# Patient Record
Sex: Female | Born: 1950 | Race: White | Hispanic: No | Marital: Married | Smoking: Current some day smoker
Health system: Southern US, Community
[De-identification: ages and names within clinical notes are randomized; demographics above are authoritative.]

## PROBLEM LIST (undated history)

## (undated) DIAGNOSIS — J9601 Acute respiratory failure with hypoxia: Secondary | ICD-10-CM

## (undated) DIAGNOSIS — M329 Systemic lupus erythematosus, unspecified: Secondary | ICD-10-CM

## (undated) DIAGNOSIS — M542 Cervicalgia: Secondary | ICD-10-CM

## (undated) DIAGNOSIS — Z8679 Personal history of other diseases of the circulatory system: Secondary | ICD-10-CM

## (undated) DIAGNOSIS — K589 Irritable bowel syndrome without diarrhea: Secondary | ICD-10-CM

## (undated) DIAGNOSIS — K760 Fatty (change of) liver, not elsewhere classified: Secondary | ICD-10-CM

## (undated) DIAGNOSIS — K559 Vascular disorder of intestine, unspecified: Secondary | ICD-10-CM

## (undated) DIAGNOSIS — I4891 Unspecified atrial fibrillation: Secondary | ICD-10-CM

## (undated) DIAGNOSIS — J449 Chronic obstructive pulmonary disease, unspecified: Secondary | ICD-10-CM

## (undated) DIAGNOSIS — K644 Residual hemorrhoidal skin tags: Secondary | ICD-10-CM

## (undated) DIAGNOSIS — M1990 Unspecified osteoarthritis, unspecified site: Secondary | ICD-10-CM

## (undated) DIAGNOSIS — I519 Heart disease, unspecified: Secondary | ICD-10-CM

## (undated) DIAGNOSIS — D126 Benign neoplasm of colon, unspecified: Secondary | ICD-10-CM

## (undated) DIAGNOSIS — Z86010 Personal history of colonic polyps: Secondary | ICD-10-CM

## (undated) DIAGNOSIS — I509 Heart failure, unspecified: Secondary | ICD-10-CM

## (undated) DIAGNOSIS — G4730 Sleep apnea, unspecified: Secondary | ICD-10-CM

## (undated) DIAGNOSIS — M810 Age-related osteoporosis without current pathological fracture: Secondary | ICD-10-CM

## (undated) DIAGNOSIS — Z950 Presence of cardiac pacemaker: Secondary | ICD-10-CM

## (undated) DIAGNOSIS — G2581 Restless legs syndrome: Secondary | ICD-10-CM

## (undated) DIAGNOSIS — I83813 Varicose veins of bilateral lower extremities with pain: Secondary | ICD-10-CM

## (undated) HISTORY — DX: Sleep apnea, unspecified: G47.30

## (undated) HISTORY — DX: Benign neoplasm of colon, unspecified: D12.6

## (undated) HISTORY — DX: Age-related osteoporosis without current pathological fracture: M81.0

## (undated) HISTORY — DX: Vascular disorder of intestine, unspecified: K55.9

## (undated) HISTORY — DX: Systemic lupus erythematosus, unspecified: M32.9

## (undated) HISTORY — PX: TONSILLECTOMY: SUR1361

## (undated) HISTORY — DX: Restless legs syndrome: G25.81

## (undated) HISTORY — DX: Fatty (change of) liver, not elsewhere classified: K76.0

## (undated) HISTORY — PX: CHOLECYSTECTOMY, LAPAROSCOPIC: SHX56

## (undated) HISTORY — PX: CHOLECYSTECTOMY: SHX55

## (undated) HISTORY — DX: Personal history of other diseases of the circulatory system: Z86.79

## (undated) HISTORY — PX: OTHER SURGICAL HISTORY: SHX169

## (undated) HISTORY — DX: Residual hemorrhoidal skin tags: K64.4

## (undated) HISTORY — DX: Irritable bowel syndrome, unspecified: K58.9

## (undated) HISTORY — DX: Heart disease, unspecified: I51.9

## (undated) HISTORY — PX: ESOPHAGOGASTRODUODENOSCOPY: SHX1529

## (undated) HISTORY — DX: Personal history of colonic polyps: Z86.010

## (undated) HISTORY — PX: COLONOSCOPY: SHX174

---
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
## 1975-04-23 HISTORY — PX: TUBAL LIGATION: SHX77

## 1999-07-16 ENCOUNTER — Other Ambulatory Visit: Admission: RE | Admit: 1999-07-16 | Discharge: 1999-07-16 | Payer: Self-pay | Admitting: Obstetrics and Gynecology

## 1999-10-11 ENCOUNTER — Other Ambulatory Visit: Admission: RE | Admit: 1999-10-11 | Discharge: 1999-10-11 | Payer: Self-pay | Admitting: Obstetrics and Gynecology

## 1999-10-26 ENCOUNTER — Encounter (INDEPENDENT_AMBULATORY_CARE_PROVIDER_SITE_OTHER): Payer: Self-pay

## 1999-10-26 ENCOUNTER — Ambulatory Visit (HOSPITAL_COMMUNITY): Admission: RE | Admit: 1999-10-26 | Discharge: 1999-10-26 | Payer: Self-pay | Admitting: Obstetrics and Gynecology

## 2000-07-25 ENCOUNTER — Other Ambulatory Visit: Admission: RE | Admit: 2000-07-25 | Discharge: 2000-07-25 | Payer: Self-pay | Admitting: Obstetrics and Gynecology

## 2001-07-25 ENCOUNTER — Emergency Department (HOSPITAL_COMMUNITY): Admission: EM | Admit: 2001-07-25 | Discharge: 2001-07-25 | Payer: Self-pay | Admitting: Emergency Medicine

## 2001-09-25 ENCOUNTER — Other Ambulatory Visit: Admission: RE | Admit: 2001-09-25 | Discharge: 2001-09-25 | Payer: Self-pay | Admitting: Obstetrics and Gynecology

## 2002-06-06 ENCOUNTER — Inpatient Hospital Stay (HOSPITAL_COMMUNITY): Admission: EM | Admit: 2002-06-06 | Discharge: 2002-06-08 | Payer: Self-pay | Admitting: *Deleted

## 2002-06-07 ENCOUNTER — Encounter (INDEPENDENT_AMBULATORY_CARE_PROVIDER_SITE_OTHER): Payer: Self-pay | Admitting: *Deleted

## 2002-07-19 ENCOUNTER — Ambulatory Visit: Admission: RE | Admit: 2002-07-19 | Discharge: 2002-07-19 | Payer: Self-pay | Admitting: Internal Medicine

## 2002-07-19 ENCOUNTER — Encounter: Payer: Self-pay | Admitting: Internal Medicine

## 2002-10-06 ENCOUNTER — Other Ambulatory Visit: Admission: RE | Admit: 2002-10-06 | Discharge: 2002-10-06 | Payer: Self-pay | Admitting: Obstetrics and Gynecology

## 2003-10-11 ENCOUNTER — Other Ambulatory Visit: Admission: RE | Admit: 2003-10-11 | Discharge: 2003-10-11 | Payer: Self-pay | Admitting: Obstetrics and Gynecology

## 2004-03-01 ENCOUNTER — Ambulatory Visit: Payer: Self-pay | Admitting: Internal Medicine

## 2004-03-13 ENCOUNTER — Ambulatory Visit: Payer: Self-pay | Admitting: Internal Medicine

## 2004-03-21 ENCOUNTER — Encounter (INDEPENDENT_AMBULATORY_CARE_PROVIDER_SITE_OTHER): Payer: Self-pay | Admitting: *Deleted

## 2004-03-21 ENCOUNTER — Ambulatory Visit (HOSPITAL_COMMUNITY): Admission: RE | Admit: 2004-03-21 | Discharge: 2004-03-21 | Payer: Self-pay | Admitting: Internal Medicine

## 2004-03-21 ENCOUNTER — Ambulatory Visit: Payer: Self-pay | Admitting: Internal Medicine

## 2004-04-11 ENCOUNTER — Encounter (INDEPENDENT_AMBULATORY_CARE_PROVIDER_SITE_OTHER): Payer: Self-pay | Admitting: *Deleted

## 2004-04-11 ENCOUNTER — Observation Stay (HOSPITAL_COMMUNITY): Admission: RE | Admit: 2004-04-11 | Discharge: 2004-04-12 | Payer: Self-pay | Admitting: General Surgery

## 2004-04-17 ENCOUNTER — Ambulatory Visit: Payer: Self-pay | Admitting: Internal Medicine

## 2004-05-18 ENCOUNTER — Ambulatory Visit: Payer: Self-pay | Admitting: Internal Medicine

## 2004-06-04 ENCOUNTER — Ambulatory Visit: Payer: Self-pay | Admitting: Internal Medicine

## 2004-07-16 ENCOUNTER — Ambulatory Visit: Payer: Self-pay | Admitting: Internal Medicine

## 2004-10-11 ENCOUNTER — Other Ambulatory Visit: Admission: RE | Admit: 2004-10-11 | Discharge: 2004-10-11 | Payer: Self-pay | Admitting: Obstetrics and Gynecology

## 2004-11-01 ENCOUNTER — Ambulatory Visit: Payer: Self-pay | Admitting: Internal Medicine

## 2005-03-21 ENCOUNTER — Ambulatory Visit: Payer: Self-pay | Admitting: Internal Medicine

## 2005-06-26 ENCOUNTER — Ambulatory Visit: Payer: Self-pay | Admitting: Internal Medicine

## 2005-07-29 ENCOUNTER — Ambulatory Visit: Payer: Self-pay | Admitting: Internal Medicine

## 2005-09-23 ENCOUNTER — Ambulatory Visit: Payer: Self-pay | Admitting: Internal Medicine

## 2005-11-11 ENCOUNTER — Other Ambulatory Visit: Admission: RE | Admit: 2005-11-11 | Discharge: 2005-11-11 | Payer: Self-pay | Admitting: Obstetrics and Gynecology

## 2005-12-26 ENCOUNTER — Ambulatory Visit: Payer: Self-pay | Admitting: Internal Medicine

## 2006-01-31 ENCOUNTER — Ambulatory Visit: Payer: Self-pay | Admitting: Internal Medicine

## 2006-05-20 ENCOUNTER — Ambulatory Visit: Payer: Self-pay | Admitting: Internal Medicine

## 2006-11-14 ENCOUNTER — Other Ambulatory Visit: Admission: RE | Admit: 2006-11-14 | Discharge: 2006-11-14 | Payer: Self-pay | Admitting: Obstetrics and Gynecology

## 2006-11-20 ENCOUNTER — Ambulatory Visit: Payer: Self-pay | Admitting: Internal Medicine

## 2006-11-21 LAB — CONVERTED CEMR LAB
Cholesterol: 152 mg/dL (ref 0–200)
HDL: 51.7 mg/dL (ref >39.0)
LDL Cholesterol: 84 mg/dL (ref 0–99)
TSH: 1.25 microintl units/mL (ref 0.35–5.50)
Total CHOL/HDL Ratio: 2.9
Triglycerides: 82 mg/dL (ref 0–149)
VLDL: 16 mg/dL (ref 0–40)

## 2007-01-17 ENCOUNTER — Encounter: Payer: Self-pay | Admitting: *Deleted

## 2007-01-17 DIAGNOSIS — Z8679 Personal history of other diseases of the circulatory system: Secondary | ICD-10-CM | POA: Insufficient documentation

## 2007-01-17 DIAGNOSIS — Z9089 Acquired absence of other organs: Secondary | ICD-10-CM | POA: Insufficient documentation

## 2007-01-29 ENCOUNTER — Ambulatory Visit: Payer: Self-pay | Admitting: Internal Medicine

## 2007-04-29 ENCOUNTER — Telehealth: Payer: Self-pay | Admitting: Internal Medicine

## 2007-06-21 LAB — CONVERTED CEMR LAB: Pap Smear: NORMAL

## 2007-06-30 ENCOUNTER — Ambulatory Visit: Payer: Self-pay | Admitting: Internal Medicine

## 2007-07-14 ENCOUNTER — Ambulatory Visit: Payer: Self-pay | Admitting: Internal Medicine

## 2007-07-14 ENCOUNTER — Encounter: Payer: Self-pay | Admitting: Internal Medicine

## 2007-07-27 ENCOUNTER — Ambulatory Visit: Payer: Self-pay | Admitting: Internal Medicine

## 2007-07-27 DIAGNOSIS — G609 Hereditary and idiopathic neuropathy, unspecified: Secondary | ICD-10-CM | POA: Insufficient documentation

## 2007-07-28 ENCOUNTER — Encounter (INDEPENDENT_AMBULATORY_CARE_PROVIDER_SITE_OTHER): Payer: Self-pay | Admitting: *Deleted

## 2007-08-25 ENCOUNTER — Encounter: Payer: Self-pay | Admitting: Internal Medicine

## 2007-09-17 ENCOUNTER — Telehealth: Payer: Self-pay | Admitting: Internal Medicine

## 2007-09-21 ENCOUNTER — Ambulatory Visit: Payer: Self-pay | Admitting: Internal Medicine

## 2007-11-03 ENCOUNTER — Telehealth: Payer: Self-pay | Admitting: Internal Medicine

## 2007-11-09 ENCOUNTER — Encounter: Payer: Self-pay | Admitting: Internal Medicine

## 2007-11-13 ENCOUNTER — Ambulatory Visit: Payer: Self-pay | Admitting: Internal Medicine

## 2007-11-17 ENCOUNTER — Telehealth: Payer: Self-pay | Admitting: Internal Medicine

## 2007-11-20 ENCOUNTER — Ambulatory Visit: Payer: Self-pay | Admitting: Internal Medicine

## 2007-11-23 ENCOUNTER — Encounter: Payer: Self-pay | Admitting: Internal Medicine

## 2007-11-23 ENCOUNTER — Ambulatory Visit: Payer: Self-pay | Admitting: Internal Medicine

## 2007-11-27 ENCOUNTER — Encounter: Payer: Self-pay | Admitting: Internal Medicine

## 2007-12-21 ENCOUNTER — Encounter: Payer: Self-pay | Admitting: Internal Medicine

## 2008-01-08 ENCOUNTER — Telehealth (INDEPENDENT_AMBULATORY_CARE_PROVIDER_SITE_OTHER): Payer: Self-pay | Admitting: *Deleted

## 2008-01-22 ENCOUNTER — Ambulatory Visit: Payer: Self-pay | Admitting: Internal Medicine

## 2008-03-25 ENCOUNTER — Ambulatory Visit: Payer: Self-pay | Admitting: Internal Medicine

## 2008-03-25 DIAGNOSIS — Z860100 Personal history of colon polyps, unspecified: Secondary | ICD-10-CM

## 2008-03-25 DIAGNOSIS — K589 Irritable bowel syndrome without diarrhea: Secondary | ICD-10-CM

## 2008-03-25 DIAGNOSIS — J069 Acute upper respiratory infection, unspecified: Secondary | ICD-10-CM | POA: Insufficient documentation

## 2008-03-25 HISTORY — DX: Personal history of colon polyps, unspecified: Z86.0100

## 2008-03-25 HISTORY — DX: Personal history of colonic polyps: Z86.010

## 2008-03-28 ENCOUNTER — Telehealth: Payer: Self-pay | Admitting: Internal Medicine

## 2008-04-11 ENCOUNTER — Ambulatory Visit: Payer: Self-pay | Admitting: Internal Medicine

## 2008-04-18 ENCOUNTER — Telehealth: Payer: Self-pay | Admitting: Internal Medicine

## 2008-04-29 ENCOUNTER — Telehealth: Payer: Self-pay | Admitting: Internal Medicine

## 2008-05-10 ENCOUNTER — Telehealth: Payer: Self-pay | Admitting: Internal Medicine

## 2008-05-30 ENCOUNTER — Telehealth: Payer: Self-pay | Admitting: Internal Medicine

## 2008-05-31 ENCOUNTER — Ambulatory Visit: Payer: Self-pay | Admitting: Internal Medicine

## 2008-05-31 DIAGNOSIS — R21 Rash and other nonspecific skin eruption: Secondary | ICD-10-CM | POA: Insufficient documentation

## 2008-05-31 DIAGNOSIS — M549 Dorsalgia, unspecified: Secondary | ICD-10-CM | POA: Insufficient documentation

## 2008-06-02 ENCOUNTER — Telehealth (INDEPENDENT_AMBULATORY_CARE_PROVIDER_SITE_OTHER): Payer: Self-pay | Admitting: *Deleted

## 2008-06-03 ENCOUNTER — Encounter: Admission: RE | Admit: 2008-06-03 | Discharge: 2008-06-03 | Payer: Self-pay | Admitting: Internal Medicine

## 2008-06-03 ENCOUNTER — Encounter: Payer: Self-pay | Admitting: Internal Medicine

## 2008-06-03 DIAGNOSIS — R9389 Abnormal findings on diagnostic imaging of other specified body structures: Secondary | ICD-10-CM

## 2008-06-07 ENCOUNTER — Ambulatory Visit: Payer: Self-pay | Admitting: Oncology

## 2008-06-16 ENCOUNTER — Telehealth (INDEPENDENT_AMBULATORY_CARE_PROVIDER_SITE_OTHER): Payer: Self-pay | Admitting: *Deleted

## 2008-06-24 ENCOUNTER — Encounter: Payer: Self-pay | Admitting: Internal Medicine

## 2008-06-24 LAB — CBC & DIFF AND RETIC
Basophils Absolute: 0 10*3/uL (ref 0.0–0.1)
Eosinophils Absolute: 0 10*3/uL (ref 0.0–0.5)
HCT: 36.5 % (ref 34.8–46.6)
HGB: 12.5 g/dL (ref 11.6–15.9)
IRF: 0.27 (ref 0.130–0.330)
MCH: 30.8 pg (ref 25.1–34.0)
MONO#: 0.6 10*3/uL (ref 0.1–0.9)
NEUT%: 57.3 % (ref 38.4–76.8)
WBC: 5.5 10*3/uL (ref 3.9–10.3)
lymph#: 1.7 10*3/uL (ref 0.9–3.3)

## 2008-06-24 LAB — LACTATE DEHYDROGENASE: LDH: 146 U/L (ref 94–250)

## 2008-06-24 LAB — CHCC SMEAR

## 2008-06-24 LAB — COMPREHENSIVE METABOLIC PANEL
ALT: 20 U/L (ref 0–35)
CO2: 29 mEq/L (ref 19–32)
Calcium: 9.2 mg/dL (ref 8.4–10.5)
Chloride: 102 mEq/L (ref 96–112)
Potassium: 3.8 mEq/L (ref 3.5–5.3)
Sodium: 137 mEq/L (ref 135–145)
Total Protein: 7.2 g/dL (ref 6.0–8.3)

## 2008-07-04 ENCOUNTER — Ambulatory Visit (HOSPITAL_COMMUNITY): Admission: RE | Admit: 2008-07-04 | Discharge: 2008-07-04 | Payer: Self-pay | Admitting: Oncology

## 2008-09-21 ENCOUNTER — Ambulatory Visit: Payer: Self-pay | Admitting: Oncology

## 2008-09-23 ENCOUNTER — Encounter: Payer: Self-pay | Admitting: Internal Medicine

## 2008-09-23 LAB — COMPREHENSIVE METABOLIC PANEL
ALT: 22 U/L (ref 0–35)
AST: 26 U/L (ref 0–37)
Albumin: 4.3 g/dL (ref 3.5–5.2)
Alkaline Phosphatase: 66 U/L (ref 39–117)
BUN: 12 mg/dL (ref 6–23)
CO2: 27 mEq/L (ref 19–32)
Calcium: 9.4 mg/dL (ref 8.4–10.5)
Chloride: 103 mEq/L (ref 96–112)
Creatinine, Ser: 0.74 mg/dL (ref 0.40–1.20)
Glucose, Bld: 98 mg/dL (ref 70–99)
Potassium: 3.7 mEq/L (ref 3.5–5.3)
Sodium: 139 mEq/L (ref 135–145)
Total Bilirubin: 0.4 mg/dL (ref 0.3–1.2)
Total Protein: 7.5 g/dL (ref 6.0–8.3)

## 2008-09-23 LAB — CBC & DIFF AND RETIC
BASO%: 0.4 % (ref 0.0–2.0)
Basophils Absolute: 0 10*3/uL (ref 0.0–0.1)
EOS%: 1.2 % (ref 0.0–7.0)
Eosinophils Absolute: 0.1 10*3/uL (ref 0.0–0.5)
HCT: 36.7 % (ref 34.8–46.6)
HGB: 12.7 g/dL (ref 11.6–15.9)
IRF: 0.24 (ref 0.130–0.330)
LYMPH%: 33.2 % (ref 14.0–49.7)
MCH: 31.7 pg (ref 25.1–34.0)
MCHC: 34.6 g/dL (ref 31.5–36.0)
MCV: 91.8 fL (ref 79.5–101.0)
MONO#: 0.6 10*3/uL (ref 0.1–0.9)
MONO%: 9.8 % (ref 0.0–14.0)
NEUT#: 3.3 10*3/uL (ref 1.5–6.5)
NEUT%: 55.4 % (ref 38.4–76.8)
Platelets: 170 10*3/uL (ref 145–400)
RBC: 4 10*6/uL (ref 3.70–5.45)
RDW: 11.8 % (ref 11.2–14.5)
RETIC #: 48 10*3/uL (ref 19.7–115.1)
Retic %: 1.2 % (ref 0.4–2.3)
WBC: 5.9 10*3/uL (ref 3.9–10.3)
lymph#: 2 10*3/uL (ref 0.9–3.3)

## 2008-09-23 LAB — LACTATE DEHYDROGENASE: LDH: 178 U/L (ref 94–250)

## 2008-09-23 LAB — CHCC SMEAR

## 2008-09-23 LAB — ERYTHROCYTE SEDIMENTATION RATE: Sed Rate: 16 mm/hr (ref 0–30)

## 2008-12-27 ENCOUNTER — Encounter: Admission: RE | Admit: 2008-12-27 | Discharge: 2008-12-27 | Payer: Self-pay | Admitting: Oncology

## 2009-01-02 ENCOUNTER — Ambulatory Visit: Payer: Self-pay | Admitting: Internal Medicine

## 2009-01-02 DIAGNOSIS — J209 Acute bronchitis, unspecified: Secondary | ICD-10-CM

## 2009-01-02 DIAGNOSIS — Z720 Tobacco use: Secondary | ICD-10-CM | POA: Insufficient documentation

## 2009-01-02 DIAGNOSIS — F17200 Nicotine dependence, unspecified, uncomplicated: Secondary | ICD-10-CM

## 2009-01-04 ENCOUNTER — Telehealth: Payer: Self-pay | Admitting: Internal Medicine

## 2009-01-11 ENCOUNTER — Ambulatory Visit: Payer: Self-pay | Admitting: Oncology

## 2009-01-13 ENCOUNTER — Encounter: Payer: Self-pay | Admitting: Internal Medicine

## 2009-01-13 LAB — COMPREHENSIVE METABOLIC PANEL
ALT: 16 U/L (ref 0–35)
AST: 25 U/L (ref 0–37)
Albumin: 4.2 g/dL (ref 3.5–5.2)
Alkaline Phosphatase: 65 U/L (ref 39–117)
BUN: 13 mg/dL (ref 6–23)
CO2: 25 mEq/L (ref 19–32)
Calcium: 9.2 mg/dL (ref 8.4–10.5)
Chloride: 101 mEq/L (ref 96–112)
Creatinine, Ser: 0.67 mg/dL (ref 0.40–1.20)
Glucose, Bld: 99 mg/dL (ref 70–99)
Potassium: 4.8 mEq/L (ref 3.5–5.3)
Sodium: 133 mEq/L — ABNORMAL LOW (ref 135–145)
Total Bilirubin: 0.6 mg/dL (ref 0.3–1.2)
Total Protein: 7.1 g/dL (ref 6.0–8.3)

## 2009-01-13 LAB — LACTATE DEHYDROGENASE: LDH: 151 U/L (ref 94–250)

## 2009-01-13 LAB — CBC & DIFF AND RETIC
BASO%: 0.3 % (ref 0.0–2.0)
Basophils Absolute: 0 10*3/uL (ref 0.0–0.1)
EOS%: 1 % (ref 0.0–7.0)
Eosinophils Absolute: 0.1 10*3/uL (ref 0.0–0.5)
HCT: 37.5 % (ref 34.8–46.6)
HGB: 13.1 g/dL (ref 11.6–15.9)
Immature Retic Fract: 1.1 % (ref 0.00–10.70)
LYMPH%: 37.4 % (ref 14.0–49.7)
MCH: 30.1 pg (ref 25.1–34.0)
MCHC: 34.9 g/dL (ref 31.5–36.0)
MCV: 86.2 fL (ref 79.5–101.0)
MONO#: 0.7 10*3/uL (ref 0.1–0.9)
MONO%: 11.2 % (ref 0.0–14.0)
NEUT#: 3 10*3/uL (ref 1.5–6.5)
NEUT%: 50.1 % (ref 38.4–76.8)
Platelets: 181 10*3/uL (ref 145–400)
RBC: 4.35 10*6/uL (ref 3.70–5.45)
RDW: 12.2 % (ref 11.2–14.5)
Retic %: 0.53 % (ref 0.50–1.50)
Retic Ct Abs: 23.06 10*3/uL (ref 18.30–72.70)
WBC: 6 10*3/uL (ref 3.9–10.3)
lymph#: 2.2 10*3/uL (ref 0.9–3.3)

## 2009-01-13 LAB — ERYTHROCYTE SEDIMENTATION RATE: Sed Rate: 20 mm/hr (ref 0–30)

## 2009-01-13 LAB — CHCC SMEAR

## 2009-01-17 ENCOUNTER — Ambulatory Visit: Payer: Self-pay | Admitting: Internal Medicine

## 2009-01-17 ENCOUNTER — Telehealth: Payer: Self-pay | Admitting: Internal Medicine

## 2009-01-18 ENCOUNTER — Ambulatory Visit: Payer: Self-pay | Admitting: Internal Medicine

## 2009-01-18 DIAGNOSIS — K219 Gastro-esophageal reflux disease without esophagitis: Secondary | ICD-10-CM

## 2009-01-18 DIAGNOSIS — R05 Cough: Secondary | ICD-10-CM

## 2009-01-18 DIAGNOSIS — R059 Cough, unspecified: Secondary | ICD-10-CM | POA: Insufficient documentation

## 2009-02-10 ENCOUNTER — Telehealth: Payer: Self-pay | Admitting: Internal Medicine

## 2009-04-07 ENCOUNTER — Encounter: Payer: Self-pay | Admitting: Internal Medicine

## 2009-09-27 ENCOUNTER — Encounter: Payer: Self-pay | Admitting: Internal Medicine

## 2010-01-30 ENCOUNTER — Encounter: Payer: Self-pay | Admitting: Internal Medicine

## 2010-05-13 ENCOUNTER — Encounter (HOSPITAL_COMMUNITY): Payer: Self-pay | Admitting: Oncology

## 2010-05-24 NOTE — Letter (Signed)
Summary: [facility]   Imported By: Sherian Rein 02/02/2010 11:47:33  _____________________________________________________________________  External Attachment:    Type:   Image     Comment:   External Document

## 2010-05-24 NOTE — Letter (Signed)
Summary: [facility]   Imported By: Lester Welaka 10/03/2009 12:32:01  _____________________________________________________________________  External Attachment:    Type:   Image     Comment:   External Document

## 2010-09-07 NOTE — Op Note (Signed)
Sheila Morales, Sheila Morales                  ACCOUNT NO.:  [id number]   MEDICAL RECORD NO.:  [id number]          PATIENT TYPE:  OBV   LOCATION:  0357                         FACILITY:  [facility]   PHYSICIAN:  Ollen Gross. Vernell Morgans, M.D. DATE OF BIRTH:  12-12-1950   DATE OF PROCEDURE:  04/11/2004  DATE OF DISCHARGE:                                 OPERATIVE REPORT   PREOPERATIVE DIAGNOSIS:  Gallstones.   POSTOPERATIVE DIAGNOSIS:  Gallstones.   PROCEDURE:  Laparoscopic cholecystectomy with intraoperative cholangiogram.   SURGEON:  Ollen Gross. Carolynne Edouard, M.D.   ASSISTANT:  Currie Paris, M.D.   ANESTHESIA:  General endotracheal.   DESCRIPTION OF PROCEDURE:  After informed consent was obtained, the patient  was brought to the operating room and placed in the supine position on the  operating room table.  After adequate induction of general anesthesia, the  patient's abdomen was prepped with Betadine and draped in the usual sterile  manner.  The area below the umbilicus was infiltrated with 0.25% Marcaine.   A small incision was made with a 15 blade knife.  This incision was carried  down through the subcutaneous tissue bluntly with the Kelly clamps and Army-  Navy retractors until the linea alba was identified.  The linea alba was  incised with the 15 blade knife, and each side was grasped with Kocher  clamps and elevated anteriorly.  The preperitoneal space was entered bluntly  with the hemostat until the peritoneum was opened and access was gained to  the abdominal cavity.  A 0 Vicryl pursestring stitch was placed on the  fascia surrounding the opening.  An Hasson cannula was placed through the  opening and anchored in place with a 3-0 Vicryl pursestring stitch.  The  abdomen was then insufflated with carbon dioxide without difficulty.  The  patient was placed in head up position and rotated slightly with the right  side up.  The laparoscope was placed through the Hasson cannula, and the  right  upper quadrant was inspected.  The dome of the gallbladder and liver  were readily identified.  Next, the epigastric region was infiltrated with  0.25% Marcaine.  A small incision was made with a 15 blade knife.  A 10-mm  port was then placed bluntly through this incision into the abdominal cavity  under direct vision.  Sites were then chosen laterally on the right side of  the abdomen for the placement of 5-mm ports.  Each of these areas was  infiltrated with 0.25% Marcaine.  Small stab incisions were made with the 15  blade knife, and 5-mm ports were placed bluntly through these incisions into  the abdominal cavity under direct vision.  Next, a blunt grasper was placed  through the lateral-most 5-mm port and used to grasp the dome of the  gallbladder and elevate it anteriorly and superiorly.  Another blunt grasper  was placed through the other 5-mm port and used to retract on the body and  neck of the gallbladder.  A dissector was placed through the epigastric  port, and using the electrocautery, the  peritoneal reflection at the  gallbladder neck area was opened.  Blunt dissection was then carried out in  this area until the gallbladder neck/cystic duct junction was readily  identified and a good window was created.  A clip was placed on the  gallbladder neck.  A small ductotomy was made just below the clip with the  laparoscopic scissors.  A 14-gauge Angiocath was then placed percutaneously  through the anterior abdominal wall.  A Reddick cholangiogram catheter was  then placed through the Angiocath and flushed.  The Reddick catheter was  placed within the cystic duct and anchored in place with a clip.  A  cholangiogram was obtained that showed no filling defects, good emptying  into the duodenum, and adequate length on the cystic duct.  The anchoring  clip and catheters were removed from the patient.  Three clips were placed  proximally on the cystic duct, and the duct was divided  between the two sets  of clips.  Posterior to this, the cystic artery was identified and dissected  bluntly in a circumferential manner until a good window was created.  Two  clips were placed proximally and one distally on the artery, and the artery  was divided between the two.  Next, the laparoscopic cautery device was used  to separate the gallbladder from the liver bed.  Prior to completely  detaching the gallbladder from the liver bed, the liver bed was inspected.  Several small bleeding points were coagulated with the electrocautery until  the area was completely hemostatic.  The laparoscope was then moved to the  epigastric port, and the gallbladder grasper was placed through the Hasson  cannula and used to grasp the neck of the gallbladder.  The gallbladder with  the Hasson cannula was then removed through the infraumbilical port without  difficulty.  The fascial defect was closed with the previously placed Vicryl  pursestring stitch as well as another interrupted 0 Vicryl stitch.  The  abdomen was then irrigated with copious amounts of saline until the affluent  was clear.  The ports were then all removed under direct vision and found to  be hemostatic.  The gas was allowed to escape.  The skin incisions were all  closed with interrupted 4-0 Monocryl subcuticular stitches.  Benzoin and  Steri-Strips were applied.   The patient tolerated the procedure well.  At the end of the case, all  needle, sponge, and instrument counts were correct.  The patient was then  awakened and taken to the recovery room in stable condition.     Renae Fickle   PST/MEDQ  D:  04/18/2004  T:  04/18/2004  Job:  [id number]

## 2010-09-07 NOTE — H&P (Signed)
[facility]  Patient:    Sheila Morales, Sheila Morales                           MRN: [id number] Adm. Date:  10/27/99 Attending:  Fayrene Fearing A. Ashley Royalty, M.D.                         History and Physical  HISTORY OF PRESENT ILLNESS:   This is a 60 year old gravida 3, para 3, who presented to the office October 11, 1999 and at that time was noted to have pigmented lesions in the area of her previous partial vulvectomy for VIN-3, February 1996; she is hence for wide local excision and possible punch biopsies as well.  MEDICATIONS:                  1. Estrace 1 mg daily.                               2. Provera 5 mg, days 1 through 12 each month.  PAST MEDICAL HISTORY:         Rheumatic fever.  PAST SURGICAL HISTORY:        Partial vulvectomy, February 1996.  BTSP.  ALLERGIES:                    None.  FAMILY HISTORY:               Family history is negative for ovarian, colon, breast or uterine cancer in first degree relatives.  SOCIAL HISTORY:               Patient has an approximately 21-pack-year smoking history.  She denies significant use of alcohol.  REVIEW OF SYSTEMS:            Noncontributory.  PHYSICAL EXAMINATION:  GENERAL:                      Patient is a well-developed, well-nourished, pleasant white female in no acute distress.  SKIN:                         Warm and dry without lesions.  LYMPH:                        There is no supraclavicular, cervical or inguinal adenopathy.  HEENT:                        Normocephalic.  NECK:                         Supple without thyromegaly.  CHEST:                        Lungs are clear.  CARDIAC:                      Regular rate and rhythm without murmurs, gallops or rubs.  BREASTS:                      Soft and without palpable mass, discharge, retraction or adenopathy.  ABDOMEN:  Soft and nontender, without masses or organomegaly.  Bowel sounds are active.  MUSCULOSKELETAL:               Full range of motion, without edema, cyanosis or CVA tenderness.  PELVIC:                       Examination revealed the perineal area to contain approximately a 1.5 x 1.5-cm scar which contains several pigmented areas, too large for punch biopsy; in addition, both labia minora contain several pigmented areas as well; the latter appear benign.  Vagina and cervix are without gross lesions.  Bimanual examination revealed the uterus to be approximately 8 x 4 x 4 cm.  No adnexal masses are palpable.  IMPRESSION:                   1. Status post partial vulvectomy, February                                  1996, for vulvar carcinoma in situ, grade 3.                               2. History of atypical Pap smear with normal                                  repeat.                               3. Pigmented lesions in the area of the surgical                                  scar from February 1996, partial vulvectomy,                                  and pigmentations contained within the labia                                  minora bilaterally.  PLAN:                         Arrange wide local excision (partial vulvectomy) as well as punch biopsies.  Risks, benefits, complications and alternatives are fully discussed with the patient; she states she understands and accepts. Questions invited and answered. DD:  10/25/99 TD:  10/26/99 Job: [id number]

## 2010-09-07 NOTE — H&P (Signed)
NAME:  Sheila Morales, Sheila Morales                            ACCOUNT NO.:  [id number]   MEDICAL RECORD NO.:  [id number]                   PATIENT TYPE:  INP   LOCATION:  1825                                 FACILITY:  [facility]   PHYSICIAN:  Sean A. Everardo All, M.D. [facility]           DATE OF BIRTH:  Oct 30, 1950   DATE OF ADMISSION:  06/06/2002  DATE OF DISCHARGE:                                HISTORY & PHYSICAL   REASON FOR ADMISSION:  Rectal bleeding.   HISTORY OF PRESENT ILLNESS:  The patient is a 60 year old woman with one day  of intermittent pain at the suprapubic and left lower quadrant area of the  abdomen.  It was associated with moderate dizziness and a slight amount of  rectal bleeding mixed with stool.  She also had one episode of vomiting.   PAST MEDICAL HISTORY:  Negative for the above.  She is a cigarette smoker,  and she is menopausal.   MEDICATIONS:  An estrogen supplement that she does not know the nature of.   SOCIAL HISTORY:  The patient is married.  She works for a [employer].   FAMILY HISTORY:  Negative for GI problems.   REVIEW OF SYSTEMS:  Denies the following - diarrhea, loss of consciousness,  skin rash, chest pain, shortness of breath, fever, dysuria, sore throat,  earache, and myalgias.   PHYSICAL EXAMINATION:  VITAL SIGNS:  Blood pressure 144/62, heart rate 78,  respiratory rate 16.  The patient is afebrile.  No postural change in blood  pressure.  GENERAL:  In no distress.  SKIN:  No diaphoretic.  HEENT:  Head is atraumatic.  Sclerae are anicteric.  Pharynx clear.  NECK:  Supple.  CHEST:  Clear to auscultation.  CARDIOVASCULAR:  No JVD, no edema.  Regular rate and rhythm.  No murmur.  Pedal pulses are intact.  ABDOMEN:  Soft.  There is minimal left lower quadrant tenderness.  No  hepatosplenomegaly.  No mass.  No distended.  RECTAL:  Externally no lesion is seen.  EXTREMITIES:  No ulcers present on the feet.  The feet are normal color and  temperature.  BREASTS/GYNECOLOGIC EXAMINATION:  Not done at this time due to patient  condition.   LABORATORY STUDIES:  Remarkable for hemoglobin of 16.   IMPRESSION:  1. Rectal bleeding and abdominal pain of uncertain etiology.  2. Cigarette smoker.  3. Menopausal.   PLAN:  1. Hold estrogen supplement, as the patient will mostly be at bed rest.  2. Intravenous fluid.  3. Recheck CBC.  4. Consult GI.  5. Prep for probable colonoscopy tomorrow.                                               Sean A. Everardo All, M.D. [facility]    SAE/MEDQ  D:  06/06/2002  T:  06/06/2002  Job:  [id number]

## 2010-09-07 NOTE — Consult Note (Signed)
NAME:  Sheila Morales, Sheila Morales                            ACCOUNT NO.:  [id number]   MEDICAL RECORD NO.:  [id number]                   PATIENT TYPE:  INP   LOCATION:  5737                                 FACILITY:  [facility]   PHYSICIAN:  Iva Boop, M.D. [facility]           DATE OF BIRTH:  31-May-1950   DATE OF CONSULTATION:  06/06/2002  DATE OF DISCHARGE:                                   CONSULTATION   PRIMARY CARE PHYSICIAN:  Rosalyn Gess. Norins, M.D.   REASON FOR CONSULTATION:  Gastrointestinal bleeding.   HISTORY:  This is a 60 year old white woman who was out last night, had a  meal, and went to a bar.  She had had some glasses of wine and started to  drink a beer early in the morning hours.  After a few sips of the beer, she  developed severe suprapubic cramps.  She went to the bathroom and did not  really produce much of a bowel movement.  She says she cannot remember if  there was any blood in that.  She drank some coke after that episode and had  some nausea and vomiting with no blood in that.  She was cold and clammy  after trying to defecate at that point.  She felt very weak.  She went home,  felt a little better, and continued to have cramps, and then eventually  starting passing bright red blood per rectum.  She went to the emergency  department for evaluation, and she has had several more, at least six more  bloody bowel movements since that time.  She will develop crampy abdominal  pain and then pass the blood.  There was no antecedent diarrhea.  She does  use some nonsteroidals off and on, but takes Aleve only about twice a month.  Recently, she had been on Sudafed, but about two weeks ago, she switched  over to Tylenol Cold, and she has been using some cough medicine for upper  respiratory symptoms.  She has no other GI history noted.  There is no  family history of gastrointestinal problems reported.  She has a past  medical history notable for some pigmented lesions of the vulva  resected by  Dr. Sylvester Harder.  There were no other past medical problems.  She does  take hormone supplementation.   REVIEW OF SYSTEMS:  Notable for that she was somewhat lightheaded as noted  and felt weak.  At this point, she is sleepy after receiving Phenergan and  some pain medicine.  All other systems are negative at this time.   SOCIAL HISTORY:  The patient does consume alcohol, apparently not a smoker.  She is here with her husband whom I performed colonoscopy on a couple of  weeks ago.   PHYSICAL EXAMINATION:  GENERAL:  A sleepy but arousible middle-aged white  woman in no acute distress.  She does appear mildly ill.  VITAL SIGNS:  Blood pressure 144/62, pulse 78, respirations 16.  She is  afebrile. There is no significant change supine to sitting with those vital  signs.  HEENT:  Eyes are anicteric.  The mouth shows relatively moist mucous  membranes, free of lesions.  NECK:  Supple, no mass or thyromegaly.  CHEST:  Clear.  HEART:  S1 and S2, no rubs, murmurs, or gallops.  ABDOMEN:  Soft.  Bowel sounds are present.  At this point, it is nontender  without organomegaly or mass.  RECTAL EXAM:  Deferred at this time.  LYMPH NODES:  No neck, supraclavicular, or inguinal adenopathy palpated.  EXTREMITIES:  Free of edema, cyanosis, or clubbing.  SKIN:  No rash.  MENTAL STATUS:  She is awake and alert.  NEUROLOGIC EXAM:  Nonfocal.   LABORATORY DATA:  Presenting hemoglobin is 12.9, hematocrit 37, white count  12.6, MCV 87, platelets 231,000.  Pro time is normal at 12.6.  A urinalysis  is negative.  She had a wet prep of vaginal secretions that was negative,  and her CMET is normal including BUN and creatinine, other than SGPT 51 and  glucose 137.   ASSESSMENT:  Gastrointestinal bleeding with hematochezia and suprapubic  pain.  There is recent history of over-the-counter cold medication which is  a risk factor for ischemic colitis which she may have.  It is interesting   that her abdomen is nontender at this time.  However, usually would expect  some tenderness.  Diverticular bleeding, bleeding from a polyp, bleeding  from colitis are possible as well.  I do not think she is having an upper GI  bleed, though that is a remote possibility.  The BUN/creatinine ratio and  the quality of the bleeding; i.e., bright bleed, and the overall symptoms go  against that.   PLAN:  Prep for colonoscopy with possible upper endoscopy tomorrow.  Continue IVs, transfuse if needed, serial hemoglobins as ordered by Dr.  Everardo All.  Risks, benefits, indications of colonoscopy and endoscopy are  explained to the patient and her husband, and they agree to proceed.   I appreciate the opportunity to care for this patient.                                               Iva Boop, M.D. [facility]    CEG/MEDQ  D:  06/06/2002  T:  06/06/2002  Job:  [id number]   cc:   Rosalyn Gess. Norins, M.D. [facility]

## 2010-09-07 NOTE — Discharge Summary (Signed)
NAME:  Sheila Morales, Sheila Morales                            ACCOUNT NO.:  [id number]   MEDICAL RECORD NO.:  [id number]                   PATIENT TYPE:  INP   LOCATION:  5737                                 FACILITY:  [facility]   PHYSICIAN:  Rosalyn Gess. Norins, M.D. [facility]         DATE OF BIRTH:  Oct 14, 1950   DATE OF ADMISSION:  06/06/2002  DATE OF DISCHARGE:  06/08/2002                                 DISCHARGE SUMMARY   ADMISSION DIAGNOSIS:  Lower gastrointestinal bleed.   DISCHARGE DIAGNOSIS:  Ischemic colitis with gastrointestinal bleed.   HISTORY OF PRESENT ILLNESS:  The patient is a 60 year old woman who went out  to dinner on Saturday night.  That evening, she did have a painful bowel  movement and had an episode of near-syncope with diaphoresis and pallor.  She went home and felt better but continued to have cramping.  During the  night, she had a significant amount of pain and had frank hematochezia.  She  came to the [facility] emergency department for evaluation and had at least  six more blood bowel movements.  She also had severe crampy abdominal pain.  The patient has no prior history of GI problems.  Of note, the patient has  had a history of pigmented lesions of the vulva resected by Dr. Sylvester Harder.  She has been on hormone replacement therapy.   PAST MEDICAL HISTORY:  Documented in Dr. George Hugh admit note.   FAMILY HISTORY:  Documented in Dr. George Hugh admit note.   SOCIAL HISTORY:  Documented in Dr. George Hugh admit note.   PHYSICAL EXAMINATION:  VITAL SIGNS:  Blood pressure 144/62, heart rate 78,  respirations 16.  ABDOMEN:  Soft with minimal left lower quadrant tenderness.   HOSPITAL COURSE:  The patient was admitted to telemetry unit.  She had IV  access established.  The patient was seen in consultation by Dr. Stan Head for GI.  The patient did have a recent history of over-the-counter  cold medication which may have contained antihistamines which could have  caused  vasoconstriction.  Also, the role of hormone replacement therapy is  raised.   The patient did well.  Her bleeding stopped.  Her hemoglobin stabilized.  Her admitting hemoglobin was 18 which on repeat was 12.9 and then dropped to  a nadir of 10.7.  The patient did not require a transfusion.   The patient was taken for colonoscopy on 06/07/2002 and was found to have  extensive ischemic colitis basically in the ascending transverse and part of  the descending colon.   My patient had no further episodes of rectal bleeding.  Her abdominal pain  subsided.  The patient did tolerate a clear liquid diet on the night of  06/07/2002.  She has not yet had breakfast.  If the patient tolerates her  morning meal, she is ready for discharge home.   LABORATORY DATA:  Hemoglobin as of 06/08/2002  was 10.7, hematocrit 31.2,  white count 8400, platelet count unremarkable.  The patient did have a GC CT  probe 06/06/2002 and this was negative.  The patient's chemistries from  06/06/2002 revealed a sodium of 138, potassium 4.1, chloride 109, CO2 25,  glucose 137, BUN 8, creatinine 0.6, calcium 8.7, total protein 7.4.  AST 37,  ALT 52, total bilirubin 0.5.  Urinalysis was negative.  Electrocardiogram  revealed sinus bradycardia and otherwise was normal.   DISCHARGE MEDICATIONS:  The patient is to continue on all of her home  medications, except she is to discontinue hormone replacement therapy at  this time.   CONDITION ON DISCHARGE:  Stable and improved.                                               Rosalyn Gess Norins, M.D. [facility]    MEN/MEDQ  D:  06/08/2002  T:  06/08/2002  Job:  [id number]   cc:   Iva Boop, M.D. [facility] A. Ashley Royalty, M.D.  [address]  [place], [place] [address]  Fax: [phone]

## 2010-09-07 NOTE — Op Note (Signed)
[facility]  Patient:    Sheila Morales, Sheila Morales                         MRN: [id number] Adm. Date:  [id number] Attending:  Wandalee Ferdinand                           Operative Report  PREOPERATIVE DIAGNOSES:       1. Status post partial vulvectomy in 1996 for                                  VIN III.                               2. Pigmented lesions of the vulva including                                  the posterior fourchette (region of the                                  previous partial vulvectomy) as well as                                  labia minora bilaterally.  POSTOPERATIVE DIAGNOSIS:  OPERATION:                    1. Wide local excision of the vulva (partial                                  vulvectomy).                               2. Punch biopsies of the medial aspects of                                  the left and right labia minora.  SURGEON:                      Rudy Jew. Ashley Royalty, M.D.  ASSISTANT:  ANESTHESIA:                   Monitored anesthesia care with 1% xylocaine paracervical block.  ESTIMATED BLOOD LOSS:         Less than 25 cc.  COMPLICATIONS:                None.  PACKS/DRAINS:                 None.  DESCRIPTION OF PROCEDURE:     The patient was taken to the operating room, placed in the dorsal supine position after adequate IV sedation was administered.  She was placed in the lithotomy position and prepped and draped correctly in the usual manner for vaginal surgery.  Approximately 1 x 2 cm area of the posterior fourchette in the region of the previous partial vulvectomy was identified  with several punctate pigmented areas contained within it.  Decision was made to make an elliptical incision horizontally along the posterior fourchette in order to avoid tension on the incision line during sex.  The area was infiltrated with 1% xylocaine and an elliptical incision made without difficulty, resulting in a  complete excisional biopsy of the entire area containing pigmented lesions.  This specimen was submitted to pathology for histologic studies.  Dead space was closed with 2-0 Vicryl in an interrupted fashion.  The skin was closed with 2-0 chromic in a subcuticular fashion.  Hemostasis was noted.  Next, the labia minora were inspected bilaterally. There were several small pigmented areas that were consistent with lentiginous noted.  However, in the contents of the patients VIN III decision was made to obtain representative samples to corroborate this clinical impression.  Hence, punch biopsies were obtained from the medial aspect of the labia minora.  A total of two biopsies were obtained and submitted separately for histologic studies.  Hemostasis was easily obtained with the Bovie.  At this point, the entire procedure was felt to have been completed.  Hemostasis was noted and the patient taken to the recovery room in excellent condition.  ESTIMATED BLOOD LOSS:  INDICATIONS:  DESCRIPTION OF PROCEDURE: DD:  10/26/99 TD:  10/27/99 Job: [id number]

## 2011-04-04 ENCOUNTER — Other Ambulatory Visit: Payer: Self-pay | Admitting: *Deleted

## 2011-04-04 ENCOUNTER — Encounter: Payer: Self-pay | Admitting: Internal Medicine

## 2011-04-04 ENCOUNTER — Ambulatory Visit (INDEPENDENT_AMBULATORY_CARE_PROVIDER_SITE_OTHER): Payer: PRIVATE HEALTH INSURANCE | Admitting: Internal Medicine

## 2011-04-04 DIAGNOSIS — J209 Acute bronchitis, unspecified: Secondary | ICD-10-CM

## 2011-04-04 DIAGNOSIS — F17200 Nicotine dependence, unspecified, uncomplicated: Secondary | ICD-10-CM

## 2011-04-04 MED ORDER — ZOLPIDEM TARTRATE 10 MG PO TABS: 10.0000 mg | ORAL_TABLET | Freq: Every evening | ORAL | Status: DC | PRN

## 2011-04-04 MED ORDER — SULFAMETHOXAZOLE-TMP DS 800-160 MG PO TABS: 1.0000 | ORAL_TABLET | Freq: Two times a day (BID) | ORAL | Status: AC

## 2011-04-04 MED ORDER — PROMETHAZINE-CODEINE 6.25-10 MG/5ML PO SYRP: 5.0000 mL | ORAL_SOLUTION | ORAL | Status: AC | PRN

## 2011-04-04 NOTE — Patient Instructions (Signed)
Smoker's bronchitis - plan septra DS twice a day for 7 days (antibiotic); promethazine with codeine 1 tsp every 4-6 hours, 2 tsp at bedtime; lots of fluids.  Tobacco abuse - please STOP SMOKING- it is BAD for you.   Acute Bronchitis You have acute bronchitis. This means you have a chest cold. The airways in your lungs are red and sore (inflamed). Acute means it is sudden onset.   CAUSES Bronchitis is most often caused by the same virus that causes a cold. SYMPTOMS    Body aches.     Chest congestion.     Chills.    Cough.    Fever.    Shortness of breath.     Sore throat.  TREATMENT   Acute bronchitis is usually treated with rest, fluids, and medicines for relief of fever or cough. Most symptoms should go away after a few days or a week. Increased fluids may help thin your secretions and will prevent dehydration. Your caregiver may give you an inhaler to improve your symptoms. The inhaler reduces shortness of breath and helps control cough. You can take over-the-counter pain relievers or cough medicine to decrease coughing, pain, or fever. A cool-air vaporizer may help thin bronchial secretions and make it easier to clear your chest. Antibiotics are usually not needed but can be prescribed if you smoke, are seriously ill, have chronic lung problems, are elderly, or you are at higher risk for developing complications. Allergies and asthma can make bronchitis worse. Repeated episodes of bronchitis may cause longstanding lung problems. Avoid smoking and secondhand smoke. Exposure to cigarette smoke or irritating chemicals will make bronchitis worse. If you are a cigarette smoker, consider using nicotine gum or skin patches to help control withdrawal symptoms. Quitting smoking will help your lungs heal faster. Recovery from bronchitis is often slow, but you should start feeling better after 2 to 3 days. Cough from bronchitis frequently lasts for 3 to 4 weeks. To prevent another bout of acute  bronchitis:  Quit smoking.     Wash your hands frequently to get rid of viruses or use a hand sanitizer.     Avoid other people with cold or virus symptoms.     Try not to touch your hands to your mouth, nose, or eyes.  SEEK IMMEDIATE MEDICAL CARE IF:  You develop increased fever, chills, or chest pain.     You have severe shortness of breath or bloody sputum.     You develop dehydration, fainting, repeated vomiting, or a severe headache.     You have no improvement after 1 week of treatment or you get worse.  MAKE SURE YOU:    Understand these instructions.     Will watch your condition.     Will get help right away if you are not doing well or get worse.  Document Released: 05/16/2004 Document Revised: 12/19/2010 Document Reviewed: 08/01/2010 [facility] Patient Information 2012 [place], [place].   Smoking Cessation This document explains the best ways for you to quit smoking and new treatments to help. It lists new medicines that can double or triple your chances of quitting and quitting for good. It also considers ways to avoid relapses and concerns you may have about quitting, including weight gain. NICOTINE: A POWERFUL ADDICTION If you have tried to quit smoking, you know how hard it can be. It is hard because nicotine is a very addictive drug. For some people, it can be as addictive as heroin or cocaine. Usually, people make 2  or 3 tries, or more, before finally being able to quit. Each time you try to quit, you can learn about what helps and what hurts. Quitting takes hard work and a lot of effort, but you can quit smoking. QUITTING SMOKING IS ONE OF THE MOST IMPORTANT THINGS YOU WILL EVER DO.  You will live longer, feel better, and live better.     The impact on your body of quitting smoking is felt almost immediately:     Within 20 minutes, blood pressure decreases. Pulse returns to its normal level.     After 8 hours, carbon monoxide levels in the blood return to  normal. Oxygen level increases.     After 24 hours, chance of heart attack starts to decrease. Breath, hair, and body stop smelling like smoke.     After 48 hours, damaged nerve endings begin to recover. Sense of taste and smell improve.     After 72 hours, the body is virtually free of nicotine. Bronchial tubes relax and breathing becomes easier.     After 2 to 12 weeks, lungs can hold more air. Exercise becomes easier and circulation improves.     Quitting will reduce your risk of having a heart attack, stroke, cancer, or lung disease:     After 1 year, the risk of coronary heart disease is cut in half.     After 5 years, the risk of stroke falls to the same as a nonsmoker.     After 10 years, the risk of lung cancer is cut in half and the risk of other cancers decreases significantly.     After 15 years, the risk of coronary heart disease drops, usually to the level of a nonsmoker.     If you are pregnant, quitting smoking will improve your chances of having a healthy baby.     The people you live with, especially your children, will be healthier.     You will have extra money to spend on things other than cigarettes.  FIVE KEYS TO QUITTING Studies have shown that these 5 steps will help you quit smoking and quit for good. You have the best chances of quitting if you use them together: 1. Get ready.    2. Get support and encouragement.    3. Learn new skills and behaviors.    4. Get medicine to reduce your nicotine addiction and use it correctly.    5. Be prepared for relapse or difficult situations. Be determined to continue trying to quit, even if you do not succeed at first.  1. GET READY  Set a quit date.     Change your environment.     Get rid of ALL cigarettes, ashtrays, matches, and lighters in your home, car, and place of work.     Do not let people smoke in your home.     Review your past attempts to quit. Think about what worked and what did not.     Once you  quit, do not smoke. NOT EVEN A PUFF!  2. GET SUPPORT AND ENCOURAGEMENT Studies have shown that you have a better chance of being successful if you have help. You can get support in many ways.  Tell your family, friends, and coworkers that you are going to quit and need their support. Ask them not to smoke around you.     Talk to your caregivers (doctor, dentist, nurse, pharmacist, psychologist, and/or smoking counselor).     Get individual, group, or telephone  counseling and support. The more counseling you have, the better your chances are of quitting. Programs are available at [employer] and health centers. Call your local health department for information about programs in your area.     Spiritual beliefs and practices may help some smokers quit.     Quit meters are Photographer that keep track of quit statistics, such as amount of "quit-time," cigarettes not smoked, and money saved.     Many smokers find one or more of the many self-help books available useful in helping them quit and stay off tobacco.  3. LEARN NEW SKILLS AND BEHAVIORS  Try to distract yourself from urges to smoke. Talk to someone, go for a walk, or occupy your time with a task.     When you first try to quit, change your routine. Take a different route to work. Drink tea instead of coffee. Eat breakfast in a different place.     Do something to reduce your stress. Take a hot bath, exercise, or read a book.     Plan something enjoyable to do every day. Reward yourself for not smoking.     Explore interactive web-based programs that specialize in helping you quit.  4. GET MEDICINE AND USE IT CORRECTLY Medicines can help you stop smoking and decrease the urge to smoke. Combining medicine with the above behavioral methods and support can quadruple your chances of successfully quitting smoking. The U.S. Food and Drug Administration (FDA) has approved 7 medicines to help you quit  smoking. These medicines fall into 3 categories.  Nicotine replacement therapy (delivers nicotine to your body without the negative effects and risks of smoking):     Nicotine gum: Available over-the-counter.     Nicotine lozenges: Available over-the-counter.     Nicotine inhaler: Available by prescription.     Nicotine nasal spray: Available by prescription.     Nicotine skin patches (transdermal): Available by prescription and over-the-counter.     Antidepressant medicine (helps people abstain from smoking, but how this works is unknown):     Bupropion sustained-release (SR) tablets: Available by prescription.     Nicotinic receptor partial agonist (simulates the effect of nicotine in your brain):     Varenicline tartrate tablets: Available by prescription.     Ask your caregiver for advice about which medicines to use and how to use them. Carefully read the information on the package.     Everyone who is trying to quit may benefit from using a medicine. If you are pregnant or trying to become pregnant, nursing an infant, you are under age 15, or you smoke fewer than 10 cigarettes per day, talk to your caregiver before taking any nicotine replacement medicines.     You should stop using a nicotine replacement product and call your caregiver if you experience nausea, dizziness, weakness, vomiting, fast or irregular heartbeat, mouth problems with the lozenge or gum, or redness or swelling of the skin around the patch that does not go away.     Do not use any other product containing nicotine while using a nicotine replacement product.     Talk to your caregiver before using these products if you have diabetes, heart disease, asthma, stomach ulcers, you had a recent heart attack, you have high blood pressure that is not controlled with medicine, a history of irregular heartbeat, or you have been prescribed medicine to help you quit smoking.  5. BE PREPARED FOR RELAPSE OR DIFFICULT  SITUATIONS  Most relapses occur within the first 3 months after quitting. Do not be discouraged if you start smoking again. Remember, most people try several times before they finally quit.     You may have symptoms of withdrawal because your body is used to nicotine. You may crave cigarettes, be irritable, feel very hungry, cough often, get headaches, or have difficulty concentrating.     The withdrawal symptoms are only temporary. They are strongest when you first quit, but they will go away within 10 to 14 days.  Here are some difficult situations to watch for:  Alcohol. Avoid drinking alcohol. Drinking lowers your chances of successfully quitting.     Caffeine. Try to reduce the amount of caffeine you consume. It also lowers your chances of successfully quitting.     Other smokers. Being around smoking can make you want to smoke. Avoid smokers.     Weight gain. Many smokers will gain weight when they quit, usually less than 10 pounds. Eat a healthy diet and stay active. Do not let weight gain distract you from your main goal, quitting smoking. Some medicines that help you quit smoking may also help delay weight gain. You can always lose the weight gained after you quit.     Bad mood or depression. There are a lot of ways to improve your mood other than smoking.  If you are having problems with any of these situations, talk to your caregiver. SPECIAL SITUATIONS AND CONDITIONS Studies suggest that everyone can quit smoking. Your situation or condition can give you a special reason to quit.  Pregnant women/new mothers: By quitting, you protect your baby's health and your own.     Hospitalized patients: By quitting, you reduce health problems and help healing.     Heart attack patients: By quitting, you reduce your risk of a second heart attack.     Lung, head, and neck cancer patients: By quitting, you reduce your chance of a second cancer.     Parents of children and adolescents: By  quitting, you protect your children from illnesses caused by secondhand smoke.  QUESTIONS TO THINK ABOUT Think about the following questions before you try to stop smoking. You may want to talk about your answers with your caregiver.  Why do you want to quit?     If you tried to quit in the past, what helped and what did not?     What will be the most difficult situations for you after you quit? How will you plan to handle them?     Who can help you through the tough times? Your family? Friends? Caregiver?     What pleasures do you get from smoking? What ways can you still get pleasure if you quit?  Here are some questions to ask your caregiver:  How can you help me to be successful at quitting?     What medicine do you think would be best for me and how should I take it?     What should I do if I need more help?     What is smoking withdrawal like? How can I get information on withdrawal?  Quitting takes hard work and a lot of effort, but you can quit smoking. FOR MORE INFORMATION   Smokefree.gov (http://www.davis-sullivan.com/) provides free, accurate, evidence-based information and professional assistance to help support the immediate and long-term needs of people trying to quit smoking. Document Released: 04/02/2001 Document Revised: 12/19/2010 Document Reviewed: 01/23/2009 [employer] Patient Information 2012  [employer].

## 2011-04-04 NOTE — Progress Notes (Signed)
Per VO, Dr Debby Bud

## 2011-04-04 NOTE — Progress Notes (Signed)
  Subjective:    Patient ID: Sheila Morales, female    DOB: [birth year], 60 y.o.   MRN: [id number]  HPI Sheila Morales presents with a 2 week h/o cough with a productive sputum of clear to brown mucus, no fever, no increased SOB, positive for hoarseness. Cough is keeping her up at night. Continues to smoke.   Past Medical History  Diagnosis Date  . Systemic lupus erythematosus   . H/O: rheumatic fever   . Colitis, ischemic   . Restless leg syndrome     questionable  . History of adenomatous polyp of colon   . IBS (irritable bowel syndrome)    Past Surgical History  Procedure Date  . Lesion, vulva excision   . Tonsillectomy   . Cholecystectomy, laparoscopic    Family History  Problem Relation Age of Onset  . Atrial fibrillation Father    History   Social History  . Marital Status: Married    Spouse Name: N/A    Number of Children: 2  . Years of Education: 12   Occupational History  . cleaning business    Social History Main Topics  . Smoking status: Current Everyday Smoker -- 1.0 packs/day for 30 years    Types: Cigarettes  . Smokeless tobacco: Never Used  . Alcohol Use: 2.5 oz/week    5 drink(s) per week  . Drug Use: No  . Sexually Active: Yes   Other Topics Concern  . Not on file   Social History Narrative   HSG. Married '71. 1 daughter- '74, '78; 2 grand daughters.Work: International aid/development worker. Lives with husband and mother-in-law is moving in after rehab.        Review of Systems System review is negative for any constitutional, cardiac, pulmonary, GI or neuro symptoms or complaints other than as described in the HPI.     Objective:   Physical Exam Vitals reviewed - stable, no fever Gen'l - WNWD white woman in no distress HEENT- no sinus tendrness to percussion, throat clear Pulm - no increased WOB, coarse wet cough, rhonchi noted, no wheeze Cor - RRR Neuro - A&O x 3       Assessment & Plan:

## 2011-04-05 ENCOUNTER — Telehealth: Payer: Self-pay | Admitting: *Deleted

## 2011-04-05 NOTE — Telephone Encounter (Signed)
Pt states Rx[s] did not reach pharmacy 12.13.12; phoned in to pharmacy [facility] medicine & abx]. LMOM to inform patient.

## 2011-04-06 NOTE — Assessment & Plan Note (Signed)
Adamantly encouraged smoking cessation

## 2011-04-06 NOTE — Assessment & Plan Note (Signed)
Recurrent acute bronchitis - smoker.  Plan - septra ds bid x 7           Prom/cod 1 tsp q6

## 2011-04-24 ENCOUNTER — Telehealth: Payer: Self-pay | Admitting: *Deleted

## 2011-04-24 NOTE — Telephone Encounter (Signed)
Pt reports No fever, just "a little cough & scratchy throat" but would like to have because "the weather has been so cold".

## 2011-04-24 NOTE — Telephone Encounter (Signed)
Pt requesting new Rx for both Prometh-Codeine [has already used the [1] refill on the original Rx] & Bactrim given on 12.13.12 OV. Patient wants to change pharmacy: Use [facility]

## 2011-04-24 NOTE — Telephone Encounter (Signed)
Called - no answer, left a msg - need more information: fever?, productive cough? SOB? With more info can make a disposition

## 2011-04-25 MED ORDER — PROMETHAZINE-CODEINE 6.25-10 MG/5ML PO SYRP: 5.0000 mL | ORAL_SOLUTION | ORAL | Status: AC | PRN

## 2011-04-25 NOTE — Telephone Encounter (Signed)
Ok for refill on cough syrup x 1, but no indication for antibiotics

## 2011-04-25 NOTE — Telephone Encounter (Signed)
Rx phoned in. Informed patient.

## 2011-05-06 ENCOUNTER — Telehealth: Payer: Self-pay

## 2011-05-06 MED ORDER — SULFAMETHOXAZOLE-TRIMETHOPRIM 800-160 MG PO TABS: 1.0000 | ORAL_TABLET | Freq: Two times a day (BID) | ORAL | Status: AC

## 2011-05-06 NOTE — Telephone Encounter (Signed)
Pt called stating she having "same throat problem". Pt is hoarse and has productive cough. Pt is requesting refill of ABX given at last OV.

## 2011-05-06 NOTE — Telephone Encounter (Signed)
Called pt- recurrent symptoms similar to Dec 17th  Plan - septra DS bid x 10 to [facility]

## 2011-10-22 ENCOUNTER — Telehealth: Payer: Self-pay

## 2011-10-22 NOTE — Telephone Encounter (Signed)
Please advis if ok to refill zolpidem 10 mg for this pt. Medication last filled 04/04/11 #30/5rf

## 2011-10-23 NOTE — Telephone Encounter (Signed)
Ok for refill x 5 

## 2011-10-23 NOTE — Telephone Encounter (Signed)
Pharmacy notified.

## 2011-11-02 ENCOUNTER — Telehealth: Payer: Self-pay

## 2011-11-02 NOTE — Telephone Encounter (Signed)
Please advise if ok to refill zolpidem 10mg . Medication last filled 04/04/11 Th[facility]

## 2011-11-03 NOTE — Telephone Encounter (Signed)
Ok x 5 

## 2011-11-04 MED ORDER — ZOLPIDEM TARTRATE 10 MG PO TABS: 10.0000 mg | ORAL_TABLET | Freq: Every evening | ORAL | Status: DC | PRN

## 2011-11-04 NOTE — Addendum Note (Signed)
Addended by: Rock Nephew T on: 11/04/2011 01:58 PM   Modules accepted: Orders

## 2011-11-04 NOTE — Telephone Encounter (Signed)
Rx called in per MD 

## 2012-02-13 ENCOUNTER — Ambulatory Visit (INDEPENDENT_AMBULATORY_CARE_PROVIDER_SITE_OTHER): Payer: PRIVATE HEALTH INSURANCE

## 2012-02-13 DIAGNOSIS — Z23 Encounter for immunization: Secondary | ICD-10-CM

## 2012-06-01 ENCOUNTER — Other Ambulatory Visit: Payer: Self-pay | Admitting: Internal Medicine

## 2012-06-02 ENCOUNTER — Other Ambulatory Visit: Payer: Self-pay | Admitting: Internal Medicine

## 2012-07-02 ENCOUNTER — Encounter: Payer: Self-pay | Admitting: Internal Medicine

## 2012-07-03 ENCOUNTER — Encounter: Payer: Self-pay | Admitting: Internal Medicine

## 2012-10-29 ENCOUNTER — Other Ambulatory Visit: Payer: Self-pay | Admitting: Rheumatology

## 2012-10-29 DIAGNOSIS — I83819 Varicose veins of unspecified lower extremities with pain: Secondary | ICD-10-CM

## 2012-11-03 ENCOUNTER — Ambulatory Visit
Admission: RE | Admit: 2012-11-03 | Discharge: 2012-11-03 | Disposition: A | Discharge status: Home / Self Care | Payer: PRIVATE HEALTH INSURANCE | Source: Ambulatory Visit | Attending: Rheumatology | Admitting: Rheumatology

## 2012-11-03 DIAGNOSIS — I83819 Varicose veins of unspecified lower extremities with pain: Secondary | ICD-10-CM

## 2012-11-03 HISTORY — DX: Unspecified osteoarthritis, unspecified site: M19.90

## 2012-11-03 HISTORY — DX: Varicose veins of bilateral lower extremities with pain: I83.813

## 2012-11-13 ENCOUNTER — Telehealth: Payer: Self-pay | Admitting: Emergency Medicine

## 2012-11-13 NOTE — Telephone Encounter (Signed)
CALLED PT TO MAKE HER AWARE THAT [employer] HAS DENIED HER CLAIM FOR EVLT OF BLE UNTIL SHE HAS  OF CONSERVATIVE TX.  WE WILL HAVE HER RETURN FOR A VISIT AROUND 02-03-13, RE-EVAL AND RE-SUBMIT.

## 2012-12-10 ENCOUNTER — Other Ambulatory Visit: Payer: Self-pay | Admitting: Internal Medicine

## 2012-12-14 ENCOUNTER — Other Ambulatory Visit: Payer: Self-pay | Admitting: Internal Medicine

## 2012-12-14 NOTE — Telephone Encounter (Signed)
Zolpidem called to pharmacy  

## 2013-01-05 ENCOUNTER — Other Ambulatory Visit (HOSPITAL_COMMUNITY): Payer: Self-pay | Admitting: Interventional Radiology

## 2013-01-05 DIAGNOSIS — I83813 Varicose veins of bilateral lower extremities with pain: Secondary | ICD-10-CM

## 2013-01-11 ENCOUNTER — Other Ambulatory Visit: Payer: Self-pay | Admitting: Dermatology

## 2013-01-21 ENCOUNTER — Other Ambulatory Visit: Payer: Self-pay | Admitting: Internal Medicine

## 2013-02-10 ENCOUNTER — Ambulatory Visit
Admission: RE | Admit: 2013-02-10 | Discharge: 2013-02-10 | Disposition: A | Discharge status: Home / Self Care | Payer: PRIVATE HEALTH INSURANCE | Source: Ambulatory Visit | Attending: Interventional Radiology | Admitting: Interventional Radiology

## 2013-02-10 DIAGNOSIS — I83813 Varicose veins of bilateral lower extremities with pain: Secondary | ICD-10-CM

## 2013-02-10 NOTE — Progress Notes (Signed)
Has been wearing thigh high graduated compression, 20-30 mm Hg daily with only minimal relief of "sharp pain".  Continues to experience cramping sensation in feet & calves and aching in feet and ankles.  Pain less in early AM but increases during daily activities.  Taking Aleve as needed.  Elevation provides minimal relief.     Lemont Sitzmann Carmell Austria, RN 02/10/2013 2:54 PM

## 2013-02-16 ENCOUNTER — Other Ambulatory Visit: Payer: Self-pay | Admitting: Internal Medicine

## 2013-02-17 ENCOUNTER — Other Ambulatory Visit: Payer: Self-pay | Admitting: Internal Medicine

## 2013-02-18 ENCOUNTER — Encounter: Payer: Self-pay | Admitting: Internal Medicine

## 2013-02-25 ENCOUNTER — Other Ambulatory Visit (HOSPITAL_COMMUNITY): Payer: Self-pay | Admitting: Interventional Radiology

## 2013-02-25 DIAGNOSIS — I83813 Varicose veins of bilateral lower extremities with pain: Secondary | ICD-10-CM

## 2013-02-26 ENCOUNTER — Other Ambulatory Visit (HOSPITAL_COMMUNITY): Payer: Self-pay | Admitting: Interventional Radiology

## 2013-02-26 DIAGNOSIS — I83813 Varicose veins of bilateral lower extremities with pain: Secondary | ICD-10-CM

## 2013-03-11 ENCOUNTER — Ambulatory Visit
Admission: RE | Admit: 2013-03-11 | Discharge: 2013-03-11 | Disposition: A | Discharge status: Home / Self Care | Payer: PRIVATE HEALTH INSURANCE | Source: Ambulatory Visit | Attending: Interventional Radiology | Admitting: Interventional Radiology

## 2013-03-11 VITALS — BP 158/76 | HR 92 | Temp 98.1°F | Resp 15 | Ht 63.0 in | Wt 130.0 lb

## 2013-03-11 DIAGNOSIS — I83813 Varicose veins of bilateral lower extremities with pain: Secondary | ICD-10-CM

## 2013-03-11 MED ORDER — DIAZEPAM 10 MG PO TABS: 10.0000 mg | ORAL_TABLET | Freq: Once | ORAL | Status: DC

## 2013-03-11 NOTE — Progress Notes (Signed)
0945  Informed consent obtained.  Patient given verbal & written discharge instructions & states that she understands.  4782  Valium 10 mg po.  0955  22 gauge x 1" insyte catheter started IV Right antecubital on 1st attempt with Saline lock & 7" extension.  Flushed with 10 ml NSS without difficulty.    1050  Procedure started.  1140  Procedure completed.  1150  Wearing thigh high graduated compression garment, 20-30 mm Hg, on RLE.  1155  IV dc'd, catheter intact.  Site unremarkable.  1200 Review discharge instructions with patient.  Ambulated x 10 minutes.  Gait steady.  Tolerated well.  1210  Patient's husband here to provide transportation.  Discharged to home.  Elveria Lauderbaugh Carmell Austria, RN 03/11/2013 12:06 PM

## 2013-03-17 ENCOUNTER — Ambulatory Visit
Admission: RE | Admit: 2013-03-17 | Discharge: 2013-03-17 | Disposition: A | Discharge status: Home / Self Care | Payer: PRIVATE HEALTH INSURANCE | Source: Ambulatory Visit | Attending: Interventional Radiology | Admitting: Interventional Radiology

## 2013-03-17 ENCOUNTER — Other Ambulatory Visit: Payer: PRIVATE HEALTH INSURANCE

## 2013-03-17 DIAGNOSIS — I83813 Varicose veins of bilateral lower extremities with pain: Secondary | ICD-10-CM

## 2013-03-23 ENCOUNTER — Ambulatory Visit: Payer: PRIVATE HEALTH INSURANCE

## 2013-03-23 ENCOUNTER — Other Ambulatory Visit: Payer: PRIVATE HEALTH INSURANCE

## 2013-03-27 ENCOUNTER — Other Ambulatory Visit: Payer: Self-pay | Admitting: Internal Medicine

## 2013-03-29 ENCOUNTER — Other Ambulatory Visit: Payer: Self-pay | Admitting: Internal Medicine

## 2013-03-31 ENCOUNTER — Other Ambulatory Visit: Payer: PRIVATE HEALTH INSURANCE

## 2013-03-31 ENCOUNTER — Inpatient Hospital Stay: Admission: RE | Admit: 2013-03-31 | Payer: PRIVATE HEALTH INSURANCE | Source: Ambulatory Visit

## 2013-04-08 ENCOUNTER — Other Ambulatory Visit: Payer: PRIVATE HEALTH INSURANCE

## 2013-04-08 ENCOUNTER — Ambulatory Visit: Payer: PRIVATE HEALTH INSURANCE

## 2013-04-21 ENCOUNTER — Ambulatory Visit
Admission: RE | Admit: 2013-04-21 | Discharge: 2013-04-21 | Disposition: A | Discharge status: Home / Self Care | Payer: PRIVATE HEALTH INSURANCE | Source: Ambulatory Visit | Attending: Interventional Radiology | Admitting: Interventional Radiology

## 2013-04-21 ENCOUNTER — Other Ambulatory Visit (HOSPITAL_COMMUNITY): Payer: Self-pay | Admitting: Interventional Radiology

## 2013-04-21 VITALS — BP 159/102 | HR 95 | Temp 98.0°F | Resp 14

## 2013-04-21 DIAGNOSIS — I83813 Varicose veins of bilateral lower extremities with pain: Secondary | ICD-10-CM

## 2013-04-21 MED ORDER — DIAZEPAM 5 MG PO TABS: 10.0000 mg | ORAL_TABLET | Freq: Once | ORAL | Status: DC

## 2013-04-21 NOTE — Progress Notes (Signed)
1610  Informed consent obtained.  Given verbal & written discharge instructions & states that she understands.  0815  Valium 10 mg po.  0845  22 gauge x 1" insyte catheter started IV Right antecubital (1st attempt) with Saline lock & 7" extension.  Flushed with 10 ml NSS w/o difficulty.  Site unremarkable.  1040  Wearing thigh high graduated compression garment, 20-30 mm Hg on both legs.  9604  Procedure started.  1025  Procedure completed.  1045  IV discontinued, catheter intact.  Site unremarkable.    1050  Ambulated x 10 minutes, gait steady.  Tolerated well.  1100 Patient's husband here to provide transportation to home.  Discharged to home.  Boleslaw Borghi Carmell Austria, RN 04/21/2013 11:09 AM

## 2013-04-28 ENCOUNTER — Ambulatory Visit
Admission: RE | Admit: 2013-04-28 | Discharge: 2013-04-28 | Disposition: A | Discharge status: Home / Self Care | Payer: No Typology Code available for payment source | Source: Ambulatory Visit | Attending: Interventional Radiology | Admitting: Interventional Radiology

## 2013-04-28 ENCOUNTER — Ambulatory Visit
Admission: RE | Admit: 2013-04-28 | Discharge: 2013-04-28 | Disposition: A | Discharge status: Home / Self Care | Payer: PRIVATE HEALTH INSURANCE | Source: Ambulatory Visit | Attending: Interventional Radiology | Admitting: Interventional Radiology

## 2013-04-28 DIAGNOSIS — I83813 Varicose veins of bilateral lower extremities with pain: Secondary | ICD-10-CM

## 2013-05-05 ENCOUNTER — Other Ambulatory Visit: Payer: Self-pay | Admitting: Internal Medicine

## 2013-05-06 ENCOUNTER — Other Ambulatory Visit: Payer: Self-pay | Admitting: Internal Medicine

## 2013-05-25 ENCOUNTER — Ambulatory Visit
Admission: RE | Admit: 2013-05-25 | Discharge: 2013-05-25 | Disposition: A | Discharge status: Home / Self Care | Payer: PRIVATE HEALTH INSURANCE | Source: Ambulatory Visit | Attending: Interventional Radiology | Admitting: Interventional Radiology

## 2013-05-25 DIAGNOSIS — I83813 Varicose veins of bilateral lower extremities with pain: Secondary | ICD-10-CM

## 2013-06-21 ENCOUNTER — Other Ambulatory Visit: Payer: Self-pay | Admitting: Internal Medicine

## 2013-08-10 ENCOUNTER — Telehealth: Payer: Self-pay | Admitting: *Deleted

## 2013-08-10 NOTE — Telephone Encounter (Signed)
Rf req for Ambien 10 mg 1 po qhs prn # 30. Last filled 06/21/13. Ok to Rf?

## 2013-08-11 NOTE — Telephone Encounter (Signed)
Rx called in as directed.   

## 2013-08-11 NOTE — Telephone Encounter (Signed)
OK to fill this prescription with additional refills x3 Thank you!  

## 2013-09-21 ENCOUNTER — Telehealth: Payer: Self-pay | Admitting: Radiology

## 2013-09-21 NOTE — Telephone Encounter (Signed)
Patient called in reference to 6 mo follow up bilateral EVLT.  States that she is doing well.  Symptoms have greatly improved.  Does not want to schedule follow up at present as she does not currently have insurance benefits.  States that she will follow up as needed.  Kylen Ismael Riki Rusk, [place] 09/21/2013 7:48 AM

## 2013-09-27 ENCOUNTER — Other Ambulatory Visit (HOSPITAL_COMMUNITY): Payer: Self-pay | Admitting: Interventional Radiology

## 2013-09-27 DIAGNOSIS — I8311 Varicose veins of right lower extremity with inflammation: Secondary | ICD-10-CM

## 2013-09-27 DIAGNOSIS — I8312 Varicose veins of left lower extremity with inflammation: Principal | ICD-10-CM

## 2013-10-05 ENCOUNTER — Ambulatory Visit
Admission: RE | Admit: 2013-10-05 | Discharge: 2013-10-05 | Disposition: A | Discharge status: Home / Self Care | Payer: No Typology Code available for payment source | Source: Ambulatory Visit | Attending: Interventional Radiology | Admitting: Interventional Radiology

## 2013-10-05 DIAGNOSIS — I8312 Varicose veins of left lower extremity with inflammation: Principal | ICD-10-CM

## 2013-10-05 DIAGNOSIS — I8311 Varicose veins of right lower extremity with inflammation: Secondary | ICD-10-CM

## 2013-10-12 ENCOUNTER — Ambulatory Visit (INDEPENDENT_AMBULATORY_CARE_PROVIDER_SITE_OTHER)
Admission: RE | Admit: 2013-10-12 | Discharge: 2013-10-12 | Disposition: A | Discharge status: Home / Self Care | Payer: Self-pay | Source: Ambulatory Visit | Attending: Internal Medicine | Admitting: Internal Medicine

## 2013-10-12 ENCOUNTER — Ambulatory Visit (INDEPENDENT_AMBULATORY_CARE_PROVIDER_SITE_OTHER): Payer: Self-pay | Admitting: Internal Medicine

## 2013-10-12 ENCOUNTER — Encounter: Payer: Self-pay | Admitting: Internal Medicine

## 2013-10-12 VITALS — BP 128/90 | HR 85 | Temp 98.4°F | Wt 138.6 lb

## 2013-10-12 DIAGNOSIS — J209 Acute bronchitis, unspecified: Secondary | ICD-10-CM

## 2013-10-12 DIAGNOSIS — J208 Acute bronchitis due to other specified organisms: Secondary | ICD-10-CM

## 2013-10-12 DIAGNOSIS — H109 Unspecified conjunctivitis: Secondary | ICD-10-CM

## 2013-10-12 DIAGNOSIS — R0689 Other abnormalities of breathing: Secondary | ICD-10-CM

## 2013-10-12 DIAGNOSIS — R0989 Other specified symptoms and signs involving the circulatory and respiratory systems: Secondary | ICD-10-CM

## 2013-10-12 DIAGNOSIS — M329 Systemic lupus erythematosus, unspecified: Secondary | ICD-10-CM

## 2013-10-12 MED ORDER — ERYTHROMYCIN 5 MG/GM OP OINT: 1.0000 "application " | TOPICAL_OINTMENT | Freq: Four times a day (QID) | OPHTHALMIC | Status: DC

## 2013-10-12 MED ORDER — AZITHROMYCIN 250 MG PO TABS: ORAL_TABLET | ORAL | Status: DC

## 2013-10-12 NOTE — Progress Notes (Signed)
Pre visit review using our clinic review tool, if applicable. No additional management support is needed unless otherwise documented below in the visit note. 

## 2013-10-12 NOTE — Patient Instructions (Signed)
Your next office appointment will be determined based upon review of your pending labs & x-rays. Those instructions will be transmitted to you by mail. Share results with Dr Lenna Gilford. Followup as needed for your acute issue. Please report any significant change in your symptoms.

## 2013-10-12 NOTE — Progress Notes (Signed)
   Subjective:    Patient ID: Sheila Morales, female    DOB: 17-Dec-1950, 63 y.o.   MRN: [id number]  HPI  Her symptoms began 10/09/13 as a sore throat associated with nonproductive cough. As of 6/22 the cough has become productive of clear sputum.  She's also had fever, chills and sweats. Her temp has been up to 99.8.  She's had some itchy, watery eyes as well as sneezing  As of 6/21 she developed some matting in the right eye which subsequently also involved the left. She has noted some discharge. Nonsteroidals have been of some benefit.  She has lupus with dermatologic involvement and is followed by Dr.Hawkes. She's on Plaquenil.  She continues to smoke a pack a day  Note: Dr Trudie Reed' notes reviewed ; dx is SYSTEMIC lupus.     Review of Systems At this time she has no frontal headache, facial pain, dental pain, sore throat, or nasal purulence . She has no earache or otic or discharge  There is no shortness of breath or wheezing associated with the cough.       Objective:   Physical Exam  Significant or distinguishing  findings on physical exam are documented first.  Below that are other systems examined & findings.  Slightly weathered facies. Mild erythema of OS conjunctivae without purulence. The right conjunctiva appears normal. She is hoarse. She has dry rales at both bases without increased work of breathing. Slight clubbing noted of note the nail beds.  General appearance:adequately nourished; no acute distress or increased work of breathing is present.  No  lymphadenopathy about the head, neck, or axilla noted.   Eyes: No  lid edema is present. There is no scleral icterus.  Ears:  External ear exam shows no significant lesions or deformities.  Otoscopic examination reveals clear canals, tympanic membranes are intact bilaterally without bulging, retraction, inflammation or discharge.  Nose:  External nasal examination shows no deformity or inflammation. Nasal mucosa  are dry without lesions or exudates. No septal dislocation or deviation.No obstruction to airflow.   Oral exam: Dental hygiene is good; lips and gums are healthy appearing.There is no oropharyngeal erythema or exudate noted.   Neck:  No deformities, thyromegaly, masses, or tenderness noted.   Supple with full range of motion without pain.   Heart:  Normal rate and regular rhythm. S1 and S2 normal without gallop, murmur, click, rub or other extra sounds.   Lungs:No increased work of breathing.    Extremities:  No cyanosis, edema  noted    Skin: Warm & dry w/o jaundice or tenting.         Assessment & Plan:  #1 bronchitis with abnormal breath sounds; rule out lupus lung involvement  #2 mild left conjunctivitis  #3 smoker; risks discussed.  See orders and recommendations

## 2013-10-13 DIAGNOSIS — M329 Systemic lupus erythematosus, unspecified: Secondary | ICD-10-CM | POA: Insufficient documentation

## 2013-10-15 ENCOUNTER — Other Ambulatory Visit: Payer: Self-pay | Admitting: Internal Medicine

## 2013-10-18 ENCOUNTER — Telehealth: Payer: Self-pay

## 2013-10-18 NOTE — Telephone Encounter (Signed)
Message copied by Shelly Coss on Mon Oct 18, 2013  8:27 AM ------      Message from: Hendricks Limes      Created: Sun Oct 17, 2013 12:02 PM       Please FAX office visit , Xray & lab results to Dr Trudie Reed ------

## 2013-10-18 NOTE — Telephone Encounter (Signed)
Information has been faxed to Dr Trudie Reed [phone]

## 2013-10-18 NOTE — Telephone Encounter (Signed)
Would need F/U if no better rather than refill

## 2013-11-09 ENCOUNTER — Ambulatory Visit (INDEPENDENT_AMBULATORY_CARE_PROVIDER_SITE_OTHER): Payer: Self-pay | Admitting: Internal Medicine

## 2013-11-09 ENCOUNTER — Encounter: Payer: Self-pay | Admitting: Internal Medicine

## 2013-11-09 VITALS — BP 122/78 | HR 99 | Temp 98.3°F | Wt 140.0 lb

## 2013-11-09 DIAGNOSIS — M329 Systemic lupus erythematosus, unspecified: Secondary | ICD-10-CM

## 2013-11-09 DIAGNOSIS — R05 Cough: Secondary | ICD-10-CM

## 2013-11-09 DIAGNOSIS — R059 Cough, unspecified: Secondary | ICD-10-CM

## 2013-11-09 DIAGNOSIS — R9389 Abnormal findings on diagnostic imaging of other specified body structures: Secondary | ICD-10-CM

## 2013-11-09 DIAGNOSIS — F17200 Nicotine dependence, unspecified, uncomplicated: Secondary | ICD-10-CM

## 2013-11-09 MED ORDER — BENZONATATE 200 MG PO CAPS: 200.0000 mg | ORAL_CAPSULE | Freq: Three times a day (TID) | ORAL | Status: DC | PRN

## 2013-11-09 MED ORDER — AMOXICILLIN 500 MG PO CAPS: 500.0000 mg | ORAL_CAPSULE | Freq: Three times a day (TID) | ORAL | Status: DC

## 2013-11-09 NOTE — Progress Notes (Signed)
Pre visit review using our clinic review tool, if applicable. No additional management support is needed unless otherwise documented below in the visit note. 

## 2013-11-09 NOTE — Patient Instructions (Signed)
The Pulmonary referral will be scheduled and you'll be notified of the time.

## 2013-11-09 NOTE — Progress Notes (Signed)
   Subjective:    Patient ID: Sheila Morales, female    DOB: Mar 25, 1951, 63 y.o.   MRN: [id number]  HPI  She took a Z-Pak with partial response but over the last 2 days has developed cough which is essentially nonproductive. It has been progressive. She's not been exposed to any ill individuals. The cough has been associated with chest discomfort.  She questions whether a broader spectrum antibiotic such as amoxicillin would have greater intact. She does continue to smoke one pack per day.  Water and cold medicines do seem to help the cough. She also took some cough syrup with partial response to  She describes watery eyes. She also has sweats.    Her chest x-ray 10/12/13 revealed no pneumonia or acute pulmonary cardiopulmonary process. Chronic pulmonary interstitial changes were suggested. These films were reviewed with her  She is followed by Dr. Trudie Reed for lupus.    Review of Systems  She denies frontal headache, facial pain, nasal periods, or significant purulent sputum production  She is not having associated fever or chills.      Objective:   Physical Exam General appearance:good health ;well nourished; no acute distress or increased work of breathing is present.  No  lymphadenopathy about the head, neck, or axilla noted.   Eyes: No conjunctival inflammation or lid edema is present. There is no scleral icterus.  Ears:  External ear exam shows no significant lesions or deformities.  Otoscopic examination reveals clear canals, tympanic membranes are intact bilaterally without bulging, retraction, inflammation or discharge.  Nose:  External nasal examination shows no deformity or inflammation. Nasal mucosa are pink and moist without lesions or exudates. No septal dislocation or deviation.No obstruction to airflow.   Oral exam: Dental hygiene is good; lips and gums are healthy appearing.There is no oropharyngeal erythema or exudate noted.   Neck:  No deformities, thyromegaly,  masses, or tenderness noted.   Supple with full range of motion without pain.   Heart:  Normal rate and regular rhythm. S1 and S2 normal without gallop, murmur, click, rub or other extra sounds.   Lungs:No increased work of breathing. Breath sounds are generally decreased. Very faint rales are suggested.    Extremities:  No cyanosis or edema. Slight clubbing  noted    Skin: Warm & dry w/o jaundice or tenting. Wrinkling of face            Assessment & Plan:  #1 cough;  most likely is due to reactive airways in  the context of one pack per day smoking  #2 interstitial changes on x-ray in the context of systemic lupus erythematosus.  A course of amoxicillin will be prescribed; but I would doubt resolution of cough w/o smoking cessation.  It is imperative to rule out collagen vascular disease involvement of the lung with interstitial changes noted.  Lupus lung involvement with most

## 2013-11-11 ENCOUNTER — Telehealth: Payer: Self-pay

## 2013-11-11 NOTE — Telephone Encounter (Signed)
Message copied by Shelly Coss on Thu Nov 11, 2013  8:07 AM ------      Message from: Hendricks Limes      Created: Thu Nov 11, 2013  6:35 AM       Please FAX office visit & Xray results to Dr Trudie Reed, [facility]. Thanks, Hopp ------

## 2013-11-11 NOTE — Telephone Encounter (Signed)
Last office visit and xray faxed to Dr Trudie Reed, [facility] at [phone]

## 2014-01-21 ENCOUNTER — Ambulatory Visit (INDEPENDENT_AMBULATORY_CARE_PROVIDER_SITE_OTHER): Payer: Self-pay | Admitting: Internal Medicine

## 2014-01-21 ENCOUNTER — Encounter: Payer: Self-pay | Admitting: Internal Medicine

## 2014-01-21 VITALS — BP 132/82 | HR 98 | Temp 98.1°F | Resp 16 | Ht 63.0 in | Wt 135.8 lb

## 2014-01-21 DIAGNOSIS — M329 Systemic lupus erythematosus, unspecified: Secondary | ICD-10-CM

## 2014-01-21 DIAGNOSIS — Z720 Tobacco use: Secondary | ICD-10-CM

## 2014-01-21 DIAGNOSIS — F17200 Nicotine dependence, unspecified, uncomplicated: Secondary | ICD-10-CM

## 2014-01-21 DIAGNOSIS — K589 Irritable bowel syndrome without diarrhea: Secondary | ICD-10-CM

## 2014-01-21 DIAGNOSIS — Z23 Encounter for immunization: Secondary | ICD-10-CM

## 2014-01-21 NOTE — Assessment & Plan Note (Signed)
She does take plaquenil BID and this seems to keep her controlled. She sees Dr. Trudie Reed and will see her back next month.

## 2014-01-21 NOTE — Assessment & Plan Note (Signed)
She does still have some problems with this but no weight loss. She is able to use OTC meds for her troubles as needed. She will go back to see Dr. Carlean Purl next year sometime.

## 2014-01-21 NOTE — Progress Notes (Signed)
   Subjective:    Patient ID: Sheila Morales, female    DOB: 24-Feb-1951, 63 y.o.   MRN: [id number]  HPI The patient is a 62 YO female who is coming in today to establish care. She has PMH of lupus, GERD, IBS. She is a smoker of about 1 PPD and she does not wish to quit at this time. She does exercise daily and denies chest pain, SOB, abdominal pain. She does have some problems with her stomach but she does well if she watches her diet. She does not have insurance at this time and would like to wait on some things until next year when she gets medicare.   Review of Systems  Constitutional: Negative for fever, activity change, appetite change and fatigue.  HENT: Negative.   Eyes: Negative.   Respiratory: Negative for cough, chest tightness, shortness of breath and wheezing.   Cardiovascular: Negative for chest pain, palpitations and leg swelling.  Gastrointestinal: Positive for diarrhea. Negative for abdominal pain, constipation and abdominal distention.  Genitourinary: Negative.   Musculoskeletal: Negative.   Skin: Negative.   Neurological: Negative.       Objective:   Physical Exam  Constitutional: She is oriented to person, place, and time. She appears well-developed and well-nourished. No distress.  HENT:  Head: Normocephalic and atraumatic.  Eyes: EOM are normal.  Neck: Normal range of motion.  Cardiovascular: Normal rate and regular rhythm.   Pulmonary/Chest: Effort normal and breath sounds normal. No respiratory distress. She has no wheezes. She has no rales.  Abdominal: Soft. Bowel sounds are normal. She exhibits no distension. There is no tenderness. There is no rebound.  Neurological: She is alert and oriented to person, place, and time. No cranial nerve deficit.  Skin: Skin is warm and dry.   Filed Vitals:   01/21/14 1454  BP: 132/82  Pulse: 98  Temp: 98.1 F (36.7 C)  TempSrc: Oral  Resp: 16  Height: 5\' 3"  (1.6 m)  Weight: 135 lb 12.8 oz (61.598 kg)  SpO2: 97%     Assessment & Plan:

## 2014-01-21 NOTE — Assessment & Plan Note (Signed)
She does not wish to quit at this time.

## 2014-01-21 NOTE — Progress Notes (Signed)
Pre visit review using our clinic review tool, if applicable. No additional management support is needed unless otherwise documented below in the visit note. 

## 2014-01-21 NOTE — Patient Instructions (Signed)
We will not change any of your medicines today and will see you back in about 1 year.   Exercise to Stay Healthy Exercise helps you become and stay healthy. EXERCISE IDEAS AND TIPS Choose exercises that:  You enjoy.  Fit into your day. You do not need to exercise really hard to be healthy. You can do exercises at a slow or medium level and stay healthy. You can:  Stretch before and after working out.  Try yoga, Pilates, or tai chi.  Lift weights.  Walk fast, swim, jog, run, climb stairs, bicycle, dance, or rollerskate.  Take aerobic classes. Exercises that burn about 150 calories:  Running 1  miles in 15 minutes.  Playing volleyball for 45 to 60 minutes.  Washing and waxing a car for 45 to 60 minutes.  Playing touch football for 45 minutes.  Walking 1  miles in 35 minutes.  Pushing a stroller 1  miles in 30 minutes.  Playing basketball for 30 minutes.  Raking leaves for 30 minutes.  Bicycling 5 miles in 30 minutes.  Walking 2 miles in 30 minutes.  Dancing for 30 minutes.  Shoveling snow for 15 minutes.  Swimming laps for 20 minutes.  Walking up stairs for 15 minutes.  Bicycling 4 miles in 15 minutes.  Gardening for 30 to 45 minutes.  Jumping rope for 15 minutes.  Washing windows or floors for 45 to 60 minutes. Document Released: 05/11/2010 Document Revised: 07/01/2011 Document Reviewed: 05/11/2010 [facility] Patient Information 2015 [place], [place]. This information is not intended to replace advice given to you by your health care provider. Make sure you discuss any questions you have with your health care provider.

## 2014-01-24 ENCOUNTER — Other Ambulatory Visit: Payer: Self-pay

## 2014-01-24 MED ORDER — ZOLPIDEM TARTRATE 10 MG PO TABS: ORAL_TABLET | ORAL | Status: DC

## 2014-02-14 ENCOUNTER — Telehealth: Payer: Self-pay | Admitting: Internal Medicine

## 2014-02-14 NOTE — Telephone Encounter (Signed)
No, cannot increase due to the dose limitations and we should switch her to the 5 mg tabs with next refill. Can refill early November.

## 2014-02-14 NOTE — Telephone Encounter (Signed)
I called and left a message informing patient that her refill is due in early November, so we are not able to refill at this time

## 2014-02-14 NOTE — Telephone Encounter (Signed)
Dr. Doug Sou, would you like to increase the patients dosage to a whole tablet nightly? Please advise, thanks.

## 2014-02-14 NOTE — Telephone Encounter (Signed)
Pt called in said she is almost out of zolpidem (AMBIEN) 10 MG tablet [[id number]] because some nights she has to take a whole one.  She wanted to see if Dr Doug Sou could increase it as well.

## 2014-02-22 ENCOUNTER — Encounter: Payer: Self-pay | Admitting: Family

## 2014-02-22 ENCOUNTER — Ambulatory Visit (INDEPENDENT_AMBULATORY_CARE_PROVIDER_SITE_OTHER): Payer: Self-pay | Admitting: Family

## 2014-02-22 VITALS — BP 162/96 | HR 102 | Temp 98.2°F | Resp 18 | Ht 63.0 in | Wt 134.4 lb

## 2014-02-22 DIAGNOSIS — B9689 Other specified bacterial agents as the cause of diseases classified elsewhere: Secondary | ICD-10-CM

## 2014-02-22 DIAGNOSIS — A499 Bacterial infection, unspecified: Secondary | ICD-10-CM

## 2014-02-22 DIAGNOSIS — J329 Chronic sinusitis, unspecified: Secondary | ICD-10-CM | POA: Insufficient documentation

## 2014-02-22 MED ORDER — HYDROCODONE-HOMATROPINE 5-1.5 MG/5ML PO SYRP: 5.0000 mL | ORAL_SOLUTION | Freq: Three times a day (TID) | ORAL | Status: DC | PRN

## 2014-02-22 MED ORDER — AMOXICILLIN-POT CLAVULANATE 875-125 MG PO TABS
1.0000 | ORAL_TABLET | Freq: Two times a day (BID) | ORAL | Status: DC
Start: 2014-02-22 — End: 2014-05-24

## 2014-02-22 NOTE — Progress Notes (Signed)
   Subjective:    Patient ID: Sheila Morales, female    DOB: 05/17/1950, 63 y.o.   MRN: [id number]  Chief Complaint  Patient presents with  . Cough    Nasal congestion, productive cough, x2 weeks    HPI:  Sheila Morales is a 63 y.o. female who presents today for a cough and nasal congestion.   Acute symptoms have been going on for about 2 weeks. Currently having sinus pressure, productive cough (clear),  Denies any fever, chills, nausea, vomiting, headaches, or body aches. Cough is severe enough that keeps her up at night. Has tried OTC sudafed 24 hour with allergy pills. Denies anything that makes it better or worse.  No Known Allergies  Current Outpatient Prescriptions on File Prior to Visit  Medication Sig Dispense Refill  . calcium carbonate (TUMS - DOSED IN MG ELEMENTAL CALCIUM) 500 MG chewable tablet Chew 1 tablet by mouth daily.      . cholecalciferol (VITAMIN D) 1000 UNITS tablet Take 1,000 Units by mouth daily.      . hydroxychloroquine (PLAQUENIL) 200 MG tablet Take 200 mg by mouth 2 (two) times daily.    . MULTIPLE VITAMIN PO Take 1 tablet by mouth daily.      . naproxen sodium (ANAPROX) 220 MG tablet Take 220 mg by mouth.    . vitamin C (ASCORBIC ACID) 500 MG tablet Take 500 mg by mouth daily.      Marland Kitchen zolpidem (AMBIEN) 10 MG tablet TAKE ONE HALF TABLET BY MOUTH AT BEDTIME AS NEEDED 15 tablet 1   No current facility-administered medications on file prior to visit.    Review of Systems    See HPI  Objective:    BP 162/96 mmHg  Pulse 102  Temp(Src) 98.2 F (36.8 C) (Oral)  Resp 18  Ht 5\' 3"  (1.6 m)  Wt 134 lb 6.4 oz (60.963 kg)  BMI 23.81 kg/m2  SpO2 97% Nursing note and vital signs reviewed.  Physical Exam  Constitutional: She is oriented to person, place, and time. She appears well-developed and well-nourished. No distress.  HENT:  Head: Normocephalic.  Right Ear: Hearing, tympanic membrane, external ear and ear canal normal.  Left Ear: Hearing, tympanic  membrane, external ear and ear canal normal.  Nose: Right sinus exhibits maxillary sinus tenderness and frontal sinus tenderness. Left sinus exhibits maxillary sinus tenderness and frontal sinus tenderness.  Mouth/Throat: Uvula is midline, oropharynx is clear and moist and mucous membranes are normal.  Cardiovascular: Normal rate, regular rhythm, normal heart sounds and intact distal pulses.   Pulmonary/Chest: Effort normal and breath sounds normal.  Lymphadenopathy:    She has cervical adenopathy.  Neurological: She is alert and oriented to person, place, and time.  Skin: Skin is warm and dry.  Psychiatric: She has a normal mood and affect. Her behavior is normal. Judgment and thought content normal.       Assessment & Plan:

## 2014-02-22 NOTE — Assessment & Plan Note (Signed)
Symptoms and exam consistent with bacterial sinusitis. Start Augmentin x 10 days and hycodan as needed for sleep. Continue over the counter medications as needed for symptom relief. Follow up if symptoms worsen or fail to improve.

## 2014-02-22 NOTE — Progress Notes (Signed)
Pre visit review using our clinic review tool, if applicable. No additional management support is needed unless otherwise documented below in the visit note. 

## 2014-02-22 NOTE — Patient Instructions (Signed)
Thank you for choosing [facility].  Summary/Instructions:   Your prescription has been sent to your pharmacy.   Please continue over the counter medications as needed.  General Recommendations: Please drink plenty of fluids. Sleep in humidified air if possible. Use saline nasal sprays or the Netti pot as needed.   Medicaitons:  Flonase as needed to assist with congestion. You may use Mucinex to help break up congestion as you feel is needed. Tylenol as needed for fever. Sudafed for congestion.   Sinusitis Sinusitis is redness, soreness, and inflammation of the paranasal sinuses. Paranasal sinuses are air pockets within the bones of your face (beneath the eyes, the middle of the forehead, or above the eyes). In healthy paranasal sinuses, mucus is able to drain out, and air is able to circulate through them by way of your nose. However, when your paranasal sinuses are inflamed, mucus and air can become trapped. This can allow bacteria and other germs to grow and cause infection. Sinusitis can develop quickly and last only a short time (acute) or continue over a long period (chronic). Sinusitis that lasts for more than 12 weeks is considered chronic.  CAUSES  Causes of sinusitis include:  Allergies.  Structural abnormalities, such as displacement of the cartilage that separates your nostrils (deviated septum), which can decrease the air flow through your nose and sinuses and affect sinus drainage.  Functional abnormalities, such as when the small hairs (cilia) that line your sinuses and help remove mucus do not work properly or are not present. SIGNS AND SYMPTOMS  Symptoms of acute and chronic sinusitis are the same. The primary symptoms are pain and pressure around the affected sinuses. Other symptoms include:  Upper toothache.  Earache.  Headache.  Bad breath.  Decreased sense of smell and taste.  A cough, which worsens when you are lying  flat.  Fatigue.  Fever.  Thick drainage from your nose, which often is green and may contain pus (purulent).  Swelling and warmth over the affected sinuses. DIAGNOSIS  Your health care provider will perform a physical exam. During the exam, your health care provider may:  Look in your nose for signs of abnormal growths in your nostrils (nasal polyps).  Tap over the affected sinus to check for signs of infection.  View the inside of your sinuses (endoscopy) using an imaging device that has a light attached (endoscope). If your health care provider suspects that you have chronic sinusitis, one or more of the following tests may be recommended:  Allergy tests.  Nasal culture. A sample of mucus is taken from your nose, sent to a lab, and screened for bacteria.  Nasal cytology. A sample of mucus is taken from your nose and examined by your health care provider to determine if your sinusitis is related to an allergy. TREATMENT  Most cases of acute sinusitis are related to a viral infection and will resolve on their own within 10 days. Sometimes medicines are prescribed to help relieve symptoms (pain medicine, decongestants, nasal steroid sprays, or saline sprays).  However, for sinusitis related to a bacterial infection, your health care provider will prescribe antibiotic medicines. These are medicines that will help kill the bacteria causing the infection.  Rarely, sinusitis is caused by a fungal infection. In theses cases, your health care provider will prescribe antifungal medicine. For some cases of chronic sinusitis, surgery is needed. Generally, these are cases in which sinusitis recurs more than 3 times per year, despite other treatments. HOME CARE INSTRUCTIONS  Drink plenty of water. Water helps thin the mucus so your sinuses can drain more easily.  Use a humidifier.  Inhale steam 3 to 4 times a day (for example, sit in the bathroom with the shower running).  Apply a warm,  moist washcloth to your face 3 to 4 times a day, or as directed by your health care provider.  Use saline nasal sprays to help moisten and clean your sinuses.  Take medicines only as directed by your health care provider.  If you were prescribed either an antibiotic or antifungal medicine, finish it all even if you start to feel better. SEEK IMMEDIATE MEDICAL CARE IF:  You have increasing pain or severe headaches.  You have nausea, vomiting, or drowsiness.  You have swelling around your face.  You have vision problems.  You have a stiff neck.  You have difficulty breathing. MAKE SURE YOU:   Understand these instructions.  Will watch your condition.  Will get help right away if you are not doing well or get worse. Document Released: 04/08/2005 Document Revised: 08/23/2013 Document Reviewed: 04/23/2011 [facility] Patient Information 2015 [place], [place]. This information is not intended to replace advice given to you by your health care provider. Make sure you discuss any questions you have with your health care provider.

## 2014-02-23 ENCOUNTER — Telehealth: Payer: Self-pay | Admitting: Internal Medicine

## 2014-02-23 NOTE — Telephone Encounter (Signed)
emmi maield

## 2014-03-25 ENCOUNTER — Telehealth: Payer: Self-pay | Admitting: Internal Medicine

## 2014-03-25 NOTE — Telephone Encounter (Signed)
Pt calling to  check on pre-auth status of Ambien, pt is out of this medication.

## 2014-03-25 NOTE — Telephone Encounter (Signed)
Pt all set, pharmacy just called pt.

## 2014-05-19 ENCOUNTER — Telehealth: Payer: Self-pay | Admitting: Internal Medicine

## 2014-05-19 NOTE — Telephone Encounter (Signed)
appt is scheduled with the Patient to see Tye Savoy RNP on 05/24/14 2:00.

## 2014-05-23 ENCOUNTER — Encounter: Payer: Self-pay | Admitting: *Deleted

## 2014-05-24 ENCOUNTER — Encounter: Payer: Self-pay | Admitting: Nurse Practitioner

## 2014-05-24 ENCOUNTER — Other Ambulatory Visit (INDEPENDENT_AMBULATORY_CARE_PROVIDER_SITE_OTHER): Payer: Self-pay

## 2014-05-24 ENCOUNTER — Ambulatory Visit (INDEPENDENT_AMBULATORY_CARE_PROVIDER_SITE_OTHER): Payer: Self-pay | Admitting: Nurse Practitioner

## 2014-05-24 VITALS — BP 140/80 | HR 78 | Ht 63.0 in | Wt 134.4 lb

## 2014-05-24 DIAGNOSIS — R110 Nausea: Secondary | ICD-10-CM

## 2014-05-24 DIAGNOSIS — R1013 Epigastric pain: Secondary | ICD-10-CM

## 2014-05-24 DIAGNOSIS — Z800 Family history of malignant neoplasm of digestive organs: Secondary | ICD-10-CM

## 2014-05-24 DIAGNOSIS — R194 Change in bowel habit: Secondary | ICD-10-CM

## 2014-05-24 LAB — COMPREHENSIVE METABOLIC PANEL
ALBUMIN: 4.3 g/dL (ref 3.5–5.2)
ALT: 23 U/L (ref 0–35)
AST: 27 U/L (ref 0–37)
Alkaline Phosphatase: 81 U/L (ref 39–117)
BUN: 14 mg/dL (ref 6–23)
CHLORIDE: 104 meq/L (ref 96–112)
CO2: 33 meq/L — AB (ref 19–32)
CREATININE: 0.66 mg/dL (ref 0.40–1.20)
Calcium: 9.8 mg/dL (ref 8.4–10.5)
GFR: 95.66 mL/min (ref >60.00)
GLUCOSE: 85 mg/dL (ref 70–99)
POTASSIUM: 4.4 meq/L (ref 3.5–5.1)
SODIUM: 139 meq/L (ref 135–145)
TOTAL PROTEIN: 7.7 g/dL (ref 6.0–8.3)
Total Bilirubin: 0.4 mg/dL (ref 0.2–1.2)

## 2014-05-24 LAB — CBC WITH DIFFERENTIAL/PLATELET
BASOS ABS: 0 10*3/uL (ref 0.0–0.1)
Basophils Relative: 0.3 % (ref 0.0–3.0)
EOS PCT: 1.3 % (ref 0.0–5.0)
Eosinophils Absolute: 0.1 10*3/uL (ref 0.0–0.7)
HEMATOCRIT: 40.5 % (ref 36.0–46.0)
Hemoglobin: 14.1 g/dL (ref 12.0–15.0)
LYMPHS ABS: 1.7 10*3/uL (ref 0.7–4.0)
Lymphocytes Relative: 28.9 % (ref 12.0–46.0)
MCHC: 34.9 g/dL (ref 30.0–36.0)
MCV: 88 fl (ref 78.0–100.0)
MONOS PCT: 12.9 % — AB (ref 3.0–12.0)
Monocytes Absolute: 0.8 10*3/uL (ref 0.1–1.0)
NEUTROS ABS: 3.4 10*3/uL (ref 1.4–7.7)
Neutrophils Relative %: 56.6 % (ref 43.0–77.0)
Platelets: 200 10*3/uL (ref 150.0–400.0)
RBC: 4.6 Mil/uL (ref 3.87–5.11)
RDW: 12.8 % (ref 11.5–15.5)
WBC: 5.9 10*3/uL (ref 4.0–10.5)

## 2014-05-24 NOTE — Progress Notes (Signed)
HPI :  Patient is a 64 year old female known to Dr. Carlean Purl. She has a history of ischemic colitis and adenomatous colon polyps. Her last colonoscopy was March 2009 at which time multiple hyperplastic and adenomatous polyps were removed. We have sent patient colonoscopy recall letters, last one October 2014.  Patient is worked in for evaluation of loose stool. She has has chronic loose stool since cholecystectomy a few year back but over the last year the frequency has increased. Over the last few months it has gotten even worse. Now having 4-5 loose, non-bloody BMs /day. No dietary or medication changes. She has associated cramps relieved with defecation. No recent antibiotics. No recent medication or dietary changes.  Over the last month patient has developed problems with epigastric discomfort radiating through to her back, nausea and vomiting and postprandial bloating. She vomited 3 times last month. Eating helps the nausea. She has postprandial bloating but doesn't notice any distention.    Past Medical History  Diagnosis Date  . Systemic lupus erythematosus   . H/O: rheumatic fever   . Colitis, ischemic   . Restless leg syndrome     questionable  . IBS (irritable bowel syndrome)   . Personal history colonic adenoma 03/25/2008    06/2007 right sided adenoma and 2 right hyperplastic polyps    . 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 Arthritis   . External hemorrhoids   . Tubular adenoma of colon     Family History  Problem Relation Age of Onset  . Atrial fibrillation Father   . Stroke Mother   . Colon cancer Father    History  Substance Use Topics  . Smoking status: Current Every Day Smoker -- 1.00 packs/day for 30 years    Types: Cigarettes  . Smokeless tobacco: Never Used  . Alcohol Use: 2.5 oz/week    5 drink(s) per week   Current Outpatient Prescriptions  Medication Sig Dispense Refill  . cholecalciferol (VITAMIN D) 1000 UNITS tablet Take 1,000 Units by mouth  daily.      Marland Kitchen HYDROcodone-homatropine (HYCODAN) 5-1.5 MG/5ML syrup Take 5 mLs by mouth every 8 (eight) hours as needed for cough. 120 mL 0  . hydroxychloroquine (PLAQUENIL) 200 MG tablet Take 200 mg by mouth 2 (two) times daily.    . MULTIPLE VITAMIN PO Take 1 tablet by mouth daily.      . naproxen sodium (ANAPROX) 220 MG tablet Take 220 mg by mouth.    . Probiotic Product (PROBIOTIC DAILY PO) Take by mouth.    . vitamin C (ASCORBIC ACID) 500 MG tablet Take 500 mg by mouth daily.      Marland Kitchen zolpidem (AMBIEN) 10 MG tablet TAKE ONE HALF TABLET BY MOUTH AT BEDTIME AS NEEDED 15 tablet 1   No current facility-administered medications for this visit.   No Known Allergies   Review of Systems: All systems reviewed and negative except where noted in HPI.   Physical Exam: BP 140/80 mmHg  Pulse 78  Ht 5\' 3"  (1.6 m)  Wt 134 lb 6.4 oz (60.963 kg)  BMI 23.81 kg/m2 Constitutional: Pleasant,well-developed, white female in no acute distress. HEENT: Normocephalic and atraumatic. Conjunctivae are normal. No scleral icterus. Neck supple.  Cardiovascular: Normal rate, regular rhythm.  Pulmonary/chest: Effort normal and breath sounds normal. No wheezing, rales or rhonchi. Abdominal: Soft, nondistended, nontender. Bowel sounds active throughout. There are no masses palpable. No hepatomegaly. Extremities: no edema Lymphadenopathy: No cervical adenopathy noted. Neurological: Alert  and oriented to person place and time. Skin: Skin is warm and dry. No rashes noted. Psychiatric: Normal mood and affect. Behavior is normal.   ASSESSMENT AND PLAN:  51. 64 year old female with progressive bowel changes ( increase in frequency of chronically loose stools). She has associated cramping. Suspect functional but will obtain stool studies. Also she is overdue for colonoscopy so random biopsies could done at that time to rule out underlying microscopic colitis.   2. One month history of nausea with intermittent vomiting,  epigastric aching, and postprandial bloating. She is post cholecystectomy. On chronic PPI therapy. Takes NSAIDS sparingly. No unusual weight loss. For further evaluation will check basic labs.  EGD will be done at time of surveillance colonoscopy. The benefits, risks, and potential complications of EGD with possible biopsies  were discussed with the patient and she agrees to proceed.   3. History of adenomatous colon polyps, overdue for surveillance. Patient ready to proceed. The risks, benefits, and alternatives to colonoscopy with possible biopsy and possible polypectomy were discussed with the patient and she consents to proceed.

## 2014-05-24 NOTE — Patient Instructions (Addendum)
You have been scheduled for an endoscopy and colonoscopy. Please follow the written instructions given to you at your visit today. Please pick up your prep at the pharmacy within the next 1-3 days. If you use inhalers (even only as needed), please bring them with you on the day of your procedure. Your physician has requested that you go to www.[email or web address] and enter the access code given to you at your visit today. This web site gives a general overview about your procedure. However, you should still follow specific instructions given to you by our office regarding your preparation for the procedure.  Your physician has requested that you go to the basement for lab work before leaving today

## 2014-05-25 ENCOUNTER — Other Ambulatory Visit: Payer: Self-pay | Admitting: Geriatric Medicine

## 2014-05-26 ENCOUNTER — Other Ambulatory Visit: Payer: Self-pay | Admitting: Nurse Practitioner

## 2014-05-26 ENCOUNTER — Other Ambulatory Visit: Payer: Self-pay

## 2014-05-26 ENCOUNTER — Other Ambulatory Visit: Payer: Self-pay | Admitting: Geriatric Medicine

## 2014-05-26 ENCOUNTER — Telehealth: Payer: Self-pay | Admitting: Internal Medicine

## 2014-05-26 DIAGNOSIS — R194 Change in bowel habit: Secondary | ICD-10-CM | POA: Insufficient documentation

## 2014-05-26 DIAGNOSIS — R1013 Epigastric pain: Secondary | ICD-10-CM | POA: Insufficient documentation

## 2014-05-26 DIAGNOSIS — R110 Nausea: Secondary | ICD-10-CM | POA: Insufficient documentation

## 2014-05-26 MED ORDER — ZOLPIDEM TARTRATE 5 MG PO TABS: 5.0000 mg | ORAL_TABLET | Freq: Every evening | ORAL | Status: DC | PRN

## 2014-05-26 NOTE — Telephone Encounter (Signed)
Patient notified that labs were sent to Dr. Trudie Reed as sugested

## 2014-05-26 NOTE — Progress Notes (Signed)
Agree w/ Ms. Hvozdovic's note and mangement.  

## 2014-05-27 LAB — OVA AND PARASITE EXAMINATION: OP: NONE SEEN

## 2014-05-27 LAB — C. DIFFICILE GDH AND TOXIN A/B
C. DIFFICILE GDH: NOT DETECTED
C. difficile Toxin A/B: NOT DETECTED

## 2014-05-27 LAB — FECAL LACTOFERRIN, QUANT: Lactoferrin: NEGATIVE

## 2014-07-05 ENCOUNTER — Encounter: Payer: Self-pay | Admitting: Internal Medicine

## 2014-07-05 ENCOUNTER — Ambulatory Visit (AMBULATORY_SURGERY_CENTER): Payer: Self-pay | Admitting: Internal Medicine

## 2014-07-05 VITALS — BP 165/90 | HR 77 | Temp 96.7°F | Resp 19 | Ht 63.0 in | Wt 134.0 lb

## 2014-07-05 DIAGNOSIS — R1013 Epigastric pain: Secondary | ICD-10-CM

## 2014-07-05 DIAGNOSIS — Z86010 Personal history of colonic polyps: Secondary | ICD-10-CM | POA: Insufficient documentation

## 2014-07-05 DIAGNOSIS — K621 Rectal polyp: Secondary | ICD-10-CM

## 2014-07-05 MED ORDER — SODIUM CHLORIDE 0.9 % IV SOLN: 500.0000 mL | INTRAVENOUS | Status: DC

## 2014-07-05 MED ORDER — OMEPRAZOLE 40 MG PO CPDR: 40.0000 mg | DELAYED_RELEASE_CAPSULE | Freq: Every day | ORAL | Status: DC

## 2014-07-05 MED ORDER — COLESTIPOL HCL 5 G PO GRAN: 5.0000 g | GRANULES | Freq: Every day | ORAL | Status: DC

## 2014-07-05 NOTE — Progress Notes (Signed)
To recovery, report given to Nyra Capes, RN. VSS.

## 2014-07-05 NOTE — Patient Instructions (Addendum)
1 polyp found and removed in the colon.  Moderate diverticulosis. Information given on polyps and diverticulosis.   Colestipol 5g with supper for diarrhea.  Normal upper endoscopy.  Increase Omeprazole to 40mg  daily.     YOU HAD AN ENDOSCOPIC PROCEDURE TODAY AT [facility]:   Refer to the procedure report that was given to you for any specific questions about what was found during the examination.  If the procedure report does not answer your questions, please call your gastroenterologist to clarify.  If you requested that your care partner not be given the details of your procedure findings, then the procedure report has been included in a sealed envelope for you to review at your convenience later.  YOU SHOULD EXPECT: Some feelings of bloating in the abdomen. Passage of more gas than usual.  Walking can help get rid of the air that was put into your GI tract during the procedure and reduce the bloating. If you had a lower endoscopy (such as a colonoscopy or flexible sigmoidoscopy) you may notice spotting of blood in your stool or on the toilet paper. If you underwent a bowel prep for your procedure, you may not have a normal bowel movement for a few days.  Please Note:  You might notice some irritation and congestion in your nose or some drainage.  This is from the oxygen used during your procedure.  There is no need for concern and it should clear up in a day or so.  SYMPTOMS TO REPORT IMMEDIATELY:   Following lower endoscopy (colonoscopy or flexible sigmoidoscopy):  Excessive amounts of blood in the stool  Significant tenderness or worsening of abdominal pains  Swelling of the abdomen that is new, acute  Fever of 100F or higher   Following upper endoscopy (EGD)  Vomiting of blood or coffee ground material  New chest pain or pain under the shoulder blades  Painful or persistently difficult swallowing  New shortness of breath  Fever of 100F or higher  Black,  tarry-looking stools  For urgent or emergent issues, a gastroenterologist can be reached at any hour by calling [phone].   DIET: Your first meal following the procedure should be a small meal and then it is ok to progress to your normal diet. Heavy or fried foods are harder to digest and may make you feel nauseous or bloated.  Likewise, meals heavy in dairy and vegetables can increase bloating.  Drink plenty of fluids but you should avoid alcoholic beverages for 24 hours.  ACTIVITY:  You should plan to take it easy for the rest of today and you should NOT DRIVE or use heavy machinery until tomorrow (because of the sedation medicines used during the test).    FOLLOW UP: Our staff will call the number listed on your records the next business day following your procedure to check on you and address any questions or concerns that you may have regarding the information given to you following your procedure. If we do not reach you, we will leave a message.  However, if you are feeling well and you are not experiencing any problems, there is no need to return our call.  We will assume that you have returned to your regular daily activities without incident.  If any biopsies were taken you will be contacted by phone or by letter within the next 1-3 weeks.  Please call us at [phone] if you have not heard about the biopsies in 3 weeks.  SIGNATURES/CONFIDENTIALITY: You and/or your care partner have signed paperwork which will be entered into your electronic medical record.  These signatures attest to the fact that that the information above on your After Visit Summary has been reviewed and is understood.  Full responsibility of the confidentiality of this discharge information lies with you and/or your care-partner.

## 2014-07-05 NOTE — Op Note (Signed)
[place]  [address] [place], [id number]   ENDOSCOPY PROCEDURE REPORT  PATIENT: Sheila, Morales  MR#: [id number] BIRTHDATE: April 08, 1951 , 19  yrs. old GENDER: female ENDOSCOPIST: Gatha Mayer, MD, [facility] PROCEDURE DATE:  07/05/2014 PROCEDURE:  EGD, diagnostic ASA CLASS:     Class II INDICATIONS:  epigastric pain. MEDICATIONS: Propofol 130 mg IV and Monitored anesthesia care TOPICAL ANESTHETIC: none  DESCRIPTION OF PROCEDURE: After the risks benefits and alternatives of the procedure were thoroughly explained, informed consent was obtained.  The LB [id number] endoscope was introduced through the mouth and advanced to the second portion of the duodenum , Without limitations.  The instrument was slowly withdrawn as the mucosa was fully examined.      EXAM: The esophagus and gastroesophageal junction were completely normal in appearance.  The stomach was entered and closely examined.The antrum, angularis, and lesser curvature were well visualized, including a retroflexed view of the cardia and fundus. The stomach wall was normally distensable.  The scope passed easily through the pylorus into the duodenum.  Retroflexed views revealed no abnormalities.     The scope was then withdrawn from the patient and the procedure completed.  COMPLICATIONS: There were no immediate complications.  ENDOSCOPIC IMPRESSION: Normal appearing esophagus and GE junction, the stomach was well visualized and normal in appearance, normal appearing duodenum  RECOMMENDATIONS: 1.  Proceed with a Colonoscopy. 2.  Increase omeprazole to 40 mg daily   eSigned:  Gatha Mayer, MD, [facility] 07/05/2014 4:50 PM    CC:The Patient       Jannette Fogo. MD

## 2014-07-05 NOTE — Op Note (Signed)
[place]  [address] [place], [address]   COLONOSCOPY PROCEDURE REPORT  PATIENT: Sheila Morales, Sheila Morales  MR#: [id number] BIRTHDATE: 10-07-1950 , 48  yrs. old GENDER: female ENDOSCOPIST: Gatha Mayer, MD, [facility] PROCEDURE DATE:  07/05/2014 PROCEDURE:   Colonoscopy, surveillance and Colonoscopy with snare polypectomy First Screening Colonoscopy - Avg.  risk and is 50 yrs.  old or older - No.  Prior Negative Screening - Now for repeat screening. N/A  History of Adenoma - Now for follow-up colonoscopy & has been > or = to 3 yrs.  Yes hx of adenoma.  Has been 3 or more years since last colonoscopy. ASA CLASS:   Class II INDICATIONS:Surveillance due to prior colonic neoplasia and PH Colon Adenoma. MEDICATIONS: Propofol 160 mg IV and Monitored anesthesia care  DESCRIPTION OF PROCEDURE:   After the risks benefits and alternatives of the procedure were thoroughly explained, informed consent was obtained.  The digital rectal exam revealed no abnormalities of the rectum.   The LB [id number]  endoscope was introduced through the anus and advanced to the cecum, which was identified by both the appendix and ileocecal valve. No adverse events experienced.   The quality of the prep was excellent. (MiraLax was used)  The instrument was then slowly withdrawn as the colon was fully examined.   COLON FINDINGS: A sessile polyp measuring 5 mm in size was found in the rectum.  A polypectomy was performed with a cold snare.  The resection was complete, the polyp tissue was completely retrieved and sent to histology.   There was moderate diverticulosis noted in the sigmoid colon.   The examination was otherwise normal. Retroflexed views revealed no abnormalities. The time to cecum = 2.3 Withdrawal time = 9.7   The scope was withdrawn and the procedure completed. COMPLICATIONS: There were no immediate complications.  ENDOSCOPIC IMPRESSION: 1.   Sessile polyp was found in the  rectum; polypectomy was performed with a cold snare 2.   Moderate diverticulosis was noted in the sigmoid colon 3.   The examination was otherwise normal  RECOMMENDATIONS: 1.  Timing of repeat colonoscopy will be determined by pathology findings. 2.  Colestipol 5 g with supper for diarrhea  eSigned:  Gatha Mayer, MD, [facility] 07/05/2014 4:54 PM   cc: The Patient and Jannette Fogo, MD

## 2014-07-05 NOTE — Progress Notes (Signed)
Called to room to assist during endoscopic procedure.  Patient ID and intended procedure confirmed with present staff. Received instructions for my participation in the procedure from the performing physician.  

## 2014-07-06 ENCOUNTER — Telehealth: Payer: Self-pay | Admitting: *Deleted

## 2014-07-06 NOTE — Telephone Encounter (Signed)
  Follow up Call-  Call back number 07/05/2014  Post procedure Call Back phone  # [phone]   Permission to leave phone message Yes     Patient questions:  Do you have a fever, pain , or abdominal swelling? No. Pain Score  0 *  Have you tolerated food without any problems? Yes.    Have you been able to return to your normal activities? Yes.    Do you have any questions about your discharge instructions: Diet   No. Medications  No. Follow up visit  No.  Do you have questions or concerns about your Care? Yes.    Actions: * If pain score is 4 or above: No action needed, pain <4.      Dr. Carlean Purl, pt states that she has not been taking any omeprazole.  She and her husband are concerned about the cost of omeprazole 40mg .  They purchased 20mg . OTC.  She would like to start with 20mg  to see if this will help with her discomfort.  They know to increase to 40mg .iIf this doesn't alleviate the symptoms.  Is this OK? Sheila Morales

## 2014-07-06 NOTE — Telephone Encounter (Signed)
That is fine 

## 2014-07-13 ENCOUNTER — Other Ambulatory Visit: Payer: Self-pay | Admitting: Internal Medicine

## 2014-07-13 ENCOUNTER — Encounter: Payer: Self-pay | Admitting: Internal Medicine

## 2014-07-13 NOTE — Progress Notes (Signed)
Quick Note:  Distal hyperplastic polyp - repeat colonoscopy 2026 (only 1 small adenoma 2009 colonoscopy) ______

## 2014-07-14 ENCOUNTER — Other Ambulatory Visit: Payer: Self-pay | Admitting: Internal Medicine

## 2014-09-06 ENCOUNTER — Telehealth: Payer: Self-pay | Admitting: Internal Medicine

## 2014-09-06 NOTE — Telephone Encounter (Signed)
Patient reports when she took Colestipol 1 packet every AM, it "binds me up." She tried 1/2 packet and has 3-4 loose stools in AM. She is going to try taking one packet every other day and see if this will work for her. She will call back for problems.

## 2014-09-16 ENCOUNTER — Telehealth: Payer: Self-pay | Admitting: Internal Medicine

## 2014-09-16 NOTE — Telephone Encounter (Signed)
Please ask her to read about Lotronex. Advise has some scary possible but rare side effects. If having abdominal cramps and her sxs it may be a better medication for her.  Perhaps she can Google it and read on web and I will contact her next week to discuss it more.  If she has never tried dicyclomine can try that - 20 mg qac # 90 no refill

## 2014-09-16 NOTE — Telephone Encounter (Signed)
Patient reports that colestipol is not working.  A whole packet causes constipation and abdominal pain, a 1/2 packet she has diarrhea 3-4 times in the am.  She tried alternating days of a whole packet, but she had abdominal pain. Dr. Carlean Purl, she is asking if there is an alternative medication she can try?  She is advised to try imodium one time a day until Dr. Carlean Purl can review and advise.  I did explain that he is not in the office today and that the office is closed on Monday, that I may not call back until Tuesday.

## 2014-09-20 ENCOUNTER — Telehealth: Payer: Self-pay | Admitting: Internal Medicine

## 2014-09-20 MED ORDER — DICYCLOMINE HCL 20 MG PO TABS: 20.0000 mg | ORAL_TABLET | Freq: Three times a day (TID) | ORAL | Status: DC

## 2014-09-20 NOTE — Telephone Encounter (Signed)
Patient notified to stop the colestipol.

## 2014-09-20 NOTE — Telephone Encounter (Signed)
Patient notified of recommendations I mailed her information on Lotronex.  She will look it up with her husband also once the packet comes in the mail Rx sent to her pharmacy for dicyclomine

## 2014-09-20 NOTE — Telephone Encounter (Signed)
Patient is scheduled for follow up for 11/10/14 11:30

## 2014-09-20 NOTE — Telephone Encounter (Signed)
Let's get her a July or Aug appt please

## 2014-09-21 ENCOUNTER — Telehealth: Payer: Self-pay | Admitting: Geriatric Medicine

## 2014-09-21 NOTE — Telephone Encounter (Signed)
I called to speak with patient to ask her if she has had a mammogram. She does not have insurance at this time. She will get insurance the first of July. She will get one when she has insurance.

## 2014-09-23 ENCOUNTER — Telehealth: Payer: Self-pay | Admitting: Internal Medicine

## 2014-09-23 NOTE — Telephone Encounter (Signed)
yes

## 2014-09-23 NOTE — Telephone Encounter (Signed)
Left patient detailed message with an answer to her question about generic bentyl and an anacids.

## 2014-09-23 NOTE — Telephone Encounter (Signed)
Will this be ok Sir?

## 2014-09-26 ENCOUNTER — Telehealth: Payer: Self-pay | Admitting: Internal Medicine

## 2014-09-26 MED ORDER — DICYCLOMINE HCL 10 MG PO CAPS: 10.0000 mg | ORAL_CAPSULE | Freq: Three times a day (TID) | ORAL | Status: DC

## 2014-09-26 NOTE — Telephone Encounter (Signed)
Patient states that the 20 mg dicyclomine is too strong. Per Nicoletta Ba PA ok to send in 10 mg and start with 1 qd can advance to TID if needed for the diarrhea.  She is advised.  New rx sent

## 2014-11-10 ENCOUNTER — Encounter: Payer: Self-pay | Admitting: Internal Medicine

## 2014-11-10 ENCOUNTER — Ambulatory Visit (INDEPENDENT_AMBULATORY_CARE_PROVIDER_SITE_OTHER): Payer: Medicare Other | Admitting: Internal Medicine

## 2014-11-10 VITALS — BP 110/64 | HR 60 | Ht 63.0 in | Wt 140.0 lb

## 2014-11-10 DIAGNOSIS — K589 Irritable bowel syndrome without diarrhea: Secondary | ICD-10-CM | POA: Diagnosis not present

## 2014-11-10 MED ORDER — ALOSETRON HCL 0.5 MG PO TABS: 0.5000 mg | ORAL_TABLET | Freq: Two times a day (BID) | ORAL | Status: DC

## 2014-11-10 NOTE — Patient Instructions (Signed)
  We have sent the following medications to your pharmacy for you to pick up at your convenience: Lotronex, start out taking one daily.  Discontinue your dicyclomine per Dr. Carlean Purl.  Don't take Imodium while on the Lotronex.  I appreciate the opportunity to care for you. Silvano Rusk, MD, [facility]

## 2014-11-10 NOTE — Progress Notes (Signed)
   Subjective:    Patient ID: Sheila Morales, female    DOB: Aug 18, 1950, 64 y.o.   MRN: [id number] Chief complaint: Follow-up diarrhea with IBS HPI  Patient returns. She continues to have lower abdominal aching and cramping and intermittent diarrhea. It is improved on dicyclomine but she could not tolerate 20 mg so she is using 10 mg. Kenyon Ana cost constipation and she couldn't reduce the dose from 5 g daily without having diarrhea. Recent EGD and colonoscopy were unrevealing. He denies food as a trigger milk is not a problem. Stress does not seem to be an issue. She has had 2-3 cups of coffee every morning for years and has not any different in her medications are different. There is no nocturnal disturbance with the stools. Her appetite is off a bit. She describes some intermittent spells of nausea and dry heaves about once a month that less frequent as well as the diarrhea now that she is on dicyclomine. When she does have diarrhea she'll have a normal stool and then progressively more loose stools throughout the day. Still happening several times a week. Medications, allergies, past medical history, past surgical history, family history and social history are reviewed and updated in the EMR.  Review of Systems As above    Objective:   Physical Exam BP 110/64 mmHg  Pulse 60  Ht 5\' 3"  (1.6 m)  Wt 140 lb (63.504 kg)  BMI 24.81 kg/m2 Well-developed well-nourished middle-aged to elderly woman in no acute distress     Assessment & Plan:  Irritable Bowel Syndrome Has failed colestipol and dicyclomine Try alosetron 0.5 mg bid (may start qd) Call me in 1 month re: results - sooner if needed I reviewed potential adverse effects especially severe constipation and ischemic colitis and avoid loperanmide stop dicyclomine If doing well RTC 1 year   15 minutes time spent with patient > half in counseling coordination of care

## 2014-11-10 NOTE — Assessment & Plan Note (Signed)
Has failed colestipol and dicyclomine Try alosetron 0.5 mg bid (may start qd) Call me in 1 month re: results - sooner if needed I reviewed potential adverse effects especially severe constipation and ischemic colitis and avoid loperanmide stop dicyclomine If doing well RTC 1 year

## 2014-11-21 ENCOUNTER — Telehealth: Payer: Self-pay | Admitting: Internal Medicine

## 2014-11-21 ENCOUNTER — Other Ambulatory Visit: Payer: Medicare Other

## 2014-11-21 NOTE — Telephone Encounter (Signed)
Patient has jury duty on Monday of next week.  She is advised that Dr. Carlean Purl is not in the office this week and she should check with her primary care.

## 2014-11-22 ENCOUNTER — Encounter: Payer: Self-pay | Admitting: Internal Medicine

## 2014-12-07 ENCOUNTER — Ambulatory Visit (INDEPENDENT_AMBULATORY_CARE_PROVIDER_SITE_OTHER): Payer: Medicare Other | Admitting: Internal Medicine

## 2014-12-07 ENCOUNTER — Encounter: Payer: Self-pay | Admitting: Internal Medicine

## 2014-12-07 VITALS — BP 130/82 | HR 96 | Temp 98.1°F | Ht 63.0 in | Wt 137.0 lb

## 2014-12-07 DIAGNOSIS — R059 Cough, unspecified: Secondary | ICD-10-CM

## 2014-12-07 DIAGNOSIS — M329 Systemic lupus erythematosus, unspecified: Secondary | ICD-10-CM

## 2014-12-07 DIAGNOSIS — R062 Wheezing: Secondary | ICD-10-CM | POA: Diagnosis not present

## 2014-12-07 DIAGNOSIS — R05 Cough: Secondary | ICD-10-CM

## 2014-12-07 DIAGNOSIS — J410 Simple chronic bronchitis: Secondary | ICD-10-CM | POA: Insufficient documentation

## 2014-12-07 MED ORDER — PREDNISONE 10 MG PO TABS: ORAL_TABLET | ORAL | Status: DC

## 2014-12-07 MED ORDER — LEVOFLOXACIN 250 MG PO TABS: 250.0000 mg | ORAL_TABLET | Freq: Every day | ORAL | Status: DC

## 2014-12-07 MED ORDER — HYDROCODONE-HOMATROPINE 5-1.5 MG/5ML PO SYRP: 5.0000 mL | ORAL_SOLUTION | Freq: Four times a day (QID) | ORAL | Status: DC | PRN

## 2014-12-07 NOTE — Patient Instructions (Signed)
Please take all new medication as prescribed - the antibiotic, cough medicine, and prednsone  Please continue all other medications as before, and refills have been done if requested.  Please have the pharmacy call with any other refills you may need.  Please keep your appointments with your specialists as you may have planned

## 2014-12-07 NOTE — Progress Notes (Signed)
Subjective:    Patient ID: Sheila Morales, female    DOB: [birth year], 64 y.o.   MRN: [id number]  HPI Here with acute onset mild to mod 2-3 days ST, HA, general weakness and malaise, with prod cough greenish sputum, but Pt denies chest pain, increased sob or doe, wheezing, orthopnea, PND, increased LE swelling, palpitations, dizziness or syncope, except for mild wheezing onset last night with mild sob.  Also with rash flare related to SLE recent to torso and extremities without itching, typical for her. Past Medical History  Diagnosis Date  . Systemic lupus erythematosus   . H/O: rheumatic fever   . Colitis, ischemic   . Restless leg syndrome     questionable  . IBS (irritable bowel syndrome)   . Personal history colonic adenoma 03/25/2008    06/2007 right sided adenoma and 2 right hyperplastic polyps    . 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 Arthritis   . External hemorrhoids   . Tubular adenoma of colon    Past Surgical History  Procedure Laterality Date  . Lesion, vulva excision    . Tonsillectomy    . Cholecystectomy, laparoscopic    . Colonoscopy    . Esophagogastroduodenoscopy    . Cholecystectomy      reports that she has been smoking Cigarettes.  She has a 30 pack-year smoking history. She has never used smokeless tobacco. She reports that she drinks about 2.5 oz of alcohol per week. She reports that she does not use illicit drugs. family history includes Atrial fibrillation in her father; Colon cancer in her father; Stroke in her mother. No Known Allergies Current Outpatient Prescriptions on File Prior to Visit  Medication Sig Dispense Refill  . alosetron (LOTRONEX) 0.5 MG tablet Take 1 tablet (0.5 mg total) by mouth 2 (two) times daily. 60 tablet 0  . cholecalciferol (VITAMIN D) 1000 UNITS tablet Take 1,000 Units by mouth daily.      . hydroxychloroquine (PLAQUENIL) 200 MG tablet Take 200 mg by mouth 2 (two) times daily.    . MULTIPLE VITAMIN PO Take 1 tablet by mouth  daily.      . naproxen sodium (ANAPROX) 220 MG tablet Take 220 mg by mouth.    Marland Kitchen omeprazole (PRILOSEC) 40 MG capsule Take 1 capsule (40 mg total) by mouth daily before breakfast. 30 capsule 11  . Probiotic Product (PROBIOTIC DAILY PO) Take by mouth.    . vitamin C (ASCORBIC ACID) 500 MG tablet Take 500 mg by mouth daily.      Marland Kitchen zolpidem (AMBIEN) 5 MG tablet TAKE ONE TABLET BY MOUTH AT BEDTIME AS NEEDED FOR SLEEP 30 tablet 2   No current facility-administered medications on file prior to visit.    Review of Systems  Constitutional: Negative for unusual diaphoresis or night sweats HENT: Negative for ringing in ear or discharge Eyes: Negative for double vision or worsening visual disturbance.  Respiratory: Negative for choking and stridor.   Gastrointestinal: Negative for vomiting or other signifcant bowel change Genitourinary: Negative for hematuria or change in urine volume.  Musculoskeletal: Negative for other MSK pain or swelling Skin: Negative for color change and worsening wound.  Neurological: Negative for tremors and numbness other than noted  Psychiatric/Behavioral: Negative for decreased concentration or agitation other than above       Objective:   Physical Exam BP 130/82 mmHg  Pulse 96  Temp(Src) 98.1 F (36.7 C) (Oral)  Ht 5\' 3"  (1.6 m)  Wt  137 lb (62.143 kg)  BMI 24.27 kg/m2  SpO2 97% VS noted,  Constitutional: Pt appears in no significant distress HENT: Head: NCAT.  Right Ear: External ear normal.  Left Ear: External ear normal.  Eyes: . Pupils are equal, round, and reactive to light. Conjunctivae and EOM are normal Neck: Normal range of motion. Neck supple.  Cardiovascular: Normal rate and regular rhythm.   Pulmonary/Chest: Effort normal and breath sounds decreased without rales but with small bilat wheezing Neurological: Pt is alert. Not confused , motor grossly intact Skin: Skin is warm. Diffuse rash erythem rash with isolated nontender < 5 mm nonriased  lesions to prox arms and torso, no LE edema Psychiatric: Pt behavior is normal. No agitation.      Assessment & Plan:

## 2014-12-07 NOTE — Progress Notes (Signed)
Pre visit review using our clinic review tool, if applicable. No additional management support is needed unless otherwise documented below in the visit note. 

## 2014-12-08 DIAGNOSIS — R062 Wheezing: Secondary | ICD-10-CM | POA: Insufficient documentation

## 2014-12-08 NOTE — Assessment & Plan Note (Signed)
With mild discoid flare - possibly to improve with steroid as well,  to f/u any worsening symptoms or concerns

## 2014-12-08 NOTE — Assessment & Plan Note (Signed)
/  Mild to mod, for predpac asd,  to f/u any worsening symptoms or concerns 

## 2014-12-08 NOTE — Assessment & Plan Note (Signed)
C/w bronchitis acute, possibly bacterial, Mild to mod, for antibx course,  to f/u any worsening symptoms or concerns

## 2014-12-16 ENCOUNTER — Telehealth: Payer: Self-pay | Admitting: Internal Medicine

## 2014-12-16 NOTE — Telephone Encounter (Signed)
OK 

## 2014-12-16 NOTE — Telephone Encounter (Signed)
See message from the patient 

## 2015-01-06 ENCOUNTER — Encounter: Payer: Self-pay | Admitting: Internal Medicine

## 2015-01-06 ENCOUNTER — Ambulatory Visit (INDEPENDENT_AMBULATORY_CARE_PROVIDER_SITE_OTHER): Payer: Medicare Other | Admitting: Internal Medicine

## 2015-01-06 VITALS — BP 136/82 | HR 91 | Temp 97.9°F | Resp 18 | Wt 136.0 lb

## 2015-01-06 DIAGNOSIS — J209 Acute bronchitis, unspecified: Secondary | ICD-10-CM

## 2015-01-06 MED ORDER — AMOXICILLIN 500 MG PO CAPS
500.0000 mg | ORAL_CAPSULE | Freq: Three times a day (TID) | ORAL | Status: DC
Start: 2015-01-06 — End: 2015-01-24

## 2015-01-06 MED ORDER — BENZONATATE 200 MG PO CAPS: 200.0000 mg | ORAL_CAPSULE | Freq: Three times a day (TID) | ORAL | Status: DC | PRN

## 2015-01-06 NOTE — Progress Notes (Signed)
   Subjective:    Patient ID: Sheila Morales, female    DOB: March 20, 1951, 64 y.o.   MRN: [id number]  HPI She has had a cough for the last week which is productive of clear sputum. Earlier in the week she had some discomfort in the cervical lymph node chain which has resolved. She feels there still may be some residual swelling in the left neck.  She did have an infection in the left cervical lymph chain a month ago and took azithromycin.  Her cough is severe enough that she has dry heaves without vomiting. She has chronic nasal congestion. She has no other upper respiratory tract or extrinsic symptoms.  She has lupus which flared in April; she is on Plaquenil. She's been off steroids for several months.  She continues to smoke a pack a day. She also cleans houses.   Review of Systems Frontal headache, facial pain , nasal purulence, dental pain, sore throat , otic pain or otic discharge denied. No fever , chills or sweats. Extrinsic symptoms of itchy, watery eyes, sneezing, or angioedema are denied. There is no wheezing or  paroxysmal nocturnal dyspnea.     Objective:   Physical Exam She has diffuse erythematous irregular plaque like rash over the anterior chest. There is marked erythema of the nasal mucosa. Breath sounds are generally decreased.  General appearance:Adequately nourished; no acute distress or increased work of breathing is present.    Lymphatic: No  lymphadenopathy about the head, neck, or axilla .  Eyes: No conjunctival inflammation or lid edema is present. There is no scleral icterus.  Ears:  External ear exam shows no significant lesions or deformities.  Otoscopic examination reveals clear canals, tympanic membranes are intact bilaterally without bulging, retraction, inflammation or discharge.  Nose:  External nasal examination shows no deformity or inflammation.   Oral exam: Dental hygiene is good; lips and gums are healthy appearing.There is no oropharyngeal erythema  or exudate .  Neck:  No deformities, thyromegaly, masses, or tenderness noted.   Supple with full range of motion without pain.   Heart:  Normal rate and regular rhythm. S1 and S2 normal without gallop, murmur, click, rub or other extra sounds.   Lungs:Chest clear to auscultation; no wheezes, rhonchi,rales ,or rubs present.  Extremities:  No cyanosis, edema, or clubbing  noted    Skin: Warm & dry w/o tenting or jaundice.      Assessment & Plan:  #1 acute bronchitis w/o bronchospasm #2 rhinitis Plan: See orders and recommendations

## 2015-01-06 NOTE — Patient Instructions (Signed)
Plain Mucinex (NOT D) for thick secretions ;force NON dairy fluids .   Nasal cleansing in the shower as discussed with lather of mild shampoo.After 10 seconds wash off lather while  exhaling through nostrils. Make sure that all residual soap is removed to prevent irritation.  Flonase OR Nasacort AQ 1 spray in each nostril twice a day as needed. Use the "crossover" technique into opposite nostril spraying toward opposite ear @ 45 degree angle, not straight up into nostril.  Plain Allegra (NOT D )  160 daily , Loratidine 10 mg , OR Zyrtec 10 mg @ bedtime  as needed for itchy eyes & sneezing.   Reflux of gastric acid may be asymptomatic as this may occur mainly during sleep.The triggers for reflux  include stress; the "aspirin family" ; alcohol; peppermint; and caffeine (coffee, tea, cola, and chocolate). The aspirin family would include aspirin and the nonsteroidal agents such as ibuprofen &  Naproxen. Tylenol would not cause reflux. If having symptoms ; food & drink should be avoided for @ least 2 hours before going to bed.    Please think about quitting smoking. Review the risks we discussed. Please call 1-800-QUIT-NOW (226)277-6673) for free smoking cessation counseling. There are multiple options are to help you stop smoking. These include nicotine patches, nicotine gum, and the new "E cigarette".

## 2015-01-06 NOTE — Progress Notes (Signed)
Pre visit review using our clinic review tool, if applicable. No additional management support is needed unless otherwise documented below in the visit note. 

## 2015-01-10 ENCOUNTER — Telehealth: Payer: Self-pay | Admitting: Internal Medicine

## 2015-01-10 NOTE — Telephone Encounter (Signed)
Patient was in last week and seen Dr. Linus Orn with a pretty bad cough.  She states the pills she was given hasn't helped at all. Dr. Jenny Reichmann had given her a cough syrup in the past with hydrocodone and it worked really well for her.  She is requesting a prescription for that. Pharmacy is [facility] on [address]

## 2015-01-11 ENCOUNTER — Other Ambulatory Visit: Payer: Self-pay | Admitting: Internal Medicine

## 2015-01-11 MED ORDER — HYDROCODONE-HOMATROPINE 5-1.5 MG/5ML PO SYRP: 5.0000 mL | ORAL_SOLUTION | Freq: Four times a day (QID) | ORAL | Status: DC | PRN

## 2015-01-11 NOTE — Telephone Encounter (Signed)
Please advise 

## 2015-01-11 NOTE — Telephone Encounter (Signed)
See Rx 

## 2015-01-11 NOTE — Telephone Encounter (Signed)
Called pt to inform she would have to pick rx up. She will come in tomorrow to pick up rx

## 2015-01-24 ENCOUNTER — Encounter: Payer: Self-pay | Admitting: Internal Medicine

## 2015-01-24 ENCOUNTER — Ambulatory Visit (INDEPENDENT_AMBULATORY_CARE_PROVIDER_SITE_OTHER): Payer: Medicare Other | Admitting: Internal Medicine

## 2015-01-24 ENCOUNTER — Other Ambulatory Visit (INDEPENDENT_AMBULATORY_CARE_PROVIDER_SITE_OTHER): Payer: Medicare Other

## 2015-01-24 VITALS — BP 134/76 | HR 84 | Ht 62.6 in | Wt 134.0 lb

## 2015-01-24 DIAGNOSIS — K589 Irritable bowel syndrome without diarrhea: Secondary | ICD-10-CM

## 2015-01-24 DIAGNOSIS — K529 Noninfective gastroenteritis and colitis, unspecified: Secondary | ICD-10-CM

## 2015-01-24 DIAGNOSIS — R197 Diarrhea, unspecified: Secondary | ICD-10-CM | POA: Diagnosis not present

## 2015-01-24 LAB — IGA: IGA: 397 mg/dL — AB (ref 68–378)

## 2015-01-24 LAB — TSH: TSH: 1.1 u[IU]/mL (ref 0.35–4.50)

## 2015-01-24 MED ORDER — COLESTIPOL HCL 5 G PO GRAN: GRANULES | ORAL | Status: DC

## 2015-01-24 NOTE — Progress Notes (Signed)
   Subjective:    Patient ID: Sheila Morales, female    DOB: 1951/04/20, 64 y.o.   MRN: [id number] Cc: persistent diarrhea HPI  Is here for follow-up. She had been treated with alosetron for IBS diarrhea. Constipated on Abx, alosetron and narcotic cough syrup. Now off of the alosetron. It was also very costly she said $469. Having mostly loose stools but some alternating bowel habits. Reviewing things with her she did have improvement with colestipol in the past though she was either constipated or having diarrhea couldn't get the right dose. Her diarrhea problems seem to have become more of a problem or at least started perhaps after cholecystectomy years ago. Medications, allergies, past medical history, past surgical history, family history and social history are reviewed and updated in the EMR.   Review of Systems As above    Objective:   Physical Exam @BP  134/76 mmHg  Pulse 84  Ht 5' 2.6" (1.59 m)  Wt 134 lb (60.782 kg)  BMI 24.04 kg/m2@  General:  NAD Eyes:   anicteric Lungs:  clear Abdomen:  soft and nontender, BS+  Data Reviewed:  As per history of present illness Note she has had multiple colonoscopies she has had polyps, she has not had random colon biopsies though the pattern of diarrhea is not entirely consistent with microscopic colitis in my opinion     Assessment & Plan:  Chronic diarrhea  IBS (irritable bowel syndrome)  Colestipol 5 grams/2.5 grams alternating days to see if that gets her regimen. I believe since she has responded to this there all to be awaited titrated and I'm hopeful.  Will screen for celiac disease and also pancreatic insufficiency and check thyroid function as below  TTG Ab, IgA, TSH, fecal elastase  Follow-up to be arranged after lab review. As above I doubt random colon biopsies would help but could be needed depending upon the above. She was also asking about a CT scan which I recommended against. I don't think that would provide  useful information. I tried to reassure her as best as possible based upon all the available data today that her diagnosis is IBS or postcholecystectomy diarrhea or both which is more likely.   I appreciate the opportunity to care for this patient. CC: Hoyt Koch, MD

## 2015-01-24 NOTE — Patient Instructions (Addendum)
   Your physician has requested that you go to the basement for lab work before leaving today.   We have sent the following medications to your pharmacy for you to pick up at your convenience: Colestid    I appreciate the opportunity to care for you. Silvano Rusk, MD, [facility]

## 2015-01-25 LAB — TISSUE TRANSGLUTAMINASE, IGA: TISSUE TRANSGLUTAMINASE AB, IGA: 1 U/mL (ref <4)

## 2015-01-26 ENCOUNTER — Other Ambulatory Visit: Payer: Medicare Other

## 2015-01-26 ENCOUNTER — Other Ambulatory Visit: Payer: Self-pay

## 2015-01-26 DIAGNOSIS — K529 Noninfective gastroenteritis and colitis, unspecified: Secondary | ICD-10-CM

## 2015-01-26 DIAGNOSIS — R197 Diarrhea, unspecified: Secondary | ICD-10-CM | POA: Diagnosis not present

## 2015-01-31 ENCOUNTER — Telehealth: Payer: Self-pay | Admitting: Internal Medicine

## 2015-01-31 NOTE — Telephone Encounter (Signed)
Please advise on labs from 01/24/15.  Patient is calling for results

## 2015-01-31 NOTE — Telephone Encounter (Signed)
Thyroid ok and does not have celiac dz Waiting and fecal elastase test

## 2015-01-31 NOTE — Telephone Encounter (Signed)
Patient notified of results She is notified that we will call back with the results when available.

## 2015-02-01 DIAGNOSIS — R202 Paresthesia of skin: Secondary | ICD-10-CM | POA: Diagnosis not present

## 2015-02-01 DIAGNOSIS — R197 Diarrhea, unspecified: Secondary | ICD-10-CM | POA: Diagnosis not present

## 2015-02-01 DIAGNOSIS — M328 Other forms of systemic lupus erythematosus: Secondary | ICD-10-CM | POA: Diagnosis not present

## 2015-02-01 DIAGNOSIS — L932 Other local lupus erythematosus: Secondary | ICD-10-CM | POA: Diagnosis not present

## 2015-02-01 DIAGNOSIS — R5383 Other fatigue: Secondary | ICD-10-CM | POA: Diagnosis not present

## 2015-02-06 ENCOUNTER — Other Ambulatory Visit: Payer: Self-pay | Admitting: Internal Medicine

## 2015-02-08 LAB — PANCREATIC ELASTASE, FECAL: Pancreatic Elastase-1, Stool: >500 mcg/g

## 2015-02-08 NOTE — Progress Notes (Signed)
Quick Note:  Pancreatic elastase is normal - pancreas working ok [place] she is better on Colestipol 5/2.5 g alternating She can adjust for best effect and to not get constipation Let me know if any ?/if that is not working   ______

## 2015-02-16 ENCOUNTER — Encounter: Payer: Self-pay | Admitting: Internal Medicine

## 2015-02-16 ENCOUNTER — Ambulatory Visit (INDEPENDENT_AMBULATORY_CARE_PROVIDER_SITE_OTHER): Payer: Medicare Other | Admitting: Internal Medicine

## 2015-02-16 VITALS — BP 158/82 | HR 70 | Temp 97.8°F | Resp 16 | Ht 62.5 in | Wt 135.0 lb

## 2015-02-16 DIAGNOSIS — G609 Hereditary and idiopathic neuropathy, unspecified: Secondary | ICD-10-CM

## 2015-02-16 MED ORDER — FLUTICASONE PROPIONATE 50 MCG/ACT NA SUSP: 2.0000 | Freq: Every day | NASAL | Status: DC

## 2015-02-16 NOTE — Patient Instructions (Signed)
We have sent in flonase which is a nose spray for the sinuses to see if it helps with the cough. Use 2 sprays daily for 1-2 weeks to see if it helps the cough disappear.   We will get you in with the neurologist for the nerve test.

## 2015-02-16 NOTE — Progress Notes (Signed)
Pre visit review using our clinic review tool, if applicable. No additional management support is needed unless otherwise documented below in the visit note. 

## 2015-02-17 DIAGNOSIS — G629 Polyneuropathy, unspecified: Secondary | ICD-10-CM | POA: Insufficient documentation

## 2015-02-17 DIAGNOSIS — G609 Hereditary and idiopathic neuropathy, unspecified: Secondary | ICD-10-CM | POA: Insufficient documentation

## 2015-02-17 NOTE — Assessment & Plan Note (Signed)
She has had workup with rheumatology presumably checking B12 and folate and HgA1c per patient normal. Refer to neurology for nerve conduction testing.

## 2015-02-17 NOTE — Progress Notes (Signed)
   Subjective:    Patient ID: Sheila Morales, female    DOB: March 27, 1951, 64 y.o.   MRN: [id number]  HPI The patient is a 64 YO female coming in for neuropathy in her legs. Previously (for years) it was just in her feet. Now is climbing almost to her knees. Has had workup at her rheumatologist recently without etiology. She was recommended to see neurologist and get nerve conduction which she would like to do since it is worsening.   Review of Systems  Constitutional: Negative for fever, activity change, appetite change and fatigue.  HENT: Negative.   Eyes: Negative.   Respiratory: Negative for cough, chest tightness, shortness of breath and wheezing.   Cardiovascular: Negative for chest pain, palpitations and leg swelling.  Gastrointestinal: Negative for abdominal pain, diarrhea, constipation and abdominal distention.  Genitourinary: Negative.   Musculoskeletal: Negative.   Skin: Negative.   Neurological: Positive for numbness. Negative for dizziness, weakness and light-headedness.      Objective:   Physical Exam  Constitutional: She is oriented to person, place, and time. She appears well-developed and well-nourished. No distress.  HENT:  Head: Normocephalic and atraumatic.  Eyes: EOM are normal.  Neck: Normal range of motion.  Cardiovascular: Normal rate and regular rhythm.   Pulmonary/Chest: Effort normal and breath sounds normal. No respiratory distress. She has no wheezes. She has no rales.  Abdominal: Soft. Bowel sounds are normal. She exhibits no distension. There is no tenderness. There is no rebound.  Neurological: She is alert and oriented to person, place, and time. No cranial nerve deficit.  Some decreased tactile sensation in her lower legs. Reflexes normal, no skin color changes.   Skin: Skin is warm and dry.    Filed Vitals:   02/16/15 1332  BP: 158/82  Pulse: 70  Temp: 97.8 F (36.6 C)  TempSrc: Oral  Resp: 16  Height: 5' 2.5" (1.588 m)  Weight: 135 lb (61.236  kg)  SpO2: 98%      Assessment & Plan:

## 2015-02-20 ENCOUNTER — Other Ambulatory Visit: Payer: Medicare Other

## 2015-02-20 ENCOUNTER — Encounter: Payer: Self-pay | Admitting: Neurology

## 2015-02-20 ENCOUNTER — Ambulatory Visit (INDEPENDENT_AMBULATORY_CARE_PROVIDER_SITE_OTHER): Payer: Medicare Other | Admitting: Neurology

## 2015-02-20 VITALS — BP 130/78 | HR 95 | Ht 62.5 in | Wt 134.2 lb

## 2015-02-20 DIAGNOSIS — M329 Systemic lupus erythematosus, unspecified: Secondary | ICD-10-CM | POA: Diagnosis not present

## 2015-02-20 DIAGNOSIS — G629 Polyneuropathy, unspecified: Secondary | ICD-10-CM | POA: Diagnosis not present

## 2015-02-20 DIAGNOSIS — G621 Alcoholic polyneuropathy: Secondary | ICD-10-CM

## 2015-02-20 DIAGNOSIS — Z720 Tobacco use: Secondary | ICD-10-CM | POA: Diagnosis not present

## 2015-02-20 DIAGNOSIS — G63 Polyneuropathy in diseases classified elsewhere: Secondary | ICD-10-CM

## 2015-02-20 DIAGNOSIS — G589 Mononeuropathy, unspecified: Secondary | ICD-10-CM | POA: Diagnosis not present

## 2015-02-20 MED ORDER — GABAPENTIN 300 MG PO CAPS: 300.0000 mg | ORAL_CAPSULE | Freq: Every day | ORAL | Status: DC

## 2015-02-20 NOTE — Progress Notes (Signed)
[facility] Clinic Note - Initial Visit   Date: 02/20/2015  GAVYN ZOSS MRN: [id number] DOB: 11/02/1950   Dear Dr. Sharlet Salina:  Thank you for your kind referral of GENA LASKI for consultation of neuropathy. Although her history is well known to you, please allow [place] to reiterate it for the purpose of our medical record. The patient was accompanied to the clinic by self.    History of Present Illness: ARIEANNA PRESSEY is a 64 y.o. right-handed Caucasian female with GERD, lupus, irritable bowel syndrome, and tobacco use presenting for evaluation of neuropathy.    Starting in 2012, she developing tingling sensation of the feet which was restricted to below the ankles for the first several years.  In 2016, it started progressing up her legs, to the level of the knees.  Tingling is worse at night and keeps her up from sleeping.  She does not paresthesias involving the hands.  Denies any problems with balance or any falls this year.  She has not tried any medication or seen a neurologist previously.   She drinks 2 glasses of wine nightly for the past two year and can drink more on the weekend.  No history of diabetes or thyroid disease.  Her lupus was well controlled for 20+ years, but this year she began have flares and skin lesions.  Out-side paper records, electronic medical record, and images have been reviewed where available and summarized as:  MRI Lumbar spine wwo contrast 12/27/2008: 1. Stable appearance of the marrow in the lumbar spine compared with prior study from 06/03/2008. Findings may reflect a normal variant, although myelofibrosis remains possible. Correlation with complete blood count is recommended. 2. No evidence of pathologic fracture or paraspinal abnormality. 3. No disc herniation, spinal stenosis or nerve root encroachment.  Lab Results  Component Value Date   TSH 1.10 01/24/2015   Lab Results  Component Value Date   ESRSEDRATE 20  01/13/2009    Past Medical History  Diagnosis Date  . Systemic lupus erythematosus ([place])   . H/O: rheumatic fever   . Colitis, ischemic ([place])   . Restless leg syndrome     questionable  . IBS (irritable bowel syndrome)   . Personal history colonic adenoma 03/25/2008    06/2007 right sided adenoma and 2 right hyperplastic polyps    . 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 Arthritis   . External hemorrhoids   . Tubular adenoma of colon     Past Surgical History  Procedure Laterality Date  . Lesion, vulva excision    . Tonsillectomy    . Cholecystectomy, laparoscopic    . Colonoscopy    . Esophagogastroduodenoscopy    . Cholecystectomy       Medications:  Outpatient Encounter Prescriptions as of 02/20/2015  Medication Sig  . cholecalciferol (VITAMIN D) 1000 UNITS tablet Take 1,000 Units by mouth daily.    . colestipol (COLESTID) 5 G granules 1 scoop alternating with 1/2 scoop  . fluticasone (FLONASE) 50 MCG/ACT nasal spray Place 2 sprays into both nostrils daily.  . hydroxychloroquine (PLAQUENIL) 200 MG tablet Take 200 mg by mouth 2 (two) times daily.  . MULTIPLE VITAMIN PO Take 1 tablet by mouth daily.    Marland Kitchen omeprazole (PRILOSEC) 40 MG capsule Take 1 capsule (40 mg total) by mouth daily before breakfast.  . Probiotic Product (PROBIOTIC DAILY PO) Take by mouth.  . vitamin C (ASCORBIC ACID) 500 MG tablet Take 500 mg by mouth  daily.    . zolpidem (AMBIEN) 5 MG tablet TAKE ONE TABLET BY MOUTH ONCE DAILY AT BEDTIME AS NEEDED FOR SLEEP   No facility-administered encounter medications on file as of 02/20/2015.     Allergies: No Known Allergies  Family History: Family History  Problem Relation Age of Onset  . Atrial fibrillation Father   . Stroke Mother   . Colon cancer Father     Social History: Social History  Substance Use Topics  . Smoking status: Current Every Day Smoker -- 1.00 packs/day for 30 years    Types: Cigarettes  . Smokeless tobacco: Never Used      Comment: Declined info  . Alcohol Use: 3.0 oz/week    5 Standard drinks or equivalent per week   Social History   Social History Narrative   HSG. Married '71. 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           Review of Systems:  CONSTITUTIONAL: No fevers, chills, night sweats, or weight loss.   EYES: No visual changes or eye pain ENT: No hearing changes.  No history of nose bleeds.   RESPIRATORY: No cough, wheezing and shortness of breath.   CARDIOVASCULAR: Negative for chest pain, and palpitations.   GI: Negative for abdominal discomfort, blood in stools or black stools.  No recent change in bowel habits.   GU:  No history of incontinence.   MUSCLOSKELETAL: No history of joint pain or swelling.  No myalgias.   SKIN: Negative for lesions, rash, and itching.   HEMATOLOGY/ONCOLOGY: Negative for prolonged bleeding, bruising easily, and swollen nodes.  No history of cancer.   ENDOCRINE: Negative for cold or heat intolerance, polydipsia or goiter.   PSYCH:  No depression or anxiety symptoms.   NEURO: As Above.   Vital Signs:  BP 130/78 mmHg  Pulse 95  Ht 5' 2.5" (1.588 m)  Wt 134 lb 4 oz (60.895 kg)  BMI 24.15 kg/m2  SpO2 93% Pain Scale: 0 on a scale of 0-10   General Medical Exam:   General:  Well appearing, comfortable.   Eyes/ENT: see cranial nerve examination.   Neck: No masses appreciated.  Full range of motion without tenderness.  No carotid bruits. Respiratory:  Clear to auscultation, good air entry bilaterally.   Cardiac:  Regular rate and rhythm, no murmur.   Extremities:  No deformities, edema, or skin discoloration.  Skin:  No rashes or lesions.  Neurological Exam: MENTAL STATUS including orientation to time, place, person, recent and remote memory, attention span and concentration, language, and fund of knowledge is normal.  Speech is not dysarthric.  CRANIAL NERVES: II:   No visual field defects.  Unremarkable fundi.   III-IV-VI: Pupils equal round and reactive to light.  Normal conjugate, extra-ocular eye movements in all directions of gaze.  No nystagmus.  No ptosis.   V:  Normal facial sensation.    VII:  Normal facial symmetry and movements. VIII:  Normal hearing and vestibular function.   IX-X:  Normal palatal movement.   XI:  Normal shoulder shrug and head rotation.   XII:  Normal tongue strength and range of motion, no deviation or fasciculation.  MOTOR:  No atrophy, fasciculations or abnormal movements.  No pronator drift.  Tone is normal.    Right Upper Extremity:    Left Upper Extremity:    Deltoid  5/5   Deltoid  5/5   Biceps  5/5   Biceps  5/5   Triceps  5/5   Triceps  5/5   Wrist extensors  5/5   Wrist extensors  5/5   Wrist flexors  5/5   Wrist flexors  5/5   Finger extensors  5/5   Finger extensors  5/5   Finger flexors  5/5   Finger flexors  5/5   Dorsal interossei  5/5   Dorsal interossei  5/5   Abductor pollicis  5/5   Abductor pollicis  5/5   Tone (Ashworth scale)  0  Tone (Ashworth scale)  0   Right Lower Extremity:    Left Lower Extremity:    Hip flexors  5/5   Hip flexors  5/5   Hip extensors  5/5   Hip extensors  5/5   Knee flexors  5/5   Knee flexors  5/5   Knee extensors  5/5   Knee extensors  5/5   Dorsiflexors  5/5   Dorsiflexors  5/5   Plantarflexors  5/5   Plantarflexors  5/5   Toe extensors  5/5   Toe extensors  5/5   Toe flexors  5/5   Toe flexors  5/5   Tone (Ashworth scale)  0  Tone (Ashworth scale)  0   MSRs:  Right                                                                 Left brachioradialis 2+  brachioradialis 2+  biceps 2+  biceps 2+  triceps 2+  triceps 2+  patellar 2+  patellar 2+  ankle jerk 1+  ankle jerk 1+  Hoffman no  Hoffman no  plantar response down  plantar response down   SENSORY:  Reduced pin prick, temperature, and vibration at the ankles following a gradient pattern.  There is mild  sway with Rhomberg testing.  COORDINATION/GAIT: Normal finger-to- nose-finger and heel-to-shin.  Intact rapid alternating movements bilaterally.  Gait narrow based and stable. Unsteady with heel and tandem gait.  Toe walking intact.   IMPRESSION: Mrs. Massey is a 64 year-old female presenting for evaluation of bilateral lower extremity paresethesias.  Her neurological examination shows a distal predominant  large fiber peripheral neuropathy. I had extensive discussion with the patient regarding the pathogenesis, etiology, management, and natural course of neuropathy. Neuropathy tends to be slowly progressive.  She has several risk factors for neuropathy including lupus and alcohol use.  For completeness, I will check for additional treatable causes of neuropathy.    For her painful paresthesias, start gabapentin 300mg  at bedtime and titrate as needed.   PLAN/RECOMMENDATIONS:  1.  Start gabapentin 300mg  at bedtime 2.  Check vitamin B1, copper, zinc, SPEP/UPEP with IFE 3.  NCS/EMG of the legs 4.  Encouraged to cut back on alcohol and tobacco use  Return to clinic in 3 months.   The duration of this appointment visit was 45 minutes of face-to-face time with the patient.  Greater than 50% of this time was spent in counseling, explanation of diagnosis, planning of further management, and coordination of care.   Thank you for allowing me to participate in patient's care.  If I can answer any additional questions, I would be pleased to do so.    Sincerely,  Seleste Tallman K. Posey Pronto, DO

## 2015-02-20 NOTE — Patient Instructions (Addendum)
1.  Start gabapentin 300mg  at bedtime 2.  Check blood work 3.  NCS/EMG of the legs 4.  Try to cut back on alcohol and smoking  Return to clinic in 3 months

## 2015-02-22 LAB — SPEP & IFE WITH QIG
ALBUMIN ELP: 3.8 g/dL (ref 3.8–4.8)
Alpha-1-Globulin: 0.4 g/dL — ABNORMAL HIGH (ref 0.2–0.3)
Alpha-2-Globulin: 0.9 g/dL (ref 0.5–0.9)
BETA 2: 0.5 g/dL (ref 0.2–0.5)
Beta Globulin: 0.5 g/dL (ref 0.4–0.6)
Gamma Globulin: 1.2 g/dL (ref 0.8–1.7)
IGA: 421 mg/dL — AB (ref 69–380)
IGG (IMMUNOGLOBIN G), SERUM: 1210 mg/dL (ref 690–1700)
IGM, SERUM: 83 mg/dL (ref 52–322)
TOTAL PROTEIN, SERUM ELECTROPHOR: 7.2 g/dL (ref 6.1–8.1)

## 2015-02-23 LAB — UIFE/LIGHT CHAINS/TP QN, 24-HR UR
ALBUMIN, U: DETECTED
ALPHA 1 UR: DETECTED — AB
ALPHA 2 UR: DETECTED — AB
Beta, Urine: DETECTED — AB
GAMMA UR: DETECTED — AB
TOTAL PROTEIN, URINE-UPE24: 21 mg/dL (ref 5–24)

## 2015-02-23 LAB — COPPER, SERUM: COPPER: 94 ug/dL (ref 70–175)

## 2015-02-23 LAB — VITAMIN B1: VITAMIN B1 (THIAMINE): 12 nmol/L (ref 8–30)

## 2015-02-23 LAB — ZINC: Zinc: 59 ug/dL — ABNORMAL LOW (ref 60–130)

## 2015-02-24 ENCOUNTER — Telehealth: Payer: Self-pay | Admitting: Internal Medicine

## 2015-02-24 NOTE — Telephone Encounter (Signed)
Left message for patient to call back  

## 2015-02-24 NOTE — Telephone Encounter (Signed)
Patient reports intermittent abdominal pain.  Pain last two nights has kept her up.  Starts with pain at night then she will have vomiting.  She is not 100 % sure that it is her stomach.  She has a nerve conduction study on Tuesday for some lower back pain and leg pain.  The pain will start on the left side.  Doing well on colestid and diarrhea.  She wonders if you have any thoughts.

## 2015-02-26 NOTE — Telephone Encounter (Signed)
Did it start after the gabapentin was started? Recently started that after neuro visit

## 2015-02-27 NOTE — Telephone Encounter (Signed)
OK let's wait for nerve conduction study

## 2015-02-27 NOTE — Telephone Encounter (Signed)
I spoke with the patient.  She has not been taking gabapentin.  She states she is a little better this am.  She wants to discuss the possibility of additional testing once she has completed the neuro testing. She will call back once she is done.  "my biggest complaint is nothing tastes good and I just can't eat it"

## 2015-03-07 ENCOUNTER — Ambulatory Visit (INDEPENDENT_AMBULATORY_CARE_PROVIDER_SITE_OTHER): Payer: Medicare Other | Admitting: Neurology

## 2015-03-07 DIAGNOSIS — G629 Polyneuropathy, unspecified: Secondary | ICD-10-CM | POA: Diagnosis not present

## 2015-03-07 DIAGNOSIS — M329 Systemic lupus erythematosus, unspecified: Secondary | ICD-10-CM | POA: Diagnosis not present

## 2015-03-07 DIAGNOSIS — G621 Alcoholic polyneuropathy: Secondary | ICD-10-CM

## 2015-03-07 DIAGNOSIS — Z720 Tobacco use: Secondary | ICD-10-CM

## 2015-03-07 DIAGNOSIS — G63 Polyneuropathy in diseases classified elsewhere: Secondary | ICD-10-CM

## 2015-03-07 NOTE — Procedures (Signed)
[facility]  [address], [address]  [place], [place] [address] Tel: [phone] Fax:  [phone] Test Date:  03/07/2015  Patient: Sheila Morales DOB: [birth year] Physician: Narda Amber, DO  Sex: Female Height: 5\' 2"  Ref Phys: Narda Amber, DO  ID#: [id number] Temp: 32.6C Technician: Jerilynn Mages. Dean   Patient Complaints: This is a 64 year old female referred for evaluation of low back pain and bilateral leg paresthesias.  NCV & EMG Findings: Extensive electrodiagnostic testing of the left lower extremity and additional studies of the right shows: 1. Bilateral sural and superficial peroneal sensory responses are within normal limits. 2. Bilateral tibial and peroneal motor responses are within normal limits. 3. Left tibial H reflex studies within normal limits. 4. There is no evidence of active or chronic motor axon loss changes affecting any of the tested muscles. Motor unit configuration and recruitment pattern is within normal limits.  Impression: This is a normal study. In particular, there is no evidence of a generalized sensorimotor polyneuropathy or left lumbosacral radiculopathy.   ___________________________ Narda Amber, DO    Nerve Conduction Studies Anti Sensory Summary Table   Site NR Peak (ms) Norm Peak (ms) P-T Amp (V) Norm P-T Amp  Left Sup Peroneal Anti Sensory (Ant Lat Mall)  32.6C  12 cm    3.3 <4.6 17.9 >3  Right Sup Peroneal Anti Sensory (Ant Lat Mall)  32.6C  12 cm    3.4 <4.6 13.6 >3  Left Sural Anti Sensory (Lat Mall)  32.6C  Calf    3.7 <4.6 8.1 >3  Right Sural Anti Sensory (Lat Mall)  32.6C  Calf    4.3 <4.6 10.0 >3   Motor Summary Table   Site NR Onset (ms) Norm Onset (ms) O-P Amp (mV) Norm O-P Amp Site1 Site2 Delta-0 (ms) Dist (cm) Vel (m/s) Norm Vel (m/s)  Left Peroneal Motor (Ext Dig Brev)  32.6C  Ankle    3.1 <6.0 6.9 >2.5 B Fib Ankle 6.8 32.0 47 >40  B Fib    9.9  6.1  Poplt B Fib 2.0 10.0 50 >40  Poplt    11.9  5.8           Right Peroneal Motor (Ext Dig Brev)  Ankle    3.1 <6.0 4.9 >2.5 B Fib Ankle 6.8 32.0 47 >40  B Fib    9.9  4.3  Poplt B Fib 1.7 10.0 59 >40  Poplt    11.6  4.1         Left Tibial Motor (Abd Hall Brev)  32.6C  Ankle    3.3 <6.0 11.3 >4 Knee Ankle 8.7 37.5 43 >40  Knee    12.0  6.7         Right Tibial Motor (Abd Hall Brev)  Ankle    3.4 <6.0 13.6 >4 Knee Ankle 8.9 39.0 44 >40  Knee    12.3  8.7          H Reflex Studies   NR H-Lat (ms) Lat Norm (ms) L-R H-Lat (ms)  Left Tibial (Gastroc)  32.6C     32.93 <35 0.00   EMG   Side Muscle Ins Act Fibs Psw Fasc Number Recrt Dur Dur. Amp Amp. Poly Poly. Comment  Left RectFemoris Nml Nml Nml Nml Nml Nml Nml Nml Nml Nml Nml Nml N/A  Left Gastroc Nml Nml Nml Nml Nml Nml Nml Nml Nml Nml Nml Nml N/A  Left Flex Dig Long Nml Nml Nml Nml Nml Nml Nml  Nml Nml Nml Nml Nml N/A  Left GluteusMed Nml Nml Nml Nml Nml Nml Nml Nml Nml Nml Nml Nml N/A  Left BicepsFemS Nml Nml Nml Nml Nml Nml Nml Nml Nml Nml Nml Nml N/A  Left AntTibialis Nml Nml Nml Nml Nml Nml Nml Nml Nml Nml Nml Nml N/A      Waveforms:

## 2015-03-08 ENCOUNTER — Other Ambulatory Visit: Payer: Self-pay | Admitting: *Deleted

## 2015-03-08 ENCOUNTER — Telehealth: Payer: Self-pay | Admitting: Neurology

## 2015-03-08 DIAGNOSIS — G621 Alcoholic polyneuropathy: Secondary | ICD-10-CM

## 2015-03-08 DIAGNOSIS — M329 Systemic lupus erythematosus, unspecified: Principal | ICD-10-CM

## 2015-03-08 DIAGNOSIS — G63 Polyneuropathy in diseases classified elsewhere: Secondary | ICD-10-CM

## 2015-03-08 NOTE — Telephone Encounter (Signed)
Pt called for [phone]

## 2015-03-08 NOTE — Telephone Encounter (Signed)
See next note

## 2015-03-13 ENCOUNTER — Telehealth: Payer: Self-pay | Admitting: *Deleted

## 2015-03-13 MED ORDER — ZOLPIDEM TARTRATE 5 MG PO TABS: 5.0000 mg | ORAL_TABLET | Freq: Every evening | ORAL | Status: DC | PRN

## 2015-03-13 NOTE — Telephone Encounter (Signed)
Left msg on triage stating needing a refill on her ambien. Pharmacy has been change [facility]...[name]

## 2015-03-13 NOTE — Telephone Encounter (Signed)
Printed and signed.  

## 2015-03-14 NOTE — Telephone Encounter (Signed)
Notified pt rx sent to [facility]../lmb 

## 2015-03-18 ENCOUNTER — Ambulatory Visit
Admission: RE | Admit: 2015-03-18 | Discharge: 2015-03-18 | Disposition: A | Discharge status: Home / Self Care | Payer: Medicare Other | Source: Ambulatory Visit | Attending: Neurology | Admitting: Neurology

## 2015-03-18 DIAGNOSIS — G621 Alcoholic polyneuropathy: Secondary | ICD-10-CM

## 2015-03-18 DIAGNOSIS — M5126 Other intervertebral disc displacement, lumbar region: Secondary | ICD-10-CM | POA: Diagnosis not present

## 2015-03-18 DIAGNOSIS — G63 Polyneuropathy in diseases classified elsewhere: Secondary | ICD-10-CM

## 2015-03-18 DIAGNOSIS — M329 Systemic lupus erythematosus, unspecified: Principal | ICD-10-CM

## 2015-03-20 ENCOUNTER — Telehealth: Payer: Self-pay | Admitting: Neurology

## 2015-03-20 DIAGNOSIS — M5032 Other cervical disc degeneration, mid-cervical region, unspecified level: Secondary | ICD-10-CM | POA: Diagnosis not present

## 2015-03-20 DIAGNOSIS — M9903 Segmental and somatic dysfunction of lumbar region: Secondary | ICD-10-CM | POA: Diagnosis not present

## 2015-03-20 DIAGNOSIS — M5134 Other intervertebral disc degeneration, thoracic region: Secondary | ICD-10-CM | POA: Diagnosis not present

## 2015-03-20 DIAGNOSIS — M9902 Segmental and somatic dysfunction of thoracic region: Secondary | ICD-10-CM | POA: Diagnosis not present

## 2015-03-20 DIAGNOSIS — M5136 Other intervertebral disc degeneration, lumbar region: Secondary | ICD-10-CM | POA: Diagnosis not present

## 2015-03-20 DIAGNOSIS — M9901 Segmental and somatic dysfunction of cervical region: Secondary | ICD-10-CM | POA: Diagnosis not present

## 2015-03-20 NOTE — Telephone Encounter (Signed)
Pt states pharmacy sent in something (probably a prior auth). Can check on this. She has been waiting over a week for this

## 2015-03-20 NOTE — Telephone Encounter (Signed)
Please advise 

## 2015-03-20 NOTE — Telephone Encounter (Signed)
Patient notified

## 2015-03-20 NOTE — Telephone Encounter (Signed)
Pt called for recent MRI results/ return call @ [phone]

## 2015-03-20 NOTE — Telephone Encounter (Signed)
Left message for patient to call me back. 

## 2015-03-20 NOTE — Telephone Encounter (Signed)
MRI lumbar spine shows mild age-related changes without any evidence of nerve impingement and it is essentially stable when compared to her previous MRI 2010.  How is her pain doing? if it is still not controlled, increase gabapentin to 300mg  three times daily - please send new Rx, if needed.  Lyvonne Cassell K. Posey Pronto, DO

## 2015-03-21 DIAGNOSIS — M9902 Segmental and somatic dysfunction of thoracic region: Secondary | ICD-10-CM | POA: Diagnosis not present

## 2015-03-21 DIAGNOSIS — M9901 Segmental and somatic dysfunction of cervical region: Secondary | ICD-10-CM | POA: Diagnosis not present

## 2015-03-21 DIAGNOSIS — M9903 Segmental and somatic dysfunction of lumbar region: Secondary | ICD-10-CM | POA: Diagnosis not present

## 2015-03-21 DIAGNOSIS — M5134 Other intervertebral disc degeneration, thoracic region: Secondary | ICD-10-CM | POA: Diagnosis not present

## 2015-03-21 DIAGNOSIS — M5032 Other cervical disc degeneration, mid-cervical region, unspecified level: Secondary | ICD-10-CM | POA: Diagnosis not present

## 2015-03-21 DIAGNOSIS — M5136 Other intervertebral disc degeneration, lumbar region: Secondary | ICD-10-CM | POA: Diagnosis not present

## 2015-03-23 DIAGNOSIS — M9901 Segmental and somatic dysfunction of cervical region: Secondary | ICD-10-CM | POA: Diagnosis not present

## 2015-03-23 DIAGNOSIS — M5134 Other intervertebral disc degeneration, thoracic region: Secondary | ICD-10-CM | POA: Diagnosis not present

## 2015-03-23 DIAGNOSIS — M5136 Other intervertebral disc degeneration, lumbar region: Secondary | ICD-10-CM | POA: Diagnosis not present

## 2015-03-23 DIAGNOSIS — M9903 Segmental and somatic dysfunction of lumbar region: Secondary | ICD-10-CM | POA: Diagnosis not present

## 2015-03-23 DIAGNOSIS — M5032 Other cervical disc degeneration, mid-cervical region, unspecified level: Secondary | ICD-10-CM | POA: Diagnosis not present

## 2015-03-23 DIAGNOSIS — M9902 Segmental and somatic dysfunction of thoracic region: Secondary | ICD-10-CM | POA: Diagnosis not present

## 2015-03-24 DIAGNOSIS — M5134 Other intervertebral disc degeneration, thoracic region: Secondary | ICD-10-CM | POA: Diagnosis not present

## 2015-03-24 DIAGNOSIS — M5032 Other cervical disc degeneration, mid-cervical region, unspecified level: Secondary | ICD-10-CM | POA: Diagnosis not present

## 2015-03-24 DIAGNOSIS — M9903 Segmental and somatic dysfunction of lumbar region: Secondary | ICD-10-CM | POA: Diagnosis not present

## 2015-03-24 DIAGNOSIS — M9901 Segmental and somatic dysfunction of cervical region: Secondary | ICD-10-CM | POA: Diagnosis not present

## 2015-03-24 DIAGNOSIS — M5136 Other intervertebral disc degeneration, lumbar region: Secondary | ICD-10-CM | POA: Diagnosis not present

## 2015-03-24 DIAGNOSIS — M9902 Segmental and somatic dysfunction of thoracic region: Secondary | ICD-10-CM | POA: Diagnosis not present

## 2015-03-24 NOTE — Telephone Encounter (Signed)
Called patients pharmacy and they stated that patient picked medication up on Tuesday November 29th

## 2015-03-27 DIAGNOSIS — M9903 Segmental and somatic dysfunction of lumbar region: Secondary | ICD-10-CM | POA: Diagnosis not present

## 2015-03-27 DIAGNOSIS — M5136 Other intervertebral disc degeneration, lumbar region: Secondary | ICD-10-CM | POA: Diagnosis not present

## 2015-03-27 DIAGNOSIS — M9902 Segmental and somatic dysfunction of thoracic region: Secondary | ICD-10-CM | POA: Diagnosis not present

## 2015-03-27 DIAGNOSIS — M9901 Segmental and somatic dysfunction of cervical region: Secondary | ICD-10-CM | POA: Diagnosis not present

## 2015-03-27 DIAGNOSIS — M5134 Other intervertebral disc degeneration, thoracic region: Secondary | ICD-10-CM | POA: Diagnosis not present

## 2015-03-27 DIAGNOSIS — M5032 Other cervical disc degeneration, mid-cervical region, unspecified level: Secondary | ICD-10-CM | POA: Diagnosis not present

## 2015-03-28 DIAGNOSIS — L720 Epidermal cyst: Secondary | ICD-10-CM | POA: Diagnosis not present

## 2015-03-28 DIAGNOSIS — L821 Other seborrheic keratosis: Secondary | ICD-10-CM | POA: Diagnosis not present

## 2015-03-28 DIAGNOSIS — L820 Inflamed seborrheic keratosis: Secondary | ICD-10-CM | POA: Diagnosis not present

## 2015-03-28 DIAGNOSIS — D225 Melanocytic nevi of trunk: Secondary | ICD-10-CM | POA: Diagnosis not present

## 2015-03-28 DIAGNOSIS — L930 Discoid lupus erythematosus: Secondary | ICD-10-CM | POA: Diagnosis not present

## 2015-03-29 DIAGNOSIS — M9902 Segmental and somatic dysfunction of thoracic region: Secondary | ICD-10-CM | POA: Diagnosis not present

## 2015-03-29 DIAGNOSIS — M5134 Other intervertebral disc degeneration, thoracic region: Secondary | ICD-10-CM | POA: Diagnosis not present

## 2015-03-29 DIAGNOSIS — M5136 Other intervertebral disc degeneration, lumbar region: Secondary | ICD-10-CM | POA: Diagnosis not present

## 2015-03-29 DIAGNOSIS — M9903 Segmental and somatic dysfunction of lumbar region: Secondary | ICD-10-CM | POA: Diagnosis not present

## 2015-03-29 DIAGNOSIS — M5032 Other cervical disc degeneration, mid-cervical region, unspecified level: Secondary | ICD-10-CM | POA: Diagnosis not present

## 2015-03-29 DIAGNOSIS — M9901 Segmental and somatic dysfunction of cervical region: Secondary | ICD-10-CM | POA: Diagnosis not present

## 2015-03-30 DIAGNOSIS — M9902 Segmental and somatic dysfunction of thoracic region: Secondary | ICD-10-CM | POA: Diagnosis not present

## 2015-03-30 DIAGNOSIS — M5134 Other intervertebral disc degeneration, thoracic region: Secondary | ICD-10-CM | POA: Diagnosis not present

## 2015-03-30 DIAGNOSIS — M5136 Other intervertebral disc degeneration, lumbar region: Secondary | ICD-10-CM | POA: Diagnosis not present

## 2015-03-30 DIAGNOSIS — M9903 Segmental and somatic dysfunction of lumbar region: Secondary | ICD-10-CM | POA: Diagnosis not present

## 2015-03-30 DIAGNOSIS — M9901 Segmental and somatic dysfunction of cervical region: Secondary | ICD-10-CM | POA: Diagnosis not present

## 2015-03-30 DIAGNOSIS — M5032 Other cervical disc degeneration, mid-cervical region, unspecified level: Secondary | ICD-10-CM | POA: Diagnosis not present

## 2015-04-03 DIAGNOSIS — M5136 Other intervertebral disc degeneration, lumbar region: Secondary | ICD-10-CM | POA: Diagnosis not present

## 2015-04-03 DIAGNOSIS — M9903 Segmental and somatic dysfunction of lumbar region: Secondary | ICD-10-CM | POA: Diagnosis not present

## 2015-04-03 DIAGNOSIS — M5134 Other intervertebral disc degeneration, thoracic region: Secondary | ICD-10-CM | POA: Diagnosis not present

## 2015-04-03 DIAGNOSIS — M9902 Segmental and somatic dysfunction of thoracic region: Secondary | ICD-10-CM | POA: Diagnosis not present

## 2015-04-03 DIAGNOSIS — M5032 Other cervical disc degeneration, mid-cervical region, unspecified level: Secondary | ICD-10-CM | POA: Diagnosis not present

## 2015-04-03 DIAGNOSIS — M9901 Segmental and somatic dysfunction of cervical region: Secondary | ICD-10-CM | POA: Diagnosis not present

## 2015-04-04 DIAGNOSIS — M5134 Other intervertebral disc degeneration, thoracic region: Secondary | ICD-10-CM | POA: Diagnosis not present

## 2015-04-04 DIAGNOSIS — M5136 Other intervertebral disc degeneration, lumbar region: Secondary | ICD-10-CM | POA: Diagnosis not present

## 2015-04-04 DIAGNOSIS — M9901 Segmental and somatic dysfunction of cervical region: Secondary | ICD-10-CM | POA: Diagnosis not present

## 2015-04-04 DIAGNOSIS — M9902 Segmental and somatic dysfunction of thoracic region: Secondary | ICD-10-CM | POA: Diagnosis not present

## 2015-04-04 DIAGNOSIS — M9903 Segmental and somatic dysfunction of lumbar region: Secondary | ICD-10-CM | POA: Diagnosis not present

## 2015-04-04 DIAGNOSIS — M5032 Other cervical disc degeneration, mid-cervical region, unspecified level: Secondary | ICD-10-CM | POA: Diagnosis not present

## 2015-04-05 DIAGNOSIS — M9902 Segmental and somatic dysfunction of thoracic region: Secondary | ICD-10-CM | POA: Diagnosis not present

## 2015-04-05 DIAGNOSIS — M9903 Segmental and somatic dysfunction of lumbar region: Secondary | ICD-10-CM | POA: Diagnosis not present

## 2015-04-05 DIAGNOSIS — M9901 Segmental and somatic dysfunction of cervical region: Secondary | ICD-10-CM | POA: Diagnosis not present

## 2015-04-05 DIAGNOSIS — M5032 Other cervical disc degeneration, mid-cervical region, unspecified level: Secondary | ICD-10-CM | POA: Diagnosis not present

## 2015-04-05 DIAGNOSIS — M5134 Other intervertebral disc degeneration, thoracic region: Secondary | ICD-10-CM | POA: Diagnosis not present

## 2015-04-05 DIAGNOSIS — M5136 Other intervertebral disc degeneration, lumbar region: Secondary | ICD-10-CM | POA: Diagnosis not present

## 2015-04-10 DIAGNOSIS — M5032 Other cervical disc degeneration, mid-cervical region, unspecified level: Secondary | ICD-10-CM | POA: Diagnosis not present

## 2015-04-10 DIAGNOSIS — M9901 Segmental and somatic dysfunction of cervical region: Secondary | ICD-10-CM | POA: Diagnosis not present

## 2015-04-10 DIAGNOSIS — M9902 Segmental and somatic dysfunction of thoracic region: Secondary | ICD-10-CM | POA: Diagnosis not present

## 2015-04-10 DIAGNOSIS — M9903 Segmental and somatic dysfunction of lumbar region: Secondary | ICD-10-CM | POA: Diagnosis not present

## 2015-04-10 DIAGNOSIS — M5136 Other intervertebral disc degeneration, lumbar region: Secondary | ICD-10-CM | POA: Diagnosis not present

## 2015-04-10 DIAGNOSIS — M5134 Other intervertebral disc degeneration, thoracic region: Secondary | ICD-10-CM | POA: Diagnosis not present

## 2015-04-11 DIAGNOSIS — M9903 Segmental and somatic dysfunction of lumbar region: Secondary | ICD-10-CM | POA: Diagnosis not present

## 2015-04-11 DIAGNOSIS — M5136 Other intervertebral disc degeneration, lumbar region: Secondary | ICD-10-CM | POA: Diagnosis not present

## 2015-04-11 DIAGNOSIS — M5032 Other cervical disc degeneration, mid-cervical region, unspecified level: Secondary | ICD-10-CM | POA: Diagnosis not present

## 2015-04-11 DIAGNOSIS — M5134 Other intervertebral disc degeneration, thoracic region: Secondary | ICD-10-CM | POA: Diagnosis not present

## 2015-04-11 DIAGNOSIS — M9901 Segmental and somatic dysfunction of cervical region: Secondary | ICD-10-CM | POA: Diagnosis not present

## 2015-04-11 DIAGNOSIS — M9902 Segmental and somatic dysfunction of thoracic region: Secondary | ICD-10-CM | POA: Diagnosis not present

## 2015-04-12 DIAGNOSIS — M9902 Segmental and somatic dysfunction of thoracic region: Secondary | ICD-10-CM | POA: Diagnosis not present

## 2015-04-12 DIAGNOSIS — M5032 Other cervical disc degeneration, mid-cervical region, unspecified level: Secondary | ICD-10-CM | POA: Diagnosis not present

## 2015-04-12 DIAGNOSIS — M9903 Segmental and somatic dysfunction of lumbar region: Secondary | ICD-10-CM | POA: Diagnosis not present

## 2015-04-12 DIAGNOSIS — M5134 Other intervertebral disc degeneration, thoracic region: Secondary | ICD-10-CM | POA: Diagnosis not present

## 2015-04-12 DIAGNOSIS — M5136 Other intervertebral disc degeneration, lumbar region: Secondary | ICD-10-CM | POA: Diagnosis not present

## 2015-04-12 DIAGNOSIS — M9901 Segmental and somatic dysfunction of cervical region: Secondary | ICD-10-CM | POA: Diagnosis not present

## 2015-04-18 DIAGNOSIS — M9902 Segmental and somatic dysfunction of thoracic region: Secondary | ICD-10-CM | POA: Diagnosis not present

## 2015-04-18 DIAGNOSIS — M9901 Segmental and somatic dysfunction of cervical region: Secondary | ICD-10-CM | POA: Diagnosis not present

## 2015-04-18 DIAGNOSIS — M5134 Other intervertebral disc degeneration, thoracic region: Secondary | ICD-10-CM | POA: Diagnosis not present

## 2015-04-18 DIAGNOSIS — M5136 Other intervertebral disc degeneration, lumbar region: Secondary | ICD-10-CM | POA: Diagnosis not present

## 2015-04-18 DIAGNOSIS — M5032 Other cervical disc degeneration, mid-cervical region, unspecified level: Secondary | ICD-10-CM | POA: Diagnosis not present

## 2015-04-18 DIAGNOSIS — M9903 Segmental and somatic dysfunction of lumbar region: Secondary | ICD-10-CM | POA: Diagnosis not present

## 2015-04-19 DIAGNOSIS — M5134 Other intervertebral disc degeneration, thoracic region: Secondary | ICD-10-CM | POA: Diagnosis not present

## 2015-04-19 DIAGNOSIS — M9901 Segmental and somatic dysfunction of cervical region: Secondary | ICD-10-CM | POA: Diagnosis not present

## 2015-04-19 DIAGNOSIS — M5032 Other cervical disc degeneration, mid-cervical region, unspecified level: Secondary | ICD-10-CM | POA: Diagnosis not present

## 2015-04-19 DIAGNOSIS — M9902 Segmental and somatic dysfunction of thoracic region: Secondary | ICD-10-CM | POA: Diagnosis not present

## 2015-04-19 DIAGNOSIS — M5136 Other intervertebral disc degeneration, lumbar region: Secondary | ICD-10-CM | POA: Diagnosis not present

## 2015-04-19 DIAGNOSIS — M9903 Segmental and somatic dysfunction of lumbar region: Secondary | ICD-10-CM | POA: Diagnosis not present

## 2015-04-25 DIAGNOSIS — M9902 Segmental and somatic dysfunction of thoracic region: Secondary | ICD-10-CM | POA: Diagnosis not present

## 2015-04-25 DIAGNOSIS — M9901 Segmental and somatic dysfunction of cervical region: Secondary | ICD-10-CM | POA: Diagnosis not present

## 2015-04-25 DIAGNOSIS — M5136 Other intervertebral disc degeneration, lumbar region: Secondary | ICD-10-CM | POA: Diagnosis not present

## 2015-04-25 DIAGNOSIS — M9903 Segmental and somatic dysfunction of lumbar region: Secondary | ICD-10-CM | POA: Diagnosis not present

## 2015-04-25 DIAGNOSIS — M5134 Other intervertebral disc degeneration, thoracic region: Secondary | ICD-10-CM | POA: Diagnosis not present

## 2015-04-25 DIAGNOSIS — M5032 Other cervical disc degeneration, mid-cervical region, unspecified level: Secondary | ICD-10-CM | POA: Diagnosis not present

## 2015-04-26 DIAGNOSIS — M5032 Other cervical disc degeneration, mid-cervical region, unspecified level: Secondary | ICD-10-CM | POA: Diagnosis not present

## 2015-04-26 DIAGNOSIS — M5134 Other intervertebral disc degeneration, thoracic region: Secondary | ICD-10-CM | POA: Diagnosis not present

## 2015-04-26 DIAGNOSIS — M9902 Segmental and somatic dysfunction of thoracic region: Secondary | ICD-10-CM | POA: Diagnosis not present

## 2015-04-26 DIAGNOSIS — M9903 Segmental and somatic dysfunction of lumbar region: Secondary | ICD-10-CM | POA: Diagnosis not present

## 2015-04-26 DIAGNOSIS — M5136 Other intervertebral disc degeneration, lumbar region: Secondary | ICD-10-CM | POA: Diagnosis not present

## 2015-04-26 DIAGNOSIS — M9901 Segmental and somatic dysfunction of cervical region: Secondary | ICD-10-CM | POA: Diagnosis not present

## 2015-05-10 DIAGNOSIS — M5136 Other intervertebral disc degeneration, lumbar region: Secondary | ICD-10-CM | POA: Diagnosis not present

## 2015-05-10 DIAGNOSIS — M9901 Segmental and somatic dysfunction of cervical region: Secondary | ICD-10-CM | POA: Diagnosis not present

## 2015-05-10 DIAGNOSIS — M9903 Segmental and somatic dysfunction of lumbar region: Secondary | ICD-10-CM | POA: Diagnosis not present

## 2015-05-10 DIAGNOSIS — M5032 Other cervical disc degeneration, mid-cervical region, unspecified level: Secondary | ICD-10-CM | POA: Diagnosis not present

## 2015-05-10 DIAGNOSIS — M5134 Other intervertebral disc degeneration, thoracic region: Secondary | ICD-10-CM | POA: Diagnosis not present

## 2015-05-10 DIAGNOSIS — M9902 Segmental and somatic dysfunction of thoracic region: Secondary | ICD-10-CM | POA: Diagnosis not present

## 2015-05-15 ENCOUNTER — Telehealth: Payer: Self-pay | Admitting: Neurology

## 2015-05-15 DIAGNOSIS — M9902 Segmental and somatic dysfunction of thoracic region: Secondary | ICD-10-CM | POA: Diagnosis not present

## 2015-05-15 DIAGNOSIS — L932 Other local lupus erythematosus: Secondary | ICD-10-CM | POA: Diagnosis not present

## 2015-05-15 DIAGNOSIS — M9903 Segmental and somatic dysfunction of lumbar region: Secondary | ICD-10-CM | POA: Diagnosis not present

## 2015-05-15 DIAGNOSIS — R202 Paresthesia of skin: Secondary | ICD-10-CM | POA: Diagnosis not present

## 2015-05-15 DIAGNOSIS — M9901 Segmental and somatic dysfunction of cervical region: Secondary | ICD-10-CM | POA: Diagnosis not present

## 2015-05-15 DIAGNOSIS — M5134 Other intervertebral disc degeneration, thoracic region: Secondary | ICD-10-CM | POA: Diagnosis not present

## 2015-05-15 DIAGNOSIS — M328 Other forms of systemic lupus erythematosus: Secondary | ICD-10-CM | POA: Diagnosis not present

## 2015-05-15 DIAGNOSIS — M5136 Other intervertebral disc degeneration, lumbar region: Secondary | ICD-10-CM | POA: Diagnosis not present

## 2015-05-15 DIAGNOSIS — M5032 Other cervical disc degeneration, mid-cervical region, unspecified level: Secondary | ICD-10-CM | POA: Diagnosis not present

## 2015-05-15 NOTE — Telephone Encounter (Signed)
Gabapentin was started back in October.

## 2015-05-15 NOTE — Telephone Encounter (Signed)
Pt wants to have [place] give her a stronger dose of the medication she is taking for the pain in her leg she did not leave the name of the medication on the voicemail pt phone number is [phone]

## 2015-05-15 NOTE — Telephone Encounter (Signed)
What does is she currently taking?  If she is taking gabapentin 300mg  TID then increase to gabapentin to 600mg  twice daily.  She will need new Rx.

## 2015-05-16 NOTE — Telephone Encounter (Signed)
Patient is only taking 300 mg bid so I informed her that she can try 300 mg tid per Dr. Posey Pronto.  She is coming back in Feb and will update [place] then on how that works for her.

## 2015-05-23 DIAGNOSIS — M9902 Segmental and somatic dysfunction of thoracic region: Secondary | ICD-10-CM | POA: Diagnosis not present

## 2015-05-23 DIAGNOSIS — M5032 Other cervical disc degeneration, mid-cervical region, unspecified level: Secondary | ICD-10-CM | POA: Diagnosis not present

## 2015-05-23 DIAGNOSIS — M5134 Other intervertebral disc degeneration, thoracic region: Secondary | ICD-10-CM | POA: Diagnosis not present

## 2015-05-23 DIAGNOSIS — M9903 Segmental and somatic dysfunction of lumbar region: Secondary | ICD-10-CM | POA: Diagnosis not present

## 2015-05-23 DIAGNOSIS — M9901 Segmental and somatic dysfunction of cervical region: Secondary | ICD-10-CM | POA: Diagnosis not present

## 2015-05-23 DIAGNOSIS — M5136 Other intervertebral disc degeneration, lumbar region: Secondary | ICD-10-CM | POA: Diagnosis not present

## 2015-05-30 DIAGNOSIS — M5136 Other intervertebral disc degeneration, lumbar region: Secondary | ICD-10-CM | POA: Diagnosis not present

## 2015-05-30 DIAGNOSIS — M5134 Other intervertebral disc degeneration, thoracic region: Secondary | ICD-10-CM | POA: Diagnosis not present

## 2015-05-30 DIAGNOSIS — M9901 Segmental and somatic dysfunction of cervical region: Secondary | ICD-10-CM | POA: Diagnosis not present

## 2015-05-30 DIAGNOSIS — M5032 Other cervical disc degeneration, mid-cervical region, unspecified level: Secondary | ICD-10-CM | POA: Diagnosis not present

## 2015-05-30 DIAGNOSIS — M9902 Segmental and somatic dysfunction of thoracic region: Secondary | ICD-10-CM | POA: Diagnosis not present

## 2015-05-30 DIAGNOSIS — M9903 Segmental and somatic dysfunction of lumbar region: Secondary | ICD-10-CM | POA: Diagnosis not present

## 2015-05-31 ENCOUNTER — Telehealth: Payer: Self-pay | Admitting: Neurology

## 2015-05-31 ENCOUNTER — Other Ambulatory Visit: Payer: Self-pay | Admitting: *Deleted

## 2015-05-31 MED ORDER — GABAPENTIN 300 MG PO CAPS: 300.0000 mg | ORAL_CAPSULE | Freq: Three times a day (TID) | ORAL | Status: DC

## 2015-05-31 NOTE — Telephone Encounter (Signed)
VM-PT left a message to have a call back/Dawn CB# [phone]

## 2015-05-31 NOTE — Telephone Encounter (Signed)
Noted and agree. 

## 2015-05-31 NOTE — Telephone Encounter (Signed)
FYI.  Patient called and said that she has been out of gabapentin for 2 days and her legs are hurting.  She tried to get it refilled early but has not heard from the pharmacy.  She ran out early since we have increased the dose from 1 capsule at bedtime to 1 capsule tid.  Rx sent in for #90 with 5 refills.

## 2015-06-06 DIAGNOSIS — M5134 Other intervertebral disc degeneration, thoracic region: Secondary | ICD-10-CM | POA: Diagnosis not present

## 2015-06-06 DIAGNOSIS — M9902 Segmental and somatic dysfunction of thoracic region: Secondary | ICD-10-CM | POA: Diagnosis not present

## 2015-06-06 DIAGNOSIS — M9903 Segmental and somatic dysfunction of lumbar region: Secondary | ICD-10-CM | POA: Diagnosis not present

## 2015-06-06 DIAGNOSIS — M9901 Segmental and somatic dysfunction of cervical region: Secondary | ICD-10-CM | POA: Diagnosis not present

## 2015-06-06 DIAGNOSIS — M5032 Other cervical disc degeneration, mid-cervical region, unspecified level: Secondary | ICD-10-CM | POA: Diagnosis not present

## 2015-06-06 DIAGNOSIS — M5136 Other intervertebral disc degeneration, lumbar region: Secondary | ICD-10-CM | POA: Diagnosis not present

## 2015-06-12 ENCOUNTER — Ambulatory Visit (INDEPENDENT_AMBULATORY_CARE_PROVIDER_SITE_OTHER): Payer: Medicare Other | Admitting: Neurology

## 2015-06-12 ENCOUNTER — Encounter: Payer: Self-pay | Admitting: Neurology

## 2015-06-12 VITALS — BP 110/70 | HR 105 | Wt 134.4 lb

## 2015-06-12 DIAGNOSIS — G621 Alcoholic polyneuropathy: Secondary | ICD-10-CM

## 2015-06-12 DIAGNOSIS — Z720 Tobacco use: Secondary | ICD-10-CM

## 2015-06-12 DIAGNOSIS — M329 Systemic lupus erythematosus, unspecified: Secondary | ICD-10-CM

## 2015-06-12 DIAGNOSIS — G63 Polyneuropathy in diseases classified elsewhere: Secondary | ICD-10-CM

## 2015-06-12 DIAGNOSIS — G629 Polyneuropathy, unspecified: Secondary | ICD-10-CM

## 2015-06-12 NOTE — Progress Notes (Signed)
Follow-up Visit   Date: 06/12/2015    Sheila Morales MRN: [id number] DOB: 03-18-1951   Interim History: Sheila Morales is a 65 y.o. right-handed Caucasian female with GERD, lupus, irritable bowel syndrome, and tobacco use returning to the clinic for follow-up of painful paresthesias of the feet.  The patient was accompanied to the clinic by self.  History of present illness: Starting in 2012, she developing tingling sensation of the feet which was restricted to below the ankles for the first several years. In 2016, it started progressing up her legs, to the level of the knees. Tingling is worse at night and keeps her up from sleeping. She does not paresthesias involving the hands. Denies any problems with balance or any falls this year. She has not tried any medication or seen a neurologist previously.   She drinks 2 glasses of wine nightly for the past two year and can drink more on the weekend. No history of diabetes or thyroid disease. Her lupus was well controlled for 20+ years, but this year she began have flares and skin lesions.  UPDATE 06/11/2014:  She is doing well on gabapentin 300mg  three times daily and reports 90% reduction in painful paresthesias.  No new complaints.  Medications:  Current Outpatient Prescriptions on File Prior to Visit  Medication Sig Dispense Refill  . cholecalciferol (VITAMIN D) 1000 UNITS tablet Take 1,000 Units by mouth daily.      . colestipol (COLESTID) 5 G granules 1 scoop alternating with 1/2 scoop 500 g 12  . fluticasone (FLONASE) 50 MCG/ACT nasal spray Place 2 sprays into both nostrils daily. 16 g 6  . gabapentin (NEURONTIN) 300 MG capsule Take 1 capsule (300 mg total) by mouth 3 (three) times daily. 90 capsule 5  . hydroxychloroquine (PLAQUENIL) 200 MG tablet Take 200 mg by mouth 2 (two) times daily.    . MULTIPLE VITAMIN PO Take 1 tablet by mouth daily.      Marland Kitchen omeprazole (PRILOSEC) 40 MG capsule Take 1 capsule (40 mg total) by mouth  daily before breakfast. 30 capsule 11  . Probiotic Product (PROBIOTIC DAILY PO) Take by mouth.    . vitamin C (ASCORBIC ACID) 500 MG tablet Take 500 mg by mouth daily.      Marland Kitchen zolpidem (AMBIEN) 5 MG tablet Take 1 tablet (5 mg total) by mouth at bedtime as needed for sleep. 30 tablet 3   No current facility-administered medications on file prior to visit.    Allergies: No Known Allergies  Review of Systems:  CONSTITUTIONAL: No fevers, chills, night sweats, or weight loss.  EYES: No visual changes or eye pain ENT: No hearing changes.  No history of nose bleeds.   RESPIRATORY: No cough, wheezing and shortness of breath.   CARDIOVASCULAR: Negative for chest pain, and palpitations.   GI: Negative for abdominal discomfort, blood in stools or black stools.  No recent change in bowel habits.   GU:  No history of incontinence.   MUSCLOSKELETAL: No history of joint pain or swelling.  No myalgias.   SKIN: Negative for lesions, rash, and itching.   ENDOCRINE: Negative for cold or heat intolerance, polydipsia or goiter.   PSYCH:  No depression or anxiety symptoms.   NEURO: As Above.   Vital Signs:  BP 110/70 mmHg  Pulse 105  Wt 134 lb 6 oz (60.952 kg)  SpO2 96%  Neurological Exam: MENTAL STATUS including orientation to time, place, person, recent and remote memory, attention span and concentration,  language, and fund of knowledge is normal.  Speech is not dysarthric.  CRANIAL NERVES:Pupils equal round and reactive to light.  Normal conjugate, extra-ocular eye movements in all directions of gaze.  Face is symmetric. Palate elevates symmetrically.  Tongue is midline.  MOTOR:  Motor strength is 5/5 in all extremities.      MSRs:  Reflexes are 2+/4 throughout.  SENSORY: Reduced vibration at the ankles bilaterally  COORDINATION/GAIT:   Gait narrow based and stable.   Data: NCS/EMG of the lower extremities 03/07/2015:  This is a normal study. In particular, there is no evidence of a  generalized sensorimotor polyneuropathy or left lumbosacral radiculopathy.  MRI Lumbar spine wwo contrast 12/27/2008: 1. Stable appearance of the marrow in the lumbar spine compared with prior study from 06/03/2008. Findings may reflect a normal variant, although myelofibrosis remains possible. Correlation with complete blood count is recommended. 2. No evidence of pathologic fracture or paraspinal abnormality. 3. No disc herniation, spinal stenosis or nerve root encroachment.  Labs 02/20/2015:  Vitamin B1 12, copper 94, zinc 59*, SPEP with IFE no M protein   IMPRESSION: 1.  Painful small fiber neuropathy, risk factors - alcohol and lupus  - Paresthesias well controlled on gabapentin 300mg  TID  - Neuropathy labs normal  - NCS/EMG of the legs is normal 2.  Tobacco use disorder  PLAN/RECOMMENDATIONS:  Continue gabapentin 300mg  three times daily Encouraged to stop smoking  Return to clinic in 1 year   The duration of this appointment visit was 20 minutes of face-to-face time with the patient.  Greater than 50% of this time was spent in counseling, explanation of diagnosis, planning of further management, and coordination of care.   Thank you for allowing me to participate in patient's care.  If I can answer any additional questions, I would be pleased to do so.    Sincerely,    Keerthi Hazell K. Posey Pronto, DO

## 2015-06-12 NOTE — Patient Instructions (Addendum)
Continue gabapentin 300mg  three times daily Encouraged to stop smoking Return to clinic in 1 year

## 2015-06-20 DIAGNOSIS — M5032 Other cervical disc degeneration, mid-cervical region, unspecified level: Secondary | ICD-10-CM | POA: Diagnosis not present

## 2015-06-20 DIAGNOSIS — M9901 Segmental and somatic dysfunction of cervical region: Secondary | ICD-10-CM | POA: Diagnosis not present

## 2015-06-20 DIAGNOSIS — M5134 Other intervertebral disc degeneration, thoracic region: Secondary | ICD-10-CM | POA: Diagnosis not present

## 2015-06-20 DIAGNOSIS — M9902 Segmental and somatic dysfunction of thoracic region: Secondary | ICD-10-CM | POA: Diagnosis not present

## 2015-06-20 DIAGNOSIS — M9903 Segmental and somatic dysfunction of lumbar region: Secondary | ICD-10-CM | POA: Diagnosis not present

## 2015-06-20 DIAGNOSIS — M5136 Other intervertebral disc degeneration, lumbar region: Secondary | ICD-10-CM | POA: Diagnosis not present

## 2015-07-06 DIAGNOSIS — H2513 Age-related nuclear cataract, bilateral: Secondary | ICD-10-CM | POA: Diagnosis not present

## 2015-07-06 DIAGNOSIS — Z79899 Other long term (current) drug therapy: Secondary | ICD-10-CM | POA: Diagnosis not present

## 2015-07-06 DIAGNOSIS — M329 Systemic lupus erythematosus, unspecified: Secondary | ICD-10-CM | POA: Diagnosis not present

## 2015-07-06 DIAGNOSIS — H25013 Cortical age-related cataract, bilateral: Secondary | ICD-10-CM | POA: Diagnosis not present

## 2015-07-11 DIAGNOSIS — M5134 Other intervertebral disc degeneration, thoracic region: Secondary | ICD-10-CM | POA: Diagnosis not present

## 2015-07-11 DIAGNOSIS — M9903 Segmental and somatic dysfunction of lumbar region: Secondary | ICD-10-CM | POA: Diagnosis not present

## 2015-07-11 DIAGNOSIS — M9901 Segmental and somatic dysfunction of cervical region: Secondary | ICD-10-CM | POA: Diagnosis not present

## 2015-07-11 DIAGNOSIS — M5032 Other cervical disc degeneration, mid-cervical region, unspecified level: Secondary | ICD-10-CM | POA: Diagnosis not present

## 2015-07-11 DIAGNOSIS — M9902 Segmental and somatic dysfunction of thoracic region: Secondary | ICD-10-CM | POA: Diagnosis not present

## 2015-07-11 DIAGNOSIS — M5136 Other intervertebral disc degeneration, lumbar region: Secondary | ICD-10-CM | POA: Diagnosis not present

## 2015-07-20 ENCOUNTER — Telehealth: Payer: Self-pay | Admitting: Internal Medicine

## 2015-07-20 MED ORDER — ZOLPIDEM TARTRATE 5 MG PO TABS: 5.0000 mg | ORAL_TABLET | Freq: Every evening | ORAL | Status: DC | PRN

## 2015-07-20 NOTE — Telephone Encounter (Signed)
Patient is requesting a refill of zolpidem (AMBIEN) 5 MG tablet [id number] [facility] [address]

## 2015-07-23 ENCOUNTER — Other Ambulatory Visit: Payer: Self-pay | Admitting: Internal Medicine

## 2015-07-24 NOTE — Telephone Encounter (Signed)
Notified [facility] spoke w/Kirsten verified if they received zolipdem script she stated No. Gave verbal order on med. Notified pt rx has been call to pharmacy...Sheila Morales

## 2015-07-24 NOTE — Telephone Encounter (Signed)
Pt stopped by and said [facility] on [address] has not yet received the prescription. Can you please send it back over to them

## 2015-07-25 NOTE — Telephone Encounter (Signed)
PA was required on Zolpidem Rx. Pt;s insurance company called and PA was initiated and approved via phone.  Approval letter to follow via fax

## 2015-07-25 NOTE — Telephone Encounter (Signed)
Patient came back into office this morning stating [facility] not [facility]---i called [facility] and they said there is a problem with patient's insurance, patient is calling her insurance company to work out problem---advised patient to call Frances Joynt back if there's something else our office needs to do

## 2015-08-08 DIAGNOSIS — M9902 Segmental and somatic dysfunction of thoracic region: Secondary | ICD-10-CM | POA: Diagnosis not present

## 2015-08-08 DIAGNOSIS — M9901 Segmental and somatic dysfunction of cervical region: Secondary | ICD-10-CM | POA: Diagnosis not present

## 2015-08-08 DIAGNOSIS — M5136 Other intervertebral disc degeneration, lumbar region: Secondary | ICD-10-CM | POA: Diagnosis not present

## 2015-08-08 DIAGNOSIS — M5032 Other cervical disc degeneration, mid-cervical region, unspecified level: Secondary | ICD-10-CM | POA: Diagnosis not present

## 2015-08-08 DIAGNOSIS — M9903 Segmental and somatic dysfunction of lumbar region: Secondary | ICD-10-CM | POA: Diagnosis not present

## 2015-08-08 DIAGNOSIS — M5134 Other intervertebral disc degeneration, thoracic region: Secondary | ICD-10-CM | POA: Diagnosis not present

## 2015-09-05 DIAGNOSIS — M9903 Segmental and somatic dysfunction of lumbar region: Secondary | ICD-10-CM | POA: Diagnosis not present

## 2015-09-05 DIAGNOSIS — M9901 Segmental and somatic dysfunction of cervical region: Secondary | ICD-10-CM | POA: Diagnosis not present

## 2015-09-05 DIAGNOSIS — M9902 Segmental and somatic dysfunction of thoracic region: Secondary | ICD-10-CM | POA: Diagnosis not present

## 2015-09-05 DIAGNOSIS — M5032 Other cervical disc degeneration, mid-cervical region, unspecified level: Secondary | ICD-10-CM | POA: Diagnosis not present

## 2015-09-05 DIAGNOSIS — M5136 Other intervertebral disc degeneration, lumbar region: Secondary | ICD-10-CM | POA: Diagnosis not present

## 2015-09-05 DIAGNOSIS — M5134 Other intervertebral disc degeneration, thoracic region: Secondary | ICD-10-CM | POA: Diagnosis not present

## 2015-09-26 DIAGNOSIS — L720 Epidermal cyst: Secondary | ICD-10-CM | POA: Diagnosis not present

## 2015-09-26 DIAGNOSIS — L930 Discoid lupus erythematosus: Secondary | ICD-10-CM | POA: Diagnosis not present

## 2015-10-04 DIAGNOSIS — L932 Other local lupus erythematosus: Secondary | ICD-10-CM | POA: Diagnosis not present

## 2015-10-04 DIAGNOSIS — R5383 Other fatigue: Secondary | ICD-10-CM | POA: Diagnosis not present

## 2015-10-04 DIAGNOSIS — M328 Other forms of systemic lupus erythematosus: Secondary | ICD-10-CM | POA: Diagnosis not present

## 2015-11-07 ENCOUNTER — Other Ambulatory Visit: Payer: Self-pay | Admitting: Neurology

## 2015-11-07 NOTE — Telephone Encounter (Signed)
Left message giving patient instructions. 

## 2015-11-07 NOTE — Telephone Encounter (Signed)
Patient would like a refill on gabapentin but would like to increase the strength.  Please advise.

## 2015-11-07 NOTE — Telephone Encounter (Signed)
Okay to increase to gabapentin 600mg  three times daily, please instruct her to increase by 1 tablet every 3 days.  She should stay at the lower dose, if it makes her too sleepy.

## 2015-12-26 ENCOUNTER — Ambulatory Visit (INDEPENDENT_AMBULATORY_CARE_PROVIDER_SITE_OTHER): Payer: Medicare Other | Admitting: Internal Medicine

## 2015-12-26 ENCOUNTER — Encounter: Payer: Self-pay | Admitting: Internal Medicine

## 2015-12-26 DIAGNOSIS — L930 Discoid lupus erythematosus: Secondary | ICD-10-CM | POA: Diagnosis not present

## 2015-12-26 DIAGNOSIS — R05 Cough: Secondary | ICD-10-CM | POA: Diagnosis not present

## 2015-12-26 DIAGNOSIS — F17200 Nicotine dependence, unspecified, uncomplicated: Secondary | ICD-10-CM

## 2015-12-26 DIAGNOSIS — R059 Cough, unspecified: Secondary | ICD-10-CM

## 2015-12-26 MED ORDER — HYDROCODONE-HOMATROPINE 5-1.5 MG/5ML PO SYRP: 5.0000 mL | ORAL_SOLUTION | Freq: Three times a day (TID) | ORAL | 0 refills | Status: DC | PRN

## 2015-12-26 NOTE — Patient Instructions (Signed)
We have given you a cough syrup to use for the cough. This could be coming from the smoking and you need to stop if you are able.   Options to stop:  Nicotine patch Nicotine gum chantix Wellbutrin Hypnosis ([facility] in [place] is good)

## 2015-12-26 NOTE — Progress Notes (Signed)
   Subjective:    Patient ID: Sheila Morales, female    DOB: Aug 08, 1950, 65 y.o.   MRN: [id number]  HPI The patient is a 65 YO female coming in for cough for about 3 weeks. She is smoking more lately and 2 PPD. She is in a study for lupus and is getting injections (every 2 weeks, now every month, she does not know what kind of medicine it is, drug not approved yet). Denies nasal congestion or drainage. Does have GERD and symptoms are well controlled, no stomach pain. Cough is worse in the evening and when she gets around allergen. She denies SOB. No fevers or chills.   Review of Systems  Constitutional: Negative for activity change, appetite change, fatigue and fever.  HENT: Negative for congestion, postnasal drip, rhinorrhea, sinus pressure and sore throat.   Eyes: Negative.   Respiratory: Positive for cough. Negative for chest tightness, shortness of breath and wheezing.   Cardiovascular: Negative for chest pain, palpitations and leg swelling.  Gastrointestinal: Negative for abdominal distention, abdominal pain, constipation and diarrhea.  Musculoskeletal: Negative.   Skin: Negative.       Objective:   Physical Exam  Constitutional: She is oriented to person, place, and time. She appears well-developed and well-nourished. No distress.  HENT:  Head: Normocephalic and atraumatic.  Oropharynx without redness or drainage.   Eyes: EOM are normal.  Neck: Normal range of motion.  Cardiovascular: Normal rate and regular rhythm.   Pulmonary/Chest: Effort normal and breath sounds normal. No respiratory distress. She has no wheezes. She has no rales.  Abdominal: Soft. Bowel sounds are normal. She exhibits no distension. There is no tenderness. There is no rebound.  Neurological: She is alert and oriented to person, place, and time. No cranial nerve deficit.  Skin: Skin is warm and dry.   Vitals:   12/26/15 1513  BP: 128/78  Pulse: 95  Resp: 20  Temp: 98.7 F (37.1 C)  TempSrc: Oral  SpO2:  95%  Weight: 137 lb (62.1 kg)  Height: 5' 2.5" (1.588 m)      Assessment & Plan:

## 2015-12-26 NOTE — Assessment & Plan Note (Signed)
Reminded about the risks and harms from smoking and encouraged her to quit. Options given for smoking cessation to talk to her lupus doctor (given that they know what medication she is on in the study).

## 2015-12-26 NOTE — Assessment & Plan Note (Signed)
Possibly related to her GERD (glass of wine in the evening right before bed) with worse coughing in the evening but also 2 PPD smoker and could be related to that. Lungs clear on exam, no signs of allergies. Rx for hycodan and she will try to cut back or stop smoking.

## 2015-12-26 NOTE — Progress Notes (Signed)
Pre visit review using our clinic review tool, if applicable. No additional management support is needed unless otherwise documented below in the visit note. 

## 2016-01-30 ENCOUNTER — Ambulatory Visit (INDEPENDENT_AMBULATORY_CARE_PROVIDER_SITE_OTHER): Payer: Medicare Other | Admitting: Internal Medicine

## 2016-01-30 ENCOUNTER — Encounter: Payer: Self-pay | Admitting: Internal Medicine

## 2016-01-30 DIAGNOSIS — H60392 Other infective otitis externa, left ear: Secondary | ICD-10-CM

## 2016-01-30 DIAGNOSIS — H60399 Other infective otitis externa, unspecified ear: Secondary | ICD-10-CM | POA: Insufficient documentation

## 2016-01-30 MED ORDER — NEOMYCIN-POLYMYXIN-HC 3.5-10000-1 OT SUSP: 3.0000 [drp] | Freq: Three times a day (TID) | OTIC | 0 refills | Status: DC

## 2016-01-30 NOTE — Progress Notes (Signed)
Pre visit review using our clinic review tool, if applicable. No additional management support is needed unless otherwise documented below in the visit note. 

## 2016-01-30 NOTE — Patient Instructions (Signed)
We have sent in ear drops for you called corticosporin. Use 3 drops in the left ear 3 times per day for the next 5 days.   If the hearing has not changed after the drops are done call us back and we will get you in with the ear specialist.

## 2016-01-30 NOTE — Progress Notes (Signed)
   Subjective:    Patient ID: Sheila Morales, female    DOB: 07-26-1950, 65 y.o.   MRN: [id number]  HPI The patient is a 65 YO female coming in for pain in her left ear starting 3-4 days ago. Accompanied by hearing loss in that ear. She has tried ear drops, heating pad, and washing out the ear which has not helped. She has not noticed any change since onset. She is not having symptoms in the right ear. No sinus congestion or drainage. No sore throat. No cough or SOB.   Review of Systems  Constitutional: Negative for activity change, appetite change, chills, fatigue, fever and unexpected weight change.  HENT: Positive for ear discharge, ear pain and hearing loss. Negative for congestion, postnasal drip, rhinorrhea, sinus pressure, sore throat and trouble swallowing.   Respiratory: Negative.   Cardiovascular: Negative.   Gastrointestinal: Negative.   Musculoskeletal: Negative.   Neurological: Negative.       Objective:   Physical Exam  Constitutional: She is oriented to person, place, and time. She appears well-developed and well-nourished.  HENT:  Head: Normocephalic and atraumatic.  Right Ear: External ear normal.  Nose: Nose normal.  Mouth/Throat: Oropharynx is clear and moist.  Left ear with redness in the canal and pain with tugging on the ear  Eyes: EOM are normal.  Neck: Normal range of motion.  Cardiovascular: Normal rate and regular rhythm.   Pulmonary/Chest: Effort normal and breath sounds normal.  Abdominal: Soft.  Neurological: She is alert and oriented to person, place, and time.  Skin: Skin is warm and dry.   Vitals:   01/30/16 0925  BP: (!) 128/56  Pulse: 85  Resp: 12  Temp: 97.5 F (36.4 C)  TempSrc: Oral  SpO2: 95%  Weight: 135 lb (61.2 kg)  Height: 5' 2.5" (1.588 m)      Assessment & Plan:

## 2016-01-30 NOTE — Assessment & Plan Note (Signed)
Rx for corticosporin ear drops. If no change in hearing referral to ENT.

## 2016-02-02 ENCOUNTER — Telehealth: Payer: Self-pay | Admitting: Emergency Medicine

## 2016-02-02 DIAGNOSIS — H9190 Unspecified hearing loss, unspecified ear: Secondary | ICD-10-CM

## 2016-02-02 NOTE — Telephone Encounter (Signed)
Pt called and stated you gave her some drops for her ear. She wanted to let you know it hasnt helped any and still cant hear out of it. She wants to know if you want to refer her out to a specialist? Please advise thanks.

## 2016-02-02 NOTE — Telephone Encounter (Signed)
Referral placed to ENT doctor to evaluate the ear and hearing.

## 2016-02-12 DIAGNOSIS — H9312 Tinnitus, left ear: Secondary | ICD-10-CM | POA: Diagnosis not present

## 2016-02-12 DIAGNOSIS — H9122 Sudden idiopathic hearing loss, left ear: Secondary | ICD-10-CM | POA: Diagnosis not present

## 2016-02-12 DIAGNOSIS — F17200 Nicotine dependence, unspecified, uncomplicated: Secondary | ICD-10-CM | POA: Diagnosis not present

## 2016-02-12 DIAGNOSIS — H903 Sensorineural hearing loss, bilateral: Secondary | ICD-10-CM | POA: Diagnosis not present

## 2016-02-19 ENCOUNTER — Other Ambulatory Visit: Payer: Self-pay | Admitting: Internal Medicine

## 2016-02-19 ENCOUNTER — Ambulatory Visit (INDEPENDENT_AMBULATORY_CARE_PROVIDER_SITE_OTHER): Payer: Medicare Other | Admitting: Internal Medicine

## 2016-02-19 ENCOUNTER — Encounter: Payer: Self-pay | Admitting: Internal Medicine

## 2016-02-19 VITALS — BP 138/72 | HR 97 | Temp 98.4°F | Resp 14 | Ht 62.5 in | Wt 141.0 lb

## 2016-02-19 DIAGNOSIS — Z0000 Encounter for general adult medical examination without abnormal findings: Secondary | ICD-10-CM | POA: Insufficient documentation

## 2016-02-19 DIAGNOSIS — Z23 Encounter for immunization: Secondary | ICD-10-CM

## 2016-02-19 DIAGNOSIS — Z1231 Encounter for screening mammogram for malignant neoplasm of breast: Secondary | ICD-10-CM

## 2016-02-19 DIAGNOSIS — F17200 Nicotine dependence, unspecified, uncomplicated: Secondary | ICD-10-CM

## 2016-02-19 NOTE — Progress Notes (Signed)
   Subjective:    Patient ID: Sheila Morales, female    DOB: 14-Jan-1951, 65 y.o.   MRN: [id number]  HPI Here for medicare wellness, no new complaints. Please see A/P for status and treatment of chronic medical problems.   Diet: regular Physical activity: active Depression/mood screen: negative Hearing: intact to whispered voice in right, left without hearing, seeing ENT and recent hearing test Visual acuity: grossly normal, performs annual eye exam with Herbert Deaner ADLs: capable Fall risk: none Home safety: good Cognitive evaluation: intact to orientation, naming, recall and repetition EOL planning: adv directives discussed  I have personally reviewed and have noted 1. The patient's medical and social history - reviewed today no changes 2. Their use of alcohol, tobacco or illicit drugs 3. Their current medications and supplements 4. The patient's functional ability including ADL's, fall risks, home safety risks and hearing or visual impairment. 5. Diet and physical activities 6. Evidence for depression or mood disorders 7. Care team reviewed and updated (available in snapshot)  Review of Systems  Constitutional: Negative.   HENT: Positive for hearing loss. Negative for congestion, dental problem, ear discharge, ear pain, sinus pressure, sneezing and sore throat.   Eyes: Negative.   Respiratory: Negative for cough, chest tightness, shortness of breath and wheezing.   Cardiovascular: Negative for chest pain, palpitations and leg swelling.  Gastrointestinal: Negative for abdominal distention, abdominal pain, constipation, diarrhea and nausea.  Musculoskeletal: Negative.   Skin: Negative.   Neurological: Negative.   Psychiatric/Behavioral: Negative.       Objective:   Physical Exam  Constitutional: She is oriented to person, place, and time. She appears well-developed and well-nourished.  HENT:  Head: Normocephalic and atraumatic.  Right Ear: External ear normal.  Left Ear: External  ear normal.  Eyes: EOM are normal.  Neck: Normal range of motion.  Cardiovascular: Normal rate and regular rhythm.   Pulmonary/Chest: Effort normal. No respiratory distress. She has no wheezes. She has no rales.  Abdominal: Soft. Bowel sounds are normal. She exhibits no distension. There is no tenderness. There is no rebound.  Musculoskeletal: She exhibits no edema.  Neurological: She is alert and oriented to person, place, and time.  Skin: Skin is warm and dry.  Psychiatric: She has a normal mood and affect.   Vitals:   02/19/16 1306  BP: 138/72  Pulse: 97  Resp: 14  Temp: 98.4 F (36.9 C)  TempSrc: Oral  SpO2: 97%  Weight: 141 lb (64 kg)  Height: 5' 2.5" (1.588 m)      Assessment & Plan:  Prevnar 13 and flu shot given at visit.

## 2016-02-19 NOTE — Assessment & Plan Note (Signed)
Ordered mammogram. Given flu and prevnar 13 shot today. Will get tdap next visit and last pneumonia next year. Counseled about dexa and will wait for now. Declines labs today due to frequent with recent clinical study and upcoming rheumatology appointment. Counseled on smoking cessation needs, sun safety and mole surveillance (with plaquenil). Given 10 year screening recommendations.

## 2016-02-19 NOTE — Assessment & Plan Note (Signed)
Stopped 1 month ago and is still working on fully quitting. Encouraged her to continue. Using patches over the counter.

## 2016-02-19 NOTE — Patient Instructions (Addendum)
We have put in the order for the mammogram so you can call to get it scheduled: Breast center: [address], [place], [place] [address]  Phone: [phone]  We will get the labs from Dr. Trudie Reed to check the cholesterol and make sure it is good.   Keep up the good work with stopping smoking! This is the most important thing for your health!  Smoking Cessation, Tips for Success If you are ready to quit smoking, congratulations! You have chosen to help yourself be healthier. Cigarettes bring nicotine, tar, carbon monoxide, and other irritants into your body. Your lungs, heart, and blood vessels will be able to work better without these poisons. There are many different ways to quit smoking. Nicotine gum, nicotine patches, a nicotine inhaler, or nicotine nasal spray can help with physical craving. Hypnosis, support groups, and medicines help break the habit of smoking. WHAT THINGS CAN I DO TO MAKE QUITTING EASIER?  Here are some tips to help you quit for good:  Pick a date when you will quit smoking completely. Tell all of your friends and family about your plan to quit on that date.  Do not try to slowly cut down on the number of cigarettes you are smoking. Pick a quit date and quit smoking completely starting on that day.  Throw away all cigarettes.   Clean and remove all ashtrays from your home, work, and car.  On a card, write down your reasons for quitting. Carry the card with you and read it when you get the urge to smoke.  Cleanse your body of nicotine. Drink enough water and fluids to keep your urine clear or pale yellow. Do this after quitting to flush the nicotine from your body.  Learn to predict your moods. Do not let a bad situation be your excuse to have a cigarette. Some situations in your life might tempt you into wanting a cigarette.  Never have "just one" cigarette. It leads to wanting another and another. Remind yourself of your decision to quit.  Change habits  associated with smoking. If you smoked while driving or when feeling stressed, try other activities to replace smoking. Stand up when drinking your coffee. Brush your teeth after eating. Sit in a different chair when you read the paper. Avoid alcohol while trying to quit, and try to drink fewer caffeinated beverages. Alcohol and caffeine may urge you to smoke.  Avoid foods and drinks that can trigger a desire to smoke, such as sugary or spicy foods and alcohol.  Ask people who smoke not to smoke around you.  Have something planned to do right after eating or having a cup of coffee. For example, plan to take a walk or exercise.  Try a relaxation exercise to calm you down and decrease your stress. Remember, you may be tense and nervous for the first 2 weeks after you quit, but this will pass.  Find new activities to keep your hands busy. Play with a pen, coin, or rubber band. Doodle or draw things on paper.  Brush your teeth right after eating. This will help cut down on the craving for the taste of tobacco after meals. You can also try mouthwash.   Use oral substitutes in place of cigarettes. Try using lemon drops, carrots, cinnamon sticks, or chewing gum. Keep them handy so they are available when you have the urge to smoke.  When you have the urge to smoke, try deep breathing.  Designate your home as a  nonsmoking area.  If you are a heavy smoker, ask your health care provider about a prescription for nicotine chewing gum. It can ease your withdrawal from nicotine.  Reward yourself. Set aside the cigarette money you save and buy yourself something nice.  Look for support from others. Join a support group or smoking cessation program. Ask someone at home or at work to help you with your plan to quit smoking.  Always ask yourself, "Do I need this cigarette or is this just a reflex?" Tell yourself, "Today, I choose not to smoke," or "I do not want to smoke." You are reminding yourself of your  decision to quit.  Do not replace cigarette smoking with electronic cigarettes (commonly called e-cigarettes). The safety of e-cigarettes is unknown, and some may contain harmful chemicals.  If you relapse, do not give up! Plan ahead and think about what you will do the next time you get the urge to smoke. HOW WILL I FEEL WHEN I QUIT SMOKING? You may have symptoms of withdrawal because your body is used to nicotine (the addictive substance in cigarettes). You may crave cigarettes, be irritable, feel very hungry, cough often, get headaches, or have difficulty concentrating. The withdrawal symptoms are only temporary. They are strongest when you first quit but will go away within 10-14 days. When withdrawal symptoms occur, stay in control. Think about your reasons for quitting. Remind yourself that these are signs that your body is healing and getting used to being without cigarettes. Remember that withdrawal symptoms are easier to treat than the major diseases that smoking can cause.  Even after the withdrawal is over, expect periodic urges to smoke. However, these cravings are generally short lived and will go away whether you smoke or not. Do not smoke! WHAT RESOURCES ARE AVAILABLE TO HELP ME QUIT SMOKING? Your health care provider can direct you to community resources or hospitals for support, which may include:  Group support.  Education.  Hypnosis.  Therapy.   This information is not intended to replace advice given to you by your health care provider. Make sure you discuss any questions you have with your health care provider.   Document Released: 01/05/2004 Document Revised: 04/29/2014 Document Reviewed: 09/24/2012 Elsevier Interactive Patient Education 2016 [place] Maintenance, Female Adopting a healthy lifestyle and getting preventive care can go a long way to promote health and wellness. Talk with your health care provider about what schedule of regular examinations  is right for you. This is a good chance for you to check in with your provider about disease prevention and staying healthy. In between checkups, there are plenty of things you can do on your own. Experts have done a lot of research about which lifestyle changes and preventive measures are most likely to keep you healthy. Ask your health care provider for more information. WEIGHT AND DIET  Eat a healthy diet  Be sure to include plenty of vegetables, fruits, low-fat dairy products, and lean protein.  Do not eat a lot of foods high in solid fats, added sugars, or salt.  Get regular exercise. This is one of the most important things you can do for your health.  Most adults should exercise for at least 150 minutes each week. The exercise should increase your heart rate and make you sweat (moderate-intensity exercise).  Most adults should also do strengthening exercises at least twice a week. This is in addition to the moderate-intensity exercise.  Maintain a healthy weight  Body mass  index (BMI) is a measurement that can be used to identify possible weight problems. It estimates body fat based on height and weight. Your health care provider can help determine your BMI and help you achieve or maintain a healthy weight.  For females 39 years of age and older:   A BMI below 18.5 is considered underweight.  A BMI of 18.5 to 24.9 is normal.  A BMI of 25 to 29.9 is considered overweight.  A BMI of 30 and above is considered obese.  Watch levels of cholesterol and blood lipids  You should start having your blood tested for lipids and cholesterol at 66 years of age, then have this test every 5 years.  You may need to have your cholesterol levels checked more often if:  Your lipid or cholesterol levels are high.  You are older than 66 years of age.  You are at high risk for heart disease.  CANCER SCREENING   Lung Cancer  Lung cancer screening is recommended for adults 6-80 years  old who are at high risk for lung cancer because of a history of smoking.  A yearly low-dose CT scan of the lungs is recommended for people who:  Currently smoke.  Have quit within the past 15 years.  Have at least a 30-pack-year history of smoking. A pack year is smoking an average of one pack of cigarettes a day for 1 year.  Yearly screening should continue until it has been 15 years since you quit.  Yearly screening should stop if you develop a health problem that would prevent you from having lung cancer treatment.  Breast Cancer  Practice breast self-awareness. This means understanding how your breasts normally appear and feel.  It also means doing regular breast self-exams. Let your health care provider know about any changes, no matter how small.  If you are in your 20s or 30s, you should have a clinical breast exam (CBE) by a health care provider every 1-3 years as part of a regular health exam.  If you are 61 or older, have a CBE every year. Also consider having a breast X-ray (mammogram) every year.  If you have a family history of breast cancer, talk to your health care provider about genetic screening.  If you are at high risk for breast cancer, talk to your health care provider about having an MRI and a mammogram every year.  Breast cancer gene (BRCA) assessment is recommended for women who have family members with BRCA-related cancers. BRCA-related cancers include:  Breast.  Ovarian.  Tubal.  Peritoneal cancers.  Results of the assessment will determine the need for genetic counseling and BRCA1 and BRCA2 testing. Cervical Cancer Your health care provider may recommend that you be screened regularly for cancer of the pelvic organs (ovaries, uterus, and vagina). This screening involves a pelvic examination, including checking for microscopic changes to the surface of your cervix (Pap test). You may be encouraged to have this screening done every 3 years, beginning  at age 11.  For women ages 17-65, health care providers may recommend pelvic exams and Pap testing every 3 years, or they may recommend the Pap and pelvic exam, combined with testing for human papilloma virus (HPV), every 5 years. Some types of HPV increase your risk of cervical cancer. Testing for HPV may also be done on women of any age with unclear Pap test results.  Other health care providers may not recommend any screening for nonpregnant women who are considered low risk  for pelvic cancer and who do not have symptoms. Ask your health care provider if a screening pelvic exam is right for you.  If you have had past treatment for cervical cancer or a condition that could lead to cancer, you need Pap tests and screening for cancer for at least 20 years after your treatment. If Pap tests have been discontinued, your risk factors (such as having a new sexual partner) need to be reassessed to determine if screening should resume. Some women have medical problems that increase the chance of getting cervical cancer. In these cases, your health care provider may recommend more frequent screening and Pap tests. Colorectal Cancer  This type of cancer can be detected and often prevented.  Routine colorectal cancer screening usually begins at 66 years of age and continues through 66 years of age.  Your health care provider may recommend screening at an earlier age if you have risk factors for colon cancer.  Your health care provider may also recommend using home test kits to check for hidden blood in the stool.  A small camera at the end of a tube can be used to examine your colon directly (sigmoidoscopy or colonoscopy). This is done to check for the earliest forms of colorectal cancer.  Routine screening usually begins at age 68.  Direct examination of the colon should be repeated every 5-10 years through 66 years of age. However, you may need to be screened more often if early forms of precancerous  polyps or small growths are found. Skin Cancer  Check your skin from head to toe regularly.  Tell your health care provider about any new moles or changes in moles, especially if there is a change in a mole's shape or color.  Also tell your health care provider if you have a mole that is larger than the size of a pencil eraser.  Always use sunscreen. Apply sunscreen liberally and repeatedly throughout the day.  Protect yourself by wearing long sleeves, pants, a wide-brimmed hat, and sunglasses whenever you are outside. HEART DISEASE, DIABETES, AND HIGH BLOOD PRESSURE   High blood pressure causes heart disease and increases the risk of stroke. High blood pressure is more likely to develop in:  People who have blood pressure in the high end of the normal range (130-139/85-89 mm Hg).  People who are overweight or obese.  People who are African American.  If you are 33-65 years of age, have your blood pressure checked every 3-5 years. If you are 21 years of age or older, have your blood pressure checked every year. You should have your blood pressure measured twice--once when you are at a hospital or clinic, and once when you are not at a hospital or clinic. Record the average of the two measurements. To check your blood pressure when you are not at a hospital or clinic, you can use:  An automated blood pressure machine at a pharmacy.  A home blood pressure monitor.  If you are between 27 years and 49 years old, ask your health care provider if you should take aspirin to prevent strokes.  Have regular diabetes screenings. This involves taking a blood sample to check your fasting blood sugar level.  If you are at a normal weight and have a low risk for diabetes, have this test once every three years after 66 years of age.  If you are overweight and have a high risk for diabetes, consider being tested at a younger age or more often. PREVENTING  INFECTION  Hepatitis B  If you have a  higher risk for hepatitis B, you should be screened for this virus. You are considered at high risk for hepatitis B if:  You were born in a country where hepatitis B is common. Ask your health care provider which countries are considered high risk.  Your parents were born in a high-risk country, and you have not been immunized against hepatitis B (hepatitis B vaccine).  You have HIV or AIDS.  You use needles to inject street drugs.  You live with someone who has hepatitis B.  You have had sex with someone who has hepatitis B.  You get hemodialysis treatment.  You take certain medicines for conditions, including cancer, organ transplantation, and autoimmune conditions. Hepatitis C  Blood testing is recommended for:  Everyone born from [birth year] through 1966.  Anyone with known risk factors for hepatitis C. Sexually transmitted infections (STIs)  You should be screened for sexually transmitted infections (STIs) including gonorrhea and chlamydia if:  You are sexually active and are younger than 66 years of age.  You are older than 66 years of age and your health care provider tells you that you are at risk for this type of infection.  Your sexual activity has changed since you were last screened and you are at an increased risk for chlamydia or gonorrhea. Ask your health care provider if you are at risk.  If you do not have HIV, but are at risk, it may be recommended that you take a prescription medicine daily to prevent HIV infection. This is called pre-exposure prophylaxis (PrEP). You are considered at risk if:  You are sexually active and do not regularly use condoms or know the HIV status of your partner(s).  You take drugs by injection.  You are sexually active with a partner who has HIV. Talk with your health care provider about whether you are at high risk of being infected with HIV. If you choose to begin PrEP, you should first be tested for HIV. You should then be tested  every 3 months for as long as you are taking PrEP.  PREGNANCY   If you are premenopausal and you may become pregnant, ask your health care provider about preconception counseling.  If you may become pregnant, take 400 to 800 micrograms (mcg) of folic acid every day.  If you want to prevent pregnancy, talk to your health care provider about birth control (contraception). OSTEOPOROSIS AND MENOPAUSE   Osteoporosis is a disease in which the bones lose minerals and strength with aging. This can result in serious bone fractures. Your risk for osteoporosis can be identified using a bone density scan.  If you are 67 years of age or older, or if you are at risk for osteoporosis and fractures, ask your health care provider if you should be screened.  Ask your health care provider whether you should take a calcium or vitamin D supplement to lower your risk for osteoporosis.  Menopause may have certain physical symptoms and risks.  Hormone replacement therapy may reduce some of these symptoms and risks. Talk to your health care provider about whether hormone replacement therapy is right for you.  HOME CARE INSTRUCTIONS   Schedule regular health, dental, and eye exams.  Stay current with your immunizations.   Do not use any tobacco products including cigarettes, chewing tobacco, or electronic cigarettes.  If you are pregnant, do not drink alcohol.  If you are breastfeeding, limit how much and how often  you drink alcohol.  Limit alcohol intake to no more than 1 drink per day for nonpregnant women. One drink equals 12 ounces of beer, 5 ounces of wine, or 1 ounces of hard liquor.  Do not use street drugs.  Do not share needles.  Ask your health care provider for help if you need support or information about quitting drugs.  Tell your health care provider if you often feel depressed.  Tell your health care provider if you have ever been abused or do not feel safe at home.   This  information is not intended to replace advice given to you by your health care provider. Make sure you discuss any questions you have with your health care provider.   Document Released: 10/22/2010 Document Revised: 04/29/2014 Document Reviewed: 03/10/2013 Elsevier Interactive Patient Education [employer].

## 2016-02-19 NOTE — Progress Notes (Signed)
Pre visit review using our clinic review tool, if applicable. No additional management support is needed unless otherwise documented below in the visit note. 

## 2016-02-27 ENCOUNTER — Other Ambulatory Visit: Payer: Self-pay | Admitting: Internal Medicine

## 2016-02-27 ENCOUNTER — Ambulatory Visit
Admission: RE | Admit: 2016-02-27 | Discharge: 2016-02-27 | Disposition: A | Discharge status: Home / Self Care | Payer: Medicare Other | Source: Ambulatory Visit | Attending: Internal Medicine | Admitting: Internal Medicine

## 2016-02-27 DIAGNOSIS — H9312 Tinnitus, left ear: Secondary | ICD-10-CM | POA: Diagnosis not present

## 2016-02-27 DIAGNOSIS — H9042 Sensorineural hearing loss, unilateral, left ear, with unrestricted hearing on the contralateral side: Secondary | ICD-10-CM | POA: Diagnosis not present

## 2016-02-27 DIAGNOSIS — H9122 Sudden idiopathic hearing loss, left ear: Secondary | ICD-10-CM | POA: Diagnosis not present

## 2016-02-27 DIAGNOSIS — Z1231 Encounter for screening mammogram for malignant neoplasm of breast: Secondary | ICD-10-CM | POA: Diagnosis not present

## 2016-02-27 NOTE — Telephone Encounter (Signed)
Faxed to pharmacy

## 2016-03-08 DIAGNOSIS — H0012 Chalazion right lower eyelid: Secondary | ICD-10-CM | POA: Diagnosis not present

## 2016-03-08 DIAGNOSIS — H04123 Dry eye syndrome of bilateral lacrimal glands: Secondary | ICD-10-CM | POA: Diagnosis not present

## 2016-03-08 DIAGNOSIS — H01003 Unspecified blepharitis right eye, unspecified eyelid: Secondary | ICD-10-CM | POA: Diagnosis not present

## 2016-04-08 DIAGNOSIS — Z6825 Body mass index (BMI) 25.0-25.9, adult: Secondary | ICD-10-CM | POA: Diagnosis not present

## 2016-04-08 DIAGNOSIS — Z79899 Other long term (current) drug therapy: Secondary | ICD-10-CM | POA: Diagnosis not present

## 2016-04-08 DIAGNOSIS — L932 Other local lupus erythematosus: Secondary | ICD-10-CM | POA: Diagnosis not present

## 2016-04-08 DIAGNOSIS — E663 Overweight: Secondary | ICD-10-CM | POA: Diagnosis not present

## 2016-04-08 DIAGNOSIS — M328 Other forms of systemic lupus erythematosus: Secondary | ICD-10-CM | POA: Diagnosis not present

## 2016-05-07 ENCOUNTER — Ambulatory Visit (INDEPENDENT_AMBULATORY_CARE_PROVIDER_SITE_OTHER): Payer: Medicare Other | Admitting: Internal Medicine

## 2016-05-07 ENCOUNTER — Encounter: Payer: Self-pay | Admitting: Internal Medicine

## 2016-05-07 DIAGNOSIS — B9789 Other viral agents as the cause of diseases classified elsewhere: Secondary | ICD-10-CM

## 2016-05-07 DIAGNOSIS — J069 Acute upper respiratory infection, unspecified: Secondary | ICD-10-CM | POA: Insufficient documentation

## 2016-05-07 MED ORDER — HYDROCODONE-HOMATROPINE 5-1.5 MG/5ML PO SYRP: 5.0000 mL | ORAL_SOLUTION | Freq: Three times a day (TID) | ORAL | 0 refills | Status: DC | PRN

## 2016-05-07 NOTE — Patient Instructions (Signed)
Start taking zyrtec (cetirizine) over the counter daily to help with the drainage. It is okay to take sudafed as well.   We have given you the cough syrup today that you can use at night time for the cough.

## 2016-05-07 NOTE — Assessment & Plan Note (Signed)
Rx for hycodan cough syrup and advised to take zyrtec daily for the congestion. Reassured that she does not need an antibiotic today and advised of typical course.

## 2016-05-07 NOTE — Progress Notes (Signed)
   Subjective:    Patient ID: Sheila Morales, female    DOB: [birth year], 66 y.o.   MRN: [id number]  HPI The patient is a 66 YO female coming in for cough and congestion. Started this last weekend and overall she feels it is the same since onset. She is having no SOB with exertion. Some breathing changes at night time due to congestion and swelling. No fevers or chills. Has tried sudafed and this is helping some.   Review of Systems  Constitutional: Positive for activity change. Negative for appetite change, chills, fatigue, fever and unexpected weight change.  HENT: Positive for congestion, rhinorrhea and sore throat. Negative for drooling, ear discharge, ear pain, postnasal drip, sinus pain and sinus pressure.   Eyes: Negative.   Respiratory: Positive for cough. Negative for chest tightness, shortness of breath and wheezing.   Cardiovascular: Negative.   Gastrointestinal: Negative.   Musculoskeletal: Positive for myalgias.      Objective:   Physical Exam  Constitutional: She is oriented to person, place, and time. She appears well-developed and well-nourished. No distress.  HENT:  Head: Normocephalic and atraumatic.  Oropharynx redness without drainage, no nasal crusting, no sinus pressure. TMs normal bilaterally.   Eyes: EOM are normal.  Neck: Normal range of motion. No JVD present.  Cardiovascular: Normal rate and regular rhythm.   Pulmonary/Chest: Effort normal and breath sounds normal. No respiratory distress. She has no wheezes. She has no rales.  Abdominal: Soft.  Lymphadenopathy:    She has no cervical adenopathy.  Neurological: She is alert and oriented to person, place, and time.  Skin: Skin is warm and dry.   Vitals:   05/07/16 0850  BP: 130/70  Pulse: (!) 115  Resp: 20  Temp: 98.1 F (36.7 C)  TempSrc: Oral  SpO2: 97%  Weight: 141 lb (64 kg)  Height: 5' 2.5" (1.588 m)      Assessment & Plan:

## 2016-05-07 NOTE — Progress Notes (Signed)
Pre visit review using our clinic review tool, if applicable. No additional management support is needed unless otherwise documented below in the visit note. 

## 2016-05-08 ENCOUNTER — Other Ambulatory Visit: Payer: Self-pay | Admitting: Neurology

## 2016-05-28 DIAGNOSIS — L821 Other seborrheic keratosis: Secondary | ICD-10-CM | POA: Diagnosis not present

## 2016-05-28 DIAGNOSIS — D225 Melanocytic nevi of trunk: Secondary | ICD-10-CM | POA: Diagnosis not present

## 2016-05-28 DIAGNOSIS — D2372 Other benign neoplasm of skin of left lower limb, including hip: Secondary | ICD-10-CM | POA: Diagnosis not present

## 2016-05-28 DIAGNOSIS — L820 Inflamed seborrheic keratosis: Secondary | ICD-10-CM | POA: Diagnosis not present

## 2016-05-28 DIAGNOSIS — L930 Discoid lupus erythematosus: Secondary | ICD-10-CM | POA: Diagnosis not present

## 2016-06-13 ENCOUNTER — Ambulatory Visit (INDEPENDENT_AMBULATORY_CARE_PROVIDER_SITE_OTHER): Payer: Medicare Other | Admitting: Neurology

## 2016-06-13 ENCOUNTER — Encounter: Payer: Self-pay | Admitting: Neurology

## 2016-06-13 VITALS — BP 130/80 | HR 103 | Ht 62.5 in | Wt 137.0 lb

## 2016-06-13 DIAGNOSIS — R209 Unspecified disturbances of skin sensation: Secondary | ICD-10-CM | POA: Diagnosis not present

## 2016-06-13 DIAGNOSIS — R202 Paresthesia of skin: Secondary | ICD-10-CM

## 2016-06-13 DIAGNOSIS — R42 Dizziness and giddiness: Secondary | ICD-10-CM | POA: Diagnosis not present

## 2016-06-13 DIAGNOSIS — G621 Alcoholic polyneuropathy: Secondary | ICD-10-CM | POA: Diagnosis not present

## 2016-06-13 DIAGNOSIS — Z720 Tobacco use: Secondary | ICD-10-CM

## 2016-06-13 DIAGNOSIS — G63 Polyneuropathy in diseases classified elsewhere: Secondary | ICD-10-CM

## 2016-06-13 DIAGNOSIS — G629 Polyneuropathy, unspecified: Secondary | ICD-10-CM | POA: Diagnosis not present

## 2016-06-13 DIAGNOSIS — M329 Systemic lupus erythematosus, unspecified: Secondary | ICD-10-CM | POA: Diagnosis not present

## 2016-06-13 MED ORDER — GABAPENTIN 300 MG PO CAPS: ORAL_CAPSULE | ORAL | 3 refills | Status: DC

## 2016-06-13 NOTE — Patient Instructions (Addendum)
1.  US carotids 2.  Start taking 1 tab in the morning and afternoon and 3 tablets at bedtime.  OK to take extra dose as needed 3.  Start aspirin 81mg  daily  Return to clinic 1 year

## 2016-06-13 NOTE — Progress Notes (Signed)
Follow-up Visit   Date: 06/13/16    Sheila Morales MRN: [id number] DOB: 08/26/1950   Interim History: Sheila Morales is a 66 y.o. right-handed Caucasian female with GERD, lupus, irritable bowel syndrome, and tobacco use returning to the clinic for follow-up of small fiber neuropathy of the feet.  The patient was accompanied to the clinic by self.  History of present illness: Starting in 2012, she developing tingling sensation of the feet which was restricted to below the ankles for the first several years. In 2016, it started progressing up her legs, to the level of the knees. Tingling is worse at night and keeps her up from sleeping. She does not paresthesias involving the hands. Denies any problems with balance or any falls this year. She has not tried any medication or seen a neurologist previously.   She drinks 2 glasses of wine nightly for the past two year and can drink more on the weekend. No history of diabetes or thyroid disease. Her lupus was well controlled for 20+ years, but this year she began have flares and skin lesions.  UPDATE 06/11/2014:  She is doing well on gabapentin 300mg  three times daily and reports 90% reduction in painful paresthesias.  No new complaints.  UPDATE 06/13/2016:  She is here for annual follow-up appointment.  She reports that her pain is well controlled on gabapentin 600mg  TID, but sometimes can be worse in the evening.  During the day, she is so busy and barely even notices any discomfort.  She denies any imbalance, weakness, or falls.  She reports having massage therapy and neck manipulation yesterday and since developed numbness and tingling of the cheeks, tongue, and jaw, worse on the left.  Symptoms are slowly improving.  No paresthesias of the arms or legs. No changes in swallowing, talking, or taste.    Medications:  Current Outpatient Prescriptions on File Prior to Visit  Medication Sig Dispense Refill  . cholecalciferol (VITAMIN D) 1000  UNITS tablet Take 1,000 Units by mouth daily.      . colestipol (COLESTID) 5 G granules 1 scoop alternating with 1/2 scoop 500 g 12  . HYDROcodone-homatropine (HYCODAN) 5-1.5 MG/5ML syrup Take 5 mLs by mouth every 8 (eight) hours as needed for cough. 120 mL 0  . hydroxychloroquine (PLAQUENIL) 200 MG tablet Take 200 mg by mouth 2 (two) times daily.    . MULTIPLE VITAMIN PO Take 1 tablet by mouth daily.      Marland Kitchen omeprazole (PRILOSEC) 40 MG capsule Take 1 capsule (40 mg total) by mouth daily before breakfast. 30 capsule 11  . Probiotic Product (PROBIOTIC DAILY PO) Take by mouth.    . triamcinolone cream (KENALOG) 0.1 %     . UNABLE TO FIND Med Name: study drug, not known what it is.    . vitamin C (ASCORBIC ACID) 500 MG tablet Take 500 mg by mouth daily.      Marland Kitchen zolpidem (AMBIEN) 5 MG tablet TAKE 1 TABLET BY MOUTH EVERY DAY AT BEDTIME AS NEEDED 30 tablet 3   No current facility-administered medications on file prior to visit.     Allergies: No Known Allergies  Review of Systems:  CONSTITUTIONAL: No fevers, chills, night sweats, or weight loss.  EYES: No visual changes or eye pain ENT: No hearing changes.  No history of nose bleeds.   RESPIRATORY: No cough, wheezing and shortness of breath.   CARDIOVASCULAR: Negative for chest pain, and palpitations.   GI: Negative for abdominal discomfort,  blood in stools or black stools.  No recent change in bowel habits.   GU:  No history of incontinence.   MUSCLOSKELETAL: No history of joint pain or swelling.  No myalgias.   SKIN: Negative for lesions, rash, and itching.   ENDOCRINE: Negative for cold or heat intolerance, polydipsia or goiter.   PSYCH:  No depression or anxiety symptoms.   NEURO: As Above.   Vital Signs:  BP 130/80   Pulse (!) 103   Ht 5' 2.5" (1.588 m)   Wt 137 lb (62.1 kg)   SpO2 95%   BMI 24.66 kg/m   Gen:  Well-appearing CV:  RRR.  No carotid bruit Ext:  No edema  Neurological Exam: MENTAL STATUS including orientation  to time, place, person, recent and remote memory, attention span and concentration, language, and fund of knowledge is normal.  Speech is not dysarthric.  CRANIAL NERVES: Pupils equal round and reactive to light.  Normal conjugate, extra-ocular eye movements in all directions of gaze.  Facial sensation is intact.  Face is symmetric. Palate elevates symmetrically.  Tongue is midline.  MOTOR:  Motor strength is 5/5 in all extremities.      MSRs:  Reflexes are 2+/4 throughout.  SENSORY: Reduced vibration, pin prick, and temperature at the ankles bilaterally  COORDINATION/GAIT:   Gait narrow based and stable.   Data: NCS/EMG of the lower extremities 03/07/2015:  This is a normal study. In particular, there is no evidence of a generalized sensorimotor polyneuropathy or left lumbosacral radiculopathy.  MRI Lumbar spine wwo contrast 12/27/2008: 1. Stable appearance of the marrow in the lumbar spine compared with prior study from 06/03/2008. Findings may reflect a normal variant, although myelofibrosis remains possible. Correlation with complete blood count is recommended. 2. No evidence of pathologic fracture or paraspinal abnormality. 3. No disc herniation, spinal stenosis or nerve root encroachment.  Labs 02/20/2015:  Vitamin B1 12, copper 94, zinc 59*, SPEP with IFE no M protein  IMPRESSION: 1.  Painful small fiber neuropathy, risk factors - alcohol and lupus  - Neuropathy labs normal  - NCS/EMG of the legs is normal  - Skin biopsy declined at this time, will reconsider if symptoms progress  - Adjust gabapentin to optimize the dose around her pain - she will start taking 1 tab in the morning and afternoon and 3 tablets at bedtime.  OK to take extra dose as needed  2.  Facial paresthesias following neck manipulation  - US carotids to be sure there is no dissection  - Start aspirin 81mg  daily for secondary stroke prevention.  She does not have diabetes, HTN, or hyperlipidemia..  Mother  had stroke in her 42s.    3.  Tobacco use disorder, encouraged to stop smoking  Return to clinic in 1 year   The duration of this appointment visit was 30 minutes of face-to-face time with the patient.  Greater than 50% of this time was spent in counseling, explanation of diagnosis, planning of further management, and coordination of care.   Thank you for allowing me to participate in patient's care.  If I can answer any additional questions, I would be pleased to do so.    Sincerely,    Donika K. Posey Pronto, DO

## 2016-06-18 ENCOUNTER — Telehealth: Payer: Self-pay | Admitting: *Deleted

## 2016-06-18 NOTE — Telephone Encounter (Signed)
Left message giving patient new instructions.

## 2016-06-18 NOTE — Telephone Encounter (Signed)
Let's go back to taking gabapentin 600mg  in the morning, 600mg  in the afternoon, and increase bedtime to 900mg .  Donika K. Posey Pronto, DO

## 2016-06-18 NOTE — Telephone Encounter (Signed)
Patient left a message stating that you adjusted her medication last visit and it is not helping.  (gabapentin)  She is now trying 1 in the am, 2 in the pm and 3 at bedtime.  She also said that she does not want to have th US done since the numbness has gone away.

## 2016-07-24 DIAGNOSIS — M329 Systemic lupus erythematosus, unspecified: Secondary | ICD-10-CM | POA: Diagnosis not present

## 2016-07-24 DIAGNOSIS — H3509 Other intraretinal microvascular abnormalities: Secondary | ICD-10-CM | POA: Diagnosis not present

## 2016-07-24 DIAGNOSIS — Z79899 Other long term (current) drug therapy: Secondary | ICD-10-CM | POA: Diagnosis not present

## 2016-07-24 DIAGNOSIS — H2513 Age-related nuclear cataract, bilateral: Secondary | ICD-10-CM | POA: Diagnosis not present

## 2016-07-29 ENCOUNTER — Other Ambulatory Visit: Payer: Self-pay | Admitting: Internal Medicine

## 2016-09-11 DIAGNOSIS — M9902 Segmental and somatic dysfunction of thoracic region: Secondary | ICD-10-CM | POA: Diagnosis not present

## 2016-09-11 DIAGNOSIS — M5032 Other cervical disc degeneration, mid-cervical region, unspecified level: Secondary | ICD-10-CM | POA: Diagnosis not present

## 2016-09-11 DIAGNOSIS — M9901 Segmental and somatic dysfunction of cervical region: Secondary | ICD-10-CM | POA: Diagnosis not present

## 2016-09-11 DIAGNOSIS — M5134 Other intervertebral disc degeneration, thoracic region: Secondary | ICD-10-CM | POA: Diagnosis not present

## 2016-09-11 DIAGNOSIS — M5136 Other intervertebral disc degeneration, lumbar region: Secondary | ICD-10-CM | POA: Diagnosis not present

## 2016-09-11 DIAGNOSIS — M9903 Segmental and somatic dysfunction of lumbar region: Secondary | ICD-10-CM | POA: Diagnosis not present

## 2016-09-25 DIAGNOSIS — M9903 Segmental and somatic dysfunction of lumbar region: Secondary | ICD-10-CM | POA: Diagnosis not present

## 2016-09-25 DIAGNOSIS — M9902 Segmental and somatic dysfunction of thoracic region: Secondary | ICD-10-CM | POA: Diagnosis not present

## 2016-09-25 DIAGNOSIS — M5032 Other cervical disc degeneration, mid-cervical region, unspecified level: Secondary | ICD-10-CM | POA: Diagnosis not present

## 2016-09-25 DIAGNOSIS — M5134 Other intervertebral disc degeneration, thoracic region: Secondary | ICD-10-CM | POA: Diagnosis not present

## 2016-09-25 DIAGNOSIS — M9901 Segmental and somatic dysfunction of cervical region: Secondary | ICD-10-CM | POA: Diagnosis not present

## 2016-09-25 DIAGNOSIS — M5136 Other intervertebral disc degeneration, lumbar region: Secondary | ICD-10-CM | POA: Diagnosis not present

## 2016-09-27 ENCOUNTER — Encounter: Payer: Self-pay | Admitting: Nurse Practitioner

## 2016-09-27 ENCOUNTER — Ambulatory Visit (INDEPENDENT_AMBULATORY_CARE_PROVIDER_SITE_OTHER): Payer: Medicare Other | Admitting: Nurse Practitioner

## 2016-09-27 ENCOUNTER — Ambulatory Visit: Payer: Medicare Other | Admitting: Internal Medicine

## 2016-09-27 VITALS — BP 122/80 | HR 97 | Temp 98.5°F | Ht 62.5 in | Wt 135.0 lb

## 2016-09-27 DIAGNOSIS — J209 Acute bronchitis, unspecified: Secondary | ICD-10-CM

## 2016-09-27 DIAGNOSIS — F17200 Nicotine dependence, unspecified, uncomplicated: Secondary | ICD-10-CM | POA: Diagnosis not present

## 2016-09-27 MED ORDER — METHYLPREDNISOLONE ACETATE 40 MG/ML IJ SUSP
40.0000 mg | Freq: Once | INTRAMUSCULAR | Status: AC
  Administered 2016-09-27: 40 mg via INTRAMUSCULAR

## 2016-09-27 MED ORDER — ALBUTEROL SULFATE HFA 108 (90 BASE) MCG/ACT IN AERS: 2.0000 | INHALATION_SPRAY | Freq: Four times a day (QID) | RESPIRATORY_TRACT | 0 refills | Status: DC | PRN

## 2016-09-27 MED ORDER — IPRATROPIUM-ALBUTEROL 0.5-2.5 (3) MG/3ML IN SOLN
3.0000 mL | Freq: Once | RESPIRATORY_TRACT | Status: AC
  Administered 2016-09-27: 3 mL via RESPIRATORY_TRACT

## 2016-09-27 MED ORDER — BENZONATATE 100 MG PO CAPS: 100.0000 mg | ORAL_CAPSULE | Freq: Three times a day (TID) | ORAL | 0 refills | Status: DC | PRN

## 2016-09-27 MED ORDER — GUAIFENESIN-DM 100-10 MG/5ML PO SYRP: 5.0000 mL | ORAL_SOLUTION | ORAL | 0 refills | Status: DC | PRN

## 2016-09-27 MED ORDER — AZITHROMYCIN 250 MG PO TABS: 250.0000 mg | ORAL_TABLET | Freq: Every day | ORAL | 0 refills | Status: DC

## 2016-09-27 NOTE — Progress Notes (Signed)
Subjective:  Patient ID: Sheila Morales, female    DOB: 03-Sep-1950  Age: 66 y.o. MRN: [id number]  CC: Cough (coughing,congestion 1 wk. chantix consult?)   Cough  This is a new problem. The current episode started in the past 7 days. The problem has been waxing and waning. The problem occurs constantly. The cough is productive of purulent sputum. Associated symptoms include ear congestion, shortness of breath and wheezing. Pertinent negatives include no nasal congestion, postnasal drip, rhinorrhea or sore throat. The symptoms are aggravated by lying down and cold air. She has tried OTC cough suppressant for the symptoms. The treatment provided no relief. Her past medical history is significant for COPD.   Outpatient Medications Prior to Visit  Medication Sig Dispense Refill  . cholecalciferol (VITAMIN D) 1000 UNITS tablet Take 1,000 Units by mouth daily.      . colestipol (COLESTID) 5 G granules 1 scoop alternating with 1/2 scoop 500 g 12  . gabapentin (NEURONTIN) 300 MG capsule Start taking 1 tab in the morning and afternoon and 3 tablets at bedtime.  OK to take extra dose as needed. 540 capsule 3  . hydroxychloroquine (PLAQUENIL) 200 MG tablet Take 200 mg by mouth 2 (two) times daily.    . MULTIPLE VITAMIN PO Take 1 tablet by mouth daily.      Marland Kitchen omeprazole (PRILOSEC) 40 MG capsule Take 1 capsule (40 mg total) by mouth daily before breakfast. 30 capsule 11  . Probiotic Product (PROBIOTIC DAILY PO) Take by mouth.    . triamcinolone cream (KENALOG) 0.1 %     . UNABLE TO FIND Med Name: study drug, not known what it is.    . vitamin C (ASCORBIC ACID) 500 MG tablet Take 500 mg by mouth daily.      Marland Kitchen zolpidem (AMBIEN) 5 MG tablet TAKE 1 TABLET BY MOUTH AT BEDTIME AS NEEDED 30 tablet 3  . HYDROcodone-homatropine (HYCODAN) 5-1.5 MG/5ML syrup Take 5 mLs by mouth every 8 (eight) hours as needed for cough. (Patient not taking: Reported on 09/27/2016) 120 mL 0   No facility-administered medications prior  to visit.     ROS See HPI  Objective:  BP 122/80   Pulse 97   Temp 98.5 F (36.9 C)   Ht 5' 2.5" (1.588 m)   Wt 135 lb (61.2 kg)   SpO2 98%   BMI 24.30 kg/m   BP Readings from Last 3 Encounters:  09/27/16 122/80  06/13/16 130/80  05/07/16 130/70    Wt Readings from Last 3 Encounters:  09/27/16 135 lb (61.2 kg)  06/13/16 137 lb (62.1 kg)  05/07/16 141 lb (64 kg)    Physical Exam  Constitutional: She is oriented to person, place, and time. No distress.  Neck: Normal range of motion. Neck supple.  Cardiovascular: Normal rate.   Pulmonary/Chest: Effort normal. No respiratory distress. She has wheezes.  Musculoskeletal: She exhibits no edema.  Neurological: She is alert and oriented to person, place, and time.  Skin: Skin is warm and dry.  Vitals reviewed.   Lab Results  Component Value Date   WBC 5.9 05/24/2014   HGB 14.1 05/24/2014   HCT 40.5 05/24/2014   PLT 200.0 05/24/2014   GLUCOSE 85 05/24/2014   CHOL 152 11/21/2006   TRIG 82 11/21/2006   HDL 51.7 11/21/2006   LDLCALC 84 11/21/2006   ALT 23 05/24/2014   AST 27 05/24/2014   NA 139 05/24/2014   K 4.4 05/24/2014   CL 104 05/24/2014  CREATININE 0.66 05/24/2014   BUN 14 05/24/2014   CO2 33 (H) 05/24/2014   TSH 1.10 01/24/2015    Mm Digital Screening Bilateral  Result Date: 02/27/2016 CLINICAL DATA:  Screening. EXAM: DIGITAL SCREENING BILATERAL MAMMOGRAM WITH CAD COMPARISON:  Previous exam(s). ACR Breast Density Category b: There are scattered areas of fibroglandular density. FINDINGS: There are no findings suspicious for malignancy. Images were processed with CAD. IMPRESSION: No mammographic evidence of malignancy. A result letter of this screening mammogram will be mailed directly to the patient. RECOMMENDATION: Screening mammogram in one year. (Code:SM-B-01Y) BI-RADS CATEGORY  1: Negative. Electronically Signed   By: Everlean Alstrom M.D.   On: 02/29/2016 14:08    Assessment & Plan:   Sheila Morales was  seen today for cough.  Diagnoses and all orders for this visit:  Acute bronchitis, unspecified organism -     ipratropium-albuterol (DUONEB) 0.5-2.5 (3) MG/3ML nebulizer solution 3 mL; Take 3 mLs by nebulization once. -     albuterol (PROVENTIL HFA;VENTOLIN HFA) 108 (90 Base) MCG/ACT inhaler; Inhale 2 puffs into the lungs every 6 (six) hours as needed for wheezing or shortness of breath. -     guaiFENesin-dextromethorphan (ROBITUSSIN DM) 100-10 MG/5ML syrup; Take 5 mLs by mouth every 4 (four) hours as needed for cough. -     azithromycin (ZITHROMAX Z-PAK) 250 MG tablet; Take 1 tablet (250 mg total) by mouth daily. Take 2tabs on first day, then 1tab once a day till complete -     benzonatate (TESSALON) 100 MG capsule; Take 1 capsule (100 mg total) by mouth 3 (three) times daily as needed for cough. -     methylPREDNISolone acetate (DEPO-MEDROL) injection 40 mg; Inject 1 mL (40 mg total) into the muscle once.  TOBACCO USE DISORDER/SMOKER-SMOKING CESSATION DISCUSSED   I have discontinued [name] HYDROcodone-homatropine. I am also having her start on albuterol, guaiFENesin-dextromethorphan, azithromycin, and benzonatate. Additionally, I am having her maintain her MULTIPLE VITAMIN PO, vitamin C, cholecalciferol, hydroxychloroquine, Probiotic Product (PROBIOTIC DAILY PO), omeprazole, colestipol, triamcinolone cream, UNABLE TO FIND, gabapentin, and zolpidem. We administered ipratropium-albuterol. We will continue to administer methylPREDNISolone acetate.  Meds ordered this encounter  Medications  . ipratropium-albuterol (DUONEB) 0.5-2.5 (3) MG/3ML nebulizer solution 3 mL  . albuterol (PROVENTIL HFA;VENTOLIN HFA) 108 (90 Base) MCG/ACT inhaler    Sig: Inhale 2 puffs into the lungs every 6 (six) hours as needed for wheezing or shortness of breath.    Dispense:  1 Inhaler    Refill:  0    Order Specific Question:   Supervising Provider    Answer:   Cassandria Anger [[id number]]  .  guaiFENesin-dextromethorphan (ROBITUSSIN DM) 100-10 MG/5ML syrup    Sig: Take 5 mLs by mouth every 4 (four) hours as needed for cough.    Dispense:  118 mL    Refill:  0    Order Specific Question:   Supervising Provider    Answer:   Cassandria Anger [[id number]]  . azithromycin (ZITHROMAX Z-PAK) 250 MG tablet    Sig: Take 1 tablet (250 mg total) by mouth daily. Take 2tabs on first day, then 1tab once a day till complete    Dispense:  6 tablet    Refill:  0    Order Specific Question:   Supervising Provider    Answer:   Cassandria Anger [[id number]]  . benzonatate (TESSALON) 100 MG capsule    Sig: Take 1 capsule (100 mg total) by mouth 3 (three) times daily as needed for cough.  Dispense:  20 capsule    Refill:  0    Order Specific Question:   Supervising Provider    Answer:   Cassandria Anger [[id number]]  . methylPREDNISolone acetate (DEPO-MEDROL) injection 40 mg    Follow-up: Return if symptoms worsen or fail to improve.  Wilfred Lacy, NP

## 2016-09-27 NOTE — Patient Instructions (Addendum)
She does not want chantix prescription at this time.  Acute Bronchitis, Adult Acute bronchitis is sudden (acute) swelling of the air tubes (bronchi) in the lungs. Acute bronchitis causes these tubes to fill with mucus, which can make it hard to breathe. It can also cause coughing or wheezing. In adults, acute bronchitis usually goes away within 2 weeks. A cough caused by bronchitis may last up to 3 weeks. Smoking, allergies, and asthma can make the condition worse. Repeated episodes of bronchitis may cause further lung problems, such as chronic obstructive pulmonary disease (COPD). What are the causes? This condition can be caused by germs and by substances that irritate the lungs, including:  Cold and flu viruses. This condition is most often caused by the same virus that causes a cold.  Bacteria.  Exposure to tobacco smoke, dust, fumes, and air pollution.  What increases the risk? This condition is more likely to develop in people who:  Have close contact with someone with acute bronchitis.  Are exposed to lung irritants, such as tobacco smoke, dust, fumes, and vapors.  Have a weak immune system.  Have a respiratory condition such as asthma.  What are the signs or symptoms? Symptoms of this condition include:  A cough.  Coughing up clear, yellow, or green mucus.  Wheezing.  Chest congestion.  Shortness of breath.  A fever.  Body aches.  Chills.  A sore throat.  How is this diagnosed? This condition is usually diagnosed with a physical exam. During the exam, your health care provider may order tests, such as chest X-rays, to rule out other conditions. He or she may also:  Test a sample of your mucus for bacterial infection.  Check the level of oxygen in your blood. This is done to check for pneumonia.  Do a chest X-ray or lung function testing to rule out pneumonia and other conditions.  Perform blood tests.  Your health care provider will also ask about your  symptoms and medical history. How is this treated? Most cases of acute bronchitis clear up over time without treatment. Your health care provider may recommend:  Drinking more fluids. Drinking more makes your mucus thinner, which may make it easier to breathe.  Taking a medicine for a fever or cough.  Taking an antibiotic medicine.  Using an inhaler to help improve shortness of breath and to control a cough.  Using a cool mist vaporizer or humidifier to make it easier to breathe.  Follow these instructions at home: Medicines  Take over-the-counter and prescription medicines only as told by your health care provider.  If you were prescribed an antibiotic, take it as told by your health care provider. Do not stop taking the antibiotic even if you start to feel better. General instructions  Get plenty of rest.  Drink enough fluids to keep your urine clear or pale yellow.  Avoid smoking and secondhand smoke. Exposure to cigarette smoke or irritating chemicals will make bronchitis worse. If you smoke and you need help quitting, ask your health care provider. Quitting smoking will help your lungs heal faster.  Use an inhaler, cool mist vaporizer, or humidifier as told by your health care provider.  Keep all follow-up visits as told by your health care provider. This is important. How is this prevented? To lower your risk of getting this condition again:  Wash your hands often with soap and water. If soap and water are not available, use hand sanitizer.  Avoid contact with people who have cold  symptoms.  Try not to touch your hands to your mouth, nose, or eyes.  Make sure to get the flu shot every year.  Contact a health care provider if:  Your symptoms do not improve in 2 weeks of treatment. Get help right away if:  You cough up blood.  You have chest pain.  You have severe shortness of breath.  You become dehydrated.  You faint or keep feeling like you are going to  faint.  You keep vomiting.  You have a severe headache.  Your fever or chills gets worse. This information is not intended to replace advice given to you by your health care provider. Make sure you discuss any questions you have with your health care provider. Document Released: 05/16/2004 Document Revised: 11/01/2015 Document Reviewed: 09/27/2015 Elsevier Interactive Patient Education  2017 [employer].

## 2016-10-02 DIAGNOSIS — M9903 Segmental and somatic dysfunction of lumbar region: Secondary | ICD-10-CM | POA: Diagnosis not present

## 2016-10-02 DIAGNOSIS — M5136 Other intervertebral disc degeneration, lumbar region: Secondary | ICD-10-CM | POA: Diagnosis not present

## 2016-10-02 DIAGNOSIS — M5032 Other cervical disc degeneration, mid-cervical region, unspecified level: Secondary | ICD-10-CM | POA: Diagnosis not present

## 2016-10-02 DIAGNOSIS — M9901 Segmental and somatic dysfunction of cervical region: Secondary | ICD-10-CM | POA: Diagnosis not present

## 2016-10-02 DIAGNOSIS — M5134 Other intervertebral disc degeneration, thoracic region: Secondary | ICD-10-CM | POA: Diagnosis not present

## 2016-10-02 DIAGNOSIS — M9902 Segmental and somatic dysfunction of thoracic region: Secondary | ICD-10-CM | POA: Diagnosis not present

## 2016-10-03 ENCOUNTER — Telehealth: Payer: Self-pay | Admitting: *Deleted

## 2016-10-03 DIAGNOSIS — J209 Acute bronchitis, unspecified: Secondary | ICD-10-CM

## 2016-10-03 NOTE — Telephone Encounter (Signed)
Rec'd call pt states she saw Baldo Ash last week was dx w/bronchitis. She gave her a zpac which she completed on Monday. Pt states she still have the same symptom constantly coughing. Coughing up phlegm but it is clear. Nasal congestion. Pt is wanting to see if she can get a refill on the zpac, and can she have cough syrup rx because the tessalon pearles does not work for her at all [place] is out of the office pls advise...[name]

## 2016-10-04 ENCOUNTER — Other Ambulatory Visit: Payer: Self-pay | Admitting: Nurse Practitioner

## 2016-10-04 DIAGNOSIS — J209 Acute bronchitis, unspecified: Secondary | ICD-10-CM

## 2016-10-04 MED ORDER — HYDROCOD POLST-CPM POLST ER 10-8 MG/5ML PO SUER: 5.0000 mL | Freq: Two times a day (BID) | ORAL | 0 refills | Status: DC | PRN

## 2016-10-04 MED ORDER — IPRATROPIUM BROMIDE 0.03 % NA SOLN: 2.0000 | Freq: Two times a day (BID) | NASAL | 0 refills | Status: DC

## 2016-10-04 NOTE — Telephone Encounter (Signed)
Pt called checking on this. Please advise.  °

## 2016-10-04 NOTE — Telephone Encounter (Signed)
[facility] called stating that this script can not be faxed. A hard copy would need to be taken to the pharmacy or sent electronically if we are able to.

## 2016-10-04 NOTE — Telephone Encounter (Signed)
Pt verbalized understand. rx sent to [facility].

## 2016-10-04 NOTE — Telephone Encounter (Signed)
RX placed up front for pt to pick up.

## 2016-10-08 DIAGNOSIS — Z79899 Other long term (current) drug therapy: Secondary | ICD-10-CM | POA: Diagnosis not present

## 2016-10-08 DIAGNOSIS — L932 Other local lupus erythematosus: Secondary | ICD-10-CM | POA: Diagnosis not present

## 2016-10-08 DIAGNOSIS — M328 Other forms of systemic lupus erythematosus: Secondary | ICD-10-CM | POA: Diagnosis not present

## 2016-10-08 DIAGNOSIS — Z6824 Body mass index (BMI) 24.0-24.9, adult: Secondary | ICD-10-CM | POA: Diagnosis not present

## 2016-10-21 ENCOUNTER — Telehealth: Payer: Self-pay | Admitting: Nurse Practitioner

## 2016-10-21 DIAGNOSIS — J209 Acute bronchitis, unspecified: Secondary | ICD-10-CM

## 2016-10-21 MED ORDER — HYDROCOD POLST-CPM POLST ER 10-8 MG/5ML PO SUER: 5.0000 mL | Freq: Two times a day (BID) | ORAL | 0 refills | Status: DC | PRN

## 2016-10-21 NOTE — Telephone Encounter (Signed)
Pt is here and voiced needing prescription for:   chlorpheniramine-HYDROcodone (TUSSIONEX PENNKINETIC ER) 10-8 MG/5ML SUER Take 5 mLs by mouth every 12 (twelve) hours as needed for cough and the same pharmacist listed [facility] [address]

## 2016-10-21 NOTE — Telephone Encounter (Signed)
rx placed at front desk, patient advised to pick up

## 2016-10-21 NOTE — Telephone Encounter (Signed)
Will renew. rx printed.  If symptoms do not improve she should be re-evaluated

## 2016-10-21 NOTE — Telephone Encounter (Signed)
Please see below had to add contact number listed

## 2016-10-21 NOTE — Telephone Encounter (Signed)
Routing to dr burns, please advise in the absence of dr crawford----I will call patient back, thanks

## 2016-10-30 DIAGNOSIS — H2511 Age-related nuclear cataract, right eye: Secondary | ICD-10-CM | POA: Diagnosis not present

## 2016-10-30 DIAGNOSIS — H16221 Keratoconjunctivitis sicca, not specified as Sjogren's, right eye: Secondary | ICD-10-CM | POA: Diagnosis not present

## 2016-10-30 DIAGNOSIS — H25011 Cortical age-related cataract, right eye: Secondary | ICD-10-CM | POA: Diagnosis not present

## 2016-12-03 ENCOUNTER — Ambulatory Visit (INDEPENDENT_AMBULATORY_CARE_PROVIDER_SITE_OTHER): Payer: Medicare Other | Admitting: Nurse Practitioner

## 2016-12-03 ENCOUNTER — Encounter: Payer: Self-pay | Admitting: Nurse Practitioner

## 2016-12-03 VITALS — BP 138/70 | HR 88 | Temp 99.2°F | Ht 62.5 in | Wt 137.0 lb

## 2016-12-03 DIAGNOSIS — R938 Abnormal findings on diagnostic imaging of other specified body structures: Secondary | ICD-10-CM | POA: Diagnosis not present

## 2016-12-03 DIAGNOSIS — R0602 Shortness of breath: Secondary | ICD-10-CM | POA: Diagnosis not present

## 2016-12-03 DIAGNOSIS — R05 Cough: Secondary | ICD-10-CM

## 2016-12-03 DIAGNOSIS — R059 Cough, unspecified: Secondary | ICD-10-CM

## 2016-12-03 DIAGNOSIS — F17200 Nicotine dependence, unspecified, uncomplicated: Secondary | ICD-10-CM

## 2016-12-03 DIAGNOSIS — R9389 Abnormal findings on diagnostic imaging of other specified body structures: Secondary | ICD-10-CM

## 2016-12-03 MED ORDER — FLUTICASONE-UMECLIDIN-VILANT 100-62.5-25 MCG/INH IN AEPB: 1.0000 "application " | INHALATION_SPRAY | Freq: Every day | RESPIRATORY_TRACT | 0 refills | Status: DC

## 2016-12-03 NOTE — Progress Notes (Signed)
Subjective:  Patient ID: Sheila Morales, female    DOB: 1950/06/03  Age: 66 y.o. MRN: [id number]  CC: Cough (still has coughing,has to clear throat at times,using spray. )   Cough  This is a recurrent problem. The current episode started more than 1 month ago. The problem has been waxing and waning. The cough is non-productive. Associated symptoms include shortness of breath and wheezing. Pertinent negatives include no chest pain, chills, fever, hemoptysis, nasal congestion, postnasal drip or rhinorrhea. Risk factors for lung disease include smoking/tobacco exposure. She has tried a beta-agonist inhaler, prescription cough suppressant and OTC cough suppressant for the symptoms. The treatment provided moderate relief. Her past medical history is significant for COPD.   Waxing and waning since 09/2016)  Persistent tobacco use. GERD symptoms controlled with omeprazole 40mg  daily. Recent CXR indicates chronic interstitial disease.  Outpatient Medications Prior to Visit  Medication Sig Dispense Refill  . cholecalciferol (VITAMIN D) 1000 UNITS tablet Take 1,000 Units by mouth daily.      . colestipol (COLESTID) 5 G granules 1 scoop alternating with 1/2 scoop 500 g 12  . gabapentin (NEURONTIN) 300 MG capsule Start taking 1 tab in the morning and afternoon and 3 tablets at bedtime.  OK to take extra dose as needed. 540 capsule 3  . guaiFENesin-dextromethorphan (ROBITUSSIN DM) 100-10 MG/5ML syrup Take 5 mLs by mouth every 4 (four) hours as needed for cough. 118 mL 0  . hydroxychloroquine (PLAQUENIL) 200 MG tablet Take 200 mg by mouth 2 (two) times daily.    . MULTIPLE VITAMIN PO Take 1 tablet by mouth daily.      Marland Kitchen omeprazole (PRILOSEC) 40 MG capsule Take 1 capsule (40 mg total) by mouth daily before breakfast. 30 capsule 11  . Probiotic Product (PROBIOTIC DAILY PO) Take by mouth.    . triamcinolone cream (KENALOG) 0.1 %     . UNABLE TO FIND Med Name: study drug, not known what it is.    . vitamin C  (ASCORBIC ACID) 500 MG tablet Take 500 mg by mouth daily.      Marland Kitchen zolpidem (AMBIEN) 5 MG tablet TAKE 1 TABLET BY MOUTH AT BEDTIME AS NEEDED 30 tablet 3  . chlorpheniramine-HYDROcodone (TUSSIONEX PENNKINETIC ER) 10-8 MG/5ML SUER Take 5 mLs by mouth every 12 (twelve) hours as needed for cough. 115 mL 0  . ipratropium (ATROVENT) 0.03 % nasal spray Place 2 sprays into both nostrils 2 (two) times daily. Do not use for more than 5days. 30 mL 0  . albuterol (PROVENTIL HFA;VENTOLIN HFA) 108 (90 Base) MCG/ACT inhaler Inhale 2 puffs into the lungs every 6 (six) hours as needed for wheezing or shortness of breath. (Patient not taking: Reported on 12/03/2016) 1 Inhaler 0   No facility-administered medications prior to visit.     ROS See HPI  Objective:  BP 138/70   Pulse 88   Temp 99.2 F (37.3 C)   Ht 5' 2.5" (1.588 m)   Wt 137 lb (62.1 kg)   SpO2 96%   BMI 24.66 kg/m   BP Readings from Last 3 Encounters:  12/03/16 138/70  09/27/16 122/80  06/13/16 130/80    Wt Readings from Last 3 Encounters:  12/03/16 137 lb (62.1 kg)  09/27/16 135 lb (61.2 kg)  06/13/16 137 lb (62.1 kg)    Physical Exam  Constitutional: She is oriented to person, place, and time. No distress.  Cardiovascular: Normal rate and normal heart sounds.   Pulmonary/Chest: Effort normal. No respiratory distress.  She has no wheezes. She has no rales.  Diminished lung sounds in bases  Musculoskeletal: She exhibits no edema.  Neurological: She is alert and oriented to person, place, and time.  Vitals reviewed.   Lab Results  Component Value Date   WBC 5.9 05/24/2014   HGB 14.1 05/24/2014   HCT 40.5 05/24/2014   PLT 200.0 05/24/2014   GLUCOSE 85 05/24/2014   CHOL 152 11/21/2006   TRIG 82 11/21/2006   HDL 51.7 11/21/2006   LDLCALC 84 11/21/2006   ALT 23 05/24/2014   AST 27 05/24/2014   NA 139 05/24/2014   K 4.4 05/24/2014   CL 104 05/24/2014   CREATININE 0.66 05/24/2014   BUN 14 05/24/2014   CO2 33 (H)  05/24/2014   TSH 1.10 01/24/2015    Mm Digital Screening Bilateral  Result Date: 02/27/2016 CLINICAL DATA:  Screening. EXAM: DIGITAL SCREENING BILATERAL MAMMOGRAM WITH CAD COMPARISON:  Previous exam(s). ACR Breast Density Category b: There are scattered areas of fibroglandular density. FINDINGS: There are no findings suspicious for malignancy. Images were processed with CAD. IMPRESSION: No mammographic evidence of malignancy. A result letter of this screening mammogram will be mailed directly to the patient. RECOMMENDATION: Screening mammogram in one year. (Code:SM-B-01Y) BI-RADS CATEGORY  1: Negative. Electronically Signed   By: Everlean Alstrom M.D.   On: 02/29/2016 14:08    Assessment & Plan:   Nyaja was seen today for cough.  Diagnoses and all orders for this visit:  Cough -     Cancel: POCT EXHALED NITRIC OXIDE -     Ambulatory referral to Pulmonology -     Fluticasone-Umeclidin-Vilant (TRELEGY ELLIPTA) 100-62.5-25 MCG/INH AEPB; Inhale 1 application into the lungs daily. Rinse mouth after use  SOB (shortness of breath) on exertion -     Ambulatory referral to Pulmonology -     Fluticasone-Umeclidin-Vilant (TRELEGY ELLIPTA) 100-62.5-25 MCG/INH AEPB; Inhale 1 application into the lungs daily. Rinse mouth after use  Abnormal CXR (chest x-ray) -     Ambulatory referral to Pulmonology -     Fluticasone-Umeclidin-Vilant (TRELEGY ELLIPTA) 100-62.5-25 MCG/INH AEPB; Inhale 1 application into the lungs daily. Rinse mouth after use  Tobacco dependence -     Ambulatory referral to Pulmonology -     Fluticasone-Umeclidin-Vilant (TRELEGY ELLIPTA) 100-62.5-25 MCG/INH AEPB; Inhale 1 application into the lungs daily. Rinse mouth after use   I have discontinued Ms. Jeanmarie ipratropium and chlorpheniramine-HYDROcodone. I am also having her start on Fluticasone-Umeclidin-Vilant. Additionally, I am having her maintain her MULTIPLE VITAMIN PO, vitamin C, cholecalciferol, hydroxychloroquine, Probiotic  Product (PROBIOTIC DAILY PO), omeprazole, colestipol, triamcinolone cream, UNABLE TO FIND, gabapentin, zolpidem, albuterol, guaiFENesin-dextromethorphan, and aspirin.  Meds ordered this encounter  Medications  . aspirin 81 MG tablet    Sig: Take 81 mg by mouth daily.  . Fluticasone-Umeclidin-Vilant (TRELEGY ELLIPTA) 100-62.5-25 MCG/INH AEPB    Sig: Inhale 1 application into the lungs daily. Rinse mouth after use    Dispense:  28 each    Refill:  0    Order Specific Question:   Supervising Provider    Answer:   Cassandria Anger [[id number]]    Follow-up: Return if symptoms worsen or fail to improve.  Wilfred Lacy, NP

## 2016-12-03 NOTE — Patient Instructions (Signed)
You will be contacted to schedule appt with pulmonology.  Continue use of albuterol and robitussin as prescribed  Start Trelegy as prescribed.

## 2016-12-10 ENCOUNTER — Telehealth: Payer: Self-pay | Admitting: Internal Medicine

## 2016-12-10 NOTE — Telephone Encounter (Signed)
Check [facility] registry last filled 11/06/2016...[name]

## 2016-12-11 NOTE — Telephone Encounter (Signed)
Rx faxed

## 2016-12-16 NOTE — Telephone Encounter (Signed)
Rx called in to pharmacy. 

## 2016-12-16 NOTE — Telephone Encounter (Signed)
Pt called in and said that pharmacy has not rec'd this script .  Can it be refaxed?

## 2016-12-25 ENCOUNTER — Telehealth: Payer: Self-pay | Admitting: Neurology

## 2016-12-25 NOTE — Telephone Encounter (Signed)
She is wondering if she needs to see [place] or Dr. Trudie Reed.  Please advise. (she has just recently started back walking on the treadmill and is hoping this will help also)

## 2016-12-25 NOTE — Telephone Encounter (Signed)
Pt has been very tired and feels like her legs are going to give out from under her.  She is wondering if this is from the nerve problem in her legs.  Please call and advise.

## 2016-12-26 NOTE — Telephone Encounter (Signed)
Patient instructed to come in on December 19 at 7:45.

## 2016-12-26 NOTE — Telephone Encounter (Signed)
She should come into to see me to be evaluated.

## 2017-01-07 ENCOUNTER — Telehealth: Payer: Self-pay | Admitting: Neurology

## 2017-01-07 NOTE — Telephone Encounter (Signed)
Patient will come in on October 2 at 3:30.  Also gave her instructions per Dr. Posey Pronto.

## 2017-01-07 NOTE — Telephone Encounter (Signed)
She is scheduled to see [place] in December.  Can we maybe offer PT?

## 2017-01-07 NOTE — Telephone Encounter (Signed)
This is a new problem and I have not seen her for this in the past. Prior to offering any management recommendations, it is best for her to be seen in the clinic to be evaluated for this. We can offer her sooner appointment on October 2 at 3:30pm; if pain is severe, she may want to see urgent care/PCP who may have availability sooner.  Ravon Mortellaro K. Posey Pronto, DO

## 2017-01-07 NOTE — Telephone Encounter (Signed)
Her lower back has been hurting lately and she is not sure if that has anything to do with her leg problem. Her legs have been doing better since being on the treadmill. Please Advise. Thanks

## 2017-01-08 DIAGNOSIS — H16223 Keratoconjunctivitis sicca, not specified as Sjogren's, bilateral: Secondary | ICD-10-CM | POA: Diagnosis not present

## 2017-01-21 ENCOUNTER — Encounter: Payer: Self-pay | Admitting: Neurology

## 2017-01-21 ENCOUNTER — Ambulatory Visit (INDEPENDENT_AMBULATORY_CARE_PROVIDER_SITE_OTHER): Payer: Medicare Other | Admitting: Neurology

## 2017-01-21 VITALS — BP 110/80 | HR 94 | Ht 62.5 in | Wt 137.0 lb

## 2017-01-21 DIAGNOSIS — M545 Low back pain: Secondary | ICD-10-CM | POA: Diagnosis not present

## 2017-01-21 DIAGNOSIS — G621 Alcoholic polyneuropathy: Secondary | ICD-10-CM | POA: Diagnosis not present

## 2017-01-21 DIAGNOSIS — Z720 Tobacco use: Secondary | ICD-10-CM

## 2017-01-21 DIAGNOSIS — F17210 Nicotine dependence, cigarettes, uncomplicated: Secondary | ICD-10-CM | POA: Diagnosis not present

## 2017-01-21 DIAGNOSIS — G8929 Other chronic pain: Secondary | ICD-10-CM

## 2017-01-21 NOTE — Patient Instructions (Addendum)
1.  Great work in cutting back in alcohol and smoking, try to quit! 2.  Call my office if you would like to start physical therapy  Return to clinic in 1 year

## 2017-01-21 NOTE — Progress Notes (Signed)
Follow-up Visit   Date: 01/21/17    Sheila Morales MRN: [id number] DOB: 27-Jan-1951   Interim History: Sheila Morales is a 66 y.o. right-handed Caucasian female with GERD, lupus, irritable bowel syndrome, and tobacco use returning to the clinic with new complaints of low back pain.  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  The patient was accompanied to the clinic by self.  History of present illness: Starting in 2012, she developing tingling sensation of the feet which was restricted to below the ankles for the first several years. In 2016, it started progressing up her legs, to the level of the knees. Tingling is worse at night and keeps her up from sleeping. She does not paresthesias involving the hands. Denies any problems with balance or any falls this year. She has not tried any medication or seen a neurologist previously.   She drinks 2 glasses of wine nightly for the past two year and can drink more on the weekend. No history of diabetes or thyroid disease. Her lupus was well controlled for 20+ years, but this year she began have flares and skin lesions.  UPDATE 06/13/2016:  She is here for annual follow-up appointment.  She reports that her pain is well controlled on gabapentin 600mg  TID, but sometimes can be worse in the evening.  During the day, she is so busy and barely even notices any discomfort.  She denies any imbalance, weakness, or falls.  She reports having massage therapy and neck manipulation yesterday and since developed numbness and tingling of the cheeks, tongue, and jaw, worse on the left.  Symptoms are slowly improving.  No paresthesias of the arms or legs. No changes in swallowing, talking, or taste.    UPDATE 01/21/2017:   Starting around September, she began having heavy sensation of the legs which got worse after she stopped her exercise.  She continues to have intermittent low back pain.  In the morning, she has stiffness of the low back region which  is improved after she gets moving.  Since restarting her exercises and walking regimen, her pain is much better and legs to do not feel weak.  She has baseline numbness/tingling of the legs, which is well-controlled on gabapentin 600-600-900mg .  She has tried to cut back on smoking (0.25 PPD) and alcohol down to half-glass of wine nightly.   Medications:  Current Outpatient Prescriptions on File Prior to Visit  Medication Sig Dispense Refill  . albuterol (PROVENTIL HFA;VENTOLIN HFA) 108 (90 Base) MCG/ACT inhaler Inhale 2 puffs into the lungs every 6 (six) hours as needed for wheezing or shortness of breath. 1 Inhaler 0  . aspirin 81 MG tablet Take 81 mg by mouth daily.    . cholecalciferol (VITAMIN D) 1000 UNITS tablet Take 1,000 Units by mouth daily.      . colestipol (COLESTID) 5 G granules 1 scoop alternating with 1/2 scoop 500 g 12  . Fluticasone-Umeclidin-Vilant (TRELEGY ELLIPTA) 100-62.5-25 MCG/INH AEPB Inhale 1 application into the lungs daily. Rinse mouth after use 28 each 0  . gabapentin (NEURONTIN) 300 MG capsule Start taking 1 tab in the morning and afternoon and 3 tablets at bedtime.  OK to take extra dose as needed. 540 capsule 3  . guaiFENesin-dextromethorphan (ROBITUSSIN DM) 100-10 MG/5ML syrup Take 5 mLs by mouth every 4 (four) hours as needed for cough. 118 mL 0  . hydroxychloroquine (PLAQUENIL) 200 MG tablet Take 200 mg by mouth 2 (two) times daily.    . MULTIPLE  VITAMIN PO Take 1 tablet by mouth daily.      Marland Kitchen omeprazole (PRILOSEC) 40 MG capsule Take 1 capsule (40 mg total) by mouth daily before breakfast. 30 capsule 11  . Probiotic Product (PROBIOTIC DAILY PO) Take by mouth.    . triamcinolone cream (KENALOG) 0.1 %     . UNABLE TO FIND Med Name: study drug, not known what it is.    . vitamin C (ASCORBIC ACID) 500 MG tablet Take 500 mg by mouth daily.      Marland Kitchen zolpidem (AMBIEN) 5 MG tablet TAKE 1 TABLET BY MOUTH AT BEDTIME AS NEEDED 30 tablet 1   No current facility-administered  medications on file prior to visit.     Allergies: No Known Allergies  Review of Systems:  CONSTITUTIONAL: No fevers, chills, night sweats, or weight loss.  EYES: No visual changes or eye pain ENT: No hearing changes.  No history of nose bleeds.   RESPIRATORY: No cough, wheezing and shortness of breath.   CARDIOVASCULAR: Negative for chest pain, and palpitations.   GI: Negative for abdominal discomfort, blood in stools or black stools.  No recent change in bowel habits.   GU:  No history of incontinence.   MUSCLOSKELETAL: No history of joint pain or swelling.  No myalgias.   SKIN: Negative for lesions, rash, and itching.   ENDOCRINE: Negative for cold or heat intolerance, polydipsia or goiter.   PSYCH:  No depression or anxiety symptoms.   NEURO: As Above.   Vital Signs:  BP 110/80   Pulse 94   Ht 5' 2.5" (1.588 m)   Wt 137 lb (62.1 kg)   SpO2 97%   BMI 24.66 kg/m   Gen:  Well appearing, comfortable  Neurological Exam: MENTAL STATUS including orientation to time, place, person, recent and remote memory, attention span and concentration, language, and fund of knowledge is normal.  Speech is not dysarthric.  CRANIAL NERVES:   Face is symmetric.   MOTOR:  Motor strength is 5/5 in all extremities  MSRs:  Reflexes are 2+/4 throughout.  SENSORY:  Intact to vibration throughout.  COORDINATION/GAIT:   Gait narrow based and stable.     Data: NCS/EMG of the lower extremities 03/07/2015:  This is a normal study. In particular, there is no evidence of a generalized sensorimotor polyneuropathy or left lumbosacral radiculopathy.  MRI Lumbar spine wwo contrast 12/27/2008: 1. Stable appearance of the marrow in the lumbar spine compared with prior study from 06/03/2008. Findings may reflect a normal variant, although myelofibrosis remains possible. Correlation with complete blood count is recommended. 2. No evidence of pathologic fracture or paraspinal abnormality. 3. No disc  herniation, spinal stenosis or nerve root encroachment.  MRI lumbar spine wo conrast 03/18/2015:  Heterogeneous marrow signal is unchanged in appearance since 2010.  Mild spondylosis without central canal or foraminal narrowing.  Labs 02/20/2015:  Vitamin B1 12, copper 94, zinc 59*, SPEP with IFE no M protein  IMPRESSION: 1.  Chronic low back pain.  MRI lumbar spine reviewed from 2016, no evidence of nerve impingement and her history does not suggest radicular pain.  She has marked benefit with exercise, specifically, walking, which I encouraged.  Pain is well controlled with Aleve, which she takes sporadically only when severe.  Muscle relantants and formal PT for low back strengthening declined.  2.  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  No large fiber neuropathy on EMG.  She reports trying to reduce alcohol intake, now drinking half-glass wine  nightly and extra on the weekends, I encouraged her to cut back and stop to avoid progression of neuropathy.  Continue gabapentin 600mg  in the morning and afternoon and 900mg  at bedtime  2.  Facial paresthesias - resolved  3.  Tobacco cessation   - Currently smoking 0.25 packs/day    - Patient was informed of the dangers of tobacco abuse including stroke, cancer, and MI, as well as benefits of tobacco cessation.   - Patient is not willing to quit at this time.   - Approximately 7 mins were spent counseling patient cessation techniques. We discussed various methods to help quit smoking, including deciding on a date to quit, joining a support group, pharmacological agents- nicotine gum/patch/lozenges, chantix,   - I will reassess his progress at his next follow-up visit  Return to clinic in 1 year   Thank you for allowing me to participate in patient's care.  If I can answer any additional questions, I would be pleased to do so.    Sincerely,    Sydell Prowell K. Posey Pronto, DO

## 2017-01-22 ENCOUNTER — Encounter: Payer: Self-pay | Admitting: Emergency Medicine

## 2017-01-22 ENCOUNTER — Ambulatory Visit (INDEPENDENT_AMBULATORY_CARE_PROVIDER_SITE_OTHER): Payer: Medicare Other | Admitting: Emergency Medicine

## 2017-01-22 ENCOUNTER — Ambulatory Visit (INDEPENDENT_AMBULATORY_CARE_PROVIDER_SITE_OTHER)
Admission: RE | Admit: 2017-01-22 | Discharge: 2017-01-22 | Disposition: A | Discharge status: Home / Self Care | Payer: Medicare Other | Source: Ambulatory Visit | Attending: Emergency Medicine | Admitting: Emergency Medicine

## 2017-01-22 VITALS — BP 124/86 | HR 80 | Ht 62.5 in | Wt 137.0 lb

## 2017-01-22 DIAGNOSIS — R0609 Other forms of dyspnea: Secondary | ICD-10-CM

## 2017-01-22 DIAGNOSIS — J441 Chronic obstructive pulmonary disease with (acute) exacerbation: Secondary | ICD-10-CM | POA: Insufficient documentation

## 2017-01-22 DIAGNOSIS — J440 Chronic obstructive pulmonary disease with acute lower respiratory infection: Secondary | ICD-10-CM

## 2017-01-22 DIAGNOSIS — R0600 Dyspnea, unspecified: Secondary | ICD-10-CM | POA: Diagnosis not present

## 2017-01-22 DIAGNOSIS — J449 Chronic obstructive pulmonary disease, unspecified: Secondary | ICD-10-CM | POA: Insufficient documentation

## 2017-01-22 NOTE — Assessment & Plan Note (Signed)
She appears to be back to baseline. She does still have some occasional cough, occasional thin clear mucus. Continue treatment of her GERD. She denies any allergies. We will focus on evaluating her dyspnea, assess for COPD. Clearly she will need to stop smoking. We discussed this.

## 2017-01-22 NOTE — Assessment & Plan Note (Signed)
Suspect COPD. We will perform full pulmonary function testing. Also note that she has lupus, is on Plaquenil. She is at some risk for interstitial lung disease. Low threshold to check a CT scan of the chest depending on these results.

## 2017-01-22 NOTE — Patient Instructions (Addendum)
We will perform pulmonary function testing  We will perform a chest x-ray today. Keep albuterol available to use 2 puffs if needed for shortness of breath, wheezing, coughing spells Depending on your workup we may decide to start inhaled medications to take every day. We will perform walking oximetry next time.  Follow with Dr Lamonte Sakai next available with full PFT

## 2017-01-22 NOTE — Progress Notes (Signed)
Subjective:    Patient ID: Sheila Morales, female    DOB: [birth year], 66 y.o.   MRN: [id number]  HPI  66 year old smoker (50 + pack years) with a history of irritable bowel syndrome, lupus on immunosuppression (Plaquenil), history of rheumatic fever, GERD. She was evaluated for cough started about 2 months ago, occasionally prod of clear phlegm. No blood. Was seen by her PCP, ? Had peak flow performed. Was given cough syrup, albuterol, worked temporarily to help cough. She is PPI, does not have any breakthrough GERD sx. No allergies. She was treated 1 month ago with trelegy as a trial, finished a month ago. She believes that her cough may have improved on this. Her breathing has not changed much. She denies any CP. She does not wheeze. No CXR since 2015. She was on prednisone last month for her lupus.    Review of Systems  Constitutional: Positive for appetite change. Negative for fever and unexpected weight change.  HENT: Negative for congestion, dental problem, ear pain, nosebleeds, postnasal drip, rhinorrhea, sinus pressure, sneezing, sore throat and trouble swallowing.   Eyes: Negative for redness and itching.  Respiratory: Positive for cough and shortness of breath. Negative for chest tightness and wheezing.   Cardiovascular: Negative for palpitations and leg swelling.  Gastrointestinal: Negative for nausea and vomiting.  Genitourinary: Negative for dysuria.  Musculoskeletal: Positive for joint swelling.  Skin: Positive for rash.  Neurological: Negative for headaches.  Hematological: Does not bruise/bleed easily.  Psychiatric/Behavioral: Negative for dysphoric mood. The patient is not nervous/anxious.     Past Medical History:  Diagnosis Date  . Arthritis   . Colitis, ischemic ([place])   . External hemorrhoids   . H/O: rheumatic fever   . IBS (irritable bowel syndrome)   . Personal history colonic adenoma 03/25/2008   06/2007 right sided adenoma and 2 right hyperplastic polyps    .  Restless leg syndrome    questionable  . Systemic lupus erythematosus ([place])   . Tubular adenoma of colon   . 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 Stroke Mother        Deceased, 32s  . Atrial fibrillation Father   . Colon cancer Father   . Other Sister        Pick's disease, deceased  . Healthy Daughter      Social History   Social History  . Marital status: Married    Spouse name: N/A  . Number of children: 2  . Years of education: 12   Occupational History  . cleaning business    Social History Main Topics  . Smoking status: Current Every Day Smoker    Packs/day: 0.50    Years: 30.00    Types: Cigarettes  . Smokeless tobacco: Never Used     Comment: Declined info  . Alcohol use 3.0 oz/week    5 Standard drinks or equivalent per week     Comment: 2 glasses of wine nightly, 4 beers nightly on weekend  . Drug use: No  . Sexual activity: Yes   Other Topics Concern  . Not on file   Social History Narrative   HSG. Married '71. 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         formerly worked Architect, Museum/gallery conservator, currently does home care, house clearing    No  Known Allergies   Outpatient Medications Prior to Visit  Medication Sig Dispense Refill  . albuterol (PROVENTIL HFA;VENTOLIN HFA) 108 (90 Base) MCG/ACT inhaler Inhale 2 puffs into the lungs every 6 (six) hours as needed for wheezing or shortness of breath. 1 Inhaler 0  . aspirin 81 MG tablet Take 81 mg by mouth daily.    . cholecalciferol (VITAMIN D) 1000 UNITS tablet Take 1,000 Units by mouth daily.      . colestipol (COLESTID) 5 G granules 1 scoop alternating with 1/2 scoop 500 g 12  . Fluticasone-Umeclidin-Vilant (TRELEGY ELLIPTA) 100-62.5-25 MCG/INH AEPB Inhale 1 application into the lungs daily. Rinse mouth after use 28 each 0  . gabapentin  (NEURONTIN) 300 MG capsule Start taking 1 tab in the morning and afternoon and 3 tablets at bedtime.  OK to take extra dose as needed. (Patient taking differently: 3 (three) times daily. 2 po qam, 2 po afternoon, 3 po qhs) 540 capsule 3  . guaiFENesin-dextromethorphan (ROBITUSSIN DM) 100-10 MG/5ML syrup Take 5 mLs by mouth every 4 (four) hours as needed for cough. 118 mL 0  . hydroxychloroquine (PLAQUENIL) 200 MG tablet Take 200 mg by mouth 2 (two) times daily.    . MULTIPLE VITAMIN PO Take 1 tablet by mouth daily.      Marland Kitchen omeprazole (PRILOSEC) 40 MG capsule Take 1 capsule (40 mg total) by mouth daily before breakfast. 30 capsule 11  . Probiotic Product (PROBIOTIC DAILY PO) Take by mouth.    Marland Kitchen UNABLE TO FIND Med Name: study drug, not known what it is.    . vitamin C (ASCORBIC ACID) 500 MG tablet Take 500 mg by mouth daily.      Marland Kitchen zolpidem (AMBIEN) 5 MG tablet TAKE 1 TABLET BY MOUTH AT BEDTIME AS NEEDED 30 tablet 1  . triamcinolone cream (KENALOG) 0.1 %      No facility-administered medications prior to visit.         Objective:   Physical Exam Vitals:   01/22/17 1451 01/22/17 1453  BP:  124/86  Pulse:  80  SpO2:  97%  Weight: 62.1 kg (137 lb)   Height: 5' 2.5" (1.588 m)    Gen: Pleasant, well-nourished, in no distress,  normal affect  ENT: No lesions,  mouth clear,  oropharynx clear, no postnasal drip  Neck: No JVD, no Stridor  Lungs: No use of accessory muscles,  clear without rales or rhonchi  Cardiovascular: RRR, heart sounds normal, no murmur or gallops, no peripheral edema  Musculoskeletal: No deformities, no cyanosis or clubbing  Neuro: alert, non focal  Skin: Scattered red macular lesions on the arms, legs, back with various degrees of resolution related to her lupus     Assessment & Plan:  Cough She appears to be back to baseline. She does still have some occasional cough, occasional thin clear mucus. Continue treatment of her GERD. She denies any allergies. We  will focus on evaluating her dyspnea, assess for COPD. Clearly she will need to stop smoking. We discussed this.  TOBACCO USE DISORDER/SMOKER-SMOKING CESSATION DISCUSSED Discussed cessation with her today. We will revisit next time discuss making a plan to decrease and quit  Dyspnea on exertion Suspect COPD. We will perform full pulmonary function testing. Also note that she has lupus, is on Plaquenil. She is at some risk for interstitial lung disease. Low threshold to check a CT scan of the chest depending on these results.  Baltazar Apo, MD, PhD 01/22/2017, 3:26 PM [facility] [phone] or if no answer [phone]

## 2017-01-22 NOTE — Assessment & Plan Note (Signed)
Discussed cessation with her today. We will revisit next time discuss making a plan to decrease and quit

## 2017-01-23 ENCOUNTER — Telehealth: Payer: Self-pay | Admitting: Emergency Medicine

## 2017-01-23 DIAGNOSIS — J209 Acute bronchitis, unspecified: Secondary | ICD-10-CM

## 2017-01-23 MED ORDER — ALBUTEROL SULFATE HFA 108 (90 BASE) MCG/ACT IN AERS: 2.0000 | INHALATION_SPRAY | Freq: Four times a day (QID) | RESPIRATORY_TRACT | 5 refills | Status: DC | PRN

## 2017-01-23 NOTE — Telephone Encounter (Signed)
Per yesterday's AVS: Instructions  We will perform pulmonary function testing  We will perform a chest x-ray today. Keep albuterol available to use 2 puffs if needed for shortness of breath, wheezing, coughing spells Depending on your workup we may decide to start inhaled medications to take every day. We will perform walking oximetry next time.  Follow with Dr Lamonte Sakai next available with full PFT   Re-relayed these recs with pt.  Sent albuterol rx to preferred pharmacy.  Nothing further needed.

## 2017-02-06 ENCOUNTER — Encounter: Payer: Self-pay | Admitting: Emergency Medicine

## 2017-02-06 ENCOUNTER — Ambulatory Visit (INDEPENDENT_AMBULATORY_CARE_PROVIDER_SITE_OTHER): Payer: Medicare Other | Admitting: Emergency Medicine

## 2017-02-06 DIAGNOSIS — F17200 Nicotine dependence, unspecified, uncomplicated: Secondary | ICD-10-CM | POA: Diagnosis not present

## 2017-02-06 DIAGNOSIS — J449 Chronic obstructive pulmonary disease, unspecified: Secondary | ICD-10-CM

## 2017-02-06 DIAGNOSIS — R0609 Other forms of dyspnea: Secondary | ICD-10-CM

## 2017-02-06 LAB — PULMONARY FUNCTION TEST
DL/VA % pred: 66 %
DL/VA: 3.12 ml/min/mmHg/L
DLCO cor % pred: 53 %
DLCO cor: 12.34 ml/min/mmHg
DLCO unc % pred: 54 %
DLCO unc: 12.45 ml/min/mmHg
FEF 25-75 Post: 0.78 L/sec
FEF 25-75 Pre: 0.73 L/sec
FEF2575-%Change-Post: 6 %
FEF2575-%Pred-Post: 39 %
FEF2575-%Pred-Pre: 37 %
FEV1-%Change-Post: 1 %
FEV1-%Pred-Post: 57 %
FEV1-%Pred-Pre: 56 %
FEV1-Post: 1.3 L
FEV1-Pre: 1.28 L
FEV1FVC-%Change-Post: -1 %
FEV1FVC-%Pred-Pre: 77 %
FEV6-%Change-Post: -1 %
FEV6-%Pred-Post: 73 %
FEV6-%Pred-Pre: 74 %
FEV6-Post: 2.08 L
FEV6-Pre: 2.11 L
FEV6FVC-%Change-Post: 0 %
FEV6FVC-%Pred-Post: 102 %
FEV6FVC-%Pred-Pre: 103 %
FVC-%Change-Post: 3 %
FVC-%Pred-Post: 74 %
FVC-%Pred-Pre: 72 %
FVC-Post: 2.22 L
FVC-Pre: 2.14 L
Post FEV1/FVC ratio: 59 %
Post FEV6/FVC ratio: 99 %
Pre FEV1/FVC ratio: 60 %
Pre FEV6/FVC Ratio: 99 %
RV % pred: 107 %
RV: 2.23 L
TLC % pred: 94 %
TLC: 4.65 L

## 2017-02-06 MED ORDER — UMECLIDINIUM-VILANTEROL 62.5-25 MCG/INH IN AEPB: 1.0000 | INHALATION_SPRAY | Freq: Every day | RESPIRATORY_TRACT | 5 refills | Status: DC

## 2017-02-06 NOTE — Progress Notes (Signed)
PFT completed today 02/06/17.  

## 2017-02-06 NOTE — Progress Notes (Signed)
Subjective:    Patient ID: Sheila Morales, female    DOB: 1950/06/13, 66 y.o.   MRN: [id number]  Shortness of Breath  Associated symptoms include a rash. Pertinent negatives include no ear pain, fever, headaches, leg swelling, rhinorrhea, sore throat, vomiting or wheezing.    66 year old smoker (50 + pack years) with a history of irritable bowel syndrome, lupus on immunosuppression (Plaquenil), history of rheumatic fever, GERD. She was evaluated for cough started about 2 months ago, occasionally prod of clear phlegm. No blood. Was seen by her PCP, ? Had peak flow performed. Was given cough syrup, albuterol, worked temporarily to help cough. She is PPI, does not have any breakthrough GERD sx. No allergies. She was treated 1 month ago with trelegy as a trial, finished a month ago. She believes that her cough may have improved on this. Her breathing has not changed much. She denies any CP. She does not wheeze. No CXR since 2015. She was on prednisone last month for her lupus.   ROV 02/06/17 -- Patient has a history of tobacco, IBS, lupus, esophageal reflux. She is here to follow up her shortness of breath and cough. CXR 01/23/17 with no infiltrates. She underwent PFT today that I have reviewed, show severe obstruction, no BD response, normal volumes, decreased DLCO.  She has trialed trelegy before, felt that she probably benefited.    Review of Systems  Constitutional: Negative for appetite change, fever and unexpected weight change.  HENT: Negative for congestion, dental problem, ear pain, nosebleeds, postnasal drip, rhinorrhea, sinus pressure, sneezing, sore throat and trouble swallowing.   Eyes: Negative for redness and itching.  Respiratory: Positive for shortness of breath. Negative for cough, chest tightness and wheezing.   Cardiovascular: Negative for palpitations and leg swelling.  Gastrointestinal: Negative for nausea and vomiting.  Genitourinary: Negative for dysuria.  Musculoskeletal:  Negative for joint swelling.  Skin: Positive for rash.  Neurological: Negative for headaches.  Hematological: Does not bruise/bleed easily.  Psychiatric/Behavioral: Negative for dysphoric mood. The patient is not nervous/anxious.     Past Medical History:  Diagnosis Date  . Arthritis   . Colitis, ischemic ([place])   . External hemorrhoids   . H/O: rheumatic fever   . IBS (irritable bowel syndrome)   . Personal history colonic adenoma 03/25/2008   06/2007 right sided adenoma and 2 right hyperplastic polyps    . Restless leg syndrome    questionable  . Systemic lupus erythematosus ([place])   . Tubular adenoma of colon   . 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 Stroke Mother        Deceased, 21s  . Atrial fibrillation Father   . Colon cancer Father   . Other Sister        Pick's disease, deceased  . Healthy Daughter      Social History   Social History  . Marital status: Married    Spouse name: N/A  . Number of children: 2  . Years of education: 12   Occupational History  . cleaning business    Social History Main Topics  . Smoking status: Current Every Day Smoker    Packs/day: 0.50    Years: 30.00    Types: Cigarettes  . Smokeless tobacco: Never Used     Comment: Declined info  . Alcohol use 3.0 oz/week    5 Standard drinks or equivalent per week  Comment: 2 glasses of wine nightly, 4 beers nightly on weekend  . Drug use: No  . Sexual activity: Yes   Other Topics Concern  . Not on file   Social History Narrative   HSG. Married '71. 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         formerly worked Architect, Museum/gallery conservator, currently does home care, house clearing    No Known Allergies   Outpatient Medications Prior to Visit  Medication Sig Dispense Refill  . albuterol (PROVENTIL HFA;VENTOLIN HFA) 108 (90  Base) MCG/ACT inhaler Inhale 2 puffs into the lungs every 6 (six) hours as needed for wheezing or shortness of breath. 1 Inhaler 5  . aspirin 81 MG tablet Take 81 mg by mouth daily.    . cholecalciferol (VITAMIN D) 1000 UNITS tablet Take 1,000 Units by mouth daily.      . colestipol (COLESTID) 5 G granules 1 scoop alternating with 1/2 scoop 500 g 12  . gabapentin (NEURONTIN) 300 MG capsule Start taking 1 tab in the morning and afternoon and 3 tablets at bedtime.  OK to take extra dose as needed. (Patient taking differently: 3 (three) times daily. 2 po qam, 2 po afternoon, 3 po qhs) 540 capsule 3  . guaiFENesin-dextromethorphan (ROBITUSSIN DM) 100-10 MG/5ML syrup Take 5 mLs by mouth every 4 (four) hours as needed for cough. 118 mL 0  . hydroxychloroquine (PLAQUENIL) 200 MG tablet Take 200 mg by mouth 2 (two) times daily.    . MULTIPLE VITAMIN PO Take 1 tablet by mouth daily.      Marland Kitchen omeprazole (PRILOSEC) 40 MG capsule Take 1 capsule (40 mg total) by mouth daily before breakfast. 30 capsule 11  . UNABLE TO FIND Med Name: study drug, not known what it is.    . vitamin C (ASCORBIC ACID) 500 MG tablet Take 500 mg by mouth daily.      Marland Kitchen zolpidem (AMBIEN) 5 MG tablet TAKE 1 TABLET BY MOUTH AT BEDTIME AS NEEDED 30 tablet 1  . Fluticasone-Umeclidin-Vilant (TRELEGY ELLIPTA) 100-62.5-25 MCG/INH AEPB Inhale 1 application into the lungs daily. Rinse mouth after use 28 each 0  . Probiotic Product (PROBIOTIC DAILY PO) Take by mouth.     No facility-administered medications prior to visit.         Objective:   Physical Exam Vitals:   02/06/17 1606 02/06/17 1607  BP:  130/80  Pulse:  89  SpO2:  96%  Weight: 138 lb (62.6 kg)   Height: 5\' 3"  (1.6 m)    Gen: Pleasant, well-nourished, in no distress,  normal affect  ENT: No lesions,  mouth clear,  oropharynx clear, no postnasal drip  Neck: No JVD, no Stridor  Lungs: No use of accessory muscles,  clear without rales or rhonchi  Cardiovascular: RRR,  heart sounds normal, no murmur or gallops, no peripheral edema  Musculoskeletal: No deformities, no cyanosis or clubbing  Neuro: alert, non focal  Skin: Scattered red macular lesions on the arms, legs, back with various degrees of resolution related to her lupus     Assessment & Plan:  COPD (chronic obstructive pulmonary disease) (HCC) We will start Anoro one inhalation once a day. Remember to rinse and gargle after using this medication Keep albuterol available to use 2 puffs if needed for shortness of breath Flu shot today Follow with Dr Lamonte Sakai in 3 months or sooner if you have any problems.  TOBACCO  USE DISORDER/SMOKER-SMOKING CESSATION DISCUSSED You need to work on decreasing her smoking. Once you have cut down we can talk about trying to set a quit date and stop altogether.  Baltazar Apo, MD, PhD 02/06/2017, 4:29 PM [facility] [phone] or if no answer [phone]

## 2017-02-06 NOTE — Assessment & Plan Note (Signed)
You need to work on decreasing her smoking. Once you have cut down we can talk about trying to set a quit date and stop altogether.

## 2017-02-06 NOTE — Assessment & Plan Note (Signed)
We will start Anoro one inhalation once a day. Remember to rinse and gargle after using this medication Keep albuterol available to use 2 puffs if needed for shortness of breath Flu shot today Follow with Dr Lamonte Sakai in 3 months or sooner if you have any problems.

## 2017-02-06 NOTE — Patient Instructions (Addendum)
We will start Anoro one inhalation once a day. Remember to rinse and gargle after using this medication Keep albuterol available to use 2 puffs if needed for shortness of breath Flu shot today You need to work on decreasing her smoking. Once you have cut down we can talk about trying to set a quit date and stop altogether. Follow with Dr Lamonte Sakai in 3 months or sooner if you have any problems.

## 2017-02-11 ENCOUNTER — Other Ambulatory Visit: Payer: Self-pay | Admitting: *Deleted

## 2017-02-11 ENCOUNTER — Telehealth: Payer: Self-pay | Admitting: Neurology

## 2017-02-11 MED ORDER — NORTRIPTYLINE HCL 10 MG PO CAPS: 10.0000 mg | ORAL_CAPSULE | Freq: Every day | ORAL | 5 refills | Status: DC

## 2017-02-11 NOTE — Telephone Encounter (Signed)
OK to start nortriptyline 10mg  at bedtime for 2 week, then increase to 2 tablet at bedtime, #60, 5 refills.  Please tell her to slowly reduce her gabapentin by 300mg  every 3 days.

## 2017-02-11 NOTE — Telephone Encounter (Signed)
Patient given instructions to start the nortriptyline.  She is going to finish up her gabapentin first since she just had it filled.  Rx sent in.

## 2017-02-11 NOTE — Telephone Encounter (Signed)
New Message  Pt c/o medication issue:  1. Name of Medication: gabapentin (Neurontin) 300 mg capsule  2. How are you currently taking this medication (dosage and times per day)? morning, afternoon, and 3 tablets at bedtime  3. Are you having a reaction (difficulty breathing--STAT)? N/A  4. What is your medication issue? Pt voiced this medication is too high and would like to be switched back to the medication she was on or if she can be placed on something more cost effective.

## 2017-02-11 NOTE — Telephone Encounter (Signed)
Please advise 

## 2017-02-20 ENCOUNTER — Encounter: Payer: Medicare Other | Admitting: Internal Medicine

## 2017-02-24 ENCOUNTER — Encounter: Payer: Self-pay | Admitting: Internal Medicine

## 2017-02-24 ENCOUNTER — Ambulatory Visit (INDEPENDENT_AMBULATORY_CARE_PROVIDER_SITE_OTHER): Payer: Medicare Other | Admitting: Internal Medicine

## 2017-02-24 ENCOUNTER — Telehealth: Payer: Self-pay | Admitting: Neurology

## 2017-02-24 ENCOUNTER — Other Ambulatory Visit (INDEPENDENT_AMBULATORY_CARE_PROVIDER_SITE_OTHER): Payer: Medicare Other

## 2017-02-24 VITALS — BP 116/70 | HR 92 | Temp 98.1°F | Ht 63.0 in | Wt 136.0 lb

## 2017-02-24 DIAGNOSIS — F17200 Nicotine dependence, unspecified, uncomplicated: Secondary | ICD-10-CM

## 2017-02-24 DIAGNOSIS — E785 Hyperlipidemia, unspecified: Secondary | ICD-10-CM

## 2017-02-24 DIAGNOSIS — Z0000 Encounter for general adult medical examination without abnormal findings: Secondary | ICD-10-CM | POA: Diagnosis not present

## 2017-02-24 DIAGNOSIS — R7301 Impaired fasting glucose: Secondary | ICD-10-CM | POA: Diagnosis not present

## 2017-02-24 DIAGNOSIS — I10 Essential (primary) hypertension: Secondary | ICD-10-CM | POA: Diagnosis not present

## 2017-02-24 DIAGNOSIS — G4700 Insomnia, unspecified: Secondary | ICD-10-CM | POA: Insufficient documentation

## 2017-02-24 DIAGNOSIS — M329 Systemic lupus erythematosus, unspecified: Secondary | ICD-10-CM | POA: Diagnosis not present

## 2017-02-24 DIAGNOSIS — F5101 Primary insomnia: Secondary | ICD-10-CM

## 2017-02-24 DIAGNOSIS — Z23 Encounter for immunization: Secondary | ICD-10-CM | POA: Diagnosis not present

## 2017-02-24 DIAGNOSIS — J410 Simple chronic bronchitis: Secondary | ICD-10-CM | POA: Diagnosis not present

## 2017-02-24 LAB — CBC
HCT: 38.7 % (ref 36.0–46.0)
HEMOGLOBIN: 13.1 g/dL (ref 12.0–15.0)
MCHC: 33.9 g/dL (ref 30.0–36.0)
MCV: 92.1 fl (ref 78.0–100.0)
PLATELETS: 191 10*3/uL (ref 150.0–400.0)
RBC: 4.2 Mil/uL (ref 3.87–5.11)
RDW: 12.6 % (ref 11.5–15.5)
WBC: 5.9 10*3/uL (ref 4.0–10.5)

## 2017-02-24 LAB — COMPREHENSIVE METABOLIC PANEL
ALK PHOS: 76 U/L (ref 39–117)
ALT: 25 U/L (ref 0–35)
AST: 30 U/L (ref 0–37)
Albumin: 4.1 g/dL (ref 3.5–5.2)
BILIRUBIN TOTAL: 0.3 mg/dL (ref 0.2–1.2)
BUN: 15 mg/dL (ref 6–23)
CO2: 28 mEq/L (ref 19–32)
CREATININE: 0.77 mg/dL (ref 0.40–1.20)
Calcium: 9.8 mg/dL (ref 8.4–10.5)
Chloride: 98 mEq/L (ref 96–112)
GFR: 79.4 mL/min (ref >60.00)
GLUCOSE: 103 mg/dL — AB (ref 70–99)
Potassium: 4.3 mEq/L (ref 3.5–5.1)
SODIUM: 132 meq/L — AB (ref 135–145)
TOTAL PROTEIN: 7.3 g/dL (ref 6.0–8.3)

## 2017-02-24 LAB — LIPID PANEL
Cholesterol: 146 mg/dL (ref 0–200)
HDL: 70.8 mg/dL (ref >39.00)
NONHDL: 75.5
Total CHOL/HDL Ratio: 2
Triglycerides: 288 mg/dL — ABNORMAL HIGH (ref 0.0–149.0)
VLDL: 57.6 mg/dL — ABNORMAL HIGH (ref 0.0–40.0)

## 2017-02-24 LAB — LDL CHOLESTEROL, DIRECT: Direct LDL: 52 mg/dL

## 2017-02-24 LAB — HEMOGLOBIN A1C: Hgb A1c MFr Bld: 5.7 % (ref 4.6–6.5)

## 2017-02-24 MED ORDER — VARENICLINE TARTRATE 0.5 MG X 11 & 1 MG X 42 PO MISC: ORAL | 0 refills | Status: DC

## 2017-02-24 MED ORDER — VARENICLINE TARTRATE 1 MG PO TABS: 1.0000 mg | ORAL_TABLET | Freq: Two times a day (BID) | ORAL | 3 refills | Status: DC

## 2017-02-24 NOTE — Assessment & Plan Note (Signed)
Counseled about sun safety and mole surveillance. Given pneumonia 23 today to complete series. Declines tdap today. Flu shot done. Colonoscopy and mammogram up to date. Given 10 year screening recommendations.

## 2017-02-24 NOTE — Assessment & Plan Note (Signed)
She is willing to try to quit. Currently at 1 PPD. Rx for chantix and advised to quit within the first month as well as 1 drink per day limit on chantix. She will take for 3 months to maximize her chances to quit.

## 2017-02-24 NOTE — Assessment & Plan Note (Signed)
Still taking plaquenil and getting yearly eye exam. Through rheumatology.

## 2017-02-24 NOTE — Addendum Note (Signed)
Addended by: Raford Pitcher R on: 02/24/2017 02:14 PM   Modules accepted: Orders

## 2017-02-24 NOTE — Patient Instructions (Signed)
We are checking the labs today.   Health Maintenance, Female Adopting a healthy lifestyle and getting preventive care can go a long way to promote health and wellness. Talk with your health care provider about what schedule of regular examinations is right for you. This is a good chance for you to check in with your provider about disease prevention and staying healthy. In between checkups, there are plenty of things you can do on your own. Experts have done a lot of research about which lifestyle changes and preventive measures are most likely to keep you healthy. Ask your health care provider for more information. Weight and diet Eat a healthy diet  Be sure to include plenty of vegetables, fruits, low-fat dairy products, and lean protein.  Do not eat a lot of foods high in solid fats, added sugars, or salt.  Get regular exercise. This is one of the most important things you can do for your health. ? Most adults should exercise for at least 150 minutes each week. The exercise should increase your heart rate and make you sweat (moderate-intensity exercise). ? Most adults should also do strengthening exercises at least twice a week. This is in addition to the moderate-intensity exercise.  Maintain a healthy weight  Body mass index (BMI) is a measurement that can be used to identify possible weight problems. It estimates body fat based on height and weight. Your health care provider can help determine your BMI and help you achieve or maintain a healthy weight.  For females 8 years of age and older: ? A BMI below 18.5 is considered underweight. ? A BMI of 18.5 to 24.9 is normal. ? A BMI of 25 to 29.9 is considered overweight. ? A BMI of 30 and above is considered obese.  Watch levels of cholesterol and blood lipids  You should start having your blood tested for lipids and cholesterol at 67 years of age, then have this test every 5 years.  You may need to have your cholesterol levels  checked more often if: ? Your lipid or cholesterol levels are high. ? You are older than 67 years of age. ? You are at high risk for heart disease.  Cancer screening Lung Cancer  Lung cancer screening is recommended for adults 67-38 years old who are at high risk for lung cancer because of a history of smoking.  A yearly low-dose CT scan of the lungs is recommended for people who: ? Currently smoke. ? Have quit within the past 15 years. ? Have at least a 30-pack-year history of smoking. A pack year is smoking an average of one pack of cigarettes a day for 1 year.  Yearly screening should continue until it has been 15 years since you quit.  Yearly screening should stop if you develop a health problem that would prevent you from having lung cancer treatment.  Breast Cancer  Practice breast self-awareness. This means understanding how your breasts normally appear and feel.  It also means doing regular breast self-exams. Let your health care provider know about any changes, no matter how small.  If you are in your 20s or 30s, you should have a clinical breast exam (CBE) by a health care provider every 1-3 years as part of a regular health exam.  If you are 67 or older, have a CBE every year. Also consider having a breast X-ray (mammogram) every year.  If you have a family history of breast cancer, talk to your health care provider about genetic  screening.  If you are at high risk for breast cancer, talk to your health care provider about having an MRI and a mammogram every year.  Breast cancer gene (BRCA) assessment is recommended for women who have family members with BRCA-related cancers. BRCA-related cancers include: ? Breast. ? Ovarian. ? Tubal. ? Peritoneal cancers.  Results of the assessment will determine the need for genetic counseling and BRCA1 and BRCA2 testing.  Cervical Cancer Your health care provider may recommend that you be screened regularly for cancer of the  pelvic organs (ovaries, uterus, and vagina). This screening involves a pelvic examination, including checking for microscopic changes to the surface of your cervix (Pap test). You may be encouraged to have this screening done every 3 years, beginning at age 21.  For women ages 30-65, health care providers may recommend pelvic exams and Pap testing every 3 years, or they may recommend the Pap and pelvic exam, combined with testing for human papilloma virus (HPV), every 5 years. Some types of HPV increase your risk of cervical cancer. Testing for HPV may also be done on women of any age with unclear Pap test results.  Other health care providers may not recommend any screening for nonpregnant women who are considered low risk for pelvic cancer and who do not have symptoms. Ask your health care provider if a screening pelvic exam is right for you.  If you have had past treatment for cervical cancer or a condition that could lead to cancer, you need Pap tests and screening for cancer for at least 20 years after your treatment. If Pap tests have been discontinued, your risk factors (such as having a new sexual partner) need to be reassessed to determine if screening should resume. Some women have medical problems that increase the chance of getting cervical cancer. In these cases, your health care provider may recommend more frequent screening and Pap tests.  Colorectal Cancer  This type of cancer can be detected and often prevented.  Routine colorectal cancer screening usually begins at 67 years of age and continues through 67 years of age.  Your health care provider may recommend screening at an earlier age if you have risk factors for colon cancer.  Your health care provider may also recommend using home test kits to check for hidden blood in the stool.  A small camera at the end of a tube can be used to examine your colon directly (sigmoidoscopy or colonoscopy). This is done to check for the  earliest forms of colorectal cancer.  Routine screening usually begins at age 50.  Direct examination of the colon should be repeated every 5-10 years through 67 years of age. However, you may need to be screened more often if early forms of precancerous polyps or small growths are found.  Skin Cancer  Check your skin from head to toe regularly.  Tell your health care provider about any new moles or changes in moles, especially if there is a change in a mole's shape or color.  Also tell your health care provider if you have a mole that is larger than the size of a pencil eraser.  Always use sunscreen. Apply sunscreen liberally and repeatedly throughout the day.  Protect yourself by wearing long sleeves, pants, a wide-brimmed hat, and sunglasses whenever you are outside.  Heart disease, diabetes, and high blood pressure  High blood pressure causes heart disease and increases the risk of stroke. High blood pressure is more likely to develop in: ? People who   have blood pressure in the high end of the normal range (130-139/85-89 mm Hg). ? People who are overweight or obese. ? People who are African American.  If you are 19-29 years of age, have your blood pressure checked every 3-5 years. If you are 30 years of age or older, have your blood pressure checked every year. You should have your blood pressure measured twice-once when you are at a hospital or clinic, and once when you are not at a hospital or clinic. Record the average of the two measurements. To check your blood pressure when you are not at a hospital or clinic, you can use: ? An automated blood pressure machine at a pharmacy. ? A home blood pressure monitor.  If you are between 65 years and 63 years old, ask your health care provider if you should take aspirin to prevent strokes.  Have regular diabetes screenings. This involves taking a blood sample to check your fasting blood sugar level. ? If you are at a normal weight and  have a low risk for diabetes, have this test once every three years after 67 years of age. ? If you are overweight and have a high risk for diabetes, consider being tested at a younger age or more often. Preventing infection Hepatitis B  If you have a higher risk for hepatitis B, you should be screened for this virus. You are considered at high risk for hepatitis B if: ? You were born in a country where hepatitis B is common. Ask your health care provider which countries are considered high risk. ? Your parents were born in a high-risk country, and you have not been immunized against hepatitis B (hepatitis B vaccine). ? You have HIV or AIDS. ? You use needles to inject street drugs. ? You live with someone who has hepatitis B. ? You have had sex with someone who has hepatitis B. ? You get hemodialysis treatment. ? You take certain medicines for conditions, including cancer, organ transplantation, and autoimmune conditions.  Hepatitis C  Blood testing is recommended for: ? Everyone born from [birth year] through 1966. ? Anyone with known risk factors for hepatitis C.  Sexually transmitted infections (STIs)  You should be screened for sexually transmitted infections (STIs) including gonorrhea and chlamydia if: ? You are sexually active and are younger than 67 years of age. ? You are older than 67 years of age and your health care provider tells you that you are at risk for this type of infection. ? Your sexual activity has changed since you were last screened and you are at an increased risk for chlamydia or gonorrhea. Ask your health care provider if you are at risk.  If you do not have HIV, but are at risk, it may be recommended that you take a prescription medicine daily to prevent HIV infection. This is called pre-exposure prophylaxis (PrEP). You are considered at risk if: ? You are sexually active and do not regularly use condoms or know the HIV status of your partner(s). ? You take drugs by  injection. ? You are sexually active with a partner who has HIV.  Talk with your health care provider about whether you are at high risk of being infected with HIV. If you choose to begin PrEP, you should first be tested for HIV. You should then be tested every 3 months for as long as you are taking PrEP. Pregnancy  If you are premenopausal and you may become pregnant, ask your health care provider  about preconception counseling.  If you may become pregnant, take 400 to 800 micrograms (mcg) of folic acid every day.  If you want to prevent pregnancy, talk to your health care provider about birth control (contraception). Osteoporosis and menopause  Osteoporosis is a disease in which the bones lose minerals and strength with aging. This can result in serious bone fractures. Your risk for osteoporosis can be identified using a bone density scan.  If you are 6 years of age or older, or if you are at risk for osteoporosis and fractures, ask your health care provider if you should be screened.  Ask your health care provider whether you should take a calcium or vitamin D supplement to lower your risk for osteoporosis.  Menopause may have certain physical symptoms and risks.  Hormone replacement therapy may reduce some of these symptoms and risks. Talk to your health care provider about whether hormone replacement therapy is right for you. Follow these instructions at home:  Schedule regular health, dental, and eye exams.  Stay current with your immunizations.  Do not use any tobacco products including cigarettes, chewing tobacco, or electronic cigarettes.  If you are pregnant, do not drink alcohol.  If you are breastfeeding, limit how much and how often you drink alcohol.  Limit alcohol intake to no more than 1 drink per day for nonpregnant women. One drink equals 12 ounces of beer, 5 ounces of wine, or 1 ounces of hard liquor.  Do not use street drugs.  Do not share needles.  Ask  your health care provider for help if you need support or information about quitting drugs.  Tell your health care provider if you often feel depressed.  Tell your health care provider if you have ever been abused or do not feel safe at home. This information is not intended to replace advice given to you by your health care provider. Make sure you discuss any questions you have with your health care provider. Document Released: 10/22/2010 Document Revised: 09/14/2015 Document Reviewed: 01/10/2015 Elsevier Interactive Patient Education  [employer].

## 2017-02-24 NOTE — Assessment & Plan Note (Signed)
Primary and taking Sheila Morales which will be refilled as needed. She is aware of the risk of dependence and memory change and increased risk of falls. Would like to continue and not taking daily.

## 2017-02-24 NOTE — Assessment & Plan Note (Signed)
She is willing to try quitting smoking with chantix which is prescribed today. Taking anoro and will follow up with pulmonary.

## 2017-02-24 NOTE — Telephone Encounter (Signed)
Patient left a message asking for you to please call her. She did not say why on the message . Thanks

## 2017-02-24 NOTE — Progress Notes (Signed)
   Subjective:    Patient ID: Sheila Morales, female    DOB: 04-17-1951, 66 y.o.   MRN: [id number]  HPI Here for medicare wellness, no new complaints. Please see A/P for status and treatment of chronic medical problems.   HPI #2: Here for follow up of insomnia (taking ambien nightly, denies side effects, still using but not nightly, wishes to continue given increase in QOL), her smoking (still smoking some, would like to try to quit with chantix, down to 1 PPD from 2 PPD, chronic cough), and her cough (she is taking the anoro, some decrease, she wakes up at night with coughing, using cough medicine sometimes for the cough).   Diet: heart healthy Physical activity: sedentary Depression/mood screen: negative Hearing: intact to whispered voice, with aids Visual acuity: grossly normal with chronic dry eye, performs annual eye exam  ADLs: capable Fall risk: none Home safety: good Cognitive evaluation: intact to orientation, naming, recall and repetition EOL planning: adv directives discussed  I have personally reviewed and have noted 1. The patient's medical and social history - reviewed today no changes 2. Their use of alcohol, tobacco or illicit drugs 3. Their current medications and supplements 4. The patient's functional ability including ADL's, fall risks, home safety risks and hearing or visual impairment. 5. Diet and physical activities 6. Evidence for depression or mood disorders 7. Care team reviewed and updated (available in snapshot)  Review of Systems  Constitutional: Negative.   HENT: Negative.   Eyes: Positive for discharge and visual disturbance.  Respiratory: Positive for cough and shortness of breath. Negative for chest tightness.   Cardiovascular: Negative for chest pain, palpitations and leg swelling.  Gastrointestinal: Negative for abdominal distention, abdominal pain, constipation, diarrhea, nausea and vomiting.  Musculoskeletal: Negative.   Skin: Negative.     Neurological: Negative.   Psychiatric/Behavioral: Negative.       Objective:   Physical Exam  Constitutional: She is oriented to person, place, and time. She appears well-developed and well-nourished.  HENT:  Head: Normocephalic and atraumatic.  Eyes: EOM are normal.  Neck: Normal range of motion.  Cardiovascular: Normal rate and regular rhythm.  Pulmonary/Chest: Effort normal and breath sounds normal. No respiratory distress. She has no wheezes. She has no rales.  Abdominal: Soft. Bowel sounds are normal. She exhibits no distension. There is no tenderness. There is no rebound.  Musculoskeletal: She exhibits no edema.  Neurological: She is alert and oriented to person, place, and time. Coordination normal.  Skin: Skin is warm and dry.  Psychiatric: She has a normal mood and affect.   Vitals:   02/24/17 1302  BP: 116/70  Pulse: 92  Temp: 98.1 F (36.7 C)  TempSrc: Oral  SpO2: 99%  Weight: 136 lb (61.7 kg)  Height: 5\' 3"  (1.6 m)      Assessment & Plan:  Pneumonia 23 given at visit

## 2017-02-25 ENCOUNTER — Telehealth: Payer: Self-pay | Admitting: Neurology

## 2017-02-25 NOTE — Telephone Encounter (Signed)
Patient stopped by the office and wanted [place] to know that the Nortiptyline is not covered under her ins and she would like to stay on the Gabapentin 540

## 2017-02-25 NOTE — Telephone Encounter (Signed)
Patient does not want nortriptyline ordered since insurance wont cover it.

## 2017-02-25 NOTE — Telephone Encounter (Signed)
Noted.  Note also received from after hours nurse.

## 2017-02-26 ENCOUNTER — Telehealth: Payer: Self-pay

## 2017-02-26 NOTE — Telephone Encounter (Signed)
PA started on CoverMyMeds KEY: Key: [id number]  When starting PA had a warning that there is only a 35% chance of approval of this medication

## 2017-02-27 ENCOUNTER — Other Ambulatory Visit: Payer: Self-pay | Admitting: Internal Medicine

## 2017-02-27 DIAGNOSIS — Z1231 Encounter for screening mammogram for malignant neoplasm of breast: Secondary | ICD-10-CM

## 2017-03-03 ENCOUNTER — Telehealth: Payer: Self-pay | Admitting: Emergency Medicine

## 2017-03-03 NOTE — Telephone Encounter (Addendum)
Spoke with pt, she states her new insurance will not pay for Anoro next year  Medicare and [employer] would pay for these medications:  Spiriva Flovent Advair Serevent Diskus  Please advise RB.

## 2017-03-05 NOTE — Telephone Encounter (Signed)
Please let her know that none of these medications on the list are equivalent to Anoro.  We could consider changing to Spiriva which is Tier 3, but this would be a step down in her therapy (she would be on less medication).  If we do not believe this will be adequate then we may need to do prior authorization for the Anoro.  If she would like to try the Spiriva beginning in January to see if it is adequate, then I am okay with this plan.

## 2017-03-05 NOTE — Telephone Encounter (Signed)
Spoke with pt, I advised of RB message. I advised her to call us at the beginning of the year to see if we can do a PA on Anoro with her new insurance. Pt understood and will have pharmacy run it through on Jan 1. She has enough of the Anoro until then. Nothing further is needed.

## 2017-03-06 DIAGNOSIS — H01009 Unspecified blepharitis unspecified eye, unspecified eyelid: Secondary | ICD-10-CM | POA: Diagnosis not present

## 2017-03-06 DIAGNOSIS — H01003 Unspecified blepharitis right eye, unspecified eyelid: Secondary | ICD-10-CM | POA: Diagnosis not present

## 2017-03-06 DIAGNOSIS — S0501XA Injury of conjunctiva and corneal abrasion without foreign body, right eye, initial encounter: Secondary | ICD-10-CM | POA: Diagnosis not present

## 2017-03-06 DIAGNOSIS — H04123 Dry eye syndrome of bilateral lacrimal glands: Secondary | ICD-10-CM | POA: Diagnosis not present

## 2017-03-12 DIAGNOSIS — H04123 Dry eye syndrome of bilateral lacrimal glands: Secondary | ICD-10-CM | POA: Diagnosis not present

## 2017-03-12 NOTE — Telephone Encounter (Signed)
PA  Approved,   Coverage Starts on: 02/27/2017 12:00:00 AM, Coverage Ends on: 05/28/2017 12:00:00 AM.

## 2017-04-01 ENCOUNTER — Ambulatory Visit: Payer: Medicare Other

## 2017-04-09 ENCOUNTER — Ambulatory Visit: Payer: Medicare Other | Admitting: Neurology

## 2017-04-09 DIAGNOSIS — Z6825 Body mass index (BMI) 25.0-25.9, adult: Secondary | ICD-10-CM | POA: Diagnosis not present

## 2017-04-09 DIAGNOSIS — Z79899 Other long term (current) drug therapy: Secondary | ICD-10-CM | POA: Diagnosis not present

## 2017-04-09 DIAGNOSIS — M2550 Pain in unspecified joint: Secondary | ICD-10-CM | POA: Diagnosis not present

## 2017-04-09 DIAGNOSIS — M328 Other forms of systemic lupus erythematosus: Secondary | ICD-10-CM | POA: Diagnosis not present

## 2017-04-09 DIAGNOSIS — L932 Other local lupus erythematosus: Secondary | ICD-10-CM | POA: Diagnosis not present

## 2017-04-09 DIAGNOSIS — E663 Overweight: Secondary | ICD-10-CM | POA: Diagnosis not present

## 2017-04-28 DIAGNOSIS — M329 Systemic lupus erythematosus, unspecified: Secondary | ICD-10-CM | POA: Diagnosis not present

## 2017-05-07 ENCOUNTER — Other Ambulatory Visit: Payer: Self-pay | Admitting: Family

## 2017-05-07 ENCOUNTER — Telehealth: Payer: Self-pay | Admitting: Internal Medicine

## 2017-05-07 NOTE — Telephone Encounter (Signed)
Check [place] registry last filled 04/08/2017../lmb  

## 2017-05-07 NOTE — Telephone Encounter (Signed)
Copied from [place] [id number]. Topic: General - Other >> May 07, 2017  9:25 AM Darl Householder, RMA wrote: Reason for CRM: Medication refill request for Zolpidem 5 mg, please call pt when ready for pick up or send to [place]

## 2017-05-08 MED ORDER — ZOLPIDEM TARTRATE 5 MG PO TABS: 5.0000 mg | ORAL_TABLET | Freq: Every evening | ORAL | 1 refills | Status: DC | PRN

## 2017-05-08 NOTE — Telephone Encounter (Signed)
Sent in, is it possible to convert these telephone calls from [facility] to med refills to make filling easier?

## 2017-05-12 DIAGNOSIS — M329 Systemic lupus erythematosus, unspecified: Secondary | ICD-10-CM | POA: Diagnosis not present

## 2017-05-13 ENCOUNTER — Ambulatory Visit (INDEPENDENT_AMBULATORY_CARE_PROVIDER_SITE_OTHER): Payer: Medicare Other | Admitting: Emergency Medicine

## 2017-05-13 ENCOUNTER — Encounter: Payer: Self-pay | Admitting: Emergency Medicine

## 2017-05-13 DIAGNOSIS — J449 Chronic obstructive pulmonary disease, unspecified: Secondary | ICD-10-CM

## 2017-05-13 DIAGNOSIS — F17210 Nicotine dependence, cigarettes, uncomplicated: Secondary | ICD-10-CM

## 2017-05-13 DIAGNOSIS — F17200 Nicotine dependence, unspecified, uncomplicated: Secondary | ICD-10-CM

## 2017-05-13 NOTE — Patient Instructions (Addendum)
Please continue your Anoro once a day We will set a goal of decreasing your cigarettes to 15 a day.  Keep albuterol available to use 2 puffs as needed for shortness of breath, wheeze, chest tightness Follow with Dr Lamonte Sakai in 3 months or sooner if you have any problems.

## 2017-05-13 NOTE — Assessment & Plan Note (Signed)
Discussed in more detail today.  She did not tolerate Chantix.  I believe she knows she needs to stop but she needs to be completely motivated to set a quit date.  She is not ready to do this right now.  We need to get her down to a lower number of cigarettes daily.  Once we have done this then we can talk about setting a quit date.  For now our goal will be to go to 15 cigarettes daily.

## 2017-05-13 NOTE — Assessment & Plan Note (Signed)
Appears to have benefited from the Anoro.  She still has some symptoms especially cough.  Relates this largely to her continued smoking.  She only rarely needs albuterol

## 2017-05-13 NOTE — Progress Notes (Signed)
Subjective:    Patient ID: Sheila Morales, female    DOB: 1951/01/07, 67 y.o.   MRN: [id number]  Shortness of Breath  Associated symptoms include a rash. Pertinent negatives include no ear pain, fever, headaches, leg swelling, rhinorrhea, sore throat, vomiting or wheezing. Her past medical history is significant for COPD.  COPD  She complains of shortness of breath. There is no cough or wheezing. Pertinent negatives include no appetite change, ear pain, fever, headaches, postnasal drip, rhinorrhea, sneezing, sore throat or trouble swallowing. Her past medical history is significant for COPD.    67 year old smoker (50 + pack years) with a history of irritable bowel syndrome, lupus on immunosuppression (Plaquenil), history of rheumatic fever, GERD. She was evaluated for cough started about 2 months ago, occasionally prod of clear phlegm. No blood. Was seen by her PCP, ? Had peak flow performed. Was given cough syrup, albuterol, worked temporarily to help cough. She is PPI, does not have any breakthrough GERD sx. No allergies. She was treated 1 month ago with trelegy as a trial, finished a month ago. She believes that her cough may have improved on this. Her breathing has not changed much. She denies any CP. She does not wheeze. No CXR since 2015. She was on prednisone last month for her lupus.   ROV 02/06/17 -- Patient has a history of tobacco, IBS, lupus, esophageal reflux. She is here to follow up her shortness of breath and cough. CXR 01/23/17 with no infiltrates. She underwent PFT today that I have reviewed, show severe obstruction, no BD response, normal volumes, decreased DLCO.  She has trialed trelegy before, felt that she probably benefited.   ROV 05/13/17 --68 year old smoker with severe obstructive lung disease identified by pulmonary function testing.  Also with a history of lupus, reflux, IBS. She started biologic for her SLE w Dr Trudie Reed.  At her last visit we tried starting Anoro to see if she  would benefit.  She also tried taking Chantix to assist with smoking cessation.  Unfortunately she had GI side effects with the Chantix and had to stop it after about 3 weeks.  She is currently smoking approximately 1 pk a day. She believes that she is benefiting from the Anoro - breathing a bit better. She has some cough, non-prod.  She is still working.    Review of Systems  Constitutional: Negative for appetite change, fever and unexpected weight change.  HENT: Negative for congestion, dental problem, ear pain, nosebleeds, postnasal drip, rhinorrhea, sinus pressure, sneezing, sore throat and trouble swallowing.   Eyes: Negative for redness and itching.  Respiratory: Positive for shortness of breath. Negative for cough, chest tightness and wheezing.   Cardiovascular: Negative for palpitations and leg swelling.  Gastrointestinal: Negative for nausea and vomiting.  Genitourinary: Negative for dysuria.  Musculoskeletal: Negative for joint swelling.  Skin: Positive for rash.  Neurological: Negative for headaches.  Hematological: Does not bruise/bleed easily.  Psychiatric/Behavioral: Negative for dysphoric mood. The patient is not nervous/anxious.     Past Medical History:  Diagnosis Date  . Arthritis   . Colitis, ischemic ([place])   . External hemorrhoids   . H/O: rheumatic fever   . IBS (irritable bowel syndrome)   . Personal history colonic adenoma 03/25/2008   06/2007 right sided adenoma and 2 right hyperplastic polyps    . Restless leg syndrome    questionable  . Systemic lupus erythematosus ([place])   . Tubular adenoma of colon   . 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 Stroke Mother        Deceased, 4s  . Atrial fibrillation Father   . Colon cancer Father   . Other Sister        Pick's disease, deceased  . Healthy Daughter      Social History   Socioeconomic History  . Marital status: Married    Spouse name: Not on  file  . Number of children: 2  . Years of education: 80  . Highest education level: Not on file  Social Needs  . Financial resource strain: Not on file  . Food insecurity - worry: Not on file  . Food insecurity - inability: Not on file  . Transportation needs - medical: Not on file  . Transportation needs - non-medical: Not on file  Occupational History  . Occupation: cleaning business  Tobacco Use  . Smoking status: Current Every Day Smoker    Packs/day: 0.50    Years: 30.00    Pack years: 15.00    Types: Cigarettes  . Smokeless tobacco: Never Used  . Tobacco comment: Declined info  Substance and Sexual Activity  . Alcohol use: Yes    Alcohol/week: 3.0 oz    Types: 5 Standard drinks or equivalent per week    Comment: 2 glasses of wine nightly, 4 beers nightly on weekend  . Drug use: No  . Sexual activity: Yes  Other Topics Concern  . Not on file  Social History Narrative   HSG. Married '71. 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      formerly worked Architect, Museum/gallery conservator, currently does home care, house clearing    No Known Allergies   Outpatient Medications Prior to Visit  Medication Sig Dispense Refill  . albuterol (PROVENTIL HFA;VENTOLIN HFA) 108 (90 Base) MCG/ACT inhaler Inhale 2 puffs into the lungs every 6 (six) hours as needed for wheezing or shortness of breath. 1 Inhaler 5  . aspirin 81 MG tablet Take 81 mg by mouth daily.    . cholecalciferol (VITAMIN D) 1000 UNITS tablet Take 1,000 Units by mouth daily.      . colestipol (COLESTID) 5 G granules 1 scoop alternating with 1/2 scoop 500 g 12  . gabapentin (NEURONTIN) 300 MG capsule Start taking 1 tab in the morning and afternoon and 3 tablets at bedtime.  OK to take extra dose as needed. (Patient taking differently: 3 (three) times daily. 2 po qam, 2 po afternoon, 3 po qhs) 540 capsule 3  .  guaiFENesin-dextromethorphan (ROBITUSSIN DM) 100-10 MG/5ML syrup Take 5 mLs by mouth every 4 (four) hours as needed for cough. 118 mL 0  . hydroxychloroquine (PLAQUENIL) 200 MG tablet Take 200 mg by mouth 2 (two) times daily.    . MULTIPLE VITAMIN PO Take 1 tablet by mouth daily.      Marland Kitchen omeprazole (PRILOSEC) 40 MG capsule Take 1 capsule (40 mg total) by mouth daily before breakfast. 30 capsule 11  . umeclidinium-vilanterol (ANORO ELLIPTA) 62.5-25 MCG/INH AEPB Inhale 1 puff into the lungs daily. 60 each 5  . UNABLE TO FIND Med Name: study drug, not known what it is.    . vitamin C (ASCORBIC ACID) 500 MG tablet Take 500 mg by mouth daily.      Marland Kitchen XIIDRA 5 % SOLN INSTILL 1 DROP INTO BOTH EYES TWICE A DAY  6  . zolpidem (AMBIEN) 5 MG tablet Take 1 tablet (5 mg total) by mouth at bedtime as needed. 30 tablet 1  . nortriptyline (PAMELOR) 10 MG capsule Take 1 capsule (10 mg total) by mouth at bedtime. 1 capsule (10 mg) by mouth at bedtime x 2 weeks then 2 capsules (20 mg) by mouth at bedtime (Patient not taking: Reported on 02/24/2017) 60 capsule 5  . varenicline (CHANTIX CONTINUING MONTH PAK) 1 MG tablet Take 1 tablet (1 mg total) 2 (two) times daily by mouth. 60 tablet 3  . varenicline (CHANTIX STARTING MONTH PAK) 0.5 MG X 11 & 1 MG X 42 tablet Take one 0.5 mg tablet by mouth once daily for 3 days, then increase to one 0.5 mg tablet twice daily for 4 days, then increase to one 1 mg tablet twice daily. 53 tablet 0   No facility-administered medications prior to visit.         Objective:   Physical Exam Vitals:   05/13/17 1330 05/13/17 1331  BP:  116/70  Pulse:  99  SpO2:  94%  Weight: 137 lb (62.1 kg)    Gen: Pleasant, well-nourished, in no distress,  normal affect  ENT: No lesions,  mouth clear,  oropharynx clear, no postnasal drip  Neck: No JVD, no Stridor  Lungs: No use of accessory muscles,  clear without rales or rhonchi  Cardiovascular: RRR, heart sounds normal, no murmur or gallops,  no peripheral edema  Musculoskeletal: No deformities, no cyanosis or clubbing  Neuro: alert, non focal  Skin: Scattered red macular lesions on the arms, legs, back with various degrees of resolution related to her lupus     Assessment & Plan:  COPD (chronic obstructive pulmonary disease) (HCC) Appears to have benefited from the Anoro.  She still has some symptoms especially cough.  Relates this largely to her continued smoking.  She only rarely needs albuterol  TOBACCO USE DISORDER/SMOKER-SMOKING CESSATION DISCUSSED Discussed in more detail today.  She did not tolerate Chantix.  I believe she knows she needs to stop but she needs to be completely motivated to set a quit date.  She is not ready to do this right now.  We need to get her down to a lower number of cigarettes daily.  Once we have done this then we can talk about setting a quit date.  For now our goal will be to go to 15 cigarettes daily.  Baltazar Apo, MD, PhD 05/13/2017, 1:47 PM [facility] [phone] or if no answer [phone]

## 2017-05-19 DIAGNOSIS — H903 Sensorineural hearing loss, bilateral: Secondary | ICD-10-CM | POA: Diagnosis not present

## 2017-05-26 DIAGNOSIS — M329 Systemic lupus erythematosus, unspecified: Secondary | ICD-10-CM | POA: Diagnosis not present

## 2017-05-27 ENCOUNTER — Telehealth: Payer: Self-pay | Admitting: Emergency Medicine

## 2017-05-27 NOTE — Telephone Encounter (Signed)
Spoke with pt. She wanted to let us know the name of the injection she is taking for her Lupus. This has been added to her active medication list. Nothing further was needed.

## 2017-06-16 ENCOUNTER — Ambulatory Visit: Payer: Medicare Other | Admitting: Neurology

## 2017-07-08 ENCOUNTER — Ambulatory Visit
Admission: RE | Admit: 2017-07-08 | Discharge: 2017-07-08 | Disposition: A | Discharge status: Home / Self Care | Payer: Medicare Other | Source: Ambulatory Visit | Attending: Internal Medicine | Admitting: Internal Medicine

## 2017-07-08 DIAGNOSIS — Z1231 Encounter for screening mammogram for malignant neoplasm of breast: Secondary | ICD-10-CM

## 2017-07-15 ENCOUNTER — Other Ambulatory Visit: Payer: Self-pay | Admitting: Internal Medicine

## 2017-07-15 DIAGNOSIS — E663 Overweight: Secondary | ICD-10-CM | POA: Diagnosis not present

## 2017-07-15 DIAGNOSIS — M2550 Pain in unspecified joint: Secondary | ICD-10-CM | POA: Diagnosis not present

## 2017-07-15 DIAGNOSIS — Z6825 Body mass index (BMI) 25.0-25.9, adult: Secondary | ICD-10-CM | POA: Diagnosis not present

## 2017-07-15 DIAGNOSIS — Z79899 Other long term (current) drug therapy: Secondary | ICD-10-CM | POA: Diagnosis not present

## 2017-07-15 DIAGNOSIS — M328 Other forms of systemic lupus erythematosus: Secondary | ICD-10-CM | POA: Diagnosis not present

## 2017-07-15 DIAGNOSIS — L932 Other local lupus erythematosus: Secondary | ICD-10-CM | POA: Diagnosis not present

## 2017-07-15 NOTE — Telephone Encounter (Signed)
Control database checked last refill:06/09/2017  LOV: 02/24/2017

## 2017-07-28 DIAGNOSIS — L720 Epidermal cyst: Secondary | ICD-10-CM | POA: Diagnosis not present

## 2017-07-28 DIAGNOSIS — L821 Other seborrheic keratosis: Secondary | ICD-10-CM | POA: Diagnosis not present

## 2017-07-28 DIAGNOSIS — D225 Melanocytic nevi of trunk: Secondary | ICD-10-CM | POA: Diagnosis not present

## 2017-07-28 DIAGNOSIS — L930 Discoid lupus erythematosus: Secondary | ICD-10-CM | POA: Diagnosis not present

## 2017-07-29 ENCOUNTER — Ambulatory Visit (INDEPENDENT_AMBULATORY_CARE_PROVIDER_SITE_OTHER): Payer: Medicare Other | Admitting: Emergency Medicine

## 2017-07-29 ENCOUNTER — Encounter: Payer: Self-pay | Admitting: Emergency Medicine

## 2017-07-29 DIAGNOSIS — F17200 Nicotine dependence, unspecified, uncomplicated: Secondary | ICD-10-CM | POA: Diagnosis not present

## 2017-07-29 DIAGNOSIS — J449 Chronic obstructive pulmonary disease, unspecified: Secondary | ICD-10-CM | POA: Diagnosis not present

## 2017-07-29 NOTE — Patient Instructions (Signed)
Please continue Anoro as you are taking it Keep albuterol available to use up to every 4 hours if needed for shortness of breath Congratulations on decreasing your cigarettes.  Our new goal will be to cut down to 10 cigarettes daily and to maintain at that level until our follow-up visit. Follow with Dr Lamonte Sakai in 6 months or sooner if you have any problems

## 2017-07-29 NOTE — Assessment & Plan Note (Signed)
Please continue Anoro as you are taking it Keep albuterol available to use up to every 4 hours if needed for shortness of breath Follow with Dr Lamonte Sakai in 6 months or sooner if you have any problems

## 2017-07-29 NOTE — Progress Notes (Signed)
Subjective:    Patient ID: Sheila Morales, female    DOB: 05/24/1950, 67 y.o.   MRN: [id number]  COPD  She complains of shortness of breath. There is no cough or wheezing. Pertinent negatives include no appetite change, ear pain, fever, headaches, postnasal drip, rhinorrhea, sneezing, sore throat or trouble swallowing. Her past medical history is significant for COPD.  Shortness of Breath  Associated symptoms include a rash. Pertinent negatives include no ear pain, fever, headaches, leg swelling, rhinorrhea, sore throat, vomiting or wheezing. Her past medical history is significant for COPD.    67 year old smoker (50 + pack years) with a history of irritable bowel syndrome, lupus on immunosuppression (Plaquenil), history of rheumatic fever, GERD. She was evaluated for cough started about 2 months ago, occasionally prod of clear phlegm. No blood. Was seen by her PCP, ? Had peak flow performed. Was given cough syrup, albuterol, worked temporarily to help cough. She is PPI, does not have any breakthrough GERD sx. No allergies. She was treated 1 month ago with trelegy as a trial, finished a month ago. She believes that her cough may have improved on this. Her breathing has not changed much. She denies any CP. She does not wheeze. No CXR since 2015. She was on prednisone last month for her lupus.   ROV 02/06/17 -- Patient has a history of tobacco, IBS, lupus, esophageal reflux. She is here to follow up her shortness of breath and cough. CXR 01/23/17 with no infiltrates. She underwent PFT today that I have reviewed, show severe obstruction, no BD response, normal volumes, decreased DLCO.  She has trialed trelegy before, felt that she probably benefited.   ROV 05/13/17 --67 year old smoker with severe obstructive lung disease identified by pulmonary function testing.  Also with a history of lupus, reflux, IBS. She started biologic for her SLE w Dr Trudie Reed.  At her last visit we tried starting Anoro to see if she  would benefit.  She also tried taking Chantix to assist with smoking cessation.  Unfortunately she had GI side effects with the Chantix and had to stop it after about 3 weeks.  She is currently smoking approximately 1 pk a day. She believes that she is benefiting from the Anoro - breathing a bit better. She has some cough, non-prod.  She is still working.   ROV 07/29/17 --this is a follow-up visit for patient with a history of tobacco use and associated severe obstructive lung disease.  She also has SLE, esophageal reflux and irritable bowel syndrome.  She is followed by Dr. Trudie Reed and is being managed on belimumab (Benlysta), Plaquenil, Colestid. We have been managing her w Anoro. She has a hard time telling whether it is helping her much. She uses albuterol occasionally, needed it when she worked in the yard recently.  No pred since last time. She is smoking about 15 cig a day.   She has been dealing with an abscessed tooth, root canal.    Review of Systems  Constitutional: Negative for appetite change, fever and unexpected weight change.  HENT: Negative for congestion, dental problem, ear pain, nosebleeds, postnasal drip, rhinorrhea, sinus pressure, sneezing, sore throat and trouble swallowing.   Eyes: Negative for redness and itching.  Respiratory: Positive for shortness of breath. Negative for cough, chest tightness and wheezing.   Cardiovascular: Negative for palpitations and leg swelling.  Gastrointestinal: Negative for nausea and vomiting.  Genitourinary: Negative for dysuria.  Musculoskeletal: Negative for joint swelling.  Skin: Positive for rash.  Neurological: Negative for headaches.  Hematological: Does not bruise/bleed easily.  Psychiatric/Behavioral: Negative for dysphoric mood. The patient is not nervous/anxious.     Past Medical History:  Diagnosis Date  . Arthritis   . Colitis, ischemic ([place])   . External hemorrhoids   . H/O: rheumatic fever   . IBS (irritable bowel  syndrome)   . Personal history colonic adenoma 03/25/2008   06/2007 right sided adenoma and 2 right hyperplastic polyps    . Restless leg syndrome    questionable  . Systemic lupus erythematosus ([place])   . Tubular adenoma of colon   . 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 Stroke Mother        Deceased, 44s  . Atrial fibrillation Father   . Colon cancer Father   . Other Sister        Pick's disease, deceased  . Healthy Daughter      Social History   Socioeconomic History  . Marital status: Married    Spouse name: Not on file  . Number of children: 2  . Years of education: 42  . Highest education level: Not on file  Occupational History  . Occupation: Education administrator business  Social Needs  . Financial resource strain: Not on file  . Food insecurity:    Worry: Not on file    Inability: Not on file  . Transportation needs:    Medical: Not on file    Non-medical: Not on file  Tobacco Use  . Smoking status: Current Every Day Smoker    Packs/day: 0.50    Years: 30.00    Pack years: 15.00    Types: Cigarettes  . Smokeless tobacco: Never Used  . Tobacco comment: Declined info  Substance and Sexual Activity  . Alcohol use: Yes    Alcohol/week: 3.0 oz    Types: 5 Standard drinks or equivalent per week    Comment: 2 glasses of wine nightly, 4 beers nightly on weekend  . Drug use: No  . Sexual activity: Yes  Lifestyle  . Physical activity:    Days per week: Not on file    Minutes per session: Not on file  . Stress: Not on file  Relationships  . Social connections:    Talks on phone: Not on file    Gets together: Not on file    Attends religious service: Not on file    Active member of club or organization: Not on file    Attends meetings of clubs or organizations: Not on file    Relationship status: Not on file  . Intimate partner violence:    Fear of current or ex partner: Not on file    Emotionally abused: Not on  file    Physically abused: Not on file    Forced sexual activity: Not on file  Other Topics Concern  . Not on file  Social History Narrative   HSG. Married '71. 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      formerly worked Architect, Museum/gallery conservator, currently does home care, house clearing    No Known Allergies   Outpatient Medications Prior to Visit  Medication Sig Dispense Refill  . albuterol (PROVENTIL HFA;VENTOLIN HFA) 108 (90 Base) MCG/ACT inhaler Inhale 2 puffs into the lungs every 6 (six) hours as needed for wheezing or shortness of breath. 1  Inhaler 5  . aspirin 81 MG tablet Take 81 mg by mouth daily.    . Belimumab (BENLYSTA) 200 MG/ML SOAJ Inject 1 mL into the skin every 14 (fourteen) days.    . cholecalciferol (VITAMIN D) 1000 UNITS tablet Take 1,000 Units by mouth daily.      . colestipol (COLESTID) 5 G granules 1 scoop alternating with 1/2 scoop 500 g 12  . gabapentin (NEURONTIN) 300 MG capsule Start taking 1 tab in the morning and afternoon and 3 tablets at bedtime.  OK to take extra dose as needed. (Patient taking differently: 3 (three) times daily. 2 po qam, 2 po afternoon, 3 po qhs) 540 capsule 3  . guaiFENesin-dextromethorphan (ROBITUSSIN DM) 100-10 MG/5ML syrup Take 5 mLs by mouth every 4 (four) hours as needed for cough. 118 mL 0  . hydroxychloroquine (PLAQUENIL) 200 MG tablet Take 200 mg by mouth 2 (two) times daily.    . MULTIPLE VITAMIN PO Take 1 tablet by mouth daily.      Marland Kitchen omeprazole (PRILOSEC) 40 MG capsule Take 1 capsule (40 mg total) by mouth daily before breakfast. 30 capsule 11  . umeclidinium-vilanterol (ANORO ELLIPTA) 62.5-25 MCG/INH AEPB Inhale 1 puff into the lungs daily. 60 each 5  . UNABLE TO FIND Med Name: study drug, not known what it is.    . vitamin C (ASCORBIC ACID) 500 MG tablet Take 500 mg by mouth daily.      Marland Kitchen XIIDRA 5 % SOLN INSTILL 1 DROP INTO BOTH  EYES TWICE A DAY  6  . zolpidem (AMBIEN) 5 MG tablet TAKE 1 TABLET (5 MG TOTAL) BY MOUTH AT BEDTIME AS NEEDED. 30 tablet 4   No facility-administered medications prior to visit.         Objective:   Physical Exam Vitals:   07/29/17 1325  BP: 124/74  Pulse: 95  SpO2: 98%  Weight: 138 lb (62.6 kg)  Height: 5' 2.5" (1.588 m)   Gen: Pleasant, well-nourished, in no distress,  normal affect  ENT: No lesions,  mouth clear,  oropharynx clear, no postnasal drip  Neck: No JVD, no Stridor  Lungs: No use of accessory muscles,  clear without rales or rhonchi  Cardiovascular: RRR, heart sounds normal, no murmur or gallops, no peripheral edema  Musculoskeletal: No deformities, no cyanosis or clubbing  Neuro: alert, non focal  Skin: Scattered red macular lesions on the arms, legs, back with various degrees of resolution related to her lupus     Assessment & Plan:  COPD (chronic obstructive pulmonary disease) (HCC) Please continue Anoro as you are taking it Keep albuterol available to use up to every 4 hours if needed for shortness of breath Follow with Dr Lamonte Sakai in 6 months or sooner if you have any problems  TOBACCO USE DISORDER/SMOKER-SMOKING CESSATION DISCUSSED Congratulations on decreasing your cigarettes.  Our new goal will be to cut down to 10 cigarettes daily and to maintain at that level until our follow-up visit.  Baltazar Apo, MD, PhD 07/29/2017, 1:50 PM [facility] [phone] or if no answer [phone]

## 2017-07-29 NOTE — Assessment & Plan Note (Signed)
Congratulations on decreasing your cigarettes.  Our new goal will be to cut down to 10 cigarettes daily and to maintain at that level until our follow-up visit.

## 2017-07-30 DIAGNOSIS — M329 Systemic lupus erythematosus, unspecified: Secondary | ICD-10-CM | POA: Diagnosis not present

## 2017-07-30 DIAGNOSIS — H3509 Other intraretinal microvascular abnormalities: Secondary | ICD-10-CM | POA: Diagnosis not present

## 2017-07-30 DIAGNOSIS — H25013 Cortical age-related cataract, bilateral: Secondary | ICD-10-CM | POA: Diagnosis not present

## 2017-07-30 DIAGNOSIS — H2513 Age-related nuclear cataract, bilateral: Secondary | ICD-10-CM | POA: Diagnosis not present

## 2017-07-30 DIAGNOSIS — Z79899 Other long term (current) drug therapy: Secondary | ICD-10-CM | POA: Diagnosis not present

## 2017-08-04 ENCOUNTER — Other Ambulatory Visit: Payer: Self-pay | Admitting: Emergency Medicine

## 2017-08-11 DIAGNOSIS — M328 Other forms of systemic lupus erythematosus: Secondary | ICD-10-CM | POA: Diagnosis not present

## 2017-09-01 ENCOUNTER — Telehealth: Payer: Self-pay | Admitting: Emergency Medicine

## 2017-09-01 NOTE — Telephone Encounter (Signed)
Called and spoke with patient, she states that with her insurance covering so much of her Anoro cost, by August she will be in the donut hole and will have to pay out of pocket. She has asked to switch to another inhaler that is cheaper for her insurance. I advised patient to contact her insurance company to see what options she has as far as inhalers. She is doing that and will call us back to let us know and we can go from there.

## 2017-09-16 DIAGNOSIS — Z79899 Other long term (current) drug therapy: Secondary | ICD-10-CM | POA: Diagnosis not present

## 2017-09-16 DIAGNOSIS — M329 Systemic lupus erythematosus, unspecified: Secondary | ICD-10-CM | POA: Diagnosis not present

## 2017-09-18 ENCOUNTER — Ambulatory Visit (INDEPENDENT_AMBULATORY_CARE_PROVIDER_SITE_OTHER)
Admission: RE | Admit: 2017-09-18 | Discharge: 2017-09-18 | Disposition: A | Discharge status: Home / Self Care | Payer: Medicare Other | Source: Ambulatory Visit | Attending: Family | Admitting: Family

## 2017-09-18 ENCOUNTER — Encounter: Payer: Self-pay | Admitting: Family

## 2017-09-18 ENCOUNTER — Ambulatory Visit (INDEPENDENT_AMBULATORY_CARE_PROVIDER_SITE_OTHER): Payer: Medicare Other | Admitting: Family

## 2017-09-18 ENCOUNTER — Other Ambulatory Visit (INDEPENDENT_AMBULATORY_CARE_PROVIDER_SITE_OTHER): Payer: Medicare Other

## 2017-09-18 VITALS — BP 122/70 | HR 97 | Temp 98.6°F | Ht 62.5 in | Wt 134.1 lb

## 2017-09-18 DIAGNOSIS — R1030 Lower abdominal pain, unspecified: Secondary | ICD-10-CM

## 2017-09-18 DIAGNOSIS — R109 Unspecified abdominal pain: Secondary | ICD-10-CM

## 2017-09-18 LAB — COMPREHENSIVE METABOLIC PANEL
ALK PHOS: 78 U/L (ref 39–117)
ALT: 27 U/L (ref 0–35)
AST: 29 U/L (ref 0–37)
Albumin: 4.1 g/dL (ref 3.5–5.2)
BILIRUBIN TOTAL: 0.3 mg/dL (ref 0.2–1.2)
BUN: 12 mg/dL (ref 6–23)
CO2: 30 mEq/L (ref 19–32)
CREATININE: 0.7 mg/dL (ref 0.40–1.20)
Calcium: 9.3 mg/dL (ref 8.4–10.5)
Chloride: 97 mEq/L (ref 96–112)
GFR: 88.48 mL/min (ref >60.00)
GLUCOSE: 77 mg/dL (ref 70–99)
POTASSIUM: 3.9 meq/L (ref 3.5–5.1)
SODIUM: 132 meq/L — AB (ref 135–145)
TOTAL PROTEIN: 7.5 g/dL (ref 6.0–8.3)

## 2017-09-18 LAB — CBC WITH DIFFERENTIAL/PLATELET
Basophils Absolute: 0.1 10*3/uL (ref 0.0–0.1)
Basophils Relative: 1.6 % (ref 0.0–3.0)
EOS ABS: 0.1 10*3/uL (ref 0.0–0.7)
Eosinophils Relative: 1.3 % (ref 0.0–5.0)
HCT: 36.9 % (ref 36.0–46.0)
Hemoglobin: 12.8 g/dL (ref 12.0–15.0)
LYMPHS ABS: 1.4 10*3/uL (ref 0.7–4.0)
Lymphocytes Relative: 34 % (ref 12.0–46.0)
MCHC: 34.7 g/dL (ref 30.0–36.0)
MCV: 89.7 fl (ref 78.0–100.0)
MONO ABS: 0.8 10*3/uL (ref 0.1–1.0)
Monocytes Relative: 20.5 % — ABNORMAL HIGH (ref 3.0–12.0)
NEUTROS PCT: 42.6 % — AB (ref 43.0–77.0)
Neutro Abs: 1.7 10*3/uL (ref 1.4–7.7)
Platelets: 197 10*3/uL (ref 150.0–400.0)
RBC: 4.12 Mil/uL (ref 3.87–5.11)
RDW: 13.2 % (ref 11.5–15.5)
WBC: 4 10*3/uL (ref 4.0–10.5)

## 2017-09-18 MED ORDER — METRONIDAZOLE 500 MG PO TABS: 500.0000 mg | ORAL_TABLET | Freq: Three times a day (TID) | ORAL | 0 refills | Status: DC

## 2017-09-18 MED ORDER — CIPROFLOXACIN HCL 500 MG PO TABS: 500.0000 mg | ORAL_TABLET | Freq: Two times a day (BID) | ORAL | 0 refills | Status: DC

## 2017-09-18 NOTE — Progress Notes (Signed)
Sheila Morales is a 67 y.o. female with the following history as recorded in EpicCare:  Patient Active Problem List   Diagnosis Date Noted  . Insomnia 02/24/2017  . COPD (chronic obstructive pulmonary disease) ([place]) 01/22/2017  . Routine general medical examination at a health care facility 02/19/2016  . Hereditary and idiopathic peripheral neuropathy 02/17/2015  . Smokers' cough ([place]) 12/07/2014  . Systemic lupus erythematosus ([place]) 10/13/2013  . TOBACCO USE DISORDER/SMOKER-SMOKING CESSATION DISCUSSED 01/02/2009  . Irritable bowel syndrome 03/25/2008  . RHEUMATIC FEVER, HX OF 01/17/2007    Current Outpatient Medications  Medication Sig Dispense Refill  . albuterol (PROVENTIL HFA;VENTOLIN HFA) 108 (90 Base) MCG/ACT inhaler Inhale 2 puffs into the lungs every 6 (six) hours as needed for wheezing or shortness of breath. 1 Inhaler 5  . ANORO ELLIPTA 62.5-25 MCG/INH AEPB TAKE 1 PUFF BY MOUTH EVERY DAY 60 each 5  . aspirin 81 MG tablet Take 81 mg by mouth daily.    . Belimumab (BENLYSTA) 200 MG/ML SOAJ Inject 1 mL into the skin every 14 (fourteen) days.    . cholecalciferol (VITAMIN D) 1000 UNITS tablet Take 1,000 Units by mouth daily.      . colestipol (COLESTID) 5 G granules 1 scoop alternating with 1/2 scoop 500 g 12  . gabapentin (NEURONTIN) 300 MG capsule Start taking 1 tab in the morning and afternoon and 3 tablets at bedtime.  OK to take extra dose as needed. (Patient taking differently: 3 (three) times daily. 2 po qam, 2 po afternoon, 3 po qhs) 540 capsule 3  . guaiFENesin-dextromethorphan (ROBITUSSIN DM) 100-10 MG/5ML syrup Take 5 mLs by mouth every 4 (four) hours as needed for cough. 118 mL 0  . hydroxychloroquine (PLAQUENIL) 200 MG tablet Take 200 mg by mouth 2 (two) times daily.    . MULTIPLE VITAMIN PO Take 1 tablet by mouth daily.      Marland Kitchen omeprazole (PRILOSEC) 40 MG capsule Take 1 capsule (40 mg total) by mouth daily before breakfast. 30 capsule 11  . RESTASIS 0.05 % ophthalmic  emulsion INSTILL 1 DROP INTO BOTH EYES TWICE A DAY  6  . triamcinolone cream (KENALOG) 0.1 % APPLY TO AFFECTED AREA TWICE A DAY FOR 2 WEEK AS DIRECTED  0  . UNABLE TO FIND Med Name: study drug, not known what it is.    . vitamin C (ASCORBIC ACID) 500 MG tablet Take 500 mg by mouth daily.      Marland Kitchen zolpidem (AMBIEN) 5 MG tablet TAKE 1 TABLET (5 MG TOTAL) BY MOUTH AT BEDTIME AS NEEDED. 30 tablet 4  . ciprofloxacin (CIPRO) 500 MG tablet Take 1 tablet (500 mg total) by mouth 2 (two) times daily. 20 tablet 0  . metroNIDAZOLE (FLAGYL) 500 MG tablet Take 1 tablet (500 mg total) by mouth 3 (three) times daily. 21 tablet 0  . XIIDRA 5 % SOLN INSTILL 1 DROP INTO BOTH EYES TWICE A DAY  6   No current facility-administered medications for this visit.     Allergies: Patient has no known allergies.  Past Medical History:  Diagnosis Date  . Arthritis   . Colitis, ischemic ([place])   . External hemorrhoids   . H/O: rheumatic fever   . IBS (irritable bowel syndrome)   . Personal history colonic adenoma 03/25/2008   06/2007 right sided adenoma and 2 right hyperplastic polyps    . Restless leg syndrome    questionable  . Systemic lupus erythematosus ([place])   . Tubular adenoma of colon   .  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 CHOLECYSTECTOMY    . CHOLECYSTECTOMY, LAPAROSCOPIC    . COLONOSCOPY    . ESOPHAGOGASTRODUODENOSCOPY    . lesion, vulva excision    . TONSILLECTOMY      Family History  Problem Relation Age of Onset  . Stroke Mother        Deceased, 6s  . Atrial fibrillation Father   . Colon cancer Father   . Other Sister        Pick's disease, deceased  . Healthy Daughter     Social History   Tobacco Use  . Smoking status: Current Every Day Smoker    Packs/day: 0.50    Years: 30.00    Pack years: 15.00    Types: Cigarettes  . Smokeless tobacco: Never Used  . Tobacco comment: Declined info  Substance Use Topics  . Alcohol use:  Yes    Alcohol/week: 3.0 oz    Types: 5 Standard drinks or equivalent per week    Comment: 2 glasses of wine nightly, 4 beers nightly on weekend    Subjective:  Patient presents with concerns for episodes of abdominal pain/ dry heaves- started suddenly last night; notes that pain kept keep her awake last night; has been able to eat today and keep food down today; no blood in the stool, no coffee grounds emesis; did eat lunch out yesterday- sauerkraut/ hot dogs/ husband ate same food with no symptoms; + gallbladder has been removed; colonoscopy done in 2016; did show moderate diverticulosis on colonoscopy in 2016- no prior history of diverticulitis; notes abdominal pain feels localized in lower abdomen but not localized over left lower quadrant; does have IBS but states this is not how symptoms typically present; no changes in urinary symptoms.   Objective:  Vitals:   09/18/17 1436  BP: 122/70  Pulse: 97  Temp: 98.6 F (37 C)  TempSrc: Oral  SpO2: 98%  Weight: 134 lb 1.9 oz (60.8 kg)  Height: 5' 2.5" (1.588 m)    General: Well developed, well nourished, in no acute distress  Skin : Warm and dry.  Head: Normocephalic and atraumatic  Lungs: Respirations unlabored; clear to auscultation bilaterally without wheeze, rales, rhonchi  CVS exam: normal rate and regular rhythm.  Abdomen: Soft; nontender; nondistended; normoactive bowel sounds; no masses or hepatosplenomegaly  Neurologic: Alert and oriented; speech intact; face symmetrical; moves all extremities well; CNII-XII intact without focal deficit  Assessment:  1. Abdominal pain, unspecified abdominal location     Plan:  ? Diverticulitis; check CBC, CMP and abdominal X-ray; unable to get CT until Friday, May 31- strict ER precautions discussed; Rx for Cipro and Flagyl; follow-up to be determined.    No follow-ups on file.  Orders Placed This Encounter  Procedures  . DG Abd 2 Views    Standing Status:   Future    Number of  Occurrences:   1    Standing Expiration Date:   11/19/2018    Order Specific Question:   Reason for Exam (SYMPTOM  OR DIAGNOSIS REQUIRED)    Answer:   abdominal pain    Order Specific Question:   Preferred imaging location?    Answer:   [facility]    Order Specific Question:   Radiology Contrast Protocol - do NOT remove file path    Answer:   [email or web address]  . CBC w/Diff    Standing Status:   Future  Number of Occurrences:   1    Standing Expiration Date:   09/18/2018  . Comp Met (CMET)    Standing Status:   Future    Number of Occurrences:   1    Standing Expiration Date:   09/18/2018    Requested Prescriptions   Signed Prescriptions Disp Refills  . ciprofloxacin (CIPRO) 500 MG tablet 20 tablet 0    Sig: Take 1 tablet (500 mg total) by mouth 2 (two) times daily.  . metroNIDAZOLE (FLAGYL) 500 MG tablet 21 tablet 0    Sig: Take 1 tablet (500 mg total) by mouth 3 (three) times daily.

## 2017-09-19 ENCOUNTER — Telehealth: Payer: Self-pay | Admitting: Family

## 2017-09-19 ENCOUNTER — Ambulatory Visit
Admission: RE | Admit: 2017-09-19 | Discharge: 2017-09-19 | Disposition: A | Discharge status: Home / Self Care | Payer: Medicare Other | Source: Ambulatory Visit | Attending: Family | Admitting: Family

## 2017-09-19 DIAGNOSIS — R109 Unspecified abdominal pain: Secondary | ICD-10-CM | POA: Diagnosis not present

## 2017-09-19 DIAGNOSIS — R1030 Lower abdominal pain, unspecified: Secondary | ICD-10-CM

## 2017-09-19 MED ORDER — IOPAMIDOL (ISOVUE-300) INJECTION 61%
100.0000 mL | Freq: Once | INTRAVENOUS | Status: AC | PRN
  Administered 2017-09-19: 100 mL via INTRAVENOUS

## 2017-09-19 NOTE — Telephone Encounter (Signed)
Copied from [place] [id number]. Topic: Quick Communication - See Telephone Encounter >> Sep 19, 2017  1:12 PM Keene Breath wrote: CRM for notification. See Telephone encounter for: 09/19/17.  The radiology with [facility] called to let Jodi Mourning know that her  CT results are available in Epic.

## 2017-09-22 ENCOUNTER — Ambulatory Visit (INDEPENDENT_AMBULATORY_CARE_PROVIDER_SITE_OTHER): Payer: Medicare Other | Admitting: Nurse Practitioner

## 2017-09-22 ENCOUNTER — Encounter: Payer: Self-pay | Admitting: Nurse Practitioner

## 2017-09-22 VITALS — BP 156/82 | HR 87 | Temp 98.4°F | Resp 18 | Ht 62.5 in | Wt 134.4 lb

## 2017-09-22 DIAGNOSIS — R197 Diarrhea, unspecified: Secondary | ICD-10-CM

## 2017-09-22 NOTE — Progress Notes (Signed)
Name: ZANIYA MCAULAY   MRN: [id number]    DOB: 02-Sep-1950   Date:09/22/2017       Progress Note  Subjective  Chief Complaint  Chief Complaint  Patient presents with  . Diarrhea    having GI problems, Diarrhea all day yesterday and some today    HPI  Ms Duda is here today for further evaluation of abdominal pain, first seen for this on 09/18/17 by another provider in our clinic. On 5/30 visit, CBC, CMET,  2 view abdomen and CT abd /pelvis were ordered with no remarkable findings for acute process. She was started on a course of cipro and flagyl on 5/30 for possible diverticulitis, which was not seen on 5/31 CT scan of her abdomen. She has been taking both cipro and flagyl as prescribed starting on 5/30. She is back today with continued generalized abdominal pain and now requesting evaluation of acute diarrhea that began on this past Saturday 6/1. She had diarrhea 8 times yesterday, about 2- 3 times today. She describes as watery stool every time she uses restroom. She reports gas, decreased appetite. She denies fevers, syncope, dizziness, nausea, vomiting, urinary changes , rectal bleeding. She is tolerating fluids and has been trying to stay hydrated. Hx IBS- was followed by GI in past but has been stable and no recent routine follow up with GI  Patient Active Problem List   Diagnosis Date Noted  . Insomnia 02/24/2017  . COPD (chronic obstructive pulmonary disease) ([place]) 01/22/2017  . Routine general medical examination at a health care facility 02/19/2016  . Hereditary and idiopathic peripheral neuropathy 02/17/2015  . Smokers' cough ([place]) 12/07/2014  . Systemic lupus erythematosus ([place]) 10/13/2013  . TOBACCO USE DISORDER/SMOKER-SMOKING CESSATION DISCUSSED 01/02/2009  . Irritable bowel syndrome 03/25/2008  . RHEUMATIC FEVER, HX OF 01/17/2007    Social History   Tobacco Use  . Smoking status: Current Every Day Smoker    Packs/day: 0.50    Years: 30.00    Pack years: 15.00   Types: Cigarettes  . Smokeless tobacco: Never Used  . Tobacco comment: Declined info  Substance Use Topics  . Alcohol use: Yes    Alcohol/week: 3.0 oz    Types: 5 Standard drinks or equivalent per week    Comment: 2 glasses of wine nightly, 4 beers nightly on weekend     Current Outpatient Medications:  .  albuterol (PROVENTIL HFA;VENTOLIN HFA) 108 (90 Base) MCG/ACT inhaler, Inhale 2 puffs into the lungs every 6 (six) hours as needed for wheezing or shortness of breath., Disp: 1 Inhaler, Rfl: 5 .  ANORO ELLIPTA 62.5-25 MCG/INH AEPB, TAKE 1 PUFF BY MOUTH EVERY DAY, Disp: 60 each, Rfl: 5 .  aspirin 81 MG tablet, Take 81 mg by mouth daily., Disp: , Rfl:  .  Belimumab (BENLYSTA) 200 MG/ML SOAJ, Inject 1 mL into the skin every 14 (fourteen) days., Disp: , Rfl:  .  cholecalciferol (VITAMIN D) 1000 UNITS tablet, Take 1,000 Units by mouth daily.  , Disp: , Rfl:  .  ciprofloxacin (CIPRO) 500 MG tablet, Take 1 tablet (500 mg total) by mouth 2 (two) times daily., Disp: 20 tablet, Rfl: 0 .  colestipol (COLESTID) 5 G granules, 1 scoop alternating with 1/2 scoop, Disp: 500 g, Rfl: 12 .  gabapentin (NEURONTIN) 300 MG capsule, Start taking 1 tab in the morning and afternoon and 3 tablets at bedtime.  OK to take extra dose as needed. (Patient taking differently: 3 (three) times daily. 2 po qam, 2 po  afternoon, 3 po qhs), Disp: 540 capsule, Rfl: 3 .  guaiFENesin-dextromethorphan (ROBITUSSIN DM) 100-10 MG/5ML syrup, Take 5 mLs by mouth every 4 (four) hours as needed for cough., Disp: 118 mL, Rfl: 0 .  hydroxychloroquine (PLAQUENIL) 200 MG tablet, Take 200 mg by mouth 2 (two) times daily., Disp: , Rfl:  .  metroNIDAZOLE (FLAGYL) 500 MG tablet, Take 1 tablet (500 mg total) by mouth 3 (three) times daily., Disp: 21 tablet, Rfl: 0 .  MULTIPLE VITAMIN PO, Take 1 tablet by mouth daily.  , Disp: , Rfl:  .  omeprazole (PRILOSEC) 40 MG capsule, Take 1 capsule (40 mg total) by mouth daily before breakfast., Disp: 30  capsule, Rfl: 11 .  RESTASIS 0.05 % ophthalmic emulsion, INSTILL 1 DROP INTO BOTH EYES TWICE A DAY, Disp: , Rfl: 6 .  triamcinolone cream (KENALOG) 0.1 %, APPLY TO AFFECTED AREA TWICE A DAY FOR 2 WEEK AS DIRECTED, Disp: , Rfl: 0 .  UNABLE TO FIND, Med Name: study drug, not known what it is., Disp: , Rfl:  .  vitamin C (ASCORBIC ACID) 500 MG tablet, Take 500 mg by mouth daily.  , Disp: , Rfl:  .  zolpidem (AMBIEN) 5 MG tablet, TAKE 1 TABLET (5 MG TOTAL) BY MOUTH AT BEDTIME AS NEEDED., Disp: 30 tablet, Rfl: 4  No Known Allergies  ROS  No other specific complaints in a complete review of systems (except as listed in HPI above).  Objective  Vitals:   09/22/17 1257  BP: (!) 156/82  Pulse: 87  Resp: 18  Temp: 98.4 F (36.9 C)  TempSrc: Oral  SpO2: 95%  Weight: 134 lb 6.4 oz (61 kg)  Height: 5' 2.5" (1.588 m)    Body mass index is 24.19 kg/m.  Nursing Note and Vital Signs reviewed.  Physical Exam  Constitutional: Patient appears well-developed and well-nourished. No distress.  HEENT: head atraumatic, normocephalic,  neck supple without lymphadenopathy, oropharynx pink and moist without exudate Cardiovascular: Normal rate, regular rhythm. No BLE edema. Distal pulses intact Pulmonary/Chest: Effort normal and breath sounds clear. No respiratory distress or retractions. Abdominal: Soft and non-tender, bowel sounds present x4 quadrants.  No masses. Neurological: She is alert and oriented to person, place, and time. Coordination, balance, strength, speech and gait are normal.  Skin: Skin is warm and dry.  Psychiatric: Patient has a normal mood and affect. behavior is normal. Judgment and thought content normal.    Assessment & Plan  1. Diarrhea, unspecified type Suspect this could be antibiotic side effect vs viral , doubt that she could have contracted c-diff this early Due to normal workup on 5/30, will stop antibiotics to see if diarrhea clears up Discussed bland diet, oral  hydration, Red flags and when to present for emergency care or RTC including fever >101.64F, new/worsening/un-resolving symptoms,reviewed with patient at time of visit. Follow up and care instructions discussed and provided in AVS.

## 2017-09-22 NOTE — Patient Instructions (Addendum)
Please stop the antibiotics.  Please stay hydrated, I have attached a bland diet for you to follow for the next several days.  Please follow up if no improvement in your symptoms by Wednesday.   Bland Diet A bland diet consists of foods that do not have a lot of fat or fiber. Foods without fat or fiber are easier for the body to digest. They are also less likely to irritate your mouth, throat, stomach, and other parts of your gastrointestinal tract. A bland diet is sometimes called a BRAT diet. What is my plan? Your health care provider or dietitian may recommend specific changes to your diet to prevent and treat your symptoms, such as:  Eating small meals often.  Cooking food until it is soft enough to chew easily.  Chewing your food well.  Drinking fluids slowly.  Not eating foods that are very spicy, sour, or fatty.  Not eating citrus fruits, such as oranges and grapefruit.  What do I need to know about this diet?  Eat a variety of foods from the bland diet food list.  Do not follow a bland diet longer than you have to.  Ask your health care provider whether you should take vitamins. What foods can I eat? Grains  Hot cereals, such as cream of wheat. Bread, crackers, or tortillas made from refined white flour. Rice. Vegetables Canned or cooked vegetables. Mashed or boiled potatoes. Fruits Bananas. Applesauce. Other types of cooked or canned fruit with the skin and seeds removed, such as canned peaches or pears. Meats and Other Protein Sources Scrambled eggs. Creamy peanut butter or other nut butters. Lean, well-cooked meats, such as chicken or fish. Tofu. Soups or broths. Dairy Low-fat dairy products, such as milk, cottage cheese, or yogurt. Beverages Water. Herbal tea. Apple juice. Sweets and Desserts Pudding. Custard. Fruit gelatin. Ice cream. Fats and Oils Mild salad dressings. Canola or olive oil. The items listed above may not be a complete list of allowed  foods or beverages. Contact your dietitian for more options. What foods are not recommended? Foods and ingredients that are often not recommended include:  Spicy foods, such as hot sauce or salsa.  Fried foods.  Sour foods, such as pickled or fermented foods.  Raw vegetables or fruits, especially citrus or berries.  Caffeinated drinks.  Alcohol.  Strongly flavored seasonings or condiments.  The items listed above may not be a complete list of foods and beverages that are not allowed. Contact your dietitian for more information. This information is not intended to replace advice given to you by your health care provider. Make sure you discuss any questions you have with your health care provider. Document Released: 07/31/2015 Document Revised: 09/14/2015 Document Reviewed: 04/20/2014 Elsevier Interactive Patient Education  2018 [employer].

## 2017-09-25 ENCOUNTER — Telehealth: Payer: Self-pay | Admitting: Emergency Medicine

## 2017-09-25 NOTE — Telephone Encounter (Signed)
White envelope with formulary inside has been placed in RB's cubby for review.   Will route to RB and Ria Comment to make aware.

## 2017-09-29 NOTE — Telephone Encounter (Signed)
This envelope has been placed in Dr. Agustina Caroli look at folder. Will update chart when he takes a look at this information.

## 2017-09-30 NOTE — Telephone Encounter (Signed)
Awaiting RB review.

## 2017-09-30 NOTE — Telephone Encounter (Signed)
Looks like all options are tier 3 except for DuoNebs

## 2017-09-30 NOTE — Telephone Encounter (Signed)
Spoke with pt. She is aware of Dr. Agustina Caroli recommendation. Pt would like to speak to her husband about this and get back with [place]. Will await her call back.

## 2017-10-01 NOTE — Telephone Encounter (Signed)
Patient returned calll.  She said at this time she has decided she is not going with the Duoneb.  CB if any questions, [phone].

## 2017-10-01 NOTE — Telephone Encounter (Signed)
Patient has decided not to do Duonebs. RB is there another option for the patient to do. Thanks.

## 2017-10-07 ENCOUNTER — Other Ambulatory Visit: Payer: Self-pay | Admitting: Neurology

## 2017-10-07 NOTE — Telephone Encounter (Signed)
Dr. Byrum - please advise. Thanks. 

## 2017-10-08 DIAGNOSIS — M328 Other forms of systemic lupus erythematosus: Secondary | ICD-10-CM | POA: Diagnosis not present

## 2017-10-08 DIAGNOSIS — L932 Other local lupus erythematosus: Secondary | ICD-10-CM | POA: Diagnosis not present

## 2017-10-08 DIAGNOSIS — R5383 Other fatigue: Secondary | ICD-10-CM | POA: Diagnosis not present

## 2017-10-08 DIAGNOSIS — Z6823 Body mass index (BMI) 23.0-23.9, adult: Secondary | ICD-10-CM | POA: Diagnosis not present

## 2017-10-08 DIAGNOSIS — M2550 Pain in unspecified joint: Secondary | ICD-10-CM | POA: Diagnosis not present

## 2017-10-08 DIAGNOSIS — Z79899 Other long term (current) drug therapy: Secondary | ICD-10-CM | POA: Diagnosis not present

## 2017-10-08 NOTE — Telephone Encounter (Signed)
Called and spoke with pt letting her know information stated by RB.  Pt expressed understanding. Pt states she will wait and discuss with RB at next OV.  Nothing further needed.

## 2017-10-08 NOTE — Telephone Encounter (Signed)
We looked at her formulary.  All of the different options were tier 3 on her insurance.  I do not think any of the options will be any less expensive.  If she wants to avoid DuoNeb then she can stay on her current medication.

## 2017-10-16 DIAGNOSIS — M329 Systemic lupus erythematosus, unspecified: Secondary | ICD-10-CM | POA: Diagnosis not present

## 2017-10-22 ENCOUNTER — Telehealth: Payer: Self-pay | Admitting: Physician Assistant

## 2017-10-22 ENCOUNTER — Encounter

## 2017-10-22 ENCOUNTER — Encounter: Payer: Self-pay | Admitting: Physician Assistant

## 2017-10-22 ENCOUNTER — Ambulatory Visit (INDEPENDENT_AMBULATORY_CARE_PROVIDER_SITE_OTHER): Payer: Medicare Other | Admitting: Physician Assistant

## 2017-10-22 VITALS — BP 124/66 | HR 70 | Ht 62.5 in | Wt 130.4 lb

## 2017-10-22 DIAGNOSIS — R197 Diarrhea, unspecified: Secondary | ICD-10-CM | POA: Diagnosis not present

## 2017-10-22 DIAGNOSIS — R110 Nausea: Secondary | ICD-10-CM | POA: Diagnosis not present

## 2017-10-22 DIAGNOSIS — R1013 Epigastric pain: Secondary | ICD-10-CM | POA: Diagnosis not present

## 2017-10-22 MED ORDER — PANTOPRAZOLE SODIUM 40 MG PO TBEC: 40.0000 mg | DELAYED_RELEASE_TABLET | Freq: Two times a day (BID) | ORAL | 3 refills | Status: DC

## 2017-10-22 MED ORDER — AMBULATORY NON FORMULARY MEDICATION: 1 refills | Status: DC

## 2017-10-22 NOTE — Progress Notes (Addendum)
Chief Complaint: Abdominal pain and nausea  HPI:    Sheila Morales is a 67 year old female, who follows with Dr. Carlean Purl, and presents to clinic today with a complaint of abdominal pain and nausea.    07/05/2014 colonoscopy and EGD.  Colonoscopy showed sessile polyp in the rectum, moderate diverticulosis in the sigmoid colon and otherwise normal.  Pathology showed hyperplastic polyp.  Repeat in 10 years.  EGD was normal.    01/24/2015 office visit Dr. Carlean Purl following up for diarrhea treated with Alosetron for IBS.  She was constipated on antibiotics, also tried on an narcotic cough syrup.  She stopped alosetron as it was costly.  Was having mostly loose stools but some alternating bowel habits.  Did have improvement with colestipol in the past but could not get the right dose.  At that time given colestipol 5 g / 2.5 g alternating days to see if that gets her to a better regimen.  Also had screening for celiac disease and pancreatic insufficiency as well as thyroid function.    09/19/2017 CT of the abdomen and pelvis for lower abdominal pain with nausea.  This showed no acute finding to explain lower abdominal pain.  She did have diverticulosis but no diverticulitis.  Aortic atherosclerosis and previous cholecystectomy.    Today, explains that she was having a small amount of nausea and some epigastric pain which would sometimes radiate into her lower abdomen and had CT as above.  They started her on Cipro & Flagyl for suspected "intestinal infection", when patient started this medication she developed worsening of her epigastric pain and severe diarrhea at least 8 loose stools per day.  She took these medications for only 3 to 4 days and then stopped them due to the severe reaction.  Since stopping these medications her diarrhea has improved, now having 3 soft solid stools per day and this is "returning to normal" over the past month.  Still has epigastric pain and nausea and tells me that "food just does not  taste the same".  Also has epigastric tenderness which radiates up under her ribs associated with nausea.  Interestingly patient tells me that there were a lot of medication interactions listed on her antibiotics so she stopped all of her other medicines except for Plaquenil while taking these antibiotics including her Omeprazole 40 mg.  She does use Ibuprofen or Aleve on an almost daily basis for various aches and pains.    Patient has chronic diarrhea and was on Colestipol but stopped this about a year ago.  She has been having fairly normal stools until incident above.    Social history is positive for going on a girls trip with her daughters and granddaughters in a couple of weeks.    Denies fever, chills, blood in her stool, melena, weight loss or symptoms that awaken her at night.     Past Medical History:  Diagnosis Date  . Arthritis   . Colitis, ischemic ([place])   . External hemorrhoids   . H/O: rheumatic fever   . IBS (irritable bowel syndrome)   . Personal history colonic adenoma 03/25/2008   06/2007 right sided adenoma and 2 right hyperplastic polyps    . Restless leg syndrome    questionable  . Systemic lupus erythematosus ([place])   . Tubular adenoma of colon   . 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 CHOLECYSTECTOMY    . CHOLECYSTECTOMY, LAPAROSCOPIC    .  COLONOSCOPY    . ESOPHAGOGASTRODUODENOSCOPY    . lesion, vulva excision    . TONSILLECTOMY      Current Outpatient Medications  Medication Sig Dispense Refill  . albuterol (PROVENTIL HFA;VENTOLIN HFA) 108 (90 Base) MCG/ACT inhaler Inhale 2 puffs into the lungs every 6 (six) hours as needed for wheezing or shortness of breath. 1 Inhaler 5  . ANORO ELLIPTA 62.5-25 MCG/INH AEPB TAKE 1 PUFF BY MOUTH EVERY DAY 60 each 5  . aspirin 81 MG tablet Take 81 mg by mouth daily.    . Belimumab (BENLYSTA) 200 MG/ML SOAJ Inject 1 mL into the skin every 14 (fourteen) days.    .  cholecalciferol (VITAMIN D) 1000 UNITS tablet Take 1,000 Units by mouth daily.      . colestipol (COLESTID) 5 G granules 1 scoop alternating with 1/2 scoop 500 g 12  . gabapentin (NEURONTIN) 300 MG capsule Take 1 capsule (300 mg total) by mouth 3 (three) times daily. 2 po qam, 2 po afternoon, 3 po qhs 630 capsule 1  . guaiFENesin-dextromethorphan (ROBITUSSIN DM) 100-10 MG/5ML syrup Take 5 mLs by mouth every 4 (four) hours as needed for cough. 118 mL 0  . hydroxychloroquine (PLAQUENIL) 200 MG tablet Take 200 mg by mouth 2 (two) times daily.    . metroNIDAZOLE (FLAGYL) 500 MG tablet Take 1 tablet (500 mg total) by mouth 3 (three) times daily. 21 tablet 0  . MULTIPLE VITAMIN PO Take 1 tablet by mouth daily.      Marland Kitchen omeprazole (PRILOSEC) 40 MG capsule Take 1 capsule (40 mg total) by mouth daily before breakfast. 30 capsule 11  . RESTASIS 0.05 % ophthalmic emulsion INSTILL 1 DROP INTO BOTH EYES TWICE A DAY  6  . triamcinolone cream (KENALOG) 0.1 % APPLY TO AFFECTED AREA TWICE A DAY FOR 2 WEEK AS DIRECTED  0  . UNABLE TO FIND Med Name: study drug, not known what it is.    . vitamin C (ASCORBIC ACID) 500 MG tablet Take 500 mg by mouth daily.      Marland Kitchen zolpidem (AMBIEN) 5 MG tablet TAKE 1 TABLET (5 MG TOTAL) BY MOUTH AT BEDTIME AS NEEDED. 30 tablet 4   No current facility-administered medications for this visit.     Allergies as of 10/22/2017  . (No Known Allergies)    Family History  Problem Relation Age of Onset  . Stroke Mother        Deceased, 73s  . Atrial fibrillation Father   . Colon cancer Father   . Other Sister        Pick's disease, deceased  . Healthy Daughter     Social History   Socioeconomic History  . Marital status: Married    Spouse name: Not on file  . Number of children: 2  . Years of education: 26  . Highest education level: Not on file  Occupational History  . Occupation: Education administrator business  Social Needs  . Financial resource strain: Not on file  . Food  insecurity:    Worry: Not on file    Inability: Not on file  . Transportation needs:    Medical: Not on file    Non-medical: Not on file  Tobacco Use  . Smoking status: Current Every Day Smoker    Packs/day: 0.50    Years: 30.00    Pack years: 15.00    Types: Cigarettes  . Smokeless tobacco: Never Used  . Tobacco comment: Declined info  Substance and Sexual Activity  .  Alcohol use: Yes    Alcohol/week: 3.0 oz    Types: 5 Standard drinks or equivalent per week    Comment: 2 glasses of wine nightly, 4 beers nightly on weekend  . Drug use: No  . Sexual activity: Yes  Lifestyle  . Physical activity:    Days per week: Not on file    Minutes per session: Not on file  . Stress: Not on file  Relationships  . Social connections:    Talks on phone: Not on file    Gets together: Not on file    Attends religious service: Not on file    Active member of club or organization: Not on file    Attends meetings of clubs or organizations: Not on file    Relationship status: Not on file  . Intimate partner violence:    Fear of current or ex partner: Not on file    Emotionally abused: Not on file    Physically abused: Not on file    Forced sexual activity: Not on file  Other Topics Concern  . Not on file  Social History Narrative   HSG. Married '71. 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        Review of Systems:    Constitutional: No weight loss, fever or chills Skin: No rash  Cardiovascular: No chest pain Respiratory: No SOB  Gastrointestinal: See HPI and otherwise negative Genitourinary: No dysuria  Neurological: No headache Musculoskeletal: No new muscle or joint pain Hematologic: No bleeding  Psychiatric: No history of depression or anxiety   Physical Exam:  Vital signs: BP 124/66   Pulse 70   Ht 5' 2.5" (1.588 m)   Wt 130 lb 6.4 oz (59.1 kg)   BMI 23.47 kg/m    Constitutional:    Pleasant Caucasian female appears to be in NAD, Well developed, Well nourished, alert and cooperative Respiratory: Respirations even and unlabored. Lungs clear to auscultation bilaterally.   No wheezes, crackles, or rhonchi.  Cardiovascular: Normal S1, S2. No MRG. Regular rate and rhythm. No peripheral edema, cyanosis or pallor.  Gastrointestinal:  Soft, nondistended, mild epigastric ttp,  No rebound or guarding. Normal bowel sounds. No appreciable masses or hepatomegaly. Psychiatric: Demonstrates good judgement and reason without abnormal affect or behaviors.  RELEVANT LABS AND IMAGING: CBC    Component Value Date/Time   WBC 4.0 09/18/2017 1517   RBC 4.12 09/18/2017 1517   HGB 12.8 09/18/2017 1517   HGB 13.1 01/13/2009 1108   HCT 36.9 09/18/2017 1517   HCT 37.5 01/13/2009 1108   PLT 197.0 09/18/2017 1517   PLT 181 01/13/2009 1108   MCV 89.7 09/18/2017 1517   MCV 86.2 01/13/2009 1108   MCH 30.1 01/13/2009 1108   MCHC 34.7 09/18/2017 1517   RDW 13.2 09/18/2017 1517   RDW 12.2 01/13/2009 1108   LYMPHSABS 1.4 09/18/2017 1517   LYMPHSABS 2.2 01/13/2009 1108   MONOABS 0.8 09/18/2017 1517   MONOABS 0.7 01/13/2009 1108   EOSABS 0.1 09/18/2017 1517   EOSABS 0.1 01/13/2009 1108   BASOSABS 0.1 09/18/2017 1517   BASOSABS 0.0 01/13/2009 1108    CMP     Component Value Date/Time   NA 132 (L) 09/18/2017 1517   K 3.9 09/18/2017 1517   CL 97 09/18/2017 1517   CO2 30 09/18/2017 1517   GLUCOSE 77 09/18/2017 1517   BUN 12 09/18/2017 1517   CREATININE 0.70 09/18/2017 1517  CALCIUM 9.3 09/18/2017 1517   PROT 7.5 09/18/2017 1517   ALBUMIN 4.1 09/18/2017 1517   AST 29 09/18/2017 1517   ALT 27 09/18/2017 1517   ALKPHOS 78 09/18/2017 1517   BILITOT 0.3 09/18/2017 1517    Assessment: 1.  Epigastric pain: Up under her ribs, associated with nausea, worse with eating, all started after recent antibiotics; consider gastritis versus other 2.  Nausea: With above 3.  Diarrhea: Worse on  antibiotics, better now over the past month but does have chronic diarrhea related to IBS-D/status post cholecystectomy  Plan: 1.  It sounds as though patient had a reaction to the antibiotic she was taking for 3 to 4 days and possibly has some lingering gastritis.  Discussed this with her today. 2.  Stop Omeprazole 40 mg daily and start Pantoprazole 40 mg twice daily, 30-60 minutes before eating breakfast and dinner #60 with 2 refills 3.  Prescribed GI cocktail 5-10 mL's every 4-6 hours as needed 4.  Reviewed antireflux diet and lifestyle modifications. 5.  Offered patient antiemetics and she declined today. 6.  Recommend the patient call our clinic in 2 weeks to let us know how she is doing.  If not improving would recommend adding Ranitidine 150 mg twice daily. 7.  Patient to follow in clinic with me in 3 to 4 weeks.  Sheila Newer, PA-C [facility] 10/22/2017, 9:17 AM  Cc: Hoyt Koch, *   Agree with Ms. Daleen Steinhaus's evaluation and management. Ondansetron may be useful for diarrhea if she has persistent sxs and works like alosetron but should be more affordable Gatha Mayer, MD, [facility]

## 2017-10-22 NOTE — Telephone Encounter (Signed)
Spoke to patient, she was confused as to if she needed to continue Flagyl or stop although they made her stomach hurt. Per today's office note she does not need to continue.

## 2017-10-22 NOTE — Patient Instructions (Addendum)
We have sent the following medications to your pharmacy for you to pick up at your convenience: GI cocktail 5-10 ml as needed for pain  Stop Omeprazole   Start Pantoprazole 40 mg twice a day 30-60 minutes before meals.

## 2017-10-29 ENCOUNTER — Telehealth: Payer: Self-pay | Admitting: Physician Assistant

## 2017-10-29 NOTE — Telephone Encounter (Signed)
The pt states she has been having loose stools 1-2 times daily and is going to be on a boat for about 4-5 hours.  She was advised to take an imodium in the AM and another late afternoon if needed and titrate to avoid constipation.  She will keep appt already scheduled for 7/31 with Ellouise Newer.  She will call with any further complaints or problems

## 2017-10-31 ENCOUNTER — Telehealth: Payer: Self-pay | Admitting: Physician Assistant

## 2017-10-31 NOTE — Telephone Encounter (Signed)
The pt has the same abd pain that she had at her last office visit.  She was advised to keep the appt she has with Anderson Malta on 7/31 to discuss further.

## 2017-11-01 ENCOUNTER — Emergency Department (HOSPITAL_COMMUNITY): Payer: Medicare Other

## 2017-11-01 ENCOUNTER — Encounter (HOSPITAL_COMMUNITY): Payer: Self-pay | Admitting: Emergency Medicine

## 2017-11-01 ENCOUNTER — Inpatient Hospital Stay (HOSPITAL_COMMUNITY)
Admission: EM | Admit: 2017-11-01 | Discharge: 2017-11-08 | DRG: 682 | Disposition: A | Discharge status: Home / Self Care | Payer: Medicare Other | Attending: Family Medicine | Admitting: Family Medicine

## 2017-11-01 DIAGNOSIS — Z7982 Long term (current) use of aspirin: Secondary | ICD-10-CM | POA: Diagnosis not present

## 2017-11-01 DIAGNOSIS — R0602 Shortness of breath: Secondary | ICD-10-CM

## 2017-11-01 DIAGNOSIS — G2581 Restless legs syndrome: Secondary | ICD-10-CM | POA: Diagnosis present

## 2017-11-01 DIAGNOSIS — J189 Pneumonia, unspecified organism: Secondary | ICD-10-CM | POA: Diagnosis not present

## 2017-11-01 DIAGNOSIS — M329 Systemic lupus erythematosus, unspecified: Secondary | ICD-10-CM | POA: Diagnosis present

## 2017-11-01 DIAGNOSIS — R05 Cough: Secondary | ICD-10-CM

## 2017-11-01 DIAGNOSIS — K589 Irritable bowel syndrome without diarrhea: Secondary | ICD-10-CM | POA: Diagnosis present

## 2017-11-01 DIAGNOSIS — R918 Other nonspecific abnormal finding of lung field: Secondary | ICD-10-CM | POA: Diagnosis not present

## 2017-11-01 DIAGNOSIS — E872 Acidosis: Secondary | ICD-10-CM | POA: Diagnosis present

## 2017-11-01 DIAGNOSIS — F17210 Nicotine dependence, cigarettes, uncomplicated: Secondary | ICD-10-CM | POA: Diagnosis present

## 2017-11-01 DIAGNOSIS — Z79899 Other long term (current) drug therapy: Secondary | ICD-10-CM | POA: Diagnosis not present

## 2017-11-01 DIAGNOSIS — E860 Dehydration: Secondary | ICD-10-CM | POA: Diagnosis not present

## 2017-11-01 DIAGNOSIS — R848 Other abnormal findings in specimens from respiratory organs and thorax: Secondary | ICD-10-CM | POA: Diagnosis not present

## 2017-11-01 DIAGNOSIS — K760 Fatty (change of) liver, not elsewhere classified: Secondary | ICD-10-CM | POA: Diagnosis not present

## 2017-11-01 DIAGNOSIS — J9690 Respiratory failure, unspecified, unspecified whether with hypoxia or hypercapnia: Secondary | ICD-10-CM | POA: Diagnosis not present

## 2017-11-01 DIAGNOSIS — K5900 Constipation, unspecified: Secondary | ICD-10-CM | POA: Diagnosis not present

## 2017-11-01 DIAGNOSIS — N12 Tubulo-interstitial nephritis, not specified as acute or chronic: Secondary | ICD-10-CM | POA: Diagnosis present

## 2017-11-01 DIAGNOSIS — K559 Vascular disorder of intestine, unspecified: Secondary | ICD-10-CM | POA: Diagnosis present

## 2017-11-01 DIAGNOSIS — F17200 Nicotine dependence, unspecified, uncomplicated: Secondary | ICD-10-CM | POA: Diagnosis not present

## 2017-11-01 DIAGNOSIS — Z716 Tobacco abuse counseling: Secondary | ICD-10-CM | POA: Diagnosis not present

## 2017-11-01 DIAGNOSIS — J440 Chronic obstructive pulmonary disease with acute lower respiratory infection: Secondary | ICD-10-CM | POA: Diagnosis not present

## 2017-11-01 DIAGNOSIS — F419 Anxiety disorder, unspecified: Secondary | ICD-10-CM | POA: Diagnosis present

## 2017-11-01 DIAGNOSIS — J90 Pleural effusion, not elsewhere classified: Secondary | ICD-10-CM | POA: Diagnosis not present

## 2017-11-01 DIAGNOSIS — Z720 Tobacco use: Secondary | ICD-10-CM | POA: Diagnosis not present

## 2017-11-01 DIAGNOSIS — R059 Cough, unspecified: Secondary | ICD-10-CM

## 2017-11-01 DIAGNOSIS — J441 Chronic obstructive pulmonary disease with (acute) exacerbation: Secondary | ICD-10-CM | POA: Diagnosis present

## 2017-11-01 DIAGNOSIS — E871 Hypo-osmolality and hyponatremia: Secondary | ICD-10-CM | POA: Diagnosis present

## 2017-11-01 DIAGNOSIS — Z9049 Acquired absence of other specified parts of digestive tract: Secondary | ICD-10-CM | POA: Diagnosis not present

## 2017-11-01 DIAGNOSIS — G629 Polyneuropathy, unspecified: Secondary | ICD-10-CM | POA: Diagnosis present

## 2017-11-01 DIAGNOSIS — I340 Nonrheumatic mitral (valve) insufficiency: Secondary | ICD-10-CM | POA: Diagnosis not present

## 2017-11-01 DIAGNOSIS — N179 Acute kidney failure, unspecified: Secondary | ICD-10-CM | POA: Diagnosis not present

## 2017-11-01 DIAGNOSIS — R627 Adult failure to thrive: Secondary | ICD-10-CM | POA: Diagnosis present

## 2017-11-01 DIAGNOSIS — J449 Chronic obstructive pulmonary disease, unspecified: Secondary | ICD-10-CM | POA: Diagnosis not present

## 2017-11-01 DIAGNOSIS — R0603 Acute respiratory distress: Secondary | ICD-10-CM

## 2017-11-01 DIAGNOSIS — J9601 Acute respiratory failure with hypoxia: Secondary | ICD-10-CM | POA: Diagnosis not present

## 2017-11-01 LAB — URINALYSIS, ROUTINE W REFLEX MICROSCOPIC
BILIRUBIN URINE: NEGATIVE
Glucose, UA: NEGATIVE mg/dL
HGB URINE DIPSTICK: NEGATIVE
Ketones, ur: NEGATIVE mg/dL
Nitrite: NEGATIVE
PH: 6 (ref 5.0–8.0)
Protein, ur: 100 mg/dL — AB
SPECIFIC GRAVITY, URINE: 1.011 (ref 1.005–1.030)
WBC, UA: >50 WBC/hpf — ABNORMAL HIGH (ref 0–5)

## 2017-11-01 LAB — COMPREHENSIVE METABOLIC PANEL
ALBUMIN: 3.2 g/dL — AB (ref 3.5–5.0)
ALT: 28 U/L (ref 0–44)
AST: 30 U/L (ref 15–41)
Alkaline Phosphatase: 80 U/L (ref 38–126)
Anion gap: 10 (ref 5–15)
BILIRUBIN TOTAL: 0.9 mg/dL (ref 0.3–1.2)
BUN: 23 mg/dL (ref 8–23)
CHLORIDE: 92 mmol/L — AB (ref 98–111)
CO2: 24 mmol/L (ref 22–32)
CREATININE: 1.84 mg/dL — AB (ref 0.44–1.00)
Calcium: 9.1 mg/dL (ref 8.9–10.3)
GFR calc Af Amer: 31 mL/min — ABNORMAL LOW (ref >60)
GFR calc non Af Amer: 27 mL/min — ABNORMAL LOW (ref >60)
GLUCOSE: 100 mg/dL — AB (ref 70–99)
POTASSIUM: 5 mmol/L (ref 3.5–5.1)
Sodium: 126 mmol/L — ABNORMAL LOW (ref 135–145)
Total Protein: 7.1 g/dL (ref 6.5–8.1)

## 2017-11-01 LAB — I-STAT CHEM 8, ED
BUN: 27 mg/dL — ABNORMAL HIGH (ref 8–23)
Calcium, Ion: 1.05 mmol/L — ABNORMAL LOW (ref 1.15–1.40)
Chloride: 95 mmol/L — ABNORMAL LOW (ref 98–111)
Creatinine, Ser: 2.3 mg/dL — ABNORMAL HIGH (ref 0.44–1.00)
Glucose, Bld: 67 mg/dL — ABNORMAL LOW (ref 70–99)
HCT: 34 % — ABNORMAL LOW (ref 36.0–46.0)
Hemoglobin: 11.6 g/dL — ABNORMAL LOW (ref 12.0–15.0)
Potassium: 4.8 mmol/L (ref 3.5–5.1)
Sodium: 125 mmol/L — ABNORMAL LOW (ref 135–145)
TCO2: 21 mmol/L — ABNORMAL LOW (ref 22–32)

## 2017-11-01 LAB — CBC
HEMATOCRIT: 37.1 % (ref 36.0–46.0)
Hemoglobin: 12.2 g/dL (ref 12.0–15.0)
MCH: 29 pg (ref 26.0–34.0)
MCHC: 32.9 g/dL (ref 30.0–36.0)
MCV: 88.1 fL (ref 78.0–100.0)
PLATELETS: 210 10*3/uL (ref 150–400)
RBC: 4.21 MIL/uL (ref 3.87–5.11)
RDW: 12.4 % (ref 11.5–15.5)
WBC: 18.9 10*3/uL — ABNORMAL HIGH (ref 4.0–10.5)

## 2017-11-01 LAB — I-STAT CG4 LACTIC ACID, ED
Lactic Acid, Venous: 1.1 mmol/L (ref 0.5–1.9)
Lactic Acid, Venous: 0.97 mmol/L (ref 0.5–1.9)

## 2017-11-01 LAB — OSMOLALITY, URINE: Osmolality, Ur: 230 mOsm/kg — ABNORMAL LOW (ref 300–900)

## 2017-11-01 LAB — LIPASE, BLOOD: Lipase: 27 U/L (ref 11–51)

## 2017-11-01 MED ORDER — PREDNISONE 5 MG (48) PO TBPK: 40.0000 mg | ORAL_TABLET | Freq: Every day | ORAL | Status: DC

## 2017-11-01 MED ORDER — ZOLPIDEM TARTRATE 5 MG PO TABS
5.0000 mg | ORAL_TABLET | Freq: Every evening | ORAL | Status: DC | PRN
  Administered 2017-11-02 – 2017-11-07 (×4): 5 mg via ORAL
  Filled 2017-11-01 (×6): qty 1

## 2017-11-01 MED ORDER — OXYCODONE-ACETAMINOPHEN 5-325 MG PO TABS
1.0000 | ORAL_TABLET | Freq: Four times a day (QID) | ORAL | Status: AC | PRN
  Administered 2017-11-01 – 2017-11-04 (×2): 1 via ORAL
  Filled 2017-11-01 (×2): qty 1

## 2017-11-01 MED ORDER — ONDANSETRON HCL 4 MG/2ML IJ SOLN
4.0000 mg | Freq: Three times a day (TID) | INTRAMUSCULAR | Status: AC
  Administered 2017-11-01 – 2017-11-02 (×3): 4 mg via INTRAVENOUS
  Filled 2017-11-01 (×3): qty 2

## 2017-11-01 MED ORDER — SODIUM CHLORIDE 0.9 % IV BOLUS
1000.0000 mL | Freq: Once | INTRAVENOUS | Status: AC
  Administered 2017-11-01: 1000 mL via INTRAVENOUS

## 2017-11-01 MED ORDER — PANTOPRAZOLE SODIUM 40 MG PO TBEC: 40.0000 mg | DELAYED_RELEASE_TABLET | Freq: Two times a day (BID) | ORAL | Status: DC

## 2017-11-01 MED ORDER — VITAMIN D 1000 UNITS PO TABS
1000.0000 [IU] | ORAL_TABLET | Freq: Every day | ORAL | Status: DC
  Administered 2017-11-01 – 2017-11-08 (×8): 1000 [IU] via ORAL
  Filled 2017-11-01 (×8): qty 1

## 2017-11-01 MED ORDER — SODIUM CHLORIDE 0.9 % IV SOLN: 1.0000 g | Freq: Once | INTRAVENOUS | Status: DC

## 2017-11-01 MED ORDER — ALBUTEROL SULFATE (2.5 MG/3ML) 0.083% IN NEBU
3.0000 mL | INHALATION_SOLUTION | Freq: Four times a day (QID) | RESPIRATORY_TRACT | Status: DC | PRN
  Administered 2017-11-03 – 2017-11-04 (×2): 3 mL via RESPIRATORY_TRACT
  Filled 2017-11-01 (×3): qty 3

## 2017-11-01 MED ORDER — GABAPENTIN 300 MG PO CAPS
300.0000 mg | ORAL_CAPSULE | Freq: Three times a day (TID) | ORAL | Status: DC
  Administered 2017-11-01 – 2017-11-08 (×20): 300 mg via ORAL
  Filled 2017-11-01 (×20): qty 1

## 2017-11-01 MED ORDER — SODIUM CHLORIDE 0.9 % IV SOLN
1.0000 g | INTRAVENOUS | Status: DC
  Administered 2017-11-01 – 2017-11-02 (×2): 1 g via INTRAVENOUS
  Filled 2017-11-01 (×2): qty 10
  Filled 2017-11-01: qty 1

## 2017-11-01 MED ORDER — CYCLOSPORINE 0.05 % OP EMUL
1.0000 [drp] | Freq: Two times a day (BID) | OPHTHALMIC | Status: DC
  Administered 2017-11-01 – 2017-11-08 (×14): 1 [drp] via OPHTHALMIC
  Filled 2017-11-01 (×14): qty 1

## 2017-11-01 MED ORDER — HYDROXYCHLOROQUINE SULFATE 200 MG PO TABS
200.0000 mg | ORAL_TABLET | Freq: Two times a day (BID) | ORAL | Status: DC
  Administered 2017-11-01 – 2017-11-08 (×14): 200 mg via ORAL
  Filled 2017-11-01 (×14): qty 1

## 2017-11-01 MED ORDER — IOPAMIDOL (ISOVUE-300) INJECTION 61%
INTRAVENOUS | Status: AC
  Filled 2017-11-01: qty 30

## 2017-11-01 MED ORDER — ENOXAPARIN SODIUM 30 MG/0.3ML ~~LOC~~ SOLN
30.0000 mg | SUBCUTANEOUS | Status: DC
  Administered 2017-11-01 – 2017-11-05 (×5): 30 mg via SUBCUTANEOUS
  Filled 2017-11-01 (×6): qty 0.3

## 2017-11-01 MED ORDER — ONDANSETRON HCL 4 MG/2ML IJ SOLN
4.0000 mg | Freq: Four times a day (QID) | INTRAMUSCULAR | Status: DC | PRN
  Administered 2017-11-01 (×2): 4 mg via INTRAVENOUS
  Filled 2017-11-01: qty 2

## 2017-11-01 MED ORDER — PREDNISONE 20 MG PO TABS
30.0000 mg | ORAL_TABLET | Freq: Every day | ORAL | Status: DC
  Administered 2017-11-02 – 2017-11-04 (×3): 30 mg via ORAL
  Filled 2017-11-01 (×3): qty 2

## 2017-11-01 MED ORDER — SODIUM CHLORIDE 0.9 % IV SOLN: 1.0000 g | INTRAVENOUS | Status: DC

## 2017-11-01 MED ORDER — SODIUM CHLORIDE 0.9 % IV SOLN
INTRAVENOUS | Status: DC
  Administered 2017-11-01 – 2017-11-02 (×3): via INTRAVENOUS

## 2017-11-01 MED ORDER — ASPIRIN EC 81 MG PO TBEC
81.0000 mg | DELAYED_RELEASE_TABLET | Freq: Every day | ORAL | Status: DC
  Administered 2017-11-01 – 2017-11-08 (×9): 81 mg via ORAL
  Filled 2017-11-01 (×8): qty 1

## 2017-11-01 NOTE — Progress Notes (Signed)
Sheila Morales is a 67 y.o. female patient admitted from ED awake, alert - oriented  X 4 - no acute distress noted.  VSS - Blood pressure 113/65, pulse 89, temperature 98.4 F (36.9 C), temperature source Oral, resp. rate 18, height 5\' 2"  (1.575 m), weight 60.5 kg (133 lb 6.1 oz), SpO2 96 %.    IV in place, occlusive dsg intact without redness.  Orientation to room, and floor completed with information packet given to patient/family.  Patient declined safety video at this time.  Admission INP armband ID verified with patient/family, and in place.   SR up x 2, fall assessment complete, with patient and family able to verbalize understanding of risk associated with falls, and verbalized understanding to call nsg before up out of bed.  Call light within reach, patient able to voice, and demonstrate understanding.  Skin assessment completed and verified by 2nd RN.    Will cont to eval and treat per MD orders.  Richardean Chimera, RN 11/01/2017 5:58 PM

## 2017-11-01 NOTE — Progress Notes (Signed)
Patient complaining of headache of 10/10 on a pain scale and there's no order for pain medication. Paged MD and received order from [facility] NP for Percocet 5-325 mg 1-2 tabs every 6 hours for severe pain . Will administer and continue to monitor.

## 2017-11-01 NOTE — ED Notes (Signed)
Patient transported to CT 

## 2017-11-01 NOTE — ED Triage Notes (Signed)
Pt reports abd pain x1 week that she saw her pcp for, was put on abx that upset her stomach, pt then placed on protonix, not getting any relief, states last bm was 3 days ago, no blood in stools, no urinary complaints. Currently on day 6/12 of steroid for lupus flare.

## 2017-11-01 NOTE — ED Provider Notes (Signed)
[facility] EMERGENCY DEPARTMENT Provider Note   CSN: [id number] Arrival date & time: 11/01/17  0809     History   Chief Complaint Chief Complaint  Patient presents with  . Abdominal Pain    HPI Sheila Morales is a 67 y.o. female with history of IBS, ischemic colitis, SLE, and COPD presents for evaluation of acute onset, progressively worsening abdominal pain for 2 weeks.  She states that several weeks ago she was put on Cipro and Flagyl for an abdominal infection which caused worsening epigastric pain and severe diarrhea.  She was seen and evaluated by her gastroenterologist on 10/22/2017 at which point she was stopped on her omeprazole and started on pantoprazole 40 mg twice daily.  She states this medicine has not been particularly helpful.  She endorses ongoing abdominal pain which "when it intensifies "starts in the epigastric  region and radiates all over her abdomen.  She currently complains of dull aching lower abdominal pain.  She endorses nausea but no vomiting.  Denies diarrhea lately and states she has not had a bowel movement in 3 days.  States that she has had very decreased oral intake over the past few days.  Pain sometimes worsens after meals, improves somewhat laying prone in her bed.  She states that over the past 2 days she has felt subjective fevers and chills.  Her husband took her temperature this morning while she was in bed wrapped up in blankets and states that her temperature was 101 F.  She was given 2 tablets of acetaminophen with improvement.  Of note, patient is currently on day 6 out of 12-day steroid taper for a lupus flare.  The history is provided by the patient and the spouse.    Past Medical History:  Diagnosis Date  . Arthritis   . Colitis, ischemic ([place])   . External hemorrhoids   . H/O: rheumatic fever   . IBS (irritable bowel syndrome)   . Personal history colonic adenoma 03/25/2008   06/2007 right sided adenoma and 2 right  hyperplastic polyps    . Restless leg syndrome    questionable  . Systemic lupus erythematosus ([place])   . Tubular adenoma of colon   . 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 Hyponatremia 11/01/2017  . Insomnia 02/24/2017  . COPD (chronic obstructive pulmonary disease) ([place]) 01/22/2017  . Routine general medical examination at a health care facility 02/19/2016  . Hereditary and idiopathic peripheral neuropathy 02/17/2015  . Smokers' cough ([place]) 12/07/2014  . Systemic lupus erythematosus ([place]) 10/13/2013  . TOBACCO USE DISORDER/SMOKER-SMOKING CESSATION DISCUSSED 01/02/2009  . Irritable bowel syndrome 03/25/2008  . RHEUMATIC FEVER, HX OF 01/17/2007    Past Surgical History:  Procedure Laterality Date  . CHOLECYSTECTOMY    . CHOLECYSTECTOMY, LAPAROSCOPIC    . COLONOSCOPY    . ESOPHAGOGASTRODUODENOSCOPY    . lesion, vulva excision    . TONSILLECTOMY       OB History   None      Home Medications    Prior to Admission medications   Medication Sig Start Date End Date Taking? Authorizing Provider  albuterol (PROVENTIL HFA;VENTOLIN HFA) 108 (90 Base) MCG/ACT inhaler Inhale 2 puffs into the lungs every 6 (six) hours as needed for wheezing or shortness of breath. 01/23/17  Yes Byrum, Rose Fillers, MD  AMBULATORY NON FORMULARY MEDICATION Medication Name: GI cocktail 5-10 mL every 4-6 hours as  needed for pain 10/22/17  Yes Levin Erp, [place]  aspirin 81 MG tablet Take 81 mg by mouth daily.   Yes [provider]  Belimumab (BENLYSTA) 200 MG/ML SOAJ Inject 1 mL into the skin every 30 (thirty) days. Pt goes to Dr Lenna Gilford to get infusion. 10-11-17   Yes [provider]  cholecalciferol (VITAMIN D) 1000 UNITS tablet Take 1,000 Units by mouth daily.     Yes [provider]  gabapentin (NEURONTIN) 300 MG capsule Take 1 capsule (300 mg total) by mouth 3 (three) times daily. 2 po qam, 2 po afternoon, 3 po  qhs Patient taking differently: Take 600-900 mg by mouth See admin instructions. 600 mg every morning & afternoon, 900 mg at bedtime 10/07/17  Yes Patel, Donika K, DO  glucosamine-chondroitin 500-400 MG tablet Take 2 tablets by mouth 2 (two) times daily.   Yes [provider]  guaiFENesin-dextromethorphan (ROBITUSSIN DM) 100-10 MG/5ML syrup Take 5 mLs by mouth every 4 (four) hours as needed for cough. 09/27/16  Yes Nche, Charlene Brooke, NP  hydroxychloroquine (PLAQUENIL) 200 MG tablet Take 200 mg by mouth 2 (two) times daily.   Yes [provider]  MULTIPLE VITAMIN PO Take 1 tablet by mouth daily.     Yes [provider]  Multiple Vitamins-Minerals (MULTIVITAMIN WITH MINERALS) tablet Take 1 tablet by mouth daily.   Yes [provider]  Omega-3 1000 MG CAPS Take 1,000 mg by mouth daily.   Yes [provider]  pantoprazole (PROTONIX) 40 MG tablet Take 1 tablet (40 mg total) by mouth 2 (two) times daily before a meal. 10/22/17  Yes Lemmon, Lavone Nian, PA  pantoprazole (PROTONIX) 40 MG tablet Take 40 mg by mouth daily.   Yes [provider]  predniSONE (STERAPRED UNI-PAK 48 TAB) 5 MG (48) TBPK tablet Take 40 mg by mouth daily. Pt on dose pack. Started on 10-28-17. On day 3 of therapy 10/28/17  Yes [provider]  RESTASIS 0.05 % ophthalmic emulsion INSTILL 1 DROP INTO BOTH EYES TWICE A DAY 07/30/17  Yes [provider]  triamcinolone cream (KENALOG) 0.1 % APPLY TO AFFECTED AREA TWICE A DAY FOR 2 WEEK AS DIRECTED 09/08/17  Yes [provider]  vitamin C (ASCORBIC ACID) 500 MG tablet Take 500 mg by mouth daily.     Yes [provider]  vitamin E 1000 UNIT capsule Take 1,000 Units by mouth daily.   Yes [provider]  zolpidem (AMBIEN) 5 MG tablet TAKE 1 TABLET (5 MG TOTAL) BY MOUTH AT BEDTIME AS NEEDED. 07/15/17  Yes Hoyt Koch, MD  ANORO ELLIPTA 62.5-25 MCG/INH AEPB TAKE 1 PUFF BY MOUTH EVERY  DAY Patient not taking: Reported on 11/01/2017 08/04/17   Collene Gobble, MD  colestipol (COLESTID) 5 G granules 1 scoop alternating with 1/2 scoop Patient not taking: Reported on 10/22/2017 01/24/15   Gatha Mayer, MD  metroNIDAZOLE (FLAGYL) 500 MG tablet Take 1 tablet (500 mg total) by mouth 3 (three) times daily. Patient not taking: Reported on 11/01/2017 09/18/17   Marrian Salvage, FNP  omeprazole (PRILOSEC) 40 MG capsule Take 1 capsule (40 mg total) by mouth daily before breakfast. Patient not taking: Reported on 11/01/2017 07/05/14   Gatha Mayer, MD    Family History Family History  Problem Relation Age of Onset  . Stroke Mother        Deceased, 90s  . Atrial fibrillation Father   . Colon cancer Father   .  Other Sister        Pick's disease, deceased  . Healthy Daughter     Social History Social History   Tobacco Use  . Smoking status: Current Every Day Smoker    Packs/day: 0.50    Years: 30.00    Pack years: 15.00    Types: Cigarettes  . Smokeless tobacco: Never Used  . Tobacco comment: Declined info  Substance Use Topics  . Alcohol use: Yes    Alcohol/week: 3.0 oz    Types: 5 Standard drinks or equivalent per week    Comment: 2 glasses of wine nightly, 4 beers nightly on weekend  . Drug use: No     Allergies   Patient has no known allergies.   Review of Systems Review of Systems  Constitutional: Positive for chills and fever.  Gastrointestinal: Positive for abdominal pain, constipation and nausea. Negative for blood in stool.  Genitourinary: Positive for decreased urine volume. Negative for dysuria and hematuria.  All other systems reviewed and are negative.    Physical Exam Updated Vital Signs BP (!) 118/56   Pulse 92   Temp (!) 97.1 F (36.2 C) (Rectal)   Resp (!) 22   SpO2 95%   Physical Exam  Constitutional: She appears well-developed and well-nourished. No distress.  HENT:  Head: Normocephalic and atraumatic.  Eyes: Conjunctivae  are normal. Right eye exhibits no discharge. Left eye exhibits no discharge.  Neck: No JVD present. No tracheal deviation present.  Cardiovascular: Normal rate, regular rhythm and normal heart sounds.  Pulmonary/Chest: Effort normal and breath sounds normal.  Abdominal: Soft. She exhibits no distension. Bowel sounds are increased. There is no tenderness. There is no rigidity, no rebound, no guarding, no CVA tenderness and negative Murphy's sign.  Well-healed surgical incisions from laparoscopic cholecystectomy.  Musculoskeletal: She exhibits no edema.  Neurological: She is alert.  Skin: Skin is warm and dry. Rash noted. No erythema.  Generalized scaly rash to the chest and extremities.  Patient states this is related to her lupus and she is currently being treated for it with steroids  Psychiatric: She has a normal mood and affect. Her behavior is normal.  Nursing note and vitals reviewed.    ED Treatments / Results  Labs (all labs ordered are listed, but only abnormal results are displayed) Labs Reviewed  COMPREHENSIVE METABOLIC PANEL - Abnormal; Notable for the following components:      Result Value   Sodium 126 (*)    Chloride 92 (*)    Glucose, Bld 100 (*)    Creatinine, Ser 1.84 (*)    Albumin 3.2 (*)    GFR calc non Af Amer 27 (*)    GFR calc Af Amer 31 (*)    All other components within normal limits  CBC - Abnormal; Notable for the following components:   WBC 18.9 (*)    All other components within normal limits  URINALYSIS, ROUTINE W REFLEX MICROSCOPIC - Abnormal; Notable for the following components:   Color, Urine AMBER (*)    APPearance CLOUDY (*)    Protein, ur 100 (*)    Leukocytes, UA LARGE (*)    WBC, UA >50 (*)    Bacteria, UA MANY (*)    All other components within normal limits  I-STAT CHEM 8, ED - Abnormal; Notable for the following components:   Sodium 125 (*)    Chloride 95 (*)    BUN 27 (*)    Creatinine, Ser 2.30 (*)  Glucose, Bld 67 (*)     Calcium, Ion 1.05 (*)    TCO2 21 (*)    Hemoglobin 11.6 (*)    HCT 34.0 (*)    All other components within normal limits  URINE CULTURE  LIPASE, BLOOD  HIV ANTIBODY (ROUTINE TESTING)  OSMOLALITY, URINE  OSMOLALITY  SODIUM, URINE, RANDOM  CBC  BASIC METABOLIC PANEL  I-STAT CG4 LACTIC ACID, ED  I-STAT CG4 LACTIC ACID, ED    EKG None  Radiology Ct Abdomen Pelvis Wo Contrast  Result Date: 11/01/2017 CLINICAL DATA:  Diffuse abdominal pain for the last week. Also having constipation. Hx: cholecystectomy, colitis EXAM: CT ABDOMEN AND PELVIS WITHOUT CONTRAST TECHNIQUE: Multidetector CT imaging of the abdomen and pelvis was performed following the standard protocol without IV contrast. COMPARISON:  09/19/2017 FINDINGS: Lower chest: Linear and reticular type opacities are noted at the lung bases, increased from the prior study, consistent with subsegmental atelectasis. No convincing pneumonia. No pulmonary edema. Heart is normal in size. Hepatobiliary: Decreased attenuation of the liver consistent with fatty infiltration. No liver mass or focal lesion. Gallbladder surgically absent. No bile duct dilation. Pancreas: Unremarkable. No pancreatic ductal dilatation or surrounding inflammatory changes. Spleen: Normal in size without focal abnormality. Adrenals/Urinary Tract: No adrenal masses. Right kidney mildly displaced inferiorly. There is bilateral perinephric stranding that is greater on the right, increased when compared the prior CT. No renal masses. No stones. No hydronephrosis. Ureters are normal in course and caliber. No ureteral stones. Bladder is decompressed but otherwise unremarkable. Stomach/Bowel: Stomach is within normal limits. Appendix appears normal. No evidence of bowel wall thickening, distention, or inflammatory changes. There are colonic diverticula without diverticulitis. No increase in colonic stool. Vascular/Lymphatic: Aortic atherosclerosis. No enlarged abdominal or pelvic lymph  nodes. Reproductive: Uterus and bilateral adnexa are unremarkable. Other: No abdominal wall hernia or abnormality. No abdominopelvic ascites. Musculoskeletal: No fracture or acute finding. No osteoblastic or osteolytic lesions. IMPRESSION: 1. Right greater than left perinephric stranding increased when compared to the prior CT. No evidence of obstructive uropathy. Consider pyelonephritis if there are consistent clinical findings. 2. No evidence of bowel obstruction or inflammation. No increase in stool. 3. Hepatic steatosis. 4. Aortic atherosclerosis. Electronically Signed   By: Lajean Manes M.D.   On: 11/01/2017 17:03    Procedures Procedures (including critical care time)  Medications Ordered in ED Medications  iopamidol (ISOVUE-300) 61 % injection (has no administration in time range)  enoxaparin (LOVENOX) injection 30 mg (has no administration in time range)  albuterol (PROVENTIL) (2.5 MG/3ML) 0.083% nebulizer solution 3 mL (has no administration in time range)  aspirin EC tablet 81 mg (has no administration in time range)  cholecalciferol (VITAMIN D) tablet 1,000 Units (has no administration in time range)  gabapentin (NEURONTIN) capsule 300 mg (has no administration in time range)  hydroxychloroquine (PLAQUENIL) tablet 200 mg (has no administration in time range)  pantoprazole (PROTONIX) EC tablet 40 mg (has no administration in time range)  predniSONE (STERAPRED UNI-PAK 48 TAB) tablet 40 mg (has no administration in time range)  cycloSPORINE (RESTASIS) 0.05 % ophthalmic emulsion 1 drop (has no administration in time range)  zolpidem (AMBIEN) tablet 5 mg (has no administration in time range)  0.9 %  sodium chloride infusion (has no administration in time range)  cefTRIAXone (ROCEPHIN) 1 g in sodium chloride 0.9 % 100 mL IVPB (has no administration in time range)  sodium chloride 0.9 % bolus 1,000 mL (0 mLs Intravenous Stopped 11/01/17 1126)  sodium chloride 0.9 %  bolus 1,000 mL (0 mLs  Intravenous Stopped 11/01/17 1519)     Initial Impression / Assessment and Plan / ED Course  I have reviewed the triage vital signs and the nursing notes.  Pertinent labs & imaging results that were available during my care of the patient were reviewed by me and considered in my medical decision making (see chart for details).     Patient presents with progressively worsening abdominal pain, decreased appetite, and decreased urine output.  She was apparently febrile at home to 101 F.  She is presently afebrile in the ED, initially somewhat tachycardic and intermittently tachypneic.  She is uncomfortable but nontoxic in appearance.  Lab work reviewed by me significant for leukocytosis which could be explained by the steroids she is currently on for her lupus flare.  Also significant for hyponatremia with sodium 126, elevated creatinine of 1.84 which is more than double the patient's baseline of around 0.7.  UA consistent with UTI, will culture.  Started the patient on Rocephin.  Patient also appears dehydrated, given 2 L of normal saline in the ED.  Noncontrast CT of the abdomen and pelvis consistent with pyelonephritis.  No evidence of obstruction, perforation, appendicitis, colitis, or other acute surgical abdominal pathology.  She does not appear to be septic at this time.  Spoke with Dr. Rodena Piety with [facility] hospitalist service who agrees to assume care of patient and bring her into the hospital for further evaluation and management.  Patient seen and evaluated by Dr. Jeanell Sparrow who agrees with assessment and plan at this time.  Final Clinical Impressions(s) / ED Diagnoses   Final diagnoses:  AKI (acute kidney injury) [facility])  Pyelonephritis  Dehydration    ED Discharge Orders    None       Renita Papa, PA-C 11/01/17 1711    Pattricia Boss, MD 11/02/17 [phone]

## 2017-11-01 NOTE — H&P (Addendum)
History and Physical    Sheila Morales WUJ:[id number] DOB: 12/26/1950 DOA: 11/01/2017  PCP: Hoyt Koch, MD Patient coming from: home  Chief Complaint: ABDOMINAL PAIN   HPI: Sheila Morales is a 67 y.o. female with medical history significant of lupus on plaquenil rheumatic fever,ibs GERD admitted with abdominal pain for one week.  Patient does have a history of chronic abdominal pain.  She feels this pain is somewhat chronic but has gotten worse in the last few days since she started taking Cipro for a UTI.  She denies any nausea vomiting but has decreased appetite and decreased p.o. intake.  She did not have a bowel movement for 3 days prior to coming in to the hospital.  When she got the contrast with a CT she had multiple bowel movements.  No hematuria hematochezia or hematemesis.  Family reported that she had a fever of 101.0 at home.  She is followed by [facility] and had an appointment in the next 1 week.  Patient is also taking prednisone 40 mg for a lupus flare along with Protonix 40 mg twice a day.  She does have a history of COPD does not have oxygen at home.    ED Course: received 2 lit NS.  Labs were significant for sodium of 120 6 repeat was 125 BUN 27 first creatinine was 1.8 4 repeat was 2.30.  Lactic acid level was 1.1 0 repeat was 0.97 hemoglobin 12.2 white count 18.9 platelet count 210.  UA was positive for large amount of leukocytes.  Signs 05/06/1965 temperature 98 pulse was 92 respiration 18.  Review of Systems: Significant for decreased appetite, abdominal pain. Ambulatory Status:she is ambulatory at baseline  Past Medical History:  Diagnosis Date  . Arthritis   . Colitis, ischemic ([place])   . External hemorrhoids   . H/O: rheumatic fever   . IBS (irritable bowel syndrome)   . Personal history colonic adenoma 03/25/2008   06/2007 right sided adenoma and 2 right hyperplastic polyps    . Restless leg syndrome    questionable  . Systemic lupus erythematosus ([place])     . Tubular adenoma of colon   . 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 CHOLECYSTECTOMY    . CHOLECYSTECTOMY, LAPAROSCOPIC    . COLONOSCOPY    . ESOPHAGOGASTRODUODENOSCOPY    . lesion, vulva excision    . TONSILLECTOMY      Social History   Socioeconomic History  . Marital status: Married    Spouse name: Not on file  . Number of children: 2  . Years of education: 62  . Highest education level: Not on file  Occupational History  . Occupation: Education administrator business  Social Needs  . Financial resource strain: Not on file  . Food insecurity:    Worry: Not on file    Inability: Not on file  . Transportation needs:    Medical: Not on file    Non-medical: Not on file  Tobacco Use  . Smoking status: Current Every Day Smoker    Packs/day: 0.50    Years: 30.00    Pack years: 15.00    Types: Cigarettes  . Smokeless tobacco: Never Used  . Tobacco comment: Declined info  Substance and Sexual Activity  . Alcohol use: Yes    Alcohol/week: 3.0 oz    Types: 5 Standard drinks or equivalent per week    Comment: 2 glasses of  wine nightly, 4 beers nightly on weekend  . Drug use: No  . Sexual activity: Yes  Lifestyle  . Physical activity:    Days per week: Not on file    Minutes per session: Not on file  . Stress: Not on file  Relationships  . Social connections:    Talks on phone: Not on file    Gets together: Not on file    Attends religious service: Not on file    Active member of club or organization: Not on file    Attends meetings of clubs or organizations: Not on file    Relationship status: Not on file  . Intimate partner violence:    Fear of current or ex partner: Not on file    Emotionally abused: Not on file    Physically abused: Not on file    Forced sexual activity: Not on file  Other Topics Concern  . Not on file  Social History Narrative   HSG. Married '71. 1 daughter- '74, '78; 2 grand daughters.Work:  Armed forces operational officer. Lives with husband and mother-in-law is moving in after rehab.        No Known Allergies  Family History  Problem Relation Age of Onset  . Stroke Mother        Deceased, 22s  . Atrial fibrillation Father   . Colon cancer Father   . Other Sister        Pick's disease, deceased  . Healthy Daughter       Prior to Admission medications   Medication Sig Start Date End Date Taking? Authorizing Provider  albuterol (PROVENTIL HFA;VENTOLIN HFA) 108 (90 Base) MCG/ACT inhaler Inhale 2 puffs into the lungs every 6 (six) hours as needed for wheezing or shortness of breath. 01/23/17  Yes Collene Gobble, MD  AMBULATORY NON FORMULARY MEDICATION Medication Name: GI cocktail 5-10 mL every 4-6 hours as needed for pain 10/22/17  Yes Levin Erp, [place]  aspirin 81 MG tablet Take 81 mg by mouth daily.   Yes [provider]  Belimumab (BENLYSTA) 200 MG/ML SOAJ Inject 1 mL into the skin every 30 (thirty) days. Pt goes to Dr Lenna Gilford to get infusion. 10-11-17   Yes [provider]  cholecalciferol (VITAMIN D) 1000 UNITS tablet Take 1,000 Units by mouth daily.     Yes [provider]  gabapentin (NEURONTIN) 300 MG capsule Take 1 capsule (300 mg total) by mouth 3 (three) times daily. 2 po qam, 2 po afternoon, 3 po qhs Patient taking differently: Take 600-900 mg by mouth See admin instructions. 600 mg every morning & afternoon, 900 mg at bedtime 10/07/17  Yes Patel, Donika K, DO  glucosamine-chondroitin 500-400 MG tablet Take 2 tablets by mouth 2 (two) times daily.   Yes [provider]  guaiFENesin-dextromethorphan (ROBITUSSIN DM) 100-10 MG/5ML syrup Take 5 mLs by mouth every 4 (four) hours as needed for cough. 09/27/16  Yes Nche, Charlene Brooke, NP  hydroxychloroquine (PLAQUENIL) 200 MG tablet Take 200 mg by mouth 2 (two) times daily.   Yes [provider]  MULTIPLE VITAMIN PO Take 1 tablet by mouth daily.     Yes [provider]  Multiple  Vitamins-Minerals (MULTIVITAMIN WITH MINERALS) tablet Take 1 tablet by mouth daily.   Yes [provider]  Omega-3 1000 MG CAPS Take 1,000 mg by mouth daily.   Yes [provider]  pantoprazole (PROTONIX) 40 MG tablet Take 1 tablet (40 mg total) by mouth 2 (two) times daily before  a meal. 10/22/17  Yes Lemmon, Lavone Nian, PA  pantoprazole (PROTONIX) 40 MG tablet Take 40 mg by mouth daily.   Yes [provider]  predniSONE (STERAPRED UNI-PAK 48 TAB) 5 MG (48) TBPK tablet Take 40 mg by mouth daily. Pt on dose pack. Started on 10-28-17. On day 3 of therapy 10/28/17  Yes [provider]  RESTASIS 0.05 % ophthalmic emulsion INSTILL 1 DROP INTO BOTH EYES TWICE A DAY 07/30/17  Yes [provider]  triamcinolone cream (KENALOG) 0.1 % APPLY TO AFFECTED AREA TWICE A DAY FOR 2 WEEK AS DIRECTED 09/08/17  Yes [provider]  vitamin C (ASCORBIC ACID) 500 MG tablet Take 500 mg by mouth daily.     Yes [provider]  vitamin E 1000 UNIT capsule Take 1,000 Units by mouth daily.   Yes [provider]  zolpidem (AMBIEN) 5 MG tablet TAKE 1 TABLET (5 MG TOTAL) BY MOUTH AT BEDTIME AS NEEDED. 07/15/17  Yes Hoyt Koch, MD  ANORO ELLIPTA 62.5-25 MCG/INH AEPB TAKE 1 PUFF BY MOUTH EVERY DAY Patient not taking: Reported on 11/01/2017 08/04/17   Collene Gobble, MD  colestipol (COLESTID) 5 G granules 1 scoop alternating with 1/2 scoop Patient not taking: Reported on 10/22/2017 01/24/15   Gatha Mayer, MD  metroNIDAZOLE (FLAGYL) 500 MG tablet Take 1 tablet (500 mg total) by mouth 3 (three) times daily. Patient not taking: Reported on 11/01/2017 09/18/17   Marrian Salvage, FNP  omeprazole (PRILOSEC) 40 MG capsule Take 1 capsule (40 mg total) by mouth daily before breakfast. Patient not taking: Reported on 11/01/2017 07/05/14   Gatha Mayer, MD    Physical Exam: Vitals:   11/01/17 1300 11/01/17 1336 11/01/17 1500 11/01/17 1615  BP: 108/70   111/61 (!) 118/56  Pulse: 87  88 92  Resp: (!) 21  (!) 22   Temp:  (!) 97.1 F (36.2 C)    TempSrc:  Rectal    SpO2: 98%  97% 95%     General:  Appears calm and comfortable Eyes: PERRL, EOMI, normal lids, iris ENT: grossly normal hearing, lips & tongue, mmm Neck:  no LAD, masses or thyromegaly Cardiovascular: RRR, no m/r/g. No LE edema.  Respiratory: CTA bilaterally, no w/r/r. Normal respiratory effort. Abdomen: soft,nd, NABS.EPIGASTRIC TENDERNESS.  No rebound or guarding. Skin: Extensive chronic rash throughout the skin upper extremities and upper body. Musculoskeletal:  grossly normal tone BUE/BLE, good ROM, no bony abnormality Psychiatric: grossly normal mood and affect, speech fluent and appropriate, AOx3 Neurologic:  CN 2-12 grossly intact, moves all extremities in coordinated fashion, sensation intact  Labs on Admission: I have personally reviewed following labs and imaging studies  CBC: Recent Labs  Lab 11/01/17 0835 11/01/17 1514  WBC 18.9*  --   HGB 12.2 11.6*  HCT 37.1 34.0*  MCV 88.1  --   PLT 210  --    Basic Metabolic Panel: Recent Labs  Lab 11/01/17 0835 11/01/17 1514  NA 126* 125*  K 5.0 4.8  CL 92* 95*  CO2 24  --   GLUCOSE 100* 67*  BUN 23 27*  CREATININE 1.84* 2.30*  CALCIUM 9.1  --    GFR: Estimated Creatinine Clearance: 19 mL/min (A) (by C-G formula based on SCr of 2.3 mg/dL (H)). Liver Function Tests: Recent Labs  Lab 11/01/17 0835  AST 30  ALT 28  ALKPHOS 80  BILITOT 0.9  PROT 7.1  ALBUMIN 3.2*   Recent Labs  Lab 11/01/17 [phone]  LIPASE 27   No results for input(s): AMMONIA in the last 168 hours. Coagulation Profile: No results for input(s): INR, PROTIME in the last 168 hours. Cardiac Enzymes: No results for input(s): CKTOTAL, CKMB, CKMBINDEX, TROPONINI in the last 168 hours. BNP (last 3 results) No results for input(s): PROBNP in the last 8760 hours. HbA1C: No results for input(s): HGBA1C in the last 72 hours. CBG: No  results for input(s): GLUCAP in the last 168 hours. Lipid Profile: No results for input(s): CHOL, HDL, LDLCALC, TRIG, CHOLHDL, LDLDIRECT in the last 72 hours. Thyroid Function Tests: No results for input(s): TSH, T4TOTAL, FREET4, T3FREE, THYROIDAB in the last 72 hours. Anemia Panel: No results for input(s): VITAMINB12, FOLATE, FERRITIN, TIBC, IRON, RETICCTPCT in the last 72 hours. Urine analysis:    Component Value Date/Time   COLORURINE AMBER (A) 11/01/2017 1445   APPEARANCEUR CLOUDY (A) 11/01/2017 1445   LABSPEC 1.011 11/01/2017 1445   PHURINE 6.0 11/01/2017 1445   GLUCOSEU NEGATIVE 11/01/2017 1445   HGBUR NEGATIVE 11/01/2017 1445   BILIRUBINUR NEGATIVE 11/01/2017 1445   KETONESUR NEGATIVE 11/01/2017 1445   PROTEINUR 100 (A) 11/01/2017 1445   NITRITE NEGATIVE 11/01/2017 1445   LEUKOCYTESUR LARGE (A) 11/01/2017 1445    Creatinine Clearance: Estimated Creatinine Clearance: 19 mL/min (A) (by C-G formula based on SCr of 2.3 mg/dL (H)).  Sepsis Labs: @LABRCNTIP (procalcitonin:4,lacticidven:4) )No results found for this or any previous visit (from the past 240 hour(s)).   Radiological Exams on Admission: No results found.  EKG: Independently reviewed.   Assessment/Plan Active Problems:   Hyponatremia   1] epigastric pain probably secondary to gastritis caused by antibiotics versus steroids.  Continue Protonix 40 mg twice a day.  Will let [facility] know of patient's admission.  Patient clearly has epigastric tenderness which is causing her abdominal pain and that was the main reason for admission.  CT of the abdomen and pelvis was done without contrast showed right greater than left perinephric stranding, no evidence of obstructive uropathy, consider pyelonephritis if there is consistent clinical findings, no evidence of bowel obstruction or inflammation.  2] hyponatremia secondary to decreased p.o. intake we will treat her with normal saline.  Follow-up labs tomorrow check urine  and serum osmolality and urine sodium.  3] AKI creatinine up to 2.30 baseline 0.7.  She is not on any nephrotoxic drugs that I can see.  This appears to be due to dehydration.  Hydrate her overnight and recheck.  What is concerning is a creatinine going up to 2.30 after 2 L of IV hydration.  4] UTI/pyelonephroitis-will giveRocephin and follow-up culture.  Lactic acid initially was 1.10 down to 0.97 with hydration.  5] history of lupus on prednisone for lupus flare continue Plaquenil.  Taper steroids.     DVT prophylaxis: Lovenox full code code Status:  Family Communication: Husband in the room Disposition Plan: TBD Consults called: will call [facility] Admission status: Inpatient.   Georgette Shell MD [facility]  If 7PM-7AM, please contact night-coverage [email or web address] Password [facility]  11/01/2017, 4:33 PM

## 2017-11-02 LAB — BASIC METABOLIC PANEL
ANION GAP: 7 (ref 5–15)
BUN: 28 mg/dL — ABNORMAL HIGH (ref 8–23)
CHLORIDE: 102 mmol/L (ref 98–111)
CO2: 20 mmol/L — AB (ref 22–32)
CREATININE: 1.98 mg/dL — AB (ref 0.44–1.00)
Calcium: 6.9 mg/dL — ABNORMAL LOW (ref 8.9–10.3)
GFR calc non Af Amer: 25 mL/min — ABNORMAL LOW (ref >60)
GFR, EST AFRICAN AMERICAN: 29 mL/min — AB (ref >60)
Glucose, Bld: 58 mg/dL — ABNORMAL LOW (ref 70–99)
POTASSIUM: 4.1 mmol/L (ref 3.5–5.1)
SODIUM: 129 mmol/L — AB (ref 135–145)

## 2017-11-02 LAB — CBC
HEMATOCRIT: 30.1 % — AB (ref 36.0–46.0)
HEMOGLOBIN: 9.9 g/dL — AB (ref 12.0–15.0)
MCH: 29.6 pg (ref 26.0–34.0)
MCHC: 32.9 g/dL (ref 30.0–36.0)
MCV: 90.1 fL (ref 78.0–100.0)
Platelets: 177 10*3/uL (ref 150–400)
RBC: 3.34 MIL/uL — AB (ref 3.87–5.11)
RDW: 12.7 % (ref 11.5–15.5)
WBC: 26 10*3/uL — AB (ref 4.0–10.5)

## 2017-11-02 LAB — SODIUM, URINE, RANDOM: SODIUM UR: 39 mmol/L

## 2017-11-02 LAB — OSMOLALITY: OSMOLALITY: 275 mosm/kg (ref 275–295)

## 2017-11-02 MED ORDER — SODIUM CHLORIDE 0.9 % IV SOLN
INTRAVENOUS | Status: AC
  Administered 2017-11-02 (×2): via INTRAVENOUS

## 2017-11-02 NOTE — Progress Notes (Addendum)
PROGRESS NOTE    Sheila Morales  NFA:[id number] DOB: 1950/05/17 DOA: 11/01/2017 PCP: Hoyt Koch, MD Brief Narrative: 67 y.o. female with medical history significant of lupus on plaquenil rheumatic fever,ibs GERD admitted with abdominal pain for one week.  Patient does have a history of chronic abdominal pain.  She feels this pain is somewhat chronic but has gotten worse in the last few days since she started taking Cipro for a UTI.  She denies any nausea vomiting but has decreased appetite and decreased p.o. intake.  She did not have a bowel movement for 3 days prior to coming in to the hospital.  When she got the contrast with a CT she had multiple bowel movements.  No hematuria hematochezia or hematemesis.  Family reported that she had a fever of 101.0 at home.  She is followed by [facility] and had an appointment in the next 1 week.  Patient is also taking prednisone 40 mg for a lupus flare along with Protonix 40 mg twice a day.  She does have a history of COPD does not have oxygen at home.    ED Course: received 2 lit NS.  Labs were significant for sodium of 120 6 repeat was 125 BUN 27 first creatinine was 1.8 4 repeat was 2.30.  Lactic acid level was 1.1 0 repeat was 0.97 hemoglobin 12.2 white count 18.9 platelet count 210.  UA was positive for large amount of leukocytes.  Signs 05/06/1965 temperature 98 pulse was 92 respiration 18.     Assessment & Plan:   Active Problems:   Hyponatremia 1] epigastric pain probably secondary to gastritis caused by antibiotics versus steroids.  Continue Protonix 40 mg twice a day.  Patient seems to be stable today.  Her appetite is back and she is asking for food.  Had an episode of nausea and vomiting overnight but nothing this morning.  CT of the abdomen and pelvis was done without contrast showed right greater than left perinephric stranding, no evidence of obstructive uropathy, consider pyelonephritis if there is consistent clinical findings,  no evidence of bowel obstruction or inflammation.  Follow-up with [facility] upon discharge.  She does have a follow-up appointment with them.  2] hyponatremia secondary to decreased p.o. intake we will treat her with normal saline.    Sodium improved to 129 from 125.  Continue normal saline.   3] AKI creatinine down to 1.98 from 2.30 with IV hydration.   4] UTI/pyelonephroitis-will giveRocephin and follow-up culture.  Lactic acid initially was 1.10 down to 0.97 with hydration.  5] history of lupus on prednisone for lupus flare continue Plaquenil.  Taper steroids.  She was started on steroids as an outpatient.  6] leukocytosis follow-up urine culture.patient is also on prednisone which is contributing.       DVT prophylaxis: LOVENOX Code Status: Full code Family Communication no family available today Disposition Plan: I will obtain PT evaluation she probably can be discharged 11/04/2017 if she remains stable and better AND urine culture pending. Consultants:  none Procedures: None Antimicrobials: Rocephin  Subjective: Feels better nausea vomiting resolved abdominal pain is better   Objective: Vitals:   11/01/17 1757 11/01/17 2143 11/01/17 2200 11/02/17 0343  BP: 113/65 (!) 106/53  (!) 144/81  Pulse: 89 84  73  Resp: 18 (!) 22  (!) 22  Temp: 98.4 F (36.9 C) (!) 97.5 F (36.4 C)  98.2 F (36.8 C)  TempSrc: Oral Oral  Oral  SpO2: 96% Marland Kitchen)  58% 95% 95%  Weight:      Height:        Intake/Output Summary (Last 24 hours) at 11/02/2017 1048 Last data filed at 11/02/2017 0747 Gross per 24 hour  Intake 3893.62 ml  Output 103 ml  Net 3790.62 ml   Filed Weights   11/01/17 1757  Weight: 60.5 kg (133 lb 6.1 oz)    Examination:  General exam: Appears calm and comfortable  Respiratory system: Clear to auscultation. Respiratory effort normal. Cardiovascular system: S1 & S2 heard, RRR. No JVD, murmurs, rubs, gallops or clicks. No pedal edema. Gastrointestinal system:  Abdomen is nondistended, soft and NON TENDER. No organomegaly or masses felt. Normal bowel sounds heard. Central nervous system: Alert and oriented. No focal neurological deficits. Extremities: Symmetric 5 x 5 power. Skin: No rashes, lesions or ulcers Psychiatry: Judgement and insight appear normal. Mood & affect appropriate.     Data Reviewed: I have personally reviewed following labs and imaging studies  CBC: Recent Labs  Lab 11/01/17 0835 11/01/17 1514 11/02/17 0451  WBC 18.9*  --  26.0*  HGB 12.2 11.6* 9.9*  HCT 37.1 34.0* 30.1*  MCV 88.1  --  90.1  PLT 210  --  660   Basic Metabolic Panel: Recent Labs  Lab 11/01/17 0835 11/01/17 1514 11/02/17 0451  NA 126* 125* 129*  K 5.0 4.8 4.1  CL 92* 95* 102  CO2 24  --  20*  GLUCOSE 100* 67* 58*  BUN 23 27* 28*  CREATININE 1.84* 2.30* 1.98*  CALCIUM 9.1  --  6.9*   GFR: Estimated Creatinine Clearance: 23.3 mL/min (A) (by C-G formula based on SCr of 1.98 mg/dL (H)). Liver Function Tests: Recent Labs  Lab 11/01/17 0835  AST 30  ALT 28  ALKPHOS 80  BILITOT 0.9  PROT 7.1  ALBUMIN 3.2*   Recent Labs  Lab 11/01/17 0835  LIPASE 27   No results for input(s): AMMONIA in the last 168 hours. Coagulation Profile: No results for input(s): INR, PROTIME in the last 168 hours. Cardiac Enzymes: No results for input(s): CKTOTAL, CKMB, CKMBINDEX, TROPONINI in the last 168 hours. BNP (last 3 results) No results for input(s): PROBNP in the last 8760 hours. HbA1C: No results for input(s): HGBA1C in the last 72 hours. CBG: No results for input(s): GLUCAP in the last 168 hours. Lipid Profile: No results for input(s): CHOL, HDL, LDLCALC, TRIG, CHOLHDL, LDLDIRECT in the last 72 hours. Thyroid Function Tests: No results for input(s): TSH, T4TOTAL, FREET4, T3FREE, THYROIDAB in the last 72 hours. Anemia Panel: No results for input(s): VITAMINB12, FOLATE, FERRITIN, TIBC, IRON, RETICCTPCT in the last 72 hours. Sepsis Labs: Recent  Labs  Lab 11/01/17 1338 11/01/17 1519  LATICACIDVEN 1.10 0.97    No results found for this or any previous visit (from the past 240 hour(s)).       Radiology Studies: Ct Abdomen Pelvis Wo Contrast  Result Date: 11/01/2017 CLINICAL DATA:  Diffuse abdominal pain for the last week. Also having constipation. Hx: cholecystectomy, colitis EXAM: CT ABDOMEN AND PELVIS WITHOUT CONTRAST TECHNIQUE: Multidetector CT imaging of the abdomen and pelvis was performed following the standard protocol without IV contrast. COMPARISON:  09/19/2017 FINDINGS: Lower chest: Linear and reticular type opacities are noted at the lung bases, increased from the prior study, consistent with subsegmental atelectasis. No convincing pneumonia. No pulmonary edema. Heart is normal in size. Hepatobiliary: Decreased attenuation of the liver consistent with fatty infiltration. No liver mass or focal lesion. Gallbladder surgically absent. No  bile duct dilation. Pancreas: Unremarkable. No pancreatic ductal dilatation or surrounding inflammatory changes. Spleen: Normal in size without focal abnormality. Adrenals/Urinary Tract: No adrenal masses. Right kidney mildly displaced inferiorly. There is bilateral perinephric stranding that is greater on the right, increased when compared the prior CT. No renal masses. No stones. No hydronephrosis. Ureters are normal in course and caliber. No ureteral stones. Bladder is decompressed but otherwise unremarkable. Stomach/Bowel: Stomach is within normal limits. Appendix appears normal. No evidence of bowel wall thickening, distention, or inflammatory changes. There are colonic diverticula without diverticulitis. No increase in colonic stool. Vascular/Lymphatic: Aortic atherosclerosis. No enlarged abdominal or pelvic lymph nodes. Reproductive: Uterus and bilateral adnexa are unremarkable. Other: No abdominal wall hernia or abnormality. No abdominopelvic ascites. Musculoskeletal: No fracture or acute  finding. No osteoblastic or osteolytic lesions. IMPRESSION: 1. Right greater than left perinephric stranding increased when compared to the prior CT. No evidence of obstructive uropathy. Consider pyelonephritis if there are consistent clinical findings. 2. No evidence of bowel obstruction or inflammation. No increase in stool. 3. Hepatic steatosis. 4. Aortic atherosclerosis. Electronically Signed   By: Lajean Manes M.D.   On: 11/01/2017 17:03        Scheduled Meds: . aspirin EC  81 mg Oral Daily  . cholecalciferol  1,000 Units Oral Daily  . cycloSPORINE  1 drop Both Eyes BID  . enoxaparin (LOVENOX) injection  30 mg Subcutaneous Q24H  . gabapentin  300 mg Oral TID  . hydroxychloroquine  200 mg Oral BID  . ondansetron (ZOFRAN) IV  4 mg Intravenous TID  . predniSONE  30 mg Oral Q breakfast   Continuous Infusions: . sodium chloride 150 mL/hr at 11/02/17 0710  . cefTRIAXone (ROCEPHIN)  IV 1 g (11/01/17 1913)     LOS: 1 day     Georgette Shell, Md  If 7PM-7AM, please contact night-coverage [email or web address] Password [facility] 11/02/2017, 10:48 AM

## 2017-11-02 NOTE — Evaluation (Signed)
Physical Therapy Evaluation Patient Details Name: Sheila Morales MRN: [id number] DOB: July 25, 1950 Today's Date: 11/02/2017   History of Present Illness  67 y.o. female with medical history significant of lupus on plaquenil rheumatic fever,ibs GERD and COPD admitted with abdominal pain for one week.  She feels this pain is somewhat chronic but has gotten worse in the last few days since she started taking Cipro for a UTI. 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 Pt currently limited in her safe mobility by decreased balance especially in walking, which is worse when she first starts ambulating. Pt reports she has had balance issues since her hearing loss. Pt currently mod I for bed mobility and sit>stand, and min guard for ambulation of 500 feet with RW. Pt encouraged to utilize RW at home especially when she first starts moving. PT recommends Outpatient Neuro PT to address her balance and stability deficits. PT will continue to follow acutely.     Follow Up Recommendations Outpatient PT(neuro therapy for balance deficits 2' hearing loss)    Equipment Recommendations  None recommended by PT    Recommendations for Other Services       Precautions / Restrictions Precautions Precautions: None Restrictions Weight Bearing Restrictions: No      Mobility  Bed Mobility Overal bed mobility: Modified Independent                Transfers Overall transfer level: Modified independent Equipment used: None             General transfer comment: pt with c/o of mild imbalance   Ambulation/Gait Ambulation/Gait assistance: Min guard Gait Distance (Feet): 550 Feet Assistive device: None;Rolling walker (2 wheeled) Gait Pattern/deviations: Step-through pattern;Shuffle Gait velocity: slowed Gait velocity interpretation: 1.31 - 2.62 ft/sec, indicative of limited community ambulator General  Gait Details: initially ambulated without AD with moderate unsteadiness and reaching out for sink. Pt noted that since she lost her hearing she has been unstable when she first gets up, ambulated remainder of the distance with RW and pt with increased stability, vc for proximity to RW, pt with 3/4 DoE upon returning to room from ambulation      Balance Overall balance assessment: Needs assistance Sitting-balance support: Feet supported;No upper extremity supported Sitting balance-Leahy Scale: Fair     Standing balance support: No upper extremity supported;During functional activity Standing balance-Leahy Scale: Fair                               Pertinent Vitals/Pain Pain Assessment: No/denies pain    Home Living Family/patient expects to be discharged to:: Private residence Living Arrangements: Spouse/significant other Available Help at Discharge: Family;Available 24 hours/day Type of Home: House Home Access: Stairs to enter Entrance Stairs-Rails: None Entrance Stairs-Number of Steps: 1 Home Layout: One level Home Equipment: Environmental consultant - 2 wheels      Prior Function Level of Independence: Independent         Comments: cleans houses        Extremity/Trunk Assessment   Upper Extremity Assessment Upper Extremity Assessment: Generalized weakness    Lower Extremity Assessment Lower Extremity Assessment: Generalized weakness       Communication   Communication: No difficulties  Cognition Arousal/Alertness: Awake/alert Behavior During Therapy: WFL for tasks assessed/performed Overall Cognitive Status: Within Functional Limits for tasks assessed  General Comments General comments (skin integrity, edema, etc.): 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 Pt ambulated on RA however by end of ambulation was  SoB, SaO2 on RA 90%O2. educated on need to fully exhale due to COPD         Assessment/Plan    PT Assessment Patient needs continued PT services  PT Problem List Decreased balance;Decreased mobility;Decreased knowledge of use of DME;Decreased safety awareness       PT Treatment Interventions      PT Goals (Current goals can be found in the Care Plan section)  Acute Rehab PT Goals Patient Stated Goal: get something to eat PT Goal Formulation: With patient Time For Goal Achievement: 11/16/17 Potential to Achieve Goals: Good    Frequency Min 3X/week    AM-PAC PT "6 Clicks" Daily Activity  Outcome Measure Difficulty turning over in bed (including adjusting bedclothes, sheets and blankets)?: A Little Difficulty moving from lying on back to sitting on the side of the bed? : A Little Difficulty sitting down on and standing up from a chair with arms (e.g., wheelchair, bedside commode, etc,.)?: A Little Help needed moving to and from a bed to chair (including a wheelchair)?: A Little Help needed walking in hospital room?: A Little Help needed climbing 3-5 steps with a railing? : A Little 6 Click Score: 18    End of Session Equipment Utilized During Treatment: Gait belt Activity Tolerance: Patient tolerated treatment well Patient left: in bed;with call bell/phone within reach;with bed alarm set;with family/visitor present Nurse Communication: Mobility status PT Visit Diagnosis: Unsteadiness on feet (R26.81);Other abnormalities of gait and mobility (R26.89);Difficulty in walking, not elsewhere classified (R26.2);Other symptoms and signs involving the nervous system (Z22.482)    Time: 5003-7048 PT Time Calculation (min) (ACUTE ONLY): 20 min   Charges:   PT Evaluation $PT Eval Moderate Complexity: 1 Mod     PT G Codes:        Berdia Lachman B. Migdalia Dk PT, DPT Acute Rehabilitation  [phone] Pager [phone]    [place] 11/02/2017, 5:34 PM

## 2017-11-03 ENCOUNTER — Inpatient Hospital Stay (HOSPITAL_COMMUNITY): Payer: Medicare Other

## 2017-11-03 DIAGNOSIS — F17200 Nicotine dependence, unspecified, uncomplicated: Secondary | ICD-10-CM

## 2017-11-03 DIAGNOSIS — I340 Nonrheumatic mitral (valve) insufficiency: Secondary | ICD-10-CM

## 2017-11-03 LAB — CBC
HCT: 34.4 % — ABNORMAL LOW (ref 36.0–46.0)
Hemoglobin: 11.5 g/dL — ABNORMAL LOW (ref 12.0–15.0)
MCH: 29.3 pg (ref 26.0–34.0)
MCHC: 33.4 g/dL (ref 30.0–36.0)
MCV: 87.8 fL (ref 78.0–100.0)
PLATELETS: 205 10*3/uL (ref 150–400)
RBC: 3.92 MIL/uL (ref 3.87–5.11)
RDW: 13 % (ref 11.5–15.5)
WBC: 28.8 10*3/uL — ABNORMAL HIGH (ref 4.0–10.5)

## 2017-11-03 LAB — GLUCOSE, CAPILLARY: Glucose-Capillary: 99 mg/dL (ref 70–99)

## 2017-11-03 LAB — BASIC METABOLIC PANEL
Anion gap: 12 (ref 5–15)
BUN: 30 mg/dL — ABNORMAL HIGH (ref 8–23)
CALCIUM: 8.7 mg/dL — AB (ref 8.9–10.3)
CO2: 18 mmol/L — AB (ref 22–32)
CREATININE: 1.85 mg/dL — AB (ref 0.44–1.00)
Chloride: 100 mmol/L (ref 98–111)
GFR calc Af Amer: 31 mL/min — ABNORMAL LOW (ref >60)
GFR, EST NON AFRICAN AMERICAN: 27 mL/min — AB (ref >60)
Glucose, Bld: 108 mg/dL — ABNORMAL HIGH (ref 70–99)
Potassium: 4.1 mmol/L (ref 3.5–5.1)
Sodium: 130 mmol/L — ABNORMAL LOW (ref 135–145)

## 2017-11-03 LAB — ECHOCARDIOGRAM COMPLETE
HEIGHTINCHES: 62 in
Weight: 2134.05 oz

## 2017-11-03 LAB — MRSA PCR SCREENING: MRSA by PCR: NEGATIVE

## 2017-11-03 MED ORDER — SODIUM BICARBONATE 650 MG PO TABS
650.0000 mg | ORAL_TABLET | Freq: Two times a day (BID) | ORAL | Status: DC
  Administered 2017-11-03 – 2017-11-05 (×5): 650 mg via ORAL
  Filled 2017-11-03 (×5): qty 1

## 2017-11-03 MED ORDER — IPRATROPIUM-ALBUTEROL 0.5-2.5 (3) MG/3ML IN SOLN
3.0000 mL | Freq: Three times a day (TID) | RESPIRATORY_TRACT | Status: DC
  Administered 2017-11-04 – 2017-11-08 (×11): 3 mL via RESPIRATORY_TRACT
  Filled 2017-11-03 (×13): qty 3

## 2017-11-03 MED ORDER — IPRATROPIUM-ALBUTEROL 0.5-2.5 (3) MG/3ML IN SOLN
3.0000 mL | Freq: Four times a day (QID) | RESPIRATORY_TRACT | Status: DC
  Administered 2017-11-03 (×3): 3 mL via RESPIRATORY_TRACT
  Filled 2017-11-03 (×3): qty 3

## 2017-11-03 MED ORDER — ACETAMINOPHEN 325 MG PO TABS
650.0000 mg | ORAL_TABLET | Freq: Four times a day (QID) | ORAL | Status: DC | PRN
  Administered 2017-11-03 – 2017-11-07 (×4): 650 mg via ORAL
  Filled 2017-11-03 (×4): qty 2

## 2017-11-03 MED ORDER — PANTOPRAZOLE SODIUM 40 MG PO TBEC
40.0000 mg | DELAYED_RELEASE_TABLET | Freq: Two times a day (BID) | ORAL | Status: DC
  Administered 2017-11-03 – 2017-11-08 (×10): 40 mg via ORAL
  Filled 2017-11-03 (×10): qty 1

## 2017-11-03 MED ORDER — GUAIFENESIN ER 600 MG PO TB12
600.0000 mg | ORAL_TABLET | Freq: Two times a day (BID) | ORAL | Status: DC
  Administered 2017-11-03 – 2017-11-08 (×11): 600 mg via ORAL
  Filled 2017-11-03 (×11): qty 1

## 2017-11-03 NOTE — Progress Notes (Signed)
  Echocardiogram 2D Echocardiogram has been performed.  Sheila Morales 11/03/2017, 2:08 PM

## 2017-11-03 NOTE — Progress Notes (Signed)
PROGRESS NOTE  Sheila Morales QIW:[id number] DOB: 12/07/1950 DOA: 11/01/2017 PCP: Hoyt Koch, MD  HPI/Recap of past 24 hours:  Sob, wheezing, cough, hypoxia 87% on room air  Assessment/Plan: Active Problems:   Hyponatremia  Acute Hypoxic respiratory failure, suspect underline chronic hypxia Get echo cxr with interstitial changes, will get ct chest , she dose has h/o lupus, need to r/o interstitial lung disease She likely will need home o2 She will need pulmonology follow up  AKI: She did reports significant weight loss, poor oral intake for the last two months [place] no cast, no blood, + protein Could be prerenal   Metabolic acidosis  In the setting of aki Start on sodium bicarb  Hyponatremia From dehydration, improved on hydration  Leukocytosis: She is recently started on a 12 day taper of steroids She is on rocephin to cover possible uti, urine culture pending, will get blood culture as well  Lupus: with recent flare up, diffuse skin rash, she reports improved on steroids, she is half way through the 12 day course of steroids   Smoker, smoking cessation education provided> 57mins, nicotine patch  Body mass index is 24.4 kg/m.    Code Status: full  Family Communication: patient   Disposition Plan: she is hopefull for discharge to make it to the trip to [place] for a family gathering on 7/19   Consultants:  none  Procedures:  none  Antibiotics:  rocephin   Objective: BP (!) 149/77 (BP Location: Left Arm)   Pulse 86   Temp 98.2 F (36.8 C) (Oral)   Resp (!) 22   Ht 5\' 2"  (1.575 m)   Wt 60.5 kg (133 lb 6.1 oz)   SpO2 98%   BMI 24.40 kg/m   Intake/Output Summary (Last 24 hours) at 11/03/2017 0813 Last data filed at 11/03/2017 4174 Gross per 24 hour  Intake 499.88 ml  Output 950 ml  Net -450.12 ml   Filed Weights   11/01/17 1757  Weight: 60.5 kg (133 lb 6.1 oz)    Exam: Patient is examined daily including today on 11/03/2017,  exams remain the same as of yesterday except that has changed    General:  NAD  Cardiovascular: RRR  Respiratory: some crackles, no wheezing, no rhonchi  Abdomen: Soft/ND/NT, positive BS  Musculoskeletal: No Edema  Neuro: alert, oriented   Skin: dry ,diffuse rash, reports is improving  Data Reviewed: Basic Metabolic Panel: Recent Labs  Lab 11/01/17 0835 11/01/17 1514 11/02/17 0451  NA 126* 125* 129*  K 5.0 4.8 4.1  CL 92* 95* 102  CO2 24  --  20*  GLUCOSE 100* 67* 58*  BUN 23 27* 28*  CREATININE 1.84* 2.30* 1.98*  CALCIUM 9.1  --  6.9*   Liver Function Tests: Recent Labs  Lab 11/01/17 0835  AST 30  ALT 28  ALKPHOS 80  BILITOT 0.9  PROT 7.1  ALBUMIN 3.2*   Recent Labs  Lab 11/01/17 0835  LIPASE 27   No results for input(s): AMMONIA in the last 168 hours. CBC: Recent Labs  Lab 11/01/17 0835 11/01/17 1514 11/02/17 0451  WBC 18.9*  --  26.0*  HGB 12.2 11.6* 9.9*  HCT 37.1 34.0* 30.1*  MCV 88.1  --  90.1  PLT 210  --  177   Cardiac Enzymes:   No results for input(s): CKTOTAL, CKMB, CKMBINDEX, TROPONINI in the last 168 hours. BNP (last 3 results) No results for input(s): BNP in the last 8760 hours.  ProBNP (last  3 results) No results for input(s): PROBNP in the last 8760 hours.  CBG: No results for input(s): GLUCAP in the last 168 hours.  No results found for this or any previous visit (from the past 240 hour(s)).   Studies: No results found.  Scheduled Meds: . aspirin EC  81 mg Oral Daily  . cholecalciferol  1,000 Units Oral Daily  . cycloSPORINE  1 drop Both Eyes BID  . enoxaparin (LOVENOX) injection  30 mg Subcutaneous Q24H  . gabapentin  300 mg Oral TID  . guaiFENesin  600 mg Oral BID  . hydroxychloroquine  200 mg Oral BID  . ipratropium-albuterol  3 mL Nebulization Q6H  . pantoprazole  40 mg Oral BID AC  . predniSONE  30 mg Oral Q breakfast    Continuous Infusions: . cefTRIAXone (ROCEPHIN)  IV 1 g (11/02/17 1724)     Time  spent: 62mins I have personally reviewed and interpreted on  11/03/2017 daily labs, tele strips, imagings as discussed above under date review session and assessment and plans.  I reviewed all nursing notes, pharmacy notes, consultant notes,  vitals, pertinent old records  I have discussed plan of care as described above with RN , patient and family on 11/03/2017   Florencia Reasons MD, PhD  [facility] Pager [phone]. If 7PM-7AM, please contact night-coverage at [email or web address], password [facility] 11/03/2017, 8:13 AM  LOS: 2 days

## 2017-11-03 NOTE — Progress Notes (Signed)
SATURATION QUALIFICATIONS: (This note is used to comply with regulatory documentation for home oxygen)  Patient Saturations on Room Air at Rest = 100%  Patient Saturations on Room Air while Ambulating = 76%  Patient Saturations on 4 Liters of oxygen while Ambulating = 92%

## 2017-11-04 ENCOUNTER — Telehealth: Payer: Self-pay | Admitting: Internal Medicine

## 2017-11-04 ENCOUNTER — Inpatient Hospital Stay (HOSPITAL_COMMUNITY): Payer: Medicare Other

## 2017-11-04 LAB — BASIC METABOLIC PANEL
Anion gap: 8 (ref 5–15)
BUN: 29 mg/dL — ABNORMAL HIGH (ref 8–23)
CALCIUM: 8.6 mg/dL — AB (ref 8.9–10.3)
CO2: 20 mmol/L — ABNORMAL LOW (ref 22–32)
Chloride: 104 mmol/L (ref 98–111)
Creatinine, Ser: 1.64 mg/dL — ABNORMAL HIGH (ref 0.44–1.00)
GFR, EST AFRICAN AMERICAN: 36 mL/min — AB (ref >60)
GFR, EST NON AFRICAN AMERICAN: 31 mL/min — AB (ref >60)
GLUCOSE: 91 mg/dL (ref 70–99)
POTASSIUM: 4.6 mmol/L (ref 3.5–5.1)
Sodium: 132 mmol/L — ABNORMAL LOW (ref 135–145)

## 2017-11-04 LAB — CBC
HEMATOCRIT: 28.6 % — AB (ref 36.0–46.0)
Hemoglobin: 9.7 g/dL — ABNORMAL LOW (ref 12.0–15.0)
MCH: 29.4 pg (ref 26.0–34.0)
MCHC: 33.9 g/dL (ref 30.0–36.0)
MCV: 86.7 fL (ref 78.0–100.0)
PLATELETS: 215 10*3/uL (ref 150–400)
RBC: 3.3 MIL/uL — AB (ref 3.87–5.11)
RDW: 13.1 % (ref 11.5–15.5)
WBC: 25.2 10*3/uL — AB (ref 4.0–10.5)

## 2017-11-04 LAB — RESPIRATORY PANEL BY PCR
Adenovirus: NOT DETECTED
Bordetella pertussis: NOT DETECTED
CHLAMYDOPHILA PNEUMONIAE-RVPPCR: NOT DETECTED
CORONAVIRUS 229E-RVPPCR: NOT DETECTED
CORONAVIRUS NL63-RVPPCR: NOT DETECTED
CORONAVIRUS OC43-RVPPCR: NOT DETECTED
Coronavirus HKU1: NOT DETECTED
INFLUENZA A-RVPPCR: NOT DETECTED
Influenza B: NOT DETECTED
MYCOPLASMA PNEUMONIAE-RVPPCR: NOT DETECTED
Metapneumovirus: NOT DETECTED
PARAINFLUENZA VIRUS 1-RVPPCR: NOT DETECTED
PARAINFLUENZA VIRUS 4-RVPPCR: NOT DETECTED
Parainfluenza Virus 2: NOT DETECTED
Parainfluenza Virus 3: NOT DETECTED
Respiratory Syncytial Virus: NOT DETECTED
Rhinovirus / Enterovirus: NOT DETECTED

## 2017-11-04 LAB — PROCALCITONIN: Procalcitonin: 5.74 ng/mL

## 2017-11-04 LAB — URINE CULTURE: Culture: NO GROWTH

## 2017-11-04 LAB — STREP PNEUMONIAE URINARY ANTIGEN: STREP PNEUMO URINARY ANTIGEN: NEGATIVE

## 2017-11-04 MED ORDER — SODIUM CHLORIDE 0.9 % IV SOLN
INTRAVENOUS | Status: DC
  Administered 2017-11-04 – 2017-11-07 (×2): via INTRAVENOUS

## 2017-11-04 MED ORDER — LEVALBUTEROL TARTRATE 45 MCG/ACT IN AERO: 2.0000 | INHALATION_SPRAY | Freq: Four times a day (QID) | RESPIRATORY_TRACT | Status: DC | PRN

## 2017-11-04 MED ORDER — SODIUM CHLORIDE 0.9 % IV SOLN
INTRAVENOUS | Status: DC
  Administered 2017-11-04: 14:00:00 via INTRAVENOUS

## 2017-11-04 MED ORDER — PIPERACILLIN-TAZOBACTAM 3.375 G IVPB
3.3750 g | Freq: Three times a day (TID) | INTRAVENOUS | Status: DC
  Administered 2017-11-04 – 2017-11-06 (×6): 3.375 g via INTRAVENOUS
  Filled 2017-11-04 (×8): qty 50

## 2017-11-04 MED ORDER — CLONAZEPAM 0.5 MG PO TABS
0.2500 mg | ORAL_TABLET | Freq: Three times a day (TID) | ORAL | Status: DC | PRN
  Administered 2017-11-04: 0.25 mg via ORAL
  Filled 2017-11-04: qty 1

## 2017-11-04 MED ORDER — LEVALBUTEROL HCL 0.63 MG/3ML IN NEBU
0.6300 mg | INHALATION_SOLUTION | Freq: Four times a day (QID) | RESPIRATORY_TRACT | Status: DC | PRN
  Administered 2017-11-04 – 2017-11-06 (×4): 0.63 mg via RESPIRATORY_TRACT
  Filled 2017-11-04 (×5): qty 3

## 2017-11-04 MED ORDER — VANCOMYCIN HCL 500 MG IV SOLR
500.0000 mg | INTRAVENOUS | Status: DC
  Administered 2017-11-05: 500 mg via INTRAVENOUS
  Filled 2017-11-04 (×2): qty 500

## 2017-11-04 MED ORDER — VANCOMYCIN HCL IN DEXTROSE 1-5 GM/200ML-% IV SOLN
1000.0000 mg | Freq: Once | INTRAVENOUS | Status: AC
  Administered 2017-11-04: 1000 mg via INTRAVENOUS
  Filled 2017-11-04: qty 200

## 2017-11-04 MED ORDER — CLONAZEPAM 0.125 MG PO TBDP
0.2500 mg | ORAL_TABLET | Freq: Three times a day (TID) | ORAL | Status: DC | PRN
  Administered 2017-11-05 – 2017-11-08 (×7): 0.25 mg via ORAL
  Filled 2017-11-04 (×7): qty 2

## 2017-11-04 MED ORDER — PREDNISONE 20 MG PO TABS
20.0000 mg | ORAL_TABLET | Freq: Every day | ORAL | Status: DC
  Administered 2017-11-05 – 2017-11-08 (×4): 20 mg via ORAL
  Filled 2017-11-04 (×4): qty 1

## 2017-11-04 NOTE — Progress Notes (Signed)
ANTIBIOTIC CONSULT NOTE - INITIAL  Pharmacy  Consult for Vancomycin and Zosyn Indication: Pneumonia  No Known Allergies  Patient Measurements: Height: 5\' 2"  (157.5 cm) Weight: 133 lb 6.1 oz (60.5 kg) IBW/kg (Calculated) : 50.1   Vital Signs: Temp: 97.6 F (36.4 C) (07/16 1006) Temp Source: Oral (07/16 1006) BP: 122/61 (07/16 1006) Pulse Rate: 120 (07/16 1006) Intake/Output from previous day: 07/15 0701 - 07/16 0700 In: 600 [P.O.:600] Out: 2050 [Urine:2050] Intake/Output from this shift: Total I/O In: -  Out: 700 [Urine:700]  Labs: Recent Labs    11/02/17 0451 11/03/17 0825 11/04/17 0319  WBC 26.0* 28.8* 25.2*  HGB 9.9* 11.5* 9.7*  PLT 177 205 215  CREATININE 1.98* 1.85* 1.64*   Estimated Creatinine Clearance: 28.1 mL/min (A) (by C-G formula based on SCr of 1.64 mg/dL (H)). No results for input(s): VANCOTROUGH, VANCOPEAK, VANCORANDOM, GENTTROUGH, GENTPEAK, GENTRANDOM, TOBRATROUGH, TOBRAPEAK, TOBRARND, AMIKACINPEAK, AMIKACINTROU, AMIKACIN in the last 72 hours.   Microbiology: Recent Results (from the past 720 hour(s))  MRSA PCR Screening     Status: None   Collection Time: 11/03/17  5:32 PM  Result Value Ref Range Status   MRSA by PCR NEGATIVE NEGATIVE Final    Comment:        The GeneXpert MRSA Assay (FDA approved for NASAL specimens only), is one component of a comprehensive MRSA colonization surveillance program. It is not intended to diagnose MRSA infection nor to guide or monitor treatment for MRSA infections. Performed at [facility], [place] [address] [place], [place] [address]     Medical History: Past Medical History:  Diagnosis Date  . Arthritis   . Colitis, ischemic ([place])   . External hemorrhoids   . H/O: rheumatic fever   . IBS (irritable bowel syndrome)   . Personal history colonic adenoma 03/25/2008   06/2007 right sided adenoma and 2 right hyperplastic polyps    . Restless leg syndrome    questionable  . Systemic lupus  erythematosus ([place])   . Tubular adenoma of colon   . 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   Goal of Therapy:  Vancomycin troughs of 15-20 mcg/ml  Plan:  Zosyn 3.375gm IV q8h Vancomycin 1000 mg x 1 , then 500 mg q24h Monitor for LOT, C&S, and clinical course Vancomycin troughs as needed.   Ramal Eckhardt A. Levada Dy, PharmD, [place] Pager: [phone] Please utilize Amion for appropriate phone number to reach the unit pharmacist ([place])    11/04/2017,10:45 AM

## 2017-11-04 NOTE — Telephone Encounter (Signed)
Sheila Morales has SLE with ILD changes. Currently inpatient at [facility] for pneumonia and UTI in midst of steroids for SLE Flare. Please give her first available in ILD clinic to see me. Request from Dr Florencia Reasons hospitalist.   Thanks  Dr. Brand Males, M.D., [facility].C.P Pulmonary and Critical Care Medicine Staff Physician, [place] Director - Interstitial Lung Disease  Program  Pulmonary [place] at [place], [place], [address]  Pager: [phone], If no answer or between  15:00h - 7:00h: call [phone] Telephone: [phone]

## 2017-11-04 NOTE — Telephone Encounter (Signed)
Called and spoke with pt's spouse [name] to let him know about Dr. Erlinda Hong wanting pt to see MR or an appt once out of the hospital.  Per [name], they are hoping pt will be able to be released from the hospital today, 7/16.  Went ahead and scheduled pt a HFU with Dr. Chase Caller Thursday, 7/18 due to pt supposed to be leaving Friday to go to [place] and that way pt has the appt before she goes out of town.  [name] said he would call if pt's appt needed to be changed if pt was not discharged from the hospital before the [place].  Routing to MR as an Pharmacist, hospital.

## 2017-11-04 NOTE — Care Management Important Message (Signed)
Important Message  Patient Details  Name: Sheila Morales MRN: [id number] Date of Birth: 20-May-1950   Medicare Important Message Given:  Yes    Orbie Pyo 11/04/2017, 2:20 PM

## 2017-11-04 NOTE — Progress Notes (Signed)
PT Cancellation Note  Patient Details Name: EYVETTE CORDON MRN: [id number] DOB: May 12, 1950   Cancelled Treatment:    Reason Eval/Treat Not Completed: (P) Medical issues which prohibited therapy Pt with tachycardia, tachypnea and oxygen desaturation with limited activity today at RN request deferment of therapy today. PT will follow back tomorrow.   Maleka Contino B. Migdalia Dk PT, DPT Acute Rehabilitation  [phone] Pager [phone]     [place] 11/04/2017, 11:30 AM

## 2017-11-04 NOTE — Progress Notes (Signed)
RT Note:  Called for PRN nebulizer.  Patient wheezing on exam, SOB and increased WOB.  Nebulizer administered.

## 2017-11-04 NOTE — Progress Notes (Addendum)
Patent having SOB unrelieved with Albuterol tx.Fine crackles auscultated in the middle lobe and diminished in the lower lobes. Patient has been restless with intermittent confusion, has not slept the whole night. Percocet 1 tab PRN administered for c/o generalized pain of 7/10 on a pain scale. [place] NPnotified without any new order but to continue to monitor and report if she gets worse. NP added she will call back and f/u on he condition. Will continue to monitor.

## 2017-11-04 NOTE — Progress Notes (Signed)
PROGRESS NOTE  Sheila Morales:[id number] DOB: 1951-02-14 DOA: 11/01/2017 PCP: Hoyt Koch, MD  Brief Summary  Patient presented to the hospital initially due to failure to thrive, decreased appetite, weight loss.  She reported has been declining for the last 1 and half month.  She had lupus flareup with skin lesions, restarted on a 12-day course of steroid.  She is Found on admission with acute renal failure hypo-natremia and she is started on Rocephin for possible UTI  On July 15 she developed acute hypoxic short of breath, she is started on oxygen supplement.  CT chest obtained.    HPI/Recap of past 24 hours:  Continue to c/o sob, has hypoxia requiring o2 supplement Husband at bedside  Assessment/Plan: Active Problems:   Hyponatremia  Acute Hypoxic respiratory failure ( developed on 7/15),  -suspect underline chronic hypxia -Echo "lvef 53-97%, grade 2 diastolic dysfunction" -cxr with interstitial changes, ct chest concerning for atypical infection vs vasculitis, she dose has h/o lupus, -Procalcitonin at 5.7 - case discussed with pulmonology Dr. Brantley Persons who recommended to treat as healthcare acquired pneumonia with Vanco and Zosyn, get urine Legionella and strep pneumo antigen, get respiratory virus panel, if No improvement will need to call pulmonology again for a formal consult, otherwise outpatient follow-up.  -She likely will need home o2   AKI: -bun 27/cr 2.3 on presentation -She did reports significant weight loss, poor oral intake for the last two months -[place] no cast, no blood, + protein, urine culture no growth. - Could be prerenal  -she received hydration, hydration stopped  Due to c/o sob on 7/15. -Bun/cr 29/1.6 today on 7/16,  -repeat lab in am.  Metabolic acidosis  In the setting of aki Start on sodium bicarb  Hyponatremia Sodium 125 on presentation From dehydration, improving  Leukocytosis: She is recently started on a 12 day taper of  steroids She is on rocephin to cover possible uti, urine culture /blood culture no growth -abx broadened per pulmonology recommendation to vanc/zosyn to cover health care acquired pneumonia on 7/16  Lupus: with recent flare up, diffuse skin rash, she reports improved on steroids, today is day 9 of 12 day course of steroids taper.   Smoker, smoking cessation education provided> 47mins, nicotine patch  Body mass index is 24.4 kg/m.    Code Status: full  Family Communication: patient   Disposition Plan: she is hopefull for discharge to make it to the trip to [place] for a family gathering on 7/19 but family is flexible if plan has to changed due to medical condition   Consultants:  Conversation with pulmonology on July 16  Procedures:  none  Antibiotics:  Rocephin from admission to July 16  vanc and Zosyn from July 16   Objective: BP (!) 152/83 (BP Location: Left Arm)   Pulse (!) 113   Temp 98.5 F (36.9 C) (Oral)   Resp 16   Ht 5\' 2"  (1.575 m)   Wt 60.5 kg (133 lb 6.1 oz)   SpO2 100%   BMI 24.40 kg/m   Intake/Output Summary (Last 24 hours) at 11/04/2017 0839 Last data filed at 11/03/2017 2241 Gross per 24 hour  Intake 600 ml  Output 1750 ml  Net -1150 ml   Filed Weights   11/01/17 1757  Weight: 60.5 kg (133 lb 6.1 oz)    Exam: Patient is examined daily including today on 11/04/2017, exams remain the same as of yesterday except that has changed    General:  Thin and  frail  Cardiovascular: RRR  Respiratory: some crackles, no wheezing, no rhonchi  Abdomen: Soft/ND/NT, positive BS  Musculoskeletal: No Edema  Neuro: alert, oriented   Skin: dry ,diffuse rash, reports is improving  Data Reviewed: Basic Metabolic Panel: Recent Labs  Lab 11/01/17 0835 11/01/17 1514 11/02/17 0451 11/03/17 0825 11/04/17 0319  NA 126* 125* 129* 130* 132*  K 5.0 4.8 4.1 4.1 4.6  CL 92* 95* 102 100 104  CO2 24  --  20* 18* 20*  GLUCOSE 100* 67* 58* 108* 91  BUN  23 27* 28* 30* 29*  CREATININE 1.84* 2.30* 1.98* 1.85* 1.64*  CALCIUM 9.1  --  6.9* 8.7* 8.6*   Liver Function Tests: Recent Labs  Lab 11/01/17 0835  AST 30  ALT 28  ALKPHOS 80  BILITOT 0.9  PROT 7.1  ALBUMIN 3.2*   Recent Labs  Lab 11/01/17 0835  LIPASE 27   No results for input(s): AMMONIA in the last 168 hours. CBC: Recent Labs  Lab 11/01/17 0835 11/01/17 1514 11/02/17 0451 11/03/17 0825 11/04/17 0319  WBC 18.9*  --  26.0* 28.8* 25.2*  HGB 12.2 11.6* 9.9* 11.5* 9.7*  HCT 37.1 34.0* 30.1* 34.4* 28.6*  MCV 88.1  --  90.1 87.8 86.7  PLT 210  --  177 205 215   Cardiac Enzymes:   No results for input(s): CKTOTAL, CKMB, CKMBINDEX, TROPONINI in the last 168 hours. BNP (last 3 results) No results for input(s): BNP in the last 8760 hours.  ProBNP (last 3 results) No results for input(s): PROBNP in the last 8760 hours.  CBG: Recent Labs  Lab 11/03/17 0840  GLUCAP 99    Recent Results (from the past 240 hour(s))  MRSA PCR Screening     Status: None   Collection Time: 11/03/17  5:32 PM  Result Value Ref Range Status   MRSA by PCR NEGATIVE NEGATIVE Final    Comment:        The GeneXpert MRSA Assay (FDA approved for NASAL specimens only), is one component of a comprehensive MRSA colonization surveillance program. It is not intended to diagnose MRSA infection nor to guide or monitor treatment for MRSA infections. Performed at [facility], [place] [address] [place], [place] [address]      Studies: Ct Chest Wo Contrast  Result Date: 11/04/2017 CLINICAL DATA:  67 year old female with shortness of breath for several days. Abnormal chest x-ray. Hyponatremia. Acute renal insufficiency, metabolic acidosis. Recently on a steroid taper. History of lupus, recent cutaneous FLAIR. EXAM: CT CHEST WITHOUT CONTRAST TECHNIQUE: Multidetector CT imaging of the chest was performed following the standard protocol without IV contrast. COMPARISON:  Portable chest  11/03/2017. CT Abdomen and Pelvis 09/19/2017, and more recently 11/01/2017. FINDINGS: Cardiovascular: No cardiomegaly or pericardial effusion. Calcified coronary artery atherosclerosis (series 3, image 93). Vascular patency is not evaluated in the absence of IV contrast. Comparatively mild calcified thoracic and upper abdominal aortic atherosclerosis. Mediastinum/Nodes: Mild to moderate mediastinal lymphadenopathy with individual nodes up to 10 millimeter short axis, and suspected increased hilar nodes although suboptimally evaluated without IV contrast. Lungs/Pleura: New small layering pleural effusions since the CT in May, increased from the recent 11/01/2017 abdomen CT. The major airways are patent. New since May generalized bilateral pulmonary septal thickening with superimposed widespread bilateral upper ground-glass partially solid opacity, resulting in an intermittent crazy paving appearance. Mild middle lobe involvement, more so on the right. No solid pulmonary consolidation at this time. No cavitary changes. There is mild generalized peribronchial thickening.  Upper Abdomen: Stable upper abdomen since 11/01/2017. Musculoskeletal: No acute osseous abnormality identified. Mild pectus excavatum. Occasional mild upper thoracic superior endplate compression which appears chronic. IMPRESSION: 1. Upper lobe predominant generalized pulmonary septal thickening with superimposed ground-glass and patchy solid peribronchial opacity resulting in a "crazy-paving" appearance. Less pronounced middle lobe involvement, greater on the right. Main differential considerations include acute respiratory infection (such as mycoplasma, adenovirus, influenza, and PCP if immunocompromised), and noninfectious acute pulmonary inflammation such as due to vasculitis, lupus. 2. Associated small bilateral layering pleural effusions, increased from the recent abdomen CT and new since May. 3. Reactive appearing mediastinal lymphadenopathy. 4.  Calcified coronary artery and Aortic Atherosclerosis (ICD10-I70.0). Electronically Signed   By: Genevie Ann M.D.   On: 11/04/2017 04:50    Scheduled Meds: . aspirin EC  81 mg Oral Daily  . cholecalciferol  1,000 Units Oral Daily  . cycloSPORINE  1 drop Both Eyes BID  . enoxaparin (LOVENOX) injection  30 mg Subcutaneous Q24H  . gabapentin  300 mg Oral TID  . guaiFENesin  600 mg Oral BID  . hydroxychloroquine  200 mg Oral BID  . ipratropium-albuterol  3 mL Nebulization TID  . pantoprazole  40 mg Oral BID AC  . predniSONE  30 mg Oral Q breakfast  . sodium bicarbonate  650 mg Oral BID    Continuous Infusions: . cefTRIAXone (ROCEPHIN)  IV 1 g (11/02/17 1724)     Time spent: 69mins I have personally reviewed and interpreted on  11/04/2017 daily labs, tele strips, imagings as discussed above under date review session and assessment and plans.  I reviewed all nursing notes, pharmacy notes, consultant notes,  vitals, pertinent old records  I have discussed plan of care as described above with RN , patient and family on 11/04/2017   Florencia Reasons MD, PhD  [facility] Pager [phone]. If 7PM-7AM, please contact night-coverage at [email or web address], password [facility] 11/04/2017, 8:39 AM  LOS: 3 days

## 2017-11-05 ENCOUNTER — Encounter (HOSPITAL_COMMUNITY): Payer: Self-pay | Admitting: Family Medicine

## 2017-11-05 DIAGNOSIS — J449 Chronic obstructive pulmonary disease, unspecified: Secondary | ICD-10-CM

## 2017-11-05 DIAGNOSIS — J9601 Acute respiratory failure with hypoxia: Secondary | ICD-10-CM | POA: Diagnosis not present

## 2017-11-05 DIAGNOSIS — M329 Systemic lupus erythematosus, unspecified: Secondary | ICD-10-CM

## 2017-11-05 DIAGNOSIS — R918 Other nonspecific abnormal finding of lung field: Secondary | ICD-10-CM

## 2017-11-05 DIAGNOSIS — J189 Pneumonia, unspecified organism: Secondary | ICD-10-CM | POA: Diagnosis not present

## 2017-11-05 DIAGNOSIS — K589 Irritable bowel syndrome without diarrhea: Secondary | ICD-10-CM

## 2017-11-05 HISTORY — DX: Acute respiratory failure with hypoxia: J96.01

## 2017-11-05 LAB — BASIC METABOLIC PANEL
Anion gap: 8 (ref 5–15)
BUN: 24 mg/dL — AB (ref 8–23)
CALCIUM: 8.9 mg/dL (ref 8.9–10.3)
CHLORIDE: 102 mmol/L (ref 98–111)
CO2: 23 mmol/L (ref 22–32)
CREATININE: 1.36 mg/dL — AB (ref 0.44–1.00)
GFR, EST AFRICAN AMERICAN: 45 mL/min — AB (ref >60)
GFR, EST NON AFRICAN AMERICAN: 39 mL/min — AB (ref >60)
GLUCOSE: 108 mg/dL — AB (ref 70–99)
Potassium: 4.7 mmol/L (ref 3.5–5.1)
Sodium: 133 mmol/L — ABNORMAL LOW (ref 135–145)

## 2017-11-05 LAB — DIFFERENTIAL
Abs Immature Granulocytes: 0.2 10*3/uL — ABNORMAL HIGH (ref 0.0–0.1)
Basophils Absolute: 0 10*3/uL (ref 0.0–0.1)
Basophils Relative: 0 %
EOS ABS: 0 10*3/uL (ref 0.0–0.7)
EOS PCT: 0 %
Immature Granulocytes: 1 %
LYMPHS ABS: 0.9 10*3/uL (ref 0.7–4.0)
LYMPHS PCT: 5 %
MONO ABS: 1.9 10*3/uL — AB (ref 0.1–1.0)
Monocytes Relative: 10 %
Neutro Abs: 15.9 10*3/uL — ABNORMAL HIGH (ref 1.7–7.7)
Neutrophils Relative %: 84 %

## 2017-11-05 LAB — CBC
HEMATOCRIT: 29 % — AB (ref 36.0–46.0)
HEMOGLOBIN: 9.8 g/dL — AB (ref 12.0–15.0)
MCH: 29.3 pg (ref 26.0–34.0)
MCHC: 33.8 g/dL (ref 30.0–36.0)
MCV: 86.6 fL (ref 78.0–100.0)
Platelets: 225 10*3/uL (ref 150–400)
RBC: 3.35 MIL/uL — ABNORMAL LOW (ref 3.87–5.11)
RDW: 13.4 % (ref 11.5–15.5)
WBC: 19.2 10*3/uL — ABNORMAL HIGH (ref 4.0–10.5)

## 2017-11-05 LAB — PROCALCITONIN: Procalcitonin: 2.2 ng/mL

## 2017-11-05 LAB — EXPECTORATED SPUTUM ASSESSMENT W REFEX TO RESP CULTURE

## 2017-11-05 LAB — LEGIONELLA PNEUMOPHILA SEROGP 1 UR AG: L. pneumophila Serogp 1 Ur Ag: NEGATIVE

## 2017-11-05 MED ORDER — SODIUM CHLORIDE 0.9 % IV SOLN
INTRAVENOUS | Status: DC
Start: 2017-11-05 — End: 2017-11-06
  Administered 2017-11-05 – 2017-11-06 (×2): via INTRAVENOUS

## 2017-11-05 NOTE — Progress Notes (Signed)
Upon entering pt room at 0645, pt found to be anxious and c/o SOB. PRN breathing treatment and clonazepam given. Anxiety significantly decreased 5 min later and pt verbalized improvement. Shortly after pt back to baseline.  At shift change pt resting comfortably.

## 2017-11-05 NOTE — H&P (View-Only) (Signed)
Name: Sheila Morales MRN: [id number] DOB: [birth year]    ADMISSION DATE:  11/01/2017 CONSULTATION DATE:  11/05/17  REFERRING MD :  Dr. Bonner Puna / [facility]   CHIEF COMPLAINT:  Dyspnea, Hypoxia    HISTORY OF PRESENT ILLNESS:  67 y/o F, current smoker (52 yrs, 2ppd at heaviest, currently one ppd),  with SLE / IBS on Benlysta (recently started, 3-4 doses, once per month dosing for skin flare), Plaquenil, former smoking with severe emphysema/obstructive lung disease admitted 7/13 with reports of abd pain.   The patient had been seen as an outpatient for abdominal pain.  A CT was obtained and she was treated for concern for infection with antibiotics.  While on antibiotics, she had multiple episodes of diarrhea and no relief of abdominal pain.  Given no improvement, she called the office and stopped the antibiotics.  She was given a prednisone taper for concern for IBS flare.  On admit, she was on day 6/12 prednisone.   She was hoping for relief of abdominal pain and reported to the ER.  Prior to admit, she had two episodes of shaking chills.  She was found to have concern for dehydration and was admitted for evaluation.  During the hospitalization, she was noted to have complaints of shortness of breath & hypoxemia requiring O2.  U. Strep antigen was negative.  U. Legionella pending. RVP negative.  A CT of the chest was obtained which demonstrated severe emphysema, upper lobe predominant ground glass opacities, septal thickening.    PCCM consulted for evaluation.    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   has a past surgical history that includes lesion, vulva excision; Tonsillectomy; Cholecystectomy,  laparoscopic; Colonoscopy; Esophagogastroduodenoscopy; and Cholecystectomy.  Prior to Admission medications   Medication Sig Start Date End Date Taking? Authorizing Provider  albuterol (PROVENTIL HFA;VENTOLIN HFA) 108 (90 Base) MCG/ACT inhaler Inhale 2 puffs into the lungs every 6 (six) hours as needed for wheezing or shortness of breath. 01/23/17  Yes Collene Gobble, MD  AMBULATORY NON FORMULARY MEDICATION Medication Name: GI cocktail 5-10 mL every 4-6 hours as needed for pain 10/22/17  Yes Levin Erp, [place]  aspirin 81 MG tablet Take 81 mg by mouth daily.   Yes [provider]  Belimumab (BENLYSTA) 200 MG/ML SOAJ Inject 1 mL into the skin every 30 (thirty) days. Pt goes to Dr Lenna Gilford to get infusion. 10-11-17   Yes [provider]  cholecalciferol (VITAMIN D) 1000 UNITS tablet Take 1,000 Units by mouth daily.     Yes [provider]  gabapentin (NEURONTIN) 300 MG capsule Take 1 capsule (300 mg total) by mouth 3 (three) times daily. 2 po qam, 2 po afternoon, 3 po qhs Patient taking differently: Take 600-900 mg by mouth See admin instructions. 600 mg every morning & afternoon, 900 mg at bedtime 10/07/17  Yes Patel, Donika K, DO  glucosamine-chondroitin 500-400 MG tablet Take 2 tablets by mouth 2 (two) times daily.   Yes [provider]  guaiFENesin-dextromethorphan (ROBITUSSIN DM) 100-10 MG/5ML syrup Take 5 mLs by mouth every 4 (four) hours as needed for cough. 09/27/16  Yes Nche, Charlene Brooke, NP  hydroxychloroquine (PLAQUENIL) 200 MG tablet Take 200 mg by mouth 2 (two) times daily.  Yes [provider]  MULTIPLE VITAMIN PO Take 1 tablet by mouth daily.     Yes [provider]  Multiple Vitamins-Minerals (MULTIVITAMIN WITH MINERALS) tablet Take 1 tablet by mouth daily.   Yes [provider]  Omega-3 1000 MG CAPS Take 1,000 mg by mouth daily.   Yes [provider]  pantoprazole (PROTONIX) 40 MG tablet Take 1 tablet (40 mg  total) by mouth 2 (two) times daily before a meal. 10/22/17  Yes Lemmon, Lavone Nian, PA  pantoprazole (PROTONIX) 40 MG tablet Take 40 mg by mouth daily.   Yes [provider]  predniSONE (STERAPRED UNI-PAK 48 TAB) 5 MG (48) TBPK tablet Take 40 mg by mouth daily. Pt on dose pack. Started on 10-28-17. On day 3 of therapy 10/28/17  Yes [provider]  RESTASIS 0.05 % ophthalmic emulsion INSTILL 1 DROP INTO BOTH EYES TWICE A DAY 07/30/17  Yes [provider]  triamcinolone cream (KENALOG) 0.1 % APPLY TO AFFECTED AREA TWICE A DAY FOR 2 WEEK AS DIRECTED 09/08/17  Yes [provider]  vitamin C (ASCORBIC ACID) 500 MG tablet Take 500 mg by mouth daily.     Yes [provider]  vitamin E 1000 UNIT capsule Take 1,000 Units by mouth daily.   Yes [provider]  zolpidem (AMBIEN) 5 MG tablet TAKE 1 TABLET (5 MG TOTAL) BY MOUTH AT BEDTIME AS NEEDED. 07/15/17  Yes Hoyt Koch, MD  ANORO ELLIPTA 62.5-25 MCG/INH AEPB TAKE 1 PUFF BY MOUTH EVERY DAY Patient not taking: Reported on 11/01/2017 08/04/17   Collene Gobble, MD  colestipol (COLESTID) 5 G granules 1 scoop alternating with 1/2 scoop Patient not taking: Reported on 10/22/2017 01/24/15   Gatha Mayer, MD  metroNIDAZOLE (FLAGYL) 500 MG tablet Take 1 tablet (500 mg total) by mouth 3 (three) times daily. Patient not taking: Reported on 11/01/2017 09/18/17   Marrian Salvage, FNP  omeprazole (PRILOSEC) 40 MG capsule Take 1 capsule (40 mg total) by mouth daily before breakfast. Patient not taking: Reported on 11/01/2017 07/05/14   Gatha Mayer, MD    No Known Allergies  FAMILY HISTORY:  family history includes Atrial fibrillation in her father; Colon cancer in her father; Healthy in her daughter; Other in her sister; Stroke in her mother.  SOCIAL HISTORY:  reports that she has been smoking cigarettes.  She has a 15.00 pack-year smoking history. She has never used smokeless tobacco. She reports  that she drinks about 3.0 oz of alcohol per week. She reports that she does not use drugs.  REVIEW OF SYSTEMS:  POSITIVES IN BOLD Constitutional: Negative for fever, chills, weight loss, malaise/fatigue and diaphoresis.  HENT: Negative for hearing loss, ear pain, nosebleeds, congestion, sore throat, neck pain, tinnitus and ear discharge.   Eyes: Negative for blurred vision, double vision, photophobia, pain, discharge and redness.  Respiratory: Negative for cough, hemoptysis, sputum production, shortness of breath, wheezing and stridor.   Cardiovascular: Negative for chest pain, palpitations, orthopnea, claudication, leg swelling and PND.  Gastrointestinal: Negative for heartburn, nausea, vomiting, abdominal pain, diarrhea, constipation, blood in stool and melena.  Genitourinary: Negative for dysuria, urgency, frequency, hematuria and flank pain.  Musculoskeletal: Negative for myalgias, back pain, joint pain and falls.  Skin: Negative for itching and rash.  Neurological: Negative for dizziness, tingling, tremors, sensory change, speech change, focal weakness, seizures, loss of consciousness, weakness and headaches.  Endo/Heme/Allergies: Negative for environmental allergies and polydipsia. Does not bruise/bleed easily.  SUBJECTIVE:   VITAL SIGNS: Temp:  [97.9 F (36.6 C)-98.2 F (36.8 C)] 98.2 F (36.8 C) (07/17 0435) Pulse Rate:  [89-97] 89 (07/17 0435) Resp:  [18] 18 (07/17 0435) BP: (109-145)/(65-86) 145/86 (07/17 0435) SpO2:  [80 %-100 %] 100 % (07/17 0858)  PHYSICAL EXAMINATION: General:  Chronically ill appearing female lying in bed Neuro:  AAOx4, speech clear, MAE HEENT:  MM pink/moist, [place] O2, areas of hyperpigmentation on bilateral cheeks but no classic rash for lupus Cardiovascular:  s1s2 rrr, no m/r/g  Lungs:  Tachypnea, no distress, lungs bilaterally with bibasilar crackles  Abdomen:  Obese/soft, bsx4 active  Musculoskeletal:  No acute deformities  Skin:  Warm/dry,  erythematous rash diffusely  Recent Labs  Lab 11/03/17 0825 11/04/17 0319 11/05/17 0540  NA 130* 132* 133*  K 4.1 4.6 4.7  CL 100 104 102  CO2 18* 20* 23  BUN 30* 29* 24*  CREATININE 1.85* 1.64* 1.36*  GLUCOSE 108* 91 108*    Recent Labs  Lab 11/03/17 0825 11/04/17 0319 11/05/17 0540  HGB 11.5* 9.7* 9.8*  HCT 34.4* 28.6* 29.0*  WBC 28.8* 25.2* 19.2*  PLT 205 215 225    Ct Chest Wo Contrast  Result Date: 11/04/2017 CLINICAL DATA:  67 year old female with shortness of breath for several days. Abnormal chest x-ray. Hyponatremia. Acute renal insufficiency, metabolic acidosis. Recently on a steroid taper. History of lupus, recent cutaneous FLAIR. EXAM: CT CHEST WITHOUT CONTRAST TECHNIQUE: Multidetector CT imaging of the chest was performed following the standard protocol without IV contrast. COMPARISON:  Portable chest 11/03/2017. CT Abdomen and Pelvis 09/19/2017, and more recently 11/01/2017. FINDINGS: Cardiovascular: No cardiomegaly or pericardial effusion. Calcified coronary artery atherosclerosis (series 3, image 93). Vascular patency is not evaluated in the absence of IV contrast. Comparatively mild calcified thoracic and upper abdominal aortic atherosclerosis. Mediastinum/Nodes: Mild to moderate mediastinal lymphadenopathy with individual nodes up to 10 millimeter short axis, and suspected increased hilar nodes although suboptimally evaluated without IV contrast. Lungs/Pleura: New small layering pleural effusions since the CT in May, increased from the recent 11/01/2017 abdomen CT. The major airways are patent. New since May generalized bilateral pulmonary septal thickening with superimposed widespread bilateral upper ground-glass partially solid opacity, resulting in an intermittent crazy paving appearance. Mild middle lobe involvement, more so on the right. No solid pulmonary consolidation at this time. No cavitary changes. There is mild generalized peribronchial thickening. Upper  Abdomen: Stable upper abdomen since 11/01/2017. Musculoskeletal: No acute osseous abnormality identified. Mild pectus excavatum. Occasional mild upper thoracic superior endplate compression which appears chronic. IMPRESSION: 1. Upper lobe predominant generalized pulmonary septal thickening with superimposed ground-glass and patchy solid peribronchial opacity resulting in a "crazy-paving" appearance. Less pronounced middle lobe involvement, greater on the right. Main differential considerations include acute respiratory infection (such as mycoplasma, adenovirus, influenza, and PCP if immunocompromised), and noninfectious acute pulmonary inflammation such as due to vasculitis, lupus. 2. Associated small bilateral layering pleural effusions, increased from the recent abdomen CT and new since May. 3. Reactive appearing mediastinal lymphadenopathy. 4. Calcified coronary artery and Aortic Atherosclerosis (ICD10-I70.0). Electronically Signed   By: Genevie Ann M.D.   On: 11/04/2017 04:50      SIGNIFICANT EVENTS  7/13  Admit  STUDIES CT Chest 7/15 >> interstitial thickening throughout the lungs bilaterally, slightly more in the RUL, GGO, no consolidation or edema, no adenopathy  CULTURES UC 7/15 >> negative  BCx2 7/15 >>  Sputum 7/15 >>  RVP 7/16 >> negative  U. Strep 7/16 >>  U.  [place] 7/16 >>   ANTIBIOTICS  Vanco 7/16 >>  Zosyn 7/16 >>    ASSESSMENT / PLAN:  Discussion:  67 y/o F with SLE / IBS on Benlysta, Plaquenil, former smoking with severe emphysema/obstructive lung disease admitted 7/13 with reports of abd pain.  Developed acute hypoxic respiratory failure > CT with bilateral GGO (R>L, upper lobe predominant), interseptal thickening.  DDx includes opportunistic infection with immune suppression, RBILD, CAP & pulmonary autoimmune manifesation of lupus.   Acute Hypoxic Respiratory Failure  Severe Obstructive Lung Disease / COPD SLE / IBS - on Benlysta, Plaquenil, Colestid   Plan: NPO  7/18 for planned bronchoscopy  Follow intermittent CXR  O2 as needed to support sats > 90-95% Continue empiric abx for now Continue baseline prednisone, may need higher dose if suspected lupus flare Will send differential for peripheral eosinophils   Attending to follow.   Noe Gens, NP-C [facility] Pgr: [phone] or if no answer [phone] 11/05/2017, 12:17 PM

## 2017-11-05 NOTE — Progress Notes (Signed)
PROGRESS NOTE  Sheila Morales  LPF:[id number] DOB: August 13, 1950 DOA: 11/01/2017 PCP: Hoyt Koch, MD   Brief Narrative: Sheila Morales is a 67 y.o. female with a history of tobacco use, COPD, SLE, GERD, IBS, and rheumatic fever who presented to the ED for 1 week of abdominal pain, poor per oral intake worse than her chronic abdominal pain starting after taking empiric ciprofloxacin and flagyl 5/30 as an outpatient for possible diverticulitis despite no inflammation on CT abd/pelvis 5/31. Also had diarrhea after this, and abx stopped 6/3. She also had fevers to 101F at home and continued to have abdominal pain. CT abdomen demonstrated right perinephric stranding and ceftriaxone was started, though urine culture was negative and this was stopped. She developed progressive dyspnea 7/15-7/16, CT chest performed showed bilateral, predominantly upper lobe opacities with broad differential diagnosis. Empiric HCAP coverage was started, RVP negative. Pulmonology consulted 7/17, planning BAL 7/18.   Assessment & Plan: Active Problems:   Irritable bowel syndrome   Systemic lupus erythematosus (HCC)   COPD (chronic obstructive pulmonary disease) (HCC)   Hyponatremia   AKI (acute kidney injury) ([place])   Pyelonephritis   Dehydration   Acute respiratory failure with hypoxia (HCC)   HCAP (healthcare-associated pneumonia)  Acute hypoxic respiratory failure on COPD: With abnormal CT chest, upper lobe predominance.  - Appreciate pulmonology assistance. BAL 7/18.  - Continuing empiric abx for HCAP. Trending PCT improving. Sputum Cx pending. Blood cultures NGTD. - Continue steroids and plaquenil as below. ANA, anti-dsDNA, complement sent to investigate autoimmune activity, diff sent for Eo's (neg).  - Continue bronchodilators TID.  - Continue low dose clonazepam as there is some underlying anxiety contributing to dyspnea "attacks."   SLE with dermatologic manifestations: on belimumab as outpatient. Recently  given steroid burst with improvement in rash.  - Continue prednisone with PPI - Continue plaquenil.   AKI: SCr. baseline 0.7, up to 2.3 on admission, prerenal most likely given history and with improvement with IV fluids.  - Continue IVF's, push po - Monitor BMP daily  Abdominal pain, poor per oral intake on chronic IBS: No bowel obstruction or inflammation on CT abd/pelvis at admission. - Symptomatic management.    Possible acute pyelonephritis: R>L perinephric stranding without obstructive uropathy on CT. - Continue vancomycin, zosyn as above - Urine culture negative - Doubt leukocytosis is reliable in setting of steroids  Peripheral neuropathy:  - Continue gabapentin, renally dosing 336m TID. Home dosing is TID 6063m/ 60025m 900m2m DVT prophylaxis: Lovenox Code Status: Full Family Communication: Husband at bedside Disposition Plan: Discussed with patient that she would not be able to attend her annual vacation to [place] weekend.  Consultants:   Pulmonology  Procedures:   BAL planned 7/18.   Antimicrobials:  Ceftriaxone  7/13 - 7/14  Vancomycin, zosyn 7/16 >>   Subjective: Still feels lousy. Coughing nonproductive, dyspnea, no chest pain. No orthopnea. Eating very little per husband. No fevers. Wants to go home.   Objective: Vitals:   11/05/17 0435 11/05/17 0858 11/05/17 1304 11/05/17 1508  BP: (!) 145/86   113/69  Pulse: 89   (!) 107  Resp: 18   20  Temp: 98.2 F (36.8 C)   98.5 F (36.9 C)  TempSrc: Oral   Oral  SpO2: 95% 100% 93% 96%  Weight:      Height:        Intake/Output Summary (Last 24 hours) at 11/05/2017 1842 Last data filed at 11/05/2017 1100 Gross per 24  hour  Intake 393.31 ml  Output 800 ml  Net -406.69 ml   Filed Weights   11/01/17 1757  Weight: 60.5 kg (133 lb 6.1 oz)    Gen: Ill-appearing 67 y.o. female Pulm: Mildly labored tachypnea, expiratory prolongation with supplemental oxygen. Crackles.  CV: Regular rate and  rhythm. No murmur, rub, or gallop. No JVD, no pedal edema. GI: Abdomen soft, non-tender, non-distended, with normoactive bowel sounds. No organomegaly or masses felt. Ext: Warm, no deformities Skin: Erythematous papular eruption across chest with confluence toward neck anteriorly, some involvement on left arm, improving per pt report. No ulcers.  Neuro: Alert and oriented. No focal neurological deficits. Psych: Judgement and insight appear normal. Mood & affect appropriate.   Data Reviewed: I have personally reviewed following labs and imaging studies  CBC: Recent Labs  Lab 11/01/17 0835 11/01/17 1514 11/02/17 0451 11/03/17 0825 11/04/17 0319 11/05/17 0540  WBC 18.9*  --  26.0* 28.8* 25.2* 19.2*  NEUTROABS  --   --   --   --   --  15.9*  HGB 12.2 11.6* 9.9* 11.5* 9.7* 9.8*  HCT 37.1 34.0* 30.1* 34.4* 28.6* 29.0*  MCV 88.1  --  90.1 87.8 86.7 86.6  PLT 210  --  177 205 215 893   Basic Metabolic Panel: Recent Labs  Lab 11/01/17 0835 11/01/17 1514 11/02/17 0451 11/03/17 0825 11/04/17 0319 11/05/17 0540  NA 126* 125* 129* 130* 132* 133*  K 5.0 4.8 4.1 4.1 4.6 4.7  CL 92* 95* 102 100 104 102  CO2 24  --  20* 18* 20* 23  GLUCOSE 100* 67* 58* 108* 91 108*  BUN 23 27* 28* 30* 29* 24*  CREATININE 1.84* 2.30* 1.98* 1.85* 1.64* 1.36*  CALCIUM 9.1  --  6.9* 8.7* 8.6* 8.9   GFR: Estimated Creatinine Clearance: 33.9 mL/min (A) (by C-G formula based on SCr of 1.36 mg/dL (H)). Liver Function Tests: Recent Labs  Lab 11/01/17 0835  AST 30  ALT 28  ALKPHOS 80  BILITOT 0.9  PROT 7.1  ALBUMIN 3.2*   Recent Labs  Lab 11/01/17 0835  LIPASE 27   No results for input(s): AMMONIA in the last 168 hours. Coagulation Profile: No results for input(s): INR, PROTIME in the last 168 hours. Cardiac Enzymes: No results for input(s): CKTOTAL, CKMB, CKMBINDEX, TROPONINI in the last 168 hours. BNP (last 3 results) No results for input(s): PROBNP in the last 8760 hours. HbA1C: No  results for input(s): HGBA1C in the last 72 hours. CBG: Recent Labs  Lab 11/03/17 0840  GLUCAP 99   Lipid Profile: No results for input(s): CHOL, HDL, LDLCALC, TRIG, CHOLHDL, LDLDIRECT in the last 72 hours. Thyroid Function Tests: No results for input(s): TSH, T4TOTAL, FREET4, T3FREE, THYROIDAB in the last 72 hours. Anemia Panel: No results for input(s): VITAMINB12, FOLATE, FERRITIN, TIBC, IRON, RETICCTPCT in the last 72 hours. Urine analysis:    Component Value Date/Time   COLORURINE AMBER (A) 11/01/2017 1445   APPEARANCEUR CLOUDY (A) 11/01/2017 1445   LABSPEC 1.011 11/01/2017 1445   PHURINE 6.0 11/01/2017 1445   GLUCOSEU NEGATIVE 11/01/2017 1445   HGBUR NEGATIVE 11/01/2017 1445   BILIRUBINUR NEGATIVE 11/01/2017 1445   KETONESUR NEGATIVE 11/01/2017 1445   PROTEINUR 100 (A) 11/01/2017 1445   NITRITE NEGATIVE 11/01/2017 1445   LEUKOCYTESUR LARGE (A) 11/01/2017 1445   Recent Results (from the past 240 hour(s))  Culture, blood (routine x 2)     Status: None (Preliminary result)   Collection Time: 11/03/17  8:25 AM  Result Value Ref Range Status   Specimen Description BLOOD RIGHT ANTECUBITAL  Final   Special Requests   Final    BOTTLES DRAWN AEROBIC ONLY Blood Culture adequate volume   Culture   Final    NO GROWTH 2 DAYS Performed at [facility], [address], [place], [place] [address]    Report Status PENDING  Incomplete  Culture, blood (routine x 2)     Status: None (Preliminary result)   Collection Time: 11/03/17  8:25 AM  Result Value Ref Range Status   Specimen Description BLOOD LEFT ANTECUBITAL  Final   Special Requests   Final    BOTTLES DRAWN AEROBIC ONLY Blood Culture results may not be optimal due to an inadequate volume of blood received in culture bottles   Culture   Final    NO GROWTH 2 DAYS Performed at [facility], [place] [address], [place], [place] [address]    Report Status PENDING  Incomplete  Urine culture     Status: None   Collection  Time: 11/03/17  5:00 PM  Result Value Ref Range Status   Specimen Description URINE, RANDOM  Final   Special Requests NONE  Final   Culture   Final    NO GROWTH Performed at [facility], [place] [address], [place], [place] [address]    Report Status 11/04/2017 FINAL  Final  MRSA PCR Screening     Status: None   Collection Time: 11/03/17  5:32 PM  Result Value Ref Range Status   MRSA by PCR NEGATIVE NEGATIVE Final    Comment:        The GeneXpert MRSA Assay (FDA approved for NASAL specimens only), is one component of a comprehensive MRSA colonization surveillance program. It is not intended to diagnose MRSA infection nor to guide or monitor treatment for MRSA infections. Performed at [facility], [place] [address] [place], [place] [address]   Culture, expectorated sputum-assessment     Status: None   Collection Time: 11/03/17  5:33 PM  Result Value Ref Range Status   Specimen Description SPUTUM  Final   Special Requests NONE  Final   Sputum evaluation   Final    THIS SPECIMEN IS ACCEPTABLE FOR SPUTUM CULTURE Performed at [facility], [address] [place], [place] [address]    Report Status 11/05/2017 FINAL  Final  Culture, respiratory (NON-Expectorated)     Status: None (Preliminary result)   Collection Time: 11/03/17  5:33 PM  Result Value Ref Range Status   Specimen Description SPUTUM  Final   Special Requests NONE Reflexed from [id number]  Final   Gram Stain   Final    NO WBC SEEN RARE SQUAMOUS EPITHELIAL CELLS PRESENT FEW GRAM POSITIVE COCCI Performed at [facility], [place] [address], [place], [place] [address]    Culture PENDING  Incomplete   Report Status PENDING  Incomplete  Respiratory Panel by PCR     Status: None   Collection Time: 11/04/17  8:38 AM  Result Value Ref Range Status   Adenovirus NOT DETECTED NOT DETECTED Final   Coronavirus 229E NOT DETECTED NOT DETECTED Final   Coronavirus HKU1 NOT DETECTED NOT DETECTED Final    Coronavirus NL63 NOT DETECTED NOT DETECTED Final   Coronavirus OC43 NOT DETECTED NOT DETECTED Final   Metapneumovirus NOT DETECTED NOT DETECTED Final   Rhinovirus / Enterovirus NOT DETECTED NOT DETECTED Final   Influenza A NOT  DETECTED NOT DETECTED Final   Influenza B NOT DETECTED NOT DETECTED Final   Parainfluenza Virus 1 NOT DETECTED NOT DETECTED Final   Parainfluenza Virus 2 NOT DETECTED NOT DETECTED Final   Parainfluenza Virus 3 NOT DETECTED NOT DETECTED Final   Parainfluenza Virus 4 NOT DETECTED NOT DETECTED Final   Respiratory Syncytial Virus NOT DETECTED NOT DETECTED Final   Bordetella pertussis NOT DETECTED NOT DETECTED Final   Chlamydophila pneumoniae NOT DETECTED NOT DETECTED Final   Mycoplasma pneumoniae NOT DETECTED NOT DETECTED Final    Comment: Performed at [facility], [place] [address], [place], [place] [address]      Radiology Studies: Ct Chest Wo Contrast  Result Date: 11/04/2017 CLINICAL DATA:  67 year old female with shortness of breath for several days. Abnormal chest x-ray. Hyponatremia. Acute renal insufficiency, metabolic acidosis. Recently on a steroid taper. History of lupus, recent cutaneous FLAIR. EXAM: CT CHEST WITHOUT CONTRAST TECHNIQUE: Multidetector CT imaging of the chest was performed following the standard protocol without IV contrast. COMPARISON:  Portable chest 11/03/2017. CT Abdomen and Pelvis 09/19/2017, and more recently 11/01/2017. FINDINGS: Cardiovascular: No cardiomegaly or pericardial effusion. Calcified coronary artery atherosclerosis (series 3, image 93). Vascular patency is not evaluated in the absence of IV contrast. Comparatively mild calcified thoracic and upper abdominal aortic atherosclerosis. Mediastinum/Nodes: Mild to moderate mediastinal lymphadenopathy with individual nodes up to 10 millimeter short axis, and suspected increased hilar nodes although suboptimally evaluated without IV contrast. Lungs/Pleura: New small layering pleural  effusions since the CT in May, increased from the recent 11/01/2017 abdomen CT. The major airways are patent. New since May generalized bilateral pulmonary septal thickening with superimposed widespread bilateral upper ground-glass partially solid opacity, resulting in an intermittent crazy paving appearance. Mild middle lobe involvement, more so on the right. No solid pulmonary consolidation at this time. No cavitary changes. There is mild generalized peribronchial thickening. Upper Abdomen: Stable upper abdomen since 11/01/2017. Musculoskeletal: No acute osseous abnormality identified. Mild pectus excavatum. Occasional mild upper thoracic superior endplate compression which appears chronic. IMPRESSION: 1. Upper lobe predominant generalized pulmonary septal thickening with superimposed ground-glass and patchy solid peribronchial opacity resulting in a "crazy-paving" appearance. Less pronounced middle lobe involvement, greater on the right. Main differential considerations include acute respiratory infection (such as mycoplasma, adenovirus, influenza, and PCP if immunocompromised), and noninfectious acute pulmonary inflammation such as due to vasculitis, lupus. 2. Associated small bilateral layering pleural effusions, increased from the recent abdomen CT and new since May. 3. Reactive appearing mediastinal lymphadenopathy. 4. Calcified coronary artery and Aortic Atherosclerosis (ICD10-I70.0). Electronically Signed   By: Genevie Ann M.D.   On: 11/04/2017 04:50    Scheduled Meds: . aspirin EC  81 mg Oral Daily  . cholecalciferol  1,000 Units Oral Daily  . cycloSPORINE  1 drop Both Eyes BID  . enoxaparin (LOVENOX) injection  30 mg Subcutaneous Q24H  . gabapentin  300 mg Oral TID  . guaiFENesin  600 mg Oral BID  . hydroxychloroquine  200 mg Oral BID  . ipratropium-albuterol  3 mL Nebulization TID  . pantoprazole  40 mg Oral BID AC  . predniSONE  20 mg Oral Q breakfast  . sodium bicarbonate  650 mg Oral BID    Continuous Infusions: . sodium chloride 10 mL/hr at 11/05/17 1100  . sodium chloride Stopped (11/05/17 6948)  . sodium chloride    . piperacillin-tazobactam (ZOSYN)  IV 3.375 g (11/05/17 1345)  . vancomycin 500 mg (11/05/17 1137)     LOS: 4 days  Time spent: 35 minutes.  Patrecia Pour, MD [facility] [email or web address] Password [facility] 11/05/2017, 6:42 PM

## 2017-11-05 NOTE — Consult Note (Addendum)
Name: Sheila Morales MRN: [id number] DOB: [birth year]    ADMISSION DATE:  11/01/2017 CONSULTATION DATE:  11/05/17  REFERRING MD :  Dr. Bonner Puna / [facility]   CHIEF COMPLAINT:  Dyspnea, Hypoxia    HISTORY OF PRESENT ILLNESS:  67 y/o F, current smoker (52 yrs, 2ppd at heaviest, currently one ppd),  with SLE / IBS on Benlysta (recently started, 3-4 doses, once per month dosing for skin flare), Plaquenil, former smoking with severe emphysema/obstructive lung disease admitted 7/13 with reports of abd pain.   The patient had been seen as an outpatient for abdominal pain.  A CT was obtained and she was treated for concern for infection with antibiotics.  While on antibiotics, she had multiple episodes of diarrhea and no relief of abdominal pain.  Given no improvement, she called the office and stopped the antibiotics.  She was given a prednisone taper for concern for IBS flare.  On admit, she was on day 6/12 prednisone.   She was hoping for relief of abdominal pain and reported to the ER.  Prior to admit, she had two episodes of shaking chills.  She was found to have concern for dehydration and was admitted for evaluation.  During the hospitalization, she was noted to have complaints of shortness of breath & hypoxemia requiring O2.  U. Strep antigen was negative.  U. Legionella pending. RVP negative.  A CT of the chest was obtained which demonstrated severe emphysema, upper lobe predominant ground glass opacities, septal thickening.    PCCM consulted for evaluation.    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   has a past surgical history that includes lesion, vulva excision; Tonsillectomy; Cholecystectomy,  laparoscopic; Colonoscopy; Esophagogastroduodenoscopy; and Cholecystectomy.  Prior to Admission medications   Medication Sig Start Date End Date Taking? Authorizing Provider  albuterol (PROVENTIL HFA;VENTOLIN HFA) 108 (90 Base) MCG/ACT inhaler Inhale 2 puffs into the lungs every 6 (six) hours as needed for wheezing or shortness of breath. 01/23/17  Yes Collene Gobble, MD  AMBULATORY NON FORMULARY MEDICATION Medication Name: GI cocktail 5-10 mL every 4-6 hours as needed for pain 10/22/17  Yes Levin Erp, [place]  aspirin 81 MG tablet Take 81 mg by mouth daily.   Yes [provider]  Belimumab (BENLYSTA) 200 MG/ML SOAJ Inject 1 mL into the skin every 30 (thirty) days. Pt goes to Dr Lenna Gilford to get infusion. 10-11-17   Yes [provider]  cholecalciferol (VITAMIN D) 1000 UNITS tablet Take 1,000 Units by mouth daily.     Yes [provider]  gabapentin (NEURONTIN) 300 MG capsule Take 1 capsule (300 mg total) by mouth 3 (three) times daily. 2 po qam, 2 po afternoon, 3 po qhs Patient taking differently: Take 600-900 mg by mouth See admin instructions. 600 mg every morning & afternoon, 900 mg at bedtime 10/07/17  Yes Patel, Donika K, DO  glucosamine-chondroitin 500-400 MG tablet Take 2 tablets by mouth 2 (two) times daily.   Yes [provider]  guaiFENesin-dextromethorphan (ROBITUSSIN DM) 100-10 MG/5ML syrup Take 5 mLs by mouth every 4 (four) hours as needed for cough. 09/27/16  Yes Nche, Charlene Brooke, NP  hydroxychloroquine (PLAQUENIL) 200 MG tablet Take 200 mg by mouth 2 (two) times daily.  Yes [provider]  MULTIPLE VITAMIN PO Take 1 tablet by mouth daily.     Yes [provider]  Multiple Vitamins-Minerals (MULTIVITAMIN WITH MINERALS) tablet Take 1 tablet by mouth daily.   Yes [provider]  Omega-3 1000 MG CAPS Take 1,000 mg by mouth daily.   Yes [provider]  pantoprazole (PROTONIX) 40 MG tablet Take 1 tablet (40 mg  total) by mouth 2 (two) times daily before a meal. 10/22/17  Yes Lemmon, Lavone Nian, PA  pantoprazole (PROTONIX) 40 MG tablet Take 40 mg by mouth daily.   Yes [provider]  predniSONE (STERAPRED UNI-PAK 48 TAB) 5 MG (48) TBPK tablet Take 40 mg by mouth daily. Pt on dose pack. Started on 10-28-17. On day 3 of therapy 10/28/17  Yes [provider]  RESTASIS 0.05 % ophthalmic emulsion INSTILL 1 DROP INTO BOTH EYES TWICE A DAY 07/30/17  Yes [provider]  triamcinolone cream (KENALOG) 0.1 % APPLY TO AFFECTED AREA TWICE A DAY FOR 2 WEEK AS DIRECTED 09/08/17  Yes [provider]  vitamin C (ASCORBIC ACID) 500 MG tablet Take 500 mg by mouth daily.     Yes [provider]  vitamin E 1000 UNIT capsule Take 1,000 Units by mouth daily.   Yes [provider]  zolpidem (AMBIEN) 5 MG tablet TAKE 1 TABLET (5 MG TOTAL) BY MOUTH AT BEDTIME AS NEEDED. 07/15/17  Yes Hoyt Koch, MD  ANORO ELLIPTA 62.5-25 MCG/INH AEPB TAKE 1 PUFF BY MOUTH EVERY DAY Patient not taking: Reported on 11/01/2017 08/04/17   Collene Gobble, MD  colestipol (COLESTID) 5 G granules 1 scoop alternating with 1/2 scoop Patient not taking: Reported on 10/22/2017 01/24/15   Gatha Mayer, MD  metroNIDAZOLE (FLAGYL) 500 MG tablet Take 1 tablet (500 mg total) by mouth 3 (three) times daily. Patient not taking: Reported on 11/01/2017 09/18/17   Marrian Salvage, FNP  omeprazole (PRILOSEC) 40 MG capsule Take 1 capsule (40 mg total) by mouth daily before breakfast. Patient not taking: Reported on 11/01/2017 07/05/14   Gatha Mayer, MD    No Known Allergies  FAMILY HISTORY:  family history includes Atrial fibrillation in her father; Colon cancer in her father; Healthy in her daughter; Other in her sister; Stroke in her mother.  SOCIAL HISTORY:  reports that she has been smoking cigarettes.  She has a 15.00 pack-year smoking history. She has never used smokeless tobacco. She reports  that she drinks about 3.0 oz of alcohol per week. She reports that she does not use drugs.  REVIEW OF SYSTEMS:  POSITIVES IN BOLD Constitutional: Negative for fever, chills, weight loss, malaise/fatigue and diaphoresis.  HENT: Negative for hearing loss, ear pain, nosebleeds, congestion, sore throat, neck pain, tinnitus and ear discharge.   Eyes: Negative for blurred vision, double vision, photophobia, pain, discharge and redness.  Respiratory: Negative for cough, hemoptysis, sputum production, shortness of breath, wheezing and stridor.   Cardiovascular: Negative for chest pain, palpitations, orthopnea, claudication, leg swelling and PND.  Gastrointestinal: Negative for heartburn, nausea, vomiting, abdominal pain, diarrhea, constipation, blood in stool and melena.  Genitourinary: Negative for dysuria, urgency, frequency, hematuria and flank pain.  Musculoskeletal: Negative for myalgias, back pain, joint pain and falls.  Skin: Negative for itching and rash.  Neurological: Negative for dizziness, tingling, tremors, sensory change, speech change, focal weakness, seizures, loss of consciousness, weakness and headaches.  Endo/Heme/Allergies: Negative for environmental allergies and polydipsia. Does not bruise/bleed easily.  SUBJECTIVE:   VITAL SIGNS: Temp:  [97.9 F (36.6 C)-98.2 F (36.8 C)] 98.2 F (36.8 C) (07/17 0435) Pulse Rate:  [89-97] 89 (07/17 0435) Resp:  [18] 18 (07/17 0435) BP: (109-145)/(65-86) 145/86 (07/17 0435) SpO2:  [80 %-100 %] 100 % (07/17 0858)  PHYSICAL EXAMINATION: General:  Chronically ill appearing female lying in bed Neuro:  AAOx4, speech clear, MAE HEENT:  MM pink/moist, [place] O2, areas of hyperpigmentation on bilateral cheeks but no classic rash for lupus Cardiovascular:  s1s2 rrr, no m/r/g  Lungs:  Tachypnea, no distress, lungs bilaterally with bibasilar crackles  Abdomen:  Obese/soft, bsx4 active  Musculoskeletal:  No acute deformities  Skin:  Warm/dry,  erythematous rash diffusely  Recent Labs  Lab 11/03/17 0825 11/04/17 0319 11/05/17 0540  NA 130* 132* 133*  K 4.1 4.6 4.7  CL 100 104 102  CO2 18* 20* 23  BUN 30* 29* 24*  CREATININE 1.85* 1.64* 1.36*  GLUCOSE 108* 91 108*    Recent Labs  Lab 11/03/17 0825 11/04/17 0319 11/05/17 0540  HGB 11.5* 9.7* 9.8*  HCT 34.4* 28.6* 29.0*  WBC 28.8* 25.2* 19.2*  PLT 205 215 225    Ct Chest Wo Contrast  Result Date: 11/04/2017 CLINICAL DATA:  67 year old female with shortness of breath for several days. Abnormal chest x-ray. Hyponatremia. Acute renal insufficiency, metabolic acidosis. Recently on a steroid taper. History of lupus, recent cutaneous FLAIR. EXAM: CT CHEST WITHOUT CONTRAST TECHNIQUE: Multidetector CT imaging of the chest was performed following the standard protocol without IV contrast. COMPARISON:  Portable chest 11/03/2017. CT Abdomen and Pelvis 09/19/2017, and more recently 11/01/2017. FINDINGS: Cardiovascular: No cardiomegaly or pericardial effusion. Calcified coronary artery atherosclerosis (series 3, image 93). Vascular patency is not evaluated in the absence of IV contrast. Comparatively mild calcified thoracic and upper abdominal aortic atherosclerosis. Mediastinum/Nodes: Mild to moderate mediastinal lymphadenopathy with individual nodes up to 10 millimeter short axis, and suspected increased hilar nodes although suboptimally evaluated without IV contrast. Lungs/Pleura: New small layering pleural effusions since the CT in May, increased from the recent 11/01/2017 abdomen CT. The major airways are patent. New since May generalized bilateral pulmonary septal thickening with superimposed widespread bilateral upper ground-glass partially solid opacity, resulting in an intermittent crazy paving appearance. Mild middle lobe involvement, more so on the right. No solid pulmonary consolidation at this time. No cavitary changes. There is mild generalized peribronchial thickening. Upper  Abdomen: Stable upper abdomen since 11/01/2017. Musculoskeletal: No acute osseous abnormality identified. Mild pectus excavatum. Occasional mild upper thoracic superior endplate compression which appears chronic. IMPRESSION: 1. Upper lobe predominant generalized pulmonary septal thickening with superimposed ground-glass and patchy solid peribronchial opacity resulting in a "crazy-paving" appearance. Less pronounced middle lobe involvement, greater on the right. Main differential considerations include acute respiratory infection (such as mycoplasma, adenovirus, influenza, and PCP if immunocompromised), and noninfectious acute pulmonary inflammation such as due to vasculitis, lupus. 2. Associated small bilateral layering pleural effusions, increased from the recent abdomen CT and new since May. 3. Reactive appearing mediastinal lymphadenopathy. 4. Calcified coronary artery and Aortic Atherosclerosis (ICD10-I70.0). Electronically Signed   By: Genevie Ann M.D.   On: 11/04/2017 04:50      SIGNIFICANT EVENTS  7/13  Admit  STUDIES CT Chest 7/15 >> interstitial thickening throughout the lungs bilaterally, slightly more in the RUL, GGO, no consolidation or edema, no adenopathy  CULTURES UC 7/15 >> negative  BCx2 7/15 >>  Sputum 7/15 >>  RVP 7/16 >> negative  U. Strep 7/16 >>  U.  Leg[place]alla 7/16 >>   ANTIBIOTICS  Vanco 7/16 >>  Zosyn 7/16 >>    ASSESSMENT / PLAN:  Discussion:  67 y/o F with SLE / IBS on Benlysta, Plaquenil, former smoking with severe emphysema/obstructive lung disease admitted 7/13 with reports of abd pain.  Developed acute hypoxic respiratory failure > CT with bilateral GGO (R>L, upper lobe predominant), interseptal thickening.  DDx includes opportunistic infection with immune suppression, RBILD, CAP & pulmonary autoimmune manifesation of lupus.   Acute Hypoxic Respiratory Failure  Severe Obstructive Lung Disease / COPD SLE / IBS - on Benlysta, Plaquenil, Colestid   Plan: NPO  7/18 for planned bronchoscopy  Follow intermittent CXR  O2 as needed to support sats > 90-95% Continue empiric abx for now Continue baseline prednisone, may need higher dose if suspected lupus flare Will send differential for peripheral eosinophils   Attending to follow.   Noe Gens, NP-C [facility] Pgr: [phone] or if no answer [phone] 11/05/2017, 12:17 PM

## 2017-11-05 NOTE — Progress Notes (Signed)
PT Cancellation Note  Patient Details Name: Sheila Morales MRN: [id number] DOB: [birth year]   Cancelled Treatment:    Reason Eval/Treat Not Completed: Patient declined, no reason specified;Fatigue/lethargy limiting ability to participate. RN states pt had an episode of shortness of breath prior this morning.  Pt states she does not feel up to any out of bed activity today.  PT will attempt treatment as appropriate.   Sukari Grist 11/05/2017, 10:19 AM

## 2017-11-06 ENCOUNTER — Inpatient Hospital Stay: Payer: Medicare Other | Admitting: Internal Medicine

## 2017-11-06 ENCOUNTER — Inpatient Hospital Stay (HOSPITAL_COMMUNITY): Payer: Medicare Other

## 2017-11-06 ENCOUNTER — Encounter (HOSPITAL_COMMUNITY): Payer: Medicare Other

## 2017-11-06 ENCOUNTER — Encounter (HOSPITAL_COMMUNITY)
Admission: EM | Disposition: A | Discharge status: Home / Self Care | Payer: Self-pay | Source: Home / Self Care | Attending: Family Medicine

## 2017-11-06 DIAGNOSIS — R05 Cough: Secondary | ICD-10-CM

## 2017-11-06 DIAGNOSIS — J189 Pneumonia, unspecified organism: Secondary | ICD-10-CM

## 2017-11-06 HISTORY — PX: VIDEO BRONCHOSCOPY: SHX5072

## 2017-11-06 LAB — BODY FLUID CELL COUNT WITH DIFFERENTIAL
EOS FL: 1 %
LYMPHS FL: 14 %
Monocyte-Macrophage-Serous Fluid: 4 % — ABNORMAL LOW (ref 50–90)
NEUTROPHIL FLUID: 81 % — AB (ref 0–25)
WBC FLUID: 213 uL (ref 0–1000)

## 2017-11-06 LAB — CBC
HCT: 30.2 % — ABNORMAL LOW (ref 36.0–46.0)
Hemoglobin: 10.2 g/dL — ABNORMAL LOW (ref 12.0–15.0)
MCH: 29.1 pg (ref 26.0–34.0)
MCHC: 33.8 g/dL (ref 30.0–36.0)
MCV: 86.3 fL (ref 78.0–100.0)
Platelets: 272 10*3/uL (ref 150–400)
RBC: 3.5 MIL/uL — AB (ref 3.87–5.11)
RDW: 13.5 % (ref 11.5–15.5)
WBC: 16.7 10*3/uL — AB (ref 4.0–10.5)

## 2017-11-06 LAB — BASIC METABOLIC PANEL
ANION GAP: 8 (ref 5–15)
BUN: 19 mg/dL (ref 8–23)
CALCIUM: 8.7 mg/dL — AB (ref 8.9–10.3)
CO2: 23 mmol/L (ref 22–32)
CREATININE: 1.15 mg/dL — AB (ref 0.44–1.00)
Chloride: 105 mmol/L (ref 98–111)
GFR, EST AFRICAN AMERICAN: 55 mL/min — AB (ref >60)
GFR, EST NON AFRICAN AMERICAN: 48 mL/min — AB (ref >60)
Glucose, Bld: 78 mg/dL (ref 70–99)
Potassium: 4 mmol/L (ref 3.5–5.1)
SODIUM: 136 mmol/L (ref 135–145)

## 2017-11-06 LAB — BLOOD GAS, ARTERIAL
ACID-BASE EXCESS: 1.8 mmol/L (ref 0.0–2.0)
Bicarbonate: 24.7 mmol/L (ref 20.0–28.0)
Drawn by: 24513
O2 Content: 2 L/min
O2 Saturation: 98.3 %
PH ART: 7.508 — AB (ref 7.350–7.450)
PO2 ART: 106 mmHg (ref 83.0–108.0)
Patient temperature: 99.2
pCO2 arterial: 31.4 mmHg — ABNORMAL LOW (ref 32.0–48.0)

## 2017-11-06 LAB — ENA+DNA/DS+ANTICH+CENTRO+JO...
Anti JO-1: <0.2 AI (ref 0.0–0.9)
CHROMATIN AB SERPL-ACNC: 0.2 AI (ref 0.0–0.9)
ENA SM Ab Ser-aCnc: <0.2 AI (ref 0.0–0.9)
Ribonucleic Protein: 1.3 AI — ABNORMAL HIGH (ref 0.0–0.9)
SSA (Ro) (ENA) Antibody, IgG: >8 AI — ABNORMAL HIGH (ref 0.0–0.9)
SSB (La) (ENA) Antibody, IgG: 7 AI — ABNORMAL HIGH (ref 0.0–0.9)

## 2017-11-06 LAB — PROCALCITONIN: PROCALCITONIN: 1.1 ng/mL

## 2017-11-06 LAB — ANA W/REFLEX IF POSITIVE: Anti Nuclear Antibody(ANA): POSITIVE — AB

## 2017-11-06 LAB — GLUCOSE, CAPILLARY
GLUCOSE-CAPILLARY: 70 mg/dL (ref 70–99)
Glucose-Capillary: 88 mg/dL (ref 70–99)

## 2017-11-06 LAB — ANTI-DNA ANTIBODY, DOUBLE-STRANDED: ds DNA Ab: <1 IU/mL (ref 0–9)

## 2017-11-06 LAB — C4 COMPLEMENT: COMPLEMENT C4, BODY FLUID: 24 mg/dL (ref 14–44)

## 2017-11-06 LAB — C3 COMPLEMENT: C3 COMPLEMENT: 117 mg/dL (ref 82–167)

## 2017-11-06 SURGERY — VIDEO BRONCHOSCOPY WITHOUT FLUORO
Anesthesia: Moderate Sedation | Laterality: Bilateral

## 2017-11-06 MED ORDER — FENTANYL CITRATE (PF) 100 MCG/2ML IJ SOLN
INTRAMUSCULAR | Status: AC
  Filled 2017-11-06: qty 4

## 2017-11-06 MED ORDER — CLONAZEPAM 0.125 MG PO TBDP
0.2500 mg | ORAL_TABLET | Freq: Once | ORAL | Status: AC
  Administered 2017-11-06: 0.25 mg via ORAL
  Filled 2017-11-06: qty 2

## 2017-11-06 MED ORDER — MIDAZOLAM HCL 10 MG/2ML IJ SOLN
INTRAMUSCULAR | Status: DC | PRN
Start: 2017-11-06 — End: 2017-11-06
  Administered 2017-11-06: 1 mg via INTRAVENOUS
  Administered 2017-11-06: 2 mg via INTRAVENOUS
  Administered 2017-11-06: 1 mg via INTRAVENOUS

## 2017-11-06 MED ORDER — FENTANYL CITRATE (PF) 100 MCG/2ML IJ SOLN
INTRAMUSCULAR | Status: DC | PRN
  Administered 2017-11-06: 25 ug via INTRAVENOUS
  Administered 2017-11-06: 50 ug via INTRAVENOUS

## 2017-11-06 MED ORDER — LIDOCAINE HCL 2 % EX GEL: 1.0000 "application " | Freq: Once | CUTANEOUS | Status: DC

## 2017-11-06 MED ORDER — FUROSEMIDE 10 MG/ML IJ SOLN
40.0000 mg | Freq: Once | INTRAMUSCULAR | Status: AC
  Administered 2017-11-06: 40 mg via INTRAVENOUS
  Filled 2017-11-06: qty 4

## 2017-11-06 MED ORDER — LIDOCAINE HCL (PF) 1 % IJ SOLN
INTRAMUSCULAR | Status: DC | PRN
  Administered 2017-11-06: 6 mL

## 2017-11-06 MED ORDER — PIPERACILLIN-TAZOBACTAM 3.375 G IVPB
3.3750 g | Freq: Three times a day (TID) | INTRAVENOUS | Status: DC
  Administered 2017-11-06 – 2017-11-08 (×6): 3.375 g via INTRAVENOUS
  Filled 2017-11-06 (×7): qty 50

## 2017-11-06 MED ORDER — SODIUM CHLORIDE 0.9 % IV BOLUS: 500.0000 mL | Freq: Once | INTRAVENOUS | Status: DC

## 2017-11-06 MED ORDER — PHENYLEPHRINE HCL 0.25 % NA SOLN
NASAL | Status: DC | PRN
  Administered 2017-11-06: 2 via NASAL

## 2017-11-06 MED ORDER — ENOXAPARIN SODIUM 40 MG/0.4ML ~~LOC~~ SOLN
40.0000 mg | SUBCUTANEOUS | Status: DC
  Administered 2017-11-06 – 2017-11-07 (×2): 40 mg via SUBCUTANEOUS
  Filled 2017-11-06 (×2): qty 0.4

## 2017-11-06 MED ORDER — LIDOCAINE HCL URETHRAL/MUCOSAL 2 % EX GEL
CUTANEOUS | Status: DC | PRN
  Administered 2017-11-06: 1

## 2017-11-06 MED ORDER — MIDAZOLAM HCL 5 MG/ML IJ SOLN
INTRAMUSCULAR | Status: AC
  Filled 2017-11-06: qty 2

## 2017-11-06 MED ORDER — LEVALBUTEROL HCL 0.63 MG/3ML IN NEBU
0.6300 mg | INHALATION_SOLUTION | Freq: Once | RESPIRATORY_TRACT | Status: AC
  Administered 2017-11-06: 0.63 mg via RESPIRATORY_TRACT
  Filled 2017-11-06: qty 3

## 2017-11-06 MED ORDER — PHENYLEPHRINE HCL 0.25 % NA SOLN: 1.0000 | Freq: Four times a day (QID) | NASAL | Status: DC | PRN

## 2017-11-06 MED ORDER — BUTAMBEN-TETRACAINE-BENZOCAINE 2-2-14 % EX AERO
1.0000 | INHALATION_SPRAY | Freq: Once | CUTANEOUS | Status: AC
  Administered 2017-11-06: 1 via TOPICAL

## 2017-11-06 NOTE — Progress Notes (Signed)
Video bronchoscopy performed Intervention bronchial washings Intervention endotracheal biopsies Pt tolerated well   Kathie Dike RRT

## 2017-11-06 NOTE — Significant Event (Signed)
Rapid Response Event Note  Overview: Respiratory  Initial Focused Assessment: Called by RN about patient having ongoing shortness of breath, per RN, SOB and wheezing have been ongoing throughout the night. Patient was given several nebulizer treatments and was still SOB.  I called the RTs as I was walking over to 5W. The RTs and I came up and saw the patient together.  Lung sounds: diffuse wheezing throughout, + WOB, + SOB, + use of accessory muscles. Oxygen saturations were > 94% on 2L [place].  I was in the process of further assessing the patient and I was called away to a medical emergency.  I did review the CXR, patient was ordered Lasix 40mg  IV and is scheduled to have a bronchoscopy today.   Interventions: - CXR was ordered by [facility] NP  Plan of Care: - Follow Up CXR, monitor UO and I/O, along with respiratory status  Event Summary:   at    Call Time [place] Time 0600 End Time [place], [place]

## 2017-11-06 NOTE — Op Note (Addendum)
[facility] Cardiopulmonary Patient Name: Sheila Morales Date: 11/06/2017 MRN: [id number] Attending MD: Collene Gobble , MD Date of Birth: 1951/03/21 CSN: Finalized Age: 67 Admit Type: Inpatient Gender: Female Procedure:            Bronchoscopy Indications:          Bilateral infiltrate Providers:            Danton Clap "Alex" Idelle Jo RRT, RCP, Ashley Mariner RRT, Collene Gobble, MD Referring MD:          Medicines:            Midazolam 4 mg IV, Fentanyl 75 mcg IV Complications:        No immediate complications Estimated Blood Loss: Estimated blood loss was minimal. Procedure:            Pre-Anesthesia Assessment:                       - A History and Physical has been performed. Patient                        meds and allergies have been reviewed. The risks and                        benefits of the procedure and the sedation options and                        risks were discussed with the patient. All questions                        were answered and informed consent was obtained.                        Patient identification and proposed procedure were                        verified prior to the procedure by the physician in the                        procedure room. Mental Status Examination: alert and                        oriented. Airway Examination: small/crowded                        oropharyngeal airway. Respiratory Examination:                        bibasilar crackles. CV Examination: normal. ASA Grade                        Assessment: II - A patient with mild systemic disease.                        After reviewing the risks and benefits, the patient was                        deemed in satisfactory condition to undergo the  procedure. The anesthesia plan was to use moderate                        sedation / analgesia (conscious sedation). Immediately                        prior to administration of  medications, the patient was                        re-assessed for adequacy to receive sedatives. The                        heart rate, respiratory rate, oxygen saturations, blood                        pressure, adequacy of pulmonary ventilation, and                        response to care were monitored throughout the                        procedure. The physical status of the patient was                        re-assessed after the procedure.                       After obtaining informed consent, the bronchoscope was                        passed under direct vision. Throughout the procedure,                        the patient's blood pressure, pulse, and oxygen                        saturations were monitored continuously. the [id number]                        [id number] scope was introduced through the right nostril                        and advanced to the tracheobronchial tree. The                        procedure was accomplished without difficulty. The                        patient tolerated the procedure well. The total                        duration of the procedure was 22 minutes. Scope In: 1:19:08 PM Scope Out: 1:34:44 PM Findings:      The nasopharynx/oropharynx appears normal. The larynx appears normal.       The vocal cords appear normal. The subglottic space is normal. The       trachea is of normal caliber. There are some protruding areas of mucosa       along the tracheal cartileges, and another more central protrusion. The       more distal area was biopsied. The carina  is sharp. The tracheobronchial       tree was examined to at least the first subsegmental level. Bronchial       mucosa and anatomy are normal with some exception of some erythema,       hyperemia at thr RUL oriface; there are no endobronchial lesions, and no       secretions.      Bronchoalveolar lavage was performed in the RUL anterior segment (B3) of       the lung and sent for cell count,  bacterial culture, viral smears &       culture, and fungal & AFB analysis and cytology and flow cytometry. 120       mL of fluid were instilled. 24 mL were returned. The return was cloudy.       There were no mucoid plugs in the return fluid. Multiple specimens were       obtained and pooled into one specimen, which was sent for analysis.      Endobronchial biopsies of a nodule were performed in the trachea using a       forceps and sent for histopathology examination. Two samples were       obtained. Impression:           - Bilateral infiltrate                       - The airway examination was normal with exception of                        some nodular lesions on the anteroir trachea                       - Bronchoalveolar lavage was performed.                       - An endobronchial biopsy was performed on a tracheal                        nodule Moderate Sedation:      Moderate (conscious) sedation was personally administered by the       pulmonologist. The following parameters were monitored: oxygen       saturation, heart rate, blood pressure, and response to care. Total       physician intraservice time was 22 minutes. Recommendation:       - Await BAL and biopsy results. Procedure Code(s):    --- Professional ---                       [phone], Bronchoscopy, rigid or flexible, including                        fluoroscopic guidance, when performed; with bronchial                        or endobronchial biopsy(s), single or multiple sites                       15056, Bronchoscopy, rigid or flexible, including                        fluoroscopic guidance, when performed; with bronchial  alveolar lavage                       [id number], Moderate sedation services provided by the same                        physician or other qualified health care professional                        performing the diagnostic or therapeutic service that                        the  sedation supports, requiring the presence of an                        independent trained observer to assist in the                        monitoring of the patient's level of consciousness and                        physiological status; initial 15 minutes of                        intraservice time, patient age 87 years or older Diagnosis Code(s):    --- Professional ---                       R91.8, Other nonspecific abnormal finding of lung field CPT copyright 2017 American Medical Association. All rights reserved. The codes documented in this report are preliminary and upon coder review may  be revised to meet current compliance requirements. Collene Gobble, MD Collene Gobble, MD 11/06/2017 2:47:59 PM Number of Addenda: 0

## 2017-11-06 NOTE — Progress Notes (Signed)
Patient anxious and short of breath still after one administration of xopenex.  Schorr, NP notified.  A one time dose of clonazepam and a second dose of xopenex prescribed.  Will administer meds, monitor patient's progress, and notify if unchanged.

## 2017-11-06 NOTE — Interval H&P Note (Signed)
PCCM Interval Note  Patient with bilateral superior infiltrates. Clear sputum. Still smoking 1 pk/day.  Etiology unclear but agree that she is at high risk opportunistic infection.   I have recommended FOB + BAL, explained procedure to her and she agrees.  No barriers identified.   Baltazar Apo, MD, PhD 11/06/2017, 1:15 PM [facility] [phone] or if no answer [phone]

## 2017-11-06 NOTE — Consult Note (Signed)
[facility] CM Primary Care Navigator  11/06/2017  [name] Dec 04, 1950 [id number]   Met withpatient and husband ([name]) at the bedside toidentify possible discharge needs. Patientreportsthatshe was"having increased, severe abdominal pain" and poor per oral intakethatresulted to thisadmission.(Irritable bowel syndrome, acute hypoxic respiratory failure on COPD- continuing empiric antibiotic treatment for HCAP- healthcare-associated pneumonia)  PatientendorsesDr. Pricilla Holm with [place] [facility] as her primary care provider.   [place] toobtain medications without difficulty so far.Husband mentioned about previously prescribed (by pulmonologist) COPD inhaler (unable to remember name)- that was expensive to buy and had ask for generic prescription but was told it was okay not to get it.  Encouraged patient and husband to notify primary provider for any further medication issues in the future- to get assistance/ resources [facility]) when needed.    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  Patient states that she was driving prior to admission, but husband will be providing transportation to her doctors'appointments after discharge.  Patientverbalized thathusband will be herprimary caregiverat home.  Anticipated plan for discharge ishome with Outpatient Rehab per therapy recommendation.  Patientand husband voiced understandingto callprimary care provider'soffice whenshereturnshome,for a post discharge follow-upvisitwithin1- 2 weeksor sooner if needs arise.Patient letter (with PCP's contact number) was provided asareminder.   Explained topatient and husbandregarding [facility] CM services available for health management/ resourcesat home but both denied any pressing needs or concerns at present. Patient's  husband verbalized that they have been managing her health issues at home, so far.  COPD action plan discussed with them as well.  Patientand husband expressedunderstandingto seekreferral from primary care provider to [facility] care management ifdeemed necessary and appropriatefor anyservicesin the nearfuture.   [facility] care management information was provided for futureneeds thatpatientmay have.  Patient however, opted and verbally agreed to [facility] COPD calls to follow-up with her recovery at home.  Referral made for EMMI COPD calls after discharge.   For additional questions please contact:  Edwena Felty A. Bhavik Cabiness, BSN, RN-BC [facility] PRIMARY CARE Navigator Cell: [phone]

## 2017-11-06 NOTE — Progress Notes (Signed)
Schorr, NP notified that patient is still very SOB despite breathing treatments.  She is febrile with a temp of 101.3. Coarse crackles heard in lung bases predominantly on the left side.  Portable CXR, lasix, and decrease in IV maintenance fluids ordered.  After interventions were carried out, the patient became less short of breath with personality and orientation back at baseline.  Will continue to monitor.

## 2017-11-06 NOTE — Progress Notes (Addendum)
PROGRESS NOTE  Sheila Morales  HRC:[id number] DOB: 05/21/1950 DOA: 11/01/2017 PCP: Hoyt Koch, MD   Brief Narrative: Sheila Morales is a 67 y.o. female with a history of tobacco use, COPD, SLE, GERD, IBS, and rheumatic fever who presented to the ED for 1 week of abdominal pain, poor per oral intake worse than her chronic abdominal pain starting after taking empiric ciprofloxacin and flagyl 5/30 as an outpatient for possible diverticulitis despite no inflammation on CT abd/pelvis 5/31. Also had diarrhea after this, and abx stopped 6/3. She also had fevers to 101F at home and continued to have abdominal pain. CT abdomen demonstrated right perinephric stranding and ceftriaxone was started, though urine culture was negative and this was stopped. She developed progressive dyspnea 7/15-7/16, CT chest performed showed bilateral, predominantly upper lobe opacities with broad differential diagnosis. Empiric HCAP coverage was started, RVP negative. Pulmonology consulted 7/17, planning BAL 7/18.   Assessment & Plan: Principal Problem:   AKI (acute kidney injury) ([facility]) Active Problems:   Irritable bowel syndrome   Systemic lupus erythematosus (HCC)   COPD (chronic obstructive pulmonary disease) (HCC)   Hyponatremia   Pyelonephritis   Dehydration   Acute respiratory failure with hypoxia (HCC)   HCAP (healthcare-associated pneumonia)  Acute hypoxic respiratory failure on COPD: With abnormal CT chest, upper lobe predominance.  - Appreciate pulmonology assistance. BAL 7/18.  - Continuing empiric abx for HCAP. Febrile this morning, though PCT improving. ?reliability of leukocytosis with steroids. Sputum Cx pending. Blood cultures NGTD. - Continue steroids and plaquenil as below. ANA, anti-dsDNA pending. Complement levels are not low. No peripheral eosinophilia.  - Continue bronchodilators TID.  - Continue low dose clonazepam as there is some underlying anxiety contributing to dyspnea "attacks."  -  Repeat CXR after episode of respiratory distress this morning with unchanged findings, possibly edema and now has significant crackles. Given lasix x1, stopping IVF's.  SLE with dermatologic manifestations: on belimumab as outpatient. Recently given steroid burst with improvement in rash.  - Continue prednisone with PPI - Continue plaquenil.   AKI: SCr. baseline 0.7, up to 2.3 on admission, prerenal most likely given history and with improvement with IV fluids.  - Stopping IVF's for concern of pulmonary edema, given lasix x1.  - Check BMP again tomorrow.   Metabolic acidosis: Resolved.  - Stop bicarbonate and monitor.  Abdominal pain, poor per oral intake on chronic IBS: No bowel obstruction or inflammation on CT abd/pelvis at admission. - Symptomatic management.    Possible acute pyelonephritis: R>L perinephric stranding without obstructive uropathy on CT. Urine culture negative - Continue vancomycin, zosyn as above  Peripheral neuropathy:  - Continue gabapentin, renally dosing 398m TID. Home dosing is TID 6054m/ 60059m 900m55m DVT prophylaxis: Lovenox Code Status: Full Family Communication: Husband, granddaughter at bedside Disposition Plan: Uncertain.  Consultants:   Pulmonology  Procedures:   BAL planned 7/18.   Antimicrobials:  Ceftriaxone  7/13 - 7/14  Vancomycin, zosyn 7/16 >>   Subjective: Feeling better this morning, though had respiratory distress this morning with fever. No chest pain.   Objective: Vitals:   11/06/17 0515 11/06/17 0541 11/06/17 0808 11/06/17 0837  BP: (!) 160/90  130/72   Pulse: (!) 110  91   Resp:   20   Temp:  (!) 101.3 F (38.5 C) 99 F (37.2 C)   TempSrc:  Rectal Oral   SpO2: 95%  96% 95%  Weight:      Height:  Intake/Output Summary (Last 24 hours) at 11/06/2017 1223 Last data filed at 11/06/2017 0936 Gross per 24 hour  Intake 175.33 ml  Output 1700 ml  Net -1524.67 ml   Filed Weights   11/01/17 1757  Weight:  60.5 kg (133 lb 6.1 oz)   Gen: Chronically ill-appearing female in no distress Pulm: Nonlabored with supplemental oxygen, crackles bilaterally to midlung zones posteriorly. No wheezes.  CV: Regular borderline tachycardia. No murmur, rub, or gallop. No JVD, no dependent edema. GI: Abdomen soft, non-tender, non-distended, with normoactive bowel sounds.  Ext: Warm, no deformities Skin: Papular erythematous eruption across chest and left arm improving. No ulceration or discharge.  Neuro: Alert and oriented. No focal neurological deficits. Psych: Judgement and insight appear fair. Mood anxious & affect broad. Behavior is appropriate.    Data Reviewed: I have personally reviewed following labs and imaging studies  CBC: Recent Labs  Lab 11/02/17 0451 11/03/17 0825 11/04/17 0319 11/05/17 0540 11/06/17 0806  WBC 26.0* 28.8* 25.2* 19.2* 16.7*  NEUTROABS  --   --   --  15.9*  --   HGB 9.9* 11.5* 9.7* 9.8* 10.2*  HCT 30.1* 34.4* 28.6* 29.0* 30.2*  MCV 90.1 87.8 86.7 86.6 86.3  PLT 177 205 215 225 761   Basic Metabolic Panel: Recent Labs  Lab 11/02/17 0451 11/03/17 0825 11/04/17 0319 11/05/17 0540 11/06/17 0551  NA 129* 130* 132* 133* 136  K 4.1 4.1 4.6 4.7 4.0  CL 102 100 104 102 105  CO2 20* 18* 20* 23 23  GLUCOSE 58* 108* 91 108* 78  BUN 28* 30* 29* 24* 19  CREATININE 1.98* 1.85* 1.64* 1.36* 1.15*  CALCIUM 6.9* 8.7* 8.6* 8.9 8.7*   GFR: Estimated Creatinine Clearance: 40.1 mL/min (A) (by C-G formula based on SCr of 1.15 mg/dL (H)). Liver Function Tests: Recent Labs  Lab 11/01/17 0835  AST 30  ALT 28  ALKPHOS 80  BILITOT 0.9  PROT 7.1  ALBUMIN 3.2*   Recent Labs  Lab 11/01/17 0835  LIPASE 27   No results for input(s): AMMONIA in the last 168 hours. Coagulation Profile: No results for input(s): INR, PROTIME in the last 168 hours. Cardiac Enzymes: No results for input(s): CKTOTAL, CKMB, CKMBINDEX, TROPONINI in the last 168 hours. BNP (last 3 results) No results  for input(s): PROBNP in the last 8760 hours. HbA1C: No results for input(s): HGBA1C in the last 72 hours. CBG: Recent Labs  Lab 11/03/17 0840 11/06/17 0526 11/06/17 0836  GLUCAP 99 70 88   Lipid Profile: No results for input(s): CHOL, HDL, LDLCALC, TRIG, CHOLHDL, LDLDIRECT in the last 72 hours. Thyroid Function Tests: No results for input(s): TSH, T4TOTAL, FREET4, T3FREE, THYROIDAB in the last 72 hours. Anemia Panel: No results for input(s): VITAMINB12, FOLATE, FERRITIN, TIBC, IRON, RETICCTPCT in the last 72 hours. Urine analysis:    Component Value Date/Time   COLORURINE AMBER (A) 11/01/2017 1445   APPEARANCEUR CLOUDY (A) 11/01/2017 1445   LABSPEC 1.011 11/01/2017 1445   PHURINE 6.0 11/01/2017 1445   GLUCOSEU NEGATIVE 11/01/2017 1445   HGBUR NEGATIVE 11/01/2017 1445   BILIRUBINUR NEGATIVE 11/01/2017 1445   KETONESUR NEGATIVE 11/01/2017 1445   PROTEINUR 100 (A) 11/01/2017 1445   NITRITE NEGATIVE 11/01/2017 1445   LEUKOCYTESUR LARGE (A) 11/01/2017 1445   Recent Results (from the past 240 hour(s))  Culture, blood (routine x 2)     Status: None (Preliminary result)   Collection Time: 11/03/17  8:25 AM  Result Value Ref Range Status   Specimen  Description BLOOD RIGHT ANTECUBITAL  Final   Special Requests   Final    BOTTLES DRAWN AEROBIC ONLY Blood Culture adequate volume   Culture   Final    NO GROWTH 2 DAYS Performed at [facility], [address], [place], [place] [address]    Report Status PENDING  Incomplete  Culture, blood (routine x 2)     Status: None (Preliminary result)   Collection Time: 11/03/17  8:25 AM  Result Value Ref Range Status   Specimen Description BLOOD LEFT ANTECUBITAL  Final   Special Requests   Final    BOTTLES DRAWN AEROBIC ONLY Blood Culture results may not be optimal due to an inadequate volume of blood received in culture bottles   Culture   Final    NO GROWTH 2 DAYS Performed at [facility], [place] [address] [place],  [place] [address]    Report Status PENDING  Incomplete  Urine culture     Status: None   Collection Time: 11/03/17  5:00 PM  Result Value Ref Range Status   Specimen Description URINE, RANDOM  Final   Special Requests NONE  Final   Culture   Final    NO GROWTH Performed at [facility], [place] [address], [place], [place] [address]    Report Status 11/04/2017 FINAL  Final  MRSA PCR Screening     Status: None   Collection Time: 11/03/17  5:32 PM  Result Value Ref Range Status   MRSA by PCR NEGATIVE NEGATIVE Final    Comment:        The GeneXpert MRSA Assay (FDA approved for NASAL specimens only), is one component of a comprehensive MRSA colonization surveillance program. It is not intended to diagnose MRSA infection nor to guide or monitor treatment for MRSA infections. Performed at [facility], [place] [address], [place], [place] [address]   Culture, expectorated sputum-assessment     Status: None   Collection Time: 11/03/17  5:33 PM  Result Value Ref Range Status   Specimen Description SPUTUM  Final   Special Requests NONE  Final   Sputum evaluation   Final    THIS SPECIMEN IS ACCEPTABLE FOR SPUTUM CULTURE Performed at [facility], [address], [place], [place] [address]    Report Status 11/05/2017 FINAL  Final  Culture, respiratory (NON-Expectorated)     Status: None (Preliminary result)   Collection Time: 11/03/17  5:33 PM  Result Value Ref Range Status   Specimen Description SPUTUM  Final   Special Requests NONE Reflexed from [id number]  Final   Gram Stain   Final    NO WBC SEEN RARE SQUAMOUS EPITHELIAL CELLS PRESENT FEW GRAM POSITIVE COCCI    Culture   Final    CULTURE REINCUBATED FOR BETTER GROWTH Performed at [facility], [place] [address], [place], [place] [address]    Report Status PENDING  Incomplete  Respiratory Panel by PCR     Status: None   Collection Time: 11/04/17  8:38 AM  Result Value Ref Range Status   Adenovirus NOT DETECTED NOT  DETECTED Final   Coronavirus 229E NOT DETECTED NOT DETECTED Final   Coronavirus HKU1 NOT DETECTED NOT DETECTED Final   Coronavirus NL63 NOT DETECTED NOT DETECTED Final   Coronavirus OC43 NOT DETECTED NOT DETECTED Final   Metapneumovirus NOT DETECTED NOT DETECTED Final   Rhinovirus / Enterovirus NOT DETECTED NOT DETECTED Final   Influenza A NOT DETECTED NOT  DETECTED Final   Influenza B NOT DETECTED NOT DETECTED Final   Parainfluenza Virus 1 NOT DETECTED NOT DETECTED Final   Parainfluenza Virus 2 NOT DETECTED NOT DETECTED Final   Parainfluenza Virus 3 NOT DETECTED NOT DETECTED Final   Parainfluenza Virus 4 NOT DETECTED NOT DETECTED Final   Respiratory Syncytial Virus NOT DETECTED NOT DETECTED Final   Bordetella pertussis NOT DETECTED NOT DETECTED Final   Chlamydophila pneumoniae NOT DETECTED NOT DETECTED Final   Mycoplasma pneumoniae NOT DETECTED NOT DETECTED Final    Comment: Performed at [facility], [place] [address] [place], [place] [address]      Radiology Studies: Dg Chest Port 1 View  Result Date: 11/06/2017 CLINICAL DATA:  67 y/o  F; shortness of breath tonight. EXAM: PORTABLE CHEST 1 VIEW COMPARISON:  11/03/2017 chest radiograph.  11/04/2017 chest CT. FINDINGS: Stable cardiac silhouette given projection and technique. Aortic atherosclerosis with calcification. Increased patchy and reticular opacity throughout the lungs diffusely. Small bilateral effusions. No pleural effusion or pneumothorax. No acute osseous abnormality is evident. IMPRESSION: Increased diffuse patchy and reticular opacities which may represent pneumonitis or edema, better characterized on prior CT of chest. Small bilateral effusions. Electronically Signed   By: Kristine Garbe M.D.   On: 11/06/2017 06:40    Scheduled Meds: . [MAR Hold] aspirin EC  81 mg Oral Daily  . [MAR Hold] cholecalciferol  1,000 Units Oral Daily  . [MAR Hold] cycloSPORINE  1 drop Both Eyes BID  . [MAR Hold] enoxaparin  (LOVENOX) injection  30 mg Subcutaneous Q24H  . [MAR Hold] gabapentin  300 mg Oral TID  . [MAR Hold] guaiFENesin  600 mg Oral BID  . [MAR Hold] hydroxychloroquine  200 mg Oral BID  . [MAR Hold] ipratropium-albuterol  3 mL Nebulization TID  . [MAR Hold] pantoprazole  40 mg Oral BID AC  . [MAR Hold] predniSONE  20 mg Oral Q breakfast   Continuous Infusions: . sodium chloride 10 mL/hr at 11/05/17 1900  . sodium chloride Stopped (11/05/17 6222)  . [MAR Hold] piperacillin-tazobactam (ZOSYN)  IV 3.375 g (11/06/17 0539)     LOS: 5 days   Time spent: 35 minutes.  Patrecia Pour, MD [facility] [email or web address] Password [facility] 11/06/2017, 12:23 PM

## 2017-11-06 NOTE — Progress Notes (Signed)
PT Cancellation Note  Patient Details Name: Sheila Morales MRN: [id number] DOB: 10-11-1950   Cancelled Treatment:    Reason Eval/Treat Not Completed: Medical issues which prohibited therapy.  Pt had rapid response called and when  PT checked asked not to be seen today.  Will try again when pt is more able to tolerate therapy.   Ramond Dial 11/06/2017, 10:56 PM   Mee Hives, PT MS Acute Rehab Dept. Number: [place] and [place]

## 2017-11-06 NOTE — Significant Event (Signed)
Rapid Response Event Note  Overview:  Rounding on patient, bedside RN expressed concern regarding respiratory status. Patient appears tachypneic, with some moderate work of breathing. Skin mottled, patient cold and shivering, at first unable to get a spO2. Lung fields sound wheezy and tight, especially on right. Rhonchi intermittent, clears with cough.   Interventions: PRN xopenex given. PRN clonazepam 0.25 mg given. PRN APAP given. Previous meds held for procedure given. Called Dr Bonner Puna to update and for orders.   Plan of Care (if not transferred): Order to change level of care to SD and monitor more frequently. Will give meds time to work and reassess. Call prn  Event Summary: Started 1700 Interventions done at 1750 Followed up done at [place] Patient stable on follow up. She is smiling, breathing comfortably, talking with family, and eating dinner brought by family.   Sheila Morales

## 2017-11-07 ENCOUNTER — Encounter (HOSPITAL_COMMUNITY): Payer: Self-pay | Admitting: Pulmonary Disease

## 2017-11-07 DIAGNOSIS — N179 Acute kidney failure, unspecified: Principal | ICD-10-CM

## 2017-11-07 DIAGNOSIS — J9601 Acute respiratory failure with hypoxia: Secondary | ICD-10-CM

## 2017-11-07 LAB — BASIC METABOLIC PANEL
Anion gap: 13 (ref 5–15)
BUN: 20 mg/dL (ref 8–23)
CHLORIDE: 99 mmol/L (ref 98–111)
CO2: 25 mmol/L (ref 22–32)
Calcium: 8.8 mg/dL — ABNORMAL LOW (ref 8.9–10.3)
Creatinine, Ser: 1.13 mg/dL — ABNORMAL HIGH (ref 0.44–1.00)
GFR calc Af Amer: 57 mL/min — ABNORMAL LOW (ref >60)
GFR calc non Af Amer: 49 mL/min — ABNORMAL LOW (ref >60)
Glucose, Bld: 110 mg/dL — ABNORMAL HIGH (ref 70–99)
POTASSIUM: 4 mmol/L (ref 3.5–5.1)
SODIUM: 137 mmol/L (ref 135–145)

## 2017-11-07 LAB — CULTURE, RESPIRATORY
CULTURE: NORMAL
GRAM STAIN: NONE SEEN

## 2017-11-07 LAB — ACID FAST SMEAR (AFB, MYCOBACTERIA): Acid Fast Smear: NEGATIVE

## 2017-11-07 LAB — CBC
HEMATOCRIT: 31.6 % — AB (ref 36.0–46.0)
Hemoglobin: 10.4 g/dL — ABNORMAL LOW (ref 12.0–15.0)
MCH: 28.8 pg (ref 26.0–34.0)
MCHC: 32.9 g/dL (ref 30.0–36.0)
MCV: 87.5 fL (ref 78.0–100.0)
Platelets: 291 10*3/uL (ref 150–400)
RBC: 3.61 MIL/uL — AB (ref 3.87–5.11)
RDW: 13.4 % (ref 11.5–15.5)
WBC: 12.1 10*3/uL — AB (ref 4.0–10.5)

## 2017-11-07 LAB — COMPLEMENT, TOTAL

## 2017-11-07 LAB — PROCALCITONIN: Procalcitonin: 0.6 ng/mL

## 2017-11-07 LAB — CULTURE, RESPIRATORY W GRAM STAIN

## 2017-11-07 NOTE — Progress Notes (Signed)
Name: Sheila Morales MRN: [id number] DOB: May 01, 1950    ADMISSION DATE:  11/01/2017 CONSULTATION DATE:  11/05/17  REFERRING MD :  Dr. Bonner Puna / [facility]   CHIEF COMPLAINT:  Dyspnea, Hypoxia    HISTORY OF PRESENT ILLNESS:  67 y/o F, current smoker (52 yrs, 2ppd at heaviest, currently one ppd),  with SLE / IBS on Benlysta (recently started, 3-4 doses, once per month dosing for skin flare), Plaquenil, former smoking with severe emphysema/obstructive lung disease admitted 7/13 with reports of abd pain.   The patient had been seen as an outpatient for abdominal pain.  A CT was obtained and she was treated for concern for infection with antibiotics.  While on antibiotics, she had multiple episodes of diarrhea and no relief of abdominal pain.  Given no improvement, she called the office and stopped the antibiotics.  She was given a prednisone taper for concern for IBS flare.  On admit, she was on day 6/12 prednisone.   She was hoping for relief of abdominal pain and reported to the ER.  Prior to admit, she had two episodes of shaking chills.  She was found to have concern for dehydration and was admitted for evaluation.  During the hospitalization, she was noted to have complaints of shortness of breath & hypoxemia requiring O2.  U. Strep antigen was negative.  U. Legionella pending. RVP negative.  A CT of the chest was obtained which demonstrated severe emphysema, upper lobe predominant ground glass opacities, septal thickening.    PCCM consulted for evaluation.    SUBJECTIVE:  Feeling better.  Oxygen at 2 L with sats of 95%   VITAL SIGNS: Temp:  [97.6 F (36.4 C)-99.3 F (37.4 C)] 97.6 F (36.4 C) (07/19 0436) Pulse Rate:  [62-105] 62 (07/19 0017) Resp:  [15-36] 17 (07/19 0017) BP: (126-190)/(65-106) 138/75 (07/19 0800) SpO2:  [83 %-100 %] 95 % (07/19 0810)  PHYSICAL EXAMINATION: General:  Chronically ill appearing female OOB in chair Neuro:  AAOx4, speech clear, MAE HEENT:  MM pink/moist,  [place] O2, areas of hyperpigmentation on bilateral cheeks, and chest but no evidence  classic rash for lupus Cardiovascular:  s1s2 rrr, no m/r/g , SR per tele Lungs:  Bilateral chest excursion, no distress, lungs with bibasilar crackles per bases Abdomen: ND,NT, soft, bsx4 active  Musculoskeletal:  No acute deformities , diminished muscle mass Skin:  Warm/dry, erythematous rash diffusely  Recent Labs  Lab 11/05/17 0540 11/06/17 0551 11/07/17 0416  NA 133* 136 137  K 4.7 4.0 4.0  CL 102 105 99  CO2 _0 BUN 24* 19 20  CREATININE 1.36* 1.15* 1.13*  GLUCOSE 108* 78 110*    Recent Labs  Lab 11/05/17 0540 11/06/17 0806 11/07/17 0416  HGB 9.8* 10.2* 10.4*  HCT 29.0* 30.2* 31.6*  WBC 19.2* 16.7* 12.1*  PLT 225 272 291    Dg Chest Port 1 View  Result Date: 11/06/2017 CLINICAL DATA:  Respiratory distress EXAM: PORTABLE CHEST 1 VIEW COMPARISON:  Chest radiograph 11/06/2017 at 6:16 a.m. FINDINGS: Persistent diffuse interstitial opacities, unchanged. Small pleural effusions with bibasilar atelectasis, also unchanged. No new area of consolidation. IMPRESSION: Unchanged diffuse interstitial opacities, small pleural effusions and bibasilar atelectasis. Electronically Signed   By: Ulyses Jarred M.D.   On: 11/06/2017 18:17   Dg Chest Port 1 View  Result Date: 11/06/2017 CLINICAL DATA:  67 y/o  F; shortness of breath tonight. EXAM: PORTABLE CHEST 1 VIEW COMPARISON:  11/03/2017 chest radiograph.  11/04/2017 chest CT.  FINDINGS: Stable cardiac silhouette given projection and technique. Aortic atherosclerosis with calcification. Increased patchy and reticular opacity throughout the lungs diffusely. Small bilateral effusions. No pleural effusion or pneumothorax. No acute osseous abnormality is evident. IMPRESSION: Increased diffuse patchy and reticular opacities which may represent pneumonitis or edema, better characterized on prior CT of chest. Small bilateral effusions. Electronically Signed   By:  Kristine Garbe M.D.   On: 11/06/2017 06:40      SIGNIFICANT EVENTS  7/13  Admit 7/18 FOB  STUDIES CT Chest 7/15 >> interstitial thickening throughout the lungs bilaterally, slightly more in the RUL, GGO, no consolidation or edema, no adenopathy  CULTURES UC 7/15 >> negative  BCx2 7/15 >>  Sputum 7/15 >>  RVP 7/16 >> negative  U. Strep 7/16 >> negative U. [place] 7/16 >> negative  ANTIBIOTICS  Vanco 7/16 >>  Zosyn 7/16 >>   Complement Levels Ds DNA Ab 11/05/2017:  Less than 1  C4 11/05/2017>> 24 C3 7/17/ 2019 >> 117 Complement component c1q>> Total Complement. 11/05/2017>> 60 ANA 11/05/2017>> Positive  Bronch Specimens / Results( 7/18) Endobronchial biopsy>> Sputum for Fungal >> AFB >> BAL >> GS: ABUNDANT WBC PRESENT,BOTH PMN AND MONONUCLEAR  NO ORGANISMS SEEN  Culture>> Body Fluid Cell Count>> WBC 213, Neutrophils 81%, Monocyte-Macrophage-serous fluid 4%, Eosinophils 1%, Lymphs 14%   PCT 7/19>> 0.6  Ribonucleic Protein 0.0 - 0.9 AI 1.3High     ENA SM Ab Ser-aCnc 0.0 - 0.9 AI <0.2    Scleroderma (Scl-70) (ENA) Antibody, IgG 0.0 - 0.9 AI <0.2    SSA (Ro) (ENA) Antibody, IgG 0.0 - 0.9 AI >8.0High     SSB (La) (ENA) Antibody, IgG 0.0 - 0.9 AI 7.0High     Chromatin Ab SerPl-aCnc 0.0 - 0.9 AI 0.2    Anti JO-1 0.0 - 0.9 AI <0.2    Centromere Ab         ASSESSMENT / PLAN:  Discussion:  67 y/o F with SLE / IBS on Benlysta, Plaquenil, former smoking with severe emphysema/obstructive lung disease admitted 7/13 with reports of abd pain.  Developed acute hypoxic respiratory failure > CT with bilateral GGO (R>L, upper lobe predominant), interseptal thickening.  DDx includes opportunistic infection with immune suppression, RBILD, CAP & pulmonary autoimmune manifesation of lupus.   Acute Hypoxic Respiratory Failure  Severe Obstructive Lung Disease / COPD SLE / IBS - on Benlysta, Plaquenil, Colestid   Differential diagnosis is broad including HCAP, bacterial,  viral infection, opportunistic infection, alveolar hemorrhage from SLE, smoking related RB ILD, There is no reported incidence in literature of pneumonitis from the immunotherapy that she is receiving  WBC is down trending T max 99.3 last 24 hours  Plan: Follow intermittent CXR  O2 as needed to support sats > 90-95% Continue empiric abx for now>> elevated neutrophils in BAL fluid  support infection Follow Bronch final results May need higher dose steroid if BAL final culture does not confirm  infection  Smoking cessation counseling done 7/19 Consider Smoking cessation classes   I have spent 5 minutes counseling patient on smoking cessation this visit.We discussed the use of nicotine replacement therapy as patient is not able to use patches. She is interested in Smoking cessation classes through [facility]  Attending to follow.   Magdalen Spatz, AGACNP-BC [place] [phone] 11/07/2017, 10:01 AM

## 2017-11-07 NOTE — Progress Notes (Signed)
ANTIBIOTIC CONSULT NOTE -follow up  Pharmacy  Consult for  Zosyn Indication: Pyelonephritis  No Known Allergies  Patient Measurements: Height: _0  (157.5 cm) Weight: 133 lb 6.1 oz (60.5 kg) IBW/kg (Calculated) : 50.1   Vital Signs: Temp: 97.6 F (36.4 C) (07/19 0436) Temp Source: Oral (07/19 0436) BP: 138/75 (07/19 0800) Pulse Rate: 87 (07/19 1100) Intake/Output from previous day: 07/18 0701 - 07/19 0700 In: -  Out: 1701 [Urine:1700; Stool:1] Intake/Output from this shift: Total I/O In: 120 [P.O.:120] Out: -   Labs: Recent Labs    11/05/17 0540 11/06/17 0551 11/06/17 0806 11/07/17 0416  WBC 19.2*  --  16.7* 12.1*  HGB 9.8*  --  10.2* 10.4*  PLT 225  --  272 291  CREATININE 1.36* 1.15*  --  1.13*   Estimated Creatinine Clearance: 40.8 mL/min (A) (by C-G formula based on SCr of 1.13 mg/dL (H)). No results for input(s): VANCOTROUGH, VANCOPEAK, VANCORANDOM, GENTTROUGH, GENTPEAK, GENTRANDOM, TOBRATROUGH, TOBRAPEAK, TOBRARND, AMIKACINPEAK, AMIKACINTROU, AMIKACIN in the last 72 hours.   Microbiology: Recent Results (from the past 720 hour(s))  Culture, blood (routine x 2)     Status: None (Preliminary result)   Collection Time: 11/03/17  8:25 AM  Result Value Ref Range Status   Specimen Description BLOOD RIGHT ANTECUBITAL  Final   Special Requests   Final    BOTTLES DRAWN AEROBIC ONLY Blood Culture adequate volume   Culture   Final    NO GROWTH 4 DAYS Performed at [facility], [address] [place], [place] [address]    Report Status PENDING  Incomplete  Culture, blood (routine x 2)     Status: None (Preliminary result)   Collection Time: 11/03/17  8:25 AM  Result Value Ref Range Status   Specimen Description BLOOD LEFT ANTECUBITAL  Final   Special Requests   Final    BOTTLES DRAWN AEROBIC ONLY Blood Culture results may not be optimal due to an inadequate volume of blood received in culture bottles   Culture   Final    NO GROWTH 4 DAYS Performed at  [facility], [place] [address], [place], [place] [address]    Report Status PENDING  Incomplete  Urine culture     Status: None   Collection Time: 11/03/17  5:00 PM  Result Value Ref Range Status   Specimen Description URINE, RANDOM  Final   Special Requests NONE  Final   Culture   Final    NO GROWTH Performed at [facility], [place] [address] [place], [place] [address]    Report Status 11/04/2017 FINAL  Final  MRSA PCR Screening     Status: None   Collection Time: 11/03/17  5:32 PM  Result Value Ref Range Status   MRSA by PCR NEGATIVE NEGATIVE Final    Comment:        The GeneXpert MRSA Assay (FDA approved for NASAL specimens only), is one component of a comprehensive MRSA colonization surveillance program. It is not intended to diagnose MRSA infection nor to guide or monitor treatment for MRSA infections. Performed at [facility], [place] [address] [place],  [address]   Culture, expectorated sputum-assessment     Status: None   Collection Time: 11/03/17  5:33 PM  Result Value Ref Range Status   Specimen Description SPUTUM  Final   Special Requests NONE  Final   Sputum evaluation   Final    THIS SPECIMEN IS ACCEPTABLE FOR SPUTUM CULTURE Performed  at [facility], [place] [address] [place], [place] [address]    Report Status 11/05/2017 FINAL  Final  Culture, respiratory (NON-Expectorated)     Status: None   Collection Time: 11/03/17  5:33 PM  Result Value Ref Range Status   Specimen Description SPUTUM  Final   Special Requests NONE Reflexed from [id number]  Final   Gram Stain   Final    NO WBC SEEN RARE SQUAMOUS EPITHELIAL CELLS PRESENT FEW GRAM POSITIVE COCCI    Culture   Final    MODERATE Consistent with normal respiratory flora. Performed at [facility], [place] [address], [place], [place] [address]    Report Status 11/07/2017 FINAL  Final  Respiratory Panel by PCR     Status: None   Collection Time: 11/04/17  8:38 AM  Result Value  Ref Range Status   Adenovirus NOT DETECTED NOT DETECTED Final   Coronavirus 229E NOT DETECTED NOT DETECTED Final   Coronavirus HKU1 NOT DETECTED NOT DETECTED Final   Coronavirus NL63 NOT DETECTED NOT DETECTED Final   Coronavirus OC43 NOT DETECTED NOT DETECTED Final   Metapneumovirus NOT DETECTED NOT DETECTED Final   Rhinovirus / Enterovirus NOT DETECTED NOT DETECTED Final   Influenza A NOT DETECTED NOT DETECTED Final   Influenza B NOT DETECTED NOT DETECTED Final   Parainfluenza Virus 1 NOT DETECTED NOT DETECTED Final   Parainfluenza Virus 2 NOT DETECTED NOT DETECTED Final   Parainfluenza Virus 3 NOT DETECTED NOT DETECTED Final   Parainfluenza Virus 4 NOT DETECTED NOT DETECTED Final   Respiratory Syncytial Virus NOT DETECTED NOT DETECTED Final   Bordetella pertussis NOT DETECTED NOT DETECTED Final   Chlamydophila pneumoniae NOT DETECTED NOT DETECTED Final   Mycoplasma pneumoniae NOT DETECTED NOT DETECTED Final    Comment: Performed at [facility], [place] [address], [place], [place] [address]  Culture, bal-quantitative     Status: None (Preliminary result)   Collection Time: 11/06/17  1:25 PM  Result Value Ref Range Status   Specimen Description BRONCHIAL ALVEOLAR LAVAGE RIGHT UPPER LUNG  Final   Special Requests Immunocompromised  Final   Gram Stain   Final    ABUNDANT WBC PRESENT,BOTH PMN AND MONONUCLEAR NO ORGANISMS SEEN    Culture   Final    NO GROWTH < 24 HOURS Performed at [facility], [place] [address] [place], [place] [address]    Report Status PENDING  Incomplete    Medical History: Past Medical History:  Diagnosis Date  . Arthritis   . Colitis, ischemic ([place])   . External hemorrhoids   . H/O: rheumatic fever   . IBS (irritable bowel syndrome)   . Personal history colonic adenoma 03/25/2008   06/2007 right sided adenoma and 2 right hyperplastic polyps    . Restless leg syndrome    questionable  . Systemic lupus erythematosus ([place])   . Tubular adenoma of  colon   . 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 Vancomycin has been d/c'd and now zosyn being continued for pyelonephritis. Afebrile, WBC 12.1, CXS NGTD   Plan:  Zosyn 3.375gm IV q8h Monitor for LOT, C&S, and clinical course   Miri Jose A. Levada Dy, PharmD, [place] Pager: [phone] Please utilize Amion for appropriate phone number to reach the unit pharmacist ([place])    11/07/2017,11:17 AM

## 2017-11-07 NOTE — Progress Notes (Signed)
Physical Therapy Treatment Patient Details Name: Sheila Morales MRN: [id number] DOB: 09-25-1950 Today's Date: 11/07/2017    History of Present Illness 67 y.o. female with medical history significant of lupus on plaquenil rheumatic fever,ibs GERD and COPD admitted with abdominal pain for one week.  She feels this pain is somewhat chronic but has gotten worse in the last few days since she started taking Cipro for a UTI. Being treated for hyponatrimia    PT Comments    Pt was seen for further therapy after recent rapid response events for breathing.  Her tolerance for mobility is limited by O2 sats declining with all movement and finally was at 100% at end of session from sitting and resting with gait.  Her plan is to progress to outpatient therapy but will be a bit more restricted for endurance now that her breathing has been compromised.  The O2 sat lowest point on 2L was 88% and then recovered to 100%.     Follow Up Recommendations  Outpatient PT(neuro therapy for balance with hearing)     Equipment Recommendations  None recommended by PT    Recommendations for Other Services       Precautions / Restrictions Precautions Precautions: Fall Restrictions Weight Bearing Restrictions: No    Mobility  Bed Mobility               General bed mobility comments: up in chair when PT arrived  Transfers Overall transfer level: Modified independent Equipment used: Rolling walker (2 wheeled)             General transfer comment: requires assistance with RW to stand including hand placement cues  Ambulation/Gait Ambulation/Gait assistance: Min guard Gait Distance (Feet): 45 Feet Assistive device: Rolling walker (2 wheeled);1 person hand held assist Gait Pattern/deviations: Step-through pattern;Decreased stride length;Wide base of support;Drifts right/left;Trunk flexed Gait velocity: reduced Gait velocity interpretation: <1.31 ft/sec, indicative of household ambulator General  Gait Details: pt requires help to maneuver walker after recent rapid response events from breathing   Stairs             Wheelchair Mobility    Modified Rankin (Stroke Patients Only)       Balance Overall balance assessment: Needs assistance Sitting-balance support: Feet supported Sitting balance-Leahy Scale: Fair     Standing balance support: Bilateral upper extremity supported;During functional activity Standing balance-Leahy Scale: Poor Standing balance comment: requires support of counter or walker to steady for standing and O2 sats drop to 88%                            Cognition Arousal/Alertness: Awake/alert Behavior During Therapy: WFL for tasks assessed/performed Overall Cognitive Status: Within Functional Limits for tasks assessed                                        Exercises General Exercises - Lower Extremity Ankle Circles/Pumps: AROM;Both;5 reps Quad Sets: AROM;Both;10 reps    General Comments General comments (skin integrity, edema, etc.): Pt is losing control of O2 sat with effort even on 2L O2, and did state nursing was hoping to wean O2      Pertinent Vitals/Pain Pain Assessment: No/denies pain    Home Living                      Prior Function  PT Goals (current goals can now be found in the care plan section) Acute Rehab PT Goals Patient Stated Goal: To try to move more PT Goal Formulation: With patient/family Progress towards PT goals: Progressing toward goals    Frequency    Min 3X/week      PT Plan Current plan remains appropriate    Co-evaluation              AM-PAC PT "6 Clicks" Daily Activity  Outcome Measure  Difficulty turning over in bed (including adjusting bedclothes, sheets and blankets)?: A Little Difficulty moving from lying on back to sitting on the side of the bed? : Unable Difficulty sitting down on and standing up from a chair with arms (e.g.,  wheelchair, bedside commode, etc,.)?: Unable Help needed moving to and from a bed to chair (including a wheelchair)?: A Little Help needed walking in hospital room?: A Little Help needed climbing 3-5 steps with a railing? : Total 6 Click Score: 12    End of Session Equipment Utilized During Treatment: Gait belt;Oxygen Activity Tolerance: Patient tolerated treatment well;Treatment limited secondary to medical complications (Comment) Patient left: in chair;with call bell/phone within reach;with chair alarm set;with family/visitor present Nurse Communication: Mobility status PT Visit Diagnosis: Unsteadiness on feet (R26.81);Other abnormalities of gait and mobility (R26.89);Difficulty in walking, not elsewhere classified (R26.2);Other symptoms and signs involving the nervous system (R29.898)     Time: 8250-0370 PT Time Calculation (min) (ACUTE ONLY): 38 min  Charges:  $Gait Training: 8-22 mins $Therapeutic Exercise: 8-22 mins $Therapeutic Activity: 8-22 mins                    G Codes:  Functional Assessment Tool Used: AM-PAC 6 Clicks Basic Mobility    Ramond Dial 11/07/2017, 5:58 PM   Mee Hives, PT MS Acute Rehab Dept. Number: [place]

## 2017-11-07 NOTE — Progress Notes (Signed)
Patient ID: Sheila Morales, female   DOB: [birth year], 67 y.o.   MRN: [id number]  PROGRESS NOTE  Sheila Morales  QAS:[id number] DOB: 06-23-1950 DOA: 11/01/2017 PCP: Hoyt Koch, MD   Brief Narrative: Sheila Morales is a 67 y.o. female with a history of tobacco use, COPD, SLE, GERD, IBS, and rheumatic fever who presented to the ED for 1 week of abdominal pain, poor per oral intake worse than her chronic abdominal pain starting after taking empiric ciprofloxacin and flagyl 5/30 as an outpatient for possible diverticulitis despite no inflammation on CT abd/pelvis 5/31. Also had diarrhea after this, and abx stopped 6/3. She also had fevers to 101F at home and continued to have abdominal pain. CT abdomen demonstrated right perinephric stranding and ceftriaxone was started, though urine culture was negative and this was stopped. She developed progressive dyspnea 7/15-7/16, CT chest performed showed bilateral, predominantly upper lobe opacities with broad differential diagnosis. Empiric HCAP coverage was started, RVP negative. Pulmonology consulted 7/17, planning BAL 7/18.   Assessment & Plan: Principal Problem:   AKI (acute kidney injury) ([place]) Active Problems:   Irritable bowel syndrome   Cough   Systemic lupus erythematosus (HCC)   COPD (chronic obstructive pulmonary disease) (HCC)   Hyponatremia   Pyelonephritis   Dehydration   Acute respiratory failure with hypoxia (HCC)   HCAP (healthcare-associated pneumonia)  Acute hypoxic respiratory failure on COPD: With abnormal CT chest, upper lobe predominance.  - Appreciate pulmonology assistance. BAL 7/18.  - Continuing empiric abx for HCAP.  Clinically improving?reliability of leukocytosis with steroids. Sputum Cx pending. Blood cultures NGTD. - Continue steroids and plaquenil as below. ANA, anti-dsDNA pending. Complement levels are not low. No peripheral eosinophilia.  - Continue bronchodilators TID.  - Continue low dose clonazepam as there is  some underlying anxiety contributing to dyspnea "attacks."  - Repeat CXR after episode of respiratory distress  yesterday with unchanged findings, possibly edema and now has significant crackles. Given lasix x1, stopping IVF's.  SLE with dermatologic manifestations: on belimumab as outpatient. Recently given steroid burst with improvement in rash.  - Continue prednisone with PPI - Continue plaquenil.   AKI: SCr. baseline 0.7, up to 2.3 on admission, prerenal most likely given history and with improvement with IV fluids.  - Stopping IVF's for concern of pulmonary edema, given lasix x1 yesterday.  -Resolved  Metabolic acidosis: Resolved.  - Stop bicarbonate and monitor.  Abdominal pain, poor per oral intake on chronic IBS: No bowel obstruction or inflammation on CT abd/pelvis at admission. - Symptomatic management.    Possible acute pyelonephritis: R>L perinephric stranding without obstructive uropathy on CT. Urine culture negative - Continue vancomycin, zosyn as above  Peripheral neuropathy:  - Continue gabapentin, renally dosing 325m TID. Home dosing is TID 6064m/ 60032m 900m66m DVT prophylaxis: Lovenox Code Status: Full Family Communication: Husband, granddaughter at bedside Disposition Plan: When cleared by pulmonology service  Consultants:   Pulmonology  Procedures:   BAL planned 7/18.   Antimicrobials:  Ceftriaxone  7/13 - 7/14  Vancomycin, zosyn 7/16 >>   Subjective: Patient ready to go home but had an episode last night to think that her anxiety related.  Get out of bed.  Wean oxygen.  She is eager to go home.  Objective: Vitals:   11/07/17 0329 11/07/17 0436 11/07/17 0800 11/07/17 0810  BP:   138/75   Pulse:      Resp:      Temp: 98 F (36.7 C) 97.6  F (36.4 C)    TempSrc: Oral Oral    SpO2:    95%  Weight:      Height:        Intake/Output Summary (Last 24 hours) at 11/07/2017 1005 Last data filed at 11/07/2017 0800 Gross per 24 hour  Intake  120 ml  Output 401 ml  Net -281 ml   Filed Weights   11/01/17 1757  Weight: 60.5 kg (133 lb 6.1 oz)   Gen: Chronically ill-appearing female in no distress Pulm: Nonlabored with supplemental oxygen, no wheezes rhonchi rales clear to auscultation bilaterally  CV: Regular borderline tachycardia. No murmur, rub, or gallop. No JVD, no dependent edema. GI: Abdomen soft, non-tender, non-distended, with normoactive bowel sounds.  Ext: Warm, no deformities Skin: Papular erythematous eruption across chest and left arm improving. No ulceration or discharge.  Neuro: Alert and oriented. No focal neurological deficits. Psych: Judgement and insight appear fair. Mood anxious & affect broad. Behavior is appropriate.    Data Reviewed: I have personally reviewed following labs and imaging studies  CBC: Recent Labs  Lab 11/03/17 0825 11/04/17 0319 11/05/17 0540 11/06/17 0806 11/07/17 0416  WBC 28.8* 25.2* 19.2* 16.7* 12.1*  NEUTROABS  --   --  15.9*  --   --   HGB 11.5* 9.7* 9.8* 10.2* 10.4*  HCT 34.4* 28.6* 29.0* 30.2* 31.6*  MCV 87.8 86.7 86.6 86.3 87.5  PLT 205 215 225 272 053   Basic Metabolic Panel: Recent Labs  Lab 11/03/17 0825 11/04/17 0319 11/05/17 0540 11/06/17 0551 11/07/17 0416  NA 130* 132* 133* 136 137  K 4.1 4.6 4.7 4.0 4.0  CL 100 104 102 105 99  CO2 18* 20* _0 GLUCOSE 108* 91 108* 78 110*  BUN 30* 29* 24* 19 20  CREATININE 1.85* 1.64* 1.36* 1.15* 1.13*  CALCIUM 8.7* 8.6* 8.9 8.7* 8.8*   GFR: Estimated Creatinine Clearance: 40.8 mL/min (A) (by C-G formula based on SCr of 1.13 mg/dL (H)). Liver Function Tests: Recent Labs  Lab 11/01/17 0835  AST 30  ALT 28  ALKPHOS 80  BILITOT 0.9  PROT 7.1  ALBUMIN 3.2*   Recent Labs  Lab 11/01/17 0835  LIPASE 27   No results for input(s): AMMONIA in the last 168 hours. Coagulation Profile: No results for input(s): INR, PROTIME in the last 168 hours. Cardiac Enzymes: No results for input(s): CKTOTAL, CKMB,  CKMBINDEX, TROPONINI in the last 168 hours. BNP (last 3 results) No results for input(s): PROBNP in the last 8760 hours. HbA1C: No results for input(s): HGBA1C in the last 72 hours. CBG: Recent Labs  Lab 11/03/17 0840 11/06/17 0526 11/06/17 0836  GLUCAP 99 70 88   Lipid Profile: No results for input(s): CHOL, HDL, LDLCALC, TRIG, CHOLHDL, LDLDIRECT in the last 72 hours. Thyroid Function Tests: No results for input(s): TSH, T4TOTAL, FREET4, T3FREE, THYROIDAB in the last 72 hours. Anemia Panel: No results for input(s): VITAMINB12, FOLATE, FERRITIN, TIBC, IRON, RETICCTPCT in the last 72 hours. Urine analysis:    Component Value Date/Time   COLORURINE AMBER (A) 11/01/2017 1445   APPEARANCEUR CLOUDY (A) 11/01/2017 1445   LABSPEC 1.011 11/01/2017 1445   PHURINE 6.0 11/01/2017 1445   GLUCOSEU NEGATIVE 11/01/2017 1445   HGBUR NEGATIVE 11/01/2017 1445   BILIRUBINUR NEGATIVE 11/01/2017 1445   KETONESUR NEGATIVE 11/01/2017 1445   PROTEINUR 100 (A) 11/01/2017 1445   NITRITE NEGATIVE 11/01/2017 1445   LEUKOCYTESUR LARGE (A) 11/01/2017 1445   Recent Results (from the past 240  hour(s))  Culture, blood (routine x 2)     Status: None (Preliminary result)   Collection Time: 11/03/17  8:25 AM  Result Value Ref Range Status   Specimen Description BLOOD RIGHT ANTECUBITAL  Final   Special Requests   Final    BOTTLES DRAWN AEROBIC ONLY Blood Culture adequate volume   Culture   Final    NO GROWTH 4 DAYS Performed at [facility], [place] [address] [place], [place] [address]    Report Status PENDING  Incomplete  Culture, blood (routine x 2)     Status: None (Preliminary result)   Collection Time: 11/03/17  8:25 AM  Result Value Ref Range Status   Specimen Description BLOOD LEFT ANTECUBITAL  Final   Special Requests   Final    BOTTLES DRAWN AEROBIC ONLY Blood Culture results may not be optimal due to an inadequate volume of blood received in culture bottles   Culture   Final    NO  GROWTH 4 DAYS Performed at [facility], [place] [address], [place], [place] [address]    Report Status PENDING  Incomplete  Urine culture     Status: None   Collection Time: 11/03/17  5:00 PM  Result Value Ref Range Status   Specimen Description URINE, RANDOM  Final   Special Requests NONE  Final   Culture   Final    NO GROWTH Performed at [facility], [place] [address], [place], [place] [address]    Report Status 11/04/2017 FINAL  Final  MRSA PCR Screening     Status: None   Collection Time: 11/03/17  5:32 PM  Result Value Ref Range Status   MRSA by PCR NEGATIVE NEGATIVE Final    Comment:        The GeneXpert MRSA Assay (FDA approved for NASAL specimens only), is one component of a comprehensive MRSA colonization surveillance program. It is not intended to diagnose MRSA infection nor to guide or monitor treatment for MRSA infections. Performed at [facility], [place] [address], [place], [place] [address]   Culture, expectorated sputum-assessment     Status: None   Collection Time: 11/03/17  5:33 PM  Result Value Ref Range Status   Specimen Description SPUTUM  Final   Special Requests NONE  Final   Sputum evaluation   Final    THIS SPECIMEN IS ACCEPTABLE FOR SPUTUM CULTURE Performed at [facility], [address], [place], [place] [address]    Report Status 11/05/2017 FINAL  Final  Culture, respiratory (NON-Expectorated)     Status: None   Collection Time: 11/03/17  5:33 PM  Result Value Ref Range Status   Specimen Description SPUTUM  Final   Special Requests NONE Reflexed from [id number]  Final   Gram Stain   Final    NO WBC SEEN RARE SQUAMOUS EPITHELIAL CELLS PRESENT FEW GRAM POSITIVE COCCI    Culture   Final    MODERATE Consistent with normal respiratory flora. Performed at [facility], [place] [address], [place], [place] [address]    Report Status 11/07/2017 FINAL  Final  Respiratory Panel by PCR     Status: None   Collection Time:  11/04/17  8:38 AM  Result Value Ref Range Status   Adenovirus NOT DETECTED NOT DETECTED Final   Coronavirus 229E NOT DETECTED NOT DETECTED Final   Coronavirus HKU1 NOT DETECTED NOT DETECTED Final   Coronavirus NL63 NOT DETECTED NOT DETECTED Final  Coronavirus OC43 NOT DETECTED NOT DETECTED Final   Metapneumovirus NOT DETECTED NOT DETECTED Final   Rhinovirus / Enterovirus NOT DETECTED NOT DETECTED Final   Influenza A NOT DETECTED NOT DETECTED Final   Influenza B NOT DETECTED NOT DETECTED Final   Parainfluenza Virus 1 NOT DETECTED NOT DETECTED Final   Parainfluenza Virus 2 NOT DETECTED NOT DETECTED Final   Parainfluenza Virus 3 NOT DETECTED NOT DETECTED Final   Parainfluenza Virus 4 NOT DETECTED NOT DETECTED Final   Respiratory Syncytial Virus NOT DETECTED NOT DETECTED Final   Bordetella pertussis NOT DETECTED NOT DETECTED Final   Chlamydophila pneumoniae NOT DETECTED NOT DETECTED Final   Mycoplasma pneumoniae NOT DETECTED NOT DETECTED Final    Comment: Performed at [facility], [place] [address], [place], [place] [address]  Culture, bal-quantitative     Status: None (Preliminary result)   Collection Time: 11/06/17  1:25 PM  Result Value Ref Range Status   Specimen Description BRONCHIAL ALVEOLAR LAVAGE RIGHT UPPER LUNG  Final   Special Requests Immunocompromised  Final   Gram Stain   Final    ABUNDANT WBC PRESENT,BOTH PMN AND MONONUCLEAR NO ORGANISMS SEEN Performed at [facility], [address], [place], [place] [address]    Culture PENDING  Incomplete   Report Status PENDING  Incomplete      Radiology Studies: Dg Chest Port 1 View  Result Date: 11/06/2017 CLINICAL DATA:  Respiratory distress EXAM: PORTABLE CHEST 1 VIEW COMPARISON:  Chest radiograph 11/06/2017 at 6:16 a.m. FINDINGS: Persistent diffuse interstitial opacities, unchanged. Small pleural effusions with bibasilar atelectasis, also unchanged. No new area of consolidation. IMPRESSION: Unchanged diffuse  interstitial opacities, small pleural effusions and bibasilar atelectasis. Electronically Signed   By: Ulyses Jarred M.D.   On: 11/06/2017 18:17   Dg Chest Port 1 View  Result Date: 11/06/2017 CLINICAL DATA:  67 y/o  F; shortness of breath tonight. EXAM: PORTABLE CHEST 1 VIEW COMPARISON:  11/03/2017 chest radiograph.  11/04/2017 chest CT. FINDINGS: Stable cardiac silhouette given projection and technique. Aortic atherosclerosis with calcification. Increased patchy and reticular opacity throughout the lungs diffusely. Small bilateral effusions. No pleural effusion or pneumothorax. No acute osseous abnormality is evident. IMPRESSION: Increased diffuse patchy and reticular opacities which may represent pneumonitis or edema, better characterized on prior CT of chest. Small bilateral effusions. Electronically Signed   By: Kristine Garbe M.D.   On: 11/06/2017 06:40    Scheduled Meds: . aspirin EC  81 mg Oral Daily  . cholecalciferol  1,000 Units Oral Daily  . cycloSPORINE  1 drop Both Eyes BID  . enoxaparin (LOVENOX) injection  40 mg Subcutaneous Q24H  . gabapentin  300 mg Oral TID  . guaiFENesin  600 mg Oral BID  . hydroxychloroquine  200 mg Oral BID  . ipratropium-albuterol  3 mL Nebulization TID  . pantoprazole  40 mg Oral BID AC  . predniSONE  20 mg Oral Q breakfast   Continuous Infusions: . sodium chloride 10 mL/hr at 11/05/17 1900  . sodium chloride Stopped (11/05/17 1155)  . piperacillin-tazobactam (ZOSYN)  IV 3.375 g (11/07/17 0119)     LOS: 6 days   Time spent: 35 minutes.  Phillips Grout, MD [facility] [email or web address] Password [email or web address] 11/07/2017, 10:05 AM

## 2017-11-08 ENCOUNTER — Inpatient Hospital Stay (HOSPITAL_COMMUNITY): Payer: Medicare Other

## 2017-11-08 LAB — CULTURE, BLOOD (ROUTINE X 2)
Culture: NO GROWTH
Culture: NO GROWTH
SPECIAL REQUESTS: ADEQUATE

## 2017-11-08 LAB — BASIC METABOLIC PANEL
Anion gap: 10 (ref 5–15)
BUN: 19 mg/dL (ref 8–23)
CO2: 27 mmol/L (ref 22–32)
CREATININE: 1 mg/dL (ref 0.44–1.00)
Calcium: 9 mg/dL (ref 8.9–10.3)
Chloride: 100 mmol/L (ref 98–111)
GFR, EST NON AFRICAN AMERICAN: 57 mL/min — AB (ref >60)
Glucose, Bld: 72 mg/dL (ref 70–99)
Potassium: 3.7 mmol/L (ref 3.5–5.1)
Sodium: 137 mmol/L (ref 135–145)

## 2017-11-08 LAB — CBC
HCT: 32.3 % — ABNORMAL LOW (ref 36.0–46.0)
Hemoglobin: 10.6 g/dL — ABNORMAL LOW (ref 12.0–15.0)
MCH: 28.7 pg (ref 26.0–34.0)
MCHC: 32.8 g/dL (ref 30.0–36.0)
MCV: 87.5 fL (ref 78.0–100.0)
PLATELETS: 316 10*3/uL (ref 150–400)
RBC: 3.69 MIL/uL — AB (ref 3.87–5.11)
RDW: 13.4 % (ref 11.5–15.5)
WBC: 14.1 10*3/uL — ABNORMAL HIGH (ref 4.0–10.5)

## 2017-11-08 MED ORDER — AMOXICILLIN-POT CLAVULANATE 875-125 MG PO TABS: 1.0000 | ORAL_TABLET | Freq: Two times a day (BID) | ORAL | 0 refills | Status: AC

## 2017-11-08 NOTE — Discharge Summary (Signed)
Physician Discharge Summary  [name]:[id number] DOB: 1951/01/13 DOA: 11/01/2017  PCP: Hoyt Koch, MD  Admit date: 11/01/2017 Discharge date: 11/08/2017  Time spent: 35  minutes  Recommendations for Outpatient Follow-up:  1. PCP 1 week  Discharge Diagnoses:  Principal Problem:   AKI (acute kidney injury) ([place]) Active Problems:   Irritable bowel syndrome   Cough   Systemic lupus erythematosus (HCC)   COPD (chronic obstructive pulmonary disease) (HCC)   Hyponatremia   Pyelonephritis   Dehydration   Acute respiratory failure with hypoxia (HCC)   HCAP (healthcare-associated pneumonia)   Discharge Condition: Stable and improved  Diet recommendation: Cardiac  Filed Weights   11/01/17 1757  Weight: 60.5 kg (133 lb 6.1 oz)    History of present illness:  Sheila Morales is a 67 y.o. female with a history of tobacco use, COPD, SLE, GERD, IBS, and rheumatic fever who presented to the ED for 1 week of abdominal pain, poor per oral intake worse than her chronic abdominal pain starting after taking empiric ciprofloxacin and flagyl 5/30 as an outpatient for possible diverticulitis despite no inflammation on CT abd/pelvis 5/31. Also had diarrhea after this, and abx stopped 6/3. She also had fevers to 101F at home and continued to have abdominal pain. CT abdomen demonstrated right perinephric stranding and ceftriaxone was started, though urine culture was negative and this was stopped. She developed progressive dyspnea 7/15-7/16, CT chest performed showed bilateral, predominantly upper lobe opacities with broad differential diagnosis. Empiric HCAP coverage was started, RVP negative. Pulmonology consulted 7/17, planning BAL 7/18.  Culture data has been negative thus far.  By the time of discharge patient was weaned down to room air.  She has been in with steroids and has been improving that therefore she would be discharged on taper.  She will be discharged to complete a course of  Augmentin.  She is to follow-up with a pulmonologist Dr. Lamonte Sakai and primary care physician in 1 to 2 weeks.   Discharge Exam: Vitals:   11/08/17 0949 11/08/17 1146  BP: (!) 155/104   Pulse: 87   Resp: (!) 25   Temp:  98.9 F (37.2 C)  SpO2: 94%     General: Alert and oriented x4 no apparent distress cooperative and friendly Cardiovascular: Regular rate and rhythm without murmurs rubs or gallops Respiratory: Clear to auscultation bilaterally no wheezes rhonchi rales  Discharge Instructions   Discharge Instructions    Diet - low sodium heart healthy   Complete by:  As directed    Increase activity slowly   Complete by:  As directed      Allergies as of 11/08/2017   No Known Allergies     Medication List    STOP taking these medications   metroNIDAZOLE 500 MG tablet Commonly known as:  FLAGYL     TAKE these medications   albuterol 108 (90 Base) MCG/ACT inhaler Commonly known as:  PROVENTIL HFA;VENTOLIN HFA Inhale 2 puffs into the lungs every 6 (six) hours as needed for wheezing or shortness of breath.   AMBULATORY NON FORMULARY MEDICATION Medication Name: GI cocktail 5-10 mL every 4-6 hours as needed for pain   amoxicillin-clavulanate 875-125 MG tablet Commonly known as:  AUGMENTIN Take 1 tablet by mouth 2 (two) times daily for 10 days.   ANORO ELLIPTA 62.5-25 MCG/INH Aepb Generic drug:  umeclidinium-vilanterol TAKE 1 PUFF BY MOUTH EVERY DAY   aspirin 81 MG tablet Take 81 mg by mouth daily.   BENLYSTA 200  MG/ML Soaj Generic drug:  Belimumab Inject 1 mL into the skin every 30 (thirty) days. Pt goes to Dr Lenna Gilford to get infusion. 10-11-17   cholecalciferol 1000 units tablet Commonly known as:  VITAMIN D Take 1,000 Units by mouth daily.   colestipol 5 g granules Commonly known as:  COLESTID 1 scoop alternating with 1/2 scoop   gabapentin 300 MG capsule Commonly known as:  NEURONTIN Take 1 capsule (300 mg total) by mouth 3 (three) times daily. 2 po qam, 2  po afternoon, 3 po qhs What changed:    how much to take  when to take this  additional instructions   glucosamine-chondroitin 500-400 MG tablet Take 2 tablets by mouth 2 (two) times daily.   guaiFENesin-dextromethorphan 100-10 MG/5ML syrup Commonly known as:  ROBITUSSIN DM Take 5 mLs by mouth every 4 (four) hours as needed for cough.   hydroxychloroquine 200 MG tablet Commonly known as:  PLAQUENIL Take 200 mg by mouth 2 (two) times daily.   MULTIPLE VITAMIN PO Take 1 tablet by mouth daily.   multivitamin with minerals tablet Take 1 tablet by mouth daily.   Omega-3 1000 MG Caps Take 1,000 mg by mouth daily.   omeprazole 40 MG capsule Commonly known as:  PRILOSEC Take 1 capsule (40 mg total) by mouth daily before breakfast.   pantoprazole 40 MG tablet Commonly known as:  PROTONIX Take 40 mg by mouth daily.   pantoprazole 40 MG tablet Commonly known as:  PROTONIX Take 1 tablet (40 mg total) by mouth 2 (two) times daily before a meal.   predniSONE 5 MG (48) Tbpk tablet Commonly known as:  STERAPRED UNI-PAK 48 TAB Take 40 mg by mouth daily. Pt on dose pack. Started on 10-28-17. On day 3 of therapy   RESTASIS 0.05 % ophthalmic emulsion Generic drug:  cycloSPORINE INSTILL 1 DROP INTO BOTH EYES TWICE A DAY   triamcinolone cream 0.1 % Commonly known as:  KENALOG APPLY TO AFFECTED AREA TWICE A DAY FOR 2 WEEK AS DIRECTED   vitamin C 500 MG tablet Commonly known as:  ASCORBIC ACID Take 500 mg by mouth daily.   vitamin E 1000 UNIT capsule Take 1,000 Units by mouth daily.   zolpidem 5 MG tablet Commonly known as:  AMBIEN TAKE 1 TABLET (5 MG TOTAL) BY MOUTH AT BEDTIME AS NEEDED.      No Known Allergies Follow-up Information    Collene Gobble, MD Follow up in 3 week(s).   Specialty:  Pulmonary Disease Why:  interstitial lung disease wiht h/o lupus Contact information: [address] [address] [phone]        Hoyt Koch, MD  Follow up in 1 week(s).   Specialty:  Internal Medicine Why:  hospital discharge follow up, repeat cbc/bmp at follow up Contact information: [place] [address] [phone]            The results of significant diagnostics from this hospitalization (including imaging, microbiology, ancillary and laboratory) are listed below for reference.    Significant Diagnostic Studies: Ct Abdomen Pelvis Wo Contrast  Result Date: 11/01/2017 CLINICAL DATA:  Diffuse abdominal pain for the last week. Also having constipation. Hx: cholecystectomy, colitis EXAM: CT ABDOMEN AND PELVIS WITHOUT CONTRAST TECHNIQUE: Multidetector CT imaging of the abdomen and pelvis was performed following the standard protocol without IV contrast. COMPARISON:  09/19/2017 FINDINGS: Lower chest: Linear and reticular type opacities are noted at the lung bases, increased from the prior study,  consistent with subsegmental atelectasis. No convincing pneumonia. No pulmonary edema. Heart is normal in size. Hepatobiliary: Decreased attenuation of the liver consistent with fatty infiltration. No liver mass or focal lesion. Gallbladder surgically absent. No bile duct dilation. Pancreas: Unremarkable. No pancreatic ductal dilatation or surrounding inflammatory changes. Spleen: Normal in size without focal abnormality. Adrenals/Urinary Tract: No adrenal masses. Right kidney mildly displaced inferiorly. There is bilateral perinephric stranding that is greater on the right, increased when compared the prior CT. No renal masses. No stones. No hydronephrosis. Ureters are normal in course and caliber. No ureteral stones. Bladder is decompressed but otherwise unremarkable. Stomach/Bowel: Stomach is within normal limits. Appendix appears normal. No evidence of bowel wall thickening, distention, or inflammatory changes. There are colonic diverticula without diverticulitis. No increase in colonic stool. Vascular/Lymphatic: Aortic  atherosclerosis. No enlarged abdominal or pelvic lymph nodes. Reproductive: Uterus and bilateral adnexa are unremarkable. Other: No abdominal wall hernia or abnormality. No abdominopelvic ascites. Musculoskeletal: No fracture or acute finding. No osteoblastic or osteolytic lesions. IMPRESSION: 1. Right greater than left perinephric stranding increased when compared to the prior CT. No evidence of obstructive uropathy. Consider pyelonephritis if there are consistent clinical findings. 2. No evidence of bowel obstruction or inflammation. No increase in stool. 3. Hepatic steatosis. 4. Aortic atherosclerosis. Electronically Signed   By: Lajean Manes M.D.   On: 11/01/2017 17:03   Ct Chest Wo Contrast  Result Date: 11/04/2017 CLINICAL DATA:  67 year old female with shortness of breath for several days. Abnormal chest x-ray. Hyponatremia. Acute renal insufficiency, metabolic acidosis. Recently on a steroid taper. History of lupus, recent cutaneous FLAIR. EXAM: CT CHEST WITHOUT CONTRAST TECHNIQUE: Multidetector CT imaging of the chest was performed following the standard protocol without IV contrast. COMPARISON:  Portable chest 11/03/2017. CT Abdomen and Pelvis 09/19/2017, and more recently 11/01/2017. FINDINGS: Cardiovascular: No cardiomegaly or pericardial effusion. Calcified coronary artery atherosclerosis (series 3, image 93). Vascular patency is not evaluated in the absence of IV contrast. Comparatively mild calcified thoracic and upper abdominal aortic atherosclerosis. Mediastinum/Nodes: Mild to moderate mediastinal lymphadenopathy with individual nodes up to 10 millimeter short axis, and suspected increased hilar nodes although suboptimally evaluated without IV contrast. Lungs/Pleura: New small layering pleural effusions since the CT in May, increased from the recent 11/01/2017 abdomen CT. The major airways are patent. New since May generalized bilateral pulmonary septal thickening with superimposed widespread  bilateral upper ground-glass partially solid opacity, resulting in an intermittent crazy paving appearance. Mild middle lobe involvement, more so on the right. No solid pulmonary consolidation at this time. No cavitary changes. There is mild generalized peribronchial thickening. Upper Abdomen: Stable upper abdomen since 11/01/2017. Musculoskeletal: No acute osseous abnormality identified. Mild pectus excavatum. Occasional mild upper thoracic superior endplate compression which appears chronic. IMPRESSION: 1. Upper lobe predominant generalized pulmonary septal thickening with superimposed ground-glass and patchy solid peribronchial opacity resulting in a "crazy-paving" appearance. Less pronounced middle lobe involvement, greater on the right. Main differential considerations include acute respiratory infection (such as mycoplasma, adenovirus, influenza, and PCP if immunocompromised), and noninfectious acute pulmonary inflammation such as due to vasculitis, lupus. 2. Associated small bilateral layering pleural effusions, increased from the recent abdomen CT and new since May. 3. Reactive appearing mediastinal lymphadenopathy. 4. Calcified coronary artery and Aortic Atherosclerosis (ICD10-I70.0). Electronically Signed   By: Genevie Ann M.D.   On: 11/04/2017 04:50   Dg Chest Port 1 View  Result Date: 11/08/2017 CLINICAL DATA:  Respiratory failure. EXAM: PORTABLE CHEST 1 VIEW COMPARISON:  Chest x-ray dated November 06, 2017. FINDINGS: Stable cardiomediastinal silhouette. Nearly resolved diffuse interstitial thickening and bilateral pleural effusions. Improved aeration at the lung bases. No consolidation or pneumothorax. No acute osseous abnormality. IMPRESSION: 1. Nearly resolved pulmonary interstitial edema and small bilateral pleural effusions. Electronically Signed   By: Titus Dubin M.D.   On: 11/08/2017 08:45   Dg Chest Port 1 View  Result Date: 11/06/2017 CLINICAL DATA:  Respiratory distress EXAM: PORTABLE CHEST  1 VIEW COMPARISON:  Chest radiograph 11/06/2017 at 6:16 a.m. FINDINGS: Persistent diffuse interstitial opacities, unchanged. Small pleural effusions with bibasilar atelectasis, also unchanged. No new area of consolidation. IMPRESSION: Unchanged diffuse interstitial opacities, small pleural effusions and bibasilar atelectasis. Electronically Signed   By: Ulyses Jarred M.D.   On: 11/06/2017 18:17   Dg Chest Port 1 View  Result Date: 11/06/2017 CLINICAL DATA:  67 y/o  F; shortness of breath tonight. EXAM: PORTABLE CHEST 1 VIEW COMPARISON:  11/03/2017 chest radiograph.  11/04/2017 chest CT. FINDINGS: Stable cardiac silhouette given projection and technique. Aortic atherosclerosis with calcification. Increased patchy and reticular opacity throughout the lungs diffusely. Small bilateral effusions. No pleural effusion or pneumothorax. No acute osseous abnormality is evident. IMPRESSION: Increased diffuse patchy and reticular opacities which may represent pneumonitis or edema, better characterized on prior CT of chest. Small bilateral effusions. Electronically Signed   By: Kristine Garbe M.D.   On: 11/06/2017 06:40   Dg Chest Port 1 View  Result Date: 11/03/2017 CLINICAL DATA:  Shortness of breath and cough EXAM: PORTABLE CHEST 1 VIEW COMPARISON:  January 22, 2017 and October 12, 2013 FINDINGS: There is interstitial thickening throughout the lungs bilaterally, slightly more in the right upper lobe region than elsewhere. There is no well-defined edema or consolidation. The heart is upper normal in size with pulmonary vascularity normal. No adenopathy. There is aortic atherosclerosis. No evident bone lesions. IMPRESSION: Fairly diffuse interstitial thickening, most notably in the right upper lobe. Suspect chronic inflammatory type change within the lungs. No frank edema or consolidation evident. Stable cardiac silhouette. No adenopathy. Electronically Signed   By: Lowella Grip III M.D.   On: 11/03/2017  08:31    Microbiology: Recent Results (from the past 240 hour(s))  Culture, blood (routine x 2)     Status: None (Preliminary result)   Collection Time: 11/03/17  8:25 AM  Result Value Ref Range Status   Specimen Description BLOOD RIGHT ANTECUBITAL  Final   Special Requests   Final    BOTTLES DRAWN AEROBIC ONLY Blood Culture adequate volume   Culture   Final    NO GROWTH 4 DAYS Performed at [facility], [address], [place], [place] [address]    Report Status PENDING  Incomplete  Culture, blood (routine x 2)     Status: None (Preliminary result)   Collection Time: 11/03/17  8:25 AM  Result Value Ref Range Status   Specimen Description BLOOD LEFT ANTECUBITAL  Final   Special Requests   Final    BOTTLES DRAWN AEROBIC ONLY Blood Culture results may not be optimal due to an inadequate volume of blood received in culture bottles   Culture   Final    NO GROWTH 4 DAYS Performed at [facility], [place] [address] [place],  [address]    Report Status PENDING  Incomplete  Urine culture     Status: None   Collection Time: 11/03/17  5:00 PM  Result Value Ref Range Status   Specimen Description URINE, RANDOM  Final  Special Requests NONE  Final   Culture   Final    NO GROWTH Performed at [facility], [place] [address], [place], [place] [address]    Report Status 11/04/2017 FINAL  Final  MRSA PCR Screening     Status: None   Collection Time: 11/03/17  5:32 PM  Result Value Ref Range Status   MRSA by PCR NEGATIVE NEGATIVE Final    Comment:        The GeneXpert MRSA Assay (FDA approved for NASAL specimens only), is one component of a comprehensive MRSA colonization surveillance program. It is not intended to diagnose MRSA infection nor to guide or monitor treatment for MRSA infections. Performed at [facility], [place] [address], [place], [place] [address]   Culture, expectorated sputum-assessment     Status: None   Collection Time: 11/03/17  5:33  PM  Result Value Ref Range Status   Specimen Description SPUTUM  Final   Special Requests NONE  Final   Sputum evaluation   Final    THIS SPECIMEN IS ACCEPTABLE FOR SPUTUM CULTURE Performed at [facility], [address] [place], [place] [address]    Report Status 11/05/2017 FINAL  Final  Culture, respiratory (NON-Expectorated)     Status: None   Collection Time: 11/03/17  5:33 PM  Result Value Ref Range Status   Specimen Description SPUTUM  Final   Special Requests NONE Reflexed from [id number]  Final   Gram Stain   Final    NO WBC SEEN RARE SQUAMOUS EPITHELIAL CELLS PRESENT FEW GRAM POSITIVE COCCI    Culture   Final    MODERATE Consistent with normal respiratory flora. Performed at [facility], [place] [address] [place], [place] [address]    Report Status 11/07/2017 FINAL  Final  Respiratory Panel by PCR     Status: None   Collection Time: 11/04/17  8:38 AM  Result Value Ref Range Status   Adenovirus NOT DETECTED NOT DETECTED Final   Coronavirus 229E NOT DETECTED NOT DETECTED Final   Coronavirus HKU1 NOT DETECTED NOT DETECTED Final   Coronavirus NL63 NOT DETECTED NOT DETECTED Final   Coronavirus OC43 NOT DETECTED NOT DETECTED Final   Metapneumovirus NOT DETECTED NOT DETECTED Final   Rhinovirus / Enterovirus NOT DETECTED NOT DETECTED Final   Influenza A NOT DETECTED NOT DETECTED Final   Influenza B NOT DETECTED NOT DETECTED Final   Parainfluenza Virus 1 NOT DETECTED NOT DETECTED Final   Parainfluenza Virus 2 NOT DETECTED NOT DETECTED Final   Parainfluenza Virus 3 NOT DETECTED NOT DETECTED Final   Parainfluenza Virus 4 NOT DETECTED NOT DETECTED Final   Respiratory Syncytial Virus NOT DETECTED NOT DETECTED Final   Bordetella pertussis NOT DETECTED NOT DETECTED Final   Chlamydophila pneumoniae NOT DETECTED NOT DETECTED Final   Mycoplasma pneumoniae NOT DETECTED NOT DETECTED Final    Comment: Performed at [facility], [place] [address], [place], [place] [address]   Culture, bal-quantitative     Status: None (Preliminary result)   Collection Time: 11/06/17  1:25 PM  Result Value Ref Range Status   Specimen Description BRONCHIAL ALVEOLAR LAVAGE RIGHT UPPER LUNG  Final   Special Requests Immunocompromised  Final   Gram Stain   Final    ABUNDANT WBC PRESENT,BOTH PMN AND MONONUCLEAR NO ORGANISMS SEEN    Culture   Final    NO GROWTH < 24 HOURS Performed at [facility], [place],  [place] [address]    Report Status PENDING  Incomplete  Acid Fast Smear (AFB)     Status: None   Collection Time: 11/06/17  1:25 PM  Result Value Ref Range Status   AFB Specimen Processing Concentration  Final   Acid Fast Smear Negative  Final    Comment: (NOTE) Performed At: [facility] [place], [place] [id number] Rush Farmer MD FG:[id number]    Source (AFB) BRONCHIAL ALVEOLAR LAVAGE  Final    Comment: RIGHT UPPER Performed at [facility], [place] [address] [place],  [address]      Labs: Basic Metabolic Panel: Recent Labs  Lab 11/04/17 0319 11/05/17 0540 11/06/17 0551 11/07/17 0416 11/08/17 0438  NA 132* 133* 136 137 137  K 4.6 4.7 4.0 4.0 3.7  CL 104 102 105 99 100  CO2 20* _0 GLUCOSE 91 108* 78 110* 72  BUN 29* 24* _1 CREATININE 1.64* 1.36* 1.15* 1.13* 1.00  CALCIUM 8.6* 8.9 8.7* 8.8* 9.0   Liver Function Tests: No results for input(s): AST, ALT, ALKPHOS, BILITOT, PROT, ALBUMIN in the last 168 hours. No results for input(s): LIPASE, AMYLASE in the last 168 hours. No results for input(s): AMMONIA in the last 168 hours. CBC: Recent Labs  Lab 11/04/17 0319 11/05/17 0540 11/06/17 0806 11/07/17 0416 11/08/17 0438  WBC 25.2* 19.2* 16.7* 12.1* 14.1*  NEUTROABS  --  15.9*  --   --   --   HGB 9.7* 9.8* 10.2* 10.4* 10.6*  HCT 28.6* 29.0* 30.2* 31.6* 32.3*  MCV 86.7 86.6 86.3 87.5 87.5  PLT 215 225 272 291 316   Cardiac Enzymes: No results for input(s): CKTOTAL, CKMB,  CKMBINDEX, TROPONINI in the last 168 hours. BNP: BNP (last 3 results) No results for input(s): BNP in the last 8760 hours.  ProBNP (last 3 results) No results for input(s): PROBNP in the last 8760 hours.  CBG: Recent Labs  Lab 11/03/17 0840 11/06/17 0526 11/06/17 0836  GLUCAP 99 70 88       Signed:  Natthew Marlatt A MD.  [facility] 11/08/2017, 12:28 PM

## 2017-11-09 LAB — CULTURE, BAL-QUANTITATIVE

## 2017-11-09 LAB — CULTURE, BAL-QUANTITATIVE W GRAM STAIN: Culture: 10000 — AB

## 2017-11-10 ENCOUNTER — Telehealth: Payer: Self-pay | Admitting: *Deleted

## 2017-11-10 NOTE — Telephone Encounter (Signed)
Called pt to verify hosp f/u appt that has been made for 11/17/17. Pt states she made appt this am. Completed TCM call below.[name]  Transition Care Management Follow-up Telephone Call   Date discharged? 11/08/17   How have you been since you were released from the hospital? Pt states she is doing ok   Do you understand why you were in the hospital? YES   Do you understand the discharge instructions? YES   Where were you discharged to? Home   Items Reviewed:  Medications reviewed: YES  Allergies reviewed: YES  Dietary changes reviewed: YES, heart healthy  Referrals reviewed: No referral needed   Functional Questionnaire:   Activities of Daily Living (ADLs):   She states she are independent in the following: ambulation, bathing and hygiene, feeding, continence, grooming, toileting and dressing States she doesn't require assistance    Any transportation issues/concerns?: NO   Any patient concerns? NO   Confirmed importance and date/time of follow-up visits scheduled YES, appt 11/17/17  Provider Appointment booked with Dr. Sharlet Salina   Confirmed with patient if condition begins to worsen call PCP or go to the ER.  Patient was given the office number and encouraged to call back with question or concerns.  : YES

## 2017-11-11 LAB — COMPLEMENT COMPONENT C1Q: C1q Complement Protein CC1Q: 11.7 mg/dL — ABNORMAL LOW (ref 11.8–24.4)

## 2017-11-12 ENCOUNTER — Telehealth: Payer: Self-pay | Admitting: Emergency Medicine

## 2017-11-12 NOTE — Telephone Encounter (Signed)
Spoke with the pt  She states d/c'ed from hospital on 11/08/17  While in the hospital they had her on pred taper, but she was unsure if she should finish it  I reviewed her d/c summary and per instructions she is to take the pred taper  She understands this and will complete med  Will keep planned f/u 12/02/17 and call sooner if needed

## 2017-11-17 ENCOUNTER — Ambulatory Visit (INDEPENDENT_AMBULATORY_CARE_PROVIDER_SITE_OTHER): Payer: Medicare Other | Admitting: Internal Medicine

## 2017-11-17 ENCOUNTER — Encounter: Payer: Self-pay | Admitting: Internal Medicine

## 2017-11-17 ENCOUNTER — Inpatient Hospital Stay: Payer: Medicare Other | Admitting: Internal Medicine

## 2017-11-17 ENCOUNTER — Other Ambulatory Visit (INDEPENDENT_AMBULATORY_CARE_PROVIDER_SITE_OTHER): Payer: Medicare Other

## 2017-11-17 VITALS — BP 110/60 | HR 94 | Temp 98.1°F | Ht 62.0 in | Wt 129.0 lb

## 2017-11-17 DIAGNOSIS — J449 Chronic obstructive pulmonary disease, unspecified: Secondary | ICD-10-CM | POA: Diagnosis not present

## 2017-11-17 DIAGNOSIS — N179 Acute kidney failure, unspecified: Secondary | ICD-10-CM | POA: Diagnosis not present

## 2017-11-17 DIAGNOSIS — J189 Pneumonia, unspecified organism: Secondary | ICD-10-CM

## 2017-11-17 LAB — COMPREHENSIVE METABOLIC PANEL
ALBUMIN: 3.7 g/dL (ref 3.5–5.2)
ALK PHOS: 71 U/L (ref 39–117)
ALT: 40 U/L — ABNORMAL HIGH (ref 0–35)
AST: 23 U/L (ref 0–37)
BUN: 17 mg/dL (ref 6–23)
CALCIUM: 9.7 mg/dL (ref 8.4–10.5)
CHLORIDE: 95 meq/L — AB (ref 96–112)
CO2: 29 mEq/L (ref 19–32)
Creatinine, Ser: 0.96 mg/dL (ref 0.40–1.20)
GFR: 61.42 mL/min (ref >60.00)
Glucose, Bld: 97 mg/dL (ref 70–99)
POTASSIUM: 3.9 meq/L (ref 3.5–5.1)
SODIUM: 133 meq/L — AB (ref 135–145)
TOTAL PROTEIN: 7.5 g/dL (ref 6.0–8.3)
Total Bilirubin: 0.4 mg/dL (ref 0.2–1.2)

## 2017-11-17 LAB — CBC
HEMATOCRIT: 32.3 % — AB (ref 36.0–46.0)
HEMOGLOBIN: 10.9 g/dL — AB (ref 12.0–15.0)
MCHC: 33.7 g/dL (ref 30.0–36.0)
MCV: 88.1 fl (ref 78.0–100.0)
PLATELETS: 381 10*3/uL (ref 150.0–400.0)
RBC: 3.67 Mil/uL — AB (ref 3.87–5.11)
RDW: 13.4 % (ref 11.5–15.5)
WBC: 8.1 10*3/uL (ref 4.0–10.5)

## 2017-11-17 NOTE — Patient Instructions (Signed)
We are checking the labs today and will call you back.

## 2017-11-17 NOTE — Progress Notes (Signed)
   Subjective:    Patient ID: Sheila Morales, female    DOB: [birth year], 67 y.o.   MRN: [id number]  HPI The patient is a 67 YO female coming in for hospital follow up (in for AKI and pneumonia/sepsis with BAL done without growth except normal flora). She was treated with antibiotics in the hospital. She is doing better on discharge. Still smoking some. Does still have some cough and SOB. Overall is okay. Doing all ADLs independent. Still taking augmentin. There was some concern on imaging for lupus in the lungs versus infection. This did clear on CXR with antibiotics. She is feeling about 70% better. Still some tired and eating a little bit better. Denies constipation but is having mild diarrhea from antibiotic. Drinking liquids normally. Denies abdominal pain.   PMH, [facility], social history reviewed and updated.   Review of Systems  Constitutional: Positive for activity change and fatigue. Negative for appetite change, fever and unexpected weight change.  HENT: Negative.   Eyes: Negative.   Respiratory: Positive for cough. Negative for chest tightness and shortness of breath.        Improving  Cardiovascular: Negative for chest pain, palpitations and leg swelling.  Gastrointestinal: Positive for diarrhea. Negative for abdominal distention, abdominal pain, constipation, nausea and vomiting.  Musculoskeletal: Negative.   Skin: Negative.   Neurological: Negative.   Psychiatric/Behavioral: Negative.       Objective:   Physical Exam  Constitutional: She is oriented to person, place, and time. She appears well-developed and well-nourished.  HENT:  Head: Normocephalic and atraumatic.  Eyes: EOM are normal.  Neck: Normal range of motion.  Cardiovascular: Normal rate and regular rhythm.  Pulmonary/Chest: Effort normal and breath sounds normal. No respiratory distress. She has no wheezes. She has no rales.  Abdominal: Soft. Bowel sounds are normal. She exhibits no distension. There is no tenderness.  There is no rebound.  Musculoskeletal: She exhibits no edema.  Neurological: She is alert and oriented to person, place, and time. Coordination normal.  Skin: Skin is warm and dry.  Psychiatric: She has a normal mood and affect.   Vitals:   11/17/17 1436  BP: 110/60  Pulse: 94  Temp: 98.1 F (36.7 C)  TempSrc: Oral  SpO2: 96%  Weight: 129 lb (58.5 kg)  Height: _0  (1.575 m)      Assessment & Plan:

## 2017-11-18 NOTE — Assessment & Plan Note (Signed)
Will finish augmentin and has several days left. CXR done with clearance prior to D/C so likely does not need separate CXR for clearance after antibiotics unless she has persistent symptoms.

## 2017-11-18 NOTE — Assessment & Plan Note (Signed)
She is asked to finish the augmentin she has for infection although she states she still has 3-4 days and she should only have 1 more day of medication. CXR clear prior to discharge. Continue her breathing medications including albuterol prn and anoro

## 2017-11-18 NOTE — Assessment & Plan Note (Signed)
Needs repeat CBC and CMP today which are ordered although kidney function was fairly normal on discharge from hospital.

## 2017-11-19 ENCOUNTER — Encounter

## 2017-11-19 ENCOUNTER — Telehealth: Payer: Self-pay | Admitting: Emergency Medicine

## 2017-11-19 ENCOUNTER — Ambulatory Visit (INDEPENDENT_AMBULATORY_CARE_PROVIDER_SITE_OTHER): Payer: Medicare Other | Admitting: Physician Assistant

## 2017-11-19 ENCOUNTER — Encounter: Payer: Self-pay | Admitting: Physician Assistant

## 2017-11-19 VITALS — BP 124/70 | HR 96 | Ht 62.5 in | Wt 127.1 lb

## 2017-11-19 DIAGNOSIS — R194 Change in bowel habit: Secondary | ICD-10-CM

## 2017-11-19 MED ORDER — SACCHAROMYCES BOULARDII 250 MG PO CAPS: 250.0000 mg | ORAL_CAPSULE | Freq: Two times a day (BID) | ORAL | 1 refills | Status: DC

## 2017-11-19 NOTE — Patient Instructions (Signed)
We have sent the following medications to your pharmacy for you to pick up at your convenience: Florastor 1 tablet twice a day for 2 months.

## 2017-11-19 NOTE — Progress Notes (Addendum)
Chief Complaint: Change in bowel habits  HPI:    Sheila Morales is a 67 year old female, known to Dr. Carlean Purl, who presents to clinic today with a complaint of a change in her bowel habits.    10/22/2017 office visit discussed some nausea and epigastric pain and severe diarrhea after starting Cipro and Flagyl for suspected "intestinal infection".  At that time stopped omeprazole and started pantoprazole 40 mg twice daily, also prescribed GI cocktail and recommended she call back and let us know how she is doing in a couple of weeks.    Patient was hospitalized 11/01/2017-11/08/2017 with acute kidney injury as well as a CT showing right perinephric stranding, urine culture negative, also progressive dyspnea with signs of pneumonia.  Patient requiring oxygen during hospitalization.  She was discharged with Augmentin.    Today, explains that since being in the hospital she has much improved overall.  She is now able to resume most of her daily activities without the help of a walker or other.  She would like to discuss her change in bowel habits, currently will not have a bowel movement for a couple of days and then will have one followed by multiple loose ones throughout the day.  Patient tells me she is used to diarrhea and this is just "not normal for her".  Denies any hematochezia.  Just finished Augmentin for pneumonia yesterday.    Denies fever or chills.  Past Medical History:  Diagnosis Date  . Arthritis   . Colitis, ischemic ([place])   . External hemorrhoids   . H/O: rheumatic fever   . IBS (irritable bowel syndrome)   . Personal history colonic adenoma 03/25/2008   06/2007 right sided adenoma and 2 right hyperplastic polyps    . Restless leg syndrome    questionable  . Systemic lupus erythematosus ([place])   . Tubular adenoma of colon   . 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 CHOLECYSTECTOMY    . CHOLECYSTECTOMY, LAPAROSCOPIC    .  COLONOSCOPY    . ESOPHAGOGASTRODUODENOSCOPY    . lesion, vulva excision    . TONSILLECTOMY    . VIDEO BRONCHOSCOPY Bilateral 11/06/2017   Procedure: VIDEO BRONCHOSCOPY WITHOUT FLUORO;  Surgeon: Marshell Garfinkel, MD;  Location: [facility];  Service: Cardiopulmonary;  Laterality: Bilateral;    Current Outpatient Medications  Medication Sig Dispense Refill  . albuterol (PROVENTIL HFA;VENTOLIN HFA) 108 (90 Base) MCG/ACT inhaler Inhale 2 puffs into the lungs every 6 (six) hours as needed for wheezing or shortness of breath. 1 Inhaler 5  . AMBULATORY NON FORMULARY MEDICATION Medication Name: GI cocktail 5-10 mL every 4-6 hours as needed for pain 450 mL 1  . aspirin 81 MG tablet Take 81 mg by mouth daily.    . Belimumab (BENLYSTA) 200 MG/ML SOAJ Inject 1 mL into the skin every 30 (thirty) days. Pt goes to Dr Lenna Gilford to get infusion. 10-11-17    . cholecalciferol (VITAMIN D) 1000 UNITS tablet Take 1,000 Units by mouth daily.      . colestipol (COLESTID) 5 G granules 1 scoop alternating with 1/2 scoop 500 g 12  . gabapentin (NEURONTIN) 300 MG capsule Take 1 capsule (300 mg total) by mouth 3 (three) times daily. 2 po qam, 2 po afternoon, 3 po qhs (Patient taking differently: Take 600-900 mg by mouth See admin instructions. 600 mg every morning & afternoon, 900 mg at bedtime) 630 capsule 1  .  glucosamine-chondroitin 500-400 MG tablet Take 2 tablets by mouth 2 (two) times daily.    Marland Kitchen guaiFENesin-dextromethorphan (ROBITUSSIN DM) 100-10 MG/5ML syrup Take 5 mLs by mouth every 4 (four) hours as needed for cough. 118 mL 0  . hydroxychloroquine (PLAQUENIL) 200 MG tablet Take 200 mg by mouth 2 (two) times daily.    . MULTIPLE VITAMIN PO Take 1 tablet by mouth daily.      . Omega-3 1000 MG CAPS Take 1,000 mg by mouth daily.    . pantoprazole (PROTONIX) 40 MG tablet Take 1 tablet (40 mg total) by mouth 2 (two) times daily before a meal. 60 tablet 3  . predniSONE (STERAPRED UNI-PAK 48 TAB) 5 MG (48) TBPK tablet Take  40 mg by mouth daily. Pt on dose pack. Started on 10-28-17. On day 3 of therapy  0  . RESTASIS 0.05 % ophthalmic emulsion INSTILL 1 DROP INTO BOTH EYES TWICE A DAY  6  . triamcinolone cream (KENALOG) 0.1 % APPLY TO AFFECTED AREA TWICE A DAY FOR 2 WEEK AS DIRECTED  0  . vitamin C (ASCORBIC ACID) 500 MG tablet Take 500 mg by mouth daily.      . vitamin E 1000 UNIT capsule Take 1,000 Units by mouth daily.    Marland Kitchen zolpidem (AMBIEN) 5 MG tablet TAKE 1 TABLET (5 MG TOTAL) BY MOUTH AT BEDTIME AS NEEDED. 30 tablet 4   No current facility-administered medications for this visit.     Allergies as of 11/19/2017  . (No Known Allergies)    Family History  Problem Relation Age of Onset  . Stroke Mother        Deceased, 75s  . Atrial fibrillation Father   . Colon cancer Father   . Other Sister        Pick's disease, deceased  . Healthy Daughter     Social History   Socioeconomic History  . Marital status: Married    Spouse name: Not on file  . Number of children: 2  . Years of education: 40  . Highest education level: Not on file  Occupational History  . Occupation: Education administrator business  Social Needs  . Financial resource strain: Not on file  . Food insecurity:    Worry: Not on file    Inability: Not on file  . Transportation needs:    Medical: Not on file    Non-medical: Not on file  Tobacco Use  . Smoking status: Current Every Day Smoker    Packs/day: 0.50    Years: 30.00    Pack years: 15.00    Types: Cigarettes  . Smokeless tobacco: Never Used  . Tobacco comment: Declined info  Substance and Sexual Activity  . Alcohol use: Yes    Alcohol/week: 3.0 oz    Types: 5 Standard drinks or equivalent per week    Comment: 2 glasses of wine nightly, 4 beers nightly on weekend  . Drug use: No  . Sexual activity: Yes  Lifestyle  . Physical activity:    Days per week: Not on file    Minutes per session: Not on file  . Stress: Not on file  Relationships  . Social connections:    Talks  on phone: Not on file    Gets together: Not on file    Attends religious service: Not on file    Active member of club or organization: Not on file    Attends meetings of clubs or organizations: Not on file    Relationship status:  Not on file  . Intimate partner violence:    Fear of current or ex partner: Not on file    Emotionally abused: Not on file    Physically abused: Not on file    Forced sexual activity: Not on file  Other Topics Concern  . Not on file  Social History Narrative   HSG. Married '71. 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        Review of Systems:    Constitutional: No fever or chills Cardiovascular: No chest pain Respiratory: No SOB  Gastrointestinal: See HPI and otherwise negative   Physical Exam:  Vital signs: BP 124/70 (BP Location: Left Arm, Patient Position: Sitting, Cuff Size: Normal)   Pulse 96   Ht 5' 2.5" (1.588 m) Comment: height measured without shoes  Wt 127 lb 2 oz (57.7 kg)   BMI 22.88 kg/m   Constitutional:   Pleasant Elderly Caucasian female appears to be in NAD, Well developed, Well nourished, alert and cooperative Respiratory: Respirations even and unlabored. Lungs clear to auscultation bilaterally.   No wheezes, crackles, or rhonchi.  Cardiovascular: Normal S1, S2. No MRG. Regular rate and rhythm. No peripheral edema, cyanosis or pallor.  Gastrointestinal:  Soft, nondistended, nontender. No rebound or guarding. Normal bowel sounds. No appreciable masses or hepatomegaly. Psychiatric:Demonstrates good judgement and reason without abnormal affect or behaviors.  RELEVANT LABS AND IMAGING: CBC    Component Value Date/Time   WBC 8.1 11/17/2017 1507   RBC 3.67 (L) 11/17/2017 1507   HGB 10.9 (L) 11/17/2017 1507   HGB 13.1 01/13/2009 1108   HCT 32.3 (L) 11/17/2017 1507   HCT 37.5 01/13/2009 1108   PLT 381.0 11/17/2017 1507   PLT 181 01/13/2009 1108    MCV 88.1 11/17/2017 1507   MCV 86.2 01/13/2009 1108   MCH 28.7 11/08/2017 0438   MCHC 33.7 11/17/2017 1507   RDW 13.4 11/17/2017 1507   RDW 12.2 01/13/2009 1108   LYMPHSABS 0.9 11/05/2017 0540   LYMPHSABS 2.2 01/13/2009 1108   MONOABS 1.9 (H) 11/05/2017 0540   MONOABS 0.7 01/13/2009 1108   EOSABS 0.0 11/05/2017 0540   EOSABS 0.1 01/13/2009 1108   BASOSABS 0.0 11/05/2017 0540   BASOSABS 0.0 01/13/2009 1108    CMP     Component Value Date/Time   NA 133 (L) 11/17/2017 1507   K 3.9 11/17/2017 1507   CL 95 (L) 11/17/2017 1507   CO2 29 11/17/2017 1507   GLUCOSE 97 11/17/2017 1507   BUN 17 11/17/2017 1507   CREATININE 0.96 11/17/2017 1507   CALCIUM 9.7 11/17/2017 1507   PROT 7.5 11/17/2017 1507   ALBUMIN 3.7 11/17/2017 1507   AST 23 11/17/2017 1507   ALT 40 (H) 11/17/2017 1507   ALKPHOS 71 11/17/2017 1507   BILITOT 0.4 11/17/2017 1507   GFRNONAA 57 (L) 11/08/2017 0438   GFRAA >60 11/08/2017 0438    Assessment: 1.  Change in bowel habits: Patient is leaning towards constipated with a bowel movement every couple of days and then small bowel movements afterwards when she urinates, was on Augmentin until yesterday, history of IBS; most likely patient's IBS after antibiotics and hospitalization  Plan: 1.  Recommend patient start Florastor 1 tab twice daily for the next month and repeat for a month. 2.  Discussed with patient that she should give [place] a call if she still does not feel better over the next month. 3.  Patient  to follow-up as needed with Dr. Carlean Purl or myself.  Ellouise Newer, PA-C [facility] 11/19/2017, 9:28 AM  Cc: Hoyt Koch, *   Agree with Ms. Lilith Solana's evaluation and management.  Gatha Mayer, MD, [facility]

## 2017-11-19 NOTE — Telephone Encounter (Signed)
Attempted to call pt. I did not receive an answer. I have left a message for pt to return our call.  

## 2017-11-20 NOTE — Telephone Encounter (Signed)
Called and spoke with pt regarding prednisone Pt on prednisone for Lupus, and wanted to see if RB wants her to remain on prednisone Pt advised that she has hospital f/u with RB in 2 weeks, and will wait until then Nothing further needed at this time.

## 2017-12-01 ENCOUNTER — Telehealth: Payer: Self-pay | Admitting: Physician Assistant

## 2017-12-01 NOTE — Telephone Encounter (Signed)
The pt states she took dulcolax and went to the bathroom all night after a period of constipation.  She was advised to try miralax 1/2 to 1 capful daily for constipation and titrate as needed and call back with any further concerns

## 2017-12-02 ENCOUNTER — Encounter: Payer: Self-pay | Admitting: Emergency Medicine

## 2017-12-02 ENCOUNTER — Ambulatory Visit (INDEPENDENT_AMBULATORY_CARE_PROVIDER_SITE_OTHER): Payer: Medicare Other | Admitting: Emergency Medicine

## 2017-12-02 ENCOUNTER — Ambulatory Visit (INDEPENDENT_AMBULATORY_CARE_PROVIDER_SITE_OTHER)
Admission: RE | Admit: 2017-12-02 | Discharge: 2017-12-02 | Disposition: A | Discharge status: Home / Self Care | Payer: Medicare Other | Source: Ambulatory Visit | Attending: Emergency Medicine | Admitting: Emergency Medicine

## 2017-12-02 VITALS — BP 130/68 | HR 100 | Ht 62.0 in | Wt 130.4 lb

## 2017-12-02 DIAGNOSIS — J189 Pneumonia, unspecified organism: Secondary | ICD-10-CM

## 2017-12-02 DIAGNOSIS — J449 Chronic obstructive pulmonary disease, unspecified: Secondary | ICD-10-CM

## 2017-12-02 DIAGNOSIS — F17200 Nicotine dependence, unspecified, uncomplicated: Secondary | ICD-10-CM

## 2017-12-02 DIAGNOSIS — J90 Pleural effusion, not elsewhere classified: Secondary | ICD-10-CM | POA: Diagnosis not present

## 2017-12-02 MED ORDER — TIOTROPIUM BROMIDE MONOHYDRATE 18 MCG IN CAPS: 18.0000 ug | ORAL_CAPSULE | Freq: Every day | RESPIRATORY_TRACT | 6 refills | Status: DC

## 2017-12-02 NOTE — Progress Notes (Signed)
Sheila Morales    [id number]    [birth year]  Primary Care Physician:Crawford, Real Cons, MD  Referring Physician: Hoyt Koch, MD [place], [place] [id number]  Chief complaint:  COPD  HPI: Sheila Morales is a 67 yo F w/ IBS, Lupus and diverticulitis presenting to the clinic after recent hospitalization for diarrhea induced dehydration 2/2 antibiotic use. She was found to have pulmonary infiltrates on Chest imaging and was treated for presumed hospital-acquired pneumonia and discharged with antibiotics and short course of prednisone. Since then she has finished both her prednisone and antibiotics. She has had no productive sputum and only mild dyspnea on exertion. No episodes of F/N/V.    She also has COPD which previously managed with Anoro. She states she has stopped refilling Anoro because it was too expensive and she is in the donut hole. She is using her rescue inhaler 1-2 times every day. She is having no night time cough but is endorsing some dyspnea on exertion during yard work.  Smoking history: <50 pack year smoker. Heaviest at 2 ppd. Currently smoking 1/4 pack a day.  Allergies as of 12/02/2017  . (No Known Allergies)    Past Medical History:  Diagnosis Date  . Arthritis   . Colitis, ischemic ([place])   . External hemorrhoids   . H/O: rheumatic fever   . IBS (irritable bowel syndrome)   . Personal history colonic adenoma 03/25/2008   06/2007 right sided adenoma and 2 right hyperplastic polyps    . Restless leg syndrome    questionable  . Systemic lupus erythematosus ([place])   . Tubular adenoma of colon   . 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 CHOLECYSTECTOMY    . CHOLECYSTECTOMY, LAPAROSCOPIC    . COLONOSCOPY    . ESOPHAGOGASTRODUODENOSCOPY    . lesion, vulva excision    . TONSILLECTOMY    . VIDEO BRONCHOSCOPY Bilateral 11/06/2017   Procedure: VIDEO BRONCHOSCOPY WITHOUT FLUORO;   Surgeon: Marshell Garfinkel, MD;  Location: [facility];  Service: Cardiopulmonary;  Laterality: Bilateral;     Review of systems: Review of Systems  Constitutional: Negative for fever and chills.  HENT: Negative.   Eyes: Negative for blurred vision.  Respiratory: as per HPI  Cardiovascular: Negative for chest pain and palpitations.  Gastrointestinal: Negative for vomiting, diarrhea, blood per rectum. Genitourinary: Negative for dysuria, urgency, frequency and hematuria.  Musculoskeletal: Negative for myalgias, back pain and joint pain.  All other systems reviewed and are negative.  Physical Exam: Blood pressure 130/68, pulse 100, height 5\' 2"  (1.575 m), weight 130 lb 6.4 oz (59.1 kg), SpO2 92 %. Gen:      Pleasant female in no acute distress HEENT:  EOMI, sclera anicteric Neck:     No masses; no thyromegaly Lungs:    Distant breath sounds, normal respiratory effort; no wheeze CV:         Regular rate and rhythm; no murmurs Abd:      + bowel sounds; soft, non-tender; no palpable masses, no distension Ext:    No edema; adequate peripheral perfusion Skin:      Warm and dry; Reddish coloration of skin most severe on face and chest Neuro: alert and oriented x 3 Psych: normal mood and affect  A/P: COPD (chronic obstructive pulmonary disease) ([place]) Sheila Morales presents with COPD previously managed with Anoro. Patient is having daily sx with 1-2  time daily rescue inhaler use. She is unable to continue to afford Anoro, we will try Spiriva and see if its cheaper  - Start Spiriva 80mcg 1 puff daily - We will try to get her insurance company formulary  - F/u in 3 months  HCAP (healthcare-associated pneumonia) Sheila Morales presents after hospitalization for diarrhea complicated by presumed healthcare-associated pneumonia 2/2 pulmonary infiltrates on chest X-ray. Suspicion for pneumonia being cause of her infiltrate is low as all of her cultures turned out negative. It may have been caused by her  Benlysta for Lupus.  - Follow up Chest X-ray today - Recommend she discuss with her rheumatologist about Benlysta side effect profile  TOBACCO USE DISORDER/SMOKER-SMOKING CESSATION DISCUSSED >50 pack year smoker, used to smoke 2ppd. Currently down to 5-6 cigarettes per day.  - Recommend continuing to decrease her tobacco use    Gilberto Better, PGY1  Pulmonary and Critical Care 12/02/2017, 12:23 PM  CC: Hoyt Koch, *

## 2017-12-02 NOTE — Assessment & Plan Note (Signed)
Sheila Morales presents with COPD previously managed with Anoro. Patient is having daily sx with 1-2 time daily rescue inhaler use. She is unable to continue to afford Anoro, we will try Spiriva and see if its cheaper  - Start Spiriva 34mcg 1 puff daily - We will try to get her insurance company formulary  - F/u in 3 months

## 2017-12-02 NOTE — Assessment & Plan Note (Addendum)
Sheila Morales presents after hospitalization for diarrhea complicated by presumed healthcare-associated pneumonia 2/2 pulmonary infiltrates on chest X-ray. Suspicion for pneumonia being cause of her infiltrate is low as all of her cultures turned out negative. It may have been caused by her Benlysta for Lupus.  - Follow up Chest X-ray today - Recommend she discuss with her rheumatologist about Benlysta side effect profile

## 2017-12-02 NOTE — Patient Instructions (Addendum)
Sheila Morales  Thank you for coming into the clinic. Here are the recommendations based on what we discussed:  - We will get Chest X-ray today - Start Spiriva 73mcg 1 capsule 2 puffs daily - Continue to use your Albuterol inhaler as needed for shortness of breath - Congratulations on reducing your cigarette use. Best thing to do is to quit altogether. - Please discuss with your rheumatologist about the side effects of your medication as they can cause infiltrates in your lungs as we saw during you recent hospital admission - Please follow up with [place] in 3 months

## 2017-12-02 NOTE — Assessment & Plan Note (Signed)
>  50 pack year smoker, used to smoke 2ppd. Currently down to 5-6 cigarettes per day.  - Recommend continuing to decrease her tobacco use

## 2017-12-05 ENCOUNTER — Telehealth: Payer: Self-pay | Admitting: Emergency Medicine

## 2017-12-05 NOTE — Telephone Encounter (Signed)
Please let her know that her chest x-ray is clear.  We will need to follow to make sure that she does not have recurrence when or if she gets back on Benlysta.

## 2017-12-05 NOTE — Telephone Encounter (Signed)
Advised pt of results. Pt understood and nothing further is needed.   

## 2017-12-05 NOTE — Telephone Encounter (Signed)
Called and spoke with patient, advised that I would get a message sent over to the doctor for CXR results. Patient verbalized understanding.    RB please advise, thank you.

## 2017-12-09 ENCOUNTER — Telehealth: Payer: Self-pay | Admitting: Emergency Medicine

## 2017-12-09 LAB — FUNGAL ORGANISM REFLEX

## 2017-12-09 LAB — FUNGUS CULTURE RESULT

## 2017-12-09 LAB — FUNGUS CULTURE WITH STAIN

## 2017-12-09 NOTE — Telephone Encounter (Signed)
Spoke with pt. States that she started [facility] but it is more expensive than Anoro. Reports that she is going to finish Spiriva since she paid for it but she will want to switch back to Anoro. Pt will call when she needs a new prescription. Nothing further was needed.

## 2017-12-15 ENCOUNTER — Ambulatory Visit (INDEPENDENT_AMBULATORY_CARE_PROVIDER_SITE_OTHER): Payer: Medicare Other | Admitting: Internal Medicine

## 2017-12-15 ENCOUNTER — Ambulatory Visit: Payer: Self-pay | Admitting: *Deleted

## 2017-12-15 ENCOUNTER — Encounter: Payer: Self-pay | Admitting: Internal Medicine

## 2017-12-15 DIAGNOSIS — H8113 Benign paroxysmal vertigo, bilateral: Secondary | ICD-10-CM

## 2017-12-15 NOTE — Patient Instructions (Signed)

## 2017-12-15 NOTE — Telephone Encounter (Signed)
Pt called with having dizziness off and on since July 20 the when she was discharged from the hospital. She was started on Protonix of July 3 rd. She denies nausea or vomiting, chest pain, ear pain or headache.  Sometimes she feels like the room is spinning, but mostly feeling woozy or weak when standing up.  Appointment scheduled. Advised to call back if symptoms get worse before coming into the appointment.  Pt voiced understanding.  Reason for Disposition . [1] MODERATE dizziness (e.g., vertigo; feels very unsteady, interferes with normal activities) AND [2] has NOT been evaluated by physician for this  Answer Assessment - Initial Assessment Questions 1. DESCRIPTION: "Describe your dizziness."     Room spinning 2. VERTIGO: "Do you feel like either you or the room is spinning or tilting?"      Room spinning 3. LIGHTHEADED: "Do you feel lightheaded?" (e.g., somewhat faint, woozy, weak upon standing)     Faint, woozing and feeling weak upon standing. 4. SEVERITY: "How bad is it?"  "Can you walk?"   - MILD - Feels unsteady but walking normally.   - MODERATE - Feels very unsteady when walking, but not falling; interferes with normal activities (e.g., school, work) .   - SEVERE - Unable to walk without falling (requires assistance).     Moderate once 5. ONSET:  "When did the dizziness begin?"     Started after July 20 th 6. AGGRAVATING FACTORS: "Does anything make it worse?" (e.g., standing, change in head position)     Sometimes changing head position and standing  7. CAUSE: "What do you think is causing the dizziness?"     no 8. RECURRENT SYMPTOM: "Have you had dizziness before?" If so, ask: "When was the last time?" "What happened that time?"     no 9. OTHER SYMPTOMS: "Do you have any other symptoms?" (e.g., headache, weakness, numbness, vomiting, earache)     Weakness and does not focus well  Protocols used: DIZZINESS - VERTIGO-A-AH

## 2017-12-15 NOTE — Telephone Encounter (Signed)
noted 

## 2017-12-15 NOTE — Progress Notes (Signed)
   Subjective:    Patient ID: Sheila Morales, female    DOB: February 08, 1951, 67 y.o.   MRN: [id number]  HPI The patient is a 67 YO female coming in for dizziness symptoms. The room feels like it is spinning. Going on since July 20th. Overall it is improving. She has tried nothing. Associated with no other symptoms. Denies sinus pressure or pain. Denies nose drainage. Denies fevers or chills. Denies SOB or cough or chest pains. Has hearing aids and has no problems with hearing. She sometimes has it with movement of her head and with bending and standing up suddenly. Lasts less than 10 minutes with episodes.   Review of Systems  Constitutional: Negative.   HENT: Negative.   Eyes: Negative.   Respiratory: Negative for cough, chest tightness and shortness of breath.   Cardiovascular: Negative for chest pain, palpitations and leg swelling.  Gastrointestinal: Negative for abdominal distention, abdominal pain, constipation, diarrhea, nausea and vomiting.  Musculoskeletal: Negative.   Skin: Negative.   Neurological: Positive for dizziness.  Psychiatric/Behavioral: Negative.       Objective:   Physical Exam  Constitutional: She is oriented to person, place, and time. She appears well-developed and well-nourished.  HENT:  Head: Normocephalic and atraumatic.  TMs normal, oropharynx normal.   Eyes: EOM are normal.  Neck: Normal range of motion.  Cardiovascular: Normal rate and regular rhythm.  Pulmonary/Chest: Effort normal and breath sounds normal. No respiratory distress. She has no wheezes. She has no rales.  Abdominal: Soft. Bowel sounds are normal. She exhibits no distension. There is no tenderness. There is no rebound.  Musculoskeletal: She exhibits no edema.  Neurological: She is alert and oriented to person, place, and time. Coordination normal.  Skin: Skin is warm and dry.  Psychiatric: She has a normal mood and affect.   Vitals:   12/15/17 1448  BP: 122/86  Pulse: 84  Temp: 98.4 F  (36.9 C)  TempSrc: Oral  SpO2: 99%  Weight: 131 lb (59.4 kg)  Height: 5\' 2"  (1.575 m)      Assessment & Plan:

## 2017-12-16 DIAGNOSIS — H8110 Benign paroxysmal vertigo, unspecified ear: Secondary | ICD-10-CM | POA: Insufficient documentation

## 2017-12-16 NOTE — Assessment & Plan Note (Addendum)
Given epley maneuver and encouraged to do these. If no improvement can refer to vestibular therapy. Not amenable to meclizine as episodes are so short. Suspect triggered by illness in July.

## 2017-12-18 ENCOUNTER — Other Ambulatory Visit: Payer: Self-pay | Admitting: Internal Medicine

## 2017-12-18 NOTE — Telephone Encounter (Signed)
Control database checked last refill:11/18/2017  LOV: 12/15/2017 acute CPE 02/24/17 NOV: none

## 2017-12-19 LAB — ACID FAST CULTURE WITH REFLEXED SENSITIVITIES: ACID FAST CULTURE - AFSCU3: NEGATIVE

## 2017-12-30 ENCOUNTER — Telehealth: Payer: Self-pay | Admitting: Emergency Medicine

## 2017-12-30 ENCOUNTER — Telehealth: Payer: Self-pay | Admitting: Physician Assistant

## 2017-12-30 MED ORDER — PANTOPRAZOLE SODIUM 40 MG PO TBEC: 40.0000 mg | DELAYED_RELEASE_TABLET | Freq: Two times a day (BID) | ORAL | 2 refills | Status: DC

## 2017-12-30 MED ORDER — UMECLIDINIUM-VILANTEROL 62.5-25 MCG/INH IN AEPB: 1.0000 | INHALATION_SPRAY | Freq: Every day | RESPIRATORY_TRACT | 3 refills | Status: DC

## 2017-12-30 NOTE — Telephone Encounter (Signed)
Called and spoke with Patient. She stated that Spiriva was to expensive for her and that she had tried Anoro before,and it worked better.  Anoro is more affordable for her.    Dr Lamonte Sakai, please advise

## 2017-12-30 NOTE — Telephone Encounter (Signed)
Pt requesting 90 day supply for Pantoprazole states it is a little bit cheaper for her. Rx sent to pharmacy. Pt informed.

## 2017-12-30 NOTE — Telephone Encounter (Signed)
Called and spoke with patient, advised that we have sent prescription to the pharmacy. Verified pharmacy. Nothing further needed.

## 2017-12-30 NOTE — Telephone Encounter (Signed)
Okay with me to try changing to [facility]

## 2017-12-31 DIAGNOSIS — M328 Other forms of systemic lupus erythematosus: Secondary | ICD-10-CM | POA: Diagnosis not present

## 2017-12-31 DIAGNOSIS — M2550 Pain in unspecified joint: Secondary | ICD-10-CM | POA: Diagnosis not present

## 2017-12-31 DIAGNOSIS — Z6823 Body mass index (BMI) 23.0-23.9, adult: Secondary | ICD-10-CM | POA: Diagnosis not present

## 2017-12-31 DIAGNOSIS — L932 Other local lupus erythematosus: Secondary | ICD-10-CM | POA: Diagnosis not present

## 2017-12-31 DIAGNOSIS — Z79899 Other long term (current) drug therapy: Secondary | ICD-10-CM | POA: Diagnosis not present

## 2018-01-19 DIAGNOSIS — M5134 Other intervertebral disc degeneration, thoracic region: Secondary | ICD-10-CM | POA: Diagnosis not present

## 2018-01-19 DIAGNOSIS — M5136 Other intervertebral disc degeneration, lumbar region: Secondary | ICD-10-CM | POA: Diagnosis not present

## 2018-01-19 DIAGNOSIS — M9903 Segmental and somatic dysfunction of lumbar region: Secondary | ICD-10-CM | POA: Diagnosis not present

## 2018-01-19 DIAGNOSIS — M9901 Segmental and somatic dysfunction of cervical region: Secondary | ICD-10-CM | POA: Diagnosis not present

## 2018-01-19 DIAGNOSIS — M9902 Segmental and somatic dysfunction of thoracic region: Secondary | ICD-10-CM | POA: Diagnosis not present

## 2018-01-19 DIAGNOSIS — M5032 Other cervical disc degeneration, mid-cervical region, unspecified level: Secondary | ICD-10-CM | POA: Diagnosis not present

## 2018-01-21 DIAGNOSIS — M9902 Segmental and somatic dysfunction of thoracic region: Secondary | ICD-10-CM | POA: Diagnosis not present

## 2018-01-21 DIAGNOSIS — M5134 Other intervertebral disc degeneration, thoracic region: Secondary | ICD-10-CM | POA: Diagnosis not present

## 2018-01-21 DIAGNOSIS — M5032 Other cervical disc degeneration, mid-cervical region, unspecified level: Secondary | ICD-10-CM | POA: Diagnosis not present

## 2018-01-21 DIAGNOSIS — M9903 Segmental and somatic dysfunction of lumbar region: Secondary | ICD-10-CM | POA: Diagnosis not present

## 2018-01-21 DIAGNOSIS — M5136 Other intervertebral disc degeneration, lumbar region: Secondary | ICD-10-CM | POA: Diagnosis not present

## 2018-01-21 DIAGNOSIS — M9901 Segmental and somatic dysfunction of cervical region: Secondary | ICD-10-CM | POA: Diagnosis not present

## 2018-01-26 ENCOUNTER — Ambulatory Visit: Payer: Medicare Other | Admitting: Neurology

## 2018-02-02 ENCOUNTER — Ambulatory Visit (INDEPENDENT_AMBULATORY_CARE_PROVIDER_SITE_OTHER): Payer: Medicare Other | Admitting: Neurology

## 2018-02-02 ENCOUNTER — Encounter: Payer: Self-pay | Admitting: Neurology

## 2018-02-02 VITALS — BP 110/80 | HR 101 | Ht 62.0 in | Wt 132.2 lb

## 2018-02-02 DIAGNOSIS — G8929 Other chronic pain: Secondary | ICD-10-CM | POA: Diagnosis not present

## 2018-02-02 DIAGNOSIS — G621 Alcoholic polyneuropathy: Secondary | ICD-10-CM | POA: Diagnosis not present

## 2018-02-02 DIAGNOSIS — M545 Low back pain, unspecified: Secondary | ICD-10-CM

## 2018-02-02 NOTE — Patient Instructions (Signed)
Return to clinic in 1 year.

## 2018-02-02 NOTE — Progress Notes (Signed)
Follow-up Visit   Date: 02/02/18    Sheila Morales MRN: [id number] DOB: [birth year]   Interim History: Sheila Morales is a 67 y.o. 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  The patient was accompanied to the clinic by self.  History of present illness: Starting in 2012, she developing tingling sensation of the feet which was restricted to below the ankles for the first several years. In 2016, it started progressing up her legs, to the level of the knees. Tingling is worse at night and keeps her up from sleeping. She does not paresthesias involving the hands. Denies any problems with balance or any falls this year. She has not tried any medication or seen a neurologist previously.   She drinks 2 glasses of wine nightly for the past two year and can drink more on the weekend. No history of diabetes or thyroid disease. Her lupus was well controlled for 20+ years, but this year she began have flares and skin lesions.  UPDATE 06/13/2016:  She is here for annual follow-up appointment.  She reports that her pain is well controlled on gabapentin 600mg  TID, but sometimes can be worse in the evening.    UPDATE 02/02/2018:  She is here for 1 year follow-up visit.  Overall, she is doing well from a neurology stand point.  Low back pain is well controlled and she sees a chiropractor for this.  Her painful neuropathy is stable on gabapentin 600mg -600mg -900mg . She continues to drink 1 glass of wine in the evening and more on the weekends, but is trying to quit week night consumption.  She does not have new complaints.   Medications:  Current Outpatient Medications on File Prior to Visit  Medication Sig Dispense Refill  . albuterol (PROVENTIL HFA;VENTOLIN HFA) 108 (90 Base) MCG/ACT inhaler Inhale 2 puffs into the lungs every 6 (six) hours as needed for wheezing or shortness of breath. 1  Inhaler 5  . AMBULATORY NON FORMULARY MEDICATION Medication Name: GI cocktail 5-10 mL every 4-6 hours as needed for pain 450 mL 1  . aspirin 81 MG tablet Take 81 mg by mouth daily.    . Belimumab (BENLYSTA) 200 MG/ML SOAJ Inject 1 mL into the skin every 30 (thirty) days. Pt goes to Dr Lenna Gilford to get infusion. 10-11-17    . cholecalciferol (VITAMIN D) 1000 UNITS tablet Take 1,000 Units by mouth daily.      . colestipol (COLESTID) 5 G granules 1 scoop alternating with 1/2 scoop 500 g 12  . gabapentin (NEURONTIN) 300 MG capsule Take 1 capsule (300 mg total) by mouth 3 (three) times daily. 2 po qam, 2 po afternoon, 3 po qhs (Patient taking differently: Take 600-900 mg by mouth See admin instructions. 600 mg every morning & afternoon, 900 mg at bedtime) 630 capsule 1  . glucosamine-chondroitin 500-400 MG tablet Take 2 tablets by mouth 2 (two) times daily.    Marland Kitchen guaiFENesin-dextromethorphan (ROBITUSSIN DM) 100-10 MG/5ML syrup Take 5 mLs by mouth every 4 (four) hours as needed for cough. 118 mL 0  . hydroxychloroquine (PLAQUENIL) 200 MG tablet Take 200 mg by mouth 2 (two) times daily.    . MULTIPLE VITAMIN PO Take 1 tablet by mouth daily.      . Omega-3 1000 MG CAPS Take 1,000 mg by mouth daily.    . pantoprazole (PROTONIX) 40 MG tablet Take 1 tablet (40 mg total)  by mouth 2 (two) times daily before a meal. 180 tablet 2  . RESTASIS 0.05 % ophthalmic emulsion INSTILL 1 DROP INTO BOTH EYES TWICE A DAY  6  . saccharomyces boulardii (FLORASTOR) 250 MG capsule Take 1 capsule (250 mg total) by mouth 2 (two) times daily. 60 capsule 1  . triamcinolone cream (KENALOG) 0.1 % APPLY TO AFFECTED AREA TWICE A DAY FOR 2 WEEK AS DIRECTED  0  . umeclidinium-vilanterol (ANORO ELLIPTA) 62.5-25 MCG/INH AEPB Inhale 1 puff into the lungs daily. 1 each 3  . vitamin C (ASCORBIC ACID) 500 MG tablet Take 500 mg by mouth daily.      . vitamin E 1000 UNIT capsule Take 1,000 Units by mouth daily.    Marland Kitchen zolpidem (AMBIEN) 5 MG tablet TAKE  1 TABLET (5 MG TOTAL) BY MOUTH AT BEDTIME AS NEEDED. 30 tablet 4   No current facility-administered medications on file prior to visit.     Allergies: No Known Allergies  Review of Systems:  CONSTITUTIONAL: No fevers, chills, night sweats, or weight loss.  EYES: No visual changes or eye pain ENT: No hearing changes.  No history of nose bleeds.   RESPIRATORY: No cough, wheezing and shortness of breath.   CARDIOVASCULAR: Negative for chest pain, and palpitations.   GI: Negative for abdominal discomfort, blood in stools or black stools.  No recent change in bowel habits.   GU:  No history of incontinence.   MUSCLOSKELETAL: No history of joint pain or swelling.  No myalgias.   SKIN: Negative for lesions, rash, and itching.   ENDOCRINE: Negative for cold or heat intolerance, polydipsia or goiter.   PSYCH:  No depression or anxiety symptoms.   NEURO: As Above.   Vital Signs:  BP 110/80   Pulse (!) 101   Ht 5\' 2"  (1.575 m)   Wt 132 lb 4 oz (60 kg)   SpO2 95%   BMI 24.19 kg/m   General Medical Exam:   General:  Well appearing, comfortable  Eyes/ENT: see cranial nerve examination.   Neck: No masses appreciated.  Full range of motion without tenderness.  No carotid bruits. Respiratory:  Clear to auscultation, good air entry bilaterally.   Cardiac:  Regular rate and rhythm, no murmur.   Ext:  No edema  Neurological Exam: MENTAL STATUS including orientation to time, place, person, recent and remote memory, attention span and concentration, language, and fund of knowledge is normal.  Speech is not dysarthric.  CRANIAL NERVES:   Face is symmetric.   MOTOR:  Motor strength is 5/5 in all extremities  MSRs:  Reflexes are 2+/4 throughout.  SENSORY:  Intact to vibration throughout.  COORDINATION/GAIT:   Gait narrow based and stable.     Data: NCS/EMG of the lower extremities 03/07/2015:  This is a normal study. In particular, there is no evidence of a generalized sensorimotor  polyneuropathy or left lumbosacral radiculopathy.  MRI Lumbar spine wwo contrast 12/27/2008: 1. Stable appearance of the marrow in the lumbar spine compared with prior study from 06/03/2008. Findings may reflect a normal variant, although myelofibrosis remains possible. Correlation with complete blood count is recommended. 2. No evidence of pathologic fracture or paraspinal abnormality. 3. No disc herniation, spinal stenosis or nerve root encroachment.  MRI lumbar spine wo conrast 03/18/2015:  Heterogeneous marrow signal is unchanged in appearance since 2010.  Mild spondylosis without central canal or foraminal narrowing.  Labs 02/20/2015:  Vitamin B1 12, copper 94, zinc 59*, SPEP with IFE no M protein  IMPRESSION: 1.  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  Well-controlled on gabapentin 600mg  in the morning and 900mg  at bedtime.  She continues to drinks 1 glass of wine nightly and is planning on reducing this to weekends only.  2.  Chronic low back pain, stable.  Return to clinic in 1 year   Thank you for allowing me to participate in patient's care.  If I can answer any additional questions, I would be pleased to do so.    Sincerely,    Abu Heavin K. Posey Pronto, DO

## 2018-02-05 ENCOUNTER — Encounter: Payer: Self-pay | Admitting: Emergency Medicine

## 2018-02-05 ENCOUNTER — Ambulatory Visit (INDEPENDENT_AMBULATORY_CARE_PROVIDER_SITE_OTHER): Payer: Medicare Other | Admitting: Emergency Medicine

## 2018-02-05 DIAGNOSIS — Z23 Encounter for immunization: Secondary | ICD-10-CM | POA: Diagnosis not present

## 2018-02-05 DIAGNOSIS — F17200 Nicotine dependence, unspecified, uncomplicated: Secondary | ICD-10-CM | POA: Diagnosis not present

## 2018-02-05 DIAGNOSIS — J449 Chronic obstructive pulmonary disease, unspecified: Secondary | ICD-10-CM | POA: Diagnosis not present

## 2018-02-05 DIAGNOSIS — J189 Pneumonia, unspecified organism: Secondary | ICD-10-CM | POA: Insufficient documentation

## 2018-02-05 NOTE — Progress Notes (Signed)
Subjective:    Patient ID: Sheila Morales, female    DOB: 03-13-1951, 67 y.o.   MRN: [id number]  COPD  She complains of shortness of breath. There is no cough or wheezing. Pertinent negatives include no appetite change, ear pain, fever, headaches, postnasal drip, rhinorrhea, sneezing, sore throat or trouble swallowing. Her past medical history is significant for COPD.  Shortness of Breath  Associated symptoms include a rash. Pertinent negatives include no ear pain, fever, headaches, leg swelling, rhinorrhea, sore throat, vomiting or wheezing. Her past medical history is significant for COPD.     ROV 05/13/17 --67 year old smoker with severe obstructive lung disease identified by pulmonary function testing.  Also with a history of lupus, reflux, IBS. She started biologic for her SLE w Dr Trudie Reed.  At her last visit we tried starting Anoro to see if she would benefit.  She also tried taking Chantix to assist with smoking cessation.  Unfortunately she had GI side effects with the Chantix and had to stop it after about 3 weeks.  She is currently smoking approximately 1 pk a day. She believes that she is benefiting from the Anoro - breathing a bit better. She has some cough, non-prod.  She is still working.   ROV 07/29/17 --this is a follow-up visit for patient with a history of tobacco use and associated severe obstructive lung disease.  She also has SLE, esophageal reflux and irritable bowel syndrome.  She is followed by Dr. Trudie Reed and is being managed on belimumab (Benlysta), Plaquenil, Colestid. We have been managing her w Anoro. She has a hard time telling whether it is helping her much. She uses albuterol occasionally, needed it when she worked in the yard recently.  No pred since last time. She is smoking about 15 cig a day.   She has been dealing with an abscessed tooth, root canal.   ROV 02/05/18 --67 year old smoker whom we follow for severe COPD.  She also has lupus and inflammatory bowel disease  on immunosuppressive therapy (Plaquenil, previously Benlysta).  We performed bronchoscopy in July 2019 to evaluate some bilateral upper lobe groundglass change.  Culture data was all negative.  Infiltrates cleared on subsequent imaging, question RB-ILD or pneumonitis related to immunosuppression.  At her last visit we changed Anoro to Spiriva for cost reasons.  We tried changing her Anoro to spiriva - she preferred the Anoro and she is back on it. No flares since last time. She uses albuterol 1-2x a week. She is smoking about 10 a day. She is planning to begin decreasing systematically. She needs her flu shot. Her PNA shots are UTD.    Review of Systems  Constitutional: Negative for appetite change, fever and unexpected weight change.  HENT: Negative for congestion, dental problem, ear pain, nosebleeds, postnasal drip, rhinorrhea, sinus pressure, sneezing, sore throat and trouble swallowing.   Eyes: Negative for redness and itching.  Respiratory: Positive for shortness of breath. Negative for cough, chest tightness and wheezing.   Cardiovascular: Negative for palpitations and leg swelling.  Gastrointestinal: Negative for nausea and vomiting.  Genitourinary: Negative for dysuria.  Musculoskeletal: Negative for joint swelling.  Skin: Positive for rash.  Neurological: Negative for headaches.  Hematological: Does not bruise/bleed easily.  Psychiatric/Behavioral: Negative for dysphoric mood. The patient is not nervous/anxious.     Past Medical History:  Diagnosis Date  . Arthritis   . Colitis, ischemic ([place])   . External hemorrhoids   . H/O: rheumatic fever   . IBS (irritable bowel  syndrome)   . Personal history colonic adenoma 03/25/2008   06/2007 right sided adenoma and 2 right hyperplastic polyps    . Restless leg syndrome    questionable  . Systemic lupus erythematosus ([place])   . Tubular adenoma of colon   . 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 Stroke Mother        Deceased, 3s  . Atrial fibrillation Father   . Colon cancer Father   . Other Sister        Pick's disease, deceased  . Healthy Daughter      Social History   Socioeconomic History  . Marital status: Married    Spouse name: Not on file  . Number of children: 2  . Years of education: 74  . Highest education level: Not on file  Occupational History  . Occupation: Education administrator business  . Occupation: caregiver  Social Needs  . Financial resource strain: Not on file  . Food insecurity:    Worry: Not on file    Inability: Not on file  . Transportation needs:    Medical: Not on file    Non-medical: Not on file  Tobacco Use  . Smoking status: Current Every Day Smoker    Packs/day: 0.25    Years: 30.00    Pack years: 7.50    Types: Cigarettes  . Smokeless tobacco: Never Used  . Tobacco comment: Declined info  Substance and Sexual Activity  . Alcohol use: Yes    Alcohol/week: 5.0 standard drinks    Types: 5 Standard drinks or equivalent per week    Comment: 2 glasses of wine nightly, 4 beers nightly on weekend  . Drug use: No  . Sexual activity: Yes  Lifestyle  . Physical activity:    Days per week: Not on file    Minutes per session: Not on file  . Stress: Not on file  Relationships  . Social connections:    Talks on phone: Not on file    Gets together: Not on file    Attends religious service: Not on file    Active member of club or organization: Not on file    Attends meetings of clubs or organizations: Not on file    Relationship status: Not on file  . Intimate partner violence:    Fear of current or ex partner: Not on file    Emotionally abused: Not on file    Physically abused: Not on file    Forced sexual activity: Not on file  Other Topics Concern  . Not on file  Social History Narrative   HSG. Married '71. 1 daughter- '74, '78; 2 grand daughters.Work: Armed forces operational officer. Lives with husband and mother-in-law is moving in  after rehab.      formerly worked Architect, Museum/gallery conservator, currently does home care, house clearing    No Known Allergies   Outpatient Medications Prior to Visit  Medication Sig Dispense Refill  . albuterol (PROVENTIL HFA;VENTOLIN HFA) 108 (90 Base) MCG/ACT inhaler Inhale 2 puffs into the lungs every 6 (six) hours as needed for wheezing or shortness of breath. 1 Inhaler 5  . AMBULATORY NON FORMULARY MEDICATION Medication Name: GI cocktail 5-10 mL every 4-6 hours as needed for pain 450 mL 1  . aspirin 81 MG tablet Take 81 mg by mouth daily.    . cholecalciferol (VITAMIN D) 1000 UNITS tablet Take 1,000 Units by mouth daily.      Marland Kitchen  colestipol (COLESTID) 5 G granules 1 scoop alternating with 1/2 scoop 500 g 12  . gabapentin (NEURONTIN) 300 MG capsule Take 1 capsule (300 mg total) by mouth 3 (three) times daily. 2 po qam, 2 po afternoon, 3 po qhs (Patient taking differently: Take 600-900 mg by mouth See admin instructions. 600 mg every morning & afternoon, 900 mg at bedtime) 630 capsule 1  . glucosamine-chondroitin 500-400 MG tablet Take 2 tablets by mouth 2 (two) times daily.    Marland Kitchen guaiFENesin-dextromethorphan (ROBITUSSIN DM) 100-10 MG/5ML syrup Take 5 mLs by mouth every 4 (four) hours as needed for cough. 118 mL 0  . hydroxychloroquine (PLAQUENIL) 200 MG tablet Take 200 mg by mouth 2 (two) times daily.    . MULTIPLE VITAMIN PO Take 1 tablet by mouth daily.      . Omega-3 1000 MG CAPS Take 1,000 mg by mouth daily.    . pantoprazole (PROTONIX) 40 MG tablet Take 1 tablet (40 mg total) by mouth 2 (two) times daily before a meal. 180 tablet 2  . RESTASIS 0.05 % ophthalmic emulsion INSTILL 1 DROP INTO BOTH EYES TWICE A DAY  6  . saccharomyces boulardii (FLORASTOR) 250 MG capsule Take 1 capsule (250 mg total) by mouth 2 (two) times daily. 60 capsule 1  . triamcinolone cream (KENALOG) 0.1 % APPLY TO AFFECTED AREA TWICE A DAY FOR 2 WEEK AS DIRECTED  0  . umeclidinium-vilanterol (ANORO ELLIPTA)  62.5-25 MCG/INH AEPB Inhale 1 puff into the lungs daily. 1 each 3  . vitamin C (ASCORBIC ACID) 500 MG tablet Take 500 mg by mouth daily.      . vitamin E 1000 UNIT capsule Take 1,000 Units by mouth daily.    Marland Kitchen zolpidem (AMBIEN) 5 MG tablet TAKE 1 TABLET (5 MG TOTAL) BY MOUTH AT BEDTIME AS NEEDED. 30 tablet 4  . Belimumab (BENLYSTA) 200 MG/ML SOAJ Inject 1 mL into the skin every 30 (thirty) days. Pt goes to Dr Lenna Gilford to get infusion. 10-11-17     No facility-administered medications prior to visit.         Objective:   Physical Exam Vitals:   02/05/18 1346  BP: 128/70  Pulse: 88  SpO2: 93%  Weight: 60.1 kg  Height: 5\' 2"  (1.575 m)   Gen: Pleasant, well-nourished, in no distress,  normal affect  ENT: No lesions,  mouth clear,  oropharynx clear, no postnasal drip  Neck: No JVD, no Stridor  Lungs: No use of accessory muscles,  clear without rales or rhonchi  Cardiovascular: RRR, heart sounds normal, no murmur or gallops, no peripheral edema  Musculoskeletal: No deformities, no cyanosis or clubbing  Neuro: alert, non focal  Skin: no rash     Assessment & Plan:  Pneumonitis History of bilateral infiltrates that are poorly understood, possibly pneumonitis versus pulmonary edema.  They did resolve quickly.  Her culture data from bronchoscopy was negative.  She is no longer on Benlysta, may be planning to restart a biologic.  We will need to follow her chest x-ray going forward.  COPD (chronic obstructive pulmonary disease) ([place]) Please continue Anoro once daily as you have been taking it. Keep your albuterol available to use 2 puffs up to every 4 hours if needed for shortness of breath, chest tightness, wheezing. Flu shot today Follow with Dr Lamonte Sakai in 6 months or sooner if you have any problems  TOBACCO USE DISORDER/SMOKER-SMOKING CESSATION DISCUSSED Discussed strategies for decreasing and ultimately cessation today  Baltazar Apo, MD, PhD 02/05/2018, 2:17  PM [place]  [facility] [phone] or if no answer [phone]

## 2018-02-05 NOTE — Patient Instructions (Signed)
Please continue Anoro once daily as you have been taking it. Keep your albuterol available to use 2 puffs up to every 4 hours if needed for shortness of breath, chest tightness, wheezing. We will follow your chest x-ray going forward to ensure no recurrence of lung inflammation.  We will do this especially if you go back on immunosuppressive medications for your lupus. Flu shot today Please continue to work hard on decreasing your smoking. Follow with Dr Lamonte Sakai in 6 months or sooner if you have any problems

## 2018-02-05 NOTE — Assessment & Plan Note (Signed)
History of bilateral infiltrates that are poorly understood, possibly pneumonitis versus pulmonary edema.  They did resolve quickly.  Her culture data from bronchoscopy was negative.  She is no longer on Benlysta, may be planning to restart a biologic.  We will need to follow her chest x-ray going forward.

## 2018-02-05 NOTE — Assessment & Plan Note (Signed)
Discussed strategies for decreasing and ultimately cessation today

## 2018-02-05 NOTE — Assessment & Plan Note (Signed)
Please continue Anoro once daily as you have been taking it. Keep your albuterol available to use 2 puffs up to every 4 hours if needed for shortness of breath, chest tightness, wheezing. Flu shot today Follow with Dr Lamonte Sakai in 6 months or sooner if you have any problems

## 2018-02-18 DIAGNOSIS — M9902 Segmental and somatic dysfunction of thoracic region: Secondary | ICD-10-CM | POA: Diagnosis not present

## 2018-02-18 DIAGNOSIS — M5136 Other intervertebral disc degeneration, lumbar region: Secondary | ICD-10-CM | POA: Diagnosis not present

## 2018-02-18 DIAGNOSIS — M5134 Other intervertebral disc degeneration, thoracic region: Secondary | ICD-10-CM | POA: Diagnosis not present

## 2018-02-18 DIAGNOSIS — M9903 Segmental and somatic dysfunction of lumbar region: Secondary | ICD-10-CM | POA: Diagnosis not present

## 2018-02-18 DIAGNOSIS — M5032 Other cervical disc degeneration, mid-cervical region, unspecified level: Secondary | ICD-10-CM | POA: Diagnosis not present

## 2018-02-18 DIAGNOSIS — M9901 Segmental and somatic dysfunction of cervical region: Secondary | ICD-10-CM | POA: Diagnosis not present

## 2018-02-19 DIAGNOSIS — H04123 Dry eye syndrome of bilateral lacrimal glands: Secondary | ICD-10-CM | POA: Diagnosis not present

## 2018-02-19 DIAGNOSIS — M329 Systemic lupus erythematosus, unspecified: Secondary | ICD-10-CM | POA: Diagnosis not present

## 2018-02-19 DIAGNOSIS — H01003 Unspecified blepharitis right eye, unspecified eyelid: Secondary | ICD-10-CM | POA: Diagnosis not present

## 2018-02-19 DIAGNOSIS — Z79899 Other long term (current) drug therapy: Secondary | ICD-10-CM | POA: Diagnosis not present

## 2018-02-27 ENCOUNTER — Ambulatory Visit (INDEPENDENT_AMBULATORY_CARE_PROVIDER_SITE_OTHER): Payer: Medicare Other | Admitting: Internal Medicine

## 2018-02-27 ENCOUNTER — Encounter: Payer: Self-pay | Admitting: Internal Medicine

## 2018-02-27 DIAGNOSIS — J449 Chronic obstructive pulmonary disease, unspecified: Secondary | ICD-10-CM

## 2018-02-27 MED ORDER — PROMETHAZINE-DM 6.25-15 MG/5ML PO SYRP: 5.0000 mL | ORAL_SOLUTION | Freq: Every evening | ORAL | 0 refills | Status: DC | PRN

## 2018-02-27 NOTE — Assessment & Plan Note (Signed)
No flare today, continue albuterol prn and rx for promethazine/dm cough medicine to use. No indication for steroids or antibiotics.

## 2018-02-27 NOTE — Patient Instructions (Signed)
We have sent in cough medicine to use in the evening for coughing.   You do not need an antibiotic today.

## 2018-02-27 NOTE — Progress Notes (Signed)
   Subjective:    Patient ID: Sheila Morales, female    DOB: 1950/11/06, 67 y.o.   MRN: [id number]  HPI The patient is a 67 YO female coming in for cough ongoing for about 1 week now. She started with nasal congestion and cough. She denies sinus pressure or pain. Denies ear pressure or pain. Denies headaches or migraines. Denies SOB outside of her normal SOB. She is overall improving gradually. She is not able to sleep due to cough and was hoping to get something to help. She has tried otc cough and cold medicine without relief.   Review of Systems  Constitutional: Positive for activity change. Negative for appetite change, chills, fatigue, fever and unexpected weight change.  HENT: Positive for congestion, postnasal drip and rhinorrhea. Negative for ear discharge, ear pain, sinus pressure, sinus pain, sneezing, sore throat, tinnitus, trouble swallowing and voice change.   Eyes: Negative.   Respiratory: Positive for cough and shortness of breath. Negative for chest tightness and wheezing.   Cardiovascular: Negative.   Gastrointestinal: Negative.   Musculoskeletal: Negative.   Neurological: Negative.       Objective:   Physical Exam  Constitutional: She is oriented to person, place, and time. She appears well-developed and well-nourished.  HENT:  Head: Normocephalic and atraumatic.  Oropharynx with redness and clear drainage, nose with swollen turbinates, TMs normal bilaterally  Eyes: EOM are normal.  Neck: Normal range of motion. No thyromegaly present.  Cardiovascular: Normal rate and regular rhythm.  Pulmonary/Chest: Effort normal and breath sounds normal. No respiratory distress. She has no wheezes. She has no rales.  Abdominal: Soft.  Musculoskeletal: She exhibits tenderness.  Lymphadenopathy:    She has no cervical adenopathy.  Neurological: She is alert and oriented to person, place, and time.  Skin: Skin is warm and dry.   Vitals:   02/27/18 1446  BP: 136/72  Pulse: 87    Temp: 98.3 F (36.8 C)  TempSrc: Oral  SpO2: 95%  Weight: 134 lb (60.8 kg)  Height: 5\' 2"  (1.575 m)      Assessment & Plan:

## 2018-03-18 DIAGNOSIS — M5134 Other intervertebral disc degeneration, thoracic region: Secondary | ICD-10-CM | POA: Diagnosis not present

## 2018-03-18 DIAGNOSIS — M5032 Other cervical disc degeneration, mid-cervical region, unspecified level: Secondary | ICD-10-CM | POA: Diagnosis not present

## 2018-03-18 DIAGNOSIS — M5136 Other intervertebral disc degeneration, lumbar region: Secondary | ICD-10-CM | POA: Diagnosis not present

## 2018-03-18 DIAGNOSIS — M9902 Segmental and somatic dysfunction of thoracic region: Secondary | ICD-10-CM | POA: Diagnosis not present

## 2018-03-18 DIAGNOSIS — M9901 Segmental and somatic dysfunction of cervical region: Secondary | ICD-10-CM | POA: Diagnosis not present

## 2018-03-18 DIAGNOSIS — M9903 Segmental and somatic dysfunction of lumbar region: Secondary | ICD-10-CM | POA: Diagnosis not present

## 2018-04-03 ENCOUNTER — Telehealth: Payer: Self-pay | Admitting: Neurology

## 2018-04-03 NOTE — Telephone Encounter (Signed)
Called the pharmacist and let them know that the directions in the last OV note states 2 po am, 2 po afternoon and 3 po qhs.

## 2018-04-03 NOTE — Telephone Encounter (Signed)
[facility] called needing clarification on gabapentin 300mg  directions. 1 pill 3xs a day or 2am,2pm and 2 at bedtime?  Please call them back at [phone]. Thanks!

## 2018-04-08 DIAGNOSIS — R5383 Other fatigue: Secondary | ICD-10-CM | POA: Diagnosis not present

## 2018-04-08 DIAGNOSIS — M328 Other forms of systemic lupus erythematosus: Secondary | ICD-10-CM | POA: Diagnosis not present

## 2018-04-08 DIAGNOSIS — M2550 Pain in unspecified joint: Secondary | ICD-10-CM | POA: Diagnosis not present

## 2018-04-08 DIAGNOSIS — Z6824 Body mass index (BMI) 24.0-24.9, adult: Secondary | ICD-10-CM | POA: Diagnosis not present

## 2018-04-08 DIAGNOSIS — Z79899 Other long term (current) drug therapy: Secondary | ICD-10-CM | POA: Diagnosis not present

## 2018-04-08 DIAGNOSIS — L932 Other local lupus erythematosus: Secondary | ICD-10-CM | POA: Diagnosis not present

## 2018-04-27 ENCOUNTER — Ambulatory Visit (INDEPENDENT_AMBULATORY_CARE_PROVIDER_SITE_OTHER): Payer: PPO | Admitting: Internal Medicine

## 2018-04-27 ENCOUNTER — Encounter: Payer: Self-pay | Admitting: Internal Medicine

## 2018-04-27 DIAGNOSIS — J440 Chronic obstructive pulmonary disease with acute lower respiratory infection: Secondary | ICD-10-CM

## 2018-04-27 DIAGNOSIS — H903 Sensorineural hearing loss, bilateral: Secondary | ICD-10-CM | POA: Diagnosis not present

## 2018-04-27 MED ORDER — METHYLPREDNISOLONE ACETATE 40 MG/ML IJ SUSP
40.0000 mg | Freq: Once | INTRAMUSCULAR | Status: AC
  Administered 2018-04-27: 40 mg via INTRAMUSCULAR

## 2018-04-27 NOTE — Assessment & Plan Note (Signed)
Given depo-medrol 40 mg IM. Likely viral etiology so no antibiotics today. Call back in 4-5 days if no improvement or sooner with worsening. Albuterol prn and continue anoro daily.

## 2018-04-27 NOTE — Patient Instructions (Signed)
We have given you the steroid shot today.  If you are not getting better end of week or next week call back or if worsening call back.

## 2018-04-27 NOTE — Progress Notes (Signed)
   Subjective:   Patient ID: Sheila Morales, female    DOB: 05/26/1950, 68 y.o.   MRN: [id number]  HPI The patient is a 68 y.o. female coming in for cold symptoms. Started 3 days ago. Main symptoms are: cough, SOB, sinus pressure and drainage. Denies fevers or chills, denies wheezing. Overall it is stable to mild worsening. Has tried cough medicine and cold medications over the counter which have helped some. Is taking her anoro daily and using albuterol inhaler more often than usual. Still smoking but not as much.    Review of Systems  Constitutional: Positive for activity change, appetite change and fatigue. Negative for chills, fever and unexpected weight change.  HENT: Positive for congestion, postnasal drip, rhinorrhea and sinus pressure. Negative for ear discharge, ear pain, sinus pain, sneezing, sore throat, tinnitus, trouble swallowing and voice change.   Eyes: Negative.   Respiratory: Positive for cough and shortness of breath. Negative for chest tightness and wheezing.   Cardiovascular: Negative.   Gastrointestinal: Negative.   Musculoskeletal: Positive for myalgias.  Neurological: Negative.     Objective:  Physical Exam Constitutional:      Appearance: She is well-developed.  HENT:     Head: Normocephalic and atraumatic.     Comments: Oropharynx with redness and clear drainage, nose with swollen turbinates, TMs normal bilaterally.  Neck:     Musculoskeletal: Normal range of motion.     Thyroid: No thyromegaly.  Cardiovascular:     Rate and Rhythm: Normal rate and regular rhythm.  Pulmonary:     Effort: Pulmonary effort is normal. No respiratory distress.     Breath sounds: Rhonchi present. No wheezing or rales.  Abdominal:     Palpations: Abdomen is soft.  Musculoskeletal:        General: Tenderness present.  Lymphadenopathy:     Cervical: No cervical adenopathy.  Skin:    General: Skin is warm and dry.  Neurological:     Mental Status: She is alert and oriented to  person, place, and time.     Vitals:   04/27/18 1036  BP: (!) 150/80  Pulse: 95  Temp: 98.2 F (36.8 C)  TempSrc: Oral  SpO2: 95%  Weight: 134 lb (60.8 kg)  Height: 5\' 2"  (1.575 m)    Assessment & Plan:  Depo-medrol 40 mg IM given at visit

## 2018-04-28 ENCOUNTER — Other Ambulatory Visit: Payer: Self-pay | Admitting: Internal Medicine

## 2018-04-28 ENCOUNTER — Ambulatory Visit: Payer: Self-pay

## 2018-04-28 NOTE — Telephone Encounter (Signed)
We can send in rx for prednisone for her but antibiotics are not indicated as this is likely viral disease and she is only 4 days into symptoms. Generally with viral diseases symptoms are worst around day 4-5 and if SOB or problems breathing should go to urgent care or ER.

## 2018-04-28 NOTE — Telephone Encounter (Signed)
Called patient she does not want Prednisone as she is already on it, stated understanding to MD response will cancel appointment for tomorrow and will call back if not getting better.

## 2018-04-28 NOTE — Telephone Encounter (Signed)
Pt called stating that her symptoms have worsened since her appointment yesterday.  She has been unable to sleep.  She has a congested cough, runny nose, and scratchy throat.  She is unable to wear her hearing aids today because of the congestion in her ears. She does not have fever. She is SOB. She rates it as mild stating she is SOB at just after lying down but it improves as she relaxes. She has COPD.  Pt was scheduled for tomorrow AM. Protocol states she should be seen today. Pt will go to urgent care if symptoms continue to worsen. Pt is requesting an antibiotic. Care advice read to patient.  Pt verbalized understanding.  Reason for Disposition . [1] Longstanding difficulty breathing AND [2] not responding to usual therapy  Answer Assessment - Initial Assessment Questions 1. RESPIRATORY STATUS: "Describe your breathing?" (e.g., wheezing, shortness of breath, unable to speak, severe coughing)      Wheezing,SOB, difficult to talk 2. ONSET: "When did this breathing problem begin?"      Yesterday seen in office 3. PATTERN "Does the difficult breathing come and go, or has it been constant since it started?"      constant 4. SEVERITY: "How bad is your breathing?" (e.g., mild, moderate, severe)    - MILD: No SOB at rest, mild SOB with walking, speaks normally in sentences, can lay down, no retractions, pulse < 100.    - MODERATE: SOB at rest, SOB with minimal exertion and prefers to sit, cannot lie down flat, speaks in phrases, mild retractions, audible wheezing, pulse 100-120.    - SEVERE: Very SOB at rest, speaks in single words, struggling to breathe, sitting hunched forward, retractions, pulse > 120      Mild She is SOB when she first lies down but as she relaxes she is fine 5. RECURRENT SYMPTOM: "Have you had difficulty breathing before?" If so, ask: "When was the last time?" and "What happened that time?"  Yes because COPD but not this bad 6. CARDIAC HISTORY: "Do you have any history of heart  disease?" (e.g., heart attack, angina, bypass surgery, angioplasty)      no 7. LUNG HISTORY: "Do you have any history of lung disease?"  (e.g., pulmonary embolus, asthma, emphysema)     COPD 8. CAUSE: "What do you think is causing the breathing problem?"      Cough and congestion 9. OTHER SYMPTOMS: "Do you have any other symptoms? (e.g., dizziness, runny nose, cough, chest pain, fever)     Runny nose, scratchy throat,cough, head feels stuffed 10. PREGNANCY: "Is there any chance you are pregnant?" "When was your last menstrual period?"       N/A 11. TRAVEL: "Have you traveled out of the country in the last month?" (e.g., travel history, exposures)       No  Protocols used: BREATHING DIFFICULTY-A-AH

## 2018-04-29 ENCOUNTER — Ambulatory Visit: Payer: PPO | Admitting: Nurse Practitioner

## 2018-04-30 DIAGNOSIS — H25011 Cortical age-related cataract, right eye: Secondary | ICD-10-CM | POA: Diagnosis not present

## 2018-04-30 DIAGNOSIS — H31011 Macula scars of posterior pole (postinflammatory) (post-traumatic), right eye: Secondary | ICD-10-CM | POA: Diagnosis not present

## 2018-04-30 DIAGNOSIS — H25043 Posterior subcapsular polar age-related cataract, bilateral: Secondary | ICD-10-CM | POA: Diagnosis not present

## 2018-04-30 DIAGNOSIS — H2511 Age-related nuclear cataract, right eye: Secondary | ICD-10-CM | POA: Diagnosis not present

## 2018-04-30 DIAGNOSIS — H25041 Posterior subcapsular polar age-related cataract, right eye: Secondary | ICD-10-CM | POA: Diagnosis not present

## 2018-04-30 DIAGNOSIS — H25013 Cortical age-related cataract, bilateral: Secondary | ICD-10-CM | POA: Diagnosis not present

## 2018-04-30 DIAGNOSIS — H2513 Age-related nuclear cataract, bilateral: Secondary | ICD-10-CM | POA: Diagnosis not present

## 2018-04-30 DIAGNOSIS — Z79899 Other long term (current) drug therapy: Secondary | ICD-10-CM | POA: Diagnosis not present

## 2018-05-04 ENCOUNTER — Ambulatory Visit: Payer: Self-pay

## 2018-05-04 NOTE — Telephone Encounter (Signed)
Patient informed of MD response and stated understanding  

## 2018-05-04 NOTE — Telephone Encounter (Signed)
Should take just at night time as it can make you drowsy.

## 2018-05-04 NOTE — Telephone Encounter (Signed)
Pt with COPD and URI wanting to know if she can take her prescribed cough medicine during the day and at night. Medication is :promethazine-dextromethorphan (PROMETHAZINE-DM) 6.25-15 MG/5ML syrup. Pt advised to increase fluid intake, drink warm fluids (tea,soup), buy a humidifier. Pt advised to take 2 teaspoons of honey at bedtime to help with cough. Advised pt that NT cannot advise her to take her cough syrup other that the way it is prescribed. Advised pt not to completely suppress her cough so she can cough out the phlegm.  Pt informed that note will be routed to her PCP office.    Reason for Disposition . Pharmacy calling with prescription questions and triager unable to answer question  Answer Assessment - Initial Assessment Questions 1. SYMPTOMS: "Do you have any symptoms?"   Pt with COPD and frequent coughing wanting to take cough medicine twice per day.  2. SEVERITY: If symptoms are present, ask "Are they mild, moderate or severe?"     moderate  Protocols used: MEDICATION QUESTION CALL-A-AH

## 2018-05-11 ENCOUNTER — Ambulatory Visit: Payer: PPO | Admitting: Internal Medicine

## 2018-05-12 DIAGNOSIS — M9901 Segmental and somatic dysfunction of cervical region: Secondary | ICD-10-CM | POA: Diagnosis not present

## 2018-05-12 DIAGNOSIS — M9902 Segmental and somatic dysfunction of thoracic region: Secondary | ICD-10-CM | POA: Diagnosis not present

## 2018-05-12 DIAGNOSIS — M5136 Other intervertebral disc degeneration, lumbar region: Secondary | ICD-10-CM | POA: Diagnosis not present

## 2018-05-12 DIAGNOSIS — M5032 Other cervical disc degeneration, mid-cervical region, unspecified level: Secondary | ICD-10-CM | POA: Diagnosis not present

## 2018-05-12 DIAGNOSIS — M9903 Segmental and somatic dysfunction of lumbar region: Secondary | ICD-10-CM | POA: Diagnosis not present

## 2018-05-12 DIAGNOSIS — M5134 Other intervertebral disc degeneration, thoracic region: Secondary | ICD-10-CM | POA: Diagnosis not present

## 2018-05-13 DIAGNOSIS — H04123 Dry eye syndrome of bilateral lacrimal glands: Secondary | ICD-10-CM | POA: Diagnosis not present

## 2018-05-13 DIAGNOSIS — H16223 Keratoconjunctivitis sicca, not specified as Sjogren's, bilateral: Secondary | ICD-10-CM | POA: Diagnosis not present

## 2018-05-14 DIAGNOSIS — M328 Other forms of systemic lupus erythematosus: Secondary | ICD-10-CM | POA: Diagnosis not present

## 2018-06-03 ENCOUNTER — Other Ambulatory Visit: Payer: Self-pay | Admitting: Internal Medicine

## 2018-06-03 MED ORDER — ZOLPIDEM TARTRATE 5 MG PO TABS: 5.0000 mg | ORAL_TABLET | Freq: Every evening | ORAL | 3 refills | Status: DC | PRN

## 2018-06-03 NOTE — Telephone Encounter (Signed)
[place] Controlled Substance Database checked. Last filled on 05/04/18  Last follow-up OV 11/17/17

## 2018-06-03 NOTE — Telephone Encounter (Signed)
Resent

## 2018-06-03 NOTE — Addendum Note (Signed)
Addended by: Pricilla Holm A on: 06/03/2018 10:44 AM   Modules accepted: Orders

## 2018-06-03 NOTE — Telephone Encounter (Signed)
Please resend- did not go through first time.

## 2018-06-15 DIAGNOSIS — M328 Other forms of systemic lupus erythematosus: Secondary | ICD-10-CM | POA: Diagnosis not present

## 2018-06-15 DIAGNOSIS — M2550 Pain in unspecified joint: Secondary | ICD-10-CM | POA: Diagnosis not present

## 2018-06-15 DIAGNOSIS — Z79899 Other long term (current) drug therapy: Secondary | ICD-10-CM | POA: Diagnosis not present

## 2018-06-15 DIAGNOSIS — Z6824 Body mass index (BMI) 24.0-24.9, adult: Secondary | ICD-10-CM | POA: Diagnosis not present

## 2018-06-15 DIAGNOSIS — L932 Other local lupus erythematosus: Secondary | ICD-10-CM | POA: Diagnosis not present

## 2018-06-16 DIAGNOSIS — H25811 Combined forms of age-related cataract, right eye: Secondary | ICD-10-CM | POA: Diagnosis not present

## 2018-06-16 DIAGNOSIS — H2511 Age-related nuclear cataract, right eye: Secondary | ICD-10-CM | POA: Diagnosis not present

## 2018-07-02 ENCOUNTER — Other Ambulatory Visit: Payer: Self-pay | Admitting: Emergency Medicine

## 2018-07-16 ENCOUNTER — Ambulatory Visit: Payer: Self-pay | Admitting: *Deleted

## 2018-07-16 NOTE — Telephone Encounter (Signed)
Summary: mucus in throat    Pt called and stated that she has mucus in her throat and cannot get mucus out. Pt would like a call back from the nurse. Please advise      Call to patient: Patient has sinus drainage that is so heavy that it is collecting in her throat and causing her to cough it up. Patient is presently using Mucinex for symptoms now. Patient is a high risk patient with COPD and would like advisement on treatment. Patient would be good candidate for video visit- will send triage call to office for appointment- patient states she has access to computer for video visit.  Reason for Disposition . [1] Sinus congestion (pressure, fullness) AND [2] present > 10 days  Answer Assessment - Initial Assessment Questions 1. ONSET: "When did the nasal discharge start?"      1 1/2 weeks ago 2. AMOUNT: "How much discharge is there?"      A lot- draining down back of throat causing coughing 3. COUGH: "Do you have a cough?" If yes, ask: "Describe the color of your sputum" (clear, white, yellow, green)     Yes- due to drainage- clear/tan 4. RESPIRATORY DISTRESS: "Describe your breathing."      Not with cough- she does have COPD and she has trouble anyway 5. FEVER: "Do you have a fever?" If so, ask: "What is your temperature, how was it measured, and when did it start?"     no 6. SEVERITY: "Overall, how bad are you feeling right now?" (e.g., doesn't interfere with normal activities, staying home from school/work, staying in bed)      Fine as long as she is moving- normal activites 7. OTHER SYMPTOMS: "Do you have any other symptoms?" (e.g., sore throat, earache, wheezing, vomiting)     No- back of tongue tender- some irritation 8. PREGNANCY: "Is there any chance you are pregnant?" "When was your last menstrual period?"     n/a  Protocols used: COMMON COLD-A-AH

## 2018-07-16 NOTE — Telephone Encounter (Signed)
Would you like for me to schedule this patient as a virtual visit? Please advise.

## 2018-07-17 ENCOUNTER — Ambulatory Visit (INDEPENDENT_AMBULATORY_CARE_PROVIDER_SITE_OTHER): Payer: PPO | Admitting: Internal Medicine

## 2018-07-17 ENCOUNTER — Encounter: Payer: Self-pay | Admitting: Internal Medicine

## 2018-07-17 DIAGNOSIS — J440 Chronic obstructive pulmonary disease with acute lower respiratory infection: Secondary | ICD-10-CM

## 2018-07-17 MED ORDER — PREDNISONE 20 MG PO TABS: 40.0000 mg | ORAL_TABLET | Freq: Every day | ORAL | 0 refills | Status: DC

## 2018-07-17 MED ORDER — PROMETHAZINE-DM 6.25-15 MG/5ML PO SYRP: 5.0000 mL | ORAL_SOLUTION | Freq: Every evening | ORAL | 0 refills | Status: DC | PRN

## 2018-07-17 NOTE — Progress Notes (Signed)
Virtual Visit via Video Note  I connected with Sheila Morales on 07/17/18 at  2:40 PM EDT by a video enabled telemedicine application and verified that I am speaking with the correct person using two identifiers.   I discussed the limitations of evaluation and management by telemedicine and the availability of in person appointments. The patient expressed understanding and agreed to proceed.  History of Present Illness: The patient is a 68 y.o. YO female with visit for drainage in sinuses and throat. Has concurrent COPD and is still smoking. Started about 5-6 days ago. Has cough and sinus congestion. Denies fevers or chills. Denies worsening SOB. Overall it is stable since onset. Has tried otc tylenol cold and flu and mucinex without the DM. This did not help much. Still taking anoro and using albuterol inhaler about 2-3 times per day which is more than usual.   Observations/Objective: Appearance: tired, breathing appears normal, normal grooming, abdomen does not appear distended, throat appears mild redness, mental status is A and O times 3  Assessment and Plan: See problem oriented charting  Follow Up Instructions: rx for prednisone and promethazine/DM cough syrup, advised to start claritin, call back if no improvement in 2-3 days, ER for new SOB  I discussed the assessment and treatment plan with the patient. The patient was provided an opportunity to ask questions and all were answered. The patient agreed with the plan and demonstrated an understanding of the instructions.   The patient was advised to call back or seek an in-person evaluation if the symptoms worsen or if the condition fails to improve as anticipated.  I provided 15 minutes of non-face-to-face time during this encounter.  Hoyt Koch, MD

## 2018-07-17 NOTE — Telephone Encounter (Signed)
Can do virtual visit 

## 2018-07-17 NOTE — Patient Instructions (Signed)
We have sent in the cough medicine which you can use up to 4 times a day for cough.  We have sent in prednisone to take 2 pills daily for 5 days. If no better Monday/Tuesday call the office for more advice.

## 2018-07-17 NOTE — Telephone Encounter (Signed)
scheduled

## 2018-07-17 NOTE — Assessment & Plan Note (Signed)
Suspect viral/allergic. Rx for prednisone and promethazine/dm. No antibiotics indicated and will rx if no improvement next week. Does not sound suspicious for coronavirus.

## 2018-08-31 DIAGNOSIS — M2550 Pain in unspecified joint: Secondary | ICD-10-CM | POA: Diagnosis not present

## 2018-08-31 DIAGNOSIS — M328 Other forms of systemic lupus erythematosus: Secondary | ICD-10-CM | POA: Diagnosis not present

## 2018-08-31 DIAGNOSIS — Z79899 Other long term (current) drug therapy: Secondary | ICD-10-CM | POA: Diagnosis not present

## 2018-08-31 DIAGNOSIS — L932 Other local lupus erythematosus: Secondary | ICD-10-CM | POA: Diagnosis not present

## 2018-09-01 DIAGNOSIS — M328 Other forms of systemic lupus erythematosus: Secondary | ICD-10-CM | POA: Diagnosis not present

## 2018-09-07 DIAGNOSIS — D225 Melanocytic nevi of trunk: Secondary | ICD-10-CM | POA: Diagnosis not present

## 2018-09-07 DIAGNOSIS — L821 Other seborrheic keratosis: Secondary | ICD-10-CM | POA: Diagnosis not present

## 2018-09-07 DIAGNOSIS — L930 Discoid lupus erythematosus: Secondary | ICD-10-CM | POA: Diagnosis not present

## 2018-09-07 DIAGNOSIS — D2372 Other benign neoplasm of skin of left lower limb, including hip: Secondary | ICD-10-CM | POA: Diagnosis not present

## 2018-09-16 DIAGNOSIS — H2512 Age-related nuclear cataract, left eye: Secondary | ICD-10-CM | POA: Diagnosis not present

## 2018-09-16 DIAGNOSIS — H25042 Posterior subcapsular polar age-related cataract, left eye: Secondary | ICD-10-CM | POA: Diagnosis not present

## 2018-09-16 DIAGNOSIS — H25012 Cortical age-related cataract, left eye: Secondary | ICD-10-CM | POA: Diagnosis not present

## 2018-09-18 DIAGNOSIS — R7989 Other specified abnormal findings of blood chemistry: Secondary | ICD-10-CM | POA: Diagnosis not present

## 2018-09-22 DIAGNOSIS — H25012 Cortical age-related cataract, left eye: Secondary | ICD-10-CM | POA: Diagnosis not present

## 2018-09-22 DIAGNOSIS — H25812 Combined forms of age-related cataract, left eye: Secondary | ICD-10-CM | POA: Diagnosis not present

## 2018-09-22 DIAGNOSIS — H25042 Posterior subcapsular polar age-related cataract, left eye: Secondary | ICD-10-CM | POA: Diagnosis not present

## 2018-09-22 DIAGNOSIS — H2512 Age-related nuclear cataract, left eye: Secondary | ICD-10-CM | POA: Diagnosis not present

## 2018-09-25 ENCOUNTER — Ambulatory Visit: Payer: Self-pay | Admitting: *Deleted

## 2018-09-25 ENCOUNTER — Ambulatory Visit (INDEPENDENT_AMBULATORY_CARE_PROVIDER_SITE_OTHER): Payer: PPO | Admitting: Internal Medicine

## 2018-09-25 ENCOUNTER — Encounter: Payer: Self-pay | Admitting: Internal Medicine

## 2018-09-25 DIAGNOSIS — K581 Irritable bowel syndrome with constipation: Secondary | ICD-10-CM

## 2018-09-25 MED ORDER — HYOSCYAMINE SULFATE 0.125 MG SL SUBL: 0.1250 mg | SUBLINGUAL_TABLET | SUBLINGUAL | 0 refills | Status: DC | PRN

## 2018-09-25 NOTE — Telephone Encounter (Signed)
Noted thanks °

## 2018-09-25 NOTE — Telephone Encounter (Signed)
Patient complains of pain in lower abdomin yesterday- patient took dulcolax and had "blow out". Pain is still present- but not as bad. Patient states she bloating with eating which starts the cycle of pain.Patient suffers with the pain and she  eases off with time- patient feels like she is bloated all the time. (Patient reports she did have black out spell in April while working outside doing yard work. She wanted to make sure PCP was aware that this happened.)  Reason for Disposition . [1] MILD-MODERATE pain AND [2] constant AND [3] present > 2 hours  Answer Assessment - Initial Assessment Questions 1. LOCATION: "Where does it hurt?"      Lower middle/left quadrant 2. RADIATION: "Does the pain shoot anywhere else?" (e.g., chest, back)     Sometimes under the breast area- does not stay there very long 3. ONSET: "When did the pain begin?" (e.g., minutes, hours or days ago)      Yesterday- patient has bloating with eating 4. SUDDEN: "Gradual or sudden onset?"     After eating- gradual 5. PATTERN "Does the pain come and go, or is it constant?"    - If constant: "Is it getting better, staying the same, or worsening?"      (Note: Constant means the pain never goes away completely; most serious pain is constant and it progresses)     - If intermittent: "How long does it last?" "Do you have pain now?"     (Note: Intermittent means the pain goes away completely between bouts)     Constant- eases down in intensity eases 6. SEVERITY: "How bad is the pain?"  (e.g., Scale 1-10; mild, moderate, or severe)   - MILD (1-3): doesn't interfere with normal activities, abdomen soft and not tender to touch    - MODERATE (4-7): interferes with normal activities or awakens from sleep, tender to touch    - SEVERE (8-10): excruciating pain, doubled over, unable to do any normal activities      Now- 3-4 7. RECURRENT SYMPTOM: "Have you ever had this type of abdominal pain before?" If so, ask: "When was the last  time?" and "What happened that time?"      Last year in July- went to hospital- kidney infection 8. CAUSE: "What do you think is causing the abdominal pain?"     Patient was able to move bowel yesterday- pain and pressure still present- not as bad 9. RELIEVING/AGGRAVATING FACTORS: "What makes it better or worse?" (e.g., movement, antacids, bowel movement)     Not really seeing a difference in bowels moving 10. OTHER SYMPTOMS: "Has there been any vomiting, diarrhea, constipation, or urine problems?"       No problems 11. PREGNANCY: "Is there any chance you are pregnant?" "When was your last menstrual period?"       n/a  Protocols used: ABDOMINAL PAIN - [facility]

## 2018-09-25 NOTE — Telephone Encounter (Signed)
Appointment scheduled.

## 2018-09-25 NOTE — Progress Notes (Signed)
Virtual Visit via Audio only Note  I connected with Sheila Morales on 09/25/18 at 10:40 AM EDT by an audio enabled telemedicine application due to failure of video enabled telemedicine device and verified that I am speaking with the correct person using two identifiers.  The patient and the provider were at separate locations throughout the entire encounter.   I discussed the limitations of evaluation and management by telemedicine and the availability of in person appointments. The patient expressed understanding and agreed to proceed.  History of Present Illness: The patient is a 68 y.o. female with visit for abdominal pain and mild constipation. Took dulcolax last night and has had several BMs since that time. Started 2-3 days ago with 6/10 pain in abdomen diffuse. Denies current pain except 1/10. Denies blood in stool. Often this is triggered by some constipation. Denies fevers or chills. Denies specific location to the pain. Denies vomiting but had some nausea with the constipation. Denies taking anything regularly for constipation. Tries to eat varied diet but does not pay attention to fiber content. Has improvement since having the BMs. Overall it is improving. Has tried dulcolax with some success  Observations/Objective: Breathing normal and voice strong without dyspnea   Assessment and Plan: See problem oriented charting  Follow Up Instructions: rx hyoscyamine and talked about starting metamucil or similar.   Visit time 12 minutes: that time was spent in face to face counseling and coordination of care with the patient: counseled about as above  I discussed the assessment and treatment plan with the patient. The patient was provided an opportunity to ask questions and all were answered. The patient agreed with the plan and demonstrated an understanding of the instructions.   The patient was advised to call back or seek an in-person evaluation if the symptoms worsen or if the condition  fails to improve as anticipated.  Hoyt Koch, MD

## 2018-09-25 NOTE — Assessment & Plan Note (Signed)
Likely the cause of her intermittent problems but sounds like exacerbated by constipation and encouraged to take metamucil to help avoid constipation. Rx for hyoscyamine to help with pain when present.

## 2018-09-25 NOTE — Telephone Encounter (Signed)
Can have visit virtual if desired for symptoms.

## 2018-09-26 ENCOUNTER — Other Ambulatory Visit: Payer: Self-pay | Admitting: Emergency Medicine

## 2018-09-28 ENCOUNTER — Telehealth: Payer: Self-pay

## 2018-09-28 MED ORDER — DICYCLOMINE HCL 10 MG PO CAPS: 10.0000 mg | ORAL_CAPSULE | Freq: Three times a day (TID) | ORAL | 0 refills | Status: DC

## 2018-09-28 NOTE — Addendum Note (Signed)
Addended by: Pricilla Holm A on: 09/28/2018 04:00 PM   Modules accepted: Orders

## 2018-09-28 NOTE — Telephone Encounter (Signed)
Copied from [place] [id number]. Topic: General - Other >> Sep 28, 2018 12:46 PM Leward Quan A wrote: Reason for CRM: Patient called to say that she is still having some abdominal pain she states that the medication given does not work. Please call Ph# [phone]

## 2018-09-28 NOTE — Telephone Encounter (Signed)
Is she still having the constipation? Have sent in an alternate called bentyl which she can try.

## 2018-09-28 NOTE — Telephone Encounter (Signed)
States that she is still having the pressure and really gassy. States it feels like she has to poop but she is able to go. Patient took benefiber a little while ago but will get the medicine to see if it works

## 2018-09-30 ENCOUNTER — Other Ambulatory Visit: Payer: Self-pay | Admitting: Emergency Medicine

## 2018-09-30 ENCOUNTER — Other Ambulatory Visit: Payer: Self-pay | Admitting: Internal Medicine

## 2018-09-30 DIAGNOSIS — J209 Acute bronchitis, unspecified: Secondary | ICD-10-CM

## 2018-09-30 NOTE — Telephone Encounter (Signed)
[place] Controlled Database Checked Last filled: 09/01/18 # 30 LOV w/you: 09/25/18 Next appt w/you: None

## 2018-10-02 ENCOUNTER — Telehealth: Payer: Self-pay | Admitting: Emergency Medicine

## 2018-10-02 DIAGNOSIS — J209 Acute bronchitis, unspecified: Secondary | ICD-10-CM

## 2018-10-02 MED ORDER — ALBUTEROL SULFATE HFA 108 (90 BASE) MCG/ACT IN AERS: 1.0000 | INHALATION_SPRAY | Freq: Four times a day (QID) | RESPIRATORY_TRACT | 1 refills | Status: DC | PRN

## 2018-10-02 NOTE — Telephone Encounter (Signed)
Refill of proventil inhaler sent to pt's preferred pharmacy. Called and spoke with pt letting her know this had been done and pt verbalized understanding. Nothing further needed.

## 2018-10-05 ENCOUNTER — Telehealth: Payer: Self-pay | Admitting: Internal Medicine

## 2018-10-05 NOTE — Telephone Encounter (Signed)
Noted  

## 2018-10-05 NOTE — Telephone Encounter (Signed)
I am unclear what this means streaks going up legs?

## 2018-10-05 NOTE — Telephone Encounter (Signed)
Called patient and states she had dark streaks up her leg. Was unsure what it was. Did have a small cat bite and states that was a week or so ago when it happened. Has noticed that the streaks are going away, no pian, no swelling, and will keep an eye on it if it states getting worse will call back  FYI Patient wanted to let us know that she is taking the hycosamine before each meal and bedtime and does seem to be helping but is still going 3-4 times a day and has some cramping. States is giving it some more time to see if it gets even better.

## 2018-10-05 NOTE — Telephone Encounter (Signed)
AccessNurse Call: 10/04/2018 at 2:12pm -   Patient called stating that she has had streaks going up both legs, more on the left leg. This occurred about an hour ago.  She was advised to see HCP within 4 hours.

## 2018-10-06 ENCOUNTER — Other Ambulatory Visit: Payer: Self-pay | Admitting: Internal Medicine

## 2018-10-12 ENCOUNTER — Telehealth: Payer: Self-pay

## 2018-10-12 NOTE — Telephone Encounter (Signed)
Patient wants to know if she is able to take the IB guard with the benefiber

## 2018-10-12 NOTE — Telephone Encounter (Signed)
Patient informed of MD response  

## 2018-10-12 NOTE — Telephone Encounter (Signed)
She can schedule with GI, is she still constipated? If so I would recommend to try something else otc for the constipation as this will likely help the stomach discomfort. Can try IB guard over the counter for pain.

## 2018-10-12 NOTE — Telephone Encounter (Signed)
Yes, she is able to do that together.

## 2018-10-12 NOTE — Telephone Encounter (Signed)
Copied from [place] [id number]. Topic: General - Other >> Oct 12, 2018 12:18 PM Rainey Pines A wrote: Patient would like to let Dr. Charlynne Cousins nurse know that the dicyclomine (BENTYL) 10 MG capsule prescribed isn't working for her. Patient wants to know if she would need to schedule a visit Dr .Carlean Purl for stomach pressure she is having.

## 2018-10-15 ENCOUNTER — Other Ambulatory Visit: Payer: Self-pay | Admitting: Internal Medicine

## 2018-10-29 ENCOUNTER — Encounter: Payer: Self-pay | Admitting: Internal Medicine

## 2018-10-31 ENCOUNTER — Other Ambulatory Visit: Payer: Self-pay | Admitting: Gastroenterology

## 2018-11-02 ENCOUNTER — Telehealth: Payer: Self-pay | Admitting: Internal Medicine

## 2018-11-02 MED ORDER — PANTOPRAZOLE SODIUM 40 MG PO TBEC: 40.0000 mg | DELAYED_RELEASE_TABLET | Freq: Two times a day (BID) | ORAL | 0 refills | Status: DC

## 2018-11-02 NOTE — Telephone Encounter (Signed)
I informed Sheila Morales that we are refilling her medicine and we will see her at her visit in August.

## 2018-11-02 NOTE — Telephone Encounter (Signed)
Pt has an appt with Dr. Carlean Purl in August but is running out of pantoprazole so wants to know if he can rf med. She uses [facility]

## 2018-11-09 ENCOUNTER — Ambulatory Visit: Payer: Self-pay | Admitting: Internal Medicine

## 2018-11-09 ENCOUNTER — Ambulatory Visit (INDEPENDENT_AMBULATORY_CARE_PROVIDER_SITE_OTHER)
Admission: RE | Admit: 2018-11-09 | Discharge: 2018-11-09 | Disposition: A | Discharge status: Home / Self Care | Payer: PPO | Source: Ambulatory Visit | Attending: Internal Medicine | Admitting: Internal Medicine

## 2018-11-09 ENCOUNTER — Ambulatory Visit (INDEPENDENT_AMBULATORY_CARE_PROVIDER_SITE_OTHER): Payer: PPO | Admitting: Internal Medicine

## 2018-11-09 ENCOUNTER — Encounter: Payer: Self-pay | Admitting: Internal Medicine

## 2018-11-09 ENCOUNTER — Other Ambulatory Visit: Payer: Self-pay

## 2018-11-09 DIAGNOSIS — R0789 Other chest pain: Secondary | ICD-10-CM

## 2018-11-09 DIAGNOSIS — M7989 Other specified soft tissue disorders: Secondary | ICD-10-CM | POA: Diagnosis not present

## 2018-11-09 DIAGNOSIS — M25532 Pain in left wrist: Secondary | ICD-10-CM | POA: Diagnosis not present

## 2018-11-09 DIAGNOSIS — R079 Chest pain, unspecified: Secondary | ICD-10-CM | POA: Diagnosis not present

## 2018-11-09 DIAGNOSIS — R0781 Pleurodynia: Secondary | ICD-10-CM | POA: Diagnosis not present

## 2018-11-09 DIAGNOSIS — S299XXA Unspecified injury of thorax, initial encounter: Secondary | ICD-10-CM | POA: Diagnosis not present

## 2018-11-09 DIAGNOSIS — S6992XA Unspecified injury of left wrist, hand and finger(s), initial encounter: Secondary | ICD-10-CM | POA: Diagnosis not present

## 2018-11-09 NOTE — Telephone Encounter (Signed)
Would you like for patient to have a in office or doxy?

## 2018-11-09 NOTE — Patient Instructions (Signed)
Please continue all other medications as before  Please have the pharmacy call with any other refills you may need.  Please keep your appointments with your specialists as you may have planned  Please go to the XRAY Department in the Basement (go straight as you get off the elevator) for the x-ray testing  You will be contacted by phone if any changes need to be made immediately.  Otherwise, you will receive a letter about your results with an explanation, but please check with MyChart first.  Please remember to sign up for MyChart if you have not done so, as this will be important to you in the future with finding out test results, communicating by private email, and scheduling acute appointments online when needed.

## 2018-11-09 NOTE — Telephone Encounter (Signed)
Please schedule in office with any provider, sooner rather than later.

## 2018-11-09 NOTE — Telephone Encounter (Signed)
Appointment has been for today 7/20 with Dr.John

## 2018-11-09 NOTE — Telephone Encounter (Signed)
noted 

## 2018-11-09 NOTE — Telephone Encounter (Signed)
Pt. Reports she fell Friday in [employer]. As the weekend went on she has had increased pain to her left ribs at the breast area. Has to find a comfortable position to rest. Pain with coughing and deep breath. Would like to know if practice can do an xray. Please advise pt.   Answer Assessment - Initial Assessment Questions 1. MECHANISM: "How did the injury happen?"     [address] in [employer] Friday 2. ONSET: "When did the injury happen?" (Minutes or hours ago)     Friday 3. LOCATION: "Where on the chest is the injury located?"     Left ribs 4. APPEARANCE: "What does the injury look like?"     No bruising 5. BLEEDING: "Is there any bleeding now? If so, ask: How long has it been bleeding?"     None 6. SEVERITY: "Any difficulty with breathing?"     No 7. SIZE: For cuts, bruises, or swelling, ask: "How large is it?" (e.g., inches or centimeters)     No 8. PAIN: "Is there pain?" If so, ask: "How bad is the pain?"   (e.g., Scale 1-10; or mild, moderate, severe)     8 9. TETANUS: For any breaks in the skin, ask: "When was the last tetanus booster?"     Unsure 10. PREGNANCY: "Is there any chance you are pregnant?" "When was your last menstrual period?"       No  Protocols used: CHEST INJURY-A-AH

## 2018-11-09 NOTE — Progress Notes (Signed)
Subjective:    Patient ID: Sheila Morales, female    DOB: 04-Feb-1951, 68 y.o.   MRN: [id number]  HPI  Here to f/u after an unfortunate trip and fall forward and sideways to the left x 2 days ago, with striking the left chest and side, as well as left wrist, all with mild to mod swelling and pain persistent, fearing she broke a bone.  Hurts to breathe and use the left hand and wrist, nothing else makes better or worse.   Pt denies fever, wt loss, night sweats, loss of appetite, or other constitutional symptoms  Pt denies increased sob or doe, wheezing, orthopnea, PND, increased LE swelling, palpitations, dizziness or syncope.  Pt denies new neurological symptoms such as new headache, or facial or extremity weakness or numbness   Pt denies polydipsia, polyuria.   Past Medical History:  Diagnosis Date  . Arthritis   . Colitis, ischemic ([place])   . External hemorrhoids   . H/O: rheumatic fever   . IBS (irritable bowel syndrome)   . Personal history colonic adenoma 03/25/2008   06/2007 right sided adenoma and 2 right hyperplastic polyps    . Restless leg syndrome    questionable  . Systemic lupus erythematosus ([place])   . Tubular adenoma of colon   . 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 CHOLECYSTECTOMY    . CHOLECYSTECTOMY, LAPAROSCOPIC    . COLONOSCOPY    . ESOPHAGOGASTRODUODENOSCOPY    . lesion, vulva excision    . TONSILLECTOMY    . VIDEO BRONCHOSCOPY Bilateral 11/06/2017   Procedure: VIDEO BRONCHOSCOPY WITHOUT FLUORO;  Surgeon: Marshell Garfinkel, MD;  Location: [facility];  Service: Cardiopulmonary;  Laterality: Bilateral;    reports that she has been smoking cigarettes. She has a 7.50 pack-year smoking history. She has never used smokeless tobacco. She reports current alcohol use of about 5.0 standard drinks of alcohol per week. She reports that she does not use drugs. family history includes Atrial fibrillation in her father; Colon  cancer in her father; Healthy in her daughter; Other in her sister; Stroke in her mother. No Known Allergies Current Outpatient Medications on File Prior to Visit  Medication Sig Dispense Refill  . albuterol (VENTOLIN HFA) 108 (90 Base) MCG/ACT inhaler Inhale 1-2 puffs into the lungs every 6 (six) hours as needed for wheezing or shortness of breath. 18 Inhaler 1  . AMBULATORY NON FORMULARY MEDICATION Medication Name: GI cocktail 5-10 mL every 4-6 hours as needed for pain 450 mL 1  . ANORO ELLIPTA 62.5-25 MCG/INH AEPB INHALE 1 PUFF BY MOUTH EVERY DAY 60 each 1  . aspirin 81 MG tablet Take 81 mg by mouth daily.    . cholecalciferol (VITAMIN D) 1000 UNITS tablet Take 1,000 Units by mouth daily.      . colestipol (COLESTID) 5 G granules 1 scoop alternating with 1/2 scoop 500 g 12  . dicyclomine (BENTYL) 10 MG capsule TAKE 1 CAPSULE (10 MG TOTAL) BY MOUTH 4 (FOUR) TIMES DAILY - BEFORE MEALS AND AT BEDTIME. 90 capsule 0  . folic acid (FOLVITE) 1 MG tablet Take 1 mg by mouth daily.    Marland Kitchen gabapentin (NEURONTIN) 300 MG capsule Take 1 capsule (300 mg total) by mouth 3 (three) times daily. 2 po qam, 2 po afternoon, 3 po qhs (Patient taking differently: Take 600-900 mg by mouth See admin instructions. 600 mg every morning & afternoon, 900 mg at bedtime)  630 capsule 1  . glucosamine-chondroitin 500-400 MG tablet Take 2 tablets by mouth 2 (two) times daily.    Marland Kitchen guaiFENesin-dextromethorphan (ROBITUSSIN DM) 100-10 MG/5ML syrup Take 5 mLs by mouth every 4 (four) hours as needed for cough. 118 mL 0  . hydroxychloroquine (PLAQUENIL) 200 MG tablet Take 200 mg by mouth 2 (two) times daily.    . hyoscyamine (LEVSIN SL) 0.125 MG SL tablet Place 1 tablet (0.125 mg total) under the tongue every 4 (four) hours as needed. 30 tablet 0  . methotrexate 2.5 MG tablet Take by mouth once a week.    . MULTIPLE VITAMIN PO Take 1 tablet by mouth daily.      . Omega-3 1000 MG CAPS Take 1,000 mg by mouth daily.    . pantoprazole  (PROTONIX) 40 MG tablet Take 1 tablet (40 mg total) by mouth 2 (two) times daily before a meal. 180 tablet 0  . RESTASIS 0.05 % ophthalmic emulsion INSTILL 1 DROP INTO BOTH EYES TWICE A DAY  6  . saccharomyces boulardii (FLORASTOR) 250 MG capsule Take 1 capsule (250 mg total) by mouth 2 (two) times daily. 60 capsule 1  . triamcinolone cream (KENALOG) 0.1 % APPLY TO AFFECTED AREA TWICE A DAY FOR 2 WEEK AS DIRECTED  0  . vitamin C (ASCORBIC ACID) 500 MG tablet Take 500 mg by mouth daily.      . vitamin E 1000 UNIT capsule Take 1,000 Units by mouth daily.    Marland Kitchen zolpidem (AMBIEN) 5 MG tablet TAKE 1 TABLET (5 MG TOTAL) BY MOUTH AT BEDTIME AS NEEDED. 30 tablet 3   No current facility-administered medications on file prior to visit.    Review of Systems  Constitutional: Negative for other unusual diaphoresis or sweats HENT: Negative for ear discharge or swelling Eyes: Negative for other worsening visual disturbances Respiratory: Negative for stridor or other swelling  Gastrointestinal: Negative for worsening distension or other blood Genitourinary: Negative for retention or other urinary change Musculoskeletal: Negative for other MSK pain or swelling Skin: Negative for color change or other new lesions Neurological: Negative for worsening tremors and other numbness  Psychiatric/Behavioral: Negative for worsening agitation or other fatigue All other system neg per pt    Objective:   Physical Exam BP 114/68   Pulse (!) 59   Temp 98.3 F (36.8 C) (Oral)   Ht 5\' 2"  (1.575 m)   Wt 132 lb (59.9 kg)   SpO2 98%   BMI 24.14 kg/m  VS noted,  Constitutional: Pt appears in NAD HENT: Head: NCAT.  Right Ear: External ear normal.  Left Ear: External ear normal.  Eyes: . Pupils are equal, round, and reactive to light. Conjunctivae and EOM are normal Nose: without d/c or deformity Neck: Neck supple. Gross normal ROM Cardiovascular: Normal rate and regular rhythm.  left chest wall tender in mid  axillary line about t7-8 without bruising or swelilng Left wrist with posterior medial swelling and mild tender, o/w neurovasc intact Pulmonary/Chest: Effort normal and breath sounds without rales or wheezing.  Abd:  Soft, NT, ND, + BS, no organomegaly Neurological: Pt is alert. At baseline orientation, motor grossly intact Skin: Skin is warm. No rashes, other new lesions, no LE edema Psychiatric: Pt behavior is normal without agitation  No other exam findings Lab Results  Component Value Date   WBC 8.1 11/17/2017   HGB 10.9 (L) 11/17/2017   HCT 32.3 (L) 11/17/2017   PLT 381.0 11/17/2017   GLUCOSE 97 11/17/2017  CHOL 146 02/24/2017   TRIG 288.0 (H) 02/24/2017   HDL 70.80 02/24/2017   LDLDIRECT 52.0 02/24/2017   LDLCALC 84 11/21/2006   ALT 40 (H) 11/17/2017   AST 23 11/17/2017   NA 133 (L) 11/17/2017   K 3.9 11/17/2017   CL 95 (L) 11/17/2017   CREATININE 0.96 11/17/2017   BUN 17 11/17/2017   CO2 29 11/17/2017   TSH 1.10 01/24/2015   HGBA1C 5.7 02/24/2017       Assessment & Plan:

## 2018-11-10 ENCOUNTER — Telehealth: Payer: Self-pay | Admitting: Internal Medicine

## 2018-11-10 NOTE — Telephone Encounter (Signed)
Patient requesting call back from CMA to discuss imaging results from 7/20.

## 2018-11-10 NOTE — Telephone Encounter (Signed)
Patient was not aware of letters sent to [facility] by dr Jenny Reichmann, I have explained findings with recent imaging results, I have advised that patient can use tylenol/advil for pain and swelling, along with intermittent icing for comfort, can also get braces/support for ribs/wrist that can provide comfort which are OTC at most drug stores---if symptoms worsen, call us back, dr Sharlet Salina is out of office this week, but we could arrange for her to see another provider or sports med provider here at our office if needed

## 2018-11-11 ENCOUNTER — Telehealth: Payer: Self-pay | Admitting: Internal Medicine

## 2018-11-11 NOTE — Telephone Encounter (Signed)
Patient would like a refill on her zolpidem (AMBIEN) 5 MG tablet  medication and have it sent to her preferred pharmacy [facility] on [address].

## 2018-11-13 NOTE — Telephone Encounter (Signed)
Check [place] registry last filled 10/01/2018. MD is out of the office pls advise on refill.Marland KitchenJohny Chess

## 2018-11-15 ENCOUNTER — Encounter: Payer: Self-pay | Admitting: Internal Medicine

## 2018-11-15 NOTE — Assessment & Plan Note (Signed)
Also for cxr r/o underlying pulm process

## 2018-11-15 NOTE — Assessment & Plan Note (Signed)
Seems less likely but cant r/o fx -for left wrist film, declines pain med

## 2018-11-15 NOTE — Assessment & Plan Note (Signed)
Exam benign except for tenderness, cant r/o fx - for rib films,  to f/u any worsening symptoms or concerns

## 2018-11-16 MED ORDER — ZOLPIDEM TARTRATE 5 MG PO TABS: 5.0000 mg | ORAL_TABLET | Freq: Every evening | ORAL | 0 refills | Status: DC | PRN

## 2018-11-16 NOTE — Telephone Encounter (Signed)
Done erx 

## 2018-11-18 ENCOUNTER — Telehealth: Payer: Self-pay | Admitting: Emergency Medicine

## 2018-11-18 NOTE — Telephone Encounter (Signed)
LMTCB x1 for pt.  

## 2018-11-18 NOTE — Telephone Encounter (Signed)
Pt fell at the store about 10 days ago.  CXR 7/20 >> no fracture or PTX seen  Having progressive L chest discomfort, waxing and waning dyspnea. She does not sound like she is in any distress by history or over the phone.   I will have her come in to be seen at our office today, repeat her CXR to insure no evolving abnormality. Please set her up with an OV. Thanks.

## 2018-12-01 ENCOUNTER — Other Ambulatory Visit: Payer: Self-pay

## 2018-12-01 ENCOUNTER — Ambulatory Visit (INDEPENDENT_AMBULATORY_CARE_PROVIDER_SITE_OTHER): Payer: PPO | Admitting: Internal Medicine

## 2018-12-01 ENCOUNTER — Encounter: Payer: Self-pay | Admitting: Internal Medicine

## 2018-12-01 ENCOUNTER — Encounter: Payer: Self-pay | Admitting: Emergency Medicine

## 2018-12-01 ENCOUNTER — Ambulatory Visit: Payer: PPO | Admitting: Emergency Medicine

## 2018-12-01 VITALS — BP 138/70 | HR 64 | Temp 97.7°F | Ht 62.0 in | Wt 128.0 lb

## 2018-12-01 DIAGNOSIS — R1013 Epigastric pain: Secondary | ICD-10-CM

## 2018-12-01 DIAGNOSIS — J449 Chronic obstructive pulmonary disease, unspecified: Secondary | ICD-10-CM | POA: Diagnosis not present

## 2018-12-01 DIAGNOSIS — F17210 Nicotine dependence, cigarettes, uncomplicated: Secondary | ICD-10-CM | POA: Diagnosis not present

## 2018-12-01 DIAGNOSIS — K581 Irritable bowel syndrome with constipation: Secondary | ICD-10-CM | POA: Diagnosis not present

## 2018-12-01 DIAGNOSIS — F17200 Nicotine dependence, unspecified, uncomplicated: Secondary | ICD-10-CM

## 2018-12-01 NOTE — Patient Instructions (Signed)
Please continue to work on decreasing your smoking. Continue Anoro once daily. Keep your albuterol available use 2 puffs if needed for shortness of breath, chest tightness, wheezing.  If the Proventil does not help you as much as your previous prescription then we could consider trying to change it going forward.  Please let us know. Get the flu shot in the fall. Follow with Dr Lamonte Sakai in 6 months or sooner if you have any problems

## 2018-12-01 NOTE — Assessment & Plan Note (Signed)
Continue Anoro once daily. Keep your albuterol available use 2 puffs if needed for shortness of breath, chest tightness, wheezing.  If the Proventil does not help you as much as your previous prescription then we could consider trying to change it going forward.  Please let us know. Get the flu shot in the fall. Follow with Dr Lamonte Sakai in 6 months or sooner if you have any problems

## 2018-12-01 NOTE — Assessment & Plan Note (Signed)
Discussed her tobacco use and her smoking cessation efforts with her today.  She does want to stop.  Currently smoking about 5 to 10 cigarettes daily.  Of asked her to cut down in preparation for a trip that she is taking to visit family.  When she makes that trip she is going to try to stop altogether.

## 2018-12-01 NOTE — Patient Instructions (Signed)
Dr Carlean Purl recommends that you complete a bowel purge (to clean out your bowels). Please do the following: Purchase a bottle of Miralax over the counter as well as a box of 5 mg dulcolax tablets. Take 4 dulcolax tablets. Wait 1 hour. You will then drink 6-8 capfuls of Miralax mixed in an adequate amount of water/juice/gatorade (you may choose which of these liquids to drink) over the next 2-3 hours. You should expect results within 1 to 6 hours after completing the bowel purge.   After your purge try benefiber, start with one teaspoon daily and work up to one tablespoon daily.  If this makes you bloated you may try one dose of miralax daily.   Taper off your pantoprazole by cutting down to one dose every other day for a couple of weeks and then stop.   I appreciate the opportunity to care for you. Silvano Rusk, MD, [facility]

## 2018-12-01 NOTE — Progress Notes (Signed)
Sheila Morales 68 y.o. October 15, 1950 [id number]  Assessment & Plan:   Encounter Diagnoses  Name Primary?  . Irritable bowel syndrome with constipation Yes  . Abdominal pain, epigastric - resolved      Taper pantoprazole Miralax purge to empty out Then retry benefiber If that fails MiraLax If tat fails ? PT to see if that helps defecation problems, my rectal exam suggest that she had intact function but that is only so sensitive.  She is to let me know if what we are doing does not work we have not made a follow-up.   Subjective:   Chief Complaint: Abdominal pain constipation straining to stool  HPI Sera is here for follow-up, she was last seen in our office in 2019, she has a history of irritable bowel syndrome diarrhea predominant treated with alosetron but that was too effective so to speak with constipation.  She managed for a while and now is complaining of more of a constipation problem.  That developed in the last year or so.  She has seen [place] for that and is tried over-the-counter laxatives Benefiber bloated her.  That was prescribed recently by primary care, appropriately.  I do not think she is tried MiraLAX on a daily basis necessarily.  She strains to stool and has frequent incomplete defecation.  She does not have any genitourinary symptoms other than some urgency to urinate no leakage.  She does have 3 times a night nocturia I should say however.  Small children no childbirth injuries in the past.  Colonoscopy in 2016 with a distal hyperplastic polyp and left-sided diverticulosis, otherwise negative.  She asks if she needs to continue to take pantoprazole she cannot remember why she was on that, chart review shows that when she was seeing my physician assistant last year she was having some dyspepsia and epigastric pain that is completely gone and was not long lived.  There is no heartburn or dysphagia.  Previous upper endoscopy unrevealing 2016. No Known Allergies Current  Meds  Medication Sig  . albuterol (VENTOLIN HFA) 108 (90 Base) MCG/ACT inhaler Inhale 1-2 puffs into the lungs every 6 (six) hours as needed for wheezing or shortness of breath.  Jearl Klinefelter ELLIPTA 62.5-25 MCG/INH AEPB INHALE 1 PUFF BY MOUTH EVERY DAY  . aspirin 81 MG tablet Take 81 mg by mouth daily.  . cholecalciferol (VITAMIN D) 1000 UNITS tablet Take 1,000 Units by mouth daily.    Marland Kitchen dicyclomine (BENTYL) 10 MG capsule TAKE 1 CAPSULE (10 MG TOTAL) BY MOUTH 4 (FOUR) TIMES DAILY - BEFORE MEALS AND AT BEDTIME.  . folic acid (FOLVITE) 1 MG tablet Take 1 mg by mouth daily.  Marland Kitchen gabapentin (NEURONTIN) 300 MG capsule Take 1 capsule (300 mg total) by mouth 3 (three) times daily. 2 po qam, 2 po afternoon, 3 po qhs (Patient taking differently: Take 600-900 mg by mouth See admin instructions. 600 mg every morning & afternoon, 900 mg at bedtime)  . glucosamine-chondroitin 500-400 MG tablet Take 2 tablets by mouth 2 (two) times daily.  Marland Kitchen guaiFENesin-dextromethorphan (ROBITUSSIN DM) 100-10 MG/5ML syrup Take 5 mLs by mouth every 4 (four) hours as needed for cough.  . hydroxychloroquine (PLAQUENIL) 200 MG tablet Take 200 mg by mouth 2 (two) times daily.  . methotrexate 2.5 MG tablet Take by mouth once a week.  . MULTIPLE VITAMIN PO Take 1 tablet by mouth daily.    . Omega-3 1000 MG CAPS Take 1,000 mg by mouth daily.  . pantoprazole (PROTONIX)  40 MG tablet Take 1 tablet (40 mg total) by mouth 2 (two) times daily before a meal.  . RESTASIS 0.05 % ophthalmic emulsion INSTILL 1 DROP INTO BOTH EYES TWICE A DAY  . triamcinolone cream (KENALOG) 0.1 % APPLY TO AFFECTED AREA TWICE A DAY FOR 2 WEEK AS DIRECTED  . vitamin C (ASCORBIC ACID) 500 MG tablet Take 500 mg by mouth daily.    . vitamin E 1000 UNIT capsule Take 1,000 Units by mouth daily.  Marland Kitchen zolpidem (AMBIEN) 5 MG tablet Take 1 tablet (5 mg total) by mouth at bedtime as needed.  . [DISCONTINUED] colestipol (COLESTID) 5 G granules 1 scoop alternating with 1/2 scoop  .  [DISCONTINUED] saccharomyces boulardii (FLORASTOR) 250 MG capsule Take 1 capsule (250 mg total) by mouth 2 (two) times daily.   Past Medical History:  Diagnosis Date  . Acute respiratory failure with hypoxia ([place]) 11/05/2017  . Arthritis   . Colitis, ischemic ([place])   . External hemorrhoids   . H/O: rheumatic fever   . IBS (irritable bowel syndrome)   . Personal history colonic adenoma 03/25/2008   06/2007 right sided adenoma and 2 right hyperplastic polyps    . Restless leg syndrome    questionable  . Systemic lupus erythematosus ([place])   . Tubular adenoma of colon   . 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 CHOLECYSTECTOMY    . CHOLECYSTECTOMY, LAPAROSCOPIC    . COLONOSCOPY    . ESOPHAGOGASTRODUODENOSCOPY    . lesion, vulva excision    . TONSILLECTOMY    . VIDEO BRONCHOSCOPY Bilateral 11/06/2017   Procedure: VIDEO BRONCHOSCOPY WITHOUT FLUORO;  Surgeon: Marshell Garfinkel, MD;  Location: [facility];  Service: Cardiopulmonary;  Laterality: Bilateral;   Social History   Social History Narrative   HSG. Married '71. 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       family history includes Atrial fibrillation in her father; Colon cancer in her father; Healthy in her daughter; Other in her sister; Stroke in her mother.   Review of Systems  As per HPI, she did recently injure her chest wall and ribs in a fall and left wrist.  See PCP note 11/09/2018, I have reviewed. Objective:   Physical Exam BP 138/70   Pulse 64   Temp 97.7 F (36.5 C) (Oral)   Ht 5\' 2"  (1.575 m)   Wt 128 lb (58.1 kg)   BMI 23.41 kg/m  NAD Anicteric abd soft, mildly tender epigastrium LUQ - recent rib injury  Patti Martinique, CMA present.  Anoderm inspection revealed NL Anal wink was + Digital exam revealed normal resting tone and voluntary squeeze. No mass or rectocele  present. Simulated defecation with valsalva revealed appropriate abdominal contraction and descent.   15 minutes time spent with patient > half in counseling coordination of care

## 2018-12-01 NOTE — Progress Notes (Signed)
Subjective:    Patient ID: Sheila Morales, female    DOB: 1950-08-03, 68 y.o.   MRN: [id number]  COPD She complains of shortness of breath. There is no cough or wheezing. Pertinent negatives include no appetite change, ear pain, fever, headaches, postnasal drip, rhinorrhea, sneezing, sore throat or trouble swallowing. Her past medical history is significant for COPD.  Shortness of Breath Associated symptoms include a rash. Pertinent negatives include no ear pain, fever, headaches, leg swelling, rhinorrhea, sore throat, vomiting or wheezing. Her past medical history is significant for COPD.     ROV 05/13/17 --68 year old smoker with severe obstructive lung disease identified by pulmonary function testing.  Also with a history of lupus, reflux, IBS. She started biologic for her SLE w Dr Trudie Reed.  At her last visit we tried starting Anoro to see if she would benefit.  She also tried taking Chantix to assist with smoking cessation.  Unfortunately she had GI side effects with the Chantix and had to stop it after about 3 weeks.  She is currently smoking approximately 1 pk a day. She believes that she is benefiting from the Anoro - breathing a bit better. She has some cough, non-prod.  She is still working.   ROV 07/29/17 --this is a follow-up visit for patient with a history of tobacco use and associated severe obstructive lung disease.  She also has SLE, esophageal reflux and irritable bowel syndrome.  She is followed by Dr. Trudie Reed and is being managed on belimumab (Benlysta), Plaquenil, Colestid. We have been managing her w Anoro. She has a hard time telling whether it is helping her much. She uses albuterol occasionally, needed it when she worked in the yard recently.  No pred since last time. She is smoking about 15 cig a day.   She has been dealing with an abscessed tooth, root canal.   ROV 02/05/18 --68 year old smoker whom we follow for severe COPD.  She also has lupus and inflammatory bowel disease on  immunosuppressive therapy (Plaquenil, previously Benlysta).  We performed bronchoscopy in July 2019 to evaluate some bilateral upper lobe groundglass change.  Culture data was all negative.  Infiltrates cleared on subsequent imaging, question RB-ILD or pneumonitis related to immunosuppression.  At her last visit we changed Anoro to Spiriva for cost reasons.  We tried changing her Anoro to spiriva - she preferred the Anoro and she is back on it. No flares since last time. She uses albuterol 1-2x a week. She is smoking about 10 a day. She is planning to begin decreasing systematically. She needs her flu shot. Her PNA shots are UTD.   ROV 12/01/2018 --68 year old woman with tobacco use and associated severe COPD.  She also has SLE, inflammatory bowel disease on immunosuppressive therapy.  We have followed her for resolved groundglass pulmonary infiltrates, culture negative on bronchoscopy, question pneumonitis related to prior biologic therapy.  She has been maintained on Anoro, uses albuterol approximately . She has stable exertional SOB, trouble with inclines. She can walk on a treadmill. Minimal cough, occasional wheeze at night. Remains on Anoro. She has albuterol, uses about 2-3x a week. No flares.   She is smoking less than half a pack / day.     Review of Systems  Constitutional: Negative for appetite change, fever and unexpected weight change.  HENT: Negative for congestion, dental problem, ear pain, nosebleeds, postnasal drip, rhinorrhea, sinus pressure, sneezing, sore throat and trouble swallowing.   Eyes: Negative for redness and itching.  Respiratory: Positive for  shortness of breath. Negative for cough, chest tightness and wheezing.   Cardiovascular: Negative for palpitations and leg swelling.  Gastrointestinal: Negative for nausea and vomiting.  Genitourinary: Negative for dysuria.  Musculoskeletal: Negative for joint swelling.  Skin: Positive for rash.  Neurological: Negative for  headaches.  Hematological: Does not bruise/bleed easily.  Psychiatric/Behavioral: Negative for dysphoric mood. The patient is not nervous/anxious.     Past Medical History:  Diagnosis Date  . Acute respiratory failure with hypoxia ([place]) 11/05/2017  . Arthritis   . Colitis, ischemic ([place])   . External hemorrhoids   . H/O: rheumatic fever   . IBS (irritable bowel syndrome)   . Personal history colonic adenoma 03/25/2008   06/2007 right sided adenoma and 2 right hyperplastic polyps    . Restless leg syndrome    questionable  . Systemic lupus erythematosus ([place])   . Tubular adenoma of colon   . 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 Stroke Mother        Deceased, 29s  . Atrial fibrillation Father   . Colon cancer Father   . Other Sister        Pick's disease, deceased  . Healthy Daughter      Social History   Socioeconomic History  . Marital status: Married    Spouse name: Not on file  . Number of children: 2  . Years of education: 60  . Highest education level: Not on file  Occupational History  . Occupation: Education administrator business  . Occupation: caregiver  Social Needs  . Financial resource strain: Not on file  . Food insecurity    Worry: Not on file    Inability: Not on file  . Transportation needs    Medical: Not on file    Non-medical: Not on file  Tobacco Use  . Smoking status: Current Every Day Smoker    Packs/day: 0.25    Years: 30.00    Pack years: 7.50    Types: Cigarettes  . Smokeless tobacco: Never Used  . Tobacco comment: Declined info  Substance and Sexual Activity  . Alcohol use: Yes    Alcohol/week: 5.0 standard drinks    Types: 5 Standard drinks or equivalent per week    Comment: 2 glasses of wine nightly, 4 beers nightly on weekend  . Drug use: No  . Sexual activity: Yes  Lifestyle  . Physical activity    Days per week: Not on file    Minutes per session: Not on file  . Stress: Not on  file  Relationships  . Social Herbalist on phone: Not on file    Gets together: Not on file    Attends religious service: Not on file    Active member of club or organization: Not on file    Attends meetings of clubs or organizations: Not on file    Relationship status: Not on file  . Intimate partner violence    Fear of current or ex partner: Not on file    Emotionally abused: Not on file    Physically abused: Not on file    Forced sexual activity: Not on file  Other Topics Concern  . Not on file  Social History Narrative   HSG. Married '71. 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      formerly worked  Architect, Museum/gallery conservator, currently does home care, house clearing    No Known Allergies   Outpatient Medications Prior to Visit  Medication Sig Dispense Refill  . albuterol (VENTOLIN HFA) 108 (90 Base) MCG/ACT inhaler Inhale 1-2 puffs into the lungs every 6 (six) hours as needed for wheezing or shortness of breath. 18 Inhaler 1  . AMBULATORY NON FORMULARY MEDICATION Medication Name: GI cocktail 5-10 mL every 4-6 hours as needed for pain 450 mL 1  . ANORO ELLIPTA 62.5-25 MCG/INH AEPB INHALE 1 PUFF BY MOUTH EVERY DAY 60 each 1  . aspirin 81 MG tablet Take 81 mg by mouth daily.    . cholecalciferol (VITAMIN D) 1000 UNITS tablet Take 1,000 Units by mouth daily.      Marland Kitchen dicyclomine (BENTYL) 10 MG capsule TAKE 1 CAPSULE (10 MG TOTAL) BY MOUTH 4 (FOUR) TIMES DAILY - BEFORE MEALS AND AT BEDTIME. 90 capsule 0  . folic acid (FOLVITE) 1 MG tablet Take 1 mg by mouth daily.    Marland Kitchen gabapentin (NEURONTIN) 300 MG capsule Take 1 capsule (300 mg total) by mouth 3 (three) times daily. 2 po qam, 2 po afternoon, 3 po qhs (Patient taking differently: Take 600-900 mg by mouth See admin instructions. 600 mg every morning & afternoon, 900 mg at bedtime) 630 capsule 1  . glucosamine-chondroitin 500-400 MG  tablet Take 2 tablets by mouth 2 (two) times daily.    Marland Kitchen guaiFENesin-dextromethorphan (ROBITUSSIN DM) 100-10 MG/5ML syrup Take 5 mLs by mouth every 4 (four) hours as needed for cough. 118 mL 0  . hydroxychloroquine (PLAQUENIL) 200 MG tablet Take 200 mg by mouth 2 (two) times daily.    . hyoscyamine (LEVSIN SL) 0.125 MG SL tablet Place 1 tablet (0.125 mg total) under the tongue every 4 (four) hours as needed. 30 tablet 0  . methotrexate 2.5 MG tablet Take by mouth once a week.    . MULTIPLE VITAMIN PO Take 1 tablet by mouth daily.      . Omega-3 1000 MG CAPS Take 1,000 mg by mouth daily.    . pantoprazole (PROTONIX) 40 MG tablet Take 1 tablet (40 mg total) by mouth 2 (two) times daily before a meal. 180 tablet 0  . RESTASIS 0.05 % ophthalmic emulsion INSTILL 1 DROP INTO BOTH EYES TWICE A DAY  6  . triamcinolone cream (KENALOG) 0.1 % APPLY TO AFFECTED AREA TWICE A DAY FOR 2 WEEK AS DIRECTED  0  . vitamin C (ASCORBIC ACID) 500 MG tablet Take 500 mg by mouth daily.      . vitamin E 1000 UNIT capsule Take 1,000 Units by mouth daily.    Marland Kitchen zolpidem (AMBIEN) 5 MG tablet Take 1 tablet (5 mg total) by mouth at bedtime as needed. 30 tablet 0   No facility-administered medications prior to visit.         Objective:   Physical Exam Vitals:   12/01/18 1513  BP: (!) 142/82  Pulse: 61  SpO2: 95%  Weight: 129 lb (58.5 kg)  Height: 5' 2.5" (1.588 m)   Gen: Pleasant, well-nourished, in no distress,  normal affect  ENT: No lesions,  mouth clear,  oropharynx clear, no postnasal drip  Neck: No JVD, no Stridor  Lungs: No use of accessory muscles,  clear without rales or rhonchi  Cardiovascular: RRR, heart sounds normal, no murmur or gallops, no peripheral edema  Musculoskeletal: No deformities, no cyanosis or clubbing  Neuro: alert, non focal  Skin: no rash  Assessment & Plan:  TOBACCO USE DISORDER/SMOKER-SMOKING CESSATION DISCUSSED Discussed her tobacco use and her smoking cessation  efforts with her today.  She does want to stop.  Currently smoking about 5 to 10 cigarettes daily.  Of asked her to cut down in preparation for a trip that she is taking to visit family.  When she makes that trip she is going to try to stop altogether.  COPD (chronic obstructive pulmonary disease) (HCC) Continue Anoro once daily. Keep your albuterol available use 2 puffs if needed for shortness of breath, chest tightness, wheezing.  If the Proventil does not help you as much as your previous prescription then we could consider trying to change it going forward.  Please let us know. Get the flu shot in the fall. Follow with Dr Lamonte Sakai in 6 months or sooner if you have any problems  Baltazar Apo, MD, PhD 12/01/2018, 3:52 PM [facility] [phone] or if no answer [phone]

## 2018-12-02 ENCOUNTER — Telehealth: Payer: Self-pay | Admitting: Internal Medicine

## 2018-12-02 NOTE — Telephone Encounter (Signed)
I spoke with Sheila Morales and she said the nausea feels better now. I told her to try some ginger ale which she has. She said she started the purge early this AM. She said the abdominal pain has lessened. She will call us back if she has more problems.

## 2018-12-03 ENCOUNTER — Other Ambulatory Visit: Payer: Self-pay | Admitting: Emergency Medicine

## 2018-12-14 DIAGNOSIS — M2550 Pain in unspecified joint: Secondary | ICD-10-CM | POA: Diagnosis not present

## 2018-12-14 DIAGNOSIS — M328 Other forms of systemic lupus erythematosus: Secondary | ICD-10-CM | POA: Diagnosis not present

## 2018-12-14 DIAGNOSIS — L932 Other local lupus erythematosus: Secondary | ICD-10-CM | POA: Diagnosis not present

## 2018-12-14 DIAGNOSIS — Z79899 Other long term (current) drug therapy: Secondary | ICD-10-CM | POA: Diagnosis not present

## 2018-12-15 ENCOUNTER — Telehealth: Payer: Self-pay | Admitting: Internal Medicine

## 2018-12-15 DIAGNOSIS — M328 Other forms of systemic lupus erythematosus: Secondary | ICD-10-CM | POA: Diagnosis not present

## 2018-12-15 NOTE — Telephone Encounter (Signed)
Patient reports that she has been taking po fiber.  She is now very bloated and has had some vomiting over night.  She is asking if she can try dried apples and prunes instead of the fiber.  She is advised that she can.  If she decides to resume the fiber she should start off with one tbsp and very slowly add an additional 1/2 tbsp or tbsp very slowly no more than weekly  to get her to the 3 tbsp Dr. Carlean Purl recommended.  She will call back for any additional questions or concerns.

## 2018-12-21 ENCOUNTER — Other Ambulatory Visit: Payer: Self-pay | Admitting: Internal Medicine

## 2018-12-21 MED ORDER — DICYCLOMINE HCL 10 MG PO CAPS: 10.0000 mg | ORAL_CAPSULE | Freq: Three times a day (TID) | ORAL | 0 refills | Status: DC

## 2018-12-21 NOTE — Telephone Encounter (Signed)
Requested medication (s) are due for refill today: yes  Requested medication (s) are on the active medication list: yes   Last refill:   Future visit scheduled: yes Notes to clinic:  Review for refill  Requested Prescriptions  Pending Prescriptions Disp Refills   dicyclomine (BENTYL) 10 MG capsule 90 capsule 0    Sig: Take 1 capsule (10 mg total) by mouth 4 (four) times daily -  before meals and at bedtime.     Gastroenterology:  Antispasmodic Agents Passed - 12/21/2018 12:21 PM      Passed - Last Heart Rate in normal range    Pulse Readings from Last 1 Encounters:  12/01/18 61         Passed - Valid encounter within last 12 months    Recent Outpatient Visits          1 month ago Left wrist pain   [place], James W, MD   2 months ago Irritable bowel syndrome with constipation   [place], [place]   5 months ago COPD with acute lower respiratory infection [facility])   [place], Elizabeth A, MD   7 months ago COPD with acute lower respiratory infection [facility])   [place], Elizabeth A, MD   9 months ago Chronic obstructive pulmonary disease, unspecified COPD type [facility])   [facility] -Chuck Hint, MD

## 2018-12-21 NOTE — Telephone Encounter (Signed)
Medication Refill - Medication:  dicyclomine (BENTYL) 10 MG capsule   Has the patient contacted their pharmacy?  Advised to call office.   Preferred Pharmacy (with phone number or street name):  [id number] [address], [place] [phone] (Phone) [phone] (Fax)   Agent: Please be advised that RX refills may take up to 3 business days. We ask that you follow-up with your pharmacy.

## 2019-01-06 ENCOUNTER — Other Ambulatory Visit: Payer: Self-pay | Admitting: Internal Medicine

## 2019-02-08 ENCOUNTER — Telehealth (INDEPENDENT_AMBULATORY_CARE_PROVIDER_SITE_OTHER): Payer: PPO | Admitting: Neurology

## 2019-02-08 ENCOUNTER — Other Ambulatory Visit: Payer: Self-pay

## 2019-02-08 ENCOUNTER — Encounter: Payer: Self-pay | Admitting: Neurology

## 2019-02-08 VITALS — Ht 62.5 in | Wt 118.0 lb

## 2019-02-08 DIAGNOSIS — G629 Polyneuropathy, unspecified: Secondary | ICD-10-CM | POA: Diagnosis not present

## 2019-02-08 NOTE — Progress Notes (Signed)
    Virtual Visit via Telephone Note The purpose of this virtual visit is to provide medical care while limiting exposure to the novel coronavirus.    Consent was obtained for phone visit:  Yes.   Answered questions that patient had about telehealth interaction:  Yes.   I discussed the limitations, risks, security and privacy concerns of performing an evaluation and management service by telephone. I also discussed with the patient that there may be a patient responsible charge related to this service. The patient expressed understanding and agreed to proceed.  Pt location: Home Physician Location: office Name of referring provider:  Hoyt Koch, * I connected with .Sheila Morales at patients initiation/request on 02/08/2019 at  1:30 PM EDT by telephone and verified that I am speaking with the correct person using two identifiers.  Pt MRN:  [id number] Pt DOB:  [birth year]   History of Present Illness:  This is a 68 year-old female returning for follow-up of small fiber neuropathy.  Symptoms are well-controlled on gabapentin 600mg  and 900mg  at bedtime. There has been no progression of tingling, numbness, or burning pain.   She had one fall a few months ago.  No imbalance or weakness.  She has cut back to drinking one glass of wine a few nights per week and is trying to cut back even more.     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  Symptoms are controlled on gabapentin 600mg  in the morning and 900mg  at bedtime.  She had not needed any dose adjustment in the past 2 years.  She is welcome to return to see me for ongoing refills or may request this through her PCP given symptoms are well-controlled and stable.  Follow Up Instructions:   I discussed the assessment and treatment plan with the patient. The patient was provided an opportunity to ask questions and all were answered. The patient agreed with the plan and demonstrated an understanding of the  instructions.   The patient was advised to call back or seek an in-person evaluation if the symptoms worsen or if the condition fails to improve as anticipated.   Total Time spent in visit with the patient was:  10 min, of which 100% of the time was spent in counseling and/or coordinating care.   Pt understands and agrees with the plan of care outlined.     Alda Berthold, DO

## 2019-02-11 ENCOUNTER — Other Ambulatory Visit: Payer: Self-pay

## 2019-02-11 DIAGNOSIS — Z20822 Contact with and (suspected) exposure to covid-19: Secondary | ICD-10-CM

## 2019-02-11 DIAGNOSIS — Z20828 Contact with and (suspected) exposure to other viral communicable diseases: Secondary | ICD-10-CM | POA: Diagnosis not present

## 2019-02-13 LAB — NOVEL CORONAVIRUS, NAA: SARS-CoV-2, NAA: NOT DETECTED

## 2019-02-17 ENCOUNTER — Telehealth: Payer: Self-pay | Admitting: *Deleted

## 2019-02-17 NOTE — Telephone Encounter (Signed)
This does not affect ability to get flu shot but we cannot do this at our office within 14 days of exposure to positive.

## 2019-02-17 NOTE — Telephone Encounter (Signed)
Copied from [place] [id number]. Topic: General - Other >> Feb 17, 2019  9:24 AM Carolyn Stare wrote: Pt tested neg for COVID last week but her husband was positive and she is asking when and if she should take the flu shot

## 2019-02-17 NOTE — Telephone Encounter (Signed)
Pt informed of below.  

## 2019-03-15 DIAGNOSIS — Z79899 Other long term (current) drug therapy: Secondary | ICD-10-CM | POA: Diagnosis not present

## 2019-03-15 DIAGNOSIS — H35013 Changes in retinal vascular appearance, bilateral: Secondary | ICD-10-CM | POA: Diagnosis not present

## 2019-03-16 DIAGNOSIS — M328 Other forms of systemic lupus erythematosus: Secondary | ICD-10-CM | POA: Diagnosis not present

## 2019-03-16 DIAGNOSIS — M2550 Pain in unspecified joint: Secondary | ICD-10-CM | POA: Diagnosis not present

## 2019-03-16 DIAGNOSIS — L932 Other local lupus erythematosus: Secondary | ICD-10-CM | POA: Diagnosis not present

## 2019-03-16 DIAGNOSIS — Z79899 Other long term (current) drug therapy: Secondary | ICD-10-CM | POA: Diagnosis not present

## 2019-03-29 ENCOUNTER — Other Ambulatory Visit (INDEPENDENT_AMBULATORY_CARE_PROVIDER_SITE_OTHER): Payer: PPO

## 2019-03-29 ENCOUNTER — Ambulatory Visit (INDEPENDENT_AMBULATORY_CARE_PROVIDER_SITE_OTHER): Payer: PPO | Admitting: Internal Medicine

## 2019-03-29 ENCOUNTER — Telehealth: Payer: Self-pay

## 2019-03-29 ENCOUNTER — Encounter: Payer: Self-pay | Admitting: Internal Medicine

## 2019-03-29 ENCOUNTER — Other Ambulatory Visit: Payer: Self-pay

## 2019-03-29 VITALS — BP 110/70 | HR 77 | Temp 98.0°F | Ht 62.5 in | Wt 123.0 lb

## 2019-03-29 DIAGNOSIS — G609 Hereditary and idiopathic neuropathy, unspecified: Secondary | ICD-10-CM

## 2019-03-29 DIAGNOSIS — E2839 Other primary ovarian failure: Secondary | ICD-10-CM

## 2019-03-29 DIAGNOSIS — J449 Chronic obstructive pulmonary disease, unspecified: Secondary | ICD-10-CM | POA: Diagnosis not present

## 2019-03-29 DIAGNOSIS — K581 Irritable bowel syndrome with constipation: Secondary | ICD-10-CM

## 2019-03-29 DIAGNOSIS — J410 Simple chronic bronchitis: Secondary | ICD-10-CM

## 2019-03-29 DIAGNOSIS — Z1159 Encounter for screening for other viral diseases: Secondary | ICD-10-CM | POA: Diagnosis not present

## 2019-03-29 DIAGNOSIS — M329 Systemic lupus erythematosus, unspecified: Secondary | ICD-10-CM | POA: Diagnosis not present

## 2019-03-29 DIAGNOSIS — Z0000 Encounter for general adult medical examination without abnormal findings: Secondary | ICD-10-CM | POA: Diagnosis not present

## 2019-03-29 DIAGNOSIS — Z23 Encounter for immunization: Secondary | ICD-10-CM

## 2019-03-29 DIAGNOSIS — F5101 Primary insomnia: Secondary | ICD-10-CM | POA: Diagnosis not present

## 2019-03-29 DIAGNOSIS — Z1231 Encounter for screening mammogram for malignant neoplasm of breast: Secondary | ICD-10-CM

## 2019-03-29 LAB — LIPID PANEL
Cholesterol: 148 mg/dL (ref 0–200)
HDL: 63.6 mg/dL (ref >39.00)
LDL Cholesterol: 71 mg/dL (ref 0–99)
NonHDL: 84.4
Total CHOL/HDL Ratio: 2
Triglycerides: 66 mg/dL (ref 0.0–149.0)
VLDL: 13.2 mg/dL (ref 0.0–40.0)

## 2019-03-29 LAB — COMPREHENSIVE METABOLIC PANEL
ALT: 27 U/L (ref 0–35)
AST: 33 U/L (ref 0–37)
Albumin: 3.7 g/dL (ref 3.5–5.2)
Alkaline Phosphatase: 88 U/L (ref 39–117)
BUN: 13 mg/dL (ref 6–23)
CO2: 27 mEq/L (ref 19–32)
Calcium: 9 mg/dL (ref 8.4–10.5)
Chloride: 101 mEq/L (ref 96–112)
Creatinine, Ser: 0.7 mg/dL (ref 0.40–1.20)
GFR: 82.87 mL/min (ref >60.00)
Glucose, Bld: 85 mg/dL (ref 70–99)
Potassium: 3.9 mEq/L (ref 3.5–5.1)
Sodium: 136 mEq/L (ref 135–145)
Total Bilirubin: 0.6 mg/dL (ref 0.2–1.2)
Total Protein: 6.8 g/dL (ref 6.0–8.3)

## 2019-03-29 LAB — CBC
HCT: 34.9 % — ABNORMAL LOW (ref 36.0–46.0)
Hemoglobin: 11.6 g/dL — ABNORMAL LOW (ref 12.0–15.0)
MCHC: 33.4 g/dL (ref 30.0–36.0)
MCV: 93.7 fl (ref 78.0–100.0)
Platelets: 167 10*3/uL (ref 150.0–400.0)
RBC: 3.73 Mil/uL — ABNORMAL LOW (ref 3.87–5.11)
RDW: 16.5 % — ABNORMAL HIGH (ref 11.5–15.5)
WBC: 5.6 10*3/uL (ref 4.0–10.5)

## 2019-03-29 LAB — TSH: TSH: 1.45 u[IU]/mL (ref 0.35–4.50)

## 2019-03-29 LAB — VITAMIN B12: Vitamin B-12: 908 pg/mL (ref 211–911)

## 2019-03-29 LAB — VITAMIN D 25 HYDROXY (VIT D DEFICIENCY, FRACTURES): VITD: 78.74 ng/mL (ref 30.00–100.00)

## 2019-03-29 NOTE — Assessment & Plan Note (Signed)
Flu shot up to date. Pneumonia complete. Shingrix counseled, due. Tetanus given today due 2030. Colonoscopy due 2026. Mammogram due ordered today, pap smear aged out and dexa due ordered today. Counseled about sun safety and mole surveillance. Counseled about the dangers of distracted driving. Given 10 year screening recommendations.

## 2019-03-29 NOTE — Assessment & Plan Note (Signed)
With constipation and taking otc medications.

## 2019-03-29 NOTE — Telephone Encounter (Signed)
Copied from [place] [id number]. Topic: General - Other >> Mar 29, 2019  1:33 PM Leward Quan A wrote: Reason for CRM: Patient called to say that she forgot to ask Dr Sharlet Salina a question during her visit. Asking if she can get a call back about her hands turning white like there is no circulation in the fingers on both hands. Say that in order to get the blood flowing she have to place them under running hot water. Patient would like a call back if there is something that Dr Sharlet Salina can do for her. Please call Ph# [phone]

## 2019-03-29 NOTE — Assessment & Plan Note (Signed)
Taking ambien still. Addressed risk of falls and memory problems and she is willing to continue given increase in QOL.

## 2019-03-29 NOTE — Assessment & Plan Note (Signed)
Still seeing pulmonary and smoking more recently. Stable symptoms.

## 2019-03-29 NOTE — Progress Notes (Signed)
Subjective:   Patient ID: Sheila Morales, female    DOB: [birth year], 68 y.o.   MRN: [id number]  HPI Here for medicare wellness and physical, no new complaints. Please see A/P for status and treatment of chronic medical problems.   Diet: heart healthy Physical activity: sedentary Depression/mood screen: negative Hearing: bilateral aids, doing okay Visual acuity: grossly normal, performs annual eye exam  ADLs: capable Fall risk: none Home safety: good Cognitive evaluation: intact to orientation, naming, recall and repetition EOL planning: adv directives discussed    Office Visit from 02/24/2017 in [place]  PHQ-2 Total Score  0        I have personally reviewed and have noted 1. The patient's medical and social history - reviewed today no changes 2. Their use of alcohol, tobacco or illicit drugs 3. Their current medications and supplements 4. The patient's functional ability including ADL's, fall risks, home safety risks and hearing or visual impairment. 5. Diet and physical activities 6. Evidence for depression or mood disorders 7. Care team reviewed and updated  Patient Care Team: Hoyt Koch, MD as PCP - General (Internal Medicine) Gavin Pound, MD (Rheumatology) Gus Height, MD (Inactive) (Obstetrics and Gynecology) Alda Berthold, DO as Consulting Physician (Neurology) Past Medical History:  Diagnosis Date  . Acute respiratory failure with hypoxia ([place]) 11/05/2017  . Arthritis   . Colitis, ischemic ([place])   . External hemorrhoids   . H/O: rheumatic fever   . IBS (irritable bowel syndrome)   . Personal history colonic adenoma 03/25/2008   06/2007 right sided adenoma and 2 right hyperplastic polyps    . Restless leg syndrome    questionable  . Systemic lupus erythematosus ([place])   . Tubular adenoma of colon   . 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 CHOLECYSTECTOMY    .  CHOLECYSTECTOMY, LAPAROSCOPIC    . COLONOSCOPY    . ESOPHAGOGASTRODUODENOSCOPY    . lesion, vulva excision    . TONSILLECTOMY    . VIDEO BRONCHOSCOPY Bilateral 11/06/2017   Procedure: VIDEO BRONCHOSCOPY WITHOUT FLUORO;  Surgeon: Marshell Garfinkel, MD;  Location: [facility];  Service: Cardiopulmonary;  Laterality: Bilateral;   Family History  Problem Relation Age of Onset  . Stroke Mother        Deceased, 21s  . Atrial fibrillation Father   . Colon cancer Father   . Other Sister        Pick's disease, deceased  . Healthy Daughter      Review of Systems  Constitutional: Negative.   HENT: Negative.   Eyes: Negative.   Respiratory: Positive for cough. Negative for chest tightness and shortness of breath.        Chronic stable  Cardiovascular: Negative for chest pain, palpitations and leg swelling.  Gastrointestinal: Positive for abdominal pain and constipation. Negative for abdominal distention, diarrhea, nausea and vomiting.  Musculoskeletal: Positive for arthralgias.  Skin: Negative.   Neurological: Negative.   Psychiatric/Behavioral: Negative.     Objective:  Physical Exam Constitutional:      Appearance: She is well-developed.  HENT:     Head: Normocephalic and atraumatic.  Neck:     Musculoskeletal: Normal range of motion.  Cardiovascular:     Rate and Rhythm: Normal rate and regular rhythm.  Pulmonary:     Effort: Pulmonary effort is normal. No respiratory distress.     Breath sounds: Normal breath sounds. No wheezing  or rales.  Abdominal:     General: Bowel sounds are normal. There is no distension.     Palpations: Abdomen is soft.     Tenderness: There is no abdominal tenderness. There is no rebound.  Skin:    General: Skin is warm and dry.  Neurological:     Mental Status: She is alert and oriented to person, place, and time.     Coordination: Coordination normal.     Vitals:   03/29/19 0819  BP: 110/70  Pulse: 77  Temp: 98 F (36.7 C)  TempSrc:  Oral  SpO2: 92%  Weight: 123 lb (55.8 kg)  Height: 5' 2.5" (1.588 m)    This visit occurred during the SARS-CoV-2 public health emergency.  Safety protocols were in place, including screening questions prior to the visit, additional usage of staff PPE, and extensive cleaning of exam room while observing appropriate contact time as indicated for disinfecting solutions.   Assessment & Plan:  Tdap and flu shot given at visit

## 2019-03-29 NOTE — Assessment & Plan Note (Signed)
Taking plaquenil and methotrexate and still seeing rheumatology.

## 2019-03-29 NOTE — Patient Instructions (Addendum)
You can also try sennakot-d for the constipation daily which is over the counter.    Health Maintenance, Female Adopting a healthy lifestyle and getting preventive care are important in promoting health and wellness. Ask your health care provider about:  The right schedule for you to have regular tests and exams.  Things you can do on your own to prevent diseases and keep yourself healthy. What should I know about diet, weight, and exercise? Eat a healthy diet   Eat a diet that includes plenty of vegetables, fruits, low-fat dairy products, and lean protein.  Do not eat a lot of foods that are high in solid fats, added sugars, or sodium. Maintain a healthy weight Body mass index (BMI) is used to identify weight problems. It estimates body fat based on height and weight. Your health care provider can help determine your BMI and help you achieve or maintain a healthy weight. Get regular exercise Get regular exercise. This is one of the most important things you can do for your health. Most adults should:  Exercise for at least 150 minutes each week. The exercise should increase your heart rate and make you sweat (moderate-intensity exercise).  Do strengthening exercises at least twice a week. This is in addition to the moderate-intensity exercise.  Spend less time sitting. Even light physical activity can be beneficial. Watch cholesterol and blood lipids Have your blood tested for lipids and cholesterol at 69 years of age, then have this test every 5 years. Have your cholesterol levels checked more often if:  Your lipid or cholesterol levels are high.  You are older than 69 years of age.  You are at high risk for heart disease. What should I know about cancer screening? Depending on your health history and family history, you may need to have cancer screening at various ages. This may include screening for:  Breast cancer.  Cervical cancer.  Colorectal cancer.  Skin  cancer.  Lung cancer. What should I know about heart disease, diabetes, and high blood pressure? Blood pressure and heart disease  High blood pressure causes heart disease and increases the risk of stroke. This is more likely to develop in people who have high blood pressure readings, are of African descent, or are overweight.  Have your blood pressure checked: ? Every 3-5 years if you are 41-54 years of age. ? Every year if you are 30 years old or older. Diabetes Have regular diabetes screenings. This checks your fasting blood sugar level. Have the screening done:  Once every three years after age 60 if you are at a normal weight and have a low risk for diabetes.  More often and at a younger age if you are overweight or have a high risk for diabetes. What should I know about preventing infection? Hepatitis B If you have a higher risk for hepatitis B, you should be screened for this virus. Talk with your health care provider to find out if you are at risk for hepatitis B infection. Hepatitis C Testing is recommended for:  Everyone born from [birth year] through 1966.  Anyone with known risk factors for hepatitis C. Sexually transmitted infections (STIs)  Get screened for STIs, including gonorrhea and chlamydia, if: ? You are sexually active and are younger than 69 years of age. ? You are older than 69 years of age and your health care provider tells you that you are at risk for this type of infection. ? Your sexual activity has changed since you were last  screened, and you are at increased risk for chlamydia or gonorrhea. Ask your health care provider if you are at risk.  Ask your health care provider about whether you are at high risk for HIV. Your health care provider may recommend a prescription medicine to help prevent HIV infection. If you choose to take medicine to prevent HIV, you should first get tested for HIV. You should then be tested every 3 months for as long as you are taking  the medicine. Pregnancy  If you are about to stop having your period (premenopausal) and you may become pregnant, seek counseling before you get pregnant.  Take 400 to 800 micrograms (mcg) of folic acid every day if you become pregnant.  Ask for birth control (contraception) if you want to prevent pregnancy. Osteoporosis and menopause Osteoporosis is a disease in which the bones lose minerals and strength with aging. This can result in bone fractures. If you are 73 years old or older, or if you are at risk for osteoporosis and fractures, ask your health care provider if you should:  Be screened for bone loss.  Take a calcium or vitamin D supplement to lower your risk of fractures.  Be given hormone replacement therapy (HRT) to treat symptoms of menopause. Follow these instructions at home: Lifestyle  Do not use any products that contain nicotine or tobacco, such as cigarettes, e-cigarettes, and chewing tobacco. If you need help quitting, ask your health care provider.  Do not use street drugs.  Do not share needles.  Ask your health care provider for help if you need support or information about quitting drugs. Alcohol use  Do not drink alcohol if: ? Your health care provider tells you not to drink. ? You are pregnant, may be pregnant, or are planning to become pregnant.  If you drink alcohol: ? Limit how much you use to 0-1 drink a day. ? Limit intake if you are breastfeeding.  Be aware of how much alcohol is in your drink. In the [place] one drink equals one 12 oz bottle of beer (355 mL), one 5 oz glass of wine (148 mL), or one 1 oz glass of hard liquor (44 mL). General instructions  Schedule regular health, dental, and eye exams.  Stay current with your vaccines.  Tell your health care provider if: ? You often feel depressed. ? You have ever been abused or do not feel safe at home. Summary  Adopting a healthy lifestyle and getting preventive care are important in  promoting health and wellness.  Follow your health care provider's instructions about healthy diet, exercising, and getting tested or screened for diseases.  Follow your health care provider's instructions on monitoring your cholesterol and blood pressure. This information is not intended to replace advice given to you by your health care provider. Make sure you discuss any questions you have with your health care provider. Document Released: 10/22/2010 Document Revised: 04/01/2018 Document Reviewed: 04/01/2018 Elsevier Patient Education  2020 [employer].

## 2019-03-29 NOTE — Assessment & Plan Note (Addendum)
With chronic cough, smoking cessation discussed and not amenable to quit attempt at this time. She has been smoking more due to pandemic and is working on getting back to normal amount.

## 2019-03-30 LAB — HEPATITIS C ANTIBODY
Hepatitis C Ab: NONREACTIVE
SIGNAL TO CUT-OFF: 0.03 (ref <1.00)

## 2019-03-30 NOTE — Telephone Encounter (Signed)
Stopping smoking can help. Smoking causes the small blood vessels in the hands and feet to shrink.

## 2019-04-06 ENCOUNTER — Other Ambulatory Visit: Payer: Self-pay

## 2019-04-06 ENCOUNTER — Ambulatory Visit (INDEPENDENT_AMBULATORY_CARE_PROVIDER_SITE_OTHER)
Admission: RE | Admit: 2019-04-06 | Discharge: 2019-04-06 | Disposition: A | Discharge status: Home / Self Care | Payer: PPO | Source: Ambulatory Visit | Attending: Internal Medicine | Admitting: Internal Medicine

## 2019-04-06 DIAGNOSIS — E2839 Other primary ovarian failure: Secondary | ICD-10-CM | POA: Diagnosis not present

## 2019-05-10 ENCOUNTER — Other Ambulatory Visit: Payer: Self-pay | Admitting: Internal Medicine

## 2019-05-11 ENCOUNTER — Telehealth: Payer: Self-pay | Admitting: Internal Medicine

## 2019-05-11 MED ORDER — GABAPENTIN 300 MG PO CAPS: 300.0000 mg | ORAL_CAPSULE | Freq: Three times a day (TID) | ORAL | 1 refills | Status: DC

## 2019-05-11 NOTE — Telephone Encounter (Signed)
RX REFILL gabapentin (NEURONTIN) 300 MG capsule  PHARMACY [id number] [place], [place] - [address] Phone:  [phone]  Fax:  [phone]        Patient is requesting PCP to prescribe this medication instead of her "leg doctor"

## 2019-05-21 ENCOUNTER — Ambulatory Visit: Payer: PPO

## 2019-06-15 DIAGNOSIS — M328 Other forms of systemic lupus erythematosus: Secondary | ICD-10-CM | POA: Diagnosis not present

## 2019-06-15 DIAGNOSIS — L932 Other local lupus erythematosus: Secondary | ICD-10-CM | POA: Diagnosis not present

## 2019-06-15 DIAGNOSIS — M2550 Pain in unspecified joint: Secondary | ICD-10-CM | POA: Diagnosis not present

## 2019-06-15 DIAGNOSIS — Z6820 Body mass index (BMI) 20.0-20.9, adult: Secondary | ICD-10-CM | POA: Diagnosis not present

## 2019-06-15 DIAGNOSIS — Z79899 Other long term (current) drug therapy: Secondary | ICD-10-CM | POA: Diagnosis not present

## 2019-07-01 DIAGNOSIS — Z961 Presence of intraocular lens: Secondary | ICD-10-CM | POA: Diagnosis not present

## 2019-07-01 DIAGNOSIS — H04123 Dry eye syndrome of bilateral lacrimal glands: Secondary | ICD-10-CM | POA: Diagnosis not present

## 2019-07-08 ENCOUNTER — Telehealth: Payer: Self-pay | Admitting: Internal Medicine

## 2019-07-08 DIAGNOSIS — J449 Chronic obstructive pulmonary disease, unspecified: Secondary | ICD-10-CM

## 2019-07-08 NOTE — Progress Notes (Signed)
  Chronic Care Management   Note  07/08/2019 Name: Sheila Morales MRN: [id number] DOB: 08/01/1950  Sheila Morales is a 69 y.o. year old female who is a primary care patient of Hoyt Koch, MD. I reached out to [name] by phone today in response to a referral sent by Sheila Morales PCP, Hoyt Koch, MD.   Sheila Morales was given information about Chronic Care Management services today including:  1. CCM service includes personalized support from designated clinical staff supervised by her physician, including individualized plan of care and coordination with other care providers 2. 24/7 contact phone numbers for assistance for urgent and routine care needs. 3. Service will only be billed when office clinical staff spend 20 minutes or more in a month to coordinate care. 4. Only one practitioner may furnish and bill the service in a calendar month. 5. The patient may stop CCM services at any time (effective at the end of the month) by phone call to the office staff.   Patient agreed to services and verbal consent obtained.   Follow up plan:   Raynicia Dukes [employer] Scheduler

## 2019-07-08 NOTE — Progress Notes (Signed)
  Chronic Care Management   Outreach Note  07/08/2019 Name: Sheila Morales MRN: [id number] DOB: 12-16-1950  Referred by: Hoyt Koch, MD Reason for referral : No chief complaint on file.   An unsuccessful telephone outreach was attempted today. The patient was referred to the pharmacist for assistance with care management and care coordination.   Follow Up Plan:   Raynicia Dukes [employer] Scheduler

## 2019-07-26 NOTE — Addendum Note (Signed)
Addended by: CAIRRIKIER DAVIDSON, Challis Crill M on: 07/26/2019 10:12 PM   Modules accepted: Orders  

## 2019-07-29 ENCOUNTER — Ambulatory Visit (INDEPENDENT_AMBULATORY_CARE_PROVIDER_SITE_OTHER): Payer: PPO

## 2019-07-29 ENCOUNTER — Encounter: Payer: Self-pay | Admitting: Emergency Medicine

## 2019-07-29 ENCOUNTER — Other Ambulatory Visit: Payer: Self-pay

## 2019-07-29 ENCOUNTER — Ambulatory Visit: Payer: PPO | Admitting: Pharmacist

## 2019-07-29 ENCOUNTER — Ambulatory Visit: Payer: PPO | Admitting: Emergency Medicine

## 2019-07-29 VITALS — BP 112/64 | HR 68 | Ht 63.5 in | Wt 117.0 lb

## 2019-07-29 DIAGNOSIS — J449 Chronic obstructive pulmonary disease, unspecified: Secondary | ICD-10-CM

## 2019-07-29 DIAGNOSIS — K581 Irritable bowel syndrome with constipation: Secondary | ICD-10-CM

## 2019-07-29 DIAGNOSIS — Z5181 Encounter for therapeutic drug level monitoring: Secondary | ICD-10-CM | POA: Diagnosis not present

## 2019-07-29 DIAGNOSIS — Z79899 Other long term (current) drug therapy: Secondary | ICD-10-CM | POA: Insufficient documentation

## 2019-07-29 DIAGNOSIS — Z79631 Long term (current) use of antimetabolite agent: Secondary | ICD-10-CM | POA: Insufficient documentation

## 2019-07-29 DIAGNOSIS — M329 Systemic lupus erythematosus, unspecified: Secondary | ICD-10-CM

## 2019-07-29 DIAGNOSIS — G609 Hereditary and idiopathic neuropathy, unspecified: Secondary | ICD-10-CM

## 2019-07-29 DIAGNOSIS — R918 Other nonspecific abnormal finding of lung field: Secondary | ICD-10-CM | POA: Diagnosis not present

## 2019-07-29 NOTE — Assessment & Plan Note (Signed)
She has started methotrexate since her last visit for her autoimmune disease.  I will check a chest x-ray today and follow to ensure no interstitial changes.

## 2019-07-29 NOTE — Assessment & Plan Note (Signed)
Please continue Anoro 1 inhalation once daily. Keep your albuterol available to use 2 puffs up to every 4 hours if needed for shortness of breath, chest tightness, wheezing. Congratulations on decreasing your smoking.  Keep up the good work.  We will help you quit if possible. I believe you would benefit from getting the COVID-19 vaccine when you are comfortable doing so. Follow with Dr Lamonte Sakai in 6 months or sooner if you have any problems

## 2019-07-29 NOTE — Progress Notes (Signed)
Subjective:    Patient ID: Sheila Morales, female    DOB: [birth year], 69 y.o.   MRN: [id number]  COPD She complains of shortness of breath. There is no cough or wheezing. Pertinent negatives include no appetite change, ear pain, fever, headaches, postnasal drip, rhinorrhea, sneezing, sore throat or trouble swallowing. Her past medical history is significant for COPD.  Shortness of Breath Pertinent negatives include no ear pain, fever, headaches, leg swelling, rash, rhinorrhea, sore throat, vomiting or wheezing. Her past medical history is significant for COPD.     ROV 07/29/19 --follow-up visit for 69 year old woman, active smoker (0.5 packs daily) with associated severe COPD.  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  We have been managing her on Anoro.  She uses albuterol approximately 2x a day. She is working on weaning off her cigarettes, currently on 9 cig a day, decreasing every 2 weeks. No flares. She does have some exertional SOB with long walks and hills. She started MTX in the last year.  She is not ready to get the COVID vaccine.    Review of Systems  Constitutional: Negative for appetite change, fever and unexpected weight change.  HENT: Negative for congestion, dental problem, ear pain, nosebleeds, postnasal drip, rhinorrhea, sinus pressure, sneezing, sore throat and trouble swallowing.   Eyes: Negative for redness and itching.  Respiratory: Positive for shortness of breath. Negative for cough, chest tightness and wheezing.   Cardiovascular: Negative for palpitations and leg swelling.  Gastrointestinal: Negative for nausea and vomiting.  Genitourinary: Negative for dysuria.  Musculoskeletal: Negative for joint swelling.       Raynaud's   Skin: Negative for rash.  Neurological: Negative for headaches.  Hematological: Does not  bruise/bleed easily.  Psychiatric/Behavioral: Negative for dysphoric mood. The patient is not nervous/anxious.     Past Medical History:  Diagnosis Date  . Acute respiratory failure with hypoxia ([place]) 11/05/2017  . Arthritis   . Colitis, ischemic ([place])   . External hemorrhoids   . H/O: rheumatic fever   . IBS (irritable bowel syndrome)   . Personal history colonic adenoma 03/25/2008   06/2007 right sided adenoma and 2 right hyperplastic polyps    . Restless leg syndrome    questionable  . Systemic lupus erythematosus ([place])   . Tubular adenoma of colon   . 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 Stroke Mother        Deceased, 19s  . Atrial fibrillation Father   . Colon cancer Father   . Other Sister        Pick's disease, deceased  . Healthy Daughter      Social History   Socioeconomic History  . Marital status: Married    Spouse name: Not on file  . Number of children: 2  . Years of education: 87  . Highest education level: Not on file  Occupational History  . Occupation: Education administrator business  . Occupation: caregiver  Tobacco Use  . Smoking status: Current Every Day Smoker    Packs/day: 0.25    Years: 30.00    Pack years: 7.50    Types: Cigarettes  . Smokeless tobacco: Never Used  . Tobacco comment: Declined info  Substance and Sexual Activity  . Alcohol use: Yes    Alcohol/week: 5.0  standard drinks    Types: 5 Standard drinks or equivalent per week    Comment: 2 glasses of wine nightly, 4 beers nightly on weekend  . Drug use: No  . Sexual activity: Yes  Other Topics Concern  . Not on file  Social History Narrative   HSG. Married '71. 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       One level home with spouse   Right handed   Caffeine - coffee 2-3 cups am   Exercise - treadmill    Education - 12th grade   Social  Determinants of Health   Financial Resource Strain:   . Difficulty of Paying Living Expenses:   Food Insecurity:   . Worried About Charity fundraiser in the Last Year:   . Arboriculturist in the Last Year:   Transportation Needs:   . Film/video editor (Medical):   Marland Kitchen Lack of Transportation (Non-Medical):   Physical Activity:   . Days of Exercise per Week:   . Minutes of Exercise per Session:   Stress:   . Feeling of Stress :   Social Connections:   . Frequency of Communication with Friends and Family:   . Frequency of Social Gatherings with Friends and Family:   . Attends Religious Services:   . Active Member of Clubs or Organizations:   . Attends Archivist Meetings:   Marland Kitchen Marital Status:   Intimate Partner Violence:   . Fear of Current or Ex-Partner:   . Emotionally Abused:   Marland Kitchen Physically Abused:   . Sexually Abused:   formerly worked Architect, Museum/gallery conservator, currently does home care, house clearing    No Known Allergies   Outpatient Medications Prior to Visit  Medication Sig Dispense Refill  . albuterol (VENTOLIN HFA) 108 (90 Base) MCG/ACT inhaler Inhale 1-2 puffs into the lungs every 6 (six) hours as needed for wheezing or shortness of breath. 18 Inhaler 1  . ANORO ELLIPTA 62.5-25 MCG/INH AEPB INHALE 1 PUFF BY MOUTH EVERY DAY 60 each 11  . aspirin 81 MG tablet Take 81 mg by mouth daily.    . cholecalciferol (VITAMIN D) 1000 UNITS tablet Take 1,000 Units by mouth daily.      Marland Kitchen dicyclomine (BENTYL) 10 MG capsule TAKE 1 CAPSULE (10 MG TOTAL) BY MOUTH 4 (FOUR) TIMES DAILY - BEFORE MEALS AND AT BEDTIME. 90 capsule 0  . folic acid (FOLVITE) 1 MG tablet Take 1 mg by mouth daily.    Marland Kitchen gabapentin (NEURONTIN) 300 MG capsule Take 1 capsule (300 mg total) by mouth 3 (three) times daily. 2 po qam, 2 po afternoon, 3 po qhs 630 capsule 1  . guaiFENesin-dextromethorphan (ROBITUSSIN DM) 100-10 MG/5ML syrup Take 5 mLs by mouth every 4 (four) hours as needed for  cough. 118 mL 0  . hydroxychloroquine (PLAQUENIL) 200 MG tablet Take 200 mg by mouth 2 (two) times daily.    . methotrexate 2.5 MG tablet Take by mouth once a week.    . MULTIPLE VITAMIN PO Take 1 tablet by mouth daily.      . Omega-3 1000 MG CAPS Take 1,000 mg by mouth daily.    . RESTASIS 0.05 % ophthalmic emulsion INSTILL 1 DROP INTO BOTH EYES TWICE A DAY  6  . triamcinolone cream (KENALOG) 0.1 % APPLY TO AFFECTED AREA TWICE A DAY FOR 2 WEEK AS DIRECTED  0  . vitamin  C (ASCORBIC ACID) 500 MG tablet Take 500 mg by mouth daily.      . vitamin E 1000 UNIT capsule Take 1,000 Units by mouth daily.    Marland Kitchen zolpidem (AMBIEN) 5 MG tablet TAKE 1 TABLET (5 MG TOTAL) BY MOUTH AT BEDTIME AS NEEDED. 30 tablet 2  . glucosamine-chondroitin 500-400 MG tablet Take 2 tablets by mouth 2 (two) times daily.    . pantoprazole (PROTONIX) 40 MG tablet Take 1 tablet (40 mg total) by mouth 2 (two) times daily before a meal. 180 tablet 0   No facility-administered medications prior to visit.        Objective:   Physical Exam Vitals:   07/29/19 0938  BP: 112/64  Pulse: 68  SpO2: 98%  Weight: 117 lb (53.1 kg)  Height: 5' 3.5" (1.613 m)   Gen: Pleasant, well-nourished, in no distress,  normal affect  ENT: No lesions,  mouth clear,  oropharynx clear, no postnasal drip  Neck: No JVD, no Stridor  Lungs: No use of accessory muscles,  clear without rales or rhonchi  Cardiovascular: RRR, heart sounds normal, no murmur or gallops, no peripheral edema  Musculoskeletal: No deformities, no cyanosis or clubbing  Neuro: alert, non focal  Skin: no rash     Assessment & Plan:  COPD (chronic obstructive pulmonary disease) (HCC) Please continue Anoro 1 inhalation once daily. Keep your albuterol available to use 2 puffs up to every 4 hours if needed for shortness of breath, chest tightness, wheezing. Congratulations on decreasing your smoking.  Keep up the good work.  We will help you quit if possible. I believe  you would benefit from getting the COVID-19 vaccine when you are comfortable doing so. Follow with Dr Lamonte Sakai in 6 months or sooner if you have any problems  Methotrexate, long term, current use She has started methotrexate since her last visit for her autoimmune disease.  I will check a chest x-ray today and follow to ensure no interstitial changes.  Baltazar Apo, MD, PhD 07/29/2019, 10:02 AM [facility] [phone] or if no answer [phone]

## 2019-07-29 NOTE — Patient Instructions (Addendum)
Visit Information  Thank you for meeting with me to discuss your medications! I look forward to working with you to achieve your health care goals. Below is a summary of what we talked about during the visit:  Goals Addressed            This Visit's Progress   . Pharmacy Care Plan       CARE PLAN ENTRY  Current Barriers:  . Chronic Disease Management support, education, and care coordination needs related to COPD and IBS, neuropathy  Pharmacist Clinical Goal(s):  Marland Kitchen Over the next 180 days, patient will work with the pharmacist to optimize medication regimen and adherence  Interventions: . Comprehensive medication review performed. . Discussed lifestyle modifications for primary prevention of heart disease o Patient will continue modified keto diet o Patient will gradually increase walking from 1/4 mile up to 1 mile as tolerated . Discussed importance of daily folic acid supplementation with methotrexate  Patient Self Care Activities:  . Self administers medications as prescribed, Calls pharmacy for medication refills, and Calls provider office for new concerns or questions  Initial goal documentation      Ms. Janz was given information about Chronic Care Management services today including:  1. CCM service includes personalized support from designated clinical staff supervised by her physician, including individualized plan of care and coordination with other care providers 2. 24/7 contact phone numbers for assistance for urgent and routine care needs. 3. Standard insurance, coinsurance, copays and deductibles apply for chronic care management only during months in which we provide at least 20 minutes of these services. Most insurances cover these services at 100%, however patients may be responsible for any copay, coinsurance and/or deductible if applicable. This service may help you avoid the need for more expensive face-to-face services. 4. Only one practitioner may furnish and  bill the service in a calendar month. 5. The patient may stop CCM services at any time (effective at the end of the month) by phone call to the office staff.  Patient agreed to services and verbal consent obtained.   The patient verbalized understanding of instructions provided today and declined a print copy of patient instruction materials.  Telephone follow up appointment with pharmacy team member scheduled for: 6 months  Charlene Brooke, PharmD Clinical Pharmacist [facility] [phone]  Diet for Irritable Bowel Syndrome When you have irritable bowel syndrome (IBS), it is very important to eat the foods and follow the eating habits that are best for your condition. IBS may cause various symptoms such as pain in the abdomen, constipation, or diarrhea. Choosing the right foods can help to ease the discomfort from these symptoms. Work with your health care provider and diet and nutrition specialist (dietitian) to find the eating plan that will help to control your symptoms. What are tips for following this plan?      Keep a food diary. This will help you identify foods that cause symptoms. Write down: ? What you eat and when you eat it. ? What symptoms you have. ? When symptoms occur in relation to your meals, such as "pain in abdomen 2 hours after dinner."  Eat your meals slowly and in a relaxed setting.  Aim to eat 5-6 small meals per day. Do not skip meals.  Drink enough fluid to keep your urine pale yellow.  Ask your health care provider if you should take an over-the-counter probiotic to help restore healthy bacteria in your gut (digestive tract). ? Probiotics  are foods that contain good bacteria and yeasts.  Your dietitian may have specific dietary recommendations for you based on your symptoms. He or she may recommend that you: ? Avoid foods that cause symptoms. Talk with your dietitian about other ways to get the same nutrients that are in  those problem foods. ? Avoid foods with gluten. Gluten is a protein that is found in rye, wheat, and barley. ? Eat more foods that contain soluble fiber. Examples of foods with high soluble fiber include oats, seeds, and certain fruits and vegetables. Take a fiber supplement if directed by your dietitian. ? Reduce or avoid certain foods called FODMAPs. These are foods that contain carbohydrates that are hard to digest. Ask your doctor which foods contain these carbohydrates. What foods are not recommended? The following are some foods and drinks that may make your symptoms worse:  Fatty foods, such as french fries.  Foods that contain gluten, such as pasta and cereal.  Dairy products, such as milk, cheese, and ice cream.  Chocolate.  Alcohol.  Products with caffeine, such as coffee.  Carbonated drinks, such as soda.  Foods that are high in FODMAPs. These include certain fruits and vegetables.  Products with sweeteners such as honey, high fructose corn syrup, sorbitol, and mannitol. The items listed above may not be a complete list of foods and beverages you should avoid. Contact a dietitian for more information. What foods are good sources of fiber? Your health care provider or dietitian may recommend that you eat more foods that contain fiber. Fiber can help to reduce constipation and other IBS symptoms. Add foods with fiber to your diet a little at a time so your body can get used to them. Too much fiber at one time might cause gas and swelling of your abdomen. The following are some foods that are good sources of fiber:  Berries, such as raspberries, strawberries, and blueberries.  Tomatoes.  Carrots.  Brown rice.  Oats.  Seeds, such as chia and pumpkin seeds. The items listed above may not be a complete list of recommended sources of fiber. Contact your dietitian for more options. Where to find more information  International Foundation for Functional Gastrointestinal  Disorders: www.iffgd.[employer] of Diabetes and Digestive and Kidney Diseases: [employer] Summary  When you have irritable bowel syndrome (IBS), it is very important to eat the foods and follow the eating habits that are best for your condition.  IBS may cause various symptoms such as pain in the abdomen, constipation, or diarrhea.  Choosing the right foods can help to ease the discomfort that comes from symptoms.  Keep a food diary. This will help you identify foods that cause symptoms.  Your health care provider or diet and nutrition specialist (dietitian) may recommend that you eat more foods that contain fiber. This information is not intended to replace advice given to you by your health care provider. Make sure you discuss any questions you have with your health care provider. Document Revised: 07/29/2018 Document Reviewed: 12/10/2016 Elsevier Patient Education  [place].

## 2019-07-29 NOTE — Chronic Care Management (AMB) (Signed)
Chronic Care Management Pharmacy  Name: Sheila Morales  MRN: [id number] DOB: 1950-05-21   Chief Complaint/ HPI  Sheila Morales,  69 y.o. , female presents for their Initial CCM visit with the clinical pharmacist via telephone due to COVID-19 Pandemic.  PCP : Sheila Koch, MD  Their chronic conditions include: COPD, IBS, neuropathy, BPPV, lupus, insomnia  Neuropathy is biggest issue. Walks 0.25 mile every day.  Keto diet: 134 lbs down to 113. But also worsening BM.   Office Visits: 03/29/19 Sheila Morales OV: chronic cough, smoking more during pandemic, not ready to quit. Rec'd sennakot OTC for constipation.  Consult Visit: 07/29/19 Sheila Morales (pulmonary): pt has decreased smoking. Rec'd COVID vaccine. No changes to inhalers.   02/08/19 Sheila Morales (neurology): neuropathy d/t alcohol and lupus. Symptoms controlled on gabapentin. No changes.  12/01/18 Sheila Morales (GI): Hx IBS-D however alesetron tx switched to constipation. Rec'd bowel purge - Miralax, dulcolax. Then take Benefiber daily. Also rec'd taper off pantoprazole.   Medications: Outpatient Encounter Medications as of 07/29/2019  Medication Sig  . albuterol (VENTOLIN HFA) 108 (90 Base) MCG/ACT inhaler Inhale 1-2 puffs into the lungs every 6 (six) hours as needed for wheezing or shortness of breath.  Jearl Klinefelter ELLIPTA 62.5-25 MCG/INH AEPB INHALE 1 PUFF BY MOUTH EVERY DAY  . aspirin 81 MG tablet Take 81 mg by mouth daily.  . calcium carbonate (OS-CAL - DOSED IN MG OF ELEMENTAL CALCIUM) 1250 (500 Ca) MG tablet Take 1 tablet by mouth.  . cholecalciferol (VITAMIN D) 1000 UNITS tablet Take 1,000 Units by mouth daily.    . folic acid (FOLVITE) 1 MG tablet Take 1 mg by mouth daily.  Marland Kitchen gabapentin (NEURONTIN) 300 MG capsule Take 1 capsule (300 mg total) by mouth 3 (three) times daily. 2 po qam, 2 po afternoon, 3 po qhs  . guaiFENesin-dextromethorphan (ROBITUSSIN DM) 100-10 MG/5ML syrup Take 5 mLs by mouth every 4 (four) hours as needed for  cough.  . hydroxychloroquine (PLAQUENIL) 200 MG tablet Take 200 mg by mouth 2 (two) times daily.  . Magnesium-Potassium 250-100 MG TABS Take by mouth.  . methotrexate 2.5 MG tablet Take 10 mg by mouth once a week. Thursdays  . MULTIPLE VITAMIN PO Take 1 tablet by mouth daily.    . Omega-3 1000 MG CAPS Take 1,000 mg by mouth daily.  . RESTASIS 0.05 % ophthalmic emulsion INSTILL 1 DROP INTO BOTH EYES TWICE A DAY  . saccharomyces boulardii (FLORASTOR) 250 MG capsule Take 250 mg by mouth 2 (two) times daily.  Marland Kitchen triamcinolone cream (KENALOG) 0.1 % APPLY TO AFFECTED AREA TWICE A DAY FOR 2 WEEK AS DIRECTED  . vitamin C (ASCORBIC ACID) 500 MG tablet Take 500 mg by mouth daily.    . vitamin E 1000 UNIT capsule Take 1,000 Units by mouth daily.  Marland Kitchen zolpidem (AMBIEN) 5 MG tablet TAKE 1 TABLET (5 MG TOTAL) BY MOUTH AT BEDTIME AS NEEDED.  Marland Kitchen dicyclomine (BENTYL) 10 MG capsule TAKE 1 CAPSULE (10 MG TOTAL) BY MOUTH 4 (FOUR) TIMES DAILY - BEFORE MEALS AND AT BEDTIME. (Patient not taking: Reported on 07/29/2019)  . [DISCONTINUED] glucosamine-chondroitin 500-400 MG tablet Take 2 tablets by mouth 2 (two) times daily.  . [DISCONTINUED] pantoprazole (PROTONIX) 40 MG tablet Take 1 tablet (40 mg total) by mouth 2 (two) times daily before a meal.   No facility-administered encounter medications on file as of 07/29/2019.     Current Diagnosis/Assessment:  SDOH Interventions     Most  Recent Value  SDOH Interventions  SDOH Interventions for the Following Domains  Physical Activity  Physical Activity Interventions  Other (Comments) [patient walking 1/4 mile daily]      Goals Addressed            This Visit's Progress   . Pharmacy Care Plan       CARE PLAN ENTRY  Current Barriers:  . Chronic Disease Management support, education, and care coordination needs related to COPD and IBS, neuropathy  Pharmacist Clinical Goal(s):  Marland Kitchen Over the next 180 days, patient will work with the pharmacist to optimize medication  regimen and adherence  Interventions: . Comprehensive medication review performed. . Discussed lifestyle modifications for primary prevention of heart disease o Patient will continue modified keto diet o Patient will gradually increase walking from 1/4 mile up to 1 mile as tolerated . Discussed importance of daily folic acid supplementation with methotrexate  Patient Self Care Activities:  . Self administers medications as prescribed, Calls pharmacy for medication refills, and Calls provider office for new concerns or questions  Initial goal documentation        COPD / Tobacco   Last spirometry score: FEV1/FVC 0.59, FEV1 57% predicted  Gold Grade: Gold 2 (FEV1 50-79%) Current COPD Classification:  A (low sx, <2 exacerbations/yr)  Tobacco Status:  Social History   Tobacco Use  Smoking Status Current Every Day Smoker  . Packs/day: 0.25  . Years: 30.00  . Pack years: 7.50  . Types: Cigarettes  Smokeless Tobacco Never Used  Tobacco Comment   Declined info    Patient has failed these meds in past: nicotine patch (didn't help much), nicotine gum ("tore up my throat"), Chantix (upset stomach)  Patient is currently controlled on the following medications: Anoro 1 puff daily, albuterol HFA prn  Using maintenance inhaler regularly? Yes Frequency of rescue inhaler use:  multiple times per day -cleans houses, uses albuterol in AM and PM   We discussed:  proper inhaler technique,   Smoking cessation - down to 9-10 cigarettes per day, not yet ready to quit but "getting there". Discussed benefits of various medications and NRT for assisting with quitting. Pt has tried patches, gum and Chantix before without success. She will let me or Sheila Morales know when she is ready to quit.  Plan  Continue current medications    Lipids   Lipid Panel     Component Value Date/Time   CHOL 148 03/29/2019 0909   TRIG 66.0 03/29/2019 0909   HDL 63.60 03/29/2019 0909   CHOLHDL 2 03/29/2019  0909   VLDL 13.2 03/29/2019 0909   LDLCALC 71 03/29/2019 0909     The 10-year ASCVD risk score (Goff DC Jr., et al., 2013) is: 9.6%   Values used to calculate the score:     Age: 58 years     Sex: Female     Is Non-Hispanic African American: No     Diabetic: No     Tobacco smoker: Yes     Systolic Blood Pressure: [id number] mmHg     Is BP treated: No     HDL Cholesterol: 63.6 mg/dL     Total Cholesterol: 148 mg/dL   Patient has failed these meds in past: n/a Patient is currently controlled on the following medications: Fish Oil OTC, aspirin 81 mg daily  We discussed:  diet and exercise extensively; pt is currently on a modified keto diet, has lost ~20 lbs. She walks on Treadmill 1/4 mile every day. Legs hurt  worse if she doesn't walk. Discussed cholesterol goals, role of cholesterol in ASCVD.  Plan  Continue control with diet and exercise    Lupus   Patient has failed these meds in past: n/a Patient is currently controlled on the following medications: methotrexate 10 mg (2.5 mg x 4) once a week, folic acid 1 mg daily, hydroxychloroquine 200 mg BID  We discussed:  Since starting MTX she has not had a lupus outbreak. Endorses compliance with daily folic acid and weekly MTX.  Plan  Continue current medications   Neuropathy   Patient has failed these meds in past: n/a Patient is currently controlled on the following medications: gabapentin 300 mg - 2 AM, 2 PM, 3 HS  We discussed:  Overall controlling neuropathy well, still tingling feet/ankles. If it gets bad she takes 2 Aleve.    Plan  Continue current medications   Insomnia   Patient has failed these meds in past: n/a Patient is currently controlled on the following medications: zolpidem 5 mg HS prn  We discussed:  Pt gets about 5 hrs of sleep a night, better than she used to get. She denies morning grogginess/drowsiness.  Plan  Continue current medications   IBS   Patient has failed these meds in past:  dicyclomine Patient is currently controlled on the following medications: Florastor,  Benefiber prn  We discussed:  Pt reports some days she has 4-5 BM per day, others she is constipated and Benefiber helps with this. She is no longer taking dicylomine, she cannot remember if it helped her in the past. She is willing to try dicyclomine again, will request refill from PCP.  Plan  Continue current medications  Request refill for dicyclomine   Health Maintenance   Patient is currently controlled on the following medications: Vitamin D 1000 IU, multivitamin, Vitamin C 500 mg, Vitamin E 1000 IU, triamcinolone 0.1% cream, Restasis OU BID, Robitussin DM prn  We discussed:  Patient is satisfied with current OTC/PRN regimen and denies issues  Plan  Continue current medications    Medication Management   Pt uses [facility] for all medications Uses pill box - weekly Pt endorses 100% compliance  We discussed: benefits of medication synchronization, packaging and delivery with [facility]. Pt wishes to discuss with her husband and will let me know if she wants to switch pharmacies.  Plan  Continue current medication management strategy     Follow up: 6 month phone visit  Charlene Brooke, PharmD Clinical Pharmacist [facility] at [facility] [phone]

## 2019-07-29 NOTE — Patient Instructions (Signed)
Please continue Anoro 1 inhalation once daily. Keep your albuterol available to use 2 puffs up to every 4 hours if needed for shortness of breath, chest tightness, wheezing. Congratulations on decreasing your smoking.  Keep up the good work.  We will help you quit if possible. Chest x-ray today I believe you would benefit from getting the COVID-19 vaccine when you are comfortable doing so. Follow with Dr Lamonte Sakai in 6 months or sooner if you have any problems

## 2019-07-30 NOTE — Progress Notes (Signed)
Patient called me back, she would [facility] to [facility].  Verbal consent obtained for [facility] enhanced pharmacy services (medication synchronization, adherence packaging, delivery coordination). A medication sync plan was created to allow patient to get all medications delivered once every 30 to 90 days per patient preference. Patient understands they have freedom to choose pharmacy and clinical pharmacist will coordinate care between all prescribers and [facility].   Plan:  Utilize [facility] for medication synchronization, packaging and delivery   Charlene Brooke, PharmD 07/30/19 4:59 PM

## 2019-08-03 ENCOUNTER — Telehealth: Payer: Self-pay | Admitting: Emergency Medicine

## 2019-08-03 DIAGNOSIS — J209 Acute bronchitis, unspecified: Secondary | ICD-10-CM

## 2019-08-03 MED ORDER — ALBUTEROL SULFATE HFA 108 (90 BASE) MCG/ACT IN AERS: 1.0000 | INHALATION_SPRAY | Freq: Four times a day (QID) | RESPIRATORY_TRACT | 2 refills | Status: DC | PRN

## 2019-08-03 MED ORDER — ANORO ELLIPTA 62.5-25 MCG/INH IN AEPB: INHALATION_SPRAY | RESPIRATORY_TRACT | 5 refills | Status: DC

## 2019-08-03 NOTE — Telephone Encounter (Signed)
I sent Rx's for Pro AIr and Anoro to pharmacy. Nothing further is needed.    Patient Instructions by Collene Gobble, MD at 07/29/2019 9:30 AM Author: Collene Gobble, MD Author Type: Physician Filed: 07/29/2019 10:01 AM  Note Status: Signed Cosign: Cosign Not Required Encounter Date: 07/29/2019  Editor: Collene Gobble, MD (Physician)    Please continue Anoro 1 inhalation once daily. Keep your albuterol available to use 2 puffs up to every 4 hours if needed for shortness of breath, chest tightness, wheezing. Congratulations on decreasing your smoking.  Keep up the good work.  We will help you quit if possible. Chest x-ray today I believe you would benefit from getting the COVID-19 vaccine when you are comfortable doing so. Follow with Dr Lamonte Sakai in 6 months or sooner if you have any problems

## 2019-08-05 ENCOUNTER — Other Ambulatory Visit: Payer: Self-pay | Admitting: Internal Medicine

## 2019-08-05 MED ORDER — ZOLPIDEM TARTRATE 5 MG PO TABS: 5.0000 mg | ORAL_TABLET | Freq: Every evening | ORAL | 2 refills | Status: DC | PRN

## 2019-08-16 ENCOUNTER — Telehealth: Payer: Self-pay

## 2019-08-16 ENCOUNTER — Telehealth: Payer: Self-pay | Admitting: Neurology

## 2019-08-16 DIAGNOSIS — Z961 Presence of intraocular lens: Secondary | ICD-10-CM | POA: Diagnosis not present

## 2019-08-16 DIAGNOSIS — H43813 Vitreous degeneration, bilateral: Secondary | ICD-10-CM | POA: Diagnosis not present

## 2019-08-16 DIAGNOSIS — H35013 Changes in retinal vascular appearance, bilateral: Secondary | ICD-10-CM | POA: Diagnosis not present

## 2019-08-16 DIAGNOSIS — Z79899 Other long term (current) drug therapy: Secondary | ICD-10-CM | POA: Diagnosis not present

## 2019-08-16 DIAGNOSIS — H524 Presbyopia: Secondary | ICD-10-CM | POA: Diagnosis not present

## 2019-08-16 NOTE — Telephone Encounter (Signed)
Patient advised to follow up with PCP, new spots appeared

## 2019-08-16 NOTE — Telephone Encounter (Signed)
Patient called with concerns about new brown spots on both of her feet starting at the toes. She wants to know if this is related to her neuropathy?

## 2019-08-16 NOTE — Telephone Encounter (Signed)
New message    The patient is asking for the nurse to call her no details were given.

## 2019-08-16 NOTE — Telephone Encounter (Signed)
While neuropathy can cause a discoloration of the feet/legs, if there are new brown spots, I would follow up with PCP.  Dr. Posey Pronto hasn't seen the patient in person since 2019 and last visit was just a phone visit.

## 2019-08-17 NOTE — Telephone Encounter (Signed)
Spoke with patient, she is having right hip pain.  Patient said that she will call and schedule and appointment if pain worsens

## 2019-08-23 DIAGNOSIS — M531 Cervicobrachial syndrome: Secondary | ICD-10-CM | POA: Diagnosis not present

## 2019-08-23 DIAGNOSIS — M9902 Segmental and somatic dysfunction of thoracic region: Secondary | ICD-10-CM | POA: Diagnosis not present

## 2019-08-23 DIAGNOSIS — M9901 Segmental and somatic dysfunction of cervical region: Secondary | ICD-10-CM | POA: Diagnosis not present

## 2019-08-23 DIAGNOSIS — M5414 Radiculopathy, thoracic region: Secondary | ICD-10-CM | POA: Diagnosis not present

## 2019-08-23 DIAGNOSIS — M9903 Segmental and somatic dysfunction of lumbar region: Secondary | ICD-10-CM | POA: Diagnosis not present

## 2019-08-23 DIAGNOSIS — M5417 Radiculopathy, lumbosacral region: Secondary | ICD-10-CM | POA: Diagnosis not present

## 2019-08-25 DIAGNOSIS — M531 Cervicobrachial syndrome: Secondary | ICD-10-CM | POA: Diagnosis not present

## 2019-08-25 DIAGNOSIS — M9903 Segmental and somatic dysfunction of lumbar region: Secondary | ICD-10-CM | POA: Diagnosis not present

## 2019-08-25 DIAGNOSIS — M5417 Radiculopathy, lumbosacral region: Secondary | ICD-10-CM | POA: Diagnosis not present

## 2019-08-25 DIAGNOSIS — M9902 Segmental and somatic dysfunction of thoracic region: Secondary | ICD-10-CM | POA: Diagnosis not present

## 2019-08-25 DIAGNOSIS — M9901 Segmental and somatic dysfunction of cervical region: Secondary | ICD-10-CM | POA: Diagnosis not present

## 2019-08-25 DIAGNOSIS — M5414 Radiculopathy, thoracic region: Secondary | ICD-10-CM | POA: Diagnosis not present

## 2019-08-30 DIAGNOSIS — M531 Cervicobrachial syndrome: Secondary | ICD-10-CM | POA: Diagnosis not present

## 2019-08-30 DIAGNOSIS — M9903 Segmental and somatic dysfunction of lumbar region: Secondary | ICD-10-CM | POA: Diagnosis not present

## 2019-08-30 DIAGNOSIS — M9901 Segmental and somatic dysfunction of cervical region: Secondary | ICD-10-CM | POA: Diagnosis not present

## 2019-08-30 DIAGNOSIS — M9902 Segmental and somatic dysfunction of thoracic region: Secondary | ICD-10-CM | POA: Diagnosis not present

## 2019-08-30 DIAGNOSIS — M5414 Radiculopathy, thoracic region: Secondary | ICD-10-CM | POA: Diagnosis not present

## 2019-08-30 DIAGNOSIS — M5417 Radiculopathy, lumbosacral region: Secondary | ICD-10-CM | POA: Diagnosis not present

## 2019-09-06 DIAGNOSIS — M9902 Segmental and somatic dysfunction of thoracic region: Secondary | ICD-10-CM | POA: Diagnosis not present

## 2019-09-06 DIAGNOSIS — M9903 Segmental and somatic dysfunction of lumbar region: Secondary | ICD-10-CM | POA: Diagnosis not present

## 2019-09-06 DIAGNOSIS — M5417 Radiculopathy, lumbosacral region: Secondary | ICD-10-CM | POA: Diagnosis not present

## 2019-09-06 DIAGNOSIS — M9901 Segmental and somatic dysfunction of cervical region: Secondary | ICD-10-CM | POA: Diagnosis not present

## 2019-09-06 DIAGNOSIS — M531 Cervicobrachial syndrome: Secondary | ICD-10-CM | POA: Diagnosis not present

## 2019-09-06 DIAGNOSIS — M5414 Radiculopathy, thoracic region: Secondary | ICD-10-CM | POA: Diagnosis not present

## 2019-09-08 ENCOUNTER — Ambulatory Visit: Payer: Self-pay | Admitting: Pharmacist

## 2019-09-08 DIAGNOSIS — G609 Hereditary and idiopathic neuropathy, unspecified: Secondary | ICD-10-CM

## 2019-09-08 DIAGNOSIS — J449 Chronic obstructive pulmonary disease, unspecified: Secondary | ICD-10-CM

## 2019-09-13 DIAGNOSIS — L932 Other local lupus erythematosus: Secondary | ICD-10-CM | POA: Diagnosis not present

## 2019-09-13 DIAGNOSIS — M9902 Segmental and somatic dysfunction of thoracic region: Secondary | ICD-10-CM | POA: Diagnosis not present

## 2019-09-13 DIAGNOSIS — M5417 Radiculopathy, lumbosacral region: Secondary | ICD-10-CM | POA: Diagnosis not present

## 2019-09-13 DIAGNOSIS — M9903 Segmental and somatic dysfunction of lumbar region: Secondary | ICD-10-CM | POA: Diagnosis not present

## 2019-09-13 DIAGNOSIS — Z79899 Other long term (current) drug therapy: Secondary | ICD-10-CM | POA: Diagnosis not present

## 2019-09-13 DIAGNOSIS — Z6821 Body mass index (BMI) 21.0-21.9, adult: Secondary | ICD-10-CM | POA: Diagnosis not present

## 2019-09-13 DIAGNOSIS — M9901 Segmental and somatic dysfunction of cervical region: Secondary | ICD-10-CM | POA: Diagnosis not present

## 2019-09-13 DIAGNOSIS — I7300 Raynaud's syndrome without gangrene: Secondary | ICD-10-CM | POA: Diagnosis not present

## 2019-09-13 DIAGNOSIS — M531 Cervicobrachial syndrome: Secondary | ICD-10-CM | POA: Diagnosis not present

## 2019-09-13 DIAGNOSIS — M5414 Radiculopathy, thoracic region: Secondary | ICD-10-CM | POA: Diagnosis not present

## 2019-09-13 DIAGNOSIS — M328 Other forms of systemic lupus erythematosus: Secondary | ICD-10-CM | POA: Diagnosis not present

## 2019-09-13 DIAGNOSIS — M2550 Pain in unspecified joint: Secondary | ICD-10-CM | POA: Diagnosis not present

## 2019-09-17 NOTE — Chronic Care Management (AMB) (Signed)
  Chronic Care Management   Outreach Note  09/17/2019 Name: Sheila Morales MRN: [id number] DOB: [birth year]  Referred by: Hoyt Koch, MD Reason for referral : Chronic Care Management and Medication Management   Reviewed chart for medication changes ahead of medication coordination call. . No Office visits, Consults, or Hospital visits since last care coordination call/Pharmacist visit.  . No medication changes indicated   BP Readings from Last 3 Encounters:  07/29/19 112/64  03/29/19 110/70  12/01/18 (!) 142/82    Lab Results  Component Value Date   HGBA1C 5.7 02/24/2017     Medication management via [facility]: Patient obtains medications through Vials  90 Days   Patient is due for next adherence delivery on: 10/04/2019. Called patient and reviewed medications and coordinated delivery.  This delivery to include: Folic Acid 1mg  daily  Anoro Ellipta 62.5 mcg-59mcg one puff daily  Albuteerol Aer Hfa 1-2 puffs every 6 hours  Gabapentin 300mg  two capsule morning, two capsules at bedtime    Patient needs refills for gabapentin - last prescribed 05/11/19 for 6 month supply. Will request from PCP.   Confirmed delivery date of 10/04/2019, advised patient that pharmacy will contact them the morning of delivery.   Charlene Brooke, PharmD

## 2019-09-27 ENCOUNTER — Encounter: Payer: Self-pay | Admitting: Internal Medicine

## 2019-09-27 ENCOUNTER — Other Ambulatory Visit: Payer: Self-pay

## 2019-09-27 ENCOUNTER — Ambulatory Visit (INDEPENDENT_AMBULATORY_CARE_PROVIDER_SITE_OTHER): Payer: PPO | Admitting: Internal Medicine

## 2019-09-27 VITALS — BP 130/84 | HR 59 | Temp 98.2°F | Ht 63.5 in | Wt 119.0 lb

## 2019-09-27 DIAGNOSIS — M9902 Segmental and somatic dysfunction of thoracic region: Secondary | ICD-10-CM | POA: Diagnosis not present

## 2019-09-27 DIAGNOSIS — J410 Simple chronic bronchitis: Secondary | ICD-10-CM | POA: Diagnosis not present

## 2019-09-27 DIAGNOSIS — K581 Irritable bowel syndrome with constipation: Secondary | ICD-10-CM

## 2019-09-27 DIAGNOSIS — M5417 Radiculopathy, lumbosacral region: Secondary | ICD-10-CM | POA: Diagnosis not present

## 2019-09-27 DIAGNOSIS — M9903 Segmental and somatic dysfunction of lumbar region: Secondary | ICD-10-CM | POA: Diagnosis not present

## 2019-09-27 DIAGNOSIS — G609 Hereditary and idiopathic neuropathy, unspecified: Secondary | ICD-10-CM | POA: Diagnosis not present

## 2019-09-27 DIAGNOSIS — J449 Chronic obstructive pulmonary disease, unspecified: Secondary | ICD-10-CM

## 2019-09-27 DIAGNOSIS — M5414 Radiculopathy, thoracic region: Secondary | ICD-10-CM | POA: Diagnosis not present

## 2019-09-27 DIAGNOSIS — M9901 Segmental and somatic dysfunction of cervical region: Secondary | ICD-10-CM | POA: Diagnosis not present

## 2019-09-27 DIAGNOSIS — M531 Cervicobrachial syndrome: Secondary | ICD-10-CM | POA: Diagnosis not present

## 2019-09-27 MED ORDER — GABAPENTIN 300 MG PO CAPS: ORAL_CAPSULE | ORAL | 1 refills | Status: DC

## 2019-09-27 MED ORDER — DICYCLOMINE HCL 10 MG PO CAPS: 10.0000 mg | ORAL_CAPSULE | Freq: Three times a day (TID) | ORAL | 0 refills | Status: DC

## 2019-09-27 NOTE — Assessment & Plan Note (Signed)
Stable, with mild progression due to increase in smoking. Counseled to quit smoking as this will worsen her lung disease.

## 2019-09-27 NOTE — Progress Notes (Signed)
   Subjective:   Patient ID: Sheila Morales, female    DOB: May 09, 1950, 69 y.o.   MRN: [id number]  HPI The patient is a 69 YO female coming in for follow up ibs (having some constipation mixed with diarrhea, tried metamucil in coffee in the morning and this caused bloating and pain and did not help with BM, denies blood in stool, colonooscopy up to date with last 2016) and neuropathy (using gabapentin 600 mg q morning, q afternoon, and 900 mg q evening, still getting some symptoms with wearing shoes, gets sensitivity to this which is helped by removing shoes, denies swelling, feels that there is some color change, needs refill on gabapentin today) and COPD (SOB is some worse recently, she is smoking more recently, denies change in sputum or cough, does have chronic cough, SOB worse with incline).   Review of Systems  Constitutional: Negative.   HENT: Negative.   Eyes: Negative.   Respiratory: Positive for shortness of breath. Negative for cough and chest tightness.   Cardiovascular: Negative for chest pain, palpitations and leg swelling.  Gastrointestinal: Positive for abdominal pain, constipation and diarrhea. Negative for abdominal distention, nausea and vomiting.  Musculoskeletal: Negative.   Skin: Negative.   Neurological: Negative.   Psychiatric/Behavioral: Negative.     Objective:  Physical Exam Constitutional:      Appearance: She is well-developed.  HENT:     Head: Normocephalic and atraumatic.  Cardiovascular:     Rate and Rhythm: Normal rate and regular rhythm.  Pulmonary:     Effort: Pulmonary effort is normal. No respiratory distress.     Breath sounds: Rhonchi present. No wheezing or rales.     Comments: Stable lung exam Abdominal:     General: Bowel sounds are normal. There is no distension.     Palpations: Abdomen is soft.     Tenderness: There is no abdominal tenderness. There is no rebound.  Musculoskeletal:     Cervical back: Normal range of motion.  Skin:  General: Skin is warm and dry.  Neurological:     Mental Status: She is alert and oriented to person, place, and time.     Coordination: Coordination normal.     Vitals:   09/27/19 0753  BP: 130/84  Pulse: (!) 59  Temp: 98.2 F (36.8 C)  TempSrc: Oral  SpO2: 98%  Weight: 119 lb (54 kg)  Height: 5' 3.5" (1.613 m)    This visit occurred during the SARS-CoV-2 public health emergency.  Safety protocols were in place, including screening questions prior to the visit, additional usage of staff PPE, and extensive cleaning of exam room while observing appropriate contact time as indicated for disinfecting solutions.   Assessment & Plan:

## 2019-09-27 NOTE — Assessment & Plan Note (Signed)
Refill gabapentin which is mostly effective. She is having some symptoms with sensitivity to wearing shoes lately. Advised to quit smoking as this negatively impacts on blood flow and can impact her symptoms as well.

## 2019-09-27 NOTE — Patient Instructions (Signed)
I would recommend to try claritin to help get rid of the sinus drainage.

## 2019-09-27 NOTE — Assessment & Plan Note (Signed)
Refill dicyclomine to try for her current symptoms and let us know if not effective.

## 2019-09-27 NOTE — Assessment & Plan Note (Signed)
Advised to quit smoking and she feels unable to at this time. She is smoking more and complicated by COPD and SOB with exertion.

## 2019-10-04 DIAGNOSIS — M9902 Segmental and somatic dysfunction of thoracic region: Secondary | ICD-10-CM | POA: Diagnosis not present

## 2019-10-04 DIAGNOSIS — M531 Cervicobrachial syndrome: Secondary | ICD-10-CM | POA: Diagnosis not present

## 2019-10-04 DIAGNOSIS — M9903 Segmental and somatic dysfunction of lumbar region: Secondary | ICD-10-CM | POA: Diagnosis not present

## 2019-10-04 DIAGNOSIS — M9901 Segmental and somatic dysfunction of cervical region: Secondary | ICD-10-CM | POA: Diagnosis not present

## 2019-10-04 DIAGNOSIS — M5417 Radiculopathy, lumbosacral region: Secondary | ICD-10-CM | POA: Diagnosis not present

## 2019-10-04 DIAGNOSIS — M5414 Radiculopathy, thoracic region: Secondary | ICD-10-CM | POA: Diagnosis not present

## 2019-10-05 ENCOUNTER — Telehealth: Payer: Self-pay | Admitting: Internal Medicine

## 2019-10-05 ENCOUNTER — Ambulatory Visit (INDEPENDENT_AMBULATORY_CARE_PROVIDER_SITE_OTHER): Payer: PPO | Admitting: Internal Medicine

## 2019-10-05 ENCOUNTER — Other Ambulatory Visit: Payer: Self-pay

## 2019-10-05 ENCOUNTER — Encounter: Payer: Self-pay | Admitting: Internal Medicine

## 2019-10-05 DIAGNOSIS — L729 Follicular cyst of the skin and subcutaneous tissue, unspecified: Secondary | ICD-10-CM | POA: Diagnosis not present

## 2019-10-05 MED ORDER — TRIAMCINOLONE ACETONIDE 0.1 % EX CREA
1.0000 | TOPICAL_CREAM | Freq: Two times a day (BID) | CUTANEOUS | 0 refills | Status: DC
Start: 2019-10-05 — End: 2020-09-07

## 2019-10-05 MED ORDER — SULFAMETHOXAZOLE-TRIMETHOPRIM 800-160 MG PO TABS
1.0000 | ORAL_TABLET | Freq: Two times a day (BID) | ORAL | 0 refills | Status: DC
Start: 2019-10-05 — End: 2019-11-05

## 2019-10-05 NOTE — Telephone Encounter (Signed)
   Patient calling to report painful "knot" near rectum. Patient states the area is painful during sexual intercourse. Seeking advice before scheduling an appointment

## 2019-10-05 NOTE — Telephone Encounter (Signed)
Would recommend evaluation of area.

## 2019-10-05 NOTE — Assessment & Plan Note (Signed)
Rx bactrim and triamcinolone to use topically. Can do sitz bath as well to help.

## 2019-10-05 NOTE — Progress Notes (Signed)
   Subjective:   Patient ID: Sheila Morales, female    DOB: Mar 30, 1951, 69 y.o.   MRN: [id number]  HPI The patient is a 69 YO female coming in for concerns about swelling on right buttock fold. Has had a small area there for many years. In the last week this has grown and hurting with pressure or intercourse. She denies leakage or drainage on undergarments. Cannot see area to tell if growing or not. Denies fevers or chills.   Review of Systems  Constitutional: Negative.   HENT: Negative.   Eyes: Negative.   Respiratory: Negative for cough, chest tightness and shortness of breath.   Cardiovascular: Negative for chest pain, palpitations and leg swelling.  Gastrointestinal: Negative for abdominal distention, abdominal pain, constipation, diarrhea, nausea and vomiting.  Musculoskeletal: Negative.   Skin: Positive for wound.  Neurological: Negative.   Psychiatric/Behavioral: Negative.     Objective:  Physical Exam Constitutional:      Appearance: She is well-developed.  HENT:     Head: Normocephalic and atraumatic.  Cardiovascular:     Rate and Rhythm: Normal rate and regular rhythm.  Pulmonary:     Effort: Pulmonary effort is normal. No respiratory distress.     Breath sounds: Normal breath sounds. No wheezing or rales.  Abdominal:     General: Bowel sounds are normal. There is no distension.     Palpations: Abdomen is soft.     Tenderness: There is no abdominal tenderness. There is no rebound.  Musculoskeletal:        General: Tenderness present.     Cervical back: Normal range of motion.     Comments: Cyst with some firmness and swelling right buttock close to anus. No drainage expressible  Skin:    General: Skin is warm and dry.  Neurological:     Mental Status: She is alert and oriented to person, place, and time.     Coordination: Coordination normal.     Vitals:   10/05/19 1313  BP: 134/86  Pulse: 67  Temp: 98.3 F (36.8 C)  TempSrc: Oral  SpO2: 96%  Weight: 121  lb (54.9 kg)  Height: 5' 3.5" (1.613 m)    This visit occurred during the SARS-CoV-2 public health emergency.  Safety protocols were in place, including screening questions prior to the visit, additional usage of staff PPE, and extensive cleaning of exam room while observing appropriate contact time as indicated for disinfecting solutions.   Assessment & Plan:

## 2019-10-05 NOTE — Telephone Encounter (Signed)
Appt has been set for today at 1:20pm

## 2019-10-05 NOTE — Patient Instructions (Signed)
We have sent in the bactrim to take 1 pill twice a day for 5 days.

## 2019-10-25 ENCOUNTER — Other Ambulatory Visit: Payer: Self-pay | Admitting: Internal Medicine

## 2019-10-26 NOTE — Telephone Encounter (Signed)
10/05/2019 Zolpidem Tartrate 5 Mg Tablet 30#  Last ov 10/05/19 Next ov 01/25/20

## 2019-11-01 DIAGNOSIS — M5414 Radiculopathy, thoracic region: Secondary | ICD-10-CM | POA: Diagnosis not present

## 2019-11-01 DIAGNOSIS — M531 Cervicobrachial syndrome: Secondary | ICD-10-CM | POA: Diagnosis not present

## 2019-11-01 DIAGNOSIS — M9902 Segmental and somatic dysfunction of thoracic region: Secondary | ICD-10-CM | POA: Diagnosis not present

## 2019-11-01 DIAGNOSIS — M9901 Segmental and somatic dysfunction of cervical region: Secondary | ICD-10-CM | POA: Diagnosis not present

## 2019-11-01 DIAGNOSIS — M9903 Segmental and somatic dysfunction of lumbar region: Secondary | ICD-10-CM | POA: Diagnosis not present

## 2019-11-01 DIAGNOSIS — M5417 Radiculopathy, lumbosacral region: Secondary | ICD-10-CM | POA: Diagnosis not present

## 2019-11-02 ENCOUNTER — Telehealth: Payer: Self-pay | Admitting: Emergency Medicine

## 2019-11-02 NOTE — Telephone Encounter (Addendum)
Spoke to pt, who is requesting an alternative to help her quit smoking.  Pt stated that she has tried and failed nicotine patches and gum.    Dr. Lamonte Sakai, please advise. Thanks

## 2019-11-03 MED ORDER — BUPROPION HCL ER (SR) 150 MG PO TB12
ORAL_TABLET | ORAL | 2 refills | Status: DC
Start: 2019-11-03 — End: 2020-04-24

## 2019-11-03 NOTE — Telephone Encounter (Signed)
Spoke with pt, aware of recs.  Pt states that she has tried Chantix in the past and was unsuccessful, so she would like to try Wellbutrin.  rx sent to preferred pharmacy.  Nothing further needed at this time- will close encounter.    Routing to RB just as FYI.

## 2019-11-03 NOTE — Telephone Encounter (Signed)
We could consider trying either Chantix or Wellbutrin if she is willing to do so.  She would need to know that Chantix does have important side effects that can include depression, even suicidal ideation.  If she would like to start 1 of these medications then we can do so.  If she would prefer to talk to either me or APP in person to discuss we can arrange for that also.

## 2019-11-03 NOTE — Telephone Encounter (Signed)
Thanks

## 2019-11-05 ENCOUNTER — Other Ambulatory Visit: Payer: Self-pay

## 2019-11-05 ENCOUNTER — Ambulatory Visit (INDEPENDENT_AMBULATORY_CARE_PROVIDER_SITE_OTHER): Payer: PPO | Admitting: Internal Medicine

## 2019-11-05 ENCOUNTER — Encounter: Payer: Self-pay | Admitting: Internal Medicine

## 2019-11-05 VITALS — BP 130/74 | HR 68 | Temp 98.3°F | Ht 63.5 in | Wt 121.0 lb

## 2019-11-05 DIAGNOSIS — R21 Rash and other nonspecific skin eruption: Secondary | ICD-10-CM | POA: Diagnosis not present

## 2019-11-05 MED ORDER — METHYLPREDNISOLONE ACETATE 40 MG/ML IJ SUSP
40.0000 mg | Freq: Once | INTRAMUSCULAR | Status: AC
  Administered 2019-11-05: 40 mg via INTRAMUSCULAR

## 2019-11-05 MED ORDER — HYDROXYZINE HCL 10 MG PO TABS: 10.0000 mg | ORAL_TABLET | Freq: Three times a day (TID) | ORAL | 0 refills | Status: DC | PRN

## 2019-11-05 NOTE — Assessment & Plan Note (Signed)
Rx hydroxyzine and depo-medrol 40 mg IM given at visit. Has triamcinolone ointment at home to use if needed.

## 2019-11-05 NOTE — Progress Notes (Signed)
   Subjective:   Patient ID: Sheila Morales, female    DOB: [birth year], 69 y.o.   MRN: [id number]  HPI The patient is a 69 YO female coming in for rash. Started yesterday and having itching as well. Has been taking wellbutrin since last night but that was after rash started. She did have massage on Tuesday but denies any new products. No recent outdoor exposure. No other medication changes. No new soaps, lotions, foods. Denies fevers or chills. Denies others in the household have similar symptoms. Rash started on left arm and now on right arm and legs and back. Has tried nothing. Overall worsening.   Review of Systems  Constitutional: Negative.   HENT: Negative.   Eyes: Negative.   Respiratory: Negative for cough, chest tightness and shortness of breath.   Cardiovascular: Negative for chest pain, palpitations and leg swelling.  Gastrointestinal: Negative for abdominal distention, abdominal pain, constipation, diarrhea, nausea and vomiting.  Musculoskeletal: Negative.   Skin: Positive for rash.       itching  Neurological: Negative.   Psychiatric/Behavioral: Negative.     Objective:  Physical Exam Constitutional:      Appearance: She is well-developed.  HENT:     Head: Normocephalic and atraumatic.  Cardiovascular:     Rate and Rhythm: Normal rate and regular rhythm.  Pulmonary:     Effort: Pulmonary effort is normal. No respiratory distress.     Breath sounds: Normal breath sounds. No wheezing or rales.  Abdominal:     General: Bowel sounds are normal. There is no distension.     Palpations: Abdomen is soft.     Tenderness: There is no abdominal tenderness. There is no rebound.  Musculoskeletal:     Cervical back: Normal range of motion.  Skin:    General: Skin is warm and dry.     Findings: Rash present.     Comments: Rash on left arm, right arm, back, legs  Neurological:     Mental Status: She is alert and oriented to person, place, and time.     Coordination: Coordination  normal.     Vitals:   11/05/19 1123  BP: 130/74  Pulse: 68  Temp: 98.3 F (36.8 C)  TempSrc: Oral  SpO2: 98%  Weight: 121 lb (54.9 kg)  Height: 5' 3.5" (1.613 m)    This visit occurred during the SARS-CoV-2 public health emergency.  Safety protocols were in place, including screening questions prior to the visit, additional usage of staff PPE, and extensive cleaning of exam room while observing appropriate contact time as indicated for disinfecting solutions.   Assessment & Plan:  Depo-medrol 40 mg IM given at visit

## 2019-11-05 NOTE — Patient Instructions (Addendum)
We have sent in hydroxyzine for the itching that you can use up to 3 times a day and have given you the shot of the steroids today.

## 2019-11-07 ENCOUNTER — Other Ambulatory Visit: Payer: Self-pay

## 2019-11-07 ENCOUNTER — Ambulatory Visit
Admission: EM | Admit: 2019-11-07 | Discharge: 2019-11-07 | Disposition: A | Discharge status: Home / Self Care | Payer: PPO | Attending: Emergency Medicine | Admitting: Emergency Medicine

## 2019-11-07 ENCOUNTER — Encounter: Payer: Self-pay | Admitting: Emergency Medicine

## 2019-11-07 DIAGNOSIS — L282 Other prurigo: Secondary | ICD-10-CM

## 2019-11-07 MED ORDER — PREDNISONE 10 MG (21) PO TBPK
ORAL_TABLET | Freq: Every day | ORAL | 0 refills | Status: DC
Start: 2019-11-07 — End: 2019-12-30

## 2019-11-07 MED ORDER — TRIAMCINOLONE ACETONIDE 0.5 % EX OINT
1.0000 | TOPICAL_OINTMENT | Freq: Two times a day (BID) | CUTANEOUS | 0 refills | Status: DC
Start: 2019-11-07 — End: 2020-09-07

## 2019-11-07 NOTE — ED Provider Notes (Signed)
[facility]    CSN: [id number] Arrival date & time: 11/07/19  1308      History   Chief Complaint Chief Complaint  Patient presents with  . Rash    HPI Sheila Morales is a 69 y.o. female with history of SLE, RLS, IBS, COPD presenting for persistent pruritic rash.  Per chart review, patient seen 7/16 by her primary care provider for this: Given Solu-Medrol injection, hydroxyzine and triamcinolone.  Tolerated this well.  Since then, has not impacted hydroxyzine, triamcinolone.  Has triamcinolone at home, though has not used it.  Feels steroid shot helped temporarily, though still having pruritus.  No cough, difficulty breathing, chest pain, fever, arthralgias, myalgias, change in urinary or bowel habit.  No recent change in diet, medications.  Past Medical History:  Diagnosis Date  . Acute respiratory failure with hypoxia ([place]) 11/05/2017  . Arthritis   . Colitis, ischemic ([place])   . External hemorrhoids   . H/O: rheumatic fever   . IBS (irritable bowel syndrome)   . Personal history colonic adenoma 03/25/2008   06/2007 right sided adenoma and 2 right hyperplastic polyps    . Restless leg syndrome    questionable  . Systemic lupus erythematosus ([place])   . Tubular adenoma of colon   . 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 Cyst of buttocks 10/05/2019  . Methotrexate, long term, current use 07/29/2019  . BPPV (benign paroxysmal positional vertigo) 12/16/2017  . Insomnia 02/24/2017  . COPD (chronic obstructive pulmonary disease) ([place]) 01/22/2017  . Routine general medical examination at a health care facility 02/19/2016  . Hereditary and idiopathic peripheral neuropathy 02/17/2015  . Smokers' cough ([place]) 12/07/2014  . Systemic lupus erythematosus ([place]) 10/13/2013  . Rash 05/31/2008  . Irritable bowel syndrome 03/25/2008  . RHEUMATIC FEVER, HX OF 01/17/2007    Past Surgical History:  Procedure Laterality  Date  . CHOLECYSTECTOMY    . CHOLECYSTECTOMY, LAPAROSCOPIC    . COLONOSCOPY    . ESOPHAGOGASTRODUODENOSCOPY    . lesion, vulva excision    . TONSILLECTOMY    . VIDEO BRONCHOSCOPY Bilateral 11/06/2017   Procedure: VIDEO BRONCHOSCOPY WITHOUT FLUORO;  Surgeon: Marshell Garfinkel, MD;  Location: [facility];  Service: Cardiopulmonary;  Laterality: Bilateral;    OB History   No obstetric history on file.      Home Medications    Prior to Admission medications   Medication Sig Start Date End Date Taking? Authorizing Provider  albuterol (VENTOLIN HFA) 108 (90 Base) MCG/ACT inhaler Inhale 1-2 puffs into the lungs every 6 (six) hours as needed for wheezing or shortness of breath. 08/03/19   Collene Gobble, MD  aspirin 81 MG tablet Take 81 mg by mouth daily.    [provider]  buPROPion (WELLBUTRIN SR) 150 MG 12 hr tablet Take 1 tab daily X3 days, then increase to 1 tab twice daily. 11/03/19   Collene Gobble, MD  calcium carbonate (OS-CAL - DOSED IN MG OF ELEMENTAL CALCIUM) 1250 (500 Ca) MG tablet Take 1 tablet by mouth.    [provider]  cholecalciferol (VITAMIN D) 1000 UNITS tablet Take 1,000 Units by mouth daily.      [provider]  dicyclomine (BENTYL) 10 MG capsule Take 1 capsule (10 mg total) by mouth 4 (four) times daily -  before meals and at bedtime. 09/27/19   Hoyt Koch, MD  folic acid Darnelle Catalan)  1 MG tablet Take 1 mg by mouth daily.    [provider]  gabapentin (NEURONTIN) 300 MG capsule 2 po qam, 2 po afternoon, 3 po qhs 09/27/19   Hoyt Koch, MD  hydroxychloroquine (PLAQUENIL) 200 MG tablet Take 200 mg by mouth 2 (two) times daily.    [provider]  hydrOXYzine (ATARAX/VISTARIL) 10 MG tablet Take 1 tablet (10 mg total) by mouth 3 (three) times daily as needed. 11/05/19   Hoyt Koch, MD  Magnesium-Potassium 250-100 MG TABS Take by mouth.    [provider]  methotrexate 2.5 MG tablet Take 10 mg  by mouth once a week. Thursdays    [provider]  MULTIPLE VITAMIN PO Take 1 tablet by mouth daily.      [provider]  Omega-3 1000 MG CAPS Take 1,000 mg by mouth daily.    [provider]  predniSONE (STERAPRED UNI-PAK 21 TAB) 10 MG (21) TBPK tablet Take by mouth daily. Take steroid taper as written 11/07/19   Hall-Potvin, [place], PA-C  RESTASIS 0.05 % ophthalmic emulsion INSTILL 1 DROP INTO BOTH EYES TWICE A DAY 07/30/17   [provider]  saccharomyces boulardii (FLORASTOR) 250 MG capsule Take 250 mg by mouth 2 (two) times daily.    [provider]  triamcinolone cream (KENALOG) 0.1 % Apply 1 application topically 2 (two) times daily. 10/05/19   Hoyt Koch, MD  triamcinolone ointment (KENALOG) 0.5 % Apply 1 application topically 2 (two) times daily. 11/07/19   Hall-Potvin, [place], PA-C  umeclidinium-vilanterol (ANORO ELLIPTA) 62.5-25 MCG/INH AEPB INHALE 1 PUFF BY MOUTH EVERY DAY 08/03/19   Collene Gobble, MD  vitamin C (ASCORBIC ACID) 500 MG tablet Take 500 mg by mouth daily.      [provider]  vitamin E 1000 UNIT capsule Take 1,000 Units by mouth daily.    [provider]  zolpidem (AMBIEN) 5 MG tablet TAKE ONE TABLET BY MOUTH EVERYDAY AT BEDTIME AS NEEDED 10/26/19   Hoyt Koch, MD    Family History Family History  Problem Relation Age of Onset  . Stroke Mother        Deceased, 44s  . Atrial fibrillation Father   . Colon cancer Father   . Other Sister        Pick's disease, deceased  . Healthy Daughter     Social History Social History   Tobacco Use  . Smoking status: Current Every Day Smoker    Packs/day: 0.25    Years: 30.00    Pack years: 7.50    Types: Cigarettes  . Smokeless tobacco: Never Used  . Tobacco comment: Declined info  Vaping Use  . Vaping Use: Never used  Substance Use Topics  . Alcohol use: Yes    Alcohol/week: 5.0 standard drinks    Types: 5 Standard drinks or  equivalent per week    Comment: 2 glasses of wine nightly, 4 beers nightly on weekend  . Drug use: No     Allergies   Patient has no known allergies.   Review of Systems As per HPI   Physical Exam Triage Vital Signs ED Triage Vitals  Enc Vitals Group     BP      Pulse      Resp      Temp      Temp src      SpO2      Weight      Height  Head Circumference      Peak Flow      Pain Score      Pain Loc      Pain Edu?      Excl. in [place]?    No data found.  Updated Vital Signs BP (!) 173/70   Pulse 91   Temp 98.1 F (36.7 C) (Oral)   Resp 18   SpO2 98%   Visual Acuity Right Eye Distance:   Left Eye Distance:   Bilateral Distance:    Right Eye Near:   Left Eye Near:    Bilateral Near:     Physical Exam Constitutional:      General: She is not in acute distress. HENT:     Head: Normocephalic and atraumatic.  Eyes:     General: No scleral icterus.    Pupils: Pupils are equal, round, and reactive to light.  Cardiovascular:     Rate and Rhythm: Normal rate.  Pulmonary:     Effort: Pulmonary effort is normal.  Skin:    Coloration: Skin is not jaundiced or pale.     Comments: Diffuse erythematous rash over arms, legs.  No weeping, tenderness  Neurological:     Mental Status: She is alert and oriented to person, place, and time.      UC Treatments / Results  Labs (all labs ordered are listed, but only abnormal results are displayed) Labs Reviewed - No data to display  EKG   Radiology No results found.  Procedures Procedures (including critical care time)  Medications Ordered in UC Medications - No data to display  Initial Impression / Assessment and Plan / UC Course  I have reviewed the triage vital signs and the nursing notes.  Pertinent labs & imaging results that were available during my care of the patient were reviewed by me and considered in my medical decision making (see chart for details).     Patient afebrile, nontoxic in  office today.  Low concern for secondary cellulitis at this time.  Will prescribe a steroid taper, triamcinolone (patient was unable to pick up medications due to pharmacy not being open: Can pick up from confirm pharmacy today).  She has a dermatologist: We will follow up with them thereafter.  Return precautions discussed, pt verbalized understanding and is agreeable to plan. Final Clinical Impressions(s) / UC Diagnoses   Final diagnoses:  Pruritic rash     Discharge Instructions     Follow up with dermatology (Dr. Amy Martinique)  Keep skin clean - may use gentle soaps without perfumes/dyes. Avoid hot water (showers, baths) as this can further dry out and irritate skin. Pat skin dry as rubbing can irritate and tear skin. Apply triamcinolone 1-2 times daily.     ED Prescriptions    Medication Sig Dispense Auth. Provider   predniSONE (STERAPRED UNI-PAK 21 TAB) 10 MG (21) TBPK tablet Take by mouth daily. Take steroid taper as written 21 tablet Hall-Potvin, [place], PA-C   triamcinolone ointment (KENALOG) 0.5 % Apply 1 application topically 2 (two) times daily. 30 g Hall-Potvin, [place], PA-C     PDMP not reviewed this encounter.   Neldon Mc [place], [place] 11/07/19 [phone]

## 2019-11-07 NOTE — ED Triage Notes (Signed)
Patient presents to [facility] for assessment of rash which started 3-4 days ago.  Was seen by her PCP and given a steroid shot, and was prescribed some medications for itching.  Patient states she has not been able to pick them up because they pharmacy is closed.  Area started to left hand and has since spread everywhere.  Patient states itching is unbearable, denies pain.

## 2019-11-07 NOTE — Discharge Instructions (Addendum)
Follow up with dermatology (Dr. Amy Martinique)  Keep skin clean - may use gentle soaps without perfumes/dyes. Avoid hot water (showers, baths) as this can further dry out and irritate skin. Pat skin dry as rubbing can irritate and tear skin. Apply triamcinolone 1-2 times daily.

## 2019-11-08 DIAGNOSIS — Z79899 Other long term (current) drug therapy: Secondary | ICD-10-CM | POA: Diagnosis not present

## 2019-11-08 DIAGNOSIS — H26491 Other secondary cataract, right eye: Secondary | ICD-10-CM | POA: Diagnosis not present

## 2019-11-08 DIAGNOSIS — H04123 Dry eye syndrome of bilateral lacrimal glands: Secondary | ICD-10-CM | POA: Diagnosis not present

## 2019-11-08 DIAGNOSIS — H26493 Other secondary cataract, bilateral: Secondary | ICD-10-CM | POA: Diagnosis not present

## 2019-11-08 DIAGNOSIS — H35013 Changes in retinal vascular appearance, bilateral: Secondary | ICD-10-CM | POA: Diagnosis not present

## 2019-11-12 ENCOUNTER — Encounter (INDEPENDENT_AMBULATORY_CARE_PROVIDER_SITE_OTHER): Payer: PPO | Admitting: Ophthalmology

## 2019-11-12 ENCOUNTER — Other Ambulatory Visit: Payer: Self-pay

## 2019-11-12 DIAGNOSIS — H26492 Other secondary cataract, left eye: Secondary | ICD-10-CM | POA: Diagnosis not present

## 2019-11-12 DIAGNOSIS — M329 Systemic lupus erythematosus, unspecified: Secondary | ICD-10-CM

## 2019-11-12 DIAGNOSIS — H43813 Vitreous degeneration, bilateral: Secondary | ICD-10-CM | POA: Diagnosis not present

## 2019-11-16 ENCOUNTER — Other Ambulatory Visit: Payer: Self-pay

## 2019-11-16 ENCOUNTER — Encounter (INDEPENDENT_AMBULATORY_CARE_PROVIDER_SITE_OTHER): Payer: PPO | Admitting: Ophthalmology

## 2019-11-16 DIAGNOSIS — L930 Discoid lupus erythematosus: Secondary | ICD-10-CM | POA: Diagnosis not present

## 2019-11-16 DIAGNOSIS — L239 Allergic contact dermatitis, unspecified cause: Secondary | ICD-10-CM | POA: Diagnosis not present

## 2019-11-16 DIAGNOSIS — M329 Systemic lupus erythematosus, unspecified: Secondary | ICD-10-CM | POA: Diagnosis not present

## 2019-11-16 DIAGNOSIS — L821 Other seborrheic keratosis: Secondary | ICD-10-CM | POA: Diagnosis not present

## 2019-11-16 DIAGNOSIS — D2372 Other benign neoplasm of skin of left lower limb, including hip: Secondary | ICD-10-CM | POA: Diagnosis not present

## 2019-11-16 DIAGNOSIS — D225 Melanocytic nevi of trunk: Secondary | ICD-10-CM | POA: Diagnosis not present

## 2019-11-22 ENCOUNTER — Ambulatory Visit (INDEPENDENT_AMBULATORY_CARE_PROVIDER_SITE_OTHER): Payer: PPO | Admitting: Internal Medicine

## 2019-11-22 ENCOUNTER — Encounter: Payer: Self-pay | Admitting: Internal Medicine

## 2019-11-22 ENCOUNTER — Other Ambulatory Visit: Payer: Self-pay

## 2019-11-22 VITALS — BP 116/74 | HR 62 | Temp 98.5°F | Ht 63.5 in | Wt 116.0 lb

## 2019-11-22 DIAGNOSIS — R1032 Left lower quadrant pain: Secondary | ICD-10-CM

## 2019-11-22 MED ORDER — AMOXICILLIN-POT CLAVULANATE 875-125 MG PO TABS
1.0000 | ORAL_TABLET | Freq: Two times a day (BID) | ORAL | 0 refills | Status: DC
Start: 2019-11-22 — End: 2019-12-30

## 2019-11-22 MED ORDER — HYDROCODONE-ACETAMINOPHEN 5-325 MG PO TABS: 1.0000 | ORAL_TABLET | Freq: Four times a day (QID) | ORAL | 0 refills | Status: DC | PRN

## 2019-11-22 NOTE — Patient Instructions (Signed)
We will send in hydrocodone for pain to use if needed.   We have sent in augmentin to take 1 pill twice a day for 1 week.   We are checking labs and a CT scan to look for the cause of the pain.

## 2019-11-22 NOTE — Progress Notes (Signed)
   Subjective:   Patient ID: Sheila Morales, female    DOB: 1951/04/13, 69 y.o.   MRN: [id number]  HPI The patient is a 69 YO female coming in for concerns about abdominal pain, started about 1 week or so ago. Mostly in the LLQ and left side. She has had this intermittently and typically goes away. She is not eating well during this time and drinking some fluids. Denies fevers or chills. Does have chronic constipation but last BM day prior to visit. Denies blood in stool. Some poor appetite and mild nausea. Denies vomiting. Has tried husband's old pain medication and this helped temporarily. She is taking otc acid medicine which does not help and tried bentyl which did not help. Last colonoscopy 2016 normal and prior CT abdomen without contrast 2019 without acute findings. Overall worsening.   Review of Systems  Constitutional: Positive for activity change and appetite change.  HENT: Negative.   Eyes: Negative.   Respiratory: Negative for cough, chest tightness and shortness of breath.   Cardiovascular: Negative for chest pain, palpitations and leg swelling.  Gastrointestinal: Positive for abdominal pain, constipation and nausea. Negative for abdominal distention, anal bleeding, blood in stool, diarrhea, rectal pain and vomiting.  Musculoskeletal: Negative.   Skin: Negative.   Neurological: Negative.   Psychiatric/Behavioral: Negative.     Objective:  Physical Exam Constitutional:      Appearance: She is well-developed.  HENT:     Head: Normocephalic and atraumatic.  Cardiovascular:     Rate and Rhythm: Normal rate and regular rhythm.  Pulmonary:     Effort: Pulmonary effort is normal. No respiratory distress.     Breath sounds: Normal breath sounds. No wheezing or rales.  Abdominal:     General: Bowel sounds are normal. There is no distension.     Palpations: Abdomen is soft.     Tenderness: There is abdominal tenderness. There is no rebound.     Comments: Tenderness LLQ and left  flank. No tenderness right sided.   Musculoskeletal:     Cervical back: Normal range of motion.  Skin:    General: Skin is warm and dry.  Neurological:     Mental Status: She is alert and oriented to person, place, and time.     Coordination: Coordination normal.     Vitals:   11/22/19 1435  BP: 116/74  Pulse: 62  Temp: 98.5 F (36.9 C)  TempSrc: Oral  SpO2: 95%  Weight: 116 lb (52.6 kg)  Height: 5' 3.5" (1.613 m)   This visit occurred during the SARS-CoV-2 public health emergency.  Safety protocols were in place, including screening questions prior to the visit, additional usage of staff PPE, and extensive cleaning of exam room while observing appropriate contact time as indicated for disinfecting solutions.   Assessment & Plan:

## 2019-11-23 ENCOUNTER — Telehealth: Payer: Self-pay | Admitting: Pharmacist

## 2019-11-23 ENCOUNTER — Ambulatory Visit
Admission: RE | Admit: 2019-11-23 | Discharge: 2019-11-23 | Disposition: A | Discharge status: Home / Self Care | Payer: PPO | Source: Ambulatory Visit | Attending: Internal Medicine | Admitting: Internal Medicine

## 2019-11-23 DIAGNOSIS — E278 Other specified disorders of adrenal gland: Secondary | ICD-10-CM | POA: Diagnosis not present

## 2019-11-23 DIAGNOSIS — R1032 Left lower quadrant pain: Secondary | ICD-10-CM

## 2019-11-23 DIAGNOSIS — N3289 Other specified disorders of bladder: Secondary | ICD-10-CM | POA: Diagnosis not present

## 2019-11-23 DIAGNOSIS — K838 Other specified diseases of biliary tract: Secondary | ICD-10-CM | POA: Diagnosis not present

## 2019-11-23 DIAGNOSIS — I700 Atherosclerosis of aorta: Secondary | ICD-10-CM | POA: Diagnosis not present

## 2019-11-23 LAB — LIPASE: Lipase: 26 U/L (ref 7–60)

## 2019-11-23 LAB — COMPREHENSIVE METABOLIC PANEL
AG Ratio: 1.3 (calc) (ref 1.0–2.5)
ALT: 33 U/L — ABNORMAL HIGH (ref 6–29)
AST: 35 U/L (ref 10–35)
Albumin: 4.3 g/dL (ref 3.6–5.1)
Alkaline phosphatase (APISO): 88 U/L (ref 37–153)
BUN: 21 mg/dL (ref 7–25)
CO2: 27 mmol/L (ref 20–32)
Calcium: 9.9 mg/dL (ref 8.6–10.4)
Chloride: 90 mmol/L — ABNORMAL LOW (ref 98–110)
Creat: 0.91 mg/dL (ref 0.60–0.93)
Globulin: 3.2 g/dL (calc) (ref 1.9–3.7)
Glucose, Bld: 104 mg/dL — ABNORMAL HIGH (ref 65–99)
Potassium: 4.6 mmol/L (ref 3.5–5.3)
Sodium: 125 mmol/L — ABNORMAL LOW (ref 135–146)
Total Bilirubin: 0.4 mg/dL (ref 0.2–1.2)
Total Protein: 7.5 g/dL (ref 6.1–8.1)

## 2019-11-23 LAB — CBC
HCT: 42.6 % (ref 35.0–45.0)
Hemoglobin: 14.7 g/dL (ref 11.7–15.5)
MCH: 33.3 pg — ABNORMAL HIGH (ref 27.0–33.0)
MCHC: 34.5 g/dL (ref 32.0–36.0)
MCV: 96.4 fL (ref 80.0–100.0)
MPV: 10.6 fL (ref 7.5–12.5)
Platelets: 165 10*3/uL (ref 140–400)
RBC: 4.42 10*6/uL (ref 3.80–5.10)
RDW: 12.4 % (ref 11.0–15.0)
WBC: 4 10*3/uL (ref 3.8–10.8)

## 2019-11-23 MED ORDER — IOPAMIDOL (ISOVUE-300) INJECTION 61%
100.0000 mL | Freq: Once | INTRAVENOUS | Status: AC | PRN
  Administered 2019-11-23: 100 mL via INTRAVENOUS

## 2019-11-23 NOTE — Assessment & Plan Note (Signed)
Rx augmentin to cover possible diverticulitis. Checking CBC, CMP, lipase. CT abdomen and pelvis with contrast ordered to evaluate. Rx hydrocodone for pain limited supply and [place] narcotic database reviewed and no inappropriate fills.

## 2019-11-23 NOTE — Progress Notes (Signed)
Chronic Care Management Pharmacy Assistant   Name: YASAMIN KAREL  MRN: [id number] DOB: 07-08-1950  Reason for Encounter: Medication Review  Patient Questions:  1.  Have you seen any other providers since your last visit? No  2.  Any changes in your medicines or health? No   PCP : Hoyt Koch, MD  Allergies:  No Known Allergies  Medications: Outpatient Encounter Medications as of 11/23/2019  Medication Sig   albuterol (VENTOLIN HFA) 108 (90 Base) MCG/ACT inhaler Inhale 1-2 puffs into the lungs every 6 (six) hours as needed for wheezing or shortness of breath.   amoxicillin-clavulanate (AUGMENTIN) 875-125 MG tablet Take 1 tablet by mouth 2 (two) times daily.   aspirin 81 MG tablet Take 81 mg by mouth daily.   buPROPion (WELLBUTRIN SR) 150 MG 12 hr tablet Take 1 tab daily X3 days, then increase to 1 tab twice daily.   calcium carbonate (OS-CAL - DOSED IN MG OF ELEMENTAL CALCIUM) 1250 (500 Ca) MG tablet Take 1 tablet by mouth.   cholecalciferol (VITAMIN D) 1000 UNITS tablet Take 1,000 Units by mouth daily.     dicyclomine (BENTYL) 10 MG capsule Take 1 capsule (10 mg total) by mouth 4 (four) times daily -  before meals and at bedtime.   folic acid (FOLVITE) 1 MG tablet Take 1 mg by mouth daily.   gabapentin (NEURONTIN) 300 MG capsule 2 po qam, 2 po afternoon, 3 po qhs   HYDROcodone-acetaminophen (NORCO/VICODIN) 5-325 MG tablet Take 1 tablet by mouth every 6 (six) hours as needed for moderate pain.   hydroxychloroquine (PLAQUENIL) 200 MG tablet Take 200 mg by mouth 2 (two) times daily.   hydrOXYzine (ATARAX/VISTARIL) 10 MG tablet Take 1 tablet (10 mg total) by mouth 3 (three) times daily as needed.   Magnesium-Potassium 250-100 MG TABS Take by mouth.   methotrexate 2.5 MG tablet Take 10 mg by mouth once a week. Thursdays   MULTIPLE VITAMIN PO Take 1 tablet by mouth daily.     Omega-3 1000 MG CAPS Take 1,000 mg by mouth daily.   predniSONE (STERAPRED UNI-PAK 21  TAB) 10 MG (21) TBPK tablet Take by mouth daily. Take steroid taper as written   RESTASIS 0.05 % ophthalmic emulsion INSTILL 1 DROP INTO BOTH EYES TWICE A DAY   saccharomyces boulardii (FLORASTOR) 250 MG capsule Take 250 mg by mouth 2 (two) times daily.   triamcinolone cream (KENALOG) 0.1 % Apply 1 application topically 2 (two) times daily.   triamcinolone ointment (KENALOG) 0.5 % Apply 1 application topically 2 (two) times daily.   umeclidinium-vilanterol (ANORO ELLIPTA) 62.5-25 MCG/INH AEPB INHALE 1 PUFF BY MOUTH EVERY DAY   vitamin C (ASCORBIC ACID) 500 MG tablet Take 500 mg by mouth daily.     vitamin E 1000 UNIT capsule Take 1,000 Units by mouth daily.   zolpidem (AMBIEN) 5 MG tablet TAKE ONE TABLET BY MOUTH EVERYDAY AT BEDTIME AS NEEDED   No facility-administered encounter medications on file as of 11/23/2019.    Current Diagnosis: Patient Active Problem List   Diagnosis Date Noted   Cyst of buttocks 10/05/2019   Methotrexate, long term, current use 07/29/2019   BPPV (benign paroxysmal positional vertigo) 12/16/2017   Insomnia 02/24/2017   COPD (chronic obstructive pulmonary disease) ([place]) 01/22/2017   Routine general medical examination at a health care facility 02/19/2016   Hereditary and idiopathic peripheral neuropathy 02/17/2015   Smokers' cough ([place]) 12/07/2014   Systemic lupus erythematosus ([place]) 10/13/2013   Rash  05/31/2008   Irritable bowel syndrome 03/25/2008   RHEUMATIC FEVER, HX OF 01/17/2007     Goals Addressed   None     Follow-Up:  [facility]    Received call from patient regarding medication management via [facility].  Patient requested an acute fill for Hydrocodone 5mg  - Acetaminophen 325mg   to be delivered: 11/23/2019 Pharmacy needs refills? No  Confirmed delivery date of 11/23/2019, advised patient that pharmacy will contact them the morning of delivery.  Rosendo Gros

## 2019-11-24 ENCOUNTER — Telehealth: Payer: Self-pay

## 2019-11-24 ENCOUNTER — Telehealth: Payer: Self-pay | Admitting: Internal Medicine

## 2019-11-24 NOTE — Telephone Encounter (Signed)
Patient believes that the CT showed a blockage in her colon.  I reviewed the CT and the notes from Dr. Sharlet Salina with the patient.   Sheila Koch, MD  11/24/2019 6:55 AM EDT     There are signs of blood flow blockages in the stomach which are medium to severe. There are signs of some constipation. Has the antibiotic helped at all? If not we may need to get you in with a vascular doctor to see if the blood flow is affecting the pain you are having.    She does have some constipation and will try Miralax 1-2 times a day for this.  She will communicate with Dr. Sharlet Salina about her pain for the next steps.

## 2019-11-24 NOTE — Telephone Encounter (Signed)
Sheila Morales had a ct scan with her PCP yesterday. PCP told Sheila Morales that she may have a blockage and suggested that Sheila Morales have a colonoscopy to clean her out. Sheila Morales reports that she has been constipated for 2 weeks and would like to know if Dr. Carlean Purl would agree with her PCP suggestion of having a procedure or what else she can do. Pls call her.

## 2019-11-24 NOTE — Telephone Encounter (Signed)
New message   Seen on  8.2.21   The patient wants to know could she try MiraLax to help with her situation?

## 2019-11-24 NOTE — Telephone Encounter (Signed)
We need more clinical information as to how she is feeling. See result note.

## 2019-11-24 NOTE — Telephone Encounter (Signed)
Pt states that she just picked up the abx last night. She said she has taken a dose but has not seen a difference yet. Please advise.

## 2019-11-29 ENCOUNTER — Telehealth: Payer: Self-pay | Admitting: Internal Medicine

## 2019-11-29 DIAGNOSIS — M5417 Radiculopathy, lumbosacral region: Secondary | ICD-10-CM | POA: Diagnosis not present

## 2019-11-29 DIAGNOSIS — M5414 Radiculopathy, thoracic region: Secondary | ICD-10-CM | POA: Diagnosis not present

## 2019-11-29 DIAGNOSIS — M531 Cervicobrachial syndrome: Secondary | ICD-10-CM | POA: Diagnosis not present

## 2019-11-29 DIAGNOSIS — I700 Atherosclerosis of aorta: Secondary | ICD-10-CM

## 2019-11-29 DIAGNOSIS — M9902 Segmental and somatic dysfunction of thoracic region: Secondary | ICD-10-CM | POA: Diagnosis not present

## 2019-11-29 DIAGNOSIS — M9901 Segmental and somatic dysfunction of cervical region: Secondary | ICD-10-CM | POA: Diagnosis not present

## 2019-11-29 DIAGNOSIS — R109 Unspecified abdominal pain: Secondary | ICD-10-CM

## 2019-11-29 DIAGNOSIS — M9903 Segmental and somatic dysfunction of lumbar region: Secondary | ICD-10-CM | POA: Diagnosis not present

## 2019-11-29 NOTE — Telephone Encounter (Signed)
CT scan on 8.3.21 Patient wants to know what next steps are...  Please call the patient

## 2019-11-29 NOTE — Telephone Encounter (Signed)
Ive called pt, LVM, to discuss conversation from last week in regard.

## 2019-11-30 NOTE — Telephone Encounter (Signed)
I am not sure what this question is asking

## 2019-11-30 NOTE — Telephone Encounter (Signed)
New message:   Pt states the antibiotic is working but is still concerned about some blockages that she had. Please advise.

## 2019-11-30 NOTE — Telephone Encounter (Addendum)
A response to your CT note/question dated 8/3. She is still concerned about the blockage and would like to move to the next step that you suggested in regard to seeing vascular.

## 2019-12-01 NOTE — Telephone Encounter (Signed)
Referral placed, if pain not improving or still present likely needs repeat visit to discuss at office as vascular will likely not happen immediately.

## 2019-12-01 NOTE — Addendum Note (Signed)
Addended by: Pricilla Holm A on: 12/01/2019 07:31 AM   Modules accepted: Orders

## 2019-12-01 NOTE — Telephone Encounter (Signed)
Called pt, LVM.   

## 2019-12-08 ENCOUNTER — Telehealth: Payer: Self-pay | Admitting: Internal Medicine

## 2019-12-08 NOTE — Telephone Encounter (Signed)
New Message:    LVM for pt to call the office and schedule an appt per phone call earlier today.

## 2019-12-08 NOTE — Telephone Encounter (Signed)
New message:   Pt is calling states she was in the office a couple of weeks ago with stomach issues and they found a blockage so she is taking Miralax to help with it. Pt states she is going twice a day but is still having some trouble with her stomach. Please advise.

## 2019-12-08 NOTE — Telephone Encounter (Signed)
As recommended by PCP in 8/9 phone note. PCP put in referral to vascular and wanted pt to make an OV if symptoms did not improve. VM was left with these instructions also. Please schedule pt an OV. Thanks.

## 2019-12-09 ENCOUNTER — Telehealth: Payer: Self-pay | Admitting: Pharmacist

## 2019-12-09 NOTE — Progress Notes (Signed)
Received call from patient regarding medication management via [facility].  Patient requested an acute fill for zolpidem and gabapentin to be delivered: 12/09/19, however gabapentin cannot be filled yet (last filled 10/01/19 for 90 ds) so only zolpidem will be delivered, patient voiced understanding. Pharmacy needs refills? No  Confirmed delivery date of 12/09/19, advised patient that pharmacy will contact them the morning of delivery.  Charlton Haws, [facility]

## 2019-12-13 DIAGNOSIS — H26492 Other secondary cataract, left eye: Secondary | ICD-10-CM | POA: Diagnosis not present

## 2019-12-16 DIAGNOSIS — R1084 Generalized abdominal pain: Secondary | ICD-10-CM | POA: Diagnosis not present

## 2019-12-16 DIAGNOSIS — M2550 Pain in unspecified joint: Secondary | ICD-10-CM | POA: Diagnosis not present

## 2019-12-16 DIAGNOSIS — M328 Other forms of systemic lupus erythematosus: Secondary | ICD-10-CM | POA: Diagnosis not present

## 2019-12-16 DIAGNOSIS — Z79899 Other long term (current) drug therapy: Secondary | ICD-10-CM | POA: Diagnosis not present

## 2019-12-16 DIAGNOSIS — L932 Other local lupus erythematosus: Secondary | ICD-10-CM | POA: Diagnosis not present

## 2019-12-16 DIAGNOSIS — Z6821 Body mass index (BMI) 21.0-21.9, adult: Secondary | ICD-10-CM | POA: Diagnosis not present

## 2019-12-20 ENCOUNTER — Telehealth: Payer: Self-pay | Admitting: Internal Medicine

## 2019-12-20 ENCOUNTER — Other Ambulatory Visit: Payer: Self-pay | Admitting: Emergency Medicine

## 2019-12-20 NOTE — Telephone Encounter (Signed)
Patient believes that she had a bowel blockage.  I reviewed with her the CT results that were ordered by Dr. Sharlet Salina.  I explained that there was no evidence of colon or small bowel blockage and that it showed stool burden .  She will continue her once a month Miralax _" helps keep me cleaned out"  She will call back for additional questions or concerns.

## 2019-12-24 DIAGNOSIS — Z79899 Other long term (current) drug therapy: Secondary | ICD-10-CM | POA: Diagnosis not present

## 2019-12-29 NOTE — Progress Notes (Signed)
Virtual Visit via Video Note  I connected with Sheila Morales on 12/29/19 at 10:45 AM EDT by a video enabled telemedicine application and verified that I am speaking with the correct person using two identifiers.   I discussed the limitations of evaluation and management by telemedicine and the availability of in person appointments. The patient expressed understanding and agreed to proceed.  Present for the visit:  Myself, Dr Billey Gosling, Mirian Mo.  The patient is currently at home and I am in the office.    No referring provider.    History of Present Illness: She is here for an acute visit for cold symptoms.   Her symptoms started 4 days ago  She is experiencing cough that keeps her up at night.  She occasionally spits up clear phlegm.  She denies other symptoms.  She has not been able to sleep and really just wants a cough syrup that will allow her to sleep.    She has tried taking otc cough medication   Review of Systems  Constitutional: Negative for chills and fever.  HENT: Negative for congestion, ear pain, sinus pain and sore throat.   Respiratory: Positive for cough. Negative for shortness of breath and wheezing.   Neurological: Negative for dizziness and headaches.      Social History   Socioeconomic History   Marital status: Married    Spouse name: Not on file   Number of children: 2   Years of education: 12   Highest education level: Not on file  Occupational History   Occupation: Education administrator business   Occupation: caregiver  Tobacco Use   Smoking status: Current Every Day Smoker    Packs/day: 0.25    Years: 30.00    Pack years: 7.50    Types: Cigarettes   Smokeless tobacco: Never Used   Tobacco comment: Declined Scientist, forensic   Vaping Use: Never used  Substance and Sexual Activity   Alcohol use: Yes    Alcohol/week: 5.0 standard drinks    Types: 5 Standard drinks or equivalent per week    Comment: 2 glasses of wine nightly, 4 beers nightly  on weekend   Drug use: No   Sexual activity: Yes  Other Topics Concern   Not on file  Social History Narrative   HSG. Married '71. 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       One level home with spouse   Right handed   Caffeine - coffee 2-3 cups am   Exercise - treadmill    Education - 12th grade   Social Determinants of Health   Financial Resource Strain:    Difficulty of Paying Living Expenses: Not on file  Food Insecurity:    Worried About [place] in the Last Year: Not on file   [employer] of Food in the Last Year: Not on file  Transportation Needs:    Lack of Transportation (Medical): Not on file   Lack of Transportation (Non-Medical): Not on file  Physical Activity: Sufficiently Active   Days of Exercise per Week: 6 days   Minutes of Exercise per Session: 40 min  Stress:    Feeling of Stress : Not on file  Social Connections:    Frequency of Communication with Friends and Family: Not on file   Frequency of Social Gatherings with Friends and Family: Not on file   Attends Religious Services: Not on  file   Active Member of Clubs or Organizations: Not on file   Attends Archivist Meetings: Not on file   Marital Status: Not on file     Observations/Objective: Appears well in NAD Breathing normally  Assessment and Plan:  See Problem List for Assessment and Plan of chronic medical problems.   Follow Up Instructions:    I discussed the assessment and treatment plan with the patient. The patient was provided an opportunity to ask questions and all were answered. The patient agreed with the plan and demonstrated an understanding of the instructions.   The patient was advised to call back or seek an in-person evaluation if the symptoms worsen or if the condition fails to improve as anticipated.    Binnie Rail, MD

## 2019-12-30 ENCOUNTER — Telehealth (INDEPENDENT_AMBULATORY_CARE_PROVIDER_SITE_OTHER): Payer: PPO | Admitting: Internal Medicine

## 2019-12-30 ENCOUNTER — Encounter: Payer: Self-pay | Admitting: Internal Medicine

## 2019-12-30 DIAGNOSIS — J209 Acute bronchitis, unspecified: Secondary | ICD-10-CM

## 2019-12-30 DIAGNOSIS — R059 Cough, unspecified: Secondary | ICD-10-CM

## 2019-12-30 DIAGNOSIS — R05 Cough: Secondary | ICD-10-CM | POA: Diagnosis not present

## 2019-12-30 MED ORDER — HYDROCOD POLST-CPM POLST ER 10-8 MG/5ML PO SUER: 5.0000 mL | Freq: Two times a day (BID) | ORAL | 0 refills | Status: DC | PRN

## 2019-12-30 NOTE — Assessment & Plan Note (Signed)
Acute Started 4 days ago Possibly viral in nature or related to smoking No concerning symptoms - just not able to sleep Will rx tussionex for a few days to see if that helps No other medication needed.  She has taken this in the past and did well with it

## 2020-01-03 ENCOUNTER — Telehealth: Payer: Self-pay | Admitting: Pharmacist

## 2020-01-03 NOTE — Progress Notes (Signed)
Chronic Care Management Pharmacy Assistant   Name: Sheila Morales  MRN: [id number] DOB: [birth year]  Reason for Encounter: Medication Review  PCP : Hoyt Koch, MD  Allergies:  No Known Allergies  Medications: Outpatient Encounter Medications as of 01/03/2020  Medication Sig  . albuterol (VENTOLIN HFA) 108 (90 Base) MCG/ACT inhaler Inhale 1-2 puffs into the lungs every 6 (six) hours as needed for wheezing or shortness of breath.  Marland Kitchen aspirin 81 MG tablet Take 81 mg by mouth daily.  Marland Kitchen buPROPion (WELLBUTRIN SR) 150 MG 12 hr tablet Take 1 tab daily X3 days, then increase to 1 tab twice daily.  . calcium carbonate (OS-CAL - DOSED IN MG OF ELEMENTAL CALCIUM) 1250 (500 Ca) MG tablet Take 1 tablet by mouth.  . chlorpheniramine-HYDROcodone (TUSSIONEX PENNKINETIC ER) 10-8 MG/5ML SUER Take 5 mLs by mouth every 12 (twelve) hours as needed for cough.  . cholecalciferol (VITAMIN D) 1000 UNITS tablet Take 1,000 Units by mouth daily.    Marland Kitchen dicyclomine (BENTYL) 10 MG capsule Take 1 capsule (10 mg total) by mouth 4 (four) times daily -  before meals and at bedtime.  . folic acid (FOLVITE) 1 MG tablet Take 1 mg by mouth daily.  Marland Kitchen gabapentin (NEURONTIN) 300 MG capsule 2 po qam, 2 po afternoon, 3 po qhs  . HYDROcodone-acetaminophen (NORCO/VICODIN) 5-325 MG tablet Take 1 tablet by mouth every 6 (six) hours as needed for moderate pain.  . hydroxychloroquine (PLAQUENIL) 200 MG tablet Take 200 mg by mouth 2 (two) times daily.  . hydrOXYzine (ATARAX/VISTARIL) 10 MG tablet Take 1 tablet (10 mg total) by mouth 3 (three) times daily as needed.  . Magnesium-Potassium 250-100 MG TABS Take by mouth.  . methotrexate 2.5 MG tablet Take 10 mg by mouth once a week. Thursdays  . MULTIPLE VITAMIN PO Take 1 tablet by mouth daily.    . Omega-3 1000 MG CAPS Take 1,000 mg by mouth daily.  . RESTASIS 0.05 % ophthalmic emulsion INSTILL 1 DROP INTO BOTH EYES TWICE A DAY  . saccharomyces boulardii (FLORASTOR) 250 MG  capsule Take 250 mg by mouth 2 (two) times daily.  Marland Kitchen triamcinolone cream (KENALOG) 0.1 % Apply 1 application topically 2 (two) times daily.  Marland Kitchen triamcinolone ointment (KENALOG) 0.5 % Apply 1 application topically 2 (two) times daily.  Marland Kitchen umeclidinium-vilanterol (ANORO ELLIPTA) 62.5-25 MCG/INH AEPB INHALE ONE PUFF BY MOUTH ONCE DAILY  . vitamin C (ASCORBIC ACID) 500 MG tablet Take 500 mg by mouth daily.    . vitamin E 1000 UNIT capsule Take 1,000 Units by mouth daily.  Marland Kitchen zolpidem (AMBIEN) 5 MG tablet TAKE ONE TABLET BY MOUTH EVERYDAY AT BEDTIME AS NEEDED   No facility-administered encounter medications on file as of 01/03/2020.    Current Diagnosis: Patient Active Problem List   Diagnosis Date Noted  . LLQ pain 11/23/2019  . Cyst of buttocks 10/05/2019  . Methotrexate, long term, current use 07/29/2019  . BPPV (benign paroxysmal positional vertigo) 12/16/2017  . Insomnia 02/24/2017  . COPD (chronic obstructive pulmonary disease) ([place]) 01/22/2017  . Routine general medical examination at a health care facility 02/19/2016  . Hereditary and idiopathic peripheral neuropathy 02/17/2015  . Smokers' cough ([place]) 12/07/2014  . Systemic lupus erythematosus ([place]) 10/13/2013  . Cough 01/18/2009  . Rash 05/31/2008  . Irritable bowel syndrome 03/25/2008  . RHEUMATIC FEVER, HX OF 01/17/2007    Goals Addressed   None     Follow-Up:  [facility]  Received call from patient regarding medication management via [facility].  Patient requested an acute fill for Zolpidem tartrate 5mg , Bupropion 15mg , Gabapentin 300mg ,    to be delivered: 01/04/2020  Pharmacy needs refills? No  Confirmed delivery date of 01/04/2020, advised patient that pharmacy will contact them the morning of delivery.  Rosendo Gros, [place] Pharmacist Assistant  [phone]

## 2020-01-03 NOTE — Progress Notes (Signed)
Chronic Care Management Pharmacy Assistant   Name: ADELEINE PASK  MRN: [id number] DOB: [birth year]  Reason for Encounter: Medication Review   PCP : Hoyt Koch, MD  Allergies:  No Known Allergies  Medications: Outpatient Encounter Medications as of 01/03/2020  Medication Sig   albuterol (VENTOLIN HFA) 108 (90 Base) MCG/ACT inhaler Inhale 1-2 puffs into the lungs every 6 (six) hours as needed for wheezing or shortness of breath.   aspirin 81 MG tablet Take 81 mg by mouth daily.   buPROPion (WELLBUTRIN SR) 150 MG 12 hr tablet Take 1 tab daily X3 days, then increase to 1 tab twice daily.   calcium carbonate (OS-CAL - DOSED IN MG OF ELEMENTAL CALCIUM) 1250 (500 Ca) MG tablet Take 1 tablet by mouth.   chlorpheniramine-HYDROcodone (TUSSIONEX PENNKINETIC ER) 10-8 MG/5ML SUER Take 5 mLs by mouth every 12 (twelve) hours as needed for cough.   cholecalciferol (VITAMIN D) 1000 UNITS tablet Take 1,000 Units by mouth daily.     dicyclomine (BENTYL) 10 MG capsule Take 1 capsule (10 mg total) by mouth 4 (four) times daily -  before meals and at bedtime.   folic acid (FOLVITE) 1 MG tablet Take 1 mg by mouth daily.   gabapentin (NEURONTIN) 300 MG capsule 2 po qam, 2 po afternoon, 3 po qhs   HYDROcodone-acetaminophen (NORCO/VICODIN) 5-325 MG tablet Take 1 tablet by mouth every 6 (six) hours as needed for moderate pain.   hydroxychloroquine (PLAQUENIL) 200 MG tablet Take 200 mg by mouth 2 (two) times daily.   hydrOXYzine (ATARAX/VISTARIL) 10 MG tablet Take 1 tablet (10 mg total) by mouth 3 (three) times daily as needed.   Magnesium-Potassium 250-100 MG TABS Take by mouth.   methotrexate 2.5 MG tablet Take 10 mg by mouth once a week. Thursdays   MULTIPLE VITAMIN PO Take 1 tablet by mouth daily.     Omega-3 1000 MG CAPS Take 1,000 mg by mouth daily.   RESTASIS 0.05 % ophthalmic emulsion INSTILL 1 DROP INTO BOTH EYES TWICE A DAY   saccharomyces boulardii (FLORASTOR) 250 MG  capsule Take 250 mg by mouth 2 (two) times daily.   triamcinolone cream (KENALOG) 0.1 % Apply 1 application topically 2 (two) times daily.   triamcinolone ointment (KENALOG) 0.5 % Apply 1 application topically 2 (two) times daily.   umeclidinium-vilanterol (ANORO ELLIPTA) 62.5-25 MCG/INH AEPB INHALE ONE PUFF BY MOUTH ONCE DAILY   vitamin C (ASCORBIC ACID) 500 MG tablet Take 500 mg by mouth daily.     vitamin E 1000 UNIT capsule Take 1,000 Units by mouth daily.   zolpidem (AMBIEN) 5 MG tablet TAKE ONE TABLET BY MOUTH EVERYDAY AT BEDTIME AS NEEDED   No facility-administered encounter medications on file as of 01/03/2020.    Current Diagnosis: Patient Active Problem List   Diagnosis Date Noted   LLQ pain 11/23/2019   Cyst of buttocks 10/05/2019   Methotrexate, long term, current use 07/29/2019   BPPV (benign paroxysmal positional vertigo) 12/16/2017   Insomnia 02/24/2017   COPD (chronic obstructive pulmonary disease) ([place]) 01/22/2017   Routine general medical examination at a health care facility 02/19/2016   Hereditary and idiopathic peripheral neuropathy 02/17/2015   Smokers' cough ([place]) 12/07/2014   Systemic lupus erythematosus ([place]) 10/13/2013   Cough 01/18/2009   Rash 05/31/2008   Irritable bowel syndrome 03/25/2008   RHEUMATIC FEVER, HX OF 01/17/2007    Goals Addressed   None     Follow-Up:  [facility]  Received call from patient regarding medication management via [facility].  Patient requested an acute fill for Hydrocosone 10mg -Chlorpheniramine 8mg /70mL oral solution  to be delivered: 01/03/2020. Pharmacy needs refills? No  Confirmed delivery date of 01/03/2020, advised patient that pharmacy will contact them the morning of delivery.  Rosendo Gros, [place] Pharmacist Assistant  [phone]

## 2020-01-10 DIAGNOSIS — M9902 Segmental and somatic dysfunction of thoracic region: Secondary | ICD-10-CM | POA: Diagnosis not present

## 2020-01-10 DIAGNOSIS — M5414 Radiculopathy, thoracic region: Secondary | ICD-10-CM | POA: Diagnosis not present

## 2020-01-10 DIAGNOSIS — M5417 Radiculopathy, lumbosacral region: Secondary | ICD-10-CM | POA: Diagnosis not present

## 2020-01-10 DIAGNOSIS — M9903 Segmental and somatic dysfunction of lumbar region: Secondary | ICD-10-CM | POA: Diagnosis not present

## 2020-01-10 DIAGNOSIS — M531 Cervicobrachial syndrome: Secondary | ICD-10-CM | POA: Diagnosis not present

## 2020-01-10 DIAGNOSIS — M9901 Segmental and somatic dysfunction of cervical region: Secondary | ICD-10-CM | POA: Diagnosis not present

## 2020-01-19 ENCOUNTER — Telehealth: Payer: Self-pay | Admitting: Pharmacist

## 2020-01-19 NOTE — Progress Notes (Signed)
° ° °  Chronic Care Management Pharmacy Assistant   Name: TAREKA JHAVERI  MRN: [id number] DOB: 1950/12/10  Reason for Encounter: Chart Review  PCP : Hoyt Koch, MD  Allergies:  No Known Allergies  Medications: Outpatient Encounter Medications as of 01/19/2020  Medication Sig   albuterol (VENTOLIN HFA) 108 (90 Base) MCG/ACT inhaler Inhale 1-2 puffs into the lungs every 6 (six) hours as needed for wheezing or shortness of breath.   aspirin 81 MG tablet Take 81 mg by mouth daily.   buPROPion (WELLBUTRIN SR) 150 MG 12 hr tablet Take 1 tab daily X3 days, then increase to 1 tab twice daily.   calcium carbonate (OS-CAL - DOSED IN MG OF ELEMENTAL CALCIUM) 1250 (500 Ca) MG tablet Take 1 tablet by mouth.   chlorpheniramine-HYDROcodone (TUSSIONEX PENNKINETIC ER) 10-8 MG/5ML SUER Take 5 mLs by mouth every 12 (twelve) hours as needed for cough.   cholecalciferol (VITAMIN D) 1000 UNITS tablet Take 1,000 Units by mouth daily.     dicyclomine (BENTYL) 10 MG capsule Take 1 capsule (10 mg total) by mouth 4 (four) times daily -  before meals and at bedtime.   folic acid (FOLVITE) 1 MG tablet Take 1 mg by mouth daily.   gabapentin (NEURONTIN) 300 MG capsule 2 po qam, 2 po afternoon, 3 po qhs   HYDROcodone-acetaminophen (NORCO/VICODIN) 5-325 MG tablet Take 1 tablet by mouth every 6 (six) hours as needed for moderate pain.   hydroxychloroquine (PLAQUENIL) 200 MG tablet Take 200 mg by mouth 2 (two) times daily.   hydrOXYzine (ATARAX/VISTARIL) 10 MG tablet Take 1 tablet (10 mg total) by mouth 3 (three) times daily as needed.   Magnesium-Potassium 250-100 MG TABS Take by mouth.   methotrexate 2.5 MG tablet Take 10 mg by mouth once a week. Thursdays   MULTIPLE VITAMIN PO Take 1 tablet by mouth daily.     Omega-3 1000 MG CAPS Take 1,000 mg by mouth daily.   RESTASIS 0.05 % ophthalmic emulsion INSTILL 1 DROP INTO BOTH EYES TWICE A DAY   saccharomyces boulardii (FLORASTOR) 250 MG capsule  Take 250 mg by mouth 2 (two) times daily.   triamcinolone cream (KENALOG) 0.1 % Apply 1 application topically 2 (two) times daily.   triamcinolone ointment (KENALOG) 0.5 % Apply 1 application topically 2 (two) times daily.   umeclidinium-vilanterol (ANORO ELLIPTA) 62.5-25 MCG/INH AEPB INHALE ONE PUFF BY MOUTH ONCE DAILY   vitamin C (ASCORBIC ACID) 500 MG tablet Take 500 mg by mouth daily.     vitamin E 1000 UNIT capsule Take 1,000 Units by mouth daily.   zolpidem (AMBIEN) 5 MG tablet TAKE ONE TABLET BY MOUTH EVERYDAY AT BEDTIME AS NEEDED   No facility-administered encounter medications on file as of 01/19/2020.    Current Diagnosis: Patient Active Problem List   Diagnosis Date Noted   LLQ pain 11/23/2019   Cyst of buttocks 10/05/2019   Methotrexate, long term, current use 07/29/2019   BPPV (benign paroxysmal positional vertigo) 12/16/2017   Insomnia 02/24/2017   COPD (chronic obstructive pulmonary disease) ([place]) 01/22/2017   Routine general medical examination at a health care facility 02/19/2016   Hereditary and idiopathic peripheral neuropathy 02/17/2015   Smokers' cough ([place]) 12/07/2014   Systemic lupus erythematosus ([place]) 10/13/2013   Cough 01/18/2009   Rash 05/31/2008   Irritable bowel syndrome 03/25/2008   RHEUMATIC FEVER, HX OF 01/17/2007    Goals Addressed   None     Follow-Up:  Pharmacist Review

## 2020-01-21 ENCOUNTER — Other Ambulatory Visit (INDEPENDENT_AMBULATORY_CARE_PROVIDER_SITE_OTHER): Payer: PPO

## 2020-01-21 ENCOUNTER — Other Ambulatory Visit: Payer: Self-pay

## 2020-01-21 ENCOUNTER — Telehealth: Payer: Self-pay | Admitting: Internal Medicine

## 2020-01-21 ENCOUNTER — Ambulatory Visit (INDEPENDENT_AMBULATORY_CARE_PROVIDER_SITE_OTHER): Payer: PPO | Admitting: Family

## 2020-01-21 ENCOUNTER — Other Ambulatory Visit: Payer: Self-pay | Admitting: Family

## 2020-01-21 VITALS — BP 180/82 | HR 48 | Temp 98.5°F | Ht 63.5 in | Wt 124.6 lb

## 2020-01-21 DIAGNOSIS — R001 Bradycardia, unspecified: Secondary | ICD-10-CM

## 2020-01-21 DIAGNOSIS — R410 Disorientation, unspecified: Secondary | ICD-10-CM

## 2020-01-21 DIAGNOSIS — E871 Hypo-osmolality and hyponatremia: Secondary | ICD-10-CM

## 2020-01-21 DIAGNOSIS — R9431 Abnormal electrocardiogram [ECG] [EKG]: Secondary | ICD-10-CM

## 2020-01-21 LAB — COMPREHENSIVE METABOLIC PANEL
ALT: 29 U/L (ref 0–35)
AST: 33 U/L (ref 0–37)
Albumin: 4.1 g/dL (ref 3.5–5.2)
Alkaline Phosphatase: 77 U/L (ref 39–117)
BUN: 13 mg/dL (ref 6–23)
CO2: 29 mEq/L (ref 19–32)
Calcium: 9.8 mg/dL (ref 8.4–10.5)
Chloride: 93 mEq/L — ABNORMAL LOW (ref 96–112)
Creatinine, Ser: 0.89 mg/dL (ref 0.40–1.20)
GFR: 62.66 mL/min (ref >60.00)
Glucose, Bld: 91 mg/dL (ref 70–99)
Potassium: 4.3 mEq/L (ref 3.5–5.1)
Sodium: 128 mEq/L — ABNORMAL LOW (ref 135–145)
Total Bilirubin: 0.6 mg/dL (ref 0.2–1.2)
Total Protein: 7.5 g/dL (ref 6.0–8.3)

## 2020-01-21 LAB — CBC WITH DIFFERENTIAL/PLATELET
Basophils Absolute: 0 10*3/uL (ref 0.0–0.1)
Basophils Relative: 0.9 % (ref 0.0–3.0)
Eosinophils Absolute: 0.1 10*3/uL (ref 0.0–0.7)
Eosinophils Relative: 1.7 % (ref 0.0–5.0)
HCT: 38.6 % (ref 36.0–46.0)
Hemoglobin: 13.1 g/dL (ref 12.0–15.0)
Lymphocytes Relative: 21.8 % (ref 12.0–46.0)
Lymphs Abs: 0.9 10*3/uL (ref 0.7–4.0)
MCHC: 34 g/dL (ref 30.0–36.0)
MCV: 96.7 fl (ref 78.0–100.0)
Monocytes Absolute: 0.7 10*3/uL (ref 0.1–1.0)
Monocytes Relative: 16.6 % — ABNORMAL HIGH (ref 3.0–12.0)
Neutro Abs: 2.5 10*3/uL (ref 1.4–7.7)
Neutrophils Relative %: 59 % (ref 43.0–77.0)
Platelets: 206 10*3/uL (ref 150.0–400.0)
RBC: 3.99 Mil/uL (ref 3.87–5.11)
RDW: 14.4 % (ref 11.5–15.5)
WBC: 4.2 10*3/uL (ref 4.0–10.5)

## 2020-01-21 MED ORDER — AMLODIPINE BESYLATE 2.5 MG PO TABS
2.5000 mg | ORAL_TABLET | Freq: Every day | ORAL | 0 refills | Status: DC
Start: 2020-01-21 — End: 2020-02-08

## 2020-01-21 NOTE — Telephone Encounter (Signed)
Her medications were sent to [place] as requested; pharmacy confirmed receipt by 1:00 pm today; not sure what this message means. Please call and clarify what she needs.

## 2020-01-21 NOTE — Progress Notes (Signed)
Sheila Morales is a 69 y.o. female with the following history as recorded in EpicCare:  Patient Active Problem List   Diagnosis Date Noted  . LLQ pain 11/23/2019  . Cyst of buttocks 10/05/2019  . Methotrexate, long term, current use 07/29/2019  . BPPV (benign paroxysmal positional vertigo) 12/16/2017  . Insomnia 02/24/2017  . COPD (chronic obstructive pulmonary disease) ([place]) 01/22/2017  . Routine general medical examination at a health care facility 02/19/2016  . Hereditary and idiopathic peripheral neuropathy 02/17/2015  . Smokers' cough ([place]) 12/07/2014  . Systemic lupus erythematosus ([place]) 10/13/2013  . Cough 01/18/2009  . Rash 05/31/2008  . Irritable bowel syndrome 03/25/2008  . RHEUMATIC FEVER, HX OF 01/17/2007    Current Outpatient Medications  Medication Sig Dispense Refill  . albuterol (VENTOLIN HFA) 108 (90 Base) MCG/ACT inhaler Inhale 1-2 puffs into the lungs every 6 (six) hours as needed for wheezing or shortness of breath. 8 g 2  . aspirin 81 MG tablet Take 81 mg by mouth daily.    Marland Kitchen buPROPion (WELLBUTRIN SR) 150 MG 12 hr tablet Take 1 tab daily X3 days, then increase to 1 tab twice daily. 60 tablet 2  . calcium carbonate (OS-CAL - DOSED IN MG OF ELEMENTAL CALCIUM) 1250 (500 Ca) MG tablet Take 1 tablet by mouth.    . chlorpheniramine-HYDROcodone (TUSSIONEX PENNKINETIC ER) 10-8 MG/5ML SUER Take 5 mLs by mouth every 12 (twelve) hours as needed for cough. 100 mL 0  . cholecalciferol (VITAMIN D) 1000 UNITS tablet Take 1,000 Units by mouth daily.      Marland Kitchen dicyclomine (BENTYL) 10 MG capsule Take 1 capsule (10 mg total) by mouth 4 (four) times daily -  before meals and at bedtime. 90 capsule 0  . folic acid (FOLVITE) 1 MG tablet Take 1 mg by mouth daily.    Marland Kitchen gabapentin (NEURONTIN) 300 MG capsule 2 po qam, 2 po afternoon, 3 po qhs 630 capsule 1  . HYDROcodone-acetaminophen (NORCO/VICODIN) 5-325 MG tablet Take 1 tablet by mouth every 6 (six) hours as needed for moderate pain. 30  tablet 0  . hydroxychloroquine (PLAQUENIL) 200 MG tablet Take 200 mg by mouth 2 (two) times daily.    . hydrOXYzine (ATARAX/VISTARIL) 10 MG tablet Take 1 tablet (10 mg total) by mouth 3 (three) times daily as needed. 45 tablet 0  . Magnesium-Potassium 250-100 MG TABS Take by mouth.    . methotrexate 2.5 MG tablet Take 10 mg by mouth once a week. Thursdays    . MULTIPLE VITAMIN PO Take 1 tablet by mouth daily.      . Omega-3 1000 MG CAPS Take 1,000 mg by mouth daily.    . RESTASIS 0.05 % ophthalmic emulsion INSTILL 1 DROP INTO BOTH EYES TWICE A DAY  6  . saccharomyces boulardii (FLORASTOR) 250 MG capsule Take 250 mg by mouth 2 (two) times daily.    Marland Kitchen triamcinolone cream (KENALOG) 0.1 % Apply 1 application topically 2 (two) times daily. 30 g 0  . triamcinolone ointment (KENALOG) 0.5 % Apply 1 application topically 2 (two) times daily. 30 g 0  . umeclidinium-vilanterol (ANORO ELLIPTA) 62.5-25 MCG/INH AEPB INHALE ONE PUFF BY MOUTH ONCE DAILY 60 each 5  . vitamin C (ASCORBIC ACID) 500 MG tablet Take 500 mg by mouth daily.      . vitamin E 1000 UNIT capsule Take 1,000 Units by mouth daily.    Marland Kitchen zolpidem (AMBIEN) 5 MG tablet TAKE ONE TABLET BY MOUTH EVERYDAY AT BEDTIME AS NEEDED 30  tablet 5  . amLODipine (NORVASC) 2.5 MG tablet Take 1 tablet (2.5 mg total) by mouth daily. 30 tablet 0   No current facility-administered medications for this visit.    Allergies: Patient has no known allergies.  Past Medical History:  Diagnosis Date  . Acute respiratory failure with hypoxia ([place]) 11/05/2017  . Arthritis   . Colitis, ischemic ([place])   . External hemorrhoids   . H/O: rheumatic fever   . IBS (irritable bowel syndrome)   . Personal history colonic adenoma 03/25/2008   06/2007 right sided adenoma and 2 right hyperplastic polyps    . Restless leg syndrome    questionable  . Systemic lupus erythematosus ([place])   . Tubular adenoma of colon   . 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     Past Surgical  History:  Procedure Laterality Date  . CHOLECYSTECTOMY    . CHOLECYSTECTOMY, LAPAROSCOPIC    . COLONOSCOPY    . ESOPHAGOGASTRODUODENOSCOPY    . lesion, vulva excision    . TONSILLECTOMY    . VIDEO BRONCHOSCOPY Bilateral 11/06/2017   Procedure: VIDEO BRONCHOSCOPY WITHOUT FLUORO;  Surgeon: Marshell Garfinkel, MD;  Location: [facility];  Service: Cardiopulmonary;  Laterality: Bilateral;    Family History  Problem Relation Age of Onset  . Stroke Mother        Deceased, 71s  . Atrial fibrillation Father   . Colon cancer Father   . Other Sister        Pick's disease, deceased  . Healthy Daughter     Social History   Tobacco Use  . Smoking status: Current Every Day Smoker    Packs/day: 0.25    Years: 30.00    Pack years: 7.50    Types: Cigarettes  . Smokeless tobacco: Never Used  . Tobacco comment: Declined info  Substance Use Topics  . Alcohol use: Yes    Alcohol/week: 5.0 standard drinks    Types: 5 Standard drinks or equivalent per week    Comment: 2 glasses of wine nightly, 4 beers nightly on weekend    Subjective:  Patient presents with concerns for recent episodes of confusion/ tremors; notes that she is just feeling more confused recently; has no prior history of hypertension- is very surprised to see her blood pressure elevated today; no chest pain or shortness of breath;   Objective:  Vitals:   01/21/20 1033  BP: (!) 180/82  Pulse: (!) 48  Temp: 98.5 F (36.9 C)  TempSrc: Oral  SpO2: 95%  Weight: 124 lb 9.6 oz (56.5 kg)  Height: 5' 3.5" (1.613 m)    General: Well developed, well nourished, in no acute distress  Skin : Warm and dry.  Head: Normocephalic and atraumatic  Eyes: Sclera and conjunctiva clear; pupils round and reactive to light; extraocular movements intact  Ears: External normal; canals clear; tympanic membranes normal  Oropharynx: Pink, supple. No suspicious lesions  Neck: Supple without thyromegaly, adenopathy  Lungs: Respirations unlabored;  clear to auscultation bilaterally without wheeze, rales, rhonchi  CVS exam: regular rhythm, bradycardia Neurologic: Alert and oriented; speech intact; face symmetrical; moves all extremities well; CNII-XII intact without focal deficit   Assessment:  1. Hyponatremia   2. Bradycardia   3. Confusion     Plan:  Concern about sudden change in patient's heart rate- it was checked 3 different times in our office and EKG showed bradycardia with pulse averaging between 48-50; urgent referral to cardiology; start Amlodipine 2.5 mg bid in the interim;  Repeat  sodium is better at 128 today- do not feel ER warranted; If cardiology exam is unremarkable, will need to get to neurology;   This visit occurred during the SARS-CoV-2 public health emergency.  Safety protocols were in place, including screening questions prior to the visit, additional usage of staff PPE, and extensive cleaning of exam room while observing appropriate contact time as indicated for disinfecting solutions.      No follow-ups on file.  Orders Placed This Encounter  Procedures  . CBC with Differential/Platelet    Standing Status:   Future    Number of Occurrences:   1    Standing Expiration Date:   01/20/2021  . Comp Met (CMET)    Standing Status:   Future    Number of Occurrences:   1    Standing Expiration Date:   01/20/2021  . EKG 12-Lead    Requested Prescriptions    No prescriptions requested or ordered in this encounter

## 2020-01-21 NOTE — Telephone Encounter (Signed)
Patient called and said that her new prescriptions were sent to the wrong pharmacy. The medications are hyponatremia, bardycardia. They needed to be sent to [id number] - [place], [place]

## 2020-01-24 ENCOUNTER — Telehealth: Payer: Self-pay | Admitting: Pharmacist

## 2020-01-24 ENCOUNTER — Telehealth: Payer: Self-pay | Admitting: Internal Medicine

## 2020-01-24 NOTE — Telephone Encounter (Signed)
How is her blood pressure doing with the Amlodipine?  The cardiologist office will review my notes and they will decide how quickly to get the patient seen regardless of what I may request. She can also see if they have a cancellation list in the hopes of being seen sooner.

## 2020-01-24 NOTE — Telephone Encounter (Signed)
Left voicemail informing patient of where here medication has been sent to.

## 2020-01-24 NOTE — Telephone Encounter (Signed)
Cardiology appt 10.25.21 Patient wants to make sure that this is okay since was told by the Provider that she needed an appointment within one week

## 2020-01-24 NOTE — Progress Notes (Signed)
Chronic Care Management Pharmacy Assistant   Name: Sheila Morales  MRN: [id number] DOB: November 07, 1950  Reason for Encounter: Medication Review  Patient Questions:  1.  Have you seen any other providers since your last visit? No  2.  Any changes in your medicines or health? No   PCP : Hoyt Koch, MD  Allergies:  No Known Allergies  Medications: Outpatient Encounter Medications as of 01/24/2020  Medication Sig  . albuterol (VENTOLIN HFA) 108 (90 Base) MCG/ACT inhaler Inhale 1-2 puffs into the lungs every 6 (six) hours as needed for wheezing or shortness of breath.  Marland Kitchen amLODipine (NORVASC) 2.5 MG tablet Take 1 tablet (2.5 mg total) by mouth daily.  Marland Kitchen aspirin 81 MG tablet Take 81 mg by mouth daily.  Marland Kitchen buPROPion (WELLBUTRIN SR) 150 MG 12 hr tablet Take 1 tab daily X3 days, then increase to 1 tab twice daily.  . calcium carbonate (OS-CAL - DOSED IN MG OF ELEMENTAL CALCIUM) 1250 (500 Ca) MG tablet Take 1 tablet by mouth.  . chlorpheniramine-HYDROcodone (TUSSIONEX PENNKINETIC ER) 10-8 MG/5ML SUER Take 5 mLs by mouth every 12 (twelve) hours as needed for cough.  . cholecalciferol (VITAMIN D) 1000 UNITS tablet Take 1,000 Units by mouth daily.    Marland Kitchen dicyclomine (BENTYL) 10 MG capsule Take 1 capsule (10 mg total) by mouth 4 (four) times daily -  before meals and at bedtime.  . folic acid (FOLVITE) 1 MG tablet Take 1 mg by mouth daily.  Marland Kitchen gabapentin (NEURONTIN) 300 MG capsule 2 po qam, 2 po afternoon, 3 po qhs  . HYDROcodone-acetaminophen (NORCO/VICODIN) 5-325 MG tablet Take 1 tablet by mouth every 6 (six) hours as needed for moderate pain.  . hydroxychloroquine (PLAQUENIL) 200 MG tablet Take 200 mg by mouth 2 (two) times daily.  . hydrOXYzine (ATARAX/VISTARIL) 10 MG tablet Take 1 tablet (10 mg total) by mouth 3 (three) times daily as needed.  . Magnesium-Potassium 250-100 MG TABS Take by mouth.  . methotrexate 2.5 MG tablet Take 10 mg by mouth once a week. Thursdays  . MULTIPLE VITAMIN PO  Take 1 tablet by mouth daily.    . Omega-3 1000 MG CAPS Take 1,000 mg by mouth daily.  . RESTASIS 0.05 % ophthalmic emulsion INSTILL 1 DROP INTO BOTH EYES TWICE A DAY  . saccharomyces boulardii (FLORASTOR) 250 MG capsule Take 250 mg by mouth 2 (two) times daily.  Marland Kitchen triamcinolone cream (KENALOG) 0.1 % Apply 1 application topically 2 (two) times daily.  Marland Kitchen triamcinolone ointment (KENALOG) 0.5 % Apply 1 application topically 2 (two) times daily.  Marland Kitchen umeclidinium-vilanterol (ANORO ELLIPTA) 62.5-25 MCG/INH AEPB INHALE ONE PUFF BY MOUTH ONCE DAILY  . vitamin C (ASCORBIC ACID) 500 MG tablet Take 500 mg by mouth daily.    . vitamin E 1000 UNIT capsule Take 1,000 Units by mouth daily.  Marland Kitchen zolpidem (AMBIEN) 5 MG tablet TAKE ONE TABLET BY MOUTH EVERYDAY AT BEDTIME AS NEEDED   No facility-administered encounter medications on file as of 01/24/2020.    Current Diagnosis: Patient Active Problem List   Diagnosis Date Noted  . LLQ pain 11/23/2019  . Cyst of buttocks 10/05/2019  . Methotrexate, long term, current use 07/29/2019  . BPPV (benign paroxysmal positional vertigo) 12/16/2017  . Insomnia 02/24/2017  . COPD (chronic obstructive pulmonary disease) ([place]) 01/22/2017  . Routine general medical examination at a health care facility 02/19/2016  . Hereditary and idiopathic peripheral neuropathy 02/17/2015  . Smokers' cough ([place]) 12/07/2014  . Systemic lupus  erythematosus ([place]) 10/13/2013  . Cough 01/18/2009  . Rash 05/31/2008  . Irritable bowel syndrome 03/25/2008  . RHEUMATIC FEVER, HX OF 01/17/2007    Goals Addressed   None     Follow-Up:  Coordination of Enhanced Pharmacy Services    Reviewed chart for medication changes ahead of medication coordination call.  No OVs, Consults, or hospital visits since last care coordination call/Pharmacist visit.  No medication changes indicated   BP Readings from Last 3 Encounters:  01/21/20 (!) 180/82  11/22/19 116/74  11/07/19 (!) 173/70    Lab  Results  Component Value Date   HGBA1C 5.7 02/24/2017     Patient obtains medications through Vials  90 Days   Last adherence delivery included:   Zolpidem Tartrate 5mg  daily as needed -30DS Bupropion 150mg   twice daily-30DS  Patient is due for next adherence delivery on: 02/02/2020. Called patient and reviewed medications and coordinated delivery.  This delivery to include:  Zolpidem Tartrate 5mg  daily as needed -30DS Bupropion 150mg   twice daily-30DS Albuterol Aerosol Hfa  Folic Acid 1mg  daily Plaquenil 20 mg daily    Confirmed delivery date of 02/02/2020, advised patient that pharmacy will contact them the morning of delivery.   Rosendo Gros, [facility]  Practice Team Manager/ CPA (Clinical Pharmacist Assistant) [phone]

## 2020-01-25 ENCOUNTER — Ambulatory Visit: Payer: PPO | Admitting: Pharmacist

## 2020-01-25 ENCOUNTER — Other Ambulatory Visit: Payer: Self-pay

## 2020-01-25 DIAGNOSIS — J410 Simple chronic bronchitis: Secondary | ICD-10-CM

## 2020-01-25 DIAGNOSIS — J449 Chronic obstructive pulmonary disease, unspecified: Secondary | ICD-10-CM

## 2020-01-25 NOTE — Progress Notes (Signed)
Spoke with patient regarding BP and new amlodipine rx. Patient endorses compliance and denies issues with medication. She reports home BP was 135/82 today, HR 64.

## 2020-01-25 NOTE — Patient Instructions (Addendum)
Visit Information  Phone number for Pharmacist: [phone]  Goals Addressed            This Visit's Progress   . Pharmacy Care Plan       CARE PLAN ENTRY (see longitudinal plan of care for additional care plan information)  Current Barriers:  . Chronic Disease Management support, education, and care coordination needs related to Hypertension, COPD, and Tobacco use   Hypertension BP Readings from Last 3 Encounters:  01/21/20 (!) 180/82  11/22/19 116/74  11/07/19 (!) 173/70 .  Pharmacist Clinical Goal(s): o Over the next 180 days, patient will work with PharmD and providers to maintain BP goal <130/80 . Current regimen:  o Amlodipine 2.5 mg BID . Interventions: o Discussed BP goals and benefits of medications for prevention of heart attack / stroke . Patient self care activities - Over the next 180 days, patient will: o Check BP daily, document, and provide at future appointments o Ensure daily salt intake < 2300 mg/day o Keep cardiology appt as scheduled  COPD . Pharmacist Clinical Goal(s) o Over the next 180 days, patient will work with PharmD and providers to optimize therapy . Current regimen:  o Anoro Ellipta 1 puff daily o Albuterol HFA as needed . Interventions: o Discussed role of inhaler and importance of adherence to control symptoms and slow progression of disease . Patient self care activities - Over the next 180 days, patient will: o Continue inhalers as directed  Smoking cessation . Pharmacist Clinical Goal(s) o Over the next 180 days, patient will work with PharmD and providers to quit smoking . Current regimen:  o Bupropion SR 150 mg twice a day . Interventions: o Discussed benefits of bupropion to help quit smoking . Patient self care activities - Over the next 180 days, patient will: o Continue medication as directed o Reduce number of cigarettes per day  Medication management . Pharmacist Clinical Goal(s): o Over the next 180 days, patient  will work with PharmD and providers to achieve optimal medication adherence . Current pharmacy: [facility]  . Interventions o Comprehensive medication review performed. o Utilize [facility] pharmacy for medication synchronization, packaging and delivery . Patient self care activities - Over the next 180 days, patient will: o Focus on medication adherence by pill box o Take medications as prescribed o Report any questions or concerns to PharmD and/or provider(s)  Initial goal documentation      Patient verbalizes understanding of instructions provided today.  Telephone follow up appointment with pharmacy team member scheduled for: 6 months  Charlene Brooke, PharmD, BCACP Clinical Pharmacist [facility] [phone]  Coping with Quitting Smoking  Quitting smoking is a physical and mental challenge. You will face cravings, withdrawal symptoms, and temptation. Before quitting, work with your health care provider to make a plan that can help you cope. Preparation can help you quit and keep you from giving in. How can I cope with cravings? Cravings usually last for 5-10 minutes. If you get through it, the craving will pass. Consider taking the following actions to help you cope with cravings:  Keep your mouth busy: ? Chew sugar-free gum. ? Suck on hard candies or a straw. ? Brush your teeth.  Keep your hands and body busy: ? Immediately change to a different activity when you feel a craving. ? Squeeze or play with a ball. ? Do an activity or a hobby, like making bead jewelry, practicing needlepoint, or working with wood. ? Mix up your normal  routine. ? Take a short exercise break. Go for a quick walk or run up and down stairs. ? Spend time in public places where smoking is not allowed.  Focus on doing something kind or helpful for someone else.  Call a friend or family member to talk during a craving.  Join a support group.  Call a quit line, such as  1-800-QUIT-NOW.  Talk with your health care provider about medicines that might help you cope with cravings and make quitting easier for you. How can I deal with withdrawal symptoms? Your body may experience negative effects as it tries to get used to not having nicotine in the system. These effects are called withdrawal symptoms. They may include:  Feeling hungrier than normal.  Trouble concentrating.  Irritability.  Trouble sleeping.  Feeling depressed.  Restlessness and agitation.  Craving a cigarette. To manage withdrawal symptoms:  Avoid places, people, and activities that trigger your cravings.  Remember why you want to quit.  Get plenty of sleep.  Avoid coffee and other caffeinated drinks. These may worsen some of your symptoms. How can I handle social situations? Social situations can be difficult when you are quitting smoking, especially in the first few weeks. To manage this, you can:  Avoid parties, bars, and other social situations where people might be smoking.  Avoid alcohol.  Leave right away if you have the urge to smoke.  Explain to your family and friends that you are quitting smoking. Ask for understanding and support.  Plan activities with friends or family where smoking is not an option. What are some ways I can cope with stress? Wanting to smoke may cause stress, and stress can make you want to smoke. Find ways to manage your stress. Relaxation techniques can help. For example:  Breathe slowly and deeply, in through your nose and out through your mouth.  Listen to soothing, relaxing music.  Talk with a family member or friend about your stress.  Light a candle.  Soak in a bath or take a shower.  Think about a peaceful place. What are some ways I can prevent weight gain? Be aware that many people gain weight after they quit smoking. However, not everyone does. To keep from gaining weight, have a plan in place before you quit and stick to the  plan after you quit. Your plan should include:  Having healthy snacks. When you have a craving, it may help to: ? Eat plain popcorn, crunchy carrots, celery, or other cut vegetables. ? Chew sugar-free gum.  Changing how you eat: ? Eat small portion sizes at meals. ? Eat 4-6 small meals throughout the day instead of 1-2 large meals a day. ? Be mindful when you eat. Do not watch television or do other things that might distract you as you eat.  Exercising regularly: ? Make time to exercise each day. If you do not have time for a long workout, do short bouts of exercise for 5-10 minutes several times a day. ? Do some form of strengthening exercise, like weight lifting, and some form of aerobic exercise, like running or swimming.  Drinking plenty of water or other low-calorie or no-calorie drinks. Drink 6-8 glasses of water daily, or as much as instructed by your health care provider. Summary  Quitting smoking is a physical and mental challenge. You will face cravings, withdrawal symptoms, and temptation to smoke again. Preparation can help you as you go through these challenges.  You can cope with cravings by keeping  your mouth busy (such as by chewing gum), keeping your body and hands busy, and making calls to family, friends, or a helpline for people who want to quit smoking.  You can cope with withdrawal symptoms by avoiding places where people smoke, avoiding drinks with caffeine, and getting plenty of rest.  Ask your health care provider about the different ways to prevent weight gain, avoid stress, and handle social situations. This information is not intended to replace advice given to you by your health care provider. Make sure you discuss any questions you have with your health care provider. Document Revised: 03/21/2017 Document Reviewed: 04/05/2016 Elsevier Patient Education  2020 [employer].

## 2020-01-25 NOTE — Telephone Encounter (Signed)
Patient voicemail reached twice no message left.

## 2020-01-25 NOTE — Chronic Care Management (AMB) (Signed)
Chronic Care Management Pharmacy  Name: Sheila Morales  MRN: [id number] DOB: 1951-04-19   Chief Complaint/ HPI  Sheila Morales,  69 y.o. , female presents for their Follow-Up CCM visit with the clinical pharmacist via telephone due to COVID-19 Pandemic.  PCP : Hoyt Koch, MD  Their chronic conditions include: COPD, IBS, neuropathy, BPPV, lupus, insomnia  Neuropathy is biggest issue. Walks 0.25 mile every day.  Keto diet: 134 lbs down to 113. But also worsening BM.   Office Visits: 01/21/20 NP Jodi Mourning OV: acute visit for confusion/tremors. BP elevated in office 180/82, HR 48. Rx'd amlodipine 2.5 mg BID and urgent referral placed to cardiology. Repeat Na 128.  12/30/19 Dr Quay Burow VV: cold sx, possibly viral, rx'd tussionex. 11/22/19 Dr Sharlet Salina OV: possible diverticulitis, rx'd hydrocodone, Augmentin. Na mildly low, add gatorade or pedialyte to diet.  11/05/19 Dr Sharlet Salina OV: acute visit for rash, given depo-medrol, hydroxyzine.   09/27/19 Dr Sharlet Salina OV: chronic f/u, counseled to quit smoking, pt not ready yet. Refilled gabapentin, dicyclomine. Rec'd claritin for sinus drainage  03/29/19 Dr Sharlet Salina OV: chronic cough, smoking more during pandemic, not ready to quit. Rec'd sennakot OTC for constipation.  Consult Visit: 07/29/19 Dr Lamonte Sakai (pulmonary): pt has decreased smoking. Rec'd COVID vaccine. No changes to inhalers.   02/08/19 Dr Posey Pronto (neurology): neuropathy d/t alcohol and lupus. Symptoms controlled on gabapentin. No changes.  12/01/18 Dr Carlean Purl (GI): Hx IBS-D however alesetron tx switched to constipation. Rec'd bowel purge - Miralax, dulcolax. Then take Benefiber daily. Also rec'd taper off pantoprazole.   Medications: Outpatient Encounter Medications as of 01/25/2020  Medication Sig  . albuterol (VENTOLIN HFA) 108 (90 Base) MCG/ACT inhaler Inhale 1-2 puffs into the lungs every 6 (six) hours as needed for wheezing or shortness of breath.  Marland Kitchen amLODipine (NORVASC) 2.5 MG  tablet Take 1 tablet (2.5 mg total) by mouth daily.  Marland Kitchen aspirin 81 MG tablet Take 81 mg by mouth daily.  Marland Kitchen buPROPion (WELLBUTRIN SR) 150 MG 12 hr tablet Take 1 tab daily X3 days, then increase to 1 tab twice daily.  . calcium carbonate (OS-CAL - DOSED IN MG OF ELEMENTAL CALCIUM) 1250 (500 Ca) MG tablet Take 1 tablet by mouth.  . chlorpheniramine-HYDROcodone (TUSSIONEX PENNKINETIC ER) 10-8 MG/5ML SUER Take 5 mLs by mouth every 12 (twelve) hours as needed for cough.  . cholecalciferol (VITAMIN D) 1000 UNITS tablet Take 1,000 Units by mouth daily.    Marland Kitchen dicyclomine (BENTYL) 10 MG capsule Take 1 capsule (10 mg total) by mouth 4 (four) times daily -  before meals and at bedtime.  . folic acid (FOLVITE) 1 MG tablet Take 1 mg by mouth daily.  Marland Kitchen gabapentin (NEURONTIN) 300 MG capsule 2 po qam, 2 po afternoon, 3 po qhs  . HYDROcodone-acetaminophen (NORCO/VICODIN) 5-325 MG tablet Take 1 tablet by mouth every 6 (six) hours as needed for moderate pain.  . hydroxychloroquine (PLAQUENIL) 200 MG tablet Take 200 mg by mouth 2 (two) times daily.  . hydrOXYzine (ATARAX/VISTARIL) 10 MG tablet Take 1 tablet (10 mg total) by mouth 3 (three) times daily as needed.  . Magnesium-Potassium 250-100 MG TABS Take by mouth.  . methotrexate 2.5 MG tablet Take 10 mg by mouth once a week. Thursdays  . MULTIPLE VITAMIN PO Take 1 tablet by mouth daily.    . Omega-3 1000 MG CAPS Take 1,000 mg by mouth daily.  . RESTASIS 0.05 % ophthalmic emulsion INSTILL 1 DROP INTO BOTH EYES TWICE A DAY  .  saccharomyces boulardii (FLORASTOR) 250 MG capsule Take 250 mg by mouth 2 (two) times daily.  Marland Kitchen triamcinolone cream (KENALOG) 0.1 % Apply 1 application topically 2 (two) times daily.  Marland Kitchen triamcinolone ointment (KENALOG) 0.5 % Apply 1 application topically 2 (two) times daily.  Marland Kitchen umeclidinium-vilanterol (ANORO ELLIPTA) 62.5-25 MCG/INH AEPB INHALE ONE PUFF BY MOUTH ONCE DAILY  . vitamin C (ASCORBIC ACID) 500 MG tablet Take 500 mg by mouth daily.      . vitamin E 1000 UNIT capsule Take 1,000 Units by mouth daily.  Marland Kitchen zolpidem (AMBIEN) 5 MG tablet TAKE ONE TABLET BY MOUTH EVERYDAY AT BEDTIME AS NEEDED   No facility-administered encounter medications on file as of 01/25/2020.    Current Diagnosis/Assessment:  SDOH Interventions     Most Recent Value  SDOH Interventions  Financial Strain Interventions Intervention Not Indicated      Goals Addressed            This Visit's Progress   . Pharmacy Care Plan       CARE PLAN ENTRY (see longitudinal plan of care for additional care plan information)  Current Barriers:  . Chronic Disease Management support, education, and care coordination needs related to Hypertension, COPD, and Tobacco use   Hypertension BP Readings from Last 3 Encounters:  01/21/20 (!) 180/82  11/22/19 116/74  11/07/19 (!) 173/70 .  Pharmacist Clinical Goal(s): o Over the next 180 days, patient will work with PharmD and providers to maintain BP goal <130/80 . Current regimen:  o Amlodipine 2.5 mg BID . Interventions: o Discussed BP goals and benefits of medications for prevention of heart attack / stroke . Patient self care activities - Over the next 180 days, patient will: o Check BP daily, document, and provide at future appointments o Ensure daily salt intake < 2300 mg/day o Keep cardiology appt as scheduled  COPD . Pharmacist Clinical Goal(s) o Over the next 180 days, patient will work with PharmD and providers to optimize therapy . Current regimen:  o Anoro Ellipta 1 puff daily o Albuterol HFA as needed . Interventions: o Discussed role of inhaler and importance of adherence to control symptoms and slow progression of disease . Patient self care activities - Over the next 180 days, patient will: o Continue inhalers as directed  Smoking cessation . Pharmacist Clinical Goal(s) o Over the next 180 days, patient will work with PharmD and providers to quit smoking . Current regimen:  o Bupropion  SR 150 mg twice a day . Interventions: o Discussed benefits of bupropion to help quit smoking . Patient self care activities - Over the next 180 days, patient will: o Continue medication as directed o Reduce number of cigarettes per day  Medication management . Pharmacist Clinical Goal(s): o Over the next 180 days, patient will work with PharmD and providers to achieve optimal medication adherence . Current pharmacy: [facility]  . Interventions o Comprehensive medication review performed. o Utilize [facility] pharmacy for medication synchronization, packaging and delivery . Patient self care activities - Over the next 180 days, patient will: o Focus on medication adherence by pill box o Take medications as prescribed o Report any questions or concerns to PharmD and/or provider(s)  Initial goal documentation      Hypertension   BP goal is:  <130/80  Office blood pressures are  BP Readings from Last 3 Encounters:  01/21/20 (!) 180/82  11/22/19 116/74  11/07/19 (!) 173/70   Patient checks BP at home daily Patient home BP readings are  ranging: 135/82 today, HR 64  Patient has failed these meds in the past: n/a Patient is currently controlled on the following medications:  . Amlodipine 2.5 mg BID  We discussed diet and exercise extensively; BP goals; importance of controlling BP  Plan  Continue current medications and control with diet and exercise    COPD   Last spirometry score: FEV1/FVC 0.59, FEV1 57% predicted  Gold Grade: Gold 2 (FEV1 50-79%) Current COPD Classification:  A (low sx, <2 exacerbations/yr)  Tobacco Status:  Social History   Tobacco Use  Smoking Status Current Every Day Smoker  . Packs/day: 0.25  . Years: 30.00  . Pack years: 7.50  . Types: Cigarettes  Smokeless Tobacco Never Used  Tobacco Comment   Declined info   Patient has failed these meds in past: n/a  Patient is currently controlled on the following medications:   Anoro 1 puff  daily,   albuterol HFA prn  Using maintenance inhaler regularly? Yes Frequency of rescue inhaler use:  multiple times per day -cleans houses, uses albuterol in AM and PM   We discussed:  proper inhaler technique  Plan  Continue current medications   Tobacco Abuse   Tobacco Status:  Social History   Tobacco Use  Smoking Status Current Every Day Smoker  . Packs/day: 0.25  . Years: 30.00  . Pack years: 7.50  . Types: Cigarettes  Smokeless Tobacco Never Used  Tobacco Comment   Declined info   Previous quit attempts included: nicotine patch (didn't help much), nicotine gum ("tore up my throat"), Chantix (upset stomach) Patient is currently controlled on the following medications:  . Bupropion SR 150 mg BID  We discussed: role of bupropion in smoking cessation; pt reports she has cut back on cigarettes since starting med; she is not ready to quit entirely today  Plan  Continue current medications  Lupus   Patient has failed these meds in past: n/a Patient is currently controlled on the following medications:   methotrexate 10 mg (2.5 mg x 4) once a week,   folic acid 1 mg daily,   hydroxychloroquine 200 mg BID  We discussed:  Since starting MTX she has not had a lupus outbreak. Endorses compliance with daily folic acid and weekly MTX.  Plan  Continue current medications  IBS   Patient has failed these meds in past: dicyclomine Patient is currently controlled on the following medications:   Florastor,    Benefiber prn  Dicyclomine 10 mg QID prn  We discussed: Patient is satisfied with current regimen and denies issues  Plan  Continue current medications    Medication Management   Pt uses [facility] for all medications Uses pill box - weekly Pt endorses 100% compliance  We discussed: Reviewed patient's [facility] medication and Epic medication profile assuring there are no discrepancies or gaps in therapy. Confirmed all fill dates  appropriate and verified with patient that there is a sufficient quantity of all prescribed medications at home. Informed patient to call me any time if needing medications before scheduled deliveries.   Plan  Utilize [facility] pharmacy for medication synchronization, packaging and delivery     Follow up: 6 month phone visit  Charlene Brooke, PharmD, [facility] Clinical Pharmacist [facility] [phone]

## 2020-01-31 NOTE — Telephone Encounter (Signed)
Called pt no answer LMOM RTC.../lmb 

## 2020-01-31 NOTE — Telephone Encounter (Signed)
Patient called and stated her BP is still not under control. She feels it is running high around 170/82.   Requesting a call back from the [place] [facility].

## 2020-02-01 ENCOUNTER — Telehealth: Payer: Self-pay | Admitting: Internal Medicine

## 2020-02-01 NOTE — Telephone Encounter (Signed)
Patient called and said that she was experiencing some high BP. 177/79, 172/89 and in the evenings it goes to the 180's. She states that she is shaking really bad. She was transferred to [facility] at [facility].

## 2020-02-01 NOTE — Telephone Encounter (Signed)
Spoke to Triage nurse in regard. Pt wanted to be seen in office tomorrow for symptoms of high Bps, unsteady gait, and dizziness. She declined ED as advised by triage. I tried to contact pt to schedule an OV but had to LVM.

## 2020-02-01 NOTE — Telephone Encounter (Signed)
Pt call back BP is still elevated she has made appt to see Dr.Burns on tomorrow 02/02/20.Marland KitchenJohny Chess

## 2020-02-01 NOTE — Telephone Encounter (Signed)
Called pt again still no answer LMOM RTC.../lmb 

## 2020-02-02 ENCOUNTER — Other Ambulatory Visit: Payer: Self-pay

## 2020-02-02 ENCOUNTER — Ambulatory Visit (INDEPENDENT_AMBULATORY_CARE_PROVIDER_SITE_OTHER): Payer: PPO | Admitting: Internal Medicine

## 2020-02-02 ENCOUNTER — Encounter: Payer: Self-pay | Admitting: Internal Medicine

## 2020-02-02 DIAGNOSIS — R42 Dizziness and giddiness: Secondary | ICD-10-CM | POA: Diagnosis not present

## 2020-02-02 DIAGNOSIS — I10 Essential (primary) hypertension: Secondary | ICD-10-CM

## 2020-02-02 MED ORDER — TELMISARTAN 40 MG PO TABS: 40.0000 mg | ORAL_TABLET | Freq: Every day | ORAL | 5 refills | Status: DC

## 2020-02-02 NOTE — Progress Notes (Signed)
Subjective:    Patient ID: Sheila Morales, female    DOB: [birth year], 69 y.o.   MRN: [id number]  HPI The patient is here for an acute visit.   Lightheadedness - three weeks she was walking down the hall and was veering to the right.  She also tires out easily.  today she feels like she has no blaance.  She has trembling - some days it is bad and somedays it is not new.  She was seen here 10/1 and was noted to have new onset htn, bradycardia and hyponatremia.  She was started on amlodipine 2.5 mg daily.  Her BP at home is still high - HR varied from 38- 50's.  She is not symptomatic with the low HR's.  She thinks the lightheadedness is later in the day and when her BP is higher.  There is no related to eating.     Medications and allergies reviewed with patient and updated if appropriate.  Patient Active Problem List   Diagnosis Date Noted  . LLQ pain 11/23/2019  . Cyst of buttocks 10/05/2019  . Methotrexate, long term, current use 07/29/2019  . BPPV (benign paroxysmal positional vertigo) 12/16/2017  . Insomnia 02/24/2017  . COPD (chronic obstructive pulmonary disease) ([place]) 01/22/2017  . Routine general medical examination at a health care facility 02/19/2016  . Hereditary and idiopathic peripheral neuropathy 02/17/2015  . Smokers' cough ([place]) 12/07/2014  . Systemic lupus erythematosus ([place]) 10/13/2013  . Cough 01/18/2009  . Rash 05/31/2008  . Irritable bowel syndrome 03/25/2008  . RHEUMATIC FEVER, HX OF 01/17/2007    Current Outpatient Medications on File Prior to Visit  Medication Sig Dispense Refill  . albuterol (VENTOLIN HFA) 108 (90 Base) MCG/ACT inhaler Inhale 1-2 puffs into the lungs every 6 (six) hours as needed for wheezing or shortness of breath. 8 g 2  . amLODipine (NORVASC) 2.5 MG tablet Take 1 tablet (2.5 mg total) by mouth daily. 30 tablet 0  . aspirin 81 MG tablet Take 81 mg by mouth daily.    Marland Kitchen buPROPion (WELLBUTRIN SR) 150 MG 12 hr tablet Take 1 tab daily  X3 days, then increase to 1 tab twice daily. 60 tablet 2  . calcium carbonate (OS-CAL - DOSED IN MG OF ELEMENTAL CALCIUM) 1250 (500 Ca) MG tablet Take 1 tablet by mouth.    . chlorpheniramine-HYDROcodone (TUSSIONEX PENNKINETIC ER) 10-8 MG/5ML SUER Take 5 mLs by mouth every 12 (twelve) hours as needed for cough. 100 mL 0  . cholecalciferol (VITAMIN D) 1000 UNITS tablet Take 1,000 Units by mouth daily.      Marland Kitchen dicyclomine (BENTYL) 10 MG capsule Take 1 capsule (10 mg total) by mouth 4 (four) times daily -  before meals and at bedtime. 90 capsule 0  . folic acid (FOLVITE) 1 MG tablet Take 1 mg by mouth daily.    Marland Kitchen gabapentin (NEURONTIN) 300 MG capsule 2 po qam, 2 po afternoon, 3 po qhs 630 capsule 1  . HYDROcodone-acetaminophen (NORCO/VICODIN) 5-325 MG tablet Take 1 tablet by mouth every 6 (six) hours as needed for moderate pain. 30 tablet 0  . hydroxychloroquine (PLAQUENIL) 200 MG tablet Take 200 mg by mouth 2 (two) times daily.    . hydrOXYzine (ATARAX/VISTARIL) 10 MG tablet Take 1 tablet (10 mg total) by mouth 3 (three) times daily as needed. 45 tablet 0  . Magnesium-Potassium 250-100 MG TABS Take by mouth.    . methotrexate 2.5 MG tablet Take 10 mg by mouth once a  week. Thursdays    . MULTIPLE VITAMIN PO Take 1 tablet by mouth daily.      . Omega-3 1000 MG CAPS Take 1,000 mg by mouth daily.    . RESTASIS 0.05 % ophthalmic emulsion INSTILL 1 DROP INTO BOTH EYES TWICE A DAY  6  . saccharomyces boulardii (FLORASTOR) 250 MG capsule Take 250 mg by mouth 2 (two) times daily.    Marland Kitchen triamcinolone cream (KENALOG) 0.1 % Apply 1 application topically 2 (two) times daily. 30 g 0  . triamcinolone ointment (KENALOG) 0.5 % Apply 1 application topically 2 (two) times daily. 30 g 0  . umeclidinium-vilanterol (ANORO ELLIPTA) 62.5-25 MCG/INH AEPB INHALE ONE PUFF BY MOUTH ONCE DAILY 60 each 5  . vitamin C (ASCORBIC ACID) 500 MG tablet Take 500 mg by mouth daily.      . vitamin E 1000 UNIT capsule Take 1,000 Units by  mouth daily.    Marland Kitchen zolpidem (AMBIEN) 5 MG tablet TAKE ONE TABLET BY MOUTH EVERYDAY AT BEDTIME AS NEEDED 30 tablet 5   No current facility-administered medications on file prior to visit.    Past Medical History:  Diagnosis Date  . Acute respiratory failure with hypoxia ([place]) 11/05/2017  . Arthritis   . Colitis, ischemic ([place])   . External hemorrhoids   . H/O: rheumatic fever   . IBS (irritable bowel syndrome)   . Personal history colonic adenoma 03/25/2008   06/2007 right sided adenoma and 2 right hyperplastic polyps    . Restless leg syndrome    questionable  . Systemic lupus erythematosus ([place])   . Tubular adenoma of colon   . 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 CHOLECYSTECTOMY    . CHOLECYSTECTOMY, LAPAROSCOPIC    . COLONOSCOPY    . ESOPHAGOGASTRODUODENOSCOPY    . lesion, vulva excision    . TONSILLECTOMY    . VIDEO BRONCHOSCOPY Bilateral 11/06/2017   Procedure: VIDEO BRONCHOSCOPY WITHOUT FLUORO;  Surgeon: Marshell Garfinkel, MD;  Location: [facility];  Service: Cardiopulmonary;  Laterality: Bilateral;    Social History   Socioeconomic History  . Marital status: Married    Spouse name: Not on file  . Number of children: 2  . Years of education: 41  . Highest education level: Not on file  Occupational History  . Occupation: Education administrator business  . Occupation: caregiver  Tobacco Use  . Smoking status: Current Every Day Smoker    Packs/day: 0.25    Years: 30.00    Pack years: 7.50    Types: Cigarettes  . Smokeless tobacco: Never Used  . Tobacco comment: Declined info  Vaping Use  . Vaping Use: Never used  Substance and Sexual Activity  . Alcohol use: Yes    Alcohol/week: 5.0 standard drinks    Types: 5 Standard drinks or equivalent per week    Comment: 2 glasses of wine nightly, 4 beers nightly on weekend  . Drug use: No  . Sexual activity: Yes  Other Topics Concern  . Not on file  Social History  Narrative   HSG. Married '71. 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       One level home with spouse   Right handed   Caffeine - coffee 2-3 cups am   Exercise - treadmill    Education - 12th grade   Social Determinants of Health  Financial Resource Strain: Low Risk   . Difficulty of Paying Living Expenses: Not hard at all  Food Insecurity:   . Worried About Charity fundraiser in the Last Year: Not on file  . Ran Out of Food in the Last Year: Not on file  Transportation Needs:   . Lack of Transportation (Medical): Not on file  . Lack of Transportation (Non-Medical): Not on file  Physical Activity: Sufficiently Active  . Days of Exercise per Week: 6 days  . Minutes of Exercise per Session: 40 min  Stress:   . Feeling of Stress : Not on file  Social Connections:   . Frequency of Communication with Friends and Family: Not on file  . Frequency of Social Gatherings with Friends and Family: Not on file  . Attends Religious Services: Not on file  . Active Member of Clubs or Organizations: Not on file  . Attends Archivist Meetings: Not on file  . Marital Status: Not on file    Family History  Problem Relation Age of Onset  . Stroke Mother        Deceased, 60s  . Atrial fibrillation Father   . Colon cancer Father   . Other Sister        Pick's disease, deceased  . Healthy Daughter     Review of Systems  Constitutional: Negative for fever.  Respiratory: Positive for cough and shortness of breath (chronic from COPD, no change). Negative for wheezing.   Cardiovascular: Positive for leg swelling (related to lupus - chronic). Negative for chest pain and palpitations.  Neurological: Positive for light-headedness. Negative for dizziness and headaches.       Objective:   Vitals:   02/02/20 1439  BP: (!) 164/70  Pulse: (!) 56  Temp: 98.2 F (36.8 C)  SpO2: 96%   BP  Readings from Last 3 Encounters:  02/02/20 (!) 164/70  01/21/20 (!) 180/82  11/22/19 116/74   Wt Readings from Last 3 Encounters:  02/02/20 123 lb (55.8 kg)  01/21/20 124 lb 9.6 oz (56.5 kg)  11/22/19 116 lb (52.6 kg)   Body mass index is 21.45 kg/m.   Physical Exam    Constitutional: Appears well-developed and well-nourished. No distress.  Head: Normocephalic and atraumatic.  Neck: Neck supple. No tracheal deviation present. No thyromegaly present.  No cervical lymphadenopathy Cardiovascular: Normal rate, regular rhythm and normal heart sounds.  No murmur heard. No carotid bruit .  No edema Pulmonary/Chest: Effort normal and breath sounds normal. No respiratory distress. No has no wheezes. No rales.  Skin: Skin is warm and dry. Not diaphoretic.  Psychiatric: Normal mood and affect. Behavior is normal.       Assessment & Plan:    See Problem List for Assessment and Plan of chronic medical problems.    This visit occurred during the SARS-CoV-2 public health emergency.  Safety protocols were in place, including screening questions prior to the visit, additional usage of staff PPE, and extensive cleaning of exam room while observing appropriate contact time as indicated for disinfecting solutions.

## 2020-02-02 NOTE — Assessment & Plan Note (Signed)
Subacute Not controlled Start telmisartan 40 mg daily Continue amlodipine 2.5 mg BID - does not appear to have decreased HR lower, but if telmisartan working will try to discontinue BMP next week Has appt with cardiology

## 2020-02-02 NOTE — Patient Instructions (Addendum)
  Blood work was ordered.  Have it done next week at the [facility] lab.  You do not need to fast.     Medications reviewed and updated.  Changes include :   Start telmisartan 40 mg daily.    Your prescription(s) have been submitted to your pharmacy. Please take as directed and contact our office if you believe you are having problem(s) with the medication(s).

## 2020-02-02 NOTE — Assessment & Plan Note (Signed)
Subacute Seems to occur when BP is on higher side at home ? Related to bradycardia Has appt with cardiology Start telmisartan to improve BP BMP next week She will update via mychart with the BP and HR

## 2020-02-03 ENCOUNTER — Telehealth: Payer: Self-pay | Admitting: Pharmacist

## 2020-02-03 NOTE — Progress Notes (Signed)
Chronic Care Management Pharmacy Assistant   Name: Sheila Morales  MRN: [id number] DOB: 07-Mar-1951  Reason for Encounter: Medication Review   PCP : Hoyt Koch, MD  Allergies:  No Known Allergies  Medications: Outpatient Encounter Medications as of 02/03/2020  Medication Sig  . albuterol (VENTOLIN HFA) 108 (90 Base) MCG/ACT inhaler Inhale 1-2 puffs into the lungs every 6 (six) hours as needed for wheezing or shortness of breath.  Marland Kitchen amLODipine (NORVASC) 2.5 MG tablet Take 1 tablet (2.5 mg total) by mouth daily.  Marland Kitchen aspirin 81 MG tablet Take 81 mg by mouth daily.  Marland Kitchen buPROPion (WELLBUTRIN SR) 150 MG 12 hr tablet Take 1 tab daily X3 days, then increase to 1 tab twice daily.  . calcium carbonate (OS-CAL - DOSED IN MG OF ELEMENTAL CALCIUM) 1250 (500 Ca) MG tablet Take 1 tablet by mouth.  . chlorpheniramine-HYDROcodone (TUSSIONEX PENNKINETIC ER) 10-8 MG/5ML SUER Take 5 mLs by mouth every 12 (twelve) hours as needed for cough.  . cholecalciferol (VITAMIN D) 1000 UNITS tablet Take 1,000 Units by mouth daily.    Marland Kitchen dicyclomine (BENTYL) 10 MG capsule Take 1 capsule (10 mg total) by mouth 4 (four) times daily -  before meals and at bedtime.  . folic acid (FOLVITE) 1 MG tablet Take 1 mg by mouth daily.  Marland Kitchen gabapentin (NEURONTIN) 300 MG capsule 2 po qam, 2 po afternoon, 3 po qhs  . HYDROcodone-acetaminophen (NORCO/VICODIN) 5-325 MG tablet Take 1 tablet by mouth every 6 (six) hours as needed for moderate pain.  . hydroxychloroquine (PLAQUENIL) 200 MG tablet Take 200 mg by mouth 2 (two) times daily.  . hydrOXYzine (ATARAX/VISTARIL) 10 MG tablet Take 1 tablet (10 mg total) by mouth 3 (three) times daily as needed.  . Magnesium-Potassium 250-100 MG TABS Take by mouth.  . methotrexate 2.5 MG tablet Take 10 mg by mouth once a week. Thursdays  . MULTIPLE VITAMIN PO Take 1 tablet by mouth daily.    . Omega-3 1000 MG CAPS Take 1,000 mg by mouth daily.  . RESTASIS 0.05 % ophthalmic emulsion INSTILL  1 DROP INTO BOTH EYES TWICE A DAY  . saccharomyces boulardii (FLORASTOR) 250 MG capsule Take 250 mg by mouth 2 (two) times daily.  Marland Kitchen telmisartan (MICARDIS) 40 MG tablet Take 1 tablet (40 mg total) by mouth daily.  Marland Kitchen triamcinolone cream (KENALOG) 0.1 % Apply 1 application topically 2 (two) times daily.  Marland Kitchen triamcinolone ointment (KENALOG) 0.5 % Apply 1 application topically 2 (two) times daily.  Marland Kitchen umeclidinium-vilanterol (ANORO ELLIPTA) 62.5-25 MCG/INH AEPB INHALE ONE PUFF BY MOUTH ONCE DAILY  . vitamin C (ASCORBIC ACID) 500 MG tablet Take 500 mg by mouth daily.    . vitamin E 1000 UNIT capsule Take 1,000 Units by mouth daily.  Marland Kitchen zolpidem (AMBIEN) 5 MG tablet TAKE ONE TABLET BY MOUTH EVERYDAY AT BEDTIME AS NEEDED   No facility-administered encounter medications on file as of 02/03/2020.    Current Diagnosis: Patient Active Problem List   Diagnosis Date Noted  . Episodic lightheadedness 02/02/2020  . Hypertension 02/02/2020  . LLQ pain 11/23/2019  . Cyst of buttocks 10/05/2019  . Methotrexate, long term, current use 07/29/2019  . BPPV (benign paroxysmal positional vertigo) 12/16/2017  . Insomnia 02/24/2017  . COPD (chronic obstructive pulmonary disease) ([place]) 01/22/2017  . Routine general medical examination at a health care facility 02/19/2016  . Hereditary and idiopathic peripheral neuropathy 02/17/2015  . Smokers' cough ([place]) 12/07/2014  . Systemic lupus erythematosus ([place]) 10/13/2013  .  Cough 01/18/2009  . Rash 05/31/2008  . Irritable bowel syndrome 03/25/2008  . RHEUMATIC FEVER, HX OF 01/17/2007    Goals Addressed   None     Follow-Up:  Pharmacist Review    The pharmacy received a new Rx order for Telmisartan called the patient to inform her that she will be getting that medication delivered to her on 02/03/2020 with the rest of her medications.   Rosendo Gros, [facility]  Practice Team Manager/ CPA (Clinical Pharmacist Assistant) [phone]

## 2020-02-07 ENCOUNTER — Encounter: Payer: Self-pay | Admitting: Internal Medicine

## 2020-02-07 DIAGNOSIS — H903 Sensorineural hearing loss, bilateral: Secondary | ICD-10-CM | POA: Diagnosis not present

## 2020-02-08 MED ORDER — TELMISARTAN 40 MG PO TABS: 80.0000 mg | ORAL_TABLET | Freq: Every day | ORAL | 5 refills | Status: DC

## 2020-02-09 ENCOUNTER — Telehealth: Payer: Self-pay | Admitting: Pharmacist

## 2020-02-09 NOTE — Progress Notes (Signed)
Chronic Care Management Pharmacy Assistant   Name: Sheila Morales  MRN: [id number] DOB: January 12, 1951  Reason for Encounter: Medication Review    PCP : Hoyt Koch, MD  Allergies:  No Known Allergies  Medications: Outpatient Encounter Medications as of 02/09/2020  Medication Sig  . albuterol (VENTOLIN HFA) 108 (90 Base) MCG/ACT inhaler Inhale 1-2 puffs into the lungs every 6 (six) hours as needed for wheezing or shortness of breath.  Marland Kitchen aspirin 81 MG tablet Take 81 mg by mouth daily.  Marland Kitchen buPROPion (WELLBUTRIN SR) 150 MG 12 hr tablet Take 1 tab daily X3 days, then increase to 1 tab twice daily.  . calcium carbonate (OS-CAL - DOSED IN MG OF ELEMENTAL CALCIUM) 1250 (500 Ca) MG tablet Take 1 tablet by mouth.  . chlorpheniramine-HYDROcodone (TUSSIONEX PENNKINETIC ER) 10-8 MG/5ML SUER Take 5 mLs by mouth every 12 (twelve) hours as needed for cough.  . cholecalciferol (VITAMIN D) 1000 UNITS tablet Take 1,000 Units by mouth daily.    Marland Kitchen dicyclomine (BENTYL) 10 MG capsule Take 1 capsule (10 mg total) by mouth 4 (four) times daily -  before meals and at bedtime.  . folic acid (FOLVITE) 1 MG tablet Take 1 mg by mouth daily.  Marland Kitchen gabapentin (NEURONTIN) 300 MG capsule 2 po qam, 2 po afternoon, 3 po qhs  . HYDROcodone-acetaminophen (NORCO/VICODIN) 5-325 MG tablet Take 1 tablet by mouth every 6 (six) hours as needed for moderate pain.  . hydroxychloroquine (PLAQUENIL) 200 MG tablet Take 200 mg by mouth 2 (two) times daily.  . hydrOXYzine (ATARAX/VISTARIL) 10 MG tablet Take 1 tablet (10 mg total) by mouth 3 (three) times daily as needed.  . Magnesium-Potassium 250-100 MG TABS Take by mouth.  . methotrexate 2.5 MG tablet Take 10 mg by mouth once a week. Thursdays  . MULTIPLE VITAMIN PO Take 1 tablet by mouth daily.    . Omega-3 1000 MG CAPS Take 1,000 mg by mouth daily.  . RESTASIS 0.05 % ophthalmic emulsion INSTILL 1 DROP INTO BOTH EYES TWICE A DAY  . saccharomyces boulardii (FLORASTOR) 250 MG  capsule Take 250 mg by mouth 2 (two) times daily.  Marland Kitchen telmisartan (MICARDIS) 40 MG tablet Take 2 tablets (80 mg total) by mouth daily.  Marland Kitchen triamcinolone cream (KENALOG) 0.1 % Apply 1 application topically 2 (two) times daily.  Marland Kitchen triamcinolone ointment (KENALOG) 0.5 % Apply 1 application topically 2 (two) times daily.  Marland Kitchen umeclidinium-vilanterol (ANORO ELLIPTA) 62.5-25 MCG/INH AEPB INHALE ONE PUFF BY MOUTH ONCE DAILY  . vitamin C (ASCORBIC ACID) 500 MG tablet Take 500 mg by mouth daily.    . vitamin E 1000 UNIT capsule Take 1,000 Units by mouth daily.  Marland Kitchen zolpidem (AMBIEN) 5 MG tablet TAKE ONE TABLET BY MOUTH EVERYDAY AT BEDTIME AS NEEDED   No facility-administered encounter medications on file as of 02/09/2020.    Current Diagnosis: Patient Active Problem List   Diagnosis Date Noted  . Episodic lightheadedness 02/02/2020  . Hypertension 02/02/2020  . LLQ pain 11/23/2019  . Cyst of buttocks 10/05/2019  . Methotrexate, long term, current use 07/29/2019  . BPPV (benign paroxysmal positional vertigo) 12/16/2017  . Insomnia 02/24/2017  . COPD (chronic obstructive pulmonary disease) ([place]) 01/22/2017  . Routine general medical examination at a health care facility 02/19/2016  . Hereditary and idiopathic peripheral neuropathy 02/17/2015  . Smokers' cough ([place]) 12/07/2014  . Systemic lupus erythematosus ([place]) 10/13/2013  . Cough 01/18/2009  . Rash 05/31/2008  . Irritable bowel syndrome 03/25/2008  .  RHEUMATIC FEVER, HX OF 01/17/2007    Goals Addressed   None     Follow-Up:  Pharmacist Review    Received call from patient regarding medication management via [facility].  Patient requested an acute fill for Anoro to be delivered: 02/11/2020 Pharmacy needs refills? No  Confirmed delivery date of 02/11/2020, advised patient that pharmacy will contact them the morning of delivery.  Rosendo Gros, [facility]  Practice Team Manager/ CPA (Clinical Pharmacist Assistant) [phone]

## 2020-02-14 ENCOUNTER — Ambulatory Visit: Payer: PPO | Admitting: Internal Medicine

## 2020-02-14 ENCOUNTER — Encounter: Payer: Self-pay | Admitting: *Deleted

## 2020-02-14 ENCOUNTER — Other Ambulatory Visit: Payer: Self-pay

## 2020-02-14 ENCOUNTER — Encounter: Payer: Self-pay | Admitting: Internal Medicine

## 2020-02-14 VITALS — BP 160/86 | HR 50 | Ht 62.0 in | Wt 124.8 lb

## 2020-02-14 DIAGNOSIS — R42 Dizziness and giddiness: Secondary | ICD-10-CM | POA: Diagnosis not present

## 2020-02-14 DIAGNOSIS — M531 Cervicobrachial syndrome: Secondary | ICD-10-CM | POA: Diagnosis not present

## 2020-02-14 DIAGNOSIS — M9903 Segmental and somatic dysfunction of lumbar region: Secondary | ICD-10-CM | POA: Diagnosis not present

## 2020-02-14 DIAGNOSIS — I10 Essential (primary) hypertension: Secondary | ICD-10-CM | POA: Diagnosis not present

## 2020-02-14 DIAGNOSIS — I495 Sick sinus syndrome: Secondary | ICD-10-CM

## 2020-02-14 DIAGNOSIS — R001 Bradycardia, unspecified: Secondary | ICD-10-CM

## 2020-02-14 DIAGNOSIS — M9901 Segmental and somatic dysfunction of cervical region: Secondary | ICD-10-CM | POA: Diagnosis not present

## 2020-02-14 DIAGNOSIS — M5414 Radiculopathy, thoracic region: Secondary | ICD-10-CM | POA: Diagnosis not present

## 2020-02-14 DIAGNOSIS — M5417 Radiculopathy, lumbosacral region: Secondary | ICD-10-CM | POA: Diagnosis not present

## 2020-02-14 DIAGNOSIS — M9902 Segmental and somatic dysfunction of thoracic region: Secondary | ICD-10-CM | POA: Diagnosis not present

## 2020-02-14 MED ORDER — AMLODIPINE BESYLATE 5 MG PO TABS
5.0000 mg | ORAL_TABLET | Freq: Every day | ORAL | 3 refills | Status: DC
Start: 2020-02-14 — End: 2020-02-29

## 2020-02-14 NOTE — Patient Instructions (Signed)
Medication Instructions:  PLEASE TAKE ONE OF YOUR BP MEDICATIONS AT NIGHT  START AMLODIPINE 5mg   (1 tablet) DAILY  *If you need a refill on your cardiac medications before your next appointment, please call your pharmacy*  Lab Work: None Ordered At This Time.  If you have labs (blood work) drawn today and your tests are completely normal, you will receive your results only by: Marland Kitchen MyChart Message (if you have MyChart) OR . A paper copy in the mail If you have any lab test that is abnormal or we need to change your treatment, we will call you to review the results.  Testing/Procedures: Your physician has requested that you have an echocardiogram. Echocardiography is a painless test that uses sound waves to create images of your heart. It provides your doctor with information about the size and shape of your heart and how well your heart's chambers and valves are working. You may receive an ultrasound enhancing agent through an IV if needed to better visualize your heart during the echo.This procedure takes approximately one hour. There are no restrictions for this procedure. This will take place at the [address].   [employer] Cardiac Event Monitor Instructions Your physician has requested you wear your cardiac event monitor for 30 days, (1-30). [employer] may call or text to confirm a shipping address. The monitor will be sent to a land address via UPS. [employer] will not ship a monitor to a PO BOX. It typically takes 3-5 days to receive your monitor after it has been enrolled. [employer] will assist with USPS tracking if your package is delayed. The telephone number for [employer] is [phone]. Once you have received your monitor, please review the enclosed instructions. Instruction tutorials can also be viewed under help and settings on the enclosed cell phone. Your monitor has already been registered assigning a specific monitor serial # to you.  Applying the  monitor Remove cell phone from case and turn it on. The cell phone works as Dealer and needs to be within [employer] of you at all times. The cell phone will need to be charged on a daily basis. We recommend you plug the cell phone into the enclosed charger at your bedside table every night.  Monitor batteries: You will receive two monitor batteries labelled #1 and #2. These are your recorders. Plug battery #2 onto the second connection on the enclosed charger. Keep one battery on the charger at all times. This will keep the monitor battery deactivated. It will also keep it fully charged for when you need to switch your monitor batteries. A small light will be blinking on the battery emblem when it is charging. The light on the battery emblem will remain on when the battery is fully charged.  Open package of a Monitor strip. Insert battery #1 into black hood on strip and gently squeeze monitor battery onto connection as indicated in instruction booklet. Set aside while preparing skin.  Choose location for your strip, vertical or horizontal, as indicated in the instruction booklet. Shave to remove all hair from location. There cannot be any lotions, oils, powders, or colognes on skin where monitor is to be applied. Wipe skin clean with enclosed Saline wipe. Dry skin completely.  Peel paper labeled #1 off the back of the Monitor strip exposing the adhesive. Place the monitor on the chest in the vertical or horizontal position shown in the instruction booklet. One arrow on the monitor strip must be pointing upward. Carefully  remove paper labeled #2, attaching remainder of strip to your skin. Try not to create any folds or wrinkles in the strip as you apply it.  Firmly press and release the circle in the center of the monitor battery. You will hear a small beep. This is turning the monitor battery on. The heart emblem on the monitor battery will light up every 5 seconds if the monitor  battery in turned on and connected to the patient securely. Do not push and hold the circle down as this turns the monitor battery off. The cell phone will locate the monitor battery. A screen will appear on the cell phone checking the connection of your monitor strip. This may read poor connection initially but change to good connection within the next minute. Once your monitor accepts the connection you will hear a series of 3 beeps followed by a climbing crescendo of beeps. A screen will appear on the cell phone showing the two monitor strip placement options. Touch the picture that demonstrates where you applied the monitor strip.  Your monitor strip and battery are waterproof. You are able to shower, bathe, or swim with the monitor on. They just ask you do not submerge deeper than 3 feet underwater. We recommend removing the monitor if you are swimming in a lake, river, or ocean.  Your monitor battery will need to be switched to a fully charged monitor battery approximately once a week. The cell phone will alert you of an action which needs to be made.  On the cell phone, tap for details to reveal connection status, monitor battery status, and cell phone battery status. The green dots indicates your monitor is in good status. A red dot indicates there is something that needs your attention.  To record a symptom, click the circle on the monitor battery. In 30-60 seconds a list of symptoms will appear on the cell phone. Select your symptom and tap save. Your monitor will record a sustained or significant arrhythmia regardless of you clicking the button. Some patients do not feel the heart rhythm irregularities. [employer] will notify us of any serious or critical events.  Refer to instruction booklet for instructions on switching batteries, changing strips, the Do not disturb or Pause features, or any additional questions.  Call [employer] at [phone], to confirm your monitor is  transmitting and record your baseline. They will answer any questions you may have regarding the monitor instructions at that time.  Returning the monitor to [place] all equipment back into blue box. Peel off strip of paper to expose adhesive and close box securely. There is a prepaid UPS shipping label on this box. Drop in a UPS drop box, or at a UPS facility like [employer]. You may also contact [employer] to arrange UPS to pick up monitor package at your home.   Follow-Up: At [facility], you and your health needs are our priority.  As part of our continuing mission to provide you with exceptional heart care, we have created designated Provider Care Teams.  These Care Teams include your primary Cardiologist (physician) and Advanced Practice Providers (APPs -  Physician Assistants and Nurse Practitioners) who all work together to provide you with the care you need, when you need it.  Your next appointment:   2 month(s)  The format for your next appointment:   In Person  Provider:   Cherlynn Kaiser, MD

## 2020-02-14 NOTE — Progress Notes (Signed)
Cardiology Office Note:    Date:  02/14/2020   ID:  Sheila Morales, DOB 07-Jan-1951, MRN [id number]  PCP:  Hoyt Koch, MD  Cardiologist:  No primary care provider on file.  Electrophysiologist:  None   Referring MD: Marrian Salvage,*   Chief Complaint/Reason for Referral: Bradycardia and abnormal ECG  History of Present Illness:    Sheila Morales is a 69 y.o. female with a history of COPD, SLE on methorexate, BPPV, and HTN who presents for dizziness and bradycardia.   Recently saw her PCP for lightheadedness, notes she was walking down a hallway about 1 mo ago and was veering to the right. She described fatigue and issues with balance. Recently started on amlodipine for HTN, 2.5 mg daily. She also takes telmisartan 80 mg daily for HTN.   Regarding bradycardia, she notes syncope 3-4 years ago after working in yard and bent over to get water. She notes a dizzy, shaky feeling daily, with report of bradycardia about 1.5 mo ago. Denies chest pain, chest pressure, palpitations, PND, orthopnea, or leg swelling. Denies fever, chills. Denies nausea, vomiting.    COPD - she quit smoking Oct 1. Treated with Anoro inhaler and albuterol.   Past Medical History:  Diagnosis Date  . Acute respiratory failure with hypoxia ([place]) 11/05/2017  . Arthritis   . Colitis, ischemic ([place])   . External hemorrhoids   . H/O: rheumatic fever   . IBS (irritable bowel syndrome)   . Personal history colonic adenoma 03/25/2008   06/2007 right sided adenoma and 2 right hyperplastic polyps    . Restless leg syndrome    questionable  . Systemic lupus erythematosus ([place])   . Tubular adenoma of colon   . 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 CHOLECYSTECTOMY    . CHOLECYSTECTOMY, LAPAROSCOPIC    . COLONOSCOPY    . ESOPHAGOGASTRODUODENOSCOPY    . lesion, vulva excision    . TONSILLECTOMY    . VIDEO BRONCHOSCOPY Bilateral 11/06/2017    Procedure: VIDEO BRONCHOSCOPY WITHOUT FLUORO;  Surgeon: Marshell Garfinkel, MD;  Location: [facility];  Service: Cardiopulmonary;  Laterality: Bilateral;    Current Medications: No outpatient medications have been marked as taking for the 02/14/20 encounter (Appointment) with Elouise Munroe, MD.     Allergies:   Patient has no known allergies.   Social History   Tobacco Use  . Smoking status: Current Every Day Smoker    Packs/day: 0.25    Years: 30.00    Pack years: 7.50    Types: Cigarettes  . Smokeless tobacco: Never Used  . Tobacco comment: Declined info  Vaping Use  . Vaping Use: Never used  Substance Use Topics  . Alcohol use: Yes    Alcohol/week: 5.0 standard drinks    Types: 5 Standard drinks or equivalent per week    Comment: 2 glasses of wine nightly, 4 beers nightly on weekend  . Drug use: No     Family History: The patient's family history includes Atrial fibrillation in her father; Colon cancer in her father; Healthy in her daughter; Other in her sister; Stroke in her mother.  ROS:   Please see the history of present illness.    All other systems reviewed and are negative.  EKGs/Labs/Other Studies Reviewed:    The following studies were reviewed today:  EKG:  Sinus bradycardia rate 50 bpm  I have independently reviewed the images from  CT Chest wo 11/04/17. 3 vessel CAC noted with aortic atherosclerosis and mitral annular calcification.  Recent Labs: 03/29/2019: TSH 1.45 01/21/2020: ALT 29; BUN 13; Creatinine, Ser 0.89; Hemoglobin 13.1; Platelets 206.0; Potassium 4.3; Sodium 128  Recent Lipid Panel    Component Value Date/Time   CHOL 148 03/29/2019 0909   TRIG 66.0 03/29/2019 0909   HDL 63.60 03/29/2019 0909   CHOLHDL 2 03/29/2019 0909   VLDL 13.2 03/29/2019 0909   LDLCALC 71 03/29/2019 0909   LDLDIRECT 52.0 02/24/2017 1351    Physical Exam:    VS:  BP (!) 160/86   Pulse (!) 50   Ht 5\' 2"  (1.575 m)   Wt 124 lb 12.8 oz (56.6 kg)   BMI 22.83  kg/m     Wt Readings from Last 5 Encounters:  02/02/20 123 lb (55.8 kg)  01/21/20 124 lb 9.6 oz (56.5 kg)  11/22/19 116 lb (52.6 kg)  11/05/19 121 lb (54.9 kg)  10/05/19 121 lb (54.9 kg)    Constitutional: No acute distress Eyes: sclera non-icteric, normal conjunctiva and lids ENMT: normal dentition, moist mucous membranes Cardiovascular: regular rhythm, bradycardic rate, no murmurs. S1 and S2 normal. Radial pulses normal bilaterally. No jugular venous distention.  Respiratory: clear to auscultation bilaterally GI : normal bowel sounds, soft and nontender. No distention.   MSK: extremities warm, well perfused. No edema.  NEURO: grossly nonfocal exam, moves all extremities. PSYCH: alert and oriented x 3, normal mood and affect.   ASSESSMENT:    1. Dizziness   2. Bradycardia   3. Primary hypertension    PLAN:    Dizziness - Plan: EKG 12-Lead, ECHOCARDIOGRAM COMPLETE, CARDIAC EVENT MONITOR Bradycardia - Plan: EKG 12-Lead, ECHOCARDIOGRAM COMPLETE, CARDIAC EVENT MONITOR - Evaluate further with echo and 30 day monitor. I'm concerned about episode of syncope several years ago and balance issues in setting of dizziness and fatigue, will need to assess for chronotropic incompetence as well as AV block.   Primary hypertension Increase amlodipine to 5 mg.   Total time of encounter: 45 minutes total time of encounter, including 25 minutes spent in face-to-face patient care on the date of this encounter. This time includes coordination of care and counseling regarding above mentioned problem list. Remainder of non-face-to-face time involved reviewing chart documents/testing relevant to the patient encounter and documentation in the medical record. I have independently reviewed documentation from referring provider.   Cherlynn Kaiser, MD [place]  [facility]    Medication Adjustments/Labs and Tests Ordered: Current medicines are reviewed at length with the patient today.   Concerns regarding medicines are outlined above.   Orders Placed This Encounter  Procedures  . CARDIAC EVENT MONITOR  . EKG 12-Lead  . ECHOCARDIOGRAM COMPLETE    Meds ordered this encounter  Medications  . DISCONTD: amLODipine (NORVASC) 5 MG tablet    Sig: Take 1 tablet (5 mg total) by mouth daily.    Dispense:  30 tablet    Refill:  3    Patient Instructions  Medication Instructions:  PLEASE TAKE ONE OF YOUR BP MEDICATIONS AT NIGHT  START AMLODIPINE 5mg   (1 tablet) DAILY  *If you need a refill on your cardiac medications before your next appointment, please call your pharmacy*  Lab Work: None Ordered At This Time.  If you have labs (blood work) drawn today and your tests are completely normal, you will receive your results only by: Marland Kitchen MyChart Message (if you have MyChart) OR . A paper copy in the mail  If you have any lab test that is abnormal or we need to change your treatment, we will call you to review the results.  Testing/Procedures: Your physician has requested that you have an echocardiogram. Echocardiography is a painless test that uses sound waves to create images of your heart. It provides your doctor with information about the size and shape of your heart and how well your heart's chambers and valves are working. You may receive an ultrasound enhancing agent through an IV if needed to better visualize your heart during the echo.This procedure takes approximately one hour. There are no restrictions for this procedure. This will take place at the [address].   [employer] Cardiac Event Monitor Instructions Your physician has requested you wear your cardiac event monitor for 30 days, (1-30). [employer] may call or text to confirm a shipping address. The monitor will be sent to a land address via UPS. [employer] will not ship a monitor to a PO BOX. It typically takes 3-5 days to receive your monitor after it has been enrolled. [employer] will assist with  USPS tracking if your package is delayed. The telephone number for [employer] is [phone]. Once you have received your monitor, please review the enclosed instructions. Instruction tutorials can also be viewed under help and settings on the enclosed cell phone. Your monitor has already been registered assigning a specific monitor serial # to you.  Applying the monitor Remove cell phone from case and turn it on. The cell phone works as Dealer and needs to be within [employer] of you at all times. The cell phone will need to be charged on a daily basis. We recommend you plug the cell phone into the enclosed charger at your bedside table every night.  Monitor batteries: You will receive two monitor batteries labelled #1 and #2. These are your recorders. Plug battery #2 onto the second connection on the enclosed charger. Keep one battery on the charger at all times. This will keep the monitor battery deactivated. It will also keep it fully charged for when you need to switch your monitor batteries. A small light will be blinking on the battery emblem when it is charging. The light on the battery emblem will remain on when the battery is fully charged.  Open package of a Monitor strip. Insert battery #1 into black hood on strip and gently squeeze monitor battery onto connection as indicated in instruction booklet. Set aside while preparing skin.  Choose location for your strip, vertical or horizontal, as indicated in the instruction booklet. Shave to remove all hair from location. There cannot be any lotions, oils, powders, or colognes on skin where monitor is to be applied. Wipe skin clean with enclosed Saline wipe. Dry skin completely.  Peel paper labeled #1 off the back of the Monitor strip exposing the adhesive. Place the monitor on the chest in the vertical or horizontal position shown in the instruction booklet. One arrow on the monitor strip must be pointing upward. Carefully  remove paper labeled #2, attaching remainder of strip to your skin. Try not to create any folds or wrinkles in the strip as you apply it.  Firmly press and release the circle in the center of the monitor battery. You will hear a small beep. This is turning the monitor battery on. The heart emblem on the monitor battery will light up every 5 seconds if the monitor battery in turned on and connected to the patient securely. Do not push  and hold the circle down as this turns the monitor battery off. The cell phone will locate the monitor battery. A screen will appear on the cell phone checking the connection of your monitor strip. This may read poor connection initially but change to good connection within the next minute. Once your monitor accepts the connection you will hear a series of 3 beeps followed by a climbing crescendo of beeps. A screen will appear on the cell phone showing the two monitor strip placement options. Touch the picture that demonstrates where you applied the monitor strip.  Your monitor strip and battery are waterproof. You are able to shower, bathe, or swim with the monitor on. They just ask you do not submerge deeper than 3 feet underwater. We recommend removing the monitor if you are swimming in a lake, river, or ocean.  Your monitor battery will need to be switched to a fully charged monitor battery approximately once a week. The cell phone will alert you of an action which needs to be made.  On the cell phone, tap for details to reveal connection status, monitor battery status, and cell phone battery status. The green dots indicates your monitor is in good status. A red dot indicates there is something that needs your attention.  To record a symptom, click the circle on the monitor battery. In 30-60 seconds a list of symptoms will appear on the cell phone. Select your symptom and tap save. Your monitor will record a sustained or significant arrhythmia regardless of  you clicking the button. Some patients do not feel the heart rhythm irregularities. [employer] will notify us of any serious or critical events.  Refer to instruction booklet for instructions on switching batteries, changing strips, the Do not disturb or Pause features, or any additional questions.  Call [employer] at [phone], to confirm your monitor is transmitting and record your baseline. They will answer any questions you may have regarding the monitor instructions at that time.  Returning the monitor to [employer] all equipment back into blue box. Peel off strip of paper to expose adhesive and close box securely. There is a prepaid UPS shipping label on this box. Drop in a UPS drop box, or at a UPS facility like [employer]. You may also contact [employer] to arrange UPS to pick up monitor package at your home.   Follow-Up: At [facility], you and your health needs are our priority.  As part of our continuing mission to provide you with exceptional heart care, we have created designated Provider Care Teams.  These Care Teams include your primary Cardiologist (physician) and Advanced Practice Providers (APPs -  Physician Assistants and Nurse Practitioners) who all work together to provide you with the care you need, when you need it.  Your next appointment:   2 month(s)  The format for your next appointment:   In Person  Provider:   Cherlynn Kaiser, MD

## 2020-02-14 NOTE — Progress Notes (Signed)
Patient ID: Sheila Morales, female   DOB: 1950/10/24, 69 y.o.   MRN: [id number] Patient enrolled for [employer] to ship a 30 day cardiac event monitor to her home.

## 2020-02-15 ENCOUNTER — Telehealth: Payer: Self-pay | Admitting: Pharmacist

## 2020-02-15 NOTE — Progress Notes (Signed)
Chronic Care Management Pharmacy Assistant   Name: Sheila Morales  MRN: [id number] DOB: 1950-11-28  Reason for Encounter: Medication Review  Patient Questions:  1.  Have you seen any other providers since your last visit? No  2.  Any changes in your medicines or health? No   PCP : Hoyt Koch, MD  Allergies:  No Known Allergies  Medications: Outpatient Encounter Medications as of 02/15/2020  Medication Sig  . albuterol (VENTOLIN HFA) 108 (90 Base) MCG/ACT inhaler Inhale 1-2 puffs into the lungs every 6 (six) hours as needed for wheezing or shortness of breath.  Marland Kitchen amLODipine (NORVASC) 5 MG tablet Take 1 tablet (5 mg total) by mouth daily.  Marland Kitchen aspirin 81 MG tablet Take 81 mg by mouth daily.  Marland Kitchen buPROPion (WELLBUTRIN SR) 150 MG 12 hr tablet Take 1 tab daily X3 days, then increase to 1 tab twice daily.  . calcium carbonate (OS-CAL - DOSED IN MG OF ELEMENTAL CALCIUM) 1250 (500 Ca) MG tablet Take 1 tablet by mouth.  . chlorpheniramine-HYDROcodone (TUSSIONEX PENNKINETIC ER) 10-8 MG/5ML SUER Take 5 mLs by mouth every 12 (twelve) hours as needed for cough.  . cholecalciferol (VITAMIN D) 1000 UNITS tablet Take 1,000 Units by mouth daily.    Marland Kitchen dicyclomine (BENTYL) 10 MG capsule Take 1 capsule (10 mg total) by mouth 4 (four) times daily -  before meals and at bedtime.  . folic acid (FOLVITE) 1 MG tablet Take 1 mg by mouth daily.  Marland Kitchen gabapentin (NEURONTIN) 300 MG capsule 2 po qam, 2 po afternoon, 3 po qhs  . hydroxychloroquine (PLAQUENIL) 200 MG tablet Take 200 mg by mouth 2 (two) times daily.  . hydrOXYzine (ATARAX/VISTARIL) 10 MG tablet Take 1 tablet (10 mg total) by mouth 3 (three) times daily as needed.  . Magnesium-Potassium 250-100 MG TABS Take by mouth.  . methotrexate 2.5 MG tablet Take 10 mg by mouth once a week. Thursdays  . MULTIPLE VITAMIN PO Take 1 tablet by mouth daily.    . Omega-3 1000 MG CAPS Take 1,000 mg by mouth daily.  . RESTASIS 0.05 % ophthalmic emulsion INSTILL 1  DROP INTO BOTH EYES TWICE A DAY  . saccharomyces boulardii (FLORASTOR) 250 MG capsule Take 250 mg by mouth 2 (two) times daily.  Marland Kitchen telmisartan (MICARDIS) 40 MG tablet Take 2 tablets (80 mg total) by mouth daily.  Marland Kitchen triamcinolone cream (KENALOG) 0.1 % Apply 1 application topically 2 (two) times daily.  Marland Kitchen triamcinolone ointment (KENALOG) 0.5 % Apply 1 application topically 2 (two) times daily.  Marland Kitchen umeclidinium-vilanterol (ANORO ELLIPTA) 62.5-25 MCG/INH AEPB INHALE ONE PUFF BY MOUTH ONCE DAILY  . vitamin C (ASCORBIC ACID) 500 MG tablet Take 500 mg by mouth daily.    . vitamin E 1000 UNIT capsule Take 1,000 Units by mouth daily.  Marland Kitchen zolpidem (AMBIEN) 5 MG tablet TAKE ONE TABLET BY MOUTH EVERYDAY AT BEDTIME AS NEEDED   No facility-administered encounter medications on file as of 02/15/2020.    Current Diagnosis: Patient Active Problem List   Diagnosis Date Noted  . Episodic lightheadedness 02/02/2020  . Hypertension 02/02/2020  . LLQ pain 11/23/2019  . Cyst of buttocks 10/05/2019  . Methotrexate, long term, current use 07/29/2019  . BPPV (benign paroxysmal positional vertigo) 12/16/2017  . Insomnia 02/24/2017  . COPD (chronic obstructive pulmonary disease) ([place]) 01/22/2017  . Routine general medical examination at a health care facility 02/19/2016  . Hereditary and idiopathic peripheral neuropathy 02/17/2015  . Smokers' cough ([place]) 12/07/2014  .  Systemic lupus erythematosus ([place]) 10/13/2013  . Cough 01/18/2009  . Rash 05/31/2008  . Irritable bowel syndrome 03/25/2008  . RHEUMATIC FEVER, HX OF 01/17/2007    Goals Addressed   None     Follow-Up:  Coordination of Enhanced Pharmacy Services   Received call from patient regarding medication management via [facility].  Patient requested an acute fill for Telmisartan 40mg , Amlodipine 5mg  to be delivered: 02/18/2020 Pharmacy needs refills? No  Confirmed delivery date of 02/18/2020, advised patient that pharmacy will contact them  the morning of delivery.    Received call from patient regarding medication management via [facility].  Patient requested an acute fill for Zolpidem Tartrate 5mg  to be delivered: 03/02/2020 Pharmacy needs refills? No  Confirmed delivery date of 03/02/2020, advised patient that pharmacy will contact them the morning of delivery.   Rosendo Gros, [facility]  Practice Team Manager/ CPA (Clinical Pharmacist Assistant) [phone]

## 2020-02-17 NOTE — Progress Notes (Signed)
Subjective:    Patient ID: Sheila Morales, female    DOB: 04-22-1951, 69 y.o.   MRN: [id number]  HPI The patient is here for an acute visit.  Right leg pain: On Wednesday her entire right leg was achy.  She had transient sharp pain in the medial aspect of her right calf that would last 3-4 seconds.  It felt like it was between the muscle and the bone.  Since then she is only had a few episodes of this pain.  She thinks it is getting better.  She denied any swelling or bruising in the leg.  She denied any numbness or tingling.  On Wednesday the leg did feel weak and she had more difficulty walking.  She denies any unusual activity or injuries.  She saw cardiology recently and one of her blood pressure medications was increased.  Her blood pressure is still slightly high at home, but she thinks it is getting better.  Medications and allergies reviewed with patient and updated if appropriate.  Patient Active Problem List   Diagnosis Date Noted  . Pain in right lower leg 02/18/2020  . Episodic lightheadedness 02/02/2020  . Hypertension 02/02/2020  . LLQ pain 11/23/2019  . Cyst of buttocks 10/05/2019  . Methotrexate, long term, current use 07/29/2019  . BPPV (benign paroxysmal positional vertigo) 12/16/2017  . Insomnia 02/24/2017  . COPD (chronic obstructive pulmonary disease) ([place]) 01/22/2017  . Routine general medical examination at a health care facility 02/19/2016  . Hereditary and idiopathic peripheral neuropathy 02/17/2015  . Smokers' cough ([place]) 12/07/2014  . Systemic lupus erythematosus ([place]) 10/13/2013  . Cough 01/18/2009  . Rash 05/31/2008  . Irritable bowel syndrome 03/25/2008  . RHEUMATIC FEVER, HX OF 01/17/2007    Current Outpatient Medications on File Prior to Visit  Medication Sig Dispense Refill  . albuterol (VENTOLIN HFA) 108 (90 Base) MCG/ACT inhaler Inhale 1-2 puffs into the lungs every 6 (six) hours as needed for wheezing or shortness of breath. 8 g 2  .  amLODipine (NORVASC) 5 MG tablet Take 1 tablet (5 mg total) by mouth daily. 30 tablet 3  . aspirin 81 MG tablet Take 81 mg by mouth daily.    Marland Kitchen buPROPion (WELLBUTRIN SR) 150 MG 12 hr tablet Take 1 tab daily X3 days, then increase to 1 tab twice daily. 60 tablet 2  . calcium carbonate (OS-CAL - DOSED IN MG OF ELEMENTAL CALCIUM) 1250 (500 Ca) MG tablet Take 1 tablet by mouth.    . chlorpheniramine-HYDROcodone (TUSSIONEX PENNKINETIC ER) 10-8 MG/5ML SUER Take 5 mLs by mouth every 12 (twelve) hours as needed for cough. 100 mL 0  . cholecalciferol (VITAMIN D) 1000 UNITS tablet Take 1,000 Units by mouth daily.      Marland Kitchen dicyclomine (BENTYL) 10 MG capsule Take 1 capsule (10 mg total) by mouth 4 (four) times daily -  before meals and at bedtime. 90 capsule 0  . folic acid (FOLVITE) 1 MG tablet Take 1 mg by mouth daily.    Marland Kitchen gabapentin (NEURONTIN) 300 MG capsule 2 po qam, 2 po afternoon, 3 po qhs 630 capsule 1  . hydroxychloroquine (PLAQUENIL) 200 MG tablet Take 200 mg by mouth 2 (two) times daily.    . hydrOXYzine (ATARAX/VISTARIL) 10 MG tablet Take 1 tablet (10 mg total) by mouth 3 (three) times daily as needed. 45 tablet 0  . Magnesium-Potassium 250-100 MG TABS Take by mouth.    . methotrexate 2.5 MG tablet Take 10 mg by mouth  once a week. Thursdays    . MULTIPLE VITAMIN PO Take 1 tablet by mouth daily.      . Omega-3 1000 MG CAPS Take 1,000 mg by mouth daily.    . RESTASIS 0.05 % ophthalmic emulsion INSTILL 1 DROP INTO BOTH EYES TWICE A DAY  6  . saccharomyces boulardii (FLORASTOR) 250 MG capsule Take 250 mg by mouth 2 (two) times daily.    Marland Kitchen triamcinolone cream (KENALOG) 0.1 % Apply 1 application topically 2 (two) times daily. 30 g 0  . triamcinolone ointment (KENALOG) 0.5 % Apply 1 application topically 2 (two) times daily. 30 g 0  . umeclidinium-vilanterol (ANORO ELLIPTA) 62.5-25 MCG/INH AEPB INHALE ONE PUFF BY MOUTH ONCE DAILY 60 each 5  . vitamin C (ASCORBIC ACID) 500 MG tablet Take 500 mg by mouth  daily.      . vitamin E 1000 UNIT capsule Take 1,000 Units by mouth daily.    Marland Kitchen zolpidem (AMBIEN) 5 MG tablet TAKE ONE TABLET BY MOUTH EVERYDAY AT BEDTIME AS NEEDED 30 tablet 5   No current facility-administered medications on file prior to visit.    Past Medical History:  Diagnosis Date  . Acute respiratory failure with hypoxia ([place]) 11/05/2017  . Arthritis   . Colitis, ischemic ([place])   . External hemorrhoids   . H/O: rheumatic fever   . IBS (irritable bowel syndrome)   . Personal history colonic adenoma 03/25/2008   06/2007 right sided adenoma and 2 right hyperplastic polyps    . Restless leg syndrome    questionable  . Systemic lupus erythematosus ([place])   . Tubular adenoma of colon   . 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 CHOLECYSTECTOMY    . CHOLECYSTECTOMY, LAPAROSCOPIC    . COLONOSCOPY    . ESOPHAGOGASTRODUODENOSCOPY    . lesion, vulva excision    . TONSILLECTOMY    . VIDEO BRONCHOSCOPY Bilateral 11/06/2017   Procedure: VIDEO BRONCHOSCOPY WITHOUT FLUORO;  Surgeon: Marshell Garfinkel, MD;  Location: [facility];  Service: Cardiopulmonary;  Laterality: Bilateral;    Social History   Socioeconomic History  . Marital status: Married    Spouse name: Not on file  . Number of children: 2  . Years of education: 62  . Highest education level: Not on file  Occupational History  . Occupation: Education administrator business  . Occupation: caregiver  Tobacco Use  . Smoking status: Current Every Day Smoker    Packs/day: 0.25    Years: 30.00    Pack years: 7.50    Types: Cigarettes  . Smokeless tobacco: Never Used  . Tobacco comment: Declined info  Vaping Use  . Vaping Use: Never used  Substance and Sexual Activity  . Alcohol use: Yes    Alcohol/week: 5.0 standard drinks    Types: 5 Standard drinks or equivalent per week    Comment: 2 glasses of wine nightly, 4 beers nightly on weekend  . Drug use: No  . Sexual activity: Yes   Other Topics Concern  . Not on file  Social History Narrative   HSG. Married '71. 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       One level home with spouse   Right handed   Caffeine - coffee 2-3 cups am   Exercise - treadmill    Education - 12th grade   Social Determinants of Health  Financial Resource Strain: Low Risk   . Difficulty of Paying Living Expenses: Not hard at all  Food Insecurity:   . Worried About Charity fundraiser in the Last Year: Not on file  . Ran Out of Food in the Last Year: Not on file  Transportation Needs:   . Lack of Transportation (Medical): Not on file  . Lack of Transportation (Non-Medical): Not on file  Physical Activity: Sufficiently Active  . Days of Exercise per Week: 6 days  . Minutes of Exercise per Session: 40 min  Stress:   . Feeling of Stress : Not on file  Social Connections:   . Frequency of Communication with Friends and Family: Not on file  . Frequency of Social Gatherings with Friends and Family: Not on file  . Attends Religious Services: Not on file  . Active Member of Clubs or Organizations: Not on file  . Attends Archivist Meetings: Not on file  . Marital Status: Not on file    Family History  Problem Relation Age of Onset  . Stroke Mother        Deceased, 9s  . Atrial fibrillation Father   . Colon cancer Father   . Other Sister        Pick's disease, deceased  . Healthy Daughter     Review of Systems  Constitutional: Negative for fever.  Musculoskeletal: Negative for back pain.       Right leg pain  Neurological: Positive for weakness (Transient right leg). Negative for numbness.       Objective:   Vitals:   02/18/20 1458  BP: (!) 150/70  Pulse: 62  Temp: 98.3 F (36.8 C)  SpO2: 98%   BP Readings from Last 3 Encounters:  02/18/20 (!) 150/70  02/14/20 (!) 160/86  02/02/20 (!) 164/70   Wt Readings from  Last 3 Encounters:  02/18/20 124 lb (56.2 kg)  02/14/20 124 lb 12.8 oz (56.6 kg)  02/02/20 123 lb (55.8 kg)   Body mass index is 22.68 kg/m.   Physical Exam Constitutional:      General: She is not in acute distress.    Appearance: Normal appearance. She is not ill-appearing.  Musculoskeletal:        General: No swelling, tenderness (Right lower leg including calf) or deformity.     Right lower leg: No edema.     Left lower leg: No edema.  Skin:    General: Skin is warm and dry.     Findings: No bruising or erythema.  Neurological:     Mental Status: She is alert.     Sensory: No sensory deficit (Right leg).     Motor: No weakness (Right leg).            Assessment & Plan:    See Problem List for Assessment and Plan of chronic medical problems.    This visit occurred during the SARS-CoV-2 public health emergency.  Safety protocols were in place, including screening questions prior to the visit, additional usage of staff PPE, and extensive cleaning of exam room while observing appropriate contact time as indicated for disinfecting solutions.

## 2020-02-18 ENCOUNTER — Ambulatory Visit (INDEPENDENT_AMBULATORY_CARE_PROVIDER_SITE_OTHER): Payer: PPO | Admitting: Internal Medicine

## 2020-02-18 ENCOUNTER — Encounter: Payer: Self-pay | Admitting: Internal Medicine

## 2020-02-18 ENCOUNTER — Other Ambulatory Visit: Payer: Self-pay

## 2020-02-18 VITALS — BP 150/70 | HR 62 | Temp 98.3°F | Ht 62.0 in | Wt 124.0 lb

## 2020-02-18 DIAGNOSIS — M79661 Pain in right lower leg: Secondary | ICD-10-CM | POA: Insufficient documentation

## 2020-02-18 MED ORDER — TELMISARTAN 40 MG PO TABS: 40.0000 mg | ORAL_TABLET | Freq: Two times a day (BID) | ORAL | 5 refills | Status: DC

## 2020-02-18 NOTE — Patient Instructions (Addendum)
You right leg pain is likely related to nerve pain from the back.   Since it seems to be getting better just continue to monitor it for now.  If it gets worse let me know.     Continue to monitor your BP.  The goal is less than 140/90.

## 2020-02-18 NOTE — Assessment & Plan Note (Signed)
New problem Wednesday she had aching in her entire right leg with a concentration of sharp very transient pain in her right lower leg that felt like it was between the bone and the muscle.  Since then she is only had a couple of episodes of the sharp pain in her entire leg has not hurt Hard to say with the pain was-possibly radiculopathy Pain seems to be resolving so no further evaluation at this time If the pain returns she will call or return

## 2020-02-21 ENCOUNTER — Encounter (INDEPENDENT_AMBULATORY_CARE_PROVIDER_SITE_OTHER): Payer: PPO

## 2020-02-21 ENCOUNTER — Telehealth: Payer: Self-pay | Admitting: Internal Medicine

## 2020-02-21 ENCOUNTER — Other Ambulatory Visit: Payer: Self-pay | Admitting: Emergency Medicine

## 2020-02-21 DIAGNOSIS — R001 Bradycardia, unspecified: Secondary | ICD-10-CM

## 2020-02-21 DIAGNOSIS — R42 Dizziness and giddiness: Secondary | ICD-10-CM | POA: Diagnosis not present

## 2020-02-21 NOTE — Telephone Encounter (Signed)
Message left for patient

## 2020-02-21 NOTE — Telephone Encounter (Signed)
Patient called and was wondering if it was still ok for her to go to the gym. She said that she saw Dr. Quay Burow on 10.29.2021 for pain in her right lower leg. She just wanted to make sure it was still ok to go.    Please call patient back [phone]

## 2020-02-21 NOTE — Telephone Encounter (Signed)
Yes it is ok -- activity as tolerated

## 2020-02-23 ENCOUNTER — Telehealth: Payer: Self-pay

## 2020-02-23 NOTE — Telephone Encounter (Signed)
Patient left VM about wearing a heart monitor to appt. Attempted to call back to inform that was fine, but unable to reach.

## 2020-02-24 ENCOUNTER — Encounter: Payer: Self-pay | Admitting: Vascular Surgery

## 2020-02-24 ENCOUNTER — Other Ambulatory Visit: Payer: Self-pay

## 2020-02-24 ENCOUNTER — Ambulatory Visit: Payer: PPO | Admitting: Vascular Surgery

## 2020-02-24 VITALS — BP 134/67 | HR 50 | Temp 97.8°F | Resp 20 | Ht 62.0 in | Wt 126.0 lb

## 2020-02-24 DIAGNOSIS — R1030 Lower abdominal pain, unspecified: Secondary | ICD-10-CM | POA: Diagnosis not present

## 2020-02-24 NOTE — Progress Notes (Signed)
Referring Physician: Dr Pricilla Holm  Patient name: Sheila Morales MRN: [id number] DOB: 15-Mar-1951 Sex: female  REASON FOR CONSULT: Abdominal pain with aortic atherosclerosis  HPI: Sheila Morales is a 69 y.o. female, with a 2-year history of chronic constipation type symptoms.  She recently had a CT scan of the abdomen and pelvis for further evaluation of this.  She was sent here for further evaluation.  She has had about a 20 pound weight loss in the last year but was dieting.  She sometimes gets pain in her abdomen prior to eating a meal.  However, she states that she could eat a full bowl of macaroni and cheese without any abdominal pain.  She does not have food fear.  She does not have postprandial abdominal pain.  She has smoked for most of her life but quit approximately 1 month ago.  She does have mild COPD.  She does not describe claudication symptoms.  She is on aspirin.  She is not currently on a statin.  Is also currently undergoing evaluation by cardiology for bradycardia and hypertension.  Past Medical History:  Diagnosis Date  . Acute respiratory failure with hypoxia ([place]) 11/05/2017  . Arthritis   . Colitis, ischemic ([place])   . External hemorrhoids   . H/O: rheumatic fever   . IBS (irritable bowel syndrome)   . Personal history colonic adenoma 03/25/2008   06/2007 right sided adenoma and 2 right hyperplastic polyps    . Restless leg syndrome    questionable  . Systemic lupus erythematosus ([place])   . Tubular adenoma of colon   . 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 CHOLECYSTECTOMY    . CHOLECYSTECTOMY, LAPAROSCOPIC    . COLONOSCOPY    . ESOPHAGOGASTRODUODENOSCOPY    . lesion, vulva excision    . TONSILLECTOMY    . VIDEO BRONCHOSCOPY Bilateral 11/06/2017   Procedure: VIDEO BRONCHOSCOPY WITHOUT FLUORO;  Surgeon: Marshell Garfinkel, MD;  Location: [facility];  Service: Cardiopulmonary;  Laterality: Bilateral;     Family History  Problem Relation Age of Onset  . Stroke Mother        Deceased, 39s  . Atrial fibrillation Father   . Colon cancer Father   . Other Sister        Pick's disease, deceased  . Healthy Daughter     SOCIAL HISTORY: Social History   Socioeconomic History  . Marital status: Married    Spouse name: Not on file  . Number of children: 2  . Years of education: 84  . Highest education level: Not on file  Occupational History  . Occupation: Education administrator business  . Occupation: caregiver  Tobacco Use  . Smoking status: Former Smoker    Packs/day: 0.25    Years: 30.00    Pack years: 7.50    Types: Cigarettes    Quit date: 01/21/2020    Years since quitting: 0.0  . Smokeless tobacco: Never Used  . Tobacco comment: Declined info  Vaping Use  . Vaping Use: Never used  Substance and Sexual Activity  . Alcohol use: Yes    Alcohol/week: 5.0 standard drinks    Types: 5 Standard drinks or equivalent per week    Comment: 2 glasses of wine nightly, 4 beers nightly on weekend  . Drug use: No  . Sexual activity: Yes  Other Topics Concern  . Not on file  Social History Narrative   HSG.  Married '71. 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       One level home with spouse   Right handed   Caffeine - coffee 2-3 cups am   Exercise - treadmill    Education - 12th grade   Social Determinants of Health   Financial Resource Strain: Low Risk   . Difficulty of Paying Living Expenses: Not hard at all  Food Insecurity:   . Worried About Charity fundraiser in the Last Year: Not on file  . Ran Out of Food in the Last Year: Not on file  Transportation Needs:   . Lack of Transportation (Medical): Not on file  . Lack of Transportation (Non-Medical): Not on file  Physical Activity: Sufficiently Active  . Days of Exercise per Week: 6 days  . Minutes of Exercise per Session: 40 min  Stress:    . Feeling of Stress : Not on file  Social Connections:   . Frequency of Communication with Friends and Family: Not on file  . Frequency of Social Gatherings with Friends and Family: Not on file  . Attends Religious Services: Not on file  . Active Member of Clubs or Organizations: Not on file  . Attends Archivist Meetings: Not on file  . Marital Status: Not on file  Intimate Partner Violence:   . Fear of Current or Ex-Partner: Not on file  . Emotionally Abused: Not on file  . Physically Abused: Not on file  . Sexually Abused: Not on file    No Known Allergies  Current Outpatient Medications  Medication Sig Dispense Refill  . albuterol (VENTOLIN HFA) 108 (90 Base) MCG/ACT inhaler Inhale 1-2 puffs into the lungs every 6 (six) hours as needed for wheezing or shortness of breath. 8 g 2  . amLODipine (NORVASC) 5 MG tablet Take 1 tablet (5 mg total) by mouth daily. 30 tablet 3  . aspirin 81 MG tablet Take 81 mg by mouth daily.    Marland Kitchen buPROPion (WELLBUTRIN SR) 150 MG 12 hr tablet Take 1 tab daily X3 days, then increase to 1 tab twice daily. 60 tablet 2  . calcium carbonate (OS-CAL - DOSED IN MG OF ELEMENTAL CALCIUM) 1250 (500 Ca) MG tablet Take 1 tablet by mouth.    . chlorpheniramine-HYDROcodone (TUSSIONEX PENNKINETIC ER) 10-8 MG/5ML SUER Take 5 mLs by mouth every 12 (twelve) hours as needed for cough. 100 mL 0  . cholecalciferol (VITAMIN D) 1000 UNITS tablet Take 1,000 Units by mouth daily.      Marland Kitchen dicyclomine (BENTYL) 10 MG capsule Take 1 capsule (10 mg total) by mouth 4 (four) times daily -  before meals and at bedtime. 90 capsule 0  . folic acid (FOLVITE) 1 MG tablet Take 1 mg by mouth daily.    Marland Kitchen gabapentin (NEURONTIN) 300 MG capsule 2 po qam, 2 po afternoon, 3 po qhs 630 capsule 1  . hydroxychloroquine (PLAQUENIL) 200 MG tablet Take 200 mg by mouth 2 (two) times daily.    . hydrOXYzine (ATARAX/VISTARIL) 10 MG tablet Take 1 tablet (10 mg total) by mouth 3 (three) times daily as  needed. 45 tablet 0  . Magnesium-Potassium 250-100 MG TABS Take by mouth.    . methotrexate 2.5 MG tablet Take 10 mg by mouth once a week. Thursdays    . MULTIPLE VITAMIN PO Take 1 tablet by mouth daily.      . Omega-3 1000 MG CAPS  Take 1,000 mg by mouth daily.    . RESTASIS 0.05 % ophthalmic emulsion INSTILL 1 DROP INTO BOTH EYES TWICE A DAY  6  . saccharomyces boulardii (FLORASTOR) 250 MG capsule Take 250 mg by mouth 2 (two) times daily.    Marland Kitchen telmisartan (MICARDIS) 40 MG tablet Take 1 tablet (40 mg total) by mouth in the morning and at bedtime. 60 tablet 5  . triamcinolone cream (KENALOG) 0.1 % Apply 1 application topically 2 (two) times daily. 30 g 0  . triamcinolone ointment (KENALOG) 0.5 % Apply 1 application topically 2 (two) times daily. 30 g 0  . umeclidinium-vilanterol (ANORO ELLIPTA) 62.5-25 MCG/INH AEPB INHALE ONE PUFF BY MOUTH ONCE DAILY 60 each 5  . vitamin C (ASCORBIC ACID) 500 MG tablet Take 500 mg by mouth daily.      . vitamin E 1000 UNIT capsule Take 1,000 Units by mouth daily.    Marland Kitchen zolpidem (AMBIEN) 5 MG tablet TAKE ONE TABLET BY MOUTH EVERYDAY AT BEDTIME AS NEEDED 30 tablet 5   No current facility-administered medications for this visit.    ROS:   General:  +weight loss, no fever, no chills  HEENT: No recent headaches, no nasal bleeding, no visual changes, no sore throat  Neurologic: No dizziness, blackouts, seizures. No recent symptoms of stroke or mini- stroke. No recent episodes of slurred speech, or temporary blindness.  Cardiac: No recent episodes of chest pain/pressure, no shortness of breath at rest.  No shortness of breath with exertion.  Denies history of atrial fibrillation or irregular heartbeat  Vascular: No history of rest pain in feet.  No history of claudication.  No history of non-healing ulcer, No history of DVT   Pulmonary: No home oxygen, no productive cough, no hemoptysis,  + asthma or wheezing  Musculoskeletal:  [ ]  Arthritis, [ ]  Low back  pain,  [ ]  Joint pain  Hematologic:No history of hypercoagulable state.  No history of easy bleeding.  No history of anemia  Gastrointestinal: No hematochezia or melena,  No gastroesophageal reflux, no trouble swallowing  Urinary: [ ]  chronic Kidney disease, [ ]  on HD - [ ]  MWF or [ ]  TTHS, [ ]  Burning with urination, [ ]  Frequent urination, [ ]  Difficulty urinating;   Skin: No rashes  Psychological: No history of anxiety,  No history of depression   Physical Examination  Vitals:   02/24/20 0943  BP: 134/67  Pulse: (!) 50  Resp: 20  Temp: 97.8 F (36.6 C)  SpO2: 93%  Weight: 126 lb (57.2 kg)  Height: 5\' 2"  (1.575 m)    Body mass index is 23.05 kg/m.  General:  Alert and oriented, no acute distress HEENT: Normal Neck: No JVD Cardiac: Regular Rate and Rhythm Abdomen: Soft, non-tender, non-distended, no mass Skin: No rash Extremity Pulses:  2+ radial, brachial, femoral, dorsalis pedis pulses bilaterally Musculoskeletal: No deformity or edema  Neurologic: Upper and lower extremity motor 5/5 and symmetric  DATA:  I reviewed the patient's recent CT scan of the abdomen and pelvis.  This showed aortic calcification but no significant narrowing of the celiac or superior mesenteric or renal arteries.  ASSESSMENT: Abdominal pain and chronic constipation.  No evidence of significant narrowing of the superior mesenteric or celiac artery to explain her symptoms.  Patient does have aortic atherosclerosis.  PLAN: Patient will follow up with me on as-needed basis if her symptoms worsen over time or she develops food fear or postprandial abdominal pain.   Ruta Hinds, MD  [facility] Office: [phone]

## 2020-02-26 ENCOUNTER — Encounter: Payer: Self-pay | Admitting: Internal Medicine

## 2020-02-28 NOTE — Telephone Encounter (Signed)
   Please call patient to discuss MyChart message

## 2020-02-28 NOTE — Addendum Note (Signed)
Addended by: Binnie Rail on: 02/28/2020 07:37 AM   Modules accepted: Orders

## 2020-02-29 MED ORDER — AMLODIPINE BESYLATE 5 MG PO TABS
2.5000 mg | ORAL_TABLET | Freq: Every day | ORAL | 3 refills | Status: DC
Start: 2020-02-29 — End: 2020-04-04

## 2020-02-29 MED ORDER — DOXAZOSIN MESYLATE 1 MG PO TABS: 1.0000 mg | ORAL_TABLET | Freq: Every day | ORAL | 2 refills | Status: DC

## 2020-02-29 NOTE — Addendum Note (Signed)
Addended by: Binnie Rail on: 02/29/2020 07:47 AM   Modules accepted: Orders

## 2020-03-09 ENCOUNTER — Other Ambulatory Visit: Payer: Self-pay

## 2020-03-09 ENCOUNTER — Ambulatory Visit (HOSPITAL_COMMUNITY): Payer: PPO | Attending: Cardiovascular Disease

## 2020-03-09 DIAGNOSIS — R42 Dizziness and giddiness: Secondary | ICD-10-CM

## 2020-03-09 DIAGNOSIS — R001 Bradycardia, unspecified: Secondary | ICD-10-CM | POA: Diagnosis not present

## 2020-03-09 LAB — ECHOCARDIOGRAM COMPLETE
Area-P 1/2: 3.2 cm2
S' Lateral: 3.1 cm

## 2020-03-10 ENCOUNTER — Encounter: Payer: Self-pay | Admitting: Internal Medicine

## 2020-03-20 ENCOUNTER — Telehealth: Payer: Self-pay

## 2020-03-20 DIAGNOSIS — M5414 Radiculopathy, thoracic region: Secondary | ICD-10-CM | POA: Diagnosis not present

## 2020-03-20 DIAGNOSIS — M9901 Segmental and somatic dysfunction of cervical region: Secondary | ICD-10-CM | POA: Diagnosis not present

## 2020-03-20 DIAGNOSIS — M9902 Segmental and somatic dysfunction of thoracic region: Secondary | ICD-10-CM | POA: Diagnosis not present

## 2020-03-20 DIAGNOSIS — M5417 Radiculopathy, lumbosacral region: Secondary | ICD-10-CM | POA: Diagnosis not present

## 2020-03-20 DIAGNOSIS — M531 Cervicobrachial syndrome: Secondary | ICD-10-CM | POA: Diagnosis not present

## 2020-03-20 DIAGNOSIS — M9903 Segmental and somatic dysfunction of lumbar region: Secondary | ICD-10-CM | POA: Diagnosis not present

## 2020-03-20 NOTE — Telephone Encounter (Signed)
Attempted to call patient in regards to receiving critical results from preventice heart monitor in triage.   Per monitor strip patient had Sinus Bradycardia, 3.1 second pause, and junctional bradycardia with rates of 20-21. These occurences looked to take place 11/25 at 0112, 0113.   Attempted to call patient to check and see if she had any symptoms during these occurences but was unable to reach her. Left message on voicemail for patient to return call to office when able.   Reviewed results with DOD (Dr. Harrell Gave) who advised to continue to monitor if asymptomatic. Awaiting call back from patient to discuss.

## 2020-03-21 NOTE — Telephone Encounter (Signed)
Spoke with patient in regards to patients heart monitor notification from yesterday. Patient states that she can't remember what she was doing during the time of the slow heart rates and denies any symptoms. Patient states that she is feeling more fatigued during the day but patient states she is also waking during the night multiple times. Patient states that other wise she feels fine and denies dizziness, presyncope, chest pain, shortness of breath or any other symptoms. Advised patient I would forward message to Dr. [name] and advised patient to continue wearing heart monitor until finished with the 30 days and to notify us of any changes or symptoms. Patient verbalized understanding.

## 2020-03-23 ENCOUNTER — Other Ambulatory Visit: Payer: Self-pay | Admitting: Internal Medicine

## 2020-03-23 DIAGNOSIS — Z79899 Other long term (current) drug therapy: Secondary | ICD-10-CM | POA: Diagnosis not present

## 2020-03-23 DIAGNOSIS — R001 Bradycardia, unspecified: Secondary | ICD-10-CM | POA: Diagnosis not present

## 2020-03-23 DIAGNOSIS — L932 Other local lupus erythematosus: Secondary | ICD-10-CM | POA: Diagnosis not present

## 2020-03-23 DIAGNOSIS — Z6823 Body mass index (BMI) 23.0-23.9, adult: Secondary | ICD-10-CM | POA: Diagnosis not present

## 2020-03-23 DIAGNOSIS — I10 Essential (primary) hypertension: Secondary | ICD-10-CM | POA: Diagnosis not present

## 2020-03-23 DIAGNOSIS — M328 Other forms of systemic lupus erythematosus: Secondary | ICD-10-CM | POA: Diagnosis not present

## 2020-03-23 DIAGNOSIS — I7300 Raynaud's syndrome without gangrene: Secondary | ICD-10-CM | POA: Diagnosis not present

## 2020-03-24 ENCOUNTER — Telehealth: Payer: Self-pay | Admitting: Pharmacist

## 2020-03-24 ENCOUNTER — Telehealth: Payer: Self-pay | Admitting: Internal Medicine

## 2020-03-24 NOTE — Progress Notes (Addendum)
Chronic Care Management Pharmacy Assistant   Name: Sheila Morales  MRN: [id number] DOB: 20-Jun-1950  Reason for Encounter: Medication Review  Patient Questions:  1.  Have you seen any other providers since your last visit? Yes, patient last seen Dr. Billey Gosling on 02/18/20 and Dr, Oneida Alar on 02/24/20  2.  Any changes in your medicines or health? Yes, Dr. Quay Burow increased Telemisartan 40 mg   PCP : Hoyt Koch, MD  Allergies:  No Known Allergies  Medications: Outpatient Encounter Medications as of 03/24/2020  Medication Sig   albuterol (VENTOLIN HFA) 108 (90 Base) MCG/ACT inhaler Inhale 1-2 puffs into the lungs every 6 (six) hours as needed for wheezing or shortness of breath.   amLODipine (NORVASC) 5 MG tablet Take 0.5 tablets (2.5 mg total) by mouth daily.   aspirin 81 MG tablet Take 81 mg by mouth daily.   buPROPion (WELLBUTRIN SR) 150 MG 12 hr tablet Take 1 tab daily X3 days, then increase to 1 tab twice daily.   calcium carbonate (OS-CAL - DOSED IN MG OF ELEMENTAL CALCIUM) 1250 (500 Ca) MG tablet Take 1 tablet by mouth.   cholecalciferol (VITAMIN D) 1000 UNITS tablet Take 1,000 Units by mouth daily.     dicyclomine (BENTYL) 10 MG capsule Take 1 capsule (10 mg total) by mouth 4 (four) times daily -  before meals and at bedtime.   doxazosin (CARDURA) 1 MG tablet Take 1 tablet (1 mg total) by mouth daily.   folic acid (FOLVITE) 1 MG tablet Take 1 mg by mouth daily.   gabapentin (NEURONTIN) 300 MG capsule TAKE TWO CAPSULES BY MOUTH EVERY MORNING, TWO capsules each afternoon AND THREE CAPSULES AT bedtime   hydroxychloroquine (PLAQUENIL) 200 MG tablet Take 200 mg by mouth 2 (two) times daily.   hydrOXYzine (ATARAX/VISTARIL) 10 MG tablet Take 1 tablet (10 mg total) by mouth 3 (three) times daily as needed.   Magnesium-Potassium 250-100 MG TABS Take by mouth.   methotrexate 2.5 MG tablet Take 10 mg by mouth once a week. Thursdays   MULTIPLE VITAMIN PO Take 1 tablet by mouth daily.      Omega-3 1000 MG CAPS Take 1,000 mg by mouth daily.   RESTASIS 0.05 % ophthalmic emulsion INSTILL 1 DROP INTO BOTH EYES TWICE A DAY   saccharomyces boulardii (FLORASTOR) 250 MG capsule Take 250 mg by mouth 2 (two) times daily.   telmisartan (MICARDIS) 40 MG tablet Take 1 tablet (40 mg total) by mouth in the morning and at bedtime.   triamcinolone cream (KENALOG) 0.1 % Apply 1 application topically 2 (two) times daily.   triamcinolone ointment (KENALOG) 0.5 % Apply 1 application topically 2 (two) times daily.   umeclidinium-vilanterol (ANORO ELLIPTA) 62.5-25 MCG/INH AEPB INHALE ONE PUFF BY MOUTH ONCE DAILY   vitamin C (ASCORBIC ACID) 500 MG tablet Take 500 mg by mouth daily.     vitamin E 1000 UNIT capsule Take 1,000 Units by mouth daily.   zolpidem (AMBIEN) 5 MG tablet TAKE ONE TABLET BY MOUTH EVERYDAY AT BEDTIME AS NEEDED   No facility-administered encounter medications on file as of 03/24/2020.    Current Diagnosis: Patient Active Problem List   Diagnosis Date Noted   Pain in right lower leg 02/18/2020   Episodic lightheadedness 02/02/2020   Hypertension 02/02/2020   LLQ pain 11/23/2019   Cyst of buttocks 10/05/2019   Methotrexate, long term, current use 07/29/2019   BPPV (benign paroxysmal positional vertigo) 12/16/2017   Insomnia 02/24/2017  COPD (chronic obstructive pulmonary disease) ([place]) 01/22/2017   Routine general medical examination at a health care facility 02/19/2016   Hereditary and idiopathic peripheral neuropathy 02/17/2015   Smokers' cough ([place]) 12/07/2014   Systemic lupus erythematosus ([place]) 10/13/2013   Cough 01/18/2009   Rash 05/31/2008   Irritable bowel syndrome 03/25/2008   RHEUMATIC FEVER, HX OF 01/17/2007    Goals Addressed   None     Follow-Up:  Coordination of Enhanced Pharmacy Services     Reviewed chart for medication changes ahead of medication coordination call.  No OVs, Consults, or hospital visits since last care coordination  call/Pharmacist visit.  No medication changes indicated   BP Readings from Last 3 Encounters:  02/24/20 134/67  02/18/20 (!) 150/70  02/14/20 (!) 160/86    Lab Results  Component Value Date   HGBA1C 5.7 02/24/2017     Patient obtains medications through Vials  90 Days   Last adherence delivery included:  Zolpidem tartrate 5 mg take 1 tab by mouth everyday at bedtime Telmisartan 40 mg take 2 tab by mouth daily anoro ellipt aer 62.5-25 inhale 1 puff by mouth daily   Patient is due for next adherence delivery on: 04/03/20. Called patient and reviewed medications and coordinated delivery.  This delivery to include: Gabapentin 300 mg take 2 caps in the morning and 2 caps in the afternoon and 3 caps at bedtime  Patient declined the following medications Amlodipine, zolpidem, albuterol, and methotrexate patient has an abundant supply on hand.  Confirmed delivery date of 04/03/20, advised patient that pharmacy will contact them the morning of delivery.   Wendy Poet, Clinical Pharmacist Assistant [facility]

## 2020-03-24 NOTE — Telephone Encounter (Signed)
None of these should cause cough.

## 2020-03-24 NOTE — Telephone Encounter (Signed)
telmisartan (MICARDIS) 40 MG tablet doxazosin (CARDURA) 1 MG tablet amLODipine (NORVASC) 5 MG tablet Patient states she has been having a bad cough and she heard from some of her friends when they started blood pressure medication it also happened to them so she was wondering if the cough could be connected to these medications.

## 2020-03-27 NOTE — Telephone Encounter (Signed)
Left detailed message informing pt of below and to call back if her cough doesn't resolve or she feels she needs further evaluation.

## 2020-03-31 ENCOUNTER — Other Ambulatory Visit: Payer: Self-pay | Admitting: Internal Medicine

## 2020-04-03 ENCOUNTER — Ambulatory Visit (INDEPENDENT_AMBULATORY_CARE_PROVIDER_SITE_OTHER): Payer: PPO

## 2020-04-03 VITALS — BP 124/70 | HR 38 | Temp 98.1°F | Ht 62.0 in | Wt 132.2 lb

## 2020-04-03 DIAGNOSIS — Z0000 Encounter for general adult medical examination without abnormal findings: Secondary | ICD-10-CM | POA: Diagnosis not present

## 2020-04-03 NOTE — Progress Notes (Signed)
Subjective:   Sheila Morales is a 69 y.o. female who presents for Medicare Annual (Subsequent) preventive examination.  Review of Systems    No ROS. Medicare Wellness Visit. Additional risk factors are reflected in social history. Cardiac Risk Factors include: advanced age (>22men, >44 women);hypertension;family history of premature cardiovascular disease     Objective:    Today's Vitals   04/03/20 0849  BP: 124/70  Pulse: (!) 38  Temp: 98.1 F (36.7 C)  SpO2: 98%  Weight: 132 lb 3.2 oz (60 kg)  Height: 5\' 2"  (1.575 m)  PainSc: 0-No pain   Body mass index is 24.18 kg/m.  Advanced Directives 04/03/2020 02/24/2020 02/08/2019 11/06/2017  Does Patient Have a Medical Advance Directive? Yes Yes Yes Yes  Type of Advance Directive Living will [place];Living will [place];Living will Living will  Does patient want to make changes to medical advance directive? No - Patient declined No - Patient declined - -  Would patient like information on creating a medical advance directive? No - Patient declined - - -    Current Medications (verified) Outpatient Encounter Medications as of 04/03/2020  Medication Sig  . albuterol (VENTOLIN HFA) 108 (90 Base) MCG/ACT inhaler Inhale 1-2 puffs into the lungs every 6 (six) hours as needed for wheezing or shortness of breath.  Marland Kitchen amLODipine (NORVASC) 5 MG tablet Take 0.5 tablets (2.5 mg total) by mouth daily.  Marland Kitchen aspirin 81 MG tablet Take 81 mg by mouth daily.  Marland Kitchen buPROPion (WELLBUTRIN SR) 150 MG 12 hr tablet Take 1 tab daily X3 days, then increase to 1 tab twice daily.  . calcium carbonate (OS-CAL - DOSED IN MG OF ELEMENTAL CALCIUM) 1250 (500 Ca) MG tablet Take 1 tablet by mouth.  . cholecalciferol (VITAMIN D) 1000 UNITS tablet Take 1,000 Units by mouth daily.    Marland Kitchen dicyclomine (BENTYL) 10 MG capsule Take 1 capsule (10 mg total) by mouth 4 (four) times daily -  before meals and at bedtime.  Marland Kitchen doxazosin (CARDURA)  1 MG tablet TAKE ONE TABLET BY MOUTH ONCE DAILY  . folic acid (FOLVITE) 1 MG tablet Take 1 mg by mouth daily.  Marland Kitchen gabapentin (NEURONTIN) 300 MG capsule TAKE TWO CAPSULES BY MOUTH EVERY MORNING, TWO capsules each afternoon AND THREE CAPSULES AT bedtime  . hydroxychloroquine (PLAQUENIL) 200 MG tablet Take 200 mg by mouth 2 (two) times daily.  . hydrOXYzine (ATARAX/VISTARIL) 10 MG tablet Take 1 tablet (10 mg total) by mouth 3 (three) times daily as needed.  . Magnesium-Potassium 250-100 MG TABS Take by mouth.  . methotrexate 2.5 MG tablet Take 10 mg by mouth once a week. Thursdays  . MULTIPLE VITAMIN PO Take 1 tablet by mouth daily.    . Omega-3 1000 MG CAPS Take 1,000 mg by mouth daily.  . RESTASIS 0.05 % ophthalmic emulsion INSTILL 1 DROP INTO BOTH EYES TWICE A DAY  . saccharomyces boulardii (FLORASTOR) 250 MG capsule Take 250 mg by mouth 2 (two) times daily.  Marland Kitchen telmisartan (MICARDIS) 40 MG tablet Take 1 tablet (40 mg total) by mouth in the morning and at bedtime.  . triamcinolone cream (KENALOG) 0.1 % Apply 1 application topically 2 (two) times daily.  Marland Kitchen triamcinolone ointment (KENALOG) 0.5 % Apply 1 application topically 2 (two) times daily.  Marland Kitchen umeclidinium-vilanterol (ANORO ELLIPTA) 62.5-25 MCG/INH AEPB INHALE ONE PUFF BY MOUTH ONCE DAILY  . vitamin C (ASCORBIC ACID) 500 MG tablet Take 500 mg by mouth daily.    Marland Kitchen  vitamin E 1000 UNIT capsule Take 1,000 Units by mouth daily.  Marland Kitchen zolpidem (AMBIEN) 5 MG tablet TAKE ONE TABLET BY MOUTH EVERYDAY AT BEDTIME AS NEEDED   No facility-administered encounter medications on file as of 04/03/2020.    Allergies (verified) Patient has no known allergies.   History: Past Medical History:  Diagnosis Date  . Acute respiratory failure with hypoxia ([place]) 11/05/2017  . Arthritis   . Colitis, ischemic ([place])   . External hemorrhoids   . H/O: rheumatic fever   . IBS (irritable bowel syndrome)   . Personal history colonic adenoma 03/25/2008   06/2007 right  sided adenoma and 2 right hyperplastic polyps    . Restless leg syndrome    questionable  . Systemic lupus erythematosus ([place])   . Tubular adenoma of colon   . 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 CHOLECYSTECTOMY    . CHOLECYSTECTOMY, LAPAROSCOPIC    . COLONOSCOPY    . ESOPHAGOGASTRODUODENOSCOPY    . lesion, vulva excision    . TONSILLECTOMY    . VIDEO BRONCHOSCOPY Bilateral 11/06/2017   Procedure: VIDEO BRONCHOSCOPY WITHOUT FLUORO;  Surgeon: Marshell Garfinkel, MD;  Location: [facility];  Service: Cardiopulmonary;  Laterality: Bilateral;   Family History  Problem Relation Age of Onset  . Stroke Mother        Deceased, 74s  . Atrial fibrillation Father   . Colon cancer Father   . Other Sister        Pick's disease, deceased  . Healthy Daughter    Social History   Socioeconomic History  . Marital status: Married    Spouse name: Not on file  . Number of children: 2  . Years of education: 41  . Highest education level: Not on file  Occupational History  . Occupation: Education administrator business  . Occupation: caregiver  Tobacco Use  . Smoking status: Former Smoker    Packs/day: 0.25    Years: 30.00    Pack years: 7.50    Types: Cigarettes    Quit date: 01/21/2020    Years since quitting: 0.2  . Smokeless tobacco: Never Used  . Tobacco comment: Declined info  Vaping Use  . Vaping Use: Never used  Substance and Sexual Activity  . Alcohol use: Yes    Alcohol/week: 5.0 standard drinks    Types: 5 Standard drinks or equivalent per week    Comment: 2 glasses of wine nightly, 4 beers nightly on weekend  . Drug use: No  . Sexual activity: Yes  Other Topics Concern  . Not on file  Social History Narrative   HSG. Married '71. 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       One level home with spouse   Right handed   Caffeine - coffee 2-3  cups am   Exercise - treadmill    Education - 12th grade   Social Determinants of Health   Financial Resource Strain: Low Risk   . Difficulty of Paying Living Expenses: Not hard at all  Food Insecurity: No Food Insecurity  . Worried About Charity fundraiser in the Last Year: Never true  . Ran Out of Food in the Last Year: Never true  Transportation Needs: No Transportation Needs  . Lack of Transportation (Medical): No  . Lack of Transportation (Non-Medical): No  Physical Activity: Sufficiently Active  . Days  of Exercise per Week: 5 days  . Minutes of Exercise per Session: 30 min  Stress: No Stress Concern Present  . Feeling of Stress : Not at all  Social Connections: Moderately Integrated  . Frequency of Communication with Friends and Family: More than three times a week  . Frequency of Social Gatherings with Friends and Family: More than three times a week  . Attends Religious Services: More than 4 times per year  . Active Member of Clubs or Organizations: No  . Attends Archivist Meetings: Never  . Marital Status: Married    Tobacco Counseling Counseling given: Not Answered Comment: Declined info   Clinical Intake:  Pre-visit preparation completed: Yes  Pain : No/denies pain Pain Score: 0-No pain     BMI - recorded: 24.18 Nutritional Status: BMI of 19-24  Normal Nutritional Risks: None Diabetes: No  How often do you need to have someone help you when you read instructions, pamphlets, or other written materials from your doctor or pharmacy?: 1 - Never What is the last grade level you completed in school?: HSG  Diabetic? no  Interpreter Needed?: No  Information entered by :: Lisette Abu, LPN   Activities of Daily Living In your present state of health, do you have any difficulty performing the following activities: 04/03/2020 01/21/2020  Hearing? Tempie Donning  Vision? N N  Difficulty concentrating or making decisions? N N  Walking or climbing  stairs? N N  Dressing or bathing? N N  Doing errands, shopping? N N  Preparing Food and eating ? N -  Using the Toilet? N -  In the past six months, have you accidently leaked urine? N -  Do you have problems with loss of bowel control? N -  Managing your Medications? N -  Managing your Finances? N -  Some recent data might be hidden    Patient Care Team: Hoyt Koch, MD as PCP - General (Internal Medicine) Gavin Pound, MD (Rheumatology) Gus Height, MD (Inactive) (Obstetrics and Gynecology) Alda Berthold, DO as Consulting Physician (Neurology) Charlton Haws, [facility] as Pharmacist (Pharmacist)  Indicate any recent Medical Services you may have received from other than [facility] providers in the past year (date may be approximate).     Assessment:   This is a routine wellness examination for [place].  Hearing/Vision screen No exam data present  Dietary issues and exercise activities discussed: Current Exercise Habits: The patient has a physically strenuous job, but has no regular exercise apart from work., Time (Minutes): 30, Frequency (Times/Week): 5, Weekly Exercise (Minutes/Week): 150, Intensity: Moderate, Exercise limited by: respiratory conditions(s)  Goals    . Pharmacy Care Plan     CARE PLAN ENTRY (see longitudinal plan of care for additional care plan information)  Current Barriers:  . Chronic Disease Management support, education, and care coordination needs related to Hypertension, COPD, and Tobacco use   Hypertension BP Readings from Last 3 Encounters:  01/21/20 (!) 180/82  11/22/19 116/74  11/07/19 (!) 173/70 .  Pharmacist Clinical Goal(s): o Over the next 180 days, patient will work with PharmD and providers to maintain BP goal <130/80 . Current regimen:  o Amlodipine 2.5 mg BID . Interventions: o Discussed BP goals and benefits of medications for prevention of heart attack / stroke . Patient self care activities - Over the next 180 days,  patient will: o Check BP daily, document, and provide at future appointments o Ensure daily salt intake < 2300 mg/day o Keep cardiology  appt as scheduled  COPD . Pharmacist Clinical Goal(s) o Over the next 180 days, patient will work with PharmD and providers to optimize therapy . Current regimen:  o Anoro Ellipta 1 puff daily o Albuterol HFA as needed . Interventions: o Discussed role of inhaler and importance of adherence to control symptoms and slow progression of disease . Patient self care activities - Over the next 180 days, patient will: o Continue inhalers as directed  Smoking cessation . Pharmacist Clinical Goal(s) o Over the next 180 days, patient will work with PharmD and providers to quit smoking . Current regimen:  o Bupropion SR 150 mg twice a day . Interventions: o Discussed benefits of bupropion to help quit smoking . Patient self care activities - Over the next 180 days, patient will: o Continue medication as directed o Reduce number of cigarettes per day  Medication management . Pharmacist Clinical Goal(s): o Over the next 180 days, patient will work with PharmD and providers to achieve optimal medication adherence . Current pharmacy: [facility]  . Interventions o Comprehensive medication review performed. o Utilize [facility] pharmacy for medication synchronization, packaging and delivery . Patient self care activities - Over the next 180 days, patient will: o Focus on medication adherence by pill box o Take medications as prescribed o Report any questions or concerns to PharmD and/or provider(s)  Initial goal documentation      Depression Screen PHQ 2/9 Scores 04/03/2020 09/27/2019 02/24/2017 02/19/2016 02/16/2015  PHQ - 2 Score 1 0 0 0 0    Fall Risk Fall Risk  04/03/2020 02/18/2020 02/08/2019 02/02/2018 02/24/2017  Falls in the past year? 0 0 1 No No  Number falls in past yr: 0 0 0 - -  Injury with Fall? 0 0 0 - -  Risk for fall due to : No Fall Risks  No Fall Risks - - -  Follow up Falls evaluation completed;Education provided Falls evaluation completed - - -    FALL RISK PREVENTION PERTAINING TO THE HOME:  Any stairs in or around the home? No  If so, are there any without handrails? No  Home free of loose throw rugs in walkways, pet beds, electrical cords, etc? Yes  Adequate lighting in your home to reduce risk of falls? Yes   ASSISTIVE DEVICES UTILIZED TO PREVENT FALLS:  Life alert? No  Use of a cane, walker or w/c? No  Grab bars in the bathroom? No  Shower chair or bench in shower? No  Elevated toilet seat or a handicapped toilet? No   TIMED UP AND GO:  Was the test performed? No .  Length of time to ambulate 10 feet: 0 sec.   Gait steady and fast without use of assistive device  Cognitive Function: Normal cognitive status assessed by direct observation by this Nurse Health Advisor. No abnormalities found.          Immunizations Immunization History  Administered Date(s) Administered  . Fluad Quad(high Dose 65+) 03/29/2019  . H1N1 05/31/2008  . Influenza Split 02/13/2012  . Influenza Whole 01/21/2008, 01/17/2009  . Influenza, High Dose Seasonal PF 02/19/2016, 02/05/2018  . Influenza,inj,Quad PF,6+ Mos 01/21/2014  . Influenza-Unspecified 02/02/2015, 02/03/2017  . Pneumococcal Conjugate-13 02/19/2016  . Pneumococcal Polysaccharide-23 02/24/2017  . Tdap 03/29/2019    TDAP status: Up to date  Flu Vaccine status: Up to date  Pneumococcal vaccine status: Up to date  Covid-19 vaccine status: Declined, Education has been provided regarding the importance of this vaccine but patient still declined. Advised  may receive this vaccine at local pharmacy or Health Dept.or vaccine clinic. Aware to provide a copy of the vaccination record if obtained from local pharmacy or Health Dept. Verbalized acceptance and understanding.  Qualifies for Shingles Vaccine? Yes   Zostavax completed No   Shingrix Completed?: No.     Education has been provided regarding the importance of this vaccine. Patient has been advised to call insurance company to determine out of pocket expense if they have not yet received this vaccine. Advised may also receive vaccine at local pharmacy or Health Dept. Verbalized acceptance and understanding.  Screening Tests Health Maintenance  Topic Date Due  . MAMMOGRAM  02/26/2018  . COVID-19 Vaccine (1) 06/20/2020 (Originally 11/01/1962)  . INFLUENZA VACCINE  07/20/2020 (Originally 11/21/2019)  . COLONOSCOPY  07/04/2024  . TETANUS/TDAP  03/28/2029  . DEXA SCAN  Completed  . Hepatitis C Screening  Completed  . PNA vac Low Risk Adult  Completed    Health Maintenance  Health Maintenance Due  Topic Date Due  . MAMMOGRAM  02/26/2018    Colorectal cancer screening: Type of screening: Colonoscopy. Completed 07/05/2014. Repeat every 10 years  Mammogram status: Completed 02/27/2016. Repeat every year  Bone Density status: Completed 04/06/2019. Results reflect: Bone density results: NORMAL. Repeat every 5 years.  Lung Cancer Screening: (Low Dose CT Chest recommended if Age 47-80 years, 30 pack-year currently smoking OR have quit w/in 15years.) does qualify.   Lung Cancer Screening Referral: no  Additional Screening:  Hepatitis C Screening: does qualify; Completed yes  Vision Screening: Recommended annual ophthalmology exams for early detection of glaucoma and other disorders of the eye. Is the patient up to date with their annual eye exam?  Yes  Who is the provider or what is the name of the office in which the patient attends annual eye exams? [facility] If pt is not established with a provider, would they like to be referred to a provider to establish care? No .   Dental Screening: Recommended annual dental exams for proper oral hygiene  Community Resource Referral / Chronic Care Management: CRR required this visit?  No   CCM required this visit?  No      Plan:      I have personally reviewed and noted the following in the patient's chart:   . Medical and social history . Use of alcohol, tobacco or illicit drugs  . Current medications and supplements . Functional ability and status . Nutritional status . Physical activity . Advanced directives . List of other physicians . Hospitalizations, surgeries, and ER visits in previous 12 months . Vitals . Screenings to include cognitive, depression, and falls . Referrals and appointments  In addition, I have reviewed and discussed with patient certain preventive protocols, quality metrics, and best practice recommendations. A written personalized care plan for preventive services as well as general preventive health recommendations were provided to patient.     Sheral Flow, LPN   [id number]   Nurse Notes: n/a

## 2020-04-03 NOTE — Patient Instructions (Signed)
Sheila Morales , Thank you for taking time to come for your Medicare Wellness Visit. I appreciate your ongoing commitment to your health goals. Please review the following plan we discussed and let me know if I can assist you in the future.   Screening recommendations/referrals: Colonoscopy: 07/05/2014; due every 10 year Mammogram: 02/27/2016 Bone Density: 04/06/2019; due every 5 years Recommended yearly ophthalmology/optometry visit for glaucoma screening and checkup Recommended yearly dental visit for hygiene and checkup  Vaccinations: Influenza vaccine: 2021 Pneumococcal vaccine: up to date Tdap vaccine: 03/29/2019; due every 10 years Shingles vaccine: never done   Covid-19: decline  Advanced directives: Please bring a copy of your health care power of attorney and living will to the office at your convenience.  Conditions/risks identified: Yes; Reviewed health maintenance screenings with patient today and relevant education, vaccines, and/or referrals were provided. Please continue to do your personal lifestyle choices by: daily care of teeth and gums, regular physical activity (goal should be 5 days a week for 30 minutes), eat a healthy diet, avoid tobacco and drug use, limiting any alcohol intake, taking a low-dose aspirin (if not allergic or have been advised by your provider otherwise) and taking vitamins and minerals as recommended by your provider. Continue doing brain stimulating activities (puzzles, reading, adult coloring books, staying active) to keep memory sharp. Continue to eat heart healthy diet (full of fruits, vegetables, whole grains, lean protein, water--limit salt, fat, and sugar intake) and increase physical activity as tolerated.  Next appointment: Please schedule your next Medicare Wellness Visit with your Nurse Health Advisor in 1 year by calling [phone].   Preventive Care 3 Years and Older, Female Preventive care refers to lifestyle choices and visits with your  health care provider that can promote health and wellness. What does preventive care include?  A yearly physical exam. This is also called an annual well check.  Dental exams once or twice a year.  Routine eye exams. Ask your health care provider how often you should have your eyes checked.  Personal lifestyle choices, including:  Daily care of your teeth and gums.  Regular physical activity.  Eating a healthy diet.  Avoiding tobacco and drug use.  Limiting alcohol use.  Practicing safe sex.  Taking low-dose aspirin every day.  Taking vitamin and mineral supplements as recommended by your health care provider. What happens during an annual well check? The services and screenings done by your health care provider during your annual well check will depend on your age, overall health, lifestyle risk factors, and family history of disease. Counseling  Your health care provider may ask you questions about your:  Alcohol use.  Tobacco use.  Drug use.  Emotional well-being.  Home and relationship well-being.  Sexual activity.  Eating habits.  History of falls.  Memory and ability to understand (cognition).  Work and work Statistician.  Reproductive health. Screening  You may have the following tests or measurements:  Height, weight, and BMI.  Blood pressure.  Lipid and cholesterol levels. These may be checked every 5 years, or more frequently if you are over 54 years old.  Skin check.  Lung cancer screening. You may have this screening every year starting at age 43 if you have a 30-pack-year history of smoking and currently smoke or have quit within the past 15 years.  Fecal occult blood test (FOBT) of the stool. You may have this test every year starting at age 57.  Flexible sigmoidoscopy or colonoscopy. You may have a sigmoidoscopy  every 5 years or a colonoscopy every 10 years starting at age 66.  Hepatitis C blood test.  Hepatitis B blood  test.  Sexually transmitted disease (STD) testing.  Diabetes screening. This is done by checking your blood sugar (glucose) after you have not eaten for a while (fasting). You may have this done every 1-3 years.  Bone density scan. This is done to screen for osteoporosis. You may have this done starting at age 5.  Mammogram. This may be done every 1-2 years. Talk to your health care provider about how often you should have regular mammograms. Talk with your health care provider about your test results, treatment options, and if necessary, the need for more tests. Vaccines  Your health care provider may recommend certain vaccines, such as:  Influenza vaccine. This is recommended every year.  Tetanus, diphtheria, and acellular pertussis (Tdap, Td) vaccine. You may need a Td booster every 10 years.  Zoster vaccine. You may need this after age 32.  Pneumococcal 13-valent conjugate (PCV13) vaccine. One dose is recommended after age 58.  Pneumococcal polysaccharide (PPSV23) vaccine. One dose is recommended after age 8. Talk to your health care provider about which screenings and vaccines you need and how often you need them. This information is not intended to replace advice given to you by your health care provider. Make sure you discuss any questions you have with your health care provider. Document Released: 05/05/2015 Document Revised: 12/27/2015 Document Reviewed: 02/07/2015 Elsevier Interactive Patient Education  2017 [employer] Prevention in the Home Falls can cause injuries. They can happen to people of all ages. There are many things you can do to make your home safe and to help prevent falls. What can I do on the outside of my home?  Regularly fix the edges of walkways and driveways and fix any cracks.  Remove anything that might make you trip as you walk through a door, such as a raised step or threshold.  Trim any bushes or trees on the path to your home.  Use  bright outdoor lighting.  Clear any walking paths of anything that might make someone trip, such as rocks or tools.  Regularly check to see if handrails are loose or broken. Make sure that both sides of any steps have handrails.  Any raised decks and porches should have guardrails on the edges.  Have any leaves, snow, or ice cleared regularly.  Use sand or salt on walking paths during winter.  Clean up any spills in your garage right away. This includes oil or grease spills. What can I do in the bathroom?  Use night lights.  Install grab bars by the toilet and in the tub and shower. Do not use towel bars as grab bars.  Use non-skid mats or decals in the tub or shower.  If you need to sit down in the shower, use a plastic, non-slip stool.  Keep the floor dry. Clean up any water that spills on the floor as soon as it happens.  Remove soap buildup in the tub or shower regularly.  Attach bath mats securely with double-sided non-slip rug tape.  Do not have throw rugs and other things on the floor that can make you trip. What can I do in the bedroom?  Use night lights.  Make sure that you have a light by your bed that is easy to reach.  Do not use any sheets or blankets that are too big for your bed. They should  not hang down onto the floor.  Have a firm chair that has side arms. You can use this for support while you get dressed.  Do not have throw rugs and other things on the floor that can make you trip. What can I do in the kitchen?  Clean up any spills right away.  Avoid walking on wet floors.  Keep items that you use a lot in easy-to-reach places.  If you need to reach something above you, use a strong step stool that has a grab bar.  Keep electrical cords out of the way.  Do not use floor polish or wax that makes floors slippery. If you must use wax, use non-skid floor wax.  Do not have throw rugs and other things on the floor that can make you trip. What can  I do with my stairs?  Do not leave any items on the stairs.  Make sure that there are handrails on both sides of the stairs and use them. Fix handrails that are broken or loose. Make sure that handrails are as long as the stairways.  Check any carpeting to make sure that it is firmly attached to the stairs. Fix any carpet that is loose or worn.  Avoid having throw rugs at the top or bottom of the stairs. If you do have throw rugs, attach them to the floor with carpet tape.  Make sure that you have a light switch at the top of the stairs and the bottom of the stairs. If you do not have them, ask someone to add them for you. What else can I do to help prevent falls?  Wear shoes that:  Do not have high heels.  Have rubber bottoms.  Are comfortable and fit you well.  Are closed at the toe. Do not wear sandals.  If you use a stepladder:  Make sure that it is fully opened. Do not climb a closed stepladder.  Make sure that both sides of the stepladder are locked into place.  Ask someone to hold it for you, if possible.  Clearly mark and make sure that you can see:  Any grab bars or handrails.  First and last steps.  Where the edge of each step is.  Use tools that help you move around (mobility aids) if they are needed. These include:  Canes.  Walkers.  Scooters.  Crutches.  Turn on the lights when you go into a dark area. Replace any light bulbs as soon as they burn out.  Set up your furniture so you have a clear path. Avoid moving your furniture around.  If any of your floors are uneven, fix them.  If there are any pets around you, be aware of where they are.  Review your medicines with your doctor. Some medicines can make you feel dizzy. This can increase your chance of falling. Ask your doctor what other things that you can do to help prevent falls. This information is not intended to replace advice given to you by your health care provider. Make sure you  discuss any questions you have with your health care provider. Document Released: 02/02/2009 Document Revised: 09/14/2015 Document Reviewed: 05/13/2014 Elsevier Interactive Patient Education  2017 [employer].

## 2020-04-04 ENCOUNTER — Telehealth: Payer: Self-pay

## 2020-04-04 ENCOUNTER — Telehealth: Payer: Self-pay | Admitting: Pharmacist

## 2020-04-04 ENCOUNTER — Other Ambulatory Visit: Payer: Self-pay | Admitting: Internal Medicine

## 2020-04-04 MED ORDER — AMLODIPINE BESYLATE 2.5 MG PO TABS: 2.5000 mg | ORAL_TABLET | Freq: Every day | ORAL | 3 refills | Status: DC

## 2020-04-04 MED ORDER — ZOLPIDEM TARTRATE 5 MG PO TABS: ORAL_TABLET | ORAL | 5 refills | Status: DC

## 2020-04-04 NOTE — Progress Notes (Addendum)
Chronic Care Management Pharmacy Assistant   Name: Sheila Morales  MRN: [id number] DOB: December 16, 1950  Reason for Encounter: Medication Review   PCP : Hoyt Koch, MD  Allergies:  No Known Allergies  Medications: Outpatient Encounter Medications as of 04/04/2020  Medication Sig   albuterol (VENTOLIN HFA) 108 (90 Base) MCG/ACT inhaler Inhale 1-2 puffs into the lungs every 6 (six) hours as needed for wheezing or shortness of breath.   amLODipine (NORVASC) 2.5 MG tablet Take 1 tablet (2.5 mg total) by mouth daily.   aspirin 81 MG tablet Take 81 mg by mouth daily.   buPROPion (WELLBUTRIN SR) 150 MG 12 hr tablet Take 1 tab daily X3 days, then increase to 1 tab twice daily.   calcium carbonate (OS-CAL - DOSED IN MG OF ELEMENTAL CALCIUM) 1250 (500 Ca) MG tablet Take 1 tablet by mouth.   cholecalciferol (VITAMIN D) 1000 UNITS tablet Take 1,000 Units by mouth daily.     dicyclomine (BENTYL) 10 MG capsule Take 1 capsule (10 mg total) by mouth 4 (four) times daily -  before meals and at bedtime.   doxazosin (CARDURA) 1 MG tablet TAKE ONE TABLET BY MOUTH ONCE DAILY   folic acid (FOLVITE) 1 MG tablet Take 1 mg by mouth daily.   gabapentin (NEURONTIN) 300 MG capsule TAKE TWO CAPSULES BY MOUTH EVERY MORNING, TWO capsules each afternoon AND THREE CAPSULES AT bedtime   hydroxychloroquine (PLAQUENIL) 200 MG tablet Take 200 mg by mouth 2 (two) times daily.   hydrOXYzine (ATARAX/VISTARIL) 10 MG tablet Take 1 tablet (10 mg total) by mouth 3 (three) times daily as needed.   Magnesium-Potassium 250-100 MG TABS Take by mouth.   methotrexate 2.5 MG tablet Take 10 mg by mouth once a week. Thursdays   MULTIPLE VITAMIN PO Take 1 tablet by mouth daily.     Omega-3 1000 MG CAPS Take 1,000 mg by mouth daily.   RESTASIS 0.05 % ophthalmic emulsion INSTILL 1 DROP INTO BOTH EYES TWICE A DAY   saccharomyces boulardii (FLORASTOR) 250 MG capsule Take 250 mg by mouth 2 (two) times daily.   telmisartan (MICARDIS)  40 MG tablet Take 1 tablet (40 mg total) by mouth in the morning and at bedtime.   triamcinolone cream (KENALOG) 0.1 % Apply 1 application topically 2 (two) times daily.   triamcinolone ointment (KENALOG) 0.5 % Apply 1 application topically 2 (two) times daily.   umeclidinium-vilanterol (ANORO ELLIPTA) 62.5-25 MCG/INH AEPB INHALE ONE PUFF BY MOUTH ONCE DAILY   vitamin C (ASCORBIC ACID) 500 MG tablet Take 500 mg by mouth daily.     vitamin E 1000 UNIT capsule Take 1,000 Units by mouth daily.   zolpidem (AMBIEN) 5 MG tablet TAKE ONE TABLET BY MOUTH EVERYDAY AT BEDTIME AS NEEDED   No facility-administered encounter medications on file as of 04/04/2020.    Current Diagnosis: Patient Active Problem List   Diagnosis Date Noted   Pain in right lower leg 02/18/2020   Episodic lightheadedness 02/02/2020   Hypertension 02/02/2020   LLQ pain 11/23/2019   Cyst of buttocks 10/05/2019   Methotrexate, long term, current use 07/29/2019   BPPV (benign paroxysmal positional vertigo) 12/16/2017   Insomnia 02/24/2017   COPD (chronic obstructive pulmonary disease) ([place]) 01/22/2017   Routine general medical examination at a health care facility 02/19/2016   Hereditary and idiopathic peripheral neuropathy 02/17/2015   Smokers' cough ([place]) 12/07/2014   Systemic lupus erythematosus ([place]) 10/13/2013   Cough 01/18/2009   Rash 05/31/2008  Irritable bowel syndrome 03/25/2008   RHEUMATIC FEVER, HX OF 01/17/2007    Goals Addressed   None     Follow-Up:  [facility]   Received call from patient regarding medication management via [facility].  Patient requested an acute fill forTelmisartan 40 mg, amlodipine 2.5 mg, and zolpidem 5 mg  to be delivered: 04/05/2020 Pharmacy needs refills? No  Confirmed delivery date of 04/05/2020, advised patient that pharmacy will contact them the morning of delivery.    Wendy Poet, Clinical Pharmacist Assistant [facility]

## 2020-04-04 NOTE — Telephone Encounter (Signed)
-----   Message from Hoyt Koch, MD sent at 04/04/2020  1:35 PM EST ----- Regarding: RE: Refills Have sent in amlodipine and zolpidem but we do not prescribe the methotrexate so let patient know to get in touch with the prescriber for that. Thanks ----- Message ----- From: Sheral Flow, LPN Sent: [id number]/[id number]  12:58 PM EST To: Hoyt Koch, MD Subject: Refills                                        Patient would like to get reflls on the following medication to be sent to [facility]: 1. Methotrexate 2. Amlodipine 3. Zolpidem Tartrate  Sheila Morales

## 2020-04-10 ENCOUNTER — Telehealth: Payer: Self-pay | Admitting: Internal Medicine

## 2020-04-10 ENCOUNTER — Telehealth: Payer: Self-pay

## 2020-04-10 NOTE — Telephone Encounter (Signed)
Sheila Morales is calling stating she has been recording her BP readings since she started wearing her heart monitor per Dr. Delphina Cahill request. She is wanting to know if Dr. [name] is wanting her to keep recording her readings until her upcoming appointment on 04/25/19 or if she can stop prior. Please advise.

## 2020-04-10 NOTE — Telephone Encounter (Signed)
-----   Message from Hoyt Koch, MD sent at 04/04/2020  1:35 PM EST ----- Regarding: RE: Refills Have sent in amlodipine and zolpidem but we do not prescribe the methotrexate so let patient know to get in touch with the prescriber for that. Thanks ----- Message ----- From: Sheral Flow, LPN Sent: 03/79/5583  12:58 PM EST To: Hoyt Koch, MD Subject: Refills                                        Patient would like to get reflls on the following medication to be sent to [facility]: 1. Methotrexate 2. Amlodipine 3. Zolpidem Tartrate  Sheila Morales

## 2020-04-10 NOTE — Telephone Encounter (Signed)
Left messge for patient to call office regarding message from Dr. Sharlet Salina.

## 2020-04-10 NOTE — Telephone Encounter (Signed)
Spoke with patient who states that she has been taking her blood pressure 3x daily since she last saw Dr. [name] 10/25. Patient states that her blood pressure has ran from 1 teens to 100'P systolic with the highest reading being 496 systolic. Patient states today her blood pressure is 128/58 and her heart rate is 44. Patient states her heart rate has ran from 38-61 where she states she only had a reading of 38 for 1 occurance. Patient denies any dizziness, lightheadedness, chest pain or any other symptoms and states that she feels perfectly fine. Patient reports that she has sent her heart 30 day monitor back. Patient would like to know if she should continue checking her blood pressure 3x daily. Advised patient I would forward message to Dr. [name] to review. Patient verbalized understanding.

## 2020-04-12 ENCOUNTER — Telehealth: Payer: Self-pay | Admitting: Pharmacist

## 2020-04-12 NOTE — Progress Notes (Signed)
Chronic Care Management Pharmacy Assistant   Name: Sheila Morales  MRN: [id number] DOB: 1950/09/15  Reason for Encounter: Medication Review-Acute Fill   Patient Questions:  1.  Have you seen any other providers since your last visit? No  2.  Any changes in your medicines or health? No   PCP : Hoyt Koch, MD  Allergies:  No Known Allergies  Medications: Outpatient Encounter Medications as of 04/12/2020  Medication Sig  . albuterol (VENTOLIN HFA) 108 (90 Base) MCG/ACT inhaler Inhale 1-2 puffs into the lungs every 6 (six) hours as needed for wheezing or shortness of breath.  Marland Kitchen amLODipine (NORVASC) 2.5 MG tablet Take 1 tablet (2.5 mg total) by mouth daily.  Marland Kitchen aspirin 81 MG tablet Take 81 mg by mouth daily.  Marland Kitchen buPROPion (WELLBUTRIN SR) 150 MG 12 hr tablet Take 1 tab daily X3 days, then increase to 1 tab twice daily.  . calcium carbonate (OS-CAL - DOSED IN MG OF ELEMENTAL CALCIUM) 1250 (500 Ca) MG tablet Take 1 tablet by mouth.  . cholecalciferol (VITAMIN D) 1000 UNITS tablet Take 1,000 Units by mouth daily.    Marland Kitchen dicyclomine (BENTYL) 10 MG capsule Take 1 capsule (10 mg total) by mouth 4 (four) times daily -  before meals and at bedtime.  Marland Kitchen doxazosin (CARDURA) 1 MG tablet TAKE ONE TABLET BY MOUTH ONCE DAILY  . folic acid (FOLVITE) 1 MG tablet Take 1 mg by mouth daily.  Marland Kitchen gabapentin (NEURONTIN) 300 MG capsule TAKE TWO CAPSULES BY MOUTH EVERY MORNING, TWO capsules each afternoon AND THREE CAPSULES AT bedtime  . hydroxychloroquine (PLAQUENIL) 200 MG tablet Take 200 mg by mouth 2 (two) times daily.  . hydrOXYzine (ATARAX/VISTARIL) 10 MG tablet Take 1 tablet (10 mg total) by mouth 3 (three) times daily as needed.  . Magnesium-Potassium 250-100 MG TABS Take by mouth.  . methotrexate 2.5 MG tablet Take 10 mg by mouth once a week. Thursdays  . MULTIPLE VITAMIN PO Take 1 tablet by mouth daily.    . Omega-3 1000 MG CAPS Take 1,000 mg by mouth daily.  . RESTASIS 0.05 % ophthalmic  emulsion INSTILL 1 DROP INTO BOTH EYES TWICE A DAY  . saccharomyces boulardii (FLORASTOR) 250 MG capsule Take 250 mg by mouth 2 (two) times daily.  Marland Kitchen telmisartan (MICARDIS) 40 MG tablet Take 1 tablet (40 mg total) by mouth in the morning and at bedtime.  . triamcinolone cream (KENALOG) 0.1 % Apply 1 application topically 2 (two) times daily.  Marland Kitchen triamcinolone ointment (KENALOG) 0.5 % Apply 1 application topically 2 (two) times daily.  Marland Kitchen umeclidinium-vilanterol (ANORO ELLIPTA) 62.5-25 MCG/INH AEPB INHALE ONE PUFF BY MOUTH ONCE DAILY  . vitamin C (ASCORBIC ACID) 500 MG tablet Take 500 mg by mouth daily.    . vitamin E 1000 UNIT capsule Take 1,000 Units by mouth daily.  Marland Kitchen zolpidem (AMBIEN) 5 MG tablet TAKE ONE TABLET BY MOUTH EVERYDAY AT BEDTIME AS NEEDED   No facility-administered encounter medications on file as of 04/12/2020.    Current Diagnosis: Patient Active Problem List   Diagnosis Date Noted  . Pain in right lower leg 02/18/2020  . Episodic lightheadedness 02/02/2020  . Hypertension 02/02/2020  . LLQ pain 11/23/2019  . Cyst of buttocks 10/05/2019  . Methotrexate, long term, current use 07/29/2019  . BPPV (benign paroxysmal positional vertigo) 12/16/2017  . Insomnia 02/24/2017  . COPD (chronic obstructive pulmonary disease) ([place]) 01/22/2017  . Routine general medical examination at a health care facility 02/19/2016  .  Hereditary and idiopathic peripheral neuropathy 02/17/2015  . Smokers' cough ([place]) 12/07/2014  . Systemic lupus erythematosus ([place]) 10/13/2013  . Cough 01/18/2009  . Rash 05/31/2008  . Irritable bowel syndrome 03/25/2008  . RHEUMATIC FEVER, HX OF 01/17/2007    Goals Addressed   None     Follow-Up:  Pharmacist Review   Received call from patient regarding medication management via [facility].  Patient requested an acute fill for Anoro Ellipt Aer 62.5-25, Methotrexate 2.5mg  4 tablets weekly -Thursday to be delivered: 04/17/2020 Pharmacy needs  refills? No  Confirmed delivery date of 04/17/2020, advised patient that pharmacy will contact them the morning of delivery.  Rosendo Gros, [facility]  Practice Team Manager/ CPA (Clinical Pharmacist Assistant) [phone]

## 2020-04-17 DIAGNOSIS — M5417 Radiculopathy, lumbosacral region: Secondary | ICD-10-CM | POA: Diagnosis not present

## 2020-04-17 DIAGNOSIS — M531 Cervicobrachial syndrome: Secondary | ICD-10-CM | POA: Diagnosis not present

## 2020-04-17 DIAGNOSIS — M5414 Radiculopathy, thoracic region: Secondary | ICD-10-CM | POA: Diagnosis not present

## 2020-04-17 DIAGNOSIS — M9902 Segmental and somatic dysfunction of thoracic region: Secondary | ICD-10-CM | POA: Diagnosis not present

## 2020-04-17 DIAGNOSIS — M9903 Segmental and somatic dysfunction of lumbar region: Secondary | ICD-10-CM | POA: Diagnosis not present

## 2020-04-17 DIAGNOSIS — M9901 Segmental and somatic dysfunction of cervical region: Secondary | ICD-10-CM | POA: Diagnosis not present

## 2020-04-24 ENCOUNTER — Encounter: Payer: Self-pay | Admitting: Internal Medicine

## 2020-04-24 ENCOUNTER — Ambulatory Visit: Payer: PPO | Admitting: Internal Medicine

## 2020-04-24 ENCOUNTER — Other Ambulatory Visit: Payer: Self-pay

## 2020-04-24 VITALS — BP 124/54 | HR 48 | Ht 62.0 in | Wt 131.4 lb

## 2020-04-24 DIAGNOSIS — R5383 Other fatigue: Secondary | ICD-10-CM | POA: Diagnosis not present

## 2020-04-24 DIAGNOSIS — I10 Essential (primary) hypertension: Secondary | ICD-10-CM

## 2020-04-24 DIAGNOSIS — R001 Bradycardia, unspecified: Secondary | ICD-10-CM | POA: Diagnosis not present

## 2020-04-24 DIAGNOSIS — R42 Dizziness and giddiness: Secondary | ICD-10-CM | POA: Diagnosis not present

## 2020-04-24 DIAGNOSIS — R0683 Snoring: Secondary | ICD-10-CM | POA: Diagnosis not present

## 2020-04-24 NOTE — Progress Notes (Signed)
Cardiology Office Note:    Date:  04/24/2020   ID:  Sheila Morales, DOB [birth year], MRN [id number]  PCP:  Hoyt Koch, MD  Cardiologist:  No primary care provider on file.  Electrophysiologist:  None   Referring MD: Hoyt Koch, *   Chief Complaint/Reason for Referral: Bradycardia, dizziness  History of Present Illness:    Sheila Morales is a 70 y.o. female with a history of COPD, SLE on methorexate, BPPV, and HTN who presents for follow up of dizziness and bradycardia.  Her husband [name] joins her for the visit today.  We reviewed monitor results which show frequent pauses of 3 seconds or greater, sinus bradycardia and junctional bradycardia.  She denies having symptoms during the duration of the monitor but does endorse daily fatigue.  She has also noted symptoms of lightheadedness in the last several months as well as an episode of syncope 3 to 4 years ago which may have been positional.  She does have dizziness.  She has had bradycardia demonstrated on serial ECGs at both our office and PCP.  With regard to fatigue she snores per husband's report "at least 5 out of 7 days of the week".  She notes she can fall asleep in a chair at 5 PM but then when she tries to go to sleep at night she will have difficulty and will wake up several times at night.  Unclear whether this is prompted by apneic episodes.  We discussed indications for permanent pacemaker in detail today particularly symptomatic bradycardia.  I have reviewed procedural details to the best of my ability, indications for dual-chamber sensing and pacing, and anatomic considerations including subpectoral implant and leadless pacing.  I have encouraged her and her husband to discuss this further with the electrophysiologist and I defer to their expertise.  She has been managing her hypertension with her primary care physician and has had some labile blood pressures.  Blood pressures on her log range anywhere from  94/50 which she notes was asymptomatic with a pulse of 44, up to 0000000 and [id number] systolic.  Current regimen includes telmisartan 40 mg twice daily (unchanged total dose).  She has not increased the dose of her amlodipine and remains at amlodipine 2.5 mg daily.  Doxazosin 1 mg daily has been added to her regimen.  We discussed maintaining this regimen for now and discussing with electrophysiology indications for pacing.  If she receives a pacemaker, it may be slightly easier to adjust medication therapy without concern for dizziness and lightheadedness from bradycardia.  Past Medical History:  Diagnosis Date  . Acute respiratory failure with hypoxia ([place]) 11/05/2017  . Arthritis   . Colitis, ischemic ([place])   . External hemorrhoids   . H/O: rheumatic fever   . IBS (irritable bowel syndrome)   . Personal history colonic adenoma 03/25/2008   06/2007 right sided adenoma and 2 right hyperplastic polyps    . Restless leg syndrome    questionable  . Systemic lupus erythematosus ([place])   . Tubular adenoma of colon   . 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 CHOLECYSTECTOMY    . CHOLECYSTECTOMY, LAPAROSCOPIC    . COLONOSCOPY    . ESOPHAGOGASTRODUODENOSCOPY    . lesion, vulva excision    . TONSILLECTOMY    . VIDEO BRONCHOSCOPY Bilateral 11/06/2017   Procedure: VIDEO BRONCHOSCOPY WITHOUT FLUORO;  Surgeon: Marshell Garfinkel, MD;  Location: [facility];  Service: Cardiopulmonary;  Laterality: Bilateral;    Current Medications: Current Meds  Medication Sig  . albuterol (VENTOLIN HFA) 108 (90 Base) MCG/ACT inhaler Inhale 1-2 puffs into the lungs every 6 (six) hours as needed for wheezing or shortness of breath.  Marland Kitchen amLODipine (NORVASC) 2.5 MG tablet Take 1 tablet (2.5 mg total) by mouth daily.  Marland Kitchen aspirin 81 MG tablet Take 81 mg by mouth daily.  . calcium carbonate (OS-CAL - DOSED IN MG OF ELEMENTAL CALCIUM) 1250 (500 Ca) MG tablet Take 1 tablet by  mouth.  . cholecalciferol (VITAMIN D) 1000 UNITS tablet Take 1,000 Units by mouth daily.  Marland Kitchen dicyclomine (BENTYL) 10 MG capsule Take 1 capsule (10 mg total) by mouth 4 (four) times daily -  before meals and at bedtime.  Marland Kitchen doxazosin (CARDURA) 1 MG tablet TAKE ONE TABLET BY MOUTH ONCE DAILY  . folic acid (FOLVITE) 1 MG tablet Take 1 mg by mouth daily.  Marland Kitchen gabapentin (NEURONTIN) 300 MG capsule TAKE TWO CAPSULES BY MOUTH EVERY MORNING, TWO capsules each afternoon AND THREE CAPSULES AT bedtime  . hydroxychloroquine (PLAQUENIL) 200 MG tablet Take 200 mg by mouth 2 (two) times daily.  . hydrOXYzine (ATARAX/VISTARIL) 10 MG tablet Take 1 tablet (10 mg total) by mouth 3 (three) times daily as needed.  . methotrexate 2.5 MG tablet Take 10 mg by mouth once a week. Thursdays  . MULTIPLE VITAMIN PO Take 1 tablet by mouth daily.  . Omega-3 1000 MG CAPS Take 1,000 mg by mouth daily.  . RESTASIS 0.05 % ophthalmic emulsion INSTILL 1 DROP INTO BOTH EYES TWICE A DAY  . saccharomyces boulardii (FLORASTOR) 250 MG capsule Take 250 mg by mouth 2 (two) times daily.  Marland Kitchen telmisartan (MICARDIS) 40 MG tablet Take 1 tablet (40 mg total) by mouth in the morning and at bedtime.  . triamcinolone cream (KENALOG) 0.1 % Apply 1 application topically 2 (two) times daily.  Marland Kitchen triamcinolone ointment (KENALOG) 0.5 % Apply 1 application topically 2 (two) times daily.  Marland Kitchen umeclidinium-vilanterol (ANORO ELLIPTA) 62.5-25 MCG/INH AEPB INHALE ONE PUFF BY MOUTH ONCE DAILY  . vitamin C (ASCORBIC ACID) 500 MG tablet Take 500 mg by mouth daily.  . vitamin E 1000 UNIT capsule Take 1,000 Units by mouth daily.  Marland Kitchen zolpidem (AMBIEN) 5 MG tablet TAKE ONE TABLET BY MOUTH EVERYDAY AT BEDTIME AS NEEDED     Allergies:   Patient has no known allergies.   Social History   Tobacco Use  . Smoking status: Former Smoker    Packs/day: 0.25    Years: 30.00    Pack years: 7.50    Types: Cigarettes    Quit date: 01/21/2020    Years since quitting: 0.2  .  Smokeless tobacco: Never Used  . Tobacco comment: Declined info  Vaping Use  . Vaping Use: Never used  Substance Use Topics  . Alcohol use: Yes    Alcohol/week: 5.0 standard drinks    Types: 5 Standard drinks or equivalent per week    Comment: 2 glasses of wine nightly, 4 beers nightly on weekend  . Drug use: No     Family History: The patient's family history includes Atrial fibrillation in her father; Colon cancer in her father; Healthy in her daughter; Other in her sister; Stroke in her mother.  ROS:   Please see the history of present illness.    All other systems reviewed and are negative.  EKGs/Labs/Other Studies Reviewed:    The following studies were reviewed today:  EKG:  Sinus bradycardia rate 48 bpm, short PR interval (108 ms)  Recent Labs: 01/21/2020: ALT 29; BUN 13; Creatinine, Ser 0.89; Hemoglobin 13.1; Platelets 206.0; Potassium 4.3; Sodium 128  Recent Lipid Panel    Component Value Date/Time   CHOL 148 03/29/2019 0909   TRIG 66.0 03/29/2019 0909   HDL 63.60 03/29/2019 0909   CHOLHDL 2 03/29/2019 0909   VLDL 13.2 03/29/2019 0909   LDLCALC 71 03/29/2019 0909   LDLDIRECT 52.0 02/24/2017 1351    Physical Exam:    VS:  BP (!) 124/54   Pulse (!) 48   Ht 5\' 2"  (1.575 m)   Wt 131 lb 6.4 oz (59.6 kg)   BMI 24.03 kg/m     Wt Readings from Last 5 Encounters:  04/24/20 131 lb 6.4 oz (59.6 kg)  04/03/20 132 lb 3.2 oz (60 kg)  02/24/20 126 lb (57.2 kg)  02/18/20 124 lb (56.2 kg)  02/14/20 124 lb 12.8 oz (56.6 kg)    Constitutional: No acute distress Eyes: sclera non-icteric, normal conjunctiva and lids ENMT: normal dentition, moist mucous membranes Cardiovascular: regular rhythm, bradycardic rate, no murmurs. S1 and S2 normal. Radial pulses normal bilaterally. No jugular venous distention.  Respiratory: clear to auscultation bilaterally GI : normal bowel sounds, soft and nontender. No distention.   MSK: extremities warm, well perfused. No edema.  NEURO:  grossly nonfocal exam, moves all extremities. PSYCH: alert and oriented x 3, normal mood and affect.   ASSESSMENT:    1. Bradycardia   2. Dizziness   3. Snoring   4. Primary hypertension   5. Fatigue, unspecified type    PLAN:    Bradycardia - Plan: Ambulatory referral to Cardiac Electrophysiology Dizziness  -We will place a referral for EP for consideration of permanent pacemaker based on monitor results.  Normal LVEF with grade 2 diastolic dysfunction and normal global longitudinal strain.   Snoring - Plan: Split night study -PACs noted on her monitor, as well as excessive daytime fatigue and snoring.  Plan for split-night study.  Her bradycardia and junctional rhythm do occur during the daytime as well, so while treating her sleep apnea may help, I do not feel that it is the sole driver of her rhythm issues.  Mildly elevated pulmonary artery systolic pressure on echo, 39 mmHg.  Primary hypertension -Continue amlodipine 2.5 mg daily, telmisartan 40 mg twice daily, doxazosin 1 mg daily.  Blood pressures are grossly well controlled with some hypotension, will make further adjustments at follow-up.  Fatigue, unspecified type - sleep study and EP referral. Will evaluate afterward, if persistent despite correction of reversible factors, will consider review of medications with pharmacist.  Total time of encounter: 45 minutes total time of encounter, including 30 minutes spent in face-to-face patient care on the date of this encounter. This time includes coordination of care and counseling regarding above mentioned problem list. Remainder of non-face-to-face time involved reviewing chart documents/testing relevant to the patient encounter and documentation in the medical record. I have independently reviewed documentation from referring provider.   Cherlynn Kaiser, MD [place]  [facility]    Medication Adjustments/Labs and Tests Ordered: Current medicines are reviewed at length  with the patient today.  Concerns regarding medicines are outlined above.   Orders Placed This Encounter  Procedures  . Ambulatory referral to Cardiac Electrophysiology  . Split night study    No orders of the defined types were placed in this encounter.   Patient Instructions  Medication Instructions:  No  Changes In Medications at this time.  *If you need a refill on your cardiac medications before your next appointment, please call your pharmacy*  Testing/Procedures: Your physician has recommended that you have a sleep study. This test records several body functions during sleep, including: brain activity, eye movement, oxygen and carbon dioxide blood levels, heart rate and rhythm, breathing rate and rhythm, the flow of air through your mouth and nose, snoring, body muscle movements, and chest and belly movement. SOMEONE WILL CONTACT YOU TO SCHEDULE THIS.   Follow-Up: At [facility], you and your health needs are our priority.  As part of our continuing mission to provide you with exceptional heart care, we have created designated Provider Care Teams.  These Care Teams include your primary Cardiologist (physician) and Advanced Practice Providers (APPs -  Physician Assistants and Nurse Practitioners) who all work together to provide you with the care you need, when you need it.  Your next appointment:   2 month(s)  The format for your next appointment:   In Person  Provider:   Cherlynn Kaiser, MD  Other Instructions YOU ARE [place]. SOMEONE WILL REACH OUT TO YOU TO GET THIS SCHEDULED.

## 2020-04-24 NOTE — Patient Instructions (Signed)
Medication Instructions:  No Changes In Medications at this time.  *If you need a refill on your cardiac medications before your next appointment, please call your pharmacy*  Testing/Procedures: Your physician has recommended that you have a sleep study. This test records several body functions during sleep, including: brain activity, eye movement, oxygen and carbon dioxide blood levels, heart rate and rhythm, breathing rate and rhythm, the flow of air through your mouth and nose, snoring, body muscle movements, and chest and belly movement. SOMEONE WILL CONTACT YOU TO SCHEDULE THIS.   Follow-Up: At [facility], you and your health needs are our priority.  As part of our continuing mission to provide you with exceptional heart care, we have created designated Provider Care Teams.  These Care Teams include your primary Cardiologist (physician) and Advanced Practice Providers (APPs -  Physician Assistants and Nurse Practitioners) who all work together to provide you with the care you need, when you need it.  Your next appointment:   2 month(s)  The format for your next appointment:   In Person  Provider:   Weston Brass, MD  Other Instructions YOU ARE BEING REFERRED TO CARDIAC ELECTROPHYSIOLOGY. SOMEONE WILL REACH OUT TO YOU TO GET THIS SCHEDULED.

## 2020-04-24 NOTE — Addendum Note (Signed)
Addended by: Bea Laura B on: 04/24/2020 01:48 PM   Modules accepted: Orders

## 2020-04-25 ENCOUNTER — Other Ambulatory Visit: Payer: Self-pay | Admitting: Internal Medicine

## 2020-04-25 NOTE — Telephone Encounter (Signed)
Please advise in PCP's absence. Thanks!  [place] Controlled Database Checked Last filled:  04/05/20  # 30 LOV:  02/18/20  Next appt:  none

## 2020-04-27 ENCOUNTER — Telehealth: Payer: Self-pay | Admitting: Pharmacist

## 2020-04-28 NOTE — Progress Notes (Signed)
Chronic Care Management Pharmacy Assistant   Name: Sheila Morales  MRN: [id number] DOB: [birth year]  Reason for Encounter: Medication Review  Patient Questions:  1.  Have you seen any other providers since your last visit? Yes, patient last seen Dr. [name], Cardiologist on 04/24/20  2.  Any changes in your medicines or health? No    PCP : Hoyt Koch, MD  Allergies:  No Known Allergies  Medications: Outpatient Encounter Medications as of 04/27/2020  Medication Sig   albuterol (VENTOLIN HFA) 108 (90 Base) MCG/ACT inhaler Inhale 1-2 puffs into the lungs every 6 (six) hours as needed for wheezing or shortness of breath.   amLODipine (NORVASC) 2.5 MG tablet Take 1 tablet (2.5 mg total) by mouth daily.   aspirin 81 MG tablet Take 81 mg by mouth daily.   calcium carbonate (OS-CAL - DOSED IN MG OF ELEMENTAL CALCIUM) 1250 (500 Ca) MG tablet Take 1 tablet by mouth.   cholecalciferol (VITAMIN D) 1000 UNITS tablet Take 1,000 Units by mouth daily.   dicyclomine (BENTYL) 10 MG capsule Take 1 capsule (10 mg total) by mouth 4 (four) times daily -  before meals and at bedtime.   doxazosin (CARDURA) 1 MG tablet TAKE ONE TABLET BY MOUTH ONCE DAILY   folic acid (FOLVITE) 1 MG tablet Take 1 mg by mouth daily.   gabapentin (NEURONTIN) 300 MG capsule TAKE TWO CAPSULES BY MOUTH EVERY MORNING, TWO capsules each afternoon AND THREE CAPSULES AT bedtime   hydroxychloroquine (PLAQUENIL) 200 MG tablet Take 200 mg by mouth 2 (two) times daily.   hydrOXYzine (ATARAX/VISTARIL) 10 MG tablet Take 1 tablet (10 mg total) by mouth 3 (three) times daily as needed.   methotrexate 2.5 MG tablet Take 10 mg by mouth once a week. Thursdays   MULTIPLE VITAMIN PO Take 1 tablet by mouth daily.   Omega-3 1000 MG CAPS Take 1,000 mg by mouth daily.   RESTASIS 0.05 % ophthalmic emulsion INSTILL 1 DROP INTO BOTH EYES TWICE A DAY   saccharomyces boulardii (FLORASTOR) 250 MG capsule Take 250 mg by mouth 2 (two)  times daily.   telmisartan (MICARDIS) 40 MG tablet Take 1 tablet (40 mg total) by mouth in the morning and at bedtime.   triamcinolone cream (KENALOG) 0.1 % Apply 1 application topically 2 (two) times daily.   triamcinolone ointment (KENALOG) 0.5 % Apply 1 application topically 2 (two) times daily.   umeclidinium-vilanterol (ANORO ELLIPTA) 62.5-25 MCG/INH AEPB INHALE ONE PUFF BY MOUTH ONCE DAILY   vitamin C (ASCORBIC ACID) 500 MG tablet Take 500 mg by mouth daily.   vitamin E 1000 UNIT capsule Take 1,000 Units by mouth daily.   zolpidem (AMBIEN) 5 MG tablet TAKE ONE TABLET BY MOUTH EVERYDAY AT BEDTIME AS NEEDED   No facility-administered encounter medications on file as of 04/27/2020.    Current Diagnosis: Patient Active Problem List   Diagnosis Date Noted   Pain in right lower leg 02/18/2020   Episodic lightheadedness 02/02/2020   Hypertension 02/02/2020   LLQ pain 11/23/2019   Cyst of buttocks 10/05/2019   Methotrexate, long term, current use 07/29/2019   BPPV (benign paroxysmal positional vertigo) 12/16/2017   Insomnia 02/24/2017   COPD (chronic obstructive pulmonary disease) ([place]) 01/22/2017   Routine general medical examination at a health care facility 02/19/2016   Hereditary and idiopathic peripheral neuropathy 02/17/2015   Smokers' cough ([place]) 12/07/2014   Systemic lupus erythematosus ([place]) 10/13/2013   Cough 01/18/2009   Rash 05/31/2008  Irritable bowel syndrome 03/25/2008   RHEUMATIC FEVER, HX OF 01/17/2007    Goals Addressed   None     Follow-Up:  Coordination of Enhanced Pharmacy Services   Reviewed chart for medication changes ahead of medication coordination call.  No OVs, Consults, or hospital visits since last care coordination call/Pharmacist visit.  No medication changes indicated   BP Readings from Last 3 Encounters:  04/24/20 (!) 124/54  04/03/20 124/70  02/24/20 134/67    Lab Results  Component Value Date   HGBA1C 5.7  02/24/2017     Patient obtains medications through Adherence Packaging  90 Days   Last adherence delivery included:   Zolpidem tartrate 5 mg take 1 tab by mouth everyday at bedtime 04/05/20 Telmisartan 40 mg take 2 tab by mouth daily 04/12/20 anoro ellipt aer 62.5-25 inhale 1 puff by mouth daily 04/12/20  Patient declinedlast month Amlodipine, zolpidem, albuterol, and methotrexate patient has an abundant supply on hand  due to PRN use/additional supply on hand. Explanation of abundance on hand (ie #30 due to overlapping fills or previous adherence issues etc)  Patient is due for next adherence delivery on: 05/17/2020. Called patient and reviewed medications and coordinated delivery.  This delivery to include:  Telmisartan 40 mg 2 tabs by mouth daily Anoro Ellipt Aer 62.5-25 Inhale 1 puff by mouth daily Methotrexate 2.5 mg 4 tabs by mouth at the same time once a week Zolpidem Tartrate 5 mg take 1 tab by mouth every day at bedtime as needed Amlodipine 2.5 mg 1  Tab by mouth daily Doxazosin Mesylate 1 mg 1 tab by mouth daily Hydroxychloroquine 200 mg 1 tab by mouth daily   Confirmed delivery date of 05/17/2020, advised patient that pharmacy will contact them the morning of delivery.   Wendy Poet, [place] [phone]

## 2020-05-01 ENCOUNTER — Telehealth: Payer: Self-pay | Admitting: Pharmacist

## 2020-05-01 NOTE — Progress Notes (Signed)
Chronic Care Management Pharmacy Assistant   Name: EREN PUEBLA  MRN: [id number] DOB: [birth year]  Reason for Encounter: Medication Review   PCP : Hoyt Koch, MD  Allergies:  No Known Allergies  Medications: Outpatient Encounter Medications as of 05/01/2020  Medication Sig   albuterol (VENTOLIN HFA) 108 (90 Base) MCG/ACT inhaler Inhale 1-2 puffs into the lungs every 6 (six) hours as needed for wheezing or shortness of breath.   amLODipine (NORVASC) 2.5 MG tablet Take 1 tablet (2.5 mg total) by mouth daily.   aspirin 81 MG tablet Take 81 mg by mouth daily.   calcium carbonate (OS-CAL - DOSED IN MG OF ELEMENTAL CALCIUM) 1250 (500 Ca) MG tablet Take 1 tablet by mouth.   cholecalciferol (VITAMIN D) 1000 UNITS tablet Take 1,000 Units by mouth daily.   dicyclomine (BENTYL) 10 MG capsule Take 1 capsule (10 mg total) by mouth 4 (four) times daily -  before meals and at bedtime.   doxazosin (CARDURA) 1 MG tablet TAKE ONE TABLET BY MOUTH ONCE DAILY   folic acid (FOLVITE) 1 MG tablet Take 1 mg by mouth daily.   gabapentin (NEURONTIN) 300 MG capsule TAKE TWO CAPSULES BY MOUTH EVERY MORNING, TWO capsules each afternoon AND THREE CAPSULES AT bedtime   hydroxychloroquine (PLAQUENIL) 200 MG tablet Take 200 mg by mouth 2 (two) times daily.   hydrOXYzine (ATARAX/VISTARIL) 10 MG tablet Take 1 tablet (10 mg total) by mouth 3 (three) times daily as needed.   methotrexate 2.5 MG tablet Take 10 mg by mouth once a week. Thursdays   MULTIPLE VITAMIN PO Take 1 tablet by mouth daily.   Omega-3 1000 MG CAPS Take 1,000 mg by mouth daily.   RESTASIS 0.05 % ophthalmic emulsion INSTILL 1 DROP INTO BOTH EYES TWICE A DAY   saccharomyces boulardii (FLORASTOR) 250 MG capsule Take 250 mg by mouth 2 (two) times daily.   telmisartan (MICARDIS) 40 MG tablet Take 1 tablet (40 mg total) by mouth in the morning and at bedtime.   triamcinolone cream (KENALOG) 0.1 % Apply 1 application topically 2  (two) times daily.   triamcinolone ointment (KENALOG) 0.5 % Apply 1 application topically 2 (two) times daily.   umeclidinium-vilanterol (ANORO ELLIPTA) 62.5-25 MCG/INH AEPB INHALE ONE PUFF BY MOUTH ONCE DAILY   vitamin C (ASCORBIC ACID) 500 MG tablet Take 500 mg by mouth daily.   vitamin E 1000 UNIT capsule Take 1,000 Units by mouth daily.   zolpidem (AMBIEN) 5 MG tablet TAKE ONE TABLET BY MOUTH EVERYDAY AT BEDTIME AS NEEDED   No facility-administered encounter medications on file as of 05/01/2020.    Current Diagnosis: Patient Active Problem List   Diagnosis Date Noted   Pain in right lower leg 02/18/2020   Episodic lightheadedness 02/02/2020   Hypertension 02/02/2020   LLQ pain 11/23/2019   Cyst of buttocks 10/05/2019   Methotrexate, long term, current use 07/29/2019   BPPV (benign paroxysmal positional vertigo) 12/16/2017   Insomnia 02/24/2017   COPD (chronic obstructive pulmonary disease) ([place]) 01/22/2017   Routine general medical examination at a health care facility 02/19/2016   Hereditary and idiopathic peripheral neuropathy 02/17/2015   Smokers' cough ([place]) 12/07/2014   Systemic lupus erythematosus ([place]) 10/13/2013   Cough 01/18/2009   Rash 05/31/2008   Irritable bowel syndrome 03/25/2008   RHEUMATIC FEVER, HX OF 01/17/2007    Goals Addressed   None     Follow-Up:  Coordination of Enhanced Pharmacy Services   Received call from patient regarding  medication management via [facility].  Patient requested an acute fill for Telmisartan and Doxazosin to be delivered: 05/05/2020 Pharmacy needs refills? No  Confirmed delivery date of 05/05/2020, advised patient that pharmacy will contact them the morning of delivery.  Wendy Poet, [place] [phone]

## 2020-05-03 ENCOUNTER — Telehealth: Payer: Self-pay | Admitting: Pharmacist

## 2020-05-03 NOTE — Progress Notes (Signed)
Chronic Care Management Pharmacy Assistant   Name: Sheila Morales  MRN: [id number] DOB: March 26, 1951  Reason for Encounter: Medication Review   PCP : Hoyt Koch, MD  Allergies:  No Known Allergies  Medications: Outpatient Encounter Medications as of 05/03/2020  Medication Sig  . albuterol (VENTOLIN HFA) 108 (90 Base) MCG/ACT inhaler Inhale 1-2 puffs into the lungs every 6 (six) hours as needed for wheezing or shortness of breath.  Marland Kitchen amLODipine (NORVASC) 2.5 MG tablet Take 1 tablet (2.5 mg total) by mouth daily.  Marland Kitchen aspirin 81 MG tablet Take 81 mg by mouth daily.  . calcium carbonate (OS-CAL - DOSED IN MG OF ELEMENTAL CALCIUM) 1250 (500 Ca) MG tablet Take 1 tablet by mouth.  . cholecalciferol (VITAMIN D) 1000 UNITS tablet Take 1,000 Units by mouth daily.  Marland Kitchen dicyclomine (BENTYL) 10 MG capsule Take 1 capsule (10 mg total) by mouth 4 (four) times daily -  before meals and at bedtime.  Marland Kitchen doxazosin (CARDURA) 1 MG tablet TAKE ONE TABLET BY MOUTH ONCE DAILY  . folic acid (FOLVITE) 1 MG tablet Take 1 mg by mouth daily.  Marland Kitchen gabapentin (NEURONTIN) 300 MG capsule TAKE TWO CAPSULES BY MOUTH EVERY MORNING, TWO capsules each afternoon AND THREE CAPSULES AT bedtime  . hydroxychloroquine (PLAQUENIL) 200 MG tablet Take 200 mg by mouth 2 (two) times daily.  . hydrOXYzine (ATARAX/VISTARIL) 10 MG tablet Take 1 tablet (10 mg total) by mouth 3 (three) times daily as needed.  . methotrexate 2.5 MG tablet Take 10 mg by mouth once a week. Thursdays  . MULTIPLE VITAMIN PO Take 1 tablet by mouth daily.  . Omega-3 1000 MG CAPS Take 1,000 mg by mouth daily.  . RESTASIS 0.05 % ophthalmic emulsion INSTILL 1 DROP INTO BOTH EYES TWICE A DAY  . saccharomyces boulardii (FLORASTOR) 250 MG capsule Take 250 mg by mouth 2 (two) times daily.  Marland Kitchen telmisartan (MICARDIS) 40 MG tablet Take 1 tablet (40 mg total) by mouth in the morning and at bedtime.  . triamcinolone cream (KENALOG) 0.1 % Apply 1 application topically 2  (two) times daily.  Marland Kitchen triamcinolone ointment (KENALOG) 0.5 % Apply 1 application topically 2 (two) times daily.  Marland Kitchen umeclidinium-vilanterol (ANORO ELLIPTA) 62.5-25 MCG/INH AEPB INHALE ONE PUFF BY MOUTH ONCE DAILY  . vitamin C (ASCORBIC ACID) 500 MG tablet Take 500 mg by mouth daily.  . vitamin E 1000 UNIT capsule Take 1,000 Units by mouth daily.  Marland Kitchen zolpidem (AMBIEN) 5 MG tablet TAKE ONE TABLET BY MOUTH EVERYDAY AT BEDTIME AS NEEDED   No facility-administered encounter medications on file as of 05/03/2020.    Current Diagnosis: Patient Active Problem List   Diagnosis Date Noted  . Pain in right lower leg 02/18/2020  . Episodic lightheadedness 02/02/2020  . Hypertension 02/02/2020  . LLQ pain 11/23/2019  . Cyst of buttocks 10/05/2019  . Methotrexate, long term, current use 07/29/2019  . BPPV (benign paroxysmal positional vertigo) 12/16/2017  . Insomnia 02/24/2017  . COPD (chronic obstructive pulmonary disease) ([place]) 01/22/2017  . Routine general medical examination at a health care facility 02/19/2016  . Hereditary and idiopathic peripheral neuropathy 02/17/2015  . Smokers' cough ([place]) 12/07/2014  . Systemic lupus erythematosus ([place]) 10/13/2013  . Cough 01/18/2009  . Rash 05/31/2008  . Irritable bowel syndrome 03/25/2008  . RHEUMATIC FEVER, HX OF 01/17/2007    Goals Addressed   None     Follow-Up:  Pharmacist Review   Received call from patient regarding medication management via  [facility].  Patient requested an acute fill for Zolpidem  to be delivered: 05/03/2020 Pharmacy needs refills? No  Confirmed delivery date of 05/03/2020, advised patient that pharmacy will contact them the morning of delivery.  Rosendo Gros, [facility]  Practice Team Manager/ CPA (Clinical Pharmacist Assistant) [phone]

## 2020-05-11 ENCOUNTER — Encounter: Payer: Self-pay | Admitting: Internal Medicine

## 2020-05-11 ENCOUNTER — Ambulatory Visit: Payer: PPO | Admitting: Internal Medicine

## 2020-05-11 ENCOUNTER — Telehealth: Payer: Self-pay | Admitting: *Deleted

## 2020-05-11 ENCOUNTER — Other Ambulatory Visit: Payer: Self-pay

## 2020-05-11 DIAGNOSIS — R001 Bradycardia, unspecified: Secondary | ICD-10-CM | POA: Diagnosis not present

## 2020-05-11 LAB — CBC WITH DIFFERENTIAL/PLATELET
Basophils Absolute: 0 10*3/uL (ref 0.0–0.2)
Basos: 1 %
EOS (ABSOLUTE): 0.1 10*3/uL (ref 0.0–0.4)
Eos: 2 %
Hematocrit: 35 % (ref 34.0–46.6)
Hemoglobin: 11.8 g/dL (ref 11.1–15.9)
Lymphocytes Absolute: 1.2 10*3/uL (ref 0.7–3.1)
Lymphs: 29 %
MCH: 30.9 pg (ref 26.6–33.0)
MCHC: 33.7 g/dL (ref 31.5–35.7)
MCV: 92 fL (ref 79–97)
Monocytes Absolute: 0.7 10*3/uL (ref 0.1–0.9)
Monocytes: 17 %
Neutrophils Absolute: 2.1 10*3/uL (ref 1.4–7.0)
Neutrophils: 51 %
Platelets: 153 10*3/uL (ref 150–450)
RBC: 3.82 x10E6/uL (ref 3.77–5.28)
RDW: 14.7 % (ref 11.7–15.4)
WBC: 4.2 10*3/uL (ref 3.4–10.8)

## 2020-05-11 LAB — BASIC METABOLIC PANEL
BUN/Creatinine Ratio: 26 (ref 12–28)
BUN: 20 mg/dL (ref 8–27)
CO2: 30 mmol/L — ABNORMAL HIGH (ref 20–29)
Calcium: 9.5 mg/dL (ref 8.7–10.3)
Chloride: 103 mmol/L (ref 96–106)
Creatinine, Ser: 0.78 mg/dL (ref 0.57–1.00)
GFR calc Af Amer: 89 mL/min/{1.73_m2} (ref >59)
GFR calc non Af Amer: 77 mL/min/{1.73_m2} (ref >59)
Glucose: 80 mg/dL (ref 65–99)
Potassium: 4.6 mmol/L (ref 3.5–5.2)
Sodium: 139 mmol/L (ref 134–144)

## 2020-05-11 NOTE — Telephone Encounter (Addendum)
Patient states she has received a bill from [employer] for $1600.00 for monitor, which , she states has been shipped back to the monitor company.   I asked if she was referring to her Explanation of Benefits.  Patient was not at home and did not have paperwork to verify. Advised patient, given patient had [employer] with [employer] insurance it  is unlikely this truly represents the amount due to [employer].  I recommended she contact [employer] billing department at [phone] to discuss and verify insurance information.  Perhaps her claim needed to be resubmitted.  If Sheila Morales does not receive a satisfactory resolution of her concerns she has been advised to call me back and I will in turn contact our [employer] representative. Insurance information on [employer] reviewed.  Subscriber # listed incorrect by 1 digit.  [employer] contacted with subscriber # listed on scanned in [facility] Advantage care.  Advised patients claim may need to be resubmitted.

## 2020-05-11 NOTE — Progress Notes (Signed)
HPI Sheila Morales is referred today by Dr. [name] for evaluation of symptomatic sinus node dysfunction. She has a h/o cutaneous lupus and copd. She stopped smoking in October. The patient has dizzy spells and one episode of syncope. She wore a cardiac monitor demonstrating pauses of over 3 seconds in the morning and in the daytime.   No Known Allergies   Current Outpatient Medications  Medication Sig Dispense Refill  . albuterol (VENTOLIN HFA) 108 (90 Base) MCG/ACT inhaler Inhale 1-2 puffs into the lungs every 6 (six) hours as needed for wheezing or shortness of breath. 8 g 2  . amLODipine (NORVASC) 2.5 MG tablet Take 1 tablet (2.5 mg total) by mouth daily. 90 tablet 3  . aspirin 81 MG tablet Take 81 mg by mouth daily.    . calcium carbonate (OS-CAL - DOSED IN MG OF ELEMENTAL CALCIUM) 1250 (500 Ca) MG tablet Take 1 tablet by mouth.    . cholecalciferol (VITAMIN D) 1000 UNITS tablet Take 1,000 Units by mouth daily.    Sheila Morales dicyclomine (BENTYL) 10 MG capsule Take 1 capsule (10 mg total) by mouth 4 (four) times daily -  before meals and at bedtime. 90 capsule 0  . doxazosin (CARDURA) 1 MG tablet TAKE ONE TABLET BY MOUTH ONCE DAILY 30 tablet 2  . folic acid (FOLVITE) 1 MG tablet Take 1 mg by mouth daily.    Sheila Morales gabapentin (NEURONTIN) 300 MG capsule TAKE TWO CAPSULES BY MOUTH EVERY MORNING, TWO capsules each afternoon AND THREE CAPSULES AT bedtime 630 capsule 1  . hydroxychloroquine (PLAQUENIL) 200 MG tablet Take 200 mg by mouth daily.    . hydrOXYzine (ATARAX/VISTARIL) 10 MG tablet Take 1 tablet (10 mg total) by mouth 3 (three) times daily as needed. 45 tablet 0  . methotrexate 2.5 MG tablet Take 10 mg by mouth once a week. Thursdays    . MULTIPLE VITAMIN PO Take 1 tablet by mouth daily.    . Omega-3 1000 MG CAPS Take 1,000 mg by mouth daily.    . RESTASIS 0.05 % ophthalmic emulsion INSTILL 1 DROP INTO BOTH EYES TWICE A DAY  6  . saccharomyces boulardii (FLORASTOR) 250 MG capsule Take 250 mg by  mouth 2 (two) times daily.    Sheila Morales telmisartan (MICARDIS) 40 MG tablet Take 1 tablet (40 mg total) by mouth in the morning and at bedtime. 60 tablet 5  . triamcinolone cream (KENALOG) 0.1 % Apply 1 application topically 2 (two) times daily. 30 g 0  . triamcinolone ointment (KENALOG) 0.5 % Apply 1 application topically 2 (two) times daily. 30 g 0  . umeclidinium-vilanterol (ANORO ELLIPTA) 62.5-25 MCG/INH AEPB INHALE ONE PUFF BY MOUTH ONCE DAILY 60 each 5  . vitamin C (ASCORBIC ACID) 500 MG tablet Take 500 mg by mouth daily.    . vitamin E 1000 UNIT capsule Take 1,000 Units by mouth daily.    Sheila Morales zolpidem (AMBIEN) 5 MG tablet TAKE ONE TABLET BY MOUTH EVERYDAY AT BEDTIME AS NEEDED 30 tablet 0   No current facility-administered medications for this visit.     Past Medical History:  Diagnosis Date  . Acute respiratory failure with hypoxia ([place]) 11/05/2017  . Arthritis   . Colitis, ischemic ([place])   . External hemorrhoids   . H/O: rheumatic fever   . IBS (irritable bowel syndrome)   . Personal history colonic adenoma 03/25/2008   06/2007 right sided adenoma and 2 right hyperplastic polyps    . Restless leg syndrome  questionable  . Systemic lupus erythematosus ([place])   . Tubular adenoma of colon   . 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   Past Surgical History:  Procedure Laterality Date  . CHOLECYSTECTOMY    . CHOLECYSTECTOMY, LAPAROSCOPIC    . COLONOSCOPY    . ESOPHAGOGASTRODUODENOSCOPY    . lesion, vulva excision    . TONSILLECTOMY    . VIDEO BRONCHOSCOPY Bilateral 11/06/2017   Procedure: VIDEO BRONCHOSCOPY WITHOUT FLUORO;  Surgeon: Marshell Garfinkel, MD;  Location: [facility];  Service: Cardiopulmonary;  Laterality: Bilateral;     Family History  Problem Relation Age of Onset  . Stroke Mother        Deceased, 68s  . Atrial fibrillation Father   . Colon cancer Father   . Other Sister        Pick's  disease, deceased  . Healthy Daughter      Social History   Socioeconomic History  . Marital status: Married    Spouse name: Not on file  . Number of children: 2  . Years of education: 14  . Highest education level: Not on file  Occupational History  . Occupation: Education administrator business  . Occupation: caregiver  Tobacco Use  . Smoking status: Former Smoker    Packs/day: 0.25    Years: 30.00    Pack years: 7.50    Types: Cigarettes    Quit date: 01/21/2020    Years since quitting: 0.3  . Smokeless tobacco: Never Used  . Tobacco comment: Declined info  Vaping Use  . Vaping Use: Never used  Substance and Sexual Activity  . Alcohol use: Yes    Alcohol/week: 5.0 standard drinks    Types: 5 Standard drinks or equivalent per week    Comment: 2 glasses of wine nightly, 4 beers nightly on weekend  . Drug use: No  . Sexual activity: Yes  Other Topics Concern  . Not on file  Social History Narrative   HSG. Married '71. 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       One level home with spouse   Right handed   Caffeine - coffee 2-3 cups am   Exercise - treadmill    Education - 12th grade   Social Determinants of Health   Financial Resource Strain: Low Risk   . Difficulty of Paying Living Expenses: Not hard at all  Food Insecurity: No Food Insecurity  . Worried About Charity fundraiser in the Last Year: Never true  . Ran Out of Food in the Last Year: Never true  Transportation Needs: No Transportation Needs  . Lack of Transportation (Medical): No  . Lack of Transportation (Non-Medical): No  Physical Activity: Sufficiently Active  . Days of Exercise per Week: 5 days  . Minutes of Exercise per Session: 30 min  Stress: No Stress Concern Present  . Feeling of Stress : Not at all  Social Connections: Moderately Integrated  . Frequency of Communication with Friends and Family: More than three  times a week  . Frequency of Social Gatherings with Friends and Family: More than three times a week  . Attends Religious Services: More than 4 times per year  . Active Member of Clubs or Organizations: No  . Attends Archivist Meetings: Never  . Marital Status: Married  Human resources officer  Violence: Not on file     BP 126/62   Pulse (!) 48   Ht 5\' 2"  (1.575 m)   Wt 129 lb 3.2 oz (58.6 kg)   SpO2 97%   BMI 23.63 kg/m   Physical Exam:  Well appearing NAD HEENT: Unremarkable Neck:  No JVD, no thyromegally Lymphatics:  No adenopathy Back:  No CVA tenderness Lungs:  Clear HEART:  Regular rate rhythm, no murmurs, no rubs, no clicks Abd:  soft, positive bowel sounds, no organomegally, no rebound, no guarding Ext:  2 plus pulses, no edema, no cyanosis, no clubbing Skin:  No rashes no nodules Neuro:  CN II through XII intact, motor grossly intact   Cardiac monitor - reviewed NSR with sinus brady and sinus pauses of over 3 seconds during the daytime.  2D echo - preserved LV function with diastolic dysfunction.  Assess/Plan: 1. Sinus node dysfunction - she has daytime pauses of over 3 seconds and a prior h/o syncope. I have discussed the indications/risks/benefits/goals/expectations of PPM insertion and she wishes to proceed. 2. 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  3. HTN - her bp is controlled on multiple meds. She will maintain a low sodium diet. 4. COPD - her dyspnea is controlled with ventolin inhaler and smoking cessation.  Carleene Overlie Karlton Maya,MD

## 2020-05-11 NOTE — H&P (View-Only) (Signed)
HPI Sheila Morales is referred today by Dr. [name] for evaluation of symptomatic sinus node dysfunction. She has a h/o cutaneous lupus and copd. She stopped smoking in October. The patient has dizzy spells and one episode of syncope. She wore a cardiac monitor demonstrating pauses of over 3 seconds in the morning and in the daytime.   No Known Allergies   Current Outpatient Medications  Medication Sig Dispense Refill  . albuterol (VENTOLIN HFA) 108 (90 Base) MCG/ACT inhaler Inhale 1-2 puffs into the lungs every 6 (six) hours as needed for wheezing or shortness of breath. 8 g 2  . amLODipine (NORVASC) 2.5 MG tablet Take 1 tablet (2.5 mg total) by mouth daily. 90 tablet 3  . aspirin 81 MG tablet Take 81 mg by mouth daily.    . calcium carbonate (OS-CAL - DOSED IN MG OF ELEMENTAL CALCIUM) 1250 (500 Ca) MG tablet Take 1 tablet by mouth.    . cholecalciferol (VITAMIN D) 1000 UNITS tablet Take 1,000 Units by mouth daily.    Sheila Morales dicyclomine (BENTYL) 10 MG capsule Take 1 capsule (10 mg total) by mouth 4 (four) times daily -  before meals and at bedtime. 90 capsule 0  . doxazosin (CARDURA) 1 MG tablet TAKE ONE TABLET BY MOUTH ONCE DAILY 30 tablet 2  . folic acid (FOLVITE) 1 MG tablet Take 1 mg by mouth daily.    Sheila Morales gabapentin (NEURONTIN) 300 MG capsule TAKE TWO CAPSULES BY MOUTH EVERY MORNING, TWO capsules each afternoon AND THREE CAPSULES AT bedtime 630 capsule 1  . hydroxychloroquine (PLAQUENIL) 200 MG tablet Take 200 mg by mouth daily.    . hydrOXYzine (ATARAX/VISTARIL) 10 MG tablet Take 1 tablet (10 mg total) by mouth 3 (three) times daily as needed. 45 tablet 0  . methotrexate 2.5 MG tablet Take 10 mg by mouth once a week. Thursdays    . MULTIPLE VITAMIN PO Take 1 tablet by mouth daily.    . Omega-3 1000 MG CAPS Take 1,000 mg by mouth daily.    . RESTASIS 0.05 % ophthalmic emulsion INSTILL 1 DROP INTO BOTH EYES TWICE A DAY  6  . saccharomyces boulardii (FLORASTOR) 250 MG capsule Take 250 mg by  mouth 2 (two) times daily.    Sheila Morales telmisartan (MICARDIS) 40 MG tablet Take 1 tablet (40 mg total) by mouth in the morning and at bedtime. 60 tablet 5  . triamcinolone cream (KENALOG) 0.1 % Apply 1 application topically 2 (two) times daily. 30 g 0  . triamcinolone ointment (KENALOG) 0.5 % Apply 1 application topically 2 (two) times daily. 30 g 0  . umeclidinium-vilanterol (ANORO ELLIPTA) 62.5-25 MCG/INH AEPB INHALE ONE PUFF BY MOUTH ONCE DAILY 60 each 5  . vitamin C (ASCORBIC ACID) 500 MG tablet Take 500 mg by mouth daily.    . vitamin E 1000 UNIT capsule Take 1,000 Units by mouth daily.    Sheila Morales zolpidem (AMBIEN) 5 MG tablet TAKE ONE TABLET BY MOUTH EVERYDAY AT BEDTIME AS NEEDED 30 tablet 0   No current facility-administered medications for this visit.     Past Medical History:  Diagnosis Date  . Acute respiratory failure with hypoxia ([place]) 11/05/2017  . Arthritis   . Colitis, ischemic ([place])   . External hemorrhoids   . H/O: rheumatic fever   . IBS (irritable bowel syndrome)   . Personal history colonic adenoma 03/25/2008   06/2007 right sided adenoma and 2 right hyperplastic polyps    . Restless leg syndrome  questionable  . Systemic lupus erythematosus ([place])   . Tubular adenoma of colon   . 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   Past Surgical History:  Procedure Laterality Date  . CHOLECYSTECTOMY    . CHOLECYSTECTOMY, LAPAROSCOPIC    . COLONOSCOPY    . ESOPHAGOGASTRODUODENOSCOPY    . lesion, vulva excision    . TONSILLECTOMY    . VIDEO BRONCHOSCOPY Bilateral 11/06/2017   Procedure: VIDEO BRONCHOSCOPY WITHOUT FLUORO;  Surgeon: Marshell Garfinkel, MD;  Location: [facility];  Service: Cardiopulmonary;  Laterality: Bilateral;     Family History  Problem Relation Age of Onset  . Stroke Mother        Deceased, 25s  . Atrial fibrillation Father   . Colon cancer Father   . Other Sister        Pick's  disease, deceased  . Healthy Daughter      Social History   Socioeconomic History  . Marital status: Married    Spouse name: Not on file  . Number of children: 2  . Years of education: 85  . Highest education level: Not on file  Occupational History  . Occupation: Education administrator business  . Occupation: caregiver  Tobacco Use  . Smoking status: Former Smoker    Packs/day: 0.25    Years: 30.00    Pack years: 7.50    Types: Cigarettes    Quit date: 01/21/2020    Years since quitting: 0.3  . Smokeless tobacco: Never Used  . Tobacco comment: Declined info  Vaping Use  . Vaping Use: Never used  Substance and Sexual Activity  . Alcohol use: Yes    Alcohol/week: 5.0 standard drinks    Types: 5 Standard drinks or equivalent per week    Comment: 2 glasses of wine nightly, 4 beers nightly on weekend  . Drug use: No  . Sexual activity: Yes  Other Topics Concern  . Not on file  Social History Narrative   HSG. Married '71. 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       One level home with spouse   Right handed   Caffeine - coffee 2-3 cups am   Exercise - treadmill    Education - 12th grade   Social Determinants of Health   Financial Resource Strain: Low Risk   . Difficulty of Paying Living Expenses: Not hard at all  Food Insecurity: No Food Insecurity  . Worried About Charity fundraiser in the Last Year: Never true  . Ran Out of Food in the Last Year: Never true  Transportation Needs: No Transportation Needs  . Lack of Transportation (Medical): No  . Lack of Transportation (Non-Medical): No  Physical Activity: Sufficiently Active  . Days of Exercise per Week: 5 days  . Minutes of Exercise per Session: 30 min  Stress: No Stress Concern Present  . Feeling of Stress : Not at all  Social Connections: Moderately Integrated  . Frequency of Communication with Friends and Family: More than three  times a week  . Frequency of Social Gatherings with Friends and Family: More than three times a week  . Attends Religious Services: More than 4 times per year  . Active Member of Clubs or Organizations: No  . Attends Archivist Meetings: Never  . Marital Status: Married  Human resources officer  Violence: Not on file     BP 126/62   Pulse (!) 48   Ht 5\' 2"  (1.575 m)   Wt 129 lb 3.2 oz (58.6 kg)   SpO2 97%   BMI 23.63 kg/m   Physical Exam:  Well appearing NAD HEENT: Unremarkable Neck:  No JVD, no thyromegally Lymphatics:  No adenopathy Back:  No CVA tenderness Lungs:  Clear HEART:  Regular rate rhythm, no murmurs, no rubs, no clicks Abd:  soft, positive bowel sounds, no organomegally, no rebound, no guarding Ext:  2 plus pulses, no edema, no cyanosis, no clubbing Skin:  No rashes no nodules Neuro:  CN II through XII intact, motor grossly intact   Cardiac monitor - reviewed NSR with sinus brady and sinus pauses of over 3 seconds during the daytime.  2D echo - preserved LV function with diastolic dysfunction.  Assess/Plan: 1. Sinus node dysfunction - she has daytime pauses of over 3 seconds and a prior h/o syncope. I have discussed the indications/risks/benefits/goals/expectations of PPM insertion and she wishes to proceed. 2. 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  3. HTN - her bp is controlled on multiple meds. She will maintain a low sodium diet. 4. COPD - her dyspnea is controlled with ventolin inhaler and smoking cessation.  Carleene Overlie Kylene Zamarron,MD

## 2020-05-11 NOTE — Patient Instructions (Addendum)
Medication Instructions:  Your physician recommends that you continue on your current medications as directed. Please refer to the Current Medication list given to you today.  Labwork: You will get lab work today:  BMP and CBC  Testing/Procedures: None ordered.  Follow-Up:  SEE INSTRUCTION LETTER  Any Other Special Instructions Will Be Listed Below (If Applicable).  If you need a refill on your cardiac medications before your next appointment, please call your pharmacy.    Pacemaker Implantation, Adult Pacemaker implantation is a procedure to place a pacemaker inside the chest. A pacemaker is a small computer that sends electrical signals to the heart and helps the heart beat normally. A pacemaker also stores information about heart rhythms. You may need pacemaker implantation if you have:  A slow heartbeat (bradycardia).  Loss of consciousness that happens repeatedly (syncope) or repeated episodes of dizziness or light-headedness because of an irregular heart rate.  Shortness of breath (dyspnea) due to heart problems. The pacemaker usually attaches to your heart through a wire called a lead. One or two leads may be needed. There are different types of pacemakers:  Transvenous pacemaker. This type is placed under the skin or muscle of your upper chest area. The lead goes through a vein in the chest area to reach the inside of the heart.  Epicardial pacemaker. This type is placed under the skin or muscle of your chest or abdomen. The lead goes through your chest to the outside of the heart. Tell a health care provider about:  Any allergies you have.  All medicines you are taking, including vitamins, herbs, eye drops, creams, and over-the-counter medicines.  Any problems you or family members have had with anesthetic medicines.  Any blood or bone disorders you have.  Any surgeries you have had.  Any medical conditions you have.  Whether you are pregnant or may be  pregnant. What are the risks? Generally, this is a safe procedure. However, problems may occur, including:  Infection.  Bleeding.  Failure of the pacemaker or the lead.  Collapse of a lung or bleeding into a lung.  Blood clot inside a blood vessel with a lead.  Damage to the heart.  Infection inside the heart (endocarditis).  Allergic reactions to medicines. What happens before the procedure? Staying hydrated Follow instructions from your health care provider about hydration, which may include:  Up to 2 hours before the procedure - you may continue to drink clear liquids, such as water, clear fruit juice, black coffee, and plain tea.   Eating and drinking restrictions Follow instructions from your health care provider about eating and drinking, which may include:  8 hours before the procedure - stop eating heavy meals or foods, such as meat, fried foods, or fatty foods.  6 hours before the procedure - stop eating light meals or foods, such as toast or cereal.  6 hours before the procedure - stop drinking milk or drinks that contain milk.  2 hours before the procedure - stop drinking clear liquids. Medicines Ask your health care provider about:  Changing or stopping your regular medicines. This is especially important if you are taking diabetes medicines or blood thinners.  Taking medicines such as aspirin and ibuprofen. These medicines can thin your blood. Do not take these medicines unless your health care provider tells you to take them.  Taking over-the-counter medicines, vitamins, herbs, and supplements. Tests You may have:  A heart evaluation. This may include: ? An electrocardiogram (ECG). This involves placing patches on   your skin to check your heart rhythm. ? A chest X-ray. ? An echocardiogram. This is a test that uses sound waves (ultrasound) to produce an image of the heart. ? A cardiac rhythm monitor. This is used to record your heart rhythm and any events  for a longer period of time.  Blood tests.  Genetic testing. General instructions  Do not use any products that contain nicotine or tobacco for at least 4 weeks before the procedure. These products include cigarettes, e-cigarettes, and chewing tobacco. If you need help quitting, ask your health care provider.  Ask your health care provider: ? How your surgery site will be marked. ? What steps will be taken to help prevent infection. These steps may include:  Removing hair at the surgery site.  Washing skin with a germ-killing soap.  Receiving antibiotic medicine.  Plan to have someone take you home from the hospital or clinic.  If you will be going home right after the procedure, plan to have someone with you for 24 hours. What happens during the procedure?  An IV will be inserted into one of your veins.  You will be given one or more of the following: ? A medicine to help you relax (sedative). ? A medicine to numb the area (local anesthetic). ? A medicine to make you fall asleep (general anesthetic).  The next steps vary depending on the type of pacemaker you will be getting. ? If you are getting a transvenous pacemaker:  An incision will be made in your upper chest.  A pocket will be made for the pacemaker. It may be placed under the skin or between layers of muscle.  The lead will be inserted into a blood vessel that goes to the heart.  While X-rays are taken by an imaging machine (fluoroscopy), the lead will be advanced through the vein to the inside of your heart.  The other end of the lead will be tunneled under the skin and attached to the pacemaker. ? If you are getting an epicardial pacemaker:  An incision will be made near your ribs or breastbone (sternum) for the lead.  The lead will be attached to the outside of your heart.  Another incision will be made in your chest or upper abdomen to create a pocket for the pacemaker.  The free end of the lead will  be tunneled under the skin and attached to the pacemaker.  The transvenous or epicardial pacemaker will be tested. Imaging studies may be done to check the lead position.  The incisions will be closed with stitches (sutures), adhesive strips, or skin glue.  Bandages (dressings) will be placed over the incisions. The procedure may vary among health care providers and hospitals. What happens after the procedure?  Your blood pressure, heart rate, breathing rate, and blood oxygen level will be monitored until you leave the hospital or clinic.  You may be given antibiotics.  You will be given pain medicine.  An ECG and chest X-rays will be done.  You may need to wear a continuous type of ECG (Holter monitor) to check your heart rhythm.  Your health care provider will program the pacemaker.  If you were given a sedative during the procedure, it can affect you for several hours. Do not drive or operate machinery until your health care provider says that it is safe.  You will be given a pacemaker identification card. This card lists the implant date, device model, and manufacturer of your pacemaker. Summary    A pacemaker is a small computer that sends electrical signals to the heart and helps the heart beat normally.  There are different types of pacemakers. A pacemaker may be placed under the skin or muscle of your chest or abdomen.  Follow instructions from your health care provider about eating and drinking and about taking medicines before the procedure. This information is not intended to replace advice given to you by your health care provider. Make sure you discuss any questions you have with your health care provider. Document Revised: 03/10/2019 Document Reviewed: 03/10/2019 Elsevier Patient Education  2021 [employer] Inc.  

## 2020-05-15 DIAGNOSIS — M5417 Radiculopathy, lumbosacral region: Secondary | ICD-10-CM | POA: Diagnosis not present

## 2020-05-15 DIAGNOSIS — M5414 Radiculopathy, thoracic region: Secondary | ICD-10-CM | POA: Diagnosis not present

## 2020-05-15 DIAGNOSIS — M9903 Segmental and somatic dysfunction of lumbar region: Secondary | ICD-10-CM | POA: Diagnosis not present

## 2020-05-15 DIAGNOSIS — M9901 Segmental and somatic dysfunction of cervical region: Secondary | ICD-10-CM | POA: Diagnosis not present

## 2020-05-15 DIAGNOSIS — M9902 Segmental and somatic dysfunction of thoracic region: Secondary | ICD-10-CM | POA: Diagnosis not present

## 2020-05-15 DIAGNOSIS — M531 Cervicobrachial syndrome: Secondary | ICD-10-CM | POA: Diagnosis not present

## 2020-05-15 MED ORDER — DOXAZOSIN MESYLATE 1 MG PO TABS: 1.0000 mg | ORAL_TABLET | Freq: Every day | ORAL | 0 refills | Status: DC

## 2020-05-15 NOTE — Telephone Encounter (Signed)
Can have 1 90 day fill which is sent in but should follow up with [place] for more as she has not seen our office back since starting this medication.

## 2020-05-15 NOTE — Addendum Note (Signed)
Addended by: Pricilla Holm A on: 05/15/2020 03:04 PM   Modules accepted: Orders

## 2020-05-15 NOTE — Telephone Encounter (Signed)
Received message from [facility] requesting 90 day supply refill for doxazosin 1 mg.   Patient will be starting pill packaging and has requested 90-day supplies, the prescription on file has only 60-day supply remaining.

## 2020-05-15 NOTE — Telephone Encounter (Signed)
See below

## 2020-05-16 ENCOUNTER — Telehealth: Payer: Self-pay | Admitting: Pharmacist

## 2020-05-16 NOTE — Progress Notes (Signed)
Chronic Care Management Pharmacy Assistant   Name: Sheila Morales  MRN: [id number] DOB: [birth year]  Reason for Encounter: Medication Review  Patient Questions:  1.  Have you seen any other providers since your last visit? No  2.  Any changes in your medicines or health? No   PCP : Hoyt Koch, MD  Allergies:  No Known Allergies  Medications: Outpatient Encounter Medications as of 05/16/2020  Medication Sig  . albuterol (VENTOLIN HFA) 108 (90 Base) MCG/ACT inhaler Inhale 1-2 puffs into the lungs every 6 (six) hours as needed for wheezing or shortness of breath.  Marland Kitchen amLODipine (NORVASC) 2.5 MG tablet Take 1 tablet (2.5 mg total) by mouth daily.  Marland Kitchen aspirin 81 MG tablet Take 81 mg by mouth daily.  . calcium carbonate (OS-CAL - DOSED IN MG OF ELEMENTAL CALCIUM) 1250 (500 Ca) MG tablet Take 1 tablet by mouth.  . cholecalciferol (VITAMIN D) 1000 UNITS tablet Take 1,000 Units by mouth daily.  Marland Kitchen dicyclomine (BENTYL) 10 MG capsule Take 1 capsule (10 mg total) by mouth 4 (four) times daily -  before meals and at bedtime.  Marland Kitchen doxazosin (CARDURA) 1 MG tablet Take 1 tablet (1 mg total) by mouth daily.  . folic acid (FOLVITE) 1 MG tablet Take 1 mg by mouth daily.  Marland Kitchen gabapentin (NEURONTIN) 300 MG capsule TAKE TWO CAPSULES BY MOUTH EVERY MORNING, TWO capsules each afternoon AND THREE CAPSULES AT bedtime  . hydroxychloroquine (PLAQUENIL) 200 MG tablet Take 200 mg by mouth daily.  . hydrOXYzine (ATARAX/VISTARIL) 10 MG tablet Take 1 tablet (10 mg total) by mouth 3 (three) times daily as needed.  . methotrexate 2.5 MG tablet Take 10 mg by mouth once a week. Thursdays  . MULTIPLE VITAMIN PO Take 1 tablet by mouth daily.  . Omega-3 1000 MG CAPS Take 1,000 mg by mouth daily.  . RESTASIS 0.05 % ophthalmic emulsion INSTILL 1 DROP INTO BOTH EYES TWICE A DAY  . saccharomyces boulardii (FLORASTOR) 250 MG capsule Take 250 mg by mouth 2 (two) times daily.  Marland Kitchen telmisartan (MICARDIS) 40 MG tablet Take 1  tablet (40 mg total) by mouth in the morning and at bedtime.  . triamcinolone cream (KENALOG) 0.1 % Apply 1 application topically 2 (two) times daily.  Marland Kitchen triamcinolone ointment (KENALOG) 0.5 % Apply 1 application topically 2 (two) times daily.  Marland Kitchen umeclidinium-vilanterol (ANORO ELLIPTA) 62.5-25 MCG/INH AEPB INHALE ONE PUFF BY MOUTH ONCE DAILY  . vitamin C (ASCORBIC ACID) 500 MG tablet Take 500 mg by mouth daily.  . vitamin E 1000 UNIT capsule Take 1,000 Units by mouth daily.  Marland Kitchen zolpidem (AMBIEN) 5 MG tablet TAKE ONE TABLET BY MOUTH EVERYDAY AT BEDTIME AS NEEDED   No facility-administered encounter medications on file as of 05/16/2020.    Current Diagnosis: Patient Active Problem List   Diagnosis Date Noted  . Bradycardia 05/11/2020  . Pain in right lower leg 02/18/2020  . Episodic lightheadedness 02/02/2020  . Hypertension 02/02/2020  . LLQ pain 11/23/2019  . Cyst of buttocks 10/05/2019  . Methotrexate, long term, current use 07/29/2019  . BPPV (benign paroxysmal positional vertigo) 12/16/2017  . Insomnia 02/24/2017  . COPD (chronic obstructive pulmonary disease) ([place]) 01/22/2017  . Routine general medical examination at a health care facility 02/19/2016  . Hereditary and idiopathic peripheral neuropathy 02/17/2015  . Smokers' cough ([place]) 12/07/2014  . Systemic lupus erythematosus ([place]) 10/13/2013  . Cough 01/18/2009  . Rash 05/31/2008  . Irritable bowel syndrome 03/25/2008  .  RHEUMATIC FEVER, HX OF 01/17/2007    Goals Addressed   None     Follow-Up:  Pharmacist Review   Received call from patient regarding medication management via [facility].  Patient requested an acute fill for Hydroxychlor, Telmisartan, Doxazosin Mesylate, Methotrexate  to be delivered: 05/16/2020 Pharmacy needs refills? No  Confirmed delivery date of 05/16/2020, advised patient that pharmacy will contact them the morning of delivery.  Rosendo Gros, [facility]  Practice Team Manager/ CPA (Clinical  Pharmacist Assistant) [phone]

## 2020-05-18 ENCOUNTER — Other Ambulatory Visit: Payer: Self-pay | Admitting: Emergency Medicine

## 2020-05-24 ENCOUNTER — Other Ambulatory Visit (HOSPITAL_COMMUNITY)
Admission: RE | Admit: 2020-05-24 | Discharge: 2020-05-24 | Disposition: A | Discharge status: Home / Self Care | Payer: PPO | Source: Ambulatory Visit | Attending: Internal Medicine | Admitting: Internal Medicine

## 2020-05-24 DIAGNOSIS — Z01812 Encounter for preprocedural laboratory examination: Secondary | ICD-10-CM | POA: Insufficient documentation

## 2020-05-24 DIAGNOSIS — Z20822 Contact with and (suspected) exposure to covid-19: Secondary | ICD-10-CM | POA: Insufficient documentation

## 2020-05-25 LAB — SARS CORONAVIRUS 2 (TAT 6-24 HRS): SARS Coronavirus 2: NEGATIVE

## 2020-05-25 NOTE — Progress Notes (Signed)
Instructed patient on the following items: Arrival time 0530 Nothing to eat or drink after midnight No meds AM of procedure Responsible person to drive you home and stay with you for 24 hrs Wash with special soap night before and morning of procedure  

## 2020-05-26 ENCOUNTER — Ambulatory Visit (HOSPITAL_COMMUNITY)
Admission: RE | Admit: 2020-05-26 | Discharge: 2020-05-26 | Disposition: A | Discharge status: Home / Self Care | Payer: PPO | Attending: Internal Medicine | Admitting: Internal Medicine

## 2020-05-26 ENCOUNTER — Other Ambulatory Visit: Payer: Self-pay

## 2020-05-26 ENCOUNTER — Ambulatory Visit (HOSPITAL_COMMUNITY)
Admission: RE | Disposition: A | Discharge status: Home / Self Care | Payer: Self-pay | Source: Home / Self Care | Attending: Internal Medicine

## 2020-05-26 ENCOUNTER — Ambulatory Visit (HOSPITAL_COMMUNITY): Payer: PPO

## 2020-05-26 DIAGNOSIS — Z950 Presence of cardiac pacemaker: Secondary | ICD-10-CM

## 2020-05-26 DIAGNOSIS — Z7982 Long term (current) use of aspirin: Secondary | ICD-10-CM | POA: Insufficient documentation

## 2020-05-26 DIAGNOSIS — Z79899 Other long term (current) drug therapy: Secondary | ICD-10-CM | POA: Insufficient documentation

## 2020-05-26 DIAGNOSIS — I495 Sick sinus syndrome: Secondary | ICD-10-CM | POA: Insufficient documentation

## 2020-05-26 DIAGNOSIS — J811 Chronic pulmonary edema: Secondary | ICD-10-CM | POA: Diagnosis not present

## 2020-05-26 DIAGNOSIS — I10 Essential (primary) hypertension: Secondary | ICD-10-CM | POA: Diagnosis not present

## 2020-05-26 DIAGNOSIS — J449 Chronic obstructive pulmonary disease, unspecified: Secondary | ICD-10-CM | POA: Insufficient documentation

## 2020-05-26 DIAGNOSIS — Z87891 Personal history of nicotine dependence: Secondary | ICD-10-CM | POA: Insufficient documentation

## 2020-05-26 HISTORY — PX: PACEMAKER IMPLANT: EP1218

## 2020-05-26 SURGERY — PACEMAKER IMPLANT
Anesthesia: LOCAL

## 2020-05-26 MED ORDER — CEFAZOLIN SODIUM-DEXTROSE 2-4 GM/100ML-% IV SOLN
INTRAVENOUS | Status: AC
  Filled 2020-05-26: qty 100

## 2020-05-26 MED ORDER — SODIUM CHLORIDE 0.9 % IV SOLN: INTRAVENOUS | Status: DC

## 2020-05-26 MED ORDER — MIDAZOLAM HCL 5 MG/5ML IJ SOLN
INTRAMUSCULAR | Status: DC | PRN
  Administered 2020-05-26 (×2): 1 mg via INTRAVENOUS

## 2020-05-26 MED ORDER — CEFAZOLIN SODIUM-DEXTROSE 1-4 GM/50ML-% IV SOLN
1.0000 g | Freq: Four times a day (QID) | INTRAVENOUS | Status: DC
  Filled 2020-05-26: qty 50

## 2020-05-26 MED ORDER — CHLORHEXIDINE GLUCONATE 4 % EX LIQD: 4.0000 "application " | Freq: Once | CUTANEOUS | Status: DC

## 2020-05-26 MED ORDER — HEPARIN (PORCINE) IN NACL 1000-0.9 UT/500ML-% IV SOLN
INTRAVENOUS | Status: DC | PRN
  Administered 2020-05-26: 500 mL

## 2020-05-26 MED ORDER — CEFAZOLIN SODIUM-DEXTROSE 1-4 GM/50ML-% IV SOLN
INTRAVENOUS | Status: AC
  Administered 2020-05-26: 1 g via INTRAVENOUS
  Filled 2020-05-26: qty 50

## 2020-05-26 MED ORDER — SODIUM CHLORIDE 0.9 % IV SOLN
80.0000 mg | INTRAVENOUS | Status: AC
  Administered 2020-05-26: 80 mg

## 2020-05-26 MED ORDER — HEPARIN (PORCINE) IN NACL 1000-0.9 UT/500ML-% IV SOLN
INTRAVENOUS | Status: AC
  Filled 2020-05-26: qty 500

## 2020-05-26 MED ORDER — FENTANYL CITRATE (PF) 100 MCG/2ML IJ SOLN
INTRAMUSCULAR | Status: DC | PRN
  Administered 2020-05-26 (×2): 12.5 ug via INTRAVENOUS

## 2020-05-26 MED ORDER — ONDANSETRON HCL 4 MG/2ML IJ SOLN: 4.0000 mg | Freq: Four times a day (QID) | INTRAMUSCULAR | Status: DC | PRN

## 2020-05-26 MED ORDER — LIDOCAINE HCL (PF) 1 % IJ SOLN
INTRAMUSCULAR | Status: DC | PRN
  Administered 2020-05-26: 55 mL

## 2020-05-26 MED ORDER — ACETAMINOPHEN 325 MG PO TABS
325.0000 mg | ORAL_TABLET | ORAL | Status: DC | PRN
Start: 2020-05-26 — End: 2020-05-26

## 2020-05-26 MED ORDER — CEFAZOLIN SODIUM-DEXTROSE 2-4 GM/100ML-% IV SOLN
2.0000 g | INTRAVENOUS | Status: AC
  Administered 2020-05-26: 2 g via INTRAVENOUS

## 2020-05-26 MED ORDER — MIDAZOLAM HCL 5 MG/5ML IJ SOLN
INTRAMUSCULAR | Status: AC
  Filled 2020-05-26: qty 5

## 2020-05-26 MED ORDER — SODIUM CHLORIDE 0.9 % IV SOLN
INTRAVENOUS | Status: AC
  Filled 2020-05-26: qty 2

## 2020-05-26 MED ORDER — FENTANYL CITRATE (PF) 100 MCG/2ML IJ SOLN
INTRAMUSCULAR | Status: AC
  Filled 2020-05-26: qty 2

## 2020-05-26 MED ORDER — LIDOCAINE HCL 1 % IJ SOLN
INTRAMUSCULAR | Status: AC
  Filled 2020-05-26: qty 60

## 2020-05-26 SURGICAL SUPPLY — 7 items
CABLE SURGICAL S-101-97-12 (CABLE) ×2 IMPLANT
LEAD SOLIA S PRO MRI 45 (Lead) ×2 IMPLANT
LEAD SOLIA S PRO MRI 53 (Lead) ×2 IMPLANT
PACEMAKER EDORA 8DR-T MRI (Pacemaker) ×2 IMPLANT
PAD PRO RADIOLUCENT 2001M-C (PAD) ×2 IMPLANT
SHEATH 7FR PRELUDE SNAP 13 (SHEATH) ×4 IMPLANT
TRAY PACEMAKER INSERTION (PACKS) ×2 IMPLANT

## 2020-05-26 NOTE — Progress Notes (Signed)
Patients O2 sats have been between 85-89% on RA since returning from procedure. Patient drowsy on arrival, but arousable. PA made aware of O2 sats- when patient awakes O2 sats rise to 89-90%. No hx of O2 use at home. Will place on 2L O2 and continue to monitor.

## 2020-05-26 NOTE — Progress Notes (Signed)
Discharge instructions reviewed with patient. Verbalized understanding. Left message with husband to review instructions. Patient has had CXR. Waiting for MD and REP to come by to see patient. Report given to [place], Therapist, sports.

## 2020-05-26 NOTE — Interval H&P Note (Signed)
History and Physical Interval Note:  05/26/2020 7:49 AM  Sheila Morales  has presented today for surgery, with the diagnosis of bradycardia.  The various methods of treatment have been discussed with the patient and family. After consideration of risks, benefits and other options for treatment, the patient has consented to  Procedure(s): PACEMAKER IMPLANT (N/A) as a surgical intervention.  The patient's history has been reviewed, patient examined, no change in status, stable for surgery.  I have reviewed the patient's chart and labs.  Questions were answered to the patient's satisfaction.     Sheila Morales

## 2020-05-26 NOTE — Discharge Instructions (Signed)
After Your Pacemaker   . You have a [employer] Pacemaker  ACTIVITY . Do not lift your arm above shoulder height for 1 week after your procedure. After 7 days, you may progress as below.  . You should remove your sling 24 hours after your procedure, unless otherwise instructed by your provider.     Friday June 02, 2020  Saturday June 03, 2020 Sunday June 04, 2020 Monday June 05, 2020   . Do not lift, push, pull, or carry anything over 10 pounds with the affected arm until 6 weeks (Friday July 07, 2020 ) after your procedure.   . Do NOT DRIVE until you have been seen for your wound check, or as long as instructed by your healthcare provider.   . Ask your healthcare provider when you can go back to work   INCISION/Dressing . If you are on a blood thinner such as Coumadin, Xarelto, Eliquis, Plavix, or Pradaxa please confirm with your provider when this should be resumed.   . If large square, outer bandage is left in place, this can be removed after 24 hours from your procedure. Do not remove steri-strips or glue as below.   . Monitor your Pacemaker site for redness, swelling, and drainage. Call the device clinic at [phone] if you experience these symptoms or fever/chills.  . If your incision is sealed with Steri-strips or staples, you may shower 10 days after your procedure or when told by your provider. Do not remove the steri-strips or let the shower hit directly on your site. You may wash around your site with soap and water.    Marland Kitchen Avoid lotions, ointments, or perfumes over your incision until it is well-healed.  . You may use a hot tub or a pool AFTER your wound check appointment if the incision is completely closed.  Marland Kitchen PAcemaker Alerts:  Some alerts are vibratory and others beep. These are NOT emergencies. Please call our office to let us know. If this occurs at night or on weekends, it can wait until the next business day. Send a remote transmission.  . If  your device is capable of reading fluid status (for heart failure), you will be offered monthly monitoring to review this with you.   DEVICE MANAGEMENT . Remote monitoring is used to monitor your pacemaker from home. This monitoring is scheduled every 91 days by our office. It allows [place] to keep an eye on the functioning of your device to ensure it is working properly. You will routinely see your Electrophysiologist annually (more often if necessary).   . You should receive your ID card for your new device in 4-8 weeks. Keep this card with you at all times once received. Consider wearing a medical alert bracelet or necklace.  . Your Pacemaker may be MRI compatible. This will be discussed at your next office visit/wound check.  You should avoid contact with strong electric or magnetic fields.    Do not use amateur (ham) radio equipment or electric (arc) welding torches. MP3 player headphones with magnets should not be used. Some devices are safe to use if held at least 12 inches (30 cm) from your Pacemaker. These include power tools, lawn mowers, and speakers. If you are unsure if something is safe to use, ask your health care provider.   When using your cell phone, hold it to the ear that is on the opposite side from the Pacemaker. Do not leave your cell phone in a pocket over the Pacemaker.  You may safely use electric blankets, heating pads, computers, and microwave ovens.  Call the office right away if:  You have chest pain.  You feel more short of breath than you have felt before.  You feel more light-headed than you have felt before.  Your incision starts to open up.  This information is not intended to replace advice given to you by your health care provider. Make sure you discuss any questions you have with your health care provider.

## 2020-05-27 ENCOUNTER — Telehealth: Payer: Self-pay | Admitting: Student

## 2020-05-27 NOTE — Telephone Encounter (Signed)
    Patient called the after hours line reporting she removed her bandage from her PPM site and had some dried blood along her steri-strips. No evidence of active bleeding or drainage. I informed her this is not uncommon and she should continue to monitor the site and [place] aware of any active bleeding or drainage. She voiced understanding of the information and was appreciative of the call.   Signed, Erma Heritage, PA-C 05/27/2020, 11:23 AM Pager: [phone]

## 2020-05-29 ENCOUNTER — Encounter (HOSPITAL_COMMUNITY): Payer: Self-pay | Admitting: Internal Medicine

## 2020-05-29 MED FILL — Lidocaine HCl Local Inj 1%: INTRAMUSCULAR | Qty: 40 | Status: AC

## 2020-05-30 ENCOUNTER — Telehealth: Payer: Self-pay | Admitting: Internal Medicine

## 2020-05-30 NOTE — Telephone Encounter (Signed)
Advised pt driving restrictions in place until after the wound check appt. On 06/08/20.

## 2020-05-30 NOTE — Telephone Encounter (Signed)
Patient wanted to know if she is allowed drive. She had a pacemaker put in 05/26/20. Please advise

## 2020-06-05 ENCOUNTER — Telehealth: Payer: Self-pay | Admitting: Pharmacist

## 2020-06-05 NOTE — Progress Notes (Signed)
Chronic Care Management Pharmacy Assistant   Name: AASHKA SALOMONE  MRN: [id number] DOB: [birth year]  Reason for Encounter: Medication Review   PCP : Hoyt Koch, MD  Allergies:  No Known Allergies  Medications: Outpatient Encounter Medications as of 06/05/2020  Medication Sig Note   albuterol (VENTOLIN HFA) 108 (90 Base) MCG/ACT inhaler Inhale 1-2 puffs into the lungs every 6 (six) hours as needed for wheezing or shortness of breath.    amLODipine (NORVASC) 2.5 MG tablet Take 1 tablet (2.5 mg total) by mouth daily. (Patient taking differently: Take 1.25 mg by mouth daily.)    aspirin 81 MG tablet Take 81 mg by mouth daily.    BLACK COHOSH PO Take 1 capsule by mouth daily.    Calcium Carb-Cholecalciferol (CALCIUM 600 + D PO) Take 1 tablet by mouth daily.    calcium carbonate (OS-CAL - DOSED IN MG OF ELEMENTAL CALCIUM) 1250 (500 Ca) MG tablet Take 1 tablet by mouth daily as needed (heartburn).    Cholecalciferol (VITAMIN D) 50 MCG (2000 UT) tablet Take 2,000 Units by mouth daily.    dicyclomine (BENTYL) 10 MG capsule Take 1 capsule (10 mg total) by mouth 4 (four) times daily -  before meals and at bedtime. (Patient not taking: No sig reported)    doxazosin (CARDURA) 1 MG tablet Take 1 tablet (1 mg total) by mouth daily.    folic acid (FOLVITE) 1 MG tablet Take 1 mg by mouth daily.    gabapentin (NEURONTIN) 300 MG capsule TAKE TWO CAPSULES BY MOUTH EVERY MORNING, TWO capsules each afternoon AND THREE CAPSULES AT bedtime (Patient taking differently: Take 600-900 mg by mouth See admin instructions. Take 600 mg in the morning, 600 mg at lunch, and 900 mg at bedtime)    hydroxychloroquine (PLAQUENIL) 200 MG tablet Take 200 mg by mouth 2 (two) times daily.    ibuprofen (ADVIL) 200 MG tablet Take 400 mg by mouth every 6 (six) hours as needed for headache or mild pain.    loratadine (CLARITIN) 10 MG tablet Take 10 mg by mouth daily as needed for allergies.    methotrexate  2.5 MG tablet Take 10 mg by mouth every Thursday.    Misc Natural Products (ADVANCED JOINT RELIEF) CAPS Take 1 capsule by mouth daily. Joint Advantage Gold 5x    Multiple Vitamin (MULTIVITAMIN WITH MINERALS) TABS tablet Take 1 tablet by mouth daily.    Nutritional Supplements (MENOPAUSE FORMULA) TABS Take 1 tablet by mouth daily.    Omega-3 Fatty Acids (OMEGA 3 PO) Take 1 capsule by mouth daily.    OVER THE COUNTER MEDICATION Take 1 capsule by mouth daily. brain essentials otc supplement    Polyethyl Glycol-Propyl Glycol (SYSTANE OP) Place 1 drop into both eyes 2 (two) times daily.    RESTASIS 0.05 % ophthalmic emulsion Place 1 drop into both eyes 2 (two) times daily.    saccharomyces boulardii (FLORASTOR) 250 MG capsule Take 250 mg by mouth 2 (two) times daily.    telmisartan (MICARDIS) 40 MG tablet Take 1 tablet (40 mg total) by mouth in the morning and at bedtime.    triamcinolone cream (KENALOG) 0.1 % Apply 1 application topically 2 (two) times daily. (Patient taking differently: Apply 1 application topically 2 (two) times daily as needed (lupus flare).) 05/18/2020: Pt has both ointment and cream   triamcinolone ointment (KENALOG) 0.5 % Apply 1 application topically 2 (two) times daily. (Patient taking differently: Apply 1 application topically 2 (two) times daily  as needed (lupus flare).) 05/18/2020: Pt has both ointment and cream   umeclidinium-vilanterol (ANORO ELLIPTA) 62.5-25 MCG/INH AEPB INHALE 1 PUFF BY MOUTH ONCE DAILY    vitamin C (ASCORBIC ACID) 500 MG tablet Take 500 mg by mouth daily.    vitamin E 180 MG (400 UNITS) capsule Take 400 Units by mouth daily.    zolpidem (AMBIEN) 5 MG tablet TAKE ONE TABLET BY MOUTH EVERYDAY AT BEDTIME AS NEEDED (Patient taking differently: Take 5 mg by mouth at bedtime.)    No facility-administered encounter medications on file as of 06/05/2020.    Current Diagnosis: Patient Active Problem List   Diagnosis Date Noted   Sinus node  dysfunction ([place]) 05/26/2020   Bradycardia 05/11/2020   Pain in right lower leg 02/18/2020   Episodic lightheadedness 02/02/2020   Hypertension 02/02/2020   LLQ pain 11/23/2019   Cyst of buttocks 10/05/2019   Methotrexate, long term, current use 07/29/2019   BPPV (benign paroxysmal positional vertigo) 12/16/2017   Insomnia 02/24/2017   COPD (chronic obstructive pulmonary disease) ([place]) 01/22/2017   Routine general medical examination at a health care facility 02/19/2016   Hereditary and idiopathic peripheral neuropathy 02/17/2015   Smokers' cough ([place]) 12/07/2014   Systemic lupus erythematosus ([place]) 10/13/2013   Cough 01/18/2009   Rash 05/31/2008   Irritable bowel syndrome 03/25/2008   RHEUMATIC FEVER, HX OF 01/17/2007    Goals Addressed   None     Follow-Up:  Pharmacist Review    Received call from patient regarding medication management via [facility].  Patient requested an acute fill for Zolpidem and Folic Acid to be delivered 06/06/2020 Pharmacy needs refills? No  Confirmed delivery date of 06/06/20, advised patient that pharmacy will contact them the morning of delivery.  Total Time:15 minutes  Wendy Poet, [place] [phone]

## 2020-06-06 ENCOUNTER — Telehealth: Payer: Self-pay | Admitting: Pharmacist

## 2020-06-06 NOTE — Progress Notes (Signed)
Chronic Care Management Pharmacy Assistant   Name: Sheila Morales  MRN: [id number] DOB: February 05, 1951  Reason for Encounter: Medication Review   PCP : Hoyt Koch, MD  Allergies:  No Known Allergies  Medications: Outpatient Encounter Medications as of 06/06/2020  Medication Sig Note  . albuterol (VENTOLIN HFA) 108 (90 Base) MCG/ACT inhaler Inhale 1-2 puffs into the lungs every 6 (six) hours as needed for wheezing or shortness of breath.   Marland Kitchen amLODipine (NORVASC) 2.5 MG tablet Take 1 tablet (2.5 mg total) by mouth daily. (Patient taking differently: Take 1.25 mg by mouth daily.)   . aspirin 81 MG tablet Take 81 mg by mouth daily.   Marland Kitchen BLACK COHOSH PO Take 1 capsule by mouth daily.   . Calcium Carb-Cholecalciferol (CALCIUM 600 + D PO) Take 1 tablet by mouth daily.   . calcium carbonate (OS-CAL - DOSED IN MG OF ELEMENTAL CALCIUM) 1250 (500 Ca) MG tablet Take 1 tablet by mouth daily as needed (heartburn).   . Cholecalciferol (VITAMIN D) 50 MCG (2000 UT) tablet Take 2,000 Units by mouth daily.   Marland Kitchen dicyclomine (BENTYL) 10 MG capsule Take 1 capsule (10 mg total) by mouth 4 (four) times daily -  before meals and at bedtime. (Patient not taking: No sig reported)   . doxazosin (CARDURA) 1 MG tablet Take 1 tablet (1 mg total) by mouth daily.   . folic acid (FOLVITE) 1 MG tablet Take 1 mg by mouth daily.   Marland Kitchen gabapentin (NEURONTIN) 300 MG capsule TAKE TWO CAPSULES BY MOUTH EVERY MORNING, TWO capsules each afternoon AND THREE CAPSULES AT bedtime (Patient taking differently: Take 600-900 mg by mouth See admin instructions. Take 600 mg in the morning, 600 mg at lunch, and 900 mg at bedtime)   . hydroxychloroquine (PLAQUENIL) 200 MG tablet Take 200 mg by mouth 2 (two) times daily.   Marland Kitchen ibuprofen (ADVIL) 200 MG tablet Take 400 mg by mouth every 6 (six) hours as needed for headache or mild pain.   Marland Kitchen loratadine (CLARITIN) 10 MG tablet Take 10 mg by mouth daily as needed for allergies.   . methotrexate  2.5 MG tablet Take 10 mg by mouth every Thursday.   . Misc Natural Products (ADVANCED JOINT RELIEF) CAPS Take 1 capsule by mouth daily. Joint Advantage Gold 5x   . Multiple Vitamin (MULTIVITAMIN WITH MINERALS) TABS tablet Take 1 tablet by mouth daily.   . Nutritional Supplements (MENOPAUSE FORMULA) TABS Take 1 tablet by mouth daily.   . Omega-3 Fatty Acids (OMEGA 3 PO) Take 1 capsule by mouth daily.   Marland Kitchen OVER THE COUNTER MEDICATION Take 1 capsule by mouth daily. brain essentials otc supplement   . Polyethyl Glycol-Propyl Glycol (SYSTANE OP) Place 1 drop into both eyes 2 (two) times daily.   . RESTASIS 0.05 % ophthalmic emulsion Place 1 drop into both eyes 2 (two) times daily.   Marland Kitchen saccharomyces boulardii (FLORASTOR) 250 MG capsule Take 250 mg by mouth 2 (two) times daily.   Marland Kitchen telmisartan (MICARDIS) 40 MG tablet Take 1 tablet (40 mg total) by mouth in the morning and at bedtime.   . triamcinolone cream (KENALOG) 0.1 % Apply 1 application topically 2 (two) times daily. (Patient taking differently: Apply 1 application topically 2 (two) times daily as needed (lupus flare).) 05/18/2020: Pt has both ointment and cream  . triamcinolone ointment (KENALOG) 0.5 % Apply 1 application topically 2 (two) times daily. (Patient taking differently: Apply 1 application topically 2 (two) times daily  as needed (lupus flare).) 05/18/2020: Pt has both ointment and cream  . umeclidinium-vilanterol (ANORO ELLIPTA) 62.5-25 MCG/INH AEPB INHALE 1 PUFF BY MOUTH ONCE DAILY   . vitamin C (ASCORBIC ACID) 500 MG tablet Take 500 mg by mouth daily.   . vitamin E 180 MG (400 UNITS) capsule Take 400 Units by mouth daily.   Marland Kitchen zolpidem (AMBIEN) 5 MG tablet TAKE ONE TABLET BY MOUTH EVERYDAY AT BEDTIME AS NEEDED (Patient taking differently: Take 5 mg by mouth at bedtime.)    No facility-administered encounter medications on file as of 06/06/2020.    Current Diagnosis: Patient Active Problem List   Diagnosis Date Noted  . Sinus node  dysfunction ([place]) 05/26/2020  . Bradycardia 05/11/2020  . Pain in right lower leg 02/18/2020  . Episodic lightheadedness 02/02/2020  . Hypertension 02/02/2020  . LLQ pain 11/23/2019  . Cyst of buttocks 10/05/2019  . Methotrexate, long term, current use 07/29/2019  . BPPV (benign paroxysmal positional vertigo) 12/16/2017  . Insomnia 02/24/2017  . COPD (chronic obstructive pulmonary disease) ([place]) 01/22/2017  . Routine general medical examination at a health care facility 02/19/2016  . Hereditary and idiopathic peripheral neuropathy 02/17/2015  . Smokers' cough ([place]) 12/07/2014  . Systemic lupus erythematosus ([place]) 10/13/2013  . Cough 01/18/2009  . Rash 05/31/2008  . Irritable bowel syndrome 03/25/2008  . RHEUMATIC FEVER, HX OF 01/17/2007    Goals Addressed   None       BP Readings from Last 3 Encounters:  05/26/20 (!) 140/52  05/11/20 126/62  04/24/20 (!) 124/54    Lab Results  Component Value Date   HGBA1C 5.7 02/24/2017     Patient obtains medications through Adherence Packaging  90 Days   Last adherence delivery included (date:06/06/20): (medication name and frequency) Zolpidem 5 mg  Folic Acid 1mg   Patient declined: Anoro Ellipt 06/18/20 patient has enough until March Amlodipine 08/17/20 Hydroxychloroquine 08/17/20 Telmisartan 08/17/20 Doxazosin 08/17/20 Gabapentin 07/02/20  last delivery due to patient due for next delivery in March and April  Patient is due for next adherence delivery on: 08/17/20. Called patient and reviewed medication needs.  Patient declined need for refills at this time. Patient is aware of how to contact pharmacist if refills are needed prior to next adherence delivery.   Wendy Poet, [place] [phone]   Total time:30

## 2020-06-08 ENCOUNTER — Other Ambulatory Visit: Payer: Self-pay

## 2020-06-08 ENCOUNTER — Ambulatory Visit (INDEPENDENT_AMBULATORY_CARE_PROVIDER_SITE_OTHER): Payer: PPO | Admitting: Emergency Medicine

## 2020-06-08 DIAGNOSIS — R001 Bradycardia, unspecified: Secondary | ICD-10-CM

## 2020-06-08 LAB — CUP PACEART INCLINIC DEVICE CHECK
Date Time Interrogation Session: 20220217093956
Implantable Lead Implant Date: 20220204
Implantable Lead Implant Date: 20220204
Implantable Lead Location: 753859
Implantable Lead Location: 753860
Implantable Lead Model: 377169
Implantable Lead Model: 377171
Implantable Lead Serial Number: 8000143637
Implantable Lead Serial Number: 8000151447
Implantable Pulse Generator Implant Date: 20220204
Lead Channel Impedance Value: 526 Ohm
Lead Channel Impedance Value: 585 Ohm
Lead Channel Pacing Threshold Amplitude: 0.6 V
Lead Channel Pacing Threshold Amplitude: 0.8 V
Lead Channel Pacing Threshold Pulse Width: 0.4 ms
Lead Channel Pacing Threshold Pulse Width: 0.4 ms
Lead Channel Sensing Intrinsic Amplitude: 14.2 mV
Lead Channel Sensing Intrinsic Amplitude: 2.2 mV
Lead Channel Setting Pacing Amplitude: 3 V
Lead Channel Setting Pacing Amplitude: 3 V
Lead Channel Setting Pacing Pulse Width: 0.4 ms
Pulse Gen Model: 407145
Pulse Gen Serial Number: 70020732

## 2020-06-08 NOTE — Progress Notes (Signed)
Wound check appointment. Steri-strips removed. Wound without redness or edema. Incision edges approximated, wound well healed. Normal device function. Thresholds, sensing, and impedances consistent with implant measurements. Device programmed at 3.0V/auto capture programmed on for extra safety margin until 3 month visit. Histogram distribution appropriate for patient and level of activity. No mode switches or high ventricular rates noted. Patient educated about wound care, arm mobility, lifting restrictions. ROV in 3 months with Dr. Lovena Le. Next home remote 08/25/20.

## 2020-06-22 DIAGNOSIS — M328 Other forms of systemic lupus erythematosus: Secondary | ICD-10-CM | POA: Diagnosis not present

## 2020-06-23 ENCOUNTER — Telehealth: Payer: Self-pay | Admitting: Pharmacist

## 2020-06-23 NOTE — Progress Notes (Addendum)
Chronic Care Management Pharmacy Assistant   Name: Sheila Morales  MRN: [id number] DOB: 05/24/1950  Reason for Encounter: Medication Review  Patient Questions:  1.  Have you seen any other providers since your last visit? No  2.  Any changes in your medicines or health? No    PCP : Hoyt Koch, MD  Allergies:  No Known Allergies  Medications: Outpatient Encounter Medications as of 06/23/2020  Medication Sig Note   albuterol (VENTOLIN HFA) 108 (90 Base) MCG/ACT inhaler Inhale 1-2 puffs into the lungs every 6 (six) hours as needed for wheezing or shortness of breath.    amLODipine (NORVASC) 2.5 MG tablet Take 1 tablet (2.5 mg total) by mouth daily. (Patient taking differently: Take 1.25 mg by mouth daily.)    aspirin 81 MG tablet Take 81 mg by mouth daily.    BLACK COHOSH PO Take 1 capsule by mouth daily.    Calcium Carb-Cholecalciferol (CALCIUM 600 + D PO) Take 1 tablet by mouth daily.    calcium carbonate (OS-CAL - DOSED IN MG OF ELEMENTAL CALCIUM) 1250 (500 Ca) MG tablet Take 1 tablet by mouth daily as needed (heartburn).    Cholecalciferol (VITAMIN D) 50 MCG (2000 UT) tablet Take 2,000 Units by mouth daily.    dicyclomine (BENTYL) 10 MG capsule Take 1 capsule (10 mg total) by mouth 4 (four) times daily -  before meals and at bedtime. (Patient not taking: No sig reported)    doxazosin (CARDURA) 1 MG tablet Take 1 tablet (1 mg total) by mouth daily.    folic acid (FOLVITE) 1 MG tablet Take 1 mg by mouth daily.    gabapentin (NEURONTIN) 300 MG capsule TAKE TWO CAPSULES BY MOUTH EVERY MORNING, TWO capsules each afternoon AND THREE CAPSULES AT bedtime (Patient taking differently: Take 600-900 mg by mouth See admin instructions. Take 600 mg in the morning, 600 mg at lunch, and 900 mg at bedtime)    hydroxychloroquine (PLAQUENIL) 200 MG tablet Take 200 mg by mouth 2 (two) times daily.    ibuprofen (ADVIL) 200 MG tablet Take 400 mg by mouth every 6 (six) hours as needed for  headache or mild pain.    loratadine (CLARITIN) 10 MG tablet Take 10 mg by mouth daily as needed for allergies.    methotrexate 2.5 MG tablet Take 10 mg by mouth every Thursday.    Misc Natural Products (ADVANCED JOINT RELIEF) CAPS Take 1 capsule by mouth daily. Joint Advantage Gold 5x    Multiple Vitamin (MULTIVITAMIN WITH MINERALS) TABS tablet Take 1 tablet by mouth daily.    Nutritional Supplements (MENOPAUSE FORMULA) TABS Take 1 tablet by mouth daily.    Omega-3 Fatty Acids (OMEGA 3 PO) Take 1 capsule by mouth daily.    OVER THE COUNTER MEDICATION Take 1 capsule by mouth daily. brain essentials otc supplement    Polyethyl Glycol-Propyl Glycol (SYSTANE OP) Place 1 drop into both eyes 2 (two) times daily.    RESTASIS 0.05 % ophthalmic emulsion Place 1 drop into both eyes 2 (two) times daily.    saccharomyces boulardii (FLORASTOR) 250 MG capsule Take 250 mg by mouth 2 (two) times daily.    telmisartan (MICARDIS) 40 MG tablet Take 1 tablet (40 mg total) by mouth in the morning and at bedtime.    triamcinolone cream (KENALOG) 0.1 % Apply 1 application topically 2 (two) times daily. (Patient taking differently: Apply 1 application topically 2 (two) times daily as needed (lupus flare).) 05/18/2020: Pt has both  ointment and cream   triamcinolone ointment (KENALOG) 0.5 % Apply 1 application topically 2 (two) times daily. (Patient taking differently: Apply 1 application topically 2 (two) times daily as needed (lupus flare).) 05/18/2020: Pt has both ointment and cream   umeclidinium-vilanterol (ANORO ELLIPTA) 62.5-25 MCG/INH AEPB INHALE 1 PUFF BY MOUTH ONCE DAILY    vitamin C (ASCORBIC ACID) 500 MG tablet Take 500 mg by mouth daily.    vitamin E 180 MG (400 UNITS) capsule Take 400 Units by mouth daily.    zolpidem (AMBIEN) 5 MG tablet TAKE ONE TABLET BY MOUTH EVERYDAY AT BEDTIME AS NEEDED (Patient taking differently: Take 5 mg by mouth at bedtime.)    No facility-administered encounter medications on file  as of 06/23/2020.    Current Diagnosis: Patient Active Problem List   Diagnosis Date Noted   Sinus node dysfunction ([place]) 05/26/2020   Bradycardia 05/11/2020   Pain in right lower leg 02/18/2020   Episodic lightheadedness 02/02/2020   Hypertension 02/02/2020   LLQ pain 11/23/2019   Cyst of buttocks 10/05/2019   Methotrexate, long term, current use 07/29/2019   BPPV (benign paroxysmal positional vertigo) 12/16/2017   Insomnia 02/24/2017   COPD (chronic obstructive pulmonary disease) ([place]) 01/22/2017   Routine general medical examination at a health care facility 02/19/2016   Hereditary and idiopathic peripheral neuropathy 02/17/2015   Smokers' cough ([place]) 12/07/2014   Systemic lupus erythematosus ([place]) 10/13/2013   Cough 01/18/2009   Rash 05/31/2008   Irritable bowel syndrome 03/25/2008   RHEUMATIC FEVER, HX OF 01/17/2007    Goals Addressed   None     Follow-Up:  Coordination of Enhanced Pharmacy Services   Reviewed chart for medication changes ahead of medication coordination call.  No OVs, Consults, or hospital visits since last care coordination call/Pharmacist visit. (If appropriate, list visit date, provider name)  No medication changes indicated OR if recent visit, treatment plan here.  BP Readings from Last 3 Encounters:  05/26/20 (!) 140/52  05/11/20 126/62  04/24/20 (!) 124/54    Lab Results  Component Value Date   HGBA1C 5.7 02/24/2017     Patient obtains medications through Vials  90 Days   Last adherence delivery included:  Zolpidem 5 mg Folic acid 1 mg  Patient declined Anoro Ellipt 06/18/20 patient has enough until March Amlodipine 08/17/20 Hydroxychloroquine 08/17/20 Telmisartan 08/17/20 Doxazosin 08/17/20 Gabapentin 07/02/20   last month due to PRN use/additional supply on hand. Explanation of abundance on hand (ie #30 due to overlapping fills or previous adherence issues etc)  Patient is due for next adherence delivery on: 06/28/20. Called  patient and reviewed medications and coordinated delivery.  This delivery to include: Anoro Ellipt inhale 1 puff by mouth daily Gabapentin 300mg  take 2 caps every morning, two caps in afternoon, and three caps at bedtime Zolpidem tartrate 5 mg 1 tab by mouth everyday at bedtime as needed   Confirmed delivery date of 06/28/20, advised patient that pharmacy will contact them the morning of delivery.   Wendy Poet, [place] [phone]   Time spent:30

## 2020-06-24 ENCOUNTER — Other Ambulatory Visit: Payer: Self-pay | Admitting: Internal Medicine

## 2020-06-29 ENCOUNTER — Ambulatory Visit (INDEPENDENT_AMBULATORY_CARE_PROVIDER_SITE_OTHER): Payer: PPO | Admitting: Internal Medicine

## 2020-06-29 ENCOUNTER — Encounter: Payer: Self-pay | Admitting: Internal Medicine

## 2020-06-29 ENCOUNTER — Other Ambulatory Visit: Payer: Self-pay

## 2020-06-29 DIAGNOSIS — M79672 Pain in left foot: Secondary | ICD-10-CM

## 2020-06-29 NOTE — Progress Notes (Signed)
° °  Subjective:   Patient ID: Sheila Morales, female    DOB: 05-Jul-1950, 70 y.o.   MRN: [id number]  HPI The patient is a 70 YO female coming in for left foot pain. Has a knot on the bottom of the foot behind toes. Hurts when she walks or stands for a long time. Overall is stable and she is not sure if certain shoes could help.   Review of Systems  Constitutional: Negative.   HENT: Negative.   Eyes: Negative.   Respiratory: Negative for cough, chest tightness and shortness of breath.   Cardiovascular: Negative for chest pain, palpitations and leg swelling.  Gastrointestinal: Negative for abdominal distention, abdominal pain, constipation, diarrhea, nausea and vomiting.  Musculoskeletal: Positive for myalgias.  Skin: Negative.   Neurological: Negative.   Psychiatric/Behavioral: Negative.     Objective:  Physical Exam Constitutional:      Appearance: She is well-developed.  HENT:     Head: Normocephalic and atraumatic.  Cardiovascular:     Rate and Rhythm: Normal rate and regular rhythm.  Pulmonary:     Effort: Pulmonary effort is normal. No respiratory distress.     Breath sounds: Normal breath sounds. No wheezing or rales.  Abdominal:     General: Bowel sounds are normal. There is no distension.     Palpations: Abdomen is soft.     Tenderness: There is no abdominal tenderness. There is no rebound.  Musculoskeletal:     Cervical back: Normal range of motion.     Comments: Corn/callusing bottom left foot  Skin:    General: Skin is warm and dry.  Neurological:     Mental Status: She is alert and oriented to person, place, and time.     Coordination: Coordination normal.     Vitals:   06/29/20 0916  BP: 112/60  Pulse: 62  Resp: 18  Temp: 97.8 F (36.6 C)  TempSrc: Oral  Weight: 130 lb 9.6 oz (59.2 kg)  Height: 5\' 2"  (1.575 m)    This visit occurred during the SARS-CoV-2 public health emergency.  Safety protocols were in place, including screening questions prior to  the visit, additional usage of staff PPE, and extensive cleaning of exam room while observing appropriate contact time as indicated for disinfecting solutions.   Assessment & Plan:  Visit time 15 minutes in face to face communication with patient and coordination of care, additional 5 minutes spent in record review, coordination or care, ordering tests, communicating/referring to other healthcare professionals, documenting in medical records all on the same day of the visit for total time 20 minutes spent on the visit.

## 2020-06-29 NOTE — Patient Instructions (Signed)
You can use the scraper on the spot on the foot.

## 2020-06-30 ENCOUNTER — Encounter: Payer: Self-pay | Admitting: Internal Medicine

## 2020-06-30 DIAGNOSIS — M79672 Pain in left foot: Secondary | ICD-10-CM | POA: Insufficient documentation

## 2020-06-30 NOTE — Assessment & Plan Note (Signed)
Can try otc inserts and okay to file that area.

## 2020-07-03 ENCOUNTER — Telehealth: Payer: Self-pay | Admitting: Internal Medicine

## 2020-07-03 NOTE — Telephone Encounter (Signed)
See below

## 2020-07-03 NOTE — Telephone Encounter (Signed)
Patient called and said that she mentioned a bowel issues to Dr. Sharlet Salina at her last visit on 3.10.22. She was wondering if Dr. Sharlet Salina was able to send a message to Dr. Carlean Purl. She can be reached at [phone]. Please advise.

## 2020-07-03 NOTE — Telephone Encounter (Signed)
We did discuss her stomach briefly. I did not send a message to Dr. Carlean Purl but she should follow up with him.

## 2020-07-04 NOTE — Telephone Encounter (Signed)
Patient made aware to contact Dr Carlean Purl

## 2020-07-18 ENCOUNTER — Telehealth: Payer: Self-pay | Admitting: Internal Medicine

## 2020-07-18 NOTE — Telephone Encounter (Signed)
Patient called states she has a lot of abdominal issues has be taking laxitives and they are not helping feels like there is a blockage.

## 2020-07-19 NOTE — Telephone Encounter (Signed)
Patient reports continued chronic constipation. She is advised to continue the miralax, but increase to daily.  She is asking about starting a fiber supplement.  She is advised to start with half a dose and slowly increase as tolerated.  She is reminded she needs to drink plenty of fluids.  She is scheduled to see Tye Savoy RNP on 08/01/20

## 2020-07-20 NOTE — Progress Notes (Signed)
Cardiology Office Note:    Date:  07/21/2020   ID:  Sheila Morales, DOB [birth year], MRN [id number]  PCP:  Hoyt Koch, MD  Cardiologist:  No primary care provider on file.  Electrophysiologist:  None   Referring MD: Hoyt Koch, *   Chief Complaint/Reason for Referral: Bradycardia, dizziness s/p PPM  History of Present Illness:    Sheila Morales is a 70 y.o. female with a history of COPD, SLE on methorexate, BPPV, and HTN who presents for follow up of dizziness and bradycardia.  Her husband [name] joins her for the visit today.  Successful implantation of a Biotronik dual-chamber pacemaker for symptomatic bradycardia due to sinus node dysfunction 05/26/20. Normal device interrogation and wound check on 06/08/20.  Today she feels that she has more energy from a cardiac perspective.  She is however experiencing significant GI issues related to a history of a partial small bowel obstruction and the need to take MiraLAX twice a day.  She plans to see her PCP about this issue upcoming.  Denies chest pain, shortness of breath, palpitations, PND, orthopnea, leg swelling.  Denies syncope or presyncope.  Denies dizziness or lightheadedness.   Past Medical History:  Diagnosis Date  . Acute respiratory failure with hypoxia ([place]) 11/05/2017  . Arthritis   . Colitis, ischemic ([place])   . External hemorrhoids   . H/O: rheumatic fever   . IBS (irritable bowel syndrome)   . Personal history colonic adenoma 03/25/2008   06/2007 right sided adenoma and 2 right hyperplastic polyps    . Restless leg syndrome    questionable  . Systemic lupus erythematosus ([place])   . Tubular adenoma of colon   . 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 CHOLECYSTECTOMY    . CHOLECYSTECTOMY, LAPAROSCOPIC    . COLONOSCOPY    . ESOPHAGOGASTRODUODENOSCOPY    . lesion, vulva excision    . 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   Procedure: PACEMAKER IMPLANT;   Surgeon: [name], MD;  Location: [facility];  Service: Cardiovascular;  Laterality: N/A;  . TONSILLECTOMY    . VIDEO BRONCHOSCOPY Bilateral 11/06/2017   Procedure: VIDEO BRONCHOSCOPY WITHOUT FLUORO;  Surgeon: Marshell Garfinkel, MD;  Location: [facility];  Service: Cardiopulmonary;  Laterality: Bilateral;    Current Medications: Current Meds  Medication Sig  . albuterol (VENTOLIN HFA) 108 (90 Base) MCG/ACT inhaler Inhale 1-2 puffs into the lungs every 6 (six) hours as needed for wheezing or shortness of breath.  Marland Kitchen amLODipine (NORVASC) 2.5 MG tablet Take 1 tablet (2.5 mg total) by mouth daily.  Marland Kitchen aspirin 81 MG tablet Take 81 mg by mouth daily.  Marland Kitchen BLACK COHOSH PO Take 1 capsule by mouth daily.  . Calcium Carb-Cholecalciferol (CALCIUM 600 + D PO) Take 1 tablet by mouth daily.  . calcium carbonate (OS-CAL - DOSED IN MG OF ELEMENTAL CALCIUM) 1250 (500 Ca) MG tablet Take 1 tablet by mouth daily as needed (heartburn).  . Cholecalciferol (VITAMIN D) 50 MCG (2000 UT) tablet Take 2,000 Units by mouth daily.  Marland Kitchen dicyclomine (BENTYL) 10 MG capsule Take 1 capsule (10 mg total) by mouth 4 (four) times daily -  before meals and at bedtime.  Marland Kitchen doxazosin (CARDURA) 1 MG tablet Take 1 tablet (1 mg total) by mouth daily.  . folic acid (FOLVITE) 1 MG tablet Take 1 mg by mouth daily.  Marland Kitchen gabapentin (NEURONTIN) 300 MG capsule TAKE TWO  CAPSULES BY MOUTH EVERY MORNING, TWO capsules each afternoon AND THREE CAPSULES AT bedtime  . hydroxychloroquine (PLAQUENIL) 200 MG tablet Take 200 mg by mouth 2 (two) times daily.  Marland Kitchen ibuprofen (ADVIL) 200 MG tablet Take 400 mg by mouth every 6 (six) hours as needed for headache or mild pain.  Marland Kitchen loratadine (CLARITIN) 10 MG tablet Take 10 mg by mouth daily as needed for allergies.  . methotrexate 2.5 MG tablet Take 10 mg by mouth every Thursday.  . Misc Natural Products (ADVANCED JOINT RELIEF) CAPS Take 1 capsule by mouth daily. Joint Advantage Gold 5x  . Multiple Vitamin  (MULTIVITAMIN WITH MINERALS) TABS tablet Take 1 tablet by mouth daily.  . Nutritional Supplements (MENOPAUSE FORMULA) TABS Take 1 tablet by mouth daily.  . Omega-3 Fatty Acids (OMEGA 3 PO) Take 1 capsule by mouth daily.  Marland Kitchen OVER THE COUNTER MEDICATION Take 1 capsule by mouth daily. brain essentials otc supplement  . Polyethyl Glycol-Propyl Glycol (SYSTANE OP) Place 1 drop into both eyes 2 (two) times daily.  . RESTASIS 0.05 % ophthalmic emulsion Place 1 drop into both eyes 2 (two) times daily.  Marland Kitchen saccharomyces boulardii (FLORASTOR) 250 MG capsule Take 250 mg by mouth 2 (two) times daily.  Marland Kitchen telmisartan (MICARDIS) 40 MG tablet TAKE TWO TABLETS BY MOUTH ONCE DAILY  . triamcinolone cream (KENALOG) 0.1 % Apply 1 application topically 2 (two) times daily. (Patient taking differently: Apply 1 application topically 2 (two) times daily as needed (lupus flare).)  . triamcinolone ointment (KENALOG) 0.5 % Apply 1 application topically 2 (two) times daily. (Patient taking differently: Apply 1 application topically 2 (two) times daily as needed (lupus flare).)  . umeclidinium-vilanterol (ANORO ELLIPTA) 62.5-25 MCG/INH AEPB INHALE 1 PUFF BY MOUTH ONCE DAILY  . vitamin C (ASCORBIC ACID) 500 MG tablet Take 500 mg by mouth daily.  . vitamin E 180 MG (400 UNITS) capsule Take 400 Units by mouth daily.  Marland Kitchen zolpidem (AMBIEN) 5 MG tablet TAKE ONE TABLET BY MOUTH EVERYDAY AT BEDTIME AS NEEDED (Patient taking differently: Take 5 mg by mouth at bedtime.)     Allergies:   Patient has no known allergies.   Social History   Tobacco Use  . Smoking status: Former Smoker    Packs/day: 0.25    Years: 30.00    Pack years: 7.50    Types: Cigarettes    Quit date: 01/21/2020    Years since quitting: 0.4  . Smokeless tobacco: Never Used  . Tobacco comment: Declined info  Vaping Use  . Vaping Use: Never used  Substance Use Topics  . Alcohol use: Yes    Alcohol/week: 5.0 standard drinks    Types: 5 Standard drinks or  equivalent per week    Comment: 2 glasses of wine nightly, 4 beers nightly on weekend  . Drug use: No     Family History: The patient's family history includes Atrial fibrillation in her father; Colon cancer in her father; Healthy in her daughter; Other in her sister; Stroke in her mother.  ROS:   Please see the history of present illness.    All other systems reviewed and are negative.  EKGs/Labs/Other Studies Reviewed:    The following studies were reviewed today:  Recent Labs: 01/21/2020: ALT 29 05/11/2020: BUN 20; Creatinine, Ser 0.78; Hemoglobin 11.8; Platelets 153; Potassium 4.6; Sodium 139  Recent Lipid Panel    Component Value Date/Time   CHOL 148 03/29/2019 0909   TRIG 66.0 03/29/2019 0909   HDL 63.60 03/29/2019  0909   CHOLHDL 2 03/29/2019 0909   VLDL 13.2 03/29/2019 0909   LDLCALC 71 03/29/2019 0909   LDLDIRECT 52.0 02/24/2017 1351    Physical Exam:    VS:  BP 136/70 (BP Location: Right Arm, Patient Position: Sitting, Cuff Size: Normal)   Pulse 64   Ht 5\' 2"  (1.575 m)   Wt 133 lb 3.2 oz (60.4 kg)   BMI 24.36 kg/m     Wt Readings from Last 5 Encounters:  07/21/20 133 lb 3.2 oz (60.4 kg)  06/29/20 130 lb 9.6 oz (59.2 kg)  05/26/20 128 lb (58.1 kg)  05/11/20 129 lb 3.2 oz (58.6 kg)  04/24/20 131 lb 6.4 oz (59.6 kg)    Constitutional: No acute distress Eyes: sclera non-icteric, normal conjunctiva and lids ENMT: normal dentition, moist mucous membranes Cardiovascular: regular rhythm, normal rate, no murmurs. S1 and S2 normal. Radial pulses normal bilaterally. No jugular venous distention.  Left pectoral device site is well-healed, no induration, no erythema, no dehiscence. Respiratory: clear to auscultation bilaterally GI : normal bowel sounds, soft and nontender. No distention.   MSK: extremities warm, well perfused. No edema.  NEURO: grossly nonfocal exam, moves all extremities. PSYCH: alert and oriented x 3, normal mood and affect.   ASSESSMENT:    1.  Bradycardia   2. Dizziness   3. Sinus node dysfunction (HCC)   4. Fatigue, unspecified type   5. Pacemaker   6. Snoring   7. Primary hypertension   8. Chronic obstructive pulmonary disease, unspecified COPD type ([place])    PLAN:    Bradycardia Dizziness Sinus node dysfunction (HCC) Fatigue, unspecified type Pacemaker -Symptoms improved with implantation of pacemaker.  Last device check normal function.  Snoring-if she has a recurrence of fatigue and when her GI issues are better sorted out, I would recommend a sleep study.  Snores most of the nights of the week.  Primary hypertension-blood pressure borderline elevated today, but no changes recommended just quite yet, given active GI issues that are giving her symptoms.  Chronic obstructive pulmonary disease, unspecified COPD type (HCC) -Mild cough on exam today but no active respiratory issues.  Continue to follow with PCP.  Total time of encounter: 30 minutes total time of encounter, including 22 minutes spent in face-to-face patient care on the date of this encounter. This time includes coordination of care and counseling regarding above mentioned problem list. Remainder of non-face-to-face time involved reviewing chart documents/testing relevant to the patient encounter and documentation in the medical record. I have independently reviewed documentation from referring provider.   Cherlynn Kaiser, MD, [facility]    Medication Adjustments/Labs and Tests Ordered: Current medicines are reviewed at length with the patient today.  Concerns regarding medicines are outlined above.   No orders of the defined types were placed in this encounter.   No orders of the defined types were placed in this encounter.   Patient Instructions  Medication Instructions:  No Changes In Medications at this time. *If you need a refill on your cardiac medications before your next appointment, please call your  pharmacy*  Follow-Up: At [facility], you and your health needs are our priority.  As part of our continuing mission to provide you with exceptional heart care, we have created designated Provider Care Teams.  These Care Teams include your primary Cardiologist (physician) and Advanced Practice Providers (APPs -  Physician Assistants and Nurse Practitioners) who all work together to provide you with the care you need,  when you need it.  Your next appointment:   1 year(s)  The format for your next appointment:   In Person  Provider:   Cherlynn Kaiser, MD

## 2020-07-21 ENCOUNTER — Encounter: Payer: Self-pay | Admitting: Internal Medicine

## 2020-07-21 ENCOUNTER — Other Ambulatory Visit: Payer: Self-pay

## 2020-07-21 ENCOUNTER — Telehealth: Payer: Self-pay | Admitting: Pharmacist

## 2020-07-21 ENCOUNTER — Ambulatory Visit: Payer: PPO | Admitting: Internal Medicine

## 2020-07-21 VITALS — BP 136/70 | HR 64 | Ht 62.0 in | Wt 133.2 lb

## 2020-07-21 DIAGNOSIS — R0683 Snoring: Secondary | ICD-10-CM | POA: Diagnosis not present

## 2020-07-21 DIAGNOSIS — R5383 Other fatigue: Secondary | ICD-10-CM

## 2020-07-21 DIAGNOSIS — J449 Chronic obstructive pulmonary disease, unspecified: Secondary | ICD-10-CM | POA: Diagnosis not present

## 2020-07-21 DIAGNOSIS — Z950 Presence of cardiac pacemaker: Secondary | ICD-10-CM

## 2020-07-21 DIAGNOSIS — R001 Bradycardia, unspecified: Secondary | ICD-10-CM

## 2020-07-21 DIAGNOSIS — R42 Dizziness and giddiness: Secondary | ICD-10-CM

## 2020-07-21 DIAGNOSIS — I495 Sick sinus syndrome: Secondary | ICD-10-CM

## 2020-07-21 DIAGNOSIS — I10 Essential (primary) hypertension: Secondary | ICD-10-CM | POA: Diagnosis not present

## 2020-07-21 NOTE — Progress Notes (Signed)
Chronic Care Management Pharmacy Assistant   Name: Sheila Morales  MRN: [id number] DOB: 21-May-1950   Reason for Encounter: Medication Coordination Call    Recent office visits:  06/29/20 Dr. Sharlet Salina   Recent consult visits:  None ID  Hospital visits:  None in previous 6 months  Medications: Outpatient Encounter Medications as of 07/21/2020  Medication Sig Note  . albuterol (VENTOLIN HFA) 108 (90 Base) MCG/ACT inhaler Inhale 1-2 puffs into the lungs every 6 (six) hours as needed for wheezing or shortness of breath.   Marland Kitchen amLODipine (NORVASC) 2.5 MG tablet Take 1 tablet (2.5 mg total) by mouth daily. (Patient taking differently: Take 1.25 mg by mouth daily.)   . aspirin 81 MG tablet Take 81 mg by mouth daily.   Marland Kitchen BLACK COHOSH PO Take 1 capsule by mouth daily.   . Calcium Carb-Cholecalciferol (CALCIUM 600 + D PO) Take 1 tablet by mouth daily.   . calcium carbonate (OS-CAL - DOSED IN MG OF ELEMENTAL CALCIUM) 1250 (500 Ca) MG tablet Take 1 tablet by mouth daily as needed (heartburn).   . Cholecalciferol (VITAMIN D) 50 MCG (2000 UT) tablet Take 2,000 Units by mouth daily.   Marland Kitchen dicyclomine (BENTYL) 10 MG capsule Take 1 capsule (10 mg total) by mouth 4 (four) times daily -  before meals and at bedtime.   Marland Kitchen doxazosin (CARDURA) 1 MG tablet Take 1 tablet (1 mg total) by mouth daily.   . folic acid (FOLVITE) 1 MG tablet Take 1 mg by mouth daily.   Marland Kitchen gabapentin (NEURONTIN) 300 MG capsule TAKE TWO CAPSULES BY MOUTH EVERY MORNING, TWO capsules each afternoon AND THREE CAPSULES AT bedtime (Patient taking differently: Take 600-900 mg by mouth See admin instructions. Take 600 mg in the morning, 600 mg at lunch, and 900 mg at bedtime)   . hydroxychloroquine (PLAQUENIL) 200 MG tablet Take 200 mg by mouth 2 (two) times daily.   Marland Kitchen ibuprofen (ADVIL) 200 MG tablet Take 400 mg by mouth every 6 (six) hours as needed for headache or mild pain.   Marland Kitchen loratadine (CLARITIN) 10 MG tablet Take 10 mg by mouth daily as  needed for allergies.   . methotrexate 2.5 MG tablet Take 10 mg by mouth every Thursday.   . Misc Natural Products (ADVANCED JOINT RELIEF) CAPS Take 1 capsule by mouth daily. Joint Advantage Gold 5x   . Multiple Vitamin (MULTIVITAMIN WITH MINERALS) TABS tablet Take 1 tablet by mouth daily.   . Nutritional Supplements (MENOPAUSE FORMULA) TABS Take 1 tablet by mouth daily.   . Omega-3 Fatty Acids (OMEGA 3 PO) Take 1 capsule by mouth daily.   Marland Kitchen OVER THE COUNTER MEDICATION Take 1 capsule by mouth daily. brain essentials otc supplement   . Polyethyl Glycol-Propyl Glycol (SYSTANE OP) Place 1 drop into both eyes 2 (two) times daily.   . RESTASIS 0.05 % ophthalmic emulsion Place 1 drop into both eyes 2 (two) times daily.   Marland Kitchen saccharomyces boulardii (FLORASTOR) 250 MG capsule Take 250 mg by mouth 2 (two) times daily.   Marland Kitchen telmisartan (MICARDIS) 40 MG tablet TAKE TWO TABLETS BY MOUTH ONCE DAILY   . triamcinolone cream (KENALOG) 0.1 % Apply 1 application topically 2 (two) times daily. (Patient taking differently: Apply 1 application topically 2 (two) times daily as needed (lupus flare).) 05/18/2020: Pt has both ointment and cream  . triamcinolone ointment (KENALOG) 0.5 % Apply 1 application topically 2 (two) times daily. (Patient taking differently: Apply 1 application topically 2 (two)  times daily as needed (lupus flare).) 05/18/2020: Pt has both ointment and cream  . umeclidinium-vilanterol (ANORO ELLIPTA) 62.5-25 MCG/INH AEPB INHALE 1 PUFF BY MOUTH ONCE DAILY   . vitamin C (ASCORBIC ACID) 500 MG tablet Take 500 mg by mouth daily.   . vitamin E 180 MG (400 UNITS) capsule Take 400 Units by mouth daily.   Marland Kitchen zolpidem (AMBIEN) 5 MG tablet TAKE ONE TABLET BY MOUTH EVERYDAY AT BEDTIME AS NEEDED (Patient taking differently: Take 5 mg by mouth at bedtime.)    No facility-administered encounter medications on file as of 07/21/2020.      Reviewed chart for medication changes ahead of medication coordination  call.  No OVs, Consults, or hospital visits since last care coordination call/Pharmacist visit. (If appropriate, list visit date, provider name)  No medication changes indicated OR if recent visit, treatment plan here.  BP Readings from Last 3 Encounters:  06/29/20 112/60  05/26/20 (!) 140/52  05/11/20 126/62    Lab Results  Component Value Date   HGBA1C 5.7 02/24/2017     Patient obtains medications through Vials  90 Days   Last adherence delivery included:  Anoro Ellipt inhale 1 puff by mouth daily Gabapentin 300mg  take 2 caps every morning, two caps in afternoon, and three caps at bedtime Zolpidem tartrate 5 mg 1 tab by mouth everyday at bedtime as needed   Patient declined     Anoro Ellipt 06/18/20 patient has enough until March Amlodipine 08/17/20 Hydroxychloroquine 08/17/20 Telmisartan 08/17/20 Doxazosin 08/17/20 Gabapentin 07/02/20     last month due to PRN use/additional supply on hand. Explanation of abundance on hand (ie #30 due to overlapping fills or previous adherence issues etc)  Patient is due for next adherence delivery on: 07/28/20. Called patient and reviewed medications and coordinated delivery.  This delivery to include: Anoro Ellipt inhale 1 puff by mouth daily Zolpidem tartrate 5 mg 1 tab by mouth everyday at bedtime as needed   Patient declined the following medications   Amlodipine 08/17/20 Hydroxychloroquine 08/17/20 Telmisartan 08/17/20 Doxazosin 08/17/20 Gabapentin 09/26/20     Patient needs refills for Anoro Ellipt inhale 1 puff by mouth daily from Dr. Brock Ra   Confirmed delivery date of 07/28/20, advised patient that pharmacy will contact them the morning of delivery.    Star Rating Drugs: Telmisartan 40 mg take two tablets once daily   Ethelene Hal Clinical Pharmacist Assistant [phone]  Time spent:21

## 2020-07-21 NOTE — Patient Instructions (Signed)

## 2020-07-24 ENCOUNTER — Other Ambulatory Visit: Payer: Self-pay | Admitting: Emergency Medicine

## 2020-07-25 ENCOUNTER — Other Ambulatory Visit: Payer: Self-pay

## 2020-07-25 ENCOUNTER — Ambulatory Visit (INDEPENDENT_AMBULATORY_CARE_PROVIDER_SITE_OTHER): Payer: PPO | Admitting: Pharmacist

## 2020-07-25 DIAGNOSIS — I10 Essential (primary) hypertension: Secondary | ICD-10-CM

## 2020-07-25 DIAGNOSIS — J449 Chronic obstructive pulmonary disease, unspecified: Secondary | ICD-10-CM | POA: Diagnosis not present

## 2020-07-25 NOTE — Progress Notes (Cosign Needed)
Chronic Care Management Pharmacy Note  07/26/2020 Name:  Sheila Morales MRN:  [id number] DOB:  [birth year]  Subjective: Sheila Morales is an 70 y.o. year old female who is a primary patient of Hoyt Koch, MD.  The CCM team was consulted for assistance with disease management and care coordination needs.    Engaged with patient by telephone for follow up visit in response to provider referral for pharmacy case management and/or care coordination services.   Consent to Services:  The patient was given information about Chronic Care Management services, agreed to services, and gave verbal consent prior to initiation of services.  Please see initial visit note for detailed documentation.   Patient Care Team: Hoyt Koch, MD as PCP - General (Internal Medicine) Elouise Munroe, MD as PCP - Cardiology (Cardiology) Gavin Pound, MD (Rheumatology) Gus Height, MD (Inactive) (Obstetrics and Gynecology) Alda Berthold, DO as Consulting Physician (Neurology) Charlton Haws, [facility] as Pharmacist (Pharmacist)  Recent office visits: 06/29/20 Dr Sharlet Salina OV: L foot pain. Try inserts, filing.  Recent consult visits: 07/21/20 Dr [name] (cardiology): f/u PPM, pt has more energy but having GI issues. Advised to f/u with PCP. No changes.  07/18/20 GI phone call: c/o chronic constipation. Advised to increase Miralax to daily, start a fiber supplement.  05/26/20 Pacemaker procedure  05/11/20 Dr Lovena Le (cardiology): stopped smoking x 4 months.   04/24/20 Dr [name] (cardiology): referred to EP for pacemaker consult. Plan sleep study. Continue same BP meds  03/23/20 Dr Trudie Reed (rheumatology): f/u lupus  Hospital visits: None in previous 6 months  Objective:  Lab Results  Component Value Date   CREATININE 0.78 05/11/2020   BUN 20 05/11/2020   GFR 62.66 01/21/2020   GFRNONAA 77 05/11/2020   GFRAA 89 05/11/2020   NA 139 05/11/2020   K 4.6 05/11/2020   CALCIUM 9.5 05/11/2020    CO2 30 (H) 05/11/2020   GLUCOSE 80 05/11/2020    Lab Results  Component Value Date/Time   HGBA1C 5.7 02/24/2017 01:51 PM   GFR 62.66 01/21/2020 11:35 AM   GFR 82.87 03/29/2019 09:09 AM    Last diabetic Eye exam: No results found for: HMDIABEYEEXA  Last diabetic Foot exam: No results found for: HMDIABFOOTEX   Lab Results  Component Value Date   CHOL 148 03/29/2019   HDL 63.60 03/29/2019   LDLCALC 71 03/29/2019   LDLDIRECT 52.0 02/24/2017   TRIG 66.0 03/29/2019   CHOLHDL 2 03/29/2019    Hepatic Function Latest Ref Rng & Units 01/21/2020 11/22/2019 03/29/2019  Total Protein 6.0 - 8.3 g/dL 7.5 7.5 6.8  Albumin 3.5 - 5.2 g/dL 4.1 - 3.7  AST 0 - 37 U/L 33 35 33  ALT 0 - 35 U/L 29 33(H) 27  Alk Phosphatase 39 - 117 U/L 77 - 88  Total Bilirubin 0.2 - 1.2 mg/dL 0.6 0.4 0.6    Lab Results  Component Value Date/Time   TSH 1.45 03/29/2019 09:09 AM   TSH 1.10 01/24/2015 11:49 AM    CBC Latest Ref Rng & Units 05/11/2020 01/21/2020 11/22/2019  WBC 3.4 - 10.8 x10E3/uL 4.2 4.2 4.0  Hemoglobin 11.1 - 15.9 g/dL 11.8 13.1 14.7  Hematocrit 34.0 - 46.6 % 35.0 38.6 42.6  Platelets 150 - 450 x10E3/uL 153 206.0 165    Lab Results  Component Value Date/Time   VD25OH 78.74 03/29/2019 09:09 AM    Clinical ASCVD: No  The 10-year ASCVD risk score Mikey Bussing DC Jr., et al., 2013) is:  12.6%   Values used to calculate the score:     Age: 23 years     Sex: Female     Is Non-Hispanic African American: No     Diabetic: No     Tobacco smoker: No     Systolic Blood Pressure: 790 mmHg     Is BP treated: Yes     HDL Cholesterol: 63.6 mg/dL     Total Cholesterol: 148 mg/dL    Depression screen [facility] 2/9 04/03/2020 09/27/2019 02/24/2017  Decreased Interest 0 0 0  Down, Depressed, Hopeless 1 0 0  PHQ - 2 Score 1 0 0  Some recent data might be hidden     Social History   Tobacco Use  Smoking Status Former Smoker  . Packs/day: 0.25  . Years: 30.00  . Pack years: 7.50  . Types: Cigarettes  . Quit date:  01/21/2020  . Years since quitting: 0.5  Smokeless Tobacco Never Used  Tobacco Comment   Declined info   BP Readings from Last 3 Encounters:  07/21/20 136/70  06/29/20 112/60  05/26/20 (!) 140/52   Pulse Readings from Last 3 Encounters:  07/21/20 64  06/29/20 62  05/26/20 61   Wt Readings from Last 3 Encounters:  07/21/20 133 lb 3.2 oz (60.4 kg)  06/29/20 130 lb 9.6 oz (59.2 kg)  05/26/20 128 lb (58.1 kg)   BMI Readings from Last 3 Encounters:  07/21/20 24.36 kg/m  06/29/20 23.89 kg/m  05/26/20 23.41 kg/m    Assessment/Interventions: Review of patient past medical history, allergies, medications, health status, including review of consultants reports, laboratory and other test data, was performed as part of comprehensive evaluation and provision of chronic care management services.   SDOH:  (Social Determinants of Health) assessments and interventions performed: Yes  SDOH Screenings   Alcohol Screen: Low Risk   . Last Alcohol Screening Score (AUDIT): 0  Depression (PHQ2-9): Low Risk   . PHQ-2 Score: 1  Financial Resource Strain: Low Risk   . Difficulty of Paying Living Expenses: Not hard at all  Food Insecurity: No Food Insecurity  . Worried About Charity fundraiser in the Last Year: Never true  . Ran Out of Food in the Last Year: Never true  Housing: Low Risk   . Last Housing Risk Score: 0  Physical Activity: Sufficiently Active  . Days of Exercise per Week: 5 days  . Minutes of Exercise per Session: 30 min  Social Connections: Moderately Integrated  . Frequency of Communication with Friends and Family: More than three times a week  . Frequency of Social Gatherings with Friends and Family: More than three times a week  . Attends Religious Services: More than 4 times per year  . Active Member of Clubs or Organizations: No  . Attends Archivist Meetings: Never  . Marital Status: Married  Stress: No Stress Concern Present  . Feeling of Stress : Not at  all  Tobacco Use: Medium Risk  . Smoking Tobacco Use: Former Smoker  . Smokeless Tobacco Use: Never Used  Transportation Needs: No Transportation Needs  . Lack of Transportation (Medical): No  . Lack of Transportation (Non-Medical): No    CCM Care Plan  No Known Allergies  Medications Reviewed Today    Reviewed by Georgina Pillion, [place] (Certified Medical Assistant) on 07/21/20 at [phone]  Med List Status: <None>  Medication Order Taking? Sig Documenting Provider Last Dose Status Informant  albuterol (VENTOLIN HFA) 108 (90 Base) MCG/ACT inhaler [id number]  Yes Inhale 1-2 puffs into the lungs every 6 (six) hours as needed for wheezing or shortness of breath. Collene Gobble, MD Taking Active Self  amLODipine (NORVASC) 2.5 MG tablet [id number] Yes Take 1 tablet (2.5 mg total) by mouth daily. Hoyt Koch, MD Taking Active   aspirin 81 MG tablet [id number] Yes Take 81 mg by mouth daily. [provider] Taking Active Self  BLACK COHOSH PO [id number] Yes Take 1 capsule by mouth daily. [provider] Taking Active Self  Calcium Carb-Cholecalciferol (CALCIUM 600 + D PO) [id number] Yes Take 1 tablet by mouth daily. [provider] Taking Active Self  calcium carbonate (OS-CAL - DOSED IN MG OF ELEMENTAL CALCIUM) 1250 (500 Ca) MG tablet [id number] Yes Take 1 tablet by mouth daily as needed (heartburn). [provider] Taking Active Self  Cholecalciferol (VITAMIN D) 50 MCG (2000 UT) tablet [id number] Yes Take 2,000 Units by mouth daily. [provider] Taking Active Self  dicyclomine (BENTYL) 10 MG capsule [id number] Yes Take 1 capsule (10 mg total) by mouth 4 (four) times daily -  before meals and at bedtime. Hoyt Koch, MD Taking Active Self  doxazosin (CARDURA) 1 MG tablet [id number] Yes Take 1 tablet (1 mg total) by mouth daily. Hoyt Koch, MD Taking Active Self  folic acid (FOLVITE) 1 MG tablet [id number] Yes Take 1 mg by mouth daily.  [provider] Taking Active Self  gabapentin (NEURONTIN) 300 MG capsule [id number] Yes TAKE TWO CAPSULES BY MOUTH EVERY MORNING, TWO capsules each afternoon AND THREE CAPSULES AT bedtime Hoyt Koch, MD Taking Active   hydroxychloroquine (PLAQUENIL) 200 MG tablet [id number] Yes Take 200 mg by mouth 2 (two) times daily. [provider] Taking Active Self  ibuprofen (ADVIL) 200 MG tablet [id number] Yes Take 400 mg by mouth every 6 (six) hours as needed for headache or mild pain. [provider] Taking Active Self  loratadine (CLARITIN) 10 MG tablet [id number] Yes Take 10 mg by mouth daily as needed for allergies. [provider] Taking Active Self  methotrexate 2.5 MG tablet [id number] Yes Take 10 mg by mouth every Thursday. [provider] Taking Active Self  Misc Natural Products (ADVANCED JOINT RELIEF) CAPS [id number] Yes Take 1 capsule by mouth daily. Joint Advantage Gold 5x [provider] Taking Active Self  Multiple Vitamin (MULTIVITAMIN WITH MINERALS) TABS tablet [id number] Yes Take 1 tablet by mouth daily. [provider] Taking Active Self  Nutritional Supplements (MENOPAUSE FORMULA) TABS [id number] Yes Take 1 tablet by mouth daily. [provider] Taking Active Self  Omega-3 Fatty Acids (OMEGA 3 PO) [id number] Yes Take 1 capsule by mouth daily. [provider] Taking Active Self  OVER THE COUNTER MEDICATION [id number] Yes Take 1 capsule by mouth daily. brain essentials otc supplement [provider] Taking Active Self  Polyethyl Glycol-Propyl Glycol (SYSTANE OP) [id number] Yes Place 1 drop into both eyes 2 (two) times daily. [provider] Taking Active Self  RESTASIS 0.05 % ophthalmic emulsion [id number] Yes Place 1 drop into both eyes 2 (two) times daily. [provider] Taking Active Self  saccharomyces boulardii (FLORASTOR) 250 MG capsule [id number] Yes Take 250 mg by mouth 2 (two)  times daily. [provider] Taking Active Self  telmisartan (MICARDIS) 40 MG tablet [id number] Yes TAKE TWO TABLETS BY MOUTH ONCE DAILY Burns, Claudina Lick, MD Taking Active   triamcinolone cream (KENALOG) 0.1 % [id number] Yes Apply 1 application topically 2 (two) times daily.  Patient taking differently:  Apply 1 application topically 2 (two) times daily as needed (lupus flare).   Hoyt Koch, MD Taking Active            Med Note Caryn Section, [place] A   Thu May 18, 2020  9:51 AM) Pt has both ointment and cream  triamcinolone ointment (KENALOG) 0.5 % [id number] Yes Apply 1 application topically 2 (two) times daily.  Patient taking differently: Apply 1 application topically 2 (two) times daily as needed (lupus flare).   Hall-Potvin, [place], PA-C Taking Active            Med Note Caryn Section, [place] A   Thu May 18, 2020  9:51 AM) Pt has both ointment and cream  umeclidinium-vilanterol (ANORO ELLIPTA) 62.5-25 MCG/INH AEPB [id number] Yes INHALE 1 PUFF BY MOUTH ONCE DAILY Byrum, Rose Fillers, MD Taking Active   vitamin C (ASCORBIC ACID) 500 MG tablet [id number] Yes Take 500 mg by mouth daily. [provider] Taking Active Self  vitamin E 180 MG (400 UNITS) capsule [id number] Yes Take 400 Units by mouth daily. [provider] Taking Active Self  zolpidem (AMBIEN) 5 MG tablet [id number] Yes TAKE ONE TABLET BY MOUTH EVERYDAY AT BEDTIME AS NEEDED  Patient taking differently: Take 5 mg by mouth at bedtime.   Biagio Borg, MD Taking Active           Patient Active Problem List   Diagnosis Date Noted  . Left foot pain 06/30/2020  . Sinus node dysfunction ([place]) 05/26/2020  . Bradycardia 05/11/2020  . Pain in right lower leg 02/18/2020  . Episodic lightheadedness 02/02/2020  . Hypertension 02/02/2020  . LLQ pain 11/23/2019  . Cyst of buttocks 10/05/2019  . Methotrexate, long term, current use 07/29/2019  . BPPV (benign paroxysmal positional vertigo) 12/16/2017  . Insomnia 02/24/2017   . COPD (chronic obstructive pulmonary disease) ([place]) 01/22/2017  . Routine general medical examination at a health care facility 02/19/2016  . Hereditary and idiopathic peripheral neuropathy 02/17/2015  . Smokers' cough ([place]) 12/07/2014  . Systemic lupus erythematosus ([place]) 10/13/2013  . Cough 01/18/2009  . Rash 05/31/2008  . Irritable bowel syndrome 03/25/2008  . RHEUMATIC FEVER, HX OF 01/17/2007    Immunization History  Administered Date(s) Administered  . Fluad Quad(high Dose 65+) 03/29/2019  . H1N1 05/31/2008  . Influenza Split 02/13/2012  . Influenza Whole 01/21/2008, 01/17/2009  . Influenza, High Dose Seasonal PF 02/19/2016, 02/05/2018  . Influenza,inj,Quad PF,6+ Mos 01/21/2014  . Influenza-Unspecified 02/02/2015, 02/03/2017  . Pneumococcal Conjugate-13 02/19/2016  . Pneumococcal Polysaccharide-23 02/24/2017  . Tdap 03/29/2019    Conditions to be addressed/monitored:  Hypertension, COPD, Tobacco use and Lupus, IBS  Care Plan : [place]  Updates made by Charlton Haws, [place] since 07/26/2020 12:00 AM    Problem: Hypertension, COPD, Tobacco use and Lupus, IBS   Priority: High    Long-Range Goal: Disease management   Start Date: 07/25/2020  Expected End Date: 01/25/2021  This Visit's Progress: On track  Priority: High  Note:   Current Barriers:  . Unable to independently monitor therapeutic efficacy  Pharmacist Clinical Goal(s):  Marland Kitchen Patient will achieve adherence to monitoring guidelines and medication adherence to achieve therapeutic efficacy through collaboration with PharmD and provider.   Interventions: . 1:1 collaboration with Hoyt Koch, MD regarding development and update of comprehensive plan of care as evidenced by provider attestation and co-signature . Inter-disciplinary care team collaboration (see longitudinal plan of care) . Comprehensive medication review performed; medication list updated in  electronic medical  record  Hypertension    BP goal is:  <130/80 Patient checks BP at home daily Patient home BP readings are ranging: 130s/80s S/p Pacemaker 05/2020   Patient has failed these meds in the past: n/a Patient is currently controlled on the following medications:   Amlodipine 2.5 mg BID  Telmisartan 40 mg - 2 tab daily  Doxazosin 1 mg daily   We discussed: BP goals; BP/HR issues have improved since pacemaker insertion in Feb 2022   Plan: Continue current medications and control with diet and exercise    COPD    Last spirometry score: FEV1/FVC 0.59, FEV1 57% predicted Gold Grade: Gold 2 (FEV1 50-79%) Current COPD Classification:  A (low sx, <2 exacerbations/yr)  Patient is currently controlled on the following medications:   Anoro  (umeclidinium/vilatnerol) 1 puff daily,   albuterol HFA prn   Using maintenance inhaler regularly? Yes Frequency of rescue inhaler use:  multiple times per day -cleans houses, uses albuterol in AM and PM    We discussed:  proper inhaler technique; pt is currently out of refills, informed her she needs to make appt with pulmonary for more refills   Plan: Continue current medications    Tobacco Abuse    Previous quit attempts included: nicotine patch (didn't help much), nicotine gum ("tore up my throat"), Chantix (upset stomach)   We discussed: pt successfully quit smoking fall 2021 with bupropion; bupropion was discontinued Jan 2022   Plan: Continue to avoid triggers   Lupus    Patient has failed these meds in past: n/a Patient is currently controlled on the following medications:   methotrexate 10 mg (2.5 mg x 4) once a week,   folic acid 1 mg daily,   hydroxychloroquine 200 mg BID   We discussed:  Since starting MTX she has not had a lupus outbreak. Endorses compliance with daily folic acid and weekly MTX.   Plan: Continue current medications   IBS - C    Hx small bowel obsruction.  Patient has failed these meds in past:  dicyclomine Patient is currently controlled on the following medications:   Florastor probiotic    Benefiber prn  Dicyclomine 10 mg QID prn  Miralax BID   We discussed: Patient is satisfied with current regimen and denies issues   Plan: Continue current medications   Patient Goals/Self-Care Activities . Patient will:  - take medications as prescribed focus on medication adherence by pill box  -make appt with pumonary for Anoro refills  Follow Up Plan: Telephone follow up appointment with care management team member scheduled for: 6 months      Medication Assistance: None required.  Patient affirms current coverage meets needs.  Patient's preferred pharmacy is:  [facility] - [place], [place] - [address] [place] [address] Phone: [phone] Fax: [phone]  [id number]Phone: [phone]: [phone] Uses pill box? Yes Pt endorses 100% compliance  We discussed: Current pharmacy is preferred with insurance plan and patient is satisfied with pharmacy services Patient decided to: Utilize [facility] for medication synchronization, packaging and delivery  Care Plan and Follow Up Patient Decision:  Patient agrees to Care Plan and Follow-up.  Plan: Telephone follow up appointment with care management team member scheduled for:  6 months  LCharlene Brooke PharmD, BFl[place] CPP Clinical Pharmacist [facility] at [phone]

## 2020-07-26 NOTE — Patient Instructions (Signed)
Visit Information  Phone number for Pharmacist: [phone]  Goals Addressed   None    Patient Care Plan: CCM Pharmacy Care Plan    Problem Identified: Hypertension, COPD, Tobacco use and Lupus, IBS   Priority: High    Long-Range Goal: Disease management   Start Date: 07/25/2020  Expected End Date: 01/25/2021  This Visit's Progress: On track  Priority: High  Note:   Current Barriers:  . Unable to independently monitor therapeutic efficacy  Pharmacist Clinical Goal(s):  Marland Kitchen Patient will achieve adherence to monitoring guidelines and medication adherence to achieve therapeutic efficacy through collaboration with PharmD and provider.   Interventions: . 1:1 collaboration with Hoyt Koch, MD regarding development and update of comprehensive plan of care as evidenced by provider attestation and co-signature . Inter-disciplinary care team collaboration (see longitudinal plan of care) . Comprehensive medication review performed; medication list updated in electronic medical record  Hypertension    BP goal is:  <130/80 Patient checks BP at home daily Patient home BP readings are ranging: 130s/80s S/p Pacemaker 05/2020   Patient has failed these meds in the past: n/a Patient is currently controlled on the following medications:   Amlodipine 2.5 mg BID  Telmisartan 40 mg - 2 tab daily  Doxazosin 1 mg daily   We discussed: BP goals; BP/HR issues have improved since pacemaker insertion in Feb 2022   Plan: Continue current medications and control with diet and exercise    COPD    Last spirometry score: FEV1/FVC 0.59, FEV1 57% predicted Gold Grade: Gold 2 (FEV1 50-79%) Current COPD Classification:  A (low sx, <2 exacerbations/yr)  Patient is currently controlled on the following medications:   Anoro  (umeclidinium/vilatnerol) 1 puff daily,   albuterol HFA prn   Using maintenance inhaler regularly? Yes Frequency of rescue inhaler use:  multiple times per  day -cleans houses, uses albuterol in AM and PM    We discussed:  proper inhaler technique; pt is currently out of refills, informed her she needs to make appt with pulmonary for more refills   Plan: Continue current medications    Tobacco Abuse    Previous quit attempts included: nicotine patch (didn't help much), nicotine gum ("tore up my throat"), Chantix (upset stomach)   We discussed: pt successfully quit smoking fall 2021 with bupropion; bupropion was discontinued Jan 2022   Plan: Continue to avoid triggers   Lupus    Patient has failed these meds in past: n/a Patient is currently controlled on the following medications:   methotrexate 10 mg (2.5 mg x 4) once a week,   folic acid 1 mg daily,   hydroxychloroquine 200 mg BID   We discussed:  Since starting MTX she has not had a lupus outbreak. Endorses compliance with daily folic acid and weekly MTX.   Plan: Continue current medications   IBS - C    Hx small bowel obsruction.  Patient has failed these meds in past: dicyclomine Patient is currently controlled on the following medications:   Florastor probiotic    Benefiber prn  Dicyclomine 10 mg QID prn  Miralax BID   We discussed: Patient is satisfied with current regimen and denies issues   Plan: Continue current medications   Patient Goals/Self-Care Activities . Patient will:  - take medications as prescribed focus on medication adherence by pill box  -make appt with pumonary for Anoro refills  Follow Up Plan: Telephone follow up appointment with care management team member scheduled for: 6 months  Patient verbalizes understanding of instructions provided today and agrees to view in [place].  Telephone follow up appointment with pharmacy team member scheduled for: 6 months  Charlene Brooke, PharmD, [place], CPP Clinical Pharmacist [facility] [facility] [phone]

## 2020-07-27 ENCOUNTER — Other Ambulatory Visit: Payer: Self-pay | Admitting: Emergency Medicine

## 2020-07-28 DIAGNOSIS — H353111 Nonexudative age-related macular degeneration, right eye, early dry stage: Secondary | ICD-10-CM | POA: Diagnosis not present

## 2020-07-28 DIAGNOSIS — H31011 Macula scars of posterior pole (postinflammatory) (post-traumatic), right eye: Secondary | ICD-10-CM | POA: Diagnosis not present

## 2020-07-28 DIAGNOSIS — H04123 Dry eye syndrome of bilateral lacrimal glands: Secondary | ICD-10-CM | POA: Diagnosis not present

## 2020-07-28 DIAGNOSIS — Z79899 Other long term (current) drug therapy: Secondary | ICD-10-CM | POA: Diagnosis not present

## 2020-08-01 ENCOUNTER — Encounter: Payer: Self-pay | Admitting: Nurse Practitioner

## 2020-08-01 ENCOUNTER — Ambulatory Visit: Payer: PPO | Admitting: Nurse Practitioner

## 2020-08-01 ENCOUNTER — Other Ambulatory Visit (INDEPENDENT_AMBULATORY_CARE_PROVIDER_SITE_OTHER): Payer: PPO

## 2020-08-01 VITALS — BP 120/60 | HR 64 | Ht 62.0 in | Wt 131.0 lb

## 2020-08-01 DIAGNOSIS — R1084 Generalized abdominal pain: Secondary | ICD-10-CM

## 2020-08-01 DIAGNOSIS — K5900 Constipation, unspecified: Secondary | ICD-10-CM | POA: Diagnosis not present

## 2020-08-01 LAB — TSH: TSH: 0.99 u[IU]/mL (ref 0.35–4.50)

## 2020-08-01 MED ORDER — LUBIPROSTONE 24 MCG PO CAPS: 24.0000 ug | ORAL_CAPSULE | Freq: Two times a day (BID) | ORAL | 0 refills | Status: DC

## 2020-08-01 NOTE — Patient Instructions (Signed)
If you are age 71 or older, your body mass index should be between 23-30. Your Body mass index is 23.96 kg/m. If this is out of the aforementioned range listed, please consider follow up with your Primary Care Provider.  If you are age 86 or younger, your body mass index should be between 19-25. Your Body mass index is 23.96 kg/m. If this is out of the aformentioned range listed, please consider follow up with your Primary Care Provider.   Your provider has requested that you go to the basement level for lab work before leaving today. Press "B" on the elevator. The lab is located at the first door on the left as you exit the elevator.  START Amitiza (Lubiprostone) 24 mcg 1 capsule twice daily. This has been sent to your pharmacy.  Be sure to check your Joint relief supplement doesn't contain Calcium.  Keep drinking at least 60 ounces of water.  Call the office in 2 weeks and ask to speak with Paula's nurse, Beth to update [place] on how you are doing.  Follow up as needed.  Thank you for entrusting me with your care and choosing [facility].  Tye Savoy, NP-C

## 2020-08-01 NOTE — Progress Notes (Signed)
ASSESSMENT AND PLAN    #70 year old female with IBS presenting with generalized abdominal pain , bloating and constipation defined as decreased urge /sensation of incomplete evacuation .  She has been having variable responses to MiraLAX.  Patient frustrated that she rely on a medication to have a bowel movement.  --Together we reviewed her extensive home medication list .  Several of her medications have the potential to cause constipation .  Additionally she takes a calcium supplement and also another supplement called Advanced Joint Relief .  --Patient will check joint release supplement to make sure it does not contain additional calcium.   --Check TSH --Trial of Amitiza 24 mcg BID. Stop Miralax once started on Amitiza --Keep drinking 60 ounces of water daily --Patient is very anxious about her symptoms.  She is concerned there is something wrong.  She gives a history of a bowel obstruction last year but I was unable to find documentation of this.  She had a CT scan in August 2021 which showed a moderate amount of stool throughout the colon but no obstruction.  I tried to reassure her that a bowel obstruction is unlikely based on her exam and symptoms --Patient will call us with a condition update in a couple of weeks  HISTORY OF PRESENT ILLNESS    Chief Complaint : Abdominal pain, bloating, problems having a bowel movement  NITA WHITMIRE is a 70 y.o. female known to Dr. Carlean Purl  with a past medical history not limited to IBS, COPD , HTN, pacemaker placement, lupus on methotrexate and Plaquenil  Kaliana has a history of IBS, she tends to lean towards constipation based on review of previous office notes.  She has been taking MIralax as needed but it either does not work well or gives her loose.  She describes decreased frequency of bowel movements.  Her stools are soft not necessarily difficult to pass but she frequently feels incompletely evacuated.  She added more fruits and vegetables to  her diet and she drinks 60 oz water daily. She frequently has generalized abdominal pain and bloating improved with bowel movements.  She takes several medications, many with potential to cause constipation such as anti-hypertensives, calcium  and Bentyl .  She takes Advanced Joint Relief but doesn't know if it also contains calcium .  Priyanka has not had any blood in her stool.  Her weight is stable.  Her last colonoscopy was in 2016 with removal of a hyperplastic polyp, otherwise normal exam. Patient is anxious about the abdominal pain and constipation and feels like there is something wrong.   DATA REVIEWED: 05/11/2020 CBC normal Bmet normal  PREVIOUS ENDOSCOPIC EVALUATIONS / PERTINENT STUDIES:   March 2016 polyp surveillance colonoscopy --Complete exam, excellent prep.  A 5 mm sessile polyp was removed from the rectum.  Moderate diverticulosis.  Polyp was hyperplastic  Past Medical History:  Diagnosis Date  . Acute respiratory failure with hypoxia ([place] 11/05/2017  . Arthritis   . Colitis, ischemic ([place])   . External hemorrhoids   . H/O: rheumatic fever   . IBS (irritable bowel syndrome)   . Personal history colonic adenoma 03/25/2008   06/2007 right sided adenoma and 2 right hyperplastic polyps    . Restless leg syndrome    questionable  . Systemic lupus erythematosus ([place])   . Tubular adenoma of colon   . 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   Current Outpatient Medications  Medication Sig Dispense Refill  . albuterol (VENTOLIN HFA) 108 (90 Base) MCG/ACT inhaler Inhale 1-2 puffs into the lungs every 6 (six) hours as needed for wheezing or shortness of breath. 8 g 2  . amLODipine (NORVASC) 2.5 MG tablet Take 1 tablet (2.5 mg total) by mouth daily. 90 tablet 3  . ANORO ELLIPTA 62.5-25 MCG/INH AEPB INHALE 1 PUFF BY MOUTH ONCE DAILY  60 each 0  . aspirin 81 MG tablet Take 81 mg by mouth daily.    Marland Kitchen BLACK COHOSH PO Take 1 capsule by mouth daily.    . Calcium Carb-Cholecalciferol (CALCIUM 600 + D PO) Take 1 tablet by mouth daily.    . calcium carbonate (OS-CAL - DOSED IN MG OF ELEMENTAL CALCIUM) 1250 (500 Ca) MG tablet Take 1 tablet by mouth daily as needed (heartburn).    . Cholecalciferol (VITAMIN D) 50 MCG (2000 UT) tablet Take 2,000 Units by mouth daily.    Marland Kitchen dicyclomine (BENTYL) 10 MG capsule Take 1 capsule (10 mg total) by mouth 4 (four) times daily -  before meals and at bedtime. 90 capsule 0  . doxazosin (CARDURA) 1 MG tablet Take 1 tablet (1 mg total) by mouth daily. 90 tablet 0  . folic acid (FOLVITE) 1 MG tablet Take 1 mg by mouth daily.    Marland Kitchen gabapentin (NEURONTIN) 300 MG capsule TAKE TWO CAPSULES BY MOUTH EVERY MORNING, TWO capsules each afternoon AND THREE CAPSULES AT bedtime 630 capsule 1  . hydroxychloroquine (PLAQUENIL) 200 MG tablet Take 200 mg by mouth 2 (two) times daily.    Marland Kitchen ibuprofen (ADVIL) 200 MG tablet Take 400 mg by mouth every 6 (six) hours as needed for headache or mild pain.    Marland Kitchen loratadine (CLARITIN) 10 MG tablet Take 10 mg by mouth daily as needed for allergies.    . methotrexate 2.5 MG tablet Take 10 mg by mouth every Thursday.    . Misc Natural Products (ADVANCED JOINT RELIEF) CAPS Take 1 capsule by mouth daily. Joint Advantage Gold 5x    . Multiple Vitamin (MULTIVITAMIN WITH MINERALS) TABS tablet Take 1 tablet by mouth daily.    . Nutritional Supplements (MENOPAUSE FORMULA) TABS Take 1 tablet by mouth daily.    . Omega-3 Fatty Acids (OMEGA 3 PO) Take 1 capsule by mouth daily.    Marland Kitchen OVER THE COUNTER MEDICATION Take 1 capsule by mouth daily. brain essentials otc supplement    . Polyethyl Glycol-Propyl Glycol (SYSTANE OP) Place 1 drop into both eyes 2 (two) times daily.    . RESTASIS 0.05 % ophthalmic emulsion Place 1 drop into both eyes 2 (two) times daily.  6  . saccharomyces boulardii  (FLORASTOR) 250 MG capsule Take 250 mg by mouth 2 (two) times daily.    Marland Kitchen telmisartan (MICARDIS) 40 MG tablet TAKE TWO TABLETS BY MOUTH ONCE DAILY 180 tablet 5  . triamcinolone cream (KENALOG) 0.1 % Apply 1 application topically 2 (two) times daily. (Patient taking differently: Apply 1 application topically 2 (two) times daily as needed (lupus flare).) 30 g 0  . triamcinolone ointment (KENALOG) 0.5 % Apply 1 application topically 2 (two) times daily. (Patient taking differently: Apply 1 application topically 2 (two) times daily as needed (lupus flare).) 30 g 0  . vitamin C (ASCORBIC ACID) 500 MG tablet Take 500 mg by mouth daily.    . vitamin E 180 MG (400 UNITS) capsule Take 400  Units by mouth daily.    Marland Kitchen zolpidem (AMBIEN) 5 MG tablet TAKE ONE TABLET BY MOUTH EVERYDAY AT BEDTIME AS NEEDED (Patient taking differently: Take 5 mg by mouth at bedtime.) 30 tablet 0   No current facility-administered medications for this visit.    Review of Systems: No chest pain. No shortness of breath. No urinary complaints.   PHYSICAL EXAM :    Wt Readings from Last 3 Encounters:  08/01/20 131 lb (59.4 kg)  07/21/20 133 lb 3.2 oz (60.4 kg)  06/29/20 130 lb 9.6 oz (59.2 kg)    BP 120/60   Pulse 64   Ht 5\' 2"  (1.575 m)   Wt 131 lb (59.4 kg)   BMI 23.96 kg/m  Constitutional:  Pleasant female in no acute distress. Psychiatric: Normal mood and affect. Behavior is normal. EENT: Pupils normal.  Conjunctivae are normal. No scleral icterus. Neck supple.  Cardiovascular: Normal rate, regular rhythm. No edema Pulmonary/chest: Effort normal and breath sounds normal. No wheezing, rales or rhonchi. Abdominal: Soft, nondistended, nontender. Bowel sounds active throughout. There are no masses palpable. No hepatomegaly. Neurological: Alert and oriented to person place and time. Skin: Skin is warm and dry. No rashes noted.  Tye Savoy, NP  08/01/2020, 3:12 PM

## 2020-08-03 ENCOUNTER — Telehealth: Payer: Self-pay | Admitting: Pharmacist

## 2020-08-03 NOTE — Progress Notes (Addendum)
Chronic Care Management Pharmacy Assistant   Name: Sheila Morales  MRN: [id number] DOB: 05-15-1950   Reason for Encounter: Medication Coordination Call    Recent office visits:  08/01/20 NP Tye Savoy, ordered Amitiza 24 mg 1 cap by mouth twice daily  Recent consult visits:  None ID  Hospital visits:  Medication Reconciliation was completed by comparing discharge summary, patient's EMR and Pharmacy list, and upon discussion with patient.  Admitted to the hospital on 05/26/20 due to pacemaker. Discharge date was 05/26/20 Discharged from [facility]?Medications Started at [facility] Discharge:?? -started None ID  Medication Changes at Hospital Discharge: -Changed None ID  Medications Discontinued at Hospital Discharge: -Stopped None ID  Medications that remain the same after Hospital Discharge:??  -All other medications will remain the same.    Medications: Outpatient Encounter Medications as of 08/03/2020  Medication Sig Note   albuterol (VENTOLIN HFA) 108 (90 Base) MCG/ACT inhaler Inhale 1-2 puffs into the lungs every 6 (six) hours as needed for wheezing or shortness of breath.    amLODipine (NORVASC) 2.5 MG tablet Take 1 tablet (2.5 mg total) by mouth daily.    ANORO ELLIPTA 62.5-25 MCG/INH AEPB INHALE 1 PUFF BY MOUTH ONCE DAILY    aspirin 81 MG tablet Take 81 mg by mouth daily.    BLACK COHOSH PO Take 1 capsule by mouth daily.    Calcium Carb-Cholecalciferol (CALCIUM 600 + D PO) Take 1 tablet by mouth daily.    calcium carbonate (OS-CAL - DOSED IN MG OF ELEMENTAL CALCIUM) 1250 (500 Ca) MG tablet Take 1 tablet by mouth daily as needed (heartburn).    Cholecalciferol (VITAMIN D) 50 MCG (2000 UT) tablet Take 2,000 Units by mouth daily.    dicyclomine (BENTYL) 10 MG capsule Take 1 capsule (10 mg total) by mouth 4 (four) times daily -  before meals and at bedtime.    doxazosin (CARDURA) 1 MG tablet Take 1 tablet (1 mg total) by mouth daily.    folic acid  (FOLVITE) 1 MG tablet Take 1 mg by mouth daily.    gabapentin (NEURONTIN) 300 MG capsule TAKE TWO CAPSULES BY MOUTH EVERY MORNING, TWO capsules each afternoon AND THREE CAPSULES AT bedtime    hydroxychloroquine (PLAQUENIL) 200 MG tablet Take 200 mg by mouth 2 (two) times daily.    ibuprofen (ADVIL) 200 MG tablet Take 400 mg by mouth every 6 (six) hours as needed for headache or mild pain.    loratadine (CLARITIN) 10 MG tablet Take 10 mg by mouth daily as needed for allergies.    lubiprostone (AMITIZA) 24 MCG capsule Take 1 capsule (24 mcg total) by mouth 2 (two) times daily with a meal.    methotrexate 2.5 MG tablet Take 10 mg by mouth every Thursday.    Misc Natural Products (ADVANCED JOINT RELIEF) CAPS Take 1 capsule by mouth daily. Joint Advantage Gold 5x    Multiple Vitamin (MULTIVITAMIN WITH MINERALS) TABS tablet Take 1 tablet by mouth daily.    Nutritional Supplements (MENOPAUSE FORMULA) TABS Take 1 tablet by mouth daily.    Omega-3 Fatty Acids (OMEGA 3 PO) Take 1 capsule by mouth daily.    OVER THE COUNTER MEDICATION Take 1 capsule by mouth daily. brain essentials otc supplement    Polyethyl Glycol-Propyl Glycol (SYSTANE OP) Place 1 drop into both eyes 2 (two) times daily.    RESTASIS 0.05 % ophthalmic emulsion Place 1 drop into both eyes 2 (two) times daily.  saccharomyces boulardii (FLORASTOR) 250 MG capsule Take 250 mg by mouth 2 (two) times daily.    telmisartan (MICARDIS) 40 MG tablet TAKE TWO TABLETS BY MOUTH ONCE DAILY    triamcinolone cream (KENALOG) 0.1 % Apply 1 application topically 2 (two) times daily. (Patient taking differently: Apply 1 application topically 2 (two) times daily as needed (lupus flare).) 05/18/2020: Pt has both ointment and cream   triamcinolone ointment (KENALOG) 0.5 % Apply 1 application topically 2 (two) times daily. (Patient taking differently: Apply 1 application topically 2 (two) times daily as needed (lupus flare).) 05/18/2020: Pt has both ointment and  cream   vitamin C (ASCORBIC ACID) 500 MG tablet Take 500 mg by mouth daily.    vitamin E 180 MG (400 UNITS) capsule Take 400 Units by mouth daily.    zolpidem (AMBIEN) 5 MG tablet TAKE ONE TABLET BY MOUTH EVERYDAY AT BEDTIME AS NEEDED (Patient taking differently: Take 5 mg by mouth at bedtime.)    No facility-administered encounter medications on file as of 08/03/2020.    Reviewed chart for medication changes ahead of medication coordination call.  No OVs, Consults, or hospital visits since last care coordination call/Pharmacist visit. (If appropriate, list visit date, provider name)  No medication changes indicated OR if recent visit, treatment plan here.  BP Readings from Last 3 Encounters:  08/01/20 120/60  07/21/20 136/70  06/29/20 112/60    Lab Results  Component Value Date   HGBA1C 5.7 02/24/2017     Patient obtains medications through Vials  90 Days   Last adherence delivery included:  Anoro Ellipt inhale 1 puff by mouth daily Zolpidem tartrate 5 mg 1 tab by mouth everyday at bedtime as needed  Patient is due for next adherence delivery on: 08/17/20. Called patient and reviewed medications and coordinated delivery.  [facility] has decided to use [facility] medication refills. Updated preferred pharmacy per patient preference.  [id number] [address], [address] [place] [address] Phone: [phone] Fax: [phone]  [place] Pharmacist Assistant [phone]  Time spent:40

## 2020-08-07 ENCOUNTER — Telehealth: Payer: Self-pay | Admitting: Pharmacist

## 2020-08-07 ENCOUNTER — Telehealth: Payer: Self-pay | Admitting: Nurse Practitioner

## 2020-08-07 NOTE — Progress Notes (Addendum)
Allison Deshotels called this morning stating that she wanted to switch back to her pharmacy [facility] [address]. She said that the reason why she wants to switch back is that [facility] does not open on weekends. I told Ms. Ransom that I would pass along this information to the clinical pharmacist Mendel Ryder.  [place] Pharmacist Assistant [phone]

## 2020-08-07 NOTE — Telephone Encounter (Signed)
Called and spoke with patient, she stated that she started on the Amitiza on Friday, and that she started having the watery stools that night and has been staying in the bathroom with watery stools since. She states that she did not take it last night or today and has had a solid, but soft stool today. Please advise how to proceed.

## 2020-08-09 NOTE — Telephone Encounter (Signed)
Called and advised that patient of her options. She has decided that she will decrease to once a day and will call the office her bowel movements are still too lose.

## 2020-08-10 NOTE — Telephone Encounter (Signed)
Inbound call from patient. Stating she decreased medication to once a day and bowel movements are like water. Stated she have gone to the bathroom 3/4 times within the last hour.

## 2020-08-11 NOTE — Telephone Encounter (Signed)
Called and Spoke with patient to find out if she wanted to decrease the dose of Amitiza or if she wanted to go back to Miralax. She has decided that she would go back to Miralax and prunes. She felt that the prunes helped her better along with the Miralax.

## 2020-08-18 NOTE — Telephone Encounter (Signed)
Returned patient's phone call, she stated that she has continued with diarrhea. She has been waking up at 5:00 am with diarrhea and will have a second bowel movement that is a bit more solid, but is still lose. She is not currently taking any medications. Please advise.

## 2020-08-18 NOTE — Telephone Encounter (Signed)
Inbound call from patient. She states she is now having diarrhea that is water base. She would like to discuss her options. Would like a call back [phone].

## 2020-08-21 ENCOUNTER — Telehealth: Payer: Self-pay | Admitting: Internal Medicine

## 2020-08-21 MED ORDER — DOXAZOSIN MESYLATE 1 MG PO TABS: 1.0000 mg | ORAL_TABLET | Freq: Every day | ORAL | 0 refills | Status: DC

## 2020-08-21 NOTE — Telephone Encounter (Signed)
Medication has been sent to the patient's pharmacy.  

## 2020-08-21 NOTE — Telephone Encounter (Signed)
doxazosin (CARDURA) 1 MG tablet [id number] [address], [place] [address] Phone:  [phone]  Fax:  [phone]     Patient calling, requesting it be sent to new pharmacy listed above. Also patient is out of medication.  Last seen-04.01.22 Next apt- n/a

## 2020-08-21 NOTE — Telephone Encounter (Signed)
The effects of Amitiza do not last this long. If not taking ANY medications then not sure why she is having diarrhea now. Any recent antibiotics to worry about C-diff.

## 2020-08-24 ENCOUNTER — Ambulatory Visit: Payer: PPO | Admitting: Emergency Medicine

## 2020-08-24 ENCOUNTER — Other Ambulatory Visit: Payer: Self-pay

## 2020-08-24 ENCOUNTER — Encounter: Payer: Self-pay | Admitting: Emergency Medicine

## 2020-08-24 DIAGNOSIS — Z720 Tobacco use: Secondary | ICD-10-CM | POA: Diagnosis not present

## 2020-08-24 DIAGNOSIS — J449 Chronic obstructive pulmonary disease, unspecified: Secondary | ICD-10-CM | POA: Diagnosis not present

## 2020-08-24 DIAGNOSIS — Z79899 Other long term (current) drug therapy: Secondary | ICD-10-CM | POA: Diagnosis not present

## 2020-08-24 DIAGNOSIS — Z79631 Long term (current) use of antimetabolite agent: Secondary | ICD-10-CM

## 2020-08-24 MED ORDER — ANORO ELLIPTA 62.5-25 MCG/INH IN AEPB: 1.0000 | INHALATION_SPRAY | Freq: Every day | RESPIRATORY_TRACT | 3 refills | Status: DC

## 2020-08-24 NOTE — Assessment & Plan Note (Signed)
Your chest x-ray from February was stable.  We do not need to repeat one right now.  We will recheck in 1 year at your next office visit

## 2020-08-24 NOTE — Patient Instructions (Signed)
Please continue Anoro 1 inhalation once daily. Keep your albuterol available to use 2 puffs when needed for shortness of breath, chest tightness, wheezing. Work on trying to decrease your smoking.  Ultimate goal will be to stop altogether. Your chest x-ray from February was stable.  We do not need to repeat one right now.  We will recheck in 1 year at your next office visit Follow Dr. Lamonte Sakai in 1 year or sooner if you have any problems.

## 2020-08-24 NOTE — Assessment & Plan Note (Signed)
Please continue Anoro 1 inhalation once daily. Keep your albuterol available to use 2 puffs when needed for shortness of breath, chest tightness, wheezing. Follow Dr. Lamonte Sakai in 1 year or sooner if you have any problems.

## 2020-08-24 NOTE — Progress Notes (Signed)
Subjective:    Patient ID: Sheila Morales, female    DOB: 05-07-1950, 70 y.o.   MRN: [id number]  COPD She complains of shortness of breath. There is no cough or wheezing. Pertinent negatives include no appetite change, ear pain, fever, headaches, postnasal drip, rhinorrhea, sneezing, sore throat or trouble swallowing. Her past medical history is significant for COPD.  Shortness of Breath Pertinent negatives include no ear pain, fever, headaches, leg swelling, rash, rhinorrhea, sore throat, vomiting or wheezing. Her past medical history is significant for COPD.     ROV 07/29/19 --follow-up visit for 70 year old woman, active smoker (0.5 packs daily) with associated severe COPD.  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  We have been managing her on Anoro.  She uses albuterol approximately 2x a day. She is working on weaning off her cigarettes, currently on 9 cig a day, decreasing every 2 weeks. No flares. She does have some exertional SOB with long walks and hills. She started MTX in the last year.  She is not ready to get the COVID vaccine.   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  She has pulmonary infiltrates with groundglass on prior CT chest, question related to RB-ILD versus her autoimmune disease. Since last time she has had a pacer placed, and is on a more aggressive BP regimen. She is on Anoro and tolerates well. Some dry mouth. Has albuterol and uses about 2x a day, usually for SOB and exertion. No flares since last time. She is smoking 10 or less a day.   She has not had COVID vaccines, has not had COVID. Discussed rationale to get vaccinated w her today  Chest x-ray from 05/26/2020 reviewed by me, shows stable chronic lung changes and  interstitial prominence CT scan of the abdomen and pelvis 11/23/2019 shows some bibasilar subpleural reticulation unchanged from prior   Review of Systems  Constitutional: Negative for appetite change, fever and unexpected weight change.  HENT: Negative for congestion, dental problem, ear pain, nosebleeds, postnasal drip, rhinorrhea, sinus pressure, sneezing, sore throat and trouble swallowing.   Eyes: Negative for redness and itching.  Respiratory: Positive for shortness of breath. Negative for cough, chest tightness and wheezing.   Cardiovascular: Negative for palpitations and leg swelling.  Gastrointestinal: Negative for nausea and vomiting.  Genitourinary: Negative for dysuria.  Musculoskeletal: Negative for joint swelling.       Raynaud's   Skin: Negative for rash.  Neurological: Negative for headaches.  Hematological: Does not bruise/bleed easily.  Psychiatric/Behavioral: Negative for dysphoric mood. The patient is not nervous/anxious.     Past Medical History:  Diagnosis Date  . Acute respiratory failure with hypoxia ([place]) 11/05/2017  . Arthritis   . Colitis, ischemic ([place])   . External hemorrhoids   . H/O: rheumatic fever   . IBS (irritable bowel syndrome)   . Personal history colonic adenoma 03/25/2008   06/2007 right sided adenoma and 2 right hyperplastic polyps    . Restless leg syndrome    questionable  . Systemic lupus erythematosus ([place])   . Tubular adenoma of colon   . 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 Stroke Mother        Deceased, 67s  .  Atrial fibrillation Father   . Colon cancer Father   . Other Sister        Pick's disease, deceased  . Healthy Daughter      Social History   Socioeconomic History  . Marital status: Married    Spouse name: Not on file  . Number of children: 2  . Years of education: 57  . Highest education level: Not on file  Occupational History  . Occupation: Education administrator  business  . Occupation: caregiver  Tobacco Use  . Smoking status: Former Smoker    Packs/day: 0.25    Years: 30.00    Pack years: 7.50    Types: Cigarettes    Quit date: 01/21/2020    Years since quitting: 0.5  . Smokeless tobacco: Never Used  . Tobacco comment: smokes "every now and then" 08/24/20 [facility]   Vaping Use  . Vaping Use: Never used  Substance and Sexual Activity  . Alcohol use: Yes    Alcohol/week: 5.0 standard drinks    Types: 5 Standard drinks or equivalent per week    Comment: 2 glasses of wine nightly, 4 beers nightly on weekend  . Drug use: No  . Sexual activity: Yes  Other Topics Concern  . Not on file  Social History Narrative   HSG. Married '71. 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       One level home with spouse   Right handed   Caffeine - coffee 2-3 cups am   Exercise - treadmill    Education - 12th grade   Social Determinants of Health   Financial Resource Strain: Low Risk   . Difficulty of Paying Living Expenses: Not hard at all  Food Insecurity: No Food Insecurity  . Worried About Charity fundraiser in the Last Year: Never true  . Ran Out of Food in the Last Year: Never true  Transportation Needs: No Transportation Needs  . Lack of Transportation (Medical): No  . Lack of Transportation (Non-Medical): No  Physical Activity: Sufficiently Active  . Days of Exercise per Week: 5 days  . Minutes of Exercise per Session: 30 min  Stress: No Stress Concern Present  . Feeling of Stress : Not at all  Social Connections: Moderately Integrated  . Frequency of Communication with Friends and Family: More than three times a week  . Frequency of Social Gatherings with Friends and Family: More than three times a week  . Attends Religious Services: More than 4 times per year  . Active Member of Clubs or Organizations: No  . Attends Archivist Meetings: Never  .  Marital Status: Married  Human resources officer Violence: Not on file  formerly worked Architect, Museum/gallery conservator, currently does home care, house clearing    No Known Allergies   Outpatient Medications Prior to Visit  Medication Sig Dispense Refill  . albuterol (VENTOLIN HFA) 108 (90 Base) MCG/ACT inhaler Inhale 1-2 puffs into the lungs every 6 (six) hours as needed for wheezing or shortness of breath. 8 g 2  . amLODipine (NORVASC) 2.5 MG tablet Take 1 tablet (2.5 mg total) by mouth daily. 90 tablet 3  . ANORO ELLIPTA 62.5-25 MCG/INH AEPB INHALE 1 PUFF BY MOUTH ONCE DAILY 60 each 0  . aspirin 81 MG tablet Take 81 mg by mouth daily.    Marland Kitchen BLACK COHOSH PO Take 1 capsule by mouth daily.    . Calcium Carb-Cholecalciferol (CALCIUM  600 + D PO) Take 1 tablet by mouth daily.    . calcium carbonate (OS-CAL - DOSED IN MG OF ELEMENTAL CALCIUM) 1250 (500 Ca) MG tablet Take 1 tablet by mouth daily as needed (heartburn).    . Cholecalciferol (VITAMIN D) 50 MCG (2000 UT) tablet Take 2,000 Units by mouth daily.    Marland Kitchen dicyclomine (BENTYL) 10 MG capsule Take 1 capsule (10 mg total) by mouth 4 (four) times daily -  before meals and at bedtime. 90 capsule 0  . doxazosin (CARDURA) 1 MG tablet Take 1 tablet (1 mg total) by mouth daily. 90 tablet 0  . folic acid (FOLVITE) 1 MG tablet Take 1 mg by mouth daily.    Marland Kitchen gabapentin (NEURONTIN) 300 MG capsule TAKE TWO CAPSULES BY MOUTH EVERY MORNING, TWO capsules each afternoon AND THREE CAPSULES AT bedtime 630 capsule 1  . hydroxychloroquine (PLAQUENIL) 200 MG tablet Take 200 mg by mouth 2 (two) times daily.    Marland Kitchen ibuprofen (ADVIL) 200 MG tablet Take 400 mg by mouth every 6 (six) hours as needed for headache or mild pain.    Marland Kitchen loratadine (CLARITIN) 10 MG tablet Take 10 mg by mouth daily as needed for allergies.    Marland Kitchen lubiprostone (AMITIZA) 24 MCG capsule Take 1 capsule (24 mcg total) by mouth 2 (two) times daily with a meal. 60 capsule 0  . methotrexate 2.5 MG tablet Take  10 mg by mouth every Thursday.    . Misc Natural Products (ADVANCED JOINT RELIEF) CAPS Take 1 capsule by mouth daily. Joint Advantage Gold 5x    . Multiple Vitamin (MULTIVITAMIN WITH MINERALS) TABS tablet Take 1 tablet by mouth daily.    . Nutritional Supplements (MENOPAUSE FORMULA) TABS Take 1 tablet by mouth daily.    . Omega-3 Fatty Acids (OMEGA 3 PO) Take 1 capsule by mouth daily.    Marland Kitchen OVER THE COUNTER MEDICATION Take 1 capsule by mouth daily. brain essentials otc supplement    . Polyethyl Glycol-Propyl Glycol (SYSTANE OP) Place 1 drop into both eyes 2 (two) times daily.    . RESTASIS 0.05 % ophthalmic emulsion Place 1 drop into both eyes 2 (two) times daily.  6  . saccharomyces boulardii (FLORASTOR) 250 MG capsule Take 250 mg by mouth 2 (two) times daily.    Marland Kitchen telmisartan (MICARDIS) 40 MG tablet TAKE TWO TABLETS BY MOUTH ONCE DAILY 180 tablet 5  . triamcinolone cream (KENALOG) 0.1 % Apply 1 application topically 2 (two) times daily. (Patient taking differently: Apply 1 application topically 2 (two) times daily as needed (lupus flare).) 30 g 0  . triamcinolone ointment (KENALOG) 0.5 % Apply 1 application topically 2 (two) times daily. (Patient taking differently: Apply 1 application topically 2 (two) times daily as needed (lupus flare).) 30 g 0  . vitamin C (ASCORBIC ACID) 500 MG tablet Take 500 mg by mouth daily.    . vitamin E 180 MG (400 UNITS) capsule Take 400 Units by mouth daily.    Marland Kitchen zolpidem (AMBIEN) 5 MG tablet TAKE ONE TABLET BY MOUTH EVERYDAY AT BEDTIME AS NEEDED (Patient taking differently: Take 5 mg by mouth at bedtime.) 30 tablet 0   No facility-administered medications prior to visit.        Objective:   Physical Exam Vitals:   08/24/20 1131  BP: 140/76  Pulse: 70  Temp: 97.8 F (36.6 C)  TempSrc: Temporal  SpO2: 96%  Weight: 133 lb 12.8 oz (60.7 kg)  Height: 5\' 3"  (1.6 m)  Gen: Pleasant, well-nourished, in no distress,  normal affect  ENT: No lesions,  mouth  clear,  oropharynx clear, no postnasal drip  Neck: No JVD, no Stridor  Lungs: No use of accessory muscles,  clear without rales or rhonchi  Cardiovascular: RRR, heart sounds normal, no murmur or gallops, no peripheral edema  Musculoskeletal: No deformities, no cyanosis or clubbing  Neuro: alert, non focal  Skin: no rash     Assessment & Plan:  COPD (chronic obstructive pulmonary disease) (HCC) Please continue Anoro 1 inhalation once daily. Keep your albuterol available to use 2 puffs when needed for shortness of breath, chest tightness, wheezing. Follow Dr. Lamonte Sakai in 1 year or sooner if you have any problems.  Methotrexate, long term, current use Your chest x-ray from February was stable.  We do not need to repeat one right now.  We will recheck in 1 year at your next office visit  Tobacco use Work on trying to decrease your smoking.  Ultimate goal will be to stop altogether.  Baltazar Apo, MD, PhD 08/24/2020, 12:10 PM [facility] [phone] or if no answer [phone]

## 2020-08-24 NOTE — Addendum Note (Signed)
Addended by: Gavin Potters R on: 08/24/2020 12:28 PM   Modules accepted: Orders

## 2020-08-24 NOTE — Assessment & Plan Note (Signed)
Work on trying to decrease your smoking.  Ultimate goal will be to stop altogether.

## 2020-08-25 ENCOUNTER — Ambulatory Visit (INDEPENDENT_AMBULATORY_CARE_PROVIDER_SITE_OTHER): Payer: PPO

## 2020-08-25 DIAGNOSIS — I495 Sick sinus syndrome: Secondary | ICD-10-CM

## 2020-08-25 LAB — CUP PACEART REMOTE DEVICE CHECK
Date Time Interrogation Session: 20220506064218
Implantable Lead Implant Date: 20220204
Implantable Lead Implant Date: 20220204
Implantable Lead Location: 753859
Implantable Lead Location: 753860
Implantable Lead Model: 377169
Implantable Lead Model: 377171
Implantable Lead Serial Number: 8000143637
Implantable Lead Serial Number: 8000151447
Implantable Pulse Generator Implant Date: 20220204
Pulse Gen Model: 407145
Pulse Gen Serial Number: 70020732

## 2020-08-28 DIAGNOSIS — D2372 Other benign neoplasm of skin of left lower limb, including hip: Secondary | ICD-10-CM | POA: Diagnosis not present

## 2020-08-28 DIAGNOSIS — L930 Discoid lupus erythematosus: Secondary | ICD-10-CM | POA: Diagnosis not present

## 2020-08-28 DIAGNOSIS — L821 Other seborrheic keratosis: Secondary | ICD-10-CM | POA: Diagnosis not present

## 2020-08-28 DIAGNOSIS — D225 Melanocytic nevi of trunk: Secondary | ICD-10-CM | POA: Diagnosis not present

## 2020-08-28 NOTE — Telephone Encounter (Signed)
Called and spoke with patient, she has not had any recent antibiotic use. Her diarrhea has been coming and going, so she has varied between diarrhea and constipation and has started taking a stool softener, and is doing much better.

## 2020-09-07 ENCOUNTER — Ambulatory Visit: Payer: PPO | Admitting: Internal Medicine

## 2020-09-07 ENCOUNTER — Other Ambulatory Visit: Payer: Self-pay

## 2020-09-07 ENCOUNTER — Encounter: Payer: Self-pay | Admitting: Internal Medicine

## 2020-09-07 VITALS — BP 128/72 | HR 73 | Ht 63.0 in | Wt 138.0 lb

## 2020-09-07 DIAGNOSIS — I495 Sick sinus syndrome: Secondary | ICD-10-CM

## 2020-09-07 DIAGNOSIS — I10 Essential (primary) hypertension: Secondary | ICD-10-CM | POA: Diagnosis not present

## 2020-09-07 DIAGNOSIS — Z950 Presence of cardiac pacemaker: Secondary | ICD-10-CM

## 2020-09-07 NOTE — Progress Notes (Signed)
Remote pacemaker transmission.   

## 2020-09-07 NOTE — Progress Notes (Signed)
HPI [name] returns today for followup. She is a pleasant 70 yo woman with a h/o sinus node dysfunction, s/p PPM insertion. She has returned to work. She denies chest pain or sob. She has some swelling at night but resolved by the morning. 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  No Known Allergies   Current Outpatient Medications  Medication Sig Dispense Refill  . albuterol (VENTOLIN HFA) 108 (90 Base) MCG/ACT inhaler Inhale 1-2 puffs into the lungs every 6 (six) hours as needed for wheezing or shortness of breath. 8 g 2  . amLODipine (NORVASC) 2.5 MG tablet Take 1 tablet (2.5 mg total) by mouth daily. 90 tablet 3  . aspirin 81 MG tablet Take 81 mg by mouth daily.    Marland Kitchen BLACK COHOSH PO Take 1 capsule by mouth daily.    . Calcium Carb-Cholecalciferol (CALCIUM 600 + D PO) Take 1 tablet by mouth daily.    . calcium carbonate (OS-CAL - DOSED IN MG OF ELEMENTAL CALCIUM) 1250 (500 Ca) MG tablet Take 1 tablet by mouth daily as needed (heartburn).    . Cholecalciferol (VITAMIN D) 50 MCG (2000 UT) tablet Take 2,000 Units by mouth daily.    Marland Kitchen dicyclomine (BENTYL) 10 MG capsule Take 1 capsule (10 mg total) by mouth 4 (four) times daily -  before meals and at bedtime. 90 capsule 0  . doxazosin (CARDURA) 1 MG tablet Take 1 tablet (1 mg total) by mouth daily. 90 tablet 0  . folic acid (FOLVITE) 1 MG tablet Take 1 mg by mouth daily.    Marland Kitchen gabapentin (NEURONTIN) 300 MG capsule TAKE TWO CAPSULES BY MOUTH EVERY MORNING, TWO capsules each afternoon AND THREE CAPSULES AT bedtime 630 capsule 1  . hydroxychloroquine (PLAQUENIL) 200 MG tablet Take 200 mg by mouth 2 (two) times daily.    Marland Kitchen ibuprofen (ADVIL) 200 MG tablet Take 400 mg by mouth every 6 (six) hours as needed for headache or mild pain.    . methotrexate 2.5 MG tablet Take 10 mg by mouth every Thursday.    . Misc Natural Products (ADVANCED JOINT RELIEF) CAPS Take 1 capsule by mouth daily.  Joint Advantage Gold 5x    . Multiple Vitamin (MULTIVITAMIN WITH MINERALS) TABS tablet Take 1 tablet by mouth daily.    . Omega-3 Fatty Acids (OMEGA 3 PO) Take 1 capsule by mouth daily.    Marland Kitchen OVER THE COUNTER MEDICATION Take 1 capsule by mouth daily. brain essentials otc supplement    . Polyethyl Glycol-Propyl Glycol (SYSTANE OP) Place 1 drop into both eyes 2 (two) times daily.    . RESTASIS 0.05 % ophthalmic emulsion Place 1 drop into both eyes 2 (two) times daily.  6  . saccharomyces boulardii (FLORASTOR) 250 MG capsule Take 250 mg by mouth 2 (two) times daily.    Marland Kitchen telmisartan (MICARDIS) 40 MG tablet TAKE TWO TABLETS BY MOUTH ONCE DAILY 180 tablet 5  . umeclidinium-vilanterol (ANORO ELLIPTA) 62.5-25 MCG/INH AEPB Inhale 1 puff into the lungs daily. Inhale 1 puff daily 60 each 3  . vitamin C (ASCORBIC ACID) 500 MG tablet Take 500 mg by mouth daily.    . vitamin E 180 MG (400 UNITS) capsule Take 400 Units by mouth daily.    Marland Kitchen zolpidem (AMBIEN) 5 MG tablet TAKE ONE TABLET BY MOUTH EVERYDAY AT BEDTIME AS NEEDED 30 tablet 0   No current facility-administered medications for this visit.  Past Medical History:  Diagnosis Date  . Acute respiratory failure with hypoxia ([place]) 11/05/2017  . Arthritis   . Colitis, ischemic ([place])   . External hemorrhoids   . H/O: rheumatic fever   . IBS (irritable bowel syndrome)   . Personal history colonic adenoma 03/25/2008   06/2007 right sided adenoma and 2 right hyperplastic polyps    . Restless leg syndrome    questionable  . Systemic lupus erythematosus ([place])   . Tubular adenoma of colon   . 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   Past Surgical History:  Procedure Laterality Date  . CHOLECYSTECTOMY    . CHOLECYSTECTOMY, LAPAROSCOPIC    . COLONOSCOPY    . ESOPHAGOGASTRODUODENOSCOPY    . lesion, vulva excision    . 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   Procedure: PACEMAKER  IMPLANT;  Surgeon: [name], MD;  Location: [facility];  Service: Cardiovascular;  Laterality: N/A;  . TONSILLECTOMY    . VIDEO BRONCHOSCOPY Bilateral 11/06/2017   Procedure: VIDEO BRONCHOSCOPY WITHOUT FLUORO;  Surgeon: Marshell Garfinkel, MD;  Location: [facility];  Service: Cardiopulmonary;  Laterality: Bilateral;     Family History  Problem Relation Age of Onset  . Stroke Mother        Deceased, 12s  . Atrial fibrillation Father   . Colon cancer Father   . Other Sister        Pick's disease, deceased  . Healthy Daughter      Social History   Socioeconomic History  . Marital status: Married    Spouse name: Not on file  . Number of children: 2  . Years of education: 78  . Highest education level: Not on file  Occupational History  . Occupation: Education administrator business  . Occupation: caregiver  Tobacco Use  . Smoking status: Former Smoker    Packs/day: 0.25    Years: 30.00    Pack years: 7.50    Types: Cigarettes    Quit date: 01/21/2020    Years since quitting: 0.6  . Smokeless tobacco: Never Used  . Tobacco comment: smokes "every now and then" 08/24/20 [facility]   Vaping Use  . Vaping Use: Never used  Substance and Sexual Activity  . Alcohol use: Yes    Alcohol/week: 5.0 standard drinks    Types: 5 Standard drinks or equivalent per week    Comment: 2 glasses of wine nightly, 4 beers nightly on weekend  . Drug use: No  . Sexual activity: Yes  Other Topics Concern  . Not on file  Social History Narrative   HSG. Married '71. 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       One level home with spouse   Right handed   Caffeine - coffee 2-3 cups am   Exercise - treadmill    Education - 12th grade   Social Determinants of Health   Financial Resource Strain: Low Risk   . Difficulty of Paying Living Expenses: Not hard at all  Food Insecurity: No Food Insecurity  . Worried About  Charity fundraiser in the Last Year: Never true  . Ran Out of Food in the Last Year: Never true  Transportation Needs: No Transportation Needs  . Lack of Transportation (Medical): No  . Lack of Transportation (Non-Medical): No  Physical Activity: Sufficiently  Active  . Days of Exercise per Week: 5 days  . Minutes of Exercise per Session: 30 min  Stress: No Stress Concern Present  . Feeling of Stress : Not at all  Social Connections: Moderately Integrated  . Frequency of Communication with Friends and Family: More than three times a week  . Frequency of Social Gatherings with Friends and Family: More than three times a week  . Attends Religious Services: More than 4 times per year  . Active Member of Clubs or Organizations: No  . Attends Archivist Meetings: Never  . Marital Status: Married  Human resources officer Violence: Not on file     BP 128/72   Pulse 73   Ht 5\' 3"  (1.6 m)   Wt 138 lb (62.6 kg)   SpO2 98%   BMI 24.45 kg/m   Physical Exam:  Well appearing NAD HEENT: Unremarkable Neck:  No JVD, no thyromegally Lymphatics:  No adenopathy Back:  No CVA tenderness Lungs:  Clear with no wheezes HEART:  Regular rate rhythm, no murmurs, no rubs, no clicks Abd:  soft, positive bowel sounds, no organomegally, no rebound, no guarding Ext:  2 plus pulses, no edema, no cyanosis, no clubbing Skin:  No rashes no nodules Neuro:  CN II through XII intact, motor grossly intact  EKG - nsr with atrial pacing  DEVICE  Normal device function.  See PaceArt for details.   Assess/Plan: 1. Sinus node dysfunction - she is asymptomatic, s/p PPM insertion. 2. 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 We will recheck in several months. 3. Tobacco abuse - in remission.  4. HTN - her bp is well controlled. No change in her meds.  Carleene Overlie Lanell Carpenter,MD

## 2020-09-07 NOTE — Patient Instructions (Signed)
Medication Instructions:  Your physician recommends that you continue on your current medications as directed. Please refer to the Current Medication list given to you today.  Labwork: None ordered.  Testing/Procedures: None ordered.  Follow-Up: Your physician wants you to follow-up in: one year with [name], MD or one of the following Advanced Practice Providers on your designated Care Team:    Chanetta Marshall, NP  Tommye Standard, PA-C  Legrand Como "Jonni Sanger" [place], [place]  Remote monitoring is used to monitor your Pacemaker from home. This monitoring reduces the number of office visits required to check your device to one time per year. It allows [place] to keep an eye on the functioning of your device to ensure it is working properly. You are scheduled for a device check from home on 11/24/2020. You may send your transmission at any time that day. If you have a wireless device, the transmission will be sent automatically. After your physician reviews your transmission, you will receive a postcard with your next transmission date.  Any Other Special Instructions Will Be Listed Below (If Applicable).  If you need a refill on your cardiac medications before your next appointment, please call your pharmacy.

## 2020-09-16 ENCOUNTER — Other Ambulatory Visit: Payer: Self-pay | Admitting: Internal Medicine

## 2020-09-19 NOTE — Telephone Encounter (Signed)
I am very confused, [facility] narcotic database states her last refill was 07/27/20 for 90 pills. We have never written her an rx for 90 pills. How did they fill this for her? Also she should not be out until July if just given 90 pills.

## 2020-09-21 DIAGNOSIS — Z79899 Other long term (current) drug therapy: Secondary | ICD-10-CM | POA: Diagnosis not present

## 2020-09-21 DIAGNOSIS — R197 Diarrhea, unspecified: Secondary | ICD-10-CM | POA: Diagnosis not present

## 2020-09-21 DIAGNOSIS — L932 Other local lupus erythematosus: Secondary | ICD-10-CM | POA: Diagnosis not present

## 2020-09-21 DIAGNOSIS — Z6823 Body mass index (BMI) 23.0-23.9, adult: Secondary | ICD-10-CM | POA: Diagnosis not present

## 2020-09-21 DIAGNOSIS — I7300 Raynaud's syndrome without gangrene: Secondary | ICD-10-CM | POA: Diagnosis not present

## 2020-09-21 DIAGNOSIS — M2550 Pain in unspecified joint: Secondary | ICD-10-CM | POA: Diagnosis not present

## 2020-09-21 DIAGNOSIS — M329 Systemic lupus erythematosus, unspecified: Secondary | ICD-10-CM | POA: Diagnosis not present

## 2020-09-25 ENCOUNTER — Telehealth: Payer: Self-pay | Admitting: Nurse Practitioner

## 2020-09-25 NOTE — Telephone Encounter (Signed)
Called patient to discuss. Leaky gut not a medical diagnosis as of this point in time. We do not test for it.

## 2020-09-25 NOTE — Telephone Encounter (Signed)
Sheila Morales I am unfamiliar with leaky gut dx or testing. Is that connected to histamine intolerance? What should I tell this patient?  Please advise.

## 2020-09-25 NOTE — Telephone Encounter (Signed)
Pt wants to know if there is a test for leaky gut and histamine intolerance. Pls call her.

## 2020-09-26 ENCOUNTER — Telehealth: Payer: Self-pay | Admitting: Nurse Practitioner

## 2020-09-26 NOTE — Telephone Encounter (Signed)
Spoke with the patient. She followed the instructions. Unfortunately the medication caused an extreme response. She has an ache in her abdomen. She has been able eat today without any further incident.  She is asking for other options. Thanks

## 2020-09-26 NOTE — Telephone Encounter (Signed)
Inbound call from patient. Stated she started Ludiprostone this morning. 1 hour after she ate, states she had diarrhea and stomach cramps. Best contact contact [phone]

## 2020-09-27 NOTE — Telephone Encounter (Signed)
Patient advised. She will try samples of both medication and decide which works best for her.  Trulance 3mg  take one tablet daily with or without food. She has 3 sample tablets. Linzess 90mcg take one tablet 30 minutes before the first meal of the day. She has 4 sample tablets. She understands not to take both in the same day.  She is provided with written instructions.

## 2020-09-27 NOTE — Telephone Encounter (Signed)
She can try linzess 72 mcg daily but let her know that can cause diarrhea in some patients. If she prefers she can try Trulance daily. I do not know if her insurance company will pay for either one of these.  Thanks

## 2020-10-02 ENCOUNTER — Other Ambulatory Visit: Payer: Self-pay

## 2020-10-02 MED ORDER — LINACLOTIDE 72 MCG PO CAPS: 72.0000 ug | ORAL_CAPSULE | Freq: Every day | ORAL | 2 refills | Status: DC

## 2020-10-02 NOTE — Telephone Encounter (Signed)
Linzess was effective then began causing diarrhea as well.

## 2020-10-02 NOTE — Telephone Encounter (Signed)
Patient called requesting to speak with you regarding the Linzess samples\trial.

## 2020-10-02 NOTE — Telephone Encounter (Signed)
Prescription sent to local pharmacy

## 2020-10-02 NOTE — Telephone Encounter (Signed)
Inbound call from pt stating she would like to use Linzess if it can be filled at the pharmacy.

## 2020-10-11 ENCOUNTER — Other Ambulatory Visit: Payer: Self-pay | Admitting: Emergency Medicine

## 2020-10-11 DIAGNOSIS — J209 Acute bronchitis, unspecified: Secondary | ICD-10-CM

## 2020-10-12 ENCOUNTER — Telehealth: Payer: Self-pay | Admitting: Internal Medicine

## 2020-10-12 NOTE — Telephone Encounter (Signed)
1.Medication Requested: gabapentin (NEURONTIN) 300 MG capsule   2. Pharmacy (Name, [address], [place]): [id number] - [place], [address]  3. On Med List: yes   4. Last Visit with PCP: 06-29-20  5. Next visit date with PCP: n/a    Agent: Please be advised that RX refills may take up to 3 business days. We ask that you follow-up with your pharmacy.

## 2020-10-13 ENCOUNTER — Telehealth: Payer: Self-pay | Admitting: Emergency Medicine

## 2020-10-13 DIAGNOSIS — J209 Acute bronchitis, unspecified: Secondary | ICD-10-CM

## 2020-10-13 MED ORDER — GABAPENTIN 300 MG PO CAPS: ORAL_CAPSULE | ORAL | 1 refills | Status: DC

## 2020-10-13 MED ORDER — ALBUTEROL SULFATE HFA 108 (90 BASE) MCG/ACT IN AERS: 1.0000 | INHALATION_SPRAY | Freq: Four times a day (QID) | RESPIRATORY_TRACT | 5 refills | Status: DC | PRN

## 2020-10-13 NOTE — Telephone Encounter (Signed)
Pt is requesting a refill for albuterol (VENTOLIN HFA) 108 (90 Base) MCG/ACT inhaler.  Pharmacy; [id number] - [place], [address]; [phone]

## 2020-10-13 NOTE — Telephone Encounter (Signed)
Medication has been sent to the patient's pharmacy.  

## 2020-10-13 NOTE — Telephone Encounter (Signed)
Called and spoke with Patient.  Patient requested Albuterol refill to be sent to [facility].   Requested prescription sent.  Nothing further at this time.

## 2020-10-16 ENCOUNTER — Telehealth: Payer: Self-pay | Admitting: Nurse Practitioner

## 2020-10-16 NOTE — Telephone Encounter (Signed)
Inbound call from patient stating she is still experiencing diarrhea.  Please advise. 

## 2020-10-16 NOTE — Telephone Encounter (Addendum)
Patient reports normal bowel movement an hour after taking Linzess and then at least 1 or 2 more bowel movements that are diarrhea. "My stomach stays messed up for about half of the morning. I cannot take it on Sunday mornings because I will have to get up in the middle of the service." She has been off of all her supplements for several weeks. She does not notice any improvement in her bowel issues off of these and will restart them tomorrow. She is not taking any stool softeners. She asks about QOD dosing with Linzess 72 mcg. She adds that she has had spells of lightheadedness and some shakiness that she does not know if it is related to the [address].

## 2020-10-16 NOTE — Telephone Encounter (Signed)
Patient is advised of this plan.

## 2020-10-30 ENCOUNTER — Telehealth: Payer: Self-pay | Admitting: Internal Medicine

## 2020-10-30 ENCOUNTER — Telehealth: Payer: Self-pay | Admitting: Nurse Practitioner

## 2020-10-30 MED ORDER — ZOLPIDEM TARTRATE 5 MG PO TABS: 5.0000 mg | ORAL_TABLET | Freq: Every evening | ORAL | 5 refills | Status: DC | PRN

## 2020-10-30 NOTE — Addendum Note (Signed)
Addended by: Pricilla Holm A on: 10/30/2020 08:52 AM   Modules accepted: Orders

## 2020-10-30 NOTE — Telephone Encounter (Signed)
Pt called to inform that she stopped taking Linzess because of the side effects that she was having. She would like to speak with you to see what else she can take.

## 2020-10-30 NOTE — Telephone Encounter (Signed)
LR: 07-27-2020 Qty: 74 Last office visit: 06-29-2020 Upcoming appointment: No pending appt

## 2020-10-30 NOTE — Telephone Encounter (Signed)
   Patient requesting refill for   zolpidem (AMBIEN) 5 MG tablet  Pharmacy [id number] - [place], [place]

## 2020-10-31 NOTE — Telephone Encounter (Signed)
Inbound call from patient. Following up on call.

## 2020-10-31 NOTE — Telephone Encounter (Signed)
Spoke with the patient. She continues to have good days and bad days. She taking Linzess QOD and stool softeners PRN. She has stopped all supplements and is adding 1 back at a time every 2 weeks. She is keeping a food diary. She has days with cramping in the lower abdomen. Sometimes this happens on the days she takes Linzess.  She is drinking half a soda on some days because "it settles" her stomach. She will continue her current regimen. Encouraged to schedule a follow up appointment in August.

## 2020-10-31 NOTE — Telephone Encounter (Signed)
thanks

## 2020-11-13 ENCOUNTER — Telehealth: Payer: Self-pay | Admitting: Internal Medicine

## 2020-11-13 NOTE — Telephone Encounter (Signed)
STAT if patient feels like he/she is going to faint   Are you dizzy now?  No   Do you feel faint or have you passed out?  No  Do you have any other symptoms?  No   Have you checked your HR and BP (record if available)?   07/22: 115/61 64  07/23: 143/81 77  78/56 96  07/24: 101/55 80  144/78 73  122/71 63  07/25: 148/85 79

## 2020-11-13 NOTE — Telephone Encounter (Signed)
Attempted to contact patient about remote monitor. No answer, LMTCB.  Patient has a Biotronik device while the remote monitor transmits each night. She has not had any alerts triggered since before her last follow up in mid may. This device does not send a manual download since it will sent each night if an alert is triggered. Attempted to call patient to notify.

## 2020-11-13 NOTE — Telephone Encounter (Signed)
Returned the call to the patient. She stated that she has been having some dizziness lately mainly when she is ambulating. She stated that she never took her blood pressure on a regular basis but since the dizziness had started she started taking it three times daily.  07/22: 115/61  64   07/23: 143/81  77             78/56   96 (does not remember if this was AM or PM)   07/24: 101/55  80             144/78 73             122/71 63   07/25: 148/85  79   She is currently taking: Telmisartan 40 mg twice daily Doxazosin 1 mg daily Amlodipine 2.5 mg once daily (She said she only takes half of the tablet)  This morning her blood pressure was 148/85 and heart rate was 79, before medications. While on the phone her blood pressure was 157/79 and heart rate 94 (two hours after her morning medications)  She has been advised to check her blood pressure twice daily-an hour or two after taking her medications. She will call back at the end of the week with these readings. She has also been advised to stay hydrated especially with the heat.   (Side note, she stated that she has stopped the linzess and started [place])

## 2020-11-13 NOTE — Telephone Encounter (Signed)
Returned the call to the patient to see if she could go ahead and do a Banker. She stated that she was not sure how to do this from home.   Message sent to the device clinic to see if they can help the patient.

## 2020-11-16 ENCOUNTER — Other Ambulatory Visit: Payer: Self-pay

## 2020-11-16 ENCOUNTER — Encounter (INDEPENDENT_AMBULATORY_CARE_PROVIDER_SITE_OTHER): Payer: PPO | Admitting: Ophthalmology

## 2020-11-16 DIAGNOSIS — H43813 Vitreous degeneration, bilateral: Secondary | ICD-10-CM

## 2020-11-16 DIAGNOSIS — H35033 Hypertensive retinopathy, bilateral: Secondary | ICD-10-CM

## 2020-11-16 DIAGNOSIS — M329 Systemic lupus erythematosus, unspecified: Secondary | ICD-10-CM | POA: Diagnosis not present

## 2020-11-16 DIAGNOSIS — I10 Essential (primary) hypertension: Secondary | ICD-10-CM | POA: Diagnosis not present

## 2020-11-16 DIAGNOSIS — H353111 Nonexudative age-related macular degeneration, right eye, early dry stage: Secondary | ICD-10-CM | POA: Diagnosis not present

## 2020-11-17 ENCOUNTER — Other Ambulatory Visit: Payer: Self-pay | Admitting: Internal Medicine

## 2020-11-20 ENCOUNTER — Telehealth: Payer: Self-pay | Admitting: Internal Medicine

## 2020-11-20 NOTE — Telephone Encounter (Signed)
Follow up:    Patient calling back from last week to speak with the nurse concering her BP

## 2020-11-20 NOTE — Telephone Encounter (Signed)
Spoke with pt, she called to report she had an episode of not feeling well 'Sunday and she called to report her bp,  7/26= 115/62/69           111/57/92 7/27= 145/73/79           122/62/75           149/74/61 7/28= 122/64/85           151/81/70           142/80/64 7/29= 93/48/79           111/58/63 7/30= 90/60/88           88/47/65 7/31= 139/75/81           102/50/60           105/55/63           117/65/72           139/69/79           13'$ 4/66/66  She reports feeling fine today and has not checked her bp this morning. According to previous note her device sends transmissions every evening and there have been not alerts noted. Medications confirmed. Aware will forward to dr [name] to review.

## 2020-11-22 ENCOUNTER — Telehealth: Payer: Self-pay | Admitting: *Deleted

## 2020-11-22 ENCOUNTER — Telehealth: Payer: Self-pay | Admitting: Internal Medicine

## 2020-11-22 LAB — CUP PACEART REMOTE DEVICE CHECK
Battery Remaining Percentage: 95 %
Brady Statistic RA Percent Paced: 94 %
Brady Statistic RV Percent Paced: 0 %
Date Time Interrogation Session: 20220802091609
Implantable Lead Implant Date: 20220204
Implantable Lead Implant Date: 20220204
Implantable Lead Location: 753859
Implantable Lead Location: 753860
Implantable Lead Model: 377169
Implantable Lead Model: 377171
Implantable Lead Serial Number: 8000143637
Implantable Lead Serial Number: 8000151447
Implantable Pulse Generator Implant Date: 20220204
Lead Channel Impedance Value: 527 Ohm
Lead Channel Impedance Value: 546 Ohm
Lead Channel Pacing Threshold Amplitude: 0.8 V
Lead Channel Pacing Threshold Amplitude: 0.8 V
Lead Channel Pacing Threshold Pulse Width: 0.4 ms
Lead Channel Pacing Threshold Pulse Width: 0.4 ms
Lead Channel Sensing Intrinsic Amplitude: 1.5 mV
Lead Channel Sensing Intrinsic Amplitude: 11.3 mV
Lead Channel Setting Pacing Amplitude: 3 V
Lead Channel Setting Pacing Amplitude: 3 V
Lead Channel Setting Pacing Pulse Width: 0.4 ms
Pulse Gen Model: 407145
Pulse Gen Serial Number: 70020732

## 2020-11-22 NOTE — Telephone Encounter (Signed)
Spoke with patient regarding blood pressure readings.  Stated she called in earlier this week (8/1) regarding blood pressure readings and wanted to know if Dr [name] had reviewd  Explained to patient that Dr [name] working at the hospital this week but would be in office end of next week.  Will forward to Dr [name] for review

## 2020-11-22 NOTE — Telephone Encounter (Signed)
Pt c/o BP issue: STAT if pt c/o blurred vision, one-sided weakness or slurred speech  1. What are your last 5 BP readings? 155/81, 82/40, 102/57, 152/89, 112/60  2. Are you having any other symptoms (ex. Dizziness, headache, blurred vision, passed out)? Dizziness   3. What is your BP issue? Blood pressure is down in the 80's based on this pt is wanting to know if she should still take her medications the same way.. please advise.

## 2020-11-22 NOTE — Telephone Encounter (Signed)
Spoke with patient regarding blood pressure readings  She had another question/concern regarding her home monitoring.  Explained to to patient it looks like she is needing to remote pacer check on Friday 8/5 Patient stated she could not do that at home Advised would forward message to device clinic and have them reach out to patient regarding remote device check

## 2020-11-22 NOTE — Telephone Encounter (Signed)
The patient has a Biotronik monitor. She can not send manual transmissions. The patient has been having some low blood pressure, dizzy spells. Dr. Loni Muse office wanted [place] to look at the transmissions and see if the symptoms correlate with what is on the transmissions. I told the patient the nurse will give her a call back.

## 2020-11-22 NOTE — Telephone Encounter (Signed)
Successful telephone encounter to patient to follow up on her complaints of low BP. She is wanting to know if her device remote monitor is able to pick up events that my cause her BP to be in the high 80s intermittently. Reviewed most recent remote 11/21/20. No significant episodes noted. Patient reports SBP high 80s yesterday am however continued to increase throughout the day and last even was 155. Medications reviewed. Discussed significance of symptoms vs numbers. Patient denies any s/s of hypotension with SBP 80s. She will continue to monitor BP and symptoms at home and report any significant changes. Patient appreciative of call.

## 2020-11-23 NOTE — Telephone Encounter (Signed)
Please see other telephone encounter.

## 2020-11-23 NOTE — Telephone Encounter (Signed)
Advised patient, verbalized understanding  Elouise Munroe, MD to Me  [place], Belinda Block, RN      6:54 AM If she is dizzy with the low blood pressures, then have her hold her amlodipine til we talk next week.  GA

## 2020-11-23 NOTE — Telephone Encounter (Signed)
Left message to call back  

## 2020-11-24 ENCOUNTER — Ambulatory Visit (INDEPENDENT_AMBULATORY_CARE_PROVIDER_SITE_OTHER): Payer: PPO

## 2020-11-24 DIAGNOSIS — I495 Sick sinus syndrome: Secondary | ICD-10-CM | POA: Diagnosis not present

## 2020-11-30 ENCOUNTER — Other Ambulatory Visit: Payer: Self-pay | Admitting: Internal Medicine

## 2020-12-01 ENCOUNTER — Telehealth: Payer: Self-pay

## 2020-12-01 NOTE — Telephone Encounter (Signed)
pt has stated she needs her Ambien rx refilled. She states she was told to call in the office for this refill due to it needing to be changed to a 90-day supply as an error was made last refill in which it was filled for a 30 days.

## 2020-12-01 NOTE — Telephone Encounter (Signed)
I have never filled her ambien for 90 at a time. It has always been 30 with refills. She has refills and can contact pharmacy to fill.

## 2020-12-01 NOTE — Telephone Encounter (Signed)
Called patient. LDVM with  [place]'s recommendation. Office number was provided.

## 2020-12-01 NOTE — Telephone Encounter (Signed)
On 10/30/2020 she received 30 with 5 refills. Does she just need to call the pharmacy? Please advise

## 2020-12-13 ENCOUNTER — Telehealth: Payer: Self-pay | Admitting: Pharmacist

## 2020-12-13 NOTE — Progress Notes (Signed)
Chronic Care Management Pharmacy Assistant   Name: Sheila Morales  MRN: [id number] DOB: May 28, 1950   Reason for Encounter: Disease State   Conditions to be addressed/monitored: General   Recent office visits:  None ID  Recent consult visits:  09/07/20 [name], MD-Cardiology (Sinus node dysfunction)  08/24/20 Collene Gobble, MD-Pulmonary (abdominal pain)  START Amitiza (Lubiprostone) 24 mcg 1 capsule twice daily.  Hospital visits:  None in previous 6 months  Medications: Outpatient Encounter Medications as of 12/13/2020  Medication Sig   albuterol (VENTOLIN HFA) 108 (90 Base) MCG/ACT inhaler Inhale 1-2 puffs into the lungs every 6 (six) hours as needed for wheezing or shortness of breath.   aspirin 81 MG tablet Take 81 mg by mouth daily.   BLACK COHOSH PO Take 1 capsule by mouth daily.   Calcium Carb-Cholecalciferol (CALCIUM 600 + D PO) Take 1 tablet by mouth daily.   calcium carbonate (OS-CAL - DOSED IN MG OF ELEMENTAL CALCIUM) 1250 (500 Ca) MG tablet Take 1 tablet by mouth daily as needed (heartburn).   Cholecalciferol (VITAMIN D) 50 MCG (2000 UT) tablet Take 2,000 Units by mouth daily.   dicyclomine (BENTYL) 10 MG capsule Take 1 capsule (10 mg total) by mouth 4 (four) times daily -  before meals and at bedtime.   doxazosin (CARDURA) 1 MG tablet TAKE 1 TABLET BY MOUTH EVERY DAY   folic acid (FOLVITE) 1 MG tablet Take 1 mg by mouth daily.   gabapentin (NEURONTIN) 300 MG capsule TAKE TWO CAPSULES BY MOUTH EVERY MORNING, TWO capsules each afternoon AND THREE CAPSULES AT bedtime   hydroxychloroquine (PLAQUENIL) 200 MG tablet Take 200 mg by mouth 2 (two) times daily.   ibuprofen (ADVIL) 200 MG tablet Take 400 mg by mouth every 6 (six) hours as needed for headache or mild pain.   linaclotide (LINZESS) 72 MCG capsule Take 1 capsule (72 mcg total) by mouth daily before breakfast. (Patient not taking: Reported on 11/13/2020)   methotrexate 2.5 MG tablet Take 10 mg by mouth every  Thursday.   Misc Natural Products (ADVANCED JOINT RELIEF) CAPS Take 1 capsule by mouth daily. Joint Advantage Gold 5x   Multiple Vitamin (MULTIVITAMIN WITH MINERALS) TABS tablet Take 1 tablet by mouth daily.   Omega-3 Fatty Acids (OMEGA 3 PO) Take 1 capsule by mouth daily.   OVER THE COUNTER MEDICATION Take 1 capsule by mouth daily. brain essentials otc supplement   Polyethyl Glycol-Propyl Glycol (SYSTANE OP) Place 1 drop into both eyes 2 (two) times daily.   RESTASIS 0.05 % ophthalmic emulsion Place 1 drop into both eyes 2 (two) times daily.   saccharomyces boulardii (FLORASTOR) 250 MG capsule Take 250 mg by mouth 2 (two) times daily.   telmisartan (MICARDIS) 40 MG tablet TAKE TWO TABLETS BY MOUTH ONCE DAILY   umeclidinium-vilanterol (ANORO ELLIPTA) 62.5-25 MCG/INH AEPB Inhale 1 puff into the lungs daily. Inhale 1 puff daily   vitamin C (ASCORBIC ACID) 500 MG tablet Take 500 mg by mouth daily.   vitamin E 180 MG (400 UNITS) capsule Take 400 Units by mouth daily.   zolpidem (AMBIEN) 5 MG tablet Take 1 tablet (5 mg total) by mouth at bedtime as needed for sleep.   No facility-administered encounter medications on file as of 12/13/2020.    Contacted Sheila Morales on 12/13/20 for general disease state and medication adherence call.   Patient is not > 5 days past due for refill on the following medications per chart history:  [facility] Medications: Medication Name/mg Last Fill Days Supply Telmisartan 40 mg  11/13/20 90   What concerns do you have about your medications? Patient states that she is taking doxazosin but is not sure if she is suppose to stay on this medication or not.  The patient denies side effects with her medications.   How often do you forget or accidentally miss a dose? Patient states she take her medications regularly  Do you use a pillbox? Yes  Are you having any problems getting your medications from your pharmacy? No  Has the cost of your medications been a concern?  No If yes, what medication and is patient assistance available or has it been applied for?  Since last visit with CPP, no interventions have been made:   The patient has not had an ED visit since last contact.   The patient reports problems with their health. Patient states that she has back and hip pain  she reports the following  concerns or questions for [place]. D at this time. Patient states that she has really bad back  and hip pain, has not seen anyone about the issue. Would like to be recommended to someone that can help.  Counseled patient on:  Great job taking medications   Care Gaps: Last annual wellness visit:Routine general medical examination at a health care facility 03/29/2019 If Diabetic: Last eye exam / retinopathy screening: Last diabetic foot exam:  CCM appointment on 01/24/21    [place] Pharmacist Assistant [phone]   Time spent:40

## 2020-12-14 NOTE — Progress Notes (Signed)
Remote pacemaker transmission.   

## 2020-12-19 NOTE — Progress Notes (Signed)
    Chronic Care Management Pharmacy Assistant   Name: JERICHA WHITTINGHAM  MRN: [id number] DOB: 1950/09/24  A call was made to Ms. Harlacher to let her know if she is still having issues with her back and her hip she can call her PCP and ask for a referral to sports medicine. Patient stated that she was going to wait, she the pain is bearable for now but she will check into it the future if it gets worse.  [place] Pharmacist Assistant [phone]   Time spent:15

## 2020-12-22 ENCOUNTER — Telehealth: Payer: Self-pay | Admitting: Internal Medicine

## 2020-12-22 NOTE — Telephone Encounter (Signed)
Pt c/o BP issue: STAT if pt c/o blurred vision, one-sided weakness or slurred speech  1. What are your last 5 BP readings?  12/21/20 87/44 HR 56 12:45 PM 128/71 HR 70 2:20 PM after getting up  12/22/20 105/65 HR 88 2:30 PM  12/12/20 78/38 HR 75  2. Are you having any other symptoms (ex. Dizziness, headache, blurred vision, passed out)? Dizziness   3. What is your BP issue? Hypotension with dizziness. Been going on for the past few months.    STAT if patient feels like he/she is going to faint   Are you dizzy now? No   Do you feel faint or have you passed out? No   Do you have any other symptoms? Hypotensions   Have you checked your HR and BP (record if available)? See previous dot phrase.

## 2020-12-22 NOTE — Telephone Encounter (Signed)
Returned call to patient, advised her of the following:  Sheila Munroe, MD  You 6 minutes ago (3:35 PM)   Stop doxazosin. Call us in 1 week with symptom update.  GA    Patient aware and will stop Doxazosin and Monitor BP daily and will call back next week with update. Advised patient to call back to office with any issues, questions, or concerns. Patient verbalized understanding.

## 2020-12-22 NOTE — Telephone Encounter (Signed)
Returned call to patient who states that she has been having some intermittent hypotension. Patient states that yesterday her BP was 87/44 with HR 56 at 12:45pm, and states she was dizzy during this time. Patient reports that a little bit later her BP came up to 128/71 with HR 70. 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 Patient states she does have to stop and take breaks as well. Patient states that today her BP is 105/65 with HR 88. Patient states that she has not had any symptoms today.   Patient is currently taking: Doxazosin '1mg'$  daily and Telmisartan '40mg'$  BID for blood pressure.   Advised patient I would forward message to Dr. [name] for her to review and advise. Made patient aware of ED precautions should new or worsening symptoms develop. Patient verbalized understanding.

## 2020-12-26 ENCOUNTER — Other Ambulatory Visit: Payer: Self-pay | Admitting: Emergency Medicine

## 2020-12-28 ENCOUNTER — Other Ambulatory Visit: Payer: Self-pay | Admitting: Internal Medicine

## 2020-12-29 ENCOUNTER — Telehealth: Payer: Self-pay | Admitting: Internal Medicine

## 2020-12-29 DIAGNOSIS — M329 Systemic lupus erythematosus, unspecified: Secondary | ICD-10-CM | POA: Diagnosis not present

## 2020-12-29 NOTE — Telephone Encounter (Signed)
I spoke with patient. She is calling to give update after stopping doxazosin.  She reports BP continues to be up and down.  Occasional dizziness but no new symptoms. She reports the following readings 9/9- 102/53, 63 9/8-138/69, 71 9.7-121/68, 63 9/6- 164/93, 65 and 111/30, 70 9/5- did not check 9/4-103/60, 73 and 108/57, 80 Lowest reading was SBP of 87 on 9/1. She will stay off doxazosin for now until readings reviewed by Dr [name].

## 2020-12-29 NOTE — Telephone Encounter (Signed)
Patient called in wanting to know, if Sheila Morales should be taking doxazosin mesyalte 1 mg 1 daily. It was  prescribe by her Dr Sharlet Salina but that was when her bp was first low. Please advise

## 2021-01-10 ENCOUNTER — Other Ambulatory Visit: Payer: Self-pay | Admitting: Internal Medicine

## 2021-01-16 ENCOUNTER — Telehealth: Payer: Self-pay | Admitting: Internal Medicine

## 2021-01-16 NOTE — Telephone Encounter (Signed)
Patient said she got a new bed recently. She is concerned because there is a motor and with the motor on for certain features of the bed to work if it would interact with her pacemaker. Please advise

## 2021-01-16 NOTE — Telephone Encounter (Signed)
Consulted with Ruby Cola and recommends patient call [employer]ik to confirm. Phone number given to patient. [phone].

## 2021-01-18 ENCOUNTER — Telehealth: Payer: Self-pay | Admitting: Internal Medicine

## 2021-01-18 NOTE — Telephone Encounter (Signed)
Returning phone call. No answer, LMTCB.

## 2021-01-18 NOTE — Telephone Encounter (Signed)
Patient called in wanting to let us know she has a magnet in her bed and wants a nurse to call her back to reassure her if it is ok or not

## 2021-01-18 NOTE — Telephone Encounter (Signed)
Called patient to follow up to see how her BP has been running.   Patient reports her BP has been running a little bit higher since stopping the Doxazosin.   Patient reports that her BP yesterday was 135/77 with HR 70 Patient reports that it has varied from 130's up the highest being 695 systolic on 0/72 but states that it is not this high usually. Patient reports the only low reading she has gotten was 110/57 over the last few weeks. Patient reports that over all she has had no dizziness since stopping the Doxazosin. Patient reports that she is fatigued but has had issues with her new bed and not sleeping well along with back pain. Patient state that she thinks that this is what is contributing to her fatigue. Patient would like to know what she should do regarding her BP and if she should remain off of the Doxazosin. Advised patient I would forward message to Dr. [name] for her to review and advise. Patient verbalized understanding.

## 2021-01-18 NOTE — Telephone Encounter (Signed)
Please see other telephone encounter. Will close this one.

## 2021-01-22 NOTE — Telephone Encounter (Signed)
Pt returned phone call.  Advised patient that as long s the magnet remains at least 6 inches away from her pacemaker it will not affect her device.  She states her mattress is thicker than 6 inches.

## 2021-01-24 ENCOUNTER — Ambulatory Visit (INDEPENDENT_AMBULATORY_CARE_PROVIDER_SITE_OTHER): Payer: PPO | Admitting: Pharmacist

## 2021-01-24 ENCOUNTER — Other Ambulatory Visit: Payer: Self-pay

## 2021-01-24 DIAGNOSIS — J449 Chronic obstructive pulmonary disease, unspecified: Secondary | ICD-10-CM

## 2021-01-24 DIAGNOSIS — K581 Irritable bowel syndrome with constipation: Secondary | ICD-10-CM

## 2021-01-24 DIAGNOSIS — I10 Essential (primary) hypertension: Secondary | ICD-10-CM

## 2021-01-24 DIAGNOSIS — M329 Systemic lupus erythematosus, unspecified: Secondary | ICD-10-CM

## 2021-01-24 NOTE — Patient Instructions (Signed)
Visit Information  Phone number for Pharmacist: [phone]   Goals Addressed             This Visit's Progress    Track and Manage My Blood Pressure-Hypertension       Timeframe:  Long-Range Goal Priority:  High Start Date:    01/24/21                         Expected End Date:   01/24/22                    Follow Up Date April 2023   - check blood pressure 3 times per week - choose a place to take my blood pressure (home, clinic or office, retail store) - write blood pressure results in a log or diary -Follow up with cardiology regularly for medication adjustments    Why is this important?   You won't feel high blood pressure, but it can still hurt your blood vessels.  High blood pressure can cause heart or kidney problems. It can also cause a stroke.  Making lifestyle changes like losing a little weight or eating less salt will help.  Checking your blood pressure at home and at different times of the day can help to control blood pressure.  If the doctor prescribes medicine remember to take it the way the doctor ordered.  Call the office if you cannot afford the medicine or if there are questions about it.     Notes:         Care Plan : [place]  Updates made by Charlton Haws, RPH since 01/24/2021 12:00 AM     Problem: Hypertension, COPD, Lupus, IBS   Priority: High     Long-Range Goal: Disease management   Start Date: 07/25/2020  Expected End Date: 01/24/2022  This Visit's Progress: On track  Recent Progress: On track  Priority: High  Note:   Current Barriers:  Unable to independently monitor therapeutic efficacy Unable to maintain control of BP  Pharmacist Clinical Goal(s):  Patient will achieve adherence to monitoring guidelines and medication adherence to achieve therapeutic efficacy adhere to plan to optimize therapeutic regimen for BP as evidenced by report of adherence to recommended medication management changes through  collaboration with PharmD and provider.   Interventions: 1:1 collaboration with Hoyt Koch, MD regarding development and update of comprehensive plan of care as evidenced by provider attestation and co-signature Inter-disciplinary care team collaboration (see longitudinal plan of care) Comprehensive medication review performed; medication list updated in electronic medical record  Hypertension    BP goal is:  <130/80 Patient checks BP at home daily Patient home BP readings are ranging: variable - SBP 130-160 S/p Pacemaker 05/2020   Patient has failed these meds in the past: amlodipine, doxazosin (dizziness) Patient is currently controlled on the following medications:  Telmisartan 40 mg - 2 tab daily   We discussed: pt reports dizziness has resolved since stopping doxazosin pt does endorse BP has been "up and down" lately; she has spoken to cardiologist about this issue   Plan: Advised pt to consult with cardiology regarding variable BP   COPD    Last spirometry score: FEV1/FVC 0.59, FEV1 57% predicted Gold Grade: Gold 2 (FEV1 50-79%) Current COPD Classification:  A (low sx, <2 exacerbations/yr)  Patient is currently controlled on the following medications:  Anoro  (umeclidinium/vilatnerol) 1 puff daily,  albuterol HFA prn   Using maintenance inhaler  regularly? Yes Frequency of rescue inhaler use:  multiple times per day -cleans houses, uses albuterol in AM and PM    We discussed:  pt endorses compliance with Anoro and denies issues   Plan: Continue current medications    Lupus    Patient has failed these meds in past: n/a Patient is currently controlled on the following medications:  methotrexate 10 mg (2.5 mg x 4) once a week,  Folic acid 1 mg daily,  Hydroxychloroquine 200 mg BID   We discussed:  Since starting MTX she has not had a lupus outbreak. Endorses compliance with daily folic acid and weekly MTX.   Plan: Continue current medications   IBS - C     Hx small bowel obsruction.  Patient has failed these meds in past: dicyclomine, lubiprostone (diarrhea), Linzess Patient is currently controlled on the following medications:  Florastor probiotic   Benefiber prn Dicyclomine 10 mg QID prn Miralax PRN   We discussed: Pt eats prunes to help with constipation, she reports this helps mreo than anythign else; she is satisfied with current regimen and denies issues   Plan: Continue current medications   Patient Goals/Self-Care Activities Patient will:  - take medications as prescribed -focus on medication adherence by pill box  -Contact cardiology regarding variable BP      Patient verbalizes understanding of instructions provided today and agrees to view in [place].  Telephone follow up appointment with pharmacy team member scheduled for: 6 months  Charlene Brooke, PharmD, [place], CPP Clinical Pharmacist [facility] [phone]

## 2021-01-24 NOTE — Progress Notes (Signed)
Chronic Care Management Pharmacy Note  01/24/2021 Name:  Sheila Morales MRN:  [id number] DOB:  1950/08/06  Summary: -BP has been highly variable for a few months now; cardiology has stopped amlodipine and then doxazosin due to dizziness, pt reports dizziness resolved after stopping doxazosin, BP is still "up and down"  Recommendations/Changes made from today's visit: -Advised pt to follow up with cardiology regarding variable BP   Subjective: Sheila Morales is an 70 y.o. year old female who is a primary patient of Hoyt Koch, MD.  The CCM team was consulted for assistance with disease management and care coordination needs.    Engaged with patient by telephone for follow up visit in response to provider referral for pharmacy case management and/or care coordination services.   Consent to Services:  The patient was given information about Chronic Care Management services, agreed to services, and gave verbal consent prior to initiation of services.  Please see initial visit note for detailed documentation.   Patient Care Team: Hoyt Koch, MD as PCP - General (Internal Medicine) Elouise Munroe, MD as PCP - Cardiology (Cardiology) Gavin Pound, MD (Rheumatology) Gus Height, MD (Inactive) (Obstetrics and Gynecology) Alda Berthold, DO as Consulting Physician (Neurology) Charlton Haws, [facility] as Pharmacist (Pharmacist)  Recent office visits: 06/29/20 Dr Sharlet Salina OV: L foot pain. Try inserts, filing.  Recent consult visits: 12/22/20 cardiology TE: hold doxazosin 11/22/20 cardiology TE: hold amlodipine until appt. 09/27/20 GI TE : given Trulance and Linzess samples.  09/07/20 Dr Lovena Le (cardiology): f/u sinus node dysfxn. d/c amitiza, loratadine, triamcinolone d/t not taking.  08/24/20 Dr Lamonte Sakai (pulmonary): f/u COPD. Refilled Anoro.  08/01/20 NP Chester Holstein (GI): c/o constipation. Rx'd Amitiza.  07/21/20 Dr [name] (cardiology): f/u PPM, pt has more energy but having  GI issues. Advised to f/u with PCP. No changes.  07/18/20 GI phone call: c/o chronic constipation. Advised to increase Miralax to daily, start a fiber supplement.  05/26/20 Pacemaker procedure  05/11/20 Dr Lovena Le (cardiology): stopped smoking x 4 months.   04/24/20 Dr [name] (cardiology): referred to EP for pacemaker consult. Plan sleep study. Continue same BP meds  Hospital visits: None in previous 6 months  Objective:  Lab Results  Component Value Date   CREATININE 0.78 05/11/2020   BUN 20 05/11/2020   GFR 62.66 01/21/2020   GFRNONAA 77 05/11/2020   GFRAA 89 05/11/2020   NA 139 05/11/2020   K 4.6 05/11/2020   CALCIUM 9.5 05/11/2020   CO2 30 (H) 05/11/2020   GLUCOSE 80 05/11/2020    Lab Results  Component Value Date/Time   HGBA1C 5.7 02/24/2017 01:51 PM   GFR 62.66 01/21/2020 11:35 AM   GFR 82.87 03/29/2019 09:09 AM    Last diabetic Eye exam: No results found for: HMDIABEYEEXA  Last diabetic Foot exam: No results found for: HMDIABFOOTEX   Lab Results  Component Value Date   CHOL 148 03/29/2019   HDL 63.60 03/29/2019   LDLCALC 71 03/29/2019   LDLDIRECT 52.0 02/24/2017   TRIG 66.0 03/29/2019   CHOLHDL 2 03/29/2019    Hepatic Function Latest Ref Rng & Units 01/21/2020 11/22/2019 03/29/2019  Total Protein 6.0 - 8.3 g/dL 7.5 7.5 6.8  Albumin 3.5 - 5.2 g/dL 4.1 - 3.7  AST 0 - 37 U/L 33 35 33  ALT 0 - 35 U/L 29 33(H) 27  Alk Phosphatase 39 - 117 U/L 77 - 88  Total Bilirubin 0.2 - 1.2 mg/dL 0.6 0.4 0.6    Lab Results  Component Value Date/Time   TSH 0.99 08/01/2020 03:49 PM   TSH 1.45 03/29/2019 09:09 AM    CBC Latest Ref Rng & Units 05/11/2020 01/21/2020 11/22/2019  WBC 3.4 - 10.8 x10E3/uL 4.2 4.2 4.0  Hemoglobin 11.1 - 15.9 g/dL 11.8 13.1 14.7  Hematocrit 34.0 - 46.6 % 35.0 38.6 42.6  Platelets 150 - 450 x10E3/uL 153 206.0 165    Lab Results  Component Value Date/Time   VD25OH 78.74 03/29/2019 09:09 AM    Clinical ASCVD: No  The 10-year ASCVD risk score  (Arnett DK, et al., 2019) is: 12.7%   Values used to calculate the score:     Age: 57 years     Sex: Female     Is Non-Hispanic African American: No     Diabetic: No     Tobacco smoker: No     Systolic Blood Pressure: 761 mmHg     Is BP treated: Yes     HDL Cholesterol: 63.6 mg/dL     Total Cholesterol: 148 mg/dL    Depression screen [facility] 2/9 04/03/2020 09/27/2019 02/24/2017  Decreased Interest 0 0 0  Down, Depressed, Hopeless 1 0 0  PHQ - 2 Score 1 0 0  Some recent data might be hidden     Social History   Tobacco Use  Smoking Status Former   Packs/day: 0.25   Years: 30.00   Pack years: 7.50   Types: Cigarettes   Quit date: 01/21/2020   Years since quitting: 1.0  Smokeless Tobacco Never  Tobacco Comments   smokes "every now and then" 08/24/20 [facility]    BP Readings from Last 3 Encounters:  09/07/20 128/72  08/24/20 140/76  08/01/20 120/60   Pulse Readings from Last 3 Encounters:  09/07/20 73  08/24/20 70  08/01/20 64   Wt Readings from Last 3 Encounters:  09/07/20 138 lb (62.6 kg)  08/24/20 133 lb 12.8 oz (60.7 kg)  08/01/20 131 lb (59.4 kg)   BMI Readings from Last 3 Encounters:  09/07/20 24.45 kg/m  08/24/20 23.70 kg/m  08/01/20 23.96 kg/m    Assessment/Interventions: Review of patient past medical history, allergies, medications, health status, including review of consultants reports, laboratory and other test data, was performed as part of comprehensive evaluation and provision of chronic care management services.   SDOH:  (Social Determinants of Health) assessments and interventions performed: Yes  SDOH Screenings   Alcohol Screen: Low Risk    Last Alcohol Screening Score (AUDIT): 0  Depression (PHQ2-9): Low Risk    PHQ-2 Score: 1  Financial Resource Strain: Low Risk    Difficulty of Paying Living Expenses: Not hard at all  Food Insecurity: No Food Insecurity   Worried About Charity fundraiser in the Last Year: Never true   Ran Out of Food in the  Last Year: Never true  Housing: Low Risk    Last Housing Risk Score: 0  Physical Activity: Sufficiently Active   Days of Exercise per Week: 5 days   Minutes of Exercise per Session: 30 min  Social Connections: Moderately Integrated   Frequency of Communication with Friends and Family: More than three times a week   Frequency of Social Gatherings with Friends and Family: More than three times a week   Attends Religious Services: More than 4 times per year   Active Member of [employer] or Organizations: No   Attends Archivist Meetings: Never   Marital Status: Married  Stress: No Stress Concern Present   Feeling  of Stress : Not at all  Tobacco Use: Medium Risk   Smoking Tobacco Use: Former   Smokeless Tobacco Use: Never  Transportation Needs: No Data processing manager (Medical): No   Lack of Transportation (Non-Medical): No    CCM Care Plan  No Known Allergies  Medications Reviewed Today     Reviewed by Charlton Haws, [facility] (Pharmacist) on 01/24/21 at 1333  Med List Status: <None>   Medication Order Taking? Sig Documenting Provider Last Dose Status Informant  albuterol (VENTOLIN HFA) 108 (90 Base) MCG/ACT inhaler [id number] Yes Inhale 1-2 puffs into the lungs every 6 (six) hours as needed for wheezing or shortness of breath. Collene Gobble, MD Taking Active   ANORO ELLIPTA 62.5-25 MCG/INH AEPB [id number] Yes INHALE 1 PUFF INTO THE LUNGS DAILY. INHALE 1 PUFF DAILY Collene Gobble, MD Taking Active   aspirin 81 MG tablet [id number] Yes Take 81 mg by mouth daily. [provider] Taking Active Self  BLACK COHOSH PO [id number] Yes Take 1 capsule by mouth daily. [provider] Taking Active Self  Calcium Carb-Cholecalciferol (CALCIUM 600 + D PO) [id number] Yes Take 1 tablet by mouth daily. [provider] Taking Active Self  calcium carbonate (OS-CAL - DOSED IN MG OF ELEMENTAL CALCIUM) 1250 (500 Ca) MG tablet [id number] Yes Take 1  tablet by mouth daily as needed (heartburn). [provider] Taking Active Self  Cholecalciferol (VITAMIN D) 50 MCG (2000 UT) tablet [id number] Yes Take 2,000 Units by mouth daily. [provider] Taking Active Self  dicyclomine (BENTYL) 10 MG capsule [id number] Yes Take 1 capsule (10 mg total) by mouth 4 (four) times daily -  before meals and at bedtime. Hoyt Koch, MD Taking Active Self  folic acid (FOLVITE) 1 MG tablet [id number] Yes Take 1 mg by mouth daily. [provider] Taking Active Self  gabapentin (NEURONTIN) 300 MG capsule [id number] Yes TAKE TWO CAPSULES BY MOUTH EVERY MORNING, TWO capsules each afternoon AND THREE CAPSULES AT bedtime Hoyt Koch, MD Taking Active   hydroxychloroquine (PLAQUENIL) 200 MG tablet [id number] Yes Take 200 mg by mouth 2 (two) times daily. [provider] Taking Active Self  ibuprofen (ADVIL) 200 MG tablet [id number] Yes Take 400 mg by mouth every 6 (six) hours as needed for headache or mild pain. [provider] Taking Active Self  methotrexate 2.5 MG tablet [id number] Yes Take 10 mg by mouth every Thursday. [provider] Taking Active Self  Misc Natural Products (ADVANCED JOINT RELIEF) CAPS [id number] Yes Take 1 capsule by mouth daily. Joint Advantage Gold 5x [provider] Taking Active Self  Multiple Vitamin (MULTIVITAMIN WITH MINERALS) TABS tablet [id number] Yes Take 1 tablet by mouth daily. [provider] Taking Active Self  Omega-3 Fatty Acids (OMEGA 3 PO) [id number] Yes Take 1 capsule by mouth daily. [provider] Taking Active Self  OVER THE COUNTER MEDICATION [id number] Yes Take 1 capsule by mouth daily. brain essentials otc supplement [provider] Taking Active Self  Polyethyl Glycol-Propyl Glycol (SYSTANE OP) [id number] Yes Place 1 drop into both eyes 2 (two) times daily. [provider] Taking Active Self  RESTASIS 0.05 % ophthalmic  emulsion [id number] Yes Place 1 drop into both eyes 2 (two) times daily. [provider] Taking Active Self  saccharomyces boulardii (FLORASTOR) 250 MG capsule [id number] Yes Take 250 mg by mouth 2 (two) times daily. [provider] Taking Active Self  telmisartan (MICARDIS) 40 MG tablet [id number] Yes TAKE  TWO TABLETS BY MOUTH ONCE DAILY Burns, Claudina Lick, MD Taking Active   vitamin C (ASCORBIC ACID) 500 MG tablet [id number] Yes Take 500 mg by mouth daily. [provider] Taking Active Self  vitamin E 180 MG (400 UNITS) capsule [id number] Yes Take 400 Units by mouth daily. [provider] Taking Active Self  zolpidem (AMBIEN) 5 MG tablet [id number] Yes Take 1 tablet (5 mg total) by mouth at bedtime as needed for sleep. Hoyt Koch, MD Taking Active             Patient Active Problem List   Diagnosis Date Noted   Pacemaker 09/07/2020   Left foot pain 06/30/2020   Sinus node dysfunction ([place]) 05/26/2020   Bradycardia 05/11/2020   Pain in right lower leg 02/18/2020   Episodic lightheadedness 02/02/2020   Hypertension 02/02/2020   LLQ pain 11/23/2019   Cyst of buttocks 10/05/2019   Methotrexate, long term, current use 07/29/2019   BPPV (benign paroxysmal positional vertigo) 12/16/2017   Insomnia 02/24/2017   COPD (chronic obstructive pulmonary disease) ([place]) 01/22/2017   Routine general medical examination at a health care facility 02/19/2016   Hereditary and idiopathic peripheral neuropathy 02/17/2015   Smokers' cough ([place]) 12/07/2014   Systemic lupus erythematosus ([place]) 10/13/2013   Cough 01/18/2009   Tobacco use 01/02/2009   Rash 05/31/2008   Irritable bowel syndrome 03/25/2008   RHEUMATIC FEVER, HX OF 01/17/2007    Immunization History  Administered Date(s) Administered   Fluad Quad(high Dose 65+) 03/29/2019   H1N1 05/31/2008   Influenza Split 02/13/2012   Influenza Whole 01/21/2008, 01/17/2009   Influenza, High Dose Seasonal PF  02/19/2016, 02/05/2018   Influenza,inj,Quad PF,6+ Mos 01/21/2014   Influenza-Unspecified 02/02/2015, 02/03/2017   Pneumococcal Conjugate-13 02/19/2016   Pneumococcal Polysaccharide-23 02/24/2017   Tdap 03/29/2019    Conditions to be addressed/monitored:  Hypertension, COPD, Tobacco use and Lupus, IBS  Care Plan : [place]  Updates made by Charlton Haws, [place] since 01/24/2021 12:00 AM     Problem: Hypertension, COPD, Lupus, IBS   Priority: High     Long-Range Goal: Disease management   Start Date: 07/25/2020  Expected End Date: 01/24/2022  This Visit's Progress: On track  Recent Progress: On track  Priority: High  Note:   Current Barriers:  Unable to independently monitor therapeutic efficacy Unable to maintain control of BP  Pharmacist Clinical Goal(s):  Patient will achieve adherence to monitoring guidelines and medication adherence to achieve therapeutic efficacy adhere to plan to optimize therapeutic regimen for BP as evidenced by report of adherence to recommended medication management changes through collaboration with PharmD and provider.   Interventions: 1:1 collaboration with Hoyt Koch, MD regarding development and update of comprehensive plan of care as evidenced by provider attestation and co-signature Inter-disciplinary care team collaboration (see longitudinal plan of care) Comprehensive medication review performed; medication list updated in electronic medical record  Hypertension    BP goal is:  <130/80 Patient checks BP at home daily Patient home BP readings are ranging: variable - SBP 130-160 S/p Pacemaker 05/2020   Patient has failed these meds in the past: amlodipine, doxazosin (dizziness) Patient is currently controlled on the following medications:  Telmisartan 40 mg - 2 tab daily   We discussed: pt reports dizziness has resolved since stopping doxazosin pt does endorse BP has been "up and down" lately; she has spoken to  cardiologist about this issue   Plan: Advised pt to consult with cardiology regarding variable BP  COPD    Last spirometry score: FEV1/FVC 0.59, FEV1 57% predicted Gold Grade: Gold 2 (FEV1 50-79%) Current COPD Classification:  A (low sx, <2 exacerbations/yr)  Patient is currently controlled on the following medications:  Anoro  (umeclidinium/vilatnerol) 1 puff daily,  albuterol HFA prn   Using maintenance inhaler regularly? Yes Frequency of rescue inhaler use:  multiple times per day -cleans houses, uses albuterol in AM and PM    We discussed:  pt endorses compliance with Anoro and denies issues   Plan: Continue current medications    Lupus    Patient has failed these meds in past: n/a Patient is currently controlled on the following medications:  methotrexate 10 mg (2.5 mg x 4) once a week,  Folic acid 1 mg daily,  Hydroxychloroquine 200 mg BID   We discussed:  Since starting MTX she has not had a lupus outbreak. Endorses compliance with daily folic acid and weekly MTX.   Plan: Continue current medications   IBS - C    Hx small bowel obsruction.  Patient has failed these meds in past: dicyclomine, lubiprostone (diarrhea), Linzess Patient is currently controlled on the following medications:  Florastor probiotic   Benefiber prn Dicyclomine 10 mg QID prn Miralax PRN   We discussed: Pt eats prunes to help with constipation, she reports this helps mreo than anythign else; she is satisfied with current regimen and denies issues   Plan: Continue current medications   Patient Goals/Self-Care Activities Patient will:  - take medications as prescribed -focus on medication adherence by pill box  -Contact cardiology regarding variable BP       Compliance/Adherence/Medication fill history: Care Gaps: Mammogram (02/26/18)  Star-Rating Drugs: Telmisartan - LF 11/13/20 x 90 ds  Medication Assistance: None required.  Patient affirms current coverage meets  needs.  Patient's [facility] pharmacy [facility]  [id number]Phone: [phone]: [phone] Uses pill box? Yes Pt endorses 100% compliance  We discussed: Current pharmacy is preferred with insurance plan and patient is satisfied with pharmacy services Patient decided to: Continue current medication management strategy  Care Plan and Follow Up Patient Decision:  Patient agrees to Care Plan and Follow-up.  Plan: Telephone follow up appointment with care management team member scheduled for:  6 months  LCharlene Brooke PharmD, BRobbins CPP Clinical Pharmacist [facility] at [phone]

## 2021-01-25 ENCOUNTER — Other Ambulatory Visit: Payer: Self-pay | Admitting: Emergency Medicine

## 2021-02-07 ENCOUNTER — Telehealth: Payer: Self-pay

## 2021-02-07 NOTE — Telephone Encounter (Signed)
[employer] alert received for RA sensing below limit. Also noted outputs need to be changed to chronic settings. Will need industry assistance (noted place in apt. Notes). Patient called and updated, device clinic apt. Made 01/16/21. Location, date and time discussed with patient.   This was the soonest appointment available that patient could make.

## 2021-02-14 NOTE — Telephone Encounter (Signed)
Nightly transmission reflects numbers are back within normal range.  Reviewed with [employer] rep and determined appt tomorrow is unnecessary.  Spoke with pt.  She is feeling good, denies any cardiac symptoms.  Appt cancelled.

## 2021-02-19 DIAGNOSIS — J449 Chronic obstructive pulmonary disease, unspecified: Secondary | ICD-10-CM | POA: Diagnosis not present

## 2021-02-19 DIAGNOSIS — I10 Essential (primary) hypertension: Secondary | ICD-10-CM | POA: Diagnosis not present

## 2021-02-21 LAB — CUP PACEART REMOTE DEVICE CHECK
Battery Remaining Percentage: 60 %
Brady Statistic RA Percent Paced: 96 %
Brady Statistic RV Percent Paced: 0 %
Date Time Interrogation Session: 20221102160610
Implantable Lead Implant Date: 20220204
Implantable Lead Implant Date: 20220204
Implantable Lead Location: 753859
Implantable Lead Location: 753860
Implantable Lead Model: 377169
Implantable Lead Model: 377171
Implantable Lead Serial Number: 8000143637
Implantable Lead Serial Number: 8000151447
Implantable Pulse Generator Implant Date: 20220204
Lead Channel Impedance Value: 527 Ohm
Lead Channel Impedance Value: 546 Ohm
Lead Channel Pacing Threshold Amplitude: 1 V
Lead Channel Pacing Threshold Pulse Width: 0.4 ms
Lead Channel Sensing Intrinsic Amplitude: 1.1 mV
Lead Channel Sensing Intrinsic Amplitude: 9.9 mV
Lead Channel Setting Pacing Amplitude: 3 V
Lead Channel Setting Pacing Amplitude: 3 V
Lead Channel Setting Pacing Pulse Width: 0.4 ms
Pulse Gen Model: 407145
Pulse Gen Serial Number: 70020732

## 2021-02-25 ENCOUNTER — Other Ambulatory Visit: Payer: Self-pay | Admitting: Emergency Medicine

## 2021-02-26 ENCOUNTER — Other Ambulatory Visit: Payer: Self-pay | Admitting: Internal Medicine

## 2021-02-26 ENCOUNTER — Ambulatory Visit: Payer: PPO

## 2021-02-28 ENCOUNTER — Other Ambulatory Visit: Payer: Self-pay | Admitting: Emergency Medicine

## 2021-03-08 ENCOUNTER — Telehealth: Payer: Self-pay

## 2021-03-08 NOTE — Telephone Encounter (Signed)
LVM for patient to call back to schedule a physical before 04/20/21

## 2021-03-13 ENCOUNTER — Telehealth: Payer: Self-pay

## 2021-03-13 NOTE — Telephone Encounter (Signed)
Letter has been sent to patient informing them that their sleep study has expired. Patient will need to call and schedule an office visit to re-evaluate the need for a sleep study.    

## 2021-03-19 ENCOUNTER — Encounter (HOSPITAL_COMMUNITY): Payer: Self-pay

## 2021-03-19 ENCOUNTER — Emergency Department (HOSPITAL_COMMUNITY): Payer: PPO

## 2021-03-19 ENCOUNTER — Emergency Department (HOSPITAL_COMMUNITY)
Admission: EM | Admit: 2021-03-19 | Discharge: 2021-03-20 | Disposition: A | Discharge status: Home / Self Care | Payer: PPO | Source: Home / Self Care | Attending: Emergency Medicine | Admitting: Emergency Medicine

## 2021-03-19 ENCOUNTER — Ambulatory Visit
Admission: EM | Admit: 2021-03-19 | Discharge: 2021-03-19 | Disposition: A | Discharge status: Home / Self Care | Payer: PPO | Attending: Internal Medicine | Admitting: Internal Medicine

## 2021-03-19 ENCOUNTER — Other Ambulatory Visit: Payer: Self-pay

## 2021-03-19 ENCOUNTER — Encounter: Payer: Self-pay | Admitting: Emergency Medicine

## 2021-03-19 DIAGNOSIS — R059 Cough, unspecified: Secondary | ICD-10-CM | POA: Insufficient documentation

## 2021-03-19 DIAGNOSIS — Z5321 Procedure and treatment not carried out due to patient leaving prior to being seen by health care provider: Secondary | ICD-10-CM | POA: Insufficient documentation

## 2021-03-19 DIAGNOSIS — R0602 Shortness of breath: Secondary | ICD-10-CM

## 2021-03-19 DIAGNOSIS — R0902 Hypoxemia: Secondary | ICD-10-CM | POA: Insufficient documentation

## 2021-03-19 DIAGNOSIS — J449 Chronic obstructive pulmonary disease, unspecified: Secondary | ICD-10-CM | POA: Insufficient documentation

## 2021-03-19 DIAGNOSIS — R509 Fever, unspecified: Secondary | ICD-10-CM | POA: Insufficient documentation

## 2021-03-19 DIAGNOSIS — Z20822 Contact with and (suspected) exposure to covid-19: Secondary | ICD-10-CM | POA: Insufficient documentation

## 2021-03-19 DIAGNOSIS — J811 Chronic pulmonary edema: Secondary | ICD-10-CM | POA: Diagnosis not present

## 2021-03-19 DIAGNOSIS — J9811 Atelectasis: Secondary | ICD-10-CM | POA: Diagnosis not present

## 2021-03-19 DIAGNOSIS — R5383 Other fatigue: Secondary | ICD-10-CM | POA: Diagnosis not present

## 2021-03-19 DIAGNOSIS — Z7951 Long term (current) use of inhaled steroids: Secondary | ICD-10-CM | POA: Insufficient documentation

## 2021-03-19 HISTORY — DX: Chronic obstructive pulmonary disease, unspecified: J44.9

## 2021-03-19 LAB — RESP PANEL BY RT-PCR (FLU A&B, COVID) ARPGX2
Influenza A by PCR: NEGATIVE
Influenza B by PCR: NEGATIVE
SARS Coronavirus 2 by RT PCR: NEGATIVE

## 2021-03-19 LAB — CBC WITH DIFFERENTIAL/PLATELET
Abs Immature Granulocytes: 0.34 10*3/uL — ABNORMAL HIGH (ref 0.00–0.07)
Basophils Absolute: 0 10*3/uL (ref 0.0–0.1)
Basophils Relative: 0 %
Eosinophils Absolute: 0 10*3/uL (ref 0.0–0.5)
Eosinophils Relative: 0 %
HCT: 34.6 % — ABNORMAL LOW (ref 36.0–46.0)
Hemoglobin: 12 g/dL (ref 12.0–15.0)
Immature Granulocytes: 2 %
Lymphocytes Relative: 4 %
Lymphs Abs: 0.8 10*3/uL (ref 0.7–4.0)
MCH: 32.9 pg (ref 26.0–34.0)
MCHC: 34.7 g/dL (ref 30.0–36.0)
MCV: 94.8 fL (ref 80.0–100.0)
Monocytes Absolute: 1.4 10*3/uL — ABNORMAL HIGH (ref 0.1–1.0)
Monocytes Relative: 7 %
Neutro Abs: 18.4 10*3/uL — ABNORMAL HIGH (ref 1.7–7.7)
Neutrophils Relative %: 87 %
Platelets: 159 10*3/uL (ref 150–400)
RBC: 3.65 MIL/uL — ABNORMAL LOW (ref 3.87–5.11)
RDW: 13.4 % (ref 11.5–15.5)
WBC: 20.9 10*3/uL — ABNORMAL HIGH (ref 4.0–10.5)
nRBC: 0 % (ref 0.0–0.2)

## 2021-03-19 LAB — BASIC METABOLIC PANEL
Anion gap: 8 (ref 5–15)
BUN: 14 mg/dL (ref 8–23)
CO2: 23 mmol/L (ref 22–32)
Calcium: 8.8 mg/dL — ABNORMAL LOW (ref 8.9–10.3)
Chloride: 90 mmol/L — ABNORMAL LOW (ref 98–111)
Creatinine, Ser: 1.38 mg/dL — ABNORMAL HIGH (ref 0.44–1.00)
GFR, Estimated: 41 mL/min — ABNORMAL LOW (ref >60)
Glucose, Bld: 101 mg/dL — ABNORMAL HIGH (ref 70–99)
Potassium: 4.8 mmol/L (ref 3.5–5.1)
Sodium: 121 mmol/L — ABNORMAL LOW (ref 135–145)

## 2021-03-19 LAB — BRAIN NATRIURETIC PEPTIDE: B Natriuretic Peptide: 1149.3 pg/mL — ABNORMAL HIGH (ref 0.0–100.0)

## 2021-03-19 NOTE — Discharge Instructions (Signed)
Please go to the emergency department as soon as you leave urgent care for further evaluation and management. ?

## 2021-03-19 NOTE — ED Triage Notes (Signed)
Patient complains of 3 days of increased SOB and cough. States she has COPD and sent from [facility] for further evaluation. Alert and oriented

## 2021-03-19 NOTE — ED Notes (Signed)
Registration informed us this patient left awhile ago

## 2021-03-19 NOTE — ED Triage Notes (Addendum)
Patient presents tachypnic, reports new shortness of breath and intermittent dizziness starting Saturday. Woke up this morning with new left sided abdominal pain and constipation, says she feels shaky and has recently lost her appetite. Says the reason she came today is that "I just don't feel right." Hx of COPD, lupus, pacemaker, neuropathy. Denies hx of diabetes.

## 2021-03-19 NOTE — ED Provider Notes (Signed)
[facility]    CSN: [id number] Arrival date & time: 03/19/21  6967      History   Chief Complaint No chief complaint on file.   HPI Sheila Morales is a 69 y.o. female.   Patient presents with shortness of breath, intermittent dizziness, fatigue, constipation, lower abdominal pain that started approximately 2 to 3 days ago upon awakening.  Patient also reports that she "feels shaky" and has had a decreased appetite.  Patient reports that she "just does not feel right".  Patient does have pertinent medical history of COPD, lupus, pacemaker in place.  Patient has also had a cough and a low-grade fever of 99 but denies any upper respiratory symptoms.  Patient reports that she does have shortness of breath at baseline but that the shortness of breath is "worse than normal".  Patient does not wear oxygen at home but does use a rescue inhaler as well as Anoro.  Patient has not had to use her rescue inhaler more often than normal since symptoms started.  Abdominal pain is left lower abdomen.  Last bowel movement was yesterday.  Patient denies any blood in stool.  Patient denies vomiting but has had some occasional nausea and "dry heaves".  Patient denies any known sick contacts.  Denies chest pain.  Patient has her pacemaker checked approximately 1 month ago and reports that there were no abnormalities per physician.    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     Patient Active Problem List   Diagnosis Date Noted   Pacemaker 09/07/2020   Left foot  pain 06/30/2020   Sinus node dysfunction ([place]) 05/26/2020   Bradycardia 05/11/2020   Pain in right lower leg 02/18/2020   Episodic lightheadedness 02/02/2020   Hypertension 02/02/2020   LLQ pain 11/23/2019   Cyst of buttocks 10/05/2019   Methotrexate, long term, current use 07/29/2019   BPPV (benign paroxysmal positional vertigo) 12/16/2017   Insomnia 02/24/2017   COPD (chronic obstructive pulmonary disease) ([place]) 01/22/2017   Routine general medical examination at a health care facility 02/19/2016   Hereditary and idiopathic peripheral neuropathy 02/17/2015   Smokers' cough ([place]) 12/07/2014   Systemic lupus erythematosus ([place]) 10/13/2013   Cough 01/18/2009   Tobacco use 01/02/2009   Rash 05/31/2008   Irritable bowel syndrome 03/25/2008   RHEUMATIC FEVER, HX OF 01/17/2007    Past Surgical History:  Procedure Laterality Date   CHOLECYSTECTOMY     CHOLECYSTECTOMY, LAPAROSCOPIC     COLONOSCOPY     ESOPHAGOGASTRODUODENOSCOPY     lesion, vulva excision     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   Procedure: PACEMAKER IMPLANT;  Surgeon: [name], MD;  Location: [place] [facility];  Service: Cardiovascular;  Laterality: N/A;   TONSILLECTOMY     VIDEO BRONCHOSCOPY Bilateral 11/06/2017   Procedure: VIDEO BRONCHOSCOPY WITHOUT FLUORO;  Surgeon: Marshell Garfinkel, MD;  Location: [facility];  Service: Cardiopulmonary;  Laterality: Bilateral;    OB History   No obstetric  history on file.      Home Medications    Prior to Admission medications   Medication Sig Start Date End Date Taking? Authorizing Provider  albuterol (VENTOLIN HFA) 108 (90 Base) MCG/ACT inhaler Inhale 1-2 puffs into the lungs every 6 (six) hours as needed for wheezing or shortness of breath. 10/13/20   Byrum, Rose Fillers, MD  ANORO ELLIPTA 62.5-25 MCG/ACT AEPB INHALE 1 PUFF INTO THE LUNGS DAILY. 02/26/21   Collene Gobble, MD  aspirin 81 MG tablet Take 81 mg by mouth daily.    [provider]  BLACK COHOSH PO  Take 1 capsule by mouth daily.    [provider]  Calcium Carb-Cholecalciferol (CALCIUM 600 + D PO) Take 1 tablet by mouth daily.    [provider]  calcium carbonate (OS-CAL - DOSED IN MG OF ELEMENTAL CALCIUM) 1250 (500 Ca) MG tablet Take 1 tablet by mouth daily as needed (heartburn).    [provider]  Cholecalciferol (VITAMIN D) 50 MCG (2000 UT) tablet Take 2,000 Units by mouth daily.    [provider]  dicyclomine (BENTYL) 10 MG capsule Take 1 capsule (10 mg total) by mouth 4 (four) times daily -  before meals and at bedtime. 09/27/19   Hoyt Koch, MD  folic acid (FOLVITE) 1 MG tablet Take 1 mg by mouth daily.    [provider]  gabapentin (NEURONTIN) 300 MG capsule TAKE TWO CAPSULES BY MOUTH EVERY MORNING, TWO CAPSULES EACH AFTERNOON AND THREE CAPSULES AT BEDTIME 02/27/21   Hoyt Koch, MD  hydroxychloroquine (PLAQUENIL) 200 MG tablet Take 200 mg by mouth 2 (two) times daily.    [provider]  ibuprofen (ADVIL) 200 MG tablet Take 400 mg by mouth every 6 (six) hours as needed for headache or mild pain.    [provider]  methotrexate 2.5 MG tablet Take 10 mg by mouth every Thursday.    [provider]  Misc Natural Products (ADVANCED JOINT RELIEF) CAPS Take 1 capsule by mouth daily. Joint Advantage Gold 5x    [provider]  Multiple Vitamin (MULTIVITAMIN WITH MINERALS) TABS tablet Take 1 tablet by mouth daily.    [provider]  Omega-3 Fatty Acids (OMEGA 3 PO) Take 1 capsule by mouth daily.    [provider]  OVER THE COUNTER MEDICATION Take 1 capsule by mouth daily. brain essentials otc supplement    [provider]  Polyethyl Glycol-Propyl Glycol (SYSTANE OP) Place 1 drop into both eyes 2 (two) times daily.    [provider]  RESTASIS 0.05 % ophthalmic emulsion Place 1 drop into both eyes 2 (two) times daily. 07/30/17   [provider]   saccharomyces boulardii (FLORASTOR) 250 MG capsule Take 250 mg by mouth 2 (two) times daily.    [provider]  telmisartan (MICARDIS) 40 MG tablet TAKE TWO TABLETS BY MOUTH ONCE DAILY 06/26/20   Binnie Rail, MD  vitamin C (ASCORBIC ACID) 500 MG tablet Take 500 mg by mouth daily.    [provider]  vitamin E 180 MG (400 UNITS) capsule Take 400 Units by mouth daily.    [provider]  zolpidem (AMBIEN) 5 MG tablet Take 1 tablet (5 mg total) by mouth at bedtime as needed for sleep. 10/30/20   Hoyt Koch, MD    Family History Family History  Problem Relation Age of Onset   Stroke Mother        Deceased, 39s  Atrial fibrillation Father    Colon cancer Father    Other Sister        Pick's disease, deceased   Healthy Daughter     Social History Social History   Tobacco Use   Smoking status: Former    Packs/day: 0.25    Years: 30.00    Pack years: 7.50    Types: Cigarettes    Quit date: 01/21/2020    Years since quitting: 1.1   Smokeless tobacco: Never   Tobacco comments:    smokes "every now and then" 08/24/20 [facility]   Vaping Use   Vaping Use: Never used  Substance Use Topics   Alcohol use: Yes    Alcohol/week: 5.0 standard drinks    Types: 5 Standard drinks or equivalent per week    Comment: 2 glasses of wine nightly, 4 beers nightly on weekend   Drug use: No     Allergies   Patient has no known allergies.   Review of Systems Review of Systems Per HPI  Physical Exam Triage Vital Signs ED Triage Vitals [03/19/21 0942]  Enc Vitals Group     BP 98/60     Pulse Rate 67     Resp      Temp 98.6 F (37 C)     Temp Source Oral     SpO2 95 %     Weight      Height      Head Circumference      Peak Flow      Pain Score      Pain Loc      Pain Edu?      Excl. in [place]?    No data found.  Updated Vital Signs BP 98/60 (BP Location: Left Arm)   Pulse 67   Temp 98.6 F (37 C) (Oral)   SpO2 95%   Visual Acuity Right Eye  Distance:   Left Eye Distance:   Bilateral Distance:    Right Eye Near:   Left Eye Near:    Bilateral Near:     Physical Exam Constitutional:      General: She is not in acute distress.    Appearance: Normal appearance. She is ill-appearing and toxic-appearing. She is not diaphoretic.  HENT:     Head: Normocephalic and atraumatic.     Right Ear: Tympanic membrane and ear canal normal.     Left Ear: Ear canal normal.     Nose: Nose normal.     Mouth/Throat:     Mouth: Mucous membranes are moist.     Pharynx: No posterior oropharyngeal erythema.  Eyes:     Extraocular Movements: Extraocular movements intact.     Conjunctiva/sclera: Conjunctivae normal.     Pupils: Pupils are equal, round, and reactive to light.  Cardiovascular:     Rate and Rhythm: Normal rate and regular rhythm.     Pulses: Normal pulses.     Heart sounds: Normal heart sounds.  Pulmonary:     Effort: No respiratory distress.     Breath sounds: Normal breath sounds.     Comments: Tachypnea present. Abdominal:     General: Bowel sounds are normal. There is no distension.     Palpations: Abdomen is soft.     Tenderness: There is no abdominal tenderness.  Skin:    General: Skin is warm and dry.  Neurological:     General: No focal deficit present.     Mental Status: She is alert and oriented to  person, place, and time. Mental status is at baseline.  Psychiatric:        Mood and Affect: Mood normal.        Behavior: Behavior normal.        Thought Content: Thought content normal.        Judgment: Judgment normal.     UC Treatments / Results  Labs (all labs ordered are listed, but only abnormal results are displayed) Labs Reviewed - No data to display  EKG   Radiology No results found.  Procedures Procedures (including critical care time)  Medications Ordered in UC Medications - No data to display  Initial Impression / Assessment and Plan / UC Course  I have reviewed the triage vital signs  and the nursing notes.  Pertinent labs & imaging results that were available during my care of the patient were reviewed by me and considered in my medical decision making (see chart for details).     EKG showing atrial pacing with nonspecific ST wave abnormality.  Patient has tachypnea at rest during physical exam.  Suggested to patient that she will need further evaluation and management at the hospital given EKG, current symptoms, and tachypnea on exam. Suggested EMS transport but the patient declined.  Patient stated that she would take her self to the hospital.  Risks associated with not going to the hospital via EMS were discussed with patient.  Patient voiced understanding and left to go to the hospital via self transport. Final Clinical Impressions(s) / UC Diagnoses   Final diagnoses:  Shortness of breath  Other fatigue     Discharge Instructions      Please go to the emergency department as soon as you leave urgent care for further evaluation and management.    ED Prescriptions   None    PDMP not reviewed this encounter.   Teodora Medici, [place] 03/19/21 1112

## 2021-03-19 NOTE — ED Provider Notes (Signed)
Emergency Medicine Provider Triage Evaluation Note  Sheila Morales , a 70 y.o. female  was evaluated in triage.  Pt complains of shortness of breath.  She has been feeling short of breath the last 2 days, using her COPD medicine as prescribed.  Feels she is needing her rescue inhaler more than normal, symptoms are associated with cough and fever at home.  No chest pain.  Went to urgent care was sent to ED due to tachypnea at rest.  She is normally not on oxygen at baseline, she was found to be hypoxic then put on 2 L of oxygen.  Review of Systems  Positive: Shortness of breath, cough, fever Negative: Chest pain  Physical Exam  BP (!) 115/52 (BP Location: Right Arm)   Pulse 65   Temp 98.4 F (36.9 C) (Oral)   Resp 18   SpO2 100%  Gen:   Awake, ill-appearing but not in acute distress Resp:  Normal effort.  Blunted right-sided air movement.  No expiratory wheezing MSK:   Moves extremities without difficulty  Other:  S1-S2  Medical Decision Making  Medically screening exam initiated at 12:20 PM.  Appropriate orders placed.  MYKEL MOHL was informed that the remainder of the evaluation will be completed by another provider, this initial triage assessment does not replace that evaluation, and the importance of remaining in the ED until their evaluation is complete.  Shortness of breath work-up   Sherrill Raring, Hershal Coria 03/19/21 1221    Regan Lemming, MD 03/19/21 [phone]

## 2021-03-20 ENCOUNTER — Telehealth: Payer: Self-pay | Admitting: Internal Medicine

## 2021-03-20 ENCOUNTER — Telehealth: Payer: Self-pay | Admitting: Emergency Medicine

## 2021-03-20 MED ORDER — BENZONATATE 200 MG PO CAPS: 200.0000 mg | ORAL_CAPSULE | Freq: Four times a day (QID) | ORAL | 1 refills | Status: DC | PRN

## 2021-03-20 NOTE — Telephone Encounter (Signed)
It sounds like she needs an acute OV w APP here, including walking oximetry or 6-min walk. Can we get this set up?  In meantime ok to give tessalon 200mg  q6h prn cough

## 2021-03-20 NOTE — Telephone Encounter (Signed)
I called the patient and she verified DOB and she is agreeable to the cough tablets that Dr. Lamonte Sakai recommendation and they have been sent to the pharmacy per patient request.   I have also made an OV with the NP per Dr. Lamonte Sakai recommendations. Nothing further needed.

## 2021-03-20 NOTE — Telephone Encounter (Signed)
See below

## 2021-03-20 NOTE — Telephone Encounter (Signed)
Spoke with the patient and she has been scheduled for 12.5.2022 at 1:20 pm. She also mentioned that she was going to try and get in with her pulmonologist sometime this week.

## 2021-03-20 NOTE — Telephone Encounter (Signed)
Patient calling in again  Wants to know about her results but also says she is having trouble breathing w/ shortness of breath... patient spoke w/ [facility] earlier  Please call patient [phone]

## 2021-03-20 NOTE — Telephone Encounter (Signed)
Patient states she went to the ed on 11-28, patient states due to the wait time she left before receiving Covid and labs results  Patient is requesting a call back to discuss test results

## 2021-03-20 NOTE — Telephone Encounter (Signed)
If she is wanting assessment of symptoms needs visit. Can discuss results then. It is difficult to interpret results without knowing clinical history as well.

## 2021-03-20 NOTE — Telephone Encounter (Signed)
Call made to patient, confirmed DOB. Patient reports increased SOB that started Friday. She went to urgent care Saturday. They sent her to [facility]. She sat there for 10 hours and then they she left and went home due to the long sitting hours. She does not currently use oxygen however she reports the most recent oxygen reading is 96%. Reports a dry cough. Denies chest pain or discomfort. Denies fever, body aches, chills, or sweats. Reports SOB with minimal activity. She states she was placed on oxygen while at hospital but they did not send her home on it. She confirms she is using Anoro daily and albuterol as needed. She has not tried any OTC for cough. Requesting recommendations.   RB please advise. Patient aware if she needs to be checked for oxgen she will need to come into office.

## 2021-03-21 ENCOUNTER — Encounter (HOSPITAL_BASED_OUTPATIENT_CLINIC_OR_DEPARTMENT_OTHER): Payer: Self-pay

## 2021-03-21 ENCOUNTER — Emergency Department (HOSPITAL_BASED_OUTPATIENT_CLINIC_OR_DEPARTMENT_OTHER): Payer: PPO

## 2021-03-21 ENCOUNTER — Telehealth: Payer: Self-pay | Admitting: Primary Care

## 2021-03-21 ENCOUNTER — Inpatient Hospital Stay (HOSPITAL_BASED_OUTPATIENT_CLINIC_OR_DEPARTMENT_OTHER)
Admission: EM | Admit: 2021-03-21 | Discharge: 2021-04-01 | DRG: 871 | Disposition: A | Discharge status: Home Health | Payer: PPO | Attending: Family Medicine | Admitting: Family Medicine

## 2021-03-21 ENCOUNTER — Ambulatory Visit: Payer: PPO | Admitting: Primary Care

## 2021-03-21 ENCOUNTER — Other Ambulatory Visit: Payer: Self-pay

## 2021-03-21 ENCOUNTER — Encounter: Payer: Self-pay | Admitting: Internal Medicine

## 2021-03-21 ENCOUNTER — Encounter: Payer: Self-pay | Admitting: Primary Care

## 2021-03-21 VITALS — BP 112/68 | HR 73 | Temp 98.8°F | Ht 63.0 in | Wt 137.8 lb

## 2021-03-21 DIAGNOSIS — I700 Atherosclerosis of aorta: Secondary | ICD-10-CM | POA: Diagnosis present

## 2021-03-21 DIAGNOSIS — Z79899 Other long term (current) drug therapy: Secondary | ICD-10-CM

## 2021-03-21 DIAGNOSIS — J9601 Acute respiratory failure with hypoxia: Secondary | ICD-10-CM

## 2021-03-21 DIAGNOSIS — D72829 Elevated white blood cell count, unspecified: Secondary | ICD-10-CM

## 2021-03-21 DIAGNOSIS — R0602 Shortness of breath: Secondary | ICD-10-CM | POA: Diagnosis not present

## 2021-03-21 DIAGNOSIS — I5032 Chronic diastolic (congestive) heart failure: Secondary | ICD-10-CM | POA: Insufficient documentation

## 2021-03-21 DIAGNOSIS — I480 Paroxysmal atrial fibrillation: Secondary | ICD-10-CM

## 2021-03-21 DIAGNOSIS — I5033 Acute on chronic diastolic (congestive) heart failure: Secondary | ICD-10-CM | POA: Diagnosis present

## 2021-03-21 DIAGNOSIS — J439 Emphysema, unspecified: Secondary | ICD-10-CM | POA: Diagnosis not present

## 2021-03-21 DIAGNOSIS — E872 Acidosis, unspecified: Secondary | ICD-10-CM | POA: Diagnosis present

## 2021-03-21 DIAGNOSIS — R0603 Acute respiratory distress: Secondary | ICD-10-CM | POA: Diagnosis not present

## 2021-03-21 DIAGNOSIS — Z7982 Long term (current) use of aspirin: Secondary | ICD-10-CM

## 2021-03-21 DIAGNOSIS — J441 Chronic obstructive pulmonary disease with (acute) exacerbation: Secondary | ICD-10-CM

## 2021-03-21 DIAGNOSIS — Z7901 Long term (current) use of anticoagulants: Secondary | ICD-10-CM

## 2021-03-21 DIAGNOSIS — I5023 Acute on chronic systolic (congestive) heart failure: Secondary | ICD-10-CM | POA: Diagnosis not present

## 2021-03-21 DIAGNOSIS — Z823 Family history of stroke: Secondary | ICD-10-CM

## 2021-03-21 DIAGNOSIS — R7989 Other specified abnormal findings of blood chemistry: Secondary | ICD-10-CM | POA: Diagnosis present

## 2021-03-21 DIAGNOSIS — N12 Tubulo-interstitial nephritis, not specified as acute or chronic: Secondary | ICD-10-CM | POA: Diagnosis present

## 2021-03-21 DIAGNOSIS — K589 Irritable bowel syndrome without diarrhea: Secondary | ICD-10-CM | POA: Diagnosis present

## 2021-03-21 DIAGNOSIS — D849 Immunodeficiency, unspecified: Secondary | ICD-10-CM | POA: Diagnosis present

## 2021-03-21 DIAGNOSIS — I4891 Unspecified atrial fibrillation: Secondary | ICD-10-CM

## 2021-03-21 DIAGNOSIS — I83813 Varicose veins of bilateral lower extremities with pain: Secondary | ICD-10-CM | POA: Diagnosis present

## 2021-03-21 DIAGNOSIS — Z720 Tobacco use: Secondary | ICD-10-CM | POA: Diagnosis present

## 2021-03-21 DIAGNOSIS — M329 Systemic lupus erythematosus, unspecified: Secondary | ICD-10-CM | POA: Diagnosis present

## 2021-03-21 DIAGNOSIS — B9620 Unspecified Escherichia coli [E. coli] as the cause of diseases classified elsewhere: Secondary | ICD-10-CM

## 2021-03-21 DIAGNOSIS — G9341 Metabolic encephalopathy: Secondary | ICD-10-CM | POA: Diagnosis present

## 2021-03-21 DIAGNOSIS — G2581 Restless legs syndrome: Secondary | ICD-10-CM | POA: Diagnosis present

## 2021-03-21 DIAGNOSIS — I110 Hypertensive heart disease with heart failure: Secondary | ICD-10-CM | POA: Diagnosis present

## 2021-03-21 DIAGNOSIS — N9489 Other specified conditions associated with female genital organs and menstrual cycle: Secondary | ICD-10-CM

## 2021-03-21 DIAGNOSIS — J9602 Acute respiratory failure with hypercapnia: Secondary | ICD-10-CM | POA: Diagnosis present

## 2021-03-21 DIAGNOSIS — N179 Acute kidney failure, unspecified: Secondary | ICD-10-CM

## 2021-03-21 DIAGNOSIS — R0989 Other specified symptoms and signs involving the circulatory and respiratory systems: Secondary | ICD-10-CM | POA: Diagnosis present

## 2021-03-21 DIAGNOSIS — G629 Polyneuropathy, unspecified: Secondary | ICD-10-CM | POA: Diagnosis present

## 2021-03-21 DIAGNOSIS — Z8619 Personal history of other infectious and parasitic diseases: Secondary | ICD-10-CM

## 2021-03-21 DIAGNOSIS — E871 Hypo-osmolality and hyponatremia: Secondary | ICD-10-CM

## 2021-03-21 DIAGNOSIS — J189 Pneumonia, unspecified organism: Secondary | ICD-10-CM

## 2021-03-21 DIAGNOSIS — Z9049 Acquired absence of other specified parts of digestive tract: Secondary | ICD-10-CM

## 2021-03-21 DIAGNOSIS — D6489 Other specified anemias: Secondary | ICD-10-CM | POA: Diagnosis present

## 2021-03-21 DIAGNOSIS — N170 Acute kidney failure with tubular necrosis: Secondary | ICD-10-CM | POA: Diagnosis present

## 2021-03-21 DIAGNOSIS — R059 Cough, unspecified: Secondary | ICD-10-CM | POA: Diagnosis not present

## 2021-03-21 DIAGNOSIS — R4182 Altered mental status, unspecified: Secondary | ICD-10-CM | POA: Diagnosis not present

## 2021-03-21 DIAGNOSIS — Z950 Presence of cardiac pacemaker: Secondary | ICD-10-CM | POA: Diagnosis present

## 2021-03-21 DIAGNOSIS — E222 Syndrome of inappropriate secretion of antidiuretic hormone: Secondary | ICD-10-CM | POA: Diagnosis present

## 2021-03-21 DIAGNOSIS — M1990 Unspecified osteoarthritis, unspecified site: Secondary | ICD-10-CM | POA: Diagnosis present

## 2021-03-21 DIAGNOSIS — Z8679 Personal history of other diseases of the circulatory system: Secondary | ICD-10-CM

## 2021-03-21 DIAGNOSIS — I495 Sick sinus syndrome: Secondary | ICD-10-CM | POA: Diagnosis present

## 2021-03-21 DIAGNOSIS — E785 Hyperlipidemia, unspecified: Secondary | ICD-10-CM | POA: Diagnosis present

## 2021-03-21 DIAGNOSIS — J449 Chronic obstructive pulmonary disease, unspecified: Secondary | ICD-10-CM | POA: Diagnosis present

## 2021-03-21 DIAGNOSIS — Z20822 Contact with and (suspected) exposure to covid-19: Secondary | ICD-10-CM | POA: Diagnosis present

## 2021-03-21 DIAGNOSIS — I509 Heart failure, unspecified: Principal | ICD-10-CM

## 2021-03-21 DIAGNOSIS — Z28310 Unvaccinated for covid-19: Secondary | ICD-10-CM

## 2021-03-21 DIAGNOSIS — R7881 Bacteremia: Secondary | ICD-10-CM

## 2021-03-21 DIAGNOSIS — G4700 Insomnia, unspecified: Secondary | ICD-10-CM | POA: Diagnosis present

## 2021-03-21 DIAGNOSIS — I4819 Other persistent atrial fibrillation: Secondary | ICD-10-CM | POA: Diagnosis present

## 2021-03-21 DIAGNOSIS — Z79818 Long term (current) use of other agents affecting estrogen receptors and estrogen levels: Secondary | ICD-10-CM

## 2021-03-21 DIAGNOSIS — F17210 Nicotine dependence, cigarettes, uncomplicated: Secondary | ICD-10-CM | POA: Diagnosis present

## 2021-03-21 DIAGNOSIS — J9811 Atelectasis: Secondary | ICD-10-CM | POA: Diagnosis not present

## 2021-03-21 DIAGNOSIS — I10 Essential (primary) hypertension: Secondary | ICD-10-CM | POA: Diagnosis present

## 2021-03-21 DIAGNOSIS — E876 Hypokalemia: Secondary | ICD-10-CM | POA: Diagnosis present

## 2021-03-21 DIAGNOSIS — A4151 Sepsis due to Escherichia coli [E. coli]: Principal | ICD-10-CM | POA: Diagnosis present

## 2021-03-21 LAB — CBC WITH DIFFERENTIAL/PLATELET
Abs Immature Granulocytes: 0.17 10*3/uL — ABNORMAL HIGH (ref 0.00–0.07)
Basophils Absolute: 0.1 10*3/uL (ref 0.0–0.1)
Basophils Relative: 0 %
Eosinophils Absolute: 0.2 10*3/uL (ref 0.0–0.5)
Eosinophils Relative: 1 %
HCT: 33.8 % — ABNORMAL LOW (ref 36.0–46.0)
Hemoglobin: 12.1 g/dL (ref 12.0–15.0)
Immature Granulocytes: 1 %
Lymphocytes Relative: 1 %
Lymphs Abs: 0.4 10*3/uL — ABNORMAL LOW (ref 0.7–4.0)
MCH: 32.4 pg (ref 26.0–34.0)
MCHC: 35.8 g/dL (ref 30.0–36.0)
MCV: 90.6 fL (ref 80.0–100.0)
Monocytes Absolute: 1.9 10*3/uL — ABNORMAL HIGH (ref 0.1–1.0)
Monocytes Relative: 8 %
Neutro Abs: 21.9 10*3/uL — ABNORMAL HIGH (ref 1.7–7.7)
Neutrophils Relative %: 89 %
Platelets: 152 10*3/uL (ref 150–400)
RBC: 3.73 MIL/uL — ABNORMAL LOW (ref 3.87–5.11)
RDW: 13.5 % (ref 11.5–15.5)
WBC: 24.8 10*3/uL — ABNORMAL HIGH (ref 4.0–10.5)
nRBC: 0 % (ref 0.0–0.2)

## 2021-03-21 LAB — COMPREHENSIVE METABOLIC PANEL
ALT: 26 U/L (ref 0–44)
AST: 42 U/L — ABNORMAL HIGH (ref 15–41)
Albumin: 3.6 g/dL (ref 3.5–5.0)
Alkaline Phosphatase: 115 U/L (ref 38–126)
Anion gap: 13 (ref 5–15)
BUN: 29 mg/dL — ABNORMAL HIGH (ref 8–23)
CO2: 20 mmol/L — ABNORMAL LOW (ref 22–32)
Calcium: 9 mg/dL (ref 8.9–10.3)
Chloride: 83 mmol/L — ABNORMAL LOW (ref 98–111)
Creatinine, Ser: 1.86 mg/dL — ABNORMAL HIGH (ref 0.44–1.00)
GFR, Estimated: 29 mL/min — ABNORMAL LOW (ref >60)
Glucose, Bld: 80 mg/dL (ref 70–99)
Potassium: 4.8 mmol/L (ref 3.5–5.1)
Sodium: 116 mmol/L — CL (ref 135–145)
Total Bilirubin: 0.9 mg/dL (ref 0.3–1.2)
Total Protein: 7.3 g/dL (ref 6.5–8.1)

## 2021-03-21 LAB — I-STAT VENOUS BLOOD GAS, ED
Acid-base deficit: 3 mmol/L — ABNORMAL HIGH (ref 0.0–2.0)
Bicarbonate: 21.9 mmol/L (ref 20.0–28.0)
Calcium, Ion: 1.17 mmol/L (ref 1.15–1.40)
HCT: 32 % — ABNORMAL LOW (ref 36.0–46.0)
Hemoglobin: 10.9 g/dL — ABNORMAL LOW (ref 12.0–15.0)
O2 Saturation: 69 %
Patient temperature: 100
Potassium: 4.9 mmol/L (ref 3.5–5.1)
Sodium: 117 mmol/L — CL (ref 135–145)
TCO2: 23 mmol/L (ref 22–32)
pCO2, Ven: 40.2 mmHg — ABNORMAL LOW (ref 44.0–60.0)
pH, Ven: 7.347 (ref 7.250–7.430)
pO2, Ven: 39 mmHg (ref 32.0–45.0)

## 2021-03-21 LAB — CBG MONITORING, ED: Glucose-Capillary: 82 mg/dL (ref 70–99)

## 2021-03-21 LAB — RESP PANEL BY RT-PCR (FLU A&B, COVID) ARPGX2
Influenza A by PCR: NEGATIVE
Influenza B by PCR: NEGATIVE
SARS Coronavirus 2 by RT PCR: NEGATIVE

## 2021-03-21 LAB — BASIC METABOLIC PANEL
BUN: 29 mg/dL — ABNORMAL HIGH (ref 6–23)
CO2: 23 mEq/L (ref 19–32)
Calcium: 9 mg/dL (ref 8.4–10.5)
Chloride: 84 mEq/L — ABNORMAL LOW (ref 96–112)
Creatinine, Ser: 1.9 mg/dL — ABNORMAL HIGH (ref 0.40–1.20)
GFR: 26.26 mL/min — ABNORMAL LOW (ref >60.00)
Glucose, Bld: 77 mg/dL (ref 70–99)
Potassium: 5.3 mEq/L — ABNORMAL HIGH (ref 3.5–5.1)
Sodium: 113 mEq/L — CL (ref 135–145)

## 2021-03-21 LAB — PROTIME-INR
INR: 1.1 (ref 0.8–1.2)
Prothrombin Time: 14.3 seconds (ref 11.4–15.2)

## 2021-03-21 LAB — AMMONIA: Ammonia: 11 umol/L (ref 9–35)

## 2021-03-21 LAB — LACTIC ACID, PLASMA
Lactic Acid, Venous: 1.3 mmol/L (ref 0.5–1.9)
Lactic Acid, Venous: 0.9 mmol/L (ref 0.5–1.9)

## 2021-03-21 LAB — BRAIN NATRIURETIC PEPTIDE
B Natriuretic Peptide: 1399.1 pg/mL — ABNORMAL HIGH (ref 0.0–100.0)
Pro B Natriuretic peptide (BNP): 1279 pg/mL — ABNORMAL HIGH (ref 0.0–100.0)

## 2021-03-21 LAB — APTT: aPTT: 31 seconds (ref 24–36)

## 2021-03-21 MED ORDER — SODIUM CHLORIDE 0.9 % IV SOLN
2.0000 g | Freq: Once | INTRAVENOUS | Status: AC
  Administered 2021-03-21: 2 g via INTRAVENOUS
  Filled 2021-03-21: qty 2

## 2021-03-21 MED ORDER — FUROSEMIDE 40 MG PO TABS: 40.0000 mg | ORAL_TABLET | Freq: Every day | ORAL | 0 refills | Status: DC

## 2021-03-21 MED ORDER — SODIUM CHLORIDE 0.9 % IV SOLN: 2.0000 g | INTRAVENOUS | Status: DC

## 2021-03-21 MED ORDER — DOXYCYCLINE HYCLATE 100 MG PO TABS: 100.0000 mg | ORAL_TABLET | Freq: Two times a day (BID) | ORAL | 0 refills | Status: DC

## 2021-03-21 MED ORDER — FUROSEMIDE 10 MG/ML IJ SOLN
40.0000 mg | Freq: Once | INTRAMUSCULAR | Status: AC
  Administered 2021-03-21: 40 mg via INTRAVENOUS
  Filled 2021-03-21: qty 4

## 2021-03-21 MED ORDER — VANCOMYCIN HCL IN DEXTROSE 1-5 GM/200ML-% IV SOLN
1000.0000 mg | Freq: Once | INTRAVENOUS | Status: AC
  Administered 2021-03-21: 1000 mg via INTRAVENOUS
  Filled 2021-03-21: qty 200

## 2021-03-21 MED ORDER — METRONIDAZOLE 500 MG/100ML IV SOLN
500.0000 mg | Freq: Once | INTRAVENOUS | Status: AC
  Administered 2021-03-21: 500 mg via INTRAVENOUS
  Filled 2021-03-21: qty 100

## 2021-03-21 MED ORDER — LORAZEPAM 2 MG/ML IJ SOLN
0.5000 mg | Freq: Once | INTRAMUSCULAR | Status: AC
  Administered 2021-03-21: 0.5 mg via INTRAVENOUS
  Filled 2021-03-21: qty 1

## 2021-03-21 MED ORDER — LIP MEDEX EX OINT: TOPICAL_OINTMENT | Freq: Once | CUTANEOUS | Status: AC

## 2021-03-21 MED ORDER — ACETAMINOPHEN 325 MG PO TABS
650.0000 mg | ORAL_TABLET | Freq: Once | ORAL | Status: AC
  Administered 2021-03-21: 650 mg via ORAL
  Filled 2021-03-21: qty 2

## 2021-03-21 MED ORDER — NICOTINE 14 MG/24HR TD PT24
14.0000 mg | MEDICATED_PATCH | Freq: Once | TRANSDERMAL | Status: DC
  Filled 2021-03-21: qty 1

## 2021-03-21 MED ORDER — VANCOMYCIN VARIABLE DOSE PER UNSTABLE RENAL FUNCTION (PHARMACIST DOSING)
Status: DC
  Filled 2021-03-21: qty 1

## 2021-03-21 MED ORDER — NITROGLYCERIN 0.4 MG SL SUBL
0.4000 mg | SUBLINGUAL_TABLET | SUBLINGUAL | Status: DC | PRN
  Administered 2021-03-21: 0.4 mg via SUBLINGUAL
  Filled 2021-03-21: qty 1

## 2021-03-21 MED ORDER — ONDANSETRON HCL 4 MG/2ML IJ SOLN
4.0000 mg | Freq: Once | INTRAMUSCULAR | Status: AC
  Administered 2021-03-21: 4 mg via INTRAVENOUS
  Filled 2021-03-21: qty 2

## 2021-03-21 MED ORDER — SODIUM CHLORIDE 0.9 % IV SOLN: INTRAVENOUS | Status: DC | PRN

## 2021-03-21 NOTE — Telephone Encounter (Signed)
Spoke to husband (ok per DPR)-aware of appt tomorrow with Dr. [name].

## 2021-03-21 NOTE — Patient Instructions (Addendum)
CXR and BNP showed you are fluid overloaded. We will start you on a diuretic called lasix. Restrict free-water/fluid intake to 1 -1.5L a day. Follow up with cardiology tomorrow and they need to adjust medications

## 2021-03-21 NOTE — ED Provider Notes (Signed)
[facility] EMERGENCY DEPARTMENT Provider Note   CSN: [id number] Arrival date & time: 03/21/21  1833     History Chief Complaint  Patient presents with   Altered Mental Status   LEVEL 5 CAVEAT - [place]  Sheila Morales is a 70 y.o. female with PMHx COPD who presents to the ED today for abnormal blood work/altered mental status.   Husband at bedside providing much of the history - reports that a few days ago she began experiencing more shortness of breath and chest congestion despite using inhalers as indicated. They went to [facility] ED on Monday (2 days ago) however LWBS after prolonged wait time. She was able to have a chest xray done and blood work prior to husband taking her home. Husband followed up on bloodwork yesterday and noticed changes to her chest xray. Wife was able to call her PCP and pulmonologist for follow up appointment and saw her pulmonologist today; they started her on Lasix after BNP elevated with pulmonary vascular congestion on CXR in the ED. They also discharged her with doxycycline after noticing WBC of 20,000 which she had not started yet prior to receiving call for hyponatremia at 112. Husband does report pt seemed more fatigued and somewhat altered yesterday. He denies hx of CHF. She has also had low grade fever however never higher than 99 F.   The history is provided by the patient, the spouse and medical records.      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     Patient Active Problem List   Diagnosis Date Noted   Pulmonary vascular congestion 03/21/2021   Acute CHF (congestive heart failure) ([place]) 03/21/2021   Pacemaker 09/07/2020   Left foot pain 06/30/2020   Sinus node dysfunction ([place]) 05/26/2020   Bradycardia 05/11/2020   Pain in right lower leg 02/18/2020   Episodic lightheadedness 02/02/2020   Hypertension 02/02/2020   LLQ pain 11/23/2019   Cyst of buttocks 10/05/2019   Methotrexate, long term, current use 07/29/2019   BPPV (benign paroxysmal positional vertigo) 12/16/2017   Insomnia 02/24/2017   COPD with acute exacerbation ([place]) 01/22/2017   Routine general medical examination at a health care facility 02/19/2016   Hereditary and idiopathic peripheral neuropathy 02/17/2015   Smokers' cough ([place]) 12/07/2014   Systemic lupus erythematosus ([place]) 10/13/2013   Cough 01/18/2009   Tobacco use 01/02/2009   Rash 05/31/2008   Irritable bowel syndrome 03/25/2008   RHEUMATIC FEVER, HX OF 01/17/2007    Past Surgical History:  Procedure Laterality Date   CHOLECYSTECTOMY     CHOLECYSTECTOMY, LAPAROSCOPIC     COLONOSCOPY     ESOPHAGOGASTRODUODENOSCOPY     lesion, vulva excision     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   Procedure: PACEMAKER IMPLANT;  Surgeon: [name], MD;  Location: [facility];  Service: Cardiovascular;  Laterality: N/A;   TONSILLECTOMY     VIDEO BRONCHOSCOPY Bilateral  11/06/2017   Procedure: VIDEO BRONCHOSCOPY WITHOUT FLUORO;  Surgeon: Marshell Garfinkel, MD;  Location: [facility];  Service: Cardiopulmonary;  Laterality: Bilateral;     OB History   No obstetric history on file.     Family History  Problem Relation Age of Onset   Stroke Mother        Deceased, 42s   Atrial fibrillation Father    Colon cancer Father    Other Sister        Pick's disease, deceased   Healthy Daughter     Social History   Tobacco Use   Smoking status: Every Day    Packs/day: 0.25    Years: 30.00     Pack years: 7.50    Types: Cigarettes    Last attempt to quit: 01/21/2020    Years since quitting: 1.1   Smokeless tobacco: Never   Tobacco comments:    smokes "every now and then" 08/24/20 [facility]   Vaping Use   Vaping Use: Never used  Substance Use Topics   Alcohol use: Yes    Alcohol/week: 5.0 standard drinks    Types: 5 Standard drinks or equivalent per week    Comment: 4 out of 7 dyas per husband at bedside   Drug use: No    Home Medications Prior to Admission medications   Medication Sig Start Date End Date Taking? Authorizing Provider  albuterol (VENTOLIN HFA) 108 (90 Base) MCG/ACT inhaler Inhale 1-2 puffs into the lungs every 6 (six) hours as needed for wheezing or shortness of breath. 10/13/20   Byrum, Rose Fillers, MD  ANORO ELLIPTA 62.5-25 MCG/ACT AEPB INHALE 1 PUFF INTO THE LUNGS DAILY. 02/26/21   Collene Gobble, MD  aspirin 81 MG tablet Take 81 mg by mouth daily.    [provider]  benzonatate (TESSALON) 200 MG capsule Take 1 capsule (200 mg total) by mouth every 6 (six) hours as needed for cough. 03/20/21   Collene Gobble, MD  BLACK COHOSH PO Take 1 capsule by mouth daily.    [provider]  Calcium Carb-Cholecalciferol (CALCIUM 600 + D PO) Take 1 tablet by mouth daily.    [provider]  calcium carbonate (OS-CAL - DOSED IN MG OF ELEMENTAL CALCIUM) 1250 (500 Ca) MG tablet Take 1 tablet by mouth daily as needed (heartburn).    [provider]  Cholecalciferol (VITAMIN D) 50 MCG (2000 UT) tablet Take 2,000 Units by mouth daily.    [provider]  dicyclomine (BENTYL) 10 MG capsule Take 1 capsule (10 mg total) by mouth 4 (four) times daily -  before meals and at bedtime. 09/27/19   Hoyt Koch, MD  doxycycline (VIBRA-TABS) 100 MG tablet Take 1 tablet (100 mg total) by mouth 2 (two) times daily. 03/21/21   Martyn Ehrich, NP  folic acid (FOLVITE) 1 MG tablet Take 1 mg by mouth daily.    [provider]  furosemide  (LASIX) 40 MG tablet Take 1 tablet (40 mg total) by mouth daily. 03/21/21   Martyn Ehrich, NP  gabapentin (NEURONTIN) 300 MG capsule TAKE TWO CAPSULES BY MOUTH EVERY MORNING, TWO CAPSULES EACH AFTERNOON AND THREE CAPSULES AT BEDTIME 02/27/21   Hoyt Koch, MD  hydroxychloroquine (PLAQUENIL) 200 MG tablet Take 200 mg by mouth 2 (two) times daily.    [provider]  ibuprofen (ADVIL) 200 MG tablet Take 400 mg by mouth every 6 (six) hours as needed for headache or mild pain.  [provider]  methotrexate 2.5 MG tablet Take 10 mg by mouth every Thursday.    [provider]  Misc Natural Products (ADVANCED JOINT RELIEF) CAPS Take 1 capsule by mouth daily. Joint Advantage Gold 5x    [provider]  Multiple Vitamin (MULTIVITAMIN WITH MINERALS) TABS tablet Take 1 tablet by mouth daily.    [provider]  Omega-3 Fatty Acids (OMEGA 3 PO) Take 1 capsule by mouth daily.    [provider]  OVER THE COUNTER MEDICATION Take 1 capsule by mouth daily. brain essentials otc supplement    [provider]  Polyethyl Glycol-Propyl Glycol (SYSTANE OP) Place 1 drop into both eyes 2 (two) times daily.    [provider]  RESTASIS 0.05 % ophthalmic emulsion Place 1 drop into both eyes 2 (two) times daily. 07/30/17   [provider]  saccharomyces boulardii (FLORASTOR) 250 MG capsule Take 250 mg by mouth 2 (two) times daily. Patient not taking: Reported on 03/21/2021    [provider]  telmisartan (MICARDIS) 40 MG tablet TAKE TWO TABLETS BY MOUTH ONCE DAILY 06/26/20   Binnie Rail, MD  vitamin C (ASCORBIC ACID) 500 MG tablet Take 500 mg by mouth daily.    [provider]  vitamin E 180 MG (400 UNITS) capsule Take 400 Units by mouth daily.    [provider]  zolpidem (AMBIEN) 5 MG tablet Take 1 tablet (5 mg total) by mouth at bedtime as needed for sleep. 10/30/20   Hoyt Koch, MD     Allergies    Patient has no known allergies.  Review of Systems   Review of Systems  Unable to perform ROS: Mental status change  Constitutional:  Positive for fatigue and fever.  Respiratory:  Positive for cough and shortness of breath.   Psychiatric/Behavioral:  Positive for confusion.    Physical Exam Updated Vital Signs BP (!) 118/55   Pulse 77   Temp (S) 100 F (37.8 C) (Rectal)   Resp 16   Ht 5\' 3"  (1.6 m)   Wt 61.5 kg   SpO2 100%   BMI 24.02 kg/m   Physical Exam Vitals and nursing note reviewed.  Constitutional:      Appearance: She is not ill-appearing or diaphoretic.     Comments: Warm to the touch; rectal temp 100.0 F  HENT:     Head: Normocephalic and atraumatic.  Eyes:     Conjunctiva/sclera: Conjunctivae normal.  Cardiovascular:     Rate and Rhythm: Normal rate and regular rhythm.     Pulses: Normal pulses.  Pulmonary:     Effort: Tachypnea and accessory muscle usage present.     Breath sounds: Decreased air movement present. Rales present. No wheezing.  Abdominal:     Palpations: Abdomen is soft.     Tenderness: There is no abdominal tenderness.  Musculoskeletal:     Cervical back: Neck supple.     Right lower leg: No edema.     Left lower leg: No edema.  Skin:    General: Skin is warm and dry.  Neurological:     Mental Status: She is alert.     Comments: Alert to self and place. Pt believes it is 1963 currently.  Able to follow commands. Moving all extremities equally.     ED Results / Procedures / Treatments   Labs (all labs ordered are listed, but only abnormal results are displayed) Labs Reviewed  COMPREHENSIVE METABOLIC PANEL - Abnormal; Notable for  the following components:      Result Value   Sodium 116 (*)    Chloride 83 (*)    CO2 20 (*)    BUN 29 (*)    Creatinine, Ser 1.86 (*)    AST 42 (*)    GFR, Estimated 29 (*)    All other components within normal limits  CBC WITH DIFFERENTIAL/PLATELET - Abnormal; Notable for the  following components:   WBC 24.8 (*)    RBC 3.73 (*)    HCT 33.8 (*)    Neutro Abs 21.9 (*)    Lymphs Abs 0.4 (*)    Monocytes Absolute 1.9 (*)    Abs Immature Granulocytes 0.17 (*)    All other components within normal limits  BRAIN NATRIURETIC PEPTIDE - Abnormal; Notable for the following components:   B Natriuretic Peptide 1,399.1 (*)    All other components within normal limits  RESP PANEL BY RT-PCR (FLU A&B, COVID) ARPGX2  CULTURE, BLOOD (ROUTINE X 2)  CULTURE, BLOOD (ROUTINE X 2)  URINE CULTURE  LACTIC ACID, PLASMA  PROTIME-INR  APTT  AMMONIA  URINALYSIS, ROUTINE W REFLEX MICROSCOPIC  LACTIC ACID, PLASMA  SODIUM, URINE, RANDOM  BLOOD GAS, VENOUS  CBG MONITORING, ED    EKG None  Radiology DG Chest Port 1 View  Result Date: 03/21/2021 CLINICAL DATA:  Cough and altered mental status. EXAM: PORTABLE CHEST 1 VIEW COMPARISON:  March 19, 2021 FINDINGS: There is a dual lead AICD. Diffuse, chronic appearing increased interstitial lung markings are noted. This is stable in appearance when compared to the prior exam. Mild, stable bibasilar atelectasis is noted. There is no evidence of a pleural effusion or pneumothorax. The heart size and mediastinal contours are within normal limits. The visualized skeletal structures are unremarkable. IMPRESSION: 1. Diffuse, chronic appearing increased interstitial lung markings, stable in appearance when compared to the prior exam. 2. Mild, stable bibasilar atelectasis. Electronically Signed   By: Virgina Norfolk M.D.   On: 03/21/2021 19:27    Procedures .Critical Care Performed by: Eustaquio Maize, PA-C Authorized by: Eustaquio Maize, PA-C   Critical care provider statement:    Critical care time (minutes):  45   Critical care was necessary to treat or prevent imminent or life-threatening deterioration of the following conditions:  Respiratory failure and cardiac failure   Critical care was time spent personally by me on the following  activities:  Development of treatment plan with patient or surrogate, discussions with consultants, evaluation of patient's response to treatment, examination of patient, ordering and review of laboratory studies, ordering and review of radiographic studies, ordering and performing treatments and interventions, pulse oximetry, re-evaluation of patient's condition and review of old charts   Medications Ordered in ED Medications  vancomycin (VANCOCIN) IVPB 1000 mg/200 mL premix (1,000 mg Intravenous New Bag/Given 03/21/21 2055)  nitroGLYCERIN (NITROSTAT) SL tablet 0.4 mg (0.4 mg Sublingual Given 03/21/21 1943)  0.9 %  sodium chloride infusion ( Intravenous New Bag/Given 03/21/21 2003)  lip balm (CARMEX) ointment (has no administration in time range)  nicotine (NICODERM CQ - dosed in mg/24 hours) patch 14 mg (has no administration in time range)  acetaminophen (TYLENOL) tablet 650 mg (650 mg Oral Given 03/21/21 1946)  ceFEPIme (MAXIPIME) 2 g in sodium chloride 0.9 % 100 mL IVPB (0 g Intravenous Paused 03/21/21 2040)  metroNIDAZOLE (FLAGYL) IVPB 500 mg (500 mg Intravenous New Bag/Given 03/21/21 2001)  ondansetron (ZOFRAN) injection 4 mg (4 mg Intravenous Given 03/21/21 1957)  LORazepam (ATIVAN) injection 0.5  mg (0.5 mg Intravenous Given 03/21/21 2004)  furosemide (LASIX) injection 40 mg (40 mg Intravenous Given 03/21/21 2048)  LORazepam (ATIVAN) injection 0.5 mg (0.5 mg Intravenous Given 03/21/21 2045)    ED Course  I have reviewed the triage vital signs and the nursing notes.  Pertinent labs & imaging results that were available during my care of the patient were reviewed by me and considered in my medical decision making (see chart for details).  Clinical Course as of 03/21/21 2105  Wed Mar 21, 2021  2010 WBC(!): 24.8 [MV]  2010 Lactic Acid, Venous: 1.3 [MV]    Clinical Course User Index [MV] Eustaquio Maize, PA-C   MDM Rules/Calculators/A&P                           70 year old  female who presents to the ED today after being called due to hyponatremia of 112 pulmonology office today.  Has been experiencing cough, chest congestion, shortness of breath for the last 2 days.  Found to have significantly elevated BNP at ED visit 2 days ago prior to being seen History of CHF.  Arrival to the ED today patient is nontachycardic.  She is noted to however O2 sat 93%.  She was tachypneic with bibasilar rales throughout.  Is also noted to be warm to the touch with rectal temp of 100.0.  Will start septic protocol at this time and started on empiric abx. RT and attending physician evaluated patient - will start on BiPAP at this time. Pt to be admitted. NTG provided for elevated BP > 160.   CXR: IMPRESSION:  1. Diffuse, chronic appearing increased interstitial lung markings,  stable in appearance when compared to the prior exam.  2. Mild, stable bibasilar atelectasis.      CBC with worsening leukocytosis 24,800 with left shift.  CMP With sodium 116; will hold off on fluids at this time given concern for CHF exacerbation. Creatinine worsening at 1.86 and BUN 29.   Lab Results  Component Value Date   CREATININE 1.86 (H) 03/21/2021   CREATININE 1.38 (H) 03/19/2021   CREATININE 0.78 05/11/2020   BNP worsening at 1,399.1. IV lasix ordered.   COVID and flu negative  Discussed case with Dr. Tonie Griffith [facility] who agrees to accept patient for admission. Recommends CT Chest non con for further eval.   This note was prepared using Dragon voice recognition software and may include unintentional dictation errors due to the inherent limitations of voice recognition software.  Final Clinical Impression(s) / ED Diagnoses Final diagnoses:  Acute on chronic congestive heart failure, unspecified heart failure type ([place])  Hyponatremia  Leukocytosis, unspecified type  Acute respiratory distress    Rx / DC Orders ED Discharge Orders     None        Eustaquio Maize,  PA-C 03/21/21 2105    Teressa Lower, MD 03/22/21 [phone]

## 2021-03-21 NOTE — Assessment & Plan Note (Addendum)
-   Patient reports chest congestion for last several days with decreased oxygen levels. Her weight is up 7 lbs from her baseline. O2 95% RA today. She had rales on exam at bilateral based. CXR on 03/19/21 showed cardiac enlargement with pulmonary vascular congestion. BNP was 1,139 and NA 121. Plan start lasix 40mg  daily and advised patient to follow 1-1.5L fluid restriction. She has an apt with cardiology tomorrow.

## 2021-03-21 NOTE — Progress Notes (Signed)
Pharmacy Antibiotic Note  Sheila Morales is a 70 y.o. female admitted on 03/21/2021 with sepsis.  Pharmacy has been consulted for cefepime and vancomycin dosing.  Patient's serum creatinine is 1.86 which is significantly above baseline. WBC 24.8; LA 0.9; T 100 F  Plan: Flagyl per MD Cefepime 2g q24h unless change in renal function Vancomycin 1000 mg once given at [facility], subsequent dosing as indicated per random vancomycin level until renal function stable and/or improved, at which time scheduled dosing can be considered Trend WBC, fever, renal function, and clinical course Follow-up cultures and de-escalate antibiotics as appropriate.  Height: 5\' 3"  (160 cm) Weight: 61.5 kg (135 lb 9.6 oz) IBW/kg (Calculated) : 52.4  Temp (24hrs), Avg:99.2 F (37.3 C), Min:98.8 F (37.1 C), Max:100 F (37.8 C)  Recent Labs  Lab 03/19/21 1226 03/21/21 1915 03/21/21 2110  WBC 20.9* 24.8*  --   CREATININE 1.38* 1.86*  --   LATICACIDVEN  --  1.3 0.9    Estimated Creatinine Clearance: 22.9 mL/min (A) (by C-G formula based on SCr of 1.86 mg/dL (H)).    No Known Allergies  Antimicrobials this admission: flagyl 11/30 >>  cefepime 11/30 >>  vancomycin 11/30 >>   Microbiology results: Pending  Thank you for allowing pharmacy to be a part of this patient's care.  Lorelei Pont, PharmD, BCPS 03/21/2021 10:05 PM ED Clinical Pharmacist -  [phone]

## 2021-03-21 NOTE — Progress Notes (Signed)
@Patient  ID: Sheila Morales, female    DOB: 09/22/1950, 70 y.o.   MRN: [id number]  Chief Complaint  Patient presents with   Acute Visit    Referring provider: Hoyt Koch, *  HPI: 70 year old female, former smoker. PMH significant for COPD, HTN, systemic lupus, long term methotrexate use. Patient of Dr. Lamonte Sakai, last seen on 08/24/20.  03/21/2021 Patient presents today for acute OV. She has had increased chest congestion since Sunday November 27th. She has a cough which is non-productive. She went to ED on Monday and waited 11 hours. She was given script for tessalon perles. O2 at home was originally 83% RA. Her husband states that her breathing improved yesterday, her O2 was 93% in the morning and last night was 98%. She is still not 100% better. Her appetite is decreased and she seems to be a little dazed. She is compliant with ANORO taking one puff in the morinng and using albuterol every 6 hours. Her weight is usually between 125-130. Her weight today is 137lbs.    No Known Allergies  Immunization History  Administered Date(s) Administered   Fluad Quad(high Dose 65+) 03/29/2019   H1N1 05/31/2008   Influenza Split 02/13/2012   Influenza Whole 01/21/2008, 01/17/2009   Influenza, High Dose Seasonal PF 02/19/2016, 02/05/2018   Influenza,inj,Quad PF,6+ Mos 01/21/2014   Influenza-Unspecified 02/02/2015, 02/03/2017   Pneumococcal Conjugate-13 02/19/2016   Pneumococcal Polysaccharide-23 02/24/2017   Tdap 03/29/2019    Past Medical History:  Diagnosis Date   Acute respiratory failure with hypoxia ([place]) 11/05/2017   Arthritis    Colitis, ischemic (HCC)    COPD (chronic obstructive pulmonary disease) (HCC)    External hemorrhoids    H/O: rheumatic fever    IBS (irritable bowel syndrome)    Personal history colonic adenoma 03/25/2008   06/2007 right sided adenoma and 2 right hyperplastic polyps     Restless leg syndrome    questionable   Systemic lupus erythematosus (HCC)     Tubular adenoma of colon    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     Tobacco History: Social History   Tobacco Use  Smoking Status Every Day   Packs/day: 0.25   Years: 30.00   Pack years: 7.50   Types: Cigarettes   Last attempt to quit: 01/21/2020   Years since quitting: 1.1  Smokeless Tobacco Never  Tobacco Comments   smokes "every now and then" 08/24/20 [facility]    Ready to quit: Not Answered Counseling given: Not Answered Tobacco comments: smokes "every now and then" 08/24/20 [facility]    Outpatient Medications Prior to Visit  Medication Sig Dispense Refill   albuterol (VENTOLIN HFA) 108 (90 Base) MCG/ACT inhaler Inhale 1-2 puffs into the lungs every 6 (six) hours as needed for wheezing or shortness of breath. 8 g 5   ANORO ELLIPTA 62.5-25 MCG/ACT AEPB INHALE 1 PUFF INTO THE LUNGS DAILY. 60 each 0   aspirin 81 MG tablet Take 81 mg by mouth daily.     benzonatate (TESSALON) 200 MG capsule Take 1 capsule (200 mg total) by mouth every 6 (six) hours as needed for cough. 30 capsule 1   BLACK COHOSH PO Take 1 capsule by mouth daily.     Calcium Carb-Cholecalciferol (CALCIUM 600 + D PO) Take 1 tablet by mouth daily.     calcium carbonate (OS-CAL - DOSED IN MG OF ELEMENTAL CALCIUM) 1250 (500 Ca) MG tablet Take 1 tablet by mouth daily as needed (heartburn).  Cholecalciferol (VITAMIN D) 50 MCG (2000 UT) tablet Take 2,000 Units by mouth daily.     dicyclomine (BENTYL) 10 MG capsule Take 1 capsule (10 mg total) by mouth 4 (four) times daily -  before meals and at bedtime. 90 capsule 0   folic acid (FOLVITE) 1 MG tablet Take 1 mg by mouth daily.     gabapentin (NEURONTIN) 300 MG capsule TAKE TWO CAPSULES BY MOUTH EVERY MORNING, TWO CAPSULES EACH AFTERNOON AND THREE CAPSULES AT BEDTIME 630 capsule 0   hydroxychloroquine (PLAQUENIL) 200 MG tablet Take 200 mg by mouth 2 (two) times daily.     ibuprofen (ADVIL) 200 MG tablet Take 400 mg by mouth every 6 (six) hours as needed for headache or mild pain.      methotrexate 2.5 MG tablet Take 10 mg by mouth every Thursday.     Misc Natural Products (ADVANCED JOINT RELIEF) CAPS Take 1 capsule by mouth daily. Joint Advantage Gold 5x     Multiple Vitamin (MULTIVITAMIN WITH MINERALS) TABS tablet Take 1 tablet by mouth daily.     Omega-3 Fatty Acids (OMEGA 3 PO) Take 1 capsule by mouth daily.     OVER THE COUNTER MEDICATION Take 1 capsule by mouth daily. brain essentials otc supplement     Polyethyl Glycol-Propyl Glycol (SYSTANE OP) Place 1 drop into both eyes 2 (two) times daily.     RESTASIS 0.05 % ophthalmic emulsion Place 1 drop into both eyes 2 (two) times daily.  6   telmisartan (MICARDIS) 40 MG tablet TAKE TWO TABLETS BY MOUTH ONCE DAILY 180 tablet 5   vitamin C (ASCORBIC ACID) 500 MG tablet Take 500 mg by mouth daily.     vitamin E 180 MG (400 UNITS) capsule Take 400 Units by mouth daily.     zolpidem (AMBIEN) 5 MG tablet Take 1 tablet (5 mg total) by mouth at bedtime as needed for sleep. 30 tablet 5   saccharomyces boulardii (FLORASTOR) 250 MG capsule Take 250 mg by mouth 2 (two) times daily. (Patient not taking: Reported on 03/21/2021)     No facility-administered medications prior to visit.    Review of Systems  Review of Systems  Constitutional: Negative.   HENT:  Positive for congestion.   Respiratory:  Positive for cough. Negative for chest tightness, shortness of breath and wheezing.   Neurological: Negative.   Psychiatric/Behavioral:  Positive for decreased concentration.     Physical Exam  BP 112/68 (BP Location: Right Arm, Cuff Size: Normal)   Pulse 73   Temp 98.8 F (37.1 C)   Ht 5\' 3"  (1.6 m)   Wt 137 lb 12.8 oz (62.5 kg)   SpO2 95%   BMI 24.41 kg/m  Physical Exam Constitutional:      General: She is not in acute distress.    Appearance: Normal appearance. She is normal weight.  HENT:     Head: Normocephalic and atraumatic.     Mouth/Throat:     Mouth: Mucous membranes are moist.     Pharynx: Oropharynx is  clear.  Cardiovascular:     Rate and Rhythm: Normal rate and regular rhythm.  Pulmonary:     Effort: Pulmonary effort is normal.     Breath sounds: Rales present. No wheezing or rhonchi.     Comments: Rales/crackles as bilateral bases; O2 95% RA  Neurological:     General: No focal deficit present.     Mental Status: She is alert and oriented to person, place, and time. Mental  status is at baseline.  Psychiatric:        Mood and Affect: Mood normal.        Behavior: Behavior normal.        Thought Content: Thought content normal.        Judgment: Judgment normal.     Comments: Alert and oriented, responds to verbal cues but delayed      Lab Results:  CBC    Component Value Date/Time   WBC 20.9 (H) 03/19/2021 1226   RBC 3.65 (L) 03/19/2021 1226   HGB 12.0 03/19/2021 1226   HGB 11.8 05/11/2020 1203   HGB 13.1 01/13/2009 1108   HCT 34.6 (L) 03/19/2021 1226   HCT 35.0 05/11/2020 1203   HCT 37.5 01/13/2009 1108   PLT 159 03/19/2021 1226   PLT 153 05/11/2020 1203   MCV 94.8 03/19/2021 1226   MCV 92 05/11/2020 1203   MCV 86.2 01/13/2009 1108   MCH 32.9 03/19/2021 1226   MCHC 34.7 03/19/2021 1226   RDW 13.4 03/19/2021 1226   RDW 14.7 05/11/2020 1203   RDW 12.2 01/13/2009 1108   LYMPHSABS 0.8 03/19/2021 1226   LYMPHSABS 1.2 05/11/2020 1203   LYMPHSABS 2.2 01/13/2009 1108   MONOABS 1.4 (H) 03/19/2021 1226   MONOABS 0.7 01/13/2009 1108   EOSABS 0.0 03/19/2021 1226   EOSABS 0.1 05/11/2020 1203   BASOSABS 0.0 03/19/2021 1226   BASOSABS 0.0 05/11/2020 1203   BASOSABS 0.0 01/13/2009 1108    BMET    Component Value Date/Time   NA 121 (L) 03/19/2021 1226   NA 139 05/11/2020 1203   K 4.8 03/19/2021 1226   CL 90 (L) 03/19/2021 1226   CO2 23 03/19/2021 1226   GLUCOSE 101 (H) 03/19/2021 1226   BUN 14 03/19/2021 1226   BUN 20 05/11/2020 1203   CREATININE 1.38 (H) 03/19/2021 1226   CREATININE 0.91 11/22/2019 1502   CALCIUM 8.8 (L) 03/19/2021 1226   GFRNONAA 41 (L)  03/19/2021 1226   GFRAA 89 05/11/2020 1203    BNP    Component Value Date/Time   BNP 1,149.3 (H) 03/19/2021 1226    ProBNP No results found for: PROBNP  Imaging: DG Chest 2 View  Result Date: 03/19/2021 CLINICAL DATA:  Shortness of breath, cough, and fatigue for few days EXAM: CHEST - 2 VIEW COMPARISON:  05/26/2020 FINDINGS: LEFT subclavian sequential transvenous pacemaker leads project at RIGHT atrium and RIGHT ventricle. Enlargement of cardiac silhouette with pulmonary vascular congestion. Mild chronic accentuation of interstitial markings similar to prior exam. Minimal subsegmental atelectasis at lung bases. No definite acute infiltrate, pleural effusion, or pneumothorax. Minimal atherosclerotic calcification aorta. Bones demineralized. IMPRESSION: Enlargement of cardiac silhouette with pulmonary vascular congestion. Chronic accentuation of interstitial markings with minimal bibasilar atelectasis. Aortic Atherosclerosis (ICD10-I70.0). Electronically Signed   By: Lavonia Dana M.D.   On: 03/19/2021 13:04   CUP PACEART REMOTE DEVICE CHECK  Result Date: 02/21/2021 Scheduled remote reviewed. Normal device function.  Next remote scheduled in 91 days. Kathy Breach, RN, CCDS, [employer]    Assessment & Plan:   Pulmonary vascular congestion - Patient reports chest congestion for last several days with decreased oxygen levels. Her weight is up 7 lbs from her baseline. O2 95% RA today. She had rales on exam at bilateral based. CXR on 03/19/21 showed cardiac enlargement with pulmonary vascular congestion. BNP was 1,139 and NA 121. Plan start lasix 40mg  daily and advised patient to follow 1-1.5L fluid restriction. She has an apt with  cardiology tomorrow.   COPD with acute exacerbation ([place]) -  Patient developed NP cough 3 days ago. Her WBC was elevated on labs from ED on 11/28. CXR did not showed chronic accentuation of interstitial markings with minimal bibasilar atelectasis. No acute  infiltrate. Sending in RX doxycycline to covered for COPD exacerbation. Continue Anoro one puff daily and prn albuterol. No indication for oral steroids as she had no wheezing on exam and was not acutely short of breath. I think most of her symptoms are related to HF but could have superimposed infection as well.   40 mins spent on case; >50% face to face with patient   Martyn Ehrich, NP 03/21/2021

## 2021-03-21 NOTE — Assessment & Plan Note (Addendum)
-    Patient developed NP cough 3 days ago. Her WBC was elevated on labs from ED on 11/28. CXR did not showed chronic accentuation of interstitial markings with minimal bibasilar atelectasis. No acute infiltrate. Sending in RX doxycycline to covered for COPD exacerbation. Continue Anoro one puff daily and prn albuterol. No indication for oral steroids as she had no wheezing on exam and was not acutely short of breath. I think most of her symptoms are related to HF but could have superimposed infection as well.

## 2021-03-21 NOTE — ED Notes (Signed)
Called to room 3 for RT assessment, RR 35, BBS fine rales posterior, placed on 2lpm [place].

## 2021-03-21 NOTE — Telephone Encounter (Signed)
Critical lab result on patient, Sodium 112. Advised patient's husband to bring her to ED. They will be going to [facility]. We will call ED to notify them of her arrival.

## 2021-03-21 NOTE — ED Triage Notes (Signed)
Pt arrives with husband who reports patients sodium has been low also reports AMS

## 2021-03-22 ENCOUNTER — Ambulatory Visit: Payer: PPO | Admitting: Internal Medicine

## 2021-03-22 ENCOUNTER — Other Ambulatory Visit (HOSPITAL_COMMUNITY): Payer: Self-pay

## 2021-03-22 ENCOUNTER — Other Ambulatory Visit: Payer: Self-pay

## 2021-03-22 ENCOUNTER — Inpatient Hospital Stay (HOSPITAL_COMMUNITY): Payer: PPO

## 2021-03-22 DIAGNOSIS — B9620 Unspecified Escherichia coli [E. coli] as the cause of diseases classified elsewhere: Secondary | ICD-10-CM | POA: Diagnosis not present

## 2021-03-22 DIAGNOSIS — G2581 Restless legs syndrome: Secondary | ICD-10-CM | POA: Diagnosis not present

## 2021-03-22 DIAGNOSIS — J449 Chronic obstructive pulmonary disease, unspecified: Secondary | ICD-10-CM | POA: Diagnosis not present

## 2021-03-22 DIAGNOSIS — I5031 Acute diastolic (congestive) heart failure: Secondary | ICD-10-CM | POA: Diagnosis not present

## 2021-03-22 DIAGNOSIS — E872 Acidosis, unspecified: Secondary | ICD-10-CM | POA: Diagnosis not present

## 2021-03-22 DIAGNOSIS — J90 Pleural effusion, not elsewhere classified: Secondary | ICD-10-CM | POA: Diagnosis not present

## 2021-03-22 DIAGNOSIS — E222 Syndrome of inappropriate secretion of antidiuretic hormone: Secondary | ICD-10-CM | POA: Diagnosis not present

## 2021-03-22 DIAGNOSIS — R1900 Intra-abdominal and pelvic swelling, mass and lump, unspecified site: Secondary | ICD-10-CM | POA: Diagnosis not present

## 2021-03-22 DIAGNOSIS — I700 Atherosclerosis of aorta: Secondary | ICD-10-CM | POA: Diagnosis not present

## 2021-03-22 DIAGNOSIS — K5730 Diverticulosis of large intestine without perforation or abscess without bleeding: Secondary | ICD-10-CM | POA: Diagnosis not present

## 2021-03-22 DIAGNOSIS — R0602 Shortness of breath: Secondary | ICD-10-CM | POA: Diagnosis not present

## 2021-03-22 DIAGNOSIS — I2510 Atherosclerotic heart disease of native coronary artery without angina pectoris: Secondary | ICD-10-CM | POA: Diagnosis not present

## 2021-03-22 DIAGNOSIS — M329 Systemic lupus erythematosus, unspecified: Secondary | ICD-10-CM | POA: Diagnosis not present

## 2021-03-22 DIAGNOSIS — E871 Hypo-osmolality and hyponatremia: Secondary | ICD-10-CM | POA: Diagnosis not present

## 2021-03-22 DIAGNOSIS — N170 Acute kidney failure with tubular necrosis: Secondary | ICD-10-CM | POA: Diagnosis not present

## 2021-03-22 DIAGNOSIS — I509 Heart failure, unspecified: Secondary | ICD-10-CM | POA: Diagnosis not present

## 2021-03-22 DIAGNOSIS — G4700 Insomnia, unspecified: Secondary | ICD-10-CM | POA: Diagnosis not present

## 2021-03-22 DIAGNOSIS — R4182 Altered mental status, unspecified: Secondary | ICD-10-CM | POA: Diagnosis not present

## 2021-03-22 DIAGNOSIS — Z28310 Unvaccinated for covid-19: Secondary | ICD-10-CM | POA: Diagnosis not present

## 2021-03-22 DIAGNOSIS — D849 Immunodeficiency, unspecified: Secondary | ICD-10-CM | POA: Diagnosis not present

## 2021-03-22 DIAGNOSIS — R059 Cough, unspecified: Secondary | ICD-10-CM | POA: Diagnosis not present

## 2021-03-22 DIAGNOSIS — J431 Panlobular emphysema: Secondary | ICD-10-CM | POA: Diagnosis not present

## 2021-03-22 DIAGNOSIS — E876 Hypokalemia: Secondary | ICD-10-CM | POA: Diagnosis not present

## 2021-03-22 DIAGNOSIS — I5033 Acute on chronic diastolic (congestive) heart failure: Secondary | ICD-10-CM | POA: Diagnosis not present

## 2021-03-22 DIAGNOSIS — J9600 Acute respiratory failure, unspecified whether with hypoxia or hypercapnia: Secondary | ICD-10-CM | POA: Diagnosis not present

## 2021-03-22 DIAGNOSIS — R7881 Bacteremia: Secondary | ICD-10-CM | POA: Diagnosis not present

## 2021-03-22 DIAGNOSIS — J811 Chronic pulmonary edema: Secondary | ICD-10-CM | POA: Diagnosis not present

## 2021-03-22 DIAGNOSIS — F17210 Nicotine dependence, cigarettes, uncomplicated: Secondary | ICD-10-CM | POA: Diagnosis not present

## 2021-03-22 DIAGNOSIS — R0989 Other specified symptoms and signs involving the circulatory and respiratory systems: Secondary | ICD-10-CM | POA: Diagnosis not present

## 2021-03-22 DIAGNOSIS — J189 Pneumonia, unspecified organism: Secondary | ICD-10-CM | POA: Diagnosis not present

## 2021-03-22 DIAGNOSIS — D72829 Elevated white blood cell count, unspecified: Secondary | ICD-10-CM | POA: Diagnosis not present

## 2021-03-22 DIAGNOSIS — N12 Tubulo-interstitial nephritis, not specified as acute or chronic: Secondary | ICD-10-CM | POA: Diagnosis not present

## 2021-03-22 DIAGNOSIS — Z720 Tobacco use: Secondary | ICD-10-CM

## 2021-03-22 DIAGNOSIS — I5023 Acute on chronic systolic (congestive) heart failure: Secondary | ICD-10-CM | POA: Diagnosis not present

## 2021-03-22 DIAGNOSIS — J9811 Atelectasis: Secondary | ICD-10-CM | POA: Diagnosis not present

## 2021-03-22 DIAGNOSIS — I480 Paroxysmal atrial fibrillation: Secondary | ICD-10-CM | POA: Diagnosis not present

## 2021-03-22 DIAGNOSIS — I4891 Unspecified atrial fibrillation: Secondary | ICD-10-CM | POA: Diagnosis not present

## 2021-03-22 DIAGNOSIS — A4151 Sepsis due to Escherichia coli [E. coli]: Secondary | ICD-10-CM | POA: Diagnosis not present

## 2021-03-22 DIAGNOSIS — I110 Hypertensive heart disease with heart failure: Secondary | ICD-10-CM | POA: Diagnosis not present

## 2021-03-22 DIAGNOSIS — I4819 Other persistent atrial fibrillation: Secondary | ICD-10-CM | POA: Diagnosis not present

## 2021-03-22 DIAGNOSIS — R0603 Acute respiratory distress: Secondary | ICD-10-CM | POA: Diagnosis not present

## 2021-03-22 DIAGNOSIS — Z20822 Contact with and (suspected) exposure to covid-19: Secondary | ICD-10-CM | POA: Diagnosis not present

## 2021-03-22 DIAGNOSIS — D6489 Other specified anemias: Secondary | ICD-10-CM | POA: Diagnosis not present

## 2021-03-22 DIAGNOSIS — N179 Acute kidney failure, unspecified: Secondary | ICD-10-CM | POA: Diagnosis not present

## 2021-03-22 DIAGNOSIS — R0600 Dyspnea, unspecified: Secondary | ICD-10-CM | POA: Diagnosis not present

## 2021-03-22 DIAGNOSIS — I10 Essential (primary) hypertension: Secondary | ICD-10-CM

## 2021-03-22 DIAGNOSIS — E785 Hyperlipidemia, unspecified: Secondary | ICD-10-CM | POA: Diagnosis not present

## 2021-03-22 DIAGNOSIS — J9602 Acute respiratory failure with hypercapnia: Secondary | ICD-10-CM | POA: Diagnosis not present

## 2021-03-22 DIAGNOSIS — J432 Centrilobular emphysema: Secondary | ICD-10-CM | POA: Diagnosis not present

## 2021-03-22 DIAGNOSIS — R918 Other nonspecific abnormal finding of lung field: Secondary | ICD-10-CM | POA: Diagnosis not present

## 2021-03-22 DIAGNOSIS — G9341 Metabolic encephalopathy: Secondary | ICD-10-CM | POA: Diagnosis present

## 2021-03-22 DIAGNOSIS — Z95810 Presence of automatic (implantable) cardiac defibrillator: Secondary | ICD-10-CM | POA: Diagnosis not present

## 2021-03-22 DIAGNOSIS — J9601 Acute respiratory failure with hypoxia: Secondary | ICD-10-CM | POA: Diagnosis not present

## 2021-03-22 DIAGNOSIS — I517 Cardiomegaly: Secondary | ICD-10-CM | POA: Diagnosis not present

## 2021-03-22 DIAGNOSIS — J439 Emphysema, unspecified: Secondary | ICD-10-CM | POA: Diagnosis not present

## 2021-03-22 LAB — CBC WITH DIFFERENTIAL/PLATELET
Abs Immature Granulocytes: 0.11 10*3/uL — ABNORMAL HIGH (ref 0.00–0.07)
Basophils Absolute: 0.1 10*3/uL (ref 0.0–0.1)
Basophils Relative: 0 %
Eosinophils Absolute: 0 10*3/uL (ref 0.0–0.5)
Eosinophils Relative: 0 %
HCT: 36.6 % (ref 36.0–46.0)
Hemoglobin: 12.8 g/dL (ref 12.0–15.0)
Immature Granulocytes: 1 %
Lymphocytes Relative: 2 %
Lymphs Abs: 0.4 10*3/uL — ABNORMAL LOW (ref 0.7–4.0)
MCH: 32.2 pg (ref 26.0–34.0)
MCHC: 35 g/dL (ref 30.0–36.0)
MCV: 92.2 fL (ref 80.0–100.0)
Monocytes Absolute: 2.1 10*3/uL — ABNORMAL HIGH (ref 0.1–1.0)
Monocytes Relative: 11 %
Neutro Abs: 16.4 10*3/uL — ABNORMAL HIGH (ref 1.7–7.7)
Neutrophils Relative %: 86 %
Platelets: 153 10*3/uL (ref 150–400)
RBC: 3.97 MIL/uL (ref 3.87–5.11)
RDW: 13.5 % (ref 11.5–15.5)
WBC: 19.1 10*3/uL — ABNORMAL HIGH (ref 4.0–10.5)
nRBC: 0 % (ref 0.0–0.2)

## 2021-03-22 LAB — BASIC METABOLIC PANEL
Anion gap: 11 (ref 5–15)
Anion gap: 12 (ref 5–15)
BUN: 31 mg/dL — ABNORMAL HIGH (ref 8–23)
BUN: 35 mg/dL — ABNORMAL HIGH (ref 8–23)
CO2: 19 mmol/L — ABNORMAL LOW (ref 22–32)
CO2: 20 mmol/L — ABNORMAL LOW (ref 22–32)
Calcium: 8.5 mg/dL — ABNORMAL LOW (ref 8.9–10.3)
Calcium: 8.2 mg/dL — ABNORMAL LOW (ref 8.9–10.3)
Chloride: 89 mmol/L — ABNORMAL LOW (ref 98–111)
Chloride: 89 mmol/L — ABNORMAL LOW (ref 98–111)
Creatinine, Ser: 2.06 mg/dL — ABNORMAL HIGH (ref 0.44–1.00)
Creatinine, Ser: 2.07 mg/dL — ABNORMAL HIGH (ref 0.44–1.00)
GFR, Estimated: 25 mL/min — ABNORMAL LOW (ref >60)
GFR, Estimated: 25 mL/min — ABNORMAL LOW (ref >60)
Glucose, Bld: 68 mg/dL — ABNORMAL LOW (ref 70–99)
Glucose, Bld: 102 mg/dL — ABNORMAL HIGH (ref 70–99)
Potassium: 4.2 mmol/L (ref 3.5–5.1)
Potassium: 4 mmol/L (ref 3.5–5.1)
Sodium: 119 mmol/L — CL (ref 135–145)
Sodium: 121 mmol/L — ABNORMAL LOW (ref 135–145)

## 2021-03-22 LAB — BLOOD CULTURE ID PANEL (REFLEXED) - BCID2

## 2021-03-22 LAB — COMPREHENSIVE METABOLIC PANEL
ALT: 26 U/L (ref 0–44)
AST: 42 U/L — ABNORMAL HIGH (ref 15–41)
Albumin: 3 g/dL — ABNORMAL LOW (ref 3.5–5.0)
Alkaline Phosphatase: 122 U/L (ref 38–126)
Anion gap: 12 (ref 5–15)
BUN: 32 mg/dL — ABNORMAL HIGH (ref 8–23)
CO2: 21 mmol/L — ABNORMAL LOW (ref 22–32)
Calcium: 8.9 mg/dL (ref 8.9–10.3)
Chloride: 91 mmol/L — ABNORMAL LOW (ref 98–111)
Creatinine, Ser: 2.11 mg/dL — ABNORMAL HIGH (ref 0.44–1.00)
GFR, Estimated: 25 mL/min — ABNORMAL LOW (ref >60)
Glucose, Bld: 111 mg/dL — ABNORMAL HIGH (ref 70–99)
Potassium: 4 mmol/L (ref 3.5–5.1)
Sodium: 124 mmol/L — ABNORMAL LOW (ref 135–145)
Total Bilirubin: 1 mg/dL (ref 0.3–1.2)
Total Protein: 6.9 g/dL (ref 6.5–8.1)

## 2021-03-22 LAB — URINALYSIS, MICROSCOPIC (REFLEX)

## 2021-03-22 LAB — VANCOMYCIN, RANDOM: Vancomycin Rm: 16

## 2021-03-22 LAB — GLUCOSE, CAPILLARY
Glucose-Capillary: 50 mg/dL — ABNORMAL LOW (ref 70–99)
Glucose-Capillary: 213 mg/dL — ABNORMAL HIGH (ref 70–99)
Glucose-Capillary: 118 mg/dL — ABNORMAL HIGH (ref 70–99)

## 2021-03-22 LAB — LACTIC ACID, PLASMA
Lactic Acid, Venous: 1.3 mmol/L (ref 0.5–1.9)
Lactic Acid, Venous: 1 mmol/L (ref 0.5–1.9)

## 2021-03-22 LAB — BRAIN NATRIURETIC PEPTIDE: B Natriuretic Peptide: 1498.1 pg/mL — ABNORMAL HIGH (ref 0.0–100.0)

## 2021-03-22 LAB — MAGNESIUM: Magnesium: 2 mg/dL (ref 1.7–2.4)

## 2021-03-22 LAB — CREATININE, URINE, RANDOM: Creatinine, Urine: 18.53 mg/dL

## 2021-03-22 LAB — MRSA NEXT GEN BY PCR, NASAL: MRSA by PCR Next Gen: NOT DETECTED

## 2021-03-22 LAB — CBC
HCT: 31.6 % — ABNORMAL LOW (ref 36.0–46.0)
Hemoglobin: 11.4 g/dL — ABNORMAL LOW (ref 12.0–15.0)
MCH: 32.4 pg (ref 26.0–34.0)
MCHC: 36.1 g/dL — ABNORMAL HIGH (ref 30.0–36.0)
MCV: 89.8 fL (ref 80.0–100.0)
Platelets: 141 10*3/uL — ABNORMAL LOW (ref 150–400)
RBC: 3.52 MIL/uL — ABNORMAL LOW (ref 3.87–5.11)
RDW: 13.3 % (ref 11.5–15.5)
WBC: 19.4 10*3/uL — ABNORMAL HIGH (ref 4.0–10.5)
nRBC: 0 % (ref 0.0–0.2)

## 2021-03-22 LAB — ECHOCARDIOGRAM COMPLETE
Area-P 1/2: 3.27 cm2
Calc EF: 67.1 %
Height: 62 in
MV VTI: 1.79 cm2
S' Lateral: 2.5 cm
Single Plane A2C EF: 62.2 %
Single Plane A4C EF: 69.8 %
Weight: 2158.74 oz

## 2021-03-22 LAB — URINALYSIS, ROUTINE W REFLEX MICROSCOPIC
Bilirubin Urine: NEGATIVE
Glucose, UA: NEGATIVE mg/dL
Ketones, ur: NEGATIVE mg/dL
Nitrite: NEGATIVE
Protein, ur: NEGATIVE mg/dL
Specific Gravity, Urine: 1.01 (ref 1.005–1.030)
pH: 6.5 (ref 5.0–8.0)

## 2021-03-22 LAB — PROTEIN / CREATININE RATIO, URINE
Creatinine, Urine: 18.6 mg/dL
Protein Creatinine Ratio: 0.65 mg/mg{Cre} — ABNORMAL HIGH (ref 0.00–0.15)
Total Protein, Urine: 12 mg/dL

## 2021-03-22 LAB — BLOOD GAS, VENOUS
Acid-base deficit: 3.9 mmol/L — ABNORMAL HIGH (ref 0.0–2.0)
Bicarbonate: 20.6 mmol/L (ref 20.0–28.0)
Drawn by: 6775
FIO2: 28
O2 Saturation: 89 %
Patient temperature: 37
pCO2, Ven: 36.8 mmHg — ABNORMAL LOW (ref 44.0–60.0)
pH, Ven: 7.367 (ref 7.250–7.430)
pO2, Ven: 58 mmHg — ABNORMAL HIGH (ref 32.0–45.0)

## 2021-03-22 LAB — SODIUM, URINE, RANDOM: Sodium, Ur: 67 mmol/L

## 2021-03-22 LAB — TSH: TSH: 0.909 u[IU]/mL (ref 0.350–4.500)

## 2021-03-22 LAB — HEPARIN LEVEL (UNFRACTIONATED): Heparin Unfractionated: <0.1 IU/mL — ABNORMAL LOW (ref 0.30–0.70)

## 2021-03-22 LAB — PHOSPHORUS: Phosphorus: 4.4 mg/dL (ref 2.5–4.6)

## 2021-03-22 LAB — STREP PNEUMONIAE URINARY ANTIGEN: Strep Pneumo Urinary Antigen: NEGATIVE

## 2021-03-22 MED ORDER — ACETAMINOPHEN 650 MG RE SUPP: 650.0000 mg | Freq: Four times a day (QID) | RECTAL | Status: DC | PRN

## 2021-03-22 MED ORDER — AMIODARONE HCL IN DEXTROSE 360-4.14 MG/200ML-% IV SOLN
30.0000 mg/h | INTRAVENOUS | Status: DC
  Administered 2021-03-22 – 2021-03-25 (×10): 60 mg/h via INTRAVENOUS
  Administered 2021-03-26: 30 mg/h via INTRAVENOUS
  Administered 2021-03-26: 60 mg/h via INTRAVENOUS
  Administered 2021-03-26 – 2021-03-28 (×4): 30 mg/h via INTRAVENOUS
  Filled 2021-03-22 (×16): qty 200

## 2021-03-22 MED ORDER — HEPARIN SODIUM (PORCINE) 5000 UNIT/ML IJ SOLN
5000.0000 [IU] | Freq: Three times a day (TID) | INTRAMUSCULAR | Status: DC
  Administered 2021-03-22: 5000 [IU] via SUBCUTANEOUS
  Filled 2021-03-22: qty 1

## 2021-03-22 MED ORDER — GABAPENTIN 300 MG PO CAPS: 900.0000 mg | ORAL_CAPSULE | Freq: Every day | ORAL | Status: DC

## 2021-03-22 MED ORDER — BENZONATATE 100 MG PO CAPS
200.0000 mg | ORAL_CAPSULE | Freq: Four times a day (QID) | ORAL | Status: DC | PRN
  Administered 2021-03-30: 200 mg via ORAL
  Filled 2021-03-22: qty 2

## 2021-03-22 MED ORDER — METOPROLOL TARTRATE 5 MG/5ML IV SOLN
2.5000 mg | Freq: Once | INTRAVENOUS | Status: AC
  Administered 2021-03-22: 2.5 mg via INTRAVENOUS
  Filled 2021-03-22: qty 5

## 2021-03-22 MED ORDER — ASPIRIN EC 81 MG PO TBEC
81.0000 mg | DELAYED_RELEASE_TABLET | Freq: Every day | ORAL | Status: DC
  Administered 2021-03-22 – 2021-04-01 (×10): 81 mg via ORAL
  Filled 2021-03-22 (×11): qty 1

## 2021-03-22 MED ORDER — DILTIAZEM HCL-DEXTROSE 125-5 MG/125ML-% IV SOLN (PREMIX)
5.0000 mg/h | INTRAVENOUS | Status: DC
  Administered 2021-03-22: 5 mg/h via INTRAVENOUS
  Filled 2021-03-22 (×2): qty 125

## 2021-03-22 MED ORDER — ONDANSETRON HCL 4 MG/2ML IJ SOLN: 4.0000 mg | Freq: Four times a day (QID) | INTRAMUSCULAR | Status: DC | PRN

## 2021-03-22 MED ORDER — METOPROLOL TARTRATE 5 MG/5ML IV SOLN
INTRAVENOUS | Status: AC
  Filled 2021-03-22: qty 5

## 2021-03-22 MED ORDER — IRBESARTAN 300 MG PO TABS
150.0000 mg | ORAL_TABLET | Freq: Every day | ORAL | Status: DC
  Administered 2021-03-22: 150 mg via ORAL
  Filled 2021-03-22: qty 1

## 2021-03-22 MED ORDER — SODIUM CHLORIDE 0.9 % IV SOLN
2.0000 g | INTRAVENOUS | Status: DC
  Administered 2021-03-22 – 2021-03-25 (×4): 2 g via INTRAVENOUS
  Filled 2021-03-22 (×4): qty 20

## 2021-03-22 MED ORDER — OXYCODONE HCL 5 MG PO TABS
5.0000 mg | ORAL_TABLET | ORAL | Status: DC | PRN
  Administered 2021-03-26 (×2): 5 mg via ORAL
  Filled 2021-03-22 (×2): qty 1

## 2021-03-22 MED ORDER — DEXTROSE 50 % IV SOLN
INTRAVENOUS | Status: AC
  Administered 2021-03-22: 50 mL
  Filled 2021-03-22: qty 50

## 2021-03-22 MED ORDER — HEPARIN (PORCINE) 25000 UT/250ML-% IV SOLN
1700.0000 [IU]/h | INTRAVENOUS | Status: AC
  Administered 2021-03-22: 14:00:00 800 [IU]/h via INTRAVENOUS
  Administered 2021-03-24: 05:00:00 1350 [IU]/h via INTRAVENOUS
  Administered 2021-03-25: 18:00:00 1450 [IU]/h via INTRAVENOUS
  Administered 2021-03-25: 01:00:00 1350 [IU]/h via INTRAVENOUS
  Administered 2021-03-26: 1600 [IU]/h via INTRAVENOUS
  Administered 2021-03-27: 1700 [IU]/h via INTRAVENOUS
  Administered 2021-03-27: 1600 [IU]/h via INTRAVENOUS
  Administered 2021-03-28: 1700 [IU]/h via INTRAVENOUS
  Filled 2021-03-22 (×9): qty 250

## 2021-03-22 MED ORDER — METOPROLOL TARTRATE 5 MG/5ML IV SOLN
5.0000 mg | Freq: Once | INTRAVENOUS | Status: AC
  Administered 2021-03-22: 5 mg via INTRAVENOUS

## 2021-03-22 MED ORDER — FOLIC ACID 1 MG PO TABS
1.0000 mg | ORAL_TABLET | Freq: Every day | ORAL | Status: DC
  Administered 2021-03-22 – 2021-04-01 (×11): 1 mg via ORAL
  Filled 2021-03-22 (×10): qty 1

## 2021-03-22 MED ORDER — UMECLIDINIUM-VILANTEROL 62.5-25 MCG/ACT IN AEPB
1.0000 | INHALATION_SPRAY | Freq: Every day | RESPIRATORY_TRACT | Status: DC
  Filled 2021-03-22: qty 14

## 2021-03-22 MED ORDER — IPRATROPIUM BROMIDE 0.02 % IN SOLN
0.5000 mg | Freq: Four times a day (QID) | RESPIRATORY_TRACT | Status: DC
  Administered 2021-03-22 – 2021-03-25 (×14): 0.5 mg via RESPIRATORY_TRACT
  Filled 2021-03-22 (×14): qty 2.5

## 2021-03-22 MED ORDER — ALBUTEROL SULFATE (2.5 MG/3ML) 0.083% IN NEBU
3.0000 mL | INHALATION_SOLUTION | Freq: Four times a day (QID) | RESPIRATORY_TRACT | Status: DC | PRN
  Filled 2021-03-22: qty 3

## 2021-03-22 MED ORDER — HEPARIN BOLUS VIA INFUSION
3500.0000 [IU] | Freq: Once | INTRAVENOUS | Status: AC
  Administered 2021-03-22: 3500 [IU] via INTRAVENOUS
  Filled 2021-03-22: qty 3500

## 2021-03-22 MED ORDER — HEPARIN BOLUS VIA INFUSION
2000.0000 [IU] | Freq: Once | INTRAVENOUS | Status: AC
  Administered 2021-03-23: 2000 [IU] via INTRAVENOUS
  Filled 2021-03-22: qty 2000

## 2021-03-22 MED ORDER — AMIODARONE HCL IN DEXTROSE 360-4.14 MG/200ML-% IV SOLN
60.0000 mg/h | INTRAVENOUS | Status: DC
  Administered 2021-03-22 (×2): 60 mg/h via INTRAVENOUS
  Filled 2021-03-22: qty 200

## 2021-03-22 MED ORDER — AMIODARONE IV BOLUS ONLY 150 MG/100ML
150.0000 mg | Freq: Once | INTRAVENOUS | Status: AC
  Administered 2021-03-22: 150 mg via INTRAVENOUS
  Filled 2021-03-22: qty 100

## 2021-03-22 MED ORDER — AMIODARONE HCL IN DEXTROSE 360-4.14 MG/200ML-% IV SOLN
30.0000 mg/h | INTRAVENOUS | Status: DC
  Filled 2021-03-22: qty 200

## 2021-03-22 MED ORDER — ONDANSETRON HCL 4 MG PO TABS: 4.0000 mg | ORAL_TABLET | Freq: Four times a day (QID) | ORAL | Status: DC | PRN

## 2021-03-22 MED ORDER — MORPHINE SULFATE (PF) 2 MG/ML IV SOLN
2.0000 mg | INTRAVENOUS | Status: DC | PRN
  Administered 2021-03-23 – 2021-03-26 (×5): 2 mg via INTRAVENOUS
  Filled 2021-03-22 (×6): qty 1

## 2021-03-22 MED ORDER — LEVALBUTEROL HCL 0.63 MG/3ML IN NEBU
0.6300 mg | INHALATION_SOLUTION | Freq: Four times a day (QID) | RESPIRATORY_TRACT | Status: DC
  Administered 2021-03-22 – 2021-03-25 (×14): 0.63 mg via RESPIRATORY_TRACT
  Filled 2021-03-22 (×14): qty 3

## 2021-03-22 MED ORDER — GABAPENTIN 300 MG PO CAPS
600.0000 mg | ORAL_CAPSULE | Freq: Two times a day (BID) | ORAL | Status: DC
  Administered 2021-03-22: 600 mg via ORAL
  Filled 2021-03-22: qty 2

## 2021-03-22 MED ORDER — AMIODARONE LOAD VIA INFUSION
150.0000 mg | Freq: Once | INTRAVENOUS | Status: AC
  Administered 2021-03-22: 150 mg via INTRAVENOUS
  Filled 2021-03-22: qty 83.34

## 2021-03-22 MED ORDER — DILTIAZEM LOAD VIA INFUSION
10.0000 mg | Freq: Once | INTRAVENOUS | Status: AC
  Administered 2021-03-22: 10 mg via INTRAVENOUS
  Filled 2021-03-22: qty 10

## 2021-03-22 MED ORDER — ACETAMINOPHEN 325 MG PO TABS: 650.0000 mg | ORAL_TABLET | Freq: Four times a day (QID) | ORAL | Status: DC | PRN

## 2021-03-22 MED ORDER — FUROSEMIDE 10 MG/ML IJ SOLN
40.0000 mg | Freq: Two times a day (BID) | INTRAMUSCULAR | Status: DC
  Administered 2021-03-22: 40 mg via INTRAVENOUS
  Filled 2021-03-22: qty 4

## 2021-03-22 MED ORDER — GABAPENTIN 300 MG PO CAPS: 300.0000 mg | ORAL_CAPSULE | Freq: Every day | ORAL | Status: DC

## 2021-03-22 MED ORDER — VANCOMYCIN HCL 750 MG/150ML IV SOLN
750.0000 mg | INTRAVENOUS | Status: DC
  Administered 2021-03-22: 750 mg via INTRAVENOUS
  Filled 2021-03-22: qty 150

## 2021-03-22 NOTE — Progress Notes (Signed)
HR 180s. Rapid response and MD notified. EKG completed.  Era Bumpers, RN

## 2021-03-22 NOTE — Progress Notes (Signed)
ANTICOAGULATION CONSULT NOTE   Pharmacy Consult for heparin Indication: atrial fibrillation  No Known Allergies  Patient Measurements: Height: 5\' 2"  (157.5 cm) Weight: 61.2 kg (134 lb 14.7 oz) IBW/kg (Calculated) : 50.1 Heparin Dosing Weight: 60kg  Vital Signs: Temp: 97.7 F (36.5 C) (12/01 2000) Temp Source: Oral (12/01 2000) BP: 110/87 (12/01 2000) Pulse Rate: 151 (12/01 2000)  Labs: Recent Labs    03/21/21 1915 03/21/21 2118 03/22/21 0648 03/22/21 1431 03/22/21 1939 03/22/21 2259  HGB 12.1 10.9* 11.4* 12.8  --   --   HCT 33.8* 32.0* 31.6* 36.6  --   --   PLT 152  --  141* 153  --   --   APTT 31  --   --   --   --   --   LABPROT 14.3  --   --   --   --   --   INR 1.1  --   --   --   --   --   HEPARINUNFRC  --   --   --   --   --  <0.10*  CREATININE 1.86*  --  2.06* 2.11* 2.07*  --      Estimated Creatinine Clearance: 21.4 mL/min (A) (by C-G formula based on SCr of 2.07 mg/dL (H)).   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 Patient now with afib with rvr this afternoon. She was not on anticoagulation prior to admit and was receiving sq heparin for prophylaxis since admit. New orders to transition patient over to IV heparin. Hemoglobin has remained stable ~11 this admit, no bleeding issues have been noted.   12/1 PM update:  Heparin level low  Goal of Therapy:  Heparin level 0.3-0.7  units/ml Monitor platelets by anticoagulation protocol: Yes   Plan:  Heparin 2000 units re-[employer] heparin to 950 units/hr Heparin level with AM labs  Narda Bonds, PharmD, [employer] Pharmacist Phone: [phone]

## 2021-03-22 NOTE — Progress Notes (Signed)
  Echocardiogram 2D Echocardiogram has been performed.  Sheila Morales 03/22/2021, 10:27 AM

## 2021-03-22 NOTE — Significant Event (Signed)
Rapid Response Event Note   Reason for Call :  Rapid HR 200  Admit with hyponatremia, AMS  Initial Focused Assessment:  Patient is lying calmly in bed.  She is mildly drowsy and confused.  Per staff and husband this has been her neuro baseline since admission.  NIHSS 4 She is warm and dry Head ache 10/10 she also states she is very fatigued  Heart tones irregular Lung sounds with crackles in bases BP 116/91  AF 190  RR 22 O2 sat 95 on 2L [place] Temp 98.4 CBG 50  Patient has been NPO today   Interventions:  Zoll pads placed 1 amp D50 given IV 12 lead EKG done  Dr Alfredia Ferguson at bedside to assess patient  5mg  Lopressor IV Cardizem bolus 10mg  and gtt started at 5mg  BP 116/70  HR 140  RR 20  O2 sat 94 on 4L [place]  Orders received for lab draw  Increased Cardizem to 10mg /hr BP 106/67  HR 151  RR 20 O2 sat 99% on 4L    Plan of Care:  Cardiology consult Titrate Cardizem per orders   Event Summary:   MD Notified: Dr Alfredia Ferguson Call Time: [place] Time: 1312 End Time: [place]  Raliegh Ip, RN

## 2021-03-22 NOTE — Consult Note (Addendum)
Cardiology Consultation:   Patient ID: Sheila Morales MRN: [id number]; DOB: 03/22/1951  Admit date: 03/21/2021 Date of Consult: 03/22/2021  PCP:  Hoyt Koch, MD   [facility] Providers Cardiologist:  Elouise Munroe, MD   Patient Profile:   Sheila Morales is a 71 y.o. female with a hx of COPD, ischemic colitis, IBS, RLS, SLE on methotrexate, BPPV, HTN, chronic diastolic heart failure, and SSS s/p PPM in place  who is being seen 03/22/2021 for the evaluation of CHF exacerbation at the request of Dr. Alfredia Ferguson.  History of Present Illness:   Sheila Morales underwent biotronik dual chamber PPM for symptomatic bradycardia due to sinus node dysfunction 05/26/20. She was last seen by Dr. [name] 07/2020 and Dr. Lovena Le 08/2020 and was doing well.   The following history obtained from chart review. Pt husband reported that four days prior (03/15/21), she had weakness, fatigue, and dyspnea. She was given mucinex and sudafed. Pt presented to [facility] 03/19/21 with these symptoms and was tested for COVID. She had dyspnea, fatigue, weakness, and AMS with a nonproductive cough. Due to crackles on exam and tachypnea at rest, she was sent to the ER, but left without being seen due to long wait times. She called her PCP and pulmonologist to review ER CXR and labs, prescribed ABX for PNA, but not yet started.  She initially felt better, but was called given CXR results and Na and asked to come back to the ER. She presented to [facility] 03/21/21 and transferred to [facility].    BNP 1280 WBC 21 Na 113 sCr 1.38 --> 1.9 --> 2.06  Lactic 1.3 Ammonia 11  Echo obtained and LVEF hyperdynamic with grade 2 DD without significant valvular disease.   At approximately 1pm she converted to rapid Afib in the 170s. She was given 5 mg IV lopressor and started on cardizem gtt. Rates now in the 120-150s. During my exam, she is receiving a breathing treatment.   Nephology consulted for hyponatremia and recommended no further  diuresis.   ABX are continued for E. Coli bacteremia.   Pt appears mildly confused on exam. Husband at bedside helps with history.    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     Past Surgical History:  Procedure Laterality Date   CHOLECYSTECTOMY     CHOLECYSTECTOMY, LAPAROSCOPIC     COLONOSCOPY     ESOPHAGOGASTRODUODENOSCOPY     lesion, vulva excision     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   Procedure: PACEMAKER IMPLANT;  Surgeon: [name], MD;  Location: [facility];  Service: Cardiovascular;  Laterality: N/A;   TONSILLECTOMY     VIDEO BRONCHOSCOPY Bilateral 11/06/2017   Procedure: VIDEO BRONCHOSCOPY WITHOUT FLUORO;  Surgeon: Marshell Garfinkel, MD;  Location: [facility];  Service: Cardiopulmonary;  Laterality: Bilateral;     Home Medications:  Prior to Admission medications   Medication Sig Start Date End Date Taking?  Authorizing Provider  albuterol (VENTOLIN HFA) 108 (90 Base) MCG/ACT inhaler Inhale 1-2 puffs into the lungs every 6 (six) hours as needed for wheezing or shortness of breath. 10/13/20  Yes Byrum, Rose Fillers, MD  ANORO ELLIPTA 62.5-25 MCG/ACT AEPB INHALE 1 PUFF INTO THE LUNGS DAILY. Patient taking differently: Inhale 1 puff into the lungs daily. 02/26/21  Yes Collene Gobble, MD  aspirin 81 MG tablet Take 81 mg by mouth at bedtime.   Yes [provider]  benzonatate (TESSALON) 200 MG capsule Take 1 capsule (200 mg total) by mouth every 6 (six) hours as needed for  cough. 03/20/21  Yes Collene Gobble, MD  BLACK COHOSH PO Take 1 capsule by mouth daily.   Yes [provider]  calcium carbonate (OS-CAL - DOSED IN MG OF ELEMENTAL CALCIUM) 1250 (500 Ca) MG tablet Take 1 tablet by mouth daily as needed (heartburn).   Yes [provider]  Cholecalciferol (VITAMIN D) 50 MCG (2000 UT) tablet Take 2,000 Units by mouth daily.   Yes [provider]  folic acid (FOLVITE) 1 MG tablet Take 1 mg by mouth daily.   Yes [provider]  furosemide (LASIX) 40 MG tablet Take 1 tablet (40 mg total) by mouth daily. 03/21/21  Yes Martyn Ehrich, NP  gabapentin (NEURONTIN) 300 MG capsule TAKE TWO CAPSULES BY MOUTH EVERY MORNING, TWO CAPSULES EACH AFTERNOON AND THREE CAPSULES AT BEDTIME Patient taking differently: Take 900-1,200 mg by mouth See admin instructions. 900 mg in the morning, and 1200 mg at bedtime 02/27/21  Yes Hoyt Koch, MD  hydroxychloroquine (PLAQUENIL) 200 MG tablet Take 200 mg by mouth daily.   Yes [provider]  ibuprofen (ADVIL) 200 MG tablet Take 400 mg by mouth every 6 (six) hours as needed for headache or mild pain.   Yes [provider]  methotrexate 2.5 MG tablet Take 10 mg by mouth every Thursday.   Yes [provider]  Misc Natural Products (ADVANCED JOINT RELIEF) CAPS Take 1 capsule by mouth daily. Joint Advantage Gold 5x   Yes [provider]  Multiple Vitamin (MULTIVITAMIN WITH MINERALS) TABS tablet Take 1 tablet by mouth daily.   Yes [provider]  Omega-3 Fatty Acids (OMEGA 3 PO) Take 1 capsule by mouth daily.   Yes [provider]  OVER THE COUNTER MEDICATION Take 1 capsule by mouth daily. brain essentials otc supplement   Yes [provider]  RESTASIS 0.05 % ophthalmic emulsion Place 1 drop into both eyes 2 (two) times daily. 07/30/17  Yes [provider]  telmisartan (MICARDIS) 40 MG tablet TAKE TWO TABLETS BY MOUTH ONCE  DAILY Patient taking differently: Take 40 mg by mouth daily. 06/26/20  Yes Burns, Claudina Lick, MD  vitamin C (ASCORBIC ACID) 500 MG tablet Take 500 mg by mouth daily.   Yes [provider]  vitamin E 180 MG (400 UNITS) capsule Take 400 Units by mouth daily.   Yes [provider]  zolpidem (AMBIEN) 5 MG tablet Take 1 tablet (5 mg total) by mouth at bedtime as needed for sleep. 10/30/20  Yes Hoyt Koch, MD  doxycycline (VIBRA-TABS) 100 MG tablet Take 1 tablet (100 mg total) by mouth 2 (two) times daily. Patient not taking: Reported on 03/22/2021 03/21/21   Martyn Ehrich, NP    Inpatient Medications: Scheduled Meds:  aspirin EC  81 mg Oral Daily   folic acid  1 mg Oral Daily   ipratropium  0.5  mg Nebulization Q6H   levalbuterol  0.63 mg Nebulization Q6H   metoprolol tartrate       nicotine  14 mg Transdermal Once   vancomycin variable dose per unstable renal function (pharmacist dosing)   Does not apply See admin instructions   Continuous Infusions:  sodium chloride Stopped (03/22/21 0037)   ceFEPime (MAXIPIME) IV     diltiazem (CARDIZEM) infusion 10 mg/hr (03/22/21 1409)   heparin 800 Units/hr (03/22/21 1417)   vancomycin     PRN Meds: sodium chloride, acetaminophen **OR** acetaminophen, albuterol, benzonatate, morphine injection, nitroGLYCERIN, ondansetron **OR** ondansetron (ZOFRAN) IV, oxyCODONE  Allergies:   No Known Allergies  Social History:   Social History   Socioeconomic History   Marital status: Married    Spouse name: Not on file   Number of children: 2   Years of education: 12   Highest education level: Not on file  Occupational History   Occupation: Education administrator business   Occupation: caregiver  Tobacco Use   Smoking status: Every Day    Packs/day: 0.25    Years: 30.00    Pack years: 7.50    Types: Cigarettes    Last attempt to quit: 01/21/2020    Years since quitting: 1.1   Smokeless tobacco: Never   Tobacco comments:    smokes  "every now and then" 08/24/20 [facility]   Vaping Use   Vaping Use: Never used  Substance and Sexual Activity   Alcohol use: Yes    Alcohol/week: 5.0 standard drinks    Types: 5 Standard drinks or equivalent per week    Comment: 4 out of 7 dyas per husband at bedside   Drug use: No   Sexual activity: Yes  Other Topics Concern   Not on file  Social History Narrative   HSG. Married '71. 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       One level home with spouse   Right handed   Caffeine - coffee 2-3 cups am   Exercise - treadmill    Education - 12th grade   Social Determinants of Health   Financial Resource Strain: Low Risk    Difficulty of Paying Living Expenses: Not hard at all  Food Insecurity: No Food Insecurity   Worried About Charity fundraiser in the Last Year: Never true   Ran Out of Food in the Last Year: Never true  Transportation Needs: No Transportation Needs   Lack of Transportation (Medical): No   Lack of Transportation (Non-Medical): No  Physical Activity: Sufficiently Active   Days of Exercise per Week: 5 days   Minutes of Exercise per Session: 30 min  Stress: No Stress Concern Present   Feeling of Stress : Not at all  Social Connections: Moderately Integrated   Frequency of Communication with Friends and Family: More than three times a week   Frequency of Social Gatherings with Friends and Family: More than three times a week   Attends Religious Services: More than 4 times per year   Active Member of [employer] or Organizations: No   Attends Archivist Meetings: Never   Marital Status: Married  Human resources officer Violence: Not on file    Family History:    Family History  Problem Relation Age of Onset   Stroke Mother        Deceased, 69s   Atrial fibrillation Father    Colon cancer Father    Other  Sister        Pick's disease, deceased   Healthy Daughter      ROS:   Please see the history of present illness.   All other ROS reviewed and negative.     Physical Exam/Data:   Vitals:   03/22/21 1316 03/22/21 1332 03/22/21 1345 03/22/21 1400  BP: (!) 116/91 112/68 120/69 116/70  Pulse:  80 (!) 158 (!) 39  Resp: (!) 22 20 18 19   Temp:  98.4 F (36.9 C)    TempSrc:  Oral    SpO2:  93% 98% 94%  Weight:      Height:        Intake/Output Summary (Last 24 hours) at 03/22/2021 1423 Last data filed at 03/22/2021 0400 Gross per 24 hour  Intake 664.8 ml  Output --  Net 664.8 ml   Last 3 Weights 03/22/2021 03/21/2021 03/21/2021  Weight (lbs) 134 lb 14.7 oz 135 lb 9.6 oz 137 lb 12.8 oz  Weight (kg) 61.2 kg 61.508 kg 62.506 kg     Body mass index is 24.68 kg/m.  General:  female in NAD,  HEENT: normal Neck: no JVD Vascular: No carotid bruits; Distal pulses 2+ bilaterally Cardiac:  irregular rhythm, tachycardic rate Lungs:  clear to auscultation bilaterally, no wheezing, rhonchi or rales  Abd: soft, nontender, no hepatomegaly  Ext: no edema Musculoskeletal:  No deformities, BUE and BLE strength normal and equal Skin: warm and dry  Neuro:  CNs 2-12 intact, no focal abnormalities noted Psych:  Normal affect   EKG:  The EKG was personally reviewed and demonstrates:  atrial fibrillation with ventricular rate 163 Telemetry:  Telemetry was personally reviewed and demonstrates:  converted from sinus with some A-pacing to Afib RVR at approximately 1pm --> HR now 150s  Relevant CV Studies:  Echo 03/22/21: 1. Left ventricular ejection fraction, by estimation, is 70 to 75%. The  left ventricle has hyperdynamic function. The left ventricle has no  regional wall motion abnormalities. There is mild left ventricular  hypertrophy. Left ventricular diastolic  parameters are consistent with Grade II diastolic dysfunction  (pseudonormalization). Elevated left atrial pressure.   2. Right ventricular systolic function is normal. The right ventricular  size is  normal. There is mildly elevated pulmonary artery systolic  pressure.   3. Left atrial size was mildly dilated.   4. The mitral valve is normal in structure. Mild mitral valve  regurgitation. No evidence of mitral stenosis.   5. The aortic valve is tricuspid. Aortic valve regurgitation is not  visualized. Aortic valve sclerosis/calcification is present, without any  evidence of aortic stenosis.   6. The inferior vena cava is normal in size with greater than 50%  respiratory variability, suggesting right atrial pressure of 3 mmHg.    Laboratory Data:  High Sensitivity Troponin:  No results for input(s): TROPONINIHS in the last 720 hours.   Chemistry Recent Labs  Lab 03/19/21 1226 03/21/21 1206 03/21/21 1915 03/21/21 2118 03/22/21 0648  NA 121* 113* 116* 117* 119*  K 4.8 5.3* 4.8 4.9 4.2  CL 90* 84* 83*  --  89*  CO2 23 23 20*  --  19*  GLUCOSE 101* 77 80  --  68*  BUN 14 29* 29*  --  31*  CREATININE 1.38* 1.90* 1.86*  --  2.06*  CALCIUM 8.8* 9.0 9.0  --  8.5*  GFRNONAA 41*  --  29*  --  25*  ANIONGAP 8  --  13  --  11  Recent Labs  Lab 03/21/21 1915  PROT 7.3  ALBUMIN 3.6  AST 42*  ALT 26  ALKPHOS 115  BILITOT 0.9   Lipids No results for input(s): CHOL, TRIG, HDL, LABVLDL, LDLCALC, CHOLHDL in the last 168 hours.  Hematology Recent Labs  Lab 03/19/21 1226 03/21/21 1915 03/21/21 2118 03/22/21 0648  WBC 20.9* 24.8*  --  19.4*  RBC 3.65* 3.73*  --  3.52*  HGB 12.0 12.1 10.9* 11.4*  HCT 34.6* 33.8* 32.0* 31.6*  MCV 94.8 90.6  --  89.8  MCH 32.9 32.4  --  32.4  MCHC 34.7 35.8  --  36.1*  RDW 13.4 13.5  --  13.3  PLT 159 152  --  141*   Thyroid  Recent Labs  Lab 03/22/21 0648  TSH 0.909    BNP Recent Labs  Lab 03/19/21 1226 03/21/21 1206 03/21/21 1915 03/22/21 0648  BNP 1,149.3*  --  1,399.1* 1,498.1*  PROBNP  --  1,279.0*  --   --     DDimer No results for input(s): DDIMER in the last 168 hours.   Radiology/Studies:  DG Chest 2  View  Result Date: 03/19/2021 CLINICAL DATA:  Shortness of breath, cough, and fatigue for few days EXAM: CHEST - 2 VIEW COMPARISON:  05/26/2020 FINDINGS: LEFT subclavian sequential transvenous pacemaker leads project at RIGHT atrium and RIGHT ventricle. Enlargement of cardiac silhouette with pulmonary vascular congestion. Mild chronic accentuation of interstitial markings similar to prior exam. Minimal subsegmental atelectasis at lung bases. No definite acute infiltrate, pleural effusion, or pneumothorax. Minimal atherosclerotic calcification aorta. Bones demineralized. IMPRESSION: Enlargement of cardiac silhouette with pulmonary vascular congestion. Chronic accentuation of interstitial markings with minimal bibasilar atelectasis. Aortic Atherosclerosis (ICD10-I70.0). Electronically Signed   By: Lavonia Dana M.D.   On: 03/19/2021 13:04   CT Chest Wo Contrast  Result Date: 03/21/2021 CLINICAL DATA:  Respiratory illness EXAM: CT CHEST WITHOUT CONTRAST TECHNIQUE: Multidetector CT imaging of the chest was performed following the standard protocol without IV contrast. COMPARISON:  Chest x-ray 03/21/2021, chest CT 11/04/2017 FINDINGS: Cardiovascular: Limited evaluation without intravenous contrast. Partially visualized intracardiac pacing leads. Cardiomegaly with coronary vascular calcifications. No pericardial effusion. Moderate aortic atherosclerosis. No aneurysm Mediastinum/Nodes: Midline trachea. No thyroid mass. Multiple small left supraclavicular lymph nodes. Slightly enlarged precarinal lymph node measuring 13 mm. Esophagus within normal limits. Lungs/Pleura: Mild emphysema. Mild subpleural reticulation likely due to fibrosis. Bandlike density in the left lung base consistent with atelectasis or scar. No acute consolidation, pleural effusion or pneumothorax. Upper Abdomen: No acute abnormality. Musculoskeletal: No chest wall mass or suspicious bone lesions identified. IMPRESSION: 1. Negative for acute  airspace disease or pneumonia 2. Mild emphysema. Mild subpleural reticulation, consistent with mild degree of fibrosis. 3. Cardiomegaly 4. Mild mediastinal lymph nodes which are nonspecific and may be reactive. Aortic Atherosclerosis (ICD10-I70.0) and Emphysema (ICD10-J43.9). Electronically Signed   By: Donavan Foil M.D.   On: 03/21/2021 22:12   DG Chest Port 1 View  Result Date: 03/21/2021 CLINICAL DATA:  Cough and altered mental status. EXAM: PORTABLE CHEST 1 VIEW COMPARISON:  March 19, 2021 FINDINGS: There is a dual lead AICD. Diffuse, chronic appearing increased interstitial lung markings are noted. This is stable in appearance when compared to the prior exam. Mild, stable bibasilar atelectasis is noted. There is no evidence of a pleural effusion or pneumothorax. The heart size and mediastinal contours are within normal limits. The visualized skeletal structures are unremarkable. IMPRESSION: 1. Diffuse, chronic appearing increased interstitial lung markings, stable in appearance  when compared to the prior exam. 2. Mild, stable bibasilar atelectasis. Electronically Signed   By: Virgina Norfolk M.D.   On: 03/21/2021 19:27   ECHOCARDIOGRAM COMPLETE  Result Date: 03/22/2021    ECHOCARDIOGRAM REPORT   Patient Name:   WINDY DUDEK Date of Exam: 03/22/2021 Medical Rec #:  [id number]    Height:       62.0 in Accession #:    [id number]   Weight:       134.9 lb Date of Birth:  Jul 19, 1950    BSA:          1.617 m Patient Age:    73 years     BP:           117/60 mmHg Patient Gender: F            HR:           73 bpm. Exam Location:  Inpatient Procedure: 2D Echo, 3D Echo, Cardiac Doppler and Color Doppler Indications:    I50.40* Unspecified combined systolic (congestive) and diastolic                 (congestive) heart failure  History:        Patient has prior history of Echocardiogram examinations, most                 recent 03/09/2020. CHF, Abnormal ECG and Pacemaker, COPD,                  Arrythmias:Bradycardia; Risk Factors:Current Smoker and                 Hypertension.  Sonographer:    Roseanna Rainbow RDCS Referring Phys: [id number] ASIA B [place]  Sonographer Comments: Technically difficult study due to poor echo windows. IMPRESSIONS  1. Left ventricular ejection fraction, by estimation, is 70 to 75%. The left ventricle has hyperdynamic function. The left ventricle has no regional wall motion abnormalities. There is mild left ventricular hypertrophy. Left ventricular diastolic parameters are consistent with Grade II diastolic dysfunction (pseudonormalization). Elevated left atrial pressure.  2. Right ventricular systolic function is normal. The right ventricular size is normal. There is mildly elevated pulmonary artery systolic pressure.  3. Left atrial size was mildly dilated.  4. The mitral valve is normal in structure. Mild mitral valve regurgitation. No evidence of mitral stenosis.  5. The aortic valve is tricuspid. Aortic valve regurgitation is not visualized. Aortic valve sclerosis/calcification is present, without any evidence of aortic stenosis.  6. The inferior vena cava is normal in size with greater than 50% respiratory variability, suggesting right atrial pressure of 3 mmHg. Comparison(s): No significant change from prior study. FINDINGS  Left Ventricle: Left ventricular ejection fraction, by estimation, is 70 to 75%. The left ventricle has hyperdynamic function. The left ventricle has no regional wall motion abnormalities. The left ventricular internal cavity size was normal in size. There is mild left ventricular hypertrophy. Left ventricular diastolic parameters are consistent with Grade II diastolic dysfunction (pseudonormalization). Elevated left atrial pressure. Right Ventricle: The right ventricular size is normal. Right ventricular systolic function is normal. There is mildly elevated pulmonary artery systolic pressure. The tricuspid regurgitant velocity is 3.18 m/s, and with an  assumed right atrial pressure of 3 mmHg, the estimated right ventricular systolic pressure is 93.5 mmHg. Left Atrium: Left atrial size was mildly dilated. Right Atrium: Right atrial size was normal in size. Pericardium: There is no evidence of pericardial effusion. Mitral Valve: The mitral valve is normal in  structure. Mild mitral annular calcification. Mild mitral valve regurgitation. No evidence of mitral valve stenosis. MV peak gradient, 12.0 mmHg. The mean mitral valve gradient is 3.0 mmHg. Tricuspid Valve: The tricuspid valve is normal in structure. Tricuspid valve regurgitation is mild . No evidence of tricuspid stenosis. Aortic Valve: The aortic valve is tricuspid. Aortic valve regurgitation is not visualized. Aortic valve sclerosis/calcification is present, without any evidence of aortic stenosis. Pulmonic Valve: The pulmonic valve was normal in structure. Pulmonic valve regurgitation is trivial. No evidence of pulmonic stenosis. Aorta: The aortic root is normal in size and structure. Venous: The inferior vena cava is normal in size with greater than 50% respiratory variability, suggesting right atrial pressure of 3 mmHg. IAS/Shunts: No atrial level shunt detected by color flow Doppler. Additional Comments: A device lead is visualized.  LEFT VENTRICLE PLAX 2D LVIDd:         3.70 cm     Diastology LVIDs:         2.50 cm     LV e' medial:    4.95 cm/s LV PW:         1.70 cm     LV E/e' medial:  29.7 LV IVS:        1.20 cm     LV e' lateral:   7.95 cm/s LVOT diam:     2.00 cm     LV E/e' lateral: 18.5 LV SV:         83 LV SV Index:   51 LVOT Area:     3.14 cm                             3D Volume EF: LV Volumes (MOD)           3D EF:        79 % LV vol d, MOD A2C: 59.3 ml LV EDV:       97 ml LV vol d, MOD A4C: 30.4 ml LV ESV:       20 ml LV vol s, MOD A2C: 22.4 ml LV SV:        77 ml LV vol s, MOD A4C: 9.2 ml LV SV MOD A2C:     36.9 ml LV SV MOD A4C:     30.4 ml LV SV MOD BP:      30.5 ml RIGHT VENTRICLE              IVC RV S prime:     14.80 cm/s  IVC diam: 1.80 cm TAPSE (M-mode): 1.8 cm LEFT ATRIUM             Index        RIGHT ATRIUM           Index LA diam:        4.60 cm 2.84 cm/m   RA Area:     12.20 cm LA Vol (A2C):   60.9 ml 37.66 ml/m  RA Volume:   27.30 ml  16.88 ml/m LA Vol (A4C):   41.0 ml 25.35 ml/m LA Biplane Vol: 52.9 ml 32.71 ml/m  AORTIC VALVE             PULMONIC VALVE LVOT Vmax:   137.00 cm/s PR End Diast Vel: 1.81 msec LVOT Vmean:  86.200 cm/s LVOT VTI:    0.263 m  AORTA Ao Root diam: 3.30 cm Ao Asc diam:  3.10 cm MITRAL VALVE  TRICUSPID VALVE MV Area (PHT): 3.27 cm     TR Peak grad:   40.4 mmHg MV Area VTI:   1.79 cm     TR Vmax:        318.00 cm/s MV Peak grad:  12.0 mmHg MV Mean grad:  3.0 mmHg     SHUNTS MV Vmax:       1.73 m/s     Systemic VTI:  0.26 m MV Vmean:      78.4 cm/s    Systemic Diam: 2.00 cm MV Decel Time: 232 msec MV E velocity: 147.00 cm/s MV A velocity: 76.80 cm/s MV E/A ratio:  1.91 Kirk Ruths MD Electronically signed by Kirk Ruths MD Signature Date/Time: 03/22/2021/12:34:46 PM    Final      Assessment and Plan:   Acute on chronic diastolic heart failure Acute respiratory failure - elevated BNP, edema on CXR - required BiPAP, now weaned off - CT chest negative for PNA - scheduled xopenex/atrovent q 6 hr - diuresing on 40 mg IV lasix x 2 doses - lasix now on hold for severe hyponatremia - ARB on hold due to AKI - bicarb 19 - suspect sepsis is driving symptoms    Acute on chronic hyponatremia - nephrology on board - has improved from 113 to 119 - no further lasix - will defer diuresis to nephrology   E. Coli bacteremia - ABX per primary/pharmD   New onset atrial fibrillation with RVR Sick sinus syndrome Dual chamber PPM in place - she converted to Afib RVR this afternoon - given 5 mg IV lopressor x 1 --> cardizem gtt titrated to 10 mg/hr  - pressure is complicating rate control with CCB - may need to transition to  amiodarone bolus with gtt - will likely not convert until sepsis is resolved - will support rate for now, if this persists, may consider DCCV   Need for anticoagulation This patients CHA2DS2-VASc Score and unadjusted Ischemic Stroke Rate (% per year) is equal to 4.8 % stroke rate/year from a score of 4 (HTN, CHF, female, age) - heparin gtt started per pharmacy   Hypotension - avoiding IVF given hyponatremia - BP may limit rate control    Risk Assessment/Risk Scores:   New York Heart Association (NYHA) Functional Class NYHA Class IV  CHA2DS2-VASc Score = 4 This indicates a 4.8% annual risk of stroke. The patient's score is based upon: CHF History: 1 HTN History: 1 Diabetes History: 0 Stroke History: 0 Vascular Disease History: 0 Age Score: 1 Gender Score: 1      For questions or updates, please contact [facility] Please consult [email or web address] for contact info under    Signed, Ledora Bottcher, PA  03/22/2021 2:23 PM  Personally seen and examined. Agree with APP above with the following comments: Briefly 70 yo F who presents with hx of COPD, SSS s/p PPM, acute on chronic hyponatremia  who presented with mixed picture respiratory failure.  Diuresis limited in the setting of hyponatremia (thought to be hypovolemic hyponatremia, nephrology following).  Had rapid response for hypotension Patient is not fully able to contribute: she has to urinate (has PureWick in place) presently she perseverates about this; her husband and I have been re-directing  Exam notable for irregular tachycardia.  Decreased breath sounds but without engaging in deep breaths due to above.  Labs notable for NA 119; given risks associated with acute rise and fall of Na will defer diuresis plan to nephrology; presently does not appears to be  volume overloaded Personally reviewed relevant tests; on TTE imaging there is no evidence of vegetation on the device lead Would recommend  - a portion of her  tachycardia is mediated by her illness (new bacteremia) - unable to tolerate diltiazem IV due to hypotension - limited AV nodal options - we will switch diltiazem to amiodarone (IV Bolus and drip) - anticoagulation has been started - digoxin limited in the setting of new AKI  Rudean Haskell, MD [facility]  [address] [place], [place] [address] [phone]  3:27 PM

## 2021-03-22 NOTE — Progress Notes (Signed)
Care started prior to midnight in the emergency room and patient was admitted early this morning after midnight by Dr. Carlynn Purl and I am in current agreement with her assessment and plan.  Additional changes to the plan of care been made accordingly.  The patient is a 70 year old Caucasian female with a past medical history significant for but #2 history of hypoxia, ischemic colitis, history of COPD, irritable bowel syndrome, restless leg syndrome, lupus, varicose veins of both legs as well as other comorbidities who presented to the ED with a chief complaint of dyspnea, fatigue, weakness and altered mental status.  Patient was to somewhat confused and history is obtained from the patient's husband who is at bedside and states that about 4 days ago she started feeling weak, tired and short of breath.  She complained of dyspnea at the most and did have coughing spells but clear nonproductive sputum.  She was given some Mucinex and Sudafed which improved her symptoms but then she went to urgent care was tested for COVID and they noted that she had crackles and congestion in her lungs needed sent to the ED.  She left the ED due to a long wait times and started to feel better the day after ED visit and then she is supposed to follow-up with several doctors in eventually went to her pulmonologist.  The pulmonologist refused her chart and started on antibiotics and Lasix and then came back later and told her that her sodium was extremely low and that she needed to go to the ED.  She went to [facility] and stabilized and started on oxygen and antibiotics as well as Lasix.  Around 2 AM she was transferred to [facility] for further hospitalization.  Husband did note that she had coughing spells that she had overnight and he attributed to postnasal drip.  She did have a slight temperature of 100.0 at [facility] for which she was given acetaminophen for.  The husband reports the patient's had  decreased appetite for at least 5 years with weight loss and her mentation was still somewhat confused but her breathing is stabilized improved.  In the ED she is given IV vancomycin and cefepime as well as acetaminophen, Lasix, Ativan, Flagyl, Zofran and vancomycin.  She was noted to have leukocytosis 24.8 and had a CT scan of the chest which was negative for airspace disease.  Chest x-ray showed diffuse chronic appearing interstitial lung markings that were stable from prior.  His and she did have a hyponatremia of 116.  Her BNP was elevated on admission and lactic acid trended down.  She was admitted for work-up of her volume overload and acute hypoxic respiratory failure in the setting of likely CHF of unknown type.  Currently she is being admitted and treated for the following but not limited to:  Acute respiratory failure with hypoxia in the setting of acute diastolic CHF as well as community-acquired pneumonia -Had to be placed on BiPAP short-term and then was weaned off -SpO2: 96 % O2 Flow Rate (L/min): 2 L/min FiO2 (%): 30 % -CT of the chest done and showed "Negative for acute airspace disease or pneumonia  Mild emphysema. Mild subpleural reticulation, consistent with mild degree of fibrosis. Cardiomegaly. Mild mediastinal lymph nodes which are nonspecific and may be reactive." -Continuous pulse oximetry maintain O2 saturation greater 90% -Continue supplemental oxygen via nasal cannula and wean O2 as tolerated -Continue with diuresis and start Xopenex/Atrovent every 6 scheduled -We will  need an ambulatory home O2 screen prior to discharge as well as repeat chest x-ray in a.m.  Acute CHF Acute congestive heart failure exacerbation likely Diastolic Failure  -Major Criteria  Crackles on exam, pulm edema on chest xray -Minor criteria dyspnea on exertion -IV diuresis initiated with 40 mg IV Lasix, continue with 40 mg IV twice daily -Last Echo November 2021 which showed an ejection fraction  65-70% and grade 2 diastolic dysfunction, update echo -Daily weights, fluid restriction, strict Is and Os -Continued ARB but will hold given Renal Dysfunction  -Cardiology consulted for further evaluation and recommendations  Acute Metabolic Encephalopathy -Most likely secondary to electrolyte disturbance -Infection may also be contributing with possibility of pneumonia on imaging -Currently protecting airway, arousable -VBG does not show an explanation for altered mental status -Check TSH -Continue to monitor  Acute Hyponatremia -Over couple of days sodium has gone from 121 -> 113 -> 116 -> 117 -> 119 -In the setting of fluid overload,-hypervolemic hyponatremia -Continue Lasix -Trend with BMP now -Urine electrolytes already ordered -Continue to monitor and nephrology is consulted for further evaluation  AKI -Creatinine increased 0.7 -> 1.86; Now BUN/Cr is 31/2.06 -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 -Continue Lasix -Avoid further nephrotoxic medications, contrast dyes, hypotension and dehydration renally adjust medications -Nephrology has been consulted for further evaluation  Tobacco Use Disorder -Patient is to continue with her counseling -Nicotine patch ordered for when patient does have cravings  COPD -Continue albuterol and stop Anoro and start Xopenex and Atrovent  Community Acquired Pneumonia -With leukocytosis 24.8, hypoxic respiratory failure, findings on chest x-ray -Continue Vanco and cefepime that in the ED but will stop Vanc if MRSA PCR is Negative  -Continue to monitor  Hypertension -Continued irbesartan but will stop given her Renal Dysfunction  -Last BP was 110/50 -Continue to monitor blood pressures per protocol  Will continue monitor patient's clinical response to intervention and repeat blood work and imaging in the a.m. and follow-up on specialist recommendations as we have consulted cardiology and nephrology.

## 2021-03-22 NOTE — H&P (Signed)
TRH H&P    Patient Demographics:    Sheila Morales, is a 70 y.o. female  MRN: [id number]  DOB - 1951/02/07  Admit Date - 03/21/2021  Referring MD/NP/PA: Ch[place]  Outpatient Primary MD for the patient is Hoyt Koch, MD  Patient coming from: Home  Chief complaint- abnormal labs and altered mental status   HPI:    Sheila Morales  is a 70 y.o. female, with history of hypoxia, ischemic colitis, COPD, IBS, restless leg syndrome, lupus, varicose veins of both legs, and more presents the ER with a chief complaint of dyspnea, fatigue, weakness, and altered mental status.  Patient is still confused, and asked me to take history from husband.  Husband reports that its been 4 days since she started feeling weak, tired, short of breath.  She complained of dyspnea the most.  She did have coughing spells but they were nonproductive of sputum.  She tried some Mucinex and Sudafed which improved her symptoms.  She went to urgent care and was tested for COVID, but they noted crackles and congestion in the lungs on exam and sent her to the ER.  Due to the long ER times patient left without being seen.  She felt like she was starting to do better the day after her ER visit.  She was supposed to follow-up with several doctors appointments and went first to the pulmonologist.  This doctor reviewed the chart and started her antibiotics and Lasix.  They then called back later and said that her sodium came back dangerously low and that she need to go back to the ER.  She went to [place] where she was stabilized, started on oxygen, started on antibiotics, and started on Lasix.  About 2 AM she was able to be transferred to [facility].  Husband believes that the coughing spells that she has had overnight attributable to postnasal drip.  He does not know of any history of heart failure.  He reports the patient had a fever of 100.0  rectally at [facility] for which she was given Tylenol.  He also reports that she has had a decreased appetite for approximately 5 years with weight loss.  She denies any diarrhea or vomiting.  Her mental status is still confused and somnolent compared to her baseline.  She does smoke but is working on quitting.  She drinks about 4 glasses of wine per week.  She is not vaccinated for flu or COVID.  Patient is full code.  In the ED Temp 97.7-100, heart rate 64-81, respiratory rate 14-31, blood pressure 121/47, satting at 97% on 2 L nasal cannula Patient started on cefepime and vancomycin Patient given Tylenol, Lasix, Ativan, Flagyl, Zofran, vancomycin CBC shows a slight drop in hemoglobin from 12.1-10.9 Leukocytosis 24.8  AKI with a creatinine increased from 0.78-1.86  VBG shows PCO2 40.2, pH 7.347 BNP is 1400 Lactic acid downtrending 1.3-1.9 Patient does have a hyponatremia at 116 CT chest without contrast shows negative for acute airspace disease Chest x-ray shows diffuse chronic  appearing increased interstitial lung markings stable in appearance from prior exam EKG shows a heart rate of 76, sinus rhythm, QTC 436 with no significant ST changes COVID-negative Admission requested for work-up of CHF    Review of systems:    Full review of systems could not be obtained due to altered mental status    Past History of the following :    Past Medical History:  Diagnosis Date   Acute respiratory failure with hypoxia ([place]) 11/05/2017   Arthritis    Colitis, ischemic (HCC)    COPD (chronic obstructive pulmonary disease) (HCC)    External hemorrhoids    H/O: rheumatic fever    IBS (irritable bowel syndrome)    Personal history colonic adenoma 03/25/2008   06/2007 right sided adenoma and 2 right hyperplastic polyps     Restless leg syndrome    questionable   Systemic lupus erythematosus (HCC)    Tubular adenoma of colon    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        Past Surgical History:  Procedure Laterality Date   CHOLECYSTECTOMY     CHOLECYSTECTOMY, LAPAROSCOPIC     COLONOSCOPY     ESOPHAGOGASTRODUODENOSCOPY     lesion, vulva excision     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   Procedure: PACEMAKER IMPLANT;  Surgeon: [name], MD;  Location: [facility];  Service: Cardiovascular;  Laterality: N/A;   TONSILLECTOMY     VIDEO BRONCHOSCOPY Bilateral 11/06/2017   Procedure: VIDEO BRONCHOSCOPY WITHOUT FLUORO;  Surgeon: Marshell Garfinkel, MD;  Location: [facility];  Service: Cardiopulmonary;  Laterality: Bilateral;      Social History:      Social History   Tobacco Use   Smoking status: Every Day    Packs/day: 0.25    Years: 30.00    Pack years: 7.50    Types: Cigarettes    Last attempt to quit: 01/21/2020    Years since quitting: 1.1   Smokeless tobacco: Never   Tobacco comments:    smokes "every now and then" 08/24/20 [facility]   Substance Use Topics   Alcohol use: Yes    Alcohol/week: 5.0 standard drinks    Types: 5 Standard drinks or equivalent per week    Comment: 4 out of 7 dyas per husband at bedside       Family History :     Family History  Problem Relation Age of Onset   Stroke Mother        Deceased, 40s   Atrial fibrillation Father    Colon cancer Father    Other Sister        Pick's disease, deceased   Healthy Daughter       Home Medications:   Prior to Admission medications   Medication Sig Start Date End Date Taking? Authorizing Provider  albuterol (VENTOLIN HFA) 108 (90 Base) MCG/ACT inhaler Inhale 1-2 puffs into the lungs every 6 (six) hours as needed for wheezing or shortness of breath. 10/13/20   Byrum, Rose Fillers, MD  ANORO ELLIPTA 62.5-25 MCG/ACT AEPB INHALE 1 PUFF INTO THE LUNGS DAILY. 02/26/21   Collene Gobble, MD  aspirin 81 MG tablet Take 81 mg by mouth daily.    [provider]  benzonatate (TESSALON) 200 MG capsule Take 1 capsule (200 mg total) by mouth every 6 (six) hours as needed for  cough. 03/20/21   Collene Gobble, MD  BLACK COHOSH PO Take 1 capsule by mouth daily.    [provider]  Calcium Carb-Cholecalciferol (CALCIUM 600 + D PO) Take 1 tablet by mouth daily.    [provider]  calcium carbonate (OS-CAL - DOSED IN MG OF ELEMENTAL CALCIUM) 1250 (500 Ca) MG tablet Take 1 tablet by mouth daily as needed (heartburn).    [provider]  Cholecalciferol (VITAMIN D) 50 MCG (2000 UT) tablet Take 2,000 Units by mouth daily.    [provider]  dicyclomine (BENTYL) 10 MG capsule Take 1 capsule (10 mg total) by mouth 4 (four) times daily -  before meals and at bedtime. 09/27/19   Hoyt Koch, MD  doxycycline (VIBRA-TABS) 100 MG tablet Take 1 tablet (100 mg total) by mouth 2 (two) times daily. 03/21/21   Martyn Ehrich, NP  folic acid (FOLVITE) 1 MG tablet Take 1 mg by mouth daily.    [provider]  furosemide (LASIX) 40 MG tablet Take 1 tablet (40 mg total) by mouth daily. 03/21/21   Martyn Ehrich, NP  gabapentin (NEURONTIN) 300 MG capsule TAKE TWO CAPSULES BY MOUTH EVERY MORNING, TWO CAPSULES EACH AFTERNOON AND THREE CAPSULES AT BEDTIME 02/27/21   Hoyt Koch, MD  hydroxychloroquine (PLAQUENIL) 200 MG tablet Take 200 mg by mouth 2 (two) times daily.    [provider]  ibuprofen (ADVIL) 200 MG tablet Take 400 mg by mouth every 6 (six) hours as needed for headache or mild pain.    [provider]  methotrexate 2.5 MG tablet Take 10 mg by mouth every Thursday.    [provider]  Misc Natural Products (ADVANCED JOINT RELIEF) CAPS Take 1 capsule by mouth daily. Joint Advantage Gold 5x    [provider]  Multiple Vitamin (MULTIVITAMIN WITH MINERALS) TABS tablet Take 1 tablet by mouth daily.    [provider]  Omega-3 Fatty Acids (OMEGA 3 PO) Take 1 capsule by mouth daily.    [provider]  OVER THE COUNTER MEDICATION Take 1 capsule by mouth daily. brain  essentials otc supplement    [provider]  Polyethyl Glycol-Propyl Glycol (SYSTANE OP) Place 1 drop into both eyes 2 (two) times daily.    [provider]  RESTASIS 0.05 % ophthalmic emulsion Place 1 drop into both eyes 2 (two) times daily. 07/30/17   [provider]  saccharomyces boulardii (FLORASTOR) 250 MG capsule Take 250 mg by mouth 2 (two) times daily. Patient not taking: Reported on 03/21/2021    [provider]  telmisartan (MICARDIS) 40 MG tablet TAKE TWO TABLETS BY MOUTH ONCE DAILY 06/26/20   Binnie Rail, MD  vitamin C (ASCORBIC ACID) 500 MG tablet Take 500 mg by mouth daily.    [provider]  vitamin E 180 MG (400 UNITS) capsule Take 400 Units by mouth daily.    [provider]  zolpidem (AMBIEN) 5 MG tablet Take 1 tablet (5 mg total) by mouth at bedtime as needed for sleep. 10/30/20   Hoyt Koch, MD     Allergies:    No Known Allergies   Physical Exam:   Vitals  Blood pressure (!) 108/58, pulse 75, temperature 98.1 F (36.7 C), temperature source Oral, resp. rate 15, height 5\' 2"  (1.575 m), weight 61.2 kg, SpO2 97 %.   1.  General: Patient lying supine in bed,  no acute distress   2. Psychiatric: Somnolent and confused, cooperative with exam   3. Neurologic: Speech and language are normal, face is symmetric, moves all 4 extremities voluntarily,  at baseline without acute deficits on limited exam   4. HEENMT:  Head is atraumatic, normocephalic, pupils reactive to light, neck is supple, trachea is midline, mucous membranes are moist   5. Respiratory : Crackles in bilateral lung fields without wheezing, no cyanosis, no increase in work of breathing or accessory muscle use, 2 L nasal cannula in place   6. Cardiovascular : Heart rate normal, rhythm is regular, no murmurs, rubs or gallops, no peripheral edema, peripheral pulses palpated   7. Gastrointestinal:  Abdomen is soft, nondistended, nontender  to palpation bowel sounds active, no masses or organomegaly palpated   8. Skin:  Skin is warm, dry and intact without rashes, acute lesions, or ulcers on limited exam   9.Musculoskeletal:  No acute deformities or trauma, no asymmetry in tone, no peripheral edema, peripheral pulses palpated, no tenderness to palpation in the extremities     Data Review:    CBC Recent Labs  Lab 03/19/21 1226 03/21/21 1915 03/21/21 2118  WBC 20.9* 24.8*  --   HGB 12.0 12.1 10.9*  HCT 34.6* 33.8* 32.0*  PLT 159 152  --   MCV 94.8 90.6  --   MCH 32.9 32.4  --   MCHC 34.7 35.8  --   RDW 13.4 13.5  --   LYMPHSABS 0.8 0.4*  --   MONOABS 1.4* 1.9*  --   EOSABS 0.0 0.2  --   BASOSABS 0.0 0.1  --    ------------------------------------------------------------------------------------------------------------------  Results for orders placed or performed during the hospital encounter of 03/21/21 (from the past 48 hour(s))  CBG monitoring, ED     Status: None   Collection Time: 03/21/21  7:12 PM  Result Value Ref Range   Glucose-Capillary 82 70 - 99 mg/dL    Comment: Glucose reference range applies only to samples taken after fasting for at least 8 hours.  Comprehensive metabolic panel     Status: Abnormal   Collection Time: 03/21/21  7:15 PM  Result Value Ref Range   Sodium 116 (LL) 135 - 145 mmol/L    Comment: CRITICAL RESULT CALLED TO, READ BACK BY AND VERIFIED WITH: LISA ADKINS RN @2025  03/21/2021 OLSONM    Potassium 4.8 3.5 - 5.1 mmol/L   Chloride 83 (L) 98 - 111 mmol/L   CO2 20 (L) 22 - 32 mmol/L   Glucose, Bld 80 70 - 99 mg/dL    Comment: Glucose reference range applies only to samples taken after fasting for at least 8 hours.   BUN 29 (H) 8 - 23 mg/dL   Creatinine, Ser 1.86 (H) 0.44 - 1.00 mg/dL   Calcium 9.0 8.9 - 10.3 mg/dL   Total Protein 7.3 6.5 - 8.1 g/dL   Albumin 3.6 3.5 - 5.0 g/dL   AST 42 (H) 15 - 41 U/L   ALT 26 0 - 44 U/L   Alkaline Phosphatase 115 38 - 126 U/L   Total  Bilirubin 0.9 0.3 - 1.2 mg/dL   GFR, Estimated 29 (L) >60 mL/min    Comment: (NOTE) Calculated using the CKD-EPI Creatinine Equation (2021)    Anion gap 13 5 - 15    Comment: Performed at [facility], [address] [place], [place] [address]  CBC with Differential     Status: Abnormal   Collection Time: 03/21/21  7:15 PM  Result Value Ref Range   WBC 24.8 (H) 4.0 - 10.5 K/uL   RBC 3.73 (L) 3.87 - 5.11 MIL/uL   Hemoglobin 12.1  12.0 - 15.0 g/dL   HCT 33.8 (L) 36.0 - 46.0 %   MCV 90.6 80.0 - 100.0 fL   MCH 32.4 26.0 - 34.0 pg   MCHC 35.8 30.0 - 36.0 g/dL   RDW 13.5 11.5 - 15.5 %   Platelets 152 150 - 400 K/uL   nRBC 0.0 0.0 - 0.2 %   Neutrophils Relative % 89 %   Neutro Abs 21.9 (H) 1.7 - 7.7 K/uL   Lymphocytes Relative 1 %   Lymphs Abs 0.4 (L) 0.7 - 4.0 K/uL   Monocytes Relative 8 %   Monocytes Absolute 1.9 (H) 0.1 - 1.0 K/uL   Eosinophils Relative 1 %   Eosinophils Absolute 0.2 0.0 - 0.5 K/uL   Basophils Relative 0 %   Basophils Absolute 0.1 0.0 - 0.1 K/uL   Immature Granulocytes 1 %   Abs Immature Granulocytes 0.17 (H) 0.00 - 0.07 K/uL    Comment: Performed at [facility], [address] [place], [place] [address]  Brain natriuretic peptide     Status: Abnormal   Collection Time: 03/21/21  7:15 PM  Result Value Ref Range   B Natriuretic Peptide 1,399.1 (H) 0.0 - 100.0 pg/mL    Comment: Performed at [facility], [address] [place], [place] [address]  Resp Panel by RT-PCR (Flu A&B, Covid) Nasopharyngeal Swab     Status: None   Collection Time: 03/21/21  7:15 PM   Specimen: Nasopharyngeal Swab; Nasopharyngeal(NP) swabs in vial transport medium  Result Value Ref Range   SARS Coronavirus 2 by RT PCR NEGATIVE NEGATIVE    Comment: (NOTE) SARS-CoV-2 target nucleic acids are NOT DETECTED.  The SARS-CoV-2 RNA is generally detectable in upper respiratory specimens during the acute phase of infection. The lowest concentration of  SARS-CoV-2 viral copies this assay can detect is 138 copies/mL. A negative result does not preclude SARS-Cov-2 infection and should not be used as the sole basis for treatment or other patient management decisions. A negative result may occur with  improper specimen collection/handling, submission of specimen other than nasopharyngeal swab, presence of viral mutation(s) within the areas targeted by this assay, and inadequate number of viral copies(<138 copies/mL). A negative result must be combined with clinical observations, patient history, and epidemiological information. The expected result is Negative.  Fact Sheet for Patients:  EntrepreneurPulse.com.au  Fact Sheet for Healthcare Providers:  IncredibleEmployment.be  This test is no t yet approved or cleared by the [place] FDA and  has been authorized for detection and/or diagnosis of SARS-CoV-2 by FDA under an Emergency Use Authorization (EUA). This EUA will remain  in effect (meaning this test can be used) for the duration of the COVID-19 declaration under Section 564(b)(1) of the Act, 21 U.S.C.section 360bbb-3(b)(1), unless the authorization is terminated  or revoked sooner.       Influenza A by PCR NEGATIVE NEGATIVE   Influenza B by PCR NEGATIVE NEGATIVE    Comment: (NOTE) The Xpert Xpress SARS-CoV-2/FLU/RSV plus assay is intended as an aid in the diagnosis of influenza from Nasopharyngeal swab specimens and should not be used as a sole basis for treatment. Nasal washings and aspirates are unacceptable for Xpert Xpress SARS-CoV-2/FLU/RSV testing.  Fact Sheet for Patients: EntrepreneurPulse.com.au  Fact Sheet for Healthcare Providers: IncredibleEmployment.be  This test is not yet approved or cleared by the [place] FDA and has been authorized for detection and/or diagnosis of SARS-CoV-2 by FDA under an Emergency Use Authorization (EUA).  This  EUA will remain in effect (meaning this test can be used) for the duration of the COVID-19 declaration under Section 564(b)(1) of the Act, 21 U.S.C. section 360bbb-3(b)(1), unless the authorization is terminated or revoked.  Performed at [facility], [address] [place], [place] [address]   Lactic acid, plasma     Status: None   Collection Time: 03/21/21  7:15 PM  Result Value Ref Range   Lactic Acid, Venous 1.3 0.5 - 1.9 mmol/L    Comment: Performed at [facility], [address] [place], [place] [address]  Protime-INR     Status: None   Collection Time: 03/21/21  7:15 PM  Result Value Ref Range   Prothrombin Time 14.3 11.4 - 15.2 seconds   INR 1.1 0.8 - 1.2    Comment: (NOTE) INR goal varies based on device and disease states. Performed at [facility], [address] [place], [place] [address]   APTT     Status: None   Collection Time: 03/21/21  7:15 PM  Result Value Ref Range   aPTT 31 24 - 36 seconds    Comment: Performed at [facility], [address] [place], [place] [address]  Ammonia     Status: None   Collection Time: 03/21/21  7:15 PM  Result Value Ref Range   Ammonia 11 9 - 35 umol/L    Comment: Performed at [facility], [address] [place], [place] [address]  Lactic acid, plasma     Status: None   Collection Time: 03/21/21  9:10 PM  Result Value Ref Range   Lactic Acid, Venous 0.9 0.5 - 1.9 mmol/L    Comment: Performed at [facility], [address] [place], [place] [address]  I-Stat venous blood gas, ED     Status: Abnormal   Collection Time: 03/21/21  9:18 PM  Result Value Ref Range   pH, Ven 7.347 7.250 - 7.430   pCO2, Ven 40.2 (L) 44.0 - 60.0 mmHg   pO2, Ven 39.0 32.0 - 45.0 mmHg   Bicarbonate 21.9 20.0 - 28.0 mmol/L   TCO2 23 22 - 32 mmol/L   O2 Saturation 69.0 %   Acid-base deficit 3.0 (H) 0.0 - 2.0 mmol/L   Sodium 117 (LL) 135 - 145 mmol/L   Potassium 4.9 3.5 -  5.1 mmol/L   Calcium, Ion 1.17 1.15 - 1.40 mmol/L   HCT 32.0 (L) 36.0 - 46.0 %   Hemoglobin 10.9 (L) 12.0 - 15.0 g/dL   Patient temperature 100.0 F    Sample type VENOUS    Comment NOTIFIED PHYSICIAN   Blood gas, venous     Status: Abnormal (Preliminary result)   Collection Time: 03/22/21  4:04 AM  Result Value Ref Range   FIO2 PENDING    pH, Ven 7.367 7.250 - 7.430   pCO2, Ven 36.8 (L) 44.0 - 60.0 mmHg   pO2, Ven PENDING 32.0 - 45.0 mmHg   Bicarbonate 20.6 20.0 - 28.0 mmol/L   Acid-base deficit 3.9 (H) 0.0 - 2.0 mmol/L   O2 Saturation 89.0 %   Patient temperature 37.0    Collection site VENOUS    Drawn by 2703    Sample type VENOUS     Comment: Performed at [facility], [address] [place], [place] [address]    Chemistries  Recent Labs  Lab 03/19/21 1226 03/21/21 1915 03/21/21 2118  NA  121* 116* 117*  K 4.8 4.8 4.9  CL 90* 83*  --   CO2 23 20*  --   GLUCOSE 101* 80  --   BUN 14 29*  --   CREATININE 1.38* 1.86*  --   CALCIUM 8.8* 9.0  --   AST  --  42*  --   ALT  --  26  --   ALKPHOS  --  115  --   BILITOT  --  0.9  --    ------------------------------------------------------------------------------------------------------------------  ------------------------------------------------------------------------------------------------------------------ GFR: Estimated Creatinine Clearance: 23.9 mL/min (A) (by C-G formula based on SCr of 1.86 mg/dL (H)). Liver Function Tests: Recent Labs  Lab 03/21/21 1915  AST 42*  ALT 26  ALKPHOS 115  BILITOT 0.9  PROT 7.3  ALBUMIN 3.6   No results for input(s): LIPASE, AMYLASE in the last 168 hours. Recent Labs  Lab 03/21/21 1915  AMMONIA 11   Coagulation Profile: Recent Labs  Lab 03/21/21 1915  INR 1.1   Cardiac Enzymes: No results for input(s): CKTOTAL, CKMB, CKMBINDEX, TROPONINI in the last 168 hours. BNP (last 3 results) No results for input(s): PROBNP in the last 8760 hours. HbA1C: No results  for input(s): HGBA1C in the last 72 hours. CBG: Recent Labs  Lab 03/21/21 1912  GLUCAP 82   Lipid Profile: No results for input(s): CHOL, HDL, LDLCALC, TRIG, CHOLHDL, LDLDIRECT in the last 72 hours. Thyroid Function Tests: No results for input(s): TSH, T4TOTAL, FREET4, T3FREE, THYROIDAB in the last 72 hours. Anemia Panel: No results for input(s): VITAMINB12, FOLATE, FERRITIN, TIBC, IRON, RETICCTPCT in the last 72 hours.  --------------------------------------------------------------------------------------------------------------- Urine analysis:    Component Value Date/Time   COLORURINE AMBER (A) 11/01/2017 1445   APPEARANCEUR CLOUDY (A) 11/01/2017 1445   LABSPEC 1.011 11/01/2017 1445   PHURINE 6.0 11/01/2017 1445   GLUCOSEU NEGATIVE 11/01/2017 1445   HGBUR NEGATIVE 11/01/2017 1445   BILIRUBINUR NEGATIVE 11/01/2017 1445   KETONESUR NEGATIVE 11/01/2017 1445   PROTEINUR 100 (A) 11/01/2017 1445   NITRITE NEGATIVE 11/01/2017 1445   LEUKOCYTESUR LARGE (A) 11/01/2017 1445      Imaging Results:    CT Chest Wo Contrast  Result Date: 03/21/2021 CLINICAL DATA:  Respiratory illness EXAM: CT CHEST WITHOUT CONTRAST TECHNIQUE: Multidetector CT imaging of the chest was performed following the standard protocol without IV contrast. COMPARISON:  Chest x-ray 03/21/2021, chest CT 11/04/2017 FINDINGS: Cardiovascular: Limited evaluation without intravenous contrast. Partially visualized intracardiac pacing leads. Cardiomegaly with coronary vascular calcifications. No pericardial effusion. Moderate aortic atherosclerosis. No aneurysm Mediastinum/Nodes: Midline trachea. No thyroid mass. Multiple small left supraclavicular lymph nodes. Slightly enlarged precarinal lymph node measuring 13 mm. Esophagus within normal limits. Lungs/Pleura: Mild emphysema. Mild subpleural reticulation likely due to fibrosis. Bandlike density in the left lung base consistent with atelectasis or scar. No acute consolidation,  pleural effusion or pneumothorax. Upper Abdomen: No acute abnormality. Musculoskeletal: No chest wall mass or suspicious bone lesions identified. IMPRESSION: 1. Negative for acute airspace disease or pneumonia 2. Mild emphysema. Mild subpleural reticulation, consistent with mild degree of fibrosis. 3. Cardiomegaly 4. Mild mediastinal lymph nodes which are nonspecific and may be reactive. Aortic Atherosclerosis (ICD10-I70.0) and Emphysema (ICD10-J43.9). Electronically Signed   By: Donavan Foil M.D.   On: 03/21/2021 22:12   DG Chest Port 1 View  Result Date: 03/21/2021 CLINICAL DATA:  Cough and altered mental status. EXAM: PORTABLE CHEST 1 VIEW COMPARISON:  March 19, 2021 FINDINGS: There is a dual lead AICD. Diffuse, chronic appearing increased interstitial lung markings  are noted. This is stable in appearance when compared to the prior exam. Mild, stable bibasilar atelectasis is noted. There is no evidence of a pleural effusion or pneumothorax. The heart size and mediastinal contours are within normal limits. The visualized skeletal structures are unremarkable. IMPRESSION: 1. Diffuse, chronic appearing increased interstitial lung markings, stable in appearance when compared to the prior exam. 2. Mild, stable bibasilar atelectasis. Electronically Signed   By: Virgina Norfolk M.D.   On: 03/21/2021 19:27       Assessment & Plan:    Principal Problem:   Acute CHF (congestive heart failure) (HCC) Active Problems:   Tobacco use   COPD (chronic obstructive pulmonary disease) (HCC)   Hyponatremia   AKI (acute kidney injury) ([place])   Hypertension   Pulmonary vascular congestion   Acute metabolic encephalopathy   Acute CHF Acute congestive heart failure exacerbation Major Criteria  Crackles on exam, pulm edema on chest xray Minor criteria dyspnea on exertion IV diuresis initiated with 40 mg IV Lasix, continue with 40 mg IV twice daily Last Echo November 2021 which showed an ejection fraction  65-70% and grade 2 diastolic dysfunction, update echo Daily weights, fluid restriction, strict Is and Os Continue ARB  Acute metabolic encephalopathy Most likely secondary to electrolyte disturbance Infection may also be contributing with possibility of pneumonia on imaging Currently protecting airway, arousable VBG does not show an explanation for altered mental status Check TSH Continue to monitor Acute hyponatremia Over couple of days sodium has gone from 121, 116 In the setting of fluid overload,-hypervolemic hyponatremia Continue Lasix Trend with BMP now Urine electrolytes already ordered Continue to monitor AKI Creatinine increased 0.7-1.86 Cardiorenal syndrome Continue Lasix Trend with morning BMP Tobacco use disorder Patient is to continue with her counseling Nicotine patch ordered for when patient does have cravings COPD Continue albuterol and Anoro Community acquired pneumonia With leukocytosis 24.8, hypoxic respiratory failure, findings on chest x-ray Continue Vanco and cefepime that were started prior to admission Continue to monitor Hypertension Continue irbesartan   DVT Prophylaxis-   Heparin - SCDs   AM Labs Ordered, also please review Full Orders  Family Communication: Admission, patients condition and plan of care including tests being ordered have been discussed with the patient and husband who indicate understanding and agree with the plan and Code Status.  Code Status: Full  Admission status: Inpatient :The appropriate admission status for this patient is INPATIENT. Inpatient status is judged to be reasonable and necessary in order to provide the required intensity of service to ensure the patient's safety. The patient's presenting symptoms, physical exam findings, and initial radiographic and laboratory data in the context of their chronic comorbidities is felt to place them at high risk for further clinical deterioration. Furthermore, it is not  anticipated that the patient will be medically stable for discharge from the hospital within 2 midnights of admission. The following factors support the admission status of inpatient.     The patient's presenting symptoms include dyspnea and abnormal labs. The worrisome physical exam findings include crackles. The initial radiographic and laboratory data are worrisome because of diffuse chronic appearing increased interstitial lung markings.,  Leukocytosis The chronic co-morbidities include tobacco use disorder, hypertension.       * I certify that at the point of admission it is my clinical judgment that the patient will require inpatient hospital care spanning beyond 2 midnights from the point of admission due to high intensity of service, high risk for further deterioration and high frequency  of surveillance required.*  Disposition: Anticipated Discharge date 72 hours Discharge to home  Time spent in minutes : [place] DO

## 2021-03-22 NOTE — Progress Notes (Signed)
ANTICOAGULATION CONSULT NOTE - Initial Consult  Pharmacy Consult for heparin Indication: atrial fibrillation  No Known Allergies  Patient Measurements: Height: 5\' 2"  (157.5 cm) Weight: 61.2 kg (134 lb 14.7 oz) IBW/kg (Calculated) : 50.1 Heparin Dosing Weight: 60kg  Vital Signs: Temp: 98.4 F (36.9 C) (12/01 1332) Temp Source: Oral (12/01 1332) BP: 116/70 (12/01 1400) Pulse Rate: 39 (12/01 1400)  Labs: Recent Labs    03/21/21 1206 03/21/21 1915 03/21/21 1915 03/21/21 2118 03/22/21 0648  HGB  --  12.1   < > 10.9* 11.4*  HCT  --  33.8*  --  32.0* 31.6*  PLT  --  152  --   --  141*  APTT  --  31  --   --   --   LABPROT  --  14.3  --   --   --   INR  --  1.1  --   --   --   CREATININE 1.90* 1.86*  --   --  2.06*   < > = values in this interval not displayed.    Estimated Creatinine Clearance: 21.6 mL/min (A) (by C-G formula based on SCr of 2.06 mg/dL (H)).   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 Patient now with afib with rvr this afternoon. She was not on anticoagulation prior to admit and was receiving sq heparin for prophylaxis since admit. New orders to transition patient over to IV heparin. Hemoglobin has remained stable ~11 this admit, no bleeding issues have been noted.    Goal of Therapy:  Heparin level 0.3-0.7 units/ml Monitor platelets by anticoagulation protocol: Yes   Plan:   Give 3500 units bolus x 1 Start heparin infusion at 800 units/hr Check anti-Xa level in 8 hours and daily while on heparin Continue to monitor H&H and platelets  Erin Hearing PharmD., BCPS Clinical Pharmacist 03/22/2021 2:23 PM

## 2021-03-22 NOTE — Progress Notes (Signed)
PHARMACY - PHYSICIAN COMMUNICATION CRITICAL VALUE ALERT - BLOOD CULTURE IDENTIFICATION (BCID)  Sheila Morales is an 70 y.o. female who presented to [facility] on 03/21/2021 with a chief complaint of altered mental status and dyspnea, pneumonia  Assessment:  E.coli isolated in 4 of 4 blood culture bottles.    Name of physician (or Provider) Contacted: Dr. Alfredia Ferguson  Current antibiotics: Cefepime and vancomycin  Changes to prescribed antibiotics recommended: change to ceftriaxone monotherapy   Results for orders placed or performed during the hospital encounter of 03/21/21  Blood Culture ID Panel (Reflexed) (Collected: 03/21/2021  7:15 PM)  Result Value Ref Range   Enterococcus faecalis NOT DETECTED NOT DETECTED   Enterococcus Faecium NOT DETECTED NOT DETECTED   Listeria monocytogenes NOT DETECTED NOT DETECTED   Staphylococcus species NOT DETECTED NOT DETECTED   Staphylococcus aureus (BCID) NOT DETECTED NOT DETECTED   Staphylococcus epidermidis NOT DETECTED NOT DETECTED   Staphylococcus lugdunensis NOT DETECTED NOT DETECTED   Streptococcus species NOT DETECTED NOT DETECTED   Streptococcus agalactiae NOT DETECTED NOT DETECTED   Streptococcus pneumoniae NOT DETECTED NOT DETECTED   Streptococcus pyogenes NOT DETECTED NOT DETECTED   A.calcoaceticus-baumannii NOT DETECTED NOT DETECTED   Bacteroides fragilis NOT DETECTED NOT DETECTED   Enterobacterales DETECTED (A) NOT DETECTED   Enterobacter cloacae complex NOT DETECTED NOT DETECTED   Escherichia coli DETECTED (A) NOT DETECTED   Klebsiella aerogenes NOT DETECTED NOT DETECTED   Klebsiella oxytoca NOT DETECTED NOT DETECTED   Klebsiella pneumoniae NOT DETECTED NOT DETECTED   Proteus species NOT DETECTED NOT DETECTED   Salmonella species NOT DETECTED NOT DETECTED   Serratia marcescens NOT DETECTED NOT DETECTED   Haemophilus influenzae NOT DETECTED NOT DETECTED   Neisseria meningitidis NOT DETECTED NOT DETECTED   Pseudomonas aeruginosa NOT  DETECTED NOT DETECTED   Stenotrophomonas maltophilia NOT DETECTED NOT DETECTED   Candida albicans NOT DETECTED NOT DETECTED   Candida auris NOT DETECTED NOT DETECTED   Candida glabrata NOT DETECTED NOT DETECTED   Candida krusei NOT DETECTED NOT DETECTED   Candida parapsilosis NOT DETECTED NOT DETECTED   Candida tropicalis NOT DETECTED NOT DETECTED   Cryptococcus neoformans/gattii NOT DETECTED NOT DETECTED   CTX-M ESBL NOT DETECTED NOT DETECTED   Carbapenem resistance IMP NOT DETECTED NOT DETECTED   Carbapenem resistance KPC NOT DETECTED NOT DETECTED   Carbapenem resistance NDM NOT DETECTED NOT DETECTED   Carbapenem resist OXA 48 LIKE NOT DETECTED NOT DETECTED   Carbapenem resistance VIM NOT DETECTED NOT DETECTED    [name] 03/22/2021  3:29 PM

## 2021-03-22 NOTE — Consult Note (Signed)
Nephrology Consult   Requesting provider: [facility] Service requesting consult: Hospitalist Reason for consult: AKI, Hyponatremia   Assessment/Recommendations: Sheila Morales is a/an 70 y.o. female with a past medical history ischemic colitis, copd, ibs, SLE? who present w/ AKI and hyponatremia   Acute on possible chronic hyponatremia: Sodium 121 on 11/28 and decreased further to 113.  To decrease to 113 could be considered acute given it was within a 48-hour window.  Unclear how long the sodium had been at 121 so this component is considered chronic. She has intermittently had low sodium in the past. She does not appear volume overloaded right now but sounds like she was on admission. I do think SIADH likely has played a role with recent/ongoing URI - Recheck BMP; monitor every 6 hours for now - Goal sodium is 125-128 by 12/2 at 7 am (given a full rise to 121 is permissible given the documented acute change) - Hold further lasix - Limit PO fluids - F/u urine studies   Non-Oliguric AKI: Likely multifactorial with hypotension, NSAID use, ARB use all contributing to some degree of ATN -Stop ARB -Consider supportive measures for blood pressure if hypotension worsens -Hold further Lasix for now -Continue to monitor daily Cr, Dose meds for GFR -Monitor Daily I/Os, Daily weight  -Maintain MAP>65 for optimal renal perfusion.  -Avoid nephrotoxic medications including NSAIDs and Vanc/Zosyn combo -Check Renal U/S to rule out obstruction -Currently no indication for HD  URI/PNA?: URI symptoms. Being treated with broad spectrum abx. CT was negative for pneumonia. She does have a significant leukocytosis. Defer mgmt and work up to primary  AMS: likely multifactorial but hyponatremia and sepsis a big driver. Gabapentin is a sedating medication. I will stop this  Metabolic acidosis: Bicarb 19 today.  Continue to monitor and supplement if needed.  Recommendations conveyed to primary service.     [facility] 03/22/2021 12:58 PM   _____________________________________________________________________________________ CC: AMS, hypoxia  History of Present Illness: Sheila Morales is a/an 70 y.o. female with a past medical history of ischemic colitis, copd, ibs, SLE?  who presents with altered mental status, dyspnea, fatigue.  The patient was unable to give history so history was obtained per chart review and from the husband.  The patient was feeling well until 11/26 when she started to experience nasal congestion.  Symptoms worsened throughout the rest of the weekend with more cough and congestion.  She tried over-the-counter medications with minimal improvement.  She was taking some NSAIDs per patient.  Not drinking excessive water per the husband.  P.o. intake did trail off as the symptoms progressed.  He started to note that she was more confused Monday evening.  They went to urgent care and the emergency department on 11/28 because of the symptoms she was experiencing.  The left the ER early because of the long wait time.  Labs at that time demonstrated creatinine 1.3 and sodium of 121.  She went to see her pulmonologist on 11/30 and labs were repeated.  Sodium had decreased further to 113 and creatinine was 1.9.  Her BNP was also elevated around 1400.  She was sent back to the emergency department.  On arrival to [facility] from clinic her sodium had improved to 116 and her creatinine was stable at 1.9.  This morning her creatinine had increased slightly to 2.1 and sodium had improved to 119.  The patient has continued to make urine but it has been difficult to collect because  her pure wick keeps falling off.  Patient continues to be confused but breathing is much improved.  She did receive IV Lasix 40 mg x 1 dose.  Her home ARB was continued on admission and she received a dose this morning.  Also has been continued on fairly high dose of gabapentin.  Does  have a history of renal failure related to dehydration but no other episodes of renal failure that the husband is aware of.  Urine output has been adequate at home.  Denies any hematuria.  No obvious fevers.  No nausea, vomiting, diarrhea.   Medications:  Current Facility-Administered Medications  Medication Dose Route Frequency Provider Last Rate Last Admin   0.9 %  sodium chloride infusion   Intravenous PRN Zierle-Ghosh, Asia B, DO   Stopped at 03/22/21 0037   acetaminophen (TYLENOL) tablet 650 mg  650 mg Oral Q6H PRN Zierle-Ghosh, Asia B, DO       Or   acetaminophen (TYLENOL) suppository 650 mg  650 mg Rectal Q6H PRN Zierle-Ghosh, Asia B, DO       albuterol (PROVENTIL) (2.5 MG/3ML) 0.083% nebulizer solution 3 mL  3 mL Inhalation Q6H PRN Zierle-Ghosh, Asia B, DO       aspirin EC tablet 81 mg  81 mg Oral Daily Zierle-Ghosh, Asia B, DO   81 mg at 03/22/21 2542   benzonatate (TESSALON) capsule 200 mg  200 mg Oral Q6H PRN Zierle-Ghosh, Asia B, DO       ceFEPIme (MAXIPIME) 2 g in sodium chloride 0.9 % 100 mL IVPB  2 g Intravenous Q24H Zierle-Ghosh, Asia B, DO       folic acid (FOLVITE) tablet 1 mg  1 mg Oral Daily Zierle-Ghosh, Asia B, DO   1 mg at 03/22/21 7062   furosemide (LASIX) injection 40 mg  40 mg Intravenous Q12H Zierle-Ghosh, Asia B, DO   40 mg at 03/22/21 3762   gabapentin (NEURONTIN) capsule 600 mg  600 mg Oral BID Raiford Noble [place], DO   600 mg at 03/22/21 8315   gabapentin (NEURONTIN) capsule 900 mg  900 mg Oral QHS Sheikh, Omair Latif, DO       heparin injection 5,000 Units  5,000 Units Subcutaneous Q8H Zierle-Ghosh, Asia B, DO   5,000 Units at 03/22/21 1761   morphine 2 MG/ML injection 2 mg  2 mg Intravenous Q2H PRN Zierle-Ghosh, Asia B, DO       nicotine (NICODERM CQ - dosed in mg/24 hours) patch 14 mg  14 mg Transdermal Once Zierle-Ghosh, Asia B, DO       nitroGLYCERIN (NITROSTAT) SL tablet 0.4 mg  0.4 mg Sublingual Q5 min PRN Zierle-Ghosh, Asia B, DO   0.4 mg at 03/21/21 1943    ondansetron (ZOFRAN) tablet 4 mg  4 mg Oral Q6H PRN Zierle-Ghosh, Asia B, DO       Or   ondansetron (ZOFRAN) injection 4 mg  4 mg Intravenous Q6H PRN Zierle-Ghosh, Asia B, DO       oxyCODONE (Oxy IR/ROXICODONE) immediate release tablet 5 mg  5 mg Oral Q4H PRN Zierle-Ghosh, Asia B, DO       umeclidinium-vilanterol (ANORO ELLIPTA) 62.5-25 MCG/ACT 1 puff  1 puff Inhalation Daily Zierle-Ghosh, Asia B, DO       vancomycin variable dose per unstable renal function (pharmacist dosing)   Does not apply See admin instructions Zierle-Ghosh, Asia B, DO         ALLERGIES Patient has no known allergies.  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      SOCIAL HISTORY Social History   Socioeconomic History   Marital status: Married    Spouse name: Not on file   Number of children: 2   Years of education: 12   Highest education level: Not on file  Occupational History   Occupation: Education administrator business   Occupation: caregiver  Tobacco Use   Smoking status: Every Day    Packs/day: 0.25    Years: 30.00    Pack years: 7.50    Types: Cigarettes    Last attempt to quit: 01/21/2020    Years since quitting: 1.1   Smokeless tobacco: Never   Tobacco comments:    smokes "every now and then" 08/24/20 [facility]J   Vaping Use   Vaping Use: Never used  Substance and Sexual Activity   Alcohol use: Yes    Alcohol/week: 5.0 standard drinks    Types: 5 Standard drinks or equivalent per week    Comment: 4 out of  7 dyas per husband at bedside   Drug use: No   Sexual activity: Yes  Other Topics Concern   Not on file  Social History Narrative   HSG. Married '71. 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       One level home with spouse   Right handed   Caffeine - coffee 2-3 cups am   Exercise - treadmill    Education - 12th grade   Social Determinants of Health   Financial Resource Strain: Low Risk    Difficulty of Paying Living Expenses: Not hard at all  Food Insecurity: No Food Insecurity   Worried About Charity fundraiser in the Last Year: Never true   Ran Out of Food in the Last Year: Never true  Transportation Needs: No Transportation Needs   Lack of Transportation (Medical): No   Lack of Transportation (Non-Medical): No  Physical Activity: Sufficiently Active   Days of Exercise per Week: 5 days   Minutes of Exercise per Session: 30 min  Stress: No Stress Concern Present   Feeling of Stress : Not at all  Social Connections: Moderately Integrated   Frequency of Communication with Friends and Family: More than three times a week   Frequency of Social Gatherings with Friends and Family: More than three times a week   Attends Religious Services: More than 4 times per year   Active Member of [employer] or Organizations: No   Attends Archivist Meetings: Never   Marital Status: Married  Human resources officer Violence: Not on file     FAMILY HISTORY Family History  Problem Relation Age of Onset   Stroke Mother        Deceased, 41s   Atrial fibrillation Father    Colon cancer Father    Other Sister        Pick's  disease, deceased   Healthy Daughter       Review of Systems: Unable to obtain due to the patient's altered mental status  Physical Exam: Vitals:   03/22/21 0733 03/22/21 1100  BP: 117/60 (!) 110/50  Pulse: 78 69  Resp: 16 (!) 22  Temp: 98 F (36.7 C) 98.2 F (36.8 C)   SpO2: 97% 96%   No intake/output data recorded.  Intake/Output Summary (Last 24 hours) at 03/22/2021 1258 Last data filed at 03/22/2021 0400 Gross per 24 hour  Intake 664.8 ml  Output --  Net 664.8 ml   General: Lethargic, lying in bed, no distress HEENT: anicteric sclera, oropharynx clear without lesions CV: regular rate, normal rhythm, no murmurs, no peripheral edema Lungs: clear to auscultation bilaterally, normal work of breathing Abd: soft, non-tender, non-distended Skin: no visible lesions or rashes Psych: tired, minimally responsive to questions, mood appropriate Musculoskeletal: no obvious deformities Neuro: Lethargic, moves all 4 extremities spontaneously, does not answer questions, no other obvious focal deficits   Test Results Reviewed Lab Results  Component Value Date   NA 119 (LL) 03/22/2021   K 4.2 03/22/2021   CL 89 (L) 03/22/2021   CO2 19 (L) 03/22/2021   BUN 31 (H) 03/22/2021   CREATININE 2.06 (H) 03/22/2021   GFR 26.26 (L) 03/21/2021   CALCIUM 8.5 (L) 03/22/2021   ALBUMIN 3.6 03/21/2021     I have reviewed all relevant outside healthcare records related to the patient's current hospitalization

## 2021-03-22 NOTE — Progress Notes (Signed)
HR 150s Afib RVR. NP notified.

## 2021-03-22 NOTE — Progress Notes (Signed)
HR Afib 150s-170s. Notified NP. Ordered 150mg  Amiodarone bolus. Will continue to monitor.  Era Bumpers, RN

## 2021-03-22 NOTE — Progress Notes (Signed)
   03/22/21 0243  Vitals  Temp 97.7 F (36.5 C)  Temp Source Oral  BP 121/74  MAP (mmHg) 89  BP Location Left Arm  BP Method Automatic  Patient Position (if appropriate) Lying  Pulse Rate 73  ECG Heart Rate 76  Resp 20  Level of Consciousness  Level of Consciousness Alert  MEWS COLOR  MEWS Score Color Green  Oxygen Therapy  SpO2 97 %  O2 Device Nasal Cannula  O2 Flow Rate (L/min) 2 L/min  PCA/Epidural/Spinal Assessment  Respiratory Pattern Regular;Unlabored  Height and Weight  Height 5\' 2"  (1.575 m)  Weight 61.2 kg  Type of Scale Used Bed  BSA (Calculated - sq m) 1.64 sq meters  BMI (Calculated) 24.67  Weight in (lb) to have BMI = 25 136.4  MEWS Score  MEWS Temp 0  MEWS Systolic 0  MEWS Pulse 0  MEWS RR 0  MEWS LOC 0  MEWS Score 0   Patient admitted to unit from [facility] admissions notified.  Patient alert but lethargic, oriented to self at this time. Patient placed on 2L [place] during transport and appears to be breathing comfortably. RT notified. Patient's husband at the bedside.

## 2021-03-22 NOTE — TOC Benefit Eligibility Note (Signed)
Patient Teacher, English as a foreign language completed.    The patient is currently admitted and upon discharge could be taking Eliquis 5 mg.  The current 30 day co-pay is, $128.97.   The patient is insured through [place], [place] Patient Advocate Specialist [place] Patient Advocate Team Direct Number: [phone]  Fax: [phone]

## 2021-03-22 NOTE — Progress Notes (Signed)
Pharmacy Antibiotic Note  Sheila Morales is a 70 y.o. female admitted on 03/21/2021 with sepsis.  Pharmacy has been consulted for cefepime and vancomycin dosing.  Patient's serum creatinine worsened to 2.0 this morning.  WBC down to 19 LA 0.9, tmax noted to be 100F  Random vancomycin level ~12 hours since last dose was within goal at 16, will redose this afternoon.   Plan: Flagyl per MD Cefepime 2g q24h for now Vancomycin 750mg  IV q24 hours Trend WBC, fever, renal function, and clinical course Follow-up cultures and de-escalate antibiotics as appropriate.  Height: 5\' 2"  (157.5 cm) Weight: 61.2 kg (134 lb 14.7 oz) IBW/kg (Calculated) : 50.1  Temp (24hrs), Avg:98.5 F (36.9 C), Min:97.7 F (36.5 C), Max:100 F (37.8 C)  Recent Labs  Lab 03/19/21 1226 03/21/21 1206 03/21/21 1915 03/21/21 2110 03/22/21 0648  WBC 20.9*  --  24.8*  --  19.4*  CREATININE 1.38* 1.90* 1.86*  --  2.06*  LATICACIDVEN  --   --  1.3 0.9  --      Estimated Creatinine Clearance: 21.6 mL/min (A) (by C-G formula based on SCr of 2.06 mg/dL (H)).    No Known Allergies  Antimicrobials this admission: flagyl 11/30 >>  cefepime 11/30 >>  vancomycin 11/30 >>   Microbiology results: 11/30 bldx2: GNR 11/30 resp cx:  11/30 urine:   Thank you for allowing pharmacy to be a part of this patient's care.  Erin Hearing PharmD., BCPS Clinical Pharmacist 03/22/2021 12:55 PM

## 2021-03-23 ENCOUNTER — Inpatient Hospital Stay (HOSPITAL_COMMUNITY): Payer: PPO

## 2021-03-23 DIAGNOSIS — J9600 Acute respiratory failure, unspecified whether with hypoxia or hypercapnia: Secondary | ICD-10-CM | POA: Diagnosis not present

## 2021-03-23 DIAGNOSIS — I4891 Unspecified atrial fibrillation: Secondary | ICD-10-CM | POA: Diagnosis not present

## 2021-03-23 DIAGNOSIS — I5033 Acute on chronic diastolic (congestive) heart failure: Secondary | ICD-10-CM | POA: Diagnosis not present

## 2021-03-23 DIAGNOSIS — E871 Hypo-osmolality and hyponatremia: Secondary | ICD-10-CM | POA: Diagnosis not present

## 2021-03-23 LAB — CBC WITH DIFFERENTIAL/PLATELET
Abs Immature Granulocytes: 0.13 10*3/uL — ABNORMAL HIGH (ref 0.00–0.07)
Basophils Absolute: 0 10*3/uL (ref 0.0–0.1)
Basophils Relative: 0 %
Eosinophils Absolute: 0 10*3/uL (ref 0.0–0.5)
Eosinophils Relative: 0 %
HCT: 32.8 % — ABNORMAL LOW (ref 36.0–46.0)
Hemoglobin: 11.9 g/dL — ABNORMAL LOW (ref 12.0–15.0)
Immature Granulocytes: 1 %
Lymphocytes Relative: 4 %
Lymphs Abs: 0.6 10*3/uL — ABNORMAL LOW (ref 0.7–4.0)
MCH: 32.6 pg (ref 26.0–34.0)
MCHC: 36.3 g/dL — ABNORMAL HIGH (ref 30.0–36.0)
MCV: 89.9 fL (ref 80.0–100.0)
Monocytes Absolute: 2.4 10*3/uL — ABNORMAL HIGH (ref 0.1–1.0)
Monocytes Relative: 15 %
Neutro Abs: 13 10*3/uL — ABNORMAL HIGH (ref 1.7–7.7)
Neutrophils Relative %: 80 %
Platelets: 169 10*3/uL (ref 150–400)
RBC: 3.65 MIL/uL — ABNORMAL LOW (ref 3.87–5.11)
RDW: 13.4 % (ref 11.5–15.5)
WBC: 16.1 10*3/uL — ABNORMAL HIGH (ref 4.0–10.5)
nRBC: 0 % (ref 0.0–0.2)

## 2021-03-23 LAB — COMPREHENSIVE METABOLIC PANEL
ALT: 23 U/L (ref 0–44)
AST: 31 U/L (ref 15–41)
Albumin: 2.7 g/dL — ABNORMAL LOW (ref 3.5–5.0)
Alkaline Phosphatase: 118 U/L (ref 38–126)
Anion gap: 13 (ref 5–15)
BUN: 35 mg/dL — ABNORMAL HIGH (ref 8–23)
CO2: 20 mmol/L — ABNORMAL LOW (ref 22–32)
Calcium: 8.3 mg/dL — ABNORMAL LOW (ref 8.9–10.3)
Chloride: 89 mmol/L — ABNORMAL LOW (ref 98–111)
Creatinine, Ser: 2.03 mg/dL — ABNORMAL HIGH (ref 0.44–1.00)
GFR, Estimated: 26 mL/min — ABNORMAL LOW (ref >60)
Glucose, Bld: 96 mg/dL (ref 70–99)
Potassium: 3.7 mmol/L (ref 3.5–5.1)
Sodium: 122 mmol/L — ABNORMAL LOW (ref 135–145)
Total Bilirubin: 0.5 mg/dL (ref 0.3–1.2)
Total Protein: 6.4 g/dL — ABNORMAL LOW (ref 6.5–8.1)

## 2021-03-23 LAB — URINE CULTURE: Culture: NO GROWTH

## 2021-03-23 LAB — PHOSPHORUS: Phosphorus: 3.7 mg/dL (ref 2.5–4.6)

## 2021-03-23 LAB — HEPARIN LEVEL (UNFRACTIONATED)
Heparin Unfractionated: <0.1 IU/mL — ABNORMAL LOW (ref 0.30–0.70)
Heparin Unfractionated: 0.18 IU/mL — ABNORMAL LOW (ref 0.30–0.70)
Heparin Unfractionated: 0.22 IU/mL — ABNORMAL LOW (ref 0.30–0.70)

## 2021-03-23 LAB — NA AND K (SODIUM & POTASSIUM), RAND UR
Potassium Urine: 42 mmol/L
Sodium, Ur: 37 mmol/L

## 2021-03-23 LAB — UREA NITROGEN, URINE: Urea Nitrogen, Ur: 143 mg/dL

## 2021-03-23 LAB — GLUCOSE, CAPILLARY: Glucose-Capillary: 100 mg/dL — ABNORMAL HIGH (ref 70–99)

## 2021-03-23 LAB — OSMOLALITY, URINE: Osmolality, Ur: 375 mOsm/kg (ref 300–900)

## 2021-03-23 LAB — MAGNESIUM: Magnesium: 1.8 mg/dL (ref 1.7–2.4)

## 2021-03-23 MED ORDER — METOPROLOL TARTRATE 5 MG/5ML IV SOLN
5.0000 mg | Freq: Once | INTRAVENOUS | Status: AC
  Administered 2021-03-24: 5 mg via INTRAVENOUS
  Filled 2021-03-23: qty 5

## 2021-03-23 MED ORDER — METOPROLOL TARTRATE 5 MG/5ML IV SOLN
5.0000 mg | Freq: Once | INTRAVENOUS | Status: AC
  Administered 2021-03-23: 5 mg via INTRAVENOUS
  Filled 2021-03-23: qty 5

## 2021-03-23 MED ORDER — HEPARIN BOLUS VIA INFUSION
850.0000 [IU] | Freq: Once | INTRAVENOUS | Status: AC
  Administered 2021-03-23: 850 [IU] via INTRAVENOUS
  Filled 2021-03-23: qty 850

## 2021-03-23 MED ORDER — LACTATED RINGERS IV BOLUS
500.0000 mL | Freq: Once | INTRAVENOUS | Status: AC
  Administered 2021-03-23: 500 mL via INTRAVENOUS

## 2021-03-23 MED ORDER — ALBUMIN HUMAN 25 % IV SOLN
25.0000 g | Freq: Four times a day (QID) | INTRAVENOUS | Status: AC
  Administered 2021-03-23 – 2021-03-24 (×3): 25 g via INTRAVENOUS
  Filled 2021-03-23 (×3): qty 100

## 2021-03-23 MED ORDER — HEPARIN BOLUS VIA INFUSION
2000.0000 [IU] | Freq: Once | INTRAVENOUS | Status: AC
  Administered 2021-03-23: 2000 [IU] via INTRAVENOUS
  Filled 2021-03-23: qty 2000

## 2021-03-23 MED ORDER — DIPHENHYDRAMINE HCL 25 MG PO CAPS
25.0000 mg | ORAL_CAPSULE | Freq: Every evening | ORAL | Status: DC | PRN
  Administered 2021-03-23: 25 mg via ORAL
  Filled 2021-03-23: qty 1

## 2021-03-23 MED ORDER — LIP MEDEX EX OINT
TOPICAL_OINTMENT | CUTANEOUS | Status: DC | PRN
  Administered 2021-03-24 (×2): 75 via TOPICAL
  Filled 2021-03-23 (×2): qty 7

## 2021-03-23 MED ORDER — FUROSEMIDE 10 MG/ML IJ SOLN
60.0000 mg | Freq: Once | INTRAMUSCULAR | Status: AC
  Administered 2021-03-24: 60 mg via INTRAVENOUS
  Filled 2021-03-23: qty 6

## 2021-03-23 MED ORDER — MAGNESIUM SULFATE 2 GM/50ML IV SOLN
2.0000 g | Freq: Once | INTRAVENOUS | Status: AC
  Administered 2021-03-23: 2 g via INTRAVENOUS
  Filled 2021-03-23: qty 50

## 2021-03-23 NOTE — Evaluation (Signed)
Clinical/Bedside Swallow Evaluation Patient Details  Name: Sheila Morales MRN: [id number] Date of Birth: May 26, 1950  Today's Date: 03/23/2021 Time: SLP Start Time (ACUTE ONLY): 0904 SLP Stop Time (ACUTE ONLY): 0925 SLP Time Calculation (min) (ACUTE ONLY): 21 min  Past Medical History:  Past Medical History:  Diagnosis Date   Acute respiratory failure with hypoxia ([place]) 11/05/2017   Arthritis    Colitis, ischemic (HCC)    COPD (chronic obstructive pulmonary disease) (HCC)    External hemorrhoids    H/O: rheumatic fever    IBS (irritable bowel syndrome)    Personal history colonic adenoma 03/25/2008   06/2007 right sided adenoma and 2 right hyperplastic polyps     Restless leg syndrome    questionable   Systemic lupus erythematosus (HCC)    Tubular adenoma of colon    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    Past Surgical History:  Past Surgical History:  Procedure Laterality Date   CHOLECYSTECTOMY     CHOLECYSTECTOMY, LAPAROSCOPIC     COLONOSCOPY     ESOPHAGOGASTRODUODENOSCOPY     lesion, vulva excision     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   Procedure: PACEMAKER IMPLANT;  Surgeon: [name], MD;  Location: [facility];  Service: Cardiovascular;  Laterality: N/A;   TONSILLECTOMY     VIDEO BRONCHOSCOPY Bilateral 11/06/2017   Procedure: VIDEO BRONCHOSCOPY WITHOUT FLUORO;  Surgeon: Marshell Garfinkel, MD;  Location: [facility];  Service: Cardiopulmonary;  Laterality: Bilateral;   HPI:  Pt is a 70 yo female admitted with AMS and acute respiratory failure in the setting of CHF with concern for PNA, although CT Chest was negative for acute airspace disease or PNA. PMH includes: COPD, IBS, RLS, lupus, CHF, SSS s/p PPM    Assessment / Plan / Recommendation  Clinical Impression  Pt was very drowsy during this evaluation with confusion evident when alert enough to respond. She was able to take a few cup sips of coffee with hand-over-hand assist provided during self-feeding to  increase awareness of task. No overt signs of dysphagia or aspiration were noted, although sample was was limited by mentation. Solids were offered but she did not maintain an adequate LOA to consume them. Per pt's husband, Sheila Morales, pt has no h/o dysphagia and has not exhibited any difficulties when alert this admission (although her intake has been limited). Note that CT was also clear of any PNA. Recommend continuing with regular solids and thin liquids, but educated Sheila Morales on potential risk in light of AMS and acute deconditioning. S/s of dysphagia and aspiration for which to monitor were reviewed, as were general safety precautions, and he verbalized his understanding. Will continue to follow briefly. SLP Visit Diagnosis: Dysphagia, unspecified (R13.10)    Aspiration Risk  Mild aspiration risk;Risk for inadequate nutrition/hydration;Moderate aspiration risk    Diet Recommendation Regular;Thin liquid (only when fully alert and accepting)   Liquid Administration via: Cup;Straw Medication Administration: Whole meds with liquid Supervision: Staff to assist with self feeding;Full supervision/cueing for compensatory strategies Compensations: Minimize environmental distractions;Slow rate;Small sips/bites Postural Changes: Seated upright at 90 degrees    Other  Recommendations Oral Care Recommendations: Oral care BID    Recommendations for follow up therapy are one component of a multi-disciplinary discharge planning process, led by the attending physician.  Recommendations may be updated based on patient status, additional functional criteria and insurance authorization.  Follow up Recommendations  (tba)      Assistance Recommended at Discharge    Functional Status  Assessment Patient has had a recent decline in their functional status and demonstrates the ability to make significant improvements in function in a reasonable and predictable amount of time.  Frequency and Duration min 2x/week  1  week       Prognosis Prognosis for Safe Diet Advancement: Good Barriers to Reach Goals: Cognitive deficits      Swallow Study   General HPI: Pt is a 70 yo female admitted with AMS and acute respiratory failure in the setting of CHF with concern for PNA, although CT Chest was negative for acute airspace disease or PNA. PMH includes: COPD, IBS, RLS, lupus, CHF, SSS s/p PPM Type of Study: Bedside Swallow Evaluation Previous Swallow Assessment: none in chart Diet Prior to this Study: Regular;Thin liquids Temperature Spikes Noted: No Respiratory Status: Nasal cannula History of Recent Intubation: No Behavior/Cognition: Lethargic/Drowsy Oral Care Completed by SLP: No Self-Feeding Abilities: Total assist Patient Positioning: Upright in bed Baseline Vocal Quality: Normal    Oral/Motor/Sensory Function Overall Oral Motor/Sensory Function:  (difficulty assessing due to mentation)   Ice Chips Ice chips: Not tested   Thin Liquid Thin Liquid: Within functional limits Presentation: Cup    Nectar Thick Nectar Thick Liquid: Not tested   Honey Thick Honey Thick Liquid: Not tested   Puree Puree: Not tested   Solid     Solid: Not tested      Osie Bond., M.A. [facility] Pager [phone] Office [phone]  03/23/2021,10:25 AM

## 2021-03-23 NOTE — Progress Notes (Signed)
[facility] notified regarding patient's heartrate continuing in the 140s-150s.  Patient does not appear to have any symptom  changes and patient states she feels okay. Patient is still only able to answer he name and birthday.  Since the patient continues to be asymptomatic, there are no new orders at this time.  Amiodarone still running at 60mg /hr.

## 2021-03-23 NOTE — Consult Note (Signed)
[name]    Date of Admission:  03/21/2021   Total days of inpatient antibiotics 3        Reason for Consult: E coli bacteremia    Principal Problem:   Acute CHF (congestive heart failure) (HCC) Active Problems:   Tobacco use   COPD (chronic obstructive pulmonary disease) (HCC)   Hyponatremia   AKI (acute kidney injury) ([place])   Hypertension   Pulmonary vascular congestion   Acute metabolic encephalopathy   Assessment: 70 YF with sinus node dysfunction SP PPM on 05/26/20, COPD, SLE on methotrexate admitted for hyponatremia found to have Ecoli bacteremia.   # Ecoli bacteremia suspected 2/2 UTI in an immunocompromised patient -On review of urgent care note on 11/28 pt had presented with complaint of lower abdominal pain, constipation, fatigue for about 2-3 days prior. Although urine Cx show no growth it was obtained a day after starting broad spectrum antibiotics. As such suspect bacteremia 2/2 UTI. I am less concerned about PPM infection with gram negative bacteremia, with no indication of infection on exam and negative TTE.  -Per hyponatremia nephrology consulted and suspected acute on chronic hyponatremia was exacerbated by diuretics and decreased oral intake. Recommendations:  -Repeat blood Cx -Continue ceftriaxone -Follow blood Cx form 11/30 Microbiology:   Antibiotics: Cefepime 11/30 Ceftriaxone 12/1-p Metronidazole 11/30 Vancomycin 11/30-p Cultures: Blood 11/302/2 Ecoli Urine 12/1 NGTD  HPI: Sheila Morales is a 70 y.o. female sinus node dysfunction SP PPM insertion on 05/26/20, COPD, lupus on methotrexate, restless leg syndrome, IBS admitted for hyponatremia.  Patient presented with dyspnea, fatigue and altered mental status.  History was obtained by her husband.  He reports that she was short of breath for about 4 days.  She had coughing spells with sputum production.  Tested for COVID which was negative at urgent care.  Sent to the  ED due to crackles and congestion seen on lung exam.  PT left due to long wait time in the ED. In the interim, she was  started on lasix and antibiotics(husband reports she did not start these).  Called back because sodium was 113.  She was started on broad-spectrum antibiotics with  vancomycin, cefepime, metronidazole.  CT lung showed no acute airspace disease.  Patient was admitted for acute CHF exacerbation. And hyponatremia. Blood cultures returned positive for E. coli.  ID consulted for further management. Today, husband was at bedside.  He reports that patient was not on any antibiotics prior to hospitalization.  He stated that she did not have any nausea, vomiting, diarrhea or recent GI procedures.  She does not have a history of UTIs either.  She was not complaining of dysuria.  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     Social History   Tobacco Use   Smoking status:  Every Day    Packs/day: 0.25    Years: 30.00    Pack years: 7.50    Types: Cigarettes    Last attempt to quit: 01/21/2020    Years since quitting: 1.1   Smokeless tobacco: Never   Tobacco comments:    smokes "every now and then" 08/24/20 [facility]   Vaping Use   Vaping Use: Never used  Substance Use Topics   Alcohol use: Yes    Alcohol/week: 5.0 standard drinks    Types: 5 Standard drinks or equivalent per week    Comment: 4 out of 7 dyas per husband at  bedside   Drug use: No    Family History  Problem Relation Age of Onset   Stroke Mother        Deceased, 75s   Atrial fibrillation Father    Colon cancer Father    Other Sister        Pick's disease, deceased   Healthy Daughter    Scheduled Meds:  aspirin EC  81 mg Oral Daily   folic acid  1 mg Oral Daily   ipratropium  0.5 mg Nebulization Q6H   levalbuterol  0.63 mg Nebulization Q6H   Continuous Infusions:  sodium chloride Stopped (03/22/21 0037)   albumin human 25 g (03/23/21 1339)   amiodarone 60 mg/hr (03/23/21 1312)   cefTRIAXone (ROCEPHIN)  IV 2 g (03/22/21 1810)   heparin 1,100 Units/hr (03/23/21 1007)   PRN Meds:.sodium chloride, acetaminophen **OR** acetaminophen, albuterol, benzonatate, morphine injection, nitroGLYCERIN, ondansetron **OR** ondansetron (ZOFRAN) IV, oxyCODONE No Known Allergies  OBJECTIVE: Blood pressure 123/87, pulse (!) 140, temperature 98.8 F (37.1 C), temperature source Oral, resp. rate 20, height 5\' 2"  (1.575 m), weight 58.6 kg, SpO2 99 %.  Physical Exam Constitutional:      Appearance: Normal appearance.  HENT:     Head: Normocephalic and atraumatic.     Right Ear: Tympanic membrane normal.     Left Ear: Tympanic membrane normal.     Nose: Nose normal.     Mouth/Throat:     Mouth: Mucous membranes are moist.  Eyes:     Extraocular Movements: Extraocular movements intact.     Conjunctiva/sclera: Conjunctivae normal.     Pupils: Pupils are equal, round, and reactive to light.  Cardiovascular:     Rate and Rhythm: Normal rate and regular rhythm.     Heart sounds: No murmur heard.   No friction rub. No gallop.  Pulmonary:     Effort: Pulmonary effort is normal.     Breath sounds: Normal breath sounds.  Abdominal:     General: Abdomen is flat.     Palpations: Abdomen is soft.  Musculoskeletal:        General: Normal range of motion.  Skin:    General: Skin is warm and dry.  Neurological:     General: No focal deficit present.   Psychiatric:        Mood and Affect: Mood normal.    Lab Results Lab Results  Component Value Date   WBC 16.1 (H) 03/23/2021   HGB 11.9 (L) 03/23/2021   HCT 32.8 (L) 03/23/2021   MCV 89.9 03/23/2021   PLT 169 03/23/2021    Lab Results  Component Value Date   CREATININE 2.03 (H) 03/23/2021   BUN 35 (H) 03/23/2021   NA 122 (L) 03/23/2021   K 3.7 03/23/2021   CL 89 (L) 03/23/2021   CO2 20 (L) 03/23/2021    Lab Results  Component  Value Date   ALT 23 03/23/2021   AST 31 03/23/2021   ALKPHOS 118 03/23/2021   BILITOT 0.5 03/23/2021       Laurice Record, [facility] 03/23/2021, 4:06 PM

## 2021-03-23 NOTE — Progress Notes (Addendum)
Progress Note  Patient Name: Sheila Morales Date of Encounter: 03/23/2021  [facility] Cardiologist: Elouise Munroe, MD   Subjective   Family at bedside. Still some confusion. No chest pain.   Inpatient Medications    Scheduled Meds:  aspirin EC  81 mg Oral Daily   folic acid  1 mg Oral Daily   ipratropium  0.5 mg Nebulization Q6H   levalbuterol  0.63 mg Nebulization Q6H   Continuous Infusions:  sodium chloride Stopped (03/22/21 0037)   amiodarone 60 mg/hr (03/23/21 0741)   cefTRIAXone (ROCEPHIN)  IV 2 g (03/22/21 1810)   heparin 950 Units/hr (03/23/21 0001)   vancomycin 750 mg (03/22/21 1740)   PRN Meds: sodium chloride, acetaminophen **OR** acetaminophen, albuterol, benzonatate, morphine injection, nitroGLYCERIN, ondansetron **OR** ondansetron (ZOFRAN) IV, oxyCODONE   Vital Signs    Vitals:   03/23/21 0100 03/23/21 0415 03/23/21 0546 03/23/21 0727  BP:  (!) 134/95  (!) 126/93  Pulse:    (!) 139  Resp:  20  (!) 24  Temp:  98.8 F (37.1 C)    TempSrc:  Oral    SpO2: 96% 98%  98%  Weight:   58.6 kg   Height:        Intake/Output Summary (Last 24 hours) at 03/23/2021 0803 Last data filed at 03/23/2021 0802 Gross per 24 hour  Intake 2942.65 ml  Output 1750 ml  Net 1192.65 ml   Last 3 Weights 03/23/2021 03/22/2021 03/21/2021  Weight (lbs) 129 lb 3 oz 134 lb 14.7 oz 135 lb 9.6 oz  Weight (kg) 58.6 kg 61.2 kg 61.508 kg      Telemetry    Atrial fibrillation 140-150s, PVCs - Personally Reviewed  ECG    N/A  Physical Exam   GEN: Ill appearing elderly confused female in no acute distress.   Neck: No JVD Cardiac: IR IR tachycardic, no murmurs, rubs, or gallops.  Respiratory: diminished breath sound  GI: Soft, nontender, non-distended  MS: No edema; No deformity. Neuro:  Nonfocal  Psych: Normal affect   Labs    Chemistry Recent Labs  Lab 03/21/21 1915 03/21/21 2118 03/22/21 1431 03/22/21 1939 03/23/21 0039  NA 116*   < > 124* 121* 122*  K  4.8   < > 4.0 4.0 3.7  CL 83*   < > 91* 89* 89*  CO2 20*   < > 21* 20* 20*  GLUCOSE 80   < > 111* 102* 96  BUN 29*   < > 32* 35* 35*  CREATININE 1.86*   < > 2.11* 2.07* 2.03*  CALCIUM 9.0   < > 8.9 8.2* 8.3*  MG  --   --  2.0  --  1.8  PROT 7.3  --  6.9  --  6.4*  ALBUMIN 3.6  --  3.0*  --  2.7*  AST 42*  --  42*  --  31  ALT 26  --  26  --  23  ALKPHOS 115  --  122  --  118  BILITOT 0.9  --  1.0  --  0.5  GFRNONAA 29*   < > 25* 25* 26*  ANIONGAP 13   < > 12 12 13    < > = values in this interval not displayed.    Hematology Recent Labs  Lab 03/22/21 0648 03/22/21 1431 03/23/21 0039  WBC 19.4* 19.1* 16.1*  RBC 3.52* 3.97 3.65*  HGB 11.4* 12.8 11.9*  HCT 31.6* 36.6 32.8*  MCV 89.8  92.2 89.9  MCH 32.4 32.2 32.6  MCHC 36.1* 35.0 36.3*  RDW 13.3 13.5 13.4  PLT 141* 153 169   Thyroid  Recent Labs  Lab 03/22/21 0648  TSH 0.909    BNP Recent Labs  Lab 03/19/21 1226 03/21/21 1206 03/21/21 1915 03/22/21 0648  BNP 1,149.3*  --  1,399.1* 1,498.1*  PROBNP  --  1,279.0*  --   --      Radiology    CT Chest Wo Contrast  Result Date: 03/21/2021 CLINICAL DATA:  Respiratory illness EXAM: CT CHEST WITHOUT CONTRAST TECHNIQUE: Multidetector CT imaging of the chest was performed following the standard protocol without IV contrast. COMPARISON:  Chest x-ray 03/21/2021, chest CT 11/04/2017 FINDINGS: Cardiovascular: Limited evaluation without intravenous contrast. Partially visualized intracardiac pacing leads. Cardiomegaly with coronary vascular calcifications. No pericardial effusion. Moderate aortic atherosclerosis. No aneurysm Mediastinum/Nodes: Midline trachea. No thyroid mass. Multiple small left supraclavicular lymph nodes. Slightly enlarged precarinal lymph node measuring 13 mm. Esophagus within normal limits. Lungs/Pleura: Mild emphysema. Mild subpleural reticulation likely due to fibrosis. Bandlike density in the left lung base consistent with atelectasis or scar. No acute  consolidation, pleural effusion or pneumothorax. Upper Abdomen: No acute abnormality. Musculoskeletal: No chest wall mass or suspicious bone lesions identified. IMPRESSION: 1. Negative for acute airspace disease or pneumonia 2. Mild emphysema. Mild subpleural reticulation, consistent with mild degree of fibrosis. 3. Cardiomegaly 4. Mild mediastinal lymph nodes which are nonspecific and may be reactive. Aortic Atherosclerosis (ICD10-I70.0) and Emphysema (ICD10-J43.9). Electronically Signed   By: Donavan Foil M.D.   On: 03/21/2021 22:12   US RENAL  Result Date: 03/23/2021 CLINICAL DATA:  Acute kidney injury. EXAM: RENAL / URINARY TRACT ULTRASOUND COMPLETE COMPARISON:  March 21, 2004 FINDINGS: Right Kidney: Renal measurements: 12.9 cm x 5.1 cm x 4.2 cm = volume: 145.59 mL. Echogenicity within normal limits. No mass or hydronephrosis visualized. Left Kidney: Renal measurements: 13.3 cm x 4.7 cm x 4.5 cm = volume: 147.69 mL. Echogenicity within normal limits. No mass or hydronephrosis visualized. Bladder: Appears normal for degree of bladder distention. Other: None. IMPRESSION: Normal renal ultrasound. Electronically Signed   By: Virgina Norfolk M.D.   On: 03/23/2021 01:49   DG Chest Port 1 View  Result Date: 03/21/2021 CLINICAL DATA:  Cough and altered mental status. EXAM: PORTABLE CHEST 1 VIEW COMPARISON:  March 19, 2021 FINDINGS: There is a dual lead AICD. Diffuse, chronic appearing increased interstitial lung markings are noted. This is stable in appearance when compared to the prior exam. Mild, stable bibasilar atelectasis is noted. There is no evidence of a pleural effusion or pneumothorax. The heart size and mediastinal contours are within normal limits. The visualized skeletal structures are unremarkable. IMPRESSION: 1. Diffuse, chronic appearing increased interstitial lung markings, stable in appearance when compared to the prior exam. 2. Mild, stable bibasilar atelectasis. Electronically  Signed   By: Virgina Norfolk M.D.   On: 03/21/2021 19:27   ECHOCARDIOGRAM COMPLETE  Result Date: 03/22/2021    ECHOCARDIOGRAM REPORT   Patient Name:   Sheila Morales Date of Exam: 03/22/2021 Medical Rec #:  [id number]    Height:       62.0 in Accession #:    [id number]   Weight:       134.9 lb Date of Birth:  [birth year]    BSA:          1.617 m Patient Age:    32 years     BP:  117/60 mmHg Patient Gender: F            HR:           73 bpm. Exam Location:  Inpatient Procedure: 2D Echo, 3D Echo, Cardiac Doppler and Color Doppler Indications:    I50.40* Unspecified combined systolic (congestive) and diastolic                 (congestive) heart failure  History:        Patient has prior history of Echocardiogram examinations, most                 recent 03/09/2020. CHF, Abnormal ECG and Pacemaker, COPD,                 Arrythmias:Bradycardia; Risk Factors:Current Smoker and                 Hypertension.  Sonographer:    Roseanna Rainbow RDCS Referring Phys: [id number] ASIA B [place]  Sonographer Comments: Technically difficult study due to poor echo windows. IMPRESSIONS  1. Left ventricular ejection fraction, by estimation, is 70 to 75%. The left ventricle has hyperdynamic function. The left ventricle has no regional wall motion abnormalities. There is mild left ventricular hypertrophy. Left ventricular diastolic parameters are consistent with Grade II diastolic dysfunction (pseudonormalization). Elevated left atrial pressure.  2. Right ventricular systolic function is normal. The right ventricular size is normal. There is mildly elevated pulmonary artery systolic pressure.  3. Left atrial size was mildly dilated.  4. The mitral valve is normal in structure. Mild mitral valve regurgitation. No evidence of mitral stenosis.  5. The aortic valve is tricuspid. Aortic valve regurgitation is not visualized. Aortic valve sclerosis/calcification is present, without any evidence of aortic stenosis.  6. The inferior vena  cava is normal in size with greater than 50% respiratory variability, suggesting right atrial pressure of 3 mmHg. Comparison(s): No significant change from prior study. FINDINGS  Left Ventricle: Left ventricular ejection fraction, by estimation, is 70 to 75%. The left ventricle has hyperdynamic function. The left ventricle has no regional wall motion abnormalities. The left ventricular internal cavity size was normal in size. There is mild left ventricular hypertrophy. Left ventricular diastolic parameters are consistent with Grade II diastolic dysfunction (pseudonormalization). Elevated left atrial pressure. Right Ventricle: The right ventricular size is normal. Right ventricular systolic function is normal. There is mildly elevated pulmonary artery systolic pressure. The tricuspid regurgitant velocity is 3.18 m/s, and with an assumed right atrial pressure of 3 mmHg, the estimated right ventricular systolic pressure is 57.8 mmHg. Left Atrium: Left atrial size was mildly dilated. Right Atrium: Right atrial size was normal in size. Pericardium: There is no evidence of pericardial effusion. Mitral Valve: The mitral valve is normal in structure. Mild mitral annular calcification. Mild mitral valve regurgitation. No evidence of mitral valve stenosis. MV peak gradient, 12.0 mmHg. The mean mitral valve gradient is 3.0 mmHg. Tricuspid Valve: The tricuspid valve is normal in structure. Tricuspid valve regurgitation is mild . No evidence of tricuspid stenosis. Aortic Valve: The aortic valve is tricuspid. Aortic valve regurgitation is not visualized. Aortic valve sclerosis/calcification is present, without any evidence of aortic stenosis. Pulmonic Valve: The pulmonic valve was normal in structure. Pulmonic valve regurgitation is trivial. No evidence of pulmonic stenosis. Aorta: The aortic root is normal in size and structure. Venous: The inferior vena cava is normal in size with greater than 50% respiratory variability,  suggesting right atrial pressure of 3 mmHg. IAS/Shunts: No atrial level  shunt detected by color flow Doppler. Additional Comments: A device lead is visualized.  LEFT VENTRICLE PLAX 2D LVIDd:         3.70 cm     Diastology LVIDs:         2.50 cm     LV e' medial:    4.95 cm/s LV PW:         1.70 cm     LV E/e' medial:  29.7 LV IVS:        1.20 cm     LV e' lateral:   7.95 cm/s LVOT diam:     2.00 cm     LV E/e' lateral: 18.5 LV SV:         83 LV SV Index:   51 LVOT Area:     3.14 cm                             3D Volume EF: LV Volumes (MOD)           3D EF:        79 % LV vol d, MOD A2C: 59.3 ml LV EDV:       97 ml LV vol d, MOD A4C: 30.4 ml LV ESV:       20 ml LV vol s, MOD A2C: 22.4 ml LV SV:        77 ml LV vol s, MOD A4C: 9.2 ml LV SV MOD A2C:     36.9 ml LV SV MOD A4C:     30.4 ml LV SV MOD BP:      30.5 ml RIGHT VENTRICLE             IVC RV S prime:     14.80 cm/s  IVC diam: 1.80 cm TAPSE (M-mode): 1.8 cm LEFT ATRIUM             Index        RIGHT ATRIUM           Index LA diam:        4.60 cm 2.84 cm/m   RA Area:     12.20 cm LA Vol (A2C):   60.9 ml 37.66 ml/m  RA Volume:   27.30 ml  16.88 ml/m LA Vol (A4C):   41.0 ml 25.35 ml/m LA Biplane Vol: 52.9 ml 32.71 ml/m  AORTIC VALVE             PULMONIC VALVE LVOT Vmax:   137.00 cm/s PR End Diast Vel: 1.81 msec LVOT Vmean:  86.200 cm/s LVOT VTI:    0.263 m  AORTA Ao Root diam: 3.30 cm Ao Asc diam:  3.10 cm MITRAL VALVE                TRICUSPID VALVE MV Area (PHT): 3.27 cm     TR Peak grad:   40.4 mmHg MV Area VTI:   1.79 cm     TR Vmax:        318.00 cm/s MV Peak grad:  12.0 mmHg MV Mean grad:  3.0 mmHg     SHUNTS MV Vmax:       1.73 m/s     Systemic VTI:  0.26 m MV Vmean:      78.4 cm/s    Systemic Diam: 2.00 cm MV Decel Time: 232 msec MV E velocity: 147.00 cm/s MV A velocity: 76.80 cm/s MV E/A ratio:  1.91 Kirk Ruths MD Electronically signed  by Kirk Ruths MD Signature Date/Time: 03/22/2021/12:34:46 PM    Final     Cardiac Studies   Echo  03/22/21  1. Left ventricular ejection fraction, by estimation, is 70 to 75%. The  left ventricle has hyperdynamic function. The left ventricle has no  regional wall motion abnormalities. There is mild left ventricular  hypertrophy. Left ventricular diastolic  parameters are consistent with Grade II diastolic dysfunction  (pseudonormalization). Elevated left atrial pressure.   2. Right ventricular systolic function is normal. The right ventricular  size is normal. There is mildly elevated pulmonary artery systolic  pressure.   3. Left atrial size was mildly dilated.   4. The mitral valve is normal in structure. Mild mitral valve  regurgitation. No evidence of mitral stenosis.   5. The aortic valve is tricuspid. Aortic valve regurgitation is not  visualized. Aortic valve sclerosis/calcification is present, without any  evidence of aortic stenosis.   6. The inferior vena cava is normal in size with greater than 50%  respiratory variability, suggesting right atrial pressure of 3 mmHg.   Comparison(s): No significant change from prior study.   Patient Profile     70 y.o. female  with a hx of COPD, ischemic colitis, IBS, RLS, SLE on methotrexate, BPPV, HTN, chronic diastolic heart failure, and SSS s/p PPM in place  seen for afib and CHF.   Assessment & Plan    New onset afib RVR - Driven by underlying sepsis, CAP and E.coli bacteremia  - Initially treated with cardizem but changed to amiodarone due to soft blood pressure - HR will remain elevated until underlying issue resolves - Echo with hyperdynamic LVEF to 70-75% - Continue Amiodarone - Continue heparin for anticoagulation  2. Chronic diastolic CHF - Given IV lasix 40mg  x 2 then held due to hyponatremia - She does not appears volume overloaded but has elevated LA pressure on echo >> suspects rate related - Held ARB due to elevated Scr  3. Hyponatremia 4. AKI - Per nephrology   5. SSS s/p PPM  For questions or updates,  please contact [facility] Please consult [email or web address] for contact info under        SignedLeanor Kail, PA  03/23/2021, 8:03 AM     Personally seen and examined. Agree with APP above with the following comments: Briefly 70 yo F with a history of COPD, ischemic colitis, and new Gram - bacteremia who presents with new AF RVR Patient mentation has improved from prior; she received a low dose IV low pressors with no change in her rates.  Slight decrease in BP.  She denies palpitations but she feels poorly. Exam notable for regular tachycardia, decreased breath sounds with poor air movement Labs notable for persistently low sodium; creatinine has increased but is similar Personally reviewed relevant tests; telemetry notable for AF RVR with Ashman beats  Would recommend  - heart tachycardia is likely a response to her severe illness - does not appear volume overloaded on exam today - continue IV amiodarone, given kidney issues would not start digoxin at this time - discussed with patient and family; there are no cardiac contraindications to medications for sleep but would be cautious given waxing and waning mentation.  Rudean Haskell, MD [address], [address] [place], [place] [address] [phone]  11:07 AM

## 2021-03-23 NOTE — Progress Notes (Signed)
PROGRESS NOTE    Sheila Morales  GNF:[id number] DOB: [birth year] DOA: 03/21/2021 PCP: Hoyt Koch, MD   Brief Narrative:   The patient is a 70 year old Caucasian female with a past medical history significant for but #2 history of hypoxia, ischemic colitis, history of COPD, irritable bowel syndrome, restless leg syndrome, lupus, varicose veins of both legs as well as other comorbidities who presented to the ED with a chief complaint of dyspnea, fatigue, weakness and altered mental status.  Patient was to somewhat confused and history is obtained from the patient's husband who is at bedside and states that about 4 days ago she started feeling weak, tired and short of breath.  She complained of dyspnea at the most and did have coughing spells but clear nonproductive sputum.  She was given some Mucinex and Sudafed which improved her symptoms but then she went to urgent care was tested for COVID and they noted that she had crackles and congestion in her lungs needed sent to the ED.  She left the ED due to a long wait times and started to feel better the day after ED visit and then she is supposed to follow-up with several doctors in eventually went to her pulmonologist.  The pulmonologist refused her chart and started on antibiotics and Lasix and then came back later and told her that her sodium was extremely low and that she needed to go to the ED.  She went to [facility] and stabilized and started on oxygen and antibiotics as well as Lasix.  Around 2 AM she was transferred to [facility] for further hospitalization.  Husband did note that she had coughing spells that she had overnight and he attributed to postnasal drip.  She did have a slight temperature of 100.0 at [facility] for which she was given acetaminophen for.  The husband reports the patient's had decreased appetite for at least 5 years with weight loss and her mentation was still somewhat confused but her breathing is  stabilized improved.  In the ED she is given IV vancomycin and cefepime as well as acetaminophen, Lasix, Ativan, Flagyl, Zofran and vancomycin.  She was noted to have leukocytosis 24.8 and had a CT scan of the chest which was negative for airspace disease.  Chest x-ray showed diffuse chronic appearing interstitial lung markings that were stable from prior.  His and she did have a hyponatremia of 116.  Her BNP was elevated on admission and lactic acid trended down.  She was admitted for work-up of her volume overload and acute hypoxic respiratory failure in the setting of likely CHF of unknown type.  But then she became euvolemic and dry and did not likely have a CHF exacerbation.    Further work-up revealed that she had an E. coli bacteremia and then subsequently she went into A. fib with RVR.  Given that her heart rates were uncontrolled she was given IV diltiazem then transition to amiodarone given her softer blood pressures.  She was started on anticoagulant heparin drip.  Diuresis further held and nephrology was consulted given her AKI and they felt that is nonoliguric likely hemodynamically mediated in the setting of hypotension, ARB, diuretics and sepsis.  They bolused her 500 cc of LR and albumin to increase her intravascular volume.  Her hyponatremia started improving they also checked a urine sodium and urine awesome to review.  Patient was still confused but husband states that her main issue is now inability to sleep  Assessment & Plan:   Principal Problem:   Acute CHF (congestive heart failure) (HCC) Active Problems:   Tobacco use   COPD (chronic obstructive pulmonary disease) (HCC)   Hyponatremia   AKI (acute kidney injury) ([place])   Hypertension   Pulmonary vascular congestion   Acute metabolic encephalopathy  Acute respiratory failure with hypoxia in the suspected setting of acute diastolic CHF as well as community-acquired pneumonia -Had to be placed on BiPAP short-term and then was  weaned off -SpO2: 96 % O2 Flow Rate (L/min): 2 L/min FiO2 (%): 30 % -CT of the chest done and showed "Negative for acute airspace disease or pneumonia  Mild emphysema. Mild subpleural reticulation, consistent with mild degree of fibrosis. Cardiomegaly. Mild mediastinal lymph nodes which are nonspecific and may be reactive." -Continuous pulse oximetry maintain O2 saturation greater 90% -Continue supplemental oxygen via nasal cannula and wean O2 as tolerated -Continued with diuresis but this has not been stopped and start Xopenex/Atrovent every 6 scheduled -Respiratory status is slowly improving and she has been placed on IV ceftriaxone with hoping to avoid IV nephrotoxic antibiotics if possible -We will need an ambulatory home O2 screen prior to discharge as well as repeat chest x-ray in a.m.   Acute on congestive heart failure exacerbation likely Diastolic Failure, improved and now is dry -Major Criteria  Crackles on exam, pulm edema on chest xray -Minor criteria dyspnea on exertion but this is now improved -IV diuresis initiated with 40 mg IV Lasix, continue with 40 mg IV twice daily but now diuresis has been held given her AKI.  Weight is down to 58.6 and she appears dry now -Last Echo November 2021 which showed an ejection fraction 65-70% and grade 2 diastolic dysfunction, update echo -Daily weights, fluid restriction, strict Is and Os -Continued ARB but will hold given Renal Dysfunction  -Cardiology consulted for further evaluation and recommendations and she does not appear volume overloaded anymore but has had LA pressure on echo and suspect rate related  E. coli bacteremia -Suspected secondary to UTI in immunocompromised patient -She presented to urgent care with lower abdominal pain, constipation and fatigue for 2 to 3 days and urine culture showed no growth and was obtained after she is started on broad-spectrum antibiotics and they suspect the bacteremia secondary to UTI -ID is less  concerned about pacemaker infection with gram-negative bacteremia and there is no indication of infection on exam and negative TTE -ID recommending repeating blood cultures and continue IV ceftriaxone following blood cultures from 03/21/2021  New onset A. fib with RVR -In the setting of infection, sepsis, [facility] pneumonia and bacteremia from E. coli -Heart rates were uncontrolled in the 160s and 170s and she was initially given 5 mg IV metoprolol and then 10 mg of Cardizem and then placed on a Cardizem drip -Ultimately Cardizem has changed to IV amiodarone due to softer blood pressure -Cardiology recommends continuing amiodarone drip and heparin for anticoagulation -Echo done and showed a hyperdynamic LVEF of 7075% -Cardiology is following appreciate further evaluation recommendation  Acute Metabolic Encephalopathy -Most likely secondary to electrolyte disturbance and now infection -Infection was considered as a possibility given now that she has an E. coli bacteremia and pneumonia -Currently protecting airway, arousable -VBG does not show an explanation for altered mental status -Check TSH was within normal limits -Continue to monitor   Acute on chronic hyponatremia -Over couple of days sodium has gone from 121 -> 113 -> 116 -> 117 -> 119 and trended up to 124  and then trended down to 121 is now 122 -Mostly thought to be in the setting of fluid overload but now she was hypovolemic so the Lasix is stopped -Nephrology has been giving her 500 mL bolus and albumin every 6 -Nephrology is unclear if she has some high ADH state in the setting of pneumonia and they are going to check a urine sodium and urine osmolality to review -Urine electrolytes already ordered -Continue to monitor and nephrology is consulted for further evaluation   AKI -Creatinine increased 0.7 -> 1.86; Now BUN/Cr is 31/2.06 and trended up to 32/2.11 and is trending slowly down is 35/2.03 now -Lasix has been  discontinued -Avoid further nephrotoxic medications, contrast dyes, hypotension and dehydration renally adjust medications -Nephrology has been consulted for further evaluation and they feel that this is a nonoliguric likely hemodynamically mediated in the setting of hypotension, ARB, diuretics and sepsis -Patient has having 1.5 L urine output -UA and renal ultrasound were unremarkable -She appeared dry so nephrology has ordered her 500 cc LR bolus and albumin to help increase her intravascular volume and recommending strict I's and O's and daily weights -Currently there is no indication for dialysis  Tobacco Use Disorder -Patient is to continue with her counseling -Nicotine patch ordered for when patient does have cravings   COPD -Continue albuterol and stop Anoro and start Xopenex and Atrovent -We will start Xopenex and Atrovent   Community Acquired Pneumonia -With leukocytosis 24.8, hypoxic respiratory failure, findings on chest x-ray -WBC is slowly trending down and went to 19.1 yesterday is now 16.1 -Vancomycin has now been discontinued -Continue to monitor and repeat chest x-ray in a.m.   Hypertension -Continued irbesartan but will stop given her Renal Dysfunction  -Last BP was 110/50 -Continue to monitor blood pressures per protocol   Normocytic anemia -Hemoglobin/hematocrit is 11.9/32.8 and will check anemia panel in a.m. -Continue to monitor for signs and symptoms of bleeding; no overt bleeding noted -Repeat CBC in a.m.  DVT prophylaxis: Anticoagulated with a heparin drip Code Status: Full code Family Communication: Discussed with husband at bedside Disposition Plan: Pending further clinical improvement and clearance by specialist  Status is: Inpatient  Remains inpatient appropriate because: Continues to be encephalopathic and will need treatment for her bacteremia and improvement in her heart rate as well as her AKI  Consultants:   Cardiology Nephrology ID  Procedures: None  Antimicrobials:  Anti-infectives (From admission, onward)    Start     Dose/Rate Route Frequency Ordered Stop   03/22/21 2000  ceFEPIme (MAXIPIME) 2 g in sodium chloride 0.9 % 100 mL IVPB  Status:  Discontinued        2 g 200 mL/hr over 30 Minutes Intravenous Every 24 hours 03/21/21 2209 03/22/21 1456   03/22/21 1800  cefTRIAXone (ROCEPHIN) 2 g in sodium chloride 0.9 % 100 mL IVPB        2 g 200 mL/hr over 30 Minutes Intravenous Every 24 hours 03/22/21 1456     03/22/21 1600  vancomycin (VANCOREADY) IVPB 750 mg/150 mL  Status:  Discontinued        750 mg 150 mL/hr over 60 Minutes Intravenous Every 24 hours 03/22/21 1419 03/23/21 1018   03/21/21 2209  vancomycin variable dose per unstable renal function (pharmacist dosing)  Status:  Discontinued         Does not apply See admin instructions 03/21/21 2209 03/22/21 1425   03/21/21 1930  ceFEPIme (MAXIPIME) 2 g in sodium chloride 0.9 % 100 mL IVPB  2 g 200 mL/hr over 30 Minutes Intravenous  Once 03/21/21 1916 03/21/21 2203   03/21/21 1930  metroNIDAZOLE (FLAGYL) IVPB 500 mg        500 mg 100 mL/hr over 60 Minutes Intravenous  Once 03/21/21 1916 03/21/21 2101   03/21/21 1930  vancomycin (VANCOCIN) IVPB 1000 mg/200 mL premix        1,000 mg 200 mL/hr over 60 Minutes Intravenous  Once 03/21/21 1916 03/21/21 2253        Subjective: Seen and examined at bedside and she was still confused.  Husband states that her main issue is now insomnia.  States that she has not slept very much at all.  He feels that her respiratory rate is improved.  Patient does not answer questions appropriately.  No other concerns or complaints at this time.  Objective: Vitals:   03/23/21 0727 03/23/21 0800 03/23/21 1000 03/23/21 1500  BP: (!) 126/93 (!) 136/105 123/87   Pulse: (!) 139 (!) 143 (!) 140   Resp: (!) 24 (!) 26 20   Temp:      TempSrc:      SpO2: 98% 99% 95% 99%  Weight:      Height:         Intake/Output Summary (Last 24 hours) at 03/23/2021 1919 Last data filed at 03/23/2021 1500 Gross per 24 hour  Intake 3884.07 ml  Output 1300 ml  Net 2584.07 ml   Filed Weights   03/21/21 1842 03/22/21 0243 03/23/21 0546  Weight: 61.5 kg 61.2 kg 58.6 kg   Examination: Physical Exam:  Constitutional: WN/WD Caucasian female currently in no acute distress appears calm but is extremely confused Eyes:  Lids and conjunctivae normal, sclerae anicteric  ENMT: External Ears, Nose appear normal. Grossly normal hearing. Mucous membranes are moist.  Neck: Appears normal, supple, no cervical masses, normal ROM, no appreciable thyromegaly; No appreciable JVD Respiratory: Diminished to auscultation bilaterally with coarse breath sounds, no wheezing, rales, rhonchi or crackles. Normal respiratory effort and patient is not tachypenic. No accessory muscle use.  Unlabored breathing but wearing supplemental oxygen via nasal cannula Cardiovascular: Irregularly irregular tachycardic, no murmurs / rubs / gallops. S1 and S2 auscultated. No extremity edema.  Abdomen: Soft, non-tender, non-distended. Bowel sounds positive.  GU: Deferred. Musculoskeletal: No clubbing / cyanosis of digits/nails. No joint deformity upper and lower extremities.  Skin: No rashes, lesions, ulcers on limited skin evaluation. No induration; Warm and dry.  Neurologic: CN 2-12 grossly intact with no focal deficits. Romberg sign and cerebellar reflexes not assessed.  Psychiatric: Impaired judgment and insight.  She is awake but not alert and oriented  Data Reviewed: I have personally reviewed following labs and imaging studies  CBC: Recent Labs  Lab 03/19/21 1226 03/21/21 1915 03/21/21 2118 03/22/21 0648 03/22/21 1431 03/23/21 0039  WBC 20.9* 24.8*  --  19.4* 19.1* 16.1*  NEUTROABS 18.4* 21.9*  --   --  16.4* 13.0*  HGB 12.0 12.1 10.9* 11.4* 12.8 11.9*  HCT 34.6* 33.8* 32.0* 31.6* 36.6 32.8*  MCV 94.8 90.6  --  89.8 92.2  89.9  PLT 159 152  --  141* 153 379   Basic Metabolic Panel: Recent Labs  Lab 03/21/21 1915 03/21/21 2118 03/22/21 0648 03/22/21 1431 03/22/21 1939 03/23/21 0039  NA 116* 117* 119* 124* 121* 122*  K 4.8 4.9 4.2 4.0 4.0 3.7  CL 83*  --  89* 91* 89* 89*  CO2 20*  --  19* 21* 20* 20*  GLUCOSE 80  --  68* 111* 102* 96  BUN 29*  --  31* 32* 35* 35*  CREATININE 1.86*  --  2.06* 2.11* 2.07* 2.03*  CALCIUM 9.0  --  8.5* 8.9 8.2* 8.3*  MG  --   --   --  2.0  --  1.8  PHOS  --   --   --  4.4  --  3.7   GFR: Estimated Creatinine Clearance: 20.1 mL/min (A) (by C-G formula based on SCr of 2.03 mg/dL (H)). Liver Function Tests: Recent Labs  Lab 03/21/21 1915 03/22/21 1431 03/23/21 0039  AST 42* 42* 31  ALT 26 26 23   ALKPHOS 115 122 118  BILITOT 0.9 1.0 0.5  PROT 7.3 6.9 6.4*  ALBUMIN 3.6 3.0* 2.7*   No results for input(s): LIPASE, AMYLASE in the last 168 hours. Recent Labs  Lab 03/21/21 1915  AMMONIA 11   Coagulation Profile: Recent Labs  Lab 03/21/21 1915  INR 1.1   Cardiac Enzymes: No results for input(s): CKTOTAL, CKMB, CKMBINDEX, TROPONINI in the last 168 hours. BNP (last 3 results) Recent Labs    03/21/21 1206  PROBNP 1,279.0*   HbA1C: No results for input(s): HGBA1C in the last 72 hours. CBG: Recent Labs  Lab 03/21/21 1912 03/22/21 1316 03/22/21 1332 03/22/21 1611 03/23/21 0014  GLUCAP 82 50* 213* 118* 100*   Lipid Profile: No results for input(s): CHOL, HDL, LDLCALC, TRIG, CHOLHDL, LDLDIRECT in the last 72 hours. Thyroid Function Tests: Recent Labs    03/22/21 0648  TSH 0.909   Anemia Panel: No results for input(s): VITAMINB12, FOLATE, FERRITIN, TIBC, IRON, RETICCTPCT in the last 72 hours. Sepsis Labs: Recent Labs  Lab 03/21/21 1915 03/21/21 2110 03/22/21 1431 03/22/21 1656  LATICACIDVEN 1.3 0.9 1.3 1.0    Recent Results (from the past 240 hour(s))  Resp Panel by RT-PCR (Flu A&B, Covid) Nasopharyngeal Swab     Status: None    Collection Time: 03/19/21 12:26 PM   Specimen: Nasopharyngeal Swab; Nasopharyngeal(NP) swabs in vial transport medium  Result Value Ref Range Status   SARS Coronavirus 2 by RT PCR NEGATIVE NEGATIVE Final    Comment: (NOTE) SARS-CoV-2 target nucleic acids are NOT DETECTED.  The SARS-CoV-2 RNA is generally detectable in upper respiratory specimens during the acute phase of infection. The lowest concentration of SARS-CoV-2 viral copies this assay can detect is 138 copies/mL. A negative result does not preclude SARS-Cov-2 infection and should not be used as the sole basis for treatment or other patient management decisions. A negative result may occur with  improper specimen collection/handling, submission of specimen other than nasopharyngeal swab, presence of viral mutation(s) within the areas targeted by this assay, and inadequate number of viral copies(<138 copies/mL). A negative result must be combined with clinical observations, patient history, and epidemiological information. The expected result is Negative.  Fact Sheet for Patients:  EntrepreneurPulse.com.au  Fact Sheet for Healthcare Providers:  IncredibleEmployment.be  This test is no t yet approved or cleared by the [place] FDA and  has been authorized for detection and/or diagnosis of SARS-CoV-2 by FDA under an Emergency Use Authorization (EUA). This EUA will remain  in effect (meaning this test can be used) for the duration of the COVID-19 declaration under Section 564(b)(1) of the Act, 21 U.S.C.section 360bbb-3(b)(1), unless the authorization is terminated  or revoked sooner.       Influenza A by PCR NEGATIVE NEGATIVE Final   Influenza B by PCR NEGATIVE NEGATIVE Final    Comment: (NOTE) The Xpert Xpress  SARS-CoV-2/FLU/RSV plus assay is intended as an aid in the diagnosis of influenza from Nasopharyngeal swab specimens and should not be used as a sole basis for treatment.  Nasal washings and aspirates are unacceptable for Xpert Xpress SARS-CoV-2/FLU/RSV testing.  Fact Sheet for Patients: EntrepreneurPulse.com.au  Fact Sheet for Healthcare Providers: IncredibleEmployment.be  This test is not yet approved or cleared by the [place] FDA and has been authorized for detection and/or diagnosis of SARS-CoV-2 by FDA under an Emergency Use Authorization (EUA). This EUA will remain in effect (meaning this test can be used) for the duration of the COVID-19 declaration under Section 564(b)(1) of the Act, 21 U.S.C. section 360bbb-3(b)(1), unless the authorization is terminated or revoked.  Performed at [facility], [place] [address], [place], [place] [address]   Resp Panel by RT-PCR (Flu A&B, Covid) Nasopharyngeal Swab     Status: None   Collection Time: 03/21/21  7:15 PM   Specimen: Nasopharyngeal Swab; Nasopharyngeal(NP) swabs in vial transport medium  Result Value Ref Range Status   SARS Coronavirus 2 by RT PCR NEGATIVE NEGATIVE Final    Comment: (NOTE) SARS-CoV-2 target nucleic acids are NOT DETECTED.  The SARS-CoV-2 RNA is generally detectable in upper respiratory specimens during the acute phase of infection. The lowest concentration of SARS-CoV-2 viral copies this assay can detect is 138 copies/mL. A negative result does not preclude SARS-Cov-2 infection and should not be used as the sole basis for treatment or other patient management decisions. A negative result may occur with  improper specimen collection/handling, submission of specimen other than nasopharyngeal swab, presence of viral mutation(s) within the areas targeted by this assay, and inadequate number of viral copies(<138 copies/mL). A negative result must be combined with clinical observations, patient history, and epidemiological information. The expected result is Negative.  Fact Sheet for Patients:   EntrepreneurPulse.com.au  Fact Sheet for Healthcare Providers:  IncredibleEmployment.be  This test is no t yet approved or cleared by the [place] FDA and  has been authorized for detection and/or diagnosis of SARS-CoV-2 by FDA under an Emergency Use Authorization (EUA). This EUA will remain  in effect (meaning this test can be used) for the duration of the COVID-19 declaration under Section 564(b)(1) of the Act, 21 U.S.C.section 360bbb-3(b)(1), unless the authorization is terminated  or revoked sooner.       Influenza A by PCR NEGATIVE NEGATIVE Final   Influenza B by PCR NEGATIVE NEGATIVE Final    Comment: (NOTE) The Xpert Xpress SARS-CoV-2/FLU/RSV plus assay is intended as an aid in the diagnosis of influenza from Nasopharyngeal swab specimens and should not be used as a sole basis for treatment. Nasal washings and aspirates are unacceptable for Xpert Xpress SARS-CoV-2/FLU/RSV testing.  Fact Sheet for Patients: EntrepreneurPulse.com.au  Fact Sheet for Healthcare Providers: IncredibleEmployment.be  This test is not yet approved or cleared by the [place] FDA and has been authorized for detection and/or diagnosis of SARS-CoV-2 by FDA under an Emergency Use Authorization (EUA). This EUA will remain in effect (meaning this test can be used) for the duration of the COVID-19 declaration under Section 564(b)(1) of the Act, 21 U.S.C. section 360bbb-3(b)(1), unless the authorization is terminated or revoked.  Performed at [facility], [address] [place], [place] [address]   Blood Culture (routine x 2)     Status: None (Preliminary result)   Collection Time: 03/21/21  7:15 PM   Specimen: Left Antecubital; Blood  Result Value Ref Range Status   Specimen  Description   Final    LEFT ANTECUBITAL Performed at [facility], [address] [place], [place] [address]     Special Requests   Final    BOTTLES DRAWN AEROBIC AND ANAEROBIC Blood Culture adequate volume Performed at [facility], [address] [place], [place] [address]    Culture  Setup Time   Final    GRAM NEGATIVE RODS IN BOTH AEROBIC AND ANAEROBIC BOTTLES CRITICAL RESULT CALLED TO, READ BACK BY AND VERIFIED WITHAndres Shad PHARMD 1407 03/22/21 A BROWNING    Culture   Final    GRAM NEGATIVE RODS IDENTIFICATION TO FOLLOW Performed at [facility], [place] [address] [place], [place] [address]    Report Status PENDING  Incomplete  Blood Culture (routine x 2)     Status: Abnormal (Preliminary result)   Collection Time: 03/21/21  7:15 PM   Specimen: Right Antecubital; Blood  Result Value Ref Range Status   Specimen Description   Final    RIGHT ANTECUBITAL Performed at [facility], [address] [place], [place] [address]    Special Requests   Final    BOTTLES DRAWN AEROBIC AND ANAEROBIC Blood Culture adequate volume Performed at [facility], [address] [place], [place] [address]    Culture  Setup Time   Final    GRAM NEGATIVE RODS IN BOTH AEROBIC AND ANAEROBIC BOTTLES CRITICAL RESULT CALLED TO, READ BACK BY AND VERIFIED WITHAndres Shad PHARMD 1407 03/22/21 A BROWNING    Culture (A)  Final    ESCHERICHIA COLI SUSCEPTIBILITIES TO FOLLOW Performed at [facility], [place] [address] [place], [place] [address]    Report Status PENDING  Incomplete  Blood Culture ID Panel (Reflexed)     Status: Abnormal   Collection Time: 03/21/21  7:15 PM  Result Value Ref Range Status   Enterococcus faecalis NOT DETECTED NOT DETECTED Final   Enterococcus Faecium NOT DETECTED NOT DETECTED Final   Listeria monocytogenes NOT DETECTED NOT DETECTED Final   Staphylococcus species NOT DETECTED NOT DETECTED Final   Staphylococcus aureus (BCID) NOT DETECTED NOT DETECTED Final   Staphylococcus epidermidis NOT DETECTED NOT DETECTED Final   Staphylococcus lugdunensis  NOT DETECTED NOT DETECTED Final   Streptococcus species NOT DETECTED NOT DETECTED Final   Streptococcus agalactiae NOT DETECTED NOT DETECTED Final   Streptococcus pneumoniae NOT DETECTED NOT DETECTED Final   Streptococcus pyogenes NOT DETECTED NOT DETECTED Final   A.calcoaceticus-baumannii NOT DETECTED NOT DETECTED Final   Bacteroides fragilis NOT DETECTED NOT DETECTED Final   Enterobacterales DETECTED (A) NOT DETECTED Final    Comment: Enterobacterales represent a large order of gram negative bacteria, not a single organism. CRITICAL RESULT CALLED TO, READ BACK BY AND VERIFIED WITH: Andres Shad PHARMD 1407 03/22/21 A BROWNING    Enterobacter cloacae complex NOT DETECTED NOT DETECTED Final   Escherichia coli DETECTED (A) NOT DETECTED Final    Comment: CRITICAL RESULT CALLED TO, READ BACK BY AND VERIFIED WITH: Andres Shad PHARMD 1407 03/22/21 A BROWNING    Klebsiella aerogenes NOT DETECTED NOT DETECTED Final   Klebsiella oxytoca NOT DETECTED NOT DETECTED Final   Klebsiella pneumoniae NOT DETECTED NOT DETECTED Final   Proteus species NOT DETECTED NOT DETECTED Final   Salmonella species NOT DETECTED NOT DETECTED Final   Serratia marcescens NOT DETECTED NOT DETECTED Final   Haemophilus influenzae NOT DETECTED NOT DETECTED Final   Neisseria meningitidis NOT DETECTED  NOT DETECTED Final   Pseudomonas aeruginosa NOT DETECTED NOT DETECTED Final   Stenotrophomonas maltophilia NOT DETECTED NOT DETECTED Final   Candida albicans NOT DETECTED NOT DETECTED Final   Candida auris NOT DETECTED NOT DETECTED Final   Candida glabrata NOT DETECTED NOT DETECTED Final   Candida krusei NOT DETECTED NOT DETECTED Final   Candida parapsilosis NOT DETECTED NOT DETECTED Final   Candida tropicalis NOT DETECTED NOT DETECTED Final   Cryptococcus neoformans/gattii NOT DETECTED NOT DETECTED Final   CTX-M ESBL NOT DETECTED NOT DETECTED Final   Carbapenem resistance IMP NOT DETECTED NOT DETECTED Final   Carbapenem resistance  KPC NOT DETECTED NOT DETECTED Final   Carbapenem resistance NDM NOT DETECTED NOT DETECTED Final   Carbapenem resist OXA 48 LIKE NOT DETECTED NOT DETECTED Final   Carbapenem resistance VIM NOT DETECTED NOT DETECTED Final    Comment: Performed at [facility], [address] [place], [place] [address]  Urine Culture     Status: None   Collection Time: 03/22/21  2:07 PM   Specimen: In/Out Cath Urine  Result Value Ref Range Status   Specimen Description IN/OUT CATH URINE  Final   Special Requests NONE  Final   Culture   Final    NO GROWTH Performed at [facility], [place] [address] [place], [place] [address]    Report Status 03/23/2021 FINAL  Final  MRSA Next Gen by PCR, Nasal     Status: None   Collection Time: 03/22/21  3:00 PM  Result Value Ref Range Status   MRSA by PCR Next Gen NOT DETECTED NOT DETECTED Final    Comment: (NOTE) The GeneXpert MRSA Assay (FDA approved for NASAL specimens only), is one component of a comprehensive MRSA colonization surveillance program. It is not intended to diagnose MRSA infection nor to guide or monitor treatment for MRSA infections. Test performance is not FDA approved in patients less than 64 years old. Performed at [facility], [place] [address], [place], [place] [address]     RN Pressure Injury Documentation:     Estimated body mass index is 23.63 kg/m as calculated from the following:   Height as of this encounter: 5\' 2"  (1.575 m).   Weight as of this encounter: 58.6 kg.  Malnutrition Type:   Malnutrition Characteristics:   Nutrition Interventions:    Radiology Studies: CT Chest Wo Contrast  Result Date: 03/21/2021 CLINICAL DATA:  Respiratory illness EXAM: CT CHEST WITHOUT CONTRAST TECHNIQUE: Multidetector CT imaging of the chest was performed following the standard protocol without IV contrast. COMPARISON:  Chest x-ray 03/21/2021, chest CT 11/04/2017 FINDINGS: Cardiovascular: Limited evaluation without  intravenous contrast. Partially visualized intracardiac pacing leads. Cardiomegaly with coronary vascular calcifications. No pericardial effusion. Moderate aortic atherosclerosis. No aneurysm Mediastinum/Nodes: Midline trachea. No thyroid mass. Multiple small left supraclavicular lymph nodes. Slightly enlarged precarinal lymph node measuring 13 mm. Esophagus within normal limits. Lungs/Pleura: Mild emphysema. Mild subpleural reticulation likely due to fibrosis. Bandlike density in the left lung base consistent with atelectasis or scar. No acute consolidation, pleural effusion or pneumothorax. Upper Abdomen: No acute abnormality. Musculoskeletal: No chest wall mass or suspicious bone lesions identified. IMPRESSION: 1. Negative for acute airspace disease or pneumonia 2. Mild emphysema. Mild subpleural reticulation, consistent with mild degree of fibrosis. 3. Cardiomegaly 4. Mild mediastinal lymph nodes which are nonspecific and may be reactive. Aortic Atherosclerosis (ICD10-I70.0) and Emphysema (ICD10-J43.9). Electronically Signed   By: Donavan Foil M.D.   On: 03/21/2021 22:12  US RENAL  Result Date: 03/23/2021 CLINICAL DATA:  Acute kidney injury. EXAM: RENAL / URINARY TRACT ULTRASOUND COMPLETE COMPARISON:  March 21, 2004 FINDINGS: Right Kidney: Renal measurements: 12.9 cm x 5.1 cm x 4.2 cm = volume: 145.59 mL. Echogenicity within normal limits. No mass or hydronephrosis visualized. Left Kidney: Renal measurements: 13.3 cm x 4.7 cm x 4.5 cm = volume: 147.69 mL. Echogenicity within normal limits. No mass or hydronephrosis visualized. Bladder: Appears normal for degree of bladder distention. Other: None. IMPRESSION: Normal renal ultrasound. Electronically Signed   By: Virgina Norfolk M.D.   On: 03/23/2021 01:49   ECHOCARDIOGRAM COMPLETE  Result Date: 03/22/2021    ECHOCARDIOGRAM REPORT   Patient Name:   EVANNA WASHINTON Date of Exam: 03/22/2021 Medical Rec #:  [id number]    Height:       62.0 in Accession #:     [id number]   Weight:       134.9 lb Date of Birth:  [birth year]    BSA:          1.617 m Patient Age:    50 years     BP:           117/60 mmHg Patient Gender: F            HR:           73 bpm. Exam Location:  Inpatient Procedure: 2D Echo, 3D Echo, Cardiac Doppler and Color Doppler Indications:    I50.40* Unspecified combined systolic (congestive) and diastolic                 (congestive) heart failure  History:        Patient has prior history of Echocardiogram examinations, most                 recent 03/09/2020. CHF, Abnormal ECG and Pacemaker, COPD,                 Arrythmias:Bradycardia; Risk Factors:Current Smoker and                 Hypertension.  Sonographer:    Roseanna Rainbow RDCS Referring Phys: [id number] ASIA B [place]  Sonographer Comments: Technically difficult study due to poor echo windows. IMPRESSIONS  1. Left ventricular ejection fraction, by estimation, is 70 to 75%. The left ventricle has hyperdynamic function. The left ventricle has no regional wall motion abnormalities. There is mild left ventricular hypertrophy. Left ventricular diastolic parameters are consistent with Grade II diastolic dysfunction (pseudonormalization). Elevated left atrial pressure.  2. Right ventricular systolic function is normal. The right ventricular size is normal. There is mildly elevated pulmonary artery systolic pressure.  3. Left atrial size was mildly dilated.  4. The mitral valve is normal in structure. Mild mitral valve regurgitation. No evidence of mitral stenosis.  5. The aortic valve is tricuspid. Aortic valve regurgitation is not visualized. Aortic valve sclerosis/calcification is present, without any evidence of aortic stenosis.  6. The inferior vena cava is normal in size with greater than 50% respiratory variability, suggesting right atrial pressure of 3 mmHg. Comparison(s): No significant change from prior study. FINDINGS  Left Ventricle: Left ventricular ejection fraction, by estimation, is 70 to 75%. The  left ventricle has hyperdynamic function. The left ventricle has no regional wall motion abnormalities. The left ventricular internal cavity size was normal in size. There is mild left ventricular hypertrophy. Left ventricular diastolic parameters are consistent with Grade II diastolic dysfunction (pseudonormalization). Elevated left atrial pressure. Right Ventricle: The  right ventricular size is normal. Right ventricular systolic function is normal. There is mildly elevated pulmonary artery systolic pressure. The tricuspid regurgitant velocity is 3.18 m/s, and with an assumed right atrial pressure of 3 mmHg, the estimated right ventricular systolic pressure is 83.3 mmHg. Left Atrium: Left atrial size was mildly dilated. Right Atrium: Right atrial size was normal in size. Pericardium: There is no evidence of pericardial effusion. Mitral Valve: The mitral valve is normal in structure. Mild mitral annular calcification. Mild mitral valve regurgitation. No evidence of mitral valve stenosis. MV peak gradient, 12.0 mmHg. The mean mitral valve gradient is 3.0 mmHg. Tricuspid Valve: The tricuspid valve is normal in structure. Tricuspid valve regurgitation is mild . No evidence of tricuspid stenosis. Aortic Valve: The aortic valve is tricuspid. Aortic valve regurgitation is not visualized. Aortic valve sclerosis/calcification is present, without any evidence of aortic stenosis. Pulmonic Valve: The pulmonic valve was normal in structure. Pulmonic valve regurgitation is trivial. No evidence of pulmonic stenosis. Aorta: The aortic root is normal in size and structure. Venous: The inferior vena cava is normal in size with greater than 50% respiratory variability, suggesting right atrial pressure of 3 mmHg. IAS/Shunts: No atrial level shunt detected by color flow Doppler. Additional Comments: A device lead is visualized.  LEFT VENTRICLE PLAX 2D LVIDd:         3.70 cm     Diastology LVIDs:         2.50 cm     LV e' medial:     4.95 cm/s LV PW:         1.70 cm     LV E/e' medial:  29.7 LV IVS:        1.20 cm     LV e' lateral:   7.95 cm/s LVOT diam:     2.00 cm     LV E/e' lateral: 18.5 LV SV:         83 LV SV Index:   51 LVOT Area:     3.14 cm                             3D Volume EF: LV Volumes (MOD)           3D EF:        79 % LV vol d, MOD A2C: 59.3 ml LV EDV:       97 ml LV vol d, MOD A4C: 30.4 ml LV ESV:       20 ml LV vol s, MOD A2C: 22.4 ml LV SV:        77 ml LV vol s, MOD A4C: 9.2 ml LV SV MOD A2C:     36.9 ml LV SV MOD A4C:     30.4 ml LV SV MOD BP:      30.5 ml RIGHT VENTRICLE             IVC RV S prime:     14.80 cm/s  IVC diam: 1.80 cm TAPSE (M-mode): 1.8 cm LEFT ATRIUM             Index        RIGHT ATRIUM           Index LA diam:        4.60 cm 2.84 cm/m   RA Area:     12.20 cm LA Vol (A2C):   60.9 ml 37.66 ml/m  RA Volume:   27.30 ml  16.88 ml/m LA Vol (A4C):   41.0 ml 25.35 ml/m LA Biplane Vol: 52.9 ml 32.71 ml/m  AORTIC VALVE             PULMONIC VALVE LVOT Vmax:   137.00 cm/s PR End Diast Vel: 1.81 msec LVOT Vmean:  86.200 cm/s LVOT VTI:    0.263 m  AORTA Ao Root diam: 3.30 cm Ao Asc diam:  3.10 cm MITRAL VALVE                TRICUSPID VALVE MV Area (PHT): 3.27 cm     TR Peak grad:   40.4 mmHg MV Area VTI:   1.79 cm     TR Vmax:        318.00 cm/s MV Peak grad:  12.0 mmHg MV Mean grad:  3.0 mmHg     SHUNTS MV Vmax:       1.73 m/s     Systemic VTI:  0.26 m MV Vmean:      78.4 cm/s    Systemic Diam: 2.00 cm MV Decel Time: 232 msec MV E velocity: 147.00 cm/s MV A velocity: 76.80 cm/s MV E/A ratio:  1.91 Kirk Ruths MD Electronically signed by Kirk Ruths MD Signature Date/Time: 03/22/2021/12:34:46 PM    Final     Scheduled Meds:  aspirin EC  81 mg Oral Daily   folic acid  1 mg Oral Daily   heparin  850 Units Intravenous Once   ipratropium  0.5 mg Nebulization Q6H   levalbuterol  0.63 mg Nebulization Q6H   Continuous Infusions:  sodium chloride Stopped (03/22/21 0037)   albumin human 25 g (03/23/21  1802)   amiodarone 60 mg/hr (03/23/21 1312)   cefTRIAXone (ROCEPHIN)  IV 2 g (03/23/21 1808)   heparin 1,100 Units/hr (03/23/21 1007)    LOS: 1 day   Kerney Elbe, DO [facility] PAGER is on AMION  If 7PM-7AM, please contact night-coverage [email or web address]

## 2021-03-23 NOTE — Progress Notes (Signed)
[facility] NEPHROLOGY PROGRESS NOTE  Assessment/ Plan:  Pt is a 70 y.o. yo female  with COPD, IBS, ischemic colitis, SLE admitted 12/1 for sepsis 2/2 E. Coli bacteremia, community acquired pneumonia, acute on chronic heart failure, and found to have acute hyponatremia and acute renal failure.   #Acute on ?chronic hyponatremia - Na 122 this AM from 113 on admission. Two days prior to admission Na 121. Prior to this, last known Na 139 in January of this year. 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 - Continue holding lasix, could benefit from IVF - Goal Na 126-128 in next 24hr - BMP q12hr  #Non-oliguric acute renal failure - Possibly ATN in setting of multiple nephrotoxic medications and hypotension 2/2 infection - Renal ultrasound yesterday without obstruction - Renal function stable from yesterday, GFR 26<25 - Continues to have good UOP, 1.5L yesterday - Patient does appear mildly dry on exam despite rales present, can consider gentle hydration today - Expect renal function and hyponatremia to improve with improvement of infection - No acute indication for dialysis at this time - Continue strict I/O, daily weights  #Acute on chronic heart failure - Rales present on exam although patient appears dry otherwise - Weight down 58.6kg < 61.5kg from admission - Could be contributing to renal failure, but given clinical picture today more likely due to hypotension and nephrotoxic medications - Would continue holding lasix and ARB until renal function improves - Management per primary, cardiology  #Community-acquired pneumonia   #E. Coli bacteremia (2/4 bottles) - Would avoid nephrotoxic antibiotics if possible - Management per primary  #Acute metabolic encephalopathy - Multi-factorial with pneumonia/E. Coli bacteremia (2/4 bottles) and severe hyponatremia - Expect improvement with antibiotics  #Atrial fibrillation with RVR - Continues  to be in Afib with RVR this morning on amiodarone - Management per cardiology, primary  Subjective:    Patient evaluated at bedside this morning accompanied by her husband. He reports she appears similar to yesterday given her mental status. Also mentions she did not eat well this morning. Sheila Morales is able to answer some questions, mentions she feels okay but very tired.   Objective Vital signs in last 24 hours: Vitals:   03/23/21 0100 03/23/21 0415 03/23/21 0546 03/23/21 0727  BP:  (!) 134/95  (!) 126/93  Pulse:    (!) 139  Resp:  20  (!) 24  Temp:  98.8 F (37.1 C)    TempSrc:  Oral    SpO2: 96% 98%  98%  Weight:   58.6 kg   Height:       Weight change: -2.908 kg  Intake/Output Summary (Last 24 hours) at 03/23/2021 0917 Last data filed at 03/23/2021 0802 Gross per 24 hour  Intake 2942.65 ml  Output 1750 ml  Net 1192.65 ml   Labs: Basic Metabolic Panel: Recent Labs  Lab 03/22/21 1431 03/22/21 1939 03/23/21 0039  NA 124* 121* 122*  K 4.0 4.0 3.7  CL 91* 89* 89*  CO2 21* 20* 20*  GLUCOSE 111* 102* 96  BUN 32* 35* 35*  CREATININE 2.11* 2.07* 2.03*  CALCIUM 8.9 8.2* 8.3*  PHOS 4.4  --  3.7   Liver Function Tests: Recent Labs  Lab 03/21/21 1915 03/22/21 1431 03/23/21 0039  AST 42* 42* 31  ALT 26 26 23   ALKPHOS 115 122 118  BILITOT 0.9 1.0 0.5  PROT 7.3 6.9 6.4*  ALBUMIN 3.6 3.0* 2.7*   No results for input(s): LIPASE, AMYLASE in the  last 168 hours. Recent Labs  Lab 03/21/21 1915  AMMONIA 11   CBC: Recent Labs  Lab 03/19/21 1226 03/21/21 1915 03/21/21 2118 03/22/21 0648 03/22/21 1431 03/23/21 0039  WBC 20.9* 24.8*  --  19.4* 19.1* 16.1*  NEUTROABS 18.4* 21.9*  --   --  16.4* 13.0*  HGB 12.0 12.1   < > 11.4* 12.8 11.9*  HCT 34.6* 33.8*   < > 31.6* 36.6 32.8*  MCV 94.8 90.6  --  89.8 92.2 89.9  PLT 159 152  --  141* 153 169   < > = values in this interval not displayed.   Cardiac Enzymes: No results for input(s): CKTOTAL, CKMB, CKMBINDEX,  TROPONINI in the last 168 hours. CBG: Recent Labs  Lab 03/21/21 1912 03/22/21 1316 03/22/21 1332 03/22/21 1611 03/23/21 0014  GLUCAP 82 50* 213* 118* 100*    Iron Studies: No results for input(s): IRON, TIBC, TRANSFERRIN, FERRITIN in the last 72 hours. Studies/Results: CT Chest Wo Contrast  Result Date: 03/21/2021 CLINICAL DATA:  Respiratory illness EXAM: CT CHEST WITHOUT CONTRAST TECHNIQUE: Multidetector CT imaging of the chest was performed following the standard protocol without IV contrast. COMPARISON:  Chest x-ray 03/21/2021, chest CT 11/04/2017 FINDINGS: Cardiovascular: Limited evaluation without intravenous contrast. Partially visualized intracardiac pacing leads. Cardiomegaly with coronary vascular calcifications. No pericardial effusion. Moderate aortic atherosclerosis. No aneurysm Mediastinum/Nodes: Midline trachea. No thyroid mass. Multiple small left supraclavicular lymph nodes. Slightly enlarged precarinal lymph node measuring 13 mm. Esophagus within normal limits. Lungs/Pleura: Mild emphysema. Mild subpleural reticulation likely due to fibrosis. Bandlike density in the left lung base consistent with atelectasis or scar. No acute consolidation, pleural effusion or pneumothorax. Upper Abdomen: No acute abnormality. Musculoskeletal: No chest wall mass or suspicious bone lesions identified. IMPRESSION: 1. Negative for acute airspace disease or pneumonia 2. Mild emphysema. Mild subpleural reticulation, consistent with mild degree of fibrosis. 3. Cardiomegaly 4. Mild mediastinal lymph nodes which are nonspecific and may be reactive. Aortic Atherosclerosis (ICD10-I70.0) and Emphysema (ICD10-J43.9). Electronically Signed   By: Donavan Foil M.D.   On: 03/21/2021 22:12   US RENAL  Result Date: 03/23/2021 CLINICAL DATA:  Acute kidney injury. EXAM: RENAL / URINARY TRACT ULTRASOUND COMPLETE COMPARISON:  March 21, 2004 FINDINGS: Right Kidney: Renal measurements: 12.9 cm x 5.1 cm x 4.2 cm =  volume: 145.59 mL. Echogenicity within normal limits. No mass or hydronephrosis visualized. Left Kidney: Renal measurements: 13.3 cm x 4.7 cm x 4.5 cm = volume: 147.69 mL. Echogenicity within normal limits. No mass or hydronephrosis visualized. Bladder: Appears normal for degree of bladder distention. Other: None. IMPRESSION: Normal renal ultrasound. Electronically Signed   By: Virgina Norfolk M.D.   On: 03/23/2021 01:49   DG Chest Port 1 View  Result Date: 03/21/2021 CLINICAL DATA:  Cough and altered mental status. EXAM: PORTABLE CHEST 1 VIEW COMPARISON:  March 19, 2021 FINDINGS: There is a dual lead AICD. Diffuse, chronic appearing increased interstitial lung markings are noted. This is stable in appearance when compared to the prior exam. Mild, stable bibasilar atelectasis is noted. There is no evidence of a pleural effusion or pneumothorax. The heart size and mediastinal contours are within normal limits. The visualized skeletal structures are unremarkable. IMPRESSION: 1. Diffuse, chronic appearing increased interstitial lung markings, stable in appearance when compared to the prior exam. 2. Mild, stable bibasilar atelectasis. Electronically Signed   By: Virgina Norfolk M.D.   On: 03/21/2021 19:27   ECHOCARDIOGRAM COMPLETE  Result Date: 03/22/2021    ECHOCARDIOGRAM REPORT  Patient Name:   Sheila Morales Date of Exam: 03/22/2021 Medical Rec #:  [id number]    Height:       62.0 in Accession #:    [id number]   Weight:       134.9 lb Date of Birth:  1950-09-30    BSA:          1.617 m Patient Age:    71 years     BP:           117/60 mmHg Patient Gender: F            HR:           73 bpm. Exam Location:  Inpatient Procedure: 2D Echo, 3D Echo, Cardiac Doppler and Color Doppler Indications:    I50.40* Unspecified combined systolic (congestive) and diastolic                 (congestive) heart failure  History:        Patient has prior history of Echocardiogram examinations, most                 recent  03/09/2020. CHF, Abnormal ECG and Pacemaker, COPD,                 Arrythmias:Bradycardia; Risk Factors:Current Smoker and                 Hypertension.  Sonographer:    Roseanna Rainbow RDCS Referring Phys: [id number] ASIA B [place]  Sonographer Comments: Technically difficult study due to poor echo windows. IMPRESSIONS  1. Left ventricular ejection fraction, by estimation, is 70 to 75%. The left ventricle has hyperdynamic function. The left ventricle has no regional wall motion abnormalities. There is mild left ventricular hypertrophy. Left ventricular diastolic parameters are consistent with Grade II diastolic dysfunction (pseudonormalization). Elevated left atrial pressure.  2. Right ventricular systolic function is normal. The right ventricular size is normal. There is mildly elevated pulmonary artery systolic pressure.  3. Left atrial size was mildly dilated.  4. The mitral valve is normal in structure. Mild mitral valve regurgitation. No evidence of mitral stenosis.  5. The aortic valve is tricuspid. Aortic valve regurgitation is not visualized. Aortic valve sclerosis/calcification is present, without any evidence of aortic stenosis.  6. The inferior vena cava is normal in size with greater than 50% respiratory variability, suggesting right atrial pressure of 3 mmHg. Comparison(s): No significant change from prior study. FINDINGS  Left Ventricle: Left ventricular ejection fraction, by estimation, is 70 to 75%. The left ventricle has hyperdynamic function. The left ventricle has no regional wall motion abnormalities. The left ventricular internal cavity size was normal in size. There is mild left ventricular hypertrophy. Left ventricular diastolic parameters are consistent with Grade II diastolic dysfunction (pseudonormalization). Elevated left atrial pressure. Right Ventricle: The right ventricular size is normal. Right ventricular systolic function is normal. There is mildly elevated pulmonary artery systolic  pressure. The tricuspid regurgitant velocity is 3.18 m/s, and with an assumed right atrial pressure of 3 mmHg, the estimated right ventricular systolic pressure is 20.9 mmHg. Left Atrium: Left atrial size was mildly dilated. Right Atrium: Right atrial size was normal in size. Pericardium: There is no evidence of pericardial effusion. Mitral Valve: The mitral valve is normal in structure. Mild mitral annular calcification. Mild mitral valve regurgitation. No evidence of mitral valve stenosis. MV peak gradient, 12.0 mmHg. The mean mitral valve gradient is 3.0 mmHg. Tricuspid Valve: The tricuspid valve is normal in structure. Tricuspid valve regurgitation  is mild . No evidence of tricuspid stenosis. Aortic Valve: The aortic valve is tricuspid. Aortic valve regurgitation is not visualized. Aortic valve sclerosis/calcification is present, without any evidence of aortic stenosis. Pulmonic Valve: The pulmonic valve was normal in structure. Pulmonic valve regurgitation is trivial. No evidence of pulmonic stenosis. Aorta: The aortic root is normal in size and structure. Venous: The inferior vena cava is normal in size with greater than 50% respiratory variability, suggesting right atrial pressure of 3 mmHg. IAS/Shunts: No atrial level shunt detected by color flow Doppler. Additional Comments: A device lead is visualized.  LEFT VENTRICLE PLAX 2D LVIDd:         3.70 cm     Diastology LVIDs:         2.50 cm     LV e' medial:    4.95 cm/s LV PW:         1.70 cm     LV E/e' medial:  29.7 LV IVS:        1.20 cm     LV e' lateral:   7.95 cm/s LVOT diam:     2.00 cm     LV E/e' lateral: 18.5 LV SV:         83 LV SV Index:   51 LVOT Area:     3.14 cm                             3D Volume EF: LV Volumes (MOD)           3D EF:        79 % LV vol d, MOD A2C: 59.3 ml LV EDV:       97 ml LV vol d, MOD A4C: 30.4 ml LV ESV:       20 ml LV vol s, MOD A2C: 22.4 ml LV SV:        77 ml LV vol s, MOD A4C: 9.2 ml LV SV MOD A2C:     36.9 ml LV SV  MOD A4C:     30.4 ml LV SV MOD BP:      30.5 ml RIGHT VENTRICLE             IVC RV S prime:     14.80 cm/s  IVC diam: 1.80 cm TAPSE (M-mode): 1.8 cm LEFT ATRIUM             Index        RIGHT ATRIUM           Index LA diam:        4.60 cm 2.84 cm/m   RA Area:     12.20 cm LA Vol (A2C):   60.9 ml 37.66 ml/m  RA Volume:   27.30 ml  16.88 ml/m LA Vol (A4C):   41.0 ml 25.35 ml/m LA Biplane Vol: 52.9 ml 32.71 ml/m  AORTIC VALVE             PULMONIC VALVE LVOT Vmax:   137.00 cm/s PR End Diast Vel: 1.81 msec LVOT Vmean:  86.200 cm/s LVOT VTI:    0.263 m  AORTA Ao Root diam: 3.30 cm Ao Asc diam:  3.10 cm MITRAL VALVE                TRICUSPID VALVE MV Area (PHT): 3.27 cm     TR Peak grad:   40.4 mmHg MV Area VTI:   1.79 cm  TR Vmax:        318.00 cm/s MV Peak grad:  12.0 mmHg MV Mean grad:  3.0 mmHg     SHUNTS MV Vmax:       1.73 m/s     Systemic VTI:  0.26 m MV Vmean:      78.4 cm/s    Systemic Diam: 2.00 cm MV Decel Time: 232 msec MV E velocity: 147.00 cm/s MV A velocity: 76.80 cm/s MV E/A ratio:  1.91 Kirk Ruths MD Electronically signed by Kirk Ruths MD Signature Date/Time: 03/22/2021/12:34:46 PM    Final     Medications: Infusions:  sodium chloride Stopped (03/22/21 0037)   amiodarone 60 mg/hr (03/23/21 0741)   cefTRIAXone (ROCEPHIN)  IV 2 g (03/22/21 1810)   heparin 950 Units/hr (03/23/21 0001)   magnesium sulfate bolus IVPB 2 g (03/23/21 0902)   vancomycin 750 mg (03/22/21 1740)    Scheduled Medications:  aspirin EC  81 mg Oral Daily   folic acid  1 mg Oral Daily   ipratropium  0.5 mg Nebulization Q6H   levalbuterol  0.63 mg Nebulization Q6H    have reviewed scheduled and prn medications.  Physical Exam: General: Resting in bed, appears uncomfortable, no acute distress Heart: Tachycardic, irregular rhythm. No murmurs appreciated Lungs: Normal work of breathing on HFNC. Bibasilar rales appreciated. Abdomen: Soft, non-tender, non-distended Extremities: No edema  bilaterally Neuro: Lethargic, responds to questions intermittently  Sanjuan Dame, MD Internal Medicine Resident PGY-2 03/23/2021,9:17 AM  LOS: 1 day

## 2021-03-23 NOTE — Progress Notes (Signed)
ANTICOAGULATION CONSULT NOTE   Pharmacy Consult for heparin Indication: atrial fibrillation  No Known Allergies  Patient Measurements: Height: 5\' 2"  (157.5 cm) Weight: 58.6 kg (129 lb 3 oz) IBW/kg (Calculated) : 50.1 Heparin Dosing Weight: 60kg  Vital Signs: BP: 123/87 (12/02 1000) Pulse Rate: 140 (12/02 1000)  Labs: Recent Labs    03/21/21 1915 03/21/21 2118 03/22/21 0648 03/22/21 1431 03/22/21 1939 03/22/21 2259 03/23/21 0039 03/23/21 0744 03/23/21 1744  HGB 12.1   < > 11.4* 12.8  --   --  11.9*  --   --   HCT 33.8*   < > 31.6* 36.6  --   --  32.8*  --   --   PLT 152  --  141* 153  --   --  169  --   --   APTT 31  --   --   --   --   --   --   --   --   LABPROT 14.3  --   --   --   --   --   --   --   --   INR 1.1  --   --   --   --   --   --   --   --   HEPARINUNFRC  --   --   --   --   --    < > <0.10* 0.18* 0.22*  CREATININE 1.86*  --  2.06* 2.11* 2.07*  --  2.03*  --   --    < > = values in this interval not displayed.     Estimated Creatinine Clearance: 20.1 mL/min (A) (by C-G formula based on SCr of 2.03 mg/dL (H)).   Assessment: 70 year old female admitted for altered mental status. Patient now with afib with rvr this afternoon. She was not on anticoagulation prior to admit and was receiving sq heparin for prophylaxis since admit. New orders to transition patient over to IV heparin. Hemoglobin has remained stable ~11 this admit, no bleeding issues have been noted.   Repeat heparin level trending up but still below goal at 0.22 on 1100 units/hr. No bleeding noted. No problems with infusion or bleeding per RN.  Goal of Therapy:  Heparin level 0.3-0.7 units/ml Monitor platelets by anticoagulation protocol: Yes   Plan:  Heparin 850 units re-bolus Increase heparin to 1250 units/hr Heparin level in 8 hours Monitor for s/sx of bleeding  Thank you for involving pharmacy in this patient's care.  Renold Genta, PharmD, BCPS Clinical Pharmacist Clinical  phone for 03/23/2021 until 10p is [phone] 03/23/2021 7:14 PM  **Pharmacist phone directory can be found on [email or web address] listed under [place]**

## 2021-03-23 NOTE — Progress Notes (Signed)
ANTICOAGULATION CONSULT NOTE   Pharmacy Consult for heparin Indication: atrial fibrillation  No Known Allergies  Patient Measurements: Height: 5\' 2"  (157.5 cm) Weight: 58.6 kg (129 lb 3 oz) IBW/kg (Calculated) : 50.1 Heparin Dosing Weight: 60kg  Vital Signs: Temp: 98.8 F (37.1 C) (12/02 0415) Temp Source: Oral (12/02 0415) BP: 126/93 (12/02 0727) Pulse Rate: 139 (12/02 0727)  Labs: Recent Labs    03/21/21 1915 03/21/21 2118 03/22/21 0648 03/22/21 1431 03/22/21 1939 03/22/21 2259 03/23/21 0039  HGB 12.1   < > 11.4* 12.8  --   --  11.9*  HCT 33.8*   < > 31.6* 36.6  --   --  32.8*  PLT 152  --  141* 153  --   --  169  APTT 31  --   --   --   --   --   --   LABPROT 14.3  --   --   --   --   --   --   INR 1.1  --   --   --   --   --   --   HEPARINUNFRC  --   --   --   --   --  <0.10* <0.10*  CREATININE 1.86*  --  2.06* 2.11* 2.07*  --  2.03*   < > = values in this interval not displayed.     Estimated Creatinine Clearance: 20.1 mL/min (A) (by C-G formula based on SCr of 2.03 mg/dL (H)).   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 Patient now with afib with rvr this afternoon. She was not on anticoagulation prior to admit and was receiving sq heparin for prophylaxis since admit. New orders to transition patient over to IV heparin. Hemoglobin has remained stable ~11 this admit, no bleeding issues  have been noted.   Repeat heparin level trending up but still below goal at 0.18 on 950 units/hr. Hgb stable 11.9, plt ok 169.   Goal of Therapy:  Heparin level 0.3-0.7 units/ml Monitor platelets by anticoagulation protocol: Yes   Plan:  Heparin 2000 units re-bolus Increase heparin to 1100 units/hr Heparin level in 8 hours  Erin Hearing PharmD., BCPS Clinical Pharmacist 03/23/2021 7:51 AM

## 2021-03-24 ENCOUNTER — Inpatient Hospital Stay (HOSPITAL_COMMUNITY): Payer: PPO

## 2021-03-24 DIAGNOSIS — I5031 Acute diastolic (congestive) heart failure: Secondary | ICD-10-CM

## 2021-03-24 DIAGNOSIS — I4819 Other persistent atrial fibrillation: Secondary | ICD-10-CM | POA: Diagnosis not present

## 2021-03-24 DIAGNOSIS — N179 Acute kidney failure, unspecified: Secondary | ICD-10-CM

## 2021-03-24 DIAGNOSIS — G9341 Metabolic encephalopathy: Secondary | ICD-10-CM

## 2021-03-24 DIAGNOSIS — R7881 Bacteremia: Secondary | ICD-10-CM

## 2021-03-24 DIAGNOSIS — J432 Centrilobular emphysema: Secondary | ICD-10-CM | POA: Diagnosis not present

## 2021-03-24 DIAGNOSIS — B9620 Unspecified Escherichia coli [E. coli] as the cause of diseases classified elsewhere: Secondary | ICD-10-CM

## 2021-03-24 DIAGNOSIS — E871 Hypo-osmolality and hyponatremia: Secondary | ICD-10-CM

## 2021-03-24 DIAGNOSIS — I4891 Unspecified atrial fibrillation: Secondary | ICD-10-CM

## 2021-03-24 LAB — CBC WITH DIFFERENTIAL/PLATELET
Abs Immature Granulocytes: 0.21 10*3/uL — ABNORMAL HIGH (ref 0.00–0.07)
Abs Immature Granulocytes: 0.28 10*3/uL — ABNORMAL HIGH (ref 0.00–0.07)
Basophils Absolute: 0 10*3/uL (ref 0.0–0.1)
Basophils Absolute: 0 10*3/uL (ref 0.0–0.1)
Basophils Relative: 0 %
Basophils Relative: 0 %
Eosinophils Absolute: 0 10*3/uL (ref 0.0–0.5)
Eosinophils Absolute: 0 10*3/uL (ref 0.0–0.5)
Eosinophils Relative: 0 %
Eosinophils Relative: 0 %
HCT: 29.3 % — ABNORMAL LOW (ref 36.0–46.0)
HCT: 29.6 % — ABNORMAL LOW (ref 36.0–46.0)
Hemoglobin: 10.7 g/dL — ABNORMAL LOW (ref 12.0–15.0)
Hemoglobin: 10.8 g/dL — ABNORMAL LOW (ref 12.0–15.0)
Immature Granulocytes: 1 %
Immature Granulocytes: 2 %
Lymphocytes Relative: 5 %
Lymphocytes Relative: 5 %
Lymphs Abs: 0.7 10*3/uL (ref 0.7–4.0)
Lymphs Abs: 0.8 10*3/uL (ref 0.7–4.0)
MCH: 31.8 pg (ref 26.0–34.0)
MCH: 32.2 pg (ref 26.0–34.0)
MCHC: 36.1 g/dL — ABNORMAL HIGH (ref 30.0–36.0)
MCHC: 36.9 g/dL — ABNORMAL HIGH (ref 30.0–36.0)
MCV: 87.5 fL (ref 80.0–100.0)
MCV: 87.8 fL (ref 80.0–100.0)
Monocytes Absolute: 2.5 10*3/uL — ABNORMAL HIGH (ref 0.1–1.0)
Monocytes Absolute: 2.6 10*3/uL — ABNORMAL HIGH (ref 0.1–1.0)
Monocytes Relative: 16 %
Monocytes Relative: 17 %
Neutro Abs: 11.4 10*3/uL — ABNORMAL HIGH (ref 1.7–7.7)
Neutro Abs: 12 10*3/uL — ABNORMAL HIGH (ref 1.7–7.7)
Neutrophils Relative %: 76 %
Neutrophils Relative %: 78 %
Platelets: 167 10*3/uL (ref 150–400)
Platelets: 168 10*3/uL (ref 150–400)
RBC: 3.35 MIL/uL — ABNORMAL LOW (ref 3.87–5.11)
RBC: 3.37 MIL/uL — ABNORMAL LOW (ref 3.87–5.11)
RDW: 13.4 % (ref 11.5–15.5)
RDW: 13.7 % (ref 11.5–15.5)
WBC: 15.1 10*3/uL — ABNORMAL HIGH (ref 4.0–10.5)
WBC: 15.5 10*3/uL — ABNORMAL HIGH (ref 4.0–10.5)
nRBC: 0 % (ref 0.0–0.2)
nRBC: 0 % (ref 0.0–0.2)

## 2021-03-24 LAB — COMPREHENSIVE METABOLIC PANEL
ALT: 20 U/L (ref 0–44)
ALT: 18 U/L (ref 0–44)
AST: 27 U/L (ref 15–41)
AST: 26 U/L (ref 15–41)
Albumin: 3.4 g/dL — ABNORMAL LOW (ref 3.5–5.0)
Albumin: 3.6 g/dL (ref 3.5–5.0)
Alkaline Phosphatase: 90 U/L (ref 38–126)
Alkaline Phosphatase: 84 U/L (ref 38–126)
Anion gap: 11 (ref 5–15)
Anion gap: 14 (ref 5–15)
BUN: 28 mg/dL — ABNORMAL HIGH (ref 8–23)
BUN: 28 mg/dL — ABNORMAL HIGH (ref 8–23)
CO2: 21 mmol/L — ABNORMAL LOW (ref 22–32)
CO2: 20 mmol/L — ABNORMAL LOW (ref 22–32)
Calcium: 8.2 mg/dL — ABNORMAL LOW (ref 8.9–10.3)
Calcium: 8.6 mg/dL — ABNORMAL LOW (ref 8.9–10.3)
Chloride: 90 mmol/L — ABNORMAL LOW (ref 98–111)
Chloride: 91 mmol/L — ABNORMAL LOW (ref 98–111)
Creatinine, Ser: 1.6 mg/dL — ABNORMAL HIGH (ref 0.44–1.00)
Creatinine, Ser: 1.75 mg/dL — ABNORMAL HIGH (ref 0.44–1.00)
GFR, Estimated: 34 mL/min — ABNORMAL LOW (ref >60)
GFR, Estimated: 31 mL/min — ABNORMAL LOW (ref >60)
Glucose, Bld: 152 mg/dL — ABNORMAL HIGH (ref 70–99)
Glucose, Bld: 124 mg/dL — ABNORMAL HIGH (ref 70–99)
Potassium: 3.3 mmol/L — ABNORMAL LOW (ref 3.5–5.1)
Potassium: 4.2 mmol/L (ref 3.5–5.1)
Sodium: 122 mmol/L — ABNORMAL LOW (ref 135–145)
Sodium: 125 mmol/L — ABNORMAL LOW (ref 135–145)
Total Bilirubin: 0.3 mg/dL (ref 0.3–1.2)
Total Bilirubin: 0.6 mg/dL (ref 0.3–1.2)
Total Protein: 7 g/dL (ref 6.5–8.1)
Total Protein: 6.6 g/dL (ref 6.5–8.1)

## 2021-03-24 LAB — CULTURE, BLOOD (ROUTINE X 2)
Special Requests: ADEQUATE
Special Requests: ADEQUATE

## 2021-03-24 LAB — PHOSPHORUS
Phosphorus: 2.9 mg/dL (ref 2.5–4.6)
Phosphorus: 3.2 mg/dL (ref 2.5–4.6)

## 2021-03-24 LAB — HEPARIN LEVEL (UNFRACTIONATED)
Heparin Unfractionated: 0.26 IU/mL — ABNORMAL LOW (ref 0.30–0.70)
Heparin Unfractionated: 0.37 IU/mL (ref 0.30–0.70)

## 2021-03-24 LAB — BLOOD GAS, ARTERIAL
Acid-base deficit: 2.7 mmol/L — ABNORMAL HIGH (ref 0.0–2.0)
Bicarbonate: 21.6 mmol/L (ref 20.0–28.0)
Drawn by: 164
FIO2: 40
O2 Saturation: 94.4 %
Patient temperature: 37
pCO2 arterial: 37.1 mmHg (ref 32.0–48.0)
pH, Arterial: 7.382 (ref 7.350–7.450)
pO2, Arterial: 73.5 mmHg — ABNORMAL LOW (ref 83.0–108.0)

## 2021-03-24 LAB — GLUCOSE, CAPILLARY
Glucose-Capillary: 113 mg/dL — ABNORMAL HIGH (ref 70–99)
Glucose-Capillary: 119 mg/dL — ABNORMAL HIGH (ref 70–99)
Glucose-Capillary: 127 mg/dL — ABNORMAL HIGH (ref 70–99)
Glucose-Capillary: 117 mg/dL — ABNORMAL HIGH (ref 70–99)

## 2021-03-24 LAB — MAGNESIUM
Magnesium: 2.2 mg/dL (ref 1.7–2.4)
Magnesium: 2.1 mg/dL (ref 1.7–2.4)

## 2021-03-24 LAB — LACTIC ACID, PLASMA
Lactic Acid, Venous: 1 mmol/L (ref 0.5–1.9)
Lactic Acid, Venous: 1 mmol/L (ref 0.5–1.9)

## 2021-03-24 LAB — BRAIN NATRIURETIC PEPTIDE: B Natriuretic Peptide: 2707.6 pg/mL — ABNORMAL HIGH (ref 0.0–100.0)

## 2021-03-24 MED ORDER — DILTIAZEM HCL-DEXTROSE 125-5 MG/125ML-% IV SOLN (PREMIX)
5.0000 mg/h | INTRAVENOUS | Status: DC
  Administered 2021-03-24: 15 mg/h via INTRAVENOUS
  Administered 2021-03-24 – 2021-03-25 (×2): 5 mg/h via INTRAVENOUS
  Administered 2021-03-26: 10 mg/h via INTRAVENOUS
  Administered 2021-03-27: 15 mg/h via INTRAVENOUS
  Administered 2021-03-27 – 2021-03-29 (×5): 12.5 mg/h via INTRAVENOUS
  Administered 2021-03-29 – 2021-03-30 (×3): 15 mg/h via INTRAVENOUS
  Filled 2021-03-24 (×14): qty 125

## 2021-03-24 MED ORDER — POTASSIUM CHLORIDE CRYS ER 20 MEQ PO TBCR
20.0000 meq | EXTENDED_RELEASE_TABLET | Freq: Two times a day (BID) | ORAL | Status: DC
  Administered 2021-03-24: 20 meq via ORAL
  Filled 2021-03-24: qty 1

## 2021-03-24 MED ORDER — SODIUM CHLORIDE 0.9% FLUSH
10.0000 mL | Freq: Two times a day (BID) | INTRAVENOUS | Status: DC
  Administered 2021-03-24 – 2021-04-01 (×16): 10 mL

## 2021-03-24 MED ORDER — SODIUM CHLORIDE 0.9% FLUSH
10.0000 mL | INTRAVENOUS | Status: DC | PRN
  Administered 2021-03-25 – 2021-03-30 (×2): 10 mL

## 2021-03-24 MED ORDER — POTASSIUM CHLORIDE 10 MEQ/100ML IV SOLN
10.0000 meq | Freq: Once | INTRAVENOUS | Status: AC
  Administered 2021-03-24: 10 meq via INTRAVENOUS
  Filled 2021-03-24: qty 100

## 2021-03-24 MED ORDER — ORAL CARE MOUTH RINSE
15.0000 mL | Freq: Two times a day (BID) | OROMUCOSAL | Status: DC
  Administered 2021-03-24 – 2021-04-01 (×15): 15 mL via OROMUCOSAL

## 2021-03-24 MED ORDER — ADENOSINE 6 MG/2ML IV SOLN: 6.0000 mg | INTRAVENOUS | Status: DC | PRN

## 2021-03-24 MED ORDER — POTASSIUM CHLORIDE 10 MEQ/100ML IV SOLN
10.0000 meq | INTRAVENOUS | Status: AC
  Administered 2021-03-24 (×4): 10 meq via INTRAVENOUS
  Filled 2021-03-24 (×4): qty 100

## 2021-03-24 MED ORDER — LABETALOL HCL 5 MG/ML IV SOLN
10.0000 mg | INTRAVENOUS | Status: DC | PRN
  Administered 2021-03-24: 10 mg via INTRAVENOUS
  Filled 2021-03-24: qty 4

## 2021-03-24 MED ORDER — DILTIAZEM LOAD VIA INFUSION
10.0000 mg | Freq: Once | INTRAVENOUS | Status: AC
  Administered 2021-03-24: 10 mg via INTRAVENOUS
  Filled 2021-03-24: qty 10

## 2021-03-24 MED ORDER — METOPROLOL TARTRATE 5 MG/5ML IV SOLN
INTRAVENOUS | Status: AC
  Filled 2021-03-24: qty 5

## 2021-03-24 MED ORDER — CHLORHEXIDINE GLUCONATE CLOTH 2 % EX PADS
6.0000 | MEDICATED_PAD | Freq: Every day | CUTANEOUS | Status: DC
  Administered 2021-03-24 – 2021-03-28 (×6): 6 via TOPICAL

## 2021-03-24 MED ORDER — ADENOSINE 6 MG/2ML IV SOLN
INTRAVENOUS | Status: AC
  Filled 2021-03-24: qty 2

## 2021-03-24 MED ORDER — SODIUM CHLORIDE 1 G PO TABS
1.0000 g | ORAL_TABLET | Freq: Three times a day (TID) | ORAL | Status: DC
  Administered 2021-03-24 – 2021-03-27 (×11): 1 g via ORAL
  Filled 2021-03-24 (×13): qty 1

## 2021-03-24 MED ORDER — METOPROLOL TARTRATE 5 MG/5ML IV SOLN
5.0000 mg | Freq: Once | INTRAVENOUS | Status: AC
  Administered 2021-03-24: 5 mg via INTRAVENOUS
  Filled 2021-03-24: qty 5

## 2021-03-24 MED ORDER — HEPARIN BOLUS VIA INFUSION
1000.0000 [IU] | Freq: Once | INTRAVENOUS | Status: AC
  Administered 2021-03-24: 1000 [IU] via INTRAVENOUS
  Filled 2021-03-24: qty 1000

## 2021-03-24 NOTE — Progress Notes (Signed)
Progress Note  Patient Name: Sheila Morales Date of Encounter: 03/24/2021  [facility] Cardiologist: Elouise Munroe, MD    Subjective    70 yo with hx of chronic diastolic CHF, COPD, ecoli bacteremia,  new on set Afib , pacer for sss.  Echo shows hyperdynamic LV EF of 75%.  Has altered waxing and waning  mental status    Inpatient Medications    Scheduled Meds:  aspirin EC  81 mg Oral Daily   Chlorhexidine Gluconate Cloth  6 each Topical Daily   folic acid  1 mg Oral Daily   ipratropium  0.5 mg Nebulization Q6H   levalbuterol  0.63 mg Nebulization Q6H   mouth rinse  15 mL Mouth Rinse BID   potassium chloride  20 mEq Oral BID   sodium chloride flush  10-40 mL Intracatheter Q12H   sodium chloride  1 g Oral TID WC   Continuous Infusions:  sodium chloride Stopped (03/24/21 0929)   amiodarone 60 mg/hr (03/24/21 1000)   cefTRIAXone (ROCEPHIN)  IV Stopped (03/23/21 1810)   heparin 1,350 Units/hr (03/24/21 1000)   PRN Meds: sodium chloride, acetaminophen **OR** acetaminophen, albuterol, benzonatate, diphenhydrAMINE, labetalol, lip balm, morphine injection, nitroGLYCERIN, ondansetron **OR** ondansetron (ZOFRAN) IV, oxyCODONE, sodium chloride flush   Vital Signs    Vitals:   03/24/21 1028 03/24/21 1100 03/24/21 1156 03/24/21 1200  BP: (!) 155/105 (!) 155/107  (!) 168/118  Pulse: (!) 147 (!) 142  (!) 150  Resp: (!) 22 (!) 23  (!) 27  Temp:   99.3 F (37.4 C)   TempSrc:   Oral   SpO2: 95% 94%  95%  Weight:      Height:        Intake/Output Summary (Last 24 hours) at 03/24/2021 1251 Last data filed at 03/24/2021 1000 Gross per 24 hour  Intake 2369.15 ml  Output 1250 ml  Net 1119.15 ml   Last 3 Weights 03/24/2021 03/23/2021 03/22/2021  Weight (lbs) 125 lb 10.6 oz 129 lb 3 oz 134 lb 14.7 oz  Weight (kg) 57 kg 58.6 kg 61.2 kg      Telemetry    Rapid atrial flutter - Personally Reviewed  ECG     - Personally Reviewed  Physical Exam   GEN: No acute distress.    Neck: No JVD Cardiac: RRR, no murmurs, rubs, or gallops.  Respiratory: Clear to auscultation bilaterally. GI: Soft, nontender, non-distended  MS: No edema; No deformity. Neuro:  Nonfocal  Psych: Normal affect   Labs    High Sensitivity Troponin:  No results for input(s): TROPONINIHS in the last 720 hours.   Chemistry Recent Labs  Lab 03/23/21 0039 03/24/21 0011 03/24/21 0907  NA 122* 122* 125*  K 3.7 3.3* 4.2  CL 89* 90* 91*  CO2 20* 21* 20*  GLUCOSE 96 152* 124*  BUN 35* 28* 28*  CREATININE 2.03* 1.60* 1.75*  CALCIUM 8.3* 8.2* 8.6*  MG 1.8 2.2 2.1  PROT 6.4* 7.0 6.6  ALBUMIN 2.7* 3.4* 3.6  AST 31 27 26   ALT 23 20 18   ALKPHOS 118 90 84  BILITOT 0.5 0.3 0.6  GFRNONAA 26* 34* 31*  ANIONGAP 13 11 14     Lipids No results for input(s): CHOL, TRIG, HDL, LABVLDL, LDLCALC, CHOLHDL in the last 168 hours.  Hematology Recent Labs  Lab 03/23/21 0039 03/24/21 0011 03/24/21 0907  WBC 16.1* 15.5* 15.1*  RBC 3.65* 3.35* 3.37*  HGB 11.9* 10.8* 10.7*  HCT 32.8* 29.3* 29.6*  MCV  89.9 87.5 87.8  MCH 32.6 32.2 31.8  MCHC 36.3* 36.9* 36.1*  RDW 13.4 13.4 13.7  PLT 169 167 168   Thyroid  Recent Labs  Lab 03/22/21 0648  TSH 0.909    BNP Recent Labs  Lab 03/21/21 1206 03/21/21 1915 03/22/21 0648 03/24/21 0011  BNP  --  1,399.1* 1,498.1* 2,707.6*  PROBNP 1,279.0*  --   --   --     DDimer No results for input(s): DDIMER in the last 168 hours.   Radiology    US RENAL  Result Date: 03/23/2021 CLINICAL DATA:  Acute kidney injury. EXAM: RENAL / URINARY TRACT ULTRASOUND COMPLETE COMPARISON:  March 21, 2004 FINDINGS: Right Kidney: Renal measurements: 12.9 cm x 5.1 cm x 4.2 cm = volume: 145.59 mL. Echogenicity within normal limits. No mass or hydronephrosis visualized. Left Kidney: Renal measurements: 13.3 cm x 4.7 cm x 4.5 cm = volume: 147.69 mL. Echogenicity within normal limits. No mass or hydronephrosis visualized. Bladder: Appears normal for degree of bladder  distention. Other: None. IMPRESSION: Normal renal ultrasound. Electronically Signed   By: Virgina Norfolk M.D.   On: 03/23/2021 01:49   DG CHEST PORT 1 VIEW  Result Date: 03/24/2021 CLINICAL DATA:  Shortness of breath EXAM: PORTABLE CHEST 1 VIEW COMPARISON:  03/23/2021 FINDINGS: Persistent bilateral pulmonary opacities. Similar lung aeration. No significant pleural effusion. No pneumothorax. Stable mild cardiomegaly. IMPRESSION: Persistent bilateral pulmonary opacities reflecting pulmonary edema or multifocal pneumonia. Electronically Signed   By: Macy Mis M.D.   On: 03/24/2021 11:07   DG CHEST PORT 1 VIEW  Result Date: 03/23/2021 CLINICAL DATA:  Shortness of breath EXAM: PORTABLE CHEST 1 VIEW COMPARISON:  03/21/2021 FINDINGS: Moderate interstitial pulmonary edema, greatest in the right upper lobe. Mild cardiomegaly. No pneumothorax. IMPRESSION: Cardiomegaly and moderate interstitial pulmonary edema. Electronically Signed   By: Ulyses Jarred M.D.   On: 03/23/2021 22:15    Cardiac Studies      Patient Profile     70 y.o. female with COPD, chronic diastolic CHF, COPD, e coli bacteremia with rapid atrial flutter   Assessment & Plan      Atrial fib / flutter with RVR - cont amio.  Will add Diltiazem drip since her bp is now on the high side.   I suspect her rapid HR is due to her underlying illness / bacteremia  On heparin drip    For questions or updates, please contact [facility] Please consult [email or web address] for contact info under        Signed, Mertie Moores, MD  03/24/2021, 12:51 PM

## 2021-03-24 NOTE — Progress Notes (Signed)
eLink Physician-Brief Progress Note Patient Name: Sheila Morales DOB: 08/07/1950 MRN: [id number]   Date of Service  03/24/2021  HPI/Events of Note  has po K ordered but now on BiPap. Can we switch it to IV.  eICU Interventions  A fib/CHF. Last K was 4.2, Cr 1.7  Will DC oral  klor con. Potassium 10 meq intravenously once ordered. Follow AM lab and replace accordingly. Goal to keep > 4.      Intervention Category Minor Interventions: Electrolytes abnormality - evaluation and management  Elmer Sow 03/24/2021, 11:15 PM

## 2021-03-24 NOTE — Progress Notes (Signed)
PROGRESS NOTE    Sheila Morales  LFY:[id number] DOB: 1951-01-25 DOA: 03/21/2021 PCP: Hoyt Koch, MD   Brief Narrative:   The patient is a 70 year old Caucasian female with a past medical history significant for but #2 history of hypoxia, ischemic colitis, history of COPD, irritable bowel syndrome, restless leg syndrome, lupus, varicose veins of both legs as well as other comorbidities who presented to the ED with a chief complaint of dyspnea, fatigue, weakness and altered mental status.  Patient was to somewhat confused and history is obtained from the patient's husband who is at bedside and states that about 4 days ago she started feeling weak, tired and short of breath.  She complained of dyspnea at the most and did have coughing spells but clear nonproductive sputum.  She was given some Mucinex and Sudafed which improved her symptoms but then she went to urgent care was tested for COVID and they noted that she had crackles and congestion in her lungs needed sent to the ED.  She left the ED due to a long wait times and started to feel better the day after ED visit and then she is supposed to follow-up with several doctors in eventually went to her pulmonologist.  The pulmonologist refused her chart and started on antibiotics and Lasix and then came back later and told her that her sodium was extremely low and that she needed to go to the ED.  She went to [facility] and stabilized and started on oxygen and antibiotics as well as Lasix.  Around 2 AM she was transferred to [facility] for further hospitalization.  Husband did note that she had coughing spells that she had overnight and he attributed to postnasal drip.  She did have a slight temperature of 100.0 at [facility] for which she was given acetaminophen for.  The husband reports the patient's had decreased appetite for at least 5 years with weight loss and her mentation was still somewhat confused but her breathing is  stabilized improved.  In the ED she is given IV vancomycin and cefepime as well as acetaminophen, Lasix, Ativan, Flagyl, Zofran and vancomycin.  She was noted to have leukocytosis 24.8 and had a CT scan of the chest which was negative for airspace disease.  Chest x-ray showed diffuse chronic appearing interstitial lung markings that were stable from prior.  His and she did have a hyponatremia of 116.  Her BNP was elevated on admission and lactic acid trended down.  She was admitted for work-up of her volume overload and acute hypoxic respiratory failure in the setting of likely CHF of unknown type.  But then she became euvolemic and dry and did not likely have a CHF exacerbation.    Further work-up revealed that she had an E. coli bacteremia and then subsequently she went into A. fib with RVR.  Given that her heart rates were uncontrolled she was given IV diltiazem then transition to amiodarone given her softer blood pressures.  She was started on anticoagulant heparin drip.  Diuresis further held and nephrology was consulted given her AKI and they felt that is nonoliguric likely hemodynamically mediated in the setting of hypotension, ARB, diuretics and sepsis.  They bolused her 500 cc of LR and albumin to increase her intravascular volume.  Her hyponatremia started improving they also checked a urine sodium and urine awesome to review.  Patient was still confused but husband states that her main issue is now inability to sleep  Overnight she further decompensated and went into respiratory distress.  She is given a dose of IV Lasix and a BNP was elevated again at 2700.  Nephrology recommending no further fluids and diuretics and regular start salt tabs.  Given her worsening respiratory status and continued altered mental status critical care was consulted and it was felt that the best decision was to transfer the patient to ICU for further evaluation and management.  Assessment & Plan:   Principal Problem:    Acute CHF (congestive heart failure) (HCC) Active Problems:   Tobacco use   COPD (chronic obstructive pulmonary disease) (HCC)   Hyponatremia   AKI (acute kidney injury) ([place])   Hypertension   Pulmonary vascular congestion   Acute metabolic encephalopathy  Acute respiratory failure with hypoxia in the suspected setting of acute diastolic CHF as well as community-acquired pneumonia -Had to be placed on BiPAP short-term and then was weaned off; respiratory status had improved however worsened again and had to be placed on 8 L and titrated up -SpO2: 95 % O2 Flow Rate (L/min): (S) 8 L/min (spo2 86) FiO2 (%): 36 % -Repeat ABG done and showed:    Component Value Date/Time   PHART 7.382 03/24/2021 0900   PCO2ART 37.1 03/24/2021 0900   PO2ART 73.5 (L) 03/24/2021 0900   HCO3 21.6 03/24/2021 0900   TCO2 23 03/21/2021 2118   ACIDBASEDEF 2.7 (H) 03/24/2021 0900   O2SAT 94.4 03/24/2021 0900   -CT of the chest done and showed "Negative for acute airspace disease or pneumonia  Mild emphysema. Mild subpleural reticulation, consistent with mild degree of fibrosis. Cardiomegaly. Mild mediastinal lymph nodes which are nonspecific and may be reactive." -Continuous pulse oximetry maintain O2 saturation greater 90% -Continue supplemental oxygen via nasal cannula and wean O2 as tolerated -Continued with diuresis but this has not been stopped and start Xopenex/Atrovent every 6 scheduled -Respiratory status was slowly improving and she has been placed on IV ceftriaxone with hoping to avoid IV nephrotoxic antibiotics if possible -We will need an ambulatory home O2 screen prior to discharge as well as repeat chest x-ray in a.m. -Given her worsening respiratory status pulmonary was consulted for further evaluation and critical care recommended transferring to ICU for further evaluation and management so she will be transferred to the critical care service at this time   Acute on congestive heart failure  exacerbation likely Diastolic Failure, improved and now is dry -Major Criteria  Crackles on exam, pulm edema on chest xray -Minor criteria dyspnea on exertion but this is now improved -IV diuresis initiated with 40 mg IV Lasix, continue with 40 mg IV twice daily but now diuresis has been held given her AKI.  Weight is down to 58.6 and she appears dry now -Last Echo November 2021 which showed an ejection fraction 65-70% and grade 2 diastolic dysfunction, update echo -Given a dose of IV Lasix early this morning given that her BNP was was 2707.6 -Daily weights, fluid restriction, strict Is and Os; she is +3.116 L since admission -Continued ARB but will hold given Renal Dysfunction  -Cardiology consulted for further evaluation and recommendations and she does not appear volume overloaded anymore but has had LA pressure on echo and suspect rate related  E. coli bacteremia -Suspected secondary to UTI in immunocompromised patient -Patient spiked a temperature this morning -She presented to urgent care with lower abdominal pain, constipation and fatigue for 2 to 3 days and urine culture showed no growth and was obtained after she is  started on broad-spectrum antibiotics and they suspect the bacteremia secondary to UTI -ID is less concerned about pacemaker infection with gram-negative bacteremia and there is no indication of infection on exam and negative TTE -ID recommending repeating blood cultures and continue IV ceftriaxone following blood cultures from 03/21/2021 which have been negative so far  New onset A. fib with RVR, persistent -In the setting of infection, sepsis, [facility] pneumonia and bacteremia from E. coli -Heart rates were uncontrolled in the 160s and 170s and she was initially given 5 mg IV metoprolol and then 10 mg of Cardizem and then placed on a Cardizem drip -Ultimately Cardizem has changed to IV amiodarone due to softer blood pressure but now given that her blood pressure is  elevated Cardizem drip will be added back -Cardiology recommends continuing amiodarone drip and heparin for anticoagulation -Echo done and showed a hyperdynamic LVEF of 7075% -Cardiology is following appreciate further evaluation recommendation  Acute Metabolic Encephalopathy, persistent -Most likely secondary to electrolyte disturbance and now infection -Infection was considered as a possibility given now that she has an E. coli bacteremia and pneumonia -Currently protecting airway, arousable -VBG does not show an explanation for altered mental status -Check TSH was within normal limits -Continue to monitor   Acute on chronic hyponatremia -Over couple of days sodium has gone from 121 -> 113 -> 116 -> 117 -> 119 and trended up to 124 and then trended down to 121 is now 122 and remains 122 this morning and repeat this afternoon was 125 -Mostly thought to be in the setting of fluid overload but now she was hypovolemic so the Lasix is stopped; given that she decompensated overnight she is given another dose of IV Lasix -Nephrology has been giving her 500 mL bolus and albumin every 6 hours yesterday -Nephrology is unclear if she has some high ADH state in the setting of pneumonia and they are going to check a urine sodium and urine osmolality to review -Urine electrolytes already ordered -Continue to monitor and nephrology is consulted for further evaluation   AKI -Creatinine increased 0.7 -> 1.86; Now BUN/Cr is 31/2.06 and trended up to 32/2.11 and is trending slowly down is 35/2.03 now and further improved to 28/1.60 but after diuresis went up to 28/1.75 -Lasix has been discontinued but she received a dose overnight -Avoid further nephrotoxic medications, contrast dyes, hypotension and dehydration renally adjust medications -Nephrology has been consulted for further evaluation and they feel that this is a nonoliguric likely hemodynamically mediated in the setting of hypotension, ARB, diuretics  and sepsis -UA and renal ultrasound were unremarkable -She appeared dry so nephrology has ordered her 500 cc LR bolus and albumin to help increase her intravascular volume and recommending strict I's and O's and daily weights: Further fluid has been held and now nephrology recommending salt tabs -Currently there is no indication for dialysis  Tobacco Use Disorder -Patient is to continue with her counseling -Nicotine patch ordered for when patient does have cravings   COPD -Continue albuterol and stop Anoro and start Xopenex and Atrovent -We will start Xopenex and Atrovent -Could have underlying ILD given that she is immunocompromise and history of SLE  SLE -Holding methotrexate and Plaquenil given her infection   Community Acquired Pneumonia -With leukocytosis 24.8, hypoxic respiratory failure, findings on chest x-ray -WBC is slowly trending down and went to 19.1 yesterday is now 16.1 and is further improved to 15.5 this a.m. and is now 15.1 -Vancomycin has now been discontinued -Continue to monitor  and repeat chest x-ray in a.m.   Hypertension -Continued irbesartan but will stop given her Renal Dysfunction  -Blood pressures have been on the softer side but now elevated at 168/118 and so cardiology will be adding a diltiazem drip -Continue to monitor blood pressures per protocol   Normocytic anemia -Hemoglobin/hematocrit is 11.9/32.8 and trended down to 10.8/29.3 and is now further 10.7/29.7 -Check anemia panel in a.m. -Continue to monitor for signs and symptoms of bleeding; no overt bleeding noted -Repeat CBC in a.m.  DVT prophylaxis: Anticoagulated with a heparin drip Code Status: Full code Family Communication: Discussed with husband at bedside Disposition Plan: Pending further clinical improvement and clearance by specialist  Status is: Inpatient  Remains inpatient appropriate because: Continues to be encephalopathic and will need treatment for her bacteremia and  improvement in her heart rate as well as her AKI  Consultants:  Cardiology Nephrology ID  Procedures: None  Antimicrobials:  Anti-infectives (From admission, onward)    Start     Dose/Rate Route Frequency Ordered Stop   03/22/21 2000  ceFEPIme (MAXIPIME) 2 g in sodium chloride 0.9 % 100 mL IVPB  Status:  Discontinued        2 g 200 mL/hr over 30 Minutes Intravenous Every 24 hours 03/21/21 2209 03/22/21 1456   03/22/21 1800  cefTRIAXone (ROCEPHIN) 2 g in sodium chloride 0.9 % 100 mL IVPB        2 g 200 mL/hr over 30 Minutes Intravenous Every 24 hours 03/22/21 1456     03/22/21 1600  vancomycin (VANCOREADY) IVPB 750 mg/150 mL  Status:  Discontinued        750 mg 150 mL/hr over 60 Minutes Intravenous Every 24 hours 03/22/21 1419 03/23/21 1018   03/21/21 2209  vancomycin variable dose per unstable renal function (pharmacist dosing)  Status:  Discontinued         Does not apply See admin instructions 03/21/21 2209 03/22/21 1425   03/21/21 1930  ceFEPIme (MAXIPIME) 2 g in sodium chloride 0.9 % 100 mL IVPB        2 g 200 mL/hr over 30 Minutes Intravenous  Once 03/21/21 1916 03/21/21 2203   03/21/21 1930  metroNIDAZOLE (FLAGYL) IVPB 500 mg        500 mg 100 mL/hr over 60 Minutes Intravenous  Once 03/21/21 1916 03/21/21 2101   03/21/21 1930  vancomycin (VANCOCIN) IVPB 1000 mg/200 mL premix        1,000 mg 200 mL/hr over 60 Minutes Intravenous  Once 03/21/21 1916 03/21/21 2253        Subjective: Seen and examined at bedside and remained significantly confused and have a bit more labored and increased work of breathing.  Heart rate continue to be uncontrolled even on the amiodarone drip.  Do not look as well as she did yesterday and she seems to be decompensating a little.  After further discussion with specialist a decision was made to transfer to the ICU for further management and evaluation by critical care and husband was updated at bedside and appreciated the  care.  Objective: Vitals:   03/24/21 1028 03/24/21 1100 03/24/21 1156 03/24/21 1200  BP: (!) 155/105 (!) 155/107  (!) 168/118  Pulse: (!) 147 (!) 142  (!) 150  Resp: (!) 22 (!) 23  (!) 27  Temp:   99.3 F (37.4 C)   TempSrc:   Oral   SpO2: 95% 94%  95%  Weight:      Height:  Intake/Output Summary (Last 24 hours) at 03/24/2021 1311 Last data filed at 03/24/2021 1000 Gross per 24 hour  Intake 2369.15 ml  Output 1250 ml  Net 1119.15 ml    Filed Weights   03/22/21 0243 03/23/21 0546 03/24/21 0329  Weight: 61.2 kg 58.6 kg 57 kg   Examination: Physical Exam:  Constitutional: WN/WD chronically ill-appearing Caucasian female currently in no acute distress appears extremely confused and has a little bit of labored breathing  Eyes: Lids and conjunctivae normal, sclerae anicteric  ENMT: External Ears, Nose appear normal. Grossly normal hearing.  Neck: Appears normal, supple, no cervical masses, normal ROM, no appreciable thyromegaly; Respiratory: Diminished to auscultation bilaterally with coarse breath sounds, no wheezing, rales, rhonchi or crackles. Wearing supplemental oxygen via nasal cannula and has slightly labored breathing Cardiovascular: Irregularly irregular and tachycardic rate, no murmurs / rubs / gallops. S1 and S2 auscultated. No extremity edema.  Abdomen: Soft, non-tender, non-distended. Bowel sounds positive.  GU: Deferred. Musculoskeletal: No clubbing / cyanosis of digits/nails. No joint deformity upper and lower extremities.  Skin: No rashes, lesions, ulcers on limited skin evaluation. No induration; Warm and dry.  Neurologic: CN 2-12 grossly intact with no focal deficits. Romberg sign and cerebellar reflexes not assessed.  Psychiatric: Normal judgment and insight. Alert and oriented x 3. Normal mood and appropriate affect.   Data Reviewed: I have personally reviewed following labs and imaging studies  CBC: Recent Labs  Lab 03/21/21 1915 03/21/21 2118  03/22/21 0648 03/22/21 1431 03/23/21 0039 03/24/21 0011 03/24/21 0907  WBC 24.8*  --  19.4* 19.1* 16.1* 15.5* 15.1*  NEUTROABS 21.9*  --   --  16.4* 13.0* 12.0* 11.4*  HGB 12.1   < > 11.4* 12.8 11.9* 10.8* 10.7*  HCT 33.8*   < > 31.6* 36.6 32.8* 29.3* 29.6*  MCV 90.6  --  89.8 92.2 89.9 87.5 87.8  PLT 152  --  141* 153 169 167 168   < > = values in this interval not displayed.    Basic Metabolic Panel: Recent Labs  Lab 03/22/21 1431 03/22/21 1939 03/23/21 0039 03/24/21 0011 03/24/21 0907  NA 124* 121* 122* 122* 125*  K 4.0 4.0 3.7 3.3* 4.2  CL 91* 89* 89* 90* 91*  CO2 21* 20* 20* 21* 20*  GLUCOSE 111* 102* 96 152* 124*  BUN 32* 35* 35* 28* 28*  CREATININE 2.11* 2.07* 2.03* 1.60* 1.75*  CALCIUM 8.9 8.2* 8.3* 8.2* 8.6*  MG 2.0  --  1.8 2.2 2.1  PHOS 4.4  --  3.7 2.9 3.2    GFR: Estimated Creatinine Clearance: 23.3 mL/min (A) (by C-G formula based on SCr of 1.75 mg/dL (H)). Liver Function Tests: Recent Labs  Lab 03/21/21 1915 03/22/21 1431 03/23/21 0039 03/24/21 0011 03/24/21 0907  AST 42* 42* 31 27 26   ALT 26 26 23 20 18   ALKPHOS 115 122 118 90 84  BILITOT 0.9 1.0 0.5 0.3 0.6  PROT 7.3 6.9 6.4* 7.0 6.6  ALBUMIN 3.6 3.0* 2.7* 3.4* 3.6    No results for input(s): LIPASE, AMYLASE in the last 168 hours. Recent Labs  Lab 03/21/21 1915  AMMONIA 11    Coagulation Profile: Recent Labs  Lab 03/21/21 1915  INR 1.1    Cardiac Enzymes: No results for input(s): CKTOTAL, CKMB, CKMBINDEX, TROPONINI in the last 168 hours. BNP (last 3 results) Recent Labs    03/21/21 1206  PROBNP 1,279.0*    HbA1C: No results for input(s): HGBA1C in the last 72 hours. CBG:  Recent Labs  Lab 03/22/21 1332 03/22/21 1611 03/23/21 0014 03/24/21 1013 03/24/21 1152  GLUCAP 213* 118* 100* 113* 119*    Lipid Profile: No results for input(s): CHOL, HDL, LDLCALC, TRIG, CHOLHDL, LDLDIRECT in the last 72 hours. Thyroid Function Tests: Recent Labs    03/22/21 0648  TSH  0.909    Anemia Panel: No results for input(s): VITAMINB12, FOLATE, FERRITIN, TIBC, IRON, RETICCTPCT in the last 72 hours. Sepsis Labs: Recent Labs  Lab 03/21/21 2110 03/22/21 1431 03/22/21 1656 03/24/21 0906  LATICACIDVEN 0.9 1.3 1.0 1.0     Recent Results (from the past 240 hour(s))  Resp Panel by RT-PCR (Flu A&B, Covid) Nasopharyngeal Swab     Status: None   Collection Time: 03/19/21 12:26 PM   Specimen: Nasopharyngeal Swab; Nasopharyngeal(NP) swabs in vial transport medium  Result Value Ref Range Status   SARS Coronavirus 2 by RT PCR NEGATIVE NEGATIVE Final    Comment: (NOTE) SARS-CoV-2 target nucleic acids are NOT DETECTED.  The SARS-CoV-2 RNA is generally detectable in upper respiratory specimens during the acute phase of infection. The lowest concentration of SARS-CoV-2 viral copies this assay can detect is 138 copies/mL. A negative result does not preclude SARS-Cov-2 infection and should not be used as the sole basis for treatment or other patient management decisions. A negative result may occur with  improper specimen collection/handling, submission of specimen other than nasopharyngeal swab, presence of viral mutation(s) within the areas targeted by this assay, and inadequate number of viral copies(<138 copies/mL). A negative result must be combined with clinical observations, patient history, and epidemiological information. The expected result is Negative.  Fact Sheet for Patients:  EntrepreneurPulse.com.au  Fact Sheet for Healthcare Providers:  IncredibleEmployment.be  This test is no t yet approved or cleared by the [place] FDA and  has been authorized for detection and/or diagnosis of SARS-CoV-2 by FDA under an Emergency Use Authorization (EUA). This EUA will remain  in effect (meaning this test can be used) for the duration of the COVID-19 declaration under Section 564(b)(1) of the Act, 21 U.S.C.section  360bbb-3(b)(1), unless the authorization is terminated  or revoked sooner.       Influenza A by PCR NEGATIVE NEGATIVE Final   Influenza B by PCR NEGATIVE NEGATIVE Final    Comment: (NOTE) The Xpert Xpress SARS-CoV-2/FLU/RSV plus assay is intended as an aid in the diagnosis of influenza from Nasopharyngeal swab specimens and should not be used as a sole basis for treatment. Nasal washings and aspirates are unacceptable for Xpert Xpress SARS-CoV-2/FLU/RSV testing.  Fact Sheet for Patients: EntrepreneurPulse.com.au  Fact Sheet for Healthcare Providers: IncredibleEmployment.be  This test is not yet approved or cleared by the [place] FDA and has been authorized for detection and/or diagnosis of SARS-CoV-2 by FDA under an Emergency Use Authorization (EUA). This EUA will remain in effect (meaning this test can be used) for the duration of the COVID-19 declaration under Section 564(b)(1) of the Act, 21 U.S.C. section 360bbb-3(b)(1), unless the authorization is terminated or revoked.  Performed at [facility], [place] [address] [place], [place] [address]   Resp Panel by RT-PCR (Flu A&B, Covid) Nasopharyngeal Swab     Status: None   Collection Time: 03/21/21  7:15 PM   Specimen: Nasopharyngeal Swab; Nasopharyngeal(NP) swabs in vial transport medium  Result Value Ref Range Status   SARS Coronavirus 2 by RT PCR NEGATIVE NEGATIVE Final    Comment: (NOTE) SARS-CoV-2 target nucleic acids are NOT DETECTED.  The SARS-CoV-2 RNA is  generally detectable in upper respiratory specimens during the acute phase of infection. The lowest concentration of SARS-CoV-2 viral copies this assay can detect is 138 copies/mL. A negative result does not preclude SARS-Cov-2 infection and should not be used as the sole basis for treatment or other patient management decisions. A negative result may occur with  improper specimen collection/handling, submission of  specimen other than nasopharyngeal swab, presence of viral mutation(s) within the areas targeted by this assay, and inadequate number of viral copies(<138 copies/mL). A negative result must be combined with clinical observations, patient history, and epidemiological information. The expected result is Negative.  Fact Sheet for Patients:  EntrepreneurPulse.com.au  Fact Sheet for Healthcare Providers:  IncredibleEmployment.be  This test is no t yet approved or cleared by the [place] FDA and  has been authorized for detection and/or diagnosis of SARS-CoV-2 by FDA under an Emergency Use Authorization (EUA). This EUA will remain  in effect (meaning this test can be used) for the duration of the COVID-19 declaration under Section 564(b)(1) of the Act, 21 U.S.C.section 360bbb-3(b)(1), unless the authorization is terminated  or revoked sooner.       Influenza A by PCR NEGATIVE NEGATIVE Final   Influenza B by PCR NEGATIVE NEGATIVE Final    Comment: (NOTE) The Xpert Xpress SARS-CoV-2/FLU/RSV plus assay is intended as an aid in the diagnosis of influenza from Nasopharyngeal swab specimens and should not be used as a sole basis for treatment. Nasal washings and aspirates are unacceptable for Xpert Xpress SARS-CoV-2/FLU/RSV testing.  Fact Sheet for Patients: EntrepreneurPulse.com.au  Fact Sheet for Healthcare Providers: IncredibleEmployment.be  This test is not yet approved or cleared by the [place] FDA and has been authorized for detection and/or diagnosis of SARS-CoV-2 by FDA under an Emergency Use Authorization (EUA). This EUA will remain in effect (meaning this test can be used) for the duration of the COVID-19 declaration under Section 564(b)(1) of the Act, 21 U.S.C. section 360bbb-3(b)(1), unless the authorization is terminated or revoked.  Performed at [facility], [address] [place], [place] [address]   Blood Culture (routine x 2)     Status: Abnormal   Collection Time: 03/21/21  7:15 PM   Specimen: Left Antecubital; Blood  Result Value Ref Range Status   Specimen Description   Final    LEFT ANTECUBITAL Performed at [facility], [address] [place], [place] [address]    Special Requests   Final    BOTTLES DRAWN AEROBIC AND ANAEROBIC Blood Culture adequate volume Performed at [facility], [address] [place], [place] [address]    Culture  Setup Time   Final    GRAM NEGATIVE RODS IN BOTH AEROBIC AND ANAEROBIC BOTTLES CRITICAL RESULT CALLED TO, READ BACK BY AND VERIFIED WITH: Andres Shad PHARMD 1407 03/22/21 A BROWNING    Culture (A)  Final    ESCHERICHIA COLI SUSCEPTIBILITIES PERFORMED ON PREVIOUS CULTURE WITHIN THE LAST 5 DAYS. Performed at [facility], [place] [address] [place], [place] [address]    Report Status 03/24/2021 FINAL  Final  Blood Culture (routine x 2)     Status: Abnormal   Collection Time: 03/21/21  7:15 PM   Specimen: Right Antecubital; Blood  Result Value Ref Range Status   Specimen Description   Final    RIGHT ANTECUBITAL Performed at [facility], [address] [place], [place] [address]    Special Requests   Final  BOTTLES DRAWN AEROBIC AND ANAEROBIC Blood Culture adequate volume Performed at [facility], [address] [place], [place] [address]    Culture  Setup Time   Final    GRAM NEGATIVE RODS IN BOTH AEROBIC AND ANAEROBIC BOTTLES CRITICAL RESULT CALLED TO, READ BACK BY AND VERIFIED WITHAndres Shad [facility] 4332 03/22/21 A BROWNING Performed at [facility], [place] [address] [place], [place] [address]    Culture ESCHERICHIA COLI (A)  Final   Report Status 03/24/2021 FINAL  Final   Organism ID, Bacteria ESCHERICHIA COLI  Final      Susceptibility   Escherichia coli - MIC*    AMPICILLIN 4 SENSITIVE Sensitive     CEFAZOLIN <=4 SENSITIVE Sensitive      CEFEPIME <=0.12 SENSITIVE Sensitive     CEFTAZIDIME <=1 SENSITIVE Sensitive     CEFTRIAXONE <=0.25 SENSITIVE Sensitive     CIPROFLOXACIN <=0.25 SENSITIVE Sensitive     GENTAMICIN <=1 SENSITIVE Sensitive     IMIPENEM <=0.25 SENSITIVE Sensitive     TRIMETH/SULFA <=20 SENSITIVE Sensitive     AMPICILLIN/SULBACTAM <=2 SENSITIVE Sensitive     PIP/TAZO <=4 SENSITIVE Sensitive     * ESCHERICHIA COLI  Blood Culture ID Panel (Reflexed)     Status: Abnormal   Collection Time: 03/21/21  7:15 PM  Result Value Ref Range Status   Enterococcus faecalis NOT DETECTED NOT DETECTED Final   Enterococcus Faecium NOT DETECTED NOT DETECTED Final   Listeria monocytogenes NOT DETECTED NOT DETECTED Final   Staphylococcus species NOT DETECTED NOT DETECTED Final   Staphylococcus aureus (BCID) NOT DETECTED NOT DETECTED Final   Staphylococcus epidermidis NOT DETECTED NOT DETECTED Final   Staphylococcus lugdunensis NOT DETECTED NOT DETECTED Final   Streptococcus species NOT DETECTED NOT DETECTED Final   Streptococcus agalactiae NOT DETECTED NOT DETECTED Final   Streptococcus pneumoniae NOT DETECTED NOT DETECTED Final   Streptococcus pyogenes NOT DETECTED NOT DETECTED Final   A.calcoaceticus-baumannii NOT DETECTED NOT DETECTED Final   Bacteroides fragilis NOT DETECTED NOT DETECTED Final   Enterobacterales DETECTED (A) NOT DETECTED Final    Comment: Enterobacterales represent a large order of gram negative bacteria, not a single organism. CRITICAL RESULT CALLED TO, READ BACK BY AND VERIFIED WITH: Andres Shad PHARMD 1407 03/22/21 A BROWNING    Enterobacter cloacae complex NOT DETECTED NOT DETECTED Final   Escherichia coli DETECTED (A) NOT DETECTED Final    Comment: CRITICAL RESULT CALLED TO, READ BACK BY AND VERIFIED WITH: Andres Shad PHARMD 1407 03/22/21 A BROWNING    Klebsiella aerogenes NOT DETECTED NOT DETECTED Final   Klebsiella oxytoca NOT DETECTED NOT DETECTED Final   Klebsiella pneumoniae NOT DETECTED NOT DETECTED  Final   Proteus species NOT DETECTED NOT DETECTED Final   Salmonella species NOT DETECTED NOT DETECTED Final   Serratia marcescens NOT DETECTED NOT DETECTED Final   Haemophilus influenzae NOT DETECTED NOT DETECTED Final   Neisseria meningitidis NOT DETECTED NOT DETECTED Final   Pseudomonas aeruginosa NOT DETECTED NOT DETECTED Final   Stenotrophomonas maltophilia NOT DETECTED NOT DETECTED Final   Candida albicans NOT DETECTED NOT DETECTED Final   Candida auris NOT DETECTED NOT DETECTED Final   Candida glabrata NOT DETECTED NOT DETECTED Final   Candida krusei NOT DETECTED NOT DETECTED Final   Candida parapsilosis NOT DETECTED NOT DETECTED Final   Candida tropicalis NOT DETECTED NOT DETECTED Final   Cryptococcus neoformans/gattii NOT DETECTED NOT DETECTED Final   CTX-M ESBL NOT DETECTED NOT DETECTED Final  Carbapenem resistance IMP NOT DETECTED NOT DETECTED Final   Carbapenem resistance KPC NOT DETECTED NOT DETECTED Final   Carbapenem resistance NDM NOT DETECTED NOT DETECTED Final   Carbapenem resist OXA 48 LIKE NOT DETECTED NOT DETECTED Final   Carbapenem resistance VIM NOT DETECTED NOT DETECTED Final    Comment: Performed at [facility], [place] [address] [place], [place] [address]  Urine Culture     Status: None   Collection Time: 03/22/21  2:07 PM   Specimen: In/Out Cath Urine  Result Value Ref Range Status   Specimen Description IN/OUT CATH URINE  Final   Special Requests NONE  Final   Culture   Final    NO GROWTH Performed at [facility], [place] [address] [place], [place] [address]    Report Status 03/23/2021 FINAL  Final  MRSA Next Gen by PCR, Nasal     Status: None   Collection Time: 03/22/21  3:00 PM  Result Value Ref Range Status   MRSA by PCR Next Gen NOT DETECTED NOT DETECTED Final    Comment: (NOTE) The GeneXpert MRSA Assay (FDA approved for NASAL specimens only), is one component of a comprehensive MRSA colonization surveillance program. It is not  intended to diagnose MRSA infection nor to guide or monitor treatment for MRSA infections. Test performance is not FDA approved in patients less than 63 years old. Performed at [facility], [place] [address], [place], [place] [address]   Culture, blood (routine x 2)     Status: None (Preliminary result)   Collection Time: 03/23/21  5:44 PM   Specimen: BLOOD  Result Value Ref Range Status   Specimen Description BLOOD LEFT ANTECUBITAL  Final   Special Requests   Final    BOTTLES DRAWN AEROBIC AND ANAEROBIC Blood Culture results may not be optimal due to an inadequate volume of blood received in culture bottles   Culture   Final    NO GROWTH < 24 HOURS Performed at [facility], [place] [address] [place], [place] [address]    Report Status PENDING  Incomplete  Culture, blood (routine x 2)     Status: None (Preliminary result)   Collection Time: 03/23/21  5:44 PM   Specimen: BLOOD LEFT HAND  Result Value Ref Range Status   Specimen Description BLOOD LEFT HAND  Final   Special Requests   Final    BOTTLES DRAWN AEROBIC ONLY Blood Culture adequate volume   Culture   Final    NO GROWTH < 24 HOURS Performed at [facility], [place] [address], [place], [place] [address]    Report Status PENDING  Incomplete     RN Pressure Injury Documentation:     Estimated body mass index is 22.98 kg/m as calculated from the following:   Height as of this encounter: 5\' 2"  (1.575 m).   Weight as of this encounter: 57 kg.  Malnutrition Type:   Malnutrition Characteristics:   Nutrition Interventions:    Radiology Studies: US RENAL  Result Date: 03/23/2021 CLINICAL DATA:  Acute kidney injury. EXAM: RENAL / URINARY TRACT ULTRASOUND COMPLETE COMPARISON:  March 21, 2004 FINDINGS: Right Kidney: Renal measurements: 12.9 cm x 5.1 cm x 4.2 cm = volume: 145.59 mL. Echogenicity within normal limits. No mass or hydronephrosis visualized. Left Kidney: Renal measurements: 13.3 cm x 4.7 cm x 4.5  cm = volume: 147.69 mL. Echogenicity within normal limits. No mass or hydronephrosis visualized. Bladder: Appears normal for degree  of bladder distention. Other: None. IMPRESSION: Normal renal ultrasound. Electronically Signed   By: Virgina Norfolk M.D.   On: 03/23/2021 01:49   DG CHEST PORT 1 VIEW  Result Date: 03/24/2021 CLINICAL DATA:  Shortness of breath EXAM: PORTABLE CHEST 1 VIEW COMPARISON:  03/23/2021 FINDINGS: Persistent bilateral pulmonary opacities. Similar lung aeration. No significant pleural effusion. No pneumothorax. Stable mild cardiomegaly. IMPRESSION: Persistent bilateral pulmonary opacities reflecting pulmonary edema or multifocal pneumonia. Electronically Signed   By: Macy Mis M.D.   On: 03/24/2021 11:07   DG CHEST PORT 1 VIEW  Result Date: 03/23/2021 CLINICAL DATA:  Shortness of breath EXAM: PORTABLE CHEST 1 VIEW COMPARISON:  03/21/2021 FINDINGS: Moderate interstitial pulmonary edema, greatest in the right upper lobe. Mild cardiomegaly. No pneumothorax. IMPRESSION: Cardiomegaly and moderate interstitial pulmonary edema. Electronically Signed   By: Ulyses Jarred M.D.   On: 03/23/2021 22:15    Scheduled Meds:  aspirin EC  81 mg Oral Daily   Chlorhexidine Gluconate Cloth  6 each Topical Daily   diltiazem  10 mg Intravenous Once   folic acid  1 mg Oral Daily   ipratropium  0.5 mg Nebulization Q6H   levalbuterol  0.63 mg Nebulization Q6H   mouth rinse  15 mL Mouth Rinse BID   potassium chloride  20 mEq Oral BID   sodium chloride flush  10-40 mL Intracatheter Q12H   sodium chloride  1 g Oral TID WC   Continuous Infusions:  sodium chloride Stopped (03/24/21 0929)   amiodarone 60 mg/hr (03/24/21 1000)   cefTRIAXone (ROCEPHIN)  IV Stopped (03/23/21 1810)   diltiazem (CARDIZEM) infusion     heparin 1,350 Units/hr (03/24/21 1000)    LOS: 2 days   Kerney Elbe, DO [facility] PAGER is on AMION  If 7PM-7AM, please contact  night-coverage [email or web address]

## 2021-03-24 NOTE — Progress Notes (Signed)
[facility] PROGRESS NOTE  Assessment/ Plan: Pt is a 70 y.o. yo female   with history of COPD, IBS, HLD, ischemic colitis, admitted on 12/1 for pneumonia, E. coli bacteremia and sepsis.  We are consulted for acute on chronic hyponatremia and AKI.  Initially, the patient was thought to have acute on chronic heart failure therefore treated with IV Lasix.  The the creatinine level and sodium level worsened therefore it was discontinued.   # Acute kidney injury, nonoliguric likely hemodynamically mediated in the setting of hypotension: ARB, diuretics and sepsis. UA and kidney ultrasound unremarkable.  ARB and Lasix held.  Received 500 cc of LR bolus with albumin yesterday.  Urine output 1.5 L and serum creatinine level trending down.  I will hold off further IV fluid and continue to hold diuretics.  Recommend to continue with strict ins and out and daily lab.   #Acute on chronic hyponatremia: Urinary studies pointing towards SIADH. TSH within normal range.  Received a dose of loop diuretics/Lasix overnight.  Continue fluid restriction and I will add salt tablet.  #Acute metabolic encephalopathy probably in the setting of pneumonia and infection.  No focal deficit.  Continue to monitor.  #Hypokalemia: Replete potassium chloride.  Monitor lab.  #E. coli bacteremia suspected due to UTI: Seen by ID repeat culture on 11/30, continue ceftriaxone.  Subjective: Seen and examined at bedside.  Urine output around 1.5 L.  Denies chest pain, shortness of breath, nausea or vomiting.  Her husband at bedside. Objective Vital signs in last 24 hours: Vitals:   03/24/21 0630 03/24/21 0700 03/24/21 0721 03/24/21 0743  BP: (!) 146/97 (!) 148/94  (!) 149/110  Pulse: (!) 154 (!) 147  (!) 154  Resp: (!) 26 (!) 30  (!) 28  Temp:   100.3 F (37.9 C)   TempSrc:   Oral   SpO2: 95% 94%  94%  Weight:      Height:       Weight change: -1.6 kg  Intake/Output Summary (Last 24 hours) at  03/24/2021 0804 Last data filed at 03/24/2021 0076 Gross per 24 hour  Intake 2398.65 ml  Output 1350 ml  Net 1048.65 ml       Labs: Basic Metabolic Panel: Recent Labs  Lab 03/22/21 1431 03/22/21 1939 03/23/21 0039 03/24/21 0011  NA 124* 121* 122* 122*  K 4.0 4.0 3.7 3.3*  CL 91* 89* 89* 90*  CO2 21* 20* 20* 21*  GLUCOSE 111* 102* 96 152*  BUN 32* 35* 35* 28*  CREATININE 2.11* 2.07* 2.03* 1.60*  CALCIUM 8.9 8.2* 8.3* 8.2*  PHOS 4.4  --  3.7 2.9   Liver Function Tests: Recent Labs  Lab 03/22/21 1431 03/23/21 0039 03/24/21 0011  AST 42* 31 27  ALT 26 23 20   ALKPHOS 122 118 90  BILITOT 1.0 0.5 0.3  PROT 6.9 6.4* 7.0  ALBUMIN 3.0* 2.7* 3.4*   No results for input(s): LIPASE, AMYLASE in the last 168 hours. Recent Labs  Lab 03/21/21 1915  AMMONIA 11   CBC: Recent Labs  Lab 03/21/21 1915 03/21/21 2118 03/22/21 0648 03/22/21 1431 03/23/21 0039 03/24/21 0011  WBC 24.8*  --  19.4* 19.1* 16.1* 15.5*  NEUTROABS 21.9*  --   --  16.4* 13.0* 12.0*  HGB 12.1   < > 11.4* 12.8 11.9* 10.8*  HCT 33.8*   < > 31.6* 36.6 32.8* 29.3*  MCV 90.6  --  89.8 92.2 89.9 87.5  PLT 152  --  141*  153 169 167   < > = values in this interval not displayed.   Cardiac Enzymes: No results for input(s): CKTOTAL, CKMB, CKMBINDEX, TROPONINI in the last 168 hours. CBG: Recent Labs  Lab 03/21/21 1912 03/22/21 1316 03/22/21 1332 03/22/21 1611 03/23/21 0014  GLUCAP 82 50* 213* 118* 100*    Iron Studies: No results for input(s): IRON, TIBC, TRANSFERRIN, FERRITIN in the last 72 hours. Studies/Results: US RENAL  Result Date: 03/23/2021 CLINICAL DATA:  Acute kidney injury. EXAM: RENAL / URINARY TRACT ULTRASOUND COMPLETE COMPARISON:  March 21, 2004 FINDINGS: Right Kidney: Renal measurements: 12.9 cm x 5.1 cm x 4.2 cm = volume: 145.59 mL. Echogenicity within normal limits. No mass or hydronephrosis visualized. Left Kidney: Renal measurements: 13.3 cm x 4.7 cm x 4.5 cm = volume: 147.69  mL. Echogenicity within normal limits. No mass or hydronephrosis visualized. Bladder: Appears normal for degree of bladder distention. Other: None. IMPRESSION: Normal renal ultrasound. Electronically Signed   By: Virgina Norfolk M.D.   On: 03/23/2021 01:49   DG CHEST PORT 1 VIEW  Result Date: 03/23/2021 CLINICAL DATA:  Shortness of breath EXAM: PORTABLE CHEST 1 VIEW COMPARISON:  03/21/2021 FINDINGS: Moderate interstitial pulmonary edema, greatest in the right upper lobe. Mild cardiomegaly. No pneumothorax. IMPRESSION: Cardiomegaly and moderate interstitial pulmonary edema. Electronically Signed   By: Ulyses Jarred M.D.   On: 03/23/2021 22:15   ECHOCARDIOGRAM COMPLETE  Result Date: 03/22/2021    ECHOCARDIOGRAM REPORT   Patient Name:   Sheila Morales Date of Exam: 03/22/2021 Medical Rec #:  [id number]    Height:       62.0 in Accession #:    [id number]   Weight:       134.9 lb Date of Birth:  1950-04-30    BSA:          1.617 m Patient Age:    69 years     BP:           117/60 mmHg Patient Gender: F            HR:           73 bpm. Exam Location:  Inpatient Procedure: 2D Echo, 3D Echo, Cardiac Doppler and Color Doppler Indications:    I50.40* Unspecified combined systolic (congestive) and diastolic                 (congestive) heart failure  History:        Patient has prior history of Echocardiogram examinations, most                 recent 03/09/2020. CHF, Abnormal ECG and Pacemaker, COPD,                 Arrythmias:Bradycardia; Risk Factors:Current Smoker and                 Hypertension.  Sonographer:    Roseanna Rainbow RDCS Referring Phys: [id number] ASIA B [place]  Sonographer Comments: Technically difficult study due to poor echo windows. IMPRESSIONS  1. Left ventricular ejection fraction, by estimation, is 70 to 75%. The left ventricle has hyperdynamic function. The left ventricle has no regional wall motion abnormalities. There is mild left ventricular hypertrophy. Left ventricular diastolic parameters are  consistent with Grade II diastolic dysfunction (pseudonormalization). Elevated left atrial pressure.  2. Right ventricular systolic function is normal. The right ventricular size is normal. There is mildly elevated pulmonary artery systolic pressure.  3. Left atrial size was mildly dilated.  4. The  mitral valve is normal in structure. Mild mitral valve regurgitation. No evidence of mitral stenosis.  5. The aortic valve is tricuspid. Aortic valve regurgitation is not visualized. Aortic valve sclerosis/calcification is present, without any evidence of aortic stenosis.  6. The inferior vena cava is normal in size with greater than 50% respiratory variability, suggesting right atrial pressure of 3 mmHg. Comparison(s): No significant change from prior study. FINDINGS  Left Ventricle: Left ventricular ejection fraction, by estimation, is 70 to 75%. The left ventricle has hyperdynamic function. The left ventricle has no regional wall motion abnormalities. The left ventricular internal cavity size was normal in size. There is mild left ventricular hypertrophy. Left ventricular diastolic parameters are consistent with Grade II diastolic dysfunction (pseudonormalization). Elevated left atrial pressure. Right Ventricle: The right ventricular size is normal. Right ventricular systolic function is normal. There is mildly elevated pulmonary artery systolic pressure. The tricuspid regurgitant velocity is 3.18 m/s, and with an assumed right atrial pressure of 3 mmHg, the estimated right ventricular systolic pressure is 14.4 mmHg. Left Atrium: Left atrial size was mildly dilated. Right Atrium: Right atrial size was normal in size. Pericardium: There is no evidence of pericardial effusion. Mitral Valve: The mitral valve is normal in structure. Mild mitral annular calcification. Mild mitral valve regurgitation. No evidence of mitral valve stenosis. MV peak gradient, 12.0 mmHg. The mean mitral valve gradient is 3.0 mmHg. Tricuspid  Valve: The tricuspid valve is normal in structure. Tricuspid valve regurgitation is mild . No evidence of tricuspid stenosis. Aortic Valve: The aortic valve is tricuspid. Aortic valve regurgitation is not visualized. Aortic valve sclerosis/calcification is present, without any evidence of aortic stenosis. Pulmonic Valve: The pulmonic valve was normal in structure. Pulmonic valve regurgitation is trivial. No evidence of pulmonic stenosis. Aorta: The aortic root is normal in size and structure. Venous: The inferior vena cava is normal in size with greater than 50% respiratory variability, suggesting right atrial pressure of 3 mmHg. IAS/Shunts: No atrial level shunt detected by color flow Doppler. Additional Comments: A device lead is visualized.  LEFT VENTRICLE PLAX 2D LVIDd:         3.70 cm     Diastology LVIDs:         2.50 cm     LV e' medial:    4.95 cm/s LV PW:         1.70 cm     LV E/e' medial:  29.7 LV IVS:        1.20 cm     LV e' lateral:   7.95 cm/s LVOT diam:     2.00 cm     LV E/e' lateral: 18.5 LV SV:         83 LV SV Index:   51 LVOT Area:     3.14 cm                             3D Volume EF: LV Volumes (MOD)           3D EF:        79 % LV vol d, MOD A2C: 59.3 ml LV EDV:       97 ml LV vol d, MOD A4C: 30.4 ml LV ESV:       20 ml LV vol s, MOD A2C: 22.4 ml LV SV:        77 ml LV vol s, MOD A4C: 9.2 ml LV SV MOD A2C:  36.9 ml LV SV MOD A4C:     30.4 ml LV SV MOD BP:      30.5 ml RIGHT VENTRICLE             IVC RV S prime:     14.80 cm/s  IVC diam: 1.80 cm TAPSE (M-mode): 1.8 cm LEFT ATRIUM             Index        RIGHT ATRIUM           Index LA diam:        4.60 cm 2.84 cm/m   RA Area:     12.20 cm LA Vol (A2C):   60.9 ml 37.66 ml/m  RA Volume:   27.30 ml  16.88 ml/m LA Vol (A4C):   41.0 ml 25.35 ml/m LA Biplane Vol: 52.9 ml 32.71 ml/m  AORTIC VALVE             PULMONIC VALVE LVOT Vmax:   137.00 cm/s PR End Diast Vel: 1.81 msec LVOT Vmean:  86.200 cm/s LVOT VTI:    0.263 m  AORTA Ao Root diam:  3.30 cm Ao Asc diam:  3.10 cm MITRAL VALVE                TRICUSPID VALVE MV Area (PHT): 3.27 cm     TR Peak grad:   40.4 mmHg MV Area VTI:   1.79 cm     TR Vmax:        318.00 cm/s MV Peak grad:  12.0 mmHg MV Mean grad:  3.0 mmHg     SHUNTS MV Vmax:       1.73 m/s     Systemic VTI:  0.26 m MV Vmean:      78.4 cm/s    Systemic Diam: 2.00 cm MV Decel Time: 232 msec MV E velocity: 147.00 cm/s MV A velocity: 76.80 cm/s MV E/A ratio:  1.91 Kirk Ruths MD Electronically signed by Kirk Ruths MD Signature Date/Time: 03/22/2021/12:34:46 PM    Final     Medications: Infusions:  sodium chloride 10 mL/hr at 03/24/21 0233   amiodarone 60 mg/hr (03/24/21 0208)   cefTRIAXone (ROCEPHIN)  IV 2 g (03/23/21 1808)   heparin 1,350 Units/hr (03/24/21 0503)    Scheduled Medications:  aspirin EC  81 mg Oral Daily   folic acid  1 mg Oral Daily   ipratropium  0.5 mg Nebulization Q6H   levalbuterol  0.63 mg Nebulization Q6H   mouth rinse  15 mL Mouth Rinse BID    have reviewed scheduled and prn medications.  Physical Exam: General:NAD, comfortable Heart:RRR, s1s2 nl Lungs: Some basal crackles, no increased work of breathing Abdomen:soft, Non-tender, non-distended Extremities:No peripheral edema Neurology: Alert awake and following commands  Cleon Signorelli Tanna Furry 03/24/2021,8:04 AM  LOS: 2 days

## 2021-03-24 NOTE — Progress Notes (Signed)
Pt with HR consistently 130s-160s and then 170s with any activity. Night MD notified. Meds given per order without much improvement in HR. BP stable overnight.  Pt confused, oriented to self, occasionally follows directions. RR 20s-30. CXR done and IV lasix given per order. RR improving but HR remains elevated despite meds given. See orders and flowsheets for meds and assessment details.  Pt more restless in am and says legs hurt, PRN morphine given and pt sleeping off and on but wakes easily to any noise.  RT and RRT RN at bedside to see pt overnight and MD updated on pt status during shift. Husband at bedside in am and updated.   03/23/21 2000  Vitals  Temp 99.4 F (37.4 C)  Temp Source Axillary  BP (!) 152/103  MAP (mmHg) 115  BP Location Left Arm  BP Method Automatic  Patient Position (if appropriate) Lying  Pulse Rate (!) 171  Pulse Rate Source Monitor  ECG Heart Rate (!) 161  Resp (!) 27  Level of Consciousness  Level of Consciousness Alert  MEWS COLOR  MEWS Score Color Red  Oxygen Therapy  SpO2 94 %  O2 Device HFNC  O2 Flow Rate (L/min) 4 L/min  Patient Activity (if Appropriate) In bed  Pulse Oximetry Type Continuous  Pain Assessment  Pain Scale PAINAD  Pain Score 0  PAINAD (Pain Assessment in Advanced Dementia)  Breathing 0  Negative Vocalization 0  Facial Expression 0  Body Language 0  Consolability 0  PAINAD Score 0  Glasgow Coma Scale  Eye Opening 4  Best Verbal Response (NON-intubated) 4  Best Motor Response 6  Glasgow Coma Scale Score 14  MEWS Score  MEWS Temp 0  MEWS Systolic 0  MEWS Pulse 3  MEWS RR 2  MEWS LOC 0  MEWS Score 5  Provider Notification  Provider Name/Title Dr. Tonie Griffith  Date Provider Notified 03/23/21  Notification Type  (secure chat)  Rapid Response Notification  Name of Rapid Response RN Notified Shanon Brow RN  Date Rapid Response Notified 03/24/21  Time Rapid Response Notified 0018

## 2021-03-24 NOTE — Progress Notes (Signed)
Placed patient on bipap due to increased work of breathing.

## 2021-03-24 NOTE — Consult Note (Signed)
NAME:  Sheila Morales, MRN:  [id number], DOB:  Feb 15, 1951, LOS: 2 ADMISSION DATE:  03/21/2021, CONSULTATION DATE:  03/24/2021 REFERRING MD:  Dr. Alfredia Ferguson, CHIEF COMPLAINT: A. fib RVR with AMS  History of Present Illness:  Sheila Morales is a 70 y.o. with a past medical history significant for COPD, CHF, systemic lupus erythematosus, and ischemic colitis who presented to the emergency department 11/30 for complaints of dyspnea, fatigue, weakness, and altered mental status.  Per husband symptoms began 4 days prior to admission with a worrisome symptom of cough and malaise.  States he attempted to seek medical treatment for patient at local ER/urgent care but t when the wait time exceeded 11 hours they decided to leave and not be seen.  Morning of 12/3 patent has again developed worsening mentation with A-fib RVR with recurrent low-grade fever, tachycardia, and hypertension.  PCCM consulted for further management and transfer to ICU  Pertinent  Medical History  COPD CHF Systemic lupus erythematosus Ischemic colitis  Significant Hospital Events: Including procedures, antibiotic start and stop dates in addition to other pertinent events   11/30 admitted for generalized fatigue, weakness, and altered mental status found to be bacteremic with E. coli.  Patient was also with several metabolic derangements including severely hyponatremic with a sodium of 117  Interim History / Subjective:  Seen lying in bed lethargic and encephalopathic husband at bedside and updated  Objective   Blood pressure (!) 149/110, pulse (!) 154, temperature 100.3 F (37.9 C), temperature source Oral, resp. rate (!) 28, height 5\' 2"  (1.575 m), weight 57 kg, SpO2 94 %.    FiO2 (%):  [36 %] 36 %   Intake/Output Summary (Last 24 hours) at 03/24/2021 0844 Last data filed at 03/24/2021 6270 Gross per 24 hour  Intake 2158.65 ml  Output 1350 ml  Net 808.65 ml   Filed Weights   03/22/21 0243 03/23/21 0546 03/24/21 0329  Weight:  61.2 kg 58.6 kg 57 kg    Examination: General: Acute on chronic ill-appearing elderly female lying in bed in no acute distress HEENT: [place]/AT, MM pink/moist, PERRL,  Neuro: Well awake to verbal stimuli CV: s1s2 regular rate and rhythm, no murmur, rubs, or gallops,  PULM:  Diminished bilaterally with mild increase work of breathing, oxygen saturations a improved on nasal cannula GI: soft, bowel sounds hypoactive in all 4 quadrants, tender to palpation of RUQ, non-distended Extremities: warm/dry, no edema  Skin: no rashes or lesions  Resolved Hospital Problem list     Assessment & Plan:  Acute Hypoxic and hypercapnic Respiratory Failure  -Felt secondary to possible CAP versus pulmonary edema -Chest CT on admission negative for signs of acute pneumonia revealed mild emphysema with questionable mild degree of fibrosis and cardiomegaly Hx of COPD with daily tobacco use  P: Pulmonary hygiene Elevate head of bed at Aspiration precautions Continue empiric antibiotics  Diurese as able BiPAP therapy likely contraindicated given poor mentation Husband states patient would want to be intubated if needed but patient would not want prolonged aggressive measures  E. Coli bacteremia  -Felt secondary to UTI in immunocompromised patient P: ID following, appreciate assistance Continue to follow blood cultures Continue IV ceftriaxone  Hx of congestive heart failure  -ECHO 12/1 with EF 10-75 and grade II diastolic dysfunction  Hx of HTN New onset A. fib RVR -Felt secondary to sepsis History of sick sinus syndrome s/p permanent pacemaker P: Cardiology following, appreciate assistance Continuous telemetry  Strict intake and output  Daily weight to  assess volume status Daily assessment for need to diurese with the use of IV lasix  Closely monitor renal function and electrolytes  Provide supplemtal oxygen as needed to maintain oxygen saturations above 90% Ensure hemodynamic control patient is  currently hypertensive may require initiation of IV antihypertensive Of amiodarone drip patient had episode of hypotension with Cardizem drip Continue heparin drip  Acute metabolic encephalopathy P: Maintain neuro protective measures; goal for eurothermia, euglycemia, eunatermia, normoxia, and PCO2 goal of 35-40 Nutrition and bowel regiment  Seizure precautions  Aspirations precautions  Precautions Minimize sedating medications  Acute Kidney Injury  -Creatinine 0.70 29 April 2020, creatinine on admission 1.86 with peak of 2.11 during admission  Acute on chronic hyponatremia  P: Nephrology following, appreciate assistance  Follow renal function  Monitor urine output Trend Bmet Avoid nephrotoxins Ensure adequate renal perfusion   Hx of SLE -Home medications include Plaquenil and Methotrexate  P: Medication remains on hold Supportive care  Normocytic anemia  P: Trend CBC  Transfuse per protocol  Hgb goal greater than 7   Best Practice (right click and "Reselect all SmartList Selections" daily)   Diet/type: NPO DVT prophylaxis: systemic heparin GI prophylaxis: PPI Lines: N/A Foley:  N/A Code Status:  full code Last date of multidisciplinary goals of care discussion: Husband updated at bedside   Labs   CBC: Recent Labs  Lab 03/19/21 1226 03/21/21 1915 03/21/21 2118 03/22/21 0648 03/22/21 1431 03/23/21 0039 03/24/21 0011  WBC 20.9* 24.8*  --  19.4* 19.1* 16.1* 15.5*  NEUTROABS 18.4* 21.9*  --   --  16.4* 13.0* 12.0*  HGB 12.0 12.1 10.9* 11.4* 12.8 11.9* 10.8*  HCT 34.6* 33.8* 32.0* 31.6* 36.6 32.8* 29.3*  MCV 94.8 90.6  --  89.8 92.2 89.9 87.5  PLT 159 152  --  141* 153 169 505    Basic Metabolic Panel: Recent Labs  Lab 03/22/21 0648 03/22/21 1431 03/22/21 1939 03/23/21 0039 03/24/21 0011  NA 119* 124* 121* 122* 122*  K 4.2 4.0 4.0 3.7 3.3*  CL 89* 91* 89* 89* 90*  CO2 19* 21* 20* 20* 21*  GLUCOSE 68* 111* 102* 96 152*  BUN 31* 32* 35* 35* 28*   CREATININE 2.06* 2.11* 2.07* 2.03* 1.60*  CALCIUM 8.5* 8.9 8.2* 8.3* 8.2*  MG  --  2.0  --  1.8 2.2  PHOS  --  4.4  --  3.7 2.9   GFR: Estimated Creatinine Clearance: 25.5 mL/min (A) (by C-G formula based on SCr of 1.6 mg/dL (H)). Recent Labs  Lab 03/21/21 1915 03/21/21 2110 03/22/21 0648 03/22/21 1431 03/22/21 1656 03/23/21 0039 03/24/21 0011  WBC 24.8*  --  19.4* 19.1*  --  16.1* 15.5*  LATICACIDVEN 1.3 0.9  --  1.3 1.0  --   --     Liver Function Tests: Recent Labs  Lab 03/21/21 1915 03/22/21 1431 03/23/21 0039 03/24/21 0011  AST 42* 42* 31 27  ALT 26 26 23 20   ALKPHOS 115 122 118 90  BILITOT 0.9 1.0 0.5 0.3  PROT 7.3 6.9 6.4* 7.0  ALBUMIN 3.6 3.0* 2.7* 3.4*   No results for input(s): LIPASE, AMYLASE in the last 168 hours. Recent Labs  Lab 03/21/21 1915  AMMONIA 11    ABG    Component Value Date/Time   PHART 7.508 (H) 11/06/2017 1916   PCO2ART 31.4 (L) 11/06/2017 1916   PO2ART 106 11/06/2017 1916   HCO3 20.6 03/22/2021 0404   TCO2 23 03/21/2021 2118   ACIDBASEDEF 3.9 (H)  03/22/2021 0404   O2SAT 89.0 03/22/2021 0404     Coagulation Profile: Recent Labs  Lab 03/21/21 1915  INR 1.1    Cardiac Enzymes: No results for input(s): CKTOTAL, CKMB, CKMBINDEX, TROPONINI in the last 168 hours.  HbA1C: Hgb A1c MFr Bld  Date/Time Value Ref Range Status  02/24/2017 01:51 PM 5.7 4.6 - 6.5 % Final    Comment:    Glycemic Control Guidelines for People with Diabetes:Non Diabetic:  <6%Goal of Therapy: <7%Additional Action Suggested:  >8%     CBG: Recent Labs  Lab 03/21/21 1912 03/22/21 1316 03/22/21 1332 03/22/21 1611 03/23/21 0014  GLUCAP 82 50* 213* 118* 100*    Review of Systems:   Unable to assess   Past Medical History:  She,  has a past medical history of Acute respiratory failure with hypoxia ([place]) (11/05/2017), Arthritis, Colitis, ischemic ([place]), COPD (chronic obstructive pulmonary disease) ([place]), External hemorrhoids, H/O: rheumatic  fever, IBS (irritable bowel syndrome), Personal history colonic adenoma (03/25/2008), Restless leg syndrome, Systemic lupus erythematosus ([place]), Tubular adenoma of colon, and 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   Surgical History:   Past Surgical History:  Procedure Laterality Date   CHOLECYSTECTOMY     CHOLECYSTECTOMY, LAPAROSCOPIC     COLONOSCOPY     ESOPHAGOGASTRODUODENOSCOPY     lesion, vulva excision     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   Procedure: PACEMAKER IMPLANT;  Surgeon: [name], MD;  Location: [facility];  Service: Cardiovascular;  Laterality: N/A;   TONSILLECTOMY     VIDEO BRONCHOSCOPY Bilateral 11/06/2017   Procedure: VIDEO BRONCHOSCOPY WITHOUT FLUORO;  Surgeon: Marshell Garfinkel, MD;  Location: [facility];  Service: Cardiopulmonary;  Laterality: Bilateral;     Social History:   reports that she has been smoking cigarettes. She has a 7.50 pack-year smoking history. She has never used smokeless tobacco. She reports current alcohol use of about 5.0 standard drinks per week. She reports that she does not use drugs.   Family History:  Her family history includes Atrial fibrillation in her father; Colon cancer in her father; Healthy in her daughter; Other in her sister; Stroke in her mother.   Allergies No Known Allergies   Home Medications  Prior to Admission medications   Medication Sig Start Date End Date Taking? Authorizing Provider  albuterol (VENTOLIN HFA) 108 (90 Base) MCG/ACT inhaler Inhale 1-2 puffs into the lungs every 6 (six) hours as needed for wheezing or shortness of breath. 10/13/20  Yes Byrum, Rose Fillers, MD  ANORO ELLIPTA 62.5-25 MCG/ACT AEPB INHALE 1 PUFF INTO THE LUNGS DAILY. Patient taking differently: Inhale 1 puff into the lungs daily. 02/26/21  Yes Collene Gobble, MD  aspirin 81 MG tablet Take 81 mg by mouth at bedtime.   Yes [provider]  benzonatate (TESSALON) 200 MG capsule Take 1 capsule (200 mg total) by mouth every 6  (six) hours as needed for cough. 03/20/21  Yes Collene Gobble, MD  BLACK COHOSH PO Take 1 capsule by mouth daily.   Yes [provider]  calcium carbonate (OS-CAL - DOSED IN MG OF ELEMENTAL CALCIUM) 1250 (500 Ca) MG tablet Take 1 tablet by mouth daily as needed (heartburn).   Yes [provider]  Cholecalciferol (VITAMIN D) 50 MCG (2000 UT) tablet Take 2,000 Units by mouth daily.   Yes [provider]  folic acid (FOLVITE) 1 MG tablet Take 1 mg by mouth daily.   Yes [provider]  furosemide (LASIX) 40  MG tablet Take 1 tablet (40 mg total) by mouth daily. 03/21/21  Yes Martyn Ehrich, NP  gabapentin (NEURONTIN) 300 MG capsule TAKE TWO CAPSULES BY MOUTH EVERY MORNING, TWO CAPSULES EACH AFTERNOON AND THREE CAPSULES AT BEDTIME Patient taking differently: Take 900-1,200 mg by mouth See admin instructions. 900 mg in the morning, and 1200 mg at bedtime 02/27/21  Yes Hoyt Koch, MD  hydroxychloroquine (PLAQUENIL) 200 MG tablet Take 200 mg by mouth daily.   Yes [provider]  ibuprofen (ADVIL) 200 MG tablet Take 400 mg by mouth every 6 (six) hours as needed for headache or mild pain.   Yes [provider]  methotrexate 2.5 MG tablet Take 10 mg by mouth every Thursday.   Yes [provider]  Misc Natural Products (ADVANCED JOINT RELIEF) CAPS Take 1 capsule by mouth daily. Joint Advantage Gold 5x   Yes [provider]  Multiple Vitamin (MULTIVITAMIN WITH MINERALS) TABS tablet Take 1 tablet by mouth daily.   Yes [provider]  Omega-3 Fatty Acids (OMEGA 3 PO) Take 1 capsule by mouth daily.   Yes [provider]  OVER THE COUNTER MEDICATION Take 1 capsule by mouth daily. brain essentials otc supplement   Yes [provider]  RESTASIS 0.05 % ophthalmic emulsion Place 1 drop into both eyes 2 (two) times daily. 07/30/17  Yes [provider]  telmisartan (MICARDIS) 40 MG tablet TAKE TWO  TABLETS BY MOUTH ONCE DAILY Patient taking differently: Take 40 mg by mouth daily. 06/26/20  Yes Burns, Claudina Lick, MD  vitamin C (ASCORBIC ACID) 500 MG tablet Take 500 mg by mouth daily.   Yes [provider]  vitamin E 180 MG (400 UNITS) capsule Take 400 Units by mouth daily.   Yes [provider]  zolpidem (AMBIEN) 5 MG tablet Take 1 tablet (5 mg total) by mouth at bedtime as needed for sleep. 10/30/20  Yes Hoyt Koch, MD  doxycycline (VIBRA-TABS) 100 MG tablet Take 1 tablet (100 mg total) by mouth 2 (two) times daily. Patient not taking: Reported on 03/22/2021 03/21/21   Martyn Ehrich, NP     Critical care time:   CRITICAL CARE Performed by: Rook Maue D. Harris  Total critical care time: 50 minutes  Critical care time was exclusive of separately billable procedures and treating other patients.  Critical care was necessary to treat or prevent imminent or life-threatening deterioration.  Critical care was time spent personally by me on the following activities: development of treatment plan with patient and/or surrogate as well as nursing, discussions with consultants, evaluation of patient's response to treatment, examination of patient, obtaining history from patient or surrogate, ordering and performing treatments and interventions, ordering and review of laboratory studies, ordering and review of radiographic studies, pulse oximetry and re-evaluation of patient's condition.  Malaika Arnall D. Kenton Kingfisher, NP-C [facility] Personal contact information can be found on Amion  03/24/2021, 9:25 AM

## 2021-03-24 NOTE — Progress Notes (Signed)
Secure chat with Dr Carolin Sicks, states ok to place midline.

## 2021-03-24 NOTE — Progress Notes (Signed)
ANTICOAGULATION CONSULT NOTE   Pharmacy Consult for heparin Indication: atrial fibrillation  No Known Allergies  Patient Measurements: Height: 5\' 2"  (157.5 cm) Weight: 57 kg (125 lb 10.6 oz) IBW/kg (Calculated) : 50.1 Heparin Dosing Weight: 60kg  Vital Signs: Temp: 98.5 F (36.9 C) (12/03 1543) Temp Source: Oral (12/03 1543) BP: 123/94 (12/03 1600) Pulse Rate: 144 (12/03 1600)  Labs: Recent Labs    03/21/21 1915 03/21/21 2118 03/23/21 0039 03/23/21 0744 03/23/21 1744 03/24/21 0011 03/24/21 0907 03/24/21 1523  HGB 12.1   < > 11.9*  --   --  10.8* 10.7*  --   HCT 33.8*   < > 32.8*  --   --  29.3* 29.6*  --   PLT 152   < > 169  --   --  167 168  --   APTT 31  --   --   --   --   --   --   --   LABPROT 14.3  --   --   --   --   --   --   --   INR 1.1  --   --   --   --   --   --   --   HEPARINUNFRC  --    < > <0.10*   < > 0.22* 0.26*  --  0.37  CREATININE 1.86*   < > 2.03*  --   --  1.60* 1.75*  --    < > = values in this interval not displayed.     Estimated Creatinine Clearance: 23.3 mL/min (A) (by C-G formula based on SCr of 1.75 mg/dL (H)).   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 Patient now with afib with rvr this afternoon. She was not on anticoagulation prior to admit and was receiving sq heparin for prophylaxis since admit. New orders to transition patient over to IV heparin.  Hemoglobin has remained stable ~11 this admit, no bleeding issues have been noted.   Heparin level this afternoon within goal range.  No overt bleeding or complications noted.  Goal of Therapy:  Heparin level 0.3-0.7 units/ml Monitor platelets by anticoagulation protocol: Yes   Plan:  Continue IV heparin at 1350 units/hr.  Repeat heparin level with AM labs. Daily heparin level and CBC.  [place] Crane, Roylene Reason, BCCP Clinical Pharmacist  03/24/2021 4:42 PM   [facility] pharmacy phone numbers are listed on [email or web address]

## 2021-03-24 NOTE — Progress Notes (Signed)
ANTICOAGULATION CONSULT NOTE   Pharmacy Consult for heparin Indication: atrial fibrillation  No Known Allergies  Patient Measurements: Height: 5\' 2"  (157.5 cm) Weight: 58.6 kg (129 lb 3 oz) IBW/kg (Calculated) : 50.1 Heparin Dosing Weight: 60kg  Vital Signs: Temp: 100.5 F (38.1 C) (12/03 0212) Temp Source: Oral (12/03 0212) BP: 166/83 (12/03 0212) Pulse Rate: 150 (12/03 0212)  Labs: Recent Labs    03/21/21 1915 03/21/21 2118 03/22/21 1431 03/22/21 1939 03/22/21 2259 03/23/21 0039 03/23/21 0744 03/23/21 1744 03/24/21 0011  HGB 12.1   < > 12.8  --   --  11.9*  --   --  10.8*  HCT 33.8*   < > 36.6  --   --  32.8*  --   --  29.3*  PLT 152   < > 153  --   --  169  --   --  167  APTT 31  --   --   --   --   --   --   --   --   LABPROT 14.3  --   --   --   --   --   --   --   --   INR 1.1  --   --   --   --   --   --   --   --   HEPARINUNFRC  --   --   --   --    < > <0.10* 0.18* 0.22* 0.26*  CREATININE 1.86*   < > 2.11* 2.07*  --  2.03*  --   --  1.60*   < > = values in this interval not displayed.     Estimated Creatinine Clearance: 25.5 mL/min (A) (by C-G formula based on SCr of 1.6 mg/dL (H)).   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 Patient now with afib with rvr this afternoon. She was not on anticoagulation prior to admit and was receiving sq heparin for prophylaxis since admit. New  orders to transition patient over to IV heparin. Hemoglobin has remained stable ~11 this admit, no bleeding issues have been noted.   12/3 AM update:  Heparin level low but trending up  Goal of Therapy:  Heparin level 0.3-0.7 units/ml Monitor platelets by anticoagulation protocol: Yes   Plan:  Heparin 1000 units re-[employer] heparin to 1350 units/hr Heparin level in 8 hours  Narda Bonds, PharmD, [place] Pharmacist Phone: [phone]

## 2021-03-25 ENCOUNTER — Inpatient Hospital Stay (HOSPITAL_COMMUNITY): Payer: PPO

## 2021-03-25 DIAGNOSIS — I480 Paroxysmal atrial fibrillation: Secondary | ICD-10-CM

## 2021-03-25 DIAGNOSIS — I5031 Acute diastolic (congestive) heart failure: Secondary | ICD-10-CM | POA: Diagnosis not present

## 2021-03-25 DIAGNOSIS — J432 Centrilobular emphysema: Secondary | ICD-10-CM | POA: Diagnosis not present

## 2021-03-25 DIAGNOSIS — N179 Acute kidney failure, unspecified: Secondary | ICD-10-CM | POA: Diagnosis not present

## 2021-03-25 DIAGNOSIS — G9341 Metabolic encephalopathy: Secondary | ICD-10-CM | POA: Diagnosis not present

## 2021-03-25 LAB — RENAL FUNCTION PANEL
Albumin: 2.9 g/dL — ABNORMAL LOW (ref 3.5–5.0)
Anion gap: 15 (ref 5–15)
BUN: 29 mg/dL — ABNORMAL HIGH (ref 8–23)
CO2: 20 mmol/L — ABNORMAL LOW (ref 22–32)
Calcium: 7.9 mg/dL — ABNORMAL LOW (ref 8.9–10.3)
Chloride: 88 mmol/L — ABNORMAL LOW (ref 98–111)
Creatinine, Ser: 1.55 mg/dL — ABNORMAL HIGH (ref 0.44–1.00)
GFR, Estimated: 36 mL/min — ABNORMAL LOW (ref >60)
Glucose, Bld: 309 mg/dL — ABNORMAL HIGH (ref 70–99)
Phosphorus: 3 mg/dL (ref 2.5–4.6)
Potassium: 4 mmol/L (ref 3.5–5.1)
Sodium: 123 mmol/L — ABNORMAL LOW (ref 135–145)

## 2021-03-25 LAB — GLUCOSE, CAPILLARY
Glucose-Capillary: 145 mg/dL — ABNORMAL HIGH (ref 70–99)
Glucose-Capillary: 147 mg/dL — ABNORMAL HIGH (ref 70–99)

## 2021-03-25 LAB — HEPARIN LEVEL (UNFRACTIONATED)
Heparin Unfractionated: 0.24 IU/mL — ABNORMAL LOW (ref 0.30–0.70)
Heparin Unfractionated: 0.32 IU/mL (ref 0.30–0.70)
Heparin Unfractionated: 0.23 IU/mL — ABNORMAL LOW (ref 0.30–0.70)

## 2021-03-25 LAB — CBC
HCT: 29.3 % — ABNORMAL LOW (ref 36.0–46.0)
Hemoglobin: 10.8 g/dL — ABNORMAL LOW (ref 12.0–15.0)
MCH: 32.5 pg (ref 26.0–34.0)
MCHC: 36.9 g/dL — ABNORMAL HIGH (ref 30.0–36.0)
MCV: 88.3 fL (ref 80.0–100.0)
Platelets: 162 10*3/uL (ref 150–400)
RBC: 3.32 MIL/uL — ABNORMAL LOW (ref 3.87–5.11)
RDW: 14.1 % (ref 11.5–15.5)
WBC: 16.9 10*3/uL — ABNORMAL HIGH (ref 4.0–10.5)
nRBC: 0 % (ref 0.0–0.2)

## 2021-03-25 MED ORDER — FUROSEMIDE 10 MG/ML IJ SOLN
60.0000 mg | Freq: Once | INTRAMUSCULAR | Status: AC
Start: 2021-03-25 — End: 2021-03-25
  Administered 2021-03-25: 14:00:00 60 mg via INTRAVENOUS
  Filled 2021-03-25: qty 6

## 2021-03-25 NOTE — Progress Notes (Signed)
0110 patient converted to a sinus brady.   [facility] called and cardizem decreased.   Sheila Morales called and verbal order to stop cardizem and Amiodarone for one hr and then restart amiodarone at 0.5mg /min

## 2021-03-25 NOTE — Progress Notes (Signed)
NAME:  Sheila Morales, MRN:  [id number], DOB:  12/15/1950, LOS: 3 ADMISSION DATE:  03/21/2021, CONSULTATION DATE:  03/24/2021 REFERRING MD:  Dr. Alfredia Ferguson, CHIEF COMPLAINT: A. fib RVR with AMS  History of Present Illness:  Sheila Morales is a 70 y.o. with a past medical history significant for COPD, CHF, systemic lupus erythematosus, and ischemic colitis who presented to the emergency department 11/30 for complaints of dyspnea, fatigue, weakness, and altered mental status.  Per husband symptoms began 4 days prior to admission with a worrisome symptom of cough and malaise.  States he attempted to seek medical treatment for patient at local ER/urgent care but t when the wait time exceeded 11 hours they decided to leave and not be seen.  Morning of 12/3 patent has again developed worsening mentation with A-fib RVR with recurrent low-grade fever, tachycardia, and hypertension.  PCCM consulted for further management and transfer to ICU  Pertinent  Medical History  COPD CHF Systemic lupus erythematosus Ischemic colitis  Significant Hospital Events: Including procedures, antibiotic start and stop dates in addition to other pertinent events   11/30 admitted for generalized fatigue, weakness, and altered mental status found to be bacteremic with E. coli.  Patient was also with several metabolic derangements including severely hyponatremic with a sodium of 117 12/3 transfer to ICU for acute encephalopathy and AF/RVR  Interim History / Subjective:  Came off BiPAP this morning. Napping. Still lethargic. No abdominal pain  Objective   Blood pressure (!) 125/58, pulse 62, temperature 97.8 F (36.6 C), temperature source Axillary, resp. rate 18, height 5\' 2"  (1.575 m), weight 58.3 kg, SpO2 95 %.    FiO2 (%):  [50 %-99 %] 50 %   Intake/Output Summary (Last 24 hours) at 03/25/2021 1242 Last data filed at 03/25/2021 0800 Gross per 24 hour  Intake 728.34 ml  Output 1300 ml  Net -571.66 ml   Filed Weights    03/23/21 0546 03/24/21 0329 03/25/21 0500  Weight: 58.6 kg 57 kg 58.3 kg    Examination: General: NAD, drowsy but arousable with loud verbal HEENT: [place]/AT, MMM, PERRL Neuro: follows commands all ext CV: RRR, no murmur PULM:  Diminished bilaterally, prolonged expiratory phase GI: soft, bowel sounds hypoactive in all 4 quadrants, tender to palpation of RUQ, non-distended Extremities: warm/dry, no edema  Skin: no rashes or lesions  BCx 11/30 with pansensitive E coli BCx 12/2, 12/3 NGTD S Cr essentially stable 1.55 Na 123  Resolved Hospital Problem list     Assessment & Plan:  Acute Hypoxic and hypercapnic Respiratory Failure  -Felt secondary to possible CAP versus pulmonary edema -Chest CT on admission negative for signs of acute pneumonia revealed mild emphysema with questionable mild degree of fibrosis and cardiomegaly Hx of COPD with daily tobacco use  P: Pulmonary hygiene, bronchodilators Elevate head of bed at Aspiration precautions Continue empiric antibiotics  Diurese today for goal net negative fluid balance Husband states patient would want to be intubated if needed but patient would not want prolonged aggressive measures  E. Coli bacteremia  -Felt secondary to UTI in immunocompromised patient P: ID following, appreciate assistance Continue to follow blood cultures Continue IV ceftriaxone 11/30-  Acute on chronic diastolic heart failure -ECHO 12/1 with EF 70-75 and grade II diastolic dysfunction  Hx of HTN New onset A. fib RVR -Felt secondary to sepsis History of sick sinus syndrome s/p permanent pacemaker P: Cardiology following, appreciate assistance Continuous telemetry  Strict intake and output  Daily weight to assess volume  status Diurese today for goal net negative fluid balance Continue amiodarone Continue heparin drip  Acute metabolic encephalopathy P: Delirium, aspiration precautions Minimize sedating medications  Acute Kidney Injury   -Creatinine 0.70 29 April 2020, creatinine on admission 1.86 with peak of 2.11 during admission  Acute on chronic hyponatremia  P: Nephrology following, appreciate assistance  Follow renal function  Monitor urine output Trend Bmet Avoid nephrotoxins Ensure adequate renal perfusion   Hx of SLE -Home medications include Plaquenil and Methotrexate  P: Medication remains on hold Supportive care  Normocytic anemia  P: Trend CBC  Transfuse per protocol  Hgb goal greater than 7   Best Practice (right click and "Reselect all SmartList Selections" daily)   Diet/type: Regular consistency (see orders) DVT prophylaxis: systemic heparin GI prophylaxis: PPI Lines: N/A Foley:  N/A Code Status:  full code Last date of multidisciplinary goals of care discussion: Husband updated at bedside      Critical care time:   CRITICAL CARE Performed by: Maryjane Hurter  Total critical care time: 25 minutes  Critical care time was exclusive of separately billable procedures and treating other patients.  Critical care was necessary to treat or prevent imminent or life-threatening deterioration.  Critical care was time spent personally by me on the following activities: development of treatment plan with patient and/or surrogate as well as nursing, discussions with consultants, evaluation of patient's response to treatment, examination of patient, obtaining history from patient or surrogate, ordering and performing treatments and interventions, ordering and review of laboratory studies, ordering and review of radiographic studies, pulse oximetry and re-evaluation of patient's condition.  [facility]  Personal contact information can be found on Amion  03/25/2021, 12:42 PM

## 2021-03-25 NOTE — Progress Notes (Signed)
ANTICOAGULATION CONSULT NOTE   Pharmacy Consult for heparin Indication: atrial fibrillation  No Known Allergies  Patient Measurements: Height: 5\' 2"  (157.5 cm) Weight: 58.3 kg (128 lb 8.5 oz) IBW/kg (Calculated) : 50.1 Heparin Dosing Weight: 60kg  Vital Signs: Temp: 97.8 F (36.6 C) (12/04 0721) Temp Source: Axillary (12/04 0721) BP: 125/58 (12/04 1200) Pulse Rate: 62 (12/04 1200)  Labs: Recent Labs    03/24/21 0011 03/24/21 0907 03/24/21 1523 03/25/21 0123 03/25/21 1130  HGB 10.8* 10.7*  --  10.8*  --   HCT 29.3* 29.6*  --  29.3*  --   PLT 167 168  --  162  --   HEPARINUNFRC 0.26*  --  0.37 0.24* 0.32  CREATININE 1.60* 1.75*  --  1.55*  --      Estimated Creatinine Clearance: 26.3 mL/min (A) (by C-G formula based on SCr of 1.55 mg/dL (H)).   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 Patient now with afib with rvr this afternoon. She was not on anticoagulation prior to admit and was receiving sq heparin for prophylaxis since admit. New orders to transition patient over to IV heparin. Hemoglobin has remained stable ~11 this admit, no bleeding issues have been noted.   Heparin level this afternoon within goal range.  No overt bleeding or complications noted.  Goal of Therapy:  Heparin level 0.3-0.7 units/ml Monitor platelets by anticoagulation protocol: Yes   Plan:  Continue IV heparin at  1450 units/hr.  Heparin level @2000  Daily heparin level and CBC.  Thank you for allowing pharmacy to be a part of this patient's care.  Donnald Garre, PharmD Clinical Pharmacist  Please check AMION for all [place] numbers After 10:00 PM, call [place] [phone]

## 2021-03-25 NOTE — Progress Notes (Signed)
ANTICOAGULATION CONSULT NOTE  Pharmacy Consult for heparin Indication: atrial fibrillation  No Known Allergies  Patient Measurements: Height: 5\' 2"  (157.5 cm) Weight: 57 kg (125 lb 10.6 oz) IBW/kg (Calculated) : 50.1 Heparin Dosing Weight: 60kg  Vital Signs: Temp: 99.6 F (37.6 C) (12/03 2303) Temp Source: Axillary (12/03 2303) BP: 114/70 (12/04 0000) Pulse Rate: 94 (12/04 0000)  Labs: Recent Labs    03/24/21 0011 03/24/21 0907 03/24/21 1523 03/25/21 0123  HGB 10.8* 10.7*  --  10.8*  HCT 29.3* 29.6*  --  29.3*  PLT 167 168  --  162  HEPARINUNFRC 0.26*  --  0.37 0.24*  CREATININE 1.60* 1.75*  --  1.55*     Estimated Creatinine Clearance: 26.3 mL/min (A) (by C-G formula based on SCr of 1.55 mg/dL (H)).   Assessment: 70 y.o. female with Afib for heparin  Heparin level subtherapeutic   Goal of Therapy:  Heparin level 0.3-0.7 units/ml Monitor platelets by anticoagulation protocol: Yes   Plan:  Increase Heparin  1450 units/hr  Phillis Knack, PharmD, BCPS

## 2021-03-25 NOTE — Progress Notes (Signed)
eLink Physician-Brief Progress Note Patient Name: Sheila Morales DOB: 1951/01/18 MRN: [id number]   Date of Service  03/25/2021  HPI/Events of Note  had been Afib with RVR on high dose mio gtt adn cardizem gtt @ 15mg   now converted, rate 50s. I told the nurse to start titrating down the cardizem first.   you ok with the high dose amio still   Hx sick sinus syndrome s/p PPM   Discussed. Electrolytes are at goal.    eICU Interventions  - hold both amiodarone and cardizem for an hour. Start amiodarone gtt at 0.5 mg/hr if HR is over 80 or so. Follow labs in AM, replace if low K or Mag level.      Intervention Category Intermediate Interventions: Arrhythmia - evaluation and management  Elmer Sow 03/25/2021, 1:48 AM

## 2021-03-25 NOTE — Progress Notes (Signed)
[facility] NEPHROLOGY PROGRESS NOTE  Assessment/ Plan: Pt is a 70 y.o. yo female   with history of COPD, IBS, HLD, ischemic colitis, admitted on 12/1 for pneumonia, E. coli bacteremia and sepsis.  We are consulted for acute on chronic hyponatremia and AKI.  Initially, the patient was thought to have acute on chronic heart failure therefore treated with IV Lasix.  The the creatinine level and sodium level worsened therefore it was discontinued.   # Acute kidney injury, nonoliguric likely hemodynamically mediated in the setting of hypotension: ARB, diuretics and sepsis. UA and kidney ultrasound unremarkable.  ARB and Lasix held.   She remains nonoliguric and creatinine level trending down to 1.55 today.  No sign of peripheral edema therefore recommend to hold diuretics.  Continue strict ins and out and daily lab.   #Acute on chronic hyponatremia: Urinary studies pointing towards SIADH. TSH within normal range. Continue fluid restriction and added salt tablet on 12/3.  Monitor lab.  #Acute metabolic encephalopathy probably in the setting of pneumonia and infection.  No focal deficit.  Continue to monitor.  #Hypokalemia: Potassium level improved after repletion.  #E. coli bacteremia suspected due to UTI: Seen by ID repeat culture on 11/30, continue ceftriaxone.  #A. fib with RVR: Seen by cardiologist.  Currently on amiodarone drip, diltiazem and heparin drip.  Heart rate improved.  Discussed with ICU team.  Subjective: Seen and examined at bedside.  Noted she was transferred to ICU for A. fib with RVR.  She has urine output around 1.3 L in 24 hours.  She is alert awake and feels weak.  Denies nausea, vomiting, chest pain, shortness of breath.  Her husband at bedside. Objective Vital signs in last 24 hours: Vitals:   03/25/21 0600 03/25/21 0700 03/25/21 0721 03/25/21 0800  BP: (!) 123/57 (!) 133/57  116/62  Pulse: (!) 59 (!) 51 (!) 39 (!) 40  Resp: 18 (!) 31 16 (!) 24  Temp:    97.8 F (36.6 C)   TempSrc:   Axillary   SpO2: 100% 98% 99% 95%  Weight:      Height:       Weight change: 1.3 kg  Intake/Output Summary (Last 24 hours) at 03/25/2021 0919 Last data filed at 03/25/2021 0800 Gross per 24 hour  Intake 938.84 ml  Output 1300 ml  Net -361.16 ml        Labs: Basic Metabolic Panel: Recent Labs  Lab 03/24/21 0011 03/24/21 0907 03/25/21 0123  NA 122* 125* 123*  K 3.3* 4.2 4.0  CL 90* 91* 88*  CO2 21* 20* 20*  GLUCOSE 152* 124* 309*  BUN 28* 28* 29*  CREATININE 1.60* 1.75* 1.55*  CALCIUM 8.2* 8.6* 7.9*  PHOS 2.9 3.2 3.0    Liver Function Tests: Recent Labs  Lab 03/23/21 0039 03/24/21 0011 03/24/21 0907 03/25/21 0123  AST 31 27 26   --   ALT 23 20 18   --   ALKPHOS 118 90 84  --   BILITOT 0.5 0.3 0.6  --   PROT 6.4* 7.0 6.6  --   ALBUMIN 2.7* 3.4* 3.6 2.9*    No results for input(s): LIPASE, AMYLASE in the last 168 hours. Recent Labs  Lab 03/21/21 1915  AMMONIA 11    CBC: Recent Labs  Lab 03/22/21 1431 03/23/21 0039 03/24/21 0011 03/24/21 0907 03/25/21 0123  WBC 19.1* 16.1* 15.5* 15.1* 16.9*  NEUTROABS 16.4* 13.0* 12.0* 11.4*  --   HGB 12.8 11.9* 10.8* 10.7* 10.8*  HCT  36.6 32.8* 29.3* 29.6* 29.3*  MCV 92.2 89.9 87.5 87.8 88.3  PLT 153 169 167 168 162    Cardiac Enzymes: No results for input(s): CKTOTAL, CKMB, CKMBINDEX, TROPONINI in the last 168 hours. CBG: Recent Labs  Lab 03/23/21 0014 03/24/21 1013 03/24/21 1152 03/24/21 1543 03/24/21 1932  GLUCAP 100* 113* 119* 127* 117*     Iron Studies: No results for input(s): IRON, TIBC, TRANSFERRIN, FERRITIN in the last 72 hours. Studies/Results: DG Chest Port 1 View  Result Date: 03/25/2021 CLINICAL DATA:  Respiratory distress. EXAM: PORTABLE CHEST 1 VIEW COMPARISON:  Chest x-rays dated 03/24/2021 and 03/23/2021. FINDINGS: 0354 hours. Patchy bilateral airspace opacities are stable, again most confluent within the RIGHT upper lung. No pleural effusion or  pneumothorax is seen. Heart size and mediastinal contours are stable. Pacer pad overlies the LEFT heart border. LEFT chest wall pacemaker/ICD apparatus appears stable in position. IMPRESSION: Stable chest x-ray. Patchy bilateral airspace opacities are stable, again most confluent within the RIGHT upper lung, compatible with multifocal pneumonia and/or pulmonary edema. Electronically Signed   By: Franki Cabot M.D.   On: 03/25/2021 07:59   DG CHEST PORT 1 VIEW  Result Date: 03/24/2021 CLINICAL DATA:  Shortness of breath EXAM: PORTABLE CHEST 1 VIEW COMPARISON:  03/23/2021 FINDINGS: Persistent bilateral pulmonary opacities. Similar lung aeration. No significant pleural effusion. No pneumothorax. Stable mild cardiomegaly. IMPRESSION: Persistent bilateral pulmonary opacities reflecting pulmonary edema or multifocal pneumonia. Electronically Signed   By: Macy Mis M.D.   On: 03/24/2021 11:07   DG CHEST PORT 1 VIEW  Result Date: 03/23/2021 CLINICAL DATA:  Shortness of breath EXAM: PORTABLE CHEST 1 VIEW COMPARISON:  03/21/2021 FINDINGS: Moderate interstitial pulmonary edema, greatest in the right upper lobe. Mild cardiomegaly. No pneumothorax. IMPRESSION: Cardiomegaly and moderate interstitial pulmonary edema. Electronically Signed   By: Ulyses Jarred M.D.   On: 03/23/2021 22:15    Medications: Infusions:  sodium chloride Stopped (03/24/21 0929)   amiodarone Stopped (03/25/21 0149)   cefTRIAXone (ROCEPHIN)  IV Stopped (03/24/21 1906)   diltiazem (CARDIZEM) infusion Stopped (03/25/21 0149)   heparin 1,450 Units/hr (03/25/21 0800)    Scheduled Medications:  aspirin EC  81 mg Oral Daily   Chlorhexidine Gluconate Cloth  6 each Topical Daily   folic acid  1 mg Oral Daily   ipratropium  0.5 mg Nebulization Q6H   levalbuterol  0.63 mg Nebulization Q6H   mouth rinse  15 mL Mouth Rinse BID   sodium chloride flush  10-40 mL Intracatheter Q12H   sodium chloride  1 g Oral TID WC    have reviewed  scheduled and prn medications.  Physical Exam: General: Sitting on bed, not in distress Heart:RRR, s1s2 nl Lungs: Some basal rhonchi, no increased work of breathing Abdomen:soft, Non-tender, non-distended Extremities:No peripheral edema Neurology: Alert awake and following commands  Adante Courington [employer] 03/25/2021,9:19 AM  LOS: 3 days

## 2021-03-25 NOTE — Progress Notes (Signed)
Progress Note  Patient Name: Sheila Morales Date of Encounter: 03/25/2021  [facility] Cardiologist: Elouise Munroe, MD    Subjective    70 yo with hx of chronic diastolic CHF, COPD, ecoli bacteremia,  new on set Afib , pacer for sss.  Echo shows hyperdynamic LV EF of 75%.  Has altered waxing and waning  mental status  Was in rapid Afib yesterday  We added Dilt drip. She is back in sinus rhythm this am  Asleep,  did not wake up while I was examining her   Inpatient Medications    Scheduled Meds:  aspirin EC  81 mg Oral Daily   Chlorhexidine Gluconate Cloth  6 each Topical Daily   folic acid  1 mg Oral Daily   ipratropium  0.5 mg Nebulization Q6H   levalbuterol  0.63 mg Nebulization Q6H   mouth rinse  15 mL Mouth Rinse BID   sodium chloride flush  10-40 mL Intracatheter Q12H   sodium chloride  1 g Oral TID WC   Continuous Infusions:  sodium chloride Stopped (03/24/21 0929)   amiodarone Stopped (03/25/21 0149)   cefTRIAXone (ROCEPHIN)  IV Stopped (03/24/21 1906)   diltiazem (CARDIZEM) infusion Stopped (03/25/21 0149)   heparin 1,450 Units/hr (03/25/21 0800)   PRN Meds: sodium chloride, acetaminophen **OR** acetaminophen, adenosine (ADENOCARD) IV, albuterol, benzonatate, diphenhydrAMINE, labetalol, lip balm, morphine injection, nitroGLYCERIN, ondansetron **OR** ondansetron (ZOFRAN) IV, oxyCODONE, sodium chloride flush   Vital Signs    Vitals:   03/25/21 0700 03/25/21 0721 03/25/21 0800 03/25/21 0923  BP: (!) 133/57  116/62   Pulse: (!) 51 (!) 39 (!) 40   Resp: (!) 31 16 (!) 24   Temp:  97.8 F (36.6 C)    TempSrc:  Axillary    SpO2: 98% 99% 95% 97%  Weight:      Height:        Intake/Output Summary (Last 24 hours) at 03/25/2021 1027 Last data filed at 03/25/2021 0800 Gross per 24 hour  Intake 728.34 ml  Output 1300 ml  Net -571.66 ml    Last 3 Weights 03/25/2021 03/24/2021 03/23/2021  Weight (lbs) 128 lb 8.5 oz 125 lb 10.6 oz 129 lb 3 oz  Weight (kg)  58.3 kg 57 kg 58.6 kg      Telemetry    Rapid atrial flutter - Personally Reviewed  ECG     - Personally Reviewed  Physical Exam     Physical Exam: Blood pressure 116/62, pulse (!) 40, temperature 97.8 F (36.6 C), temperature source Axillary, resp. rate (!) 24, height 5\' 2"  (1.575 m), weight 58.3 kg, SpO2 97 %.  GEN:  elderly female,    HEENT: Normal NECK: No JVD; No carotid bruits LYMPHATICS: No lymphadenopathy CARDIAC: regularly irregular ( bigeminal pattern )  RESPIRATORY:  Clear to auscultation without rales, wheezing or rhonchi  ABDOMEN: Soft, non-tender, non-distended MUSCULOSKELETAL:  No edema; No deformity  SKIN: Warm and dry NEUROLOGIC:  Alert and oriented x 3   Labs    High Sensitivity Troponin:  No results for input(s): TROPONINIHS in the last 720 hours.   Chemistry Recent Labs  Lab 03/23/21 0039 03/24/21 0011 03/24/21 0907 03/25/21 0123  NA 122* 122* 125* 123*  K 3.7 3.3* 4.2 4.0  CL 89* 90* 91* 88*  CO2 20* 21* 20* 20*  GLUCOSE 96 152* 124* 309*  BUN 35* 28* 28* 29*  CREATININE 2.03* 1.60* 1.75* 1.55*  CALCIUM 8.3* 8.2* 8.6* 7.9*  MG 1.8 2.2 2.1  --  PROT 6.4* 7.0 6.6  --   ALBUMIN 2.7* 3.4* 3.6 2.9*  AST 31 27 26   --   ALT 23 20 18   --   ALKPHOS 118 90 84  --   BILITOT 0.5 0.3 0.6  --   GFRNONAA 26* 34* 31* 36*  ANIONGAP 13 11 14 15      Lipids No results for input(s): CHOL, TRIG, HDL, LABVLDL, LDLCALC, CHOLHDL in the last 168 hours.  Hematology Recent Labs  Lab 03/24/21 0011 03/24/21 0907 03/25/21 0123  WBC 15.5* 15.1* 16.9*  RBC 3.35* 3.37* 3.32*  HGB 10.8* 10.7* 10.8*  HCT 29.3* 29.6* 29.3*  MCV 87.5 87.8 88.3  MCH 32.2 31.8 32.5  MCHC 36.9* 36.1* 36.9*  RDW 13.4 13.7 14.1  PLT 167 168 162    Thyroid  Recent Labs  Lab 03/22/21 0648  TSH 0.909     BNP Recent Labs  Lab 03/21/21 1206 03/21/21 1915 03/22/21 0648 03/24/21 0011  BNP  --  1,399.1* 1,498.1* 2,707.6*  PROBNP 1,279.0*  --   --   --      DDimer No  results for input(s): DDIMER in the last 168 hours.   Radiology    DG Chest Port 1 View  Result Date: 03/25/2021 CLINICAL DATA:  Respiratory distress. EXAM: PORTABLE CHEST 1 VIEW COMPARISON:  Chest x-rays dated 03/24/2021 and 03/23/2021. FINDINGS: 0354 hours. Patchy bilateral airspace opacities are stable, again most confluent within the RIGHT upper lung. No pleural effusion or pneumothorax is seen. Heart size and mediastinal contours are stable. Pacer pad overlies the LEFT heart border. LEFT chest wall pacemaker/ICD apparatus appears stable in position. IMPRESSION: Stable chest x-ray. Patchy bilateral airspace opacities are stable, again most confluent within the RIGHT upper lung, compatible with multifocal pneumonia and/or pulmonary edema. Electronically Signed   By: Franki Cabot M.D.   On: 03/25/2021 07:59   DG CHEST PORT 1 VIEW  Result Date: 03/24/2021 CLINICAL DATA:  Shortness of breath EXAM: PORTABLE CHEST 1 VIEW COMPARISON:  03/23/2021 FINDINGS: Persistent bilateral pulmonary opacities. Similar lung aeration. No significant pleural effusion. No pneumothorax. Stable mild cardiomegaly. IMPRESSION: Persistent bilateral pulmonary opacities reflecting pulmonary edema or multifocal pneumonia. Electronically Signed   By: Macy Mis M.D.   On: 03/24/2021 11:07   DG CHEST PORT 1 VIEW  Result Date: 03/23/2021 CLINICAL DATA:  Shortness of breath EXAM: PORTABLE CHEST 1 VIEW COMPARISON:  03/21/2021 FINDINGS: Moderate interstitial pulmonary edema, greatest in the right upper lobe. Mild cardiomegaly. No pneumothorax. IMPRESSION: Cardiomegaly and moderate interstitial pulmonary edema. Electronically Signed   By: Ulyses Jarred M.D.   On: 03/23/2021 22:15    Cardiac Studies      Patient Profile     70 y.o. female with COPD, chronic diastolic CHF, COPD, e coli bacteremia with rapid atrial flutter   Assessment & Plan      Atrial fib / flutter with RVR - cont amio.   Has converted back to sinus  rhythm.  Cont amiodarone. Cont heparin   Cont to follow     For questions or updates, please contact [place] Please consult [email or web address] for contact info under        Signed, Mertie Moores, MD  03/25/2021, 10:27 AM

## 2021-03-25 NOTE — Progress Notes (Signed)
ANTICOAGULATION CONSULT NOTE   Pharmacy Consult for heparin Indication: atrial fibrillation  No Known Allergies  Patient Measurements: Height: 5\' 2"  (157.5 cm) Weight: 58.3 kg (128 lb 8.5 oz) IBW/kg (Calculated) : 50.1 Heparin Dosing Weight: 60kg  Vital Signs: Temp: 99.8 F (37.7 C) (12/04 1948) Temp Source: Oral (12/04 1948) BP: 139/86 (12/04 2043) Pulse Rate: 125 (12/04 2043)  Labs: Recent Labs    03/24/21 0011 03/24/21 0907 03/24/21 1523 03/25/21 0123 03/25/21 1130 03/25/21 2105  HGB 10.8* 10.7*  --  10.8*  --   --   HCT 29.3* 29.6*  --  29.3*  --   --   PLT 167 168  --  162  --   --   HEPARINUNFRC 0.26*  --    < > 0.24* 0.32 0.23*  CREATININE 1.60* 1.75*  --  1.55*  --   --    < > = values in this interval not displayed.     Estimated Creatinine Clearance: 26.3 mL/min (A) (by C-G formula based on SCr of 1.55 mg/dL (H)).   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 Patient now with afib with rvr this afternoon. She was not on anticoagulation prior to admit and was receiving sq heparin for prophylaxis since admit. New orders to transition patient over to IV heparin. Hemoglobin has remained stable ~11 this admit, no bleeding issues have been noted.   Heparin level this evening slightly below goal at 0.23.  No overt bleeding or complications noted.  Goal of Therapy:  Heparin level 0.3-0.7  units/ml Monitor platelets by anticoagulation protocol: Yes   Plan:  Increase IV heparin to 1550 units/hr Repeat heparin level in 8 hrs. Daily heparin level and CBC.  [place] Crane, Roylene Reason, BCCP Clinical Pharmacist  03/25/2021 10:12 PM   [facility] pharmacy phone numbers are listed on [email or web address]

## 2021-03-26 ENCOUNTER — Ambulatory Visit: Payer: PPO | Admitting: Internal Medicine

## 2021-03-26 ENCOUNTER — Inpatient Hospital Stay (HOSPITAL_COMMUNITY): Payer: PPO

## 2021-03-26 DIAGNOSIS — I509 Heart failure, unspecified: Secondary | ICD-10-CM

## 2021-03-26 DIAGNOSIS — J431 Panlobular emphysema: Secondary | ICD-10-CM

## 2021-03-26 DIAGNOSIS — R0603 Acute respiratory distress: Secondary | ICD-10-CM | POA: Diagnosis not present

## 2021-03-26 DIAGNOSIS — I5031 Acute diastolic (congestive) heart failure: Secondary | ICD-10-CM | POA: Diagnosis not present

## 2021-03-26 LAB — CBC
HCT: 29.7 % — ABNORMAL LOW (ref 36.0–46.0)
Hemoglobin: 10.8 g/dL — ABNORMAL LOW (ref 12.0–15.0)
MCH: 32 pg (ref 26.0–34.0)
MCHC: 36.4 g/dL — ABNORMAL HIGH (ref 30.0–36.0)
MCV: 88.1 fL (ref 80.0–100.0)
Platelets: 221 10*3/uL (ref 150–400)
RBC: 3.37 MIL/uL — ABNORMAL LOW (ref 3.87–5.11)
RDW: 14.6 % (ref 11.5–15.5)
WBC: 19.7 10*3/uL — ABNORMAL HIGH (ref 4.0–10.5)
nRBC: 0 % (ref 0.0–0.2)

## 2021-03-26 LAB — HEPARIN LEVEL (UNFRACTIONATED): Heparin Unfractionated: 0.31 IU/mL (ref 0.30–0.70)

## 2021-03-26 LAB — BASIC METABOLIC PANEL
Anion gap: 12 (ref 5–15)
BUN: 40 mg/dL — ABNORMAL HIGH (ref 8–23)
CO2: 23 mmol/L (ref 22–32)
Calcium: 8.2 mg/dL — ABNORMAL LOW (ref 8.9–10.3)
Chloride: 89 mmol/L — ABNORMAL LOW (ref 98–111)
Creatinine, Ser: 1.6 mg/dL — ABNORMAL HIGH (ref 0.44–1.00)
GFR, Estimated: 34 mL/min — ABNORMAL LOW (ref >60)
Glucose, Bld: 243 mg/dL — ABNORMAL HIGH (ref 70–99)
Potassium: 3.1 mmol/L — ABNORMAL LOW (ref 3.5–5.1)
Sodium: 124 mmol/L — ABNORMAL LOW (ref 135–145)

## 2021-03-26 LAB — ENA+DNA/DS+ANTICH+CENTRO+JO...
Anti JO-1: <0.2 AI (ref 0.0–0.9)
Centromere Ab Screen: <0.2 AI (ref 0.0–0.9)
Chromatin Ab SerPl-aCnc: <0.2 AI (ref 0.0–0.9)
ENA SM Ab Ser-aCnc: <0.2 AI (ref 0.0–0.9)
Ribonucleic Protein: 1.1 AI — ABNORMAL HIGH (ref 0.0–0.9)
SSA (Ro) (ENA) Antibody, IgG: >8 AI — ABNORMAL HIGH (ref 0.0–0.9)
SSB (La) (ENA) Antibody, IgG: 7.8 AI — ABNORMAL HIGH (ref 0.0–0.9)
Scleroderma (Scl-70) (ENA) Antibody, IgG: 0.3 AI (ref 0.0–0.9)
ds DNA Ab: 1 IU/mL (ref 0–9)

## 2021-03-26 LAB — ANA W/REFLEX IF POSITIVE: Anti Nuclear Antibody (ANA): POSITIVE — AB

## 2021-03-26 LAB — GLUCOSE, CAPILLARY: Glucose-Capillary: 123 mg/dL — ABNORMAL HIGH (ref 70–99)

## 2021-03-26 MED ORDER — POTASSIUM CHLORIDE 10 MEQ/100ML IV SOLN
10.0000 meq | INTRAVENOUS | Status: AC
  Administered 2021-03-26 (×4): 10 meq via INTRAVENOUS
  Filled 2021-03-26 (×4): qty 100

## 2021-03-26 MED ORDER — CEFAZOLIN SODIUM-DEXTROSE 2-4 GM/100ML-% IV SOLN
2.0000 g | Freq: Two times a day (BID) | INTRAVENOUS | Status: DC
  Administered 2021-03-26 – 2021-03-27 (×2): 2 g via INTRAVENOUS
  Filled 2021-03-26 (×3): qty 100

## 2021-03-26 MED ORDER — POTASSIUM CHLORIDE 20 MEQ PO PACK
20.0000 meq | PACK | ORAL | Status: AC
  Administered 2021-03-26: 20 meq via ORAL
  Filled 2021-03-26: qty 1

## 2021-03-26 MED ORDER — LEVALBUTEROL HCL 0.63 MG/3ML IN NEBU
0.6300 mg | INHALATION_SOLUTION | Freq: Three times a day (TID) | RESPIRATORY_TRACT | Status: DC
Start: 2021-03-26 — End: 2021-03-30
  Administered 2021-03-26 – 2021-03-30 (×13): 0.63 mg via RESPIRATORY_TRACT
  Filled 2021-03-26 (×13): qty 3

## 2021-03-26 MED ORDER — SENNOSIDES-DOCUSATE SODIUM 8.6-50 MG PO TABS
1.0000 | ORAL_TABLET | Freq: Every day | ORAL | Status: DC
Start: 2021-03-26 — End: 2021-03-28
  Administered 2021-03-26 – 2021-03-28 (×3): 1 via ORAL
  Filled 2021-03-26 (×3): qty 1

## 2021-03-26 MED ORDER — POTASSIUM CHLORIDE 20 MEQ PO PACK
20.0000 meq | PACK | ORAL | Status: DC
  Administered 2021-03-26: 20 meq
  Filled 2021-03-26: qty 1

## 2021-03-26 MED ORDER — POLYETHYLENE GLYCOL 3350 17 G PO PACK
17.0000 g | PACK | Freq: Two times a day (BID) | ORAL | Status: DC
  Administered 2021-03-26 – 2021-03-29 (×6): 17 g via ORAL
  Filled 2021-03-26 (×11): qty 1

## 2021-03-26 MED ORDER — IPRATROPIUM BROMIDE 0.02 % IN SOLN
0.5000 mg | Freq: Three times a day (TID) | RESPIRATORY_TRACT | Status: DC
  Administered 2021-03-26 – 2021-03-30 (×13): 0.5 mg via RESPIRATORY_TRACT
  Filled 2021-03-26 (×13): qty 2.5

## 2021-03-26 NOTE — Progress Notes (Signed)
[facility] ADULT ICU REPLACEMENT PROTOCOL   The patient does apply for the [facility] Adult ICU Electrolyte Replacment Protocol based on the criteria listed below:   1.Exclusion criteria: TCTS patients, ECMO patients, and Dialysis patients 2. Is GFR >/= 30 ml/min? Yes.    Patient's GFR today is 34 3. Is SCr </= 2? Yes.   Patient's SCr is 1.6 mg/dL 4. Did SCr increase >/= 0.5 in 24 hours? No. 5.Pt's weight >40kg  Yes.   6. Abnormal electrolyte(s): k 3.1  7. Electrolytes replaced per protocol 8.  Call MD STAT for K+ </= 2.5, Phos </= 1, or Mag </= 1 Physician:    Ronda Fairly A 03/26/2021 6:14 AM

## 2021-03-26 NOTE — Progress Notes (Signed)
Progress Note  Patient Name: Sheila Morales Date of Encounter: 03/26/2021  [facility] Cardiologist: Elouise Munroe, MD   Subjective   More alert today.  Back in atrial fibrillation since 4 PM yesterday.  Husband at bedside.  Brother was just in room.  Inpatient Medications    Scheduled Meds:  aspirin EC  81 mg Oral Daily   Chlorhexidine Gluconate Cloth  6 each Topical Daily   folic acid  1 mg Oral Daily   ipratropium  0.5 mg Nebulization TID   levalbuterol  0.63 mg Nebulization TID   mouth rinse  15 mL Mouth Rinse BID   potassium chloride  20 mEq Oral Q4H   sodium chloride flush  10-40 mL Intracatheter Q12H   sodium chloride  1 g Oral TID WC   Continuous Infusions:  sodium chloride Stopped (03/26/21 0801)   amiodarone 30 mg/hr (03/26/21 0914)   cefTRIAXone (ROCEPHIN)  IV Stopped (03/25/21 1837)   diltiazem (CARDIZEM) infusion 5 mg/hr (03/26/21 0900)   heparin 1,600 Units/hr (03/26/21 0915)   potassium chloride 10 mEq (03/26/21 0915)   PRN Meds: sodium chloride, acetaminophen **OR** acetaminophen, adenosine (ADENOCARD) IV, albuterol, benzonatate, diphenhydrAMINE, labetalol, lip balm, morphine injection, nitroGLYCERIN, ondansetron **OR** ondansetron (ZOFRAN) IV, oxyCODONE, sodium chloride flush   Vital Signs    Vitals:   03/26/21 0900 03/26/21 0915 03/26/21 0930 03/26/21 0945  BP: 119/72 122/78 (!) 140/99 136/80  Pulse: (!) 121 (!) 128 (!) 104 (!) 119  Resp: (!) 26 (!) 24 (!) 26 20  Temp:      TempSrc:      SpO2: 100% 100% 100% 100%  Weight:      Height:        Intake/Output Summary (Last 24 hours) at 03/26/2021 0957 Last data filed at 03/26/2021 0919 Gross per 24 hour  Intake 1580.75 ml  Output 1775 ml  Net -194.25 ml   Last 3 Weights 03/25/2021 03/24/2021 03/23/2021  Weight (lbs) 128 lb 8.5 oz 125 lb 10.6 oz 129 lb 3 oz  Weight (kg) 58.3 kg 57 kg 58.6 kg      Telemetry    Yesterday at 4 PM went back into atrial fibrillation heart rates in the 110s to  120s- Personally Reviewed  Physical Exam   GEN: Moderately ill-appearing Neck: No JVD Cardiac: Irregularly irregular tachycardic, no murmurs, rubs, or gallops.  Respiratory: Mildly coarse bilaterally GI: Soft, nontender, non-distended  MS: No edema; No deformity. Neuro:  Nonfocal  Psych: Normal affect   Labs    High Sensitivity Troponin:  No results for input(s): TROPONINIHS in the last 720 hours.   Chemistry Recent Labs  Lab 03/23/21 0039 03/24/21 0011 03/24/21 0907 03/25/21 0123 03/26/21 0522  NA 122* 122* 125* 123* 124*  K 3.7 3.3* 4.2 4.0 3.1*  CL 89* 90* 91* 88* 89*  CO2 20* 21* 20* 20* 23  GLUCOSE 96 152* 124* 309* 243*  BUN 35* 28* 28* 29* 40*  CREATININE 2.03* 1.60* 1.75* 1.55* 1.60*  CALCIUM 8.3* 8.2* 8.6* 7.9* 8.2*  MG 1.8 2.2 2.1  --   --   PROT 6.4* 7.0 6.6  --   --   ALBUMIN 2.7* 3.4* 3.6 2.9*  --   AST 31 27 26   --   --   ALT 23 20 18   --   --   ALKPHOS 118 90 84  --   --   BILITOT 0.5 0.3 0.6  --   --   GFRNONAA 26* 34* 31*  36* 34*  ANIONGAP 13 11 14 15 12     Lipids No results for input(s): CHOL, TRIG, HDL, LABVLDL, LDLCALC, CHOLHDL in the last 168 hours.  Hematology Recent Labs  Lab 03/24/21 0907 03/25/21 0123 03/26/21 0522  WBC 15.1* 16.9* 19.7*  RBC 3.37* 3.32* 3.37*  HGB 10.7* 10.8* 10.8*  HCT 29.6* 29.3* 29.7*  MCV 87.8 88.3 88.1  MCH 31.8 32.5 32.0  MCHC 36.1* 36.9* 36.4*  RDW 13.7 14.1 14.6  PLT 168 162 221   Thyroid  Recent Labs  Lab 03/22/21 0648  TSH 0.909    BNP Recent Labs  Lab 03/21/21 1206 03/21/21 1915 03/22/21 0648 03/24/21 0011  BNP  --  1,399.1* 1,498.1* 2,707.6*  PROBNP 1,279.0*  --   --   --     DDimer No results for input(s): DDIMER in the last 168 hours.   Radiology    DG Chest Port 1 View  Result Date: 03/25/2021 CLINICAL DATA:  Respiratory distress. EXAM: PORTABLE CHEST 1 VIEW COMPARISON:  Chest x-rays dated 03/24/2021 and 03/23/2021. FINDINGS: 0354 hours. Patchy bilateral airspace opacities are  stable, again most confluent within the RIGHT upper lung. No pleural effusion or pneumothorax is seen. Heart size and mediastinal contours are stable. Pacer pad overlies the LEFT heart border. LEFT chest wall pacemaker/ICD apparatus appears stable in position. IMPRESSION: Stable chest x-ray. Patchy bilateral airspace opacities are stable, again most confluent within the RIGHT upper lung, compatible with multifocal pneumonia and/or pulmonary edema. Electronically Signed   By: Franki Cabot M.D.   On: 03/25/2021 07:59   DG CHEST PORT 1 VIEW  Result Date: 03/24/2021 CLINICAL DATA:  Shortness of breath EXAM: PORTABLE CHEST 1 VIEW COMPARISON:  03/23/2021 FINDINGS: Persistent bilateral pulmonary opacities. Similar lung aeration. No significant pleural effusion. No pneumothorax. Stable mild cardiomegaly. IMPRESSION: Persistent bilateral pulmonary opacities reflecting pulmonary edema or multifocal pneumonia. Electronically Signed   By: Macy Mis M.D.   On: 03/24/2021 11:07    Cardiac Studies   Echocardiogram 03/22/2021:   1. Left ventricular ejection fraction, by estimation, is 70 to 75%. The  left ventricle has hyperdynamic function. The left ventricle has no  regional wall motion abnormalities. There is mild left ventricular  hypertrophy. Left ventricular diastolic  parameters are consistent with Grade II diastolic dysfunction  (pseudonormalization). Elevated left atrial pressure.   2. Right ventricular systolic function is normal. The right ventricular  size is normal. There is mildly elevated pulmonary artery systolic  pressure.   3. Left atrial size was mildly dilated.   4. The mitral valve is normal in structure. Mild mitral valve  regurgitation. No evidence of mitral stenosis.   5. The aortic valve is tricuspid. Aortic valve regurgitation is not  visualized. Aortic valve sclerosis/calcification is present, without any  evidence of aortic stenosis.   6. The inferior vena cava is normal in  size with greater than 50%  respiratory variability, suggesting right atrial pressure of 3 mmHg.   Comparison(s): No significant change from prior study.   Patient Profile     70 y.o. female with paroxysmal atrial fibrillation with rapid ventricular response, pacemaker, chronic diastolic heart failure, COPD  Assessment & Plan    Paroxysmal atrial fibrillation, newly discovered during this hospitalization - Once again in rapid ventricular response since about 4 PM on 03/25/2021, yesterday.  Continue with amiodarone IV as well as diltiazem IV, dose as blood pressure allows. -Hopefully as her respiratory status continues to improve, her atrial fibrillation will improve as well.  She did show periods of sinus rhythm during this hospitalization.  Chronic diastolic heart failure - EF is normal/hyperdynamic 75%  Chronic anticoagulation -Currently on IV heparin.  Convert to [place] when able.  Eliquis for instance.  Hyponatremia - Per primary team.  Sodium level 122.  She is more clear today.  Her husband Sheila Morales is happy about this.  He is also concerned that if she did take a turn for the worse that he would want to be able to be here and contact his daughter in [place].  CRITICAL CARE Performed by: Candee Furbish   Total critical care time: 35 minutes  Critical care time was exclusive of separately billable procedures and treating other patients.  Critical care was necessary to treat or prevent imminent or life-threatening deterioration.  Critical care was time spent personally by me on the following activities: development of treatment plan with patient and/or surrogate as well as nursing, discussions with consultants, evaluation of patient's response to treatment, examination of patient, obtaining history from patient or surrogate, ordering and performing treatments and interventions, ordering and review of laboratory studies, ordering and review of radiographic studies, pulse oximetry and  re-evaluation of patient's condition.   For questions or updates, please contact [place] Please consult [email or web address] for contact info under        Signed, Candee Furbish, MD  03/26/2021, 9:57 AM

## 2021-03-26 NOTE — Progress Notes (Signed)
[facility] for Infectious Disease  Date of Admission:  03/21/2021   Total days of inpatient antibiotics 6  Principal Problem:   Acute CHF (congestive heart failure) (HCC) Active Problems:   Tobacco use   COPD (chronic obstructive pulmonary disease) (HCC)   Hyponatremia   AKI (acute kidney injury) ([place])   Hypertension   Pulmonary vascular congestion   Acute metabolic encephalopathy      Assessment: 70 YF with sinus node dysfunction SP PPM on 05/26/20, COPD, SLE on methotrexate admitted for hyponatremia found to have Ecoli bacteremia.    # Ecoli bacteremia suspected 2/2 suspsected UTI in an immunocompromised patient #Persistent leukocytosis #Acute respiratory failure 2/2 suspected pulmonary edema on HFNC -On review of urgent care note on 11/28 pt had presented with complaint of lower abdominal pain, constipation, fatigue for about 2-3 days prior. Although urine Cx show no growth it was obtained a day after starting broad spectrum antibiotics. As such suspect bacteremia 2/2 UTI. I am less concerned about PPM infection with gram negative bacteremia, with no indication of infection on exam and negative TTE.   -Repeat blood Cx have been negative -Would like to obtain CT chest abdomen pelvis in the setting of persistent leukocytosis. -Per hyponatremia nephrology consulted and suspected acute on chronic hyponatremia, possible SIADH  Recommendations:  -D/C ceftriaxone and start cefazolin -CT chest abdomen pelvis, if negative then may consider TEE given new onset afib -Follow blood Cx form 11/30  Microbiology:   Antibiotics: Cefepime 11/30 Ceftriaxone 12/1-p Metronidazole 11/30 Vancomycin 11/30-12/1 Cultures: Blood 11/302/2 Ecoli 12/2 NGTD 12/3 NGTD Urine 12/1 NGTD   SUBJECTIVE: Pt is resting in bed. She denies any urinary symptoms. She does not recall her symptoms prior to hospitalization.   Review of Systems: Review of Systems  All other systems reviewed  and are negative.   Scheduled Meds:  aspirin EC  81 mg Oral Daily   Chlorhexidine Gluconate Cloth  6 each Topical Daily   folic acid  1 mg Oral Daily   ipratropium  0.5 mg Nebulization TID   levalbuterol  0.63 mg Nebulization TID   mouth rinse  15 mL Mouth Rinse BID   polyethylene glycol  17 g Oral BID   senna-docusate  1 tablet Oral Daily   sodium chloride flush  10-40 mL Intracatheter Q12H   sodium chloride  1 g Oral TID WC   Continuous Infusions:  sodium chloride Stopped (03/26/21 1100)   amiodarone 30 mg/hr (03/26/21 1200)    ceFAZolin (ANCEF) IV     diltiazem (CARDIZEM) infusion 7.5 mg/hr (03/26/21 1200)   heparin 1,600 Units/hr (03/26/21 1200)   PRN Meds:.sodium chloride, acetaminophen **OR** acetaminophen, adenosine (ADENOCARD) IV, albuterol, benzonatate, diphenhydrAMINE, labetalol, lip balm, morphine injection, nitroGLYCERIN, ondansetron **OR** ondansetron (ZOFRAN) IV, oxyCODONE, sodium chloride flush No Known Allergies  OBJECTIVE: Vitals:   03/26/21 0915 03/26/21 0930 03/26/21 0945 03/26/21 1121  BP: 122/78 (!) 140/99 136/80   Pulse: (!) 128 (!) 104 (!) 119   Resp: (!) 24 (!) 26 20   Temp:    98.3 F (36.8 C)  TempSrc:    Oral  SpO2: 100% 100% 100%   Weight:      Height:       Body mass index is 23.51 kg/m.  Physical Exam Constitutional:      Appearance: Normal appearance.  HENT:     Head: Normocephalic and atraumatic.     Right Ear: Tympanic membrane normal.     Left Ear:  Tympanic membrane normal.     Nose: Nose normal.     Mouth/Throat:     Mouth: Mucous membranes are moist.  Eyes:     Extraocular Movements: Extraocular movements intact.     Conjunctiva/sclera: Conjunctivae normal.     Pupils: Pupils are equal, round, and reactive to light.  Cardiovascular:     Rate and Rhythm: Normal rate and regular rhythm.     Heart sounds: No murmur heard.   No friction rub. No gallop.  Pulmonary:     Effort: Pulmonary effort is normal.     Breath sounds:  Normal breath sounds.  Abdominal:     General: Abdomen is flat.     Palpations: Abdomen is soft.  Musculoskeletal:        General: Normal range of motion.  Skin:    General: Skin is warm and dry.  Neurological:     General: No focal deficit present.     Mental Status: She is alert and oriented to person, place, and time.  Psychiatric:        Mood and Affect: Mood normal.      Lab Results Lab Results  Component Value Date   WBC 19.7 (H) 03/26/2021   HGB 10.8 (L) 03/26/2021   HCT 29.7 (L) 03/26/2021   MCV 88.1 03/26/2021   PLT 221 03/26/2021    Lab Results  Component Value Date   CREATININE 1.60 (H) 03/26/2021   BUN 40 (H) 03/26/2021   NA 124 (L) 03/26/2021   K 3.1 (L) 03/26/2021   CL 89 (L) 03/26/2021   CO2 23 03/26/2021    Lab Results  Component Value Date   ALT 18 03/24/2021   AST 26 03/24/2021   ALKPHOS 84 03/24/2021   BILITOT 0.6 03/24/2021        Laurice Record, [facility] 03/26/2021, 12:06 PM

## 2021-03-26 NOTE — Progress Notes (Signed)
Speech Language Pathology Treatment: Dysphagia  Patient Details Name: Sheila Morales MRN: [id number] DOB: [birth year] Today's Date: 03/26/2021 Time: 3383-2919 SLP Time Calculation (min) (ACUTE ONLY): 14 min  Assessment / Plan / Recommendation Clinical Impression  Pt is much more alert than she was during her initial evaluation, although still drowsy and requiring cues to sustain attention to self-feeding. Despite her current mentation, she consumed regular solids and thin liquids with functional appearing swallowing function with no overt s/s of aspiration. Aspiration precautions were reviewed with pt and husband, with emphasis on their importance while she is acutely ill. Would continue with regular solids and thin liquids though with no acute SLP f/u indicated at this time. They are in agreement. Please reorder with any acute changes.   HPI HPI: Pt is a 70 yo female admitted with AMS and acute respiratory failure in the setting of CHF with concern for PNA, although CT Chest was negative for acute airspace disease or PNA. PMH includes: COPD, IBS, RLS, lupus, CHF, SSS s/p PPM      SLP Plan  All goals met      Recommendations for follow up therapy are one component of a multi-disciplinary discharge planning process, led by the attending physician.  Recommendations may be updated based on patient status, additional functional criteria and insurance authorization.    Recommendations  Diet recommendations: Regular;Thin liquid Liquids provided via: Cup;Straw Medication Administration: Whole meds with liquid Supervision: Patient able to self feed;Intermittent supervision to cue for compensatory strategies Compensations: Minimize environmental distractions;Slow rate;Small sips/bites Postural Changes and/or Swallow Maneuvers: Seated upright 90 degrees                Oral Care Recommendations: Oral care BID Follow Up Recommendations: No SLP follow up Assistance recommended at discharge:  None SLP Visit Diagnosis: Dysphagia, unspecified (R13.10) Plan: All goals met       GO                Osie Bond., M.A. [facility] Pager [phone] Office [phone]  03/26/2021, 2:59 PM

## 2021-03-26 NOTE — Progress Notes (Signed)
ANTICOAGULATION CONSULT NOTE  Pharmacy Consult for heparin Indication: atrial fibrillation  No Known Allergies  Patient Measurements: Height: 5\' 2"  (157.5 cm) Weight: 58.3 kg (128 lb 8.5 oz) IBW/kg (Calculated) : 50.1 Heparin Dosing Weight: 60kg  Vital Signs: Temp: 98.4 F (36.9 C) (12/05 0727) Temp Source: Oral (12/05 0727) BP: 125/68 (12/05 0800) Pulse Rate: 128 (12/05 0800)  Labs: Recent Labs    03/24/21 0907 03/24/21 1523 03/25/21 0123 03/25/21 1130 03/25/21 2105 03/26/21 0522  HGB 10.7*  --  10.8*  --   --  10.8*  HCT 29.6*  --  29.3*  --   --  29.7*  PLT 168  --  162  --   --  221  HEPARINUNFRC  --    < > 0.24* 0.32 0.23* 0.31  CREATININE 1.75*  --  1.55*  --   --  1.60*   < > = values in this interval not displayed.     Estimated Creatinine Clearance: 25.5 mL/min (A) (by C-G formula based on SCr of 1.6 mg/dL (H)).   Assessment: 85 YOF admitted with AMS.  Pharmacy consulted to manage IV heparin for new onset Afib RVR.  Heparin level therapeutic and low normal at 0.31 units/mL.  No bleeding reported.  Goal of Therapy:  Heparin level 0.3-0.7 units/ml Monitor platelets by anticoagulation protocol: Yes   Plan:  Increase IV heparin slightly to 1600 units/hr Daily heparin level and CBC  Flornce Record D. Mina Marble, PharmD, BCPS, [place] 03/26/2021, 9:07 AM

## 2021-03-26 NOTE — Progress Notes (Signed)
RT in room to administer breathing tx.  Pt having significant increased WOB on 4LPM. X/A tx given to pt and pt stated she ' Was going home.'  Rt asked how she was leaving and she stated she didn't know but she ' needed to get out of here.'  RT explained she was working hard to breathe and she was on 4LPM [place] and asked if she had O2 at home which she stated 'no.'  Rt explained she would need it to go home and pt laughed strangely.  RT spoke to her about using bipap for the night but again stated she was leaving and not wearing anything.  RT finished tx and left pt on 4LPM at this time.  Rt will continue to monitor.

## 2021-03-26 NOTE — Progress Notes (Signed)
[facility] NEPHROLOGY PROGRESS NOTE  Assessment/ Plan: Pt is a 70 y.o. yo female   with history of COPD, IBS, HLD, ischemic colitis, admitted on 12/1 for pneumonia, E. coli bacteremia and sepsis.  We are consulted for acute on chronic hyponatremia and AKI.  Initially, the patient was thought to have acute on chronic heart failure therefore treated with IV Lasix.  The the creatinine level and sodium level worsened therefore it was discontinued.   # Acute kidney injury, nonoliguric likely hemodynamically mediated in the setting of hypotension: ARB, diuretics and sepsis. UA and kidney ultrasound unremarkable.  ARB and Lasix held.  BL Cr ~0.8-0.9 -Cr stable today at 1.6, remains nonoliguric. No sign of peripheral edema therefore recommend to continue holding diuretics for today.  Continue strict ins and out and daily lab.   #Acute on chronic hyponatremia: Urinary studies pointing towards SIADH. TSH within normal range. Continue fluid restriction and added salt tablet on 12/3.  Monitor lab. Na stable/slightly improved today, up to 482  #Acute metabolic encephalopathy probably in the setting of pneumonia and infection.  No focal deficit.  Continue to monitor.  #Hypokalemia: replete prn  #E. coli bacteremia suspected due to UTI: Seen by ID repeat culture on 11/30, continue ceftriaxone.  #A. fib with RVR: Seen by cardiologist.  Currently on amiodarone drip, diltiazem and heparin drip.   Subjective: Seen and examined at bedside.  On amio and cardizem gtt. No acute events, no new complaints. Uop ~1.3L.Discussed with husband at bedside. Objective Vital signs in last 24 hours: Vitals:   03/26/21 0400 03/26/21 0500 03/26/21 0600 03/26/21 0727  BP: 129/88 113/70 132/85   Pulse: (!) 118 (!) 115 (!) 114   Resp: 17 16 20    Temp: 98.2 F (36.8 C)   98.4 F (36.9 C)  TempSrc: Oral   Oral  SpO2: 100% 100% 100%   Weight:      Height:       Weight change:   Intake/Output Summary (Last  24 hours) at 03/26/2021 0756 Last data filed at 03/26/2021 0400 Gross per 24 hour  Intake 1094.88 ml  Output 1325 ml  Net -230.12 ml       Labs: Basic Metabolic Panel: Recent Labs  Lab 03/24/21 0011 03/24/21 0907 03/25/21 0123 03/26/21 0522  NA 122* 125* 123* 124*  K 3.3* 4.2 4.0 3.1*  CL 90* 91* 88* 89*  CO2 21* 20* 20* 23  GLUCOSE 152* 124* 309* 243*  BUN 28* 28* 29* 40*  CREATININE 1.60* 1.75* 1.55* 1.60*  CALCIUM 8.2* 8.6* 7.9* 8.2*  PHOS 2.9 3.2 3.0  --    Liver Function Tests: Recent Labs  Lab 03/23/21 0039 03/24/21 0011 03/24/21 0907 03/25/21 0123  AST 31 27 26   --   ALT 23 20 18   --   ALKPHOS 118 90 84  --   BILITOT 0.5 0.3 0.6  --   PROT 6.4* 7.0 6.6  --   ALBUMIN 2.7* 3.4* 3.6 2.9*   No results for input(s): LIPASE, AMYLASE in the last 168 hours. Recent Labs  Lab 03/21/21 1915  AMMONIA 11   CBC: Recent Labs  Lab 03/23/21 0039 03/24/21 0011 03/24/21 0907 03/25/21 0123 03/26/21 0522  WBC 16.1* 15.5* 15.1* 16.9* 19.7*  NEUTROABS 13.0* 12.0* 11.4*  --   --   HGB 11.9* 10.8* 10.7* 10.8* 10.8*  HCT 32.8* 29.3* 29.6* 29.3* 29.7*  MCV 89.9 87.5 87.8 88.3 88.1  PLT 169 167 168 162 221   Cardiac Enzymes:  No results for input(s): CKTOTAL, CKMB, CKMBINDEX, TROPONINI in the last 168 hours. CBG: Recent Labs  Lab 03/24/21 1543 03/24/21 1932 03/25/21 1946 03/25/21 2311 03/26/21 0416  GLUCAP 127* 117* 145* 147* 123*    Iron Studies: No results for input(s): IRON, TIBC, TRANSFERRIN, FERRITIN in the last 72 hours. Studies/Results: DG Chest Port 1 View  Result Date: 03/25/2021 CLINICAL DATA:  Respiratory distress. EXAM: PORTABLE CHEST 1 VIEW COMPARISON:  Chest x-rays dated 03/24/2021 and 03/23/2021. FINDINGS: 0354 hours. Patchy bilateral airspace opacities are stable, again most confluent within the RIGHT upper lung. No pleural effusion or pneumothorax is seen. Heart size and mediastinal contours are stable. Pacer pad overlies the LEFT heart  border. LEFT chest wall pacemaker/ICD apparatus appears stable in position. IMPRESSION: Stable chest x-ray. Patchy bilateral airspace opacities are stable, again most confluent within the RIGHT upper lung, compatible with multifocal pneumonia and/or pulmonary edema. Electronically Signed   By: Franki Cabot M.D.   On: 03/25/2021 07:59   DG CHEST PORT 1 VIEW  Result Date: 03/24/2021 CLINICAL DATA:  Shortness of breath EXAM: PORTABLE CHEST 1 VIEW COMPARISON:  03/23/2021 FINDINGS: Persistent bilateral pulmonary opacities. Similar lung aeration. No significant pleural effusion. No pneumothorax. Stable mild cardiomegaly. IMPRESSION: Persistent bilateral pulmonary opacities reflecting pulmonary edema or multifocal pneumonia. Electronically Signed   By: Macy Mis M.D.   On: 03/24/2021 11:07    Medications: Infusions:  sodium chloride 10 mL/hr at 03/26/21 0400   amiodarone 30 mg/hr (03/26/21 0526)   cefTRIAXone (ROCEPHIN)  IV Stopped (03/25/21 1837)   diltiazem (CARDIZEM) infusion 5 mg/hr (03/26/21 0400)   heparin 1,550 Units/hr (03/26/21 0400)   potassium chloride 10 mEq (03/26/21 2505)    Scheduled Medications:  aspirin EC  81 mg Oral Daily   Chlorhexidine Gluconate Cloth  6 each Topical Daily   folic acid  1 mg Oral Daily   ipratropium  0.5 mg Nebulization TID   levalbuterol  0.63 mg Nebulization TID   mouth rinse  15 mL Mouth Rinse BID   potassium chloride  20 mEq Oral Q4H   sodium chloride flush  10-40 mL Intracatheter Q12H   sodium chloride  1 g Oral TID WC    have reviewed scheduled and prn medications.  Physical Exam: General: Sitting on bed, not in distress Heart:RRR, s1s2 nl Lungs: Some basal rhonchi, no increased work of breathing Abdomen:soft, Non-tender, non-distended Extremities:No peripheral edema Neurology: Alert awake and following commands  Sheila Morales 03/26/2021,7:56 AM  LOS: 4 days

## 2021-03-26 NOTE — Progress Notes (Addendum)
NAME:  Sheila Morales, MRN:  [id number], DOB:  09-02-1950, LOS: 4 ADMISSION DATE:  03/21/2021, CONSULTATION DATE:  03/24/2021 REFERRING MD:  Dr. Alfredia Ferguson, CHIEF COMPLAINT: A. fib RVR with AMS  History of Present Illness:  Sheila Morales is a 70 y.o. with a past medical history significant for COPD, HFpEF, systemic lupus erythematosus, and ischemic colitis who presented to the emergency department 11/30 for complaints of dyspnea, fatigue, weakness, and altered mental status.  Per husband symptoms began 4 days prior to admission with a worrisome symptom of cough and malaise.  States he attempted to seek medical treatment for patient at local ER/urgent care but t when the wait time exceeded 11 hours they decided to leave and not be seen.  Admitted to [facility] with diastolic HF exacerbation, AKI, and hyponatremia.  Found to have e.coli bacteremia.  Morning of 12/3 patent has again developed worsening mentation with A-fib RVR with recurrent low-grade fever, tachycardia, and hypertension.  PCCM consulted for further management and transfer to ICU  Pertinent  Medical History  COPD, tobacco abuse  (RB pt) HFpEF Systemic lupus erythematosus on methotrexate  Ischemic colitis Sinus node dysfunction s/p biotronik dual chamber PPM 05/2020  Significant Hospital Events: Including procedures, antibiotic start and stop dates in addition to other pertinent events   11/30 admitted for generalized fatigue, weakness, and altered mental status found to be bacteremic with E. Coli and UTI.  Patient was also with several metabolic derangements including severely hyponatremic with a sodium of 117 12/1 Nephrology and Cardiology consulted for AKI/ hyponatremia and and HF exacerbation/ AF w/ RVR 12/2 ID consult 12/3 transfer to ICU for acute encephalopathy and AF/RVR 12/4 Came off BiPAP this morning. Napping. Still lethargic. No abdominal pain  11/30 SARS/ flu > neg 11/30 Bcx2 > pansensitive e.coli 12/1 MRSA PCR > neg 12/1 UC  >  neg 12/2 Bcx 2 > ngtd 12/3 Bcx 2 >   11/30 cefepime 11/30 flagyl 11/30 vanc > 12/1 12/1 ceftriaxone >  Interim History / Subjective:  No complaints, husband at bedside BiPAP overnight, now on 4L [place] No BM since 11/30 Afebrile WBC 16.9 > 19.7 Na 125 > 123 > 124 UOP 1.3L/ 24hrs +1 unmeasured urinary occurrence  Back in afib since yesterday afternoon Remains on amio gtt 30mg /hr, cardizem 7.5mg  /hr and heparin gtt  Objective   Blood pressure 136/80, pulse (!) 119, temperature 98.4 F (36.9 C), temperature source Oral, resp. rate 20, height 5\' 2"  (1.575 m), weight 58.3 kg, SpO2 100 %.    FiO2 (%):  [50 %] 50 %   Intake/Output Summary (Last 24 hours) at 03/26/2021 1053 Last data filed at 03/26/2021 1005 Gross per 24 hour  Intake 1576.24 ml  Output 1775 ml  Net -198.76 ml   Filed Weights   03/23/21 0546 03/24/21 0329 03/25/21 0500  Weight: 58.6 kg 57 kg 58.3 kg    Examination: General:  Older female in bed in no acute distress, intermittently more tachypneic at times HEENT: MM pink/moist  Neuro:  Alert, pleasant/ jovial but confused, oriented to self only.  MAE CV: afib with RVR/ occ PVC PULM:  intermittently tachypneic and labored at times, slight rales in RUL and bibasilar but otherwise no wheeze/ rhonchi GI: soft, mildly diffusely tender, hyper BS, purwick Extremities: warm/dry, no LE edema  Skin: no rashes   Labs noted for K 3.1, Na 123 > 124, Cl 88 > 89, sCr 1.75 > 1.55 > 1.6, WBC 16.9 > 19.7, Hgb stable 10.8  Resolved Hospital Problem list     Assessment & Plan:  Acute Hypoxic and hypercapnic Respiratory Failure  - Felt secondary to possible CAP versus pulmonary edema -Chest CT on admission negative for signs of acute pneumonia revealed mild emphysema with questionable mild degree of fibrosis and cardiomegaly, hx of SLE Hx of COPD with daily tobacco use  P: - continue BiPAP prn increased WOB - wean supplemental O2 as tolerated  - continue with xopenex/  atrovent q6hr with prn albuterol - ceftriaxone as below - early ILD changes on CT w/ hx of SLE > may need further workup and ?steroids, will discuss with Dr. Ruthann Cancer  - monitor volume status closely, hold on diuresis today  - pending urine legionella - repeat imaging as below today   - Husband stated previously patient would want to be intubated if needed but patient would not want prolonged aggressive measures  E. Coli bacteremia  -Felt secondary to UTI in immunocompromised patient although UC neg (but was after abx) P: - ID following, appreciate assistance - Continue to follow repeat blood cultures, negative to date and neg TTE 12/1, may need to consider TEE  - ID changing ceftriaxone to cefazolin today  - persistent leukocytosis, with abd tenderness and constipation, going for CT chest/ abd/ pelvis per ID   Acute on chronic diastolic heart failure -ECHO 12/1 with EF 70-75 and grade II diastolic dysfunction  Hx of HTN New onset A. fib RVR -Felt secondary to sepsis History of sick sinus syndrome s/p permanent pacemaker P: - Cardiology following, appreciate assistance - amio and cardizem gtt per cards - heparin gtt per pharmacy, will need changing to [place] prior to d/c - goal K > 4, Mag > 2 - strict I/Os/ close monitoring of fluid status, holding on diuresis today   Acute metabolic encephalopathy P: Delirium, aspiration precautions Minimize sedating medications  Acute Kidney Injury, non-oliguric -baseline Creatinine 0.70- 0.9 - suspected due to hypotension, diuretics, sepsis, ARBs Acute on chronic hyponatremia  P: - Nephrology following, appreciate input, suspected SIADH, 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  Husband states she struggles between diarrhea and constipation for years but recently started herbal supplement and has done well   Peripheral Neuropathy  - on home gabapentin ?900mg  in am and 1200mg  at HS, last dose 12/1 - will restart low dose, and avoid morphine/ oxy IR.  Monitor for possible gabapentin withdrawal- some of her symptoms could be related but seem to have started prior to her being admitted while still on gabapentin.   Best Practice (right click and "Reselect all SmartList Selections" daily)   Diet/type: Regular consistency (see orders) DVT prophylaxis: systemic heparin GI prophylaxis: PPI Lines: N/A Foley:  N/A Code Status:  full code Last date of multidisciplinary goals of care discussion: Husband updated at bedside     Critical care time:   CRITICAL CARE Performed by: Kennieth Rad  Total critical care time:  40 minutes  Critical care time was exclusive of separately billable procedures and treating other patients.  Critical  care was necessary to treat or prevent imminent or life-threatening deterioration.  Critical care was time spent personally by me on the following activities: development of treatment plan with patient and/or surrogate as well as nursing, discussions with consultants, evaluation of patient's response to treatment, examination of patient, obtaining history from patient or surrogate, ordering and performing treatments and interventions, ordering and review of laboratory studies, ordering and review of radiographic studies, pulse oximetry and re-evaluation of patient's condition.     Kennieth Rad, ACNP [facility] 03/26/2021, 10:53 AM  See Amion for pager If no response to pager, please call PCCM consult  pager After 7:00 pm call Elink

## 2021-03-27 DIAGNOSIS — I509 Heart failure, unspecified: Secondary | ICD-10-CM | POA: Diagnosis not present

## 2021-03-27 DIAGNOSIS — I5031 Acute diastolic (congestive) heart failure: Secondary | ICD-10-CM | POA: Diagnosis not present

## 2021-03-27 LAB — RESPIRATORY PANEL BY PCR

## 2021-03-27 LAB — BASIC METABOLIC PANEL
Anion gap: 12 (ref 5–15)
BUN: 34 mg/dL — ABNORMAL HIGH (ref 8–23)
CO2: 19 mmol/L — ABNORMAL LOW (ref 22–32)
Calcium: 8.9 mg/dL (ref 8.9–10.3)
Chloride: 95 mmol/L — ABNORMAL LOW (ref 98–111)
Creatinine, Ser: 1.3 mg/dL — ABNORMAL HIGH (ref 0.44–1.00)
GFR, Estimated: 44 mL/min — ABNORMAL LOW (ref >60)
Glucose, Bld: 125 mg/dL — ABNORMAL HIGH (ref 70–99)
Potassium: 4.7 mmol/L (ref 3.5–5.1)
Sodium: 126 mmol/L — ABNORMAL LOW (ref 135–145)

## 2021-03-27 LAB — COMPREHENSIVE METABOLIC PANEL
ALT: 34 U/L (ref 0–44)
AST: 48 U/L — ABNORMAL HIGH (ref 15–41)
Albumin: 2.5 g/dL — ABNORMAL LOW (ref 3.5–5.0)
Alkaline Phosphatase: 74 U/L (ref 38–126)
Anion gap: 12 (ref 5–15)
BUN: 29 mg/dL — ABNORMAL HIGH (ref 8–23)
CO2: 20 mmol/L — ABNORMAL LOW (ref 22–32)
Calcium: 8.3 mg/dL — ABNORMAL LOW (ref 8.9–10.3)
Chloride: 89 mmol/L — ABNORMAL LOW (ref 98–111)
Creatinine, Ser: 1.19 mg/dL — ABNORMAL HIGH (ref 0.44–1.00)
GFR, Estimated: 49 mL/min — ABNORMAL LOW (ref >60)
Glucose, Bld: 369 mg/dL — ABNORMAL HIGH (ref 70–99)
Potassium: 4 mmol/L (ref 3.5–5.1)
Sodium: 121 mmol/L — ABNORMAL LOW (ref 135–145)
Total Bilirubin: 0.6 mg/dL (ref 0.3–1.2)
Total Protein: 5.8 g/dL — ABNORMAL LOW (ref 6.5–8.1)

## 2021-03-27 LAB — CBC
HCT: 32.9 % — ABNORMAL LOW (ref 36.0–46.0)
Hemoglobin: 11.6 g/dL — ABNORMAL LOW (ref 12.0–15.0)
MCH: 31 pg (ref 26.0–34.0)
MCHC: 35.3 g/dL (ref 30.0–36.0)
MCV: 88 fL (ref 80.0–100.0)
Platelets: 271 10*3/uL (ref 150–400)
RBC: 3.74 MIL/uL — ABNORMAL LOW (ref 3.87–5.11)
RDW: 14.6 % (ref 11.5–15.5)
WBC: 22.2 10*3/uL — ABNORMAL HIGH (ref 4.0–10.5)
nRBC: 0 % (ref 0.0–0.2)

## 2021-03-27 LAB — HEPARIN LEVEL (UNFRACTIONATED): Heparin Unfractionated: 0.29 IU/mL — ABNORMAL LOW (ref 0.30–0.70)

## 2021-03-27 LAB — GLUCOSE, CAPILLARY: Glucose-Capillary: 106 mg/dL — ABNORMAL HIGH (ref 70–99)

## 2021-03-27 MED ORDER — GABAPENTIN 300 MG PO CAPS
300.0000 mg | ORAL_CAPSULE | Freq: Three times a day (TID) | ORAL | Status: DC
  Administered 2021-03-27 – 2021-04-01 (×16): 300 mg via ORAL
  Filled 2021-03-27 (×16): qty 1

## 2021-03-27 MED ORDER — NICOTINE 7 MG/24HR TD PT24
7.0000 mg | MEDICATED_PATCH | Freq: Every day | TRANSDERMAL | Status: AC
  Administered 2021-03-27 – 2021-03-30 (×4): 7 mg via TRANSDERMAL
  Filled 2021-03-27 (×4): qty 1

## 2021-03-27 MED ORDER — CEFAZOLIN SODIUM-DEXTROSE 2-4 GM/100ML-% IV SOLN
2.0000 g | Freq: Three times a day (TID) | INTRAVENOUS | Status: AC
  Administered 2021-03-27 – 2021-03-30 (×11): 2 g via INTRAVENOUS
  Filled 2021-03-27 (×11): qty 100

## 2021-03-27 MED ORDER — PREDNISONE 20 MG PO TABS
40.0000 mg | ORAL_TABLET | Freq: Every day | ORAL | Status: DC
  Administered 2021-03-27 – 2021-04-01 (×6): 40 mg via ORAL
  Filled 2021-03-27 (×6): qty 2

## 2021-03-27 MED ORDER — SODIUM CHLORIDE 1 G PO TABS
1.0000 g | ORAL_TABLET | ORAL | Status: DC
  Administered 2021-03-27 – 2021-04-01 (×27): 1 g via ORAL
  Filled 2021-03-27 (×33): qty 1

## 2021-03-27 MED ORDER — FUROSEMIDE 10 MG/ML IJ SOLN
20.0000 mg | Freq: Once | INTRAMUSCULAR | Status: AC
  Administered 2021-03-27: 20 mg via INTRAVENOUS
  Filled 2021-03-27: qty 2

## 2021-03-27 NOTE — TOC Initial Note (Signed)
Transition of Care [facility]) - Initial/Assessment Note    Patient Details  Name: Sheila Morales MRN: [id number] Date of Birth: 10/11/1950  [facility] CM/SW Contact:    Tom-Johnson, Renea Ee, RN Phone Number: 03/27/2021, 2:33 PM  Clinical Narrative:                 CM spoke with patient and husband is at bedside. Patient gave CM permission to speak with spouse, Ansley Mangiapane. Daryl states that patient was independent with care and able to drive prior to hospitalization. Patient has two children, one in residing in [place]. Patient is retired from [employer]. Does not have DME's and is not on home oxygen. PCP is Pricilla Holm, MD and [facility] [place]. PT/OT to eval for disposition. Medical workup continues. CM will continue to follow with needs.     Barriers to Discharge: Continued Medical Work up   Patient Goals and CMS Choice Patient states their goals for this hospitalization and ongoing recovery are:: To go home CMS Medicare.gov Compare Post Acute Care list provided to:: Patient    Expected Discharge Plan and Services     Discharge Planning Services: CM Consult   Living arrangements for the past 2 months: Single Family Home                                      Prior Living Arrangements/Services Living arrangements for the past 2 months: Single Family Home Lives with:: Spouse Patient language and need for interpreter reviewed:: Yes Do you feel safe going back to the place where you live?: Yes      Need for Family Participation in Patient Care: Yes (Comment) Care giver support system in place?: Yes (comment)   Criminal Activity/Legal Involvement Pertinent to Current Situation/Hospitalization: No - Comment as needed  Activities of Daily Living Home Assistive Devices/Equipment: None ADL Screening (condition at time of admission) Patient's cognitive ability adequate to safely complete daily activities?: Yes Is the patient deaf or have  difficulty hearing?: No Does the patient have difficulty seeing, even when wearing glasses/contacts?: No Does the patient have difficulty concentrating, remembering, or making decisions?: No Patient able to express need for assistance with ADLs?: Yes Does the patient have difficulty dressing or bathing?: No Independently performs ADLs?: Yes (appropriate for developmental age) Does the patient have difficulty walking or climbing stairs?: No Weakness of Legs: None Weakness of Arms/Hands: None  Permission Sought/Granted Permission sought to share information with : Case Manager, Customer service manager, Family Supports Permission granted to share information with : Yes, Verbal Permission Granted              Emotional Assessment Appearance:: Appears stated age Attitude/Demeanor/Rapport: Engaged Affect (typically observed): Accepting, Appropriate, Calm, Hopeful Orientation: : Oriented to Self, Oriented to Place, Oriented to Situation Alcohol / Substance Use: Not Applicable Psych Involvement: No (comment)  Admission diagnosis:  Acute respiratory distress [R06.03] Hyponatremia [E87.1] Acute CHF (congestive heart failure) (HCC) [I50.9] Leukocytosis, unspecified type [D72.829] Acute on chronic congestive heart failure, unspecified heart failure type [facility]) [I50.9] Patient Active Problem List   Diagnosis Date Noted   Acute metabolic encephalopathy 95/28/4132   Pulmonary vascular congestion 03/21/2021   Acute CHF (congestive heart failure) ([place]) 03/21/2021   Pacemaker 09/07/2020   Left foot pain 06/30/2020   Sinus node dysfunction ([place]) 05/26/2020   Bradycardia 05/11/2020   Pain in right lower leg 02/18/2020  Episodic lightheadedness 02/02/2020   Hypertension 02/02/2020   LLQ pain 11/23/2019   Cyst of buttocks 10/05/2019   Methotrexate, long term, current use 07/29/2019   BPPV (benign paroxysmal positional vertigo) 12/16/2017   Hyponatremia 11/01/2017   AKI (acute kidney  injury) ([place])    Insomnia 02/24/2017   COPD (chronic obstructive pulmonary disease) ([place]) 01/22/2017   Routine general medical examination at a health care facility 02/19/2016   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

## 2021-03-27 NOTE — Progress Notes (Signed)
Heart Failure Navigator Progress Note  Assessed for Heart & Vascular TOC clinic readiness.  Patient does not meet criteria due to admission complicated d/t AF RVR and respiratory failure. Pt had hyponatremia (complicating diuresis) and AMS changes then transferred to MICU from [facility]. Also noted to have UTI with E.Coli bacteremia. PCCM, Cardiology, ID, nephrology following.     EF: 75%, G2DD, PPM present.   Navigator available for reassessment of patient or for educational resources regarding HF.    Pricilla Holm, MSN, RN Heart Failure Nurse Navigator [phone]

## 2021-03-27 NOTE — Progress Notes (Signed)
[place] for Infectious Disease  Date of Admission:  03/21/2021   Total days of inpatient antibiotics 7  Principal Problem:   Acute CHF (congestive heart failure) (HCC) Active Problems:   Tobacco use   COPD (chronic obstructive pulmonary disease) (HCC)   Hyponatremia   AKI (acute kidney injury) ([place])   Hypertension   Pulmonary vascular congestion   Acute metabolic encephalopathy      Assessment: 70 YF with sinus node dysfunction SP PPM on 05/26/20, COPD, SLE on methotrexate admitted for hyponatremia found to have Ecoli bacteremia.    # Ecoli bacteremia suspected 2/2 pyelonephritis in an immunocompromised patient -On review of urgent care note on 11/28 pt had presented with complaint of lower abdominal pain, constipation, fatigue for about 2-3 days prior.  -TTE negative for vegetation, aortic valve sclerosis/calcification -Per hyponatremia nephrology consulted and suspected acute on chronic hyponatremia, possible SIADH -CT showed perinephric stranding, upper lung lobes showed ground glass opacities Recommendations:  -Continue cefazolin to complete 10 days of antibiotics(end date 03/30/21) -Follow blood Cx form 11/30  #Persistent leukocytosis #Acute respiratory failure 2/2 suspected pulmonary edema on HFNC vs early ILD in the setting of SLE -Of note pt is on methotrexate with can be associated with ILD. Leukocytosis could be reactive in the setting of SLE vs infectious(less likely).  Recommendations: -Would get respiratory panel (COVID negative 11/30 on admission) in the setting of persistent leukocytosis and imaging findings.  -Consider TEE given newly discovered afib and PPM with persistent leukocytosis Microbiology:   Antibiotics: Cefepime 11/30 Ceftriaxone 12/1-12/04 Cefazolin 12/05-p Metronidazole 11/30 Vancomycin 11/30-12/1 Cultures: Blood 11/302/2 Ecoli 12/2 NGTD 12/3 NGTD Urine 12/1 NGTD   SUBJECTIVE: Pt is resting in bed. Much more alert  this morning.   Review of Systems: Review of Systems  All other systems reviewed and are negative.   Scheduled Meds:  aspirin EC  81 mg Oral Daily   Chlorhexidine Gluconate Cloth  6 each Topical Daily   folic acid  1 mg Oral Daily   furosemide  20 mg Intravenous Once   ipratropium  0.5 mg Nebulization TID   levalbuterol  0.63 mg Nebulization TID   mouth rinse  15 mL Mouth Rinse BID   polyethylene glycol  17 g Oral BID   senna-docusate  1 tablet Oral Daily   sodium chloride flush  10-40 mL Intracatheter Q12H   sodium chloride  1 g Oral TID WC   Continuous Infusions:  sodium chloride 10 mL/hr at 03/27/21 1000   amiodarone 30 mg/hr (03/27/21 1000)    ceFAZolin (ANCEF) IV Stopped (03/27/21 0537)   diltiazem (CARDIZEM) infusion 12.5 mg/hr (03/27/21 1000)   heparin 1,700 Units/hr (03/27/21 1000)   PRN Meds:.sodium chloride, acetaminophen **OR** acetaminophen, adenosine (ADENOCARD) IV, albuterol, benzonatate, labetalol, lip balm, morphine injection, nitroGLYCERIN, ondansetron **OR** ondansetron (ZOFRAN) IV, oxyCODONE, sodium chloride flush No Known Allergies  OBJECTIVE: Vitals:   03/27/21 0800 03/27/21 0827 03/27/21 0900 03/27/21 1000  BP: 138/82 138/82 122/67 128/67  Pulse: 99  (!) 105 (!) 105  Resp: 18  16 (!) 24  Temp:      TempSrc:      SpO2: 97%  92% 93%  Weight:      Height:       Body mass index is 23.23 kg/m.  Physical Exam Constitutional:      Appearance: Normal appearance.  HENT:     Head: Normocephalic and atraumatic.     Right Ear: Tympanic membrane normal.  Left Ear: Tympanic membrane normal.     Nose: Nose normal.     Mouth/Throat:     Mouth: Mucous membranes are moist.  Eyes:     Extraocular Movements: Extraocular movements intact.     Conjunctiva/sclera: Conjunctivae normal.     Pupils: Pupils are equal, round, and reactive to light.  Cardiovascular:     Rate and Rhythm: Normal rate and regular rhythm.     Heart sounds: No murmur heard.   No  friction rub. No gallop.  Pulmonary:     Effort: Pulmonary effort is normal.     Breath sounds: Normal breath sounds.  Abdominal:     General: Abdomen is flat.     Palpations: Abdomen is soft.  Musculoskeletal:        General: Normal range of motion.  Skin:    General: Skin is warm and dry.  Neurological:     General: No focal deficit present.     Mental Status: She is alert and oriented to person, place, and time.  Psychiatric:        Mood and Affect: Mood normal.      Lab Results Lab Results  Component Value Date   WBC 22.2 (H) 03/27/2021   HGB 11.6 (L) 03/27/2021   HCT 32.9 (L) 03/27/2021   MCV 88.0 03/27/2021   PLT 271 03/27/2021    Lab Results  Component Value Date   CREATININE 1.30 (H) 03/27/2021   BUN 34 (H) 03/27/2021   NA 126 (L) 03/27/2021   K 4.7 03/27/2021   CL 95 (L) 03/27/2021   CO2 19 (L) 03/27/2021    Lab Results  Component Value Date   ALT 18 03/24/2021   AST 26 03/24/2021   ALKPHOS 84 03/24/2021   BILITOT 0.6 03/24/2021        Laurice Record, [place] [facility] 03/27/2021, 11:20 AM

## 2021-03-27 NOTE — Progress Notes (Signed)
Progress Note  Patient Name: Sheila Morales Date of Encounter: 03/27/2021  [facility] Cardiologist: Elouise Munroe, MD   Subjective   Fairly comfortable in bed.  Minor increase respiratory effort noted.  Mildly confused.  Inpatient Medications    Scheduled Meds:  aspirin EC  81 mg Oral Daily   Chlorhexidine Gluconate Cloth  6 each Topical Daily   folic acid  1 mg Oral Daily   ipratropium  0.5 mg Nebulization TID   levalbuterol  0.63 mg Nebulization TID   mouth rinse  15 mL Mouth Rinse BID   polyethylene glycol  17 g Oral BID   senna-docusate  1 tablet Oral Daily   sodium chloride flush  10-40 mL Intracatheter Q12H   sodium chloride  1 g Oral TID WC   Continuous Infusions:  sodium chloride 10 mL/hr at 03/27/21 1000   amiodarone 30 mg/hr (03/27/21 1000)    ceFAZolin (ANCEF) IV Stopped (03/27/21 0537)   diltiazem (CARDIZEM) infusion 12.5 mg/hr (03/27/21 1000)   heparin 1,700 Units/hr (03/27/21 1000)   PRN Meds: sodium chloride, acetaminophen **OR** acetaminophen, adenosine (ADENOCARD) IV, albuterol, benzonatate, labetalol, lip balm, morphine injection, nitroGLYCERIN, ondansetron **OR** ondansetron (ZOFRAN) IV, oxyCODONE, sodium chloride flush   Vital Signs    Vitals:   03/27/21 0800 03/27/21 0827 03/27/21 0900 03/27/21 1000  BP: 138/82 138/82 122/67 128/67  Pulse: 99  (!) 105 (!) 105  Resp: 18  16 (!) 24  Temp:      TempSrc:      SpO2: 97%  92% 93%  Weight:      Height:        Intake/Output Summary (Last 24 hours) at 03/27/2021 1021 Last data filed at 03/27/2021 1000 Gross per 24 hour  Intake 1970 ml  Output 600 ml  Net 1370 ml   Last 3 Weights 03/27/2021 03/25/2021 03/24/2021  Weight (lbs) 126 lb 15.8 oz 128 lb 8.5 oz 125 lb 10.6 oz  Weight (kg) 57.6 kg 58.3 kg 57 kg      Telemetry    Atrial fibrillation with improved heart rate control, occasional pacing spikes noted.- Personally Reviewed  ECG    No new- Personally Reviewed  Physical Exam    GEN: No acute distress.  Laying in bed Neck: No JVD Cardiac: Irregularly irregular, no murmurs, rubs, or gallops.  Respiratory: Mildly coarse breath sounds bilaterally, mild increased respiratory effort noted GI: Soft, nontender, non-distended  MS: No edema; No deformity. Neuro:  Nonfocal  Psych: Normal affect   Labs    High Sensitivity Troponin:  No results for input(s): TROPONINIHS in the last 720 hours.   Chemistry Recent Labs  Lab 03/23/21 0039 03/24/21 0011 03/24/21 0907 03/25/21 0123 03/26/21 0522 03/27/21 0213  NA 122* 122* 125* 123* 124* 126*  K 3.7 3.3* 4.2 4.0 3.1* 4.7  CL 89* 90* 91* 88* 89* 95*  CO2 20* 21* 20* 20* 23 19*  GLUCOSE 96 152* 124* 309* 243* 125*  BUN 35* 28* 28* 29* 40* 34*  CREATININE 2.03* 1.60* 1.75* 1.55* 1.60* 1.30*  CALCIUM 8.3* 8.2* 8.6* 7.9* 8.2* 8.9  MG 1.8 2.2 2.1  --   --   --   PROT 6.4* 7.0 6.6  --   --   --   ALBUMIN 2.7* 3.4* 3.6 2.9*  --   --   AST 31 27 26   --   --   --   ALT 23 20 18   --   --   --  ALKPHOS 118 90 84  --   --   --   BILITOT 0.5 0.3 0.6  --   --   --   GFRNONAA 26* 34* 31* 36* 34* 44*  ANIONGAP 13 11 14 15 12 12     Lipids No results for input(s): CHOL, TRIG, HDL, LABVLDL, LDLCALC, CHOLHDL in the last 168 hours.  Hematology Recent Labs  Lab 03/25/21 0123 03/26/21 0522 03/27/21 0213  WBC 16.9* 19.7* 22.2*  RBC 3.32* 3.37* 3.74*  HGB 10.8* 10.8* 11.6*  HCT 29.3* 29.7* 32.9*  MCV 88.3 88.1 88.0  MCH 32.5 32.0 31.0  MCHC 36.9* 36.4* 35.3  RDW 14.1 14.6 14.6  PLT 162 221 271   Thyroid  Recent Labs  Lab 03/22/21 0648  TSH 0.909    BNP Recent Labs  Lab 03/21/21 1206 03/21/21 1915 03/22/21 0648 03/24/21 0011  BNP  --  1,399.1* 1,498.1* 2,707.6*  PROBNP 1,279.0*  --   --   --     DDimer No results for input(s): DDIMER in the last 168 hours.   Radiology    CT CHEST ABDOMEN PELVIS WO CONTRAST  Result Date: 03/26/2021 CLINICAL DATA:  Bacteremia, leukocytosis EXAM: CT CHEST, ABDOMEN AND  PELVIS WITHOUT CONTRAST TECHNIQUE: Multidetector CT imaging of the chest, abdomen and pelvis was performed following the standard protocol without IV contrast. COMPARISON:  CT chest 03/21/2021, CT abdomen pelvis 11/23/2019 FINDINGS: CT CHEST FINDINGS Cardiovascular: Moderate multi-vessel coronary artery calcification. Global cardiac size is mildly enlarged, unchanged from prior examination. Left subclavian pacemaker leads are seen within the right atrium and right ventricle. No pericardial effusion. Central pulmonary arteries are enlarged in keeping with changes of pulmonary arterial hypertension. Mild atherosclerotic calcification within the thoracic aorta. No aortic aneurysm. Mediastinum/Nodes: No enlarged mediastinal, hilar, or axillary lymph nodes. Thyroid gland, trachea, and esophagus demonstrate no significant findings. Lungs/Pleura: Since the prior examination, extensive asymmetric ground-glass pulmonary infiltrate has developed within the upper lobes bilaterally most in keeping with atypical infection in the acute setting. Small bilateral pleural effusions have developed, new since prior examination. No pneumothorax. No central obstructing lesion. Musculoskeletal: No acute bone abnormality. No lytic or blastic bone lesion. CT ABDOMEN PELVIS FINDINGS Hepatobiliary: No focal liver abnormality is seen. Status post cholecystectomy. No biliary dilatation. Pancreas: Unremarkable Spleen: Unremarkable Adrenals/Urinary Tract: The adrenal glands are unremarkable. The kidneys are normal in size and position. Moderate right asymmetric perinephric stranding has developed, of unclear significance. This may relate to an underlying infectious, inflammatory, or traumatic process involving the right kidney. No intrarenal or ureteral calcifications are identified. There is no hydronephrosis. The bladder is unremarkable. Stomach/Bowel: Moderate sigmoid diverticulosis. The stomach, small bowel, and large bowel are otherwise  unremarkable. No free intraperitoneal gas. Vascular/Lymphatic: Extensive aortoiliac atherosclerotic calcification. No aortic aneurysm. No pathologic adenopathy within the abdomen and pelvis. Reproductive: There is interval development of a 5.1 x 2.2 cm indeterminate cystic lesion within the left adnexa. This is not well assessed on this examination. No adnexal masses on the right. The uterus is unremarkable. Other: No abdominal wall hernia.  The rectum is unremarkable. Musculoskeletal: No acute bone abnormality. No lytic or blastic bone lesion. IMPRESSION: Extensive bilateral asymmetric upper lobe ground-glass pulmonary infiltrate, likely infectious in the acute setting. No central obstructing lesion. Moderate multi-vessel coronary artery calcification. Interval development of small bilateral pleural effusions. Interval development of moderate asymmetric right perinephric stranding. In absence of a history of trauma, this is most commonly seen in the setting of pyelonephritis. Correlation with urinalysis and urine  culture may be helpful for further management. Moderate sigmoid diverticulosis. Interval development of a 5.1 cm indeterminate cystic lesion within the left adnexa. This is not well characterized on this noncontrast examination. This would be better assessed with dedicated pelvic sonography. Aortic Atherosclerosis (ICD10-I70.0). Electronically Signed   By: Fidela Salisbury M.D.   On: 03/26/2021 19:41    Cardiac Studies   Echocardiogram 03/22/2021-EF 53% grade 2 diastolic dysfunction mildly dilated left atrium mild MR no change from prior study  Patient Profile     69 y.o. 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  Assessment & Plan    Paroxysmal atrial fibrillation - Newly discovered during this hospitalization in the setting of her respiratory failure. - Agree with IV  diltiazem titrated/increased to improve overall rate control.  On telemetry personally reviewed her heart rates are for the most part less than 100.  She does have occasional pacemaking backup noted.  Continue with IV amiodarone as well.  48 hours ago she was in a period of normal sinus rhythm.  Hopefully she will chemically convert once again.  No need for urgent cardioversion.  Chronic diastolic heart failure - Hyperdynamic/normal EF 75%.  Overall stable.  Chronic anticoagulation - When able convert to Eliquis 5 mg twice a day.  Hyponatremia - Serum sodium level has been in the 122 range.  Per primary team.  Spoke with her husband Coralyn Pear again.  Spoke with primary team.   For questions or updates, please contact [place] Please consult [email or web address] for contact info under        Signed, Candee Furbish, MD  03/27/2021, 10:21 AM

## 2021-03-27 NOTE — Progress Notes (Signed)
[facility] NEPHROLOGY PROGRESS NOTE  Assessment/ Plan: Pt is a 70 y.o. yo female   with history of COPD, IBS, HLD, ischemic colitis, admitted on 12/1 for pneumonia, E. coli bacteremia and sepsis.  We are consulted for acute on chronic hyponatremia and AKI.  Initially, the patient was thought to have acute on chronic heart failure therefore treated with IV Lasix.  The the creatinine level and sodium level worsened therefore it was discontinued.   # Acute kidney injury, nonoliguric likely hemodynamically mediated in the setting of hypotension: ARB, diuretics and sepsis. UA and kidney ultrasound unremarkable.  ARB and Lasix held.  BL Cr ~0.8-0.9 -Cr improved today at 1.3, remains nonoliguric. No sign of peripheral edema therefore recommend to continue holding diuretics for today.  Continue strict ins and out and daily lab.   #Acute on chronic hyponatremia: Urinary studies pointing towards SIADH. TSH within normal range. Continue fluid restriction and added salt tablet on 12/3.  Monitor lab. Na stable/slightly improved today, up to 161  #Acute metabolic encephalopathy probably in the setting of pneumonia and infection.  No focal deficit.  Continue to monitor.  #Hypokalemia: replete prn  #E. coli bacteremia suspected due to UTI: Seen by ID repeat culture on 11/30, on ancef  #A. fib with RVR: Seen by cardiologist.  Currently on amiodarone drip, diltiazem and heparin drip.  Will sign off from a nephrology perspective. Please call with any questions/concerns.  Subjective: Seen and examined at bedside.  Rudean Haskell some issues with confusion earlier, stated she was going home. Uop ~1L. Discussed with husband at bedside. Objective Vital signs in last 24 hours: Vitals:   03/27/21 0400 03/27/21 0500 03/27/21 0543 03/27/21 0732  BP: (!) 127/93 105/69    Pulse:  (!) 108    Resp: (!) 25 14    Temp:    97.7 F (36.5 C)  TempSrc:    Axillary  SpO2:  99%    Weight:   57.6 kg   Height:        Weight change:   Intake/Output Summary (Last 24 hours) at 03/27/2021 0802 Last data filed at 03/27/2021 0545 Gross per 24 hour  Intake 1413.49 ml  Output 1050 ml  Net 363.49 ml       Labs: Basic Metabolic Panel: Recent Labs  Lab 03/24/21 0011 03/24/21 0907 03/25/21 0123 03/26/21 0522 03/27/21 0213  NA 122* 125* 123* 124* 126*  K 3.3* 4.2 4.0 3.1* 4.7  CL 90* 91* 88* 89* 95*  CO2 21* 20* 20* 23 19*  GLUCOSE 152* 124* 309* 243* 125*  BUN 28* 28* 29* 40* 34*  CREATININE 1.60* 1.75* 1.55* 1.60* 1.30*  CALCIUM 8.2* 8.6* 7.9* 8.2* 8.9  PHOS 2.9 3.2 3.0  --   --    Liver Function Tests: Recent Labs  Lab 03/23/21 0039 03/24/21 0011 03/24/21 0907 03/25/21 0123  AST 31 27 26   --   ALT 23 20 18   --   ALKPHOS 118 90 84  --   BILITOT 0.5 0.3 0.6  --   PROT 6.4* 7.0 6.6  --   ALBUMIN 2.7* 3.4* 3.6 2.9*   No results for input(s): LIPASE, AMYLASE in the last 168 hours. Recent Labs  Lab 03/21/21 1915  AMMONIA 11   CBC: Recent Labs  Lab 03/23/21 0039 03/24/21 0011 03/24/21 0907 03/25/21 0123 03/26/21 0522 03/27/21 0213  WBC 16.1* 15.5* 15.1* 16.9* 19.7* 22.2*  NEUTROABS 13.0* 12.0* 11.4*  --   --   --   HGB  11.9* 10.8* 10.7* 10.8* 10.8* 11.6*  HCT 32.8* 29.3* 29.6* 29.3* 29.7* 32.9*  MCV 89.9 87.5 87.8 88.3 88.1 88.0  PLT 169 167 168 162 221 271   Cardiac Enzymes: No results for input(s): CKTOTAL, CKMB, CKMBINDEX, TROPONINI in the last 168 hours. CBG: Recent Labs  Lab 03/24/21 1543 03/24/21 1932 03/25/21 1946 03/25/21 2311 03/26/21 0416  GLUCAP 127* 117* 145* 147* 123*    Iron Studies: No results for input(s): IRON, TIBC, TRANSFERRIN, FERRITIN in the last 72 hours. Studies/Results: CT CHEST ABDOMEN PELVIS WO CONTRAST  Result Date: 03/26/2021 CLINICAL DATA:  Bacteremia, leukocytosis EXAM: CT CHEST, ABDOMEN AND PELVIS WITHOUT CONTRAST TECHNIQUE: Multidetector CT imaging of the chest, abdomen and pelvis was performed following the standard protocol  without IV contrast. COMPARISON:  CT chest 03/21/2021, CT abdomen pelvis 11/23/2019 FINDINGS: CT CHEST FINDINGS Cardiovascular: Moderate multi-vessel coronary artery calcification. Global cardiac size is mildly enlarged, unchanged from prior examination. Left subclavian pacemaker leads are seen within the right atrium and right ventricle. No pericardial effusion. Central pulmonary arteries are enlarged in keeping with changes of pulmonary arterial hypertension. Mild atherosclerotic calcification within the thoracic aorta. No aortic aneurysm. Mediastinum/Nodes: No enlarged mediastinal, hilar, or axillary lymph nodes. Thyroid gland, trachea, and esophagus demonstrate no significant findings. Lungs/Pleura: Since the prior examination, extensive asymmetric ground-glass pulmonary infiltrate has developed within the upper lobes bilaterally most in keeping with atypical infection in the acute setting. Small bilateral pleural effusions have developed, new since prior examination. No pneumothorax. No central obstructing lesion. Musculoskeletal: No acute bone abnormality. No lytic or blastic bone lesion. CT ABDOMEN PELVIS FINDINGS Hepatobiliary: No focal liver abnormality is seen. Status post cholecystectomy. No biliary dilatation. Pancreas: Unremarkable Spleen: Unremarkable Adrenals/Urinary Tract: The adrenal glands are unremarkable. The kidneys are normal in size and position. Moderate right asymmetric perinephric stranding has developed, of unclear significance. This may relate to an underlying infectious, inflammatory, or traumatic process involving the right kidney. No intrarenal or ureteral calcifications are identified. There is no hydronephrosis. The bladder is unremarkable. Stomach/Bowel: Moderate sigmoid diverticulosis. The stomach, small bowel, and large bowel are otherwise unremarkable. No free intraperitoneal gas. Vascular/Lymphatic: Extensive aortoiliac atherosclerotic calcification. No aortic aneurysm. No  pathologic adenopathy within the abdomen and pelvis. Reproductive: There is interval development of a 5.1 x 2.2 cm indeterminate cystic lesion within the left adnexa. This is not well assessed on this examination. No adnexal masses on the right. The uterus is unremarkable. Other: No abdominal wall hernia.  The rectum is unremarkable. Musculoskeletal: No acute bone abnormality. No lytic or blastic bone lesion. IMPRESSION: Extensive bilateral asymmetric upper lobe ground-glass pulmonary infiltrate, likely infectious in the acute setting. No central obstructing lesion. Moderate multi-vessel coronary artery calcification. Interval development of small bilateral pleural effusions. Interval development of moderate asymmetric right perinephric stranding. In absence of a history of trauma, this is most commonly seen in the setting of pyelonephritis. Correlation with urinalysis and urine culture may be helpful for further management. Moderate sigmoid diverticulosis. Interval development of a 5.1 cm indeterminate cystic lesion within the left adnexa. This is not well characterized on this noncontrast examination. This would be better assessed with dedicated pelvic sonography. Aortic Atherosclerosis (ICD10-I70.0). Electronically Signed   By: Fidela Salisbury M.D.   On: 03/26/2021 19:41    Medications: Infusions:  sodium chloride Stopped (03/26/21 1757)   amiodarone 30 mg/hr (03/26/21 2208)    ceFAZolin (ANCEF) IV 2 g (03/27/21 0507)   diltiazem (CARDIZEM) infusion 15 mg/hr (03/27/21 0228)   heparin 1,600 Units/hr (  03/27/21 0230)    Scheduled Medications:  aspirin EC  81 mg Oral Daily   Chlorhexidine Gluconate Cloth  6 each Topical Daily   folic acid  1 mg Oral Daily   ipratropium  0.5 mg Nebulization TID   levalbuterol  0.63 mg Nebulization TID   mouth rinse  15 mL Mouth Rinse BID   polyethylene glycol  17 g Oral BID   senna-docusate  1 tablet Oral Daily   sodium chloride flush  10-40 mL Intracatheter Q12H    sodium chloride  1 g Oral TID WC    have reviewed scheduled and prn medications.  Physical Exam: General: nad Heart:RRR, s1s2 nl Lungs: Some basal rhonchi, no increased work of breathing, bipap in place Abdomen:soft, Non-tender, non-distended Extremities:No peripheral edema Neurology: Alert awake and following some commands  Weylin Plagge 03/27/2021,8:02 AM  LOS: 5 days

## 2021-03-27 NOTE — Progress Notes (Signed)
Attending note: I have seen and examined the patient. History, labs and imaging reviewed.    Interval History:  12/6: remains on 4L. Still confused. Not able to lay flat 12/5: remains altered on 4L [place] at this time. Husband at bedside.    Labs reviewed, significant for Na 121 Cr 1.19 wbc 22.2 hgb 11.6   Imaging: multifocal pneumonia, personally reviewed by me.   Assessment/plan: Acute hypoxic resp failure:  -noted changes on ct chest with some mild fibrosis -pneumonia noted as well -cont steroids  -will cont bronchodilators.  -titrate oxygen, on 4L at this time -would benefit from high res ct once improved overall   Acute toxic encephalopathy:  -resume gabapentin to r/o withdrawal -add nicotine patch for same.  -minimize additional meds.    Ecoli bacteremia:  -tee when able but at [facility] stime not clinically stable for such -Id following -abx per ID.  -going for ct chest/abd/pelvis per ID    The patient is critically ill with multiple organ systems failure and requires high complexity decision making for assessment and support, frequent evaluation and titration of therapies, application of advanced monitoring technologies and extensive interpretation of multiple databases.  Critical care time 37 mins. This represents my time independent of the NP's/PA's/med student/residents time taking care of the pt. This is excluding procedures   [facility] 03/27/2021, 2:15 PM See Amion for pager If no response to pager, please call [phone] until 1900 After 1900 please call [facility] [phone]

## 2021-03-27 NOTE — Progress Notes (Signed)
Pt was taken off of BIPAP and placed on 4 L [place]. Pt is tolerating well at this time.

## 2021-03-27 NOTE — Progress Notes (Addendum)
NAME:  Sheila Morales, MRN:  [id number], DOB:  09-24-1950, LOS: 5 ADMISSION DATE:  03/21/2021, CONSULTATION DATE:  03/24/2021 REFERRING MD:  Dr. Alfredia Ferguson, CHIEF COMPLAINT: A. fib RVR with AMS  History of Present Illness:  Sheila Morales is a 70 y.o. with a past medical history significant for COPD, HFpEF, systemic lupus erythematosus, and ischemic colitis who presented to the emergency department 11/30 for complaints of dyspnea, fatigue, weakness, and altered mental status.  Per husband symptoms began 4 days prior to admission with a worrisome symptom of cough and malaise.  States he attempted to seek medical treatment for patient at local ER/urgent care but t when the wait time exceeded 11 hours they decided to leave and not be seen.  Admitted to [facility] with diastolic HF exacerbation, AKI, and hyponatremia.  Found to have e.coli bacteremia.  Morning of 12/3 patent has again developed worsening mentation with A-fib RVR with recurrent low-grade fever, tachycardia, and hypertension.  PCCM consulted for further management and transfer to ICU  Pertinent  Medical History  COPD, tobacco abuse  (RB pt) HFpEF Systemic lupus erythematosus on methotrexate  Ischemic colitis Sinus node dysfunction s/p biotronik dual chamber PPM 05/2020  Significant Hospital Events: Including procedures, antibiotic start and stop dates in addition to other pertinent events   11/30 admitted for generalized fatigue, weakness, and altered mental status found to be bacteremic with E. Coli and UTI.  Patient was also with several metabolic derangements including severely hyponatremic with a sodium of 117 12/1 Nephrology and Cardiology consulted for AKI/ hyponatremia and and HF exacerbation/ AF w/ RVR 12/2 ID consult 12/3 transfer to ICU for acute encephalopathy and AF/RVR 12/4 Came off BiPAP this morning. Napping. Still lethargic. No abdominal pain 12/6 Off BiPAP this am, some abdominal muscle use noted, on 4 L HFNC with sats of  92%  11/30 SARS/ flu > neg 11/30 Bcx2 > pansensitive e.coli 12/1 MRSA PCR > neg 12/1 UC  > neg 12/2 Bcx 2 > ngtd 12/3 Bcx 2 > ngtd  11/30 cefepime 11/30 flagyl 11/30 vanc > 12/1 12/1 ceftriaxone >  Interim History / Subjective:  No complaints, husband remains  at bedside BiPAP overnight, now on 4L HFNC No BM since 11/30 T Max 99.6 WBC 22.2>> 19.7, HGB 11.6, PLTS 271 K Na 126 , K 4.7, Cl 95, CO2 19, BUN 34, Creatinine 1.3 UOP 1.L/ 24hrs  Back in afib since 12/4 afternoon Remains on amio gtt 30mg /hr, cardizem 12.5mg  /hr and heparin gtt at 1700 units per hour Net + 3700 cc's  CT Chest / Abd/ Pelvis overnight 03/26/2021 Extensive upper lobe GG pulmonary infiltrate, suspect infectious, no centrally obstructive lesion Moderate Multi vessel Coronary artery calcification, Aortic Atherosclerosis  Interval development of small bilateral effusions .  Interval development of moderate asymmetric right perinephric stranding. In absence of a history of trauma, this is most commonly seen in the setting of pyelonephritis Moderate sigmoid diverticulosis Interval development of a 5.1 cm indeterminate cystic lesion within the left adnexa. Objective   Blood pressure 128/67, pulse (!) 105, temperature 97.7 F (36.5 C), temperature source Axillary, resp. rate (!) 24, height 5\' 2"  (1.575 m), weight 57.6 kg, SpO2 93 %.    FiO2 (%):  [50 %] 50 %   Intake/Output Summary (Last 24 hours) at 03/27/2021 1023 Last data filed at 03/27/2021 1000 Gross per 24 hour  Intake 1970 ml  Output 600 ml  Net 1370 ml   Filed Weights   03/24/21 0329 03/25/21 0500 03/27/21  0543  Weight: 57 kg 58.3 kg 57.6 kg    Examination: General:  Older female in bed in no acute distress, intermittently more tachypneic at times HEENT: MM pink/moist , No LAD Neuro:  Alert, pleasant/ jovial less confused, oriented to self husband and place.  MAE CV: afib with RVR/ occ PVC, S1, S2. Irr, No RMG PULM:  Bilateral chest  excursion, intermittently tachypneic and labored at times, rales per RUL and bibasilar but otherwise no wheeze/ rhonchi, some abdominal muscle use noted GI: soft, mildly diffusely tender, hyper BS, purwick Extremities: warm/dry, no LE edema , no obvious deformities Skin: no rashes , No lesions , warm dry and intact.     Resolved Hospital Problem list     Assessment & Plan:  Acute Hypoxic and hypercapnic Respiratory Failure  Current every day smoker - Felt secondary to possible CAP versus pulmonary edema -Chest CT on admission negative for signs of acute pneumonia revealed mild emphysema with questionable mild degree of fibrosis and cardiomegaly, hx of SLE Subsequent CT Chest 12/5>> Extensive upper lobe GG pulmonary infiltrate, suspect infectious, Hx of COPD with daily tobacco use  P: - continue BiPAP prn increased WOB - wean supplemental O2 as tolerated with sat goals of 88-92% - continue with xopenex/ atrovent q6hr with prn albuterol - ceftriaxone as below - early ILD changes on CT w/ hx of SLE > may need further workup as OP - monitor volume status closely, gentle diuresis 12/6 ( Lasix 20 mg)  -  urine legionella negative - repeat imaging as below today  - Will start Prednisone 40 mg daily ( Fibrotic changes per CT Chest) _ Will add Nicoderm patch >> 7 mg ( Per husband patient smoker about 5-6 cigarettes daily)   - Husband stated previously patient would want to be intubated if needed but patient would not want prolonged aggressive measures  E. Coli bacteremia  CT Abdomen pelvis with moderate asymmetric right perinephric Stranding, concerning for pyelonephritis ( In absence of trauma)  -Felt secondary to UTI in immunocompromised patient although UC neg (but was after abx) P: - ID following, appreciate assistance - Continue to follow repeat blood cultures, negative to date and neg TTE 12/1, may need to consider TEE  - ID changing ceftriaxone to cefazolin today  - persistent  leukocytosis, with abd tenderness and constipation, going for CT chest/ abd/ pelvis per ID  - Will defer to ID regarding further cultures , ABX treatment   Acute on chronic diastolic heart failure -ECHO 12/1 with EF 70-75 and grade II diastolic dysfunction  Hx of HTN New onset A. fib RVR -Felt secondary to sepsis History of sick sinus syndrome s/p permanent pacemaker P: - Cardiology following, appreciate assistance - amio and cardizem gtt per cards - heparin gtt per pharmacy, will need changing to [place] prior to d/c - goal K > 4, Mag > 2 - strict I/Os/ close monitoring of fluid status, gentle diuresis 95/6  Acute metabolic encephalopathy>> Improving  P: Delirium, aspiration precautions Minimize sedating medications Frequent re-orientation  Light on during the day, off at night   Acute Kidney Injury, non-oliguric - baseline Creatinine 0.70- 0.9 - suspected due to hypotension, diuretics, sepsis, ARBs  -Acute on chronic hyponatremia  P: - Nephrology following, appreciate input, suspected SIADH, 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 - adding bowel regimen today.  Husband states she struggles between diarrhea and constipation for years but recently started herbal supplement and has done well  Needs Pelvic US to better evaluate once able to tolerate lying flat  Peripheral Neuropathy  - on home gabapentin ?900mg  in am and 1200mg  at [facility], last  dose 12/1 - will restart low dose 12/6 , and avoid morphine/ oxy IR.  Monitor for possible gabapentin withdrawal- some of her symptoms could be related but seem to have started prior to her being admitted while still on gabapentin.   Best Practice (right click and "Reselect all SmartList Selections" daily)   Diet/type: Regular consistency (see orders) DVT prophylaxis: systemic heparin GI prophylaxis: PPI Lines: N/A Foley:  N/A Code Status:  full code Last date of multidisciplinary goals of care discussion: Husband updated at bedside     Critical care time:   CRITICAL CARE Performed by: Magdalen Spatz, AGACNP-BC  Total critical care time:  45 minutes  Critical care time was exclusive of separately billable procedures and treating other patients.  Critical care was necessary to treat or prevent imminent or life-threatening deterioration.  Critical care was time spent personally by me on the following activities: development of treatment plan with patient and/or surrogate as well as nursing, discussions with consultants, evaluation of patient's response to treatment, examination of patient, obtaining history from patient or surrogate, ordering and performing treatments and interventions, ordering and review of laboratory studies, ordering and review of radiographic studies, pulse oximetry and re-evaluation of patient's condition.    Magdalen Spatz, MSN, AGACNP-BC [place] for personal pager PCCM on call pager [phone]  03/27/2021, 10:23 AM

## 2021-03-27 NOTE — Progress Notes (Signed)
ANTICOAGULATION CONSULT NOTE  Pharmacy Consult for heparin Indication: atrial fibrillation  No Known Allergies  Patient Measurements: Height: 5\' 2"  (157.5 cm) Weight: 57.6 kg (126 lb 15.8 oz) IBW/kg (Calculated) : 50.1 Heparin Dosing Weight: 60kg  Vital Signs: Temp: 97.7 F (36.5 C) (12/06 0732) Temp Source: Axillary (12/06 0732) BP: 105/69 (12/06 0500) Pulse Rate: 108 (12/06 0500)  Labs: Recent Labs    03/25/21 0123 03/25/21 1130 03/25/21 2105 03/26/21 0522 03/27/21 0213  HGB 10.8*  --   --  10.8* 11.6*  HCT 29.3*  --   --  29.7* 32.9*  PLT 162  --   --  221 271  HEPARINUNFRC 0.24*   < > 0.23* 0.31 0.29*  CREATININE 1.55*  --   --  1.60* 1.30*   < > = values in this interval not displayed.     Estimated Creatinine Clearance: 31.4 mL/min (A) (by C-G formula based on SCr of 1.3 mg/dL (H)).   Assessment: 72 YOF admitted with AMS.  Pharmacy consulted to manage IV heparin for new onset Afib RVR.  Heparin level slightly sub-therapeutic at 0.29 units/mL.  No bleeding reported.  Goal of Therapy:  Heparin level 0.3-0.7 units/ml Monitor platelets by anticoagulation protocol: Yes   Plan:  Increase IV heparin to 1700 units/hr Daily heparin level and CBC  Juliano Mceachin D. Mina Marble, PharmD, BCPS, [place] 03/27/2021, 8:14 AM

## 2021-03-27 NOTE — Progress Notes (Signed)
Placed on droplet precautions at 12 noon

## 2021-03-28 ENCOUNTER — Inpatient Hospital Stay (HOSPITAL_COMMUNITY): Payer: PPO

## 2021-03-28 DIAGNOSIS — I5031 Acute diastolic (congestive) heart failure: Secondary | ICD-10-CM | POA: Diagnosis not present

## 2021-03-28 DIAGNOSIS — I509 Heart failure, unspecified: Secondary | ICD-10-CM | POA: Diagnosis not present

## 2021-03-28 DIAGNOSIS — I4819 Other persistent atrial fibrillation: Secondary | ICD-10-CM

## 2021-03-28 DIAGNOSIS — I480 Paroxysmal atrial fibrillation: Secondary | ICD-10-CM

## 2021-03-28 DIAGNOSIS — Z7901 Long term (current) use of anticoagulants: Secondary | ICD-10-CM

## 2021-03-28 DIAGNOSIS — I4891 Unspecified atrial fibrillation: Secondary | ICD-10-CM

## 2021-03-28 LAB — BRAIN NATRIURETIC PEPTIDE: B Natriuretic Peptide: 680.4 pg/mL — ABNORMAL HIGH (ref 0.0–100.0)

## 2021-03-28 LAB — CBC WITH DIFFERENTIAL/PLATELET
Abs Immature Granulocytes: 0.63 10*3/uL — ABNORMAL HIGH (ref 0.00–0.07)
Basophils Absolute: 0.1 10*3/uL (ref 0.0–0.1)
Basophils Relative: 0 %
Eosinophils Absolute: 0 10*3/uL (ref 0.0–0.5)
Eosinophils Relative: 0 %
HCT: 29.7 % — ABNORMAL LOW (ref 36.0–46.0)
Hemoglobin: 10.6 g/dL — ABNORMAL LOW (ref 12.0–15.0)
Immature Granulocytes: 3 %
Lymphocytes Relative: 4 %
Lymphs Abs: 0.9 10*3/uL (ref 0.7–4.0)
MCH: 31.4 pg (ref 26.0–34.0)
MCHC: 35.7 g/dL (ref 30.0–36.0)
MCV: 87.9 fL (ref 80.0–100.0)
Monocytes Absolute: 0.9 10*3/uL (ref 0.1–1.0)
Monocytes Relative: 4 %
Neutro Abs: 18.8 10*3/uL — ABNORMAL HIGH (ref 1.7–7.7)
Neutrophils Relative %: 89 %
Platelets: 281 10*3/uL (ref 150–400)
RBC: 3.38 MIL/uL — ABNORMAL LOW (ref 3.87–5.11)
RDW: 14.8 % (ref 11.5–15.5)
WBC: 21.2 10*3/uL — ABNORMAL HIGH (ref 4.0–10.5)
nRBC: 0 % (ref 0.0–0.2)

## 2021-03-28 LAB — BASIC METABOLIC PANEL
Anion gap: 10 (ref 5–15)
BUN: 30 mg/dL — ABNORMAL HIGH (ref 8–23)
CO2: 23 mmol/L (ref 22–32)
Calcium: 8.8 mg/dL — ABNORMAL LOW (ref 8.9–10.3)
Chloride: 94 mmol/L — ABNORMAL LOW (ref 98–111)
Creatinine, Ser: 1.12 mg/dL — ABNORMAL HIGH (ref 0.44–1.00)
GFR, Estimated: 53 mL/min — ABNORMAL LOW (ref >60)
Glucose, Bld: 154 mg/dL — ABNORMAL HIGH (ref 70–99)
Potassium: 4.3 mmol/L (ref 3.5–5.1)
Sodium: 127 mmol/L — ABNORMAL LOW (ref 135–145)

## 2021-03-28 LAB — CULTURE, BLOOD (ROUTINE X 2)
Culture: NO GROWTH
Culture: NO GROWTH
Special Requests: ADEQUATE

## 2021-03-28 LAB — HEPARIN LEVEL (UNFRACTIONATED): Heparin Unfractionated: 0.5 IU/mL (ref 0.30–0.70)

## 2021-03-28 LAB — MAGNESIUM: Magnesium: 1.8 mg/dL (ref 1.7–2.4)

## 2021-03-28 LAB — GLUCOSE, CAPILLARY: Glucose-Capillary: 146 mg/dL — ABNORMAL HIGH (ref 70–99)

## 2021-03-28 MED ORDER — SENNOSIDES-DOCUSATE SODIUM 8.6-50 MG PO TABS
1.0000 | ORAL_TABLET | Freq: Two times a day (BID) | ORAL | Status: DC
  Administered 2021-03-28 – 2021-04-01 (×7): 1 via ORAL
  Filled 2021-03-28 (×8): qty 1

## 2021-03-28 MED ORDER — APIXABAN 5 MG PO TABS
5.0000 mg | ORAL_TABLET | Freq: Two times a day (BID) | ORAL | Status: DC
  Administered 2021-03-28 – 2021-04-01 (×8): 5 mg via ORAL
  Filled 2021-03-28 (×8): qty 1

## 2021-03-28 MED ORDER — BISACODYL 10 MG RE SUPP: 10.0000 mg | Freq: Once | RECTAL | Status: DC

## 2021-03-28 MED ORDER — AMIODARONE HCL 200 MG PO TABS
200.0000 mg | ORAL_TABLET | Freq: Two times a day (BID) | ORAL | Status: DC
  Administered 2021-03-28 – 2021-04-01 (×9): 200 mg via ORAL
  Filled 2021-03-28 (×9): qty 1

## 2021-03-28 NOTE — Evaluation (Signed)
Occupational Therapy Evaluation Patient Details Name: Sheila Morales MRN: [id number] DOB: 09-15-1950 Today's Date: 03/28/2021   History of Present Illness 70 yo admitted 11/30 with hyponatremia, AMS and tachypnea after leaving ED without receiving lab work results 11/28. 12/1 pt with new AFib with RVR. 12/3 worsening AMS and placed on Bipap. PMhx: lupus, COPD, PPM, neuropathy, colitis, RLS, BPPV   Clinical Impression   Pt admitted with the above diagnoses and presents with below problem list. Pt will benefit from continued acute OT to address the below listed deficits and maximize independence with basic ADLs prior to d/c to venue below. At baseline, pt is independent with ADLs, drives, cleans house for a living. Currently pt needs min A +2 for safety with LB ADLs and functional transfers, setup assist for UB ADLs.   Pt on 4L/min O2 throughout session. Unable to get reliable SpO2 read but pt appearing in no distress throughout session. BP assessed at start of session in supine: 102/77, end of session in chair: 124/66. Spouse present throughout session.        Recommendations for follow up therapy are one component of a multi-disciplinary discharge planning process, led by the attending physician.  Recommendations may be updated based on patient status, additional functional criteria and insurance authorization.   Follow Up Recommendations  Home health OT    Assistance Recommended at Discharge Intermittent Supervision/Assistance  Functional Status Assessment  Patient has had a recent decline in their functional status and demonstrates the ability to make significant improvements in function in a reasonable and predictable amount of time.  Equipment Recommendations  Other (comment) (TBD) need to further assess need for shower seat   Recommendations for Other Services PT consult     Precautions / Restrictions Precautions Precautions: Fall Restrictions Weight Bearing Restrictions: No       Mobility Bed Mobility Overal bed mobility: Needs Assistance Bed Mobility: Supine to Sit     Supine to sit: Min guard     General bed mobility comments: min guard for safety.    Transfers Overall transfer level: Needs assistance Equipment used: 2 person hand held assist Transfers: Sit to/from Stand;Bed to chair/wheelchair/BSC Sit to Stand: Min assist;+2 safety/equipment     Step pivot transfers: Min assist;+2 safety/equipment     General transfer comment: First time up in a few days. Min A to steady, +2 utilized in lieu of rw and for line management      Balance Overall balance assessment: Needs assistance Sitting-balance support: No upper extremity supported;Feet supported Sitting balance-Leahy Scale: Fair     Standing balance support: Bilateral upper extremity supported Standing balance-Leahy Scale: Poor Standing balance comment: BUE needed in static standing. first time up in a few days.                           ADL either performed or assessed with clinical judgement   ADL Overall ADL's : Needs assistance/impaired Eating/Feeding: Set up;Sitting   Grooming: Set up;Sitting   Upper Body Bathing: Set up;Sitting   Lower Body Bathing: Minimal assistance;Sit to/from stand   Upper Body Dressing : Set up;Sitting   Lower Body Dressing: Minimal assistance;Sit to/from stand   Toilet Transfer: Minimal assistance;Stand-pivot;BSC/3in1   Toileting- Clothing Manipulation and Hygiene: Minimal assistance;Sit to/from stand;Moderate assistance       Functional mobility during ADLs: Minimal assistance General ADL Comments: Pt needed BUE support in static standing this session.     Vision  Perception     Praxis      Pertinent Vitals/Pain Pain Assessment: No/denies pain     Hand Dominance     Extremity/Trunk Assessment Upper Extremity Assessment Upper Extremity Assessment: Generalized weakness;Overall [facility] for tasks assessed   Lower  Extremity Assessment Lower Extremity Assessment: Defer to PT evaluation;Generalized weakness       Communication Communication Communication: HOH;Other (comment) (hearing aids at home. 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)   Cognition Arousal/Alertness: Awake/alert Behavior During Therapy: WFL for tasks assessed/performed Overall Cognitive Status: Within Functional Limits for tasks assessed                                       General Comments       Exercises     Shoulder Instructions      Home Living Family/patient expects to be discharged to:: Private residence Living Arrangements: Spouse/significant other Available Help at Discharge: Family Type of Home: House Home Access: Stairs to enter Technical brewer of Steps: 2 Entrance Stairs-Rails: None                     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, 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      Prior Functioning/Environment Prior Level of Function : Independent/Modified Independent;Working/employed;Driving                        OT Problem List: Decreased strength;Decreased activity tolerance;Impaired balance (sitting and/or standing);Decreased knowledge of use of DME or AE;Decreased knowledge of precautions;Cardiopulmonary status limiting activity      OT Treatment/Interventions: Self-care/ADL training;Therapeutic exercise;Energy conservation;DME and/or AE instruction;Therapeutic activities;Patient/family education;Balance training    OT Goals(Current goals can be found in the care plan section) Acute Rehab OT Goals Patient Stated Goal: home, back to daily routine OT Goal Formulation: With patient/family Time For Goal Achievement: 04/11/21 Potential to Achieve Goals: Good ADL Goals Pt Will Perform Grooming: with modified independence;standing Pt Will Perform Lower Body Dressing: with modified independence;sit to/from stand Pt Will Transfer to Toilet: with  modified independence;ambulating Pt Will Perform Toileting - Clothing Manipulation and hygiene: with modified independence;sit to/from stand Pt Will Perform Tub/Shower Transfer: with modified independence;shower seat;ambulating  OT Frequency: Min 2X/week   Barriers to D/C:            Co-evaluation              AM-PAC OT "6 Clicks" Daily Activity     Outcome Measure Help from another person eating meals?: None Help from another person taking care of personal grooming?: A Little Help from another person toileting, which includes using toliet, bedpan, or urinal?: A Little Help from another person bathing (including washing, rinsing, drying)?: A Little Help from another person to put on and taking off regular upper body clothing?: A Little Help from another person to put on and taking off regular lower body clothing?: A Little 6 Click Score: 19   End of Session Equipment Utilized During Treatment: Gait belt;Oxygen;Other (comment) (4L/min. +2 HHA in lieu of walker) Nurse Communication: Mobility status  Activity Tolerance: Patient limited by fatigue;Patient tolerated treatment well Patient left: in chair;with call bell/phone within reach;with chair alarm set;with family/visitor present  OT Visit Diagnosis: Unsteadiness on feet (R26.81);Muscle weakness (generalized) (M62.81);Pain                Time: 1157-2620 OT Time Calculation (  min): 32 min Charges:  OT General Charges $OT Visit: 1 Visit OT Evaluation $OT Eval Moderate Complexity: 1 Mod OT Treatments $Self Care/Home Management : 8-22 mins  Tyrone Schimke, OT Acute Rehabilitation Services Office: [phone]   Hortencia Pilar 03/28/2021, 10:15 AM

## 2021-03-28 NOTE — Assessment & Plan Note (Addendum)
Has been in and out of atrial fibrillation during this hospitalization.  Currently over the past 4 days she has been more persistent.  Well rate controlled.  Continue with amiodarone 400 mg p.o. twice daily at that point with taper to 200 mg p.o. twice a day for a week after that and then 200 mg a day thereafter.  Also on diltiazem IV.  Okay to increase to 15.  Hopefully tomorrow we can switch to p.o. as well.  There are no urgent needs for cardioversion at this time.  She is hemodynamically stable.  After anticoagulation straight for a total of 3 weeks, we could cardiovert her successfully as an outpatient electively if necessary without TEE.

## 2021-03-28 NOTE — Progress Notes (Signed)
Report given 3e RN, transported to room via bed.

## 2021-03-28 NOTE — Assessment & Plan Note (Signed)
Ejection fraction 70 to 75% hyperdynamic with grade 2 diastolic dysfunction.  Volume status looks excellent currently.  No changes made.

## 2021-03-28 NOTE — Progress Notes (Signed)
[facility] for Infectious Disease  Date of Admission:  03/21/2021   Total days of inpatient antibiotics 7  Principal Problem:   Acute on chronic diastolic heart failure (HCC) Active Problems:   Tobacco use   History of rheumatic fever as a child   COPD (chronic obstructive pulmonary disease) (HCC)   Hyponatremia   AKI (acute kidney injury) ([place])   Hypertension   Pacemaker   Pulmonary vascular congestion   Acute metabolic encephalopathy   Persistent atrial fibrillation (HCC)   Chronic anticoagulation      Assessment: 70 YF with sinus node dysfunction SP PPM on 05/26/20, COPD, SLE on methotrexate admitted for hyponatremia found to have Ecoli bacteremia.    # Ecoli bacteremia suspected 2/2 pyelonephritis in an immunocompromised patient -On review of urgent care note on 11/28 pt had presented with complaint of lower abdominal pain, constipation, fatigue for about 2-3 days prior.  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 Leukocytosis could be reactive in the setting of SLE. Started on prednisone on 12/06. -Respiratory panel was negative on 12/6   #Hx of rheumatic heart disease -Today, patient reported a Hx of rheumatic fever at the age of [age]. She recalls her mother told her she received antibiotics for it. Her TTE showed no evidence of mitral or aortic valve abnormalities beside aortic valve with sclerosis/calcification. Discussed finding with Cardiology(Dr. Marlou Porch). As there are no concerning rheumatic  changes on TTE, no plans for TEE. As far as secondary prophylaxis with antibiotics for RHD she is out of the window period (10 years from last attack or till age 54) Recommendations: -Would consider TEE as she had arhythmia(newly discovered afib) and Hx of rheumatic fever. It can be done when pt is more stable.  -ID will follow peripherally Microbiology:   Antibiotics: Cefepime 11/30 Ceftriaxone 12/1-12/04 Cefazolin 12/05-p Metronidazole 11/30 Vancomycin 11/30-12/1 Cultures: Blood 11/302/2 Ecoli 12/2 NGTD 12/3 NGTD Urine 12/1 NGTD   SUBJECTIVE: Pt is sitting up in her chair, she reports she has history of rheumatic fever when she was 47-20 years old.   Review of Systems: Review of Systems  All other systems reviewed and are negative.   Scheduled Meds:  aspirin EC  81 mg Oral Daily   bisacodyl  10 mg Rectal Once   Chlorhexidine Gluconate Cloth  6 each Topical Daily   folic acid  1 mg Oral Daily   gabapentin  300 mg Oral TID   ipratropium  0.5 mg Nebulization TID   levalbuterol  0.63 mg Nebulization TID   mouth rinse  15 mL Mouth Rinse BID   nicotine  7 mg Transdermal Daily   polyethylene glycol  17 g Oral BID   predniSONE  40 mg Oral Q breakfast   senna-docusate  1 tablet Oral BID   sodium chloride flush  10-40 mL Intracatheter Q12H   sodium chloride  1 g Oral Q4H   Continuous Infusions:  sodium chloride 10 mL/hr at 03/27/21 1500   amiodarone 30 mg/hr (03/28/21 1007)    ceFAZolin (ANCEF) IV 2 g (03/28/21 0539)   diltiazem (  CARDIZEM) infusion 12.5 mg/hr (03/28/21 1007)   heparin 1,700 Units/hr (03/28/21 1016)   PRN Meds:.sodium chloride, acetaminophen **OR** acetaminophen, adenosine (ADENOCARD) IV, albuterol, benzonatate, labetalol, lip balm, morphine injection, nitroGLYCERIN, ondansetron **OR** ondansetron (ZOFRAN) IV, oxyCODONE, sodium chloride flush No Known Allergies  OBJECTIVE: Vitals:   03/28/21 1000 03/28/21 1100 03/28/21 1200 03/28/21 1215  BP: 131/70  112/76 119/84   Pulse: (!) 106 93 96   Resp: (!) 27 (!) 24 19   Temp:    98.4 F (36.9 C)  TempSrc:      SpO2: (!) 89% 97% 98%   Weight:      Height:       Body mass index is 23.39 kg/m.  Physical Exam Constitutional:      Appearance: Normal appearance.  HENT:     Head: Normocephalic and atraumatic.     Right Ear: Tympanic membrane normal.     Left Ear: Tympanic membrane normal.     Nose: Nose normal.     Mouth/Throat:     Mouth: Mucous membranes are moist.  Eyes:     Extraocular Movements: Extraocular movements intact.     Conjunctiva/sclera: Conjunctivae normal.     Pupils: Pupils are equal, round, and reactive to light.  Cardiovascular:     Rate and Rhythm: Normal rate and regular rhythm.     Heart sounds: No murmur heard.   No friction rub. No gallop.  Pulmonary:     Effort: Pulmonary effort is normal.     Breath sounds: Normal breath sounds.  Abdominal:     General: Abdomen is flat.     Palpations: Abdomen is soft.  Musculoskeletal:        General: Normal range of motion.  Skin:    General: Skin is warm and dry.  Neurological:     General: No focal deficit present.     Mental Status: She is alert and oriented to person, place, and time.  Psychiatric:        Mood and Affect: Mood normal.      Lab Results Lab Results  Component Value Date   WBC 21.2 (H) 03/28/2021   HGB 10.6 (L) 03/28/2021   HCT 29.7 (L) 03/28/2021   MCV 87.9 03/28/2021   PLT 281 03/28/2021    Lab Results  Component Value Date   CREATININE 1.12 (H) 03/28/2021   BUN 30 (H) 03/28/2021   NA 127 (L) 03/28/2021   K 4.3 03/28/2021   CL 94 (L) 03/28/2021   CO2 23 03/28/2021    Lab Results  Component Value Date   ALT 34 03/27/2021   AST 48 (H) 03/27/2021   ALKPHOS 74 03/27/2021   BILITOT 0.6 03/27/2021        Laurice Record, [facility] 03/28/2021, 12:53 PM

## 2021-03-28 NOTE — Plan of Care (Signed)

## 2021-03-28 NOTE — Assessment & Plan Note (Addendum)
Continues to improve 

## 2021-03-28 NOTE — Assessment & Plan Note (Signed)
Would be comfortable transitioning over to Eliquis 5 mg twice a day from IV heparin.  Discussed with primary team.

## 2021-03-28 NOTE — Progress Notes (Signed)
Progress Note  Patient Name: Sheila Morales Date of Encounter: 03/28/2021  [facility] Cardiologist: Elouise Munroe, MD   Subjective   Sitting up in chair, feeling better.  Less confusion.  Breathing has improved.  Still in atrial fibrillation.  Inpatient Medications    Scheduled Meds:  aspirin EC  81 mg Oral Daily   bisacodyl  10 mg Rectal Once   Chlorhexidine Gluconate Cloth  6 each Topical Daily   folic acid  1 mg Oral Daily   gabapentin  300 mg Oral TID   ipratropium  0.5 mg Nebulization TID   levalbuterol  0.63 mg Nebulization TID   mouth rinse  15 mL Mouth Rinse BID   nicotine  7 mg Transdermal Daily   polyethylene glycol  17 g Oral BID   predniSONE  40 mg Oral Q breakfast   senna-docusate  1 tablet Oral BID   sodium chloride flush  10-40 mL Intracatheter Q12H   sodium chloride  1 g Oral Q4H   Continuous Infusions:  sodium chloride 10 mL/hr at 03/27/21 1500   amiodarone 30 mg/hr (03/28/21 1007)    ceFAZolin (ANCEF) IV 2 g (03/28/21 0539)   diltiazem (CARDIZEM) infusion 12.5 mg/hr (03/28/21 1007)   heparin 1,700 Units/hr (03/28/21 1016)   PRN Meds: sodium chloride, acetaminophen **OR** acetaminophen, adenosine (ADENOCARD) IV, albuterol, benzonatate, labetalol, lip balm, morphine injection, nitroGLYCERIN, ondansetron **OR** ondansetron (ZOFRAN) IV, oxyCODONE, sodium chloride flush   Vital Signs    Vitals:   03/28/21 0400 03/28/21 0500 03/28/21 0739 03/28/21 0815  BP: 114/69     Pulse: 94   93  Resp: 17   (!) 25  Temp: 98 F (36.7 C)  98.3 F (36.8 C)   TempSrc: Oral  Oral   SpO2: 94%   98%  Weight:  58 kg    Height:        Intake/Output Summary (Last 24 hours) at 03/28/2021 1053 Last data filed at 03/28/2021 0304 Gross per 24 hour  Intake 1013.62 ml  Output 1960 ml  Net -946.38 ml   Last 3 Weights 03/28/2021 03/27/2021 03/25/2021  Weight (lbs) 127 lb 13.9 oz 126 lb 15.8 oz 128 lb 8.5 oz  Weight (kg) 58 kg 57.6 kg 58.3 kg      Telemetry     Atrial fibrillation with variable response mostly in the 80s to 90s.  Excellent.  Occasional ventricular pacing spikes noted.- Personally Reviewed  ECG    No new- Personally Reviewed  Physical Exam   GEN: No acute distress.   Neck: No JVD Cardiac: Irregularly irregular, no murmurs, rubs, or gallops.  Respiratory: Faint coarseness bilaterally GI: Soft, nontender, non-distended  MS: No edema; No deformity. Neuro:  Nonfocal  Psych: Normal affect   Labs    High Sensitivity Troponin:  No results for input(s): TROPONINIHS in the last 720 hours.   Chemistry Recent Labs  Lab 03/24/21 0011 03/24/21 0907 03/25/21 0123 03/26/21 0522 03/27/21 0213 03/27/21 1245 03/28/21 0226  NA 122* 125* 123*   < > 126* 121* 127*  K 3.3* 4.2 4.0   < > 4.7 4.0 4.3  CL 90* 91* 88*   < > 95* 89* 94*  CO2 21* 20* 20*   < > 19* 20* 23  GLUCOSE 152* 124* 309*   < > 125* 369* 154*  BUN 28* 28* 29*   < > 34* 29* 30*  CREATININE 1.60* 1.75* 1.55*   < > 1.30* 1.19* 1.12*  CALCIUM 8.2* 8.6*  7.9*   < > 8.9 8.3* 8.8*  MG 2.2 2.1  --   --   --   --  1.8  PROT 7.0 6.6  --   --   --  5.8*  --   ALBUMIN 3.4* 3.6 2.9*  --   --  2.5*  --   AST 27 26  --   --   --  48*  --   ALT 20 18  --   --   --  34  --   ALKPHOS 90 84  --   --   --  74  --   BILITOT 0.3 0.6  --   --   --  0.6  --   GFRNONAA 34* 31* 36*   < > 44* 49* 53*  ANIONGAP 11 14 15    < > 12 12 10    < > = values in this interval not displayed.    Lipids No results for input(s): CHOL, TRIG, HDL, LABVLDL, LDLCALC, CHOLHDL in the last 168 hours.  Hematology Recent Labs  Lab 03/26/21 0522 03/27/21 0213 03/28/21 0226  WBC 19.7* 22.2* 21.2*  RBC 3.37* 3.74* 3.38*  HGB 10.8* 11.6* 10.6*  HCT 29.7* 32.9* 29.7*  MCV 88.1 88.0 87.9  MCH 32.0 31.0 31.4  MCHC 36.4* 35.3 35.7  RDW 14.6 14.6 14.8  PLT 221 271 281   Thyroid  Recent Labs  Lab 03/22/21 0648  TSH 0.909    BNP Recent Labs  Lab 03/21/21 1206 03/21/21 1915 03/22/21 0648  03/24/21 0011 03/28/21 0226  BNP  --    < > 1,498.1* 2,707.6* 680.4*  PROBNP 1,279.0*  --   --   --   --    < > = values in this interval not displayed.    DDimer No results for input(s): DDIMER in the last 168 hours.   Radiology    DG CHEST PORT 1 VIEW  Result Date: 03/28/2021 CLINICAL DATA:  Pneumonia. EXAM: PORTABLE CHEST 1 VIEW COMPARISON:  CT chest dated March 26, 2021. Chest x-ray dated March 25, 2021. FINDINGS: Unchanged left chest wall pacemaker and cardiomegaly. Patchy bilateral airspace disease in both lungs appears improved in the left upper lobe, but not significantly changed on the right. No pleural effusion or pneumothorax. No acute osseous abnormality. IMPRESSION: 1. Multifocal pneumonia, improved in the left upper lobe. Electronically Signed   By: Titus Dubin M.D.   On: 03/28/2021 08:40   CT CHEST ABDOMEN PELVIS WO CONTRAST  Result Date: 03/26/2021 CLINICAL DATA:  Bacteremia, leukocytosis EXAM: CT CHEST, ABDOMEN AND PELVIS WITHOUT CONTRAST TECHNIQUE: Multidetector CT imaging of the chest, abdomen and pelvis was performed following the standard protocol without IV contrast. COMPARISON:  CT chest 03/21/2021, CT abdomen pelvis 11/23/2019 FINDINGS: CT CHEST FINDINGS Cardiovascular: Moderate multi-vessel coronary artery calcification. Global cardiac size is mildly enlarged, unchanged from prior examination. Left subclavian pacemaker leads are seen within the right atrium and right ventricle. No pericardial effusion. Central pulmonary arteries are enlarged in keeping with changes of pulmonary arterial hypertension. Mild atherosclerotic calcification within the thoracic aorta. No aortic aneurysm. Mediastinum/Nodes: No enlarged mediastinal, hilar, or axillary lymph nodes. Thyroid gland, trachea, and esophagus demonstrate no significant findings. Lungs/Pleura: Since the prior examination, extensive asymmetric ground-glass pulmonary infiltrate has developed within the upper lobes  bilaterally most in keeping with atypical infection in the acute setting. Small bilateral pleural effusions have developed, new since prior examination. No pneumothorax. No central obstructing lesion. Musculoskeletal: No acute bone abnormality. No  lytic or blastic bone lesion. CT ABDOMEN PELVIS FINDINGS Hepatobiliary: No focal liver abnormality is seen. Status post cholecystectomy. No biliary dilatation. Pancreas: Unremarkable Spleen: Unremarkable Adrenals/Urinary Tract: The adrenal glands are unremarkable. The kidneys are normal in size and position. Moderate right asymmetric perinephric stranding has developed, of unclear significance. This may relate to an underlying infectious, inflammatory, or traumatic process involving the right kidney. No intrarenal or ureteral calcifications are identified. There is no hydronephrosis. The bladder is unremarkable. Stomach/Bowel: Moderate sigmoid diverticulosis. The stomach, small bowel, and large bowel are otherwise unremarkable. No free intraperitoneal gas. Vascular/Lymphatic: Extensive aortoiliac atherosclerotic calcification. No aortic aneurysm. No pathologic adenopathy within the abdomen and pelvis. Reproductive: There is interval development of a 5.1 x 2.2 cm indeterminate cystic lesion within the left adnexa. This is not well assessed on this examination. No adnexal masses on the right. The uterus is unremarkable. Other: No abdominal wall hernia.  The rectum is unremarkable. Musculoskeletal: No acute bone abnormality. No lytic or blastic bone lesion. IMPRESSION: Extensive bilateral asymmetric upper lobe ground-glass pulmonary infiltrate, likely infectious in the acute setting. No central obstructing lesion. Moderate multi-vessel coronary artery calcification. Interval development of small bilateral pleural effusions. Interval development of moderate asymmetric right perinephric stranding. In absence of a history of trauma, this is most commonly seen in the setting of  pyelonephritis. Correlation with urinalysis and urine culture may be helpful for further management. Moderate sigmoid diverticulosis. Interval development of a 5.1 cm indeterminate cystic lesion within the left adnexa. This is not well characterized on this noncontrast examination. This would be better assessed with dedicated pelvic sonography. Aortic Atherosclerosis (ICD10-I70.0). Electronically Signed   By: Fidela Salisbury M.D.   On: 03/26/2021 19:41    Cardiac Studies   ECHO 03/22/21:   1. Left ventricular ejection fraction, by estimation, is 70 to 75%. The  left ventricle has hyperdynamic function. The left ventricle has no  regional wall motion abnormalities. There is mild left ventricular  hypertrophy. Left ventricular diastolic  parameters are consistent with Grade II diastolic dysfunction  (pseudonormalization). Elevated left atrial pressure.   2. Right ventricular systolic function is normal. The right ventricular  size is normal. There is mildly elevated pulmonary artery systolic  pressure.   3. Left atrial size was mildly dilated.   4. The mitral valve is normal in structure. Mild mitral valve  regurgitation. No evidence of mitral stenosis.   5. The aortic valve is tricuspid. Aortic valve regurgitation is not  visualized. Aortic valve sclerosis/calcification is present, without any  evidence of aortic stenosis.   6. The inferior vena cava is normal in size with greater than 50%  respiratory variability, suggesting right atrial pressure of 3 mmHg.   Comparison(s): No significant change from prior study.   Patient Profile     70 y.o. female with paroxysmal atrial fibrillation in the setting of respiratory failure COPD, pacemaker in place with chronic diastolic heart failure.  Assessment & Plan    Acute on chronic diastolic heart failure (HCC) Ejection fraction 70 to 75% hyperdynamic with grade 2 diastolic dysfunction.  Volume status looks excellent currently.  No changes  made.  Acute metabolic encephalopathy Mentally, she is clearing.  Was able to take some p.o. medications today.  Excellent.  History of rheumatic fever as a child Echocardiogram personally reviewed-no evidence of rheumatic changes on her heart valves.  She does have some mild aortic sclerosis.  No overall significant valvular abnormality such as mitral stenosis.  Pacemaker Functioning well.  Occasional pacer  spikes.  Remember, we have backup for our medications used to treat her tachycardia.  Persistent atrial fibrillation ([place]) Has been in and out of atrial fibrillation during this hospitalization.  Currently over the past 3 days she has been more persistent.  Well rate controlled.  Continue with amiodarone.  Perhaps tomorrow we can consider p.o. transition.  Would not be unreasonable to utilize amiodarone 400 mg p.o. twice daily at that point with taper to 200 mg p.o. twice a day for a week after that and then 200 mg a day thereafter.  There are no urgent needs for cardioversion at this time.  She is hemodynamically stable.  After anticoagulation straight for a total of 3 weeks, we could cardiovert her successfully as an outpatient electively if necessary without TEE.  Chronic anticoagulation Would be comfortable transitioning over to Eliquis 5 mg twice a day from IV heparin.  Discussed with primary team.    For questions or updates, please contact [place] Please consult [email or web address] for contact info under        Signed, Candee Furbish, MD  03/28/2021, 10:53 AM

## 2021-03-28 NOTE — Assessment & Plan Note (Signed)
Echocardiogram personally reviewed-no evidence of rheumatic changes on her heart valves.  She does have some mild aortic sclerosis.  No overall significant valvular abnormality such as mitral stenosis.

## 2021-03-28 NOTE — Progress Notes (Addendum)
ANTICOAGULATION CONSULT NOTE  Pharmacy Consult for heparin Indication: atrial fibrillation  No Known Allergies  Patient Measurements: Height: 5\' 2"  (157.5 cm) Weight: 58 kg (127 lb 13.9 oz) IBW/kg (Calculated) : 50.1 Heparin Dosing Weight: 60kg  Vital Signs: Temp: 98.3 F (36.8 C) (12/07 0739) Temp Source: Oral (12/07 0739) BP: 114/69 (12/07 0400) Pulse Rate: 94 (12/07 0400)  Labs: Recent Labs    03/26/21 0522 03/27/21 0213 03/27/21 1245 03/28/21 0226  HGB 10.8* 11.6*  --  10.6*  HCT 29.7* 32.9*  --  29.7*  PLT 221 271  --  281  HEPARINUNFRC 0.31 0.29*  --  0.50  CREATININE 1.60* 1.30* 1.19* 1.12*     Estimated Creatinine Clearance: 36.4 mL/min (A) (by C-G formula based on SCr of 1.12 mg/dL (H)).   Assessment: 80 YOF admitted with AMS.  Pharmacy consulted to manage IV heparin for new onset Afib RVR.  Heparin level therapeutic; no bleeding reported.  Goal of Therapy:  Heparin level 0.3-0.7 units/ml Monitor platelets by anticoagulation protocol: Yes   Plan:  Continue IV heparin at 1700 units/hr Daily heparin level and CBC F/U with transitioning to [place] D. Mina Marble, PharmD, BCPS, BCCCP 03/28/2021, 8:14 AM  =========================  Addendum:  Switch to Eliquis  Age < 80, SCr < 1.5, weight < 60 kg Start Eliquis 5mg  PO BID, stop IV heparin when Eliquis is administered Pharmacy will sign off and follow peripherally.  Thank you for the consult!  Nicolo Tomko D. Mina Marble, PharmD, BCPS, [place] 03/28/2021, 2:44 PM

## 2021-03-28 NOTE — Evaluation (Signed)
Physical Therapy Evaluation Patient Details Name: Sheila Morales MRN: [id number] DOB: November 21, 1950 Today's Date: 03/28/2021  History of Present Illness  70 yo admitted 11/30 with hyponatremia, AMS and tachypnea after leaving ED without receiving lab work results 11/28. 12/1 pt with new AFib with RVR. 12/3 worsening AMS and placed on Bipap. PMhx: lupus, COPD, PPM, neuropathy, colitis, RLS, BPPV  Clinical Impression  Pt admitted with/for the above stated problems.  Pt needing minimal assist overall from basic mobility and gait with RW.Marland Kitchen  Pt currently limited functionally due to the problems listed below.  (see problems list.)  Pt will benefit from PT to maximize function and safety to be able to get home safely with available assist.        Recommendations for follow up therapy are one component of a multi-disciplinary discharge planning process, led by the attending physician.  Recommendations may be updated based on patient status, additional functional criteria and insurance authorization.  Follow Up Recommendations Home health PT    Assistance Recommended at Discharge Intermittent Supervision/Assistance  Functional Status Assessment Patient has had a recent decline in their functional status and demonstrates the ability to make significant improvements in function in a reasonable and predictable amount of time.  Equipment Recommendations  Other (comment) (TBD)    Recommendations for Other Services       Precautions / Restrictions Precautions Precautions: Fall      Mobility  Bed Mobility Overal bed mobility: Needs Assistance Bed Mobility: Supine to Sit     Supine to sit: Min guard     General bed mobility comments: min guard for safety.    Transfers Overall transfer level: Needs assistance Equipment used: 2 person hand held assist Transfers: Sit to/from Stand;Bed to chair/wheelchair/BSC Sit to Stand: Min guard;+2 safety/equipment           General transfer comment: pt  up withe UE's and no external assist    Ambulation/Gait Ambulation/Gait assistance: Min assist;Min guard Gait Distance (Feet): 160 Feet Assistive device: Rolling walker (2 wheels);1 person hand held assist Gait Pattern/deviations: Step-through pattern   Gait velocity interpretation: <1.8 ft/sec, indicate of risk for recurrent falls   General Gait Details: antalgic on the left LE due to bone spur.  min assist without device and min to min guard with RW, more unsteady with distance and fatigue.  Stairs            Wheelchair Mobility    Modified Rankin (Stroke Patients Only)       Balance Overall balance assessment: Needs assistance Sitting-balance support: No upper extremity supported;Feet supported Sitting balance-Leahy Scale: Fair     Standing balance support: Bilateral upper extremity supported Standing balance-Leahy Scale: Poor Standing balance comment: reliant on external support or AD                             Pertinent Vitals/Pain Pain Assessment: No/denies pain    Home Living Family/patient expects to be discharged to:: Private residence Living Arrangements: Spouse/significant other Available Help at Discharge: Family Type of Home: House Home Access: Stairs to enter Entrance Stairs-Rails: None Entrance Stairs-Number of Steps: 2       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, 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)  Cognition Arousal/Alertness: Awake/alert Behavior During Therapy: WFL for tasks assessed/performed Overall Cognitive Status: Within Functional Limits for tasks assessed                                          General Comments General comments (skin integrity, edema, etc.): vss on 2L [place]    Exercises     Assessment/Plan    PT Assessment Patient needs continued PT services  PT Problem List Decreased strength;Decreased activity tolerance;Decreased balance;Decreased mobility;Decreased knowledge of use of DME;Cardiopulmonary status limiting activity       PT Treatment Interventions DME instruction;Gait training;Stair training;Functional mobility training;Therapeutic activities;Balance training;Patient/family education    PT Goals (Current goals can be found in the Care Plan section)  Acute Rehab PT Goals Patient Stated Goal: home at discharge PT Goal Formulation: With patient/family Time For Goal Achievement: 04/11/21 Potential to Achieve Goals: Good    Frequency Min 3X/week   Barriers to discharge        Co-evaluation               AM-PAC PT "6 Clicks" Mobility  Outcome Measure Help needed turning from your back to your side while in a flat bed without using bedrails?: A Little Help needed moving from lying on your back to sitting on the side of a flat bed without using bedrails?: A Little Help needed moving to and from a bed to a chair (including a wheelchair)?: A Little Help needed standing up from a chair using your arms (e.g., wheelchair or bedside chair)?: A Little Help needed to walk in hospital room?: A Little Help needed climbing 3-5 steps with a railing? : A Little 6 Click Score: 18    End of Session Equipment Utilized During Treatment: Oxygen Activity Tolerance: Patient tolerated treatment well;Patient limited by fatigue Patient left: in chair;with call bell/phone within reach;with chair alarm set;with family/visitor  present Nurse Communication: Mobility status PT Visit Diagnosis: Other abnormalities of gait and mobility (R26.89);Muscle weakness (generalized) (M62.81)    Time: 2878-6767 PT Time Calculation (min) (ACUTE ONLY): 29 min   Charges:   PT Evaluation $PT Eval Moderate Complexity: 1 Mod PT Treatments $Gait Training: 8-22 mins        03/28/2021  Ginger Carne., PT Acute Rehabilitation Services [phone]  (pager) [phone]  (office)  Tessie Fass Jamelah Sitzer 03/28/2021, 5:30 PM

## 2021-03-28 NOTE — Assessment & Plan Note (Signed)
Functioning well.  Occasional pacer spikes.  Remember, we have backup for our medications used to treat her tachycardia.

## 2021-03-28 NOTE — Progress Notes (Signed)
NAME:  Sheila Morales, MRN:  [id number], DOB:  09/08/1950, LOS: 6 ADMISSION DATE:  03/21/2021, CONSULTATION DATE:  03/24/2021 REFERRING MD:  Dr. Alfredia Ferguson, CHIEF COMPLAINT: A. fib RVR with AMS  History of Present Illness:  Sheila Morales is a 70 y.o. with a past medical history significant for COPD, HFpEF, systemic lupus erythematosus, and ischemic colitis who presented to the emergency department 11/30 for complaints of dyspnea, fatigue, weakness, and altered mental status.  Per husband symptoms began 4 days prior to admission with a worrisome symptom of cough and malaise.  States he attempted to seek medical treatment for patient at local ER/urgent care but t when the wait time exceeded 11 hours they decided to leave and not be seen.  Admitted to [facility] with diastolic HF exacerbation, AKI, and hyponatremia.  Found to have e.coli bacteremia.  Morning of 12/3 patent has again developed worsening mentation with A-fib RVR with recurrent low-grade fever, tachycardia, and hypertension.  PCCM consulted for further management and transfer to ICU  Pertinent  Medical History  COPD, tobacco abuse  (RB pt) HFpEF Systemic lupus erythematosus on methotrexate  Ischemic colitis Sinus node dysfunction s/p biotronik dual chamber PPM 05/2020  Significant Hospital Events: Including procedures, antibiotic start and stop dates in addition to other pertinent events   11/30 admitted for generalized fatigue, weakness, and altered mental status found to be bacteremic with E. Coli and UTI.  Patient was also with several metabolic derangements including severely hyponatremic with a sodium of 117 12/1 Nephrology and Cardiology consulted for AKI/ hyponatremia and and HF exacerbation/ AF w/ RVR 12/2 ID consult 12/3 transfer to ICU for acute encephalopathy and AF/RVR 12/4 Came off BiPAP this morning. Napping. Still lethargic. No abdominal pain 12/6 Off BiPAP this am, some abdominal muscle use noted, on 4 L HFNC with sats of  92%  11/30 SARS/ flu > neg 11/30 Bcx2 > pansensitive e.coli 12/1 MRSA PCR > neg 12/1 UC  > neg 12/2 Bcx 2 > ngtd 12/3 Bcx 2 > ngtd  11/30 cefepime 11/30 flagyl 11/30 vanc > 12/1 12/1 ceftriaxone >  Interim History / Subjective:  12/7: no acute events noted overnight. Pt is oob this am and on Garey. Mental status appears to be improving with addition of gabapentin and nicotine patch. Oxygenation also improving with steroid addition. Stable for transfer from ICU at this time we will ask TRH to assume care.  Objective   Blood pressure 114/85, pulse 94, temperature 98.4 F (36.9 C), resp. rate 13, height 5\' 2"  (1.575 m), weight 58 kg, SpO2 93 %.        Intake/Output Summary (Last 24 hours) at 03/28/2021 1352 Last data filed at 03/28/2021 0304 Gross per 24 hour  Intake 695.11 ml  Output 1150 ml  Net -454.89 ml   Filed Weights   03/25/21 0500 03/27/21 0543 03/28/21 0500  Weight: 58.3 kg 57.6 kg 58 kg    Examination: General:  Older female up oob in chair in no acute distress, arouses but drowsy as well HEENT: ncat, MM pink/moist , No LAD Neuro:  Alert, pleasant.  MAE CV: afib with RVR/ occ PVC, S1, S2. Irr, No RMG PULM:  Bilateral chest excursion, intermittently tachypneic and labored at times, rales per RUL and bibasilar but otherwise no wheeze/ rhonchi, some abdominal muscle use noted GI: soft, mildly diffusely tender, hyper BS, purwick Extremities: warm/dry, no LE edema , no obvious deformities Skin: no rashes , + multiple skin tears  , warm dry and  intact.     Resolved Hospital Problem list     Assessment & Plan:  Acute Hypoxic and hypercapnic Respiratory Failure  Current every day smoker - Felt secondary to possible CAP versus pulmonary edema -Chest CT on admission negative for signs of acute pneumonia revealed mild emphysema with questionable mild degree of fibrosis and cardiomegaly, hx of SLE Subsequent CT Chest 12/5>> Extensive upper lobe GG pulmonary infiltrate,  suspect infectious, Hx of COPD with daily tobacco use  P: - off bipap today - wean supplemental O2 as tolerated with sat goals of 88-92% - continue with xopenex/ atrovent q6hr with prn albuterol - ceftriaxone as below - early ILD changes on CT w/ hx of SLE > may need further workup as OP - monitor volume status closely, gentle diuresis 12/6 ( Lasix 20 mg)  -  urine legionella negative - cont vPrednisone 40 mg daily with slow wean ( Fibrotic changes per CT Chest) _ Will add Nicoderm patch >> 7 mg ( Per husband patient smoker about 5-6 cigarettes daily)   - Husband stated previously patient would want to be intubated if needed but patient would not want prolonged aggressive measures  E. Coli bacteremia  CT Abdomen pelvis with moderate asymmetric right perinephric Stranding, concerning for pyelonephritis ( In absence of trauma)  -Felt secondary to UTI in immunocompromised patient although UC neg (but was after abx) P: - ID following, appreciate assistance - Continue to follow repeat blood cultures, negative to date and neg TTE 12/1, may need to consider TEE when pt clinically stable for such - ID changed abx to cefazolin  - Will defer to ID regarding further cultures , ABX treatment   Acute on chronic diastolic heart failure -ECHO 12/1 with EF 70-75 and grade II diastolic dysfunction  Hx of HTN New onset A. fib RVR -Felt secondary to sepsis History of sick sinus syndrome s/p permanent pacemaker P: - Cardiology following, appreciate assistance - amio and cardizem gtt per cards.. change to oral amio today as well.  - start oral a/c with eliquis today.  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 - adding bowel regimen today.  Husband states she struggles between diarrhea and constipation for years but recently started herbal supplement and has done well  Needs Pelvic US to better evaluate once able to tolerate lying flat  Peripheral Neuropathy  - on home gabapentin ?900mg  in am and 1200mg  at [facility], last dose 12/1 -cont low dose gabapentin 12/6 , and avoid morphine/ oxy IR.  Best  Practice (right click and "Reselect all SmartList Selections" daily)   Diet/type: Regular consistency (see orders) DVT prophylaxis: systemic heparin GI prophylaxis: PPI Lines: N/A Foley:  N/A Code Status:  full code Last date of multidisciplinary goals of care discussion: Husband updated at bedside      care time 35 mins. This represents my time independent of the NP's/PA's/med student/residents time taking care of the pt. This is excluding procedures   [facility] 03/28/2021, 1:56 PM See Amion for pager If no response to pager, please call [phone] until 1900 After 1900 please call [facility] [phone]

## 2021-03-29 ENCOUNTER — Inpatient Hospital Stay (HOSPITAL_COMMUNITY): Payer: PPO

## 2021-03-29 LAB — CBC
HCT: 29.4 % — ABNORMAL LOW (ref 36.0–46.0)
Hemoglobin: 10.4 g/dL — ABNORMAL LOW (ref 12.0–15.0)
MCH: 31 pg (ref 26.0–34.0)
MCHC: 35.4 g/dL (ref 30.0–36.0)
MCV: 87.5 fL (ref 80.0–100.0)
Platelets: 343 10*3/uL (ref 150–400)
RBC: 3.36 MIL/uL — ABNORMAL LOW (ref 3.87–5.11)
RDW: 15.1 % (ref 11.5–15.5)
WBC: 21.6 10*3/uL — ABNORMAL HIGH (ref 4.0–10.5)
nRBC: 0 % (ref 0.0–0.2)

## 2021-03-29 LAB — CULTURE, BLOOD (ROUTINE X 2)
Culture: NO GROWTH
Culture: NO GROWTH
Special Requests: ADEQUATE

## 2021-03-29 LAB — BASIC METABOLIC PANEL
Anion gap: 8 (ref 5–15)
BUN: 28 mg/dL — ABNORMAL HIGH (ref 8–23)
CO2: 24 mmol/L (ref 22–32)
Calcium: 9.1 mg/dL (ref 8.9–10.3)
Chloride: 96 mmol/L — ABNORMAL LOW (ref 98–111)
Creatinine, Ser: 1.12 mg/dL — ABNORMAL HIGH (ref 0.44–1.00)
GFR, Estimated: 53 mL/min — ABNORMAL LOW (ref >60)
Glucose, Bld: 147 mg/dL — ABNORMAL HIGH (ref 70–99)
Potassium: 5 mmol/L (ref 3.5–5.1)
Sodium: 128 mmol/L — ABNORMAL LOW (ref 135–145)

## 2021-03-29 NOTE — Progress Notes (Signed)
PROGRESS NOTE    Sheila Morales  IOE:[id number] DOB: 08-04-1950 DOA: 03/21/2021 PCP: Hoyt Koch, MD   Brief Narrative:  Sheila Morales is a 70 y.o. with a past medical history significant for COPD, HFpEF, systemic lupus erythematosus, and ischemic colitis who presented to the emergency department 11/30 for complaints of dyspnea, fatigue, weakness, and altered mental status.  Per husband symptoms began 4 days prior to admission with a worrisome symptom of cough and malaise.  States he attempted to seek medical treatment for patient at local ER/urgent care but t when the wait time exceeded 11 hours they decided to leave and not be seen.  Admitted to [facility] with diastolic HF exacerbation, AKI, and hyponatremia.  Found to have e.coli bacteremia.   Morning of 12/3 patent has again developed worsening mentation with A-fib RVR with recurrent low-grade fever, tachycardia, and hypertension.  PCCM consulted for further management and transfer to ICU  Significant Hospital Events: Including procedures, antibiotic start and stop dates in addition to other pertinent events   11/30 admitted for generalized fatigue, weakness, and altered mental status found to be bacteremic with E. Coli and UTI.  Patient was also with several metabolic derangements including severely hyponatremic with a sodium of 117 12/1 Nephrology and Cardiology consulted for AKI/ hyponatremia and and HF exacerbation/ AF w/ RVR 12/2 ID consult 12/3 transfer to ICU for acute encephalopathy and AF/RVR 12/4 Came off BiPAP this morning. Napping. Still lethargic. No abdominal pain 12/6 Off BiPAP this am, some abdominal muscle use noted, on 4 L HFNC with sats of 92% 12/8: Transferred to [facility]   11/30 SARS/ flu > neg 11/30 Bcx2 > pansensitive e.coli 12/1 MRSA PCR > neg 12/1 UC  > neg 12/2 Bcx 2 > ngtd 12/3 Bcx 2 > ngtd   11/30 cefepime 11/30 flagyl 11/30 vanc > 12/1 12/1 ceftriaxone >    Assessment & Plan:   Principal Problem:    Acute on chronic diastolic heart failure (HCC) Active Problems:   Tobacco use   Irritable bowel syndrome   History of rheumatic fever as a child   COPD (chronic obstructive pulmonary disease) (HCC)   Hyponatremia   AKI (acute kidney injury) ([place])   Hypertension   Pacemaker   Pulmonary vascular congestion   Acute metabolic encephalopathy   Persistent atrial fibrillation (HCC)   Chronic anticoagulation  Acute Hypoxic and hypercapnic Respiratory Failure: secondary to possible CAP versus pulmonary edema.  She is daily smoker.  Chest CT on admission negative for signs of acute pneumonia revealed mild emphysema with questionable mild degree of fibrosis and cardiomegaly, hx of SLE Subsequent CT Chest 12/5>> Extensive upper lobe GG pulmonary infiltrate, suspect infectious.  She required BiPAP in the ICU.  Currently she is on 2 L and saturating 94%. continue with xopenex/ atrovent q6hr with prn albuterol.  On cefazolin. 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 urine legionella negative. cont vPrednisone 40 mg daily with slow wean ( Fibrotic changes per CT Chest)   E. Coli bacteremia secondary to pyelonephritis: CT Abdomen pelvis with moderate asymmetric right perinephric Stranding, concerning for pyelonephritis. Felt secondary to UTI in immunocompromised patient although UC neg (but was after abx) P: ID following, neg TTE 12/1, continue cefazolin with end date 03/30/2021 per ID recommendations.   Acute on chronic diastolic heart failure: ECHO 12/1 with EF 70-75 and grade II diastolic dysfunction.  Now compensated.  HTN: Controlled  New onset A. fib RVR:  Likely secondary to acute illness.  Patient was transition from IV amiodarone to oral amiodarone 03/28/2021, still remains on diltiazem, heart rate is still around 110 without any activity, increased her diltiazem to 15 mg per cardiology recommendations.  Plan for transitioning to oral Cardizem tomorrow.  Appreciate  cardiology help and defer management to them.  History of sick sinus syndrome s/p permanent pacemaker: Cardiology following, appreciate assistance   Acute metabolic encephalopathy: Resolved.  She is fully alert and oriented.   Acute Kidney Injury, non-oliguric.  Creatinine peaked at 2.11.  Now 1.12.  Baseline creatinine 0.7-0.9.  -Acute on chronic hyponatremia: Lowest was 113 during this hospitalization, currently 128.  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  TSH normal.  They recommended continuing fluid restriction and added salt tablets.   Hx of SLE -Home medications include Plaquenil and Methotrexate  Medication remains on hold Supportive care   Normocytic anemia: Hemoglobin is stable.  CT  Abdomen pelvis with 5.1 cm indeterminate cystic lesion within the left adnexa.  We will proceed with pelvic ultrasound.   Peripheral Neuropathy  - on home gabapentin ?900mg  in am and 1200mg  at HS, last dose 12/1 -cont low dose gabapentin 12/6 , and avoid morphine/ oxy IR.   DVT prophylaxis: SCDs Start: 03/22/21 8101   Code Status: Full Code  Family Communication: Husband present at bedside.  Plan of care discussed with patient in length and he verbalized understanding and agreed with it.  Status is: Inpatient  Remains inpatient appropriate because: Needs management of A. fib with RVR.  Estimated body mass index is 25.11 kg/m as calculated from the following:   Height as of this encounter: 5\' 2"  (1.575 m).   Weight as of this encounter: 62.3 kg.   Nutritional Assessment: Body mass index is 25.11 kg/m.Marland Kitchen Seen by dietician.  I agree with the assessment and plan as outlined below: Nutrition Status:   Skin Assessment: I have examined the patient's skin and I agree with the wound assessment as performed by the wound care RN as outlined below:    Consultants:  Cardiology Nephrology-signed off ID-signed off  Procedures:   None  Antimicrobials:  Anti-infectives (From admission, onward)    Start     Dose/Rate Route Frequency Ordered Stop   03/27/21 1500  ceFAZolin (ANCEF) IVPB 2g/100 mL premix        2 g 200 mL/hr over 30 Minutes Intravenous Every 8 hours 03/27/21 1425 03/30/21 2359   03/26/21 1800  ceFAZolin (ANCEF) IVPB 2g/100 mL premix  Status:  Discontinued        2 g 200 mL/hr over 30 Minutes Intravenous Every 12 hours 03/26/21 1046 03/27/21 1425   03/22/21 2000  ceFEPIme (MAXIPIME) 2 g in sodium chloride 0.9 % 100 mL IVPB  Status:  Discontinued        2 g 200 mL/hr over 30 Minutes Intravenous Every 24 hours 03/21/21 2209 03/22/21 1456   03/22/21 1800  cefTRIAXone (ROCEPHIN) 2 g in sodium chloride 0.9 % 100 mL IVPB  Status:  Discontinued        2 g 200 mL/hr over 30 Minutes Intravenous Every 24 hours 03/22/21 1456 03/26/21 1046   03/22/21 1600  vancomycin (VANCOREADY) IVPB 750 mg/150 mL  Status:  Discontinued        750 mg 150 mL/hr over 60 Minutes Intravenous Every 24 hours 03/22/21 1419 03/23/21 1018   03/21/21 2209  vancomycin variable dose per unstable renal function (pharmacist  dosing)  Status:  Discontinued         Does not apply See admin instructions 03/21/21 2209 03/22/21 1425   03/21/21 1930  ceFEPIme (MAXIPIME) 2 g in sodium chloride 0.9 % 100 mL IVPB        2 g 200 mL/hr over 30 Minutes Intravenous  Once 03/21/21 1916 03/21/21 2203   03/21/21 1930  metroNIDAZOLE (FLAGYL) IVPB 500 mg        500 mg 100 mL/hr over 60 Minutes Intravenous  Once 03/21/21 1916 03/21/21 2101   03/21/21 1930  vancomycin (VANCOCIN) IVPB 1000 mg/200 mL premix        1,000 mg 200 mL/hr over 60 Minutes Intravenous  Once 03/21/21 1916 03/21/21 2253          Subjective: Seen and examined.  Husband the bedside.  Patient fully alert and oriented.  Denied any shortness of breath, chest pain or any other complaint.  Objective: Vitals:   03/29/21 0947 03/29/21 0952 03/29/21 0959 03/29/21 1200  BP:    (!)  116/40  Pulse:    73  Resp: (!) 29 (!) 23 19 19   Temp:    98 F (36.7 C)  TempSrc:    Oral  SpO2:    97%  Weight:      Height:        Intake/Output Summary (Last 24 hours) at 03/29/2021 1246 Last data filed at 03/29/2021 1000 Gross per 24 hour  Intake 781.68 ml  Output 1150 ml  Net -368.32 ml   Filed Weights   03/28/21 2057 03/29/21 0039 03/29/21 0500  Weight: 62.2 kg 62.3 kg 62.3 kg    Examination:  General exam: Appears calm and comfortable  Respiratory system: Clear to auscultation. Respiratory effort normal. Cardiovascular system: S1 & S2 heard, irregularly irregular rate and rhythm. No JVD, murmurs, rubs, gallops or clicks. No pedal edema. Gastrointestinal system: Abdomen is nondistended, soft and nontender. No organomegaly or masses felt. Normal bowel sounds heard. Central nervous system: Alert and oriented. No focal neurological deficits. Extremities: Symmetric 5 x 5 power. Skin: No rashes, lesions or ulcers Psychiatry: Judgement and insight appear normal. Mood & affect appropriate.    Data Reviewed: I have personally reviewed following labs and imaging studies  CBC: Recent Labs  Lab 03/22/21 1431 03/23/21 0039 03/24/21 0011 03/24/21 0907 03/25/21 0123 03/26/21 0522 03/27/21 0213 03/28/21 0226 03/29/21 0328  WBC 19.1* 16.1* 15.5* 15.1* 16.9* 19.7* 22.2* 21.2* 21.6*  NEUTROABS 16.4* 13.0* 12.0* 11.4*  --   --   --  18.8*  --   HGB 12.8 11.9* 10.8* 10.7* 10.8* 10.8* 11.6* 10.6* 10.4*  HCT 36.6 32.8* 29.3* 29.6* 29.3* 29.7* 32.9* 29.7* 29.4*  MCV 92.2 89.9 87.5 87.8 88.3 88.1 88.0 87.9 87.5  PLT 153 169 167 168 162 221 271 281 480   Basic Metabolic Panel: Recent Labs  Lab 03/22/21 1431 03/22/21 1939 03/23/21 0039 03/24/21 0011 03/24/21 0907 03/25/21 0123 03/26/21 0522 03/27/21 0213 03/27/21 1245 03/28/21 0226 03/29/21 0328  NA 124*   < > 122* 122* 125* 123* 124* 126* 121* 127* 128*  K 4.0   < > 3.7 3.3* 4.2 4.0 3.1* 4.7 4.0 4.3 5.0  CL 91*   <  > 89* 90* 91* 88* 89* 95* 89* 94* 96*  CO2 21*   < > 20* 21* 20* 20* 23 19* 20* 23 24  GLUCOSE 111*   < > 96 152* 124* 309* 243* 125* 369* 154* 147*  BUN 32*   < >  35* 28* 28* 29* 40* 34* 29* 30* 28*  CREATININE 2.11*   < > 2.03* 1.60* 1.75* 1.55* 1.60* 1.30* 1.19* 1.12* 1.12*  CALCIUM 8.9   < > 8.3* 8.2* 8.6* 7.9* 8.2* 8.9 8.3* 8.8* 9.1  MG 2.0  --  1.8 2.2 2.1  --   --   --   --  1.8  --   PHOS 4.4  --  3.7 2.9 3.2 3.0  --   --   --   --   --    < > = values in this interval not displayed.   GFR: Estimated Creatinine Clearance: 40 mL/min (A) (by C-G formula based on SCr of 1.12 mg/dL (H)). Liver Function Tests: Recent Labs  Lab 03/22/21 1431 03/23/21 0039 03/24/21 0011 03/24/21 0907 03/25/21 0123 03/27/21 1245  AST 42* 31 27 26   --  48*  ALT 26 23 20 18   --  34  ALKPHOS 122 118 90 84  --  74  BILITOT 1.0 0.5 0.3 0.6  --  0.6  PROT 6.9 6.4* 7.0 6.6  --  5.8*  ALBUMIN 3.0* 2.7* 3.4* 3.6 2.9* 2.5*   No results for input(s): LIPASE, AMYLASE in the last 168 hours. No results for input(s): AMMONIA in the last 168 hours. Coagulation Profile: No results for input(s): INR, PROTIME in the last 168 hours. Cardiac Enzymes: No results for input(s): CKTOTAL, CKMB, CKMBINDEX, TROPONINI in the last 168 hours. BNP (last 3 results) Recent Labs    03/21/21 1206  PROBNP 1,279.0*   HbA1C: No results for input(s): HGBA1C in the last 72 hours. CBG: Recent Labs  Lab 03/25/21 1946 03/25/21 2311 03/26/21 0416 03/27/21 1149 03/28/21 0735  GLUCAP 145* 147* 123* 106* 146*   Lipid Profile: No results for input(s): CHOL, HDL, LDLCALC, TRIG, CHOLHDL, LDLDIRECT in the last 72 hours. Thyroid Function Tests: No results for input(s): TSH, T4TOTAL, FREET4, T3FREE, THYROIDAB in the last 72 hours. Anemia Panel: No results for input(s): VITAMINB12, FOLATE, FERRITIN, TIBC, IRON, RETICCTPCT in the last 72 hours. Sepsis Labs: Recent Labs  Lab 03/22/21 1431 03/22/21 1656 03/24/21 0906  03/24/21 1523  LATICACIDVEN 1.3 1.0 1.0 1.0    Recent Results (from the past 240 hour(s))  Resp Panel by RT-PCR (Flu A&B, Covid) Nasopharyngeal Swab     Status: None   Collection Time: 03/21/21  7:15 PM   Specimen: Nasopharyngeal Swab; Nasopharyngeal(NP) swabs in vial transport medium  Result Value Ref Range Status   SARS Coronavirus 2 by RT PCR NEGATIVE NEGATIVE Final    Comment: (NOTE) SARS-CoV-2 target nucleic acids are NOT DETECTED.  The SARS-CoV-2 RNA is generally detectable in upper respiratory specimens during the acute phase of infection. The lowest concentration of SARS-CoV-2 viral copies this assay can detect is 138 copies/mL. A negative result does not preclude SARS-Cov-2 infection and should not be used as the sole basis for treatment or other patient management decisions. A negative result may occur with  improper specimen collection/handling, submission of specimen other than nasopharyngeal swab, presence of viral mutation(s) within the areas targeted by this assay, and inadequate number of viral copies(<138 copies/mL). A negative result must be combined with clinical observations, patient history, and epidemiological information. The expected result is Negative.  Fact Sheet for Patients:  EntrepreneurPulse.com.au  Fact Sheet for Healthcare Providers:  IncredibleEmployment.be  This test is no t yet approved or cleared by the [place] FDA and  has been authorized for detection and/or diagnosis of SARS-CoV-2 by FDA under an  Emergency Use Authorization (EUA). This EUA will remain  in effect (meaning this test can be used) for the duration of the COVID-19 declaration under Section 564(b)(1) of the Act, 21 U.S.C.section 360bbb-3(b)(1), unless the authorization is terminated  or revoked sooner.       Influenza A by PCR NEGATIVE NEGATIVE Final   Influenza B by PCR NEGATIVE NEGATIVE Final    Comment: (NOTE) The Xpert Xpress  SARS-CoV-2/FLU/RSV plus assay is intended as an aid in the diagnosis of influenza from Nasopharyngeal swab specimens and should not be used as a sole basis for treatment. Nasal washings and aspirates are unacceptable for Xpert Xpress SARS-CoV-2/FLU/RSV testing.  Fact Sheet for Patients: EntrepreneurPulse.com.au  Fact Sheet for Healthcare Providers: IncredibleEmployment.be  This test is not yet approved or cleared by the [place] FDA and has been authorized for detection and/or diagnosis of SARS-CoV-2 by FDA under an Emergency Use Authorization (EUA). This EUA will remain in effect (meaning this test can be used) for the duration of the COVID-19 declaration under Section 564(b)(1) of the Act, 21 U.S.C. section 360bbb-3(b)(1), unless the authorization is terminated or revoked.  Performed at [facility], [address] [place], [place] [address]   Blood Culture (routine x 2)     Status: Abnormal   Collection Time: 03/21/21  7:15 PM   Specimen: Left Antecubital; Blood  Result Value Ref Range Status   Specimen Description   Final    LEFT ANTECUBITAL Performed at [facility], [address] [place], [place] [address]    Special Requests   Final    BOTTLES DRAWN AEROBIC AND ANAEROBIC Blood Culture adequate volume Performed at [facility], [address] [place], [place] [address]    Culture  Setup Time   Final    GRAM NEGATIVE RODS IN BOTH AEROBIC AND ANAEROBIC BOTTLES CRITICAL RESULT CALLED TO, READ BACK BY AND VERIFIED WITH: Andres Shad PHARMD 1407 03/22/21 A BROWNING    Culture (A)  Final    ESCHERICHIA COLI SUSCEPTIBILITIES PERFORMED ON PREVIOUS CULTURE WITHIN THE LAST 5 DAYS. Performed at [facility], [place] [address] [place], [place] [address]    Report Status 03/24/2021 FINAL  Final  Blood Culture (routine x 2)     Status: Abnormal   Collection Time: 03/21/21  7:15 PM   Specimen: Right  Antecubital; Blood  Result Value Ref Range Status   Specimen Description   Final    RIGHT ANTECUBITAL Performed at [facility], [address] [place], [place] [address]    Special Requests   Final    BOTTLES DRAWN AEROBIC AND ANAEROBIC Blood Culture adequate volume Performed at [facility], [address] [place], [place] [address]    Culture  Setup Time   Final    GRAM NEGATIVE RODS IN BOTH AEROBIC AND ANAEROBIC BOTTLES CRITICAL RESULT CALLED TO, READ BACK BY AND VERIFIED WITHAndres Shad PHARMD 5035 03/22/21 A BROWNING Performed at [facility], [place] [address] [place], [place] [address]    Culture ESCHERICHIA COLI (A)  Final   Report Status 03/24/2021 FINAL  Final   Organism ID, Bacteria ESCHERICHIA COLI  Final      Susceptibility   Escherichia coli - MIC*    AMPICILLIN 4 SENSITIVE Sensitive     CEFAZOLIN <=4 SENSITIVE Sensitive     CEFEPIME <=0.12 SENSITIVE Sensitive     CEFTAZIDIME <=1 SENSITIVE Sensitive  CEFTRIAXONE <=0.25 SENSITIVE Sensitive     CIPROFLOXACIN <=0.25 SENSITIVE Sensitive     GENTAMICIN <=1 SENSITIVE Sensitive     IMIPENEM <=0.25 SENSITIVE Sensitive     TRIMETH/SULFA <=20 SENSITIVE Sensitive     AMPICILLIN/SULBACTAM <=2 SENSITIVE Sensitive     PIP/TAZO <=4 SENSITIVE Sensitive     * ESCHERICHIA COLI  Blood Culture ID Panel (Reflexed)     Status: Abnormal   Collection Time: 03/21/21  7:15 PM  Result Value Ref Range Status   Enterococcus faecalis NOT DETECTED NOT DETECTED Final   Enterococcus Faecium NOT DETECTED NOT DETECTED Final   Listeria monocytogenes NOT DETECTED NOT DETECTED Final   Staphylococcus species NOT DETECTED NOT DETECTED Final   Staphylococcus aureus (BCID) NOT DETECTED NOT DETECTED Final   Staphylococcus epidermidis NOT DETECTED NOT DETECTED Final   Staphylococcus lugdunensis NOT DETECTED NOT DETECTED Final   Streptococcus species NOT DETECTED NOT DETECTED Final   Streptococcus agalactiae NOT DETECTED NOT  DETECTED Final   Streptococcus pneumoniae NOT DETECTED NOT DETECTED Final   Streptococcus pyogenes NOT DETECTED NOT DETECTED Final   A.calcoaceticus-baumannii NOT DETECTED NOT DETECTED Final   Bacteroides fragilis NOT DETECTED NOT DETECTED Final   Enterobacterales DETECTED (A) NOT DETECTED Final    Comment: Enterobacterales represent a large order of gram negative bacteria, not a single organism. CRITICAL RESULT CALLED TO, READ BACK BY AND VERIFIED WITH: Andres Shad PHARMD 1407 03/22/21 A BROWNING    Enterobacter cloacae complex NOT DETECTED NOT DETECTED Final   Escherichia coli DETECTED (A) NOT DETECTED Final    Comment: CRITICAL RESULT CALLED TO, READ BACK BY AND VERIFIED WITH: Andres Shad PHARMD 1407 03/22/21 A BROWNING    Klebsiella aerogenes NOT DETECTED NOT DETECTED Final   Klebsiella oxytoca NOT DETECTED NOT DETECTED Final   Klebsiella pneumoniae NOT DETECTED NOT DETECTED Final   Proteus species NOT DETECTED NOT DETECTED Final   Salmonella species NOT DETECTED NOT DETECTED Final   Serratia marcescens NOT DETECTED NOT DETECTED Final   Haemophilus influenzae NOT DETECTED NOT DETECTED Final   Neisseria meningitidis NOT DETECTED NOT DETECTED Final   Pseudomonas aeruginosa NOT DETECTED NOT DETECTED Final   Stenotrophomonas maltophilia NOT DETECTED NOT DETECTED Final   Candida albicans NOT DETECTED NOT DETECTED Final   Candida auris NOT DETECTED NOT DETECTED Final   Candida glabrata NOT DETECTED NOT DETECTED Final   Candida krusei NOT DETECTED NOT DETECTED Final   Candida parapsilosis NOT DETECTED NOT DETECTED Final   Candida tropicalis NOT DETECTED NOT DETECTED Final   Cryptococcus neoformans/gattii NOT DETECTED NOT DETECTED Final   CTX-M ESBL NOT DETECTED NOT DETECTED Final   Carbapenem resistance IMP NOT DETECTED NOT DETECTED Final   Carbapenem resistance KPC NOT DETECTED NOT DETECTED Final   Carbapenem resistance NDM NOT DETECTED NOT DETECTED Final   Carbapenem resist OXA 48 LIKE NOT  DETECTED NOT DETECTED Final   Carbapenem resistance VIM NOT DETECTED NOT DETECTED Final    Comment: Performed at [facility], [address], [place], [place] [address]  Urine Culture     Status: None   Collection Time: 03/22/21  2:07 PM   Specimen: In/Out Cath Urine  Result Value Ref Range Status   Specimen Description IN/OUT CATH URINE  Final   Special Requests NONE  Final   Culture   Final    NO GROWTH Performed at [facility], [place] [address], [place], [place] [address]    Report Status 03/23/2021 FINAL  Final  MRSA Next  Gen by PCR, Nasal     Status: None   Collection Time: 03/22/21  3:00 PM  Result Value Ref Range Status   MRSA by PCR Next Gen NOT DETECTED NOT DETECTED Final    Comment: (NOTE) The GeneXpert MRSA Assay (FDA approved for NASAL specimens only), is one component of a comprehensive MRSA colonization surveillance program. It is not intended to diagnose MRSA infection nor to guide or monitor treatment for MRSA infections. Test performance is not FDA approved in patients less than 18 years old. Performed at [facility], [place] [address] [place], [place] [address]   Culture, blood (routine x 2)     Status: None   Collection Time: 03/23/21  5:44 PM   Specimen: BLOOD  Result Value Ref Range Status   Specimen Description BLOOD LEFT ANTECUBITAL  Final   Special Requests   Final    BOTTLES DRAWN AEROBIC AND ANAEROBIC Blood Culture results may not be optimal due to an inadequate volume of blood received in culture bottles   Culture   Final    NO GROWTH 5 DAYS Performed at [facility], [place] [address] [place], [place] [address]    Report Status 03/28/2021 FINAL  Final  Culture, blood (routine x 2)     Status: None   Collection Time: 03/23/21  5:44 PM   Specimen: BLOOD LEFT HAND  Result Value Ref Range Status   Specimen Description BLOOD LEFT HAND  Final   Special Requests   Final    BOTTLES DRAWN AEROBIC ONLY Blood Culture adequate volume    Culture   Final    NO GROWTH 5 DAYS Performed at [facility], [place] [address], [place], [place] [address]    Report Status 03/28/2021 FINAL  Final  Culture, blood (routine x 2)     Status: None   Collection Time: 03/24/21  9:07 AM   Specimen: BLOOD RIGHT HAND  Result Value Ref Range Status   Specimen Description BLOOD RIGHT HAND  Final   Special Requests   Final    AEROBIC BOTTLE ONLY Blood Culture results may not be optimal due to an inadequate volume of blood received in culture bottles   Culture   Final    NO GROWTH 5 DAYS Performed at [facility], [place] [address], [place], [place] [address]    Report Status 03/29/2021 FINAL  Final  Culture, blood (routine x 2)     Status: None   Collection Time: 03/24/21  9:17 AM   Specimen: BLOOD LEFT HAND  Result Value Ref Range Status   Specimen Description BLOOD LEFT HAND  Final   Special Requests   Final    BOTTLES DRAWN AEROBIC AND ANAEROBIC Blood Culture adequate volume   Culture   Final    NO GROWTH 5 DAYS Performed at [facility], [place] [address] [place], [place] [address]    Report Status 03/29/2021 FINAL  Final  Respiratory (~20 pathogens) panel by PCR     Status: None   Collection Time: 03/27/21 11:35 AM   Specimen: Nasopharyngeal Swab; Respiratory  Result Value Ref Range Status   Adenovirus NOT DETECTED NOT DETECTED Final   Coronavirus 229E NOT DETECTED NOT DETECTED Final    Comment: (NOTE) The Coronavirus on the Respiratory Panel, DOES NOT test for the novel  Coronavirus (2019 nCoV)    Coronavirus HKU1 NOT DETECTED NOT DETECTED Final   Coronavirus NL63 NOT DETECTED NOT DETECTED Final  Coronavirus OC43 NOT DETECTED NOT DETECTED Final   Metapneumovirus NOT DETECTED NOT DETECTED Final   Rhinovirus / Enterovirus NOT DETECTED NOT DETECTED Final   Influenza A NOT DETECTED NOT DETECTED Final   Influenza B NOT DETECTED NOT DETECTED Final   Parainfluenza Virus 1 NOT DETECTED NOT DETECTED Final    Parainfluenza Virus 2 NOT DETECTED NOT DETECTED Final   Parainfluenza Virus 3 NOT DETECTED NOT DETECTED Final   Parainfluenza Virus 4 NOT DETECTED NOT DETECTED Final   Respiratory Syncytial Virus NOT DETECTED NOT DETECTED Final   Bordetella pertussis NOT DETECTED NOT DETECTED Final   Bordetella Parapertussis NOT DETECTED NOT DETECTED Final   Chlamydophila pneumoniae NOT DETECTED NOT DETECTED Final   Mycoplasma pneumoniae NOT DETECTED NOT DETECTED Final    Comment: Performed at [facility], [place] [address], [place], [place] [address]      Radiology Studies: DG CHEST PORT 1 VIEW  Result Date: 03/28/2021 CLINICAL DATA:  Pneumonia. EXAM: PORTABLE CHEST 1 VIEW COMPARISON:  CT chest dated March 26, 2021. Chest x-ray dated March 25, 2021. FINDINGS: Unchanged left chest wall pacemaker and cardiomegaly. Patchy bilateral airspace disease in both lungs appears improved in the left upper lobe, but not significantly changed on the right. No pleural effusion or pneumothorax. No acute osseous abnormality. IMPRESSION: 1. Multifocal pneumonia, improved in the left upper lobe. Electronically Signed   By: Titus Dubin M.D.   On: 03/28/2021 08:40    Scheduled Meds:  amiodarone  200 mg Oral BID   apixaban  5 mg Oral BID   aspirin EC  81 mg Oral Daily   bisacodyl  10 mg Rectal Once   Chlorhexidine Gluconate Cloth  6 each Topical Daily   folic acid  1 mg Oral Daily   gabapentin  300 mg Oral TID   ipratropium  0.5 mg Nebulization TID   levalbuterol  0.63 mg Nebulization TID   mouth rinse  15 mL Mouth Rinse BID   nicotine  7 mg Transdermal Daily   polyethylene glycol  17 g Oral BID   predniSONE  40 mg Oral Q breakfast   senna-docusate  1 tablet Oral BID   sodium chloride flush  10-40 mL Intracatheter Q12H   sodium chloride  1 g Oral Q4H   Continuous Infusions:  sodium chloride Stopped (03/28/21 1756)    ceFAZolin (ANCEF) IV Stopped (03/29/21 0622)   diltiazem (CARDIZEM) infusion 15 mg/hr  (03/29/21 0854)     LOS: 7 days   Time spent: 39 minutes   Darliss Cheney, MD [facility]  03/29/2021, 12:46 PM  Please page via [place] [place] and do not message via secure chat for anything urgent. Secure chat can be used for anything non urgent.  How to contact the [facility] Attending or Consulting provider [place] or covering provider during after hours [place], for this patient?  Check the care team in [facility] and look for a) attending/consulting [facility] provider listed and b) the [facility] team listed. Page or secure chat [phone]. Log into [email or web address] and use [place]'s universal password to access. If you do not have the password, please contact the hospital operator. Locate the [facility] provider you are looking for under [facility] and page to a number that you can be directly reached. If you still have difficulty reaching the provider, please page the [facility] (Director on Call) for the Hospitalists listed on amion for assistance.

## 2021-03-29 NOTE — Discharge Instructions (Signed)

## 2021-03-29 NOTE — Progress Notes (Signed)
Mobility Specialist Progress Note:   03/29/21 0950  Mobility  Activity Ambulated in hall  Level of Assistance Standby assist, set-up cues, supervision of patient - no hands on  Assistive Device Front wheel walker  Distance Ambulated (ft) 230 ft  Mobility Ambulated with assistance in hallway  Mobility Response Tolerated well  Mobility performed by Mobility specialist  Bed Position Chair  $Mobility charge 1 Mobility   Pre- Mobility:  115 HR During Mobility: 130 HR Post Mobility:  105 HR  Pt received EOB willing to participate in mobility. Complaints of 5/10 foot pain. Pt required 3 standing rest breaks. Pt returned to chair with call bell in reach and all needs met.   [facility] Public librarian Phone [phone] Secondary Phone [phone]

## 2021-03-29 NOTE — Assessment & Plan Note (Signed)
I am comfortable with her taking her home supplement "nutraceutical "

## 2021-03-29 NOTE — Progress Notes (Addendum)
Progress Note  Patient Name: Sheila Morales Date of Encounter: 03/29/2021  [facility] Cardiologist: Elouise Munroe, MD   Subjective   Improved. Less SOB  Inpatient Medications    Scheduled Meds:  amiodarone  200 mg Oral BID   apixaban  5 mg Oral BID   aspirin EC  81 mg Oral Daily   bisacodyl  10 mg Rectal Once   Chlorhexidine Gluconate Cloth  6 each Topical Daily   folic acid  1 mg Oral Daily   gabapentin  300 mg Oral TID   ipratropium  0.5 mg Nebulization TID   levalbuterol  0.63 mg Nebulization TID   mouth rinse  15 mL Mouth Rinse BID   nicotine  7 mg Transdermal Daily   polyethylene glycol  17 g Oral BID   predniSONE  40 mg Oral Q breakfast   senna-docusate  1 tablet Oral BID   sodium chloride flush  10-40 mL Intracatheter Q12H   sodium chloride  1 g Oral Q4H   Continuous Infusions:  sodium chloride Stopped (03/28/21 1756)    ceFAZolin (ANCEF) IV Stopped (03/29/21 0622)   diltiazem (CARDIZEM) infusion 15 mg/hr (03/29/21 0854)   PRN Meds: sodium chloride, acetaminophen **OR** acetaminophen, adenosine (ADENOCARD) IV, albuterol, benzonatate, labetalol, lip balm, morphine injection, nitroGLYCERIN, ondansetron **OR** ondansetron (ZOFRAN) IV, oxyCODONE, sodium chloride flush   Vital Signs    Vitals:   03/29/21 0947 03/29/21 0952 03/29/21 0959 03/29/21 1200  BP:    (!) 116/40  Pulse:    73  Resp: (!) 29 (!) 23 19 19   Temp:    98 F (36.7 C)  TempSrc:    Oral  SpO2:    97%  Weight:      Height:        Intake/Output Summary (Last 24 hours) at 03/29/2021 1214 Last data filed at 03/29/2021 1000 Gross per 24 hour  Intake 781.68 ml  Output 1150 ml  Net -368.32 ml   Last 3 Weights 03/29/2021 03/28/2021 03/28/2021  Weight (lbs) 137 lb 4.8 oz 137 lb 3.2 oz 127 lb 13.9 oz  Weight (kg) 62.279 kg 62.234 kg 58 kg      Telemetry    Atrial fibrillation with variable response mostly in the 80s to 90s.  Excellent.  Occasional ventricular pacing spikes noted.-  Personally Reviewed  ECG    No new- Personally Reviewed  Physical Exam   GEN: No acute distress.   Neck: No JVD Cardiac: Irreg, no murmurs, rubs, or gallops.  Respiratory: Faint coarseness bilaterally GI: Soft, nontender, non-distended  MS: No edema; No deformity. Neuro:  Nonfocal  Psych: Normal affect   Labs    High Sensitivity Troponin:  No results for input(s): TROPONINIHS in the last 720 hours.   Chemistry Recent Labs  Lab 03/24/21 0011 03/24/21 0907 03/25/21 0123 03/26/21 0522 03/27/21 1245 03/28/21 0226 03/29/21 0328  NA 122* 125* 123*   < > 121* 127* 128*  K 3.3* 4.2 4.0   < > 4.0 4.3 5.0  CL 90* 91* 88*   < > 89* 94* 96*  CO2 21* 20* 20*   < > 20* 23 24  GLUCOSE 152* 124* 309*   < > 369* 154* 147*  BUN 28* 28* 29*   < > 29* 30* 28*  CREATININE 1.60* 1.75* 1.55*   < > 1.19* 1.12* 1.12*  CALCIUM 8.2* 8.6* 7.9*   < > 8.3* 8.8* 9.1  MG 2.2 2.1  --   --   --  1.8  --   PROT 7.0 6.6  --   --  5.8*  --   --   ALBUMIN 3.4* 3.6 2.9*  --  2.5*  --   --   AST 27 26  --   --  48*  --   --   ALT 20 18  --   --  34  --   --   ALKPHOS 90 84  --   --  74  --   --   BILITOT 0.3 0.6  --   --  0.6  --   --   GFRNONAA 34* 31* 36*   < > 49* 53* 53*  ANIONGAP 11 14 15    < > 12 10 8    < > = values in this interval not displayed.    Lipids No results for input(s): CHOL, TRIG, HDL, LABVLDL, LDLCALC, CHOLHDL in the last 168 hours.  Hematology Recent Labs  Lab 03/27/21 0213 03/28/21 0226 03/29/21 0328  WBC 22.2* 21.2* 21.6*  RBC 3.74* 3.38* 3.36*  HGB 11.6* 10.6* 10.4*  HCT 32.9* 29.7* 29.4*  MCV 88.0 87.9 87.5  MCH 31.0 31.4 31.0  MCHC 35.3 35.7 35.4  RDW 14.6 14.8 15.1  PLT 271 281 343   Thyroid  No results for input(s): TSH, FREET4 in the last 168 hours.   BNP Recent Labs  Lab 03/24/21 0011 03/28/21 0226  BNP 2,707.6* 680.4*    DDimer No results for input(s): DDIMER in the last 168 hours.   Radiology    DG CHEST PORT 1 VIEW  Result Date:  03/28/2021 CLINICAL DATA:  Pneumonia. EXAM: PORTABLE CHEST 1 VIEW COMPARISON:  CT chest dated March 26, 2021. Chest x-ray dated March 25, 2021. FINDINGS: Unchanged left chest wall pacemaker and cardiomegaly. Patchy bilateral airspace disease in both lungs appears improved in the left upper lobe, but not significantly changed on the right. No pleural effusion or pneumothorax. No acute osseous abnormality. IMPRESSION: 1. Multifocal pneumonia, improved in the left upper lobe. Electronically Signed   By: Titus Dubin M.D.   On: 03/28/2021 08:40    Cardiac Studies   ECHO 03/22/21:   1. Left ventricular ejection fraction, by estimation, is 70 to 75%. The  left ventricle has hyperdynamic function. The left ventricle has no  regional wall motion abnormalities. There is mild left ventricular  hypertrophy. Left ventricular diastolic  parameters are consistent with Grade II diastolic dysfunction  (pseudonormalization). Elevated left atrial pressure.   2. Right ventricular systolic function is normal. The right ventricular  size is normal. There is mildly elevated pulmonary artery systolic  pressure.   3. Left atrial size was mildly dilated.   4. The mitral valve is normal in structure. Mild mitral valve  regurgitation. No evidence of mitral stenosis.   5. The aortic valve is tricuspid. Aortic valve regurgitation is not  visualized. Aortic valve sclerosis/calcification is present, without any  evidence of aortic stenosis.   6. The inferior vena cava is normal in size with greater than 50%  respiratory variability, suggesting right atrial pressure of 3 mmHg.   Comparison(s): No significant change from prior study.   Patient Profile     70 y.o. female with paroxysmal atrial fibrillation in the setting of respiratory failure COPD, pacemaker in place with chronic diastolic heart failure.  Assessment & Plan    Acute on chronic diastolic heart failure (HCC) Ejection fraction 70 to 75%  hyperdynamic with grade 2 diastolic dysfunction.  Volume status looks excellent  currently.  No changes made.  Acute metabolic encephalopathy Continues to improve.  History of rheumatic fever as a child Echocardiogram personally reviewed-no evidence of rheumatic changes on her heart valves.  She does have some mild aortic sclerosis.  No overall significant valvular abnormality such as mitral stenosis.  Pacemaker Functioning well.  Occasional pacer spikes.  Remember, we have backup for our medications used to treat her tachycardia.  Persistent atrial fibrillation ([place]) Has been in and out of atrial fibrillation during this hospitalization.  Currently over the past 4 days she has been more persistent.  Well rate controlled.  Continue with amiodarone 400 mg p.o. twice daily at that point with taper to 200 mg p.o. twice a day for a week after that and then 200 mg a day thereafter.  Also on diltiazem IV.  Okay to increase to 15.  Hopefully tomorrow we can switch to p.o. as well.  There are no urgent needs for cardioversion at this time.  She is hemodynamically stable.  After anticoagulation straight for a total of 3 weeks, we could cardiovert her successfully as an outpatient electively if necessary without TEE.  Chronic anticoagulation Would be comfortable transitioning over to Eliquis 5 mg twice a day from IV heparin.  Discussed with primary team.  Irritable bowel syndrome I am comfortable with her taking her home supplement "nutraceutical "    For questions or updates, please contact [place] Please consult [email or web address] for contact info under        Signed, Candee Furbish, MD  03/29/2021, 12:14 PM

## 2021-03-29 NOTE — Care Management Important Message (Signed)
Important Message  Patient Details  Name: ELLICE BOULTINGHOUSE MRN: [id number] Date of Birth: 1950/07/30   Medicare Important Message Given:  Yes     Shelda Altes 03/29/2021, 8:55 AM

## 2021-03-30 ENCOUNTER — Inpatient Hospital Stay (HOSPITAL_COMMUNITY): Payer: PPO

## 2021-03-30 LAB — BASIC METABOLIC PANEL
Anion gap: 8 (ref 5–15)
BUN: 25 mg/dL — ABNORMAL HIGH (ref 8–23)
CO2: 24 mmol/L (ref 22–32)
Calcium: 9 mg/dL (ref 8.9–10.3)
Chloride: 96 mmol/L — ABNORMAL LOW (ref 98–111)
Creatinine, Ser: 0.96 mg/dL (ref 0.44–1.00)
GFR, Estimated: >60 mL/min (ref >60)
Glucose, Bld: 115 mg/dL — ABNORMAL HIGH (ref 70–99)
Potassium: 4.7 mmol/L (ref 3.5–5.1)
Sodium: 128 mmol/L — ABNORMAL LOW (ref 135–145)

## 2021-03-30 MED ORDER — DILTIAZEM HCL ER COATED BEADS 180 MG PO CP24
360.0000 mg | ORAL_CAPSULE | Freq: Every day | ORAL | Status: DC
  Administered 2021-03-30 – 2021-04-01 (×3): 360 mg via ORAL
  Filled 2021-03-30 (×3): qty 2

## 2021-03-30 MED ORDER — LEVALBUTEROL HCL 0.63 MG/3ML IN NEBU: 0.6300 mg | INHALATION_SOLUTION | Freq: Four times a day (QID) | RESPIRATORY_TRACT | Status: DC | PRN

## 2021-03-30 NOTE — Progress Notes (Addendum)
PROGRESS NOTE    Sheila Morales  BMW:[id number] DOB: 1951/02/05 DOA: 03/21/2021 PCP: Hoyt Koch, MD   Brief Narrative:  Sheila Morales is a 70 y.o. with a past medical history significant for COPD, HFpEF, systemic lupus erythematosus, and ischemic colitis who presented to the emergency department 11/30 for complaints of dyspnea, fatigue, weakness, and altered mental status.  Per husband symptoms began 4 days prior to admission with a worrisome symptom of cough and malaise.  States he attempted to seek medical treatment for patient at local ER/urgent care but t when the wait time exceeded 11 hours they decided to leave and not be seen.  Admitted to [facility] with diastolic HF exacerbation, AKI, and hyponatremia.  Found to have e.coli bacteremia.   Morning of 12/3 patent has again developed worsening mentation with A-fib RVR with recurrent low-grade fever, tachycardia, and hypertension.  PCCM consulted for further management and transfer to ICU  Significant Hospital Events: Including procedures, antibiotic start and stop dates in addition to other pertinent events   11/30 admitted for generalized fatigue, weakness, and altered mental status found to be bacteremic with E. Coli and UTI.  Patient was also with several metabolic derangements including severely hyponatremic with a sodium of 117 12/1 Nephrology and Cardiology consulted for AKI/ hyponatremia and and HF exacerbation/ AF w/ RVR 12/2 ID consult 12/3 transfer to ICU for acute encephalopathy and AF/RVR 12/4 Came off BiPAP this morning. Napping. Still lethargic. No abdominal pain 12/6 Off BiPAP this am, some abdominal muscle use noted, on 4 L HFNC with sats of 92% 12/8: Transferred to [facility]   11/30 SARS/ flu > neg 11/30 Bcx2 > pansensitive e.coli 12/1 MRSA PCR > neg 12/1 UC  > neg 12/2 Bcx 2 > ngtd 12/3 Bcx 2 > ngtd   11/30 cefepime 11/30 flagyl 11/30 vanc > 12/1 12/1 ceftriaxone >    Assessment & Plan:   Principal Problem:    Acute on chronic diastolic heart failure (HCC) Active Problems:   Tobacco use   Irritable bowel syndrome   History of rheumatic fever as a child   COPD (chronic obstructive pulmonary disease) (HCC)   Hyponatremia   AKI (acute kidney injury) ([place])   Hypertension   Pacemaker   Pulmonary vascular congestion   Acute metabolic encephalopathy   Persistent atrial fibrillation (HCC)   Chronic anticoagulation  Acute Hypoxic and hypercapnic Respiratory Failure: secondary to possible CAP versus pulmonary edema.  She is daily smoker.  Chest CT on admission negative for signs of acute pneumonia revealed mild emphysema with questionable mild degree of fibrosis and cardiomegaly, hx of SLE Subsequent CT Chest 12/5>> Extensive upper lobe GG pulmonary infiltrate, suspect infectious.  She required BiPAP in the ICU.  Currently she is on 3 L and saturating 94%. continue with xopenex/ atrovent q6hr with prn albuterol.  On cefazolin, ending today. 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 urine legionella negative. cont v Prednisone 40 mg daily with slow wean ( Fibrotic changes per CT Chest).  Did have some rhonchi and crackles at the bases bilaterally on my exam today, obtaining chest x-ray.  Concerned about possible pulmonary edema.   E. Coli bacteremia secondary to pyelonephritis: CT Abdomen pelvis with moderate asymmetric right perinephric Stranding, concerning for pyelonephritis. Felt secondary to UTI in immunocompromised patient although UC neg (but was after abx) P: ID following, neg TTE 12/1, continue cefazolin with end date 03/30/2021 per ID recommendations.   Acute  on chronic diastolic heart failure: ECHO 12/1 with EF 70-75 and grade II diastolic dysfunction.  Some crackles on exam, obtaining chest x-ray.  HTN: Controlled  New onset A. fib RVR: Likely secondary to acute illness.  Patient was transition from IV amiodarone to oral amiodarone 03/28/2021, on diltiazem drip this  morning but rate still around 120, now transition to oral diltiazem long-acting by cardiology.  Continue Eliquis.  History of sick sinus syndrome s/p permanent pacemaker: Cardiology following, appreciate assistance   Acute metabolic encephalopathy: Resolved.  She is fully alert and oriented.   Acute Kidney Injury, non-oliguric.  Creatinine peaked at 2.11.  Now resolved.  Baseline creatinine 0.7-0.9.  -Acute on chronic hyponatremia: Lowest was 113 during this hospitalization, currently 128.  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  TSH normal.  They recommended continuing fluid restriction and added salt tablets.   Hx of SLE -Home medications include Plaquenil and Methotrexate  Medication remains on hold Supportive care   Normocytic anemia: Hemoglobin is stable.  CT  Abdomen pelvis with 5.1 cm indeterminate cystic lesion within the left adnexa.  This was not visible on pelvic ultrasound.  However there is small amount of fluid in endometrial canal with suspected 7 mm endometrial nodule and tissue sampling is recommended as outpatient with gynecology.   Peripheral Neuropathy  - on home gabapentin ?900mg  in am and 1200mg  at HS, last dose 12/1 -cont low dose gabapentin 12/6 , and avoid morphine/ oxy IR.   DVT prophylaxis: SCDs Start: 03/22/21 3875   Code Status: Full Code  Family Communication: Husband present at bedside.  Plan of care discussed with patient in length and he verbalized understanding and agreed with it.  Status is: Inpatient  Remains inpatient appropriate because: Needs management of A. fib with RVR.  Estimated body mass index is 25.12 kg/m as calculated from the following:   Height as of this encounter: 5\' 2"  (1.575 m).   Weight as of this encounter: 62.3 kg.   Nutritional Assessment: Body mass index is 25.12 kg/m.Marland Kitchen Seen by dietician.  I agree with the assessment and plan as outlined below: Nutrition  Status:   Skin Assessment: I have examined the patient's skin and I agree with the wound assessment as performed by the wound care RN as outlined below:    Consultants:  Cardiology Nephrology-signed off ID-signed off  Procedures:  None  Antimicrobials:  Anti-infectives (From admission, onward)    Start     Dose/Rate Route Frequency Ordered Stop   03/27/21 1500  ceFAZolin (ANCEF) IVPB 2g/100 mL premix        2 g 200 mL/hr over 30 Minutes Intravenous Every 8 hours 03/27/21 1425 03/30/21 2359   03/26/21 1800  ceFAZolin (ANCEF) IVPB 2g/100 mL premix  Status:  Discontinued        2 g 200 mL/hr over 30 Minutes Intravenous Every 12 hours 03/26/21 1046 03/27/21 1425   03/22/21 2000  ceFEPIme (MAXIPIME) 2 g in sodium chloride 0.9 % 100 mL IVPB  Status:  Discontinued        2 g 200 mL/hr over 30 Minutes Intravenous Every 24 hours 03/21/21 2209 03/22/21 1456   03/22/21 1800  cefTRIAXone (ROCEPHIN) 2 g in sodium chloride 0.9 % 100 mL IVPB  Status:  Discontinued        2 g 200 mL/hr over 30 Minutes Intravenous Every 24 hours 03/22/21 1456 03/26/21 1046   03/22/21 1600  vancomycin (VANCOREADY) IVPB 750  mg/150 mL  Status:  Discontinued        750 mg 150 mL/hr over 60 Minutes Intravenous Every 24 hours 03/22/21 1419 03/23/21 1018   03/21/21 2209  vancomycin variable dose per unstable renal function (pharmacist dosing)  Status:  Discontinued         Does not apply See admin instructions 03/21/21 2209 03/22/21 1425   03/21/21 1930  ceFEPIme (MAXIPIME) 2 g in sodium chloride 0.9 % 100 mL IVPB        2 g 200 mL/hr over 30 Minutes Intravenous  Once 03/21/21 1916 03/21/21 2203   03/21/21 1930  metroNIDAZOLE (FLAGYL) IVPB 500 mg        500 mg 100 mL/hr over 60 Minutes Intravenous  Once 03/21/21 1916 03/21/21 2101   03/21/21 1930  vancomycin (VANCOCIN) IVPB 1000 mg/200 mL premix        1,000 mg 200 mL/hr over 60 Minutes Intravenous  Once 03/21/21 1916 03/21/21 2253           Subjective:  Seen and examined.  Husband at bedside.  She states that she is feeling better, no shortness of breath or chest pain or palpitation.  She is very eager to go home.  She understands that her heart rate is very high and she is not stable yet for discharge.  Objective: Vitals:   03/30/21 0717 03/30/21 0721 03/30/21 0722 03/30/21 0827  BP: 137/79     Pulse:  91    Resp:   20   Temp: 97.9 F (36.6 C)     TempSrc: Oral     SpO2:    97%  Weight:      Height:        Intake/Output Summary (Last 24 hours) at 03/30/2021 1317 Last data filed at 03/30/2021 1316 Gross per 24 hour  Intake 460 ml  Output 2050 ml  Net -1590 ml    Filed Weights   03/29/21 0039 03/29/21 0500 03/30/21 0519  Weight: 62.3 kg 62.3 kg 62.3 kg    Examination:  General exam: Appears calm and comfortable  Respiratory system: Bibasilar crackles and rhonchi. Respiratory effort normal. Cardiovascular system: S1 & S2 heard, irregularly irregular rate and rhythm. No JVD, murmurs, rubs, gallops or clicks. No pedal edema. Gastrointestinal system: Abdomen is nondistended, soft and nontender. No organomegaly or masses felt. Normal bowel sounds heard. Central nervous system: Alert and oriented. No focal neurological deficits. Extremities: Symmetric 5 x 5 power. Skin: No rashes, lesions or ulcers.  Psychiatry: Judgement and insight appear normal. Mood & affect appropriate.    Data Reviewed: I have personally reviewed following labs and imaging studies  CBC: Recent Labs  Lab 03/24/21 0011 03/24/21 0907 03/25/21 0123 03/26/21 0522 03/27/21 0213 03/28/21 0226 03/29/21 0328  WBC 15.5* 15.1* 16.9* 19.7* 22.2* 21.2* 21.6*  NEUTROABS 12.0* 11.4*  --   --   --  18.8*  --   HGB 10.8* 10.7* 10.8* 10.8* 11.6* 10.6* 10.4*  HCT 29.3* 29.6* 29.3* 29.7* 32.9* 29.7* 29.4*  MCV 87.5 87.8 88.3 88.1 88.0 87.9 87.5  PLT 167 168 162 221 271 281 856    Basic Metabolic Panel: Recent Labs  Lab 03/24/21 0011  03/24/21 0907 03/25/21 0123 03/26/21 0522 03/27/21 0213 03/27/21 1245 03/28/21 0226 03/29/21 0328 03/30/21 0217  NA 122* 125* 123*   < > 126* 121* 127* 128* 128*  K 3.3* 4.2 4.0   < > 4.7 4.0 4.3 5.0 4.7  CL 90* 91* 88*   < >  95* 89* 94* 96* 96*  CO2 21* 20* 20*   < > 19* 20* 23 24 24   GLUCOSE 152* 124* 309*   < > 125* 369* 154* 147* 115*  BUN 28* 28* 29*   < > 34* 29* 30* 28* 25*  CREATININE 1.60* 1.75* 1.55*   < > 1.30* 1.19* 1.12* 1.12* 0.96  CALCIUM 8.2* 8.6* 7.9*   < > 8.9 8.3* 8.8* 9.1 9.0  MG 2.2 2.1  --   --   --   --  1.8  --   --   PHOS 2.9 3.2 3.0  --   --   --   --   --   --    < > = values in this interval not displayed.    GFR: Estimated Creatinine Clearance: 46.7 mL/min (by C-G formula based on SCr of 0.96 mg/dL). Liver Function Tests: Recent Labs  Lab 03/24/21 0011 03/24/21 0907 03/25/21 0123 03/27/21 1245  AST 27 26  --  48*  ALT 20 18  --  34  ALKPHOS 90 84  --  74  BILITOT 0.3 0.6  --  0.6  PROT 7.0 6.6  --  5.8*  ALBUMIN 3.4* 3.6 2.9* 2.5*    No results for input(s): LIPASE, AMYLASE in the last 168 hours. No results for input(s): AMMONIA in the last 168 hours. Coagulation Profile: No results for input(s): INR, PROTIME in the last 168 hours. Cardiac Enzymes: No results for input(s): CKTOTAL, CKMB, CKMBINDEX, TROPONINI in the last 168 hours. BNP (last 3 results) Recent Labs    03/21/21 1206  PROBNP 1,279.0*    HbA1C: No results for input(s): HGBA1C in the last 72 hours. CBG: Recent Labs  Lab 03/25/21 1946 03/25/21 2311 03/26/21 0416 03/27/21 1149 03/28/21 0735  GLUCAP 145* 147* 123* 106* 146*    Lipid Profile: No results for input(s): CHOL, HDL, LDLCALC, TRIG, CHOLHDL, LDLDIRECT in the last 72 hours. Thyroid Function Tests: No results for input(s): TSH, T4TOTAL, FREET4, T3FREE, THYROIDAB in the last 72 hours. Anemia Panel: No results for input(s): VITAMINB12, FOLATE, FERRITIN, TIBC, IRON, RETICCTPCT in the last 72 hours. Sepsis  Labs: Recent Labs  Lab 03/24/21 0906 03/24/21 1523  LATICACIDVEN 1.0 1.0     Recent Results (from the past 240 hour(s))  Resp Panel by RT-PCR (Flu A&B, Covid) Nasopharyngeal Swab     Status: None   Collection Time: 03/21/21  7:15 PM   Specimen: Nasopharyngeal Swab; Nasopharyngeal(NP) swabs in vial transport medium  Result Value Ref Range Status   SARS Coronavirus 2 by RT PCR NEGATIVE NEGATIVE Final    Comment: (NOTE) SARS-CoV-2 target nucleic acids are NOT DETECTED.  The SARS-CoV-2 RNA is generally detectable in upper respiratory specimens during the acute phase of infection. The lowest concentration of SARS-CoV-2 viral copies this assay can detect is 138 copies/mL. A negative result does not preclude SARS-Cov-2 infection and should not be used as the sole basis for treatment or other patient management decisions. A negative result may occur with  improper specimen collection/handling, submission of specimen other than nasopharyngeal swab, presence of viral mutation(s) within the areas targeted by this assay, and inadequate number of viral copies(<138 copies/mL). A negative result must be combined with clinical observations, patient history, and epidemiological information. The expected result is Negative.  Fact Sheet for Patients:  EntrepreneurPulse.com.au  Fact Sheet for Healthcare Providers:  IncredibleEmployment.be  This test is no t yet approved or cleared by the [place] FDA and  has been  authorized for detection and/or diagnosis of SARS-CoV-2 by FDA under an Emergency Use Authorization (EUA). This EUA will remain  in effect (meaning this test can be used) for the duration of the COVID-19 declaration under Section 564(b)(1) of the Act, 21 U.S.C.section 360bbb-3(b)(1), unless the authorization is terminated  or revoked sooner.       Influenza A by PCR NEGATIVE NEGATIVE Final   Influenza B by PCR NEGATIVE NEGATIVE Final     Comment: (NOTE) The Xpert Xpress SARS-CoV-2/FLU/RSV plus assay is intended as an aid in the diagnosis of influenza from Nasopharyngeal swab specimens and should not be used as a sole basis for treatment. Nasal washings and aspirates are unacceptable for Xpert Xpress SARS-CoV-2/FLU/RSV testing.  Fact Sheet for Patients: EntrepreneurPulse.com.au  Fact Sheet for Healthcare Providers: IncredibleEmployment.be  This test is not yet approved or cleared by the [place] FDA and has been authorized for detection and/or diagnosis of SARS-CoV-2 by FDA under an Emergency Use Authorization (EUA). This EUA will remain in effect (meaning this test can be used) for the duration of the COVID-19 declaration under Section 564(b)(1) of the Act, 21 U.S.C. section 360bbb-3(b)(1), unless the authorization is terminated or revoked.  Performed at [facility], [address] [place], [place] [address]   Blood Culture (routine x 2)     Status: Abnormal   Collection Time: 03/21/21  7:15 PM   Specimen: Left Antecubital; Blood  Result Value Ref Range Status   Specimen Description   Final    LEFT ANTECUBITAL Performed at [facility], [address] [place], [place] [address]    Special Requests   Final    BOTTLES DRAWN AEROBIC AND ANAEROBIC Blood Culture adequate volume Performed at [facility], [address] [place], [place] [address]    Culture  Setup Time   Final    GRAM NEGATIVE RODS IN BOTH AEROBIC AND ANAEROBIC BOTTLES CRITICAL RESULT CALLED TO, READ BACK BY AND VERIFIED WITH: Andres Shad PHARMD 1407 03/22/21 A BROWNING    Culture (A)  Final    ESCHERICHIA COLI SUSCEPTIBILITIES PERFORMED ON PREVIOUS CULTURE WITHIN THE LAST 5 DAYS. Performed at [facility], [place] [address], [place], [place] [address]    Report Status 03/24/2021 FINAL  Final  Blood Culture (routine x 2)     Status: Abnormal   Collection Time:  03/21/21  7:15 PM   Specimen: Right Antecubital; Blood  Result Value Ref Range Status   Specimen Description   Final    RIGHT ANTECUBITAL Performed at [facility], [address] [place], [place] [address]    Special Requests   Final    BOTTLES DRAWN AEROBIC AND ANAEROBIC Blood Culture adequate volume Performed at [facility], [address] [place], [place] [address]    Culture  Setup Time   Final    GRAM NEGATIVE RODS IN BOTH AEROBIC AND ANAEROBIC BOTTLES CRITICAL RESULT CALLED TO, READ BACK BY AND VERIFIED WITHAndres Shad PHARMD [id number] 03/22/21 A BROWNING Performed at [facility], [place] [address] [place],  [address]    Culture ESCHERICHIA COLI (A)  Final   Report Status 03/24/2021 FINAL  Final   Organism ID, Bacteria ESCHERICHIA COLI  Final      Susceptibility   Escherichia coli - MIC*    AMPICILLIN 4 SENSITIVE Sensitive     CEFAZOLIN <=4 SENSITIVE Sensitive     CEFEPIME <=0.12 SENSITIVE  Sensitive     CEFTAZIDIME <=1 SENSITIVE Sensitive     CEFTRIAXONE <=0.25 SENSITIVE Sensitive     CIPROFLOXACIN <=0.25 SENSITIVE Sensitive     GENTAMICIN <=1 SENSITIVE Sensitive     IMIPENEM <=0.25 SENSITIVE Sensitive     TRIMETH/SULFA <=20 SENSITIVE Sensitive     AMPICILLIN/SULBACTAM <=2 SENSITIVE Sensitive     PIP/TAZO <=4 SENSITIVE Sensitive     * ESCHERICHIA COLI  Blood Culture ID Panel (Reflexed)     Status: Abnormal   Collection Time: 03/21/21  7:15 PM  Result Value Ref Range Status   Enterococcus faecalis NOT DETECTED NOT DETECTED Final   Enterococcus Faecium NOT DETECTED NOT DETECTED Final   Listeria monocytogenes NOT DETECTED NOT DETECTED Final   Staphylococcus species NOT DETECTED NOT DETECTED Final   Staphylococcus aureus (BCID) NOT DETECTED NOT DETECTED Final   Staphylococcus epidermidis NOT DETECTED NOT DETECTED Final   Staphylococcus lugdunensis NOT DETECTED NOT DETECTED Final   Streptococcus species NOT DETECTED NOT DETECTED Final    Streptococcus agalactiae NOT DETECTED NOT DETECTED Final   Streptococcus pneumoniae NOT DETECTED NOT DETECTED Final   Streptococcus pyogenes NOT DETECTED NOT DETECTED Final   A.calcoaceticus-baumannii NOT DETECTED NOT DETECTED Final   Bacteroides fragilis NOT DETECTED NOT DETECTED Final   Enterobacterales DETECTED (A) NOT DETECTED Final    Comment: Enterobacterales represent a large order of gram negative bacteria, not a single organism. CRITICAL RESULT CALLED TO, READ BACK BY AND VERIFIED WITH: Andres Shad PHARMD 1407 03/22/21 A BROWNING    Enterobacter cloacae complex NOT DETECTED NOT DETECTED Final   Escherichia coli DETECTED (A) NOT DETECTED Final    Comment: CRITICAL RESULT CALLED TO, READ BACK BY AND VERIFIED WITH: Andres Shad PHARMD 1407 03/22/21 A BROWNING    Klebsiella aerogenes NOT DETECTED NOT DETECTED Final   Klebsiella oxytoca NOT DETECTED NOT DETECTED Final   Klebsiella pneumoniae NOT DETECTED NOT DETECTED Final   Proteus species NOT DETECTED NOT DETECTED Final   Salmonella species NOT DETECTED NOT DETECTED Final   Serratia marcescens NOT DETECTED NOT DETECTED Final   Haemophilus influenzae NOT DETECTED NOT DETECTED Final   Neisseria meningitidis NOT DETECTED NOT DETECTED Final   Pseudomonas aeruginosa NOT DETECTED NOT DETECTED Final   Stenotrophomonas maltophilia NOT DETECTED NOT DETECTED Final   Candida albicans NOT DETECTED NOT DETECTED Final   Candida auris NOT DETECTED NOT DETECTED Final   Candida glabrata NOT DETECTED NOT DETECTED Final   Candida krusei NOT DETECTED NOT DETECTED Final   Candida parapsilosis NOT DETECTED NOT DETECTED Final   Candida tropicalis NOT DETECTED NOT DETECTED Final   Cryptococcus neoformans/gattii NOT DETECTED NOT DETECTED Final   CTX-M ESBL NOT DETECTED NOT DETECTED Final   Carbapenem resistance IMP NOT DETECTED NOT DETECTED Final   Carbapenem resistance KPC NOT DETECTED NOT DETECTED Final   Carbapenem resistance NDM NOT DETECTED NOT DETECTED  Final   Carbapenem resist OXA 48 LIKE NOT DETECTED NOT DETECTED Final   Carbapenem resistance VIM NOT DETECTED NOT DETECTED Final    Comment: Performed at [facility], [address], [place], [place] [address]  Urine Culture     Status: None   Collection Time: 03/22/21  2:07 PM   Specimen: In/Out Cath Urine  Result Value Ref Range Status   Specimen Description IN/OUT CATH URINE  Final   Special Requests NONE  Final   Culture   Final    NO GROWTH Performed at [facility], [place] [address] [place], [place]  [address]    Report Status 03/23/2021 FINAL  Final  MRSA Next Gen by PCR, Nasal     Status: None   Collection Time: 03/22/21  3:00 PM  Result Value Ref Range Status   MRSA by PCR Next Gen NOT DETECTED NOT DETECTED Final    Comment: (NOTE) The GeneXpert MRSA Assay (FDA approved for NASAL specimens only), is one component of a comprehensive MRSA colonization surveillance program. It is not intended to diagnose MRSA infection nor to guide or monitor treatment for MRSA infections. Test performance is not FDA approved in patients less than 107 years old. Performed at [facility], [place] [address] [place], [place] [address]   Culture, blood (routine x 2)     Status: None   Collection Time: 03/23/21  5:44 PM   Specimen: BLOOD  Result Value Ref Range Status   Specimen Description BLOOD LEFT ANTECUBITAL  Final   Special Requests   Final    BOTTLES DRAWN AEROBIC AND ANAEROBIC Blood Culture results may not be optimal due to an inadequate volume of blood received in culture bottles   Culture   Final    NO GROWTH 5 DAYS Performed at [facility], [place] [address], [place], [place] [address]    Report Status 03/28/2021 FINAL  Final  Culture, blood (routine x 2)     Status: None   Collection Time: 03/23/21  5:44 PM   Specimen: BLOOD LEFT HAND  Result Value Ref Range Status   Specimen Description BLOOD LEFT HAND  Final   Special Requests   Final    BOTTLES DRAWN  AEROBIC ONLY Blood Culture adequate volume   Culture   Final    NO GROWTH 5 DAYS Performed at [facility], [place] [address], [place], [place] [address]    Report Status 03/28/2021 FINAL  Final  Culture, blood (routine x 2)     Status: None   Collection Time: 03/24/21  9:07 AM   Specimen: BLOOD RIGHT HAND  Result Value Ref Range Status   Specimen Description BLOOD RIGHT HAND  Final   Special Requests   Final    AEROBIC BOTTLE ONLY Blood Culture results may not be optimal due to an inadequate volume of blood received in culture bottles   Culture   Final    NO GROWTH 5 DAYS Performed at [facility], [place] [address], [place], [place] [address]    Report Status 03/29/2021 FINAL  Final  Culture, blood (routine x 2)     Status: None   Collection Time: 03/24/21  9:17 AM   Specimen: BLOOD LEFT HAND  Result Value Ref Range Status   Specimen Description BLOOD LEFT HAND  Final   Special Requests   Final    BOTTLES DRAWN AEROBIC AND ANAEROBIC Blood Culture adequate volume   Culture   Final    NO GROWTH 5 DAYS Performed at [facility], [place] [address], [place],  [address]    Report Status 03/29/2021 FINAL  Final  Respiratory (~20 pathogens) panel by PCR     Status: None   Collection Time: 03/27/21 11:35 AM   Specimen: Nasopharyngeal Swab; Respiratory  Result Value Ref Range Status   Adenovirus NOT DETECTED NOT DETECTED Final   Coronavirus 229E NOT DETECTED NOT DETECTED Final    Comment: (NOTE) The Coronavirus on the Respiratory Panel, DOES NOT test for the novel  Coronavirus (2019 nCoV)    Coronavirus HKU1 NOT DETECTED NOT  DETECTED Final   Coronavirus NL63 NOT DETECTED NOT DETECTED Final   Coronavirus OC43 NOT DETECTED NOT DETECTED Final   Metapneumovirus NOT DETECTED NOT DETECTED Final   Rhinovirus / Enterovirus NOT DETECTED NOT DETECTED Final   Influenza A NOT DETECTED NOT DETECTED Final   Influenza B NOT DETECTED NOT DETECTED Final   Parainfluenza Virus 1  NOT DETECTED NOT DETECTED Final   Parainfluenza Virus 2 NOT DETECTED NOT DETECTED Final   Parainfluenza Virus 3 NOT DETECTED NOT DETECTED Final   Parainfluenza Virus 4 NOT DETECTED NOT DETECTED Final   Respiratory Syncytial Virus NOT DETECTED NOT DETECTED Final   Bordetella pertussis NOT DETECTED NOT DETECTED Final   Bordetella Parapertussis NOT DETECTED NOT DETECTED Final   Chlamydophila pneumoniae NOT DETECTED NOT DETECTED Final   Mycoplasma pneumoniae NOT DETECTED NOT DETECTED Final    Comment: Performed at [facility], [place] [address] [place], [place] [address]       Radiology Studies: US PELVIC COMPLETE WITH TRANSVAGINAL  Result Date: 03/29/2021 CLINICAL DATA:  Pelvic mass EXAM: TRANSABDOMINAL AND TRANSVAGINAL ULTRASOUND OF PELVIS TECHNIQUE: Both transabdominal and transvaginal ultrasound examinations of the pelvis were performed. Transabdominal technique was performed for global imaging of the pelvis including uterus, ovaries, adnexal regions, and pelvic cul-de-sac. It was necessary to proceed with endovaginal exam following the transabdominal exam to visualize the uterus endometrium ovaries. COMPARISON:  CT 03/26/2021 FINDINGS: Uterus Measurements: 6.6 x 3.5 x 3.7 cm = volume: 44.9 mL. No fibroids or other mass visualized. Endometrium Thickness: 3.5 mm. Small amount of fluid in the endometrial canal. Hypoechoic nodule measuring 7 by 3 x 4 mm. Right ovary Not visualized.  Dilated pelvic vessels. Left ovary Meaurements: 2.9 x 1.4 x 1.1 cm Volume:2.4 mL. No adnexal mass is seen. There is no cystic structure visible in the left adnexa. There are dilated pelvic vessels. Other findings None IMPRESSION: 1. The described cystic lesion at the left adnexa is not seen by sonography. 2. Small amount of fluid in the endometrial canal with suspected 7 mm endometrial nodule. Gynecology consultation with possible tissue sampling is recommended. 3. Dilated left greater than right pelvic vessels.  Electronically Signed   By: Donavan Foil M.D.   On: 03/29/2021 16:09    Scheduled Meds:  amiodarone  200 mg Oral BID   apixaban  5 mg Oral BID   aspirin EC  81 mg Oral Daily   bisacodyl  10 mg Rectal Once   diltiazem  360 mg Oral Daily   folic acid  1 mg Oral Daily   gabapentin  300 mg Oral TID   mouth rinse  15 mL Mouth Rinse BID   nicotine  7 mg Transdermal Daily   polyethylene glycol  17 g Oral BID   predniSONE  40 mg Oral Q breakfast   senna-docusate  1 tablet Oral BID   sodium chloride flush  10-40 mL Intracatheter Q12H   sodium chloride  1 g Oral Q4H   Continuous Infusions:  sodium chloride Stopped (03/28/21 1756)    ceFAZolin (ANCEF) IV 2 g (03/30/21 0532)     LOS: 8 days   Time spent: 30 minutes   Darliss Cheney, MD [facility]  03/30/2021, 1:17 PM  Please page via [facility] and do not message via secure chat for anything urgent. Secure chat can be used for anything non urgent.  How to contact the [facility] Attending or Consulting provider [place] or covering provider during after hours [place], for this  patient?  Check the care team in [facility] and look for a) attending/consulting TR[facility] provider listed and b) the [facility] team listed. Page or secure chat [phone]. Log into [email or web address] and use [place]'s universal password to access. If you do not have the password, please contact the hospital operator. Locate the [facility] provider you are looking for under [facility] and page to a number that you can be directly reached. If you still have difficulty reaching the provider, please page the [facility] (Director on Call) for the Hospitalists listed on amion for assistance.

## 2021-03-30 NOTE — TOC Progression Note (Signed)
Transition of Care [facility]) - Progression Note    Patient Details  Name: Sheila Morales MRN: [id number] Date of Birth: 1951/03/01  [facility] CM/SW Contact  Zenon Mayo, RN Phone Number: 03/30/2021, 7:12 PM  Clinical Narrative:    afib, on cardizem drip, on 2 liters oxygen, on eliquis, conts on iv abx, ID following.  Per pt eval rec HHPT, HHOT , and rolling walker, will need  set up .TOC team will continue to follow for dc needs.     Barriers to Discharge: Continued Medical Work up  Expected Discharge Plan and Services     Discharge Planning Services: CM Consult   Living arrangements for the past 2 months: Single Family Home                                       Social Determinants of Health (SDOH) Interventions    Readmission Risk Interventions No flowsheet data found.

## 2021-03-30 NOTE — Progress Notes (Signed)
Mobility Specialist Progress Note:   03/30/21 1021  Mobility  Activity Ambulated in hall  Level of Assistance Standby assist, set-up cues, supervision of patient - no hands on  Assistive Device Front wheel walker  Distance Ambulated (ft) 280 ft  Mobility Ambulated with assistance in hallway  Mobility Response Tolerated well  Mobility performed by Mobility specialist  Bed Position Chair  $Mobility charge 1 Mobility   Pt received in chair willing to participate in mobility. No complaints of pain. Required 3 standing rest breaks. Pt returned to chair with call bell in reach, all needs met and husband present.   [facility] Public librarian Phone [phone] Secondary Phone [phone]

## 2021-03-30 NOTE — Progress Notes (Signed)
Physical Therapy Treatment Patient Details Name: Sheila Morales MRN: [id number] DOB: 01-08-1951 Today's Date: 03/30/2021   History of Present Illness 70 yo admitted 11/30 with hyponatremia, AMS and tachypnea after leaving ED without receiving lab work results 11/28. 12/1 pt with new AFib with RVR. 12/3 worsening AMS and placed on Bipap. PMhx: lupus, COPD, PPM, neuropathy, colitis, RLS, BPPV    PT Comments    Session focused on progressing pt's independence with ambulation. Pt progressed to only 1 UE support on the IV pole to start and gradually progressed to no UE support to ambulate by the end of the session. However, her hx of a L heel spur (better with tennis shoes donned) and R hip issues result in an antalgic and trendelenburg gait pattern. Pt also with anxiety in regards to mobilizing without UE support, thereby only ambulating short distances without UE support at this time, still needing min guard-minA throughout. At this time, pt does display better stability and less need for assistance when using a RW, thus recommending one at d/c. Hopefully, pt can progress to only needing a cane or no AD by d/c. Will continue to follow acutely. Current recommendations remain appropriate.    Recommendations for follow up therapy are one component of a multi-disciplinary discharge planning process, led by the attending physician.  Recommendations may be updated based on patient status, additional functional criteria and insurance authorization.  Follow Up Recommendations  Home health PT     Assistance Recommended at Discharge Intermittent Supervision/Assistance  Equipment Recommendations  Rolling walker (2 wheels) (may progress to cane or no AD)    Recommendations for Other Services       Precautions / Restrictions Precautions Precautions: Fall Precaution Comments: monitor HR & SpO2 Restrictions Weight Bearing Restrictions: No     Mobility  Bed Mobility               General bed  mobility comments: Pt sitting up in recliner upon arrival.    Transfers Overall transfer level: Needs assistance Equipment used: None Transfers: Sit to/from Stand Sit to Stand: Min guard           General transfer comment: Pt pushing up from recliner with min guard assist for safety. Pt with trunk sway, widening stance upon initially coming to stand, displaying some anxiety in regards to standing without UE support.    Ambulation/Gait Ambulation/Gait assistance: Min guard;Min assist Gait Distance (Feet): 140 Feet Assistive device: IV Pole;None Gait Pattern/deviations: Step-through pattern;Trendelenburg;Decreased stride length;Decreased stance time - right;Decreased weight shift to right;Antalgic;Trunk flexed Gait velocity: reduced Gait velocity interpretation: <1.8 ft/sec, indicate of risk for recurrent falls   General Gait Details: Pt with antalgic gait pattern due to heel spur on L, but reports improved tolerance when wearing her tennis shoes which she wore today. Pt with trendelenburg pattern also with hx of R hip issues. Pt anxious in regards to progressing to no UE support, starting with R hand on IV pole and gradually cuing pt to remove more fingers from IV pole and eventually progress to no UE support with option for wall rail support in hall if needed for final ~54 ft. Min guard majority of time, but intermittent light minA when pt appeared to stagger more for safety. Cues provided to adduct scapulas and look superiorly to improve posture.   Stairs             Wheelchair Mobility    Modified Rankin (Stroke Patients Only)       Balance Overall  balance assessment: Needs assistance Sitting-balance support: No upper extremity supported;Feet supported Sitting balance-Leahy Scale: Fair     Standing balance support: Single extremity supported;No upper extremity supported;During functional activity Standing balance-Leahy Scale: Fair Standing balance comment:  Progressed from needing 1 UE support to no UE support when ambulating, but some instability needing min guard-minA to prevent LOB.                            Cognition Arousal/Alertness: Awake/alert Behavior During Therapy: WFL for tasks assessed/performed Overall Cognitive Status: Within Functional Limits for tasks assessed                                          Exercises      General Comments General comments (skin integrity, edema, etc.): HR varying from 110s to 140s often, O2 pleth readings unreliable on RA; educated pt to perform scapular adduction, LAQ, and hip ab/add as HEP currently      Pertinent Vitals/Pain Pain Assessment: Faces Faces Pain Scale: Hurts little more Pain Location: L heel, R hip Pain Descriptors / Indicators: Grimacing;Guarding Pain Intervention(s): Monitored during session;Limited activity within patient's tolerance;Repositioned    Home Living                          Prior Function            PT Goals (current goals can now be found in the care plan section) Acute Rehab PT Goals Patient Stated Goal: to get better PT Goal Formulation: With patient/family Time For Goal Achievement: 04/11/21 Potential to Achieve Goals: Good Progress towards PT goals: Progressing toward goals    Frequency    Min 3X/week      PT Plan Current plan remains appropriate;Equipment recommendations need to be updated    Co-evaluation              AM-PAC PT "6 Clicks" Mobility   Outcome Measure  Help needed turning from your back to your side while in a flat bed without using bedrails?: A Little Help needed moving from lying on your back to sitting on the side of a flat bed without using bedrails?: A Little Help needed moving to and from a bed to a chair (including a wheelchair)?: A Little Help needed standing up from a chair using your arms (e.g., wheelchair or bedside chair)?: A Little Help needed to walk in  hospital room?: A Little Help needed climbing 3-5 steps with a railing? : A Little 6 Click Score: 18    End of Session Equipment Utilized During Treatment: Gait belt Activity Tolerance: Patient tolerated treatment well Patient left: in chair;with call bell/phone within reach;with family/visitor present Nurse Communication: Mobility status;Other (comment) (vitals) PT Visit Diagnosis: Other abnormalities of gait and mobility (R26.89);Muscle weakness (generalized) (M62.81);Unsteadiness on feet (R26.81);Difficulty in walking, not elsewhere classified (R26.2)     Time: 7322-0254 PT Time Calculation (min) (ACUTE ONLY): 20 min  Charges:  $Gait Training: 8-22 mins                     Moishe Spice, PT, DPT Acute Rehabilitation Services  Pager: [phone] Office: [place] 03/30/2021, 2:25 PM

## 2021-03-30 NOTE — Progress Notes (Signed)
Occupational Therapy Treatment Patient Details Name: Sheila Morales MRN: [id number] DOB: 08-03-1950 Today's Date: 03/30/2021   History of present illness 70 yo admitted 11/30 with hyponatremia, AMS and tachypnea after leaving ED without receiving lab work results 11/28. 12/1 pt with new AFib with RVR. 12/3 worsening AMS and placed on Bipap. PMhx: lupus, COPD, PPM, neuropathy, colitis, RLS, BPPV   OT comments  Patient seen by skilled OT to address functional transfers, LB dressing, and standing tolerance. Patient demonstrated good safety with transfer to toilet in bathroom using rail to assist but  did require verbal cues to push up from recliner to stand. Patient was able to address LB dressing with doffing and donning socks and shoes and tolerated standing for 3 minutes. Patient making good gains with OT treatment. Acute OT to continue to follow.    Recommendations for follow up therapy are one component of a multi-disciplinary discharge planning process, led by the attending physician.  Recommendations may be updated based on patient status, additional functional criteria and insurance authorization.    Follow Up Recommendations  Home health OT    Assistance Recommended at Discharge Intermittent Supervision/Assistance  Equipment Recommendations  Other (comment)    Recommendations for Other Services      Precautions / Restrictions Precautions Precautions: Fall Precaution Comments: monitor HR & SpO2 Restrictions Weight Bearing Restrictions: No       Mobility Bed Mobility               General bed mobility comments: Pt sitting up in recliner upon arrival.    Transfers Overall transfer level: Needs assistance Equipment used: None Transfers: Sit to/from Stand Sit to Stand: Min guard           General transfer comment: required cues to push up from recliner vs pulling up on walker     Balance Overall balance assessment: Needs assistance Sitting-balance support: No  upper extremity supported;Feet supported Sitting balance-Leahy Scale: Fair     Standing balance support: Single extremity supported;No upper extremity supported;During functional activity Standing balance-Leahy Scale: Fair Standing balance comment: performed static standing with reaching and balance activities without UE support                           ADL either performed or assessed with clinical judgement   ADL Overall ADL's : Needs assistance/impaired                     Lower Body Dressing: Supervision/safety;Sitting/lateral leans Lower Body Dressing Details (indicate cue type and reason): doffed and donned socks and shoes to address Toilet Transfer: Min guard;Regular Toilet;Rolling walker (2 wheels);Grab bars Toilet Transfer Details (indicate cue type and reason): patient ambulated to bathroom with RW and used rail to lower and raise from toilet         Functional mobility during ADLs: Min guard General ADL Comments: difficulty managing lines witihout assistance for tranfers    Extremity/Trunk Assessment              Vision       Perception     Praxis      Cognition Arousal/Alertness: Awake/alert Behavior During Therapy: WFL for tasks assessed/performed Overall Cognitive Status: Within Functional Limits for tasks assessed                                 General Comments: demonstrated good safety with  functional transfers          Exercises     Shoulder Instructions       General Comments HR increased to 120-140 during standing tasks    Pertinent Vitals/ Pain       Pain Assessment: Faces Faces Pain Scale: Hurts little more Pain Location: L heel, R hip Pain Descriptors / Indicators: Grimacing;Guarding Pain Intervention(s): Monitored during session;Limited activity within patient's tolerance;Repositioned  Home Living                                          Prior Functioning/Environment               Frequency  Min 2X/week        Progress Toward Goals  OT Goals(current goals can now be found in the care plan section)  Progress towards OT goals: Progressing toward goals  Acute Rehab OT Goals Patient Stated Goal: go home OT Goal Formulation: With patient/family Time For Goal Achievement: 04/11/21 Potential to Achieve Goals: Good ADL Goals Pt Will Perform Grooming: with modified independence;standing Pt Will Perform Lower Body Dressing: with modified independence;sit to/from stand Pt Will Transfer to Toilet: with modified independence;ambulating Pt Will Perform Toileting - Clothing Manipulation and hygiene: with modified independence;sit to/from stand Pt Will Perform Tub/Shower Transfer: with modified independence;shower seat;ambulating  Plan Discharge plan remains appropriate    Co-evaluation                 AM-PAC OT "6 Clicks" Daily Activity     Outcome Measure   Help from another person eating meals?: None Help from another person taking care of personal grooming?: A Little Help from another person toileting, which includes using toliet, bedpan, or urinal?: A Little Help from another person bathing (including washing, rinsing, drying)?: A Little Help from another person to put on and taking off regular upper body clothing?: A Little Help from another person to put on and taking off regular lower body clothing?: A Little 6 Click Score: 19    End of Session Equipment Utilized During Treatment: Gait belt;Rolling walker (2 wheels)  OT Visit Diagnosis: Unsteadiness on feet (R26.81);Muscle weakness (generalized) (M62.81);Pain   Activity Tolerance Patient tolerated treatment well   Patient Left in chair;with call bell/phone within reach;with chair alarm set;with family/visitor present   Nurse Communication Mobility status        Time: 7322-0254 OT Time Calculation (min): 23 min  Charges: OT General Charges $OT Visit: 1 Visit OT  Treatments $Self Care/Home Management : 8-22 mins $Therapeutic Activity: 8-22 mins  Lodema Hong, OTA Acute Rehabilitation Services  Pager [phone] Office [place] 03/30/2021, 3:08 PM

## 2021-03-30 NOTE — Progress Notes (Addendum)
Progress Note  Patient Name: Sheila Morales Date of Encounter: 03/30/2021  [facility] Cardiologist: Elouise Munroe, MD   Subjective   Feeling well. No chest pain or palpitations.  Breathing stable.   Inpatient Medications    Scheduled Meds:  amiodarone  200 mg Oral BID   apixaban  5 mg Oral BID   aspirin EC  81 mg Oral Daily   bisacodyl  10 mg Rectal Once   folic acid  1 mg Oral Daily   gabapentin  300 mg Oral TID   ipratropium  0.5 mg Nebulization TID   levalbuterol  0.63 mg Nebulization TID   mouth rinse  15 mL Mouth Rinse BID   nicotine  7 mg Transdermal Daily   polyethylene glycol  17 g Oral BID   predniSONE  40 mg Oral Q breakfast   senna-docusate  1 tablet Oral BID   sodium chloride flush  10-40 mL Intracatheter Q12H   sodium chloride  1 g Oral Q4H   Continuous Infusions:  sodium chloride Stopped (03/28/21 1756)    ceFAZolin (ANCEF) IV 2 g (03/30/21 0532)   diltiazem (CARDIZEM) infusion 15 mg/hr (03/30/21 0755)   PRN Meds: sodium chloride, acetaminophen **OR** acetaminophen, adenosine (ADENOCARD) IV, albuterol, benzonatate, labetalol, lip balm, morphine injection, nitroGLYCERIN, ondansetron **OR** ondansetron (ZOFRAN) IV, oxyCODONE, sodium chloride flush   Vital Signs    Vitals:   03/30/21 0717 03/30/21 0721 03/30/21 0722 03/30/21 0827  BP: 137/79     Pulse:  91    Resp:   20   Temp: 97.9 F (36.6 C)     TempSrc: Oral     SpO2:    97%  Weight:      Height:        Intake/Output Summary (Last 24 hours) at 03/30/2021 0857 Last data filed at 03/30/2021 0522 Gross per 24 hour  Intake 220 ml  Output 1700 ml  Net -1480 ml   Last 3 Weights 03/30/2021 03/29/2021 03/29/2021  Weight (lbs) 137 lb 5.6 oz 137 lb 4.8 oz 137 lb 4.8 oz  Weight (kg) 62.3 kg 62.279 kg 62.279 kg      Telemetry    Atrial fibrillation, HR 100-120s - Personally Reviewed  ECG    N/A  Physical Exam   GEN: No acute distress.   Neck: No JVD Cardiac: Ir IR tachycardic, no  murmurs, rubs, or gallops.  Respiratory: Course breath sound  GI: Soft, nontender, non-distended  MS: No edema; No deformity. Neuro:  Nonfocal  Psych: Normal affect   Labs     Chemistry Recent Labs  Lab 03/24/21 0011 03/24/21 9937 03/25/21 0123 03/26/21 0522 03/27/21 1245 03/28/21 0226 03/29/21 0328 03/30/21 0217  NA 122* 125* 123*   < > 121* 127* 128* 128*  K 3.3* 4.2 4.0   < > 4.0 4.3 5.0 4.7  CL 90* 91* 88*   < > 89* 94* 96* 96*  CO2 21* 20* 20*   < > 20* 23 24 24   GLUCOSE 152* 124* 309*   < > 369* 154* 147* 115*  BUN 28* 28* 29*   < > 29* 30* 28* 25*  CREATININE 1.60* 1.75* 1.55*   < > 1.19* 1.12* 1.12* 0.96  CALCIUM 8.2* 8.6* 7.9*   < > 8.3* 8.8* 9.1 9.0  MG 2.2 2.1  --   --   --  1.8  --   --   PROT 7.0 6.6  --   --  5.8*  --   --   --  ALBUMIN 3.4* 3.6 2.9*  --  2.5*  --   --   --   AST 27 26  --   --  48*  --   --   --   ALT 20 18  --   --  34  --   --   --   ALKPHOS 90 84  --   --  74  --   --   --   BILITOT 0.3 0.6  --   --  0.6  --   --   --   GFRNONAA 34* 31* 36*   < > 49* 53* 53* >60  ANIONGAP 11 14 15    < > 12 10 8 8    < > = values in this interval not displayed.     Hematology Recent Labs  Lab 03/27/21 0213 03/28/21 0226 03/29/21 0328  WBC 22.2* 21.2* 21.6*  RBC 3.74* 3.38* 3.36*  HGB 11.6* 10.6* 10.4*  HCT 32.9* 29.7* 29.4*  MCV 88.0 87.9 87.5  MCH 31.0 31.4 31.0  MCHC 35.3 35.7 35.4  RDW 14.6 14.8 15.1  PLT 271 281 343    BNP Recent Labs  Lab 03/24/21 0011 03/28/21 0226  BNP 2,707.6* 680.4*     Radiology    US PELVIC COMPLETE WITH TRANSVAGINAL  Result Date: 03/29/2021 CLINICAL DATA:  Pelvic mass EXAM: TRANSABDOMINAL AND TRANSVAGINAL ULTRASOUND OF PELVIS TECHNIQUE: Both transabdominal and transvaginal ultrasound examinations of the pelvis were performed. Transabdominal technique was performed for global imaging of the pelvis including uterus, ovaries, adnexal regions, and pelvic cul-de-sac. It was necessary to proceed with  endovaginal exam following the transabdominal exam to visualize the uterus endometrium ovaries. COMPARISON:  CT 03/26/2021 FINDINGS: Uterus Measurements: 6.6 x 3.5 x 3.7 cm = volume: 44.9 mL. No fibroids or other mass visualized. Endometrium Thickness: 3.5 mm. Small amount of fluid in the endometrial canal. Hypoechoic nodule measuring 7 by 3 x 4 mm. Right ovary Not visualized.  Dilated pelvic vessels. Left ovary Meaurements: 2.9 x 1.4 x 1.1 cm Volume:2.4 mL. No adnexal mass is seen. There is no cystic structure visible in the left adnexa. There are dilated pelvic vessels. Other findings None IMPRESSION: 1. The described cystic lesion at the left adnexa is not seen by sonography. 2. Small amount of fluid in the endometrial canal with suspected 7 mm endometrial nodule. Gynecology consultation with possible tissue sampling is recommended. 3. Dilated left greater than right pelvic vessels. Electronically Signed   By: Donavan Foil M.D.   On: 03/29/2021 16:09    Cardiac Studies   ECHO 03/22/21:   1. Left ventricular ejection fraction, by estimation, is 70 to 75%. The  left ventricle has hyperdynamic function. The left ventricle has no  regional wall motion abnormalities. There is mild left ventricular  hypertrophy. Left ventricular diastolic  parameters are consistent with Grade II diastolic dysfunction  (pseudonormalization). Elevated left atrial pressure.   2. Right ventricular systolic function is normal. The right ventricular  size is normal. There is mildly elevated pulmonary artery systolic  pressure.   3. Left atrial size was mildly dilated.   4. The mitral valve is normal in structure. Mild mitral valve  regurgitation. No evidence of mitral stenosis.   5. The aortic valve is tricuspid. Aortic valve regurgitation is not  visualized. Aortic valve sclerosis/calcification is present, without any  evidence of aortic stenosis.   6. The inferior vena cava is normal in size with greater than 50%   respiratory variability, suggesting right  atrial pressure of 3 mmHg.   Comparison(s): No significant change from prior study.   Patient Profile     70 y.o. female with a hx of COPD, ischemic colitis, IBS, RLS, SLE on methotrexate, BPPV, HTN, chronic diastolic heart failure, and SSS s/p PPM seen for CHF exacerbation in setting of E.Coli bacteremia 2nd to pyelonephritis, possible CAP, and UTI.   Assessment & Plan    Acute on chronic diastolic CHF - Echo with hyperdynamic LVEF at 70-50% and garde II DD.  - Initially given IV lasix on admit however on holiday since than - Net I & O +3.4L.  - Currently breathing issue likely due to pulmonary issue - Likely PRN lasix at discharge (recently prescribed lasix daily in outpatient setting by pulmonologist but hasn't started)  2. New onset atrial fibrillation  - Likely precipitated by acute illness - Started on IV amiodarone >> currently on amiodarone 200mg  BID (continue for total one week) then 200mg  daily - HR in 90-120s currently >> on IV cardizem >> likely change to PO later today - Continue Eliquis - Plan outpatient cardioversion after 3 weeks of uninterrupted anticoagulation  - Has pacemaker for backup  Otherwise per primary team.   For questions or updates, please contact [facility] Please consult [email or web address] for contact info under        SignedLeanor Kail, PA  03/30/2021, 8:57 AM     Personally seen and examined. Agree with above.  Atrial fibrillation under reasonable rate control.  On Eliquis.  Amiodarone 200 mg twice daily.  Plan to taper.  Has pacemaker.  I would be comfortable with transitioning to Cardizem CD 360 mg a day.  Will order. A few days back, she was in sinus rhythm intermittently.  As her lungs continue to improve, hopefully the atrial fibrillation will follow especially with the amiodarone and she will chemically convert.  If after 3 weeks of continuous Eliquis she is still in atrial fibrillation, we  should plan for outpatient elective cardioversion.  EF 75%.  Candee Furbish, MD

## 2021-03-31 LAB — BASIC METABOLIC PANEL
Anion gap: 7 (ref 5–15)
BUN: 22 mg/dL (ref 8–23)
CO2: 24 mmol/L (ref 22–32)
Calcium: 8.8 mg/dL — ABNORMAL LOW (ref 8.9–10.3)
Chloride: 97 mmol/L — ABNORMAL LOW (ref 98–111)
Creatinine, Ser: 0.93 mg/dL (ref 0.44–1.00)
GFR, Estimated: >60 mL/min (ref >60)
Glucose, Bld: 109 mg/dL — ABNORMAL HIGH (ref 70–99)
Potassium: 4.5 mmol/L (ref 3.5–5.1)
Sodium: 128 mmol/L — ABNORMAL LOW (ref 135–145)

## 2021-03-31 MED ORDER — METOPROLOL TARTRATE 25 MG PO TABS
25.0000 mg | ORAL_TABLET | Freq: Two times a day (BID) | ORAL | Status: DC
  Administered 2021-03-31 – 2021-04-01 (×3): 25 mg via ORAL
  Filled 2021-03-31 (×3): qty 1

## 2021-03-31 MED ORDER — FUROSEMIDE 10 MG/ML IJ SOLN
40.0000 mg | Freq: Once | INTRAMUSCULAR | Status: AC
  Administered 2021-03-31: 40 mg via INTRAVENOUS
  Filled 2021-03-31: qty 4

## 2021-03-31 NOTE — Progress Notes (Signed)
Physical Therapy Treatment Patient Details Name: Sheila Morales MRN: [id number] DOB: [birth year] Today's Date: 03/31/2021   History of Present Illness 70 yo admitted 11/30 with hyponatremia, AMS and tachypnea after leaving ED without receiving lab work results 11/28. 12/1 pt with new AFib with RVR. 12/3 worsening AMS and placed on Bipap. PMhx: lupus, COPD, PPM, neuropathy, colitis, RLS, BPPV    PT Comments    Focused session on further assessing potential assistive devices pt could safely use to improve her stability with gait. Attempted using a SPC, but pt had difficulty sequencing it without significantly decreasing her gait speed, still needing min guard-minA to maintain stability. Then attempted using a rollator, with pt displaying improved stability only needing min guard for safety. Pt needs cues to remain proximal to her rollator, but otherwise displays good understanding on safe usage. She would benefit from a rollator at d/c to improve her stability and provide her with a place to sit for energy conservation. Will continue to follow acutely. Current recommendations remain appropriate.   Recommendations for follow up therapy are one component of a multi-disciplinary discharge planning process, led by the attending physician.  Recommendations may be updated based on patient status, additional functional criteria and insurance authorization.  Follow Up Recommendations  Home health PT     Assistance Recommended at Discharge Intermittent Supervision/Assistance  Equipment Recommendations  Rollator (4 wheels)    Recommendations for Other Services       Precautions / Restrictions Precautions Precautions: Fall Precaution Comments: monitor HR & SpO2 Restrictions Weight Bearing Restrictions: No     Mobility  Bed Mobility               General bed mobility comments: Pt sitting up in recliner upon arrival.    Transfers Overall transfer level: Needs assistance Equipment used:  None;Straight cane;Rollator (4 wheels) Transfers: Sit to/from Stand Sit to Stand: Min guard           General transfer comment: Pt coming to stand to no UE support with improved stability this date, x1 rep. Demonstrated proper placement of SPC for transfers, pt displaying good comprehension, coming to stand safely without LOB, x1 rep. Demonstrated brake management on rollator for safe transfers, good compliance by pt, x1 rep. No LOB with any transfers, min guard for safety.    Ambulation/Gait Ambulation/Gait assistance: Min guard;Min assist Gait Distance (Feet): 80 Feet (x2 bouts of ~80 ft) Assistive device: Rollator (4 wheels);Straight cane Gait Pattern/deviations: Step-through pattern;Trendelenburg;Decreased stride length;Antalgic;Trunk flexed;Step-to pattern;Decreased step length - right;Decreased stance time - left;Decreased weight shift to left Gait velocity: reduced Gait velocity interpretation: <1.8 ft/sec, indicate of risk for recurrent falls   General Gait Details: Pt with antalgic gait pattern due to heel spur on L, but reports improved tolerance when wearing her tennis shoes which she wore today. Pt with trendelenburg pattern also with hx of R hip issues. Demonstrated proper sequencing of SPC in R hand to touch down simultaneously when stepping with L foot. Pt with difficulty maintaining this pattern, but able to stop and identify mistakes and correct self, min guard-minA for stability with SPC. Second bout pt using rollator with min guard for safety, cuing pt to keep it proximal to her.   Stairs             Wheelchair Mobility    Modified Rankin (Stroke Patients Only)       Balance Overall balance assessment: Needs assistance Sitting-balance support: No upper extremity supported;Feet supported Sitting balance-Leahy Scale: Fair  Standing balance support: Single extremity supported;No upper extremity supported;During functional activity;Bilateral upper  extremity supported Standing balance-Leahy Scale: Fair Standing balance comment: Able to stand statically without UE support but benefits from 1-2 UE support for improved stability with mobility                            Cognition Arousal/Alertness: Awake/alert Behavior During Therapy: WFL for tasks assessed/performed Overall Cognitive Status: Within Functional Limits for tasks assessed                                 General Comments: Needs cues for sequencing use of new ADs to pt, but pt with good ability to catch her own mistakes and correct without cues.        Exercises      General Comments        Pertinent Vitals/Pain Pain Assessment: Faces Faces Pain Scale: Hurts little more Pain Location: L heel, R hip Pain Descriptors / Indicators: Grimacing;Guarding Pain Intervention(s): Limited activity within patient's tolerance;Monitored during session;Repositioned    Home Living                          Prior Function            PT Goals (current goals can now be found in the care plan section) Acute Rehab PT Goals Patient Stated Goal: to get better PT Goal Formulation: With patient/family Time For Goal Achievement: 04/11/21 Potential to Achieve Goals: Good Progress towards PT goals: Progressing toward goals    Frequency    Min 3X/week      PT Plan Current plan remains appropriate;Equipment recommendations need to be updated    Co-evaluation              AM-PAC PT "6 Clicks" Mobility   Outcome Measure  Help needed turning from your back to your side while in a flat bed without using bedrails?: A Little Help needed moving from lying on your back to sitting on the side of a flat bed without using bedrails?: A Little Help needed moving to and from a bed to a chair (including a wheelchair)?: A Little Help needed standing up from a chair using your arms (e.g., wheelchair or bedside chair)?: A Little Help needed to walk  in hospital room?: A Little Help needed climbing 3-5 steps with a railing? : A Little 6 Click Score: 18    End of Session Equipment Utilized During Treatment: Gait belt Activity Tolerance: Patient tolerated treatment well Patient left: in chair;with call bell/phone within reach;with family/visitor present Nurse Communication: Mobility status PT Visit Diagnosis: Other abnormalities of gait and mobility (R26.89);Muscle weakness (generalized) (M62.81);Unsteadiness on feet (R26.81);Difficulty in walking, not elsewhere classified (R26.2)     Time: 6606-3016 PT Time Calculation (min) (ACUTE ONLY): 28 min  Charges:  $Gait Training: 23-37 mins                     Moishe Spice, PT, DPT Acute Rehabilitation Services  Pager: [phone] Office: [place] 03/31/2021, 3:47 PM

## 2021-03-31 NOTE — Progress Notes (Signed)
Cardiology Progress Note  Patient ID: Sheila Morales MRN: [id number] DOB: [birth year] Date of Encounter: 03/31/2021  Primary Cardiologist: Elouise Munroe, MD  Subjective   Chief Complaint: None. Wants to go home.   HPI: A. fib still not rate controlled.  Chest x-ray with small pleural effusions.  Denies symptoms.  Oxygen levels still in the low 90s.  ROS:  All other ROS reviewed and negative. Pertinent positives noted in the HPI.     Inpatient Medications  Scheduled Meds:  amiodarone  200 mg Oral BID   apixaban  5 mg Oral BID   aspirin EC  81 mg Oral Daily   bisacodyl  10 mg Rectal Once   diltiazem  360 mg Oral Daily   folic acid  1 mg Oral Daily   furosemide  40 mg Intravenous Once   gabapentin  300 mg Oral TID   mouth rinse  15 mL Mouth Rinse BID   metoprolol tartrate  25 mg Oral BID   nicotine  7 mg Transdermal Daily   polyethylene glycol  17 g Oral BID   predniSONE  40 mg Oral Q breakfast   senna-docusate  1 tablet Oral BID   sodium chloride flush  10-40 mL Intracatheter Q12H   sodium chloride  1 g Oral Q4H   Continuous Infusions:  sodium chloride Stopped (03/28/21 1756)   PRN Meds: sodium chloride, acetaminophen **OR** acetaminophen, benzonatate, labetalol, levalbuterol, lip balm, morphine injection, nitroGLYCERIN, ondansetron **OR** ondansetron (ZOFRAN) IV, oxyCODONE, sodium chloride flush   Vital Signs   Vitals:   03/30/21 0827 03/30/21 2025 03/31/21 0115 03/31/21 0440  BP:  133/80 (!) 139/97   Pulse:  93 100   Resp:  17 15   Temp:  99 F (37.2 C) 98.4 F (36.9 C)   TempSrc:  Oral Oral   SpO2: 97% 97% 96%   Weight:    62.4 kg  Height:        Intake/Output Summary (Last 24 hours) at 03/31/2021 0820 Last data filed at 03/31/2021 0441 Gross per 24 hour  Intake 240 ml  Output 1450 ml  Net -1210 ml   Last 3 Weights 03/31/2021 03/30/2021 03/29/2021  Weight (lbs) 137 lb 9.6 oz 137 lb 5.6 oz 137 lb 4.8 oz  Weight (kg) 62.415 kg 62.3 kg 62.279 kg       Telemetry  Overnight telemetry shows A. fib heart rate 120-140 bpm, which I personally reviewed.   Physical Exam   Vitals:   03/30/21 0827 03/30/21 2025 03/31/21 0115 03/31/21 0440  BP:  133/80 (!) 139/97   Pulse:  93 100   Resp:  17 15   Temp:  99 F (37.2 C) 98.4 F (36.9 C)   TempSrc:  Oral Oral   SpO2: 97% 97% 96%   Weight:    62.4 kg  Height:        Intake/Output Summary (Last 24 hours) at 03/31/2021 0820 Last data filed at 03/31/2021 0441 Gross per 24 hour  Intake 240 ml  Output 1450 ml  Net -1210 ml    Last 3 Weights 03/31/2021 03/30/2021 03/29/2021  Weight (lbs) 137 lb 9.6 oz 137 lb 5.6 oz 137 lb 4.8 oz  Weight (kg) 62.415 kg 62.3 kg 62.279 kg    Body mass index is 25.17 kg/m.   General: Well nourished, well developed, in no acute distress Head: Atraumatic, normal size  Eyes: PEERLA, EOMI  Neck: Supple, JVD 7 to 8 cm of water Endocrine: No thryomegaly Cardiac: Normal  S1, S2; irregular rhythm, no murmurs Lungs: Diminished breath sounds bilaterally Abd: Soft, nontender, no hepatomegaly  Ext: No edema, pulses 2+ Musculoskeletal: No deformities, BUE and BLE strength normal and equal Skin: Warm and dry, no rashes   Neuro: Alert and oriented to person, place, time, and situation, CNII-XII grossly intact, no focal deficits  Psych: Normal mood and affect   Labs  High Sensitivity Troponin:  No results for input(s): TROPONINIHS in the last 720 hours.   Cardiac EnzymesNo results for input(s): TROPONINI in the last 168 hours. No results for input(s): TROPIPOC in the last 168 hours.  Chemistry Recent Labs  Lab 03/24/21 [phone] 03/25/21 0123 03/26/21 0522 03/27/21 1245 03/28/21 0226 03/29/21 0328 03/30/21 0217 03/31/21 0249  NA 125* 123*   < > 121*   < > 128* 128* 128*  K 4.2 4.0   < > 4.0   < > 5.0 4.7 4.5  CL 91* 88*   < > 89*   < > 96* 96* 97*  CO2 20* 20*   < > 20*   < > 24 24 24   GLUCOSE 124* 309*   < > 369*   < > 147* 115* 109*  BUN 28* 29*   < > 29*   <  > 28* 25* 22  CREATININE 1.75* 1.55*   < > 1.19*   < > 1.12* 0.96 0.93  CALCIUM 8.6* 7.9*   < > 8.3*   < > 9.1 9.0 8.8*  PROT 6.6  --   --  5.8*  --   --   --   --   ALBUMIN 3.6 2.9*  --  2.5*  --   --   --   --   AST 26  --   --  48*  --   --   --   --   ALT 18  --   --  34  --   --   --   --   ALKPHOS 84  --   --  74  --   --   --   --   BILITOT 0.6  --   --  0.6  --   --   --   --   GFRNONAA 31* 36*   < > 49*   < > 53* >60 >60  ANIONGAP 14 15   < > 12   < > 8 8 7    < > = values in this interval not displayed.    Hematology Recent Labs  Lab 03/27/21 0213 03/28/21 0226 03/29/21 0328  WBC 22.2* 21.2* 21.6*  RBC 3.74* 3.38* 3.36*  HGB 11.6* 10.6* 10.4*  HCT 32.9* 29.7* 29.4*  MCV 88.0 87.9 87.5  MCH 31.0 31.4 31.0  MCHC 35.3 35.7 35.4  RDW 14.6 14.8 15.1  PLT 271 281 343   BNP Recent Labs  Lab 03/28/21 0226  BNP 680.4*    DDimer No results for input(s): DDIMER in the last 168 hours.   Radiology  DG CHEST PORT 1 VIEW  Result Date: 03/30/2021 CLINICAL DATA:  Atrial fibrillation with short of breath EXAM: PORTABLE CHEST 1 VIEW COMPARISON:  03/28/2021 FINDINGS: Improved bilateral airspace disease likely due to clearing pneumonia or edema. Underlying COPD. Small pleural effusions bilaterally. Dual lead pacemaker unchanged. IMPRESSION: Interval improvement in bilateral airspace disease due to clearing edema or pneumonia. Small bilateral effusions. Electronically Signed   By: Franchot Gallo M.D.   On: 03/30/2021 15:11   US PELVIC COMPLETE WITH  TRANSVAGINAL  Result Date: 03/29/2021 CLINICAL DATA:  Pelvic mass EXAM: TRANSABDOMINAL AND TRANSVAGINAL ULTRASOUND OF PELVIS TECHNIQUE: Both transabdominal and transvaginal ultrasound examinations of the pelvis were performed. Transabdominal technique was performed for global imaging of the pelvis including uterus, ovaries, adnexal regions, and pelvic cul-de-sac. It was necessary to proceed with endovaginal exam following the transabdominal  exam to visualize the uterus endometrium ovaries. COMPARISON:  CT 03/26/2021 FINDINGS: Uterus Measurements: 6.6 x 3.5 x 3.7 cm = volume: 44.9 mL. No fibroids or other mass visualized. Endometrium Thickness: 3.5 mm. Small amount of fluid in the endometrial canal. Hypoechoic nodule measuring 7 by 3 x 4 mm. Right ovary Not visualized.  Dilated pelvic vessels. Left ovary Meaurements: 2.9 x 1.4 x 1.1 cm Volume:2.4 mL. No adnexal mass is seen. There is no cystic structure visible in the left adnexa. There are dilated pelvic vessels. Other findings None IMPRESSION: 1. The described cystic lesion at the left adnexa is not seen by sonography. 2. Small amount of fluid in the endometrial canal with suspected 7 mm endometrial nodule. Gynecology consultation with possible tissue sampling is recommended. 3. Dilated left greater than right pelvic vessels. Electronically Signed   By: Donavan Foil M.D.   On: 03/29/2021 16:09    Cardiac Studies  TTE 03/22/2021  1. Left ventricular ejection fraction, by estimation, is 70 to 75%. The  left ventricle has hyperdynamic function. The left ventricle has no  regional wall motion abnormalities. There is mild left ventricular  hypertrophy. Left ventricular diastolic  parameters are consistent with Grade II diastolic dysfunction  (pseudonormalization). Elevated left atrial pressure.   2. Right ventricular systolic function is normal. The right ventricular  size is normal. There is mildly elevated pulmonary artery systolic  pressure.   3. Left atrial size was mildly dilated.   4. The mitral valve is normal in structure. Mild mitral valve  regurgitation. No evidence of mitral stenosis.   5. The aortic valve is tricuspid. Aortic valve regurgitation is not  visualized. Aortic valve sclerosis/calcification is present, without any  evidence of aortic stenosis.   6. The inferior vena cava is normal in size with greater than 50%  respiratory variability, suggesting right atrial  pressure of 3 mmHg.   Patient Profile  SHIRLY BARTOSIEWICZ is a 70 y.o. female with COPD, lupus on methotrexate, hypertension, diastolic heart failure, sick sinus syndrome status post pacemaker implantation, diastolic heart failure who was admitted on 03/22/2021 with acute on chronic diastolic heart failure as well as community-acquired pneumonia/UTI.  She was also admitted with severe hyponatremia.  Assessment & Plan   #Acute on chronic diastolic heart failure -7.1 L of urine output.  She is actually +45 cc.  Chest x-ray yesterday shows improvement in airspace disease. -She does not appear that volume overloaded.  Hospital medicine is given her 1 dose of IV Lasix.  I agree with this.  We will transition to 20 mg of p.o. Lasix daily starting tomorrow. -If her oxygen levels remain an issue would consider chest CT.  She does not appear to have volume to explain this. Suspect COPD could be a big issue.   #New onset Afib -Triggered by acute illness. -Evaluated by team over the week and they have plans for rate control and to load her with amiodarone.  They would then pursue outpatient cardioversion. -Rates are still poorly controlled despite diltiazem extended release 360 mg daily.  We will add metoprolol to tartrate 25 twice daily.  Can titrate this up for better rate  control. -She has no wheezing.  I believe this is okay in the setting of COPD. -Would continue Eliquis 5 mg twice daily. -We will arrange outpatient follow-up in 1 to 2 weeks so she can be set up for an outpatient cardioversion.  #Acute Hypoxic Resp failure -Appears euvolemic to me.  Would consider chest CT if she continues to have hypoxia.  #Hyponatremia -This has improved with diuresis.   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 -Continue with fluid restriction per their recommendation.  #E. Coli Bacteremia #Pyelonephritis -Abx per primary team   [facility] will sign off.   Medication Recommendations:  As above.   Transition to Lasix 20 mg daily tomorrow.  Continue diltiazem extended release 360 daily.  Continue amiodarone 200 mg twice daily.  Add metoprolol to tartrate 25 twice daily.  Continue Eliquis 5 mg twice daily. Other recommendations (labs, testing, etc): None. Follow up as an outpatient: We will arrange outpatient hospital follow-up in 1 to 2 weeks.  For questions or updates, please contact [place] Please consult [email or web address] for contact info under   Time Spent with Patient: I have spent a total of 25 minutes with patient reviewing hospital notes, telemetry, EKGs, labs and examining the patient as well as establishing an assessment and plan that was discussed with the patient.  > 50% of time was spent in direct patient care.    Signed, Addison Naegeli. Audie Box, MD, [place]  03/31/2021 8:20 AM

## 2021-03-31 NOTE — Progress Notes (Signed)
PROGRESS NOTE    Sheila Morales  VQQ:[id number] DOB: July 01, 1950 DOA: 03/21/2021 PCP: Hoyt Koch, MD   Brief Narrative:  Sheila Morales is a 70 y.o. with a past medical history significant for COPD, HFpEF, systemic lupus erythematosus, and ischemic colitis who presented to the emergency department 11/30 for complaints of dyspnea, fatigue, weakness, and altered mental status.  Per husband symptoms began 4 days prior to admission with a worrisome symptom of cough and malaise.  States he attempted to seek medical treatment for patient at local ER/urgent care but t when the wait time exceeded 11 hours they decided to leave and not be seen.  Admitted to [facility] with diastolic HF exacerbation, AKI, and hyponatremia.  Found to have e.coli bacteremia.   Morning of 12/3 patent has again developed worsening mentation with A-fib RVR with recurrent low-grade fever, tachycardia, and hypertension.  PCCM consulted for further management and transfer to ICU  Significant Hospital Events: Including procedures, antibiotic start and stop dates in addition to other pertinent events   11/30 admitted for generalized fatigue, weakness, and altered mental status found to be bacteremic with E. Coli and UTI.  Patient was also with several metabolic derangements including severely hyponatremic with a sodium of 117 12/1 Nephrology and Cardiology consulted for AKI/ hyponatremia and and HF exacerbation/ AF w/ RVR 12/2 ID consult 12/3 transfer to ICU for acute encephalopathy and AF/RVR 12/4 Came off BiPAP this morning. Napping. Still lethargic. No abdominal pain 12/6 Off BiPAP this am, some abdominal muscle use noted, on 4 L HFNC with sats of 92% 12/8: Transferred to [facility]   11/30 SARS/ flu > neg 11/30 Bcx2 > pansensitive e.coli 12/1 MRSA PCR > neg 12/1 UC  > neg 12/2 Bcx 2 > ngtd 12/3 Bcx 2 > ngtd   11/30 cefepime 11/30 flagyl 11/30 vanc > 12/1 12/1 ceftriaxone >    Assessment & Plan:   Principal Problem:    Acute on chronic diastolic heart failure (HCC) Active Problems:   Tobacco use   Irritable bowel syndrome   History of rheumatic fever as a child   COPD (chronic obstructive pulmonary disease) (HCC)   Hyponatremia   AKI (acute kidney injury) ([place])   Hypertension   Pacemaker   Pulmonary vascular congestion   Acute metabolic encephalopathy   Persistent atrial fibrillation (HCC)   Chronic anticoagulation  Acute Hypoxic and hypercapnic Respiratory Failure: secondary to possible CAP versus pulmonary edema.  She is daily smoker.  Chest CT on admission negative for signs of acute pneumonia revealed mild emphysema with questionable mild degree of fibrosis and cardiomegaly, hx of SLE Subsequent CT Chest 12/5>> Extensive upper lobe GG pulmonary infiltrate, suspect infectious.  She required BiPAP in the ICU.  Currently she is on 3 L and saturating 94%. continue with xopenex/ atrovent q6hr with prn albuterol.  On cefazolin, ending today. 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 urine legionella negative. cont v Prednisone 40 mg daily with slow wean ( Fibrotic changes per CT Chest).  Still has rhonchi, chest x-ray yesterday shows no pulmonary edema and improving pleural effusion and improving bilateral airspace disease.  Gave her a dose of IV Lasix today.  I suspect that she will require home oxygen.   E. Coli bacteremia secondary to pyelonephritis: CT Abdomen pelvis with moderate asymmetric right perinephric Stranding, concerning for pyelonephritis. Felt secondary to UTI in immunocompromised patient although UC neg (but was after abx) P: ID following, neg  TTE 12/1, completed cefazolin on 03/30/2021.   Acute on chronic diastolic heart failure: ECHO 12/1 with EF 70-75 and grade II diastolic dysfunction.  Still has rhonchi, chest x-ray yesterday shows improvement.  She was not on oxygen this morning when I saw her and looked much better.  She had already received dose of Lasix 40 mg  IV x1 that I ordered.  Cardiology has ordered 20 mg p.o. Lasix starting tomorrow.  HTN: Controlled  New onset A. fib RVR: Likely secondary to acute illness.  Patient was transition from IV amiodarone to oral amiodarone 03/28/2021, and was transitioned from IV diltiazem to long-acting oral diltiazem 360 mg p.o. daily on 03/30/2021.  Despite of this, patient's heart rate still poorly controlled, even when she is sitting, heart rates were around 124.  She is denying any complaints and she is very eager to go home.  Her husband was at the bedside.  We had a lengthy discussion that it is not ideal with her going home with that heart rate and husband also pushed her to stay overnight and then she agreed to stay 1 more night.  Cardiology has added Toprol-XL.  Hopefully rate will be better controlled.  Nonetheless, she wants to go home tomorrow.  We will reassess.   History of sick sinus syndrome s/p permanent pacemaker: Cardiology following, appreciate assistance   Acute metabolic encephalopathy: Resolved.  She is fully alert and oriented.   Acute Kidney Injury, non-oliguric.  Creatinine peaked at 2.11.  Now resolved.  Baseline creatinine 0.7-0.9.  -Acute on chronic hyponatremia: Lowest was 113 during this hospitalization, currently 128.  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  TSH normal.  They recommended continuing fluid restriction and added salt tablets.   Hx of SLE -Home medications include Plaquenil and Methotrexate  Medication remains on hold Supportive care   Normocytic anemia: Hemoglobin is stable.  CT  Abdomen pelvis with 5.1 cm indeterminate cystic lesion within the left adnexa.  This was not visible on pelvic ultrasound.  However there is small amount of fluid in endometrial canal with suspected 7 mm endometrial nodule and tissue sampling is recommended as outpatient with gynecology.   Peripheral Neuropathy  - on home  gabapentin ?900mg  in am and 1200mg  at [facility], last dose 12/1 -cont low dose gabapentin 12/6 , and avoid morphine/ oxy IR.   DVT prophylaxis: SCDs Start: 03/22/21 0109   Code Status: Full Code  Family Communication: Husband present at bedside.  Plan of care discussed with patient in length and he verbalized understanding and agreed with it.  Status is: Inpatient  Remains inpatient appropriate because: Needs management of A. fib with RVR.  Estimated body mass index is 25.17 kg/m as calculated from the following:   Height as of this encounter: 5\' 2"  (1.575 m).   Weight as of this encounter: 62.4 kg.   Nutritional Assessment: Body mass index is 25.17 kg/m.Marland Kitchen Seen by dietician.  I agree with the assessment and plan as outlined below: Nutrition Status:   Skin Assessment: I have examined the patient's skin and I agree with the wound assessment as performed by the wound care RN as outlined below:    Consultants:  Cardiology Nephrology-signed off ID-signed off  Procedures:  None  Antimicrobials:  Anti-infectives (From admission, onward)    Start     Dose/Rate Route Frequency Ordered Stop   03/27/21 1500  ceFAZolin (ANCEF) IVPB 2g/100 mL premix  2 g 200 mL/hr over 30 Minutes Intravenous Every 8 hours 03/27/21 1425 03/30/21 2341   03/26/21 1800  ceFAZolin (ANCEF) IVPB 2g/100 mL premix  Status:  Discontinued        2 g 200 mL/hr over 30 Minutes Intravenous Every 12 hours 03/26/21 1046 03/27/21 1425   03/22/21 2000  ceFEPIme (MAXIPIME) 2 g in sodium chloride 0.9 % 100 mL IVPB  Status:  Discontinued        2 g 200 mL/hr over 30 Minutes Intravenous Every 24 hours 03/21/21 2209 03/22/21 1456   03/22/21 1800  cefTRIAXone (ROCEPHIN) 2 g in sodium chloride 0.9 % 100 mL IVPB  Status:  Discontinued        2 g 200 mL/hr over 30 Minutes Intravenous Every 24 hours 03/22/21 1456 03/26/21 1046   03/22/21 1600  vancomycin (VANCOREADY) IVPB 750 mg/150 mL  Status:  Discontinued        750  mg 150 mL/hr over 60 Minutes Intravenous Every 24 hours 03/22/21 1419 03/23/21 1018   03/21/21 2209  vancomycin variable dose per unstable renal function (pharmacist dosing)  Status:  Discontinued         Does not apply See admin instructions 03/21/21 2209 03/22/21 1425   03/21/21 1930  ceFEPIme (MAXIPIME) 2 g in sodium chloride 0.9 % 100 mL IVPB        2 g 200 mL/hr over 30 Minutes Intravenous  Once 03/21/21 1916 03/21/21 2203   03/21/21 1930  metroNIDAZOLE (FLAGYL) IVPB 500 mg        500 mg 100 mL/hr over 60 Minutes Intravenous  Once 03/21/21 1916 03/21/21 2101   03/21/21 1930  vancomycin (VANCOCIN) IVPB 1000 mg/200 mL premix        1,000 mg 200 mL/hr over 60 Minutes Intravenous  Once 03/21/21 1916 03/21/21 2253          Subjective:  Seen and examined.  She has no complaints.  Sitting in the chair.  Husband at the bedside.  Objective: Vitals:   03/30/21 0827 03/30/21 2025 03/31/21 0115 03/31/21 0440  BP:  133/80 (!) 139/97   Pulse:  93 100   Resp:  17 15   Temp:  99 F (37.2 C) 98.4 F (36.9 C)   TempSrc:  Oral Oral   SpO2: 97% 97% 96%   Weight:    62.4 kg  Height:        Intake/Output Summary (Last 24 hours) at 03/31/2021 1326 Last data filed at 03/31/2021 1208 Gross per 24 hour  Intake 360 ml  Output 1550 ml  Net -1190 ml    Filed Weights   03/29/21 0500 03/30/21 0519 03/31/21 0440  Weight: 62.3 kg 62.3 kg 62.4 kg    Examination:  General exam: Appears calm and comfortable  Respiratory system: Rhonchi bilaterally. Respiratory effort normal. Cardiovascular system: S1 & S2 heard, irregularly irregular rate and rhythm, no JVD, murmurs, rubs, gallops or clicks. No pedal edema. Gastrointestinal system: Abdomen is nondistended, soft and nontender. No organomegaly or masses felt. Normal bowel sounds heard. Central nervous system: Alert and oriented. No focal neurological deficits. Extremities: Symmetric 5 x 5 power. Skin: No rashes, lesions or ulcers.   Psychiatry: Judgement and insight appear normal. Mood & affect appropriate.   Data Reviewed: I have personally reviewed following labs and imaging studies  CBC: Recent Labs  Lab 03/25/21 0123 03/26/21 0522 03/27/21 0213 03/28/21 0226 03/29/21 0328  WBC 16.9* 19.7* 22.2* 21.2* 21.6*  NEUTROABS  --   --   --  18.8*  --   HGB 10.8* 10.8* 11.6* 10.6* 10.4*  HCT 29.3* 29.7* 32.9* 29.7* 29.4*  MCV 88.3 88.1 88.0 87.9 87.5  PLT 162 221 271 281 812    Basic Metabolic Panel: Recent Labs  Lab 03/25/21 0123 03/26/21 0522 03/27/21 1245 03/28/21 0226 03/29/21 0328 03/30/21 0217 03/31/21 0249  NA 123*   < > 121* 127* 128* 128* 128*  K 4.0   < > 4.0 4.3 5.0 4.7 4.5  CL 88*   < > 89* 94* 96* 96* 97*  CO2 20*   < > 20* 23 24 24 24   GLUCOSE 309*   < > 369* 154* 147* 115* 109*  BUN 29*   < > 29* 30* 28* 25* 22  CREATININE 1.55*   < > 1.19* 1.12* 1.12* 0.96 0.93  CALCIUM 7.9*   < > 8.3* 8.8* 9.1 9.0 8.8*  MG  --   --   --  1.8  --   --   --   PHOS 3.0  --   --   --   --   --   --    < > = values in this interval not displayed.    GFR: Estimated Creatinine Clearance: 48.2 mL/min (by C-G formula based on SCr of 0.93 mg/dL). Liver Function Tests: Recent Labs  Lab 03/25/21 0123 03/27/21 1245  AST  --  48*  ALT  --  34  ALKPHOS  --  74  BILITOT  --  0.6  PROT  --  5.8*  ALBUMIN 2.9* 2.5*    No results for input(s): LIPASE, AMYLASE in the last 168 hours. No results for input(s): AMMONIA in the last 168 hours. Coagulation Profile: No results for input(s): INR, PROTIME in the last 168 hours. Cardiac Enzymes: No results for input(s): CKTOTAL, CKMB, CKMBINDEX, TROPONINI in the last 168 hours. BNP (last 3 results) Recent Labs    03/21/21 1206  PROBNP 1,279.0*    HbA1C: No results for input(s): HGBA1C in the last 72 hours. CBG: Recent Labs  Lab 03/25/21 1946 03/25/21 2311 03/26/21 0416 03/27/21 1149 03/28/21 0735  GLUCAP 145* 147* 123* 106* 146*    Lipid  Profile: No results for input(s): CHOL, HDL, LDLCALC, TRIG, CHOLHDL, LDLDIRECT in the last 72 hours. Thyroid Function Tests: No results for input(s): TSH, T4TOTAL, FREET4, T3FREE, THYROIDAB in the last 72 hours. Anemia Panel: No results for input(s): VITAMINB12, FOLATE, FERRITIN, TIBC, IRON, RETICCTPCT in the last 72 hours. Sepsis Labs: Recent Labs  Lab 03/24/21 1523  LATICACIDVEN 1.0     Recent Results (from the past 240 hour(s))  Resp Panel by RT-PCR (Flu A&B, Covid) Nasopharyngeal Swab     Status: None   Collection Time: 03/21/21  7:15 PM   Specimen: Nasopharyngeal Swab; Nasopharyngeal(NP) swabs in vial transport medium  Result Value Ref Range Status   SARS Coronavirus 2 by RT PCR NEGATIVE NEGATIVE Final    Comment: (NOTE) SARS-CoV-2 target nucleic acids are NOT DETECTED.  The SARS-CoV-2 RNA is generally detectable in upper respiratory specimens during the acute phase of infection. The lowest concentration of SARS-CoV-2 viral copies this assay can detect is 138 copies/mL. A negative result does not preclude SARS-Cov-2 infection and should not be used as the sole basis for treatment or other patient management decisions. A negative result may occur with  improper specimen collection/handling, submission of specimen other than nasopharyngeal swab, presence of viral mutation(s) within the areas targeted by this assay, and inadequate number of viral copies(<138  copies/mL). A negative result must be combined with clinical observations, patient history, and epidemiological information. The expected result is Negative.  Fact Sheet for Patients:  EntrepreneurPulse.com.au  Fact Sheet for Healthcare Providers:  IncredibleEmployment.be  This test is no t yet approved or cleared by the [place] FDA and  has been authorized for detection and/or diagnosis of SARS-CoV-2 by FDA under an Emergency Use Authorization (EUA). This EUA will remain  in  effect (meaning this test can be used) for the duration of the COVID-19 declaration under Section 564(b)(1) of the Act, 21 U.S.C.section 360bbb-3(b)(1), unless the authorization is terminated  or revoked sooner.       Influenza A by PCR NEGATIVE NEGATIVE Final   Influenza B by PCR NEGATIVE NEGATIVE Final    Comment: (NOTE) The Xpert Xpress SARS-CoV-2/FLU/RSV plus assay is intended as an aid in the diagnosis of influenza from Nasopharyngeal swab specimens and should not be used as a sole basis for treatment. Nasal washings and aspirates are unacceptable for Xpert Xpress SARS-CoV-2/FLU/RSV testing.  Fact Sheet for Patients: EntrepreneurPulse.com.au  Fact Sheet for Healthcare Providers: IncredibleEmployment.be  This test is not yet approved or cleared by the [place] FDA and has been authorized for detection and/or diagnosis of SARS-CoV-2 by FDA under an Emergency Use Authorization (EUA). This EUA will remain in effect (meaning this test can be used) for the duration of the COVID-19 declaration under Section 564(b)(1) of the Act, 21 U.S.C. section 360bbb-3(b)(1), unless the authorization is terminated or revoked.  Performed at [facility], [address] [place], [place] [address]   Blood Culture (routine x 2)     Status: Abnormal   Collection Time: 03/21/21  7:15 PM   Specimen: Left Antecubital; Blood  Result Value Ref Range Status   Specimen Description   Final    LEFT ANTECUBITAL Performed at [facility], [address] [place], [place] [address]    Special Requests   Final    BOTTLES DRAWN AEROBIC AND ANAEROBIC Blood Culture adequate volume Performed at [facility], [address] [place], [place] [address]    Culture  Setup Time   Final    GRAM NEGATIVE RODS IN BOTH AEROBIC AND ANAEROBIC BOTTLES CRITICAL RESULT CALLED TO, READ BACK BY AND VERIFIED WITH: Andres Shad PHARMD 1407 03/22/21 A  BROWNING    Culture (A)  Final    ESCHERICHIA COLI SUSCEPTIBILITIES PERFORMED ON PREVIOUS CULTURE WITHIN THE LAST 5 DAYS. Performed at [facility], [place] [address], [place], [place] [address]    Report Status 03/24/2021 FINAL  Final  Blood Culture (routine x 2)     Status: Abnormal   Collection Time: 03/21/21  7:15 PM   Specimen: Right Antecubital; Blood  Result Value Ref Range Status   Specimen Description   Final    RIGHT ANTECUBITAL Performed at [facility], [address] [place], [place] [address]    Special Requests   Final    BOTTLES DRAWN AEROBIC AND ANAEROBIC Blood Culture adequate volume Performed at [facility], [address] [place], [place] [address]    Culture  Setup Time   Final    GRAM NEGATIVE RODS IN BOTH AEROBIC AND ANAEROBIC BOTTLES CRITICAL RESULT CALLED TO, READ BACK BY AND VERIFIED WITHAndres Shad PHARMD 1540 03/22/21 A BROWNING Performed at [facility], [place] [address] [place], [place] [address]    Culture ESCHERICHIA COLI (  A)  Final   Report Status 03/24/2021 FINAL  Final   Organism ID, Bacteria ESCHERICHIA COLI  Final      Susceptibility   Escherichia coli - MIC*    AMPICILLIN 4 SENSITIVE Sensitive     CEFAZOLIN <=4 SENSITIVE Sensitive     CEFEPIME <=0.12 SENSITIVE Sensitive     CEFTAZIDIME <=1 SENSITIVE Sensitive     CEFTRIAXONE <=0.25 SENSITIVE Sensitive     CIPROFLOXACIN <=0.25 SENSITIVE Sensitive     GENTAMICIN <=1 SENSITIVE Sensitive     IMIPENEM <=0.25 SENSITIVE Sensitive     TRIMETH/SULFA <=20 SENSITIVE Sensitive     AMPICILLIN/SULBACTAM <=2 SENSITIVE Sensitive     PIP/TAZO <=4 SENSITIVE Sensitive     * ESCHERICHIA COLI  Blood Culture ID Panel (Reflexed)     Status: Abnormal   Collection Time: 03/21/21  7:15 PM  Result Value Ref Range Status   Enterococcus faecalis NOT DETECTED NOT DETECTED Final   Enterococcus Faecium NOT DETECTED NOT DETECTED Final   Listeria monocytogenes NOT DETECTED NOT DETECTED  Final   Staphylococcus species NOT DETECTED NOT DETECTED Final   Staphylococcus aureus (BCID) NOT DETECTED NOT DETECTED Final   Staphylococcus epidermidis NOT DETECTED NOT DETECTED Final   Staphylococcus lugdunensis NOT DETECTED NOT DETECTED Final   Streptococcus species NOT DETECTED NOT DETECTED Final   Streptococcus agalactiae NOT DETECTED NOT DETECTED Final   Streptococcus pneumoniae NOT DETECTED NOT DETECTED Final   Streptococcus pyogenes NOT DETECTED NOT DETECTED Final   A.calcoaceticus-baumannii NOT DETECTED NOT DETECTED Final   Bacteroides fragilis NOT DETECTED NOT DETECTED Final   Enterobacterales DETECTED (A) NOT DETECTED Final    Comment: Enterobacterales represent a large order of gram negative bacteria, not a single organism. CRITICAL RESULT CALLED TO, READ BACK BY AND VERIFIED WITH: Andres Shad PHARMD 1407 03/22/21 A BROWNING    Enterobacter cloacae complex NOT DETECTED NOT DETECTED Final   Escherichia coli DETECTED (A) NOT DETECTED Final    Comment: CRITICAL RESULT CALLED TO, READ BACK BY AND VERIFIED WITH: Andres Shad PHARMD 1407 03/22/21 A BROWNING    Klebsiella aerogenes NOT DETECTED NOT DETECTED Final   Klebsiella oxytoca NOT DETECTED NOT DETECTED Final   Klebsiella pneumoniae NOT DETECTED NOT DETECTED Final   Proteus species NOT DETECTED NOT DETECTED Final   Salmonella species NOT DETECTED NOT DETECTED Final   Serratia marcescens NOT DETECTED NOT DETECTED Final   Haemophilus influenzae NOT DETECTED NOT DETECTED Final   Neisseria meningitidis NOT DETECTED NOT DETECTED Final   Pseudomonas aeruginosa NOT DETECTED NOT DETECTED Final   Stenotrophomonas maltophilia NOT DETECTED NOT DETECTED Final   Candida albicans NOT DETECTED NOT DETECTED Final   Candida auris NOT DETECTED NOT DETECTED Final   Candida glabrata NOT DETECTED NOT DETECTED Final   Candida krusei NOT DETECTED NOT DETECTED Final   Candida parapsilosis NOT DETECTED NOT DETECTED Final   Candida tropicalis NOT  DETECTED NOT DETECTED Final   Cryptococcus neoformans/gattii NOT DETECTED NOT DETECTED Final   CTX-M ESBL NOT DETECTED NOT DETECTED Final   Carbapenem resistance IMP NOT DETECTED NOT DETECTED Final   Carbapenem resistance KPC NOT DETECTED NOT DETECTED Final   Carbapenem resistance NDM NOT DETECTED NOT DETECTED Final   Carbapenem resist OXA 48 LIKE NOT DETECTED NOT DETECTED Final   Carbapenem resistance VIM NOT DETECTED NOT DETECTED Final    Comment: Performed at [facility], [address], [place], [place] [address]  Urine Culture     Status: None   Collection Time: 03/22/21  2:07 PM   Specimen: In/Out Cath Urine  Result Value Ref Range Status   Specimen Description IN/OUT CATH URINE  Final   Special Requests NONE  Final   Culture   Final    NO GROWTH Performed at [facility], [address], [place], [place] [address]    Report Status 03/23/2021 FINAL  Final  MRSA Next Gen by PCR, Nasal     Status: None   Collection Time: 03/22/21  3:00 PM  Result Value Ref Range Status   MRSA by PCR Next Gen NOT DETECTED NOT DETECTED Final    Comment: (NOTE) The GeneXpert MRSA Assay (FDA approved for NASAL specimens only), is one component of a comprehensive MRSA colonization surveillance program. It is not intended to diagnose MRSA infection nor to guide or monitor treatment for MRSA infections. Test performance is not FDA approved in patients less than 85 years old. Performed at [facility], [place] [address] [place], [place] [address]   Culture, blood (routine x 2)     Status: None   Collection Time: 03/23/21  5:44 PM   Specimen: BLOOD  Result Value Ref Range Status   Specimen Description BLOOD LEFT ANTECUBITAL  Final   Special Requests   Final    BOTTLES DRAWN AEROBIC AND ANAEROBIC Blood Culture results may not be optimal due to an inadequate volume of blood received in culture bottles   Culture   Final    NO GROWTH 5 DAYS Performed at [facility], [place] [address], [place], [place] [address]    Report Status 03/28/2021 FINAL  Final  Culture, blood (routine x 2)     Status: None   Collection Time: 03/23/21  5:44 PM   Specimen: BLOOD LEFT HAND  Result Value Ref Range Status   Specimen Description BLOOD LEFT HAND  Final   Special Requests   Final    BOTTLES DRAWN AEROBIC ONLY Blood Culture adequate volume   Culture   Final    NO GROWTH 5 DAYS Performed at [facility], [place] [address] [place], [place] [address]    Report Status 03/28/2021 FINAL  Final  Culture, blood (routine x 2)     Status: None   Collection Time: 03/24/21  9:07 AM   Specimen: BLOOD RIGHT HAND  Result Value Ref Range Status   Specimen Description BLOOD RIGHT HAND  Final   Special Requests   Final    AEROBIC BOTTLE ONLY Blood Culture results may not be optimal due to an inadequate volume of blood received in culture bottles   Culture   Final    NO GROWTH 5 DAYS Performed at [facility], [place] [address] [place], [place] [address]    Report Status 03/29/2021 FINAL  Final  Culture, blood (routine x 2)     Status: None   Collection Time: 03/24/21  9:17 AM   Specimen: BLOOD LEFT HAND  Result Value Ref Range Status   Specimen Description BLOOD LEFT HAND  Final   Special Requests   Final    BOTTLES DRAWN AEROBIC AND ANAEROBIC Blood Culture adequate volume   Culture   Final    NO GROWTH 5 DAYS Performed at [facility], [place] [address] [place],  [address]    Report Status 03/29/2021 FINAL  Final  Respiratory (~20 pathogens) panel by PCR     Status: None   Collection Time: 03/27/21 11:35 AM   Specimen: Nasopharyngeal Swab;  Respiratory  Result Value Ref Range Status   Adenovirus NOT DETECTED NOT DETECTED Final   Coronavirus 229E NOT DETECTED NOT DETECTED Final    Comment: (NOTE) The Coronavirus on the Respiratory Panel, DOES NOT test for the novel  Coronavirus (2019 nCoV)    Coronavirus HKU1 NOT DETECTED NOT DETECTED Final   Coronavirus NL63  NOT DETECTED NOT DETECTED Final   Coronavirus OC43 NOT DETECTED NOT DETECTED Final   Metapneumovirus NOT DETECTED NOT DETECTED Final   Rhinovirus / Enterovirus NOT DETECTED NOT DETECTED Final   Influenza A NOT DETECTED NOT DETECTED Final   Influenza B NOT DETECTED NOT DETECTED Final   Parainfluenza Virus 1 NOT DETECTED NOT DETECTED Final   Parainfluenza Virus 2 NOT DETECTED NOT DETECTED Final   Parainfluenza Virus 3 NOT DETECTED NOT DETECTED Final   Parainfluenza Virus 4 NOT DETECTED NOT DETECTED Final   Respiratory Syncytial Virus NOT DETECTED NOT DETECTED Final   Bordetella pertussis NOT DETECTED NOT DETECTED Final   Bordetella Parapertussis NOT DETECTED NOT DETECTED Final   Chlamydophila pneumoniae NOT DETECTED NOT DETECTED Final   Mycoplasma pneumoniae NOT DETECTED NOT DETECTED Final    Comment: Performed at [facility], [place]. [address], [place], [place] [address]       Radiology Studies: DG CHEST PORT 1 VIEW  Result Date: 03/30/2021 CLINICAL DATA:  Atrial fibrillation with short of breath EXAM: PORTABLE CHEST 1 VIEW COMPARISON:  03/28/2021 FINDINGS: Improved bilateral airspace disease likely due to clearing pneumonia or edema. Underlying COPD. Small pleural effusions bilaterally. Dual lead pacemaker unchanged. IMPRESSION: Interval improvement in bilateral airspace disease due to clearing edema or pneumonia. Small bilateral effusions. Electronically Signed   By: Franchot Gallo M.D.   On: 03/30/2021 15:11   US PELVIC COMPLETE WITH TRANSVAGINAL  Result Date: 03/29/2021 CLINICAL DATA:  Pelvic mass EXAM: TRANSABDOMINAL AND TRANSVAGINAL ULTRASOUND OF PELVIS TECHNIQUE: Both transabdominal and transvaginal ultrasound examinations of the pelvis were performed. Transabdominal technique was performed for global imaging of the pelvis including uterus, ovaries, adnexal regions, and pelvic cul-de-sac. It was necessary to proceed with endovaginal exam following the transabdominal exam to  visualize the uterus endometrium ovaries. COMPARISON:  CT 03/26/2021 FINDINGS: Uterus Measurements: 6.6 x 3.5 x 3.7 cm = volume: 44.9 mL. No fibroids or other mass visualized. Endometrium Thickness: 3.5 mm. Small amount of fluid in the endometrial canal. Hypoechoic nodule measuring 7 by 3 x 4 mm. Right ovary Not visualized.  Dilated pelvic vessels. Left ovary Meaurements: 2.9 x 1.4 x 1.1 cm Volume:2.4 mL. No adnexal mass is seen. There is no cystic structure visible in the left adnexa. There are dilated pelvic vessels. Other findings None IMPRESSION: 1. The described cystic lesion at the left adnexa is not seen by sonography. 2. Small amount of fluid in the endometrial canal with suspected 7 mm endometrial nodule. Gynecology consultation with possible tissue sampling is recommended. 3. Dilated left greater than right pelvic vessels. Electronically Signed   By: Donavan Foil M.D.   On: 03/29/2021 16:09    Scheduled Meds:  amiodarone  200 mg Oral BID   apixaban  5 mg Oral BID   aspirin EC  81 mg Oral Daily   bisacodyl  10 mg Rectal Once   diltiazem  360 mg Oral Daily   folic acid  1 mg Oral Daily   gabapentin  300 mg Oral TID   mouth rinse  15 mL Mouth Rinse BID   metoprolol tartrate  25 mg Oral BID  polyethylene glycol  17 g Oral BID   predniSONE  40 mg Oral Q breakfast   senna-docusate  1 tablet Oral BID   sodium chloride flush  10-40 mL Intracatheter Q12H   sodium chloride  1 g Oral Q4H   Continuous Infusions:  sodium chloride Stopped (03/28/21 1756)     LOS: 9 days   Time spent: 29 minutes   Darliss Cheney, MD [facility]  03/31/2021, 1:26 PM  Please page via [place] and do not message via secure chat for anything urgent. Secure chat can be used for anything non urgent.  How to contact the [facility] Attending or Consulting provider [place] or covering provider during after hours [place], for this patient?  Check the care team in [facility] and look for a) attending/consulting TRH provider  listed and b) the [facility] team listed. Page or secure chat [phone]. Log into [email or web address] and use [place]'s universal password to access. If you do not have the password, please contact the hospital operator. Locate the [facility] provider you are looking for under [facility] and page to a number that you can be directly reached. If you still have difficulty reaching the provider, please page the [facility] (Director on Call) for the Hospitalists listed on amion for assistance.

## 2021-04-01 DIAGNOSIS — R7881 Bacteremia: Secondary | ICD-10-CM

## 2021-04-01 DIAGNOSIS — B9620 Unspecified Escherichia coli [E. coli] as the cause of diseases classified elsewhere: Secondary | ICD-10-CM

## 2021-04-01 LAB — BASIC METABOLIC PANEL
Anion gap: 8 (ref 5–15)
BUN: 23 mg/dL (ref 8–23)
CO2: 25 mmol/L (ref 22–32)
Calcium: 8.9 mg/dL (ref 8.9–10.3)
Chloride: 96 mmol/L — ABNORMAL LOW (ref 98–111)
Creatinine, Ser: 0.92 mg/dL (ref 0.44–1.00)
GFR, Estimated: >60 mL/min (ref >60)
Glucose, Bld: 109 mg/dL — ABNORMAL HIGH (ref 70–99)
Potassium: 4.2 mmol/L (ref 3.5–5.1)
Sodium: 129 mmol/L — ABNORMAL LOW (ref 135–145)

## 2021-04-01 MED ORDER — FUROSEMIDE 20 MG PO TABS: 20.0000 mg | ORAL_TABLET | Freq: Every day | ORAL | 0 refills | Status: DC

## 2021-04-01 MED ORDER — DILTIAZEM HCL ER COATED BEADS 360 MG PO CP24: 360.0000 mg | ORAL_CAPSULE | Freq: Every day | ORAL | 0 refills | Status: DC

## 2021-04-01 MED ORDER — METOPROLOL TARTRATE 25 MG PO TABS: 25.0000 mg | ORAL_TABLET | Freq: Two times a day (BID) | ORAL | 0 refills | Status: DC

## 2021-04-01 MED ORDER — AMIODARONE HCL 200 MG PO TABS: 200.0000 mg | ORAL_TABLET | Freq: Two times a day (BID) | ORAL | 0 refills | Status: DC

## 2021-04-01 MED ORDER — FUROSEMIDE 20 MG PO TABS
20.0000 mg | ORAL_TABLET | Freq: Every day | ORAL | Status: DC
  Administered 2021-04-01: 20 mg via ORAL
  Filled 2021-04-01: qty 1

## 2021-04-01 MED ORDER — APIXABAN 5 MG PO TABS: 5.0000 mg | ORAL_TABLET | Freq: Two times a day (BID) | ORAL | 0 refills | Status: DC

## 2021-04-01 MED ORDER — PREDNISONE 10 MG PO TABS: ORAL_TABLET | ORAL | 0 refills | Status: DC

## 2021-04-01 NOTE — Progress Notes (Signed)
SATURATION QUALIFICATIONS: (This note is used to comply with regulatory documentation for home oxygen)  Patient Saturations on Room Air at Rest = 95%  Patient Saturations on Room Air while Ambulating = 90%  Patient Saturations on 2 Liters of oxygen while Ambulating = 92%

## 2021-04-01 NOTE — TOC Transition Note (Addendum)
Transition of Care [facility]) - CM/SW Discharge Note   Patient Details  Name: Sheila Morales MRN: [id number] Date of Birth: 1951-03-26  Transition of Care [facility] CM/SW Contact:  Carles Collet, RN Phone Number: 04/01/2021, 9:27 AM   Clinical Narrative:    Sheila Morales w patient at bedside. She states that she does not want rollator, she has RW, 3/1 at home and will use those. [place] services accepted by [place] with [facility] tomorrow. Provided with 30 day Eliquis card No other CM needs identified.     Final next level of care: [place] Barriers to Discharge: No Barriers Identified   Patient Goals and CMS Choice Patient states their goals for this hospitalization and ongoing recovery are:: To go home CMS Medicare.gov Compare Post Acute Care list provided to:: Patient Choice offered to / list presented to : Patient  Discharge Placement                       Discharge Plan and Services   Discharge Planning Services: CM Consult            DME Arranged: N/A         HH Arranged: PT, OT HH Agency: [place] Date [place]: 04/01/21 Time [place]: [phone] Representative spoke with at [place]: AMY  Social Determinants of Health ([place]) Interventions     Readmission Risk Interventions No flowsheet data found.

## 2021-04-01 NOTE — Progress Notes (Signed)
Cardiology Progress Note  Patient ID: Sheila Morales MRN: [id number] DOB: [birth year] Date of Encounter: 04/01/2021  Primary Cardiologist: Elouise Munroe, MD  Subjective   Chief Complaint: None.   HPI: Heart rates improved with addition of metoprolol.  Good diuresis.  Ready to go home.  ROS:  All other ROS reviewed and negative. Pertinent positives noted in the HPI.     Inpatient Medications  Scheduled Meds:  amiodarone  200 mg Oral BID   apixaban  5 mg Oral BID   aspirin EC  81 mg Oral Daily   bisacodyl  10 mg Rectal Once   diltiazem  360 mg Oral Daily   folic acid  1 mg Oral Daily   furosemide  20 mg Oral Daily   gabapentin  300 mg Oral TID   mouth rinse  15 mL Mouth Rinse BID   metoprolol tartrate  25 mg Oral BID   polyethylene glycol  17 g Oral BID   predniSONE  40 mg Oral Q breakfast   senna-docusate  1 tablet Oral BID   sodium chloride flush  10-40 mL Intracatheter Q12H   sodium chloride  1 g Oral Q4H   Continuous Infusions:  sodium chloride Stopped (03/28/21 1756)   PRN Meds: sodium chloride, acetaminophen **OR** acetaminophen, benzonatate, labetalol, levalbuterol, lip balm, morphine injection, nitroGLYCERIN, ondansetron **OR** ondansetron (ZOFRAN) IV, oxyCODONE, sodium chloride flush   Vital Signs   Vitals:   04/01/21 0359 04/01/21 0400 04/01/21 0401 04/01/21 0715  BP:  132/86  130/78  Pulse: 98 97 100 99  Resp: 15 17 17 19   Temp:  97.8 F (36.6 C)  97.9 F (36.6 C)  TempSrc:  Oral  Oral  SpO2: 91% 92% 91% 94%  Weight:  62.1 kg    Height:        Intake/Output Summary (Last 24 hours) at 04/01/2021 0747 Last data filed at 04/01/2021 0655 Gross per 24 hour  Intake 480 ml  Output 2150 ml  Net -1670 ml   Last 3 Weights 04/01/2021 03/31/2021 03/30/2021  Weight (lbs) 137 lb 137 lb 9.6 oz 137 lb 5.6 oz  Weight (kg) 62.143 kg 62.415 kg 62.3 kg      Telemetry  Overnight telemetry shows A. fib 90-110 bpm, which I personally reviewed.   Physical Exam    Vitals:   04/01/21 0359 04/01/21 0400 04/01/21 0401 04/01/21 0715  BP:  132/86  130/78  Pulse: 98 97 100 99  Resp: 15 17 17 19   Temp:  97.8 F (36.6 C)  97.9 F (36.6 C)  TempSrc:  Oral  Oral  SpO2: 91% 92% 91% 94%  Weight:  62.1 kg    Height:        Intake/Output Summary (Last 24 hours) at 04/01/2021 0747 Last data filed at 04/01/2021 0655 Gross per 24 hour  Intake 480 ml  Output 2150 ml  Net -1670 ml    Last 3 Weights 04/01/2021 03/31/2021 03/30/2021  Weight (lbs) 137 lb 137 lb 9.6 oz 137 lb 5.6 oz  Weight (kg) 62.143 kg 62.415 kg 62.3 kg    Body mass index is 25.06 kg/m.   General: Well nourished, well developed, in no acute distress Head: Atraumatic, normal size  Eyes: PEERLA, EOMI  Neck: Supple, no JVD Endocrine: No thryomegaly Cardiac: Normal S1, S2; irregular rhythm, no murmurs Lungs: Clear to auscultation bilaterally, no wheezing, rhonchi or rales  Abd: Soft, nontender, no hepatomegaly  Ext: No edema, pulses 2+ Musculoskeletal: No deformities, BUE and BLE  strength normal and equal Skin: Warm and dry, no rashes   Neuro: Alert and oriented to person, place, time, and situation, CNII-XII grossly intact, no focal deficits  Psych: Normal mood and affect   Labs  High Sensitivity Troponin:  No results for input(s): TROPONINIHS in the last 720 hours.   Cardiac EnzymesNo results for input(s): TROPONINI in the last 168 hours. No results for input(s): TROPIPOC in the last 168 hours.  Chemistry Recent Labs  Lab 03/27/21 1245 03/28/21 0226 03/30/21 0217 03/31/21 0249 04/01/21 0210  NA 121*   < > 128* 128* 129*  K 4.0   < > 4.7 4.5 4.2  CL 89*   < > 96* 97* 96*  CO2 20*   < > 24 24 25   GLUCOSE 369*   < > 115* 109* 109*  BUN 29*   < > 25* 22 23  CREATININE 1.19*   < > 0.96 0.93 0.92  CALCIUM 8.3*   < > 9.0 8.8* 8.9  PROT 5.8*  --   --   --   --   ALBUMIN 2.5*  --   --   --   --   AST 48*  --   --   --   --   ALT 34  --   --   --   --   ALKPHOS 74  --   --    --   --   BILITOT 0.6  --   --   --   --   GFRNONAA 49*   < > >60 >60 >60  ANIONGAP 12   < > 8 7 8    < > = values in this interval not displayed.    Hematology Recent Labs  Lab 03/27/21 0213 03/28/21 0226 03/29/21 0328  WBC 22.2* 21.2* 21.6*  RBC 3.74* 3.38* 3.36*  HGB 11.6* 10.6* 10.4*  HCT 32.9* 29.7* 29.4*  MCV 88.0 87.9 87.5  MCH 31.0 31.4 31.0  MCHC 35.3 35.7 35.4  RDW 14.6 14.8 15.1  PLT 271 281 343   BNP Recent Labs  Lab 03/28/21 0226  BNP 680.4*    DDimer No results for input(s): DDIMER in the last 168 hours.   Radiology  DG CHEST PORT 1 VIEW  Result Date: 03/30/2021 CLINICAL DATA:  Atrial fibrillation with short of breath EXAM: PORTABLE CHEST 1 VIEW COMPARISON:  03/28/2021 FINDINGS: Improved bilateral airspace disease likely due to clearing pneumonia or edema. Underlying COPD. Small pleural effusions bilaterally. Dual lead pacemaker unchanged. IMPRESSION: Interval improvement in bilateral airspace disease due to clearing edema or pneumonia. Small bilateral effusions. Electronically Signed   By: Franchot Gallo M.D.   On: 03/30/2021 15:11    Cardiac Studies  TTE 03/22/2021  1. Left ventricular ejection fraction, by estimation, is 70 to 75%. The  left ventricle has hyperdynamic function. The left ventricle has no  regional wall motion abnormalities. There is mild left ventricular  hypertrophy. Left ventricular diastolic  parameters are consistent with Grade II diastolic dysfunction  (pseudonormalization). Elevated left atrial pressure.   2. Right ventricular systolic function is normal. The right ventricular  size is normal. There is mildly elevated pulmonary artery systolic  pressure.   3. Left atrial size was mildly dilated.   4. The mitral valve is normal in structure. Mild mitral valve  regurgitation. No evidence of mitral stenosis.   5. The aortic valve is tricuspid. Aortic valve regurgitation is not  visualized. Aortic valve sclerosis/calcification is  present, without any  evidence of aortic stenosis.   6. The inferior vena cava is normal in size with greater than 50%  respiratory variability, suggesting right atrial pressure of 3 mmHg.   Patient Profile  Sheila Morales is a 70 y.o. female with COPD, lupus on methotrexate, hypertension, diastolic heart failure, sick sinus syndrome status post pacemaker implantation, diastolic heart failure who was admitted on 03/22/2021 with acute on chronic diastolic heart failure as well as community-acquired pneumonia/UTI.  She was also admitted with severe hyponatremia.  Assessment & Plan   #Acute on chronic diastolic heart failure -Euvolemic on exam.  We will transition to 20 mg of p.o. Lasix today. -Sodium is a mixture of heart failure as well as SIADH per prior evaluations.  #New onset A. Fib -Triggered by acute illness including community-acquired pneumonia as well as pyelonephritis. -Rates are much better with the addition of metoprolol.  Would continue diltiazem extended release 360 daily.  Continue metoprolol tartrate 25 mg twice daily.  To me her rates are much improved today.  Can be discharged on this regimen. -Continue Eliquis 5 mg twice daily. -We will arrange outpatient follow-up for cardioversion.  #Acute hypoxic respiratory failure -Euvolemic.  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  Also has improved with diuresis. -Continue fluid restriction.  [facility] will sign off.   Medication Recommendations: As above Other recommendations (labs, testing, etc): None. Follow up as an outpatient: We will arrange outpatient follow-up in 1-2 weeks.   For questions or updates, please contact [place] Please consult [email or web address] for contact info under    Time Spent with Patient: I have spent a total of 15 minutes with patient reviewing hospital notes, telemetry, EKGs, labs and examining the patient as well as establishing an assessment and plan that  was discussed with the patient.  > 50% of time was spent in direct patient care.    Signed, Addison Naegeli. Audie Box, MD, [place]  04/01/2021 7:47 AM

## 2021-04-01 NOTE — Discharge Summary (Signed)
Physician Discharge Summary  Sheila Morales BLT:[id number] DOB: 06-01-1950 DOA: 03/21/2021  PCP: Hoyt Koch, MD  Admit date: 03/21/2021 Discharge date: 04/01/2021 30 Day Unplanned Readmission Risk Score    Flowsheet Row ED to Hosp-Admission (Current) from 03/21/2021 in [facility]  30 Day Unplanned Readmission Risk Score (%) 16.19 Filed at 04/01/2021 0801       This score is the patient's risk of an unplanned readmission within 30 days of being discharged (0 -100%). The score is based on dignosis, age, lab data, medications, orders, and past utilization.   Low:  0-14.9   Medium: 15-21.9   High: 22-29.9   Extreme: 30 and above          Admitted From: Home Disposition: Home  Recommendations for Outpatient Follow-up:  Follow up with PCP in 1-2 weeks Please obtain BMP/CBC in one week Follow-up with your primary cardiologist in 2 to 3 weeks Please follow up with your PCP on the following pending results: Unresulted Labs (From admission, onward)    None         Home Health: Yes Equipment/Devices: None  Discharge Condition: Stable CODE STATUS: Full code Diet recommendation: Cardiac  Subjective: Seen and examined.  Feels much better.  She has no complaints.  She wants to go home today.  Husband at the bedside also in agreement.  Brief/Interim Summary: Sheila Morales is a 70 y.o. with a past medical history significant for COPD, HFpEF, systemic lupus erythematosus, and ischemic colitis who presented to the emergency department 11/30 for complaints of dyspnea, fatigue, weakness, and altered mental status.  Per husband symptoms began 4 days prior to admission with a worrisome symptom of cough and malaise.  States he attempted to seek medical treatment for patient at local ER/urgent care but t when the wait time exceeded 11 hours they decided to leave and not be seen.  Admitted to [facility] with diastolic HF exacerbation, AKI, and hyponatremia.  Found to have  e.coli bacteremia.   Morning of 12/3 patent has again developed worsening mentation with A-fib RVR with recurrent low-grade fever, tachycardia, and hypertension.  PCCM consulted for further management and transfer to ICU   Significant Hospital Events: Including procedures, antibiotic start and stop dates in addition to other pertinent events   11/30 admitted for generalized fatigue, weakness, and altered mental status found to be bacteremic with E. Coli and UTI.  Patient was also with several metabolic derangements including severely hyponatremic with a sodium of 117 12/1 Nephrology and Cardiology consulted for AKI/ hyponatremia and and HF exacerbation/ AF w/ RVR 12/2 ID consult 12/3 transfer to ICU for acute encephalopathy and AF/RVR 12/4 Came off BiPAP this morning. Napping. Still lethargic. No abdominal pain 12/6 Off BiPAP this am, some abdominal muscle use noted, on 4 L HFNC with sats of 92% 12/8: Transferred to [facility]   11/30 SARS/ flu > neg 11/30 Bcx2 > pansensitive e.coli 12/1 MRSA PCR > neg 12/1 UC  > neg 12/2 Bcx 2 > ngtd 12/3 Bcx 2 > ngtd   11/30 cefepime 11/30 flagyl 11/30 vanc > 12/1 12/1 ceftriaxone >       Acute Hypoxic and hypercapnic Respiratory Failure: secondary to possible CAP versus pulmonary edema.  She is daily smoker.  Chest CT on admission negative for signs of acute pneumonia revealed mild emphysema with questionable mild degree of fibrosis and cardiomegaly, hx of SLE Subsequent CT Chest 12/5>> Extensive upper lobe GG pulmonary infiltrate, suspect infectious.  She required  BiPAP in the ICU.  Currently she is on 3 L and saturating 94%. continue with xopenex/ atrovent q6hr with prn albuterol.  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 urine legionella negative.  Was started on prednisone 40 mg and pulmonary recommended slow tapering.  She received 5 days of 40 mg, I am discharging her on slow tapering over the next 15 days.  Follow-up with  pulmonology.   E. Coli bacteremia secondary to pyelonephritis: CT Abdomen pelvis with moderate asymmetric right perinephric Stranding, concerning for pyelonephritis. Felt secondary to UTI in immunocompromised patient although UC neg (but was after abx) P: ID following, neg TTE 12/1, completed cefazolin on 03/30/2021.  ID had recommended TEE however cardiology on board did not feel the need for that and they are going to arrange outpatient follow-up for possible cardioversion and will likely do TEE.   Acute on chronic diastolic heart failure: ECHO 12/1 with EF 70-75 and grade II diastolic dysfunction.  Still has rhonchi, chest x-ray yesterday shows improvement.  She is on room air at rest, checking ambulatory oximetry.  Cardiology recommended discharging on Lasix 20 mg p.o. daily.   HTN: Controlled   New onset A. fib RVR: Likely secondary to acute illness.  Patient was transition from IV amiodarone to oral amiodarone 03/28/2021, and was transitioned from IV diltiazem to long-acting oral diltiazem 360 mg p.o. daily on 03/30/2021.  Despite of this, patient's heart rate still poorly controlled so Toprol-XL was added on 03/31/2021, heart rates much better now, seen by cardiology and cleared to go home.  We will continue Eliquis, they will schedule outpatient follow-up and possible TEE cardioversion.  History of sick sinus syndrome s/p permanent pacemaker: Cardiology following, appreciate assistance   Acute metabolic encephalopathy: Resolved.  She is fully alert and oriented.   Acute Kidney Injury, non-oliguric.  Creatinine peaked at 2.11.  Now resolved.  Baseline creatinine 0.7-0.9.   -Acute on chronic hyponatremia: Lowest was 113 during this hospitalization, currently 129.  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  TSH normal.  They recommended continuing fluid restriction and added salt tablets.   Hx of SLE -Home medications include Plaquenil  and Methotrexate  Medication were held while inpatient due to bacteremia but she will resume now.   Normocytic anemia: Hemoglobin is stable.   CT  Abdomen pelvis with 5.1 cm indeterminate cystic lesion within the left adnexa.  This was not visible on pelvic ultrasound.  However there is small amount of fluid in endometrial canal with suspected 7 mm endometrial nodule and tissue sampling is recommended as outpatient with gynecology.   Discharge Diagnoses:  Principal Problem:   Acute on chronic diastolic heart failure (HCC) Active Problems:   Tobacco use   Irritable bowel syndrome   History of rheumatic fever as a child   COPD (chronic obstructive pulmonary disease) (HCC)   Hyponatremia   AKI (acute kidney injury) ([place])   Acute respiratory failure with hypoxia and hypercapnia (HCC)   Community acquired pneumonia   Hypertension   Pacemaker   Pulmonary vascular congestion   Acute metabolic encephalopathy   New onset atrial fibrillation (HCC)   Chronic anticoagulation   E coli bacteremia    Discharge Instructions   Allergies as of 04/01/2021   No Known Allergies      Medication List     STOP taking these medications    doxycycline 100 MG tablet Commonly known  as: VIBRA-TABS   ibuprofen 200 MG tablet Commonly known as: ADVIL   telmisartan 40 MG tablet Commonly known as: MICARDIS       TAKE these medications    Advanced Joint Relief Caps Take 1 capsule by mouth daily. Joint Advantage Gold 5x   albuterol 108 (90 Base) MCG/ACT inhaler Commonly known as: VENTOLIN HFA Inhale 1-2 puffs into the lungs every 6 (six) hours as needed for wheezing or shortness of breath.   amiodarone 200 MG tablet Commonly known as: PACERONE Take 1 tablet (200 mg total) by mouth 2 (two) times daily.   Anoro Ellipta 62.5-25 MCG/ACT Aepb Generic drug: umeclidinium-vilanterol INHALE 1 PUFF INTO THE LUNGS DAILY. What changed: when to take this   apixaban 5 MG Tabs tablet Commonly  known as: ELIQUIS Take 1 tablet (5 mg total) by mouth 2 (two) times daily.   aspirin 81 MG tablet Take 81 mg by mouth at bedtime.   benzonatate 200 MG capsule Commonly known as: TESSALON Take 1 capsule (200 mg total) by mouth every 6 (six) hours as needed for cough.   BLACK COHOSH PO Take 1 capsule by mouth daily.   calcium carbonate 1250 (500 Ca) MG tablet Commonly known as: OS-CAL - dosed in mg of elemental calcium Take 1 tablet by mouth daily as needed (heartburn).   diltiazem 360 MG 24 hr capsule Commonly known as: CARDIZEM CD Take 1 capsule (360 mg total) by mouth daily. Start taking on: April 03, 239   folic acid 1 MG tablet Commonly known as: FOLVITE Take 1 mg by mouth daily.   furosemide 20 MG tablet Commonly known as: LASIX Take 1 tablet (20 mg total) by mouth daily. Start taking on: April 02, 2021 What changed:  medication strength how much to take   gabapentin 300 MG capsule Commonly known as: NEURONTIN TAKE TWO CAPSULES BY MOUTH EVERY MORNING, TWO CAPSULES EACH AFTERNOON AND THREE CAPSULES AT BEDTIME What changed: See the new instructions.   hydroxychloroquine 200 MG tablet Commonly known as: PLAQUENIL Take 200 mg by mouth daily.   methotrexate 2.5 MG tablet Take 10 mg by mouth every Thursday.   metoprolol tartrate 25 MG tablet Commonly known as: LOPRESSOR Take 1 tablet (25 mg total) by mouth 2 (two) times daily.   multivitamin with minerals Tabs tablet Take 1 tablet by mouth daily.   OMEGA 3 PO Take 1 capsule by mouth daily.   OVER THE COUNTER MEDICATION Take 1 capsule by mouth daily. brain essentials otc supplement   predniSONE 10 MG tablet Commonly known as: DELTASONE Take 30 mg (3 tabs) po daily for 5 days, followed by 20 mg (2 tabs) po daily for 5 days, followed by 10 mg po daily for 5 days.   Restasis 0.05 % ophthalmic emulsion Generic drug: cycloSPORINE Place 1 drop into both eyes 2 (two) times daily.   vitamin C 500 MG  tablet Commonly known as: ASCORBIC ACID Take 500 mg by mouth daily.   Vitamin D 50 MCG (2000 UT) tablet Take 2,000 Units by mouth daily.   vitamin E 180 MG (400 UNITS) capsule Take 400 Units by mouth daily.   zolpidem 5 MG tablet Commonly known as: AMBIEN Take 1 tablet (5 mg total) by mouth at bedtime as needed for sleep.               Durable Medical Equipment  (From admission, onward)           Start     Ordered  03/31/21 0808  For home use only DME Walker rolling  Once       Question Answer Comment  Walker: With 5 Inch Wheels   Patient needs a walker to treat with the following condition Balance problem      03/31/21 0808            Follow-up Information     Elouise Munroe, MD Follow up.   Specialties: Cardiology, Radiology Why: our office will call you Monday or Tuesday with follow up appointment.  If you have not heard from [place] by Wed then please call the office for an appt in 1-2 weeks. Contact information: [address] [place] [address] [phone]         Hoyt Koch, MD Follow up in 1 week(s).   Specialty: Internal Medicine Contact information: [place] [address] [phone]         Elouise Munroe, MD .   Specialties: Cardiology, Radiology Contact information: [address] [place] [address] [place] [address] [place], [facility] Follow up.   Specialty: Home Health Services Why: for home health services Contact information: [place] [address] [phone]                No Known Allergies  Consultations: Cardiology, PCCM, ID   Procedures/Studies: DG Chest 2 View  Result Date: 03/19/2021 CLINICAL DATA:  Shortness of breath, cough, and fatigue for few days EXAM: CHEST - 2 VIEW COMPARISON:  05/26/2020 FINDINGS: LEFT subclavian sequential transvenous pacemaker leads project at RIGHT atrium and RIGHT ventricle.  Enlargement of cardiac silhouette with pulmonary vascular congestion. Mild chronic accentuation of interstitial markings similar to prior exam. Minimal subsegmental atelectasis at lung bases. No definite acute infiltrate, pleural effusion, or pneumothorax. Minimal atherosclerotic calcification aorta. Bones demineralized. IMPRESSION: Enlargement of cardiac silhouette with pulmonary vascular congestion. Chronic accentuation of interstitial markings with minimal bibasilar atelectasis. Aortic Atherosclerosis (ICD10-I70.0). Electronically Signed   By: Lavonia Dana M.D.   On: 03/19/2021 13:04   CT Chest Wo Contrast  Result Date: 03/21/2021 CLINICAL DATA:  Respiratory illness EXAM: CT CHEST WITHOUT CONTRAST TECHNIQUE: Multidetector CT imaging of the chest was performed following the standard protocol without IV contrast. COMPARISON:  Chest x-ray 03/21/2021, chest CT 11/04/2017 FINDINGS: Cardiovascular: Limited evaluation without intravenous contrast. Partially visualized intracardiac pacing leads. Cardiomegaly with coronary vascular calcifications. No pericardial effusion. Moderate aortic atherosclerosis. No aneurysm Mediastinum/Nodes: Midline trachea. No thyroid mass. Multiple small left supraclavicular lymph nodes. Slightly enlarged precarinal lymph node measuring 13 mm. Esophagus within normal limits. Lungs/Pleura: Mild emphysema. Mild subpleural reticulation likely due to fibrosis. Bandlike density in the left lung base consistent with atelectasis or scar. No acute consolidation, pleural effusion or pneumothorax. Upper Abdomen: No acute abnormality. Musculoskeletal: No chest wall mass or suspicious bone lesions identified. IMPRESSION: 1. Negative for acute airspace disease or pneumonia 2. Mild emphysema. Mild subpleural reticulation, consistent with mild degree of fibrosis. 3. Cardiomegaly 4. Mild mediastinal lymph nodes which are nonspecific and may be reactive. Aortic Atherosclerosis (ICD10-I70.0) and Emphysema  (ICD10-J43.9). Electronically Signed   By: Donavan Foil M.D.   On: 03/21/2021 22:12   US RENAL  Result Date: 03/23/2021 CLINICAL DATA:  Acute kidney injury. EXAM: RENAL / URINARY TRACT ULTRASOUND COMPLETE COMPARISON:  March 21, 2004 FINDINGS: Right Kidney: Renal measurements: 12.9 cm x 5.1 cm x 4.2 cm = volume: 145.59 mL. Echogenicity within normal limits. No mass  or hydronephrosis visualized. Left Kidney: Renal measurements: 13.3 cm x 4.7 cm x 4.5 cm = volume: 147.69 mL. Echogenicity within normal limits. No mass or hydronephrosis visualized. Bladder: Appears normal for degree of bladder distention. Other: None. IMPRESSION: Normal renal ultrasound. Electronically Signed   By: Virgina Norfolk M.D.   On: 03/23/2021 01:49   DG CHEST PORT 1 VIEW  Result Date: 03/30/2021 CLINICAL DATA:  Atrial fibrillation with short of breath EXAM: PORTABLE CHEST 1 VIEW COMPARISON:  03/28/2021 FINDINGS: Improved bilateral airspace disease likely due to clearing pneumonia or edema. Underlying COPD. Small pleural effusions bilaterally. Dual lead pacemaker unchanged. IMPRESSION: Interval improvement in bilateral airspace disease due to clearing edema or pneumonia. Small bilateral effusions. Electronically Signed   By: Franchot Gallo M.D.   On: 03/30/2021 15:11   DG CHEST PORT 1 VIEW  Result Date: 03/28/2021 CLINICAL DATA:  Pneumonia. EXAM: PORTABLE CHEST 1 VIEW COMPARISON:  CT chest dated March 26, 2021. Chest x-ray dated March 25, 2021. FINDINGS: Unchanged left chest wall pacemaker and cardiomegaly. Patchy bilateral airspace disease in both lungs appears improved in the left upper lobe, but not significantly changed on the right. No pleural effusion or pneumothorax. No acute osseous abnormality. IMPRESSION: 1. Multifocal pneumonia, improved in the left upper lobe. Electronically Signed   By: Titus Dubin M.D.   On: 03/28/2021 08:40   DG Chest Port 1 View  Result Date: 03/25/2021 CLINICAL DATA:  Respiratory  distress. EXAM: PORTABLE CHEST 1 VIEW COMPARISON:  Chest x-rays dated 03/24/2021 and 03/23/2021. FINDINGS: 0354 hours. Patchy bilateral airspace opacities are stable, again most confluent within the RIGHT upper lung. No pleural effusion or pneumothorax is seen. Heart size and mediastinal contours are stable. Pacer pad overlies the LEFT heart border. LEFT chest wall pacemaker/ICD apparatus appears stable in position. IMPRESSION: Stable chest x-ray. Patchy bilateral airspace opacities are stable, again most confluent within the RIGHT upper lung, compatible with multifocal pneumonia and/or pulmonary edema. Electronically Signed   By: Franki Cabot M.D.   On: 03/25/2021 07:59   DG CHEST PORT 1 VIEW  Result Date: 03/24/2021 CLINICAL DATA:  Shortness of breath EXAM: PORTABLE CHEST 1 VIEW COMPARISON:  03/23/2021 FINDINGS: Persistent bilateral pulmonary opacities. Similar lung aeration. No significant pleural effusion. No pneumothorax. Stable mild cardiomegaly. IMPRESSION: Persistent bilateral pulmonary opacities reflecting pulmonary edema or multifocal pneumonia. Electronically Signed   By: Macy Mis M.D.   On: 03/24/2021 11:07   DG CHEST PORT 1 VIEW  Result Date: 03/23/2021 CLINICAL DATA:  Shortness of breath EXAM: PORTABLE CHEST 1 VIEW COMPARISON:  03/21/2021 FINDINGS: Moderate interstitial pulmonary edema, greatest in the right upper lobe. Mild cardiomegaly. No pneumothorax. IMPRESSION: Cardiomegaly and moderate interstitial pulmonary edema. Electronically Signed   By: Ulyses Jarred M.D.   On: 03/23/2021 22:15   DG Chest Port 1 View  Result Date: 03/21/2021 CLINICAL DATA:  Cough and altered mental status. EXAM: PORTABLE CHEST 1 VIEW COMPARISON:  March 19, 2021 FINDINGS: There is a dual lead AICD. Diffuse, chronic appearing increased interstitial lung markings are noted. This is stable in appearance when compared to the prior exam. Mild, stable bibasilar atelectasis is noted. There is no evidence of  a pleural effusion or pneumothorax. The heart size and mediastinal contours are within normal limits. The visualized skeletal structures are unremarkable. IMPRESSION: 1. Diffuse, chronic appearing increased interstitial lung markings, stable in appearance when compared to the prior exam. 2. Mild, stable bibasilar atelectasis. Electronically Signed   By: Virgina Norfolk M.D.   On:  03/21/2021 19:27   ECHOCARDIOGRAM COMPLETE  Result Date: 03/22/2021    ECHOCARDIOGRAM REPORT   Patient Name:   LINDI ABRAM Date of Exam: 03/22/2021 Medical Rec #:  [id number]    Height:       62.0 in Accession #:    [id number]   Weight:       134.9 lb Date of Birth:  1950/08/28    BSA:          1.617 m Patient Age:    70 years     BP:           117/60 mmHg Patient Gender: F            HR:           73 bpm. Exam Location:  Inpatient Procedure: 2D Echo, 3D Echo, Cardiac Doppler and Color Doppler Indications:    I50.40* Unspecified combined systolic (congestive) and diastolic                 (congestive) heart failure  History:        Patient has prior history of Echocardiogram examinations, most                 recent 03/09/2020. CHF, Abnormal ECG and Pacemaker, COPD,                 Arrythmias:Bradycardia; Risk Factors:Current Smoker and                 Hypertension.  Sonographer:    Roseanna Rainbow RDCS Referring Phys: [id number] ASIA B [place]  Sonographer Comments: Technically difficult study due to poor echo windows. IMPRESSIONS  1. Left ventricular ejection fraction, by estimation, is 70 to 75%. The left ventricle has hyperdynamic function. The left ventricle has no regional wall motion abnormalities. There is mild left ventricular hypertrophy. Left ventricular diastolic parameters are consistent with Grade II diastolic dysfunction (pseudonormalization). Elevated left atrial pressure.  2. Right ventricular systolic function is normal. The right ventricular size is normal. There is mildly elevated pulmonary artery systolic pressure.  3.  Left atrial size was mildly dilated.  4. The mitral valve is normal in structure. Mild mitral valve regurgitation. No evidence of mitral stenosis.  5. The aortic valve is tricuspid. Aortic valve regurgitation is not visualized. Aortic valve sclerosis/calcification is present, without any evidence of aortic stenosis.  6. The inferior vena cava is normal in size with greater than 50% respiratory variability, suggesting right atrial pressure of 3 mmHg. Comparison(s): No significant change from prior study. FINDINGS  Left Ventricle: Left ventricular ejection fraction, by estimation, is 70 to 75%. The left ventricle has hyperdynamic function. The left ventricle has no regional wall motion abnormalities. The left ventricular internal cavity size was normal in size. There is mild left ventricular hypertrophy. Left ventricular diastolic parameters are consistent with Grade II diastolic dysfunction (pseudonormalization). Elevated left atrial pressure. Right Ventricle: The right ventricular size is normal. Right ventricular systolic function is normal. There is mildly elevated pulmonary artery systolic pressure. The tricuspid regurgitant velocity is 3.18 m/s, and with an assumed right atrial pressure of 3 mmHg, the estimated right ventricular systolic pressure is 30.8 mmHg. Left Atrium: Left atrial size was mildly dilated. Right Atrium: Right atrial size was normal in size. Pericardium: There is no evidence of pericardial effusion. Mitral Valve: The mitral valve is normal in structure. Mild mitral annular calcification. Mild mitral valve regurgitation. No evidence of mitral valve stenosis. MV peak gradient, 12.0 mmHg. The mean mitral valve  gradient is 3.0 mmHg. Tricuspid Valve: The tricuspid valve is normal in structure. Tricuspid valve regurgitation is mild . No evidence of tricuspid stenosis. Aortic Valve: The aortic valve is tricuspid. Aortic valve regurgitation is not visualized. Aortic valve sclerosis/calcification is  present, without any evidence of aortic stenosis. Pulmonic Valve: The pulmonic valve was normal in structure. Pulmonic valve regurgitation is trivial. No evidence of pulmonic stenosis. Aorta: The aortic root is normal in size and structure. Venous: The inferior vena cava is normal in size with greater than 50% respiratory variability, suggesting right atrial pressure of 3 mmHg. IAS/Shunts: No atrial level shunt detected by color flow Doppler. Additional Comments: A device lead is visualized.  LEFT VENTRICLE PLAX 2D LVIDd:         3.70 cm     Diastology LVIDs:         2.50 cm     LV e' medial:    4.95 cm/s LV PW:         1.70 cm     LV E/e' medial:  29.7 LV IVS:        1.20 cm     LV e' lateral:   7.95 cm/s LVOT diam:     2.00 cm     LV E/e' lateral: 18.5 LV SV:         83 LV SV Index:   51 LVOT Area:     3.14 cm                             3D Volume EF: LV Volumes (MOD)           3D EF:        79 % LV vol d, MOD A2C: 59.3 ml LV EDV:       97 ml LV vol d, MOD A4C: 30.4 ml LV ESV:       20 ml LV vol s, MOD A2C: 22.4 ml LV SV:        77 ml LV vol s, MOD A4C: 9.2 ml LV SV MOD A2C:     36.9 ml LV SV MOD A4C:     30.4 ml LV SV MOD BP:      30.5 ml RIGHT VENTRICLE             IVC RV S prime:     14.80 cm/s  IVC diam: 1.80 cm TAPSE (M-mode): 1.8 cm LEFT ATRIUM             Index        RIGHT ATRIUM           Index LA diam:        4.60 cm 2.84 cm/m   RA Area:     12.20 cm LA Vol (A2C):   60.9 ml 37.66 ml/m  RA Volume:   27.30 ml  16.88 ml/m LA Vol (A4C):   41.0 ml 25.35 ml/m LA Biplane Vol: 52.9 ml 32.71 ml/m  AORTIC VALVE             PULMONIC VALVE LVOT Vmax:   137.00 cm/s PR End Diast Vel: 1.81 msec LVOT Vmean:  86.200 cm/s LVOT VTI:    0.263 m  AORTA Ao Root diam: 3.30 cm Ao Asc diam:  3.10 cm MITRAL VALVE                TRICUSPID VALVE MV Area (PHT): 3.27 cm     TR Peak  grad:   40.4 mmHg MV Area VTI:   1.79 cm     TR Vmax:        318.00 cm/s MV Peak grad:  12.0 mmHg MV Mean grad:  3.0 mmHg     SHUNTS MV Vmax:        1.73 m/s     Systemic VTI:  0.26 m MV Vmean:      78.4 cm/s    Systemic Diam: 2.00 cm MV Decel Time: 232 msec MV E velocity: 147.00 cm/s MV A velocity: 76.80 cm/s MV E/A ratio:  1.91 Kirk Ruths MD Electronically signed by Kirk Ruths MD Signature Date/Time: 03/22/2021/12:34:46 PM    Final    US PELVIC COMPLETE WITH TRANSVAGINAL  Result Date: 03/29/2021 CLINICAL DATA:  Pelvic mass EXAM: TRANSABDOMINAL AND TRANSVAGINAL ULTRASOUND OF PELVIS TECHNIQUE: Both transabdominal and transvaginal ultrasound examinations of the pelvis were performed. Transabdominal technique was performed for global imaging of the pelvis including uterus, ovaries, adnexal regions, and pelvic cul-de-sac. It was necessary to proceed with endovaginal exam following the transabdominal exam to visualize the uterus endometrium ovaries. COMPARISON:  CT 03/26/2021 FINDINGS: Uterus Measurements: 6.6 x 3.5 x 3.7 cm = volume: 44.9 mL. No fibroids or other mass visualized. Endometrium Thickness: 3.5 mm. Small amount of fluid in the endometrial canal. Hypoechoic nodule measuring 7 by 3 x 4 mm. Right ovary Not visualized.  Dilated pelvic vessels. Left ovary Meaurements: 2.9 x 1.4 x 1.1 cm Volume:2.4 mL. No adnexal mass is seen. There is no cystic structure visible in the left adnexa. There are dilated pelvic vessels. Other findings None IMPRESSION: 1. The described cystic lesion at the left adnexa is not seen by sonography. 2. Small amount of fluid in the endometrial canal with suspected 7 mm endometrial nodule. Gynecology consultation with possible tissue sampling is recommended. 3. Dilated left greater than right pelvic vessels. Electronically Signed   By: Donavan Foil M.D.   On: 03/29/2021 16:09   CT CHEST ABDOMEN PELVIS WO CONTRAST  Result Date: 03/26/2021 CLINICAL DATA:  Bacteremia, leukocytosis EXAM: CT CHEST, ABDOMEN AND PELVIS WITHOUT CONTRAST TECHNIQUE: Multidetector CT imaging of the chest, abdomen and pelvis was performed following  the standard protocol without IV contrast. COMPARISON:  CT chest 03/21/2021, CT abdomen pelvis 11/23/2019 FINDINGS: CT CHEST FINDINGS Cardiovascular: Moderate multi-vessel coronary artery calcification. Global cardiac size is mildly enlarged, unchanged from prior examination. Left subclavian pacemaker leads are seen within the right atrium and right ventricle. No pericardial effusion. Central pulmonary arteries are enlarged in keeping with changes of pulmonary arterial hypertension. Mild atherosclerotic calcification within the thoracic aorta. No aortic aneurysm. Mediastinum/Nodes: No enlarged mediastinal, hilar, or axillary lymph nodes. Thyroid gland, trachea, and esophagus demonstrate no significant findings. Lungs/Pleura: Since the prior examination, extensive asymmetric ground-glass pulmonary infiltrate has developed within the upper lobes bilaterally most in keeping with atypical infection in the acute setting. Small bilateral pleural effusions have developed, new since prior examination. No pneumothorax. No central obstructing lesion. Musculoskeletal: No acute bone abnormality. No lytic or blastic bone lesion. CT ABDOMEN PELVIS FINDINGS Hepatobiliary: No focal liver abnormality is seen. Status post cholecystectomy. No biliary dilatation. Pancreas: Unremarkable Spleen: Unremarkable Adrenals/Urinary Tract: The adrenal glands are unremarkable. The kidneys are normal in size and position. Moderate right asymmetric perinephric stranding has developed, of unclear significance. This may relate to an underlying infectious, inflammatory, or traumatic process involving the right kidney. No intrarenal or ureteral calcifications are identified. There is no hydronephrosis. The bladder is unremarkable. Stomach/Bowel: Moderate sigmoid diverticulosis.  The stomach, small bowel, and large bowel are otherwise unremarkable. No free intraperitoneal gas. Vascular/Lymphatic: Extensive aortoiliac atherosclerotic calcification. No  aortic aneurysm. No pathologic adenopathy within the abdomen and pelvis. Reproductive: There is interval development of a 5.1 x 2.2 cm indeterminate cystic lesion within the left adnexa. This is not well assessed on this examination. No adnexal masses on the right. The uterus is unremarkable. Other: No abdominal wall hernia.  The rectum is unremarkable. Musculoskeletal: No acute bone abnormality. No lytic or blastic bone lesion. IMPRESSION: Extensive bilateral asymmetric upper lobe ground-glass pulmonary infiltrate, likely infectious in the acute setting. No central obstructing lesion. Moderate multi-vessel coronary artery calcification. Interval development of small bilateral pleural effusions. Interval development of moderate asymmetric right perinephric stranding. In absence of a history of trauma, this is most commonly seen in the setting of pyelonephritis. Correlation with urinalysis and urine culture may be helpful for further management. Moderate sigmoid diverticulosis. Interval development of a 5.1 cm indeterminate cystic lesion within the left adnexa. This is not well characterized on this noncontrast examination. This would be better assessed with dedicated pelvic sonography. Aortic Atherosclerosis (ICD10-I70.0). Electronically Signed   By: Fidela [place] M.D.   On: 03/26/2021 19:41     Discharge Exam: Vitals:   04/01/21 0401 04/01/21 0715  BP:  130/78  Pulse: 100 99  Resp: 17 19  Temp:  97.9 F (36.6 C)  SpO2: 91% 94%   Vitals:   04/01/21 0359 04/01/21 0400 04/01/21 0401 04/01/21 0715  BP:  132/86  130/78  Pulse: 98 97 100 99  Resp: 15 17 17 19   Temp:  97.8 F (36.6 C)  97.9 F (36.6 C)  TempSrc:  Oral  Oral  SpO2: 91% 92% 91% 94%  Weight:  62.1 kg    Height:        General: Pt is alert, awake, not in acute distress Cardiovascular: Irregularly irregular rate and rhythm, S1/S2 +, no rubs, no gallops Respiratory: CTA bilaterally, no wheezing, no rhonchi Abdominal: Soft, NT, ND,  bowel sounds + Extremities: no edema, no cyanosis    The results of significant diagnostics from this hospitalization (including imaging, microbiology, ancillary and laboratory) are listed below for reference.     Microbiology: Recent Results (from the past 240 hour(s))  Urine Culture     Status: None   Collection Time: 03/22/21  2:07 PM   Specimen: In/Out Cath Urine  Result Value Ref Range Status   Specimen Description IN/OUT CATH URINE  Final   Special Requests NONE  Final   Culture   Final    NO GROWTH Performed at [facility], [address], [place], [place] [address]    Report Status 03/23/2021 FINAL  Final  MRSA Next Gen by PCR, Nasal     Status: None   Collection Time: 03/22/21  3:00 PM  Result Value Ref Range Status   MRSA by PCR Next Gen NOT DETECTED NOT DETECTED Final    Comment: (NOTE) The GeneXpert MRSA Assay (FDA approved for NASAL specimens only), is one component of a comprehensive MRSA colonization surveillance program. It is not intended to diagnose MRSA infection nor to guide or monitor treatment for MRSA infections. Test performance is not FDA approved in patients less than 46 years old. Performed at [facility], [place] [address], [place], [place] [address]   Culture, blood (routine x 2)     Status: None   Collection Time: 03/23/21  5:44 PM   Specimen: BLOOD  Result Value Ref Range Status   Specimen Description BLOOD LEFT ANTECUBITAL  Final   Special Requests   Final    BOTTLES DRAWN AEROBIC AND ANAEROBIC Blood Culture results may not be optimal due to an inadequate volume of blood received in culture bottles   Culture   Final    NO GROWTH 5 DAYS Performed at [place] [address] [place], [place] [address]    Report Status 03/28/2021 FINAL  Final  Culture, blood (routine x 2)     Status: None   Collection Time: 03/23/21  5:44 PM   Specimen: BLOOD LEFT HAND  Result Value Ref Range Status   Specimen Description BLOOD LEFT  HAND  Final   Special Requests   Final    BOTTLES DRAWN AEROBIC ONLY Blood Culture adequate volume   Culture   Final    NO GROWTH 5 DAYS Performed at [facility], [place] [address], [place], [place] [address]    Report Status 03/28/2021 FINAL  Final  Culture, blood (routine x 2)     Status: None   Collection Time: 03/24/21  9:07 AM   Specimen: BLOOD RIGHT HAND  Result Value Ref Range Status   Specimen Description BLOOD RIGHT HAND  Final   Special Requests   Final    AEROBIC BOTTLE ONLY Blood Culture results may not be optimal due to an inadequate volume of blood received in culture bottles   Culture   Final    NO GROWTH 5 DAYS Performed at [facility], [place] [address], [place], [place] [address]    Report Status 03/29/2021 FINAL  Final  Culture, blood (routine x 2)     Status: None   Collection Time: 03/24/21  9:17 AM   Specimen: BLOOD LEFT HAND  Result Value Ref Range Status   Specimen Description BLOOD LEFT HAND  Final   Special Requests   Final    BOTTLES DRAWN AEROBIC AND ANAEROBIC Blood Culture adequate volume   Culture   Final    NO GROWTH 5 DAYS Performed at [facility], [place] [address] [place], [place] [address]    Report Status 03/29/2021 FINAL  Final  Respiratory (~20 pathogens) panel by PCR     Status: None   Collection Time: 03/27/21 11:35 AM   Specimen: Nasopharyngeal Swab; Respiratory  Result Value Ref Range Status   Adenovirus NOT DETECTED NOT DETECTED Final   Coronavirus 229E NOT DETECTED NOT DETECTED Final    Comment: (NOTE) The Coronavirus on the Respiratory Panel, DOES NOT test for the novel  Coronavirus (2019 nCoV)    Coronavirus HKU1 NOT DETECTED NOT DETECTED Final   Coronavirus NL63 NOT DETECTED NOT DETECTED Final   Coronavirus OC43 NOT DETECTED NOT DETECTED Final   Metapneumovirus NOT DETECTED NOT DETECTED Final   Rhinovirus / Enterovirus NOT DETECTED NOT DETECTED Final   Influenza A NOT DETECTED NOT DETECTED Final   Influenza B  NOT DETECTED NOT DETECTED Final   Parainfluenza Virus 1 NOT DETECTED NOT DETECTED Final   Parainfluenza Virus 2 NOT DETECTED NOT DETECTED Final   Parainfluenza Virus 3 NOT DETECTED NOT DETECTED Final   Parainfluenza Virus 4 NOT DETECTED NOT DETECTED Final   Respiratory Syncytial Virus NOT DETECTED NOT DETECTED Final   Bordetella pertussis NOT DETECTED NOT DETECTED Final   Bordetella Parapertussis NOT DETECTED NOT DETECTED Final   Chlamydophila pneumoniae NOT DETECTED NOT DETECTED Final   Mycoplasma pneumoniae NOT DETECTED NOT DETECTED Final  Comment: Performed at [facility], [place] [address], [place], [place] [address]     Labs: BNP (last 3 results) Recent Labs    03/22/21 0648 03/24/21 0011 03/28/21 0226  BNP 1,498.1* 2,707.6* 053.9*   Basic Metabolic Panel: Recent Labs  Lab 03/28/21 0226 03/29/21 0328 03/30/21 0217 03/31/21 0249 04/01/21 0210  NA 127* 128* 128* 128* 129*  K 4.3 5.0 4.7 4.5 4.2  CL 94* 96* 96* 97* 96*  CO2 23 24 24 24 25   GLUCOSE 154* 147* 115* 109* 109*  BUN 30* 28* 25* 22 23  CREATININE 1.12* 1.12* 0.96 0.93 0.92  CALCIUM 8.8* 9.1 9.0 8.8* 8.9  MG 1.8  --   --   --   --    Liver Function Tests: Recent Labs  Lab 03/27/21 1245  AST 48*  ALT 34  ALKPHOS 74  BILITOT 0.6  PROT 5.8*  ALBUMIN 2.5*   No results for input(s): LIPASE, AMYLASE in the last 168 hours. No results for input(s): AMMONIA in the last 168 hours. CBC: Recent Labs  Lab 03/26/21 0522 03/27/21 0213 03/28/21 0226 03/29/21 0328  WBC 19.7* 22.2* 21.2* 21.6*  NEUTROABS  --   --  18.8*  --   HGB 10.8* 11.6* 10.6* 10.4*  HCT 29.7* 32.9* 29.7* 29.4*  MCV 88.1 88.0 87.9 87.5  PLT 221 271 281 343   Cardiac Enzymes: No results for input(s): CKTOTAL, CKMB, CKMBINDEX, TROPONINI in the last 168 hours. BNP: Invalid input(s): POCBNP CBG: Recent Labs  Lab 03/25/21 1946 03/25/21 2311 03/26/21 0416 03/27/21 1149 03/28/21 0735  GLUCAP 145* 147* 123* 106* 146*    D-Dimer No results for input(s): DDIMER in the last 72 hours. Hgb A1c No results for input(s): HGBA1C in the last 72 hours. Lipid Profile No results for input(s): CHOL, HDL, LDLCALC, TRIG, CHOLHDL, LDLDIRECT in the last 72 hours. Thyroid function studies No results for input(s): TSH, T4TOTAL, T3FREE, THYROIDAB in the last 72 hours.  Invalid input(s): FREET3 Anemia work up No results for input(s): VITAMINB12, FOLATE, FERRITIN, TIBC, IRON, RETICCTPCT in the last 72 hours. Urinalysis    Component Value Date/Time   COLORURINE YELLOW 03/22/2021 [place] 03/22/2021 1218   LABSPEC 1.010 03/22/2021 1218   PHURINE 6.5 03/22/2021 [place] 03/22/2021 1218   HGBUR TRACE (A) 03/22/2021 1218   [place] 03/22/2021 [place] 03/22/2021 1218   PROTEINUR NEGATIVE 03/22/2021 1218   NITRITE NEGATIVE 03/22/2021 1218   LEUKOCYTESUR SMALL (A) 03/22/2021 1218   Sepsis Labs Invalid input(s): PROCALCITONIN,  WBC,  LACTICIDVEN Microbiology Recent Results (from the past 240 hour(s))  Urine Culture     Status: None   Collection Time: 03/22/21  2:07 PM   Specimen: In/Out Cath Urine  Result Value Ref Range Status   Specimen Description IN/OUT CATH URINE  Final   Special Requests NONE  Final   Culture   Final    NO GROWTH Performed at [facility], [place] [address] [place],  [address]    Report Status 03/23/2021 FINAL  Final  MRSA Next Gen by PCR, Nasal     Status: None   Collection Time: 03/22/21  3:00 PM  Result Value Ref Range Status   MRSA by PCR Next Gen NOT DETECTED NOT DETECTED Final    Comment: (NOTE) The GeneXpert MRSA Assay (FDA approved for NASAL specimens only), is one component of a comprehensive MRSA colonization surveillance program. It is not intended  to diagnose MRSA infection nor to guide or monitor treatment for MRSA infections. Test performance is not FDA approved in patients less than 46  years old. Performed at [facility], [place] [address], [place], [place] [address]   Culture, blood (routine x 2)     Status: None   Collection Time: 03/23/21  5:44 PM   Specimen: BLOOD  Result Value Ref Range Status   Specimen Description BLOOD LEFT ANTECUBITAL  Final   Special Requests   Final    BOTTLES DRAWN AEROBIC AND ANAEROBIC Blood Culture results may not be optimal due to an inadequate volume of blood received in culture bottles   Culture   Final    NO GROWTH 5 DAYS Performed at [facility], [place] [address] [place], [place] [address]    Report Status 03/28/2021 FINAL  Final  Culture, blood (routine x 2)     Status: None   Collection Time: 03/23/21  5:44 PM   Specimen: BLOOD LEFT HAND  Result Value Ref Range Status   Specimen Description BLOOD LEFT HAND  Final   Special Requests   Final    BOTTLES DRAWN AEROBIC ONLY Blood Culture adequate volume   Culture   Final    NO GROWTH 5 DAYS Performed at [facility], [place] [address], [place], [place] [address]    Report Status 03/28/2021 FINAL  Final  Culture, blood (routine x 2)     Status: None   Collection Time: 03/24/21  9:07 AM   Specimen: BLOOD RIGHT HAND  Result Value Ref Range Status   Specimen Description BLOOD RIGHT HAND  Final   Special Requests   Final    AEROBIC BOTTLE ONLY Blood Culture results may not be optimal due to an inadequate volume of blood received in culture bottles   Culture   Final    NO GROWTH 5 DAYS Performed at [facility], [place] [address] [place], [place] [address]    Report Status 03/29/2021 FINAL  Final  Culture, blood (routine x 2)     Status: None   Collection Time: 03/24/21  9:17 AM   Specimen: BLOOD LEFT HAND  Result Value Ref Range Status   Specimen Description BLOOD LEFT HAND  Final   Special Requests   Final    BOTTLES DRAWN AEROBIC AND ANAEROBIC Blood Culture adequate volume   Culture   Final    NO GROWTH 5 DAYS Performed at [facility], [place] [address], [place], [place] [address]    Report Status 03/29/2021 FINAL  Final  Respiratory (~20 pathogens) panel by PCR     Status: None   Collection Time: 03/27/21 11:35 AM   Specimen: Nasopharyngeal Swab; Respiratory  Result Value Ref Range Status   Adenovirus NOT DETECTED NOT DETECTED Final   Coronavirus 229E NOT DETECTED NOT DETECTED Final    Comment: (NOTE) The Coronavirus on the Respiratory Panel, DOES NOT test for the novel  Coronavirus (2019 nCoV)    Coronavirus HKU1 NOT DETECTED NOT DETECTED Final   Coronavirus NL63 NOT DETECTED NOT DETECTED Final   Coronavirus OC43 NOT DETECTED NOT DETECTED Final   Metapneumovirus NOT DETECTED NOT DETECTED Final   Rhinovirus / Enterovirus NOT DETECTED NOT DETECTED Final   Influenza A NOT DETECTED NOT DETECTED Final   Influenza B NOT DETECTED NOT DETECTED Final   Parainfluenza Virus 1 NOT DETECTED NOT DETECTED Final   Parainfluenza Virus 2 NOT DETECTED NOT DETECTED Final  Parainfluenza Virus 3 NOT DETECTED NOT DETECTED Final   Parainfluenza Virus 4 NOT DETECTED NOT DETECTED Final   Respiratory Syncytial Virus NOT DETECTED NOT DETECTED Final   Bordetella pertussis NOT DETECTED NOT DETECTED Final   Bordetella Parapertussis NOT DETECTED NOT DETECTED Final   Chlamydophila pneumoniae NOT DETECTED NOT DETECTED Final   Mycoplasma pneumoniae NOT DETECTED NOT DETECTED Final    Comment: Performed at [facility], [place] [address], [place], [place] [address]     Time coordinating discharge: Over 30 minutes  SIGNED:   Darliss Cheney, MD  [facility] 04/01/2021, 9:56 AM  If 7PM-7AM, please contact night-coverage [email or web address]

## 2021-04-02 ENCOUNTER — Telehealth: Payer: Self-pay | Admitting: Cardiology

## 2021-04-02 DIAGNOSIS — Z79899 Other long term (current) drug therapy: Secondary | ICD-10-CM

## 2021-04-02 NOTE — Telephone Encounter (Signed)
Pt's husband called and pt now with edema to knees, she did sit up in chair most of day with feet down. And he has not let her eat much salt.  She will take an extra 20 lasix now then she will go to 40 mg daily for Tuesday and  Wed and call our triage to give update.   Triage  If still with swelling then stay on 40 mg daily if edema resolved go back to 20 mg.  If still with edema stay on 40 mg - she may need script.  But she will need BMP Thursday please either way.  Thanks

## 2021-04-03 ENCOUNTER — Telehealth: Payer: Self-pay

## 2021-04-03 DIAGNOSIS — Z950 Presence of cardiac pacemaker: Secondary | ICD-10-CM | POA: Diagnosis not present

## 2021-04-03 DIAGNOSIS — G9341 Metabolic encephalopathy: Secondary | ICD-10-CM | POA: Diagnosis not present

## 2021-04-03 DIAGNOSIS — J449 Chronic obstructive pulmonary disease, unspecified: Secondary | ICD-10-CM | POA: Diagnosis not present

## 2021-04-03 DIAGNOSIS — I10 Essential (primary) hypertension: Secondary | ICD-10-CM | POA: Diagnosis not present

## 2021-04-03 DIAGNOSIS — Z7982 Long term (current) use of aspirin: Secondary | ICD-10-CM | POA: Diagnosis not present

## 2021-04-03 DIAGNOSIS — Z7901 Long term (current) use of anticoagulants: Secondary | ICD-10-CM | POA: Diagnosis not present

## 2021-04-03 DIAGNOSIS — M329 Systemic lupus erythematosus, unspecified: Secondary | ICD-10-CM | POA: Diagnosis not present

## 2021-04-03 DIAGNOSIS — I4891 Unspecified atrial fibrillation: Secondary | ICD-10-CM | POA: Diagnosis not present

## 2021-04-03 DIAGNOSIS — R7881 Bacteremia: Secondary | ICD-10-CM | POA: Diagnosis not present

## 2021-04-03 DIAGNOSIS — I5033 Acute on chronic diastolic (congestive) heart failure: Secondary | ICD-10-CM | POA: Diagnosis not present

## 2021-04-03 NOTE — Telephone Encounter (Signed)
Almira calling from [facility] for HH-PT 2w 2 1w 2 2w 1  Also needing verbals for Nursing evaluation for HF management  Please contact Almira at [phone]

## 2021-04-04 NOTE — Telephone Encounter (Signed)
Called patient, she was originally scheduled for 01/20 follow up, moved her up to 01/06 follow up that was the next soonest.  Patient made aware to continue dose per MD.   Patient and husband thankful for call back.

## 2021-04-04 NOTE — Telephone Encounter (Signed)
Husband call back to give update on the increase in medication. He states its not really working because she still have fluid and swelling going on. Please advise

## 2021-04-04 NOTE — Telephone Encounter (Signed)
Spoke to pt's son. He report yesterday they saw slight improvement in swelling but today it increased again. Husband informed of Cecilie Kicks, NP recommendations to continue 40 mg daily until edema resolve, then go back to 20 mg. Husband also made aware pt will need lab work Thursday. Husband verbalized understanding.  Husband also wanted to make MD aware that pt was recently prescribed prednisone for 15 days and wondering if that could be contributing to swelling. Will forward for recommendations.

## 2021-04-04 NOTE — Addendum Note (Signed)
Addended by: Meryl Crutch on: 04/04/2021 03:27 PM   Modules accepted: Orders

## 2021-04-05 ENCOUNTER — Other Ambulatory Visit: Payer: PPO

## 2021-04-05 ENCOUNTER — Other Ambulatory Visit: Payer: Self-pay

## 2021-04-05 ENCOUNTER — Telehealth: Payer: Self-pay

## 2021-04-05 DIAGNOSIS — Z79899 Other long term (current) drug therapy: Secondary | ICD-10-CM | POA: Diagnosis not present

## 2021-04-05 LAB — BASIC METABOLIC PANEL
BUN/Creatinine Ratio: 22 (ref 12–28)
BUN: 27 mg/dL (ref 8–27)
CO2: 23 mmol/L (ref 20–29)
Calcium: 9.5 mg/dL (ref 8.7–10.3)
Chloride: 91 mmol/L — ABNORMAL LOW (ref 96–106)
Creatinine, Ser: 1.22 mg/dL — ABNORMAL HIGH (ref 0.57–1.00)
Glucose: 95 mg/dL (ref 70–99)
Potassium: 5.4 mmol/L — ABNORMAL HIGH (ref 3.5–5.2)
Sodium: 130 mmol/L — ABNORMAL LOW (ref 134–144)
eGFR: 47 mL/min/{1.73_m2} — ABNORMAL LOW (ref >59)

## 2021-04-05 NOTE — Telephone Encounter (Signed)
[employer] alert received for increased AF burden. Reports she was admitted for 9 days at [facility], AF was noted. She has several f/u apts and lab work today. Patient is on [place]. Reports she is doing better each today (including today). Advised we will continue to review alerts if they come across and will call if warranted. Advised to call if further questions or concerns arise. Verbalized understanding.

## 2021-04-06 ENCOUNTER — Telehealth: Payer: Self-pay | Admitting: *Deleted

## 2021-04-06 DIAGNOSIS — Z79899 Other long term (current) drug therapy: Secondary | ICD-10-CM

## 2021-04-06 NOTE — Telephone Encounter (Signed)
Pt notified and order placed 

## 2021-04-06 NOTE — Telephone Encounter (Signed)
-----   Message from Isaiah Serge, NP sent at 04/06/2021  6:05 AM EST ----- Have pt hold her lasix today then resume 20 mg daily tomorrow, decrease salt and potassium rich foods- bananas oranges etc and recheck BMP on Monday,  her kidneys show stress from diuretic and her potassium is too high.  thanks  Cecilie Kicks, FNP-C

## 2021-04-09 ENCOUNTER — Other Ambulatory Visit: Payer: PPO

## 2021-04-09 DIAGNOSIS — Z79899 Other long term (current) drug therapy: Secondary | ICD-10-CM | POA: Diagnosis not present

## 2021-04-09 LAB — BASIC METABOLIC PANEL
BUN/Creatinine Ratio: 23 (ref 12–28)
BUN: 25 mg/dL (ref 8–27)
CO2: 25 mmol/L (ref 20–29)
Calcium: 8.7 mg/dL (ref 8.7–10.3)
Chloride: 89 mmol/L — ABNORMAL LOW (ref 96–106)
Creatinine, Ser: 1.09 mg/dL — ABNORMAL HIGH (ref 0.57–1.00)
Glucose: 125 mg/dL — ABNORMAL HIGH (ref 70–99)
Potassium: 4.6 mmol/L (ref 3.5–5.2)
Sodium: 126 mmol/L — ABNORMAL LOW (ref 134–144)
eGFR: 54 mL/min/{1.73_m2} — ABNORMAL LOW (ref >59)

## 2021-04-10 ENCOUNTER — Encounter: Payer: Self-pay | Admitting: Internal Medicine

## 2021-04-10 ENCOUNTER — Other Ambulatory Visit: Payer: Self-pay

## 2021-04-10 ENCOUNTER — Ambulatory Visit (INDEPENDENT_AMBULATORY_CARE_PROVIDER_SITE_OTHER): Payer: PPO | Admitting: Internal Medicine

## 2021-04-10 VITALS — BP 118/76 | HR 78 | Resp 18 | Ht 62.0 in | Wt 132.0 lb

## 2021-04-10 DIAGNOSIS — E871 Hypo-osmolality and hyponatremia: Secondary | ICD-10-CM | POA: Diagnosis not present

## 2021-04-10 DIAGNOSIS — J431 Panlobular emphysema: Secondary | ICD-10-CM

## 2021-04-10 DIAGNOSIS — I4891 Unspecified atrial fibrillation: Secondary | ICD-10-CM

## 2021-04-10 DIAGNOSIS — J189 Pneumonia, unspecified organism: Secondary | ICD-10-CM

## 2021-04-10 DIAGNOSIS — J410 Simple chronic bronchitis: Secondary | ICD-10-CM

## 2021-04-10 MED ORDER — NYSTATIN 100000 UNIT/ML MT SUSP: 5.0000 mL | Freq: Three times a day (TID) | OROMUCOSAL | 0 refills | Status: DC

## 2021-04-10 NOTE — Assessment & Plan Note (Signed)
Advised to quit. She will think about this.

## 2021-04-10 NOTE — Progress Notes (Signed)
° °  Subjective:   Patient ID: Sheila Morales, female    DOB: 12/23/1950, 70 y.o.   MRN: [id number]  HPI The patient is a 70 YO female coming in for hospital follow up (in for UTI caused bacteremia, hyponatremia, acute CHF) with extended stay. She is feeling okay since being home. Still struggling with swelling to legs. Currently working with cardiology on lasix dosing. Taking 20 mg daily currently. Labs yesterday reviewed with them. They are following low sodium diet and fluid restriction.   PMH, [facility], social history reviewed and updated.   Review of Systems  Constitutional:  Positive for fatigue.  HENT: Negative.    Eyes: Negative.   Respiratory:  Negative for cough, chest tightness and shortness of breath.   Cardiovascular:  Negative for chest pain, palpitations and leg swelling.  Gastrointestinal:  Positive for constipation. Negative for abdominal distention, abdominal pain, diarrhea, nausea and vomiting.  Musculoskeletal: Negative.   Skin: Negative.   Neurological: Negative.   Psychiatric/Behavioral: Negative.     Objective:  Physical Exam Constitutional:      Appearance: She is well-developed.  HENT:     Head: Normocephalic and atraumatic.  Cardiovascular:     Rate and Rhythm: Normal rate. Rhythm irregular.  Pulmonary:     Effort: Pulmonary effort is normal. No respiratory distress.     Breath sounds: Rhonchi present. No wheezing or rales.     Comments: Stable lung exam Abdominal:     General: Bowel sounds are normal. There is no distension.     Palpations: Abdomen is soft.     Tenderness: There is no abdominal tenderness. There is no rebound.  Musculoskeletal:     Cervical back: Normal range of motion.  Skin:    General: Skin is warm and dry.  Neurological:     Mental Status: She is alert and oriented to person, place, and time.     Coordination: Coordination abnormal.     Comments: Walker for ambulation but steady gait    Vitals:   04/10/21 0914  BP: 118/76  Pulse:  78  Resp: 18  SpO2: 97%  Weight: 132 lb (59.9 kg)  Height: 5\' 2"  (1.575 m)    This visit occurred during the SARS-CoV-2 public health emergency.  Safety protocols were in place, including screening questions prior to the visit, additional usage of staff PPE, and extensive cleaning of exam room while observing appropriate contact time as indicated for disinfecting solutions.   Assessment & Plan:

## 2021-04-10 NOTE — Assessment & Plan Note (Signed)
Following with cardiology. Sounds irregular on exam today.

## 2021-04-10 NOTE — Assessment & Plan Note (Signed)
Reviewed labs from yesterday and overall stable. No symptoms from mildly low levels. She is mildly volume overloaded currently and resumption of lasix yesterday will likely help. They are adhering to sodium and water restriction.

## 2021-04-10 NOTE — Assessment & Plan Note (Signed)
Needs repeat CXR in about 3-4 weeks so ordered today and given instructions on when to come. Overall breathing is improving.

## 2021-04-10 NOTE — Patient Instructions (Addendum)
It is okay to take acetaminophen (tylenol) for pain.  Come do the x-ray when out on Jan 6th.  We have sent in nystatin to swish in mouth for 30 seconds and then swallow 3 times a day for 3-5 days.

## 2021-04-10 NOTE — Assessment & Plan Note (Signed)
Concern for thrush on exam with some lesion in the mouth and trouble swallowing. Rx nystatin swish and swallow for 5 days. Continue anoro and counseled about smoking cessation.

## 2021-04-12 ENCOUNTER — Telehealth: Payer: Self-pay | Admitting: Internal Medicine

## 2021-04-12 NOTE — Telephone Encounter (Signed)
Brad w/ [phone] informing provider patient is loosing an excessive amount of weight  Caller stated patient was last weighed 04-12-2022 w/ a weigh in at 123lbs

## 2021-04-12 NOTE — Telephone Encounter (Signed)
Myra calling in from [place] new perimeter for patient weight to monitor her congestive heart failure  Callback # [phone]

## 2021-04-14 ENCOUNTER — Encounter (HOSPITAL_BASED_OUTPATIENT_CLINIC_OR_DEPARTMENT_OTHER): Payer: Self-pay

## 2021-04-14 ENCOUNTER — Emergency Department (HOSPITAL_BASED_OUTPATIENT_CLINIC_OR_DEPARTMENT_OTHER): Payer: PPO

## 2021-04-14 ENCOUNTER — Other Ambulatory Visit: Payer: Self-pay

## 2021-04-14 ENCOUNTER — Emergency Department (HOSPITAL_BASED_OUTPATIENT_CLINIC_OR_DEPARTMENT_OTHER)
Admission: EM | Admit: 2021-04-14 | Discharge: 2021-04-14 | Disposition: A | Discharge status: Home / Self Care | Payer: PPO | Attending: Student | Admitting: Student

## 2021-04-14 DIAGNOSIS — S93502A Unspecified sprain of left great toe, initial encounter: Secondary | ICD-10-CM | POA: Diagnosis not present

## 2021-04-14 DIAGNOSIS — Z7982 Long term (current) use of aspirin: Secondary | ICD-10-CM | POA: Diagnosis not present

## 2021-04-14 DIAGNOSIS — L819 Disorder of pigmentation, unspecified: Secondary | ICD-10-CM | POA: Diagnosis not present

## 2021-04-14 DIAGNOSIS — Z79899 Other long term (current) drug therapy: Secondary | ICD-10-CM | POA: Insufficient documentation

## 2021-04-14 DIAGNOSIS — S93509A Unspecified sprain of unspecified toe(s), initial encounter: Secondary | ICD-10-CM

## 2021-04-14 DIAGNOSIS — Z7952 Long term (current) use of systemic steroids: Secondary | ICD-10-CM | POA: Insufficient documentation

## 2021-04-14 DIAGNOSIS — J449 Chronic obstructive pulmonary disease, unspecified: Secondary | ICD-10-CM | POA: Diagnosis not present

## 2021-04-14 DIAGNOSIS — S93505A Unspecified sprain of left lesser toe(s), initial encounter: Secondary | ICD-10-CM | POA: Diagnosis not present

## 2021-04-14 DIAGNOSIS — I10 Essential (primary) hypertension: Secondary | ICD-10-CM | POA: Diagnosis not present

## 2021-04-14 DIAGNOSIS — F17210 Nicotine dependence, cigarettes, uncomplicated: Secondary | ICD-10-CM | POA: Diagnosis not present

## 2021-04-14 DIAGNOSIS — S99922A Unspecified injury of left foot, initial encounter: Secondary | ICD-10-CM | POA: Diagnosis present

## 2021-04-14 DIAGNOSIS — Z950 Presence of cardiac pacemaker: Secondary | ICD-10-CM | POA: Diagnosis not present

## 2021-04-14 DIAGNOSIS — X58XXXA Exposure to other specified factors, initial encounter: Secondary | ICD-10-CM | POA: Diagnosis not present

## 2021-04-14 NOTE — ED Triage Notes (Signed)
Pt has bruising on left foot 2nd toe that she noticed today. Pt does not remember hitting it on anything or any other injury/trauma.

## 2021-04-14 NOTE — ED Provider Notes (Signed)
[facility] Provider Note   CSN: [id number] Arrival date & time: 04/14/21  1717     History Chief Complaint  Patient presents with   Toe Pain    Sheila Morales is a 70 y.o. female who presents emergency department for evaluation of toe pain.  Patient states that she noticed pain and bruising to the left second toe.  She states that she has a previous injury to the ligaments in that toe and she is unable to curl the toe since then.  She states that she has been on her feet all day and noticed this bruising in the shower.  She arrives with tenderness to the second toe but no other evidence of traumatic injury.  Patient denies any trauma to the foot.   Toe Pain Pertinent negatives include no chest pain, no abdominal pain and no shortness of breath.      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     Patient Active Problem List   Diagnosis Date Noted   E coli bacteremia 04/01/2021   New onset atrial fibrillation ([place]) 03/28/2021   Chronic anticoagulation 26/37/8588   Acute metabolic encephalopathy 50/27/7412   Pulmonary vascular congestion 03/21/2021   Acute on chronic diastolic heart failure ([place]) 03/21/2021   Pacemaker 09/07/2020   Left foot pain 06/30/2020   Sinus node dysfunction ([place]) 05/26/2020   Bradycardia 05/11/2020   Pain in right lower leg 02/18/2020   Episodic lightheadedness 02/02/2020   Hypertension 02/02/2020   LLQ pain 11/23/2019   Cyst of  buttocks 10/05/2019   Methotrexate, long term, current use 07/29/2019   Community acquired pneumonia 02/05/2018   BPPV (benign paroxysmal positional vertigo) 12/16/2017   Acute respiratory failure with hypoxia and hypercapnia ([place]) 11/05/2017   Hyponatremia 11/01/2017   AKI (acute kidney injury) ([place])    Insomnia 02/24/2017   COPD (chronic obstructive pulmonary disease) ([place]) 01/22/2017   Routine general medical examination at a health care facility 02/19/2016   Hereditary and idiopathic peripheral neuropathy 02/17/2015   Smokers' cough ([place]) 12/07/2014   Systemic lupus erythematosus ([place]) 10/13/2013   Cough 01/18/2009   Tobacco use 01/02/2009   Rash 05/31/2008   Irritable bowel syndrome 03/25/2008   History of rheumatic fever as a child 01/17/2007    Past Surgical History:  Procedure Laterality Date   CHOLECYSTECTOMY     CHOLECYSTECTOMY, LAPAROSCOPIC     COLONOSCOPY     ESOPHAGOGASTRODUODENOSCOPY     lesion, vulva excision     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   Procedure: PACEMAKER IMPLANT;  Surgeon: [name], MD;  Location: [facility];  Service: Cardiovascular;  Laterality: N/A;   TONSILLECTOMY     VIDEO BRONCHOSCOPY Bilateral 11/06/2017   Procedure: VIDEO BRONCHOSCOPY WITHOUT FLUORO;  Surgeon: Marshell Garfinkel, MD;  Location: [facility];  Service: Cardiopulmonary;  Laterality: Bilateral;     OB History   No obstetric history on file.  Family History  Problem Relation Age of Onset   Stroke Mother        Deceased, 22s   Atrial fibrillation Father    Colon cancer Father    Other Sister        Pick's disease, deceased   Healthy Daughter     Social History   Tobacco Use   Smoking status: Every Day    Packs/day: 0.25    Years: 30.00    Pack years: 7.50    Types: Cigarettes    Last attempt to quit: 01/21/2020    Years since quitting: 1.2   Smokeless tobacco: Never   Tobacco comments:    smokes "every now and then" 08/24/20 [facility]   Vaping Use    Vaping Use: Never used  Substance Use Topics   Alcohol use: Yes    Alcohol/week: 5.0 standard drinks    Types: 5 Standard drinks or equivalent per week    Comment: 4 out of 7 dyas per husband at bedside   Drug use: No    Home Medications Prior to Admission medications   Medication Sig Start Date End Date Taking? Authorizing Provider  albuterol (VENTOLIN HFA) 108 (90 Base) MCG/ACT inhaler Inhale 1-2 puffs into the lungs every 6 (six) hours as needed for wheezing or shortness of breath. 10/13/20   Byrum, Rose Fillers, MD  amiodarone (PACERONE) 200 MG tablet Take 1 tablet (200 mg total) by mouth 2 (two) times daily. 04/01/21 05/01/21  Darliss Cheney, MD  ANORO ELLIPTA 62.5-25 MCG/ACT AEPB INHALE 1 PUFF INTO THE LUNGS DAILY. Patient taking differently: Inhale 1 puff into the lungs daily. 02/26/21   Collene Gobble, MD  apixaban (ELIQUIS) 5 MG TABS tablet Take 1 tablet (5 mg total) by mouth 2 (two) times daily. 04/01/21 05/01/21  Darliss Cheney, MD  aspirin 81 MG tablet Take 81 mg by mouth at bedtime.    [provider]  benzonatate (TESSALON) 200 MG capsule Take 1 capsule (200 mg total) by mouth every 6 (six) hours as needed for cough. 03/20/21   Collene Gobble, MD  BLACK COHOSH PO Take 1 capsule by mouth daily.    [provider]  calcium carbonate (OS-CAL - DOSED IN MG OF ELEMENTAL CALCIUM) 1250 (500 Ca) MG tablet Take 1 tablet by mouth daily as needed (heartburn).    [provider]  Cholecalciferol (VITAMIN D) 50 MCG (2000 UT) tablet Take 2,000 Units by mouth daily.    [provider]  diltiazem (CARDIZEM CD) 360 MG 24 hr capsule Take 1 capsule (360 mg total) by mouth daily. 04/02/21 05/02/21  Darliss Cheney, MD  folic acid (FOLVITE) 1 MG tablet Take 1 mg by mouth daily.    [provider]  furosemide (LASIX) 20 MG tablet Take 1 tablet (20 mg total) by mouth daily. 04/02/21 05/02/21  Darliss Cheney, MD  gabapentin (NEURONTIN) 300 MG capsule TAKE TWO CAPSULES  BY MOUTH EVERY MORNING, TWO CAPSULES EACH AFTERNOON AND THREE CAPSULES AT BEDTIME Patient taking differently: Take 900-1,200 mg by mouth See admin instructions. 900 mg in the morning, and 1200 mg at bedtime 02/27/21   Hoyt Koch, MD  hydroxychloroquine (PLAQUENIL) 200 MG tablet Take 200 mg by mouth daily.    [provider]  methotrexate 2.5 MG tablet Take 10 mg by mouth every Thursday.    [provider]  metoprolol tartrate (LOPRESSOR) 25 MG tablet Take 1 tablet (25 mg total) by mouth 2 (two) times daily. 04/01/21 05/01/21  Pahwani,  Einar Grad, MD  Misc Natural Products (ADVANCED JOINT RELIEF) CAPS Take 1 capsule by mouth daily. Joint Advantage Gold 5x    [provider]  Multiple Vitamin (MULTIVITAMIN WITH MINERALS) TABS tablet Take 1 tablet by mouth daily.    [provider]  nystatin (MYCOSTATIN) 100000 UNIT/ML suspension Take 5 mLs (500,000 Units total) by mouth in the morning, at noon, and at bedtime. 04/10/21   Hoyt Koch, MD  Omega-3 Fatty Acids (OMEGA 3 PO) Take 1 capsule by mouth daily.    [provider]  OVER THE COUNTER MEDICATION Take 1 capsule by mouth daily. brain essentials otc supplement    [provider]  predniSONE (DELTASONE) 10 MG tablet Take 30 mg (3 tabs) po daily for 5 days, followed by 20 mg (2 tabs) po daily for 5 days, followed by 10 mg po daily for 5 days. 04/01/21   Darliss Cheney, MD  RESTASIS 0.05 % ophthalmic emulsion Place 1 drop into both eyes 2 (two) times daily. 07/30/17   [provider]  vitamin C (ASCORBIC ACID) 500 MG tablet Take 500 mg by mouth daily.    [provider]  vitamin E 180 MG (400 UNITS) capsule Take 400 Units by mouth daily.    [provider]  zolpidem (AMBIEN) 5 MG tablet Take 1 tablet (5 mg total) by mouth at bedtime as needed for sleep. 10/30/20   Hoyt Koch, MD    Allergies    Patient has no known allergies.  Review of Systems    Review of Systems  Constitutional:  Negative for chills and fever.  HENT:  Negative for ear pain and sore throat.   Eyes:  Negative for pain and visual disturbance.  Respiratory:  Negative for cough and shortness of breath.   Cardiovascular:  Negative for chest pain and palpitations.  Gastrointestinal:  Negative for abdominal pain and vomiting.  Genitourinary:  Negative for dysuria and hematuria.  Musculoskeletal:  Negative for arthralgias and back pain.       Toe pain  Skin:  Negative for color change and rash.  Neurological:  Negative for seizures and syncope.  All other systems reviewed and are negative.  Physical Exam Updated Vital Signs BP (!) 108/58 (BP Location: Right Arm)    Pulse 64    Temp 98.7 F (37.1 C)    Resp 18    Ht 5\' 2"  (1.575 m)    Wt 55.3 kg    SpO2 95%    BMI 22.31 kg/m   Physical Exam Vitals and nursing note reviewed.  Constitutional:      General: She is not in acute distress.    Appearance: She is well-developed.  HENT:     Head: Normocephalic and atraumatic.  Eyes:     Conjunctiva/sclera: Conjunctivae normal.  Cardiovascular:     Rate and Rhythm: Normal rate and regular rhythm.     Heart sounds: No murmur heard. Pulmonary:     Effort: Pulmonary effort is normal. No respiratory distress.     Breath sounds: Normal breath sounds.  Abdominal:     Palpations: Abdomen is soft.     Tenderness: There is no abdominal tenderness.  Musculoskeletal:        General: Tenderness (2nd toe left) and signs of injury present. No swelling.     Cervical back: Neck supple.  Skin:    General: Skin is warm and dry.     Capillary Refill: Capillary refill takes less than 2 seconds.  Neurological:  Mental Status: She is alert.  Psychiatric:        Mood and Affect: Mood normal.    ED Results / Procedures / Treatments   Labs (all labs ordered are listed, but only abnormal results are displayed) Labs Reviewed - No data to display  EKG None  Radiology DG  Foot Complete Left  Result Date: 04/14/2021 CLINICAL DATA:  Toe discoloration EXAM: LEFT FOOT - COMPLETE 3+ VIEW COMPARISON:  None. FINDINGS: No fracture or malalignment. Mild degenerative change at the first MTP joint. Vascular calcifications. Moderate plantar calcaneal spur IMPRESSION: No acute osseous abnormality Electronically Signed   By: Donavan Foil M.D.   On: 04/14/2021 18:55    Procedures Procedures   Medications Ordered in ED Medications - No data to display  ED Course  I have reviewed the triage vital signs and the nursing notes.  Pertinent labs & imaging results that were available during my care of the patient were reviewed by me and considered in my medical decision making (see chart for details).    MDM Rules/Calculators/A&P                         \ Patient seen emergency department for evaluation of toe pain.  Physical exam reveals mild bruising and swelling over the left second digit.  No motor or sensory deficits.  X-ray with no fracture.  Patient presentation consistent with sprain of the toe , she was placed in a postop shoe and the toe was buddy taped to adjacent toe.  She was then discharged with outpatient follow-up.   Final Clinical Impression(s) / ED Diagnoses Final diagnoses:  Sprain of toe, initial encounter    Rx / DC Orders ED Discharge Orders     None        Xiomara Sevillano, Debe Coder, MD 04/14/21 2127

## 2021-04-16 ENCOUNTER — Other Ambulatory Visit: Payer: Self-pay | Admitting: Internal Medicine

## 2021-04-17 ENCOUNTER — Telehealth: Payer: Self-pay | Admitting: Cardiology

## 2021-04-17 NOTE — Telephone Encounter (Signed)
Returned call to Pt.  She has had a little blood from her nose, dry and some fresh.  She recently was started on Eliquis.  She is also taking aspirin.  Advised to use saline nasal spray to alleviate dryness.  Advised could use Afrin sparingly.  Advised would discuss with primary cardiology if Pt should discontinue aspirin now that she is on Eliquis.  Will reach out to Primary cards.

## 2021-04-17 NOTE — Telephone Encounter (Signed)
New Message:      Patient says when she wipes her nose, she is getting dry blood and fresh blood. She says she does not know what is going on, Patient says she is on Eliquis.   Pt c/o medication issue:  1. Name of Medication: Eliquis  2. How are you currently taking this medication (dosage and times per day)? 2 times a day  3. Are you having a reaction (difficulty breathing--STAT)? At times, some days worse  4. What is your medication issue? Wiping dry blood and fresh blood from her nose

## 2021-04-17 NOTE — Telephone Encounter (Signed)
Per Dr. Trilby Leaver aspirin.  Sent mychart message advising Pt.  Advised to contact this nurse if further issues.

## 2021-04-18 ENCOUNTER — Telehealth: Payer: Self-pay | Admitting: Internal Medicine

## 2021-04-18 NOTE — Telephone Encounter (Signed)
Brad calling in to give patients current weight which is 123lbs  Requesting to change sliding scale due to weight limit  CB # if needed [phone]

## 2021-04-19 ENCOUNTER — Other Ambulatory Visit: Payer: Self-pay | Admitting: Internal Medicine

## 2021-04-19 ENCOUNTER — Other Ambulatory Visit: Payer: Self-pay | Admitting: Emergency Medicine

## 2021-04-19 NOTE — Telephone Encounter (Signed)
See below

## 2021-04-20 NOTE — Telephone Encounter (Signed)
I am confused by this request. She is not on sliding scale insulin. What are they asking?

## 2021-04-23 ENCOUNTER — Telehealth: Payer: Self-pay | Admitting: Physician Assistant

## 2021-04-23 ENCOUNTER — Encounter (HOSPITAL_BASED_OUTPATIENT_CLINIC_OR_DEPARTMENT_OTHER): Payer: Self-pay | Admitting: Emergency Medicine

## 2021-04-23 ENCOUNTER — Other Ambulatory Visit: Payer: Self-pay

## 2021-04-23 ENCOUNTER — Inpatient Hospital Stay (HOSPITAL_BASED_OUTPATIENT_CLINIC_OR_DEPARTMENT_OTHER)
Admission: EM | Admit: 2021-04-23 | Discharge: 2021-04-26 | DRG: 070 | Disposition: A | Discharge status: Home Health | Payer: PPO | Attending: Family Medicine | Admitting: Family Medicine

## 2021-04-23 ENCOUNTER — Emergency Department (HOSPITAL_BASED_OUTPATIENT_CLINIC_OR_DEPARTMENT_OTHER): Payer: PPO

## 2021-04-23 DIAGNOSIS — G9341 Metabolic encephalopathy: Principal | ICD-10-CM | POA: Diagnosis present

## 2021-04-23 DIAGNOSIS — R195 Other fecal abnormalities: Secondary | ICD-10-CM

## 2021-04-23 DIAGNOSIS — G2581 Restless legs syndrome: Secondary | ICD-10-CM | POA: Diagnosis present

## 2021-04-23 DIAGNOSIS — J90 Pleural effusion, not elsewhere classified: Secondary | ICD-10-CM | POA: Diagnosis not present

## 2021-04-23 DIAGNOSIS — Z7901 Long term (current) use of anticoagulants: Secondary | ICD-10-CM | POA: Diagnosis not present

## 2021-04-23 DIAGNOSIS — D849 Immunodeficiency, unspecified: Secondary | ICD-10-CM | POA: Diagnosis not present

## 2021-04-23 DIAGNOSIS — K922 Gastrointestinal hemorrhage, unspecified: Secondary | ICD-10-CM | POA: Diagnosis present

## 2021-04-23 DIAGNOSIS — J439 Emphysema, unspecified: Secondary | ICD-10-CM | POA: Diagnosis not present

## 2021-04-23 DIAGNOSIS — I480 Paroxysmal atrial fibrillation: Secondary | ICD-10-CM | POA: Diagnosis not present

## 2021-04-23 DIAGNOSIS — R410 Disorientation, unspecified: Secondary | ICD-10-CM

## 2021-04-23 DIAGNOSIS — I517 Cardiomegaly: Secondary | ICD-10-CM | POA: Diagnosis not present

## 2021-04-23 DIAGNOSIS — E222 Syndrome of inappropriate secretion of antidiuretic hormone: Secondary | ICD-10-CM | POA: Diagnosis present

## 2021-04-23 DIAGNOSIS — Z9049 Acquired absence of other specified parts of digestive tract: Secondary | ICD-10-CM

## 2021-04-23 DIAGNOSIS — Z79899 Other long term (current) drug therapy: Secondary | ICD-10-CM

## 2021-04-23 DIAGNOSIS — I5032 Chronic diastolic (congestive) heart failure: Secondary | ICD-10-CM | POA: Diagnosis present

## 2021-04-23 DIAGNOSIS — R5383 Other fatigue: Secondary | ICD-10-CM | POA: Diagnosis not present

## 2021-04-23 DIAGNOSIS — Z86010 Personal history of colonic polyps: Secondary | ICD-10-CM

## 2021-04-23 DIAGNOSIS — M5136 Other intervertebral disc degeneration, lumbar region: Secondary | ICD-10-CM | POA: Diagnosis not present

## 2021-04-23 DIAGNOSIS — Q433 Congenital malformations of intestinal fixation: Secondary | ICD-10-CM | POA: Diagnosis not present

## 2021-04-23 DIAGNOSIS — A419 Sepsis, unspecified organism: Secondary | ICD-10-CM | POA: Diagnosis not present

## 2021-04-23 DIAGNOSIS — M329 Systemic lupus erythematosus, unspecified: Secondary | ICD-10-CM | POA: Diagnosis not present

## 2021-04-23 DIAGNOSIS — J470 Bronchiectasis with acute lower respiratory infection: Secondary | ICD-10-CM | POA: Diagnosis not present

## 2021-04-23 DIAGNOSIS — J9601 Acute respiratory failure with hypoxia: Secondary | ICD-10-CM | POA: Diagnosis not present

## 2021-04-23 DIAGNOSIS — E871 Hypo-osmolality and hyponatremia: Secondary | ICD-10-CM | POA: Diagnosis not present

## 2021-04-23 DIAGNOSIS — E875 Hyperkalemia: Secondary | ICD-10-CM | POA: Diagnosis present

## 2021-04-23 DIAGNOSIS — J449 Chronic obstructive pulmonary disease, unspecified: Secondary | ICD-10-CM | POA: Diagnosis present

## 2021-04-23 DIAGNOSIS — K589 Irritable bowel syndrome without diarrhea: Secondary | ICD-10-CM | POA: Diagnosis not present

## 2021-04-23 DIAGNOSIS — D649 Anemia, unspecified: Secondary | ICD-10-CM

## 2021-04-23 DIAGNOSIS — J849 Interstitial pulmonary disease, unspecified: Secondary | ICD-10-CM | POA: Diagnosis not present

## 2021-04-23 DIAGNOSIS — Z7952 Long term (current) use of systemic steroids: Secondary | ICD-10-CM | POA: Diagnosis not present

## 2021-04-23 DIAGNOSIS — R509 Fever, unspecified: Secondary | ICD-10-CM | POA: Diagnosis not present

## 2021-04-23 DIAGNOSIS — Z20822 Contact with and (suspected) exposure to covid-19: Secondary | ICD-10-CM | POA: Diagnosis not present

## 2021-04-23 DIAGNOSIS — D62 Acute posthemorrhagic anemia: Secondary | ICD-10-CM | POA: Diagnosis present

## 2021-04-23 DIAGNOSIS — R519 Headache, unspecified: Secondary | ICD-10-CM | POA: Diagnosis not present

## 2021-04-23 DIAGNOSIS — N390 Urinary tract infection, site not specified: Secondary | ICD-10-CM | POA: Diagnosis not present

## 2021-04-23 DIAGNOSIS — M419 Scoliosis, unspecified: Secondary | ICD-10-CM | POA: Diagnosis not present

## 2021-04-23 DIAGNOSIS — S2242XA Multiple fractures of ribs, left side, initial encounter for closed fracture: Secondary | ICD-10-CM | POA: Diagnosis not present

## 2021-04-23 DIAGNOSIS — J189 Pneumonia, unspecified organism: Secondary | ICD-10-CM | POA: Diagnosis not present

## 2021-04-23 LAB — CBC WITH DIFFERENTIAL/PLATELET
Abs Immature Granulocytes: 0.04 10*3/uL (ref 0.00–0.07)
Basophils Absolute: 0 10*3/uL (ref 0.0–0.1)
Basophils Relative: 0 %
Eosinophils Absolute: 0.4 10*3/uL (ref 0.0–0.5)
Eosinophils Relative: 5 %
HCT: 21.9 % — ABNORMAL LOW (ref 36.0–46.0)
Hemoglobin: 7.3 g/dL — ABNORMAL LOW (ref 12.0–15.0)
Immature Granulocytes: 1 %
Lymphocytes Relative: 6 %
Lymphs Abs: 0.5 10*3/uL — ABNORMAL LOW (ref 0.7–4.0)
MCH: 31.2 pg (ref 26.0–34.0)
MCHC: 33.3 g/dL (ref 30.0–36.0)
MCV: 93.6 fL (ref 80.0–100.0)
Monocytes Absolute: 0.7 10*3/uL (ref 0.1–1.0)
Monocytes Relative: 8 %
Neutro Abs: 6.8 10*3/uL (ref 1.7–7.7)
Neutrophils Relative %: 80 %
Platelets: 237 10*3/uL (ref 150–400)
RBC: 2.34 MIL/uL — ABNORMAL LOW (ref 3.87–5.11)
RDW: 14.9 % (ref 11.5–15.5)
WBC: 8.5 10*3/uL (ref 4.0–10.5)
nRBC: 0 % (ref 0.0–0.2)

## 2021-04-23 LAB — RESP PANEL BY RT-PCR (FLU A&B, COVID) ARPGX2
Influenza A by PCR: NEGATIVE
Influenza B by PCR: NEGATIVE
SARS Coronavirus 2 by RT PCR: NEGATIVE

## 2021-04-23 LAB — URINALYSIS, ROUTINE W REFLEX MICROSCOPIC
Bilirubin Urine: NEGATIVE
Glucose, UA: NEGATIVE mg/dL
Hgb urine dipstick: NEGATIVE
Ketones, ur: NEGATIVE mg/dL
Leukocytes,Ua: NEGATIVE
Nitrite: NEGATIVE
Protein, ur: NEGATIVE mg/dL
Specific Gravity, Urine: 1.009 (ref 1.005–1.030)
pH: 7 (ref 5.0–8.0)

## 2021-04-23 LAB — D-DIMER, QUANTITATIVE: D-Dimer, Quant: 0.61 ug/mL-FEU — ABNORMAL HIGH (ref 0.00–0.50)

## 2021-04-23 LAB — BASIC METABOLIC PANEL
Anion gap: 7 (ref 5–15)
BUN: 25 mg/dL — ABNORMAL HIGH (ref 8–23)
CO2: 28 mmol/L (ref 22–32)
Calcium: 9 mg/dL (ref 8.9–10.3)
Chloride: 89 mmol/L — ABNORMAL LOW (ref 98–111)
Creatinine, Ser: 1.36 mg/dL — ABNORMAL HIGH (ref 0.44–1.00)
GFR, Estimated: 42 mL/min — ABNORMAL LOW (ref >60)
Glucose, Bld: 84 mg/dL (ref 70–99)
Potassium: 5.7 mmol/L — ABNORMAL HIGH (ref 3.5–5.1)
Sodium: 124 mmol/L — ABNORMAL LOW (ref 135–145)

## 2021-04-23 LAB — HEPATIC FUNCTION PANEL
ALT: 22 U/L (ref 0–44)
AST: 30 U/L (ref 15–41)
Albumin: 3.7 g/dL (ref 3.5–5.0)
Alkaline Phosphatase: 60 U/L (ref 38–126)
Bilirubin, Direct: 0.1 mg/dL (ref 0.0–0.2)
Indirect Bilirubin: 0.6 mg/dL (ref 0.3–0.9)
Total Bilirubin: 0.7 mg/dL (ref 0.3–1.2)
Total Protein: 7.5 g/dL (ref 6.5–8.1)

## 2021-04-23 LAB — PROTIME-INR
INR: 1.5 — ABNORMAL HIGH (ref 0.8–1.2)
Prothrombin Time: 17.8 seconds — ABNORMAL HIGH (ref 11.4–15.2)

## 2021-04-23 LAB — BRAIN NATRIURETIC PEPTIDE: B Natriuretic Peptide: 533.7 pg/mL — ABNORMAL HIGH (ref 0.0–100.0)

## 2021-04-23 LAB — OCCULT BLOOD X 1 CARD TO LAB, STOOL: Fecal Occult Bld: POSITIVE — AB

## 2021-04-23 LAB — TSH: TSH: 1.577 u[IU]/mL (ref 0.350–4.500)

## 2021-04-23 LAB — AMMONIA: Ammonia: 26 umol/L (ref 9–35)

## 2021-04-23 LAB — TROPONIN I (HIGH SENSITIVITY): Troponin I (High Sensitivity): 27 ng/L — ABNORMAL HIGH (ref <18)

## 2021-04-23 LAB — MAGNESIUM: Magnesium: 2.3 mg/dL (ref 1.7–2.4)

## 2021-04-23 MED ORDER — PANTOPRAZOLE SODIUM 40 MG IV SOLR: 40.0000 mg | Freq: Two times a day (BID) | INTRAVENOUS | Status: DC

## 2021-04-23 MED ORDER — SODIUM ZIRCONIUM CYCLOSILICATE 5 G PO PACK
5.0000 g | PACK | Freq: Once | ORAL | Status: AC
  Administered 2021-04-23: 5 g via ORAL
  Filled 2021-04-23: qty 1

## 2021-04-23 MED ORDER — PANTOPRAZOLE INFUSION (NEW) - SIMPLE MED
8.0000 mg/h | INTRAVENOUS | Status: DC
  Administered 2021-04-23: 8 mg/h via INTRAVENOUS
  Filled 2021-04-23: qty 100

## 2021-04-23 MED ORDER — SODIUM CHLORIDE 0.9 % IV SOLN
500.0000 mg | Freq: Once | INTRAVENOUS | Status: AC
  Administered 2021-04-23: 500 mg via INTRAVENOUS
  Filled 2021-04-23: qty 5

## 2021-04-23 MED ORDER — PANTOPRAZOLE 80MG IVPB - SIMPLE MED
80.0000 mg | Freq: Once | INTRAVENOUS | Status: AC
  Administered 2021-04-23: 80 mg via INTRAVENOUS
  Filled 2021-04-23: qty 100

## 2021-04-23 MED ORDER — ACETAMINOPHEN 500 MG PO TABS
1000.0000 mg | ORAL_TABLET | Freq: Once | ORAL | Status: AC
  Administered 2021-04-23: 1000 mg via ORAL
  Filled 2021-04-23: qty 2

## 2021-04-23 MED ORDER — PANTOPRAZOLE SODIUM 40 MG IV SOLR
INTRAVENOUS | Status: AC
  Filled 2021-04-23: qty 160

## 2021-04-23 MED ORDER — SODIUM CHLORIDE 0.9 % IV SOLN
1.0000 g | Freq: Once | INTRAVENOUS | Status: AC
  Administered 2021-04-23: 1 g via INTRAVENOUS
  Filled 2021-04-23: qty 10

## 2021-04-23 MED ORDER — PANTOPRAZOLE 80MG IVPB - SIMPLE MED
80.0000 mg | Freq: Once | INTRAVENOUS | Status: DC
Start: 2021-04-23 — End: 2021-04-23

## 2021-04-23 NOTE — Telephone Encounter (Signed)
Paged by answering service. Patient has low grade fever, confusion, tremors and intermittent oxygen drop below 88s. I have recommended to call EMS or go to ER. Husband is agree with plan.

## 2021-04-23 NOTE — ED Triage Notes (Signed)
Fever x 2 days. Headache, fatigue. Husband states she also is in "brain fog" and last time this happened her sodium was low.

## 2021-04-23 NOTE — ED Notes (Signed)
Patient transported to CT 

## 2021-04-23 NOTE — ED Notes (Signed)
Husband in room , pt assisted up to walk to BR

## 2021-04-23 NOTE — H&P (Addendum)
History and Physical    Sheila Morales ZOX:[id number] DOB: [birth year] DOA: 04/23/2021  PCP: Hoyt Koch, MD  Patient coming from: Home via EMS from [place]   Chief Complaint:  Chief Complaint  Patient presents with   Fever     HPI:    71 year old female with past medical history of COPD, diastolic congestive heart failure (Echo 03/2021 EF 70-75% with G2DD), lupus, ischemic colitis, atrial fibrillation, SIADH with chronic hyponatremia, anemia of chronic disease, sick sinus syndrome status post pacemaker placement who presents to [place] with family with complaints of fever and confusion over the past 2 days.  Of note, patient was recently hospitalized at [facility] from 11/30 until 12/11.  Patient initially presented with fatigue and confusion with profound hyponatremia of 113.  Work-up revealed patient was suffering from E. coli bacteremia with evidence of a urinary tract infection and right-sided pyelonephritis based on CT imaging.  Hospital stay was additionally complicated by new onset rapid atrial fibrillation and acute congestive heart failure with the need for management in the intensive care unit temporarily and intermittent BiPAP therapy.  Patient was also diagnosed with possible early interstitial lung disease and was initiated on daily prednisone therapy by pulmonology.  Hyponatremia was felt to be secondary to SIADH managed with fluid restriction and salt tablets.  Hyponatremia improved (130 on date of DC), UTI was treated and patient was discharged on 12/11.  During the history is been obtained with both review of ER documentation as well as discussions with the husband.  Since Friday evening the patient began to develop increasing lethargy and intermittent confusion.  Is associated with complaints of headaches generalized myalgias and fatigue.  Husband reports the symptoms persisted over the course of the weekend and was associated with intermittent fevers  beginning on Sunday.-Patient denies any new complaints of abdominal pain, dysuria, shortness of breath, cough.  Patient has not had any recent sick contacts or contact with confirmed COVID-19 infection.    Due to persisting symptoms patient was eventually brought into med [place] via EMS for evaluation.  Upon evaluation in the emergency department patient was found to be febrile at 102.7 F on arrival.  Urinalysis was found to be unremarkable.  COVID-19 and influenza PCR testing were found to be negative.  Chest x-ray revealed interstitial infiltrates bilaterally.  BNP was found to be 533 CBC revealed a substantial anemia with hemoglobin of 7.3, down from 10.4 three weeks prior at time of discharge.  Patient was initiated on intravenous ceftriaxone, intravenous azithromycin, intravenous Protonix.  Patient was given [facility] for for mild hyperkalemia of 5.7.  The hospitalist group was then called to assess the patient for admission to the hospital.  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     Past Surgical History:  Procedure Laterality Date  CHOLECYSTECTOMY     CHOLECYSTECTOMY, LAPAROSCOPIC     COLONOSCOPY     ESOPHAGOGASTRODUODENOSCOPY     lesion, vulva excision     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   Procedure: PACEMAKER IMPLANT;  Surgeon: [name], MD;  Location: [place] [facility];  Service: Cardiovascular;   Laterality: N/A;   TONSILLECTOMY     VIDEO BRONCHOSCOPY Bilateral 11/06/2017   Procedure: VIDEO BRONCHOSCOPY WITHOUT FLUORO;  Surgeon: Marshell Garfinkel, MD;  Location: [facility];  Service: Cardiopulmonary;  Laterality: Bilateral;     reports that she has been smoking cigarettes. She has a 7.50 pack-year smoking history. She has never used smokeless tobacco. She reports current alcohol use of about 5.0 standard drinks per week. She reports that she does not use drugs.  No Known Allergies  Family History  Problem Relation Age of Onset   Stroke Mother        Deceased, 85s   Atrial fibrillation Father    Colon cancer Father    Other Sister        Pick's disease, deceased   Healthy Daughter      Prior to Admission medications   Medication Sig Start Date End Date Taking? Authorizing Provider  albuterol (VENTOLIN HFA) 108 (90 Base) MCG/ACT inhaler Inhale 1-2 puffs into the lungs every 6 (six) hours as needed for wheezing or shortness of breath. 10/13/20  Yes Byrum, Rose Fillers, MD  amiodarone (PACERONE) 200 MG tablet Take 1 tablet (200 mg total) by mouth 2 (two) times daily. 04/01/21 05/01/21 Yes Pahwani, Einar Grad, MD  ANORO ELLIPTA 62.5-25 MCG/ACT AEPB TAKE 1 PUFF BY MOUTH EVERY DAY 04/19/21  Yes Byrum, Rose Fillers, MD  apixaban (ELIQUIS) 5 MG TABS tablet Take 1 tablet (5 mg total) by mouth 2 (two) times daily. 04/01/21 05/01/21 Yes Pahwani, Einar Grad, MD  benzonatate (TESSALON) 200 MG capsule Take 1 capsule (200 mg total) by mouth every 6 (six) hours as needed for cough. 03/20/21  Yes Collene Gobble, MD  calcium carbonate (OS-CAL - DOSED IN MG OF ELEMENTAL CALCIUM) 1250 (500 Ca) MG tablet Take 1 tablet by mouth every evening.   Yes [provider]  Cholecalciferol (VITAMIN D) 50 MCG (2000 UT) tablet Take 2,000 Units by mouth every evening.   Yes [provider]  diltiazem (CARDIZEM CD) 360 MG 24 hr capsule Take 1 capsule (360 mg total) by mouth daily. 04/02/21 05/02/21 Yes Pahwani, Einar Grad,  MD  folic acid (FOLVITE) 1 MG tablet Take 1 mg by mouth every evening.   Yes [provider]  furosemide (LASIX) 20 MG tablet Take 1 tablet (20 mg total) by mouth daily. 04/02/21 05/02/21 Yes Pahwani, Einar Grad, MD  gabapentin (NEURONTIN) 300 MG capsule TAKE TWO CAPSULES BY MOUTH EVERY MORNING, TWO CAPSULES EACH AFTERNOON AND THREE CAPSULES AT BEDTIME Patient taking differently: Take 600-900 mg by mouth See admin instructions. 02/27/21  Yes Hoyt Koch, MD  hydroxychloroquine (PLAQUENIL) 200 MG tablet Take 200 mg by mouth daily.   Yes [provider]  methotrexate 2.5 MG tablet Take 10 mg by mouth every Thursday.   Yes [provider]  metoprolol tartrate (LOPRESSOR) 25 MG tablet Take 1 tablet (25 mg total) by mouth 2 (two) times daily. 04/01/21 05/01/21 Yes Pahwani, Einar Grad, MD  Misc Natural Products (ADVANCED JOINT RELIEF) CAPS Take 1 capsule by mouth daily. Joint Advantage Gold 5x   Yes [provider]  Multiple Vitamin (MULTIVITAMIN WITH MINERALS) TABS tablet Take 1 tablet by mouth daily.   Yes  [provider]  RESTASIS 0.05 % ophthalmic emulsion Place 1 drop into both eyes 2 (two) times daily. 07/30/17  Yes [provider]  vitamin C (ASCORBIC ACID) 500 MG tablet Take 500 mg by mouth daily.   Yes [provider]  vitamin E 180 MG (400 UNITS) capsule Take 400 Units by mouth daily.   Yes [provider]  zolpidem (AMBIEN) 5 MG tablet Take 1 tablet (5 mg total) by mouth at bedtime as needed for sleep. 10/30/20  Yes Hoyt Koch, MD  nystatin (MYCOSTATIN) 100000 UNIT/ML suspension Take 5 mLs (500,000 Units total) by mouth in the morning, at noon, and at bedtime. Patient not taking: Reported on 04/23/2021 04/10/21   Hoyt Koch, MD  predniSONE (DELTASONE) 10 MG tablet Take 30 mg (3 tabs) po daily for 5 days, followed by 20 mg (2 tabs) po daily for 5 days, followed by 10 mg po daily for 5 days. Patient not taking:  Reported on 04/23/2021 04/01/21   Darliss Cheney, MD    Physical Exam: Vitals:   04/23/21 2219 04/24/21 0037 04/24/21 0205 04/24/21 0222  BP: 127/65 120/62  (!) 102/51  Pulse: 73   80  Resp: 20 20 (!) 21 20  Temp: 99.6 F (37.6 C) (!) 101.3 F (38.5 C)  100 F (37.8 C)  TempSrc: Oral Oral  Oral  SpO2: 100% 98%  94%  Weight: 61.5 kg     Height:        Constitutional: Lethargic but arousable, oriented x3, patient is intermittently short of breath throughout the examination. Skin: no rashes, no lesions, somewhat poor skin turgor noted. Eyes: Pupils are equally reactive to light.  No evidence of scleral icterus or conjunctival pallor.  ENMT: Moist mucous membranes noted.  Posterior pharynx clear of any exudate or lesions.   Neck: normal, supple, no masses, no thyromegaly.  No evidence of jugular venous distension.   Respiratory: Significant bibasilar rales, no wheezing, Normal respiratory effort. No accessory muscle use.  Cardiovascular: Regular rate and rhythm, no murmurs / rubs / gallops. No extremity edema. 2+ pedal pulses. No carotid bruits.  Chest:   Nontender without crepitus or deformity.   Back:   Nontender without crepitus or deformity. Abdomen: Abdomen is soft and nontender.  No evidence of intra-abdominal masses.  Positive bowel sounds noted in all quadrants.   Musculoskeletal: No joint deformity upper and lower extremities. Good ROM, no contractures. Normal muscle tone.  Neurologic: Notably lethargic and somewhat forgetful throughout the interview however still oriented.  CN 2-12 grossly intact. Sensation intact.  Patient moving all 4 extremities spontaneously.  Patient is following all commands.  Patient is responsive to verbal stimuli.   Psychiatric: Quite lethargic and intermittently confused throughout the interview.  Flat affect with depressed mood.  Patient seems to possess insight as to her current situation.   Labs on Admission: I have personally reviewed following labs  and imaging studies -   CBC: Recent Labs  Lab 04/23/21 1041 04/23/21 2347  WBC 8.5 6.8  NEUTROABS 6.8  --   HGB 7.3* 10.1*  HCT 21.9* 30.5*  MCV 93.6 97.1  PLT 237 144   Basic Metabolic Panel: Recent Labs  Lab 04/23/21 1041 04/23/21 2347  NA 124* 126*  K 5.7* 4.7  CL 89* 95*  CO2 28 24  GLUCOSE 84 169*  BUN 25* 22  CREATININE 1.36* 1.30*  CALCIUM 9.0 8.0*  MG 2.3  --    GFR: Estimated Creatinine Clearance: 34.3 mL/min (  A) (by C-G formula based on SCr of 1.3 mg/dL (H)). Liver Function Tests: Recent Labs  Lab 04/23/21 1041  AST 30  ALT 22  ALKPHOS 60  BILITOT 0.7  PROT 7.5  ALBUMIN 3.7   No results for input(s): LIPASE, AMYLASE in the last 168 hours. Recent Labs  Lab 04/23/21 1141  AMMONIA 26   Coagulation Profile: Recent Labs  Lab 04/23/21 1141  INR 1.5*   Cardiac Enzymes: No results for input(s): CKTOTAL, CKMB, CKMBINDEX, TROPONINI in the last 168 hours. BNP (last 3 results) Recent Labs    03/21/21 1206  PROBNP 1,279.0*   HbA1C: No results for input(s): HGBA1C in the last 72 hours. CBG: No results for input(s): GLUCAP in the last 168 hours. Lipid Profile: No results for input(s): CHOL, HDL, LDLCALC, TRIG, CHOLHDL, LDLDIRECT in the last 72 hours. Thyroid Function Tests: Recent Labs    04/23/21 1111  TSH 1.577   Anemia Panel: No results for input(s): VITAMINB12, FOLATE, FERRITIN, TIBC, IRON, RETICCTPCT in the last 72 hours. Urine analysis:    Component Value Date/Time   COLORURINE YELLOW 04/23/2021 1600   APPEARANCEUR CLEAR 04/23/2021 1600   LABSPEC 1.009 04/23/2021 1600   PHURINE 7.0 04/23/2021 1600   GLUCOSEU NEGATIVE 04/23/2021 1600   HGBUR NEGATIVE 04/23/2021 1600   BILIRUBINUR NEGATIVE 04/23/2021 1600   KETONESUR NEGATIVE 04/23/2021 1600   PROTEINUR NEGATIVE 04/23/2021 1600   NITRITE NEGATIVE 04/23/2021 1600   LEUKOCYTESUR NEGATIVE 04/23/2021 1600    Radiological Exams on Admission - Personally Reviewed: CT Head Wo  Contrast  Result Date: 04/23/2021 CLINICAL DATA:  Headache and fatigue.  Fever for 2 days. EXAM: CT HEAD WITHOUT CONTRAST TECHNIQUE: Contiguous axial images were obtained from the base of the skull through the vertex without intravenous contrast. COMPARISON:  None. FINDINGS: Brain: No evidence of acute infarction, hemorrhage, hydrocephalus, extra-axial collection or mass lesion/mass effect. Patchy areas of white matter hypoattenuation are noted consistent with mild chronic microvascular ischemic change. Vascular: No hyperdense vessel or unexpected calcification. Skull: Normal. Negative for fracture or focal lesion. Sinuses/Orbits: Visualized globes and orbits are unremarkable. Visualized sinuses are clear. Other: None. IMPRESSION: 1. No acute intracranial abnormalities. Electronically Signed   By: Lajean Manes M.D.   On: 04/23/2021 11:38   DG Chest Portable 1 View  Result Date: 04/23/2021 CLINICAL DATA:  Fever for 2 days.  Headache and fatigue. EXAM: PORTABLE CHEST 1 VIEW COMPARISON:  03/30/2021. FINDINGS: Cardiac silhouette is mildly enlarged. Stable left anterior chest wall sequential pacemaker. No mediastinal or hilar masses. Bilateral interstitial type opacities. Subtle superimposed airspace opacity in the mid lungs. Additional lung base opacities consistent with atelectasis, with small effusion suspected. No pneumothorax. Skeletal structures grossly intact. IMPRESSION: 1. Interstitial with subtle hazy airspace opacities, similar to the prior chest radiograph. Residual or recurrent multifocal infection suspected. 2. Cardiomegaly.  Small effusions. Electronically Signed   By: Lajean Manes M.D.   On: 04/23/2021 11:34    EKG: Personally reviewed.  Rhythm is normal sinus rhythm with heart rate of 81.  No dynamic ST segment changes appreciated.  Assessment/Plan  * Acute metabolic encephalopathy- (present on admission) Patient presenting with 3-day history of progressively worsening lethargy and  intermittent confusion Likely multifactorial secondary to progressive infectious process considering patient's recurrent fevers. Mild hyponatremia may also be contributing but is unlikely to be the sole cause. Treating underlying suspected infection Working to correct patient's endometrium Metabolic encephalopathy should therefore resolve spontaneously, will continue to monitor closely. If encephalopathy fails to improve in the  neck several days we will expand work-up.  Acute blood loss anemia- (present on admission) Patient presenting with substantial anemia and hemoglobin of 7.3, down from 10.4 approximately 3 weeks ago While patient/husband deny any recent melena or hematemesis, patient is Hemoccult positive in the emergency department Repeat hemoglobin upon arrival to the medical floor was found to be 10.3, bringing the original hemoglobin into question. Slow gastrointestinal bleeding possibly secondary to Eliquis use in the setting of ongoing prednisone?  Clear liquid diet for now Protonix 40 mg IV every 12 Holding home regimen of Eliquis CBC every 6 hours  If hemoglobin drops again, will consult gastroenterology for consideration of potential endoscopic evaluation (follows with  in the outpatient setting) If hemoglobin remains stable after serial CBCs will advance diet, keep patient on daily PPI and resume Eliquis   Fever- (present on admission) Patient presenting with 3-day history of recurrent fevers No other SIRS criteria noted Pertinent past medical history of recent E. coli pyelonephritis with bacteremia during recent hospitalization several weeks ago Source is currently unclear Chest x-ray does reveal  interstitial infiltrates however this is unchanged compared to prior x-ray imaging Presents recently diagnosed interstitial lung disease is making diagnosis of pulmonary infection difficult.  We will obtain CT imaging of the chest for images to better delineate whether  there are new infiltrates Continuing ceftriaxone and azithromycin started in the emergency department in the meantime as we work patient up. Obtaining CRP, procalcitonin and blood cultures Urinalysis unremarkable COVID-19 and influenza PCR testing negative  Hyponatremia- (present on admission) Patient presenting with worsening hyponatremia of 124, down from a peak of 130 prior to patient's discharge Has been reports that patient is not really adhering to any strict fluid restriction since discharge Work-up of hyponatremia during last hospitalization was consistent with SIADH Will repeat work-up at this time with urine osmolality, serum osmolality and urine sodium Continue to monitor sodium levels with serial chemistries Will proceed with a modest fluid restriction of 1500 cc/day for now.  Hyperkalemia- (present on admission) Mild hyperkalemia noted in the emergency department with no EKG changes Lokelma was administered at that time Monitoring patient on telemetry Monitoring potassium levels with serial chemistries   AF (paroxysmal atrial fibrillation) ([place])- (present on admission) Diagnosed during recent hospitalization in early December Currently in normal sinus rhythm and rate controlled Monitoring patient on telemetry Continue home regimen of amiodarone, diltiazem and metoprolol  Interstitial lung disease ([place])- (present on admission) Suspicion of interstitial lung disease by pulmonology during last hospitalization Patient was discharged on a prednisone taper which is since been completed Persisting interstitial infiltrates on this presentation may very well be secondary to patient's known interstitial lung disease As needed Bronchodilators for shortness of breath and wheezing   Chronic diastolic CHF (congestive heart failure) ([place])- (present on admission) While patient does exhibit a somewhat elevated BNP, this is dramatically lower than what the BNP was during her previous  hospitalization Interstitial infiltrates on chest x-ray are more likely secondary to interstitial lung disease or an infectious process rather than pulmonary edema. No clinical evidence of cardiogenic volume overload     Systemic lupus erythematosus ([place])- (present on admission) Continue home regimen of methotrexate and hydroxychloroquine Ongoing use of methotrexate places patient in an immunocompromised state.  COPD (chronic obstructive pulmonary disease) ([place])- (present on admission) No evidence of COPD exacerbation this time Continue home regimen of maintenance inhalers As needed bronchodilator therapy for episodic shortness of breath and wheezing.       Code Status:  Full code  code status decision has been confirmed with: patient/husband Family Communication: Husband is at bedside who has been updated on plan of care  Status is: Inpatient  Patient's been placed inpatient status due to acute gastrointestinal bleeding requiring frequent blood testing, frequent clinical assessments, potential need for blood transfusions, expert consultation with gastroenterology.  Patient additionally exhibiting hyponatremia, hyperkalemia requiring frequent chemistries and work-up with administration of potassium lowering therapies and close monitoring on telemetry.       Vernelle Emerald MD [facility] Pager [phone]  If 7PM-7AM, please contact night-coverage [email or web address] Use universal  password for that web site. If you do not have the password, please call the hospital operator.  04/24/2021, 2:32 AM

## 2021-04-23 NOTE — ED Provider Notes (Signed)
[facility] DEPT Provider Note   CSN: [id number] Arrival date & time: 04/23/21  4098     History  Chief Complaint  Patient presents with   Fever    Sheila Morales is a 71 y.o. female.   Fever  71 year old female with a past medical history significant for lupus, ischemic colitis, IBS, hemorrhoids, COPD not on home O2, anticoagulated on Eliquis who presents to the emergency department with fever for the past 2 days, headache, myalgias, fatigue, intermittent confusion.  The history was provided with the aid of the patient's husband.  The patient had difficulty contributing to her history due to some confusion.  She arrived alert and oriented x2, disoriented to year, complaining of generalized fatigue.  She reportedly had had prior problems with hyponatremia requiring admission to the hospital for sodium was as low as 118.  She does have a history of heart failure and has had some bilateral lower extremity swelling that has been resolving with outpatient diuretics over the past few days.  She denies any cough, endorses mild dyspnea in addition to her fever.  No chest pain.  She denies any dark tarry stools or melena or hematochezia.  No abdominal pain, nausea or vomiting.  Home Medications Prior to Admission medications   Medication Sig Start Date End Date Taking? Authorizing Provider  albuterol (VENTOLIN HFA) 108 (90 Base) MCG/ACT inhaler Inhale 1-2 puffs into the lungs every 6 (six) hours as needed for wheezing or shortness of breath. 10/13/20   Byrum, Rose Fillers, MD  amiodarone (PACERONE) 200 MG tablet Take 1 tablet (200 mg total) by mouth 2 (two) times daily. 04/01/21 05/01/21  Darliss Cheney, MD  ANORO ELLIPTA 62.5-25 MCG/ACT AEPB TAKE 1 PUFF BY MOUTH EVERY DAY 04/19/21   Collene Gobble, MD  apixaban (ELIQUIS) 5 MG TABS tablet Take 1 tablet (5 mg total) by mouth 2 (two) times daily. 04/01/21 05/01/21  Darliss Cheney, MD  benzonatate (TESSALON) 200 MG capsule Take 1  capsule (200 mg total) by mouth every 6 (six) hours as needed for cough. 03/20/21   Collene Gobble, MD  BLACK COHOSH PO Take 1 capsule by mouth daily.    [provider]  calcium carbonate (OS-CAL - DOSED IN MG OF ELEMENTAL CALCIUM) 1250 (500 Ca) MG tablet Take 1 tablet by mouth daily as needed (heartburn).    [provider]  Cholecalciferol (VITAMIN D) 50 MCG (2000 UT) tablet Take 2,000 Units by mouth daily.    [provider]  diltiazem (CARDIZEM CD) 360 MG 24 hr capsule Take 1 capsule (360 mg total) by mouth daily. 04/02/21 05/02/21  Darliss Cheney, MD  folic acid (FOLVITE) 1 MG tablet Take 1 mg by mouth daily.    [provider]  furosemide (LASIX) 20 MG tablet Take 1 tablet (20 mg total) by mouth daily. 04/02/21 05/02/21  Darliss Cheney, MD  gabapentin (NEURONTIN) 300 MG capsule TAKE TWO CAPSULES BY MOUTH EVERY MORNING, TWO CAPSULES EACH AFTERNOON AND THREE CAPSULES AT BEDTIME Patient taking differently: Take 900-1,200 mg by mouth See admin instructions. 900 mg in the morning, and 1200 mg at bedtime 02/27/21   Hoyt Koch, MD  hydroxychloroquine (PLAQUENIL) 200 MG tablet Take 200 mg by mouth daily.    [provider]  methotrexate 2.5 MG tablet Take 10 mg by mouth every Thursday.    [provider]  metoprolol tartrate (LOPRESSOR) 25 MG tablet Take 1 tablet (25 mg total) by mouth 2 (two) times daily. 04/01/21  05/01/21  Darliss Cheney, MD  Misc Natural Products (ADVANCED JOINT RELIEF) CAPS Take 1 capsule by mouth daily. Joint Advantage Gold 5x    [provider]  Multiple Vitamin (MULTIVITAMIN WITH MINERALS) TABS tablet Take 1 tablet by mouth daily.    [provider]  nystatin (MYCOSTATIN) 100000 UNIT/ML suspension Take 5 mLs (500,000 Units total) by mouth in the morning, at noon, and at bedtime. 04/10/21   Hoyt Koch, MD  Omega-3 Fatty Acids (OMEGA 3 PO) Take 1 capsule by mouth daily.    [provider]  OVER THE COUNTER MEDICATION Take 1 capsule by mouth daily. brain essentials otc supplement    [provider]  predniSONE (DELTASONE) 10 MG tablet Take 30 mg (3 tabs) po daily for 5 days, followed by 20 mg (2 tabs) po daily for 5 days, followed by 10 mg po daily for 5 days. 04/01/21   Darliss Cheney, MD  RESTASIS 0.05 % ophthalmic emulsion Place 1 drop into both eyes 2 (two) times daily. 07/30/17   [provider]  vitamin C (ASCORBIC ACID) 500 MG tablet Take 500 mg by mouth daily.    [provider]  vitamin E 180 MG (400 UNITS) capsule Take 400 Units by mouth daily.    [provider]  zolpidem (AMBIEN) 5 MG tablet Take 1 tablet (5 mg total) by mouth at bedtime as needed for sleep. 10/30/20   Hoyt Koch, MD      Allergies    Patient has no known allergies.    Review of Systems   Review of Systems  Unable to perform ROS: Mental status change  Constitutional:  Positive for fever.   Physical Exam Updated Vital Signs BP 103/65    Pulse 77    Temp 98.8 F (37.1 C) (Oral)    Resp (!) 24    Ht 5\' 2"  (1.575 m)    Wt 56.2 kg    SpO2 95%    BMI 22.68 kg/m  Physical Exam Vitals and nursing note reviewed. Exam conducted with a chaperone present.  Constitutional:      General: She is not in acute distress.    Appearance: She is well-developed.  HENT:     Head: Normocephalic and atraumatic.  Eyes:     Conjunctiva/sclera: Conjunctivae normal.  Neck:     Comments: Good range of motion of the neck with no meningismus. Cardiovascular:     Rate and Rhythm: Normal rate and regular rhythm.     Heart sounds: No murmur heard. Pulmonary:     Effort: Pulmonary effort is normal. No respiratory distress.     Breath sounds: Examination of the right-lower field reveals rales. Examination of the left-lower field reveals rales. Rales present.  Abdominal:     Palpations: Abdomen is soft.     Tenderness: There is no abdominal tenderness.  Genitourinary:     Rectum: Guaiac result positive.     Comments: No melena or hematochezia Musculoskeletal:        General: No swelling.     Cervical back: Neck supple.  Skin:    General: Skin is warm and dry.     Capillary Refill: Capillary refill takes less than 2 seconds.  Neurological:     Mental Status: She is alert. She is disoriented.     Comments: GCS 14, AAO x2, mildly disoriented.  No cranial nerve deficit, moving all 4 extremities spontaneously with 5 out of 5 strength, intact sensation in all 4 extremities.  Psychiatric:        Mood and Affect: Mood normal.    ED Results / Procedures / Treatments   Labs (all labs ordered are listed, but only abnormal results are displayed) Labs Reviewed  CBC WITH DIFFERENTIAL/PLATELET - Abnormal; Notable for the following components:      Result Value   RBC 2.34 (*)    Hemoglobin 7.3 (*)    HCT 21.9 (*)    Lymphs Abs 0.5 (*)    All other components within normal limits  BASIC METABOLIC PANEL - Abnormal; Notable for the following components:   Sodium 124 (*)    Potassium 5.7 (*)    Chloride 89 (*)    BUN 25 (*)    Creatinine, Ser 1.36 (*)    GFR, Estimated 42 (*)    All other components within normal limits  BRAIN NATRIURETIC PEPTIDE - Abnormal; Notable for the following components:   B Natriuretic Peptide 533.7 (*)    All other components within normal limits  D-DIMER, QUANTITATIVE (NOT AT [facility]) - Abnormal; Notable for the following components:   D-Dimer, Quant 0.61 (*)    All other components within normal limits  OCCULT BLOOD X 1 CARD TO LAB, STOOL - Abnormal; Notable for the following components:   Fecal Occult Bld POSITIVE (*)    All other components within normal limits  PROTIME-INR - Abnormal; Notable for the following components:   Prothrombin Time 17.8 (*)    INR 1.5 (*)    All other components within normal limits  TROPONIN I (HIGH SENSITIVITY) - Abnormal; Notable for the following components:   Troponin I (High Sensitivity) 27 (*)     All other components within normal limits  RESP PANEL BY RT-PCR (FLU A&B, COVID) ARPGX2  MAGNESIUM  TSH  AMMONIA  URINALYSIS, ROUTINE W REFLEX MICROSCOPIC  HEPATIC FUNCTION PANEL  TROPONIN I (HIGH SENSITIVITY)    EKG EKG Interpretation  Date/Time:  Monday April 23 2021 11:51:21 EST Ventricular Rate:  81 PR Interval:  109 QRS Duration: 97 QT Interval:  389 QTC Calculation: 452 R Axis:   25 Text Interpretation: Sinus rhythm Short PR interval RSR' in V1 or V2, right VCD or RVH Inferior infarct, age indeterminate Confirmed by Regan Lemming ([id number]) on 04/23/2021 12:26:06 PM  Radiology CT Head Wo Contrast  Result Date: 04/23/2021 CLINICAL DATA:  Headache and fatigue.  Fever for 2 days. EXAM: CT HEAD WITHOUT CONTRAST TECHNIQUE: Contiguous axial images were obtained from the base of the skull through the vertex without intravenous contrast. COMPARISON:  None. FINDINGS: Brain: No evidence of acute infarction, hemorrhage, hydrocephalus, extra-axial collection or mass lesion/mass effect. Patchy areas of white matter hypoattenuation are noted consistent with mild chronic microvascular ischemic change. Vascular: No hyperdense vessel or unexpected calcification. Skull: Normal. Negative for fracture or focal lesion. Sinuses/Orbits: Visualized globes and orbits are unremarkable. Visualized sinuses are clear. Other: None. IMPRESSION: 1. No acute intracranial abnormalities. Electronically Signed   By: Lajean Manes M.D.   On: 04/23/2021 11:38   DG Chest Portable 1 View  Result Date: 04/23/2021 CLINICAL DATA:  Fever for 2 days.  Headache and fatigue. EXAM: PORTABLE CHEST 1 VIEW COMPARISON:  03/30/2021. FINDINGS: Cardiac silhouette is mildly enlarged. Stable left anterior chest wall sequential pacemaker. No mediastinal or hilar masses. Bilateral interstitial type opacities. Subtle superimposed airspace opacity in the mid lungs. Additional lung base opacities consistent with atelectasis, with small effusion  suspected. No pneumothorax. Skeletal structures grossly intact. IMPRESSION: 1. Interstitial with subtle hazy airspace  opacities, similar to the prior chest radiograph. Residual or recurrent multifocal infection suspected. 2. Cardiomegaly.  Small effusions. Electronically Signed   By: Lajean Manes M.D.   On: 04/23/2021 11:34    Procedures Procedures    Medications Ordered in ED Medications  pantoprozole (PROTONIX) 80 mg /NS 100 mL infusion (8 mg/hr Intravenous New Bag/Given 04/23/21 1710)  pantoprazole (PROTONIX) injection 40 mg (has no administration in time range)  pantoprazole (PROTONIX) 40 MG injection (has no administration in time range)  acetaminophen (TYLENOL) tablet 1,000 mg (1,000 mg Oral Given 04/23/21 1049)  sodium zirconium cyclosilicate (LOKELMA) packet 5 g (5 g Oral Given 04/23/21 1316)  pantoprazole (PROTONIX) 80 mg /NS 100 mL IVPB (80 mg Intravenous New Bag/Given 04/23/21 1610)  cefTRIAXone (ROCEPHIN) 1 g in sodium chloride 0.9 % 100 mL IVPB (0 g Intravenous Stopped 04/23/21 1600)  azithromycin (ZITHROMAX) 500 mg in sodium chloride 0.9 % 250 mL IVPB (0 mg Intravenous Stopped 04/23/21 1600)    ED Course/ Medical Decision Making/ A&P Clinical Course as of 04/23/21 1827  Mon Apr 23, 2021  1114 Temp(!): 102.7 F (39.3 C) [JL]  1114 Resp(!): 24 [JL]  1114 Hemoglobin(!): 7.3 [JL]    Clinical Course User Index [JL] Regan Lemming, MD                           Medical Decision Making  71 year old female with a past medical history significant for lupus, ischemic colitis, IBS, hemorrhoids, COPD not on home O2, anticoagulated on Eliquis who presents to the emergency department with fever for the past 2 days, headache, myalgias, fatigue, intermittent confusion.  The history was provided with the aid of the patient's husband.  The patient had difficulty contributing to her history due to some confusion.  She arrived alert and oriented x2, disoriented to year, complaining of generalized  fatigue.  She reportedly had had prior problems with hyponatremia requiring admission to the hospital for sodium was as low as 118.  She does have a history of heart failure and has had some bilateral lower extremity swelling that has been resolving with outpatient diuretics over the past few days.  She denies any cough, endorses mild dyspnea in addition to her fever.  No chest pain.  She denies any dark tarry stools or melena or hematochezia.  No abdominal pain, nausea or vomiting.  On arrival, the patient was febrile to 1-2.7, not tachycardic, P71, mildly tachypneic RR 26, hemodynamically stable BP 101/84, saturating 96% on room air.  The patient had a physical exam concerning for bilateral rales in the lower bibasilar lung fields, no pitting edema, neurologic exam without focal deficit, concerning for mild confusion, AAO x2, GCS 14, fecal occult card positive on rectal exam without overt melena or hematochezia.  Differential diagnosis includes toxic metabolic encephalopathy in the setting of acute illness, COVID-19, influenza, other viral URI, pneumonia, electrolyte derangement, acute blood loss anemia/subacute GI bleed with a fecal occult positive in a patient on Eliquis.  An EKG revealed no acute ischemic changes and the patient had no abnormalities on cardiac telemetry.   Chest x-ray revealed no focal consolidation to suggest pneumonia.  Mild hazy interstitial opacities were present consistent with possible multifocal pneumonia versus pulmonary edema.  A CT head was performed revealed no acute intracranial abnormality.  A TSH was normal, and ammonia was normal, initial troponin was mildly elevated to 27, BNP was elevated to 533 although this is downtrending significantly from prior BnP measurements.  Fecal occult card was positive.  A D-dimer was elevated to 0.61 which was negative after age adjustment.  Hepatic function panel was normal.  A BMP revealed multiple electrolyte abnormalities to include  hyponatremia to 124, hyperkalemia to 5.7, hypochloremia to 89, mildly elevated BUN to 25.  A CBC was without a leukocytosis.  On CBC, the patient was found to have a three-point hemoglobin drop with a hemoglobin of 7.3 which was downtrending from around 10 3 weeks ago.  I am concerned for acute to subacute GI bleed in this patient.  IV access was obtained and the patient was administered IV Protonix bolus.  She was also covered for developing pneumonia with azithromycin and Rocephin.  She was administered Lokelma for her potassium of 5.7.  She had no peaked T waves on EKG.  Urinalysis was collected and was negative for UTI.  Given the patient's findings of pulmonary edema, Rales bilaterally, known history of mild heart failure requiring diuresis, I did not administer IV fluids and resuscitation.  The patient remained mildly confused in the emergency department.  She remained without meningeal findings.  Her fever defervesced following Tylenol.  She remained hemodynamically stable.  Etiology of the patient's altered mental status is likely multifactorial in the setting of acute illness and multiple metabolic derangements.  Patient exhibited no seizure activity in the emergency department.  She was found to have acute hypoxic respiratory failure on further assessment and subsequently placed on O2 via nasal cannula with improvement in her O2 saturations.  Hospitalist medicine was subsequently consulted for admission and accepted the patient in admission for further care and management.  The ultimate care of the patient was discussed bedside with the patient and her husband who are in agreement with the plan.     Final Clinical Impression(s) / ED Diagnoses Final diagnoses:  Disorientation  Hyponatremia  Anemia, unspecified type  Fecal occult blood test positive  Gastrointestinal hemorrhage, unspecified gastrointestinal hemorrhage type  Acute respiratory failure with hypoxia [facility])    Rx / DC Orders ED  Discharge Orders     None         Regan Lemming, MD 04/23/21 [phone]

## 2021-04-23 NOTE — Discharge Instructions (Addendum)
Advised to follow-up with primary care physician in 1 week. Advised to follow-up with pulmonologist Dr. Lamonte Sakai in 2 weeks. Take prednisone 40 mg daily x 14 days, then prednisone 30 mg x 14 days, then prednisone 20 mg x 14 days, then prednisone 15 mg x 14 days, then prednisone mg x 14 days, then prednisone 5 mg x 14 days.

## 2021-04-23 NOTE — ED Notes (Signed)
Husband in room , pt dozing , no c/o of pain

## 2021-04-24 ENCOUNTER — Inpatient Hospital Stay (HOSPITAL_COMMUNITY): Payer: PPO

## 2021-04-24 ENCOUNTER — Encounter (HOSPITAL_COMMUNITY): Payer: Self-pay | Admitting: Internal Medicine

## 2021-04-24 DIAGNOSIS — G9341 Metabolic encephalopathy: Secondary | ICD-10-CM | POA: Diagnosis present

## 2021-04-24 DIAGNOSIS — K922 Gastrointestinal hemorrhage, unspecified: Secondary | ICD-10-CM | POA: Diagnosis present

## 2021-04-24 DIAGNOSIS — J439 Emphysema, unspecified: Secondary | ICD-10-CM | POA: Diagnosis present

## 2021-04-24 DIAGNOSIS — J470 Bronchiectasis with acute lower respiratory infection: Secondary | ICD-10-CM | POA: Diagnosis present

## 2021-04-24 DIAGNOSIS — J189 Pneumonia, unspecified organism: Secondary | ICD-10-CM | POA: Diagnosis present

## 2021-04-24 DIAGNOSIS — G2581 Restless legs syndrome: Secondary | ICD-10-CM | POA: Diagnosis present

## 2021-04-24 DIAGNOSIS — Z86010 Personal history of colonic polyps: Secondary | ICD-10-CM | POA: Diagnosis not present

## 2021-04-24 DIAGNOSIS — Z79899 Other long term (current) drug therapy: Secondary | ICD-10-CM | POA: Diagnosis not present

## 2021-04-24 DIAGNOSIS — Z9049 Acquired absence of other specified parts of digestive tract: Secondary | ICD-10-CM | POA: Diagnosis not present

## 2021-04-24 DIAGNOSIS — Z20822 Contact with and (suspected) exposure to covid-19: Secondary | ICD-10-CM | POA: Diagnosis present

## 2021-04-24 DIAGNOSIS — I5032 Chronic diastolic (congestive) heart failure: Secondary | ICD-10-CM | POA: Diagnosis present

## 2021-04-24 DIAGNOSIS — Z7901 Long term (current) use of anticoagulants: Secondary | ICD-10-CM | POA: Diagnosis not present

## 2021-04-24 DIAGNOSIS — D849 Immunodeficiency, unspecified: Secondary | ICD-10-CM | POA: Diagnosis present

## 2021-04-24 DIAGNOSIS — D62 Acute posthemorrhagic anemia: Secondary | ICD-10-CM | POA: Diagnosis present

## 2021-04-24 DIAGNOSIS — Z7952 Long term (current) use of systemic steroids: Secondary | ICD-10-CM | POA: Diagnosis not present

## 2021-04-24 DIAGNOSIS — J9601 Acute respiratory failure with hypoxia: Secondary | ICD-10-CM | POA: Diagnosis present

## 2021-04-24 DIAGNOSIS — E871 Hypo-osmolality and hyponatremia: Secondary | ICD-10-CM | POA: Diagnosis present

## 2021-04-24 DIAGNOSIS — N390 Urinary tract infection, site not specified: Secondary | ICD-10-CM | POA: Diagnosis present

## 2021-04-24 DIAGNOSIS — I480 Paroxysmal atrial fibrillation: Secondary | ICD-10-CM | POA: Diagnosis present

## 2021-04-24 DIAGNOSIS — M329 Systemic lupus erythematosus, unspecified: Secondary | ICD-10-CM | POA: Diagnosis present

## 2021-04-24 DIAGNOSIS — E222 Syndrome of inappropriate secretion of antidiuretic hormone: Secondary | ICD-10-CM | POA: Diagnosis present

## 2021-04-24 DIAGNOSIS — K589 Irritable bowel syndrome without diarrhea: Secondary | ICD-10-CM | POA: Diagnosis present

## 2021-04-24 DIAGNOSIS — E875 Hyperkalemia: Secondary | ICD-10-CM | POA: Diagnosis present

## 2021-04-24 LAB — CBC WITH DIFFERENTIAL/PLATELET
Abs Immature Granulocytes: 0.03 10*3/uL (ref 0.00–0.07)
Basophils Absolute: 0 10*3/uL (ref 0.0–0.1)
Basophils Relative: 0 %
Eosinophils Absolute: 0.4 10*3/uL (ref 0.0–0.5)
Eosinophils Relative: 6 %
HCT: 28.6 % — ABNORMAL LOW (ref 36.0–46.0)
Hemoglobin: 9.6 g/dL — ABNORMAL LOW (ref 12.0–15.0)
Immature Granulocytes: 0 %
Lymphocytes Relative: 9 %
Lymphs Abs: 0.6 10*3/uL — ABNORMAL LOW (ref 0.7–4.0)
MCH: 31.9 pg (ref 26.0–34.0)
MCHC: 33.6 g/dL (ref 30.0–36.0)
MCV: 95 fL (ref 80.0–100.0)
Monocytes Absolute: 0.7 10*3/uL (ref 0.1–1.0)
Monocytes Relative: 10 %
Neutro Abs: 5.1 10*3/uL (ref 1.7–7.7)
Neutrophils Relative %: 75 %
Platelets: 180 10*3/uL (ref 150–400)
RBC: 3.01 MIL/uL — ABNORMAL LOW (ref 3.87–5.11)
RDW: 15 % (ref 11.5–15.5)
WBC: 6.9 10*3/uL (ref 4.0–10.5)
nRBC: 0 % (ref 0.0–0.2)

## 2021-04-24 LAB — BASIC METABOLIC PANEL
Anion gap: 7 (ref 5–15)
Anion gap: 7 (ref 5–15)
Anion gap: 7 (ref 5–15)
BUN: 22 mg/dL (ref 8–23)
BUN: 22 mg/dL (ref 8–23)
BUN: 23 mg/dL (ref 8–23)
CO2: 24 mmol/L (ref 22–32)
CO2: 26 mmol/L (ref 22–32)
CO2: 24 mmol/L (ref 22–32)
Calcium: 8 mg/dL — ABNORMAL LOW (ref 8.9–10.3)
Calcium: 8.2 mg/dL — ABNORMAL LOW (ref 8.9–10.3)
Calcium: 7.8 mg/dL — ABNORMAL LOW (ref 8.9–10.3)
Chloride: 95 mmol/L — ABNORMAL LOW (ref 98–111)
Chloride: 95 mmol/L — ABNORMAL LOW (ref 98–111)
Chloride: 97 mmol/L — ABNORMAL LOW (ref 98–111)
Creatinine, Ser: 1.3 mg/dL — ABNORMAL HIGH (ref 0.44–1.00)
Creatinine, Ser: 1.16 mg/dL — ABNORMAL HIGH (ref 0.44–1.00)
Creatinine, Ser: 1.14 mg/dL — ABNORMAL HIGH (ref 0.44–1.00)
GFR, Estimated: 44 mL/min — ABNORMAL LOW (ref >60)
GFR, Estimated: 50 mL/min — ABNORMAL LOW (ref >60)
GFR, Estimated: 51 mL/min — ABNORMAL LOW (ref >60)
Glucose, Bld: 169 mg/dL — ABNORMAL HIGH (ref 70–99)
Glucose, Bld: 98 mg/dL (ref 70–99)
Glucose, Bld: 80 mg/dL (ref 70–99)
Potassium: 4.7 mmol/L (ref 3.5–5.1)
Potassium: 5 mmol/L (ref 3.5–5.1)
Potassium: 4.1 mmol/L (ref 3.5–5.1)
Sodium: 126 mmol/L — ABNORMAL LOW (ref 135–145)
Sodium: 128 mmol/L — ABNORMAL LOW (ref 135–145)
Sodium: 128 mmol/L — ABNORMAL LOW (ref 135–145)

## 2021-04-24 LAB — CBC
HCT: 29.9 % — ABNORMAL LOW (ref 36.0–46.0)
HCT: 30.5 % — ABNORMAL LOW (ref 36.0–46.0)
Hemoglobin: 10.1 g/dL — ABNORMAL LOW (ref 12.0–15.0)
Hemoglobin: 9.8 g/dL — ABNORMAL LOW (ref 12.0–15.0)
MCH: 31.4 pg (ref 26.0–34.0)
MCH: 32.2 pg (ref 26.0–34.0)
MCHC: 32.8 g/dL (ref 30.0–36.0)
MCHC: 33.1 g/dL (ref 30.0–36.0)
MCV: 95.8 fL (ref 80.0–100.0)
MCV: 97.1 fL (ref 80.0–100.0)
Platelets: 181 K/uL (ref 150–400)
Platelets: 183 K/uL (ref 150–400)
RBC: 3.12 MIL/uL — ABNORMAL LOW (ref 3.87–5.11)
RBC: 3.14 MIL/uL — ABNORMAL LOW (ref 3.87–5.11)
RDW: 14.8 % (ref 11.5–15.5)
RDW: 15 % (ref 11.5–15.5)
WBC: 6.8 K/uL (ref 4.0–10.5)
WBC: 8.2 K/uL (ref 4.0–10.5)
nRBC: 0 % (ref 0.0–0.2)
nRBC: 0 % (ref 0.0–0.2)

## 2021-04-24 LAB — OSMOLALITY, URINE: Osmolality, Ur: 386 mOsm/kg (ref 300–900)

## 2021-04-24 LAB — COMPREHENSIVE METABOLIC PANEL
ALT: 21 U/L (ref 0–44)
AST: 20 U/L (ref 15–41)
Albumin: 3 g/dL — ABNORMAL LOW (ref 3.5–5.0)
Alkaline Phosphatase: 55 U/L (ref 38–126)
Anion gap: 8 (ref 5–15)
BUN: 24 mg/dL — ABNORMAL HIGH (ref 8–23)
CO2: 28 mmol/L (ref 22–32)
Calcium: 8.2 mg/dL — ABNORMAL LOW (ref 8.9–10.3)
Chloride: 93 mmol/L — ABNORMAL LOW (ref 98–111)
Creatinine, Ser: 1.27 mg/dL — ABNORMAL HIGH (ref 0.44–1.00)
GFR, Estimated: 45 mL/min — ABNORMAL LOW (ref >60)
Glucose, Bld: 107 mg/dL — ABNORMAL HIGH (ref 70–99)
Potassium: 5.3 mmol/L — ABNORMAL HIGH (ref 3.5–5.1)
Sodium: 129 mmol/L — ABNORMAL LOW (ref 135–145)
Total Bilirubin: 0.5 mg/dL (ref 0.3–1.2)
Total Protein: 6.3 g/dL — ABNORMAL LOW (ref 6.5–8.1)

## 2021-04-24 LAB — PROCALCITONIN: Procalcitonin: <0.1 ng/mL

## 2021-04-24 LAB — C-REACTIVE PROTEIN
CRP: 10.6 mg/dL — ABNORMAL HIGH (ref <1.0)
CRP: 10.2 mg/dL — ABNORMAL HIGH (ref <1.0)

## 2021-04-24 LAB — TROPONIN I (HIGH SENSITIVITY): Troponin I (High Sensitivity): 29 ng/L — ABNORMAL HIGH

## 2021-04-24 LAB — MAGNESIUM: Magnesium: 2.1 mg/dL (ref 1.7–2.4)

## 2021-04-24 LAB — SODIUM, URINE, RANDOM: Sodium, Ur: 27 mmol/L

## 2021-04-24 LAB — OSMOLALITY: Osmolality: 272 mOsm/kg — ABNORMAL LOW (ref 275–295)

## 2021-04-24 LAB — POTASSIUM: Potassium: 4.8 mmol/L (ref 3.5–5.1)

## 2021-04-24 LAB — SEDIMENTATION RATE: Sed Rate: 65 mm/hr — ABNORMAL HIGH (ref 0–22)

## 2021-04-24 MED ORDER — BENZONATATE 100 MG PO CAPS: 200.0000 mg | ORAL_CAPSULE | Freq: Four times a day (QID) | ORAL | Status: DC | PRN

## 2021-04-24 MED ORDER — SODIUM CHLORIDE 0.9 % IV SOLN
500.0000 mg | INTRAVENOUS | Status: DC
  Administered 2021-04-24 – 2021-04-25 (×2): 500 mg via INTRAVENOUS
  Filled 2021-04-24 (×3): qty 5

## 2021-04-24 MED ORDER — SODIUM CHLORIDE 0.9 % IV BOLUS
250.0000 mL | Freq: Once | INTRAVENOUS | Status: AC
  Administered 2021-04-24: 250 mL via INTRAVENOUS

## 2021-04-24 MED ORDER — GABAPENTIN 300 MG PO CAPS
600.0000 mg | ORAL_CAPSULE | Freq: Two times a day (BID) | ORAL | Status: DC
  Administered 2021-04-24: 600 mg via ORAL
  Filled 2021-04-24: qty 2

## 2021-04-24 MED ORDER — ACETAMINOPHEN 325 MG PO TABS
650.0000 mg | ORAL_TABLET | ORAL | Status: DC | PRN
  Administered 2021-04-24 (×2): 650 mg via ORAL
  Filled 2021-04-24 (×2): qty 2

## 2021-04-24 MED ORDER — SULFAMETHOXAZOLE-TRIMETHOPRIM 400-80 MG PO TABS
1.0000 | ORAL_TABLET | ORAL | Status: DC
  Administered 2021-04-25: 1 via ORAL
  Filled 2021-04-24 (×2): qty 1

## 2021-04-24 MED ORDER — GABAPENTIN 300 MG PO CAPS
300.0000 mg | ORAL_CAPSULE | Freq: Three times a day (TID) | ORAL | Status: DC
  Administered 2021-04-24 – 2021-04-26 (×6): 300 mg via ORAL
  Filled 2021-04-24 (×6): qty 1

## 2021-04-24 MED ORDER — FOLIC ACID 1 MG PO TABS
1.0000 mg | ORAL_TABLET | Freq: Every evening | ORAL | Status: DC
  Administered 2021-04-24 – 2021-04-25 (×2): 1 mg via ORAL
  Filled 2021-04-24 (×2): qty 1

## 2021-04-24 MED ORDER — DILTIAZEM HCL ER COATED BEADS 180 MG PO CP24
360.0000 mg | ORAL_CAPSULE | Freq: Every day | ORAL | Status: DC
  Administered 2021-04-24 – 2021-04-26 (×2): 360 mg via ORAL
  Filled 2021-04-24 (×3): qty 2

## 2021-04-24 MED ORDER — GABAPENTIN 300 MG PO CAPS: 600.0000 mg | ORAL_CAPSULE | ORAL | Status: DC

## 2021-04-24 MED ORDER — SODIUM CHLORIDE 0.9 % IV SOLN
1.0000 g | INTRAVENOUS | Status: DC
  Administered 2021-04-24 – 2021-04-25 (×2): 1 g via INTRAVENOUS
  Filled 2021-04-24 (×3): qty 10

## 2021-04-24 MED ORDER — METHOTREXATE 2.5 MG PO TABS: 10.0000 mg | ORAL_TABLET | ORAL | Status: DC

## 2021-04-24 MED ORDER — HYDROXYCHLOROQUINE SULFATE 200 MG PO TABS
200.0000 mg | ORAL_TABLET | Freq: Every day | ORAL | Status: DC
  Administered 2021-04-24 – 2021-04-26 (×3): 200 mg via ORAL
  Filled 2021-04-24 (×3): qty 1

## 2021-04-24 MED ORDER — SODIUM CHLORIDE (PF) 0.9 % IJ SOLN
INTRAMUSCULAR | Status: AC
  Filled 2021-04-24: qty 50

## 2021-04-24 MED ORDER — GABAPENTIN 300 MG PO CAPS
900.0000 mg | ORAL_CAPSULE | Freq: Every day | ORAL | Status: DC
  Administered 2021-04-24: 900 mg via ORAL
  Filled 2021-04-24: qty 3

## 2021-04-24 MED ORDER — POLYETHYLENE GLYCOL 3350 17 G PO PACK: 17.0000 g | PACK | Freq: Every day | ORAL | Status: DC | PRN

## 2021-04-24 MED ORDER — UMECLIDINIUM-VILANTEROL 62.5-25 MCG/ACT IN AEPB
1.0000 | INHALATION_SPRAY | Freq: Every day | RESPIRATORY_TRACT | Status: DC
  Administered 2021-04-24 – 2021-04-26 (×3): 1 via RESPIRATORY_TRACT
  Filled 2021-04-24: qty 14

## 2021-04-24 MED ORDER — IOHEXOL 350 MG/ML SOLN
80.0000 mL | Freq: Once | INTRAVENOUS | Status: AC | PRN
  Administered 2021-04-24: 60 mL via INTRAVENOUS

## 2021-04-24 MED ORDER — PREDNISONE 20 MG PO TABS
40.0000 mg | ORAL_TABLET | Freq: Every day | ORAL | Status: DC
  Administered 2021-04-24 – 2021-04-26 (×3): 40 mg via ORAL
  Filled 2021-04-24 (×3): qty 2

## 2021-04-24 MED ORDER — METOPROLOL TARTRATE 25 MG PO TABS
25.0000 mg | ORAL_TABLET | Freq: Two times a day (BID) | ORAL | Status: DC
  Administered 2021-04-24 – 2021-04-26 (×5): 25 mg via ORAL
  Filled 2021-04-24 (×6): qty 1

## 2021-04-24 MED ORDER — ALBUTEROL SULFATE (2.5 MG/3ML) 0.083% IN NEBU
2.5000 mg | INHALATION_SOLUTION | RESPIRATORY_TRACT | Status: DC | PRN
  Administered 2021-04-24: 2.5 mg via RESPIRATORY_TRACT
  Filled 2021-04-24: qty 3

## 2021-04-24 MED ORDER — AMIODARONE HCL 200 MG PO TABS
200.0000 mg | ORAL_TABLET | Freq: Two times a day (BID) | ORAL | Status: DC
  Administered 2021-04-24 – 2021-04-26 (×6): 200 mg via ORAL
  Filled 2021-04-24 (×6): qty 1

## 2021-04-24 MED ORDER — ONDANSETRON HCL 4 MG/2ML IJ SOLN: 4.0000 mg | Freq: Four times a day (QID) | INTRAMUSCULAR | Status: DC | PRN

## 2021-04-24 MED ORDER — CYCLOSPORINE 0.05 % OP EMUL
1.0000 [drp] | Freq: Two times a day (BID) | OPHTHALMIC | Status: DC
  Administered 2021-04-24 – 2021-04-26 (×5): 1 [drp] via OPHTHALMIC
  Filled 2021-04-24 (×5): qty 30

## 2021-04-24 MED ORDER — ZOLPIDEM TARTRATE 5 MG PO TABS: 5.0000 mg | ORAL_TABLET | Freq: Every evening | ORAL | Status: DC | PRN

## 2021-04-24 MED ORDER — APIXABAN 5 MG PO TABS
5.0000 mg | ORAL_TABLET | Freq: Two times a day (BID) | ORAL | Status: DC
  Administered 2021-04-24 – 2021-04-26 (×4): 5 mg via ORAL
  Filled 2021-04-24 (×4): qty 1

## 2021-04-24 MED ORDER — ONDANSETRON HCL 4 MG PO TABS: 4.0000 mg | ORAL_TABLET | Freq: Four times a day (QID) | ORAL | Status: DC | PRN

## 2021-04-24 MED ORDER — SODIUM ZIRCONIUM CYCLOSILICATE 5 G PO PACK
5.0000 g | PACK | Freq: Once | ORAL | Status: AC
  Administered 2021-04-24: 5 g via ORAL
  Filled 2021-04-24: qty 1

## 2021-04-24 NOTE — Assessment & Plan Note (Signed)
   No evidence of COPD exacerbation this time  Continue home regimen of maintenance inhalers  As needed bronchodilator therapy for episodic shortness of breath and wheezing.  

## 2021-04-24 NOTE — Progress Notes (Signed)
MD is aware of Pt's BP and new orders placed for 250 Fluid Bolus. Bolus hung and will monitor Pt closely. Pt resting quietly at this time

## 2021-04-24 NOTE — Assessment & Plan Note (Signed)
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

## 2021-04-24 NOTE — Assessment & Plan Note (Signed)
·   Suspicion of interstitial lung disease by pulmonology during last hospitalization  Patient was discharged on a prednisone taper which is since been completed  Persisting interstitial infiltrates on this presentation may very well be secondary to patient's known interstitial lung disease  As needed Bronchodilators for shortness of breath and wheezing

## 2021-04-24 NOTE — Assessment & Plan Note (Signed)
•   While patient does exhibit a somewhat elevated BNP, this is dramatically lower than what the BNP was during her previous hospitalization  Interstitial infiltrates on chest x-ray are more likely secondary to interstitial lung disease or an infectious process rather than pulmonary edema.  No clinical evidence of cardiogenic volume overload

## 2021-04-24 NOTE — Assessment & Plan Note (Signed)
·   Patient presenting with 3-day history of progressively worsening lethargy and intermittent confusion  Likely multifactorial secondary to progressive infectious process considering patient's recurrent fevers.  Mild hyponatremia may also be contributing but is unlikely to be the sole cause.  Treating underlying suspected infection  Working to correct patient's endometrium  Metabolic encephalopathy should therefore resolve spontaneously, will continue to monitor closely.  If encephalopathy fails to improve in the neck several days we will expand work-up.

## 2021-04-24 NOTE — Assessment & Plan Note (Addendum)
·   Patient presenting with 3-day history of recurrent fevers  No other SIRS criteria noted  Pertinent past medical history of recent E. coli pyelonephritis with bacteremia during recent hospitalization several weeks ago  Source is currently unclear  Chest x-ray does reveal  interstitial infiltrates however this is unchanged compared to prior x-ray imaging  Presents recently diagnosed interstitial lung disease is making diagnosis of pulmonary infection difficult.  We will obtain CT imaging of the chest for images to better delineate whether there are new infiltrates  Continuing ceftriaxone and azithromycin started in the emergency department in the meantime as we work patient up.  Obtaining CRP, procalcitonin and blood cultures  Urinalysis unremarkable  COVID-19 and influenza PCR testing negative

## 2021-04-24 NOTE — Progress Notes (Signed)
PROGRESS NOTE    Sheila Morales  YBW:[id number] DOB: 1950/07/14 DOA: 04/23/2021 PCP: Hoyt Koch, MD   Brief Narrative:  This 71 years old female with PMH significant for COPD, diastolic congestive heart failure with recent echo showed EF 70 to 75% with grade 2 diastolic dysfunction, lupus, ischemic colitis, atrial fibrillation, SIADH with chronic hyponatremia, anemia of chronic disease, sick sinus syndrome status post pacemaker placement presented in ED with complaints of confusion over last 2 days.  Of note Patient was recently hospitalized at [facility] from 11/ 30-12/11.  Hospital course was remarkable for E. coli bacteremia secondary to UTI and right-sided pyelonephritis.  Patient has also developed new onset atrial fibrillation and acute congestive heart failure and was managed in the ICU and temporary requiring BiPAP.  She was also initiated on a daily prednisone therapy for interstitial lung disease by pulmonology.  Hyponatremia was felt to be secondary to SIADH managed with fluid restrictions and salt tablets. Patient has been confused and having intermittent fever for last few days at home.  Work-up in the ED showed UA unremarkable , Chest x-ray revealed interstitial infiltrate bilaterally.  BNP 533, CBC shows hemoglobin 7.3 down from 10.43weeks ago.  Patient is started on ceftriaxone and Zithromax for possible multifocal pneumonia.  She was also given Lokelma for hyperkalemia which has improved. Patient is admitted for acute metabolic encephalopathy, work-up is in process.  Assessment & Plan:   Principal Problem:   Acute metabolic encephalopathy Active Problems:   Systemic lupus erythematosus (HCC)   COPD (chronic obstructive pulmonary disease) (HCC)   Hyponatremia   Chronic diastolic CHF (congestive heart failure) (HCC)   AF (paroxysmal atrial fibrillation) (HCC)   Hyperkalemia   Acute blood loss anemia   Fever   Interstitial lung disease ([place])  Acute metabolic  encephalopathy: Patient presented with 3-day history of progressively worsening lethargy and intermittent confusion. Likely multifactorial secondary to progressive infectious process. Patient has chronic hyponatremia secondary to SIADH. Mild hyponatremia is also contributing but unlikely the sole cause. CT head: No acute intracranial abnormality noted. UA unremarkable, chest x-ray showed residual infection. Continue empiric antibiotics ceftriaxone and Zithromax. Procalcitonin normal.  Patient continued to spike fever. Follow-up blood cultures. Mental status is improving  Acute blood loss anemia: Patient presented with significant drop in Hb 7.3 from 10.4 Patient and family denies any melena or hematemesis. Stool for occult blood positive in ED. Repeat hemoglobin 10.3 appears normal. Slow gastrointestinal bleeding could be secondary to Eliquis use. Continue Protonix 40 mg every 12 hours. Hemoglobin remained stable. May consider GI for consideration of potential endoscopic evaluation if hemoglobin drops further.  Fever: Patient presented with history of recurrent fevers. There is no other SIRS criteria met. Patient was recently treated for E. coli bacteremia. Chest x-ray reveals interstitial infiltrate however this is unchanged from prior. CT chest showed findings consistent with multifocal pneumonia. Continue empiric antibiotics ceftriaxone and Zithromax. Procalcitonin normal, follow blood cultures.  Hyponatremia: Patient does have chronic hyponatremia due to SIADH. Patient has been not adhering to any strict fluid restrictions since discharge. Continue fluid restrictions, serum sodium is improving. Sodium 124> 129>130  Hyperkalemia: Mild hyperkalemia with no EKG changes noted. Patient has received Lokelma x1. Serum potassium improved.  Paroxysmal atrial fibrillation It was diagnosed during recent hospitalization. Currently patient is normal sinus rhythm and rate is  controlled. Continue amiodarone, diltiazem, metoprolol and Eliquis.  Insterstitial lung disease: She was diagnosed with institutional lung disease by pulmonology on last admission. Patient was discharged on prednisone  taper which she has completed. Continue as needed bronchodilators.  Chronic diastolic CHF: BNP slightly elevated, this is dramatically lower than prior hospitalization BNP. There is no clinical evidence of volume overloaded noted.  SLE: Continue home regimen of methotrexate and hydroxychlorquine  COPD: No evidence of exacerbation at this time.   Continue home regimen of maintenance inhalers.   DVT prophylaxis: SCDs Code Status: Full code. Family Communication: Husband at bed side. Disposition Plan:   Status is: Inpatient  Remains inpatient appropriate because:  Admitted for acute encephalopathy associated with fever could be multifactorial.  Consultants:  None  Procedures: CTA chest abdomen and pelvis Antimicrobials:   Anti-infectives (From admission, onward)    Start     Dose/Rate Route Frequency Ordered Stop   04/24/21 1400  cefTRIAXone (ROCEPHIN) 1 g in sodium chloride 0.9 % 100 mL IVPB        1 g 200 mL/hr over 30 Minutes Intravenous Every 24 hours 04/24/21 0232     04/24/21 1400  azithromycin (ZITHROMAX) 500 mg in sodium chloride 0.9 % 250 mL IVPB        500 mg 250 mL/hr over 60 Minutes Intravenous Every 24 hours 04/24/21 0232     04/24/21 1000  hydroxychloroquine (PLAQUENIL) tablet 200 mg        200 mg Oral Daily 04/24/21 0046     04/23/21 1400  azithromycin (ZITHROMAX) 500 mg in sodium chloride 0.9 % 250 mL IVPB        500 mg 250 mL/hr over 60 Minutes Intravenous  Once 04/23/21 1357 04/23/21 1600   04/23/21 1345  cefTRIAXone (ROCEPHIN) 1 g in sodium chloride 0.9 % 100 mL IVPB        1 g 200 mL/hr over 30 Minutes Intravenous  Once 04/23/21 1332 04/23/21 1600       Subjective: Patient was seen and examined at bedside.  Overnight events noted.    Patient reports feeling better. She still has some shortness of breath.   She denies any chest pain but reports shortness of breath.  Hemoglobin remained stable.  Objective: Vitals:   04/24/21 0630 04/24/21 0927 04/24/21 1047 04/24/21 1127  BP: 119/75 120/72 138/73   Pulse: 77 74 79   Resp: (!) 22  (!) 21   Temp: 99.3 F (37.4 C)  (!) 100.6 F (38.1 C)   TempSrc: Oral     SpO2: 96%  95% (!) 89%  Weight:      Height:        Intake/Output Summary (Last 24 hours) at 04/24/2021 1150 Last data filed at 04/24/2021 1045 Gross per 24 hour  Intake 204.13 ml  Output 800 ml  Net -595.87 ml   Filed Weights   04/23/21 1010 04/23/21 2219  Weight: 56.2 kg 61.5 kg    Examination:  General exam: Appears comfortable, not in any acute distress, deconditioned. Respiratory system: Clear to auscultation bilaterally, respiratory effort normal, RR 16. Cardiovascular system: S1-S2 heard, regular rate and rhythm, no murmur. Gastrointestinal system: Abdomen is soft, nontender, nondistended, BS+ Central nervous system: Alert and oriented x 3. No focal neurological deficits. Extremities: No edema, no cyanosis, no clubbing. Skin: No rashes, lesions or ulcers Psychiatry: Judgement and insight appear normal. Mood & affect appropriate.     Data Reviewed: I have personally reviewed following labs and imaging studies  CBC: Recent Labs  Lab 04/23/21 1041 04/23/21 2347 04/24/21 0507  WBC 8.5 6.8 6.9  NEUTROABS 6.8  --  5.1  HGB 7.3* 10.1* 9.6*  HCT  21.9* 30.5* 28.6*  MCV 93.6 97.1 95.0  PLT 237 181 619   Basic Metabolic Panel: Recent Labs  Lab 04/23/21 1041 04/23/21 2347 04/24/21 0507 04/24/21 0906  NA 124* 126* 129*  --   K 5.7* 4.7 5.3* 4.8  CL 89* 95* 93*  --   CO2 28 24 28   --   GLUCOSE 84 169* 107*  --   BUN 25* 22 24*  --   CREATININE 1.36* 1.30* 1.27*  --   CALCIUM 9.0 8.0* 8.2*  --   MG 2.3  --  2.1  --    GFR: Estimated Creatinine Clearance: 35.1 mL/min (A) (by C-G  formula based on SCr of 1.27 mg/dL (H)). Liver Function Tests: Recent Labs  Lab 04/23/21 1041 04/24/21 0507  AST 30 20  ALT 22 21  ALKPHOS 60 55  BILITOT 0.7 0.5  PROT 7.5 6.3*  ALBUMIN 3.7 3.0*   No results for input(s): LIPASE, AMYLASE in the last 168 hours. Recent Labs  Lab 04/23/21 1141  AMMONIA 26   Coagulation Profile: Recent Labs  Lab 04/23/21 1141  INR 1.5*   Cardiac Enzymes: No results for input(s): CKTOTAL, CKMB, CKMBINDEX, TROPONINI in the last 168 hours. BNP (last 3 results) Recent Labs    03/21/21 1206  PROBNP 1,279.0*   HbA1C: No results for input(s): HGBA1C in the last 72 hours. CBG: No results for input(s): GLUCAP in the last 168 hours. Lipid Profile: No results for input(s): CHOL, HDL, LDLCALC, TRIG, CHOLHDL, LDLDIRECT in the last 72 hours. Thyroid Function Tests: Recent Labs    04/23/21 1111  TSH 1.577   Anemia Panel: No results for input(s): VITAMINB12, FOLATE, FERRITIN, TIBC, IRON, RETICCTPCT in the last 72 hours. Sepsis Labs: Recent Labs  Lab 04/24/21 0132  PROCALCITON <0.10    Recent Results (from the past 240 hour(s))  Resp Panel by RT-PCR (Flu A&B, Covid) Nasopharyngeal Swab     Status: None   Collection Time: 04/23/21 10:41 AM   Specimen: Nasopharyngeal Swab; Nasopharyngeal(NP) swabs in vial transport medium  Result Value Ref Range Status   SARS Coronavirus 2 by RT PCR NEGATIVE NEGATIVE Final    Comment: (NOTE) SARS-CoV-2 target nucleic acids are NOT DETECTED.  The SARS-CoV-2 RNA is generally detectable in upper respiratory specimens during the acute phase of infection. The lowest concentration of SARS-CoV-2 viral copies this assay can detect is 138 copies/mL. A negative result does not preclude SARS-Cov-2 infection and should not be used as the sole basis for treatment or other patient management decisions. A negative result may occur with  improper specimen collection/handling, submission of specimen other than  nasopharyngeal swab, presence of viral mutation(s) within the areas targeted by this assay, and inadequate number of viral copies(<138 copies/mL). A negative result must be combined with clinical observations, patient history, and epidemiological information. The expected result is Negative.  Fact Sheet for Patients:  EntrepreneurPulse.com.au  Fact Sheet for Healthcare Providers:  IncredibleEmployment.be  This test is no t yet approved or cleared by the [place] FDA and  has been authorized for detection and/or diagnosis of SARS-CoV-2 by FDA under an Emergency Use Authorization (EUA). This EUA will remain  in effect (meaning this test can be used) for the duration of the COVID-19 declaration under Section 564(b)(1) of the Act, 21 U.S.C.section 360bbb-3(b)(1), unless the authorization is terminated  or revoked sooner.       Influenza A by PCR NEGATIVE NEGATIVE Final   Influenza B by PCR NEGATIVE NEGATIVE Final  Comment: (NOTE) The Xpert Xpress SARS-CoV-2/FLU/RSV plus assay is intended as an aid in the diagnosis of influenza from Nasopharyngeal swab specimens and should not be used as a sole basis for treatment. Nasal washings and aspirates are unacceptable for Xpert Xpress SARS-CoV-2/FLU/RSV testing.  Fact Sheet for Patients: EntrepreneurPulse.com.au  Fact Sheet for Healthcare Providers: IncredibleEmployment.be  This test is not yet approved or cleared by the [place] FDA and has been authorized for detection and/or diagnosis of SARS-CoV-2 by FDA under an Emergency Use Authorization (EUA). This EUA will remain in effect (meaning this test can be used) for the duration of the COVID-19 declaration under Section 564(b)(1) of the Act, 21 U.S.C. section 360bbb-3(b)(1), unless the authorization is terminated or revoked.  Performed at [facility], [address] [place], [place] [address]     Radiology Studies: CT Head Wo Contrast  Result Date: 04/23/2021 CLINICAL DATA:  Headache and fatigue.  Fever for 2 days. EXAM: CT HEAD WITHOUT CONTRAST TECHNIQUE: Contiguous axial images were obtained from the base of the skull through the vertex without intravenous contrast. COMPARISON:  None. FINDINGS: Brain: No evidence of acute infarction, hemorrhage, hydrocephalus, extra-axial collection or mass lesion/mass effect. Patchy areas of white matter hypoattenuation are noted consistent with mild chronic microvascular ischemic change. Vascular: No hyperdense vessel or unexpected calcification. Skull: Normal. Negative for fracture or focal lesion. Sinuses/Orbits: Visualized globes and orbits are unremarkable. Visualized sinuses are clear. Other: None. IMPRESSION: 1. No acute intracranial abnormalities. Electronically Signed   By: Lajean Manes M.D.   On: 04/23/2021 11:38   CT CHEST ABDOMEN PELVIS W CONTRAST  Result Date: 04/24/2021 CLINICAL DATA:  71 year old female with sepsis, recurrent fever, immunocompromised. EXAM: CT CHEST, ABDOMEN, AND PELVIS WITH CONTRAST TECHNIQUE: Multidetector CT imaging of the chest, abdomen and pelvis was performed following the standard protocol during bolus administration of intravenous contrast. CONTRAST:  76m OMNIPAQUE IOHEXOL 350 MG/ML SOLN COMPARISON:  Portable chest 04/23/2021. CT Chest, Abdomen, and Pelvis 03/26/2021. FINDINGS: CT CHEST FINDINGS Cardiovascular: Left chest transvenous pacemaker device. Calcified coronary artery atherosclerosis. Calcified aortic atherosclerosis. Left atrial enlargement suspected but overall cardiac size within normal limits. No pericardial effusion. Major mediastinal vascular structures appear to be patent. Mediastinum/Nodes: Reactive appearing mediastinal and hilar lymph nodes, individually up to 13 mm short axis anterior to the carina. Lungs/Pleura: Bilateral pleural effusions have regressed, trace  residual pleural fluid now. Major airways are patent and lung volumes are larger. Regressed but not resolved bilateral upper lobe pulmonary opacity, ground-glass type and confluent now greater on the right. Superimposed emphysema and lung scarring including some areas of early subpleural honeycombing. Improved bibasilar ventilation with patchy residual lower lobe peribronchial opacity. Musculoskeletal: Chronic left anterior rib fractures. No acute or suspicious osseous abnormality identified. CT ABDOMEN PELVIS FINDINGS Hepatobiliary: Absent gallbladder. Liver and bile ducts within normal limits. Pancreas: Negative. Spleen: Negative. Adrenals/Urinary Tract: Normal adrenal glands. Nonobstructed kidneys with symmetric renal enhancement and contrast excretion although there is on going asymmetric right pararenal space inflammation and/or fluid with simple fluid density (series 5, image 58). But no evidence of acute pyelonephritis. And ureters appear to remain normal. Larger but unremarkable bladder. Stomach/Bowel: Retained stool and diverticulosis of the large bowel in the descending and sigmoid segments. No active inflammation. Redundant transverse colon with similar retained stool. Negative right colon with some residual oral contrast suspected. Fluid in the terminal ileum which is within normal limits. Normal appendix visible on coronal image 57. No dilated small bowel. Decompressed stomach and duodenum. No free  air, free fluid, or mesenteric inflammation. Vascular/Lymphatic: Extensive Aortoiliac calcified atherosclerosis. Major arterial structures remain patent. Portal venous system appears to be patent. No lymphadenopathy identified. Reproductive: Negative. Other: No pelvic free fluid. Musculoskeletal: Lumbar disc and endplate degeneration associated with mild levoconvex scoliosis. No acute or suspicious osseous process identified. IMPRESSION: 1. Regressed but not resolved confluent bilateral upper lobe predominant  lung opacity, mostly ground-glass. Favor regressed multifocal pneumonia. Underlying chronic lung disease including Emphysema (ICD10-J43.9). Decreased pleural effusions, trace residual. Reactive appearing mediastinal and hilar lymph nodes. 2. Persistent asymmetric right pararenal space fluid and/or inflammation but no CT evidence of pyelonephritis or obstructive uropathy. 3. No other acute or inflammatory process identified in the chest, abdomen, or pelvis. Aortic Atherosclerosis (ICD10-I70.0). Electronically Signed   By: Genevie Ann M.D.   On: 04/24/2021 09:35   DG Chest Portable 1 View  Result Date: 04/23/2021 CLINICAL DATA:  Fever for 2 days.  Headache and fatigue. EXAM: PORTABLE CHEST 1 VIEW COMPARISON:  03/30/2021. FINDINGS: Cardiac silhouette is mildly enlarged. Stable left anterior chest wall sequential pacemaker. No mediastinal or hilar masses. Bilateral interstitial type opacities. Subtle superimposed airspace opacity in the mid lungs. Additional lung base opacities consistent with atelectasis, with small effusion suspected. No pneumothorax. Skeletal structures grossly intact. IMPRESSION: 1. Interstitial with subtle hazy airspace opacities, similar to the prior chest radiograph. Residual or recurrent multifocal infection suspected. 2. Cardiomegaly.  Small effusions. Electronically Signed   By: Lajean Manes M.D.   On: 04/23/2021 11:34    Scheduled Meds:  amiodarone  200 mg Oral BID   cycloSPORINE  1 drop Both Eyes BID   diltiazem  360 mg Oral Daily   folic acid  1 mg Oral QPM   gabapentin  600 mg Oral BID WC   And   gabapentin  900 mg Oral QHS   hydroxychloroquine  200 mg Oral Daily   [START ON 04/26/2021] methotrexate  10 mg Oral Q Thu   metoprolol tartrate  25 mg Oral BID   [START ON 04/27/2021] pantoprazole  40 mg Intravenous Q12H   umeclidinium-vilanterol  1 puff Inhalation Daily   Continuous Infusions:  azithromycin     cefTRIAXone (ROCEPHIN)  IV       LOS: 0 days    Time spent: 50  mins    Skylee Baird, MD [facility]   If 7PM-7AM, please contact night-coverage

## 2021-04-24 NOTE — TOC Progression Note (Signed)
Transition of Care [facility]) - Progression Note    Patient Details  Name: Sheila Morales MRN: [id number] Date of Birth: 1950-11-24  Transition of Care [facility]) CM/SW Contact  Purcell Mouton, RN Phone Number: 04/24/2021, 1:58 PM  Clinical Narrative:     TOC reviewed chart and will continue to follow for discharge needs.   Expected Discharge Plan: [place] Barriers to Discharge: No Barriers Identified  Expected Discharge Plan and Services Expected Discharge Plan: [place] arrangements for the past 2 months: Single Family Home                                       Social Determinants of Health (SDOH) Interventions    Readmission Risk Interventions No flowsheet data found.

## 2021-04-24 NOTE — Plan of Care (Signed)
°  Problem: Respiratory: Goal: Ability to maintain adequate ventilation will improve 04/24/2021 0431 by Tish Frederickson, RN Outcome: Progressing 04/24/2021 0429 by Angelica Pou, Trilby Drummer, RN Outcome: Progressing Goal: Ability to maintain a clear airway will improve Outcome: Progressing

## 2021-04-24 NOTE — Consult Note (Signed)
NAME:  Sheila Morales, MRN:  [id number], DOB:  [birth year], LOS: 0 ADMISSION DATE:  04/23/2021, CONSULTATION DATE:  04/24/21 REFERRING MD:  TRH, CHIEF COMPLAINT:  SOB, cough, fever   History of Present Illness:  71 y.o. woman whom we are seeing in consultation for evaluation of recurrent hypoxemic respiratory failure and abnormal CT scan.  H&P reviewed.  Discharge summary from last admission 03/2021 reviewed.  Most recent pulmonary consult note 03/2021 reviewed.  Patient was admitted 03/21/2021 with complaints of fatigue weakness and altered mental status.  She was admitted with heart failure exacerbation and AKI as well as acute on chronic hyponatremia.  She was also found to have E. coli bacteremia.  These were treated.  Course companied by A. fib with RVR and worsening mentation so transferred to the ICU.  A. fib was adequately treated with beta-blocker, calcium channel blocker, addition of low-dose amiodarone.  CT scan 03/26/2021 was personally reviewed interpreted as bilateral fluffy upper lobe or upper lung zone predominant groundglass opacities relative sparing of the periphery with bilateral right greater than left pleural effusions most concerning for volume overload with possible superimposed pneumonia.  She was treated with antibiotics and steroids.  Notably, she had a CT scan 03/21/21 with small area of fibrosis right upper lobe in the posterior segment.  She was discharged from the hospital on prednisone taper and improved and was no longer needing oxygen.  They state her oxygen saturations were in the high 90s.  She completed a prednisone taper and a couple days later started developing cough, shortness of breath.  This led to progressive hypoxemia.  This led to presentation to the emergency room.  She was found to hypoxemic placed on 4 to 5 L nasal cannula.  CT chest abdomen pelvis obtained on my interpretation CT chest demonstrates marked improvement in bilateral infiltrates from 03/26/2021 with  marked progression and fibrotic appearing area the right upper lobe compared to 03/21/2021 with scattered groundglass opacities as well as evidence of volume loss with what appears to be traction bronchiectasis with mild interlobular septal thickening in the bilateral lower lobes worse on the right.   She has no leukocytosis.  She is febrile.  Her procalcitonin is negative.  She was started on antibiotics today.   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 rate 20, height 5\' 2"  (1.575 m), weight 61.5 kg, SpO2 95 %.        Intake/Output Summary (Last 24 hours) at 04/24/2021 1530 Last data filed at 04/24/2021 1045 Gross per 24 hour  Intake 204.13 ml  Output 800 ml  Net -595.87 ml   Filed Weights   04/23/21 1010 04/23/21 2219  Weight: 56.2 kg 61.5 kg    Examination: General: Chronically ill-appearing, lying in hospital bed Eyes: EOMI, no icterus Neck: Supple no JVP appreciated Pulmonary: Faint crackles scattered, mild increased work of breathing Cardiovascular: Regular rate and rhythm, no murmurs  Abdomen: Nondistended, bowel sounds present MSK: No synovitis, joint effusion Neuro: No focal weakness, sensation intact Psych: Normal mood, full affect, inattentive briefly at times   Resolved Hospital Problem list    N/a  Assessment & Plan:  Acute hypoxemic respite failure with progressive chronic pulmonary infiltrates with worsened fever and cough: Concerning for underlying inflammatory lung disease, favor NSIP given pattern and groundglass opacities.  Suspect prior infiltrates 03/2021 reflective of this process as well as volume overload given bilateral pleural effusions now improved.  Small area of what looks like fibrosis in the right upper lobe in the spring 2022.  Overall pattern improved from last admission but with progressive fibrosis particular in the right upper lobe as well as bronchiectasis and bilateral but right greater than left lower lobes.  No clear signs of pulmonary infection on CT scan on my interpretation.  Fear this is early fibrosis and signs of volume loss.  She has fever, productive cough worsening hypoxemia in the setting of steroid taper.  Procalcitonin is negative.  The differential also includes methotrexate lung toxicity although the pattern does not appear classic for this.  In addition, amiodarone toxicity is possible but given initial area of fibrosis predates amiodarone and the relative short duration of amiodarone administration this is felt unlikely. --Continue antibiotics per primary --prednisone 40 mg daily, plan for taper to follow --Given degree of hypoxemia, bronchoscopy risk of worsening hypoxemia outweighs any benefit --Consider biopsy in future if respiratory status improves --Recommend diuresing as kidney function allows, mild hyperchloremia and does not appear volume overloaded so no urgency in my mind --Sed rate, CRP, antidouble-stranded DNA, complements sent to evaluate for possible lupus related ILD --Start Bactrim for PJP prophylaxis given steroids added to already immunosuppression regimen of methotrexate and Plaquenil  Best Practice (right click and "Reselect all SmartList Selections" daily)   Per primary  Labs   CBC: Recent Labs  Lab 04/23/21 1041  04/23/21 2347 04/24/21 0507 04/24/21 1205  WBC 8.5 6.8 6.9 8.2  NEUTROABS 6.8  --  5.1  --   HGB 7.3* 10.1* 9.6* 9.8*  HCT 21.9* 30.5* 28.6* 29.9*  MCV 93.6 97.1 95.0 95.8  PLT 237 181 180 161    Basic Metabolic Panel: Recent Labs  Lab 04/23/21 1041 04/23/21 2347 04/24/21 0507 04/24/21 0906 04/24/21 1205  NA 124* 126* 129*  --  128*  K 5.7* 4.7 5.3* 4.8 5.0  CL 89* 95* 93*  --  95*  CO2 28 24 28   --  26  GLUCOSE 84 169* 107*  --  98  BUN 25* 22 24*  --  22  CREATININE 1.36* 1.30* 1.27*  --  1.16*  CALCIUM 9.0 8.0* 8.2*  --  8.2*  MG 2.3  --  2.1  --   --    GFR: Estimated Creatinine Clearance: 38.4 mL/min (A) (by C-G formula based on SCr of 1.16 mg/dL (H)). Recent Labs  Lab 04/23/21 1041 04/23/21 2347 04/24/21 0132 04/24/21 0507 04/24/21 1205  PROCALCITON  --   --  <0.10  --   --   WBC 8.5 6.8  --  6.9 8.2    Liver Function Tests: Recent Labs  Lab 04/23/21 1041 04/24/21 0507  AST 30 20  ALT 22 21  ALKPHOS 60 55  BILITOT 0.7 0.5  PROT 7.5 6.3*  ALBUMIN 3.7 3.0*   No results for input(s): LIPASE, AMYLASE in the last 168 hours. Recent Labs  Lab 04/23/21 1141  AMMONIA 26    ABG  Component Value Date/Time   PHART 7.382 03/24/2021 0900   PCO2ART 37.1 03/24/2021 0900   PO2ART 73.5 (L) 03/24/2021 0900   HCO3 21.6 03/24/2021 0900   TCO2 23 03/21/2021 2118   ACIDBASEDEF 2.7 (H) 03/24/2021 0900   O2SAT 94.4 03/24/2021 0900     Coagulation Profile: Recent Labs  Lab 04/23/21 1141  INR 1.5*    Cardiac Enzymes: No results for input(s): CKTOTAL, CKMB, CKMBINDEX, TROPONINI in the last 168 hours.  HbA1C: Hgb A1c MFr Bld  Date/Time Value Ref Range Status  02/24/2017 01:51 PM 5.7 4.6 - 6.5 % Final    Comment:    Glycemic Control Guidelines for People with Diabetes:Non Diabetic:  <6%Goal of Therapy: <7%Additional Action Suggested:  >8%     CBG: No results for input(s): GLUCAP in the last 168 hours.  Review of Systems:   No chest pain  exertion, no orthopnea or PND.  Comprehensive review of systems otherwise negative.  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   Surgical History:   Past Surgical History:  Procedure Laterality Date   CHOLECYSTECTOMY     CHOLECYSTECTOMY, LAPAROSCOPIC     COLONOSCOPY     ESOPHAGOGASTRODUODENOSCOPY     lesion, vulva excision     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   Procedure: PACEMAKER IMPLANT;  Surgeon: [name], MD;  Location: [facility];  Service: Cardiovascular;  Laterality: N/A;   TONSILLECTOMY     VIDEO BRONCHOSCOPY Bilateral 11/06/2017   Procedure: VIDEO BRONCHOSCOPY WITHOUT FLUORO;  Surgeon: Marshell Garfinkel, MD;  Location: [facility];  Service: Cardiopulmonary;  Laterality: Bilateral;     Social History:   reports that she has been smoking cigarettes. She has a 7.50 pack-year smoking history. She has never used smokeless tobacco. She reports current alcohol use of about 5.0 standard drinks per week. She reports that she does not use drugs.   Family History:  Her family history includes Atrial fibrillation in her father; Colon cancer in her father; Healthy in her daughter; Other in her sister; Stroke in her mother.   Allergies No Known Allergies   Home Medications  Prior to Admission medications   Medication Sig Start Date End Date Taking? Authorizing Provider  albuterol (VENTOLIN HFA) 108 (90 Base) MCG/ACT inhaler Inhale 1-2 puffs into the lungs every 6 (six) hours as needed for wheezing or shortness of breath. 10/13/20  Yes Byrum, Rose Fillers, MD  amiodarone  (PACERONE) 200 MG tablet Take 1 tablet (200 mg total) by mouth 2 (two) times daily. 04/01/21 05/01/21 Yes Pahwani, Einar Grad, MD  ANORO ELLIPTA 62.5-25 MCG/ACT AEPB TAKE 1 PUFF BY MOUTH EVERY DAY 04/19/21  Yes Byrum, Rose Fillers, MD  apixaban (ELIQUIS) 5 MG TABS tablet Take 1 tablet (5 mg total) by mouth 2 (two) times daily. 04/01/21 05/01/21 Yes Pahwani, Einar Grad, MD  benzonatate (TESSALON) 200 MG capsule Take 1 capsule (200 mg total) by mouth every 6 (six) hours as needed for cough. 03/20/21  Yes Collene Gobble, MD  calcium carbonate (OS-CAL - DOSED IN MG OF ELEMENTAL CALCIUM) 1250 (500 Ca) MG tablet Take 1 tablet by mouth every evening.   Yes [provider]  Cholecalciferol (VITAMIN D)  50 MCG (2000 UT) tablet Take 2,000 Units by mouth every evening.   Yes [provider]  diltiazem (CARDIZEM CD) 360 MG 24 hr capsule Take 1 capsule (360 mg total) by mouth daily. 04/02/21 05/02/21 Yes Pahwani, Einar Grad, MD  folic acid (FOLVITE) 1 MG tablet Take 1 mg by mouth every evening.   Yes [provider]  furosemide (LASIX) 20 MG tablet Take 1 tablet (20 mg total) by mouth daily. 04/02/21 05/02/21 Yes Pahwani, Einar Grad, MD  gabapentin (NEURONTIN) 300 MG capsule TAKE TWO CAPSULES BY MOUTH EVERY MORNING, TWO CAPSULES EACH AFTERNOON AND THREE CAPSULES AT BEDTIME Patient taking differently: Take 600-900 mg by mouth See admin instructions. 02/27/21  Yes Hoyt Koch, MD  hydroxychloroquine (PLAQUENIL) 200 MG tablet Take 200 mg by mouth daily.   Yes [provider]  methotrexate 2.5 MG tablet Take 10 mg by mouth every Thursday.   Yes [provider]  metoprolol tartrate (LOPRESSOR) 25 MG tablet Take 1 tablet (25 mg total) by mouth 2 (two) times daily. 04/01/21 05/01/21 Yes Pahwani, Einar Grad, MD  Misc Natural Products (ADVANCED JOINT RELIEF) CAPS Take 1 capsule by mouth daily. Joint Advantage Gold 5x   Yes [provider]  Multiple Vitamin (MULTIVITAMIN WITH MINERALS) TABS tablet  Take 1 tablet by mouth daily.   Yes [provider]  RESTASIS 0.05 % ophthalmic emulsion Place 1 drop into both eyes 2 (two) times daily. 07/30/17  Yes [provider]  vitamin C (ASCORBIC ACID) 500 MG tablet Take 500 mg by mouth daily.   Yes [provider]  vitamin E 180 MG (400 UNITS) capsule Take 400 Units by mouth daily.   Yes [provider]  zolpidem (AMBIEN) 5 MG tablet Take 1 tablet (5 mg total) by mouth at bedtime as needed for sleep. 10/30/20  Yes Hoyt Koch, MD  nystatin (MYCOSTATIN) 100000 UNIT/ML suspension Take 5 mLs (500,000 Units total) by mouth in the morning, at noon, and at bedtime. Patient not taking: Reported on 04/23/2021 04/10/21   Hoyt Koch, MD  predniSONE (DELTASONE) 10 MG tablet Take 30 mg (3 tabs) po daily for 5 days, followed by 20 mg (2 tabs) po daily for 5 days, followed by 10 mg po daily for 5 days. Patient not taking: Reported on 04/23/2021 04/01/21   Darliss Cheney, MD     Critical care time: n/a   Lanier Clam, MD See Shea Evans for contact info   I spent 85 minutes in the care of the patient including review of records, face-to-face encounter, coordination of care.

## 2021-04-24 NOTE — Assessment & Plan Note (Signed)
·   Continue home regimen of methotrexate and hydroxychloroquine  Ongoing use of methotrexate places patient in an immunocompromised state.

## 2021-04-24 NOTE — Assessment & Plan Note (Signed)
·   Diagnosed during recent hospitalization in early December  Currently in normal sinus rhythm and rate controlled  Monitoring patient on telemetry  Continue home regimen of amiodarone, diltiazem and metoprolol

## 2021-04-24 NOTE — Assessment & Plan Note (Addendum)
·   Patient presenting with substantial anemia and hemoglobin of 7.3, down from 10.4 approximately 3 weeks ago  While patient/husband deny any recent melena or hematemesis, patient is Hemoccult positive in the emergency department  Repeat hemoglobin upon arrival to the medical floor was found to be 10.3, bringing the original hemoglobin into question.  Slow gastrointestinal bleeding possibly secondary to Eliquis use in the setting of ongoing prednisone?   Clear liquid diet for now  Protonix 40 mg IV every 12  Holding home regimen of Eliquis  CBC every 6 hours   If hemoglobin drops again, will consult gastroenterology for consideration of potential endoscopic evaluation (follows with [facility] in the outpatient setting)  If hemoglobin remains stable after serial CBCs will advance diet, keep patient on daily PPI and resume Eliquis

## 2021-04-24 NOTE — Plan of Care (Signed)
°  Problem: Respiratory: °Goal: Ability to maintain adequate ventilation will improve °Outcome: Progressing °  °

## 2021-04-24 NOTE — Progress Notes (Signed)
°   04/24/21 1047  Assess: MEWS Score  Temp (!) 100.6 F (38.1 C)  BP 138/73  Pulse Rate 79  Resp (!) 21  SpO2 95 %  O2 Device Nasal Cannula  Assess: MEWS Score  MEWS Temp 1  MEWS Systolic 0  MEWS Pulse 0  MEWS RR 1  MEWS LOC 0  MEWS Score 2  MEWS Score Color Yellow  Assess: if the MEWS score is Yellow or Red  Were vital signs taken at a resting state? Yes  Focused Assessment No change from prior assessment  Does the patient meet 2 or more of the SIRS criteria? Yes  Does the patient have a confirmed or suspected source of infection? No  Take Vital Signs  Increase Vital Sign Frequency  Yellow: Q 2hr X 2 then Q 4hr X 2, if remains yellow, continue Q 4hrs  Escalate  MEWS: Escalate Yellow: discuss with charge nurse/RN and consider discussing with provider and RRT  Notify: Charge Nurse/RN  Name of Charge Nurse/RN Notified Janett Billow RN  Date Charge Nurse/RN Notified 04/24/21  Time Charge Nurse/RN Notified 27  Notify: Provider  Provider Name/Title Dr. Dwyane Dee  Date Provider Notified 04/24/21  Time Provider Notified 1100  Notification Type Page (sent secure chat)  Notification Reason Other (Comment) (yellow muse and temperature)  Date of Provider Response 04/24/21  Time of Provider Response 1204  Assess: SIRS CRITERIA  SIRS Temperature  0  SIRS Pulse 0  SIRS Respirations  1  SIRS WBC 0  SIRS Score Sum  1

## 2021-04-24 NOTE — Assessment & Plan Note (Signed)
·   Mild hyperkalemia noted in the emergency department with no EKG changes  Lokelma was administered at that time  Monitoring patient on telemetry  Monitoring potassium levels with serial chemistries

## 2021-04-24 NOTE — Progress Notes (Signed)
Pt sitting up in bed Alert and Oriented x4. Blood Pressure 85/48 Pulse 76. Pt voices no complaints of distress or pain. No other acute changes with pt's assessment. MD sent secure chat.

## 2021-04-25 ENCOUNTER — Telehealth: Payer: Self-pay | Admitting: Pulmonary Disease

## 2021-04-25 ENCOUNTER — Telehealth: Payer: Self-pay | Admitting: Internal Medicine

## 2021-04-25 LAB — CBC
HCT: 29.7 % — ABNORMAL LOW (ref 36.0–46.0)
Hemoglobin: 9.8 g/dL — ABNORMAL LOW (ref 12.0–15.0)
MCH: 31.5 pg (ref 26.0–34.0)
MCHC: 33 g/dL (ref 30.0–36.0)
MCV: 95.5 fL (ref 80.0–100.0)
Platelets: 200 10*3/uL (ref 150–400)
RBC: 3.11 MIL/uL — ABNORMAL LOW (ref 3.87–5.11)
RDW: 14.9 % (ref 11.5–15.5)
WBC: 7.8 10*3/uL (ref 4.0–10.5)
nRBC: 0 % (ref 0.0–0.2)

## 2021-04-25 LAB — PHOSPHORUS: Phosphorus: 3.7 mg/dL (ref 2.5–4.6)

## 2021-04-25 LAB — MAGNESIUM: Magnesium: 2.2 mg/dL (ref 1.7–2.4)

## 2021-04-25 MED ORDER — PANTOPRAZOLE SODIUM 40 MG IV SOLR
40.0000 mg | Freq: Every day | INTRAVENOUS | Status: DC
  Administered 2021-04-25: 40 mg via INTRAVENOUS
  Filled 2021-04-25: qty 40

## 2021-04-25 MED ORDER — SODIUM CHLORIDE 1 G PO TABS
1.0000 g | ORAL_TABLET | Freq: Three times a day (TID) | ORAL | Status: DC
  Administered 2021-04-25 – 2021-04-26 (×4): 1 g via ORAL
  Filled 2021-04-25 (×4): qty 1

## 2021-04-25 NOTE — Telephone Encounter (Signed)
Recurrent hypoxemia with fever and cough following steroid taper after recent hospitalization concern for ILD.  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  Bronchoscopy was not performed given her tenuous respiratory status prior to steroid therapy.  Please arrange follow-up with Dr. Lamonte Sakai if able in the next 2 to 4 weeks for hospital follow-up.

## 2021-04-25 NOTE — Telephone Encounter (Signed)
Patient is asking that the nurse give her a call after being in hospital. Patient do have appt with Gilford Rile on 1/31. Please advise

## 2021-04-25 NOTE — Progress Notes (Signed)
NAME:  Sheila Morales, MRN:  [id number], DOB:  1950/09/23, LOS: 1 ADMISSION DATE:  04/23/2021, CONSULTATION DATE:  04/25/21 REFERRING MD:  TRH, CHIEF COMPLAINT:  SOB, cough, fever   History of Present Illness:  71 y.o. woman whom we are seeing in consultation for evaluation of recurrent hypoxemic respiratory failure and abnormal CT scan.  H&P reviewed.  Discharge summary from last admission 03/2021 reviewed.  Most recent pulmonary consult note 03/2021 reviewed.  Patient was admitted 03/21/2021 with complaints of fatigue weakness and altered mental status.  She was admitted with heart failure exacerbation and AKI as well as acute on chronic hyponatremia.  She was also found to have E. coli bacteremia.  These were treated.  Course companied by A. fib with RVR and worsening mentation so transferred to the ICU.  A. fib was adequately treated with beta-blocker, calcium channel blocker, addition of low-dose amiodarone.  CT scan 03/26/2021 was personally reviewed interpreted as bilateral fluffy upper lobe or upper lung zone predominant groundglass opacities relative sparing of the periphery with bilateral right greater than left pleural effusions most concerning for volume overload with possible superimposed pneumonia.  She was treated with antibiotics and steroids.  Notably, she had a CT scan 03/21/21 with small area of fibrosis right upper lobe in the posterior segment.  She was discharged from the hospital on prednisone taper and improved and was no longer needing oxygen.  They state her oxygen saturations were in the high 90s.  She completed a prednisone taper and a couple days later started developing cough, shortness of breath.  This led to progressive hypoxemia.  This led to presentation to the emergency room.  She was found to hypoxemic placed on 4 to 5 L nasal cannula.  CT chest abdomen pelvis obtained on my interpretation CT chest demonstrates marked improvement in bilateral infiltrates from 03/26/2021 with  marked progression and fibrotic appearing area the right upper lobe compared to 03/21/2021 with scattered groundglass opacities as well as evidence of volume loss with what appears to be traction bronchiectasis with mild interlobular septal thickening in the bilateral lower lobes worse on the right.   She has no leukocytosis.  She is febrile.  Her procalcitonin is negative.  She was started on antibiotics today.   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 Fever gone. Cough improved. DOE improved.   Objective   Blood pressure (!) 108/58, pulse 74, temperature 98.4 F (36.9 C), temperature source Oral, resp. rate 16, height 5\' 2"  (1.575 m), weight 61.5 kg, SpO2 93 %.        Intake/Output Summary (Last 24 hours) at 04/25/2021 1610 Last data filed at 04/25/2021 0800 Gross per 24 hour  Intake 1086.51 ml  Output 1801 ml  Net -714.49 ml    Filed Weights   04/23/21 1010 04/23/21 2219  Weight: 56.2 kg 61.5 kg    Examination: General: sitting up in bed Eyes: EOMI, no icterus Neck: Supple  no JVP appreciated Pulmonary: NWOB, on RA Cardiovascular: Regular rate and rhythm, no murmurs MSK: No synovitis, joint effusion Neuro: No focal weakness, sensation intact Psych: Normal mood, full affect,   Resolved Hospital Problem list   N/a  Assessment & Plan:  Acute  hypoxemic respite failure with progressive chronic pulmonary infiltrates with worsened fever and cough: Concerning for underlying inflammatory lung disease, favor NSIP given pattern and groundglass opacities on imaging on admission.  Suspect prior infiltrates 03/2021 reflective of this process as well as volume overload given bilateral pleural effusions now improved.  Small area of what looks like fibrosis in the right upper lobe in 03/21/2021.  Overall pattern improved from last admission but with progressive fibrosis particular in the right upper lobe as well as bronchiectasis and bilateral but right greater than left lower lobes.  No clear signs of pulmonary infection on CT scan on my interpretation.  Fear this is early fibrosis and signs of volume loss.  She has fever, productive cough worsening hypoxemia in the setting of steroid taper.  Procalcitonin is negative.  The differential also includes methotrexate lung toxicity although the pattern does not appear classic for this.  In addition, amiodarone toxicity is possible but given initial area of fibrosis predates amiodarone and the relative short duration of amiodarone administration this is felt unlikely.  She had peripheral eosinophilia of 400 on admission.  Marked improvement and essentially resolution of fever, hypoxemia, dyspnea, cough after 1 dose of prednisone 40 mg daily.  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  Bronchoscopy was deferred given tenuous Rester status on 5 L nasal cannula 04/24/2021.  Now after dose of steroids and near complete resolution of symptoms, bronchoscopy yield is essentially 0 as any eosinophils are likely to be obliterated after this dose of prednisone. --Continue antibiotics per primary --prednisone 40 mg daily x 14 days, 30 mg x 14 days, 20 mg x 14 days, 15 mg x 14 days, 10 mg x 14 days, 5 mg x 14 days --Consider  bronchoscopy in future if mild symptoms returned an effort to get cell count to  evaluate for eosinophilia --Recommend diuresing as kidney function allows, mild hyperchloremia and does not appear volume overloaded so no urgency in my mind --Sed rate, CRP, elevated, anti double-stranded DNA, complements pending to evaluate for possible lupus related ILD --Bactrim MWF for PJP prophylaxis given steroids added to already immunosuppression regimen of methotrexate and Plaquenil, need to continue  --Will arrange pulmonary clinic follow up  We will sign off  Best Practice (right click and "Reselect all SmartList Selections" daily)   Per primary  Labs   CBC: Recent Labs  Lab 04/23/21 1041 04/23/21 2347 04/24/21 0507 04/24/21 1205 04/25/21 1246  WBC 8.5 6.8 6.9 8.2 7.8  NEUTROABS 6.8  --  5.1  --   --   HGB 7.3* 10.1* 9.6* 9.8* 9.8*  HCT 21.9* 30.5* 28.6* 29.9* 29.7*  MCV 93.6 97.1 95.0 95.8 95.5  PLT 237 181 180 183 200     Basic Metabolic Panel: Recent Labs  Lab 04/23/21 1041 04/23/21 2347 04/24/21 0507 04/24/21 0906 04/24/21 1205 04/24/21 1650 04/25/21 0357  NA 124* 126* 129*  --  128* 128*  --   K 5.7* 4.7 5.3* 4.8 5.0 4.1  --   CL 89* 95* 93*  --  95* 97*  --   CO2 28 24 28   --  26 24  --   GLUCOSE 84 169* 107*  --  98 80  --  BUN 25* 22 24*  --  22 23  --   CREATININE 1.36* 1.30* 1.27*  --  1.16* 1.14*  --   CALCIUM 9.0 8.0* 8.2*  --  8.2* 7.8*  --   MG 2.3  --  2.1  --   --   --  2.2  PHOS  --   --   --   --   --   --  3.7    GFR: Estimated Creatinine Clearance: 39.1 mL/min (A) (by C-G formula based on SCr of 1.14 mg/dL (H)). Recent Labs  Lab 04/23/21 2347 04/24/21 0132 04/24/21 0507 04/24/21 1205 04/25/21 1246  PROCALCITON  --  <0.10  --   --   --   WBC 6.8  --  6.9 8.2 7.8     Liver Function Tests: Recent Labs  Lab 04/23/21 1041 04/24/21 0507  AST 30 20  ALT 22 21  ALKPHOS 60 55  BILITOT 0.7 0.5  PROT 7.5 6.3*  ALBUMIN 3.7 3.0*    No results for input(s): LIPASE, AMYLASE in the last 168 hours. Recent Labs  Lab  04/23/21 1141  AMMONIA 26     ABG    Component Value Date/Time   PHART 7.382 03/24/2021 0900   PCO2ART 37.1 03/24/2021 0900   PO2ART 73.5 (L) 03/24/2021 0900   HCO3 21.6 03/24/2021 0900   TCO2 23 03/21/2021 2118   ACIDBASEDEF 2.7 (H) 03/24/2021 0900   O2SAT 94.4 03/24/2021 0900      Coagulation Profile: Recent Labs  Lab 04/23/21 1141  INR 1.5*     Cardiac Enzymes: No results for input(s): CKTOTAL, CKMB, CKMBINDEX, TROPONINI in the last 168 hours.  HbA1C: Hgb A1c MFr Bld  Date/Time Value Ref Range Status  02/24/2017 01:51 PM 5.7 4.6 - 6.5 % Final    Comment:    Glycemic Control Guidelines for People with Diabetes:Non Diabetic:  <6%Goal of Therapy: <7%Additional Action Suggested:  >8%     CBG: No results for input(s): GLUCAP in the last 168 hours.  Review of Systems:   No chest pain exertion, no orthopnea or PND.  Comprehensive review of systems otherwise negative.  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   Surgical History:   Past Surgical History:  Procedure Laterality Date   CHOLECYSTECTOMY     CHOLECYSTECTOMY, LAPAROSCOPIC     COLONOSCOPY     ESOPHAGOGASTRODUODENOSCOPY     lesion, vulva excision     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   Procedure: PACEMAKER IMPLANT;  Surgeon: [name], MD;  Location: [facility];  Service: Cardiovascular;  Laterality: N/A;   TONSILLECTOMY     VIDEO BRONCHOSCOPY Bilateral 11/06/2017   Procedure: VIDEO BRONCHOSCOPY WITHOUT FLUORO;  Surgeon: Marshell Garfinkel, MD;  Location: [facility];  Service:  Cardiopulmonary;  Laterality: Bilateral;     Social History:   reports that she has been smoking cigarettes. She has a 7.50 pack-year smoking history. She has never used smokeless tobacco. She reports current alcohol use of about 5.0 standard drinks per week. She reports that she does not use drugs.   Family History:  Her family history includes Atrial fibrillation in her father; Colon cancer  in her father; Healthy in her daughter; Other in her sister; Stroke in her mother.   Allergies No Known Allergies   Home Medications  Prior to Admission medications   Medication Sig Start Date End Date Taking? Authorizing Provider  albuterol (VENTOLIN HFA) 108 (90 Base) MCG/ACT inhaler Inhale 1-2 puffs into the lungs every 6 (six) hours as needed for wheezing or shortness of breath. 10/13/20  Yes Byrum, Rose Fillers, MD  amiodarone (PACERONE) 200 MG tablet Take 1 tablet (200 mg total) by mouth 2 (two) times daily. 04/01/21 05/01/21 Yes Pahwani, Einar Grad, MD  ANORO ELLIPTA 62.5-25 MCG/ACT AEPB TAKE 1 PUFF BY MOUTH EVERY DAY 04/19/21  Yes Byrum, Rose Fillers, MD  apixaban (ELIQUIS) 5 MG TABS tablet Take 1 tablet (5 mg total) by mouth 2 (two) times daily. 04/01/21 05/01/21 Yes Pahwani, Einar Grad, MD  benzonatate (TESSALON) 200 MG capsule Take 1 capsule (200 mg total) by mouth every 6 (six) hours as needed for cough. 03/20/21  Yes Collene Gobble, MD  calcium carbonate (OS-CAL - DOSED IN MG OF ELEMENTAL CALCIUM) 1250 (500 Ca) MG tablet Take 1 tablet by mouth every evening.   Yes [provider]  Cholecalciferol (VITAMIN D) 50 MCG (2000 UT) tablet Take 2,000 Units by mouth every evening.   Yes [provider]  diltiazem (CARDIZEM CD) 360 MG 24 hr capsule Take 1 capsule (360 mg total) by mouth daily. 04/02/21 05/02/21 Yes Pahwani, Einar Grad, MD  folic acid (FOLVITE) 1 MG tablet Take 1 mg by mouth every evening.   Yes [provider]  furosemide (LASIX) 20 MG tablet Take 1 tablet (20 mg total) by mouth daily.  04/02/21 05/02/21 Yes Pahwani, Einar Grad, MD  gabapentin (NEURONTIN) 300 MG capsule TAKE TWO CAPSULES BY MOUTH EVERY MORNING, TWO CAPSULES EACH AFTERNOON AND THREE CAPSULES AT BEDTIME Patient taking differently: Take 600-900 mg by mouth See admin instructions. 02/27/21  Yes Hoyt Koch, MD  hydroxychloroquine (PLAQUENIL) 200 MG tablet Take 200 mg by mouth daily.   Yes [provider]  methotrexate 2.5 MG tablet Take 10 mg by mouth every Thursday.   Yes [provider]  metoprolol tartrate (LOPRESSOR) 25 MG tablet Take 1 tablet (25 mg total) by mouth 2 (two) times daily. 04/01/21 05/01/21 Yes Pahwani, Einar Grad, MD  Misc Natural Products (ADVANCED JOINT RELIEF) CAPS Take 1 capsule by mouth daily. Joint Advantage Gold 5x   Yes [provider]  Multiple Vitamin (MULTIVITAMIN WITH MINERALS) TABS tablet Take 1 tablet by mouth daily.   Yes [provider]  RESTASIS 0.05 % ophthalmic emulsion Place 1 drop into both eyes 2 (two) times daily. 07/30/17  Yes [provider]  vitamin C (ASCORBIC ACID) 500 MG tablet Take 500 mg by mouth daily.   Yes [provider]  vitamin E 180 MG (400 UNITS) capsule Take 400 Units by mouth daily.   Yes [provider]  zolpidem (AMBIEN) 5 MG tablet Take 1 tablet (5 mg total) by mouth at bedtime as needed for sleep. 10/30/20  Yes Hoyt Koch, MD  nystatin (MYCOSTATIN) 100000 UNIT/ML suspension Take 5 mLs (500,000 Units total) by mouth in the morning, at noon, and at bedtime. Patient not taking: Reported on 04/23/2021 04/10/21   Hoyt Koch, MD  predniSONE (DELTASONE) 10 MG tablet Take 30 mg (3 tabs) po daily for 5 days, followed by 20 mg (2 tabs) po daily for 5 days, followed by 10 mg po daily for 5 days. Patient not taking: Reported on  04/23/2021 04/01/21   Darliss Cheney, MD     Critical care time: n/a   Lanier Clam, MD See Shea Evans for contact info

## 2021-04-25 NOTE — Telephone Encounter (Signed)
Spoke with patient who wanted me to speak with her husband [name]. Patient has been in the hospital and is to be discharged tomorrow. Husband is concerned because patient had "blood in her stool" and wanted to know if she should be on Eliquis and prednisone. I explained that while the patient is still in the hospital, it is best to discuss the treatment regimen with the nurse and physicians who are providing care for the patient so he and the patient can have their questions answered before discharge. He voiced understanding and thanked me.

## 2021-04-25 NOTE — Evaluation (Signed)
Occupational Therapy Evaluation and Discharge Patient Details Name: Sheila Morales MRN: [id number] DOB: July 25, 1950 Today's Date: 04/25/2021   History of Present Illness Pt is a 71 year old woman admitted on 04/23/21 with AMS, cough, shortness of breath and hypoxemia.Head CT negative for acute changes, hgb 7.3, CXR + multifocal PNA. PMH: COPD, interstitial lung disease, dCHF with EF of 70-75%, lupus, ischemic colitis, afib, SIADH with chronic hyponatremia, anemia, SSS with PCM.   Clinical Impression   Pt was intermittently using a RW vs furniture walking at home. She was receiving HHPT. Since hospital admission in November, pt had returned to independence in ADL, standing to shower. 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 Pt is overall functioning at  set up to min guard assist level in ADL and ADL transfers. She demonstrates poor activity tolerance. Educated at [employer] in energy conservation and provided written handout to reinforce. Pt plans to return home with her supportive husband. No further OT needs.      Recommendations for follow up therapy are one component of a multi-disciplinary discharge planning process, led by the attending physician.  Recommendations may be updated based on patient status, additional functional criteria and insurance authorization.   Follow Up Recommendations  No OT follow up    Assistance Recommended at Discharge None  Patient can return home with the following Assistance with cooking/housework;Help with stairs or ramp for entrance    Functional Status Assessment  Patient has had a recent decline in their functional status and demonstrates the ability to make significant improvements in function in a reasonable and predictable amount of time.  Equipment Recommendations  None recommended by OT    Recommendations for Other Services       Precautions / Restrictions Precautions Precautions: Fall      Mobility Bed Mobility Overal  bed mobility: Modified Independent             General bed mobility comments: HOB up    Transfers Overall transfer level: Needs assistance Equipment used: Rolling walker (2 wheels);None Transfers: Sit to/from Stand Sit to Stand: Supervision                  Balance Overall balance assessment: Needs assistance   Sitting balance-Leahy Scale: Normal       Standing balance-Leahy Scale: Fair Standing balance comment: unsteady without walker, but no overt LOB                           ADL either performed or assessed with clinical judgement   ADL                                         General ADL Comments: Overall functioning at a supervision to min guard assist level.     Vision Ability to See in Adequate Light: 0 Adequate Patient Visual Report: No change from baseline       Perception     Praxis      Pertinent Vitals/Pain Pain Assessment: No/denies pain     Hand Dominance Right   Extremity/Trunk Assessment Upper Extremity Assessment Upper Extremity Assessment: Overall WFL for tasks assessed   Lower Extremity Assessment Lower Extremity Assessment: Defer to PT evaluation   Cervical / Trunk Assessment Cervical / Trunk Assessment: Normal   Communication Communication Communication: HOH   Cognition Arousal/Alertness: Awake/alert  Behavior During Therapy: WFL for tasks assessed/performed Overall Cognitive Status: Within Functional Limits for tasks assessed                                       General Comments       Exercises     Shoulder Instructions      Home Living Family/patient expects to be discharged to:: Private residence Living Arrangements: Spouse/significant other Available Help at Discharge: Family;Available 24 hours/day Type of Home: House Home Access: Stairs to enter [employer] of Steps: 2 Entrance Stairs-Rails: None Home Layout: One level     Bathroom Shower/Tub:  Occupational psychologist: Handicapped height     Home Equipment: International aid/development worker (2 wheels);Cane - single point;Hand held shower head;Grab bars - toilet   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, hasn't resumed driving      Prior Functioning/Environment Prior Level of Function : Needs assist             Mobility Comments: intermittently uses RW vs furniture walking ADLs Comments: had returned to showering in standing        OT Problem List: Decreased activity tolerance      OT Treatment/Interventions:      OT Goals(Current goals can be found in the care plan section)    OT Frequency:      Co-evaluation              AM-PAC OT "6 Clicks" Daily Activity     Outcome Measure Help from another person eating meals?: None Help from another person taking care of personal grooming?: None Help from another person toileting, which includes using toliet, bedpan, or urinal?: None Help from another person bathing (including washing, rinsing, drying)?: None Help from another person to put on and taking off regular upper body clothing?: None Help from another person to put on and taking off regular lower body clothing?: None 6 Click Score: 24   End of Session Equipment Utilized During Treatment: Rolling walker (2 wheels);Gait belt Nurse Communication: Mobility status  Activity Tolerance: Patient tolerated treatment well Patient left: in bed;with call bell/phone within reach;with family/visitor present  OT Visit Diagnosis: Other (comment) (decreased activity tolerance)                Time: 2585-2778 OT Time Calculation (min): 33 min Charges:  OT General Charges $OT Visit: 1 Visit OT Evaluation $OT Eval Low Complexity: 1 Low OT Treatments $Self Care/Home Management : 8-22 mins  Nestor Lewandowsky, OTR/L Acute Rehabilitation Services Pager: [phone] Office: [phone]  Sheila Morales 04/25/2021, 1:48 PM

## 2021-04-25 NOTE — Progress Notes (Signed)
PROGRESS NOTE    Sheila Morales  EGB:[id number] DOB: March 13, 1951 DOA: 04/23/2021 PCP: Hoyt Koch, MD   Brief Narrative:  This 71 years old female with PMH significant for COPD, diastolic congestive heart failure with recent echo showed EF 70 to 75% with grade 2 diastolic dysfunction, lupus, ischemic colitis, atrial fibrillation, SIADH with chronic hyponatremia, anemia of chronic disease, sick sinus syndrome status post pacemaker placement presented in ED with complaints of confusion over last 2 days.  Of note Patient was recently hospitalized at [facility] from 11/ 30-12/11.  Hospital course was remarkable for E. coli bacteremia secondary to UTI and right-sided pyelonephritis.  Patient has also developed new onset atrial fibrillation and acute congestive heart failure and was managed in the ICU and temporary requiring BiPAP.  She was also initiated on a daily prednisone therapy for interstitial lung disease by pulmonology.  Hyponatremia was felt to be secondary to SIADH managed with fluid restrictions and salt tablets.  Patient has been confused and having intermittent fever for last few days at home.  Work-up in the ED showed UA unremarkable , Chest x-ray revealed interstitial infiltrate bilaterally.  BNP 533, CBC shows hemoglobin 7.3 down from 10.4  3 weeks ago.  Patient is started on ceftriaxone and Zithromax for possible multifocal pneumonia.  She was also given Lokelma for hyperkalemia which has improved. Patient is admitted for acute metabolic encephalopathy, work-up is in process.  Assessment & Plan:   Principal Problem:   Acute metabolic encephalopathy Active Problems:   Systemic lupus erythematosus (HCC)   COPD (chronic obstructive pulmonary disease) (HCC)   Hyponatremia   Chronic diastolic CHF (congestive heart failure) (HCC)   AF (paroxysmal atrial fibrillation) (HCC)   Hyperkalemia   Acute blood loss anemia   Fever   Interstitial lung disease (HCC)  Acute metabolic  encephalopathy: > Resolved. Patient presented with 3-day history of progressively worsening lethargy and intermittent confusion. Likely multifactorial secondary to progressive infectious process. Patient has chronic hyponatremia secondary to SIADH. Mild hyponatremia is also contributing but unlikely the sole cause. CT head: No acute intracranial abnormality noted. UA unremarkable, chest x-ray showed residual infection. Continue empiric antibiotics ceftriaxone and Zithromax. Procalcitonin normal.  Mental status has significantly improved. Blood cultures no growth to date, no leukocytosis, remained afebrile in last 24 hours.   Acute blood loss anemia: Patient presented with significant drop in Hb 7.3 from 10.4 Patient and family denies any melena or hematemesis. Stool for occult blood positive in ED. Repeat hemoglobin 10.3 appears normal. Slow gastrointestinal bleeding could be secondary to Eliquis use. Continue Protonix 40 mg every 12 hours. Hemoglobin remained stable around 9.5. May consider GI for consideration of potential endoscopic evaluation if hemoglobin drops further.  Fever : Patient presented with history of recurrent fevers. There is no other SIRS criteria met. Patient was recently treated for E. coli bacteremia. Chest x-ray reveals interstitial infiltrate however this is unchanged from prior. CT chest showed findings consistent with multifocal pneumonia. Continue empiric antibiotics ceftriaxone and Zithromax. Procalcitonin normal, blood cultures no growth so far.  Hyponatremia: Patient does have chronic hyponatremia due to SIADH. Patient has been not adhering to any strict fluid restrictions since discharge. Continue fluid restrictions, serum sodium is improving. Sodium 124> 129>130 >128  Hyperkalemia: Mild hyperkalemia with no EKG changes noted. Patient has received Lokelma x 1. Serum potassium improved.  Paroxysmal atrial fibrillation It was diagnosed during  recent hospitalization. Currently patient is  in normal sinus rhythm and rate is controlled. Continue amiodarone, diltiazem,  metoprolol and Eliquis.  Insterstitial lung disease: She was diagnosed with interstitial lung disease by pulmonology on last admission. Patient was discharged on prednisone taper which she has completed. Continue as needed bronchodilators.   Pulmonology consulted recommended to continue current antibiotics, Started patient on prednisone taper and Bactrim for PJP prophylaxis given immunocompromised state.  Chronic diastolic CHF: BNP slightly elevated, this is dramatically lower than prior hospitalization BNP. There is no clinical evidence of volume overloaded noted.  SLE: Continue home regimen of methotrexate and hydroxychlorquine  COPD: No evidence of exacerbation at this time.   Continue home regimen of maintenance inhalers.   DVT prophylaxis: SCDs Code Status: Full code. Family Communication: Husband at bed side. Disposition Plan:   Status is: Inpatient  Remains inpatient appropriate because:  Admitted for acute encephalopathy associated with fever could be multifactorial. Acute encephalopathy resolved.  Awaiting blood cultures.  Consultants:  None  Procedures: CTA chest abdomen and pelvis Antimicrobials:   Anti-infectives (From admission, onward)    Start     Dose/Rate Route Frequency Ordered Stop   04/25/21 0900  sulfamethoxazole-trimethoprim (BACTRIM) 400-80 MG per tablet 1 tablet        1 tablet Oral Once per day on Mon Wed Fri 04/24/21 1550     04/24/21 1400  cefTRIAXone (ROCEPHIN) 1 g in sodium chloride 0.9 % 100 mL IVPB        1 g 200 mL/hr over 30 Minutes Intravenous Every 24 hours 04/24/21 0232     04/24/21 1400  azithromycin (ZITHROMAX) 500 mg in sodium chloride 0.9 % 250 mL IVPB        500 mg 250 mL/hr over 60 Minutes Intravenous Every 24 hours 04/24/21 0232     04/24/21 1000  hydroxychloroquine (PLAQUENIL) tablet 200 mg         200 mg Oral Daily 04/24/21 0046     04/23/21 1400  azithromycin (ZITHROMAX) 500 mg in sodium chloride 0.9 % 250 mL IVPB        500 mg 250 mL/hr over 60 Minutes Intravenous  Once 04/23/21 1357 04/23/21 1600   04/23/21 1345  cefTRIAXone (ROCEPHIN) 1 g in sodium chloride 0.9 % 100 mL IVPB        1 g 200 mL/hr over 30 Minutes Intravenous  Once 04/23/21 1332 04/23/21 1600       Subjective: Patient was seen and examined at bedside.  Overnight events noted.   Patient reports feeling much improved.  She denies any chest pain or shortness of breath. Hemoglobin remained stable.  She denies any bleeding in the stools.  Objective: Vitals:   04/25/21 0859 04/25/21 0940 04/25/21 0941 04/25/21 1307  BP:  (!) 97/55  (!) 108/58  Pulse:   73 74  Resp:    16  Temp:    98.4 F (36.9 C)  TempSrc:    Oral  SpO2: 92%   93%  Weight:      Height:        Intake/Output Summary (Last 24 hours) at 04/25/2021 1317 Last data filed at 04/25/2021 0800 Gross per 24 hour  Intake 1086.51 ml  Output 1801 ml  Net -714.49 ml   Filed Weights   04/23/21 1010 04/23/21 2219  Weight: 56.2 kg 61.5 kg    Examination:  General exam: Appears deconditioned, chronically ill looking, not in any acute distress, comfortable Respiratory system: Clear to auscultation bilaterally, respiratory effort normal, RR 13. Cardiovascular system: S1-S2 heard, regular rate and rhythm, no murmur. Gastrointestinal system: Abdomen is soft,  nontender, nondistended, BS+ Central nervous system: Alert and oriented x 3. No focal neurological deficits. Extremities: No edema, no cyanosis, no clubbing. Skin: No rashes, lesions or ulcers Psychiatry: Judgement and insight appear normal. Mood & affect appropriate.     Data Reviewed: I have personally reviewed following labs and imaging studies  CBC: Recent Labs  Lab 04/23/21 1041 04/23/21 2347 04/24/21 0507 04/24/21 1205 04/25/21 1246  WBC 8.5 6.8 6.9 8.2 7.8  NEUTROABS 6.8  --  5.1   --   --   HGB 7.3* 10.1* 9.6* 9.8* 9.8*  HCT 21.9* 30.5* 28.6* 29.9* 29.7*  MCV 93.6 97.1 95.0 95.8 95.5  PLT 237 181 180 183 762   Basic Metabolic Panel: Recent Labs  Lab 04/23/21 1041 04/23/21 2347 04/24/21 0507 04/24/21 0906 04/24/21 1205 04/24/21 1650 04/25/21 0357  NA 124* 126* 129*  --  128* 128*  --   K 5.7* 4.7 5.3* 4.8 5.0 4.1  --   CL 89* 95* 93*  --  95* 97*  --   CO2 28 24 28   --  26 24  --   GLUCOSE 84 169* 107*  --  98 80  --   BUN 25* 22 24*  --  22 23  --   CREATININE 1.36* 1.30* 1.27*  --  1.16* 1.14*  --   CALCIUM 9.0 8.0* 8.2*  --  8.2* 7.8*  --   MG 2.3  --  2.1  --   --   --  2.2  PHOS  --   --   --   --   --   --  3.7   GFR: Estimated Creatinine Clearance: 39.1 mL/min (A) (by C-G formula based on SCr of 1.14 mg/dL (H)). Liver Function Tests: Recent Labs  Lab 04/23/21 1041 04/24/21 0507  AST 30 20  ALT 22 21  ALKPHOS 60 55  BILITOT 0.7 0.5  PROT 7.5 6.3*  ALBUMIN 3.7 3.0*   No results for input(s): LIPASE, AMYLASE in the last 168 hours. Recent Labs  Lab 04/23/21 1141  AMMONIA 26   Coagulation Profile: Recent Labs  Lab 04/23/21 1141  INR 1.5*   Cardiac Enzymes: No results for input(s): CKTOTAL, CKMB, CKMBINDEX, TROPONINI in the last 168 hours. BNP (last 3 results) Recent Labs    03/21/21 1206  PROBNP 1,279.0*   HbA1C: No results for input(s): HGBA1C in the last 72 hours. CBG: No results for input(s): GLUCAP in the last 168 hours. Lipid Profile: No results for input(s): CHOL, HDL, LDLCALC, TRIG, CHOLHDL, LDLDIRECT in the last 72 hours. Thyroid Function Tests: Recent Labs    04/23/21 1111  TSH 1.577   Anemia Panel: No results for input(s): VITAMINB12, FOLATE, FERRITIN, TIBC, IRON, RETICCTPCT in the last 72 hours. Sepsis Labs: Recent Labs  Lab 04/24/21 0132  PROCALCITON <0.10    Recent Results (from the past 240 hour(s))  Resp Panel by RT-PCR (Flu A&B, Covid) Nasopharyngeal Swab     Status: None   Collection Time:  04/23/21 10:41 AM   Specimen: Nasopharyngeal Swab; Nasopharyngeal(NP) swabs in vial transport medium  Result Value Ref Range Status   SARS Coronavirus 2 by RT PCR NEGATIVE NEGATIVE Final    Comment: (NOTE) SARS-CoV-2 target nucleic acids are NOT DETECTED.  The SARS-CoV-2 RNA is generally detectable in upper respiratory specimens during the acute phase of infection. The lowest concentration of SARS-CoV-2 viral copies this assay can detect is 138 copies/mL. A negative result does not preclude SARS-Cov-2 infection and should  not be used as the sole basis for treatment or other patient management decisions. A negative result may occur with  improper specimen collection/handling, submission of specimen other than nasopharyngeal swab, presence of viral mutation(s) within the areas targeted by this assay, and inadequate number of viral copies(<138 copies/mL). A negative result must be combined with clinical observations, patient history, and epidemiological information. The expected result is Negative.  Fact Sheet for Patients:  EntrepreneurPulse.com.au  Fact Sheet for Healthcare Providers:  IncredibleEmployment.be  This test is no t yet approved or cleared by the [place] FDA and  has been authorized for detection and/or diagnosis of SARS-CoV-2 by FDA under an Emergency Use Authorization (EUA). This EUA will remain  in effect (meaning this test can be used) for the duration of the COVID-19 declaration under Section 564(b)(1) of the Act, 21 U.S.C.section 360bbb-3(b)(1), unless the authorization is terminated  or revoked sooner.       Influenza A by PCR NEGATIVE NEGATIVE Final   Influenza B by PCR NEGATIVE NEGATIVE Final    Comment: (NOTE) The Xpert Xpress SARS-CoV-2/FLU/RSV plus assay is intended as an aid in the diagnosis of influenza from Nasopharyngeal swab specimens and should not be used as a sole basis for treatment. Nasal washings  and aspirates are unacceptable for Xpert Xpress SARS-CoV-2/FLU/RSV testing.  Fact Sheet for Patients: EntrepreneurPulse.com.au  Fact Sheet for Healthcare Providers: IncredibleEmployment.be  This test is not yet approved or cleared by the [place] FDA and has been authorized for detection and/or diagnosis of SARS-CoV-2 by FDA under an Emergency Use Authorization (EUA). This EUA will remain in effect (meaning this test can be used) for the duration of the COVID-19 declaration under Section 564(b)(1) of the Act, 21 U.S.C. section 360bbb-3(b)(1), unless the authorization is terminated or revoked.  Performed at [facility], [address], [place], [place] [address]   Culture, blood (routine x 2)     Status: None (Preliminary result)   Collection Time: 04/24/21  1:32 AM   Specimen: BLOOD  Result Value Ref Range Status   Specimen Description   Final    BLOOD RIGHT ANTECUBITAL Performed at [facility] [address] [place], [place] [address]    Special Requests   Final    BOTTLES DRAWN AEROBIC AND ANAEROBIC Blood Culture adequate volume Performed at [facility] [address], [place], [place] [address]    Culture   Final    NO GROWTH 1 DAY Performed at [facility], [place] [address] [place], [place] [address]    Report Status PENDING  Incomplete  Culture, blood (routine x 2)     Status: None (Preliminary result)   Collection Time: 04/24/21  1:32 AM   Specimen: BLOOD  Result Value Ref Range Status   Specimen Description   Final    BLOOD BLOOD RIGHT HAND Performed at [facility] [address], [place], [place] [address]    Special Requests   Final    BOTTLES DRAWN AEROBIC ONLY Blood Culture adequate volume Performed at [facility] [address], [place], [place] [address]    Culture   Final    NO GROWTH 1 DAY Performed at  [facility], [place] [address], [place], [place] [address]    Report Status PENDING  Incomplete    Radiology Studies: CT CHEST ABDOMEN PELVIS W CONTRAST  Result Date: 04/24/2021 CLINICAL DATA:  71 year old female with sepsis, recurrent fever, immunocompromised. EXAM: CT  CHEST, ABDOMEN, AND PELVIS WITH CONTRAST TECHNIQUE: Multidetector CT imaging of the chest, abdomen and pelvis was performed following the standard protocol during bolus administration of intravenous contrast. CONTRAST:  42m OMNIPAQUE IOHEXOL 350 MG/ML SOLN COMPARISON:  Portable chest 04/23/2021. CT Chest, Abdomen, and Pelvis 03/26/2021. FINDINGS: CT CHEST FINDINGS Cardiovascular: Left chest transvenous pacemaker device. Calcified coronary artery atherosclerosis. Calcified aortic atherosclerosis. Left atrial enlargement suspected but overall cardiac size within normal limits. No pericardial effusion. Major mediastinal vascular structures appear to be patent. Mediastinum/Nodes: Reactive appearing mediastinal and hilar lymph nodes, individually up to 13 mm short axis anterior to the carina. Lungs/Pleura: Bilateral pleural effusions have regressed, trace residual pleural fluid now. Major airways are patent and lung volumes are larger. Regressed but not resolved bilateral upper lobe pulmonary opacity, ground-glass type and confluent now greater on the right. Superimposed emphysema and lung scarring including some areas of early subpleural honeycombing. Improved bibasilar ventilation with patchy residual lower lobe peribronchial opacity. Musculoskeletal: Chronic left anterior rib fractures. No acute or suspicious osseous abnormality identified. CT ABDOMEN PELVIS FINDINGS Hepatobiliary: Absent gallbladder. Liver and bile ducts within normal limits. Pancreas: Negative. Spleen: Negative. Adrenals/Urinary Tract: Normal adrenal glands. Nonobstructed kidneys with symmetric renal enhancement and contrast excretion although there is on going asymmetric  right pararenal space inflammation and/or fluid with simple fluid density (series 5, image 58). But no evidence of acute pyelonephritis. And ureters appear to remain normal. Larger but unremarkable bladder. Stomach/Bowel: Retained stool and diverticulosis of the large bowel in the descending and sigmoid segments. No active inflammation. Redundant transverse colon with similar retained stool. Negative right colon with some residual oral contrast suspected. Fluid in the terminal ileum which is within normal limits. Normal appendix visible on coronal image 57. No dilated small bowel. Decompressed stomach and duodenum. No free air, free fluid, or mesenteric inflammation. Vascular/Lymphatic: Extensive Aortoiliac calcified atherosclerosis. Major arterial structures remain patent. Portal venous system appears to be patent. No lymphadenopathy identified. Reproductive: Negative. Other: No pelvic free fluid. Musculoskeletal: Lumbar disc and endplate degeneration associated with mild levoconvex scoliosis. No acute or suspicious osseous process identified. IMPRESSION: 1. Regressed but not resolved confluent bilateral upper lobe predominant lung opacity, mostly ground-glass. Favor regressed multifocal pneumonia. Underlying chronic lung disease including Emphysema (ICD10-J43.9). Decreased pleural effusions, trace residual. Reactive appearing mediastinal and hilar lymph nodes. 2. Persistent asymmetric right pararenal space fluid and/or inflammation but no CT evidence of pyelonephritis or obstructive uropathy. 3. No other acute or inflammatory process identified in the chest, abdomen, or pelvis. Aortic Atherosclerosis (ICD10-I70.0). Electronically Signed   By: HGenevie AnnM.D.   On: 04/24/2021 09:35    Scheduled Meds:  amiodarone  200 mg Oral BID   apixaban  5 mg Oral BID   cycloSPORINE  1 drop Both Eyes BID   diltiazem  360 mg Oral Daily   folic acid  1 mg Oral QPM   gabapentin  300 mg Oral TID   hydroxychloroquine  200 mg  Oral Daily   metoprolol tartrate  25 mg Oral BID   pantoprazole  40 mg Intravenous QHS   predniSONE  40 mg Oral Q breakfast   sodium chloride  1 g Oral TID WC   sulfamethoxazole-trimethoprim  1 tablet Oral Once per day on Mon Wed Fri   umeclidinium-vilanterol  1 puff Inhalation Daily   Continuous Infusions:  azithromycin 500 mg (04/24/21 1319)   cefTRIAXone (ROCEPHIN)  IV 1 g (04/25/21 1245)     LOS: 1 day    Time spent: 35  mins    Shawna Clamp, MD [facility]   If 7PM-7AM, please contact night-coverage

## 2021-04-25 NOTE — Evaluation (Signed)
Physical Therapy Evaluation Patient Details Name: Sheila Morales MRN: [id number] DOB: 23-Mar-1951 Today's Date: 04/25/2021  History of Present Illness  Pt is a 71 year old woman admitted on 04/23/21 with AMS, cough, shortness of breath and hypoxemia.Head CT negative for acute changes, hgb 7.3, CXR + multifocal PNA. PMH: COPD, interstitial lung disease, dCHF with EF of 70-75%, lupus, ischemic colitis, afib, SIADH with chronic hyponatremia, anemia, SSS with PCM.  Clinical Impression  On eval, pt was Min guard assist for mobility. She walked ~175 feet with a RW. Pt presents with general weakness, decreased activity tolerance, and impaired gait and balance. O2 93% on RA, dyspnea 2/4 with activity. Discussed d/c plan-pt plans to return home with HHPT. Will plan to follow and progress activity as tolerated.        Recommendations for follow up therapy are one component of a multi-disciplinary discharge planning process, led by the attending physician.  Recommendations may be updated based on patient status, additional functional criteria and insurance authorization.  Follow Up Recommendations Home health PT    Assistance Recommended at Discharge Frequent or constant Supervision/Assistance  Patient can return home with the following  A little help with walking and/or transfers    Equipment Recommendations None recommended by PT  Recommendations for Other Services       Functional Status Assessment Patient has had a recent decline in their functional status and demonstrates the ability to make significant improvements in function in a reasonable and predictable amount of time.     Precautions / Restrictions Precautions Precautions: Fall Restrictions Weight Bearing Restrictions: No      Mobility  Bed Mobility Overal bed mobility: Modified Independent             General bed mobility comments: HOB up    Transfers Overall transfer level: Needs assistance Equipment used: Rolling walker  (2 wheels);None Transfers: Sit to/from Stand Sit to Stand: Supervision           General transfer comment: Supv for safety.    Ambulation/Gait Ambulation/Gait assistance: Min guard Gait Distance (Feet): 175 Feet Assistive device: Rolling walker (2 wheels) Gait Pattern/deviations: Step-through pattern;Decreased stride length       General Gait Details: Min guard for safety. Good gait speed. O2 93% on RA, dyspnea 2/4  Stairs            Wheelchair Mobility    Modified Rankin (Stroke Patients Only)       Balance Overall balance assessment: Mild deficits observed, not formally tested   Sitting balance-Leahy Scale: Normal       Standing balance-Leahy Scale: Fair Standing balance comment: unsteady without walker, but no overt LOB                             Pertinent Vitals/Pain Pain Assessment: No/denies pain    Home Living Family/patient expects to be discharged to:: Private residence Living Arrangements: Spouse/significant other Available Help at Discharge: Family;Available 24 hours/day Type of Home: House Home Access: Stairs to enter Entrance Stairs-Rails: None Entrance Stairs-Number of Steps: 2   Home Layout: One level Home Equipment: International aid/development worker (2 wheels);Cane - single point;Hand held shower head;Grab bars - toilet 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, hasn't resumed driving    Prior Function Prior Level of Function : Needs assist             Mobility Comments: intermittently uses RW vs furniture walking ADLs Comments: had returned  to showering in standing     Hand Dominance   Dominant Hand: Right    Extremity/Trunk Assessment   Upper Extremity Assessment Upper Extremity Assessment: Defer to OT evaluation    Lower Extremity Assessment Lower Extremity Assessment: Generalized weakness    Cervical / Trunk Assessment Cervical / Trunk Assessment: Normal  Communication   Communication:  HOH  Cognition Arousal/Alertness: Awake/alert Behavior During Therapy: WFL for tasks assessed/performed Overall Cognitive Status: Within Functional Limits for tasks assessed                                          General Comments      Exercises     Assessment/Plan    PT Assessment Patient needs continued PT services  PT Problem List Decreased strength;Decreased mobility;Decreased activity tolerance;Decreased balance;Decreased knowledge of use of DME       PT Treatment Interventions DME instruction;Gait training;Therapeutic activities;Therapeutic exercise;Patient/family education;Balance training;Functional mobility training    PT Goals (Current goals can be found in the Care Plan section)  Acute Rehab PT Goals Patient Stated Goal: home soon. resume HHPT. PT Goal Formulation: With patient/family Time For Goal Achievement: 05/09/21 Potential to Achieve Goals: Good    Frequency Min 3X/week     Co-evaluation               AM-PAC PT "6 Clicks" Mobility  Outcome Measure Help needed turning from your back to your side while in a flat bed without using bedrails?: None Help needed moving from lying on your back to sitting on the side of a flat bed without using bedrails?: None Help needed moving to and from a bed to a chair (including a wheelchair)?: A Little Help needed standing up from a chair using your arms (e.g., wheelchair or bedside chair)?: A Little Help needed to walk in hospital room?: A Little Help needed climbing 3-5 steps with a railing? : A Little 6 Click Score: 20    End of Session Equipment Utilized During Treatment: Gait belt Activity Tolerance: Patient tolerated treatment well Patient left: in bed;with call bell/phone within reach;with bed alarm set;with family/visitor present   PT Visit Diagnosis: Muscle weakness (generalized) (M62.81);Unsteadiness on feet (R26.81)    Time: 1021-1173 PT Time Calculation (min) (ACUTE ONLY): 15  min   Charges:   PT Evaluation $PT Eval Moderate Complexity: 1 Mod            Doreatha Massed, PT Acute Rehabilitation  Office: [phone] Pager: [phone]

## 2021-04-26 LAB — BASIC METABOLIC PANEL
Anion gap: 6 (ref 5–15)
BUN: 20 mg/dL (ref 8–23)
CO2: 24 mmol/L (ref 22–32)
Calcium: 8.4 mg/dL — ABNORMAL LOW (ref 8.9–10.3)
Chloride: 98 mmol/L (ref 98–111)
Creatinine, Ser: 1.05 mg/dL — ABNORMAL HIGH (ref 0.44–1.00)
GFR, Estimated: 57 mL/min — ABNORMAL LOW (ref >60)
Glucose, Bld: 120 mg/dL — ABNORMAL HIGH (ref 70–99)
Potassium: 4.5 mmol/L (ref 3.5–5.1)
Sodium: 128 mmol/L — ABNORMAL LOW (ref 135–145)

## 2021-04-26 LAB — ANTI-DNA ANTIBODY, DOUBLE-STRANDED: ds DNA Ab: <1 IU/mL (ref 0–9)

## 2021-04-26 LAB — C3 COMPLEMENT: C3 Complement: 101 mg/dL (ref 82–167)

## 2021-04-26 LAB — C4 COMPLEMENT: Complement C4, Body Fluid: 22 mg/dL (ref 12–38)

## 2021-04-26 MED ORDER — PREDNISONE 20 MG PO TABS: 40.0000 mg | ORAL_TABLET | Freq: Every day | ORAL | 0 refills | Status: DC

## 2021-04-26 MED ORDER — SULFAMETHOXAZOLE-TRIMETHOPRIM 400-80 MG PO TABS: 1.0000 | ORAL_TABLET | ORAL | 0 refills | Status: DC

## 2021-04-26 MED ORDER — PREDNISONE 20 MG PO TABS: ORAL_TABLET | ORAL | 0 refills | Status: DC

## 2021-04-26 MED ORDER — SODIUM CHLORIDE 1 G PO TABS: 1.0000 g | ORAL_TABLET | Freq: Three times a day (TID) | ORAL | 0 refills | Status: DC

## 2021-04-26 NOTE — Telephone Encounter (Signed)
I called and got the patient in for a hospital follow up on 05/22/21. She was appreciated of a sooner appointment.

## 2021-04-26 NOTE — Telephone Encounter (Signed)
Called and spoke with patient's husband. I did attempt to get her scheduled with RB but he soonest appt is not until February 9th. He wanted to know if she could follow up with MH if he had any sooner appts. I advised him that I would have ask MH if he was ok with this, he verbalized understanding.   MH, would you be ok with her following up with you?

## 2021-04-26 NOTE — TOC Progression Note (Addendum)
Transition of Care [facility]) - Progression Note    Patient Details  Name: Sheila Morales MRN: [id number] Date of Birth: Oct 05, 1950  [facility] CM/SW Contact  Purcell Mouton, RN Phone Number: 04/26/2021, 9:59 AM  Clinical Narrative:    Spoke with pt concerning [place]. Pt states she is active with [facility] and will continue with [facility]. Need resumption of care orders. [place] pt is with [facility].   Expected Discharge Plan: [place] Barriers to Discharge: No Barriers Identified  Expected Discharge Plan and Services Expected Discharge Plan: [place] arrangements for the past 2 months: Single Family Home Expected Discharge Date: 04/26/21                                     Social Determinants of Health (SDOH) Interventions    Readmission Risk Interventions No flowsheet data found.

## 2021-04-26 NOTE — Discharge Summary (Addendum)
Physician Discharge Summary  Sheila Morales BJS:[id number] DOB: September 26, 1950 DOA: 04/23/2021  PCP: Hoyt Koch, MD  Admit date: 04/23/2021  Discharge date: 04/26/2021  Admitted From: Home.  Disposition:  Home health services.  Recommendations for Outpatient Follow-up:  Follow up with PCP in 1-2 weeks. Please obtain BMP/CBC in one week. Advised to follow-up with Pulmonologist Dr. Lamonte Sakai in 2 weeks. Advised to take prednisone 40 mg daily x 14 days, then prednisone 30 mg daily x 14 days, then prednisone 20 mg daily x 14 days, then prednisone 15 mg daily x 14 days, then prednisone 10 mg  daily x 14 days, then prednisone 5 mg daily x 14 days.  Home Health: Home PT Equipment / Devices:None  Discharge Condition: Stable CODE STATUS:Full code Diet recommendation: Heart Healthy   Brief [facility] Course: This 71 years old female with PMH significant for COPD, diastolic congestive heart failure with recent echo showed EF 70 to 75% with grade 2 diastolic dysfunction, lupus, ischemic colitis, atrial fibrillation, SIADH with chronic hyponatremia, anemia of chronic disease, sick sinus syndrome status post pacemaker placement presented in ED with complaints of confusion over last 2 days.   Of note Patient was recently hospitalized at [facility] from 11/ 30-12/11.  Hospital course was remarkable for E. coli bacteremia secondary to UTI and right-sided pyelonephritis.  Patient has also developed new onset atrial fibrillation and acute congestive heart failure and was managed in the ICU and temporary requiring BiPAP.  She was also initiated on a daily prednisone therapy for interstitial lung disease by pulmonology.  Hyponatremia was felt to be secondary to SIADH managed with fluid restrictions and salt tablets.   Patient has been confused and having intermittent fever for last few days at home.  Work-up in the ED showed UA unremarkable , Chest x-ray revealed interstitial infiltrate bilaterally.  BNP  533, CBC shows hemoglobin 7.3 down from 10.4  3 weeks ago.  Patient is started on ceftriaxone and Zithromax for possible multifocal pneumonia.  She was also given Lokelma for hyperkalemia which has improved.  Patient was admitted for acute metabolic encephalopathy, could be multifactorial from ongoing hyponatremia and possible pneumonia.  Patient's hemoglobin remained stable, despite having stool for occult blood+, Patient continued to remain tachypneic and hypoxic.  Pulmonology consulted,  recommended patient will probably need long-term prednisone taper, Patient has significantly improved after getting prednisone.  Patient was also started on Bactrim for PJP prophylaxis given Patient is taking methotrexate and hydroxychloroquine for SLE.  PT recommended home PT.  He is weaned down to room air.  Patient feels better and want to be discharged.  Patient is being discharged home on prednisone taper and patient will follow up with Dr. Lamonte Sakai in 2 weeks.  Appointment has been made.  She was managed for below problems.  Discharge Diagnoses:  Principal Problem:   Acute metabolic encephalopathy Active Problems:   Systemic lupus erythematosus (HCC)   COPD (chronic obstructive pulmonary disease) (HCC)   Hyponatremia   Chronic diastolic CHF (congestive heart failure) (HCC)   AF (paroxysmal atrial fibrillation) (HCC)   Hyperkalemia   Acute blood loss anemia   Fever   Interstitial lung disease (HCC)  Acute metabolic encephalopathy: > Resolved. Patient presented with 3-day history of progressively worsening lethargy and intermittent confusion. Likely multifactorial secondary to progressive infectious process. Patient has chronic hyponatremia secondary to SIADH. Mild hyponatremia is also contributing but unlikely the sole cause. CT head: No acute intracranial abnormality noted. UA unremarkable, chest x-ray showed residual infection.  Continue empiric antibiotics ceftriaxone and Zithromax. Procalcitonin  normal.  Mental status has significantly improved. Blood cultures no growth to date, no leukocytosis, remained afebrile in last 24 hours.     Acute blood loss anemia: Patient presented with significant drop in Hb 7.3 from 10.4 Patient and family denies any melena or hematemesis. Stool for occult blood positive in ED. Repeat hemoglobin 10.3 appears normal. Slow gastrointestinal bleeding could be secondary to Eliquis use. Continue Protonix 40 mg every 12 hours. Hemoglobin remained stable around 9.5.   Fever : Patient presented with history of recurrent fevers. There is no other SIRS criteria met. Patient was recently treated for E. coli bacteremia. Chest x-ray reveals interstitial infiltrate however this is unchanged from prior. CT chest showed findings consistent with multifocal pneumonia. Continue empiric antibiotics ceftriaxone and Zithromax. Procalcitonin normal, blood cultures no growth so far.   Hyponatremia: Patient does have chronic hyponatremia due to SIADH. Patient has been not adhering to any strict fluid restrictions since discharge. Continue fluid restrictions, serum sodium is improving. Sodium 124> 129>130 >128 >131   Hyperkalemia: Mild hyperkalemia with no EKG changes noted. Patient has received Lokelma x 1. Serum potassium improved.   Paroxysmal atrial fibrillation It was diagnosed during recent hospitalization. Currently patient is  in normal sinus rhythm and rate is controlled. Continue amiodarone, diltiazem, metoprolol and Eliquis.   Insterstitial lung disease: She was diagnosed with interstitial lung disease by pulmonology on last admission. Patient was discharged on prednisone taper which she has completed. Continue as needed bronchodilators.   Pulmonology consulted recommended to continue current antibiotics, Started patient on prednisone taper and Bactrim for PJP prophylaxis given immunocompromised state.   Chronic diastolic CHF: BNP slightly  elevated, this is dramatically lower than prior hospitalization BNP. There is no clinical evidence of volume overloaded noted.   SLE: Continue home regimen of methotrexate and hydroxychlorquine   COPD: No evidence of exacerbation at this time.   Continue home regimen of maintenance inhalers.     Discharge Instructions  Discharge Instructions     Call MD for:  difficulty breathing, headache or visual disturbances   Complete by: As directed    Call MD for:  persistant dizziness or light-headedness   Complete by: As directed    Call MD for:  persistant nausea and vomiting   Complete by: As directed    Diet - low sodium heart healthy   Complete by: As directed    Diet Carb Modified   Complete by: As directed    Discharge instructions   Complete by: As directed    Advised to follow-up with primary care physician in 1 week. Advised to follow-up with pulmonologist Dr. Lamonte Sakai in 2 weeks. Take prednisone 40 mg daily x 14 days, then prednisone 30 mg x 14 days, then prednisone 20 mg x 14 days, then prednisone 15 mg x 14 days, then prednisone mg x 14 days, then prednisone 5 mg x 14 days.   Increase activity slowly   Complete by: As directed       Allergies as of 04/26/2021   No Known Allergies      Medication List     TAKE these medications    Advanced Joint Relief Caps Take 1 capsule by mouth daily. Joint Advantage Gold 5x   albuterol 108 (90 Base) MCG/ACT inhaler Commonly known as: VENTOLIN HFA Inhale 1-2 puffs into the lungs every 6 (six) hours as needed for wheezing or shortness of breath.   amiodarone 200 MG tablet Commonly known as:  PACERONE Take 1 tablet (200 mg total) by mouth 2 (two) times daily.   Anoro Ellipta 62.5-25 MCG/ACT Aepb Generic drug: umeclidinium-vilanterol TAKE 1 PUFF BY MOUTH EVERY DAY   apixaban 5 MG Tabs tablet Commonly known as: ELIQUIS Take 1 tablet (5 mg total) by mouth 2 (two) times daily.   benzonatate 200 MG capsule Commonly known as:  TESSALON Take 1 capsule (200 mg total) by mouth every 6 (six) hours as needed for cough.   calcium carbonate 1250 (500 Ca) MG tablet Commonly known as: OS-CAL - dosed in mg of elemental calcium Take 1 tablet by mouth every evening.   diltiazem 360 MG 24 hr capsule Commonly known as: CARDIZEM CD Take 1 capsule (360 mg total) by mouth daily.   folic acid 1 MG tablet Commonly known as: FOLVITE Take 1 mg by mouth every evening.   furosemide 20 MG tablet Commonly known as: LASIX Take 1 tablet (20 mg total) by mouth daily.   gabapentin 300 MG capsule Commonly known as: NEURONTIN TAKE TWO CAPSULES BY MOUTH EVERY MORNING, TWO CAPSULES EACH AFTERNOON AND THREE CAPSULES AT BEDTIME What changed: See the new instructions.   hydroxychloroquine 200 MG tablet Commonly known as: PLAQUENIL Take 200 mg by mouth daily.   methotrexate 2.5 MG tablet Take 10 mg by mouth every Thursday.   metoprolol tartrate 25 MG tablet Commonly known as: LOPRESSOR Take 1 tablet (25 mg total) by mouth 2 (two) times daily.   multivitamin with minerals Tabs tablet Take 1 tablet by mouth daily.   nystatin 100000 UNIT/ML suspension Commonly known as: MYCOSTATIN Take 5 mLs (500,000 Units total) by mouth in the morning, at noon, and at bedtime.   predniSONE 20 MG tablet Commonly known as: DELTASONE Take prednisone 40 mg daily x 14 days, then prednisone 30 mg x 14 days, then prednisone 20 mg x 14 days, then prednisone 15 mg x 14 days, then prednisone mg x 14 days, then prednisone 5 mg x 14 days. What changed:  medication strength additional instructions   Restasis 0.05 % ophthalmic emulsion Generic drug: cycloSPORINE Place 1 drop into both eyes 2 (two) times daily.   sodium chloride 1 g tablet Take 1 tablet (1 g total) by mouth 3 (three) times daily with meals.   sulfamethoxazole-trimethoprim 400-80 MG tablet Commonly known as: BACTRIM Take 1 tablet by mouth 3 (three) times a week. Start taking on:  April 27, 2021   vitamin C 500 MG tablet Commonly known as: ASCORBIC ACID Take 500 mg by mouth daily.   Vitamin D 50 MCG (2000 UT) tablet Take 2,000 Units by mouth every evening.   vitamin E 180 MG (400 UNITS) capsule Take 400 Units by mouth daily.   zolpidem 5 MG tablet Commonly known as: AMBIEN Take 1 tablet (5 mg total) by mouth at bedtime as needed for sleep.        Follow-up Information     Hoyt Koch, MD Follow up in 1 week(s).   Specialty: Internal Medicine Contact information: [place] [address] [phone]         Elouise Munroe, MD .   Specialties: Cardiology, Radiology Contact information: [place] [address] [phone]         Collene Gobble, MD Follow up in 2 week(s).   Specialty: Pulmonary Disease Contact information: [place]  [address] [phone]  No Known Allergies  Consultations:  Pulmonology   Procedures/Studies: CT Head Wo Contrast  Result Date: 04/23/2021 CLINICAL DATA:  Headache and fatigue.  Fever for 2 days. EXAM: CT HEAD WITHOUT CONTRAST TECHNIQUE: Contiguous axial images were obtained from the base of the skull through the vertex without intravenous contrast. COMPARISON:  None. FINDINGS: Brain: No evidence of acute infarction, hemorrhage, hydrocephalus, extra-axial collection or mass lesion/mass effect. Patchy areas of white matter hypoattenuation are noted consistent with mild chronic microvascular ischemic change. Vascular: No hyperdense vessel or unexpected calcification. Skull: Normal. Negative for fracture or focal lesion. Sinuses/Orbits: Visualized globes and orbits are unremarkable. Visualized sinuses are clear. Other: None. IMPRESSION: 1. No acute intracranial abnormalities. Electronically Signed   By: Lajean Manes M.D.   On: 04/23/2021 11:38   CT CHEST ABDOMEN PELVIS W CONTRAST  Result Date:  04/24/2021 CLINICAL DATA:  71 year old female with sepsis, recurrent fever, immunocompromised. EXAM: CT CHEST, ABDOMEN, AND PELVIS WITH CONTRAST TECHNIQUE: Multidetector CT imaging of the chest, abdomen and pelvis was performed following the standard protocol during bolus administration of intravenous contrast. CONTRAST:  31m OMNIPAQUE IOHEXOL 350 MG/ML SOLN COMPARISON:  Portable chest 04/23/2021. CT Chest, Abdomen, and Pelvis 03/26/2021. FINDINGS: CT CHEST FINDINGS Cardiovascular: Left chest transvenous pacemaker device. Calcified coronary artery atherosclerosis. Calcified aortic atherosclerosis. Left atrial enlargement suspected but overall cardiac size within normal limits. No pericardial effusion. Major mediastinal vascular structures appear to be patent. Mediastinum/Nodes: Reactive appearing mediastinal and hilar lymph nodes, individually up to 13 mm short axis anterior to the carina. Lungs/Pleura: Bilateral pleural effusions have regressed, trace residual pleural fluid now. Major airways are patent and lung volumes are larger. Regressed but not resolved bilateral upper lobe pulmonary opacity, ground-glass type and confluent now greater on the right. Superimposed emphysema and lung scarring including some areas of early subpleural honeycombing. Improved bibasilar ventilation with patchy residual lower lobe peribronchial opacity. Musculoskeletal: Chronic left anterior rib fractures. No acute or suspicious osseous abnormality identified. CT ABDOMEN PELVIS FINDINGS Hepatobiliary: Absent gallbladder. Liver and bile ducts within normal limits. Pancreas: Negative. Spleen: Negative. Adrenals/Urinary Tract: Normal adrenal glands. Nonobstructed kidneys with symmetric renal enhancement and contrast excretion although there is on going asymmetric right pararenal space inflammation and/or fluid with simple fluid density (series 5, image 58). But no evidence of acute pyelonephritis. And ureters appear to remain normal.  Larger but unremarkable bladder. Stomach/Bowel: Retained stool and diverticulosis of the large bowel in the descending and sigmoid segments. No active inflammation. Redundant transverse colon with similar retained stool. Negative right colon with some residual oral contrast suspected. Fluid in the terminal ileum which is within normal limits. Normal appendix visible on coronal image 57. No dilated small bowel. Decompressed stomach and duodenum. No free air, free fluid, or mesenteric inflammation. Vascular/Lymphatic: Extensive Aortoiliac calcified atherosclerosis. Major arterial structures remain patent. Portal venous system appears to be patent. No lymphadenopathy identified. Reproductive: Negative. Other: No pelvic free fluid. Musculoskeletal: Lumbar disc and endplate degeneration associated with mild levoconvex scoliosis. No acute or suspicious osseous process identified. IMPRESSION: 1. Regressed but not resolved confluent bilateral upper lobe predominant lung opacity, mostly ground-glass. Favor regressed multifocal pneumonia. Underlying chronic lung disease including Emphysema (ICD10-J43.9). Decreased pleural effusions, trace residual. Reactive appearing mediastinal and hilar lymph nodes. 2. Persistent asymmetric right pararenal space fluid and/or inflammation but no CT evidence of pyelonephritis or obstructive uropathy. 3. No other acute or inflammatory process identified in the chest, abdomen, or pelvis. Aortic Atherosclerosis (ICD10-I70.0). Electronically Signed   By: HHerminio HeadsD.  On: 04/24/2021 09:35   DG Chest Portable 1 View  Result Date: 04/23/2021 CLINICAL DATA:  Fever for 2 days.  Headache and fatigue. EXAM: PORTABLE CHEST 1 VIEW COMPARISON:  03/30/2021. FINDINGS: Cardiac silhouette is mildly enlarged. Stable left anterior chest wall sequential pacemaker. No mediastinal or hilar masses. Bilateral interstitial type opacities. Subtle superimposed airspace opacity in the mid lungs. Additional lung base  opacities consistent with atelectasis, with small effusion suspected. No pneumothorax. Skeletal structures grossly intact. IMPRESSION: 1. Interstitial with subtle hazy airspace opacities, similar to the prior chest radiograph. Residual or recurrent multifocal infection suspected. 2. Cardiomegaly.  Small effusions. Electronically Signed   By: Lajean Manes M.D.   On: 04/23/2021 11:34   DG CHEST PORT 1 VIEW  Result Date: 03/30/2021 CLINICAL DATA:  Atrial fibrillation with short of breath EXAM: PORTABLE CHEST 1 VIEW COMPARISON:  03/28/2021 FINDINGS: Improved bilateral airspace disease likely due to clearing pneumonia or edema. Underlying COPD. Small pleural effusions bilaterally. Dual lead pacemaker unchanged. IMPRESSION: Interval improvement in bilateral airspace disease due to clearing edema or pneumonia. Small bilateral effusions. Electronically Signed   By: Franchot Gallo M.D.   On: 03/30/2021 15:11   DG CHEST PORT 1 VIEW  Result Date: 03/28/2021 CLINICAL DATA:  Pneumonia. EXAM: PORTABLE CHEST 1 VIEW COMPARISON:  CT chest dated March 26, 2021. Chest x-ray dated March 25, 2021. FINDINGS: Unchanged left chest wall pacemaker and cardiomegaly. Patchy bilateral airspace disease in both lungs appears improved in the left upper lobe, but not significantly changed on the right. No pleural effusion or pneumothorax. No acute osseous abnormality. IMPRESSION: 1. Multifocal pneumonia, improved in the left upper lobe. Electronically Signed   By: Titus Dubin M.D.   On: 03/28/2021 08:40   DG Foot Complete Left  Result Date: 04/14/2021 CLINICAL DATA:  Toe discoloration EXAM: LEFT FOOT - COMPLETE 3+ VIEW COMPARISON:  None. FINDINGS: No fracture or malalignment. Mild degenerative change at the first MTP joint. Vascular calcifications. Moderate plantar calcaneal spur IMPRESSION: No acute osseous abnormality Electronically Signed   By: Donavan Foil M.D.   On: 04/14/2021 18:55   US PELVIC COMPLETE WITH  TRANSVAGINAL  Result Date: 03/29/2021 CLINICAL DATA:  Pelvic mass EXAM: TRANSABDOMINAL AND TRANSVAGINAL ULTRASOUND OF PELVIS TECHNIQUE: Both transabdominal and transvaginal ultrasound examinations of the pelvis were performed. Transabdominal technique was performed for global imaging of the pelvis including uterus, ovaries, adnexal regions, and pelvic cul-de-sac. It was necessary to proceed with endovaginal exam following the transabdominal exam to visualize the uterus endometrium ovaries. COMPARISON:  CT 03/26/2021 FINDINGS: Uterus Measurements: 6.6 x 3.5 x 3.7 cm = volume: 44.9 mL. No fibroids or other mass visualized. Endometrium Thickness: 3.5 mm. Small amount of fluid in the endometrial canal. Hypoechoic nodule measuring 7 by 3 x 4 mm. Right ovary Not visualized.  Dilated pelvic vessels. Left ovary Meaurements: 2.9 x 1.4 x 1.1 cm Volume:2.4 mL. No adnexal mass is seen. There is no cystic structure visible in the left adnexa. There are dilated pelvic vessels. Other findings None IMPRESSION: 1. The described cystic lesion at the left adnexa is not seen by sonography. 2. Small amount of fluid in the endometrial canal with suspected 7 mm endometrial nodule. Gynecology consultation with possible tissue sampling is recommended. 3. Dilated left greater than right pelvic vessels. Electronically Signed   By: Donavan Foil M.D.   On: 03/29/2021 16:09      Subjective: Patient was seen and examined at bedside.  Overnight events noted.   Patient reports feeling much  improved.  she has been ambulating without difficulty breathing. She is needing home physical therapy which was arranged.  Patient is being discharged home.  Discharge Exam: Vitals:   04/26/21 0516 04/26/21 0925  BP: (!) 149/91 137/74  Pulse: 75 71  Resp: 16   Temp: 97.6 F (36.4 C)   SpO2: 92%    Vitals:   04/25/21 1307 04/25/21 2147 04/26/21 0516 04/26/21 0925  BP: (!) 108/58 117/63 (!) 149/91 137/74  Pulse: 74 71 75 71  Resp: 16 15 16     Temp: 98.4 F (36.9 C) 98.2 F (36.8 C) 97.6 F (36.4 C)   TempSrc: Oral Oral Oral   SpO2: 93% 95% 92%   Weight:      Height:        General: Pt is alert, awake, not in acute distress Cardiovascular: RRR, S1/S2 +, no rubs, no gallops Respiratory: CTA bilaterally, no wheezing, no rhonchi Abdominal: Soft, NT, ND, bowel sounds + Extremities: no edema, no cyanosis    The results of significant diagnostics from this hospitalization (including imaging, microbiology, ancillary and laboratory) are listed below for reference.     Microbiology: Recent Results (from the past 240 hour(s))  Resp Panel by RT-PCR (Flu A&B, Covid) Nasopharyngeal Swab     Status: None   Collection Time: 04/23/21 10:41 AM   Specimen: Nasopharyngeal Swab; Nasopharyngeal(NP) swabs in vial transport medium  Result Value Ref Range Status   SARS Coronavirus 2 by RT PCR NEGATIVE NEGATIVE Final    Comment: (NOTE) SARS-CoV-2 target nucleic acids are NOT DETECTED.  The SARS-CoV-2 RNA is generally detectable in upper respiratory specimens during the acute phase of infection. The lowest concentration of SARS-CoV-2 viral copies this assay can detect is 138 copies/mL. A negative result does not preclude SARS-Cov-2 infection and should not be used as the sole basis for treatment or other patient management decisions. A negative result may occur with  improper specimen collection/handling, submission of specimen other than nasopharyngeal swab, presence of viral mutation(s) within the areas targeted by this assay, and inadequate number of viral copies(<138 copies/mL). A negative result must be combined with clinical observations, patient history, and epidemiological information. The expected result is Negative.  Fact Sheet for Patients:  EntrepreneurPulse.com.au  Fact Sheet for Healthcare Providers:  IncredibleEmployment.be  This test is no t yet approved or cleared by the [place] FDA and  has been authorized for detection and/or diagnosis of SARS-CoV-2 by FDA under an Emergency Use Authorization (EUA). This EUA will remain  in effect (meaning this test can be used) for the duration of the COVID-19 declaration under Section 564(b)(1) of the Act, 21 U.S.C.section 360bbb-3(b)(1), unless the authorization is terminated  or revoked sooner.       Influenza A by PCR NEGATIVE NEGATIVE Final   Influenza B by PCR NEGATIVE NEGATIVE Final    Comment: (NOTE) The Xpert Xpress SARS-CoV-2/FLU/RSV plus assay is intended as an aid in the diagnosis of influenza from Nasopharyngeal swab specimens and should not be used as a sole basis for treatment. Nasal washings and aspirates are unacceptable for Xpert Xpress SARS-CoV-2/FLU/RSV testing.  Fact Sheet for Patients: EntrepreneurPulse.com.au  Fact Sheet for Healthcare Providers: IncredibleEmployment.be  This test is not yet approved or cleared by the [place] FDA and has been authorized for detection and/or diagnosis of SARS-CoV-2 by FDA under an Emergency Use Authorization (EUA). This EUA will remain in effect (meaning this test can be used) for the duration of the COVID-19 declaration under Section 564(b)(1)  of the Act, 21 U.S.C. section 360bbb-3(b)(1), unless the authorization is terminated or revoked.  Performed at [facility], [address], [place], [place] [address]   Culture, blood (routine x 2)     Status: None (Preliminary result)   Collection Time: 04/24/21  1:32 AM   Specimen: BLOOD  Result Value Ref Range Status   Specimen Description   Final    BLOOD RIGHT ANTECUBITAL Performed at [facility] [address], [place], [place] [address]    Special Requests   Final    BOTTLES DRAWN AEROBIC AND ANAEROBIC Blood Culture adequate volume Performed at [facility] [address] [place], [place]  [address]    Culture   Final    NO GROWTH 2 DAYS Performed at [facility] [address], [place], [place] [address]    Report Status PENDING  Incomplete  Culture, blood (routine x 2)     Status: None (Preliminary result)   Collection Time: 04/24/21  1:32 AM   Specimen: BLOOD  Result Value Ref Range Status   Specimen Description   Final    BLOOD BLOOD RIGHT HAND Performed at [facility] [address], [place], [place] [address]    Special Requests   Final    BOTTLES DRAWN AEROBIC ONLY Blood Culture adequate volume Performed at [facility] [address] [place], [place] [address]    Culture   Final    NO GROWTH 2 DAYS Performed at [facility] [address], [place], [place] [address]    Report Status PENDING  Incomplete     Labs: BNP (last 3 results) Recent Labs    03/24/21 0011 03/28/21 0226 04/23/21 1041  BNP 2,707.6* 680.4* 952.8*   Basic Metabolic Panel: Recent Labs  Lab 04/23/21 1041 04/23/21 2347 04/24/21 0507 04/24/21 0906 04/24/21 1205 04/24/21 1650 04/25/21 0357 04/26/21 0407  NA 124* 126* 129*  --  128* 128*  --  128*  K 5.7* 4.7 5.3* 4.8 5.0 4.1  --  4.5  CL 89* 95* 93*  --  95* 97*  --  98  CO2 28 24 28   --  26 24  --  24  GLUCOSE 84 169* 107*  --  98 80  --  120*  BUN 25* 22 24*  --  22 23  --  20  CREATININE 1.36* 1.30* 1.27*  --  1.16* 1.14*  --  1.05*  CALCIUM 9.0 8.0* 8.2*  --  8.2* 7.8*  --  8.4*  MG 2.3  --  2.1  --   --   --  2.2  --   PHOS  --   --   --   --   --   --  3.7  --    Liver Function Tests: Recent Labs  Lab 04/23/21 1041 04/24/21 0507  AST 30 20  ALT 22 21  ALKPHOS 60 55  BILITOT 0.7 0.5  PROT 7.5 6.3*  ALBUMIN 3.7 3.0*   No results for input(s): LIPASE, AMYLASE in the last 168 hours. Recent Labs  Lab 04/23/21 1141  AMMONIA 26   CBC: Recent Labs  Lab 04/23/21 1041 04/23/21 2347 04/24/21 0507 04/24/21 1205 04/25/21 1246  WBC 8.5 6.8 6.9 8.2 7.8   NEUTROABS 6.8  --  5.1  --   --   HGB 7.3* 10.1* 9.6* 9.8* 9.8*  HCT 21.9* 30.5* 28.6* 29.9* 29.7*  MCV 93.6 97.1  95.0 95.8 95.5  PLT 237 181 180 183 200   Cardiac Enzymes: No results for input(s): CKTOTAL, CKMB, CKMBINDEX, TROPONINI in the last 168 hours. BNP: Invalid input(s): POCBNP CBG: No results for input(s): GLUCAP in the last 168 hours. D-Dimer No results for input(s): DDIMER in the last 72 hours.  Hgb A1c No results for input(s): HGBA1C in the last 72 hours. Lipid Profile No results for input(s): CHOL, HDL, LDLCALC, TRIG, CHOLHDL, LDLDIRECT in the last 72 hours. Thyroid function studies No results for input(s): TSH, T4TOTAL, T3FREE, THYROIDAB in the last 72 hours.  Invalid input(s): FREET3 Anemia work up No results for input(s): VITAMINB12, FOLATE, FERRITIN, TIBC, IRON, RETICCTPCT in the last 72 hours. Urinalysis    Component Value Date/Time   COLORURINE YELLOW 04/23/2021 1600   APPEARANCEUR CLEAR 04/23/2021 1600   LABSPEC 1.009 04/23/2021 1600   PHURINE 7.0 04/23/2021 1600   GLUCOSEU NEGATIVE 04/23/2021 1600   HGBUR NEGATIVE 04/23/2021 1600   BILIRUBINUR NEGATIVE 04/23/2021 1600   KETONESUR NEGATIVE 04/23/2021 1600   PROTEINUR NEGATIVE 04/23/2021 1600   NITRITE NEGATIVE 04/23/2021 1600   LEUKOCYTESUR NEGATIVE 04/23/2021 1600   Sepsis Labs Invalid input(s): PROCALCITONIN,  WBC,  LACTICIDVEN Microbiology Recent Results (from the past 240 hour(s))  Resp Panel by RT-PCR (Flu A&B, Covid) Nasopharyngeal Swab     Status: None   Collection Time: 04/23/21 10:41 AM   Specimen: Nasopharyngeal Swab; Nasopharyngeal(NP) swabs in vial transport medium  Result Value Ref Range Status   SARS Coronavirus 2 by RT PCR NEGATIVE NEGATIVE Final    Comment: (NOTE) SARS-CoV-2 target nucleic acids are NOT DETECTED.  The SARS-CoV-2 RNA is generally detectable in upper respiratory specimens during the acute phase of infection. The lowest concentration of SARS-CoV-2 viral copies  this assay can detect is 138 copies/mL. A negative result does not preclude SARS-Cov-2 infection and should not be used as the sole basis for treatment or other patient management decisions. A negative result may occur with  improper specimen collection/handling, submission of specimen other than nasopharyngeal swab, presence of viral mutation(s) within the areas targeted by this assay, and inadequate number of viral copies(<138 copies/mL). A negative result must be combined with clinical observations, patient history, and epidemiological information. The expected result is Negative.  Fact Sheet for Patients:  EntrepreneurPulse.com.au  Fact Sheet for Healthcare Providers:  IncredibleEmployment.be  This test is no t yet approved or cleared by the [place] FDA and  has been authorized for detection and/or diagnosis of SARS-CoV-2 by FDA under an Emergency Use Authorization (EUA). This EUA will remain  in effect (meaning this test can be used) for the duration of the COVID-19 declaration under Section 564(b)(1) of the Act, 21 U.S.C.section 360bbb-3(b)(1), unless the authorization is terminated  or revoked sooner.       Influenza A by PCR NEGATIVE NEGATIVE Final   Influenza B by PCR NEGATIVE NEGATIVE Final    Comment: (NOTE) The Xpert Xpress SARS-CoV-2/FLU/RSV plus assay is intended as an aid in the diagnosis of influenza from Nasopharyngeal swab specimens and should not be used as a sole basis for treatment. Nasal washings and aspirates are unacceptable for Xpert Xpress SARS-CoV-2/FLU/RSV testing.  Fact Sheet for Patients: EntrepreneurPulse.com.au  Fact Sheet for Healthcare Providers: IncredibleEmployment.be  This test is not yet approved or cleared by the [place] FDA and has been authorized for detection and/or diagnosis of SARS-CoV-2 by FDA under an Emergency Use Authorization (EUA). This EUA  will remain in effect (meaning this test can be used)  for the duration of the COVID-19 declaration under Section 564(b)(1) of the Act, 21 U.S.C. section 360bbb-3(b)(1), unless the authorization is terminated or revoked.  Performed at [facility], [address], [place], [place] [address]   Culture, blood (routine x 2)     Status: None (Preliminary result)   Collection Time: 04/24/21  1:32 AM   Specimen: BLOOD  Result Value Ref Range Status   Specimen Description   Final    BLOOD RIGHT ANTECUBITAL Performed at [facility] [address] [place], [place] [address]    Special Requests   Final    BOTTLES DRAWN AEROBIC AND ANAEROBIC Blood Culture adequate volume Performed at [facility] [address], [place], [place] [address]    Culture   Final    NO GROWTH 2 DAYS Performed at [facility] [address], [place], [place] [address]    Report Status PENDING  Incomplete  Culture, blood (routine x 2)     Status: None (Preliminary result)   Collection Time: 04/24/21  1:32 AM   Specimen: BLOOD  Result Value Ref Range Status   Specimen Description   Final    BLOOD BLOOD RIGHT HAND Performed at [facility] [address], [place], [place] [address]    Special Requests   Final    BOTTLES DRAWN AEROBIC ONLY Blood Culture adequate volume Performed at [facility] [address], [place], [place] [address]    Culture   Final    NO GROWTH 2 DAYS Performed at [facility] [address], [place], [place] [address]    Report Status PENDING  Incomplete     Time coordinating discharge: Over 30 minutes  SIGNED:   Shawna Clamp, MD  [facility] 04/26/2021, 3:44 PM Pager   If 7PM-7AM, please contact night-coverage

## 2021-04-26 NOTE — Telephone Encounter (Signed)
Ok that is ok with me as hosp f/u, 30 min.

## 2021-04-27 ENCOUNTER — Telehealth: Payer: Self-pay

## 2021-04-27 ENCOUNTER — Ambulatory Visit (HOSPITAL_BASED_OUTPATIENT_CLINIC_OR_DEPARTMENT_OTHER): Payer: PPO | Admitting: Family

## 2021-04-27 NOTE — Telephone Encounter (Signed)
Transition Care Management Unsuccessful Follow-up Telephone Call  Date of discharge and from where:  04/26/2021 from [facility]  Attempts:  1st Attempt  Reason for unsuccessful TCM follow-up call:  Unable to leave message   Nurse Note: Not able to leave message due to no detailed answering service.

## 2021-04-29 LAB — CULTURE, BLOOD (ROUTINE X 2)
Culture: NO GROWTH
Culture: NO GROWTH
Special Requests: ADEQUATE
Special Requests: ADEQUATE

## 2021-04-30 ENCOUNTER — Telehealth: Payer: Self-pay

## 2021-04-30 ENCOUNTER — Other Ambulatory Visit: Payer: Self-pay

## 2021-04-30 DIAGNOSIS — R7881 Bacteremia: Secondary | ICD-10-CM | POA: Diagnosis not present

## 2021-04-30 DIAGNOSIS — Z7982 Long term (current) use of aspirin: Secondary | ICD-10-CM | POA: Diagnosis not present

## 2021-04-30 DIAGNOSIS — J449 Chronic obstructive pulmonary disease, unspecified: Secondary | ICD-10-CM | POA: Diagnosis not present

## 2021-04-30 DIAGNOSIS — M329 Systemic lupus erythematosus, unspecified: Secondary | ICD-10-CM | POA: Diagnosis not present

## 2021-04-30 DIAGNOSIS — Z950 Presence of cardiac pacemaker: Secondary | ICD-10-CM | POA: Diagnosis not present

## 2021-04-30 DIAGNOSIS — I4891 Unspecified atrial fibrillation: Secondary | ICD-10-CM | POA: Diagnosis not present

## 2021-04-30 DIAGNOSIS — G9341 Metabolic encephalopathy: Secondary | ICD-10-CM | POA: Diagnosis not present

## 2021-04-30 DIAGNOSIS — I10 Essential (primary) hypertension: Secondary | ICD-10-CM | POA: Diagnosis not present

## 2021-04-30 DIAGNOSIS — I5033 Acute on chronic diastolic (congestive) heart failure: Secondary | ICD-10-CM | POA: Diagnosis not present

## 2021-04-30 DIAGNOSIS — Z7901 Long term (current) use of anticoagulants: Secondary | ICD-10-CM | POA: Diagnosis not present

## 2021-04-30 NOTE — Telephone Encounter (Signed)
Transition Care Management Follow-up Telephone Call Date of discharge and from where: [facility] 04-26-21 Dx: acute metabolic encephalopathy  How have you been since you were released from the hospital? Feeling better  Any questions or concerns? No  Items Reviewed: Did the pt receive and understand the discharge instructions provided? Yes  Medications obtained and verified? Yes  Other? No  Any new allergies since your discharge? No  Dietary orders reviewed? Yes Do you have support at home? Yes   Home Care and Equipment/Supplies: Were home health services ordered? Yes PT If so, what is the name of the agency? [facility]   Has the agency set up a time to come to the patient's home? yes Were any new equipment or medical supplies ordered?  No What is the name of the medical supply agency?[facility] Were you able to get the supplies/equipment? not applicable Do you have any questions related to the use of the equipment or supplies? No  Functional Questionnaire: (I = Independent and D = Dependent) ADLs: I  Bathing/Dressing- I  Meal Prep- I  Eating- I  Maintaining continence- I  Transferring/Ambulation- I  Managing Meds- I  Follow up appointments reviewed:  PCP Hospital f/u appt confirmed? Yes  Scheduled to see Dr Sharlet Salina on 05-04-21 @ [facility] f/u appt confirmed? No . Are transportation arrangements needed? No  If their condition worsens, is the pt aware to call PCP or go to the Emergency Dept.? Yes Was the patient provided with contact information for the PCP's office or ED? Yes Was to pt encouraged to call back with questions or concerns? Yes

## 2021-04-30 NOTE — Telephone Encounter (Signed)
This is Dr. Acharya's pt ?

## 2021-05-01 MED ORDER — DILTIAZEM HCL ER COATED BEADS 360 MG PO CP24
360.0000 mg | ORAL_CAPSULE | Freq: Every day | ORAL | 0 refills | Status: DC
Start: 2021-05-01 — End: 2021-06-15

## 2021-05-01 MED ORDER — AMIODARONE HCL 200 MG PO TABS: 200.0000 mg | ORAL_TABLET | Freq: Two times a day (BID) | ORAL | 0 refills | Status: DC

## 2021-05-01 MED ORDER — APIXABAN 5 MG PO TABS: 5.0000 mg | ORAL_TABLET | Freq: Two times a day (BID) | ORAL | 0 refills | Status: DC

## 2021-05-01 MED ORDER — FUROSEMIDE 20 MG PO TABS: 20.0000 mg | ORAL_TABLET | Freq: Every day | ORAL | 0 refills | Status: DC

## 2021-05-01 MED ORDER — METOPROLOL TARTRATE 25 MG PO TABS: 25.0000 mg | ORAL_TABLET | Freq: Two times a day (BID) | ORAL | 0 refills | Status: DC

## 2021-05-01 NOTE — Telephone Encounter (Signed)
Prescription refill request for Eliquis received. Indication: afib  Last office visit: 09/07/2020, Lovena Le Scr: 1.05, 04/26/2021 Age: 71 yo  Weight: 61.5 kg   Refill sent.

## 2021-05-04 ENCOUNTER — Other Ambulatory Visit: Payer: Self-pay

## 2021-05-04 ENCOUNTER — Encounter: Payer: Self-pay | Admitting: Internal Medicine

## 2021-05-04 ENCOUNTER — Ambulatory Visit (INDEPENDENT_AMBULATORY_CARE_PROVIDER_SITE_OTHER): Payer: PPO | Admitting: Internal Medicine

## 2021-05-04 VITALS — BP 132/68 | HR 70 | Resp 18 | Ht 62.0 in | Wt 130.8 lb

## 2021-05-04 DIAGNOSIS — J189 Pneumonia, unspecified organism: Secondary | ICD-10-CM | POA: Diagnosis not present

## 2021-05-04 DIAGNOSIS — E871 Hypo-osmolality and hyponatremia: Secondary | ICD-10-CM | POA: Diagnosis not present

## 2021-05-04 DIAGNOSIS — D62 Acute posthemorrhagic anemia: Secondary | ICD-10-CM

## 2021-05-04 DIAGNOSIS — M3213 Lung involvement in systemic lupus erythematosus: Secondary | ICD-10-CM | POA: Diagnosis not present

## 2021-05-04 DIAGNOSIS — G9341 Metabolic encephalopathy: Secondary | ICD-10-CM | POA: Diagnosis not present

## 2021-05-04 DIAGNOSIS — J439 Emphysema, unspecified: Secondary | ICD-10-CM

## 2021-05-04 LAB — COMPREHENSIVE METABOLIC PANEL
ALT: 44 U/L — ABNORMAL HIGH (ref 0–35)
AST: 23 U/L (ref 0–37)
Albumin: 3.7 g/dL (ref 3.5–5.2)
Alkaline Phosphatase: 59 U/L (ref 39–117)
BUN: 30 mg/dL — ABNORMAL HIGH (ref 6–23)
CO2: 27 mEq/L (ref 19–32)
Calcium: 9.6 mg/dL (ref 8.4–10.5)
Chloride: 95 mEq/L — ABNORMAL LOW (ref 96–112)
Creatinine, Ser: 1.25 mg/dL — ABNORMAL HIGH (ref 0.40–1.20)
GFR: 43.37 mL/min — ABNORMAL LOW (ref >60.00)
Glucose, Bld: 85 mg/dL (ref 70–99)
Potassium: 4.2 mEq/L (ref 3.5–5.1)
Sodium: 130 mEq/L — ABNORMAL LOW (ref 135–145)
Total Bilirubin: 0.4 mg/dL (ref 0.2–1.2)
Total Protein: 6.8 g/dL (ref 6.0–8.3)

## 2021-05-04 LAB — CBC
HCT: 31.5 % — ABNORMAL LOW (ref 36.0–46.0)
Hemoglobin: 10.4 g/dL — ABNORMAL LOW (ref 12.0–15.0)
MCHC: 33.2 g/dL (ref 30.0–36.0)
MCV: 95.1 fl (ref 78.0–100.0)
Platelets: 426 10*3/uL — ABNORMAL HIGH (ref 150.0–400.0)
RBC: 3.31 Mil/uL — ABNORMAL LOW (ref 3.87–5.11)
RDW: 15.9 % — ABNORMAL HIGH (ref 11.5–15.5)
WBC: 12.1 10*3/uL — ABNORMAL HIGH (ref 4.0–10.5)

## 2021-05-04 MED ORDER — NYSTATIN 100000 UNIT/ML MT SUSP: 5.0000 mL | Freq: Two times a day (BID) | OROMUCOSAL | 2 refills | Status: DC

## 2021-05-04 NOTE — Patient Instructions (Signed)
We will check the labs.   

## 2021-05-04 NOTE — Assessment & Plan Note (Signed)
Checking CBC. No active signs of bleeding since discharge.

## 2021-05-04 NOTE — Progress Notes (Signed)
° °  Subjective:   Patient ID: Sheila Morales, female    DOB: July 03, 1950, 71 y.o.   MRN: [id number]  HPI The patient is a 71 YO female coming in for hospital follow up (in for confusion and low sodium with likely pneumonia, treated for sodium levels with fluids, antibiotics and steroids for respiratory status). She had CT abdomen/chest/pelvis while in hospital. Discharged on sodium tablets as this is the second time recently with low levels. She is doing well since discharge no confusion but some mental slowness random. Eating and drinking well.   PMH, [facility], social history reviewed and updated  Review of Systems  Constitutional:  Positive for activity change and fatigue.  HENT: Negative.    Eyes: Negative.   Respiratory:  Negative for cough, chest tightness and shortness of breath.   Cardiovascular:  Negative for chest pain, palpitations and leg swelling.  Gastrointestinal:  Negative for abdominal distention, abdominal pain, constipation, diarrhea, nausea and vomiting.  Musculoskeletal: Negative.   Skin: Negative.   Neurological: Negative.   Psychiatric/Behavioral: Negative.     Objective:  Physical Exam Constitutional:      Appearance: She is well-developed.  HENT:     Head: Normocephalic and atraumatic.  Cardiovascular:     Rate and Rhythm: Normal rate and regular rhythm.  Pulmonary:     Effort: Pulmonary effort is normal. No respiratory distress.     Breath sounds: Normal breath sounds. No wheezing or rales.  Abdominal:     General: Bowel sounds are normal. There is no distension.     Palpations: Abdomen is soft.     Tenderness: There is no abdominal tenderness. There is no rebound.  Musculoskeletal:     Cervical back: Normal range of motion.  Skin:    General: Skin is warm and dry.  Neurological:     Mental Status: She is alert and oriented to person, place, and time.     Coordination: Coordination normal.    Vitals:   05/04/21 0938  BP: 132/68  Pulse: 70  Resp: 18   SpO2: 94%  Weight: 130 lb 12.8 oz (59.3 kg)  Height: 5\' 2"  (1.575 m)    This visit occurred during the SARS-CoV-2 public health emergency.  Safety protocols were in place, including screening questions prior to the visit, additional usage of staff PPE, and extensive cleaning of exam room while observing appropriate contact time as indicated for disinfecting solutions.   Assessment & Plan:

## 2021-05-04 NOTE — Assessment & Plan Note (Signed)
Resolved at this time. Checking CMP for sodium level.

## 2021-05-04 NOTE — Assessment & Plan Note (Signed)
Taking bactrim for ppx given immunocompromised state. No flare of lupus currently unless lung findings represent pneumonitis.

## 2021-05-04 NOTE — Assessment & Plan Note (Signed)
Checking CMP and has had now two hospitalizations regarding low sodium levels. Is on extended prednisone taper and will need close follow up of these levels.

## 2021-05-04 NOTE — Assessment & Plan Note (Signed)
Taking extended taper of prednisone and overall breathing is doing well. Gradually increasing activity and working with home PT.

## 2021-05-04 NOTE — Assessment & Plan Note (Signed)
Finished antibiotics while in hospital and discharged on extended prednisone taper.

## 2021-05-10 ENCOUNTER — Other Ambulatory Visit: Payer: Self-pay | Admitting: Internal Medicine

## 2021-05-11 ENCOUNTER — Ambulatory Visit: Payer: PPO | Admitting: Adult Health

## 2021-05-14 ENCOUNTER — Telehealth: Payer: Self-pay

## 2021-05-14 NOTE — Telephone Encounter (Signed)
Pt is calling in requesting to be updated on when she should stop taking: sodium chloride 1 g tablet  Pt states she has been taking this medications for 3 weeks 3 times a day and it is irritating her mouth. It was prescribed by an ER provider.  Please update Pt CB [phone]

## 2021-05-14 NOTE — Telephone Encounter (Signed)
I would recommend to continue until she is off the prednisone taper then we can stop.

## 2021-05-17 ENCOUNTER — Telehealth: Payer: Self-pay | Admitting: Internal Medicine

## 2021-05-17 ENCOUNTER — Other Ambulatory Visit: Payer: Self-pay | Admitting: Emergency Medicine

## 2021-05-17 NOTE — Telephone Encounter (Signed)
See below

## 2021-05-17 NOTE — Telephone Encounter (Signed)
Nikki from [facility] calling in  Patient has gained 4lbs since yesterday.. increased swelling in both ankles.. patient has heart failure diagnoses.Marland Kitchen wants to know if provider wants to increase patient fluid meds.. no complaints of SOB or chest pains  Also requesting verbal orders for nursing to further educated patient on heart failure diagnoses  Okay to speak w/ any nurse available [phone]

## 2021-05-17 NOTE — Telephone Encounter (Signed)
Recommend double up (2 pills) lasix today, then resume 1 pill daily tomorrow. Okay for verbals.

## 2021-05-18 NOTE — Telephone Encounter (Signed)
Pt checking status of return call  Informed pt of provider's recommendations  Pt expresses understanding and agreed

## 2021-05-18 NOTE — Telephone Encounter (Signed)
Informed pt of providers recommendations  Pt expressed understanding and agreed  *see below*

## 2021-05-21 NOTE — Progress Notes (Signed)
Cardiology Office Note:    Date:  05/22/2021   ID:  Sheila Morales, DOB 04/15/1951, MRN [id number]  PCP:  Hoyt Koch, MD   [facility] Providers Cardiologist:  Elouise Munroe, MD Electrophysiologist:  [name], MD     Referring MD: Hoyt Koch, *   Chief complaint: post hospital follow-up acute on chronic HFpEF, PAF  History of Present Illness:    Sheila Morales is a 71 y.o. female with a hx of sinus node dysfunction s/p PPM, HTN, COPD, PAF, chronic HFpEF, lupus, SIADH with chronic hyponatremia, and anemia of chronic disease.   She had implantation of dual-chamber pacemaker for symptomatic bradycardia due to sinus node dysfunction on 05/26/2020.   She was admitted to [facility] 11/30-12/11/22 for E. Coli bacteremia 2/2 UTI and right-sided pyelonephritis with new onset atrial fibrillation and acute CHF requiring BiPAP and hyponatremia.  She was initiated on daily prednisone therapy for interstitial lung disease by pulmonology.  Hyponatremia was felt to be secondary to SIADH managed with fluid restrictions and salt tablets. Her echocardiogram revealed LVEF 70-75%, G2DD, mild LVH, no rwma, normal RV, mildly dilated LA, mild MR.   On 04/23/21 she presented to [facility] ED with fever, headache, myalgias, fatigue, intermittent confusion x 2 days. UA was unremarkable. Chest Xray revealed interstitial infiltrate bilaterally. CBC revealed decrease in hgb from 10.4 to 7.3 in 3 weeks. Stool was positive for occult blood. BNP 533. She was admitted for evaluation of possible pneumonia, concern for acute blood loss anemia and acute metabolic encephalopathy. Head CT revealed no acute intracranial abnormality. Confusion was felt to be multifactorial 2/2 progressive infectious process and hyponatremia. Her mental status improved and hgb stabilized around 9.5. She had mild hyperkalemia which improved with Lokelma x 1. Her heart rate remained controlled during hospitalization.    Today she is here  with her husband and states she is feeling well.  She has had a few episodes of feeling off balance when she is walking.  States it feels like a balance issue and she sometimes drifts sideways as she ambulates. She denies dizziness, lightheadedness, presyncope, syncope.  Has completed her physical therapy and continues to do the exercises that she was taught on a daily basis.  She is monitoring blood pressure and heart rate at home and reports blood pressure is consistently 120/80 mmHg or lower.  Heart rate readings at home have been 60s and 70s.  She denies palpitations, fluttering, shortness of breath and has not noted any times of irregular heart rate. She denies chest pain, fatigue, melena, hematuria, hemoptysis, diaphoresis, orthopnea, and PND.  She has trace bilateral foot edema. Admits she has a difficult time sitting still and likes to be active at home. She is elevating her legs for short periods of time during the day. Is monitoring weight on a daily basis and was advised by PCP to take extra 20 mg of Lasix on 05/17/2021 for weight increase of 4 pounds. She states her weight did come down. She reports good appetite but wants to know how long she will have to take sodium tablets. Lab work on 05/04/21 at Dr. Sharlet Salina (PCP) office with advisement to continue sodium tablets 3 times per day until she is off prednisone.  She continues to take prednisone taper currently at 10 mg. She denies specific cardiac complaint today.   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     Past Surgical History:  Procedure Laterality Date   CHOLECYSTECTOMY     CHOLECYSTECTOMY, LAPAROSCOPIC     COLONOSCOPY     ESOPHAGOGASTRODUODENOSCOPY     lesion, vulva excision     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   Procedure: PACEMAKER IMPLANT;  Surgeon: [name], MD;  Location: [facility];  Service: Cardiovascular;  Laterality: N/A;   TONSILLECTOMY     VIDEO BRONCHOSCOPY Bilateral 11/06/2017   Procedure: VIDEO BRONCHOSCOPY WITHOUT FLUORO;  Surgeon: Marshell Garfinkel, MD;  Location: [facility];  Service: Cardiopulmonary;  Laterality: Bilateral;    Current Medications: Current Meds  Medication Sig   albuterol (VENTOLIN HFA) 108 (90 Base) MCG/ACT inhaler Inhale 1-2 puffs into the lungs every 6 (six) hours as needed for wheezing or shortness of breath.   amiodarone (PACERONE) 200 MG tablet Take 1 tablet (200 mg total) by mouth 2 (two) times daily.   ANORO ELLIPTA 62.5-25 MCG/ACT AEPB INHALE 1 PUFF BY MOUTH EVERY DAY   apixaban (ELIQUIS) 5 MG TABS tablet Take 1 tablet (5 mg total) by mouth 2 (two) times daily.   benzonatate (TESSALON) 200 MG capsule Take 1 capsule (200 mg total) by mouth every 6 (six) hours as needed for cough.   calcium carbonate (OS-CAL - DOSED IN MG OF ELEMENTAL CALCIUM) 1250 (500 Ca) MG tablet Take 1 tablet by mouth every evening.   Cholecalciferol (VITAMIN D) 50 MCG (2000 UT) tablet Take 2,000 Units by mouth every evening.   diltiazem (CARDIZEM CD) 360 MG 24 hr capsule Take 1 capsule (360 mg total) by mouth daily.   folic acid (FOLVITE) 1 MG tablet Take 1 mg by mouth every evening.   furosemide (LASIX) 20 MG tablet Take 1 tablet (20 mg total) by mouth daily.   gabapentin (NEURONTIN) 300 MG capsule TAKE TWO CAPSULES BY MOUTH EVERY MORNING, TWO CAPSULES EACH AFTERNOON AND THREE CAPSULES AT BEDTIME (Patient taking differently: Take 600-900 mg by mouth See admin instructions.)   hydroxychloroquine (PLAQUENIL)  200 MG tablet Take 200 mg by mouth daily.   methotrexate 2.5 MG tablet Take 10 mg by mouth every Thursday.   metoprolol tartrate (LOPRESSOR) 25 MG tablet Take 1 tablet (25 mg total) by mouth 2 (two) times daily.   Misc Natural Products (ADVANCED JOINT RELIEF) CAPS Take 1 capsule by mouth daily. Joint Advantage Gold 5x   Multiple Vitamin (MULTIVITAMIN WITH MINERALS) TABS tablet Take 1 tablet by mouth daily.   nystatin (MYCOSTATIN) 100000 UNIT/ML suspension Take 5 mLs (500,000 Units total) by mouth in the morning and at bedtime.   predniSONE (DELTASONE) 20 MG tablet Take prednisone 40 mg daily x 14 days, then prednisone 30 mg x 14 days, then prednisone 20 mg x 14 days, then prednisone 15 mg x 14 days, then prednisone mg x 14 days, then prednisone 5 mg x 14 days.   RESTASIS 0.05 % ophthalmic emulsion Place 1 drop into both eyes 2 (two) times daily.   sodium chloride 1 g tablet Take 1 tablet (1 g total) by  mouth 3 (three) times daily with meals.   sulfamethoxazole-trimethoprim (BACTRIM) 400-80 MG tablet Take 1 tablet by mouth 3 (three) times a week.   vitamin C (ASCORBIC ACID) 500 MG tablet Take 500 mg by mouth daily.   vitamin E 180 MG (400 UNITS) capsule Take 400 Units by mouth daily.   zolpidem (AMBIEN) 5 MG tablet TAKE 1 TABLET BY MOUTH AT BEDTIME AS NEEDED FOR SLEEP.     Allergies:   Patient has no known allergies.   Social History   Socioeconomic History   Marital status: Married    Spouse name: Not on file   Number of children: 2   Years of education: 12   Highest education level: Not on file  Occupational History   Occupation: Education administrator business   Occupation: caregiver  Tobacco Use   Smoking status: Former    Packs/day: 0.25    Years: 30.00    Pack years: 7.50    Types: Cigarettes    Quit date: 01/21/2020    Years since quitting: 1.3   Smokeless tobacco: Never   Tobacco comments:    smokes "every now and then" 08/24/20 [facility]   Vaping Use   Vaping Use: Never used  Substance and  Sexual Activity   Alcohol use: Yes    Alcohol/week: 5.0 standard drinks    Types: 5 Standard drinks or equivalent per week    Comment: 4 out of 7 dyas per husband at bedside   Drug use: No   Sexual activity: Yes  Other Topics Concern   Not on file  Social History Narrative   HSG. Married '71. 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       One level home with spouse   Right handed   Caffeine - coffee 2-3 cups am   Exercise - treadmill    Education - 12th grade   Social Determinants of Health   Financial Resource Strain: Not on file  Food Insecurity: Not on file  Transportation Needs: Not on file  Physical Activity: Not on file  Stress: Not on file  Social Connections: Not on file     Family History: The patient's family history includes Atrial fibrillation in her father; Colon cancer in her father; Healthy in her daughter; Other in her sister; Stroke in her mother.  ROS:   Please see the history of present illness.  +trace bilateral foot edema All other systems reviewed and are negative.  Labs/Other Studies Reviewed:    The following studies were reviewed today:  Echo 03/22/21  Left Ventricle: Left ventricular ejection fraction, by estimation, is 70  to 75%. The left ventricle has hyperdynamic function. The left ventricle  has no regional wall motion abnormalities. The left ventricular internal  cavity size was normal in size. There is mild left ventricular hypertrophy. Left ventricular diastolic  parameters are consistent with Grade II diastolic dysfunction  (pseudonormalization). Elevated left atrial pressure.  Right Ventricle: The right ventricular size is normal. Right ventricular  systolic function is normal. There is mildly elevated pulmonary artery  systolic pressure. The tricuspid regurgitant velocity is 3.18 m/s, and  with an assumed right atrial pressure  of 3 mmHg, the  estimated right ventricular systolic pressure is 41.9 mmHg.  Left Atrium: Left atrial size was mildly dilated.  Right Atrium: Right atrial size was normal in size.  Pericardium: There is no evidence of pericardial effusion.  Mitral Valve: The mitral valve  is normal in structure. Mild mitral annular  calcification. Mild mitral valve regurgitation. No evidence of mitral  valve stenosis. MV peak gradient, 12.0 mmHg. The mean mitral valve  gradient is 3.0 mmHg.  Tricuspid Valve: The tricuspid valve is normal in structure. Tricuspid  valve regurgitation is mild . No evidence of tricuspid stenosis.  Aortic Valve: The aortic valve is tricuspid. Aortic valve regurgitation is  not visualized. Aortic valve sclerosis/calcification is present, without  any evidence of aortic stenosis.  Pulmonic Valve: The pulmonic valve was normal in structure. Pulmonic valve  regurgitation is trivial. No evidence of pulmonic stenosis.  Aorta: The aortic root is normal in size and structure.  Venous: The inferior vena cava is normal in size with greater than 50%  respiratory variability, suggesting right atrial pressure of 3 mmHg.  IAS/Shunts: No atrial level shunt detected by color flow Doppler.   CT Chest 04/24/21  IMPRESSION: 1. Regressed but not resolved confluent bilateral upper lobe predominant lung opacity, mostly ground-glass. Favor regressed multifocal pneumonia. Underlying chronic lung disease including Emphysema (ICD10-J43.9). Decreased pleural effusions, trace residual. Reactive appearing mediastinal and hilar lymph nodes. 2. Persistent asymmetric right pararenal space fluid and/or inflammation but no CT evidence of pyelonephritis or obstructive uropathy. 3. No other acute or inflammatory process identified in the chest, abdomen, or pelvis. Aortic Atherosclerosis (ICD10-I70.0).  Recent Labs: 03/21/2021: Pro B Natriuretic peptide (BNP) 1,279.0 04/23/2021: B Natriuretic Peptide 533.7; TSH  1.577 04/25/2021: Magnesium 2.2 05/04/2021: ALT 44; BUN 30; Creatinine, Ser 1.25; Hemoglobin 10.4; Platelets 426.0; Potassium 4.2; Sodium 130  Recent Lipid Panel    Component Value Date/Time   CHOL 148 03/29/2019 0909   TRIG 66.0 03/29/2019 0909   HDL 63.60 03/29/2019 0909   CHOLHDL 2 03/29/2019 0909   VLDL 13.2 03/29/2019 0909   LDLCALC 71 03/29/2019 0909   LDLDIRECT 52.0 02/24/2017 1351     Risk Assessment/Calculations:    CHA2DS2-VASc Score = 4  This indicates a 4.8% annual risk of stroke. The patient's score is based upon: CHF History: 1 HTN History: 1 Diabetes History: 0 Stroke History: 0 Vascular Disease History: 0 Age Score: 1 Gender Score: 1    Physical Exam:    VS:  BP (!) 106/56    Pulse 81    Ht 5\' 2"  (1.575 m)    Wt 135 lb 6.4 oz (61.4 kg)    SpO2 94%    BMI 24.76 kg/m     Wt Readings from Last 3 Encounters:  05/22/21 135 lb 6.4 oz (61.4 kg)  05/04/21 130 lb 12.8 oz (59.3 kg)  04/23/21 135 lb 9.3 oz (61.5 kg)     GEN:  Well nourished, well developed in no acute distress HEENT: Normal NECK: No JVD; No carotid bruits CARDIAC: RRR, no murmurs, rubs, gallops RESPIRATORY:  Clear to auscultation without rales, wheezing or rhonchi  ABDOMEN: Soft, non-tender, non-distended MUSCULOSKELETAL:  Trace edema bilateral feet. No deformity. 2+ pedal pulses, equal bilaterally SKIN: Warm and dry NEUROLOGIC:  Alert and oriented x 3 PSYCHIATRIC:  Normal affect   EKG:  EKG is not ordered today.  Diagnoses:    1. Medication management   2. Sinus node dysfunction (HCC)   3. Pacemaker   4. PAF (paroxysmal atrial fibrillation) ([place])   5. Chronic diastolic CHF (congestive heart failure) ([place])   6. Essential hypertension   7. SIADH (syndrome of inappropriate ADH production) ([place])   8. Hyponatremia   9. Chronic anticoagulation    Assessment and Plan:  PAF on chronic anticoagulation: Heart rate sounds regular by auscultation and feels regular on exam. She denies  palpitations, fluttering, or other symptoms concerning for atrial fibrillation. She denies fast HR at home. She denies bleeding concerns on Eliquis. Hemoglobin decreased significantly during hospitalization from 10.4 on 03/29/21 to 7.3 on 04/23/21. Hgb on 05/04/21 is 10.4. Has previously had low hgb in 2019 (9.7-11.6). She was started on Eliquis during hospitalization in December 2022, however hgb was decreased for a time prior to initiating DOAC. If hgb drops again, would likely seek guidance from hematology. Will recheck today. Continue diltiazem, metoprolol, amiodarone, Eliquis.   Sinus node dysfunction s/p PPM implantation: Normal device function per last remote device check on 02/21/21. She denies dizziness, lightheadedness, presyncope or syncope. She needs follow-up appointment with EP team within the next 4 months. Management per EP.   Chronic HFpEF: LVEF 70-75%, G2DD, mild LVH by echo 03/22/21. Reports 4 lb increase in weight on 05/17/21 with advice to take additional furosemide per PCP. She reports weight returned to normal following extra dose of furosemide. Has trace bilateral foot edema, no orthopnea, PND, dyspnea. Continue daily weights, sodium restriction. She admits she goes over her fluid restriction "a little" for hyponatremia, encouraged compliance. Continue metoprolol, furosemide.   Essential hypertension: BP is well-controlled. Home BP readings consistently at goal. Continue diltiazem, metoprolol.   SIADH with chronic hyponatremia: On fluid restriction for chronic hyponatremia. Currently taking 1 G salt tablets 3 times per day, previously advised by PCP to continue until she is off prednisone taper. Will recheck basic metabolic panel today.    Disposition: 2 months with Dr. [name] or APP and 4 months with Dr. Lovena Le or APP   Medication Adjustments/Labs and Tests Ordered: Current medicines are reviewed at length with the patient today.  Concerns regarding medicines are outlined above.   Orders Placed This Encounter  Procedures   Basic metabolic panel   CBC   No orders of the defined types were placed in this encounter.   Patient Instructions  Medication Instructions:  Your physician recommends that you continue on your current medications as directed. Please refer to the Current Medication list given to you today.  *If you need a refill on your cardiac medications before your next appointment, please call your pharmacy*   Lab Work: TODAY - BMET, CBC ( 3rd floor)   If you have labs (blood work) drawn today and your tests are completely normal, you will receive your results only by: [place] (if you have MyChart) OR A paper copy in the mail If you have any lab test that is abnormal or we need to change your treatment, we will call you to review the results.   Testing/Procedures: None Ordered   Follow-Up: At [facility], you and your health needs are our priority.  As part of our continuing mission to provide you with exceptional heart care, we have created designated Provider Care Teams.  These Care Teams include your primary Cardiologist (physician) and Advanced Practice Providers (APPs -  Physician Assistants and Nurse Practitioners) who all work together to provide you with the care you need, when you need it.  We recommend signing up for the patient portal called "MyChart".  Sign up information is provided on this After Visit Summary.  MyChart is used to connect with patients for Virtual Visits (Telemedicine).  Patients are able to view lab/test results, encounter notes, upcoming appointments, etc.  Non-urgent messages can be sent to your provider as well.   To  learn more about what you can do with MyChart, go to [email or web address].    Your next appointment:   2 month(s)  The format for your next appointment:   In Person  Provider:   Elouise Munroe, MD or APP at [facility] [place] on same day Dr. [name] is in the office  Your physician recommends  that you schedule a follow-up appointment in: 4 months with Dr. Lovena Le or EP APP   If primary card or EP is not listed click here to update    :1}     Signed, Emmaline Life, NP  05/22/2021 11:06 AM    [place]

## 2021-05-22 ENCOUNTER — Ambulatory Visit (INDEPENDENT_AMBULATORY_CARE_PROVIDER_SITE_OTHER): Payer: PPO | Admitting: Pulmonary Disease

## 2021-05-22 ENCOUNTER — Other Ambulatory Visit: Payer: Self-pay

## 2021-05-22 ENCOUNTER — Ambulatory Visit (INDEPENDENT_AMBULATORY_CARE_PROVIDER_SITE_OTHER): Payer: PPO

## 2021-05-22 ENCOUNTER — Encounter (HOSPITAL_BASED_OUTPATIENT_CLINIC_OR_DEPARTMENT_OTHER): Payer: Self-pay | Admitting: Nurse Practitioner

## 2021-05-22 ENCOUNTER — Encounter: Payer: Self-pay | Admitting: Pulmonary Disease

## 2021-05-22 ENCOUNTER — Ambulatory Visit (HOSPITAL_BASED_OUTPATIENT_CLINIC_OR_DEPARTMENT_OTHER): Payer: PPO | Admitting: Nurse Practitioner

## 2021-05-22 VITALS — BP 106/56 | HR 81 | Ht 62.0 in | Wt 135.4 lb

## 2021-05-22 VITALS — BP 118/76 | HR 74 | Temp 98.5°F | Ht 62.0 in | Wt 135.4 lb

## 2021-05-22 DIAGNOSIS — J8281 Chronic eosinophilic pneumonia: Secondary | ICD-10-CM

## 2021-05-22 DIAGNOSIS — J189 Pneumonia, unspecified organism: Secondary | ICD-10-CM | POA: Diagnosis not present

## 2021-05-22 DIAGNOSIS — I10 Essential (primary) hypertension: Secondary | ICD-10-CM | POA: Diagnosis not present

## 2021-05-22 DIAGNOSIS — E871 Hypo-osmolality and hyponatremia: Secondary | ICD-10-CM

## 2021-05-22 DIAGNOSIS — I495 Sick sinus syndrome: Secondary | ICD-10-CM | POA: Diagnosis not present

## 2021-05-22 DIAGNOSIS — Z7901 Long term (current) use of anticoagulants: Secondary | ICD-10-CM | POA: Diagnosis not present

## 2021-05-22 DIAGNOSIS — E222 Syndrome of inappropriate secretion of antidiuretic hormone: Secondary | ICD-10-CM

## 2021-05-22 DIAGNOSIS — I5032 Chronic diastolic (congestive) heart failure: Secondary | ICD-10-CM

## 2021-05-22 DIAGNOSIS — I480 Paroxysmal atrial fibrillation: Secondary | ICD-10-CM

## 2021-05-22 DIAGNOSIS — Z950 Presence of cardiac pacemaker: Secondary | ICD-10-CM | POA: Diagnosis not present

## 2021-05-22 DIAGNOSIS — J439 Emphysema, unspecified: Secondary | ICD-10-CM | POA: Diagnosis not present

## 2021-05-22 DIAGNOSIS — Z79899 Other long term (current) drug therapy: Secondary | ICD-10-CM | POA: Diagnosis not present

## 2021-05-22 NOTE — Patient Instructions (Addendum)
Medication Instructions:  Your physician recommends that you continue on your current medications as directed. Please refer to the Current Medication list given to you today.  *If you need a refill on your cardiac medications before your next appointment, please call your pharmacy*   Lab Work: TODAY - BMET, CBC ( 3rd floor)   If you have labs (blood work) drawn today and your tests are completely normal, you will receive your results only by: [place] (if you have MyChart) OR A paper copy in the mail If you have any lab test that is abnormal or we need to change your treatment, we will call you to review the results.   Testing/Procedures: None Ordered   Follow-Up: At [facility], you and your health needs are our priority.  As part of our continuing mission to provide you with exceptional heart care, we have created designated Provider Care Teams.  These Care Teams include your primary Cardiologist (physician) and Advanced Practice Providers (APPs -  Physician Assistants and Nurse Practitioners) who all work together to provide you with the care you need, when you need it.  We recommend signing up for the patient portal called "MyChart".  Sign up information is provided on this After Visit Summary.  MyChart is used to connect with patients for Virtual Visits (Telemedicine).  Patients are able to view lab/test results, encounter notes, upcoming appointments, etc.  Non-urgent messages can be sent to your provider as well.   To learn more about what you can do with MyChart, go to [email or web address].    Your next appointment:   2 month(s)  The format for your next appointment:   In Person  Provider:   Elouise Munroe, MD or APP at [facility] on same day Dr. [name] is in the office  Your physician recommends that you schedule a follow-up appointment in: 4 months with Dr. Lovena Le or EP APP   If primary card or EP is not listed click here to update    :1}

## 2021-05-22 NOTE — Patient Instructions (Signed)
Nice to see you again  Continue decreasing the prednisone as previously planned, continue taking the Bactrim as you are doing  We will get a chest x-ray today  I will send Dr. Sharlet Salina a message and ask about salt pills and managing the low sodium.  Return to clinic in 6 weeks or sooner as needed, I know this is a quick follow-up but just 1 to make sure you continue to improve as we decrease the prednisone

## 2021-05-22 NOTE — Progress Notes (Signed)
@Patient  ID: Sheila Morales, female    DOB: 1950-05-01, 71 y.o.   MRN: [id number]  Chief Complaint  Patient presents with   Hospitalization [facility] follow up from a few weeks ago. Hemoglobin dropped really fast in the hospital per husband. Saw MH in the hospital. States MH had issues with the CT scan     Referring provider: Hoyt Koch, *  HPI:   70 y.o. 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  Discharge summary reviewed.  Briefly, history of infiltrates in the past with area of posterior right upper lobe scarring.  This predated her amiodarone.  Also on methotrexate.  However, was hospitalized in late 2022 with volume overload and groundglass opacities favored to be most likely related to pulmonary edema.  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  She was placed on prednisone taper at discharge.  At the end of her prednisone, she developed dyspnea.  This worsened over the course of a few days.  Developed fever, came to hospital.  Was found to be hypoxemic.  Coarse interstitial infiltrates on chest x-ray on my review and interpretation.  CT chest showed worsening area of fibrotic changes in the posterior right upper lobe as well as scattered groundglass.  At time of evaluation he is on 5 L nasal cannula with accessory muscle use.  Felt too ill to safely tolerate bronchoscopy.  Recommend ongoing diuresis but started prednisone half a milligram per kilogram, 40 mg daily.  After 1 dose her fever subsided, her hypoxemia resolved, her dyspnea exertion went away.  She had peripheral eosinophilia 400-500.  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   Questionaires / Pulmonary Flowsheets:   ACT:   No flowsheet data found.  MMRC: No flowsheet data found.  Epworth:  No flowsheet data found.  Tests:   FENO:  No results found for: NITRICOXIDE  PFT: PFT Results Latest Ref Rng & Units 02/06/2017  FVC-Pre L 2.14  FVC-Predicted Pre % 72  FVC-Post L 2.22  FVC-Predicted Post % 74  Pre FEV1/FVC % % 60  Post FEV1/FCV % % 59  FEV1-Pre L 1.28  FEV1-Predicted Pre % 56  FEV1-Post L 1.30  DLCO uncorrected ml/min/mmHg 12.45  DLCO UNC% % 54  DLCO corrected ml/min/mmHg 12.34  DLCO COR %Predicted % 53  DLVA Predicted % 66  TLC L 4.65  TLC % Predicted % 94  RV % Predicted % 107  Personally reviewed and interpreted 2018 PFTs where spirometry is suggestive of moderate obstruction, no bronchodilator response, TLC within normal limits, DLCO moderately reduced  WALK:  No flowsheet data found.  Imaging: Personally reviewed and as per EMR discussed in this note CT Head Wo Contrast  Result Date: 04/23/2021 CLINICAL DATA:  Headache and fatigue.  Fever for 2 days. EXAM: CT HEAD WITHOUT CONTRAST TECHNIQUE: Contiguous axial images were obtained from the base of the skull through the vertex without intravenous contrast. COMPARISON:  None. FINDINGS: Brain: No evidence of acute infarction, hemorrhage, hydrocephalus, extra-axial collection or mass lesion/mass effect. Patchy areas of white matter hypoattenuation are noted consistent with mild chronic microvascular ischemic change. Vascular: No hyperdense vessel or unexpected calcification. Skull: Normal. Negative for fracture or focal lesion. Sinuses/Orbits: Visualized globes and orbits are unremarkable. Visualized  sinuses are clear. Other: None. IMPRESSION: 1. No acute intracranial abnormalities. Electronically Signed   By: Lajean Manes M.D.   On: 04/23/2021 11:38   CT CHEST ABDOMEN PELVIS W CONTRAST  Result Date: 04/24/2021 CLINICAL DATA:  71 year old female with sepsis, recurrent fever, immunocompromised. EXAM: CT CHEST, ABDOMEN, AND PELVIS WITH  CONTRAST TECHNIQUE: Multidetector CT imaging of the chest, abdomen and pelvis was performed following the standard protocol during bolus administration of intravenous contrast. CONTRAST:  91mL OMNIPAQUE IOHEXOL 350 MG/ML SOLN COMPARISON:  Portable chest 04/23/2021. CT Chest, Abdomen, and Pelvis 03/26/2021. FINDINGS: CT CHEST FINDINGS Cardiovascular: Left chest transvenous pacemaker device. Calcified coronary artery atherosclerosis. Calcified aortic atherosclerosis. Left atrial enlargement suspected but overall cardiac size within normal limits. No pericardial effusion. Major mediastinal vascular structures appear to be patent. Mediastinum/Nodes: Reactive appearing mediastinal and hilar lymph nodes, individually up to 13 mm short axis anterior to the carina. Lungs/Pleura: Bilateral pleural effusions have regressed, trace residual pleural fluid now. Major airways are patent and lung volumes are larger. Regressed but not resolved bilateral upper lobe pulmonary opacity, ground-glass type and confluent now greater on the right. Superimposed emphysema and lung scarring including some areas of early subpleural honeycombing. Improved bibasilar ventilation with patchy residual lower lobe peribronchial opacity. Musculoskeletal: Chronic left anterior rib fractures. No acute or suspicious osseous abnormality identified. CT ABDOMEN PELVIS FINDINGS Hepatobiliary: Absent gallbladder. Liver and bile ducts within normal limits. Pancreas: Negative. Spleen: Negative. Adrenals/Urinary Tract: Normal adrenal glands. Nonobstructed kidneys with symmetric renal enhancement and contrast excretion although there is on going asymmetric right pararenal space inflammation and/or fluid with simple fluid density (series 5, image 58). But no evidence of acute pyelonephritis. And ureters appear to remain normal. Larger but unremarkable bladder. Stomach/Bowel: Retained stool and diverticulosis of the large bowel in the descending and sigmoid  segments. No active inflammation. Redundant transverse colon with similar retained stool. Negative right colon with some residual oral contrast suspected. Fluid in the terminal ileum which is within normal limits. Normal appendix visible on coronal image 57. No dilated small bowel. Decompressed stomach and duodenum. No free air, free fluid, or mesenteric inflammation. Vascular/Lymphatic: Extensive Aortoiliac calcified atherosclerosis. Major arterial structures remain patent. Portal venous system appears to be patent. No lymphadenopathy identified. Reproductive: Negative. Other: No pelvic free fluid. Musculoskeletal: Lumbar disc and endplate degeneration associated with mild levoconvex scoliosis. No acute or suspicious osseous process identified. IMPRESSION: 1. Regressed but not resolved confluent bilateral upper lobe predominant lung opacity, mostly ground-glass. Favor regressed multifocal pneumonia. Underlying chronic lung disease including Emphysema (ICD10-J43.9). Decreased pleural effusions, trace residual. Reactive appearing mediastinal and hilar lymph nodes. 2. Persistent asymmetric right pararenal space fluid and/or inflammation but no CT evidence of pyelonephritis or obstructive uropathy. 3. No other acute or inflammatory process identified in the chest, abdomen, or pelvis. Aortic Atherosclerosis (ICD10-I70.0). Electronically Signed   By: Genevie Ann M.D.   On: 04/24/2021 09:35   DG Chest Portable 1 View  Result Date: 04/23/2021 CLINICAL DATA:  Fever for 2 days.  Headache and fatigue. EXAM: PORTABLE CHEST 1 VIEW COMPARISON:  03/30/2021. FINDINGS: Cardiac silhouette is mildly enlarged. Stable left anterior chest wall sequential pacemaker. No mediastinal or hilar masses. Bilateral interstitial type opacities. Subtle superimposed airspace opacity in the mid lungs. Additional lung base opacities consistent with atelectasis, with small effusion suspected. No pneumothorax. Skeletal structures grossly intact.  IMPRESSION: 1. Interstitial with subtle hazy airspace opacities, similar to the prior chest radiograph. Residual or recurrent multifocal infection suspected. 2. Cardiomegaly.  Small effusions. Electronically  Signed   By: Lajean Manes M.D.   On: 04/23/2021 11:34    Lab Results: Personally reviewed CBC    Component Value Date/Time   WBC 12.1 (H) 05/04/2021 1007   RBC 3.31 (L) 05/04/2021 1007   HGB 10.4 (L) 05/04/2021 1007   HGB 11.8 05/11/2020 1203   HGB 13.1 01/13/2009 1108   HCT 31.5 (L) 05/04/2021 1007   HCT 35.0 05/11/2020 1203   HCT 37.5 01/13/2009 1108   PLT 426.0 (H) 05/04/2021 1007   PLT 153 05/11/2020 1203   MCV 95.1 05/04/2021 1007   MCV 92 05/11/2020 1203   MCV 86.2 01/13/2009 1108   MCH 31.5 04/25/2021 1246   MCHC 33.2 05/04/2021 1007   RDW 15.9 (H) 05/04/2021 1007   RDW 14.7 05/11/2020 1203   RDW 12.2 01/13/2009 1108   LYMPHSABS 0.6 (L) 04/24/2021 0507   LYMPHSABS 1.2 05/11/2020 1203   LYMPHSABS 2.2 01/13/2009 1108   MONOABS 0.7 04/24/2021 0507   MONOABS 0.7 01/13/2009 1108   EOSABS 0.4 04/24/2021 0507   EOSABS 0.1 05/11/2020 1203   BASOSABS 0.0 04/24/2021 0507   BASOSABS 0.0 05/11/2020 1203   BASOSABS 0.0 01/13/2009 1108    BMET    Component Value Date/Time   NA 130 (L) 05/04/2021 1007   NA 126 (L) 04/09/2021 0000   K 4.2 05/04/2021 1007   CL 95 (L) 05/04/2021 1007   CO2 27 05/04/2021 1007   GLUCOSE 85 05/04/2021 1007   BUN 30 (H) 05/04/2021 1007   BUN 25 04/09/2021 0000   CREATININE 1.25 (H) 05/04/2021 1007   CREATININE 0.91 11/22/2019 1502   CALCIUM 9.6 05/04/2021 1007   GFRNONAA 57 (L) 04/26/2021 0407   GFRAA 89 05/11/2020 1203    BNP    Component Value Date/Time   BNP 533.7 (H) 04/23/2021 1041    ProBNP    Component Value Date/Time   PROBNP 1,279.0 (H) 03/21/2021 1206    Specialty Problems       Pulmonary Problems   Cough    Qualifier: Diagnosis of  By: Asa Lente MD, Jannifer Rodney       Smokers' cough ([place])   COPD (chronic  obstructive pulmonary disease) ([place])   Acute respiratory failure with hypoxia and hypercapnia ([place])   Community acquired pneumonia   Interstitial lung disease ([place])    No Known Allergies  Immunization History  Administered Date(s) Administered   Fluad Quad(high Dose 65+) 03/29/2019   H1N1 05/31/2008   Influenza Split 02/13/2012   Influenza Whole 01/21/2008, 01/17/2009   Influenza, High Dose Seasonal PF 02/19/2016, 02/05/2018   Influenza,inj,Quad PF,6+ Mos 01/21/2014   Influenza-Unspecified 02/02/2015, 02/03/2017   Pneumococcal Conjugate-13 02/19/2016   Pneumococcal Polysaccharide-23 02/24/2017   Tdap 03/29/2019    Past Medical History:  Diagnosis Date   Acute respiratory failure with hypoxia ([place]) 11/05/2017   Arthritis    Colitis, ischemic (HCC)    COPD (chronic obstructive pulmonary disease) ([place])    External hemorrhoids    H/O: rheumatic fever    IBS (irritable bowel syndrome)    Personal history colonic adenoma 03/25/2008   06/2007 right sided adenoma and 2 right hyperplastic polyps     Restless leg syndrome    questionable   Systemic lupus erythematosus (HCC)    Tubular adenoma of colon    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     Tobacco History: Social History   Tobacco Use  Smoking Status Former   Packs/day: 0.25   Years: 30.00   Pack  years: 7.50   Types: Cigarettes   Quit date: 01/21/2020   Years since quitting: 1.3  Smokeless Tobacco Never  Tobacco Comments   smokes "every now and then" 08/24/20 [facility]    Counseling given: Not Answered Tobacco comments: smokes "every now and then" 08/24/20 [facility]    Continue to not smoke  Outpatient Encounter Medications as of 05/22/2021  Medication Sig   albuterol (VENTOLIN HFA) 108 (90 Base) MCG/ACT inhaler Inhale 1-2 puffs into the lungs every 6 (six) hours as needed for wheezing or shortness of breath.   amiodarone (PACERONE) 200 MG tablet Take 1 tablet (200 mg total) by mouth 2 (two) times daily.   ANORO ELLIPTA  62.5-25 MCG/ACT AEPB INHALE 1 PUFF BY MOUTH EVERY DAY   apixaban (ELIQUIS) 5 MG TABS tablet Take 1 tablet (5 mg total) by mouth 2 (two) times daily.   benzonatate (TESSALON) 200 MG capsule Take 1 capsule (200 mg total) by mouth every 6 (six) hours as needed for cough.   calcium carbonate (OS-CAL - DOSED IN MG OF ELEMENTAL CALCIUM) 1250 (500 Ca) MG tablet Take 1 tablet by mouth every evening.   Cholecalciferol (VITAMIN D) 50 MCG (2000 UT) tablet Take 2,000 Units by mouth every evening.   diltiazem (CARDIZEM CD) 360 MG 24 hr capsule Take 1 capsule (360 mg total) by mouth daily.   folic acid (FOLVITE) 1 MG tablet Take 1 mg by mouth every evening.   furosemide (LASIX) 20 MG tablet Take 1 tablet (20 mg total) by mouth daily.   gabapentin (NEURONTIN) 300 MG capsule TAKE TWO CAPSULES BY MOUTH EVERY MORNING, TWO CAPSULES EACH AFTERNOON AND THREE CAPSULES AT BEDTIME (Patient taking differently: Take 600-900 mg by mouth See admin instructions.)   hydroxychloroquine (PLAQUENIL) 200 MG tablet Take 200 mg by mouth daily.   methotrexate 2.5 MG tablet Take 10 mg by mouth every Thursday.   metoprolol tartrate (LOPRESSOR) 25 MG tablet Take 1 tablet (25 mg total) by mouth 2 (two) times daily.   Misc Natural Products (ADVANCED JOINT RELIEF) CAPS Take 1 capsule by mouth daily. Joint Advantage Gold 5x   Multiple Vitamin (MULTIVITAMIN WITH MINERALS) TABS tablet Take 1 tablet by mouth daily.   nystatin (MYCOSTATIN) 100000 UNIT/ML suspension Take 5 mLs (500,000 Units total) by mouth in the morning and at bedtime.   predniSONE (DELTASONE) 20 MG tablet Take prednisone 40 mg daily x 14 days, then prednisone 30 mg x 14 days, then prednisone 20 mg x 14 days, then prednisone 15 mg x 14 days, then prednisone mg x 14 days, then prednisone 5 mg x 14 days.   RESTASIS 0.05 % ophthalmic emulsion Place 1 drop into both eyes 2 (two) times daily.   sodium chloride 1 g tablet Take 1 tablet (1 g total) by mouth 3 (three) times daily with  meals.   sulfamethoxazole-trimethoprim (BACTRIM) 400-80 MG tablet Take 1 tablet by mouth 3 (three) times a week.   vitamin C (ASCORBIC ACID) 500 MG tablet Take 500 mg by mouth daily.   vitamin E 180 MG (400 UNITS) capsule Take 400 Units by mouth daily.   zolpidem (AMBIEN) 5 MG tablet TAKE 1 TABLET BY MOUTH AT BEDTIME AS NEEDED FOR SLEEP.   No facility-administered encounter medications on file as of 05/22/2021.     Review of Systems  Review of Systems  N/a Physical Exam  BP 118/76 (BP Location: Left Arm, Patient Position: Sitting, Cuff Size: Normal)    Pulse 74    Temp 98.5 F (  36.9 C) (Oral)    Ht 5\' 2"  (1.575 m)    Wt 135 lb 6.4 oz (61.4 kg)    SpO2 97%    BMI 24.76 kg/m   Wt Readings from Last 5 Encounters:  05/22/21 135 lb 6.4 oz (61.4 kg)  05/22/21 135 lb 6.4 oz (61.4 kg)  05/04/21 130 lb 12.8 oz (59.3 kg)  04/23/21 135 lb 9.3 oz (61.5 kg)  04/14/21 122 lb (55.3 kg)    BMI Readings from Last 5 Encounters:  05/22/21 24.76 kg/m  05/22/21 24.76 kg/m  05/04/21 23.92 kg/m  04/23/21 24.80 kg/m  04/14/21 22.31 kg/m     Physical Exam    Assessment & Plan:   Acute hypoxemic respite failure with progressive chronic pulmonary infiltrates with worsened fever and cough: Concerning for underlying inflammatory lung disease, favor NSIP given pattern and groundglass opacities on imaging on admission.  Suspect prior infiltrates 03/2021 reflective of this process as well as volume overload given bilateral pleural effusions now improved.  Small area of what looks like fibrosis in the right upper lobe in 03/21/2021.  Overall pattern improved from last admission but with progressive fibrosis particular in the right upper lobe as well as bronchiectasis and bilateral but right greater than left lower lobes.  No clear signs of pulmonary infection on CT scan on my interpretation.  Fear this is early fibrosis and signs of volume loss.  She has fever, productive cough worsening hypoxemia in  the setting of steroid taper.  Procalcitonin is negative.  The differential also includes methotrexate lung toxicity although the pattern does not appear classic for this.  In addition, amiodarone toxicity is possible but given initial area of fibrosis predates amiodarone and the relative short duration of amiodarone administration this is felt unlikely.  She had peripheral eosinophilia of 400 on admission.  Marked improvement and essentially resolution of fever, hypoxemia, dyspnea, cough after 1 dose of prednisone 40 mg daily.  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  --continue prednisone taper soon down to prednisone 20 mg x 14 days, 15 mg x 14 days, 10 mg x 14 days, 5 mg x 14 days --Consider  bronchoscopy in future if mild symptoms returned an effort to get cell count to evaluate for eosinophilia --Bactrim thrice weekly for PJP prophylaxis given steroids added to already immunosuppression regimen of methotrexate and Plaquenil, need to continue as long as on prednisone   Return in about 6 weeks (around 07/03/2021).   Lanier Clam, MD 05/22/2021   This appointment required 41 minutes of patient care (this includes precharting, chart review, review of results, face-to-face care, etc.).

## 2021-05-23 ENCOUNTER — Encounter: Payer: Self-pay | Admitting: Internal Medicine

## 2021-05-23 DIAGNOSIS — Z950 Presence of cardiac pacemaker: Secondary | ICD-10-CM | POA: Diagnosis not present

## 2021-05-23 DIAGNOSIS — G9341 Metabolic encephalopathy: Secondary | ICD-10-CM | POA: Diagnosis not present

## 2021-05-23 DIAGNOSIS — J449 Chronic obstructive pulmonary disease, unspecified: Secondary | ICD-10-CM | POA: Diagnosis not present

## 2021-05-23 DIAGNOSIS — R7881 Bacteremia: Secondary | ICD-10-CM | POA: Diagnosis not present

## 2021-05-23 DIAGNOSIS — M329 Systemic lupus erythematosus, unspecified: Secondary | ICD-10-CM | POA: Diagnosis not present

## 2021-05-23 DIAGNOSIS — I4891 Unspecified atrial fibrillation: Secondary | ICD-10-CM | POA: Diagnosis not present

## 2021-05-23 DIAGNOSIS — I10 Essential (primary) hypertension: Secondary | ICD-10-CM | POA: Diagnosis not present

## 2021-05-23 DIAGNOSIS — Z7901 Long term (current) use of anticoagulants: Secondary | ICD-10-CM | POA: Diagnosis not present

## 2021-05-23 DIAGNOSIS — I5033 Acute on chronic diastolic (congestive) heart failure: Secondary | ICD-10-CM | POA: Diagnosis not present

## 2021-05-23 DIAGNOSIS — Z7982 Long term (current) use of aspirin: Secondary | ICD-10-CM | POA: Diagnosis not present

## 2021-05-23 LAB — BASIC METABOLIC PANEL
BUN/Creatinine Ratio: 19 (ref 12–28)
BUN: 22 mg/dL (ref 8–27)
CO2: 24 mmol/L (ref 20–29)
Calcium: 9.4 mg/dL (ref 8.7–10.3)
Chloride: 96 mmol/L (ref 96–106)
Creatinine, Ser: 1.18 mg/dL — ABNORMAL HIGH (ref 0.57–1.00)
Glucose: 86 mg/dL (ref 70–99)
Potassium: 4.5 mmol/L (ref 3.5–5.2)
Sodium: 136 mmol/L (ref 134–144)
eGFR: 49 mL/min/{1.73_m2} — ABNORMAL LOW (ref >59)

## 2021-05-23 LAB — CBC
Hematocrit: 35.3 % (ref 34.0–46.6)
Hemoglobin: 11.9 g/dL (ref 11.1–15.9)
MCH: 31.2 pg (ref 26.6–33.0)
MCHC: 33.7 g/dL (ref 31.5–35.7)
MCV: 92 fL (ref 79–97)
Platelets: 194 10*3/uL (ref 150–450)
RBC: 3.82 x10E6/uL (ref 3.77–5.28)
RDW: 14.5 % (ref 11.7–15.4)
WBC: 10.7 10*3/uL (ref 3.4–10.8)

## 2021-05-24 NOTE — Telephone Encounter (Signed)
See below

## 2021-05-24 NOTE — Telephone Encounter (Signed)
Patient calling in  Patient says she has been taking the nystatin (MYCOSTATIN) 100000 UNIT/ML suspension as prescribed by provider but it is not working   Says she is still experiencing the irritating teeth & gums.. wants to know what provider recommends   Please call [phone]

## 2021-05-27 LAB — CUP PACEART REMOTE DEVICE CHECK
Battery Remaining Percentage: 90 %
Brady Statistic RA Percent Paced: 95 %
Brady Statistic RV Percent Paced: 7 %
Date Time Interrogation Session: 20230205065323
Implantable Lead Implant Date: 20220204
Implantable Lead Implant Date: 20220204
Implantable Lead Location: 753859
Implantable Lead Location: 753860
Implantable Lead Model: 377169
Implantable Lead Model: 377171
Implantable Lead Serial Number: 8000143637
Implantable Lead Serial Number: 8000151447
Implantable Pulse Generator Implant Date: 20220204
Lead Channel Impedance Value: 527 Ohm
Lead Channel Impedance Value: 527 Ohm
Lead Channel Pacing Threshold Amplitude: 1 V
Lead Channel Pacing Threshold Amplitude: 1.2 V
Lead Channel Pacing Threshold Pulse Width: 0.4 ms
Lead Channel Pacing Threshold Pulse Width: 0.4 ms
Lead Channel Sensing Intrinsic Amplitude: 1.5 mV
Lead Channel Sensing Intrinsic Amplitude: 9.4 mV
Lead Channel Setting Pacing Amplitude: 3 V
Lead Channel Setting Pacing Amplitude: 3 V
Lead Channel Setting Pacing Pulse Width: 0.4 ms
Pulse Gen Model: 407145
Pulse Gen Serial Number: 70020732

## 2021-05-28 ENCOUNTER — Ambulatory Visit (INDEPENDENT_AMBULATORY_CARE_PROVIDER_SITE_OTHER): Payer: PPO

## 2021-05-28 DIAGNOSIS — I495 Sick sinus syndrome: Secondary | ICD-10-CM | POA: Diagnosis not present

## 2021-05-30 ENCOUNTER — Encounter: Payer: Self-pay | Admitting: Internal Medicine

## 2021-05-30 ENCOUNTER — Telehealth: Payer: Self-pay

## 2021-05-30 DIAGNOSIS — H43393 Other vitreous opacities, bilateral: Secondary | ICD-10-CM | POA: Diagnosis not present

## 2021-05-30 DIAGNOSIS — H40033 Anatomical narrow angle, bilateral: Secondary | ICD-10-CM | POA: Diagnosis not present

## 2021-05-30 NOTE — Chronic Care Management (AMB) (Signed)
Chronic Care Management Pharmacy Assistant   Name: TRANISE FORREST  MRN: [id number] DOB: [birth year]  Sheila Morales is an 71 y.o. year old female who presents for his follow-up CCM visit with the clinical pharmacist.  Reason for Encounter: Disease State-General    Recent office visits:  05/04/21 Hoyt Koch, MD-PCP (Acute blood loss anemia) Labs ordered, med changes :nystatin  04/10/21 Hoyt Koch, MD-PCP (Community acquired pneumonia, unspecified laterality)  Recent consult visits:  05/22/21 Hunsucker, Bonna Gains, MD-Pulmonary Disease (Eosinophilic pneumonia) Orders:chest x-ray today, no med changes 05/22/21 Emmaline Life, NP-Cardiology (Medication management) Labs:BMP, no med changes 03/21/21 Martyn Ehrich, NP Hospital visits:  Medication Reconciliation was completed by comparing discharge summary, patients EMR and Pharmacy list, and upon discussion with patient.  Admitted to the hospital on 04/23/21 due to acute metabolic encephalopathy. Discharge date was 04/26/21. Discharged from [facility].    Medications that remain the same after Hospital Discharge:??  -All other medications will remain the same.    Admitted to the hospital on 04/14/21 due to sprain toe. Discharge date was 04/14/21. Discharged from [place] [facility].    Medications that remain the same after Hospital Discharge:??  -All other medications will remain the same.    Admitted to the hospital on 03/21/21 due to CHF. Discharge date was 04/01/21. Discharged from [facility].  Stop taking these medications: Doxycycline 100 mg Ibuprofen 200 mg Telmisartan 40 mg  Medications: Outpatient Encounter Medications as of 05/30/2021  Medication Sig   albuterol (VENTOLIN HFA) 108 (90 Base) MCG/ACT inhaler Inhale 1-2 puffs into the lungs every 6 (six) hours as needed for wheezing or shortness of breath.   amiodarone (PACERONE) 200 MG tablet Take 1 tablet  (200 mg total) by mouth 2 (two) times daily.   ANORO ELLIPTA 62.5-25 MCG/ACT AEPB INHALE 1 PUFF BY MOUTH EVERY DAY   apixaban (ELIQUIS) 5 MG TABS tablet Take 1 tablet (5 mg total) by mouth 2 (two) times daily.   benzonatate (TESSALON) 200 MG capsule Take 1 capsule (200 mg total) by mouth every 6 (six) hours as needed for cough.   calcium carbonate (OS-CAL - DOSED IN MG OF ELEMENTAL CALCIUM) 1250 (500 Ca) MG tablet Take 1 tablet by mouth every evening.   Cholecalciferol (VITAMIN D) 50 MCG (2000 UT) tablet Take 2,000 Units by mouth every evening.   diltiazem (CARDIZEM CD) 360 MG 24 hr capsule Take 1 capsule (360 mg total) by mouth daily.   folic acid (FOLVITE) 1 MG tablet Take 1 mg by mouth every evening.   furosemide (LASIX) 20 MG tablet Take 1 tablet (20 mg total) by mouth daily.   gabapentin (NEURONTIN) 300 MG capsule TAKE TWO CAPSULES BY MOUTH EVERY MORNING, TWO CAPSULES EACH AFTERNOON AND THREE CAPSULES AT BEDTIME (Patient taking differently: Take 600-900 mg by mouth See admin instructions.)   hydroxychloroquine (PLAQUENIL) 200 MG tablet Take 200 mg by mouth daily.   methotrexate 2.5 MG tablet Take 10 mg by mouth every Thursday.   metoprolol tartrate (LOPRESSOR) 25 MG tablet Take 1 tablet (25 mg total) by mouth 2 (two) times daily.   Misc Natural Products (ADVANCED JOINT RELIEF) CAPS Take 1 capsule by mouth daily. Joint Advantage Gold 5x   Multiple Vitamin (MULTIVITAMIN WITH MINERALS) TABS tablet Take 1 tablet by mouth daily.   nystatin (MYCOSTATIN) 100000 UNIT/ML suspension Take 5 mLs (500,000 Units total) by mouth in the morning and at bedtime.   predniSONE (DELTASONE) 20 MG  tablet Take prednisone 40 mg daily x 14 days, then prednisone 30 mg x 14 days, then prednisone 20 mg x 14 days, then prednisone 15 mg x 14 days, then prednisone mg x 14 days, then prednisone 5 mg x 14 days.   RESTASIS 0.05 % ophthalmic emulsion Place 1 drop into both eyes 2 (two) times daily.   sodium chloride 1 g tablet  Take 1 tablet (1 g total) by mouth 3 (three) times daily with meals.   sulfamethoxazole-trimethoprim (BACTRIM) 400-80 MG tablet Take 1 tablet by mouth 3 (three) times a week.   vitamin C (ASCORBIC ACID) 500 MG tablet Take 500 mg by mouth daily.   vitamin E 180 MG (400 UNITS) capsule Take 400 Units by mouth daily.   zolpidem (AMBIEN) 5 MG tablet TAKE 1 TABLET BY MOUTH AT BEDTIME AS NEEDED FOR SLEEP.   No facility-administered encounter medications on file as of 05/30/2021.   Have you had any problems recently with your health?Patient states that her only issue is weight gain. She states she is taking and extra lasix tab,but she has no other new health issues  Have you had any problems with your pharmacy?Patient states that she is not having any problems with getting medications or the cost of medications from the pharmacy  What issues or side effects are you having with your medications?Patient states that she is not having any side effects from medications  What would you like me to pass along to [facility] for them to help you with? Patient states that she is doing well  What can we do to take care of you better? Patient states she does not need anything at this time  Care Gaps: Colonoscopy-07/05/14 Diabetic Foot Exam-NA Mammogram-NA Ophthalmology-NA Dexa Scan - NA Annual Well Visit -  Micro albumin-NA Hemoglobin A1c- NA  Star Rating Drugs: None ID   Ethelene Hal Clinical Pharmacist Assistant [phone]

## 2021-05-30 NOTE — Progress Notes (Signed)
Remote pacemaker transmission.   

## 2021-06-07 ENCOUNTER — Telehealth: Payer: Self-pay | Admitting: Internal Medicine

## 2021-06-07 NOTE — Telephone Encounter (Signed)
Pt requesting a call back to discuss "what's causing her equilibrium to be unbalanced"  Offered pt an appt, pt declined

## 2021-06-07 NOTE — Telephone Encounter (Signed)
Pt c/o BP issue: STAT if pt c/o blurred vision, one-sided weakness or slurred speech  1. What are your last 5 BP readings?   2/16:    92/55 91 (2:06p)  91/46 66 (2:08p)  91/54 64 (2:30p)  84/48 85 (3:30p)  83/42 60   2. Are you having any other symptoms (ex. Dizziness, headache, blurred vision, passed out)?  Patient has been sluggish   3. What is your BP issue?   BP has been extremely low today.

## 2021-06-07 NOTE — Telephone Encounter (Signed)
Spoke with pt, her recent check of her blood pressure it was 114. She is still taking the prednisone and sodium pills. She reports she is swollen and reports SOB with exertion like vacuuming. Her weight has gone from 125 to 130. She reports a trimmer in her arms occasionally. She reports she passed out on Sunday, there was no dizziness or any symptoms prior. She is taking the diltiazem 360 daily and the metoprolol 25 mg twice daily. She can not tell when she is in atrial fib. She is aware dr [name] is out and would like this message forwarded to dr Audie Box, as he is the last person she saw. Will forward to dr Audie Box to review.

## 2021-06-08 NOTE — Telephone Encounter (Signed)
Called patient, advised of note from Dr.O'Neal.  Patient verbalized understanding for med changes, they will monitor BP at home. They recently purchased a kardia mobile device and will use this and send updates. They will call next week with an update on how she is doing.   Thanks!

## 2021-06-13 NOTE — Telephone Encounter (Signed)
Called pt. Sheila Morales asking her to give our office a call back. Office number was provided.

## 2021-06-14 NOTE — Telephone Encounter (Signed)
Returned call to patient who states that she is calling back to follow up on issues from last week. Patient reports that she has taken increased dose of Lasix 40mg  for 3 days but reports she has gained 3 pounds in the last few days despite the increased dose. Patient reports that her face and neck are swollen but denies any lower extremity swelling. Patient states that she has also been off of her Diltiazem for the last several days and feels much better. Patient states that her BP has been "normal" in the mornings and drops in the afternoon when she checks it a second time to around 25'Z-56'L systolic. Patient denies any chest pain, shortness of breath or lightheadedness. Patient is concerned about her lasix and swelling going forward, and what to do about her diltiazem. Since swelling has not improved and patient reports weight gain with increased lasix- scheduled patient to see Dr. Audie Box tomorrow 2/24 at 4pm. Patient is aware of appointment time and verbalized understanding.

## 2021-06-14 NOTE — Telephone Encounter (Signed)
Patient states she was told to take lasix 3 days in a row for her swelling, but. she says she has gained weight. She says she went from 129 lbs to 132 lbs. She says she was also taken off diltiazem, because she was having trouble with equilibrium. She would like to know if she is supposed to continue as she is or if her medications need to be changed again.

## 2021-06-14 NOTE — Progress Notes (Signed)
Cardiology Office Note:   Date:  06/15/2021  NAME:  Sheila Morales    MRN: [id number] DOB:  09-03-1950   PCP:  Hoyt Koch, MD  Cardiologist:  Elouise Munroe, MD  Electrophysiologist:  [name], MD   Referring MD: Hoyt Koch, *   Chief Complaint  Patient presents with   Follow-up         History of Present Illness:   Sheila Morales is a 71 y.o. female with a hx of HFpEF, persistent Afib, COPD, SLE who presents for follow-up.  She reports swelling in her face.  She had intermittent swelling in her legs.  Weights are stable.  No evidence of heart failure.  She has been taking Lasix extra doses periodically.  Does not do this.  EKG shows she is in sinus rhythm.  She is a paced.  She can reduce her amiodarone dose.  It appears pulmonary believes she has a chronic organizing pneumonia.  I think she needs to be on something besides prednisone as she clearly has swelling related to prednisone.  We stopped her diltiazem.  Blood pressure is well controlled.  No evidence of worsening heart failure.  Again her swelling is nondependent.  I suspect this is related to chronic prednisone.  She has been on prednisone since early January.  Denies any significant chest pain or shortness of breath.  No major bleeding on Eliquis.  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     Past Surgical History: Past  Surgical History:  Procedure Laterality Date   CHOLECYSTECTOMY     CHOLECYSTECTOMY, LAPAROSCOPIC     COLONOSCOPY     ESOPHAGOGASTRODUODENOSCOPY     lesion, vulva excision     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   Procedure: PACEMAKER IMPLANT;  Surgeon: [name], MD;  Location: [facility];  Service: Cardiovascular;  Laterality: N/A;   TONSILLECTOMY     VIDEO BRONCHOSCOPY Bilateral 11/06/2017   Procedure: VIDEO BRONCHOSCOPY WITHOUT FLUORO;  Surgeon: Marshell Garfinkel, MD;  Location: [facility];  Service: Cardiopulmonary;  Laterality: Bilateral;    Current Medications: Current Meds  Medication Sig   albuterol (VENTOLIN HFA) 108 (90 Base) MCG/ACT inhaler Inhale 1-2 puffs into the lungs every 6 (six) hours as needed for wheezing or shortness of breath.   ANORO ELLIPTA 62.5-25 MCG/ACT AEPB INHALE 1 PUFF BY MOUTH EVERY DAY   benzonatate (TESSALON) 200 MG capsule Take 1 capsule (200 mg total) by mouth every 6 (six) hours as needed for cough.   calcium carbonate (OS-CAL - DOSED IN MG OF ELEMENTAL CALCIUM) 1250 (500 Ca) MG tablet Take 1 tablet by mouth every evening.   Cholecalciferol (VITAMIN D) 50 MCG (2000 UT) tablet Take 2,000 Units by mouth every evening.   folic acid (FOLVITE) 1 MG tablet Take 1 mg by mouth every evening.  gabapentin (NEURONTIN) 300 MG capsule TAKE TWO CAPSULES BY MOUTH EVERY MORNING, TWO CAPSULES EACH AFTERNOON AND THREE CAPSULES AT BEDTIME (Patient taking differently: Take 600-900 mg by mouth See admin instructions.)   hydroxychloroquine (PLAQUENIL) 200 MG tablet Take 200 mg by mouth daily.   methotrexate 2.5 MG tablet Take 10 mg by mouth every Thursday.   Misc Natural Products (ADVANCED JOINT RELIEF) CAPS Take 1 capsule by mouth daily. Joint Advantage Gold 5x   Multiple Vitamin (MULTIVITAMIN WITH MINERALS) TABS tablet Take 1 tablet by mouth daily.   nystatin (MYCOSTATIN) 100000 UNIT/ML suspension Take 5 mLs (500,000 Units total) by mouth in the morning and at  bedtime.   predniSONE (DELTASONE) 20 MG tablet Take prednisone 40 mg daily x 14 days, then prednisone 30 mg x 14 days, then prednisone 20 mg x 14 days, then prednisone 15 mg x 14 days, then prednisone mg x 14 days, then prednisone 5 mg x 14 days.   RESTASIS 0.05 % ophthalmic emulsion Place 1 drop into both eyes 2 (two) times daily.   sodium chloride 1 g tablet Take 1 tablet (1 g total) by mouth 3 (three) times daily with meals.   sulfamethoxazole-trimethoprim (BACTRIM) 400-80 MG tablet Take 1 tablet by mouth 3 (three) times a week.   vitamin C (ASCORBIC ACID) 500 MG tablet Take 500 mg by mouth daily.   vitamin E 180 MG (400 UNITS) capsule Take 400 Units by mouth daily.   zolpidem (AMBIEN) 5 MG tablet TAKE 1 TABLET BY MOUTH AT BEDTIME AS NEEDED FOR SLEEP.     Allergies:    Patient has no known allergies.   Social History: Social History   Socioeconomic History   Marital status: Married    Spouse name: Not on file   Number of children: 2   Years of education: 12   Highest education level: Not on file  Occupational History   Occupation: Education administrator business   Occupation: caregiver  Tobacco Use   Smoking status: Former    Packs/day: 0.25    Years: 30.00    Pack years: 7.50    Types: Cigarettes    Quit date: 01/21/2020    Years since quitting: 1.4   Smokeless tobacco: Never   Tobacco comments:    smokes "every now and then" 08/24/20 [facility]   Vaping Use   Vaping Use: Never used  Substance and Sexual Activity   Alcohol use: Yes    Alcohol/week: 5.0 standard drinks    Types: 5 Standard drinks or equivalent per week    Comment: 4 out of 7 dyas per husband at bedside   Drug use: No   Sexual activity: Yes  Other Topics Concern   Not on file  Social History Narrative   HSG. Married '71. 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       One level home with spouse   Right handed   Caffeine - coffee 2-3  cups am   Exercise - treadmill    Education - 12th grade   Social Determinants of Health   Financial Resource Strain: Not on file  Food Insecurity: Not on file  Transportation Needs: Not on file  Physical Activity: Not on file  Stress: Not on file  Social Connections: Not on file     Family History: The patient's family history includes Atrial fibrillation in her father; Colon cancer in her father; Healthy in her daughter; Other in her  sister; Stroke in her mother.  ROS:   All other ROS reviewed and negative. Pertinent positives noted in the HPI.     EKGs/Labs/Other Studies Reviewed:   The following studies were personally reviewed by me today:  EKG:  EKG is ordered today.  The ekg ordered today demonstrates atrial paced rhythm heart rate 66, incomplete right bundle branch block, and was personally reviewed by me.   Recent Labs: 03/21/2021: Pro B Natriuretic peptide (BNP) 1,279.0 04/23/2021: B Natriuretic Peptide 533.7; TSH 1.577 04/25/2021: Magnesium 2.2 05/04/2021: ALT 44 05/22/2021: BUN 22; Creatinine, Ser 1.18; Hemoglobin 11.9; Platelets 194; Potassium 4.5; Sodium 136   Recent Lipid Panel    Component Value Date/Time   CHOL 148 03/29/2019 0909   TRIG 66.0 03/29/2019 0909   HDL 63.60 03/29/2019 0909   CHOLHDL 2 03/29/2019 0909   VLDL 13.2 03/29/2019 0909   LDLCALC 71 03/29/2019 0909   LDLDIRECT 52.0 02/24/2017 1351    Physical Exam:   VS:  BP 120/60    Pulse 66    Ht 5\' 2"  (1.575 m)    Wt 136 lb 6.4 oz (61.9 kg)    SpO2 93%    BMI 24.95 kg/m    Wt Readings from Last 3 Encounters:  06/15/21 136 lb 6.4 oz (61.9 kg)  05/22/21 135 lb 6.4 oz (61.4 kg)  05/22/21 135 lb 6.4 oz (61.4 kg)    General: Well nourished, well developed, in no acute distress Head: Atraumatic, normal size  Eyes: Swelling noted in the face and around the eyes, periorbital edema Neck: Supple, no JVD Endocrine: No thryomegaly Cardiac: Normal S1, S2; RRR; no murmurs, rubs, or gallops Lungs: Clear  to auscultation bilaterally, no wheezing, rhonchi or rales  Abd: Soft, nontender, no hepatomegaly  Ext: No edema, pulses 2+ Musculoskeletal: No deformities, BUE and BLE strength normal and equal Skin: Warm and dry, no rashes   Neuro: Alert and oriented to person, place, time, and situation, CNII-XII grossly intact, no focal deficits  Psych: Normal mood and affect   ASSESSMENT:   Sheila Morales is a 71 y.o. female who presents for the following: 1. Chronic diastolic heart failure ([place])   2. PAF (paroxysmal atrial fibrillation) ([place])   3. Essential hypertension   4. Sinus node dysfunction (HCC)   5. Facial swelling     PLAN:   1. Chronic diastolic heart failure (HCC) -No evidence of volume overload on exam.  Her swelling is nondependent.  This is likely swelling in her face and periorbital edema related to prednisone use.  I recommended she continue Lasix 20 mg daily.  She can take an extra tablet as needed for weight gain but really has no evidence of volume overload on exam.  I did inform her that she needs to reach out to her pulmonologist to get off prednisone.  I believe this is the etiology of her swelling. -We will set her up for repeat BMP.  She is worried about her sodium.  It normalized on last lab draw.  2. PAF (paroxysmal atrial fibrillation) (HCC) -Maintaining sinus rhythm.  Reduce amiodarone to 200 mg daily.  She can follow-up with her primary care physician for further rhythm management.  Amiodarone likely not a good long-term solution for her.  Eliquis was held in the hospital but appears to be back on this.  No bleeding.  Continue Eliquis 5 mg twice daily. -She stop diltiazem.  Continue metoprolol.  Maintaining rhythm on amiodarone.  3. Essential hypertension -Well-controlled.  4. Sinus  node dysfunction (HCC) -Pacemaker functioning well.  5. Facial swelling -Secondary to prednisone.  Not related to congestive heart failure.  Disposition: Return if symptoms worsen or  fail to improve.  Medication Adjustments/Labs and Tests Ordered: Current medicines are reviewed at length with the patient today.  Concerns regarding medicines are outlined above.  Orders Placed This Encounter  Procedures   Basic metabolic panel   EKG 16-OMAY   Meds ordered this encounter  Medications   amiodarone (PACERONE) 200 MG tablet    Sig: Take 1 tablet (200 mg total) by mouth daily.    Dispense:  180 tablet    Refill:  0   furosemide (LASIX) 20 MG tablet    Sig: Take 1 tablet (20 mg) by mouth daily, take 1 extra tablet by mouth (20 mg) as needed for increased swelling.    Dispense:  90 tablet    Refill:  0    Patient Instructions  Medication Instructions:  Decrease Amiodarone 200 mg daily  STOP Diltiazem  Take extra lasix 20 mg as needed   *If you need a refill on your cardiac medications before your next appointment, please call your pharmacy*   Lab Work: BMET today   If you have labs (blood work) drawn today and your tests are completely normal, you will receive your results only by: [facility] (if you have MyChart) OR A paper copy in the mail If you have any lab test that is abnormal or we need to change your treatment, we will call you to review the results.   Follow-Up: At [facility], you and your health needs are our priority.  As part of our continuing mission to provide you with exceptional heart care, we have created designated Provider Care Teams.  These Care Teams include your primary Cardiologist (physician) and Advanced Practice Providers (APPs -  Physician Assistants and Nurse Practitioners) who all work together to provide you with the care you need, when you need it.  We recommend signing up for the patient portal called "MyChart".  Sign up information is provided on this After Visit Summary.  MyChart is used to connect with patients for Virtual Visits (Telemedicine).  Patients are able to view lab/test results, encounter notes, upcoming  appointments, etc.  Non-urgent messages can be sent to your provider as well.   To learn more about what you can do with MyChart, go to [email or web address].    Your next appointment:   Keep follow up appointment as scheduled   The format for your next appointment:   In Person  Provider:   Elouise Munroe, MD     Other Instructions See Pulmonologist- try something else than Prednisone     Time Spent with Patient: I have spent a total of 35 minutes with patient reviewing hospital notes, telemetry, EKGs, labs and examining the patient as well as establishing an assessment and plan that was discussed with the patient.  > 50% of time was spent in direct patient care.  Signed, Addison Naegeli. Audie Box, MD, [place]  [address], [place], [place] [address] [phone]  06/15/2021 4:47 PM

## 2021-06-15 ENCOUNTER — Encounter: Payer: Self-pay | Admitting: Cardiovascular Disease

## 2021-06-15 ENCOUNTER — Other Ambulatory Visit: Payer: Self-pay

## 2021-06-15 ENCOUNTER — Ambulatory Visit: Payer: PPO | Admitting: Cardiovascular Disease

## 2021-06-15 VITALS — BP 120/60 | HR 66 | Ht 62.0 in | Wt 136.4 lb

## 2021-06-15 DIAGNOSIS — I480 Paroxysmal atrial fibrillation: Secondary | ICD-10-CM | POA: Diagnosis not present

## 2021-06-15 DIAGNOSIS — I495 Sick sinus syndrome: Secondary | ICD-10-CM | POA: Diagnosis not present

## 2021-06-15 DIAGNOSIS — I5032 Chronic diastolic (congestive) heart failure: Secondary | ICD-10-CM

## 2021-06-15 DIAGNOSIS — S0501XA Injury of conjunctiva and corneal abrasion without foreign body, right eye, initial encounter: Secondary | ICD-10-CM | POA: Diagnosis not present

## 2021-06-15 DIAGNOSIS — I10 Essential (primary) hypertension: Secondary | ICD-10-CM

## 2021-06-15 DIAGNOSIS — R220 Localized swelling, mass and lump, head: Secondary | ICD-10-CM | POA: Diagnosis not present

## 2021-06-15 MED ORDER — AMIODARONE HCL 200 MG PO TABS: 200.0000 mg | ORAL_TABLET | Freq: Every day | ORAL | 0 refills | Status: DC

## 2021-06-15 MED ORDER — FUROSEMIDE 20 MG PO TABS: ORAL_TABLET | ORAL | 0 refills | Status: DC

## 2021-06-15 NOTE — Patient Instructions (Signed)
Medication Instructions:  Decrease Amiodarone 200 mg daily  STOP Diltiazem  Take extra lasix 20 mg as needed   *If you need a refill on your cardiac medications before your next appointment, please call your pharmacy*   Lab Work: BMET today   If you have labs (blood work) drawn today and your tests are completely normal, you will receive your results only by: [place] (if you have MyChart) OR A paper copy in the mail If you have any lab test that is abnormal or we need to change your treatment, we will call you to review the results.   Follow-Up: At [facility], you and your health needs are our priority.  As part of our continuing mission to provide you with exceptional heart care, we have created designated Provider Care Teams.  These Care Teams include your primary Cardiologist (physician) and Advanced Practice Providers (APPs -  Physician Assistants and Nurse Practitioners) who all work together to provide you with the care you need, when you need it.  We recommend signing up for the patient portal called "MyChart".  Sign up information is provided on this After Visit Summary.  MyChart is used to connect with patients for Virtual Visits (Telemedicine).  Patients are able to view lab/test results, encounter notes, upcoming appointments, etc.  Non-urgent messages can be sent to your provider as well.   To learn more about what you can do with MyChart, go to [email or web address].    Your next appointment:   Keep follow up appointment as scheduled   The format for your next appointment:   In Person  Provider:   Elouise Munroe, MD     Other Instructions See Pulmonologist- try something else than Prednisone

## 2021-06-16 ENCOUNTER — Other Ambulatory Visit: Payer: Self-pay | Admitting: Emergency Medicine

## 2021-06-18 DIAGNOSIS — I5032 Chronic diastolic (congestive) heart failure: Secondary | ICD-10-CM | POA: Diagnosis not present

## 2021-06-18 LAB — BASIC METABOLIC PANEL
BUN/Creatinine Ratio: 14 (ref 12–28)
BUN: 19 mg/dL (ref 8–27)
CO2: 28 mmol/L (ref 20–29)
Calcium: 8.8 mg/dL (ref 8.7–10.3)
Chloride: 91 mmol/L — ABNORMAL LOW (ref 96–106)
Creatinine, Ser: 1.33 mg/dL — ABNORMAL HIGH (ref 0.57–1.00)
Glucose: 67 mg/dL — ABNORMAL LOW (ref 70–99)
Potassium: 4.1 mmol/L (ref 3.5–5.2)
Sodium: 130 mmol/L — ABNORMAL LOW (ref 134–144)
eGFR: 43 mL/min/{1.73_m2} — ABNORMAL LOW (ref >59)

## 2021-06-19 DIAGNOSIS — S0501XA Injury of conjunctiva and corneal abrasion without foreign body, right eye, initial encounter: Secondary | ICD-10-CM | POA: Diagnosis not present

## 2021-06-19 NOTE — Telephone Encounter (Signed)
Patient seen in office

## 2021-06-25 ENCOUNTER — Encounter: Payer: Self-pay | Admitting: Internal Medicine

## 2021-06-25 ENCOUNTER — Other Ambulatory Visit (INDEPENDENT_AMBULATORY_CARE_PROVIDER_SITE_OTHER): Payer: PPO

## 2021-06-25 DIAGNOSIS — E871 Hypo-osmolality and hyponatremia: Secondary | ICD-10-CM | POA: Diagnosis not present

## 2021-06-25 LAB — CBC WITH DIFFERENTIAL/PLATELET
Basophils Absolute: 0 10*3/uL (ref 0.0–0.1)
Basophils Relative: 0.4 % (ref 0.0–3.0)
Eosinophils Absolute: 0 10*3/uL (ref 0.0–0.7)
Eosinophils Relative: 0.1 % (ref 0.0–5.0)
HCT: 34.1 % — ABNORMAL LOW (ref 36.0–46.0)
Hemoglobin: 11.7 g/dL — ABNORMAL LOW (ref 12.0–15.0)
Lymphocytes Relative: 4.7 % — ABNORMAL LOW (ref 12.0–46.0)
Lymphs Abs: 0.4 10*3/uL — ABNORMAL LOW (ref 0.7–4.0)
MCHC: 34.2 g/dL (ref 30.0–36.0)
MCV: 97.7 fl (ref 78.0–100.0)
Monocytes Absolute: 0.4 10*3/uL (ref 0.1–1.0)
Monocytes Relative: 4.8 % (ref 3.0–12.0)
Neutro Abs: 7.5 10*3/uL (ref 1.4–7.7)
Neutrophils Relative %: 90 % — ABNORMAL HIGH (ref 43.0–77.0)
Platelets: 207 10*3/uL (ref 150.0–400.0)
RBC: 3.49 Mil/uL — ABNORMAL LOW (ref 3.87–5.11)
RDW: 17.8 % — ABNORMAL HIGH (ref 11.5–15.5)
WBC: 8.4 10*3/uL (ref 4.0–10.5)

## 2021-06-25 LAB — BASIC METABOLIC PANEL
BUN: 20 mg/dL (ref 6–23)
CO2: 29 mEq/L (ref 19–32)
Calcium: 9 mg/dL (ref 8.4–10.5)
Chloride: 93 mEq/L — ABNORMAL LOW (ref 96–112)
Creatinine, Ser: 1.47 mg/dL — ABNORMAL HIGH (ref 0.40–1.20)
GFR: 35.66 mL/min — ABNORMAL LOW (ref >60.00)
Glucose, Bld: 164 mg/dL — ABNORMAL HIGH (ref 70–99)
Potassium: 4.5 mEq/L (ref 3.5–5.1)
Sodium: 130 mEq/L — ABNORMAL LOW (ref 135–145)

## 2021-06-27 ENCOUNTER — Telehealth: Payer: Self-pay | Admitting: Pulmonary Disease

## 2021-06-27 ENCOUNTER — Telehealth: Payer: Self-pay | Admitting: Internal Medicine

## 2021-06-27 NOTE — Telephone Encounter (Signed)
Patient calling in ? ?Patient is planning to travel to [place] in June.. wants to know bc of her health conditions if it is okay for her to travel & if there is anything shes needs to do medically beforehand to prepare ? ?Please fu [phone] ?

## 2021-06-27 NOTE — Telephone Encounter (Signed)
Called patient and she states she is going on a cruise on June and is worried if she is okay to go on the cruise given her health. I told patient I would message MH to get his input on this question.  ? ?Dr Silas Flood please advise  ?

## 2021-06-28 ENCOUNTER — Encounter: Payer: Self-pay | Admitting: Internal Medicine

## 2021-06-28 NOTE — Telephone Encounter (Signed)
I see no reason she should not go on the cruise. Obviously, if symptoms were to recur or worsen we should re-evaluate.

## 2021-06-28 NOTE — Telephone Encounter (Signed)
Spoke to patient's husband.He stated he is concerned about wife.Stated she is weaker and has no energy.She is having swelling in both lower legs.She had a bmet done this past Monday and was told due to her kidney functions abnormal she cannot increase Lasix.Appointment scheduled with Diona Browner PA tomorrow 3/10 at 11:00 am.Advised to bring a list of all medications.He will bring [facility] showing her heart has been in rhythm.  ?

## 2021-06-28 NOTE — Telephone Encounter (Signed)
See previous mychart note. 

## 2021-06-28 NOTE — Progress Notes (Addendum)
Office Visit    Patient Name: Sheila Morales Date of Encounter: 06/29/2021  Primary Care Provider:  Hoyt Koch, MD Primary Cardiologist:  Elouise Munroe, MD  Chief Complaint    71 year old female with a history of chronic diastolic heart failure, persistent atrial fibrillation on Eliquis, SSS s/p PPM in 2022, hypertension, COPD, SLE, COPD, rheumatic fever, and ischemic colitis who presents for follow-up related to heart failure.  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    Past Surgical History:  Procedure Laterality Date   CHOLECYSTECTOMY     CHOLECYSTECTOMY, LAPAROSCOPIC     COLONOSCOPY     ESOPHAGOGASTRODUODENOSCOPY     lesion, vulva excision     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   Procedure: PACEMAKER IMPLANT;  Surgeon: [name], MD;  Location: [facility];  Service: Cardiovascular;  Laterality: N/A;   TONSILLECTOMY     VIDEO BRONCHOSCOPY Bilateral 11/06/2017   Procedure: VIDEO BRONCHOSCOPY WITHOUT FLUORO;  Surgeon: Marshell Garfinkel, MD;  Location: [facility];  Service: Cardiopulmonary;  Laterality: Bilateral;    Allergies  No Known Allergies  History of Present Illness    71 year old female with the above past medical history including chronic diastolic heart failure, persistent atrial fibrillation on Eliquis, SSS s/p PPM in 2022, hypertension, COPD, SLE, COPD, rheumatic fever, and ischemic colitis.  She underwent implantation of  dual-chamber pacemaker for symptomatic bradycardia in the setting of sinus node dysfunction in April 2022.  She was hospitalized in November 2022 in the setting of UTI with right-sided pyelonephritis and was noted to have new onset atrial fibrillation, acute diastolic heart failure, hyponatremia.  Hyponatremia was felt to be secondary to SIADH, managed with fluid restriction and sodium tablets. Echocardiogram at the time of admission showed EF 70 to 75%, mild LVH, no RWMA, normal RV, mildly dilated LA.  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    She returned to the ED in January 2023 with confusion, fatigue, myalgias, fever. She was admitted for possible pneumonia, CT of the head showed no acute abnormality, her confusion was thought to be secondary to infectious process and hyponatremia.  Additionally, her Hemoccult was positive with drop in hemoglobin from 10.4-7.3 over 3-week period.   She was last seen in the office on 06/15/2021 by Dr. Audie Box, in the setting of lower extremity edema, facial swelling.  Her weight was stable at the time. It was recommended she decrease her prednisone as it was thought to be contributing to her facial swelling.  Diltiazem was discontinued and amiodarone was was reduced. Her husband contacted our office on 06/28/2021 and stated he was concerned that over the last week his wife has had little energy, with overall increased fatigue, increased lower extremity edema, confusion, and equilibrium issues.  She was told by her PCP that  her Lasix could not be increased due to worsening renal function.  She was advised to follow-up in the office.  She presents today for follow-up accompanied by by her husband.  Since her last visit she contacted our office she has noticed an improvement in her lower extremity edema.  She does have ongoing generalized weakness, fatigue,  occasional weakness in her legs.  Her husband notes that over the past 5 days she has had a low-grade fever upon waking in the morning ranging from 99.6-100.4, improved with Tylenol. Her weight has stabilized over the past few days at home. She took an additional Lasix on 06/27/2021 but has not taken any since, as advised by her PCP. She does report ongoing dyspnea on exertion, this is unchanged since her most recent hospitalization. Both she and her husband think that her energy levels increased briefly while she was taking prednisone, however, since the taper of prednisone, she has noticed her energy has decreased to where it was at the time of her hospital discharge in January 2023. She has follow-up scheduled pulmonology on 07/03/2021.  She also has follow-up scheduled with rheumatology.   Home Medications    Current Outpatient Medications  Medication Sig Dispense Refill   albuterol (VENTOLIN HFA) 108 (90 Base) MCG/ACT inhaler Inhale 1-2 puffs into the lungs every 6 (six) hours as needed for wheezing or shortness of breath. 8 g 5   amiodarone (PACERONE) 200 MG tablet Take 1 tablet (200 mg total) by mouth daily. 180 tablet 0   ANORO ELLIPTA 62.5-25 MCG/ACT AEPB INHALE 1 PUFF BY MOUTH EVERY DAY 60 each 0   benzonatate (TESSALON) 200 MG capsule Take 1 capsule (200 mg total) by mouth every 6 (six) hours as needed for cough. 30 capsule 1   calcium carbonate (OS-CAL - DOSED IN MG OF ELEMENTAL CALCIUM) 1250 (500 Ca) MG tablet Take 1 tablet by mouth every evening.     Cholecalciferol (VITAMIN D) 50 MCG (2000 UT) tablet Take 2,000 Units by mouth every evening.     folic acid (FOLVITE) 1 MG tablet Take 1 mg by mouth every evening.     furosemide (LASIX) 20 MG tablet Take 1 tablet (20 mg) by mouth daily, take 1 extra tablet by mouth (20 mg) as needed for increased swelling. 90 tablet 0   gabapentin (NEURONTIN) 300 MG capsule TAKE TWO CAPSULES BY MOUTH EVERY MORNING, TWO CAPSULES EACH AFTERNOON AND THREE CAPSULES  AT BEDTIME (Patient taking differently: Take 600-900 mg by mouth See admin instructions.) 630 capsule 0   hydroxychloroquine (PLAQUENIL) 200 MG tablet Take 200 mg by mouth daily.     methotrexate 2.5 MG tablet Take 10 mg by mouth every Thursday.     Misc Natural Products (ADVANCED JOINT RELIEF) CAPS Take 1 capsule by mouth daily. Joint Advantage Gold 5x     Multiple Vitamin (MULTIVITAMIN WITH MINERALS) TABS tablet Take 1 tablet by mouth daily.     predniSONE (DELTASONE) 20 MG tablet Take prednisone 40 mg daily x 14 days, then prednisone 30 mg x 14 days, then prednisone 20 mg x 14 days, then prednisone 15 mg x 14 days, then prednisone mg x 14 days, then prednisone 5 mg x 14 days. 100 tablet 0   RESTASIS 0.05 % ophthalmic emulsion Place 1 drop into both eyes 2 (two) times daily.  6   sodium chloride 1 g tablet Take 1 tablet (1 g total) by mouth 3 (three) times daily with meals. (Patient taking differently: Take 1 g by  mouth 2 (two) times daily with a meal.) 60 tablet 0   sulfamethoxazole-trimethoprim (BACTRIM) 400-80 MG tablet Take 1 tablet by mouth 3 (three) times a week. 60 tablet 0   vitamin C (ASCORBIC ACID) 500 MG tablet Take 500 mg by mouth daily.     vitamin E 180 MG (400 UNITS) capsule Take 400 Units by mouth daily.     zolpidem (AMBIEN) 5 MG tablet TAKE 1 TABLET BY MOUTH AT BEDTIME AS NEEDED FOR SLEEP. 30 tablet 5   apixaban (ELIQUIS) 5 MG TABS tablet Take 1 tablet (5 mg total) by mouth 2 (two) times daily. 180 tablet 0   metoprolol tartrate (LOPRESSOR) 25 MG tablet Take 1 tablet (25 mg total) by mouth 2 (two) times daily. 180 tablet 0   No current facility-administered medications for this visit.     Review of Systems    She denies chest pain, palpitations, pnd, orthopnea, n, v, dizziness, syncope, weight gain, or early satiety. All other systems reviewed and are otherwise negative except as noted above.   Physical Exam    VS:  BP 120/60 (BP Location: Left Arm, Patient Position:  Sitting, Cuff Size: Normal)    Pulse 72    Ht '5\' 2"'$  (1.575 m)    Wt 137 lb 6.4 oz (62.3 kg)    SpO2 99%    BMI 25.13 kg/m  GEN: Well nourished, well developed, in no acute distress. HEENT: normal. Neck: Supple, no JVD, carotid bruits, or masses. Cardiac: RRR, no murmurs, rubs, or gallops. No clubbing, cyanosis, edema.  Radials/DP/PT 2+ and equal bilaterally.  Respiratory:  Respirations regular and unlabored, coarse crackles to bases bilaterally. GI: Soft, nontender, nondistended, BS + x 4. MS: no deformity or atrophy. Skin: warm and dry, no rash. Neuro:  Strength and sensation are intact. Psych: Normal affect.  Wt Readings from Last 3 Encounters:  06/29/21 137 lb 6.4 oz (62.3 kg)  06/15/21 136 lb 6.4 oz (61.9 kg)  05/22/21 135 lb 6.4 oz (61.4 kg)    Accessory Clinical Findings    ECG personally reviewed by me today -atrial paced, 72 bpm, nonspecific ST/T wave abnormality- no acute changes.  Lab Results  Component Value Date   WBC 8.4 06/25/2021   HGB 11.7 (L) 06/25/2021   HCT 34.1 (L) 06/25/2021   MCV 97.7 06/25/2021   PLT 207.0 06/25/2021   Lab Results  Component Value Date   CREATININE 1.47 (H) 06/25/2021   BUN 20 06/25/2021   NA 130 (L) 06/25/2021   K 4.5 06/25/2021   CL 93 (L) 06/25/2021   CO2 29 06/25/2021   Lab Results  Component Value Date   ALT 44 (H) 05/04/2021   AST 23 05/04/2021   ALKPHOS 59 05/04/2021   BILITOT 0.4 05/04/2021   Lab Results  Component Value Date   CHOL 148 03/29/2019   HDL 63.60 03/29/2019   LDLCALC 71 03/29/2019   LDLDIRECT 52.0 02/24/2017   TRIG 66.0 03/29/2019   CHOLHDL 2 03/29/2019    Lab Results  Component Value Date   HGBA1C 5.7 02/24/2017    Assessment & Plan    1. Chronic diastolic heart failure: Echo in 03/2021 showed EF 70- 75%, mild LVH, no RWMA, normal RV, mildly dilated LA. recent increase in lower extremity ankle edema, however, this is improved today.  Weight has stabilized over the past couple of days.  She is  up 1 pound over the last 2 weeks by our scale.  She does have mild coarse crackles  to her lung bases bilaterally, though she is generally euvolemic and well compensated on exam.  Discussed objective parameters for additional Lasix dosing including weight gain of 3 pounds overnight, 5 pounds in a week, increased lower extremity swelling, increased shortness of breath.  Patient and husband verbalized understanding.  Repeat BMET in 2 weeks.  Continue Lasix, metoprolol.   2. Generalized weakness/fatigue/dyspnea on exertion: Ongoing, she experienced this prior to starting prednisone, her symptoms slightly improved while taking higher doses of prednisone, but since her taper, her symptoms have returned. Denies symptoms concerning for angina. No indication for ischemic evaluation. Likely multifactorial setting of recent pneumonia, fluid volume overload, underlying pulmonary process, and autoimmune disease. Recommend follow-up with pulmonology and rheumatology.  3. Persistent atrial fibrillation: A-paced on monitor. Denies bleeding on Eliquis.  Continue amiodarone 200 mg daily, Eliquis, and metoprolol.  4. Sinus node dysfunction s/p PPM implantation: Remote device check 05/27/2021 showed normally functioning pacemaker.  5. Hypertension: BP well controlled. Continue current antihypertensive regimen.   6. SIADH with chronic hyponatremia: Sodium was 130 on 06/25/2021. She may liberalize sodium intake with caution given recent fluid volume overload/chronic diastolic heart failure.  Repeat BMET in 2 weeks as above.  7. CKD Stage IIIb: Creatinine was 1.47 on 06/25/2021.  Continue to monitor with ongoing diuresis. Repeat BMET pending as above.   8. Anemia: CBC showed stable hemoglobin 11.7 on 06/25/2021. Denies bleeding.  For now, continue Eliquis as above  9. Disposition: F/u in 1 month.   Lenna Sciara, NP 06/29/2021, 1:30 PM

## 2021-06-28 NOTE — Telephone Encounter (Signed)
Spoke with patient to let her know the recs from Dr. Silas Flood. She expressed understanding. Nothing further needed at this time.  ?

## 2021-06-29 ENCOUNTER — Other Ambulatory Visit: Payer: Self-pay

## 2021-06-29 ENCOUNTER — Ambulatory Visit (INDEPENDENT_AMBULATORY_CARE_PROVIDER_SITE_OTHER): Payer: PPO | Admitting: Nurse Practitioner

## 2021-06-29 ENCOUNTER — Encounter: Payer: Self-pay | Admitting: Nurse Practitioner

## 2021-06-29 VITALS — BP 120/60 | HR 72 | Ht 62.0 in | Wt 137.4 lb

## 2021-06-29 DIAGNOSIS — I5032 Chronic diastolic (congestive) heart failure: Secondary | ICD-10-CM

## 2021-06-29 DIAGNOSIS — Z950 Presence of cardiac pacemaker: Secondary | ICD-10-CM | POA: Diagnosis not present

## 2021-06-29 DIAGNOSIS — I10 Essential (primary) hypertension: Secondary | ICD-10-CM | POA: Diagnosis not present

## 2021-06-29 DIAGNOSIS — Z79899 Other long term (current) drug therapy: Secondary | ICD-10-CM | POA: Diagnosis not present

## 2021-06-29 DIAGNOSIS — R531 Weakness: Secondary | ICD-10-CM | POA: Diagnosis not present

## 2021-06-29 DIAGNOSIS — N1832 Chronic kidney disease, stage 3b: Secondary | ICD-10-CM | POA: Diagnosis not present

## 2021-06-29 DIAGNOSIS — E871 Hypo-osmolality and hyponatremia: Secondary | ICD-10-CM

## 2021-06-29 DIAGNOSIS — E222 Syndrome of inappropriate secretion of antidiuretic hormone: Secondary | ICD-10-CM | POA: Diagnosis not present

## 2021-06-29 DIAGNOSIS — I4819 Other persistent atrial fibrillation: Secondary | ICD-10-CM | POA: Diagnosis not present

## 2021-06-29 DIAGNOSIS — D649 Anemia, unspecified: Secondary | ICD-10-CM

## 2021-06-29 DIAGNOSIS — R0609 Other forms of dyspnea: Secondary | ICD-10-CM | POA: Diagnosis not present

## 2021-06-29 DIAGNOSIS — I495 Sick sinus syndrome: Secondary | ICD-10-CM

## 2021-06-29 NOTE — Patient Instructions (Signed)
Medication Instructions:  ?Your physician recommends that you continue on your current medications as directed. Please refer to the Current Medication list given to you today.  ? ?*If you need a refill on your cardiac medications before your next appointment, please call your pharmacy* ? ? ?Lab Work: ?Your physician recommends that you return for lab work in 2 weeks ?BMET ? ? ?If you have labs (blood work) drawn today and your tests are completely normal, you will receive your results only by: ?MyChart Message (if you have MyChart) OR ?A paper copy in the mail ?If you have any lab test that is abnormal or we need to change your treatment, we will call you to review the results. ? ? ?Testing/Procedures: ?NONE ordered at this time of appointment  ? ? ?Follow-Up: ?At [facility], you and your health needs are our priority.  As part of our continuing mission to provide you with exceptional heart care, we have created designated Provider Care Teams.  These Care Teams include your primary Cardiologist (physician) and Advanced Practice Providers (APPs -  Physician Assistants and Nurse Practitioners) who all work together to provide you with the care you need, when you need it. ? ?We recommend signing up for the patient portal called "MyChart".  Sign up information is provided on this After Visit Summary.  MyChart is used to connect with patients for Virtual Visits (Telemedicine).  Patients are able to view lab/test results, encounter notes, upcoming appointments, etc.  Non-urgent messages can be sent to your provider as well.   ?To learn more about what you can do with MyChart, go to [email or web address].   ? ?Your next appointment:   ?1 month(s) ? ?The format for your next appointment:   ?In Person ? ?Provider:   ?Diona Browner, NP      ? ? ?Other Instructions ?Weigh yourself EVERY morning after you go to the bathroom but before you eat or drink anything. Write this number down in a weight log/diary. If you gain 3  pounds overnight or 5 pounds in a week, call the office. Please take addition Lasix if needed.  ? ?Take your medicines as prescribed. If you have concerns about your medications, please call us before you stop taking them. ? ?Limit all fluids for the day to less than 2 liters (64 ounces). Fluid includes all drinks, coffee, juice, ice chips, soup, jello, and all other liquids. ? ?Stay as active as you can everyday. Staying active will give you more energy and make your muscles stronger. Start with 5 minutes at a time and work your way up to 30 minutes a day. Break up your activities--do some in the morning and some in the afternoon. Start with 3 days per week and work your way up to 5 days as you can.  If you have chest pain, feel short of breath, dizzy, or lightheaded, STOP. If you don't feel better after a short rest, call 911. If you do feel better, call the office to let us know you have symptoms with exercise. ?

## 2021-07-03 ENCOUNTER — Encounter: Payer: Self-pay | Admitting: Internal Medicine

## 2021-07-03 ENCOUNTER — Encounter: Payer: Self-pay | Admitting: Pulmonary Disease

## 2021-07-03 ENCOUNTER — Other Ambulatory Visit: Payer: Self-pay

## 2021-07-03 ENCOUNTER — Ambulatory Visit: Payer: PPO | Admitting: Pulmonary Disease

## 2021-07-03 ENCOUNTER — Ambulatory Visit (INDEPENDENT_AMBULATORY_CARE_PROVIDER_SITE_OTHER): Payer: PPO

## 2021-07-03 VITALS — BP 118/62 | HR 81 | Temp 99.2°F | Ht 62.0 in | Wt 136.0 lb

## 2021-07-03 DIAGNOSIS — J8281 Chronic eosinophilic pneumonia: Secondary | ICD-10-CM

## 2021-07-03 DIAGNOSIS — G609 Hereditary and idiopathic neuropathy, unspecified: Secondary | ICD-10-CM

## 2021-07-03 DIAGNOSIS — R0600 Dyspnea, unspecified: Secondary | ICD-10-CM | POA: Diagnosis not present

## 2021-07-03 DIAGNOSIS — R251 Tremor, unspecified: Secondary | ICD-10-CM

## 2021-07-03 DIAGNOSIS — I517 Cardiomegaly: Secondary | ICD-10-CM | POA: Diagnosis not present

## 2021-07-03 DIAGNOSIS — I5032 Chronic diastolic (congestive) heart failure: Secondary | ICD-10-CM

## 2021-07-03 NOTE — Progress Notes (Signed)
? ?'@Patient'$  ID: Sheila Morales, female    DOB: 1950/09/17, 71 y.o.   MRN: [id number] ? ?Chief Complaint  ?Patient presents with  ? Follow-up  ?  Follow up from January. Pt states she has good days and bad days. No stamina noted. She has balance issues noted. Some shortness of breath noted from husband   ? ? ?Referring provider: ?Hoyt Koch, * ? ?HPI:  ? ?71 y.o. 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  Most recent cardiology note x2 reviewed.  Most recent note from PCP reviewed. ? ?She continues on steroid taper.  Down to prednisone 10 mg daily.  Decreasing every 2 weeks.  Over the last 2 to 3 weeks she reports worsening dyspnea on the treadmill.  Less active.  Over the last several weeks low-grade fever 9 9.5-100 point 5 in the morning that resolves with Tylenol and does not recur during the day.  In addition for the last month or so she has had worsening gait imbalance.  Noticeably abnormal gait walking into the room.  Assisted with walker.  Denies falls at home.  In the context of the symptoms she has had her sodium checked twice with values at 130.  She denies any syncope or presyncope, no lightheadedness.  No nausea vomiting.  No signs or symptoms of adrenal insufficiency. ? ?Review chest x-ray at last visit during prednisone taper, chest x-ray dated 05/12/2021, that on my interpretation reveals greatly improved course interstitial and alveolar infiltrates with mild residual interstitial infiltrates in bilateral bases. ? ?HPI at initial visit: ?Briefly, history of infiltrates in the past with area of posterior right upper lobe scarring.  This predated her amiodarone.  Also on methotrexate.  However, was hospitalized in late 2022 with volume overload and groundglass opacities favored to be most likely related to pulmonary edema.  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  She was placed  on prednisone taper at discharge.  At the end of her prednisone, she developed dyspnea.  This worsened over the course of a few days.  Developed fever, came to hospital.  Was found to be hypoxemic.  Coarse interstitial infiltrates on chest x-ray on my review and interpretation.  CT chest showed worsening area of fibrotic changes in the posterior right upper lobe as well as scattered groundglass.  At time of evaluation he is on 5 L nasal cannula with accessory muscle use.  Felt too ill to safely tolerate bronchoscopy.  Recommend ongoing diuresis but started prednisone half a milligram per kilogram, 40 mg daily.  After 1 dose her fever subsided, her hypoxemia resolved, her dyspnea exertion went away.  She had peripheral eosinophilia 400-500.  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 ? ? ?Questionaires / Pulmonary Flowsheets:  ? ?ACT:  ?No flowsheet data found. ? ?MMRC: ?No flowsheet data found. ? ?[facility]worth:  ?No flowsheet data found. ? ?Tests:  ? ?FENO:  ?No results found for: NITRICOXIDE ? ?PFT: ?PFT Results Latest Ref Rng & Units 02/06/2017  ?FVC-Pre L 2.14  ?FVC-Predicted Pre % 72  ?FVC-Post L 2.22  ?FVC-Predicted Post % 74  ?Pre FEV1/FVC % % 60  ?Post FEV1/FCV % % 59  ?FEV1-Pre L 1.28  ?FEV1-Predicted Pre % 56  ?FEV1-Post L 1.30  ?DLCO uncorrected ml/min/mmHg 12.45  ?  DLCO UNC% % 54  ?DLCO corrected ml/min/mmHg 12.34  ?DLCO COR %Predicted % 53  ?DLVA Predicted % 66  ?TLC L 4.65  ?TLC % Predicted % 94  ?RV % Predicted % 107  ?Personally reviewed and interpreted 2018 PFTs where spirometry is suggestive of moderate obstruction, no bronchodilator response, TLC within normal limits, DLCO moderately reduced ? ?WALK:  ?No flowsheet data found. ? ?Imaging: ?Personally reviewed and as per EMR discussed in this note ?No results found. ? ?Lab  Results: ?Personally reviewed ?CBC ?   ?Component Value Date/Time  ? WBC 8.4 06/25/2021 1148  ? RBC 3.49 (L) 06/25/2021 1148  ? HGB 11.7 (L) 06/25/2021 1148  ? HGB 11.9 05/22/2021 0842  ? HGB 13.1 01/13/2009 1108  ? HCT 34.1 (L) 06/25/2021 1148  ? HCT 35.3 05/22/2021 0842  ? HCT 37.5 01/13/2009 1108  ? PLT 207.0 06/25/2021 1148  ? PLT 194 05/22/2021 0842  ? MCV 97.7 06/25/2021 1148  ? MCV 92 05/22/2021 0842  ? MCV 86.2 01/13/2009 1108  ? MCH 31.2 05/22/2021 0842  ? MCH 31.5 04/25/2021 1246  ? MCHC 34.2 06/25/2021 1148  ? RDW 17.8 (H) 06/25/2021 1148  ? RDW 14.5 05/22/2021 0842  ? RDW 12.2 01/13/2009 1108  ? LYMPHSABS 0.4 (L) 06/25/2021 1148  ? LYMPHSABS 1.2 05/11/2020 1203  ? LYMPHSABS 2.2 01/13/2009 1108  ? MONOABS 0.4 06/25/2021 1148  ? MONOABS 0.7 01/13/2009 1108  ? EOSABS 0.0 06/25/2021 1148  ? EOSABS 0.1 05/11/2020 1203  ? BASOSABS 0.0 06/25/2021 1148  ? BASOSABS 0.0 05/11/2020 1203  ? BASOSABS 0.0 01/13/2009 1108  ? ? ?BMET ?   ?Component Value Date/Time  ? NA 130 (L) 06/25/2021 1148  ? NA 130 (L) 06/18/2021 1004  ? K 4.5 06/25/2021 1148  ? CL 93 (L) 06/25/2021 1148  ? CO2 29 06/25/2021 1148  ? GLUCOSE 164 (H) 06/25/2021 1148  ? BUN 20 06/25/2021 1148  ? BUN 19 06/18/2021 1004  ? CREATININE 1.47 (H) 06/25/2021 1148  ? CREATININE 0.91 11/22/2019 1502  ? CALCIUM 9.0 06/25/2021 1148  ? GFRNONAA 57 (L) 04/26/2021 0407  ? GFRAA 89 05/11/2020 1203  ? ? ?BNP ?   ?Component Value Date/Time  ? BNP 533.7 (H) 04/23/2021 1041  ? ? ?ProBNP ?   ?Component Value Date/Time  ? PROBNP 1,279.0 (H) 03/21/2021 1206  ? ? ?Specialty Problems   ? ?  ? Pulmonary Problems  ? Cough  ?  Qualifier: Diagnosis of  ?By: Asa Lente MD, Jannifer Rodney ? ?  ?  ? Smokers' cough ([place])  ? COPD (chronic obstructive pulmonary disease) ([place])  ? Acute respiratory failure with hypoxia and hypercapnia ([facility])  ? Community acquired pneumonia  ? Interstitial lung disease ([place])  ? ? ?No Known Allergies ? ?Immunization History  ?Administered Date(s) Administered  ?  Fluad Quad(high Dose 65+) 03/29/2019  ? H1N1 05/31/2008  ? Influenza Split 02/13/2012  ? Influenza Whole 01/21/2008, 01/17/2009  ? Influenza, High Dose Seasonal PF 02/19/2016, 02/05/2018  ? Influenza,inj,Quad PF,6+ Mos 01/21/2014  ? Influenza-Unspecified 02/02/2015, 02/03/2017  ? Pneumococcal Conjugate-13 02/19/2016  ? Pneumococcal Polysaccharide-23 02/24/2017  ? Tdap 03/29/2019  ? ? ?Past Medical History:  ?Diagnosis Date  ? Acute respiratory failure with hypoxia ([place]) 11/05/2017  ? Arthritis   ? Colitis, ischemic ([place])   ? COPD (chronic obstructive pulmonary disease) ([place])   ? External hemorrhoids   ? H/O: rheumatic fever   ? IBS (irritable bowel syndrome)   ? Personal history colonic adenoma 03/25/2008  ?  06/2007 right sided adenoma and 2 right hyperplastic polyps    ? Restless leg syndrome   ? questionable  ? Systemic lupus erythematosus ([place])   ? Tubular adenoma of colon   ? 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 ? ?Tobacco History: ?Social History  ? ?Tobacco Use  ?Smoking Status Former  ? Packs/day: 0.25  ? Years: 30.00  ? Pack years: 7.50  ? Types: Cigarettes  ? Quit date: 01/21/2020  ? Years since quitting: 1.4  ?Smokeless Tobacco Never  ?Tobacco Comments  ? smokes "every now and then" 08/24/20 [facility]   ? ?Counseling given: Not Answered ?Tobacco comments: smokes "every now and then" 08/24/20 [facility]  ? ? ?Continue to not smoke ? ?Outpatient Encounter Medications as of 07/03/2021  ?Medication Sig  ? albuterol (VENTOLIN HFA) 108 (90 Base) MCG/ACT inhaler Inhale 1-2 puffs into the lungs every 6 (six) hours as needed for wheezing or shortness of breath.  ? amiodarone (PACERONE) 200 MG tablet Take 1 tablet (200 mg total) by mouth daily.  ? ANORO ELLIPTA 62.5-25 MCG/ACT AEPB INHALE 1 PUFF BY MOUTH EVERY DAY  ? benzonatate (TESSALON) 200 MG capsule Take 1 capsule (200 mg total) by mouth every 6 (six) hours as needed for cough.  ? calcium carbonate (OS-CAL - DOSED IN MG OF ELEMENTAL CALCIUM) 1250 (500 Ca) MG tablet Take 1  tablet by mouth every evening.  ? Cholecalciferol (VITAMIN D) 50 MCG (2000 UT) tablet Take 2,000 Units by mouth every evening.  ? folic acid (FOLVITE) 1 MG tablet Take 1 mg by mouth every evening.  ? furosemide (LASIX) 20

## 2021-07-03 NOTE — Patient Instructions (Signed)
We will get a chest x-ray today, we will make a plan for the prednisone after that ? ?Continue your current dose of prednisone, should be in touch with you by the end of the week ? ?Please contact Dr. Posey Pronto with neurology for further evaluation of some of the imbalance ? ?Return to clinic in 6 weeks or sooner as needed with Dr. Silas Flood ?

## 2021-07-04 ENCOUNTER — Encounter: Payer: Self-pay | Admitting: *Deleted

## 2021-07-04 NOTE — Telephone Encounter (Signed)
I see no reason she cannot travel however she will need ongoing monitoring or her health prior to travel especially with her sodium levels.  ?

## 2021-07-05 ENCOUNTER — Telehealth: Payer: Self-pay | Admitting: Pulmonary Disease

## 2021-07-05 NOTE — Telephone Encounter (Signed)
A message was sent to [place] already on this issue. Will close this encounter due to it already being addressed by MH. ? ?

## 2021-07-05 NOTE — Progress Notes (Signed)
Chest xray appears worse than prior - could be extra fluid but weight appears stable. Fear the non-infectious pneumonia we are treating with prednisone is getting worse with lower doses of prednisone. Want to make sure she is taking the Bactrim three times a week - can you ask? We likely need to increase prednisone dose.

## 2021-07-06 ENCOUNTER — Telehealth: Payer: Self-pay | Admitting: Pulmonary Disease

## 2021-07-06 ENCOUNTER — Other Ambulatory Visit (INDEPENDENT_AMBULATORY_CARE_PROVIDER_SITE_OTHER): Payer: PPO

## 2021-07-06 DIAGNOSIS — I5032 Chronic diastolic (congestive) heart failure: Secondary | ICD-10-CM

## 2021-07-06 LAB — BRAIN NATRIURETIC PEPTIDE: Pro B Natriuretic peptide (BNP): 1971 pg/mL — ABNORMAL HIGH (ref 0.0–100.0)

## 2021-07-06 MED ORDER — SULFAMETHOXAZOLE-TRIMETHOPRIM 400-80 MG PO TABS: 1.0000 | ORAL_TABLET | ORAL | 0 refills | Status: DC

## 2021-07-06 NOTE — Progress Notes (Signed)
Go back to 10 mg ? ?If needs refills that is fine to send ?

## 2021-07-06 NOTE — Progress Notes (Signed)
Yes continue bactrim while on prednisone. Thanks!

## 2021-07-06 NOTE — Progress Notes (Signed)
Let's keep prednisone the same - if she symptoms worsen encourage her to go to 20 mg daily and call us to let us know. Keeping it the same gives [place] the best chance of getting most information with bronchoscopy. ? ?BNP today - will order ? ?Will message about setting up bronchoscopy tentative plan Tuesday. Bronchoscopy ordered. Asked for 07/10/21 with last dose apixaban 3/18 PM.

## 2021-07-06 NOTE — Progress Notes (Signed)
The prednisone is best thing to help symptoms quickly, like we discussed we will work to get dose down in the coming weeks. 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  ? ?Alternatively, we could get labs today to see if the changes on xray could be related to fluid retention. We could also pursue bronchoscopy early next week to see if we can prove the eosinophils are in the lung.

## 2021-07-06 NOTE — Telephone Encounter (Signed)
Contacted patient husband.  BNP today elevated to 1900 from 500 during recent admission.  Discussed worsening symptoms and x-ray changes could be related to volume overload.  Recommended increasing Lasix from 20 mg daily to twice daily for next 3 days.  We will repeat labs early in the week, Monday, these were ordered.  He is to contact us Monday let us know how patient responds in terms of dyspnea with increased Lasix dose.  We will make final plan for bronchoscopy on Monday. Think bronchoscopy will be needed at some point to evaluate if eosinophils are actually present. Will hold prednisone dose at 10 mg daily. Continue bactrim thrice weekly for PJP ppx.  ? ?Routing to RN as Juluis Rainier, routing to cardiologist to inform of temporary diuretic change. ?

## 2021-07-08 NOTE — Telephone Encounter (Signed)
Thanks! Yeah the reference range for the BNP and pro-BNP are the same which makes me think despite different names both are just a BNP. Either way - will see how she is feeling Monday and follow up labs!

## 2021-07-09 ENCOUNTER — Telehealth: Payer: Self-pay | Admitting: Pulmonary Disease

## 2021-07-09 ENCOUNTER — Other Ambulatory Visit (INDEPENDENT_AMBULATORY_CARE_PROVIDER_SITE_OTHER): Payer: PPO

## 2021-07-09 DIAGNOSIS — I5032 Chronic diastolic (congestive) heart failure: Secondary | ICD-10-CM

## 2021-07-09 LAB — BASIC METABOLIC PANEL
BUN: 22 mg/dL (ref 6–23)
CO2: 30 mEq/L (ref 19–32)
Calcium: 9.4 mg/dL (ref 8.4–10.5)
Chloride: 91 mEq/L — ABNORMAL LOW (ref 96–112)
Creatinine, Ser: 1.4 mg/dL — ABNORMAL HIGH (ref 0.40–1.20)
GFR: 37.8 mL/min — ABNORMAL LOW (ref >60.00)
Glucose, Bld: 121 mg/dL — ABNORMAL HIGH (ref 70–99)
Potassium: 3.9 mEq/L (ref 3.5–5.1)
Sodium: 130 mEq/L — ABNORMAL LOW (ref 135–145)

## 2021-07-09 LAB — BRAIN NATRIURETIC PEPTIDE: Pro B Natriuretic peptide (BNP): 1033 pg/mL — ABNORMAL HIGH (ref 0.0–100.0)

## 2021-07-09 NOTE — Progress Notes (Signed)
Called and discussed with patient.  Feeling better after 3 days of increased Lasix dosing.  Labs reviewed this afternoon, BNP decreased by nearly half, creatinine and sodium stable. ? ?She states she is feeling better.  At first endorsed only mild improvement in her dyspnea.  Other overall she has more energy, feels more like herself to use her own words.  We discussed that given the improvement with diuresis, her most recent chest x-ray may just reveal interstitial edema as opposed to worsening inflammatory pneumonia.  To decrease prednisone to 5 mg daily to continue taper.  Can resume Eliquis.  Do think bronchoscopy is warranted in the future but ideally would be done off prednisone to give [place] most accurate cell counts.  Okay to return to daily Lasix dosing.  Suspect she will need increased doses of Lasix in the near future however. ? ?Routing to nurse as Juluis Rainier.

## 2021-07-09 NOTE — Telephone Encounter (Signed)
Can resume Eliquis 5 mg twice daily. No need for stronger antibiotic.

## 2021-07-09 NOTE — Telephone Encounter (Signed)
Called patient and she is trying to get clarification for her Eliquis. Should she go back to twice a day? ? ?And she wants to know if she requires a stronger antibiotic? ? ?[place] please advise  ?

## 2021-07-09 NOTE — Telephone Encounter (Signed)
Called and left detailed message per DPR to resume Eliquis '5mg'$  twice a day and no need for a stronger antibiotic at this time and to resume her current antibiotic. Nothing further needed  ?

## 2021-07-11 DIAGNOSIS — Z79899 Other long term (current) drug therapy: Secondary | ICD-10-CM

## 2021-07-11 DIAGNOSIS — I5032 Chronic diastolic (congestive) heart failure: Secondary | ICD-10-CM

## 2021-07-12 MED ORDER — POTASSIUM CHLORIDE CRYS ER 20 MEQ PO TBCR: 20.0000 meq | EXTENDED_RELEASE_TABLET | Freq: Every day | ORAL | 2 refills | Status: DC

## 2021-07-12 NOTE — Telephone Encounter (Signed)
Also, lets have her start potassium 20 mEq daily with increased dose of Lasix. Thank you. Raquel Sarna   ? ?Lenna Sciara, NP  You; Elouise Munroe, MD 48 minutes ago (4:20 PM)  ? ?Given her BNP is still elevated, and in setting of weight gain, I would recommend increasing Lasix to 40 mg daily for now. Especially, given that she seems to feel better when she takes the Lasix.  Continue to monitor daily weights/BP/symptoms.  Recommend repeat BMET/BNP in 1 week.  Follow-up with Dr. [name] as scheduled. Thank you.   ? ? ?Called left message and sent message to patient. New prescription and lab slip placed in computer system  ?

## 2021-07-12 NOTE — Telephone Encounter (Signed)
Sent this message   ? ?Good Morning Ms Guyette ?  I will send this message to [facility] and Dr [name]. Also , have you reached out to Dr Silas Flood  who initial instructed you to take increase your lasix ?Ivin Booty RN  ?

## 2021-07-18 ENCOUNTER — Other Ambulatory Visit: Payer: Self-pay | Admitting: Emergency Medicine

## 2021-07-19 DIAGNOSIS — Z79899 Other long term (current) drug therapy: Secondary | ICD-10-CM | POA: Diagnosis not present

## 2021-07-19 DIAGNOSIS — I5032 Chronic diastolic (congestive) heart failure: Secondary | ICD-10-CM | POA: Diagnosis not present

## 2021-07-19 LAB — BASIC METABOLIC PANEL
BUN/Creatinine Ratio: 16 (ref 12–28)
BUN: 24 mg/dL (ref 8–27)
CO2: 27 mmol/L (ref 20–29)
Calcium: 9.4 mg/dL (ref 8.7–10.3)
Chloride: 96 mmol/L (ref 96–106)
Creatinine, Ser: 1.53 mg/dL — ABNORMAL HIGH (ref 0.57–1.00)
Glucose: 82 mg/dL (ref 70–99)
Potassium: 5.2 mmol/L (ref 3.5–5.2)
Sodium: 134 mmol/L (ref 134–144)
eGFR: 36 mL/min/{1.73_m2} — ABNORMAL LOW (ref >59)

## 2021-07-20 LAB — BRAIN NATRIURETIC PEPTIDE: BNP: 899.7 pg/mL — ABNORMAL HIGH (ref 0.0–100.0)

## 2021-07-20 NOTE — Telephone Encounter (Signed)
Response sent by pt: ?[facility] (supporting Hunsucker, Bonna Gains, MD) 25 minutes ago (10:57 AM)  ? ?My breathing is better. I still get winded if I don?t pace myself or take a break, but overall I would say my breathing is almost normal before the CHF on 11/30. ?Thank you ?Daisia ? ? ? ? ?Routing to Dr. Silas Flood. ?

## 2021-07-26 ENCOUNTER — Telehealth: Payer: PPO

## 2021-07-27 ENCOUNTER — Other Ambulatory Visit: Payer: Self-pay | Admitting: Internal Medicine

## 2021-07-28 ENCOUNTER — Other Ambulatory Visit: Payer: Self-pay | Admitting: Internal Medicine

## 2021-07-29 ENCOUNTER — Emergency Department (HOSPITAL_BASED_OUTPATIENT_CLINIC_OR_DEPARTMENT_OTHER): Payer: PPO

## 2021-07-29 ENCOUNTER — Other Ambulatory Visit: Payer: Self-pay

## 2021-07-29 ENCOUNTER — Emergency Department (HOSPITAL_BASED_OUTPATIENT_CLINIC_OR_DEPARTMENT_OTHER)
Admission: EM | Admit: 2021-07-29 | Discharge: 2021-07-29 | Disposition: A | Discharge status: Home / Self Care | Payer: PPO | Attending: Emergency Medicine | Admitting: Emergency Medicine

## 2021-07-29 ENCOUNTER — Encounter (HOSPITAL_BASED_OUTPATIENT_CLINIC_OR_DEPARTMENT_OTHER): Payer: Self-pay

## 2021-07-29 DIAGNOSIS — S2232XA Fracture of one rib, left side, initial encounter for closed fracture: Secondary | ICD-10-CM | POA: Diagnosis not present

## 2021-07-29 DIAGNOSIS — R519 Headache, unspecified: Secondary | ICD-10-CM | POA: Diagnosis not present

## 2021-07-29 DIAGNOSIS — S2239XA Fracture of one rib, unspecified side, initial encounter for closed fracture: Secondary | ICD-10-CM

## 2021-07-29 DIAGNOSIS — R0689 Other abnormalities of breathing: Secondary | ICD-10-CM | POA: Diagnosis not present

## 2021-07-29 DIAGNOSIS — W19XXXA Unspecified fall, initial encounter: Secondary | ICD-10-CM | POA: Insufficient documentation

## 2021-07-29 DIAGNOSIS — Z7901 Long term (current) use of anticoagulants: Secondary | ICD-10-CM | POA: Insufficient documentation

## 2021-07-29 DIAGNOSIS — S0093XA Contusion of unspecified part of head, initial encounter: Secondary | ICD-10-CM | POA: Diagnosis not present

## 2021-07-29 DIAGNOSIS — K5730 Diverticulosis of large intestine without perforation or abscess without bleeding: Secondary | ICD-10-CM | POA: Diagnosis not present

## 2021-07-29 DIAGNOSIS — S0990XA Unspecified injury of head, initial encounter: Secondary | ICD-10-CM | POA: Diagnosis present

## 2021-07-29 DIAGNOSIS — S32009A Unspecified fracture of unspecified lumbar vertebra, initial encounter for closed fracture: Secondary | ICD-10-CM

## 2021-07-29 DIAGNOSIS — N3289 Other specified disorders of bladder: Secondary | ICD-10-CM | POA: Diagnosis not present

## 2021-07-29 DIAGNOSIS — R531 Weakness: Secondary | ICD-10-CM | POA: Diagnosis not present

## 2021-07-29 DIAGNOSIS — R109 Unspecified abdominal pain: Secondary | ICD-10-CM | POA: Diagnosis not present

## 2021-07-29 LAB — CBC WITH DIFFERENTIAL/PLATELET
Abs Immature Granulocytes: 0.03 10*3/uL (ref 0.00–0.07)
Basophils Absolute: 0 10*3/uL (ref 0.0–0.1)
Basophils Relative: 0 %
Eosinophils Absolute: 0.1 10*3/uL (ref 0.0–0.5)
Eosinophils Relative: 1 %
HCT: 35.5 % — ABNORMAL LOW (ref 36.0–46.0)
Hemoglobin: 11.8 g/dL — ABNORMAL LOW (ref 12.0–15.0)
Immature Granulocytes: 0 %
Lymphocytes Relative: 14 %
Lymphs Abs: 1 10*3/uL (ref 0.7–4.0)
MCH: 31.9 pg (ref 26.0–34.0)
MCHC: 33.2 g/dL (ref 30.0–36.0)
MCV: 95.9 fL (ref 80.0–100.0)
Monocytes Absolute: 0.6 10*3/uL (ref 0.1–1.0)
Monocytes Relative: 8 %
Neutro Abs: 5.4 10*3/uL (ref 1.7–7.7)
Neutrophils Relative %: 77 %
Platelets: 208 10*3/uL (ref 150–400)
RBC: 3.7 MIL/uL — ABNORMAL LOW (ref 3.87–5.11)
RDW: 15.4 % (ref 11.5–15.5)
WBC: 7 10*3/uL (ref 4.0–10.5)
nRBC: 0 % (ref 0.0–0.2)

## 2021-07-29 LAB — COMPREHENSIVE METABOLIC PANEL
ALT: 30 U/L (ref 0–44)
AST: 32 U/L (ref 15–41)
Albumin: 3.9 g/dL (ref 3.5–5.0)
Alkaline Phosphatase: 65 U/L (ref 38–126)
Anion gap: 8 (ref 5–15)
BUN: 17 mg/dL (ref 8–23)
CO2: 29 mmol/L (ref 22–32)
Calcium: 9.1 mg/dL (ref 8.9–10.3)
Chloride: 93 mmol/L — ABNORMAL LOW (ref 98–111)
Creatinine, Ser: 1.31 mg/dL — ABNORMAL HIGH (ref 0.44–1.00)
GFR, Estimated: 44 mL/min — ABNORMAL LOW (ref >60)
Glucose, Bld: 94 mg/dL (ref 70–99)
Potassium: 4.2 mmol/L (ref 3.5–5.1)
Sodium: 130 mmol/L — ABNORMAL LOW (ref 135–145)
Total Bilirubin: 0.4 mg/dL (ref 0.3–1.2)
Total Protein: 6.8 g/dL (ref 6.5–8.1)

## 2021-07-29 LAB — BRAIN NATRIURETIC PEPTIDE: B Natriuretic Peptide: 757.9 pg/mL — ABNORMAL HIGH (ref 0.0–100.0)

## 2021-07-29 MED ORDER — OXYCODONE-ACETAMINOPHEN 7.5-325 MG PO TABS: 1.0000 | ORAL_TABLET | Freq: Four times a day (QID) | ORAL | 0 refills | Status: DC | PRN

## 2021-07-29 MED ORDER — OXYCODONE-ACETAMINOPHEN 5-325 MG PO TABS
2.0000 | ORAL_TABLET | Freq: Once | ORAL | Status: AC
  Administered 2021-07-29: 2 via ORAL
  Filled 2021-07-29: qty 2

## 2021-07-29 NOTE — ED Notes (Signed)
Patient transported to CT 

## 2021-07-29 NOTE — ED Triage Notes (Signed)
Patient here POV from Home. ? ?Endorses falling last PM from tripping backwards in the house. Pain initially to Head. Patient fell again today from tripping. ? ?Patient does take Eliquis. No LOC. Endorses Pain to Head and Lower Back. ? ?NAD Noted during Triage. A&Ox4. GCS 15. BIB Wheelchair. ?

## 2021-07-29 NOTE — ED Provider Notes (Signed)
?[facility] ?Provider Note ? ? ?CSN: [id number] ?Arrival date & time: 07/29/21  1807 ? ?  ? ?History ? ?Chief Complaint  ?Patient presents with  ? Fall  ? ? ?Sheila Morales is a 71 y.o. female. ? ?71 year old female presents had mechanical fall just prior to arrival.  Patient has had trouble with weakness for some time.  Weakness has been nonfocal.  Has been feels that this occurred because patient pivoted too quickly.  She denies any associated shortness of breath or chest pain.  No abdominal discomfort.  No headache.  No new medications.  No treatment use prior to arrival.  This is patient's second fall since the past 24 hours.  She fell yesterday as well.  Did not have any LOC.  She is on Eliquis but did not seek any care. ? ? ?  ? ?Home Medications ?Prior to Admission medications   ?Medication Sig Start Date End Date Taking? Authorizing Provider  ?albuterol (VENTOLIN HFA) 108 (90 Base) MCG/ACT inhaler Inhale 1-2 puffs into the lungs every 6 (six) hours as needed for wheezing or shortness of breath. 10/13/20   Collene Gobble, MD  ?amiodarone (PACERONE) 200 MG tablet Take 1 tablet (200 mg total) by mouth daily. 06/15/21 07/15/21  O'NealCassie Freer, MD  ?apixaban (ELIQUIS) 5 MG TABS tablet Take 1 tablet (5 mg total) by mouth 2 (two) times daily. 05/01/21 05/31/21  Elouise Munroe, MD  ?benzonatate (TESSALON) 200 MG capsule Take 1 capsule (200 mg total) by mouth every 6 (six) hours as needed for cough. 03/20/21   Collene Gobble, MD  ?calcium carbonate (OS-CAL - DOSED IN MG OF ELEMENTAL CALCIUM) 1250 (500 Ca) MG tablet Take 1 tablet by mouth every evening.    [provider]  ?Cholecalciferol (VITAMIN D) 50 MCG (2000 UT) tablet Take 2,000 Units by mouth every evening.    [provider]  ?folic acid (FOLVITE) 1 MG tablet Take 1 mg by mouth every evening.    [provider]  ?furosemide (LASIX) 20 MG tablet Take 1 tablet (20 mg) by mouth daily, take 1 extra tablet  by mouth (20 mg) as needed for increased swelling. 06/15/21   O'Neal, Cassie Freer, MD  ?gabapentin (NEURONTIN) 300 MG capsule TAKE TWO CAPSULES BY MOUTH EVERY MORNING, TWO CAPSULES EACH AFTERNOON AND THREE CAPSULES AT BEDTIME ?Patient taking differently: Take 600-900 mg by mouth See admin instructions. 02/27/21   Hoyt Koch, MD  ?hydroxychloroquine (PLAQUENIL) 200 MG tablet Take 200 mg by mouth daily.    [provider]  ?methotrexate 2.5 MG tablet Take 10 mg by mouth every Thursday.    [provider]  ?metoprolol tartrate (LOPRESSOR) 25 MG tablet Take 1 tablet (25 mg total) by mouth 2 (two) times daily. 05/01/21 05/31/21  Elouise Munroe, MD  ?Misc Natural Products (ADVANCED JOINT RELIEF) CAPS Take 1 capsule by mouth daily. Joint Advantage Gold 5x    [provider]  ?Multiple Vitamin (MULTIVITAMIN WITH MINERALS) TABS tablet Take 1 tablet by mouth daily.    [provider]  ?potassium chloride SA (KLOR-CON M20) 20 MEQ tablet Take 1 tablet (20 mEq total) by mouth daily. Take with Lasix 40 mg daily 07/12/21 10/10/21  Lenna Sciara, NP  ?RESTASIS 0.05 % ophthalmic emulsion Place 1 drop into both eyes 2 (two) times daily. 07/30/17   [provider]  ?sodium chloride 1 g tablet Take 1 tablet (1 g total) by mouth 3 (three) times daily  with meals. ?Patient taking differently: Take 1 g by mouth 2 (two) times daily with a meal. 04/26/21   Shawna Clamp, MD  ?sulfamethoxazole-trimethoprim (BACTRIM) 400-80 MG tablet Take 1 tablet by mouth 3 (three) times a week. 07/06/21   Hunsucker, Bonna Gains, MD  ?umeclidinium-vilanterol [facility] ELLIPTA) 62.5-25 MCG/ACT AEPB INHALE 1 PUFF BY MOUTH EVERY DAY 07/18/21   Collene Gobble, MD  ?vitamin C (ASCORBIC ACID) 500 MG tablet Take 500 mg by mouth daily.    [provider]  ?vitamin E 180 MG (400 UNITS) capsule Take 400 Units by mouth daily.    [provider]  ?zolpidem (AMBIEN) 5 MG tablet TAKE 1 TABLET BY MOUTH AT  BEDTIME AS NEEDED FOR SLEEP. 05/11/21   Hoyt Koch, MD  ?   ? ?Allergies    ?Patient has no known allergies.   ? ?Review of Systems   ?Review of Systems  ?All other systems reviewed and are negative. ? ?Physical Exam ?Updated Vital Signs ?BP (!) 151/105 (BP Location: Left Arm)   Pulse 64   Temp 98.2 ?F (36.8 ?C)   Resp 18   Ht 1.575 m ('5\' 2"'$ )   Wt 61.7 kg   SpO2 100%   BMI 24.88 kg/m?  ?Physical Exam ?Vitals and nursing note reviewed.  ?Constitutional:   ?   General: She is not in acute distress. ?   Appearance: Normal appearance. She is well-developed. She is not toxic-appearing.  ?HENT:  ?   Head:  ? ?Eyes:  ?   General: Lids are normal.  ?   Conjunctiva/sclera: Conjunctivae normal.  ?   Pupils: Pupils are equal, round, and reactive to light.  ?Neck:  ?   Thyroid: No thyroid mass.  ?   Trachea: No tracheal deviation.  ?Cardiovascular:  ?   Rate and Rhythm: Normal rate and regular rhythm.  ?   Heart sounds: Normal heart sounds. No murmur heard. ?  No gallop.  ?Pulmonary:  ?   Effort: Pulmonary effort is normal. No respiratory distress.  ?   Breath sounds: Normal breath sounds. No stridor. No decreased breath sounds, wheezing, rhonchi or rales.  ?Abdominal:  ?   General: There is no distension.  ?   Palpations: Abdomen is soft.  ?   Tenderness: There is no abdominal tenderness. There is no rebound.  ?Musculoskeletal:     ?   General: No tenderness. Normal range of motion.  ?   Cervical back: Normal range of motion and neck supple.  ?Skin: ?   General: Skin is warm and dry.  ?   Findings: No abrasion or rash.  ?Neurological:  ?   Mental Status: She is alert and oriented to person, place, and time. Mental status is at baseline.  ?   GCS: GCS eye subscore is 4. GCS verbal subscore is 5. GCS motor subscore is 6.  ?   Cranial Nerves: No cranial nerve deficit.  ?   Sensory: No sensory deficit.  ?   Motor: Motor function is intact.  ?Psychiatric:     ?   Attention and Perception: Attention normal.     ?    Speech: Speech normal.     ?   Behavior: Behavior normal.  ? ? ?ED Results / Procedures / Treatments   ?Labs ?(all labs ordered are listed, but only abnormal results are displayed) ?Labs Reviewed  ?CBC WITH DIFFERENTIAL/PLATELET  ?COMPREHENSIVE METABOLIC PANEL  ?BRAIN NATRIURETIC PEPTIDE  ? ? ?EKG ?EKG Interpretation ? ?Date/Time:  Sunday July 29 2021 19:13:04 EDT ?Ventricular Rate:  66 ?PR Interval:  96 ?QRS Duration: 101 ?QT Interval:  438 ?QTC Calculation: 459 ?R Axis:   41 ?Text Interpretation: Atrial-paced rhythm Borderline T abnormalities, lateral leads Confirmed by Lacretia Leigh ([id number]) on 07/29/2021 10:58:13 PM ? ?Radiology ?No results found. ? ?Procedures ?Procedures  ? ? ?Medications Ordered in ED ?Medications - No data to display ? ?ED Course/ Medical Decision Making/ A&P ?  ?                        ?Medical Decision Making ?Amount and/or Complexity of Data Reviewed ?Labs: ordered. ?Radiology: ordered. ?ECG/medicine tests: ordered. ? ?Risk ?Prescription drug management. ? ?Patient's EKG per my interpretation shows a paced rhythm.  No signs of acute ischemic changes noted. ?Patient presented after fall at home and had CT of her head which per my review and interpretation did not show any acute bleeding.  She is on Eliquis.  She also complained of having some pain to her left flank.  Concern for possible retroperitoneal hemorrhage and she had renal CT was did not show that but did show to transverse process fractures as well as nondisplaced fracture of her left 11th rib per my review and interpretation.  She was medicated here with Percocet and feels much better.  Her hemoglobin was stable here.  Family had requested a BNP which was performed due to patient's history of chronic heart failure.  Her value today is lower than it has been in the past according to the husband who is at bedside.  Plan will be for patient to be discharged home with prescription for pain medication return  precautions ? ? ? ? ? ? ? ?Final Clinical Impression(s) / ED Diagnoses ?Final diagnoses:  ?None  ? ? ?Rx / DC Orders ?ED Discharge Orders   ? ? None  ? ?  ? ? ?  ?Lacretia Leigh, MD ?07/29/21 2258 ? ?

## 2021-08-03 ENCOUNTER — Ambulatory Visit: Payer: PPO | Admitting: Nurse Practitioner

## 2021-08-04 ENCOUNTER — Telehealth: Payer: Self-pay | Admitting: Student

## 2021-08-04 DIAGNOSIS — I480 Paroxysmal atrial fibrillation: Secondary | ICD-10-CM

## 2021-08-04 MED ORDER — APIXABAN 5 MG PO TABS: 5.0000 mg | ORAL_TABLET | Freq: Two times a day (BID) | ORAL | 3 refills | Status: DC

## 2021-08-04 NOTE — Telephone Encounter (Signed)
? ?  Patient called Answering Service requesting Eliquis refill. Called and spoke with patient. She states she has been trying to get a refill for the past couple of days. She has 1 tablet of Eliquis left. Will send in refill of Eliquis '5mg'$  twice daily to requested pharmacy. ? ?Darreld Mclean, PA-C ?08/04/2021 11:44 AM ? ? ?

## 2021-08-06 ENCOUNTER — Ambulatory Visit: Payer: PPO | Admitting: Neurology

## 2021-08-06 ENCOUNTER — Encounter: Payer: Self-pay | Admitting: Pulmonary Disease

## 2021-08-06 NOTE — Telephone Encounter (Signed)
Dr. Hunsucker, please see mychart messages sent by pt and advise. 

## 2021-08-06 NOTE — Progress Notes (Signed)
? ?NEUROLOGY FOLLOW UP OFFICE NOTE ? ?Sheila Morales ?[id number] ? ?Assessment/Plan:  ? ?Balance disorder with frequent falls ?Tremor ?Memory deficits ?Symptoms may still potentially be residual from recent illnesses.  Recent decline may be related to tapering of the prednisone which could have played a role in feeling well.  I think we should evaluate for other etiologies. ? ?MRI of brain without contrast.   ?Check TSH, B1 and B12 ?Further recommendations pending results.  Patient slightly brisk on exam, so if testing unremarkable, consider MRI of c-spine ?Follow up with Dr. Posey Pronto. ? ?Total time spent face to face with patient and reviewing notes:  61 minutes ? ?Subjective:  ?Sheila Morales is a 71 year old female with COPD, SLE, sick sinus syndrome s/p pacemaker, paroxysmal atrial fibrillation and arthritis who sees Dr. Posey Pronto for neuropathy presents today for weakness and falls.  She is accompanied by her husband who supplements history.  ED note reviewed. ? 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  As a result, she became encephalopathic and weak.  She was readmitted for encephalopathy due to hyponatremia secondary to SIADH and ongoing infection.  She was placed on Bactrim and prednisone and started to improve.  She felt stronger.  She started tapering off of her prednisone over the past month.  About 4 weeks ago, she started having increased gait instability.  She denies weakness and dizziness.  Rather, she can't keep her balance. She has had 3 falls over the past month..  She was seen in the ED on 07/29/2021 after a all in which she fractured ribs.  CT head personally reviewed showed scalp hematoma and chronic small vessel ischemic changes and atrophy but no acute intracranial abnormality.  She used to use the treadmill often.  She now requires a walker.  At one point she did have slurred speech for a  few days but no lateralizing symptoms such as facial droop or unilateral numbness or weakness.  Since hospital discharge, she has been more tremulous.  With use of her hands, her hands will tremble.  When her husband holds her hand, he can feel her tremor.  If she stands up, her hands and legs may start shaking a bit.  She also notes some more memory problems.  She says her neuropathy is stable.  Has 4-6 oz glass of wine every night. ? ?04/23/2021 TSH 1.577, ammonia 26 ? ?PAST MEDICAL HISTORY: ?Past Medical History:  ?Diagnosis Date  ? Acute respiratory failure with hypoxia ([place]) 11/05/2017  ? Arthritis   ? Colitis, ischemic ([place])   ? COPD (chronic obstructive pulmonary disease) ([place])   ? External hemorrhoids   ? H/O: rheumatic fever   ? IBS (irritable bowel syndrome)   ? Personal history colonic adenoma 03/25/2008  ? 06/2007 right sided adenoma and 2 right hyperplastic polyps    ? Restless leg syndrome   ? questionable  ? Systemic lupus erythematosus ([place])   ? Tubular adenoma of colon   ? 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 ? ?MEDICATIONS: ?Current Outpatient Medications on File Prior to Visit  ?Medication Sig Dispense Refill  ? albuterol (VENTOLIN HFA) 108 (90 Base) MCG/ACT inhaler Inhale 1-2 puffs into the lungs every 6 (six) hours as needed for wheezing or shortness of breath. 8 g 5  ? amiodarone (PACERONE) 200 MG tablet Take 1 tablet (200 mg total) by mouth daily. 180 tablet  0  ? apixaban (ELIQUIS) 5 MG TABS tablet Take 1 tablet (5 mg total) by mouth 2 (two) times daily. 180 tablet 3  ? benzonatate (TESSALON) 200 MG capsule Take 1 capsule (200 mg total) by mouth every 6 (six) hours as needed for cough. 30 capsule 1  ? calcium carbonate (OS-CAL - DOSED IN MG OF ELEMENTAL CALCIUM) 1250 (500 Ca) MG tablet Take 1 tablet by mouth every evening.    ? Cholecalciferol (VITAMIN D) 50 MCG (2000 UT) tablet Take 2,000 Units by mouth every evening.    ? folic acid (FOLVITE) 1 MG tablet Take 1 mg by mouth every evening.     ? furosemide (LASIX) 20 MG tablet Take 1 tablet (20 mg) by mouth daily, take 1 extra tablet by mouth (20 mg) as needed for increased swelling. 90 tablet 0  ? gabapentin (NEURONTIN) 300 MG capsule TAKE TWO CAPSULES BY MOUTH EVERY MORNING, TWO CAPSULES EACH AFTERNOON AND THREE CAPSULES AT BEDTIME 630 capsule 0  ? hydroxychloroquine (PLAQUENIL) 200 MG tablet Take 200 mg by mouth daily.    ? methotrexate 2.5 MG tablet Take 10 mg by mouth every Thursday.    ? metoprolol tartrate (LOPRESSOR) 25 MG tablet Take 1 tablet (25 mg total) by mouth 2 (two) times daily. 180 tablet 0  ? Misc Natural Products (ADVANCED JOINT RELIEF) CAPS Take 1 capsule by mouth daily. Joint Advantage Gold 5x    ? Multiple Vitamin (MULTIVITAMIN WITH MINERALS) TABS tablet Take 1 tablet by mouth daily.    ? oxyCODONE-acetaminophen (PERCOCET) 7.5-325 MG tablet Take 1 tablet by mouth every 6 (six) hours as needed for severe pain. 15 tablet 0  ? potassium chloride SA (KLOR-CON M20) 20 MEQ tablet Take 1 tablet (20 mEq total) by mouth daily. Take with Lasix 40 mg daily 90 tablet 2  ? RESTASIS 0.05 % ophthalmic emulsion Place 1 drop into both eyes 2 (two) times daily.  6  ? sodium chloride 1 g tablet Take 1 tablet (1 g total) by mouth 3 (three) times daily with meals. (Patient taking differently: Take 1 g by mouth 2 (two) times daily with a meal.) 60 tablet 0  ? sulfamethoxazole-trimethoprim (BACTRIM) 400-80 MG tablet Take 1 tablet by mouth 3 (three) times a week. 30 tablet 0  ? umeclidinium-vilanterol (ANORO ELLIPTA) 62.5-25 MCG/ACT AEPB INHALE 1 PUFF BY MOUTH EVERY DAY 60 each 5  ? vitamin C (ASCORBIC ACID) 500 MG tablet Take 500 mg by mouth daily.    ? vitamin E 180 MG (400 UNITS) capsule Take 400 Units by mouth daily.    ? zolpidem (AMBIEN) 5 MG tablet TAKE 1 TABLET BY MOUTH AT BEDTIME AS NEEDED FOR SLEEP. 30 tablet 5  ? ?No current facility-administered medications on file prior to visit.  ? ? ?ALLERGIES: ?No Known Allergies ? ?FAMILY HISTORY: ?Family  History  ?Problem Relation Age of Onset  ? Stroke Mother   ?     Deceased, 57s  ? Atrial fibrillation Father   ? Colon cancer Father   ? Other Sister   ?     Pick's disease, deceased  ? Healthy Daughter   ? ? ?  ?Objective:  ?Blood pressure (!) 90/57, pulse 75, height '5\' 2"'$  (1.575 m), weight 135 lb 3.2 oz (61.3 kg), SpO2 93 %. ?General: No acute distress.  Patient appears well-groomed.   ?Head:  Normocephalic/atraumatic ?Eyes:  Fundi examined but not visualized ?Heart:  Regular rate and rhythm ?Neurological Exam: alert and oriented to person, place,  and time.  Speech fluent and not dysarthric, language intact.  CN II-XII intact. Bulk and tone normal, muscle strength 5/5 throughout.  Sensation to light touch intact.  Deep tendon reflexes slightly brisk throughout,Hoffman absent, Babinski absent.  Finger to nose testing intact.  Broad-based antalgic gait.  Romberg negative. ? ? ?Metta Clines, DO ? ?CC: Pricilla Holm, MD ? ? ? ? ? ? ?

## 2021-08-07 ENCOUNTER — Encounter: Payer: Self-pay | Admitting: Neurology

## 2021-08-07 ENCOUNTER — Telehealth: Payer: Self-pay | Admitting: Internal Medicine

## 2021-08-07 ENCOUNTER — Ambulatory Visit: Payer: PPO | Admitting: Neurology

## 2021-08-07 ENCOUNTER — Other Ambulatory Visit (INDEPENDENT_AMBULATORY_CARE_PROVIDER_SITE_OTHER): Payer: PPO

## 2021-08-07 ENCOUNTER — Encounter: Payer: Self-pay | Admitting: Internal Medicine

## 2021-08-07 VITALS — BP 90/57 | HR 75 | Ht 62.0 in | Wt 135.2 lb

## 2021-08-07 DIAGNOSIS — R251 Tremor, unspecified: Secondary | ICD-10-CM

## 2021-08-07 DIAGNOSIS — R270 Ataxia, unspecified: Secondary | ICD-10-CM

## 2021-08-07 DIAGNOSIS — R413 Other amnesia: Secondary | ICD-10-CM

## 2021-08-07 DIAGNOSIS — G629 Polyneuropathy, unspecified: Secondary | ICD-10-CM

## 2021-08-07 LAB — TSH: TSH: 1.86 u[IU]/mL (ref 0.35–5.50)

## 2021-08-07 NOTE — Telephone Encounter (Signed)
1.Medication Requested: methotrexate 2.5 MG tablet ? ?2. Pharmacy (Name, [facility]): [id number] [place] [place]? ?3. On Med List: Y ? ?4. Last Visit with PCP: 05-04-2021 ? ?5. Next visit date with PCP: n/a ? ? ?Agent: Please be advised that RX refills may take up to 3 business days. We ask that you follow-up with your pharmacy.  ?

## 2021-08-07 NOTE — Patient Instructions (Signed)
Will check MRI of brain  ?Will check B1, B12, TSH ?Further recommendations pending results.   ?

## 2021-08-08 ENCOUNTER — Ambulatory Visit: Payer: PPO | Admitting: Internal Medicine

## 2021-08-08 ENCOUNTER — Encounter: Payer: Self-pay | Admitting: Neurology

## 2021-08-08 ENCOUNTER — Encounter: Payer: Self-pay | Admitting: Internal Medicine

## 2021-08-08 VITALS — BP 126/68 | HR 69 | Ht 62.0 in | Wt 136.0 lb

## 2021-08-08 DIAGNOSIS — R0609 Other forms of dyspnea: Secondary | ICD-10-CM | POA: Diagnosis not present

## 2021-08-08 DIAGNOSIS — Z79899 Other long term (current) drug therapy: Secondary | ICD-10-CM | POA: Diagnosis not present

## 2021-08-08 DIAGNOSIS — D6869 Other thrombophilia: Secondary | ICD-10-CM | POA: Diagnosis not present

## 2021-08-08 DIAGNOSIS — I5033 Acute on chronic diastolic (congestive) heart failure: Secondary | ICD-10-CM | POA: Diagnosis not present

## 2021-08-08 DIAGNOSIS — I480 Paroxysmal atrial fibrillation: Secondary | ICD-10-CM

## 2021-08-08 MED ORDER — FUROSEMIDE 20 MG PO TABS: 20.0000 mg | ORAL_TABLET | Freq: Every day | ORAL | 0 refills | Status: DC

## 2021-08-08 NOTE — Patient Instructions (Addendum)
Medication Instructions:  ?START: LASIX '20mg'$  ONCE DAILY  ?STOP: POTASSIUM  ?*If you need a refill on your cardiac medications before your next appointment, please call your pharmacy* ? ?Lab Work: ?Please return for Blood Work ON Monday 4/24. No appointment needed, lab here at the office is open Monday-Friday from 8AM to 4PM and closed daily for lunch from 12:45-1:45.  ? ?If you have labs (blood work) drawn today and your tests are completely normal, you will receive your results only by: ?MyChart Message (if you have MyChart) OR ?A paper copy in the mail ?If you have any lab test that is abnormal or we need to change your treatment, we will call you to review the results. ? ?Testing/Procedures: ? ? ?You are scheduled for Cardiac MRI on ______________. Please arrive at the [facility] main entrance of [facility] at ________________ (30-45 minutes prior to test start time). ? ? ?[facility] ?[address] ?[place], [place] [address] ?[phone] ? ?Please take advantage of the free valet parking available at the MAIN entrance (A entrance). Proceed to the [facility] Radiology Department (First Floor). ?? ?Magnetic resonance imaging (MRI) is a painless test that produces images of the inside of the body without using Xrays. ? ?During an MRI, strong magnets and radio waves work together in a Research officer, political party to form detailed images.  ? ?MRI images may provide more details about a medical condition than X-rays, CT scans, and ultrasounds can provide. ? ?You may be given earphones to listen for instructions. ? ?You may eat a light breakfast and take medications as ordered with the exception of HCTZ (fluid pill, other). Please avoid stimulants for 12 hr prior to test. (Ie. Caffeine, nicotine, chocolate, or antihistamine medications) ? ?If a contrast material will be used, an IV will be inserted into one of your veins. Contrast material will be injected into your IV. It will leave your body through  your urine within a day. You may be told to drink plenty of fluids to help flush the contrast material out of your system. ? ?You will be asked to remove all metal, including: Watch, jewelry, and other metal objects including hearing aids, hair pieces and dentures. Also wearable glucose monitoring systems (ie. Freestyle Libre and Omnipods) (Braces and fillings normally are not a problem.) ? ? ?TEST WILL TAKE APPROXIMATELY 1 HOUR ? ?PLEASE NOTIFY SCHEDULING AT LEAST 24 HOURS IN ADVANCE IF YOU ARE UNABLE TO KEEP YOUR APPOINTMENT. ?[phone] ? ?Please call Marchia Bond, cardiac imaging nurse navigator with any questions/concerns. ?Marchia Bond RN Navigator Cardiac Imaging ?Gordy Clement RN Navigator Cardiac Imaging ?[facility] ?[phone] Office  ? ? ?Follow-Up: ?At [facility], you and your health needs are our priority.  As part of our continuing mission to provide you with exceptional heart care, we have created designated Provider Care Teams.  These Care Teams include your primary Cardiologist (physician) and Advanced Practice Providers (APPs -  Physician Assistants and Nurse Practitioners) who all work together to provide you with the care you need, when you need it. ? ?Your next appointment:   ?April 25th at 2:20PM  ? ?The format for your next appointment:   ?In Person ? ?Provider:   ?Elouise Munroe, MD   ? ?Other Instructions ? ?How to Prepare for Your Cardiac PET/CT Stress Test: ? ?1. Please do not take these medications before your test:  ? ?Medications that may interfere with the cardiac pharmacological stress agent (ex. nitrates  or beta-blockers) the day of the exam. ?Theophylline containing medications for 12 hours. ?Dipyridamole 48 hours prior to the test. ?Your remaining medications may be taken with water. ? ?2. Nothing to eat or drink, except water, 3 hours prior to arrival time.   ?NO caffeine/decaffeinated products, or chocolate 12 hours prior to arrival. ? ?3.  NO perfume, cologne or lotion ? ?4. Total time is 1 to 2 hours; you may want to bring reading material for the waiting time. ? ?5. Please report to Admitting at the [facility] Entrance 60 minutes early for your test. ? [facility] ? [place], [place] [address] ? ? ?IF YOU THINK YOU MAY BE PREGNANT, OR ARE NURSING PLEASE INFORM THE TECHNOLOGIST. ? ?In preparation for your appointment, medication and supplies will be purchased.  Appointment availability is limited, so if you need to cancel or reschedule, please call the Radiology Department at [phone]  24 hours in advance to avoid a cancellation fee of $100.00 ? ?What to Expect After you Arrive: ? ?Once you arrive and check in for your appointment, you will be taken to a preparation room within the Radiology Department.  A technologist or Nurse will obtain your medical history, verify that you are correctly prepped for the exam, and explain the procedure.  Afterwards,  an IV will be started in your arm and electrodes will be placed on your skin for EKG monitoring during the stress portion of the exam. Then you will be escorted to the PET/CT scanner.  There, staff will get you positioned on the scanner and obtain a blood pressure and EKG.  During the exam, you will continue to be connected to the EKG and blood pressure machines.  A small, safe amount of a radioactive tracer will be injected in your IV to obtain a series of pictures of your heart along with an injection of a stress agent.   ? ?After your Exam: ? ?It is recommended that you eat a meal and drink a caffeinated beverage to counter act any effects of the stress agent.  Drink plenty of fluids for the remainder of the day and urinate frequently for the first couple of hours after the exam.  Your doctor will inform you of your test results within 7-10 business days. ? ?For questions about your test or how to prepare for your test, please call: ?Marchia Bond, Cardiac Imaging Nurse  Navigator  ?Gordy Clement, Cardiac Imaging Nurse Navigator ?Office: [phone]  ? ? ?

## 2021-08-08 NOTE — Telephone Encounter (Signed)
Pts spouse calling to make provider aware pt mistakenly asked "the wrong provider for a refill on  methotrexate" ? ?Spouse states pts rheumatologist prescribes the medication  ? ? ? ?

## 2021-08-08 NOTE — Telephone Encounter (Signed)
Noted  

## 2021-08-08 NOTE — Telephone Encounter (Signed)
We should not be prescribing her methotrexate.  ?

## 2021-08-08 NOTE — Progress Notes (Signed)
?Cardiology Office Note:   ? ?Date:  08/08/2021  ? ?ID:  Sheila Morales, DOB Sep 27, 1950, MRN [id number] ? ?PCP:  Hoyt Koch, MD  ?Cardiologist:  Elouise Munroe, MD  ?Electrophysiologist:  [name], MD  ? ?Referring MD: Hoyt Koch, *  ? ?Chief Complaint/Reason for Referral: ?Bradycardia, dizziness s/p PPM ? ?History of Present Illness:   ? ?Sheila Morales is a 71 y.o. female with a history of COPD, SLE on methorexate, BPPV, and HTN who presents for follow up. Her husband [name] joins her for the visit today. ? 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 Normal device interrogations.  ? ?Over the last several months she has had a complex history involving 2 hospitalizations.  She was initially hospitalized November 30 through December 11 with fatigue and confusion and profound hyponatremia to 113.  Noted to have E. coli bacteremia with UTI and right-sided pyelonephritis by CT imaging.  She had new onset rapid atrial fibrillation during that hospital stay and acute congestive heart failure (HFpEF), heart failure necessitating BiPAP in the intensive care unit.  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  She returned to the hospital on April 22, 2021 with fever and confusion as well as lethargy, myalgias and fatigue.  She was also noted to have intermittent fevers.  Admitted for acute metabolic encephalopathy, and also had stool positive for occult blood.  Pulmonology recommended long-term prednisone taper and PJP prophylaxis.  Patient continues on methotrexate and hydroxychloroquine for lupus.  Eliquis had been initiated during her prior hospital stay for atrial fibrillation, and at her subsequent hospitalization she was noted to have acute blood loss anemia with a hemoglobin of 7.3.   Felt to have slow gastrointestinal bleed from Eliquis. ? 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  She had a peripheral eosinophilia.  Symptoms respond strongly to prednisone.  Bronchoscopy considered but deferred due to tenuous clinical status in the hospital.  Consideration also for methotrexate lung toxicity as well as lupus related ILD. ? ?She presented to the ED 07/29/2021 after a mechanical fall thought to have occurred because of ongoing nonfocal weakness and pivoting too quickly. She had fallen the previous day as well. She was noted to have two transverse process fractures as well as nondisplaced fracture of her left 11th rib. Medicated with percocet and discharged with prescription for pain medication. ? ?Her husband provides the majority of the history today. Today, she continues to suffer from significant pain due to her rib fracture. She has tried to substitute tylenol instead of percocet, but they note tylenol is not strong enough to manage her pain. I recommended she contact her PCP for assistance with finding a medicinal alternative. She has been off of prednisone for about 3 weeks. Of note, they report she had facial edema while she was on prednisone. This has improved today. UA negative for protein recently, less likely nephritic swelling. ? ?Generally when she is retaining fluid, she has edema in her legs. She states that her legs are constantly a little bit swollen. The edema seems to stay localized around her ankles, and has not progressed proximally. About three weeks ago she was started on a Lasix 40 mg daily and started on potassium. Since  then, her husband has noticed she is more unstable, has more confusion, and is severely fatigued and short of breath with minimal exertion such as walking to this office from the parking lot. It is difficult for her to determine if she is  feeling better or worse with the higher dose of Lasix. Her urine production is only somewhat increased. Today she believes she is okay regarding fluid retention, she did take her morning dose of Lasix so far. Typically she drinks around 50 oz of water a day. Intended to follow a fluid restriction due to SIADH and hyponatremia. ? ?Lately they have noticed some weight gain. This has been gradual in the past 7-8 weeks. They attribute her increased weight due to her recent health issues preventing her from formal exercise. She is limited to sitting due to her pain. Previously she was able to walk at 1.7 mph for 5-10 min on a treadmill, but is not able to do so at this time. ? ?Her husband notes in the past week she has been in Atrial fibrillation. This was the first episode in a while. ? ?Additionally she complains of sudden, intermittent episodes of body tremors that mostly affect her extremities. ? ?Denies palpitations, PND, orthopnea.  Denies syncope or presyncope.  ? ?They plan to take a vacation to [place] in June "come hell or high water". ? ? ?Past Medical History:  ?Diagnosis Date  ? Acute respiratory failure with hypoxia ([place]) 11/05/2017  ? Arthritis   ? Colitis, ischemic ([place])   ? COPD (chronic obstructive pulmonary disease) ([place])   ? External hemorrhoids   ? H/O: rheumatic fever   ? IBS (irritable bowel syndrome)   ? Personal history colonic adenoma 03/25/2008  ? 06/2007 right sided adenoma and 2 right hyperplastic polyps    ? Restless leg syndrome   ? questionable  ? Systemic lupus erythematosus ([place])   ? Tubular adenoma of colon   ? 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 ? ?Past Surgical History:  ?Procedure Laterality Date  ? CHOLECYSTECTOMY    ? CHOLECYSTECTOMY, LAPAROSCOPIC    ? COLONOSCOPY    ? ESOPHAGOGASTRODUODENOSCOPY    ? lesion, vulva excision    ? 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  ? Procedure: PACEMAKER IMPLANT;  Surgeon: [name], MD;  Location: [facility];  Service: Cardiovascular;   Laterality: N/A;  ? TONSILLECTOMY    ? VIDEO BRONCHOSCOPY Bilateral 11/06/2017  ? Procedure: VIDEO BRONCHOSCOPY WITHOUT FLUORO;  Surgeon: Marshell Garfinkel, MD;  Location: [facility];  Service: Cardiopulmonary;  Laterality: Bilateral;  ? ? ?Current Medications: ?Current Meds  ?Medication Sig  ? albuterol (VENTOLIN HFA) 108 (90 Base) MCG/ACT inhaler Inhale 1-2 puffs into the lungs every 6 (six) hours as needed for wheezing or shortness of breath.  ? amiodarone (PACERONE) 200 MG tablet Take 1 tablet (200 mg total) by mouth daily.  ? apixaban (ELIQUIS) 5 MG TABS tablet Take 1 tablet (5 mg total) by mouth 2 (two) times daily.  ? benzonatate (TESSALON) 200 MG capsule Take 1 capsule (200 mg total) by mouth every 6 (six) hours as needed for cough.  ? calcium carbonate (OS-CAL - DOSED IN MG OF ELEMENTAL CALCIUM) 1250 (500 Ca) MG tablet Take 1 tablet by mouth every evening.  ? Cholecalciferol (VITAMIN D) 50 MCG (2000 UT) tablet Take 2,000 Units by mouth every evening.  ? folic acid (FOLVITE) 1 MG tablet Take 1 mg by mouth every evening.  ? gabapentin (NEURONTIN) 300 MG  capsule TAKE TWO CAPSULES BY MOUTH EVERY MORNING, TWO CAPSULES EACH AFTERNOON AND THREE CAPSULES AT BEDTIME  ? hydroxychloroquine (PLAQUENIL) 200 MG tablet Take 200 mg by mouth daily.  ? methotrexate 2.5 MG tablet Take 10 mg by mouth every Thursday.  ? metoprolol tartrate (LOPRESSOR) 25 MG tablet Take 1 tablet (25 mg total) by mouth 2 (two) times daily.  ? Misc Natural Products (ADVANCED JOINT RELIEF) CAPS Take 1 capsule by mouth daily. Joint Advantage Gold 5x  ? Multiple Vitamin (MULTIVITAMIN WITH MINERALS) TABS tablet Take 1 tablet by mouth daily.  ? oxyCODONE-acetaminophen (PERCOCET) 7.5-325 MG tablet Take 1 tablet by mouth every 6 (six) hours as needed for severe pain.  ? RESTASIS 0.05 % ophthalmic emulsion Place 1 drop into both eyes 2 (two) times daily.  ? sodium chloride 1 g tablet Take 1 tablet (1 g total) by mouth 3 (three) times daily with meals.  (Patient taking differently: Take 1 g by mouth 2 (two) times daily with a meal.)  ? umeclidinium-vilanterol (ANORO ELLIPTA) 62.5-25 MCG/ACT AEPB INHALE 1 PUFF BY MOUTH EVERY DAY  ? vitamin C (ASCORBIC ACID) 5

## 2021-08-09 ENCOUNTER — Other Ambulatory Visit: Payer: Self-pay

## 2021-08-09 ENCOUNTER — Telehealth: Payer: Self-pay | Admitting: Internal Medicine

## 2021-08-09 MED ORDER — METOPROLOL TARTRATE 25 MG PO TABS: 25.0000 mg | ORAL_TABLET | Freq: Two times a day (BID) | ORAL | 0 refills | Status: DC

## 2021-08-09 NOTE — Telephone Encounter (Signed)
Called patient to advise refill sent to pharmacy. °

## 2021-08-09 NOTE — Addendum Note (Signed)
Addended by: Rexanne Mano B on: 08/09/2021 09:30 AM ? ? Modules accepted: Orders ? ?

## 2021-08-09 NOTE — Telephone Encounter (Signed)
?*  STAT* If patient is at the pharmacy, call can be transferred to refill team. ? ? ?1. Which medications need to be refilled? (please list name of each medication and dose if known)  ? metoprolol tartrate (LOPRESSOR) 25 MG tablet (Expired)  ? ?2. Which pharmacy/location (including street and city if local pharmacy) is medication to be sent to?  ?[id number] [place], [place] [address]?3. Do they need a 30 day or 90 day supply? 30 day ? ?

## 2021-08-10 ENCOUNTER — Encounter: Payer: Self-pay | Admitting: Internal Medicine

## 2021-08-10 ENCOUNTER — Telehealth: Payer: Self-pay | Admitting: Internal Medicine

## 2021-08-10 NOTE — Telephone Encounter (Signed)
Pt states provider offered to prescribe a pain med for her fractured ribs and she declined ? ?Pt states she now needs the medication but does not know the name ? ?Pt requesting a cb ?

## 2021-08-10 NOTE — Addendum Note (Signed)
Addended by: Cherlynn Kaiser A on: 08/10/2021 04:58 PM ? ? Modules accepted: Orders ? ?

## 2021-08-13 DIAGNOSIS — Z79899 Other long term (current) drug therapy: Secondary | ICD-10-CM | POA: Diagnosis not present

## 2021-08-13 DIAGNOSIS — M329 Systemic lupus erythematosus, unspecified: Secondary | ICD-10-CM | POA: Diagnosis not present

## 2021-08-13 DIAGNOSIS — R0609 Other forms of dyspnea: Secondary | ICD-10-CM | POA: Diagnosis not present

## 2021-08-13 LAB — VITAMIN B1: Vitamin B1 (Thiamine): 35 nmol/L — ABNORMAL HIGH (ref 8–30)

## 2021-08-14 ENCOUNTER — Ambulatory Visit (INDEPENDENT_AMBULATORY_CARE_PROVIDER_SITE_OTHER): Payer: PPO | Admitting: Internal Medicine

## 2021-08-14 ENCOUNTER — Telehealth: Payer: Self-pay

## 2021-08-14 ENCOUNTER — Encounter: Payer: Self-pay | Admitting: Internal Medicine

## 2021-08-14 ENCOUNTER — Ambulatory Visit: Payer: PPO | Admitting: Internal Medicine

## 2021-08-14 ENCOUNTER — Telehealth: Payer: Self-pay | Admitting: Internal Medicine

## 2021-08-14 ENCOUNTER — Other Ambulatory Visit: Payer: PPO

## 2021-08-14 VITALS — BP 124/72 | HR 87 | Ht 62.0 in | Wt 136.6 lb

## 2021-08-14 DIAGNOSIS — I10 Essential (primary) hypertension: Secondary | ICD-10-CM

## 2021-08-14 DIAGNOSIS — Z79899 Other long term (current) drug therapy: Secondary | ICD-10-CM

## 2021-08-14 DIAGNOSIS — R531 Weakness: Secondary | ICD-10-CM

## 2021-08-14 DIAGNOSIS — D6869 Other thrombophilia: Secondary | ICD-10-CM

## 2021-08-14 DIAGNOSIS — R0781 Pleurodynia: Secondary | ICD-10-CM

## 2021-08-14 DIAGNOSIS — I480 Paroxysmal atrial fibrillation: Secondary | ICD-10-CM

## 2021-08-14 DIAGNOSIS — J449 Chronic obstructive pulmonary disease, unspecified: Secondary | ICD-10-CM | POA: Diagnosis not present

## 2021-08-14 DIAGNOSIS — J410 Simple chronic bronchitis: Secondary | ICD-10-CM | POA: Diagnosis not present

## 2021-08-14 DIAGNOSIS — G629 Polyneuropathy, unspecified: Secondary | ICD-10-CM

## 2021-08-14 DIAGNOSIS — R0609 Other forms of dyspnea: Secondary | ICD-10-CM

## 2021-08-14 DIAGNOSIS — I5033 Acute on chronic diastolic (congestive) heart failure: Secondary | ICD-10-CM

## 2021-08-14 DIAGNOSIS — E871 Hypo-osmolality and hyponatremia: Secondary | ICD-10-CM

## 2021-08-14 DIAGNOSIS — E222 Syndrome of inappropriate secretion of antidiuretic hormone: Secondary | ICD-10-CM | POA: Diagnosis not present

## 2021-08-14 DIAGNOSIS — R600 Localized edema: Secondary | ICD-10-CM | POA: Diagnosis not present

## 2021-08-14 DIAGNOSIS — Z950 Presence of cardiac pacemaker: Secondary | ICD-10-CM

## 2021-08-14 LAB — BASIC METABOLIC PANEL
BUN/Creatinine Ratio: 14 (ref 12–28)
BUN: 17 mg/dL (ref 8–27)
CO2: 24 mmol/L (ref 20–29)
Calcium: 8.8 mg/dL (ref 8.7–10.3)
Chloride: 100 mmol/L (ref 96–106)
Creatinine, Ser: 1.19 mg/dL — ABNORMAL HIGH (ref 0.57–1.00)
Glucose: 82 mg/dL (ref 70–99)
Potassium: 4.8 mmol/L (ref 3.5–5.2)
Sodium: 136 mmol/L (ref 134–144)
eGFR: 49 mL/min/{1.73_m2} — ABNORMAL LOW (ref >59)

## 2021-08-14 MED ORDER — TRAMADOL HCL 50 MG PO TABS
50.0000 mg | ORAL_TABLET | Freq: Three times a day (TID) | ORAL | 0 refills | Status: AC | PRN
Start: 2021-08-14 — End: 2021-08-19

## 2021-08-14 NOTE — Telephone Encounter (Signed)
Lab order was released but remains active.    ?

## 2021-08-14 NOTE — Assessment & Plan Note (Signed)
BP at goal on amiodarone 200 mg daily and lasix 20 mg daily and metoprolol 25 mg BID. Recent BMP without indication for change. Volume status is normal volemic today on exam.  ?

## 2021-08-14 NOTE — Telephone Encounter (Signed)
Patient advised of lab order for b12 added. ?Your provider has requested that you have labwork completed today. Please go to [facility] ([id number]) on the second floor of this building before leaving the office today. You do not need to check in. If you are not called within 15 minutes please check with the front desk.   ?

## 2021-08-14 NOTE — Patient Instructions (Signed)
Medication Instructions:  ?No Changes In Medications at this time.  ? ?*If you need a refill on your cardiac medications before your next appointment, please call your pharmacy* ? ?Lab Work: ? BLOOD WORK IN 2 WEEKS ?If you have labs (blood work) drawn today and your tests are completely normal, you will receive your results only by: ?MyChart Message (if you have MyChart) OR ?A paper copy in the mail ?If you have any lab test that is abnormal or we need to change your treatment, we will call you to review the results. ? ?Follow-Up: ?At [facility], you and your health needs are our priority.  As part of our continuing mission to provide you with exceptional heart care, we have created designated Provider Care Teams.  These Care Teams include your primary Cardiologist (physician) and Advanced Practice Providers (APPs -  Physician Assistants and Nurse Practitioners) who all work together to provide you with the care you need, when you need it. ? ?Your next appointment:   ?MAY 11th at 1:00 PM ? ?The format for your next appointment:   ?In Person ? ?Provider:   ?Elouise Munroe, MD   ? ? ? ? ? ? ? ? ?  ?

## 2021-08-14 NOTE — Assessment & Plan Note (Signed)
Still smoking and advised to quit. Not able at this time to make attempt.  ?

## 2021-08-14 NOTE — Assessment & Plan Note (Signed)
Given that she is off prednisone and recent BMP from yesterday is stable with Na 136 I feel we could either reduce to 1 pill daily sodium tablets or stop entirely and recheck BMP about 1 week after stopping. She wishes to see a few other providers in the next few days and let us know her decision so we can arrange labs if needed.  ?

## 2021-08-14 NOTE — Telephone Encounter (Signed)
Calling to say she release patient info by mistake and want to see if she re ordered the lab. Please advise  ?

## 2021-08-14 NOTE — Patient Instructions (Addendum)
We think it is safe to stop the sodium tablets and recheck the labs in 1 week after we stop. ? ?We have sent in pain medicine to use for the ribs tramadol let us know if this does not help. Still safest to take tylenol as you are first. ?

## 2021-08-14 NOTE — Telephone Encounter (Signed)
-----   Message from Pieter Partridge, DO sent at 08/13/2021  3:25 PM EDT ----- ?I would like to check a B12 level for neuropathy ?

## 2021-08-14 NOTE — Progress Notes (Signed)
? ?  Subjective:  ? ?Patient ID: Sheila Morales, female    DOB: 1950/05/12, 71 y.o.   MRN: [id number] ? ?HPI ?The patient is a 71 YO female coming in for follow up. ? ?Review of Systems  ?Constitutional:  Positive for activity change and appetite change.  ?HENT: Negative.    ?Eyes: Negative.   ?Respiratory:  Positive for chest tightness. Negative for cough and shortness of breath.   ?Cardiovascular:  Positive for chest pain. Negative for palpitations and leg swelling.  ?Gastrointestinal:  Negative for abdominal distention, abdominal pain, constipation, diarrhea, nausea and vomiting.  ?Musculoskeletal:  Positive for arthralgias.  ?Skin: Negative.   ?Neurological: Negative.   ?Psychiatric/Behavioral: Negative.    ? ?Objective:  ?Physical Exam ?Constitutional:   ?   Appearance: She is well-developed.  ?HENT:  ?   Head: Normocephalic and atraumatic.  ?Cardiovascular:  ?   Rate and Rhythm: Normal rate and regular rhythm.  ?Pulmonary:  ?   Effort: Pulmonary effort is normal. No respiratory distress.  ?   Breath sounds: Normal breath sounds. No wheezing or rales.  ?   Comments: Left sided chest wall tenderness with bruising ?Chest:  ?   Chest wall: Tenderness present.  ?Abdominal:  ?   General: Bowel sounds are normal. There is no distension.  ?   Palpations: Abdomen is soft.  ?   Tenderness: There is no abdominal tenderness. There is no rebound.  ?Musculoskeletal:  ?   Cervical back: Normal range of motion.  ?Skin: ?   General: Skin is warm and dry.  ?Neurological:  ?   Mental Status: She is alert and oriented to person, place, and time.  ?   Coordination: Coordination abnormal.  ?   Comments: Walker for ambulation  ? ? ?Vitals:  ? 08/14/21 0931  ?BP: 134/80  ?Pulse: 64  ?Resp: 18  ?SpO2: 96%  ?Weight: 131 lb 12.8 oz (59.8 kg)  ?Height: '5\' 2"'$  (1.575 m)  ? ? ?This visit occurred during the SARS-CoV-2 public health emergency.  Safety protocols were in place, including screening questions prior to the visit, additional usage of  staff PPE, and extensive cleaning of exam room while observing appropriate contact time as indicated for disinfecting solutions.  ? ?Assessment & Plan:  ?Visit time 20 minutes in face to face communication with patient and coordination of care, additional 15 minutes spent in record review, coordination or care, ordering tests, communicating/referring to other healthcare professionals, documenting in medical records all on the same day of the visit for total time 35 minutes spent on the visit.  ? ?

## 2021-08-14 NOTE — Telephone Encounter (Signed)
Pt is here in office.  ?

## 2021-08-14 NOTE — Assessment & Plan Note (Signed)
With 2 new rib fractures about 3 weeks ago. Is using tylenol and sometimes this is ineffective. Would like to try tramadol which is prescribed for her today during visit. If ineffective she will let us know. She understands to still using tylenol mostly and use tramadol only if this is not effective. Overall improving and no indication for imaging today. Using walker to help avoid falls.  ?

## 2021-08-15 ENCOUNTER — Encounter: Payer: Self-pay | Admitting: Pulmonary Disease

## 2021-08-15 ENCOUNTER — Ambulatory Visit: Payer: PPO | Admitting: Pulmonary Disease

## 2021-08-15 VITALS — BP 120/66 | HR 73 | Temp 98.6°F | Wt 135.0 lb

## 2021-08-15 DIAGNOSIS — J849 Interstitial pulmonary disease, unspecified: Secondary | ICD-10-CM

## 2021-08-15 LAB — VITAMIN B12: Vitamin B-12: 465 pg/mL (ref 211–911)

## 2021-08-15 NOTE — Progress Notes (Signed)
?Cardiology Office Note:   ? ?Date:  08/14/2021  ? ?ID:  Sheila Morales, DOB 1950-06-21, MRN [id number] ? ?PCP:  Hoyt Koch, MD  ?Cardiologist:  Elouise Munroe, MD  ?Electrophysiologist:  [name], MD  ? ?Referring MD: Hoyt Koch, *  ? ?Chief Complaint/Reason for Referral: ?Bradycardia, dizziness s/p PPM ? ?History of Present Illness:   ? ?Sheila Morales is a 71 y.o. female with a history of COPD, SLE on methorexate, BPPV, and HTN who presents for follow up. Her husband Sheila Morales joins her for the visit today. ? 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 Normal device interrogations.  ? ?08/08/21: ?Over the last several months she has had a complex history involving 2 hospitalizations.  She was initially hospitalized November 30 through December 11 with fatigue and confusion and profound hyponatremia to 113.  Noted to have E. coli bacteremia with UTI and right-sided pyelonephritis by CT imaging.  She had new onset rapid atrial fibrillation during that hospital stay and acute congestive heart failure (HFpEF), heart failure necessitating BiPAP in the intensive care unit.  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  She returned to the hospital on April 22, 2021 with fever and confusion as well as lethargy, myalgias and fatigue.  She was also noted to have intermittent fevers.  Admitted for acute metabolic encephalopathy, and also had stool positive for occult blood.  Pulmonology recommended long-term prednisone taper and PJP prophylaxis.  Patient continues on methotrexate and hydroxychloroquine for lupus.  Eliquis had been initiated during her prior hospital stay for atrial fibrillation, and at her subsequent hospitalization she was noted to have acute blood loss anemia with a hemoglobin of  7.3.  Felt to have slow gastrointestinal bleed from Eliquis. ? 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  She had a peripheral eosinophilia.  Symptoms respond strongly to prednisone.  Bronchoscopy considered but deferred due to tenuous clinical status in the hospital.  Consideration also for methotrexate lung toxicity as well as lupus related ILD. ? ?She presented to the ED 07/29/2021 after a mechanical fall thought to have occurred because of ongoing nonfocal weakness and pivoting too quickly. She had fallen the previous day as well. She was noted to have two transverse process fractures as well as nondisplaced fracture of her left 11th rib. Medicated with percocet and discharged with prescription for pain medication. ? ?Her husband provides the majority of the history today. Today, she continues to suffer from significant pain due to her rib fracture. She has tried to substitute tylenol instead of percocet, but they note tylenol is not strong enough to manage her pain. I recommended she contact her PCP for assistance with finding a medicinal alternative. She has been off of prednisone for about 3 weeks. Of note, they report she had facial edema while she was on prednisone. This has improved today. UA negative for protein recently, less likely nephritic swelling. ? ?Generally when she is retaining fluid, she has edema in her legs. She states that her legs are constantly a little bit swollen. The edema seems to stay localized around her ankles, and has not progressed proximally. About three weeks ago she was started on a Lasix 40 mg daily and started on potassium.  Since then, her husband has noticed she is more unstable, has more confusion, and is severely fatigued and short of breath with minimal exertion such as walking to this office from the parking lot. It is difficult for her to determine if she  is feeling better or worse with the higher dose of Lasix. Her urine production is only somewhat increased. Today she believes she is okay regarding fluid retention, she did take her morning dose of Lasix so far. Typically she drinks around 50 oz of water a day. Intended to follow a fluid restriction due to SIADH and hyponatremia. ? ?Lately they have noticed some weight gain. This has been gradual in the past 7-8 weeks. They attribute her increased weight due to her recent health issues preventing her from formal exercise. She is limited to sitting due to her pain. Previously she was able to walk at 1.7 mph for 5-10 min on a treadmill, but is not able to do so at this time. ? ?Her husband notes in the past week she has been in Atrial fibrillation. This was the first episode in a while. ? ?Additionally she complains of sudden, intermittent episodes of body tremors that mostly affect her extremities. ? ?Denies palpitations, PND, orthopnea.  Denies syncope or presyncope.  ? ?They plan to take a vacation to [place] in June "come hell or high water". ? ?FOLLOW UP 08/14/21:  ?Appears brighter and feeling slightly better particularly since decreasing lasix and rib fractures are healing. LE edema is stable with decreased dose of lasix. We discontinued potassium at last office visit and K is stable on labs today. Sodium improved, Dr. Sharlet Salina told them this AM to discontinue salt tabs. Cr improved since last visit with decrease in lasix. Reviewed future testing again including CMR and PET-CT perfusion upcoming. ? ?Past Medical History:  ?Diagnosis Date  ? Acute respiratory [place] with hypoxia ([place]) 11/05/2017  ? Arthritis   ? Colitis, ischemic ([place])   ? COPD (chronic obstructive pulmonary disease) ([place])   ? External hemorrhoids   ? H/O: rheumatic fever   ? IBS (irritable bowel syndrome)   ? Personal history colonic adenoma 03/25/2008  ? 06/2007 right sided adenoma and 2 right hyperplastic polyps    ? Restless leg syndrome   ?  questionable  ? Systemic lupus erythematosus ([place])   ? Tubular adenoma of colon   ? 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 ? ?Past Surgical History:  ?Procedure Laterality Date  ? CHOLECYSTECTOMY    ? CHOLECYSTECTOMY, LAPAROSCOPIC    ? COLONOSCOPY    ? ESOPHAGOGASTRODUODENOSCOPY    ? lesion, vulva excision    ? 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  ? Procedure: PACEMAKER IMPLANT;  Surgeon: [name], MD;  Location: [facility];  Service: Cardiovascular;  Laterality: N/A;  ? TONSILLECTOMY    ? VIDEO BRONCHOSCOPY Bilateral 11/06/2017  ? Procedure: VIDEO BRONCHOSCOPY WITHOUT FLUORO;  Surgeon: Marshell Garfinkel, MD;  Location: [facility];  Service: Cardiopulmonary;  Laterality: Bilateral;  ? ? ?Current Medications: ?Current Meds  ?Medication Sig  ? albuterol (VENTOLIN HFA) 108 (90 Base) MCG/ACT inhaler Inhale 1-2 puffs into the lungs every 6 (six) hours as needed for wheezing or shortness of breath.  ? amiodarone (PACERONE) 200 MG tablet Take 1 tablet (200 mg total) by mouth daily.  ? apixaban (ELIQUIS) 5 MG TABS tablet Take 1 tablet (5 mg total) by mouth 2 (two) times daily.  ? benzonatate (TESSALON) 200 MG capsule Take 1 capsule (200  mg total) by mouth every 6 (six) hours as needed for cough.  ? calcium carbonate (OS-CAL - DOSED IN MG OF ELEMENTAL CALCIUM) 1250 (500 Ca) MG tablet Take 1 tablet by mouth every evening.  ? Cholecalciferol (VITAMIN D) 50 MCG (2000 UT) tablet Take 2,000 Units by mouth every evening.  ? folic acid (FOLVITE) 1 MG tablet Take 1 mg by mouth every evening.  ? furosemide (LASIX) 20 MG tablet Take 1 tablet (20 mg total) by mouth daily.  ? gabapentin (NEURONTIN) 300 MG capsule TAKE TWO CAPSULES BY MOUTH EVERY MORNING, TWO CAPSULES EACH AFTERNOON AND THREE CAPSULES AT BEDTIME  ? hydroxychloroquine (PLAQUENIL) 200 MG tablet Take 200 mg by mouth daily.  ? methotrexate 2.5 MG tablet Take 10 mg by mouth every Thursday.  ? metoprolol tartrate (LOPRESSOR) 25 MG tablet Take 1 tablet (25 mg total)  by mouth 2 (two) times daily.  ? Misc Natural Products (ADVANCED JOINT RELIEF) CAPS Take 1 capsule by mouth daily. Joint Advantage Gold 5x  ? Multiple Vitamin (MULTIVITAMIN WITH MINERALS) TABS tablet

## 2021-08-15 NOTE — Progress Notes (Signed)
? ?'@Patient'$  ID: Sheila Morales, female    DOB: 01-20-1951, 71 y.o.   MRN: [id number] ? ?Chief Complaint  ?Patient presents with  ? Follow-up  ?  Follow up. Pt states she is doing well since last visit. No issue snoted at this time.   ? ? ?Referring provider: ?Hoyt Koch, * ? ?HPI:  ? ?71 y.o. 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  Most recent cardiology note  reviewed.   ? ?Overall, dyspnea stable to improved.  At last visit some concern for recurrence of eosinophilic pneumonia given symptoms as well as interstitial infiltrates on chest x-ray.  However BNP would have to be markedly elevated.  Increase Lasix with improved symptoms.  She is continue steroid taper without recurrence of symptoms or worsening of symptoms.  She is currently off prednisone.  Stopped Bactrim when she stopped prednisone.  Again, dyspnea seems better overall.  Has been up and down on her Lasix at the discretion of her cardiologist.  She continues to have issues with balance.  Following with neurology.  Most recent neurology note reviewed. ? ?HPI at initial visit: ?Briefly, history of infiltrates in the past with area of posterior right upper lobe scarring.  This predated her amiodarone.  Also on methotrexate.  However, was hospitalized in late 2022 with volume overload and groundglass opacities favored to be most likely related to pulmonary edema.  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  She was placed on prednisone taper at discharge.  At the end of her prednisone, she developed dyspnea.  This worsened over the course of a few days.  Developed fever, came to hospital.  Was found to be hypoxemic.  Coarse interstitial infiltrates on chest x-ray on my review and interpretation.  CT chest showed worsening area of fibrotic changes in the posterior right upper lobe as well as scattered groundglass.  At time of  evaluation he is on 5 L nasal cannula with accessory muscle use.  Felt too ill to safely tolerate bronchoscopy.  Recommend ongoing diuresis but started prednisone half a milligram per kilogram, 40 mg daily.  After 1 dose her fever subsided, her hypoxemia resolved, her dyspnea exertion went away.  She had peripheral eosinophilia 400-500.  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 ? ? ?Questionaires / Pulmonary Flowsheets:  ? ?ACT:  ?   ? View : No data to display.  ?  ?  ?  ? ? ?MMRC: ?   ? View : No data to display.  ?  ?  ?  ? ? ?[place]:  ?   ? View : No data to display.  ?  ?  ?  ? ? ?Tests:  ? ?FENO:  ?No results found for: NITRICOXIDE ? ?PFT: ? ?  Latest Ref Rng & Units 02/06/2017  ?  3:09 PM  ?PFT Results  ?FVC-Pre L 2.14    ?FVC-Predicted Pre % 72    ?FVC-Post L 2.22    ?FVC-Predicted Post % 74    ?Pre FEV1/FVC % % 60    ?Post FEV1/FCV % % 59    ?FEV1-Pre L 1.28    ?FEV1-Predicted Pre % 56    ?FEV1-Post L 1.30    ?DLCO uncorrected ml/min/mmHg 12.45    ?DLCO UNC% %  54    ?DLCO corrected ml/min/mmHg 12.34    ?DLCO COR %Predicted % 53    ?DLVA Predicted % 66    ?TLC L 4.65    ?TLC % Predicted % 94    ?RV % Predicted % 107    ?Personally reviewed and interpreted 2018 PFTs where spirometry is suggestive of moderate obstruction, no bronchodilator response, TLC within normal limits, DLCO moderately reduced ? ?WALK:  ?   ? View : No data to display.  ?  ?  ?  ? ? ?Imaging: ?Personally reviewed and as per EMR discussed in this note ?CT Head Wo Contrast ? ?Result Date: 07/29/2021 ?CLINICAL DATA:  Recent trip and fall yesterday with headaches, initial encounter EXAM: CT HEAD WITHOUT CONTRAST TECHNIQUE: Contiguous axial images were obtained from the base of the skull through the vertex without intravenous contrast. RADIATION DOSE REDUCTION: This exam was  performed according to the departmental dose-optimization program which includes automated exposure control, adjustment of the mA and/or kV according to patient size and/or use of iterative reconstruction technique. COMPARISON:  04/23/2021 FINDINGS: Brain: No evidence of acute infarction, hemorrhage, hydrocephalus, extra-axial collection or mass lesion/mass effect. Mild atrophic changes and chronic white matter ischemic changes are seen. Vascular: No hyperdense vessel or unexpected calcification. Skull: Normal. Negative for fracture or focal lesion. Sinuses/Orbits: No acute finding. Other: Posterior right scalp swelling near the vertex is seen consistent with the recent injury. IMPRESSION: Chronic atrophic and ischemic changes without acute intracranial abnormality. Scalp hematoma posteriorly near the vertex on the right. Electronically Signed   By: Inez Catalina M.D.   On: 07/29/2021 19:39  ? ?CT Renal Stone Study ? ?Result Date: 07/29/2021 ?CLINICAL DATA:  Recent falls with left-sided flank pain, initial encounter EXAM: CT ABDOMEN AND PELVIS WITHOUT CONTRAST TECHNIQUE: Multidetector CT imaging of the abdomen and pelvis was performed following the standard protocol without IV contrast. RADIATION DOSE REDUCTION: This exam was performed according to the departmental dose-optimization program which includes automated exposure control, adjustment of the mA and/or kV according to patient size and/or use of iterative reconstruction technique. COMPARISON:  04/24/2021 FINDINGS: Lower chest: Chronic fibrotic changes are noted in the bases bilaterally. Hepatobiliary: No focal liver abnormality is seen. Status post cholecystectomy. No biliary dilatation. Pancreas: Unremarkable. No pancreatic ductal dilatation or surrounding inflammatory changes. Spleen: Normal in size without focal abnormality. Adrenals/Urinary Tract: Adrenal glands are within normal limits. Kidneys demonstrate no definitive calculi or obstructive changes.  Persistent right perirenal fluid is noted similar to that seen on the prior exam. No obstructive changes are seen. The bladder is well distended. Stomach/Bowel: Scattered diverticular change of the colon is noted without evidence of diverticulitis. The appendix is within normal limits. Small bowel and stomach are unremarkable. Some ingested contrast is noted within the distal esophagus. Vascular/Lymphatic: Aortic atherosclerosis. No enlarged abdominal or pelvic lymph nodes. Reproductive: Uterus and bilateral adnexa are unremarkable. Other: No abdominal wall hernia or abnormality. No abdominopelvic ascites. Musculoskeletal: Degenerative changes of lumbar spine are noted. Old healed rib fractures on the left are seen. Undisplaced fractures involving the tenth and eleventh left transverse processes are seen. Minimally displaced fracture of the central portion of the eleventh rib is noted as well. Some associated adjacent edema is noted without focal hematoma. IMPRESSION: Fractures of the tenth and eleventh left transverse processes with an undisplaced fracture of the eleventh rib centrally. Some adjacent edema is noted. These changes are consistent with the recent injury. Diverticulosis without diverticulitis. Overall stable appearance of right pararenal fluid  without obstructive change. Electronically Signed   By: Inez Catalina M.D.   On: 07/29/2021 22:14   ? ?Lab Results: ?Personally reviewed ?CBC ?   ?Component Value Date/Time  ? WBC 7.0 07/29/2021 1919  ? RBC 3.70 (L) 07/29/2021 1919  ? HGB 11.8 (L) 07/29/2021 1919  ? HGB 11.9 05/22/2021 0842  ? HGB 13.1 01/13/2009 1108  ? HCT 35.5 (L) 07/29/2021 1919  ? HCT 35.3 05/22/2021 0842  ? HCT 37.5 01/13/2009 1108  ? PLT 208 07/29/2021 1919  ? PLT 194 05/22/2021 0842  ? MCV 95.9 07/29/2021 1919  ? MCV 92 05/22/2021 0842  ? MCV 86.2 01/13/2009 1108  ? MCH 31.9 07/29/2021 1919  ? MCHC 33.2 07/29/2021 1919  ? RDW 15.4 07/29/2021 1919  ? RDW 14.5 05/22/2021 0842  ? RDW 12.2  01/13/2009 1108  ? LYMPHSABS 1.0 07/29/2021 1919  ? LYMPHSABS 1.2 05/11/2020 1203  ? LYMPHSABS 2.2 01/13/2009 1108  ? MONOABS 0.6 07/29/2021 1919  ? MONOABS 0.7 01/13/2009 1108  ? EOSABS 0.1 07/29/2021 1919  ? EOSABS

## 2021-08-15 NOTE — Patient Instructions (Signed)
Nice to see you ? ?I am glad the breathing is stable off the prednisone ? ?No medication changes  ? ?Follow up in 6 months or sooner as needed ?

## 2021-08-16 DIAGNOSIS — L932 Other local lupus erythematosus: Secondary | ICD-10-CM | POA: Diagnosis not present

## 2021-08-16 DIAGNOSIS — Z79899 Other long term (current) drug therapy: Secondary | ICD-10-CM | POA: Diagnosis not present

## 2021-08-16 DIAGNOSIS — Z6824 Body mass index (BMI) 24.0-24.9, adult: Secondary | ICD-10-CM | POA: Diagnosis not present

## 2021-08-16 DIAGNOSIS — M329 Systemic lupus erythematosus, unspecified: Secondary | ICD-10-CM | POA: Diagnosis not present

## 2021-08-16 DIAGNOSIS — I7300 Raynaud's syndrome without gangrene: Secondary | ICD-10-CM | POA: Diagnosis not present

## 2021-08-16 LAB — MULTIPLE MYELOMA PANEL, SERUM
Albumin SerPl Elph-Mcnc: 3.2 g/dL (ref 2.9–4.4)
Albumin/Glob SerPl: 1.1 (ref 0.7–1.7)
Alpha 1: 0.3 g/dL (ref 0.0–0.4)
Alpha2 Glob SerPl Elph-Mcnc: 0.8 g/dL (ref 0.4–1.0)
B-Globulin SerPl Elph-Mcnc: 0.9 g/dL (ref 0.7–1.3)
Gamma Glob SerPl Elph-Mcnc: 0.9 g/dL (ref 0.4–1.8)
Globulin, Total: 3 g/dL (ref 2.2–3.9)
IgA/Immunoglobulin A, Serum: 380 mg/dL (ref 64–422)
IgG (Immunoglobin G), Serum: 965 mg/dL (ref 586–1602)
IgM (Immunoglobulin M), Srm: 63 mg/dL (ref 26–217)
Total Protein: 6.2 g/dL (ref 6.0–8.5)

## 2021-08-20 ENCOUNTER — Other Ambulatory Visit: Payer: Self-pay | Admitting: Internal Medicine

## 2021-08-20 ENCOUNTER — Telehealth: Payer: Self-pay | Admitting: Internal Medicine

## 2021-08-20 DIAGNOSIS — E871 Hypo-osmolality and hyponatremia: Secondary | ICD-10-CM

## 2021-08-20 NOTE — Telephone Encounter (Signed)
Labs in

## 2021-08-20 NOTE — Telephone Encounter (Signed)
Spoke with the patient and she has stopped the sodium pill as discussed in her last office visit. She has been scheduled to have blood done 08/21/2021 at 8:45 am. She is aware of appt date and time.  ?

## 2021-08-20 NOTE — Telephone Encounter (Signed)
Pt states provider ordered future lab and she has an appt on 08-21-2021 ? ?Informed pt I do not see an order for labs/ future appt ? ?Pt requesting a cb ?

## 2021-08-21 ENCOUNTER — Telehealth: Payer: Self-pay | Admitting: Internal Medicine

## 2021-08-21 ENCOUNTER — Other Ambulatory Visit (INDEPENDENT_AMBULATORY_CARE_PROVIDER_SITE_OTHER): Payer: PPO

## 2021-08-21 DIAGNOSIS — E871 Hypo-osmolality and hyponatremia: Secondary | ICD-10-CM | POA: Diagnosis not present

## 2021-08-21 LAB — BASIC METABOLIC PANEL
BUN: 16 mg/dL (ref 6–23)
CO2: 28 mEq/L (ref 19–32)
Calcium: 9.2 mg/dL (ref 8.4–10.5)
Chloride: 95 mEq/L — ABNORMAL LOW (ref 96–112)
Creatinine, Ser: 1.25 mg/dL — ABNORMAL HIGH (ref 0.40–1.20)
GFR: 43.28 mL/min — ABNORMAL LOW (ref >60.00)
Glucose, Bld: 99 mg/dL (ref 70–99)
Potassium: 4.2 mEq/L (ref 3.5–5.1)
Sodium: 130 mEq/L — ABNORMAL LOW (ref 135–145)

## 2021-08-21 NOTE — Telephone Encounter (Signed)
Patient informed of Dr. Delphina Cahill response. Patient stated she will follow up with her PCP regarding salt tablets. ? ?"She has recently stopped her salt tabs per Dr. Sharlet Salina. My assumption would be that she could resume salt tabs, however I will leave this to the discretion of Dr. Sharlet Salina since she timed these labs.  ?Thanks,  ?GA"  ?

## 2021-08-21 NOTE — Telephone Encounter (Signed)
Patient calling to make sure her lab orders are released so she can go to [facility] for blood work.   ?

## 2021-08-21 NOTE — Telephone Encounter (Signed)
Patient had BMET this morning ordered by Dr. Sharlet Salina. Patient wants to know what to do about her sodium. Please advise. ?

## 2021-08-27 ENCOUNTER — Telehealth (HOSPITAL_COMMUNITY): Payer: Self-pay | Admitting: Emergency Medicine

## 2021-08-27 ENCOUNTER — Ambulatory Visit (INDEPENDENT_AMBULATORY_CARE_PROVIDER_SITE_OTHER): Payer: PPO

## 2021-08-27 DIAGNOSIS — I495 Sick sinus syndrome: Secondary | ICD-10-CM | POA: Diagnosis not present

## 2021-08-27 LAB — CUP PACEART REMOTE DEVICE CHECK
Date Time Interrogation Session: 20230508083631
Implantable Lead Implant Date: 20220204
Implantable Lead Implant Date: 20220204
Implantable Lead Location: 753859
Implantable Lead Location: 753860
Implantable Lead Model: 377169
Implantable Lead Model: 377171
Implantable Lead Serial Number: 8000143637
Implantable Lead Serial Number: 8000151447
Implantable Pulse Generator Implant Date: 20220204
Pulse Gen Model: 407145
Pulse Gen Serial Number: 70020732

## 2021-08-27 NOTE — Telephone Encounter (Signed)
Reaching out to patient to offer assistance regarding upcoming cardiac imaging study; pt verbalizes understanding of appt date/time, parking situation and where to check in, pre-test NPO status and medications ordered, and verified current allergies; name and call back number provided for further questions should they arise ?Sheila Bond RN Navigator Cardiac Imaging ?[facility] ?[phone] office ?[phone] cell ? ?Denies iv issues ?Holding bb ?Arrival 830 ?

## 2021-08-28 ENCOUNTER — Encounter (HOSPITAL_COMMUNITY)
Admission: RE | Admit: 2021-08-28 | Discharge: 2021-08-28 | Disposition: A | Discharge status: Home / Self Care | Payer: PPO | Source: Ambulatory Visit | Attending: Internal Medicine | Admitting: Internal Medicine

## 2021-08-28 DIAGNOSIS — R0609 Other forms of dyspnea: Secondary | ICD-10-CM | POA: Insufficient documentation

## 2021-08-28 DIAGNOSIS — I700 Atherosclerosis of aorta: Secondary | ICD-10-CM | POA: Diagnosis not present

## 2021-08-28 DIAGNOSIS — I2510 Atherosclerotic heart disease of native coronary artery without angina pectoris: Secondary | ICD-10-CM | POA: Diagnosis not present

## 2021-08-28 MED ORDER — RUBIDIUM RB82 GENERATOR (RUBYFILL)
25.0000 | PACK | Freq: Once | INTRAVENOUS | Status: AC
  Administered 2021-08-28: 25 via INTRAVENOUS

## 2021-08-28 MED ORDER — REGADENOSON 0.4 MG/5ML IV SOLN
INTRAVENOUS | Status: AC
  Filled 2021-08-28: qty 5

## 2021-08-29 ENCOUNTER — Other Ambulatory Visit: Payer: Self-pay | Admitting: Cardiovascular Disease

## 2021-08-29 LAB — NM PET CT CARDIAC PERFUSION MULTI W/ABSOLUTE BLOODFLOW
LV dias vol: 56 mL (ref 46–106)
LV sys vol: 15 mL
MBFR: 2.04
Nuc Rest EF: 63 %
Nuc Stress EF: 73 %
Peak HR: 80 {beats}/min
Rest HR: 81 {beats}/min
Rest MBF: 1.14 ml/g/min
Rest Nuclear Isotope Dose: 15.9 mCi
Rest perfusion cavity size (mL): 50 mL
ST Depression (mm): 0 mm
Stress MBF: 2.33 ml/g/min
Stress Nuclear Isotope Dose: 15.9 mCi
Stress perfusion cavity size (mL): 56 mL
TID: 1.04

## 2021-08-29 MED ORDER — AMIODARONE HCL 200 MG PO TABS: 200.0000 mg | ORAL_TABLET | Freq: Every day | ORAL | 3 refills | Status: DC

## 2021-08-30 ENCOUNTER — Encounter: Payer: Self-pay | Admitting: Pulmonary Disease

## 2021-08-30 ENCOUNTER — Ambulatory Visit: Payer: PPO | Admitting: Internal Medicine

## 2021-08-30 ENCOUNTER — Telehealth: Payer: PPO

## 2021-09-06 ENCOUNTER — Ambulatory Visit: Payer: PPO | Admitting: Internal Medicine

## 2021-09-07 ENCOUNTER — Other Ambulatory Visit: Payer: Self-pay | Admitting: Internal Medicine

## 2021-09-11 NOTE — Progress Notes (Signed)
Remote pacemaker transmission.   

## 2021-09-12 DIAGNOSIS — H903 Sensorineural hearing loss, bilateral: Secondary | ICD-10-CM | POA: Diagnosis not present

## 2021-09-13 ENCOUNTER — Other Ambulatory Visit (HOSPITAL_COMMUNITY): Payer: PPO

## 2021-09-14 ENCOUNTER — Other Ambulatory Visit: Payer: Self-pay | Admitting: Cardiovascular Disease

## 2021-09-14 ENCOUNTER — Encounter: Payer: Self-pay | Admitting: Internal Medicine

## 2021-09-14 ENCOUNTER — Ambulatory Visit: Payer: PPO | Admitting: Internal Medicine

## 2021-09-14 VITALS — BP 124/68 | HR 68 | Ht 62.0 in | Wt 131.4 lb

## 2021-09-14 DIAGNOSIS — I10 Essential (primary) hypertension: Secondary | ICD-10-CM

## 2021-09-14 DIAGNOSIS — Z950 Presence of cardiac pacemaker: Secondary | ICD-10-CM | POA: Diagnosis not present

## 2021-09-14 DIAGNOSIS — I495 Sick sinus syndrome: Secondary | ICD-10-CM

## 2021-09-14 NOTE — Patient Instructions (Signed)
Medication Instructions:  Your physician recommends that you continue on your current medications as directed. Please refer to the Current Medication list given to you today.  Labwork: None ordered.  Testing/Procedures: None ordered.  Follow-Up: Your physician wants you to follow-up in: one year with [name], MD or one of the following Advanced Practice Providers on your designated Care Team:   Tommye Standard, [place] Legrand Como "Jonni Sanger" Chalmers Cater, [place]  Remote monitoring is used to monitor your Pacemaker from home. This monitoring reduces the number of office visits required to check your device to one time per year. It allows [place] to keep an eye on the functioning of your device to ensure it is working properly. You are scheduled for a device check from home on 11/26/2021. You may send your transmission at any time that day. If you have a wireless device, the transmission will be sent automatically. After your physician reviews your transmission, you will receive a postcard with your next transmission date.  Any Other Special Instructions Will Be Listed Below (If Applicable).  If you need a refill on your cardiac medications before your next appointment, please call your pharmacy.   Important Information About Sugar

## 2021-09-14 NOTE — Progress Notes (Signed)
HPI [name] returns today for followup. She is a pleasant 71 yo woman with a h/o sinus node dysfunction, s/p PPM insertion. She has returned to work. She denies chest pain or sob. She has some swelling at night but resolved by the morning. 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  No Known Allergies   Current Outpatient Medications  Medication Sig Dispense Refill   albuterol (VENTOLIN HFA) 108 (90 Base) MCG/ACT inhaler Inhale 1-2 puffs into the lungs every 6 (six) hours as needed for wheezing or shortness of breath. 8 g 5   amiodarone (PACERONE) 200 MG tablet Take 1 tablet (200 mg total) by mouth daily. 90 tablet 3   apixaban (ELIQUIS) 5 MG TABS tablet Take 1 tablet (5 mg total) by mouth 2 (two) times daily. 180 tablet 3   benzonatate (TESSALON) 200 MG capsule Take 1 capsule (200 mg total) by mouth every 6 (six) hours as needed for cough. 30 capsule 1   calcium carbonate (OS-CAL - DOSED IN MG OF ELEMENTAL CALCIUM) 1250 (500 Ca) MG tablet Take 1 tablet by mouth every evening.     Cholecalciferol (VITAMIN D) 50 MCG (2000 UT) tablet Take 2,000 Units by mouth every evening.     folic acid (FOLVITE) 1 MG tablet Take 1 mg by mouth every evening.     furosemide (LASIX) 20 MG tablet Take 1 tablet (20 mg total) by mouth daily. 90 tablet 0   gabapentin (NEURONTIN) 300 MG capsule TAKE TWO CAPSULES BY MOUTH EVERY MORNING, TWO CAPSULES EACH AFTERNOON AND THREE CAPSULES AT BEDTIME 630 capsule 0   hydroxychloroquine (PLAQUENIL) 200 MG tablet Take 200 mg by mouth daily.     methotrexate 2.5 MG tablet Take 10 mg by mouth every Thursday.     metoprolol tartrate (LOPRESSOR) 25 MG tablet Take 1 tablet (25 mg total) by mouth 2 (two) times daily. 180 tablet 0   Misc Natural Products (ADVANCED JOINT RELIEF) CAPS Take 1 capsule by mouth daily. Joint Advantage Gold 5x     Multiple Vitamin (MULTIVITAMIN WITH MINERALS) TABS tablet Take 1 tablet by mouth  daily.     RESTASIS 0.05 % ophthalmic emulsion Place 1 drop into both eyes 2 (two) times daily.  6   sodium chloride 1 g tablet Take 1 tablet (1 g total) by mouth 3 (three) times daily with meals. (Patient taking differently: Take 1 g by mouth 2 (two) times daily with a meal.) 60 tablet 0   umeclidinium-vilanterol (ANORO ELLIPTA) 62.5-25 MCG/ACT AEPB INHALE 1 PUFF BY MOUTH EVERY DAY 60 each 5   vitamin C (ASCORBIC ACID) 500 MG tablet Take 500 mg by mouth daily.     vitamin E 180 MG (400 UNITS) capsule Take 400 Units by mouth daily.     zolpidem (AMBIEN) 5 MG tablet TAKE 1 TABLET BY MOUTH AT BEDTIME AS NEEDED FOR SLEEP. 30 tablet 5   No current facility-administered medications for this visit.     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   Past Surgical History:  Procedure Laterality Date   CHOLECYSTECTOMY     CHOLECYSTECTOMY, LAPAROSCOPIC     COLONOSCOPY     ESOPHAGOGASTRODUODENOSCOPY     lesion, vulva excision     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   Procedure: PACEMAKER IMPLANT;  Surgeon: [name], MD;  Location: [facility];  Service: Cardiovascular;  Laterality: N/A;   TONSILLECTOMY     VIDEO BRONCHOSCOPY Bilateral 11/06/2017   Procedure: VIDEO BRONCHOSCOPY WITHOUT FLUORO;  Surgeon: Marshell Garfinkel, MD;  Location: [facility];  Service: Cardiopulmonary;  Laterality: Bilateral;     Family History  Problem  Relation Age of Onset   Stroke Mother        Deceased, 35s   Atrial fibrillation Father    Colon cancer Father    Dementia Sister    Other Sister        Pick's disease, deceased   Healthy Daughter      Social History   Socioeconomic History   Marital status: Married    Spouse name: Not on file   Number of children: 2   Years of education: 12   Highest education level: Not on file  Occupational History   Occupation: Education administrator business   Occupation: caregiver  Tobacco Use   Smoking status: Former    Packs/day: 0.25    Years: 30.00    Pack years: 7.50    Types: Cigarettes    Quit date: 01/21/2020    Years since quitting: 1.6   Smokeless tobacco: Never   Tobacco comments:    smokes "every now and then" 08/24/20 [facility]   Vaping Use   Vaping Use: Never used  Substance and Sexual Activity   Alcohol use: Yes    Alcohol/week: 5.0 standard drinks    Types: 5 Standard drinks or equivalent per week    Comment: 4 out of 7 dyas per husband at bedside   Drug use: No   Sexual activity: Yes  Other Topics Concern   Not on file  Social History Narrative   HSG. Married '71. 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       One level home with spouse   Right handed   Caffeine - coffee 2-3 cups am   Exercise - treadmill    Education - 12th grade   Social Determinants of Health   Financial Resource Strain: Not on file  Food Insecurity: Not on file  Transportation Needs: Not on file  Physical Activity: Not on file  Stress: Not on file  Social Connections: Not on file  Intimate Partner Violence: Not on file     BP 124/68   Pulse 68   Ht '5\' 2"'$  (1.575 m)   Wt 131 lb 6.4 oz (59.6 kg)   SpO2 93%   BMI 24.03 kg/m   Physical Exam:  Well appearing NAD HEENT: Unremarkable Neck:  No JVD, no thyromegally Lymphatics:  No adenopathy Back:  No CVA tenderness Lungs:  Clear with no wheezes; scattered  rales HEART:  Regular rate rhythm, no murmurs, no rubs, no clicks Abd:  soft, positive bowel sounds, no organomegally, no rebound, no guarding Ext:  2 plus pulses, no  edema, no cyanosis, no clubbing Skin:  No rashes no nodules Neuro:  CN II through XII intact, motor grossly intact  EKG - nsr with atrial pacing  DEVICE  Normal device function.  See PaceArt for details.   Assess/Plan:  Sinus node dysfunction - she is asymptomatic s/p PPM insertion. 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 We will recheck in several months; HTN - her bp is well controlled. Interstitial lung disease - she is not oxygen dependent. She will continue her bronchodilators.   Carleene Overlie [place],MD

## 2021-09-18 ENCOUNTER — Encounter: Payer: Self-pay | Admitting: Family Medicine

## 2021-09-18 ENCOUNTER — Ambulatory Visit (INDEPENDENT_AMBULATORY_CARE_PROVIDER_SITE_OTHER): Payer: PPO | Admitting: Family Medicine

## 2021-09-18 VITALS — BP 146/84 | HR 69 | Temp 97.6°F | Ht 62.0 in | Wt 128.0 lb

## 2021-09-18 DIAGNOSIS — L237 Allergic contact dermatitis due to plants, except food: Secondary | ICD-10-CM | POA: Diagnosis not present

## 2021-09-18 DIAGNOSIS — L282 Other prurigo: Secondary | ICD-10-CM

## 2021-09-18 MED ORDER — TRIAMCINOLONE ACETONIDE 0.1 % EX CREA: 1.0000 "application " | TOPICAL_CREAM | Freq: Two times a day (BID) | CUTANEOUS | 0 refills | Status: DC

## 2021-09-18 NOTE — Patient Instructions (Signed)
Use the steroid cream for the next week. Use cool compresses, showers or baths to help control itching.   Follow up if the rash gets much worse.

## 2021-09-18 NOTE — Progress Notes (Signed)
New Patient Office Visit  Subjective    Patient ID: Sheila Morales, female    DOB: Feb 12, 1951  Age: 71 y.o. MRN: [id number]  CC:  Chief Complaint  Patient presents with   Rash    Believes she has come into contact with poison oak, rash on both hands for about a week radiating up the arm, was pulling weeds out of flower bed.    HPI Sheila Morales presents to establish care.   She is here for a 3 day hx of a pruritic rash with vesicles after working in her garden. States her husband told her she was pulling weeds that looked like poison ivy.  No fever, chills, chest pain, palpitations, shortness of breath, N/V/D.   Outpatient Encounter Medications as of 09/18/2021  Medication Sig   albuterol (VENTOLIN HFA) 108 (90 Base) MCG/ACT inhaler Inhale 1-2 puffs into the lungs every 6 (six) hours as needed for wheezing or shortness of breath.   amiodarone (PACERONE) 200 MG tablet Take 1 tablet (200 mg total) by mouth daily.   apixaban (ELIQUIS) 5 MG TABS tablet Take 1 tablet (5 mg total) by mouth 2 (two) times daily.   benzonatate (TESSALON) 200 MG capsule Take 1 capsule (200 mg total) by mouth every 6 (six) hours as needed for cough.   calcium carbonate (OS-CAL - DOSED IN MG OF ELEMENTAL CALCIUM) 1250 (500 Ca) MG tablet Take 1 tablet by mouth every evening.   Cholecalciferol (VITAMIN D) 50 MCG (2000 UT) tablet Take 2,000 Units by mouth every evening.   folic acid (FOLVITE) 1 MG tablet Take 1 mg by mouth every evening.   furosemide (LASIX) 20 MG tablet TAKE 1 TABLET (20 MG) BY MOUTH DAILY, TAKE 1 EXTRA TABLET BY MOUTH (20 MG) AS NEEDED FOR INCREASED SWELLING.   gabapentin (NEURONTIN) 300 MG capsule TAKE TWO CAPSULES BY MOUTH EVERY MORNING, TWO CAPSULES EACH AFTERNOON AND THREE CAPSULES AT BEDTIME   hydroxychloroquine (PLAQUENIL) 200 MG tablet Take 200 mg by mouth daily.   methotrexate 2.5 MG tablet Take 10 mg by mouth every Thursday.   metoprolol tartrate (LOPRESSOR) 25 MG tablet Take 1 tablet (25  mg total) by mouth 2 (two) times daily.   Misc Natural Products (ADVANCED JOINT RELIEF) CAPS Take 1 capsule by mouth daily. Joint Advantage Gold 5x   Multiple Vitamin (MULTIVITAMIN WITH MINERALS) TABS tablet Take 1 tablet by mouth daily.   RESTASIS 0.05 % ophthalmic emulsion Place 1 drop into both eyes 2 (two) times daily.   sodium chloride 1 g tablet Take 1 tablet (1 g total) by mouth 3 (three) times daily with meals. (Patient taking differently: Take 1 g by mouth 2 (two) times daily with a meal.)   triamcinolone cream (KENALOG) 0.1 % Apply 1 application. topically 2 (two) times daily.   umeclidinium-vilanterol (ANORO ELLIPTA) 62.5-25 MCG/ACT AEPB INHALE 1 PUFF BY MOUTH EVERY DAY   vitamin C (ASCORBIC ACID) 500 MG tablet Take 500 mg by mouth daily.   vitamin E 180 MG (400 UNITS) capsule Take 400 Units by mouth daily.   zolpidem (AMBIEN) 5 MG tablet TAKE 1 TABLET BY MOUTH AT BEDTIME AS NEEDED FOR SLEEP.   No facility-administered encounter medications on file as of 09/18/2021.    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     Past Surgical History:  Procedure Laterality Date   CHOLECYSTECTOMY     CHOLECYSTECTOMY, LAPAROSCOPIC     COLONOSCOPY     ESOPHAGOGASTRODUODENOSCOPY     lesion, vulva excision     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   Procedure: PACEMAKER IMPLANT;  Surgeon: [name], MD;  Location: [facility];  Service: Cardiovascular;  Laterality: N/A;   TONSILLECTOMY     VIDEO  BRONCHOSCOPY Bilateral 11/06/2017   Procedure: VIDEO BRONCHOSCOPY WITHOUT FLUORO;  Surgeon: Marshell Garfinkel, MD;  Location: [facility];  Service: Cardiopulmonary;  Laterality: Bilateral;    Family History  Problem Relation Age of Onset   Stroke Mother        Deceased, 47s   Atrial fibrillation Father    Colon cancer Father    Dementia Sister    Other Sister        Pick's disease, deceased   Healthy Daughter     Social History   Socioeconomic History   Marital status: Married    Spouse name: Not on file   Number of children: 2   Years of education: 12   Highest education level: Not on file  Occupational History   Occupation: Education administrator business   Occupation: caregiver  Tobacco Use   Smoking status: Former    Packs/day: 0.25    Years: 30.00    Pack years: 7.50    Types: Cigarettes    Quit date: 01/21/2020    Years since quitting: 1.6   Smokeless tobacco: Never   Tobacco comments:    smokes "every now and then" 08/24/20 [facility]   Vaping Use   Vaping Use: Never used  Substance and Sexual Activity   Alcohol use: Yes    Alcohol/week: 5.0 standard drinks    Types: 5 Standard drinks or equivalent per week    Comment: 4 out of 7 dyas per husband at bedside   Drug use: No   Sexual activity: Yes  Other Topics Concern   Not on file  Social History Narrative   HSG. Married '71. 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       One level home with spouse   Right handed   Caffeine - coffee 2-3 cups am   Exercise - treadmill    Education - 12th grade   Social Determinants of Health   Financial Resource Strain: Not on file  Food Insecurity: Not on file  Transportation Needs: Not on file  Physical Activity: Not on file  Stress: Not on file  Social Connections: Not on file  Intimate Partner Violence: Not on file    ROS      Objective    BP (!) 146/84 (BP Location: Right Arm, Patient  Position: Sitting, Cuff Size: Normal)   Pulse 69   Temp 97.6 F (36.4 C) (Temporal)   Ht '5\' 2"'$  (1.575 m)   Wt 128 lb (58.1 kg)   SpO2 96%   BMI 23.41 kg/m  Alert and oriented and in no acute distress. Respirations unlabored. Vesicles on dorsal aspect of  left hand and between thumb and index fingers. Linear erythematous rash of her left lateral neck, left forearm and right hand. No sign of infection.    Physical Exam      Assessment & Plan:   Problem List Items Addressed This Visit   None Visit Diagnoses     Poison ivy dermatitis    -  Primary   Relevant Medications   triamcinolone cream (KENALOG) 0.1 %   Pruritic rash           No follow-ups on file.   Harland Dingwall, NP-C

## 2021-09-21 ENCOUNTER — Ambulatory Visit (INDEPENDENT_AMBULATORY_CARE_PROVIDER_SITE_OTHER): Payer: PPO

## 2021-09-21 ENCOUNTER — Encounter: Payer: Self-pay | Admitting: Family Medicine

## 2021-09-21 DIAGNOSIS — Z0000 Encounter for general adult medical examination without abnormal findings: Secondary | ICD-10-CM | POA: Diagnosis not present

## 2021-09-21 NOTE — Progress Notes (Cosign Needed Addendum)
Subjective:   Sheila Morales is a 71 y.o. female who presents for Medicare Annual (Subsequent) preventive examination.   I connected with Mirian Mo today by telephone and verified that I am speaking with the correct person using two identifiers. Location patient: home Location provider: work Persons participating in the virtual visit: patient, provider.   I discussed the limitations, risks, security and privacy concerns of performing an evaluation and management service by telephone and the availability of in person appointments. I also discussed with the patient that there may be a patient responsible charge related to this service. The patient expressed understanding and verbally consented to this telephonic visit.    Interactive audio and video telecommunications were attempted between this provider and patient, however failed, due to patient having technical difficulties OR patient did not have access to video capability.  We continued and completed visit with audio only.    Review of Systems     Cardiac Risk Factors include: advanced age (>75mn, >>77women)     Objective:    Today's Vitals   There is no height or weight on file to calculate BMI.     09/21/2021    1:36 PM 08/07/2021    1:33 PM 07/29/2021    6:39 PM 04/24/2021   12:00 AM 04/23/2021   10:13 AM 04/14/2021    5:24 PM 03/21/2021    7:06 PM  Advanced Directives  Does Patient Have a Medical Advance Directive? Yes Yes No Yes Yes Yes Yes  Type of AParamedicof [place]an MateoLiving will   Healthcare Power of [place]ang[place]Living will H[place]Living will HSan Diego Country EstatesLiving will  Does patient want to make changes to medical advance directive?    No - Patient declined   No - Patient declined  Copy of [place] Chart? No - copy requested   No - copy requested   No - copy requested  Would patient like information on creating a  medical advance directive?   No - Patient declined    No - Patient declined    Current Medications (verified) Outpatient Encounter Medications as of 09/21/2021  Medication Sig   albuterol (VENTOLIN HFA) 108 (90 Base) MCG/ACT inhaler Inhale 1-2 puffs into the lungs every 6 (six) hours as needed for wheezing or shortness of breath.   amiodarone (PACERONE) 200 MG tablet Take 1 tablet (200 mg total) by mouth daily.   apixaban (ELIQUIS) 5 MG TABS tablet Take 1 tablet (5 mg total) by mouth 2 (two) times daily.   benzonatate (TESSALON) 200 MG capsule Take 1 capsule (200 mg total) by mouth every 6 (six) hours as needed for cough.   calcium carbonate (OS-CAL - DOSED IN MG OF ELEMENTAL CALCIUM) 1250 (500 Ca) MG tablet Take 1 tablet by mouth every evening.   Cholecalciferol (VITAMIN D) 50 MCG (2000 UT) tablet Take 2,000 Units by mouth every evening.   folic acid (FOLVITE) 1 MG tablet Take 1 mg by mouth every evening.   furosemide (LASIX) 20 MG tablet TAKE 1 TABLET (20 MG) BY MOUTH DAILY, TAKE 1 EXTRA TABLET BY MOUTH (20 MG) AS NEEDED FOR INCREASED SWELLING.   gabapentin (NEURONTIN) 300 MG capsule TAKE TWO CAPSULES BY MOUTH EVERY MORNING, TWO CAPSULES EACH AFTERNOON AND THREE CAPSULES AT BEDTIME   hydroxychloroquine (PLAQUENIL) 200 MG tablet Take 200 mg by mouth daily.   methotrexate 2.5 MG tablet Take 10 mg by mouth every Thursday.  metoprolol tartrate (LOPRESSOR) 25 MG tablet Take 1 tablet (25 mg total) by mouth 2 (two) times daily.   Misc Natural Products (ADVANCED JOINT RELIEF) CAPS Take 1 capsule by mouth daily. Joint Advantage Gold 5x   Multiple Vitamin (MULTIVITAMIN WITH MINERALS) TABS tablet Take 1 tablet by mouth daily.   RESTASIS 0.05 % ophthalmic emulsion Place 1 drop into both eyes 2 (two) times daily.   triamcinolone cream (KENALOG) 0.1 % Apply 1 application. topically 2 (two) times daily.   umeclidinium-vilanterol (ANORO ELLIPTA) 62.5-25 MCG/ACT AEPB INHALE 1 PUFF BY MOUTH EVERY DAY   vitamin  C (ASCORBIC ACID) 500 MG tablet Take 500 mg by mouth daily.   vitamin E 180 MG (400 UNITS) capsule Take 400 Units by mouth daily.   zolpidem (AMBIEN) 5 MG tablet TAKE 1 TABLET BY MOUTH AT BEDTIME AS NEEDED FOR SLEEP.   sodium chloride 1 g tablet Take 1 tablet (1 g total) by mouth 3 (three) times daily with meals. (Patient taking differently: Take 1 g by mouth 2 (two) times daily with a meal.)   No facility-administered encounter medications on file as of 09/21/2021.    Allergies (verified) Patient has no known allergies.   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    Past Surgical History:  Procedure Laterality Date   CHOLECYSTECTOMY     CHOLECYSTECTOMY, LAPAROSCOPIC     COLONOSCOPY     ESOPHAGOGASTRODUODENOSCOPY     lesion, vulva excision     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   Procedure: PACEMAKER IMPLANT;  Surgeon: [name], MD;  Location: [facility];  Service: Cardiovascular;  Laterality: N/A;   TONSILLECTOMY     VIDEO BRONCHOSCOPY Bilateral 11/06/2017   Procedure: VIDEO BRONCHOSCOPY WITHOUT FLUORO;  Surgeon: Marshell Garfinkel, MD;  Location: [facility];  Service: Cardiopulmonary;  Laterality: Bilateral;   Family History  Problem Relation Age of Onset   Stroke Mother        Deceased, 28s   Atrial fibrillation Father    Colon cancer Father    Dementia Sister    Other Sister        Pick's disease, deceased   Healthy Daughter    Social History   Socioeconomic History    Marital status: Married    Spouse name: Not on file   Number of children: 2   Years of education: 12   Highest education level: Not on file  Occupational History   Occupation: Education administrator business   Occupation: caregiver  Tobacco Use   Smoking status: Former    Packs/day: 0.25    Years: 30.00    Pack years: 7.50    Types: Cigarettes    Quit date: 01/21/2020    Years since quitting: 1.6   Smokeless tobacco: Never   Tobacco comments:    smokes "every now and then" 08/24/20 [facility]   Vaping Use   Vaping Use: Never used  Substance and Sexual  Activity   Alcohol use: Yes    Alcohol/week: 5.0 standard drinks    Types: 5 Standard drinks or equivalent per week    Comment: 4 out of 7 dyas per husband at bedside   Drug use: No   Sexual activity: Yes  Other Topics Concern   Not on file  Social History Narrative   HSG. Married '71. 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       One level home with spouse   Right handed   Caffeine - coffee 2-3 cups am   Exercise - treadmill    Education - 12th grade   Social Determinants of Health   Financial Resource Strain: Low Risk    Difficulty of Paying Living Expenses: Not hard at all  Food Insecurity: No Food Insecurity   Worried About Charity fundraiser in the Last Year: Never true   Ran Out of Food in the Last Year: Never true  Transportation Needs: No Transportation Needs   Lack of Transportation (Medical): No   Lack of Transportation (Non-Medical): No  Physical Activity: Inactive   Days of Exercise per Week: 0 days   Minutes of Exercise per Session: 0 min  Stress: No Stress Concern Present   Feeling of Stress : Not at all  Social Connections: Moderately Integrated   Frequency of Communication with Friends and Family: Three times a week   Frequency of Social Gatherings with Friends and Family: Three times a week   Attends Religious Services: 1 to 4  times per year   Active Member of Clubs or Organizations: No   Attends Archivist Meetings: Never   Marital Status: Married    Tobacco Counseling Counseling given: Not Answered Tobacco comments: smokes "every now and then" 08/24/20 [facility]    Clinical Intake:  Pre-visit preparation completed: Yes  Pain : No/denies pain     Nutritional Risks: None Diabetes: No  How often do you need to have someone help you when you read instructions, pamphlets, or other written materials from your doctor or pharmacy?: 1 - Never What is the last grade level you completed in school?: High School  Diabetic?no   Interpreter Needed?: No  Information entered by :: L.Jasiyah Poland,LPN   Activities of Daily Living    09/21/2021    1:40 PM 04/24/2021   12:00 AM  In your present state of health, do you have any difficulty performing the following activities:  Hearing? 0 1  Vision? 0 0  Difficulty concentrating or making decisions? 0 0  Walking or climbing stairs? 0 1  Dressing or bathing? 0 0  Doing errands, shopping? 0 1  Preparing Food and eating ? N   Using the Toilet? N   In the past six months, have you accidently leaked urine? N   Do you have problems with loss of bowel control? N   Managing your Medications? N   Managing your Finances? N   Housekeeping or managing your Housekeeping? N     Patient Care Team: Hoyt Koch, MD as PCP - General (Internal Medicine) Elouise Munroe, MD as PCP - Cardiology (Cardiology) [name], MD as PCP - Electrophysiology (Cardiology) Gavin Pound, MD (Rheumatology) Gus Height, MD (Inactive) (Obstetrics and Gynecology) Alda Berthold, DO as Consulting Physician (Neurology) Delice Bison Darnelle Maffucci, [facility] as Pharmacist (Pharmacist)  Indicate any recent Medical Services you may have received from other than [facility] providers in  the past year (date may be approximate).     Assessment:   This is a routine wellness examination for  Sheila Morales.  Hearing/Vision screen Vision Screening - Comments:: Annual eye exams wear glasses   Dietary issues and exercise activities discussed: Current Exercise Habits: The patient does not participate in regular exercise at present, Exercise limited by: orthopedic condition(s)   Goals Addressed   None    Depression Screen    09/21/2021    1:37 PM 09/21/2021    1:35 PM 09/18/2021    9:53 AM 08/14/2021    9:38 AM 04/10/2021    9:16 AM 04/03/2020    8:59 AM 09/27/2019    7:56 AM  PHQ 2/9 Scores  PHQ - 2 Score 0 0 3 1 0 1 0  PHQ- 9 Score   9 5       Fall Risk    09/21/2021    1:37 PM 09/18/2021    9:49 AM 08/14/2021    9:38 AM 08/07/2021    1:33 PM 04/10/2021    9:15 AM  Fall Risk   Falls in the past year? '1 1 1 1 '$ 0  Number falls in past yr: 0 '1 1 1 '$ 0  Injury with Fall? '1 1 1 1 '$ 0  Risk for fall due to :  History of fall(s);Impaired balance/gait     Follow up Falls evaluation completed;Education provided        FALL RISK PREVENTION PERTAINING TO THE HOME:  Any stairs in or around the home? No  If so, are there any without handrails? No  Home free of loose throw rugs in walkways, pet beds, electrical cords, etc? Yes  Adequate lighting in your home to reduce risk of falls? Yes   ASSISTIVE DEVICES UTILIZED TO PREVENT FALLS:  Life alert? No  Use of a cane, walker or w/c? Yes  Grab bars in the bathroom? No  Shower chair or bench in shower? Yes  Elevated toilet seat or a handicapped toilet? Yes    Cognitive Function:  Normal cognitive status assessed by telephone conversation  by this Nurse Health Advisor. No abnormalities found.        Immunizations Immunization History  Administered Date(s) Administered   Fluad Quad(high Dose 65+) 03/29/2019   H1N1 05/31/2008   Influenza Split 02/13/2012   Influenza Whole 01/21/2008, 01/17/2009   Influenza, High Dose Seasonal PF 02/19/2016, 02/05/2018   Influenza,inj,Quad PF,6+ Mos 01/21/2014   Influenza-Unspecified 02/02/2015,  02/03/2017   Pneumococcal Conjugate-13 02/19/2016   Pneumococcal Polysaccharide-23 02/24/2017   Tdap 03/29/2019    TDAP status: Up to date  Flu Vaccine status: Declined, Education has been provided regarding the importance of this vaccine but patient still declined. Advised may receive this vaccine at local pharmacy or Health Dept. Aware to provide a copy of the vaccination record if obtained from local pharmacy or Health Dept. Verbalized acceptance and understanding.  Pneumococcal vaccine status: Up to date  Covid-19 vaccine status: Completed vaccines  Qualifies for Shingles Vaccine? Yes   Zostavax completed No   Shingrix Completed?: No.    Education has been provided regarding the importance of this vaccine. Patient has been advised to call insurance company to determine out of pocket expense if they have not yet received this vaccine. Advised may also receive vaccine at local pharmacy or Health Dept. Verbalized acceptance and understanding.  Screening Tests Health Maintenance  Topic Date Due   COVID-19 Vaccine (1) Never done   Zoster Vaccines- Shingrix (1 of 2) Never  done   MAMMOGRAM  02/26/2017   INFLUENZA VACCINE  11/20/2021   COLONOSCOPY (Pts 45-54yr Insurance coverage will need to be confirmed)  07/04/2024   TETANUS/TDAP  03/28/2029   Pneumonia Vaccine 72 Years old  Completed   DEXA SCAN  Completed   Hepatitis C Screening  Completed   HPV VACCINES  Aged Out    Health Maintenance  Health Maintenance Due  Topic Date Due   COVID-19 Vaccine (1) Never done   Zoster Vaccines- Shingrix (1 of 2) Never done   MAMMOGRAM  02/26/2017    Colorectal cancer screening: Type of screening: Colonoscopy. Completed 07/05/2014. Repeat every 10 years  Mammogram status: Ordered declined at this time . Pt provided with contact info and advised to call to schedule appt.   Bone Density status: Completed 04/06/2019. Results reflect: Bone density results: OSTEOPENIA. Repeat every 5  years.  Lung Cancer Screening: (Low Dose CT Chest recommended if Age 72-80years, 30 pack-year currently smoking OR have quit w/in 15years.) does not qualify.   Lung Cancer Screening Referral: n/a  Additional Screening:  Hepatitis C Screening: does not qualify;   Vision Screening: Recommended annual ophthalmology exams for early detection of glaucoma and other disorders of the eye. Is the patient up to date with their annual eye exam?  Yes  Who is the provider or what is the name of the office in which the patient attends annual eye exams? Dr.Matthews  If pt is not established with a provider, would they like to be referred to a provider to establish care? No .   Dental Screening: Recommended annual dental exams for proper oral hygiene  Community Resource Referral / Chronic Care Management: CRR required this visit?  No   CCM required this visit?  No      Plan:     I have personally reviewed and noted the following in the patient's chart:   Medical and social history Use of alcohol, tobacco or illicit drugs  Current medications and supplements including opioid prescriptions.  Functional ability and status Nutritional status Physical activity Advanced directives List of other physicians Hospitalizations, surgeries, and ER visits in previous 12 months Vitals Screenings to include cognitive, depression, and falls Referrals and appointments  In addition, I have reviewed and discussed with patient certain preventive protocols, quality metrics, and best practice recommendations. A written personalized care plan for preventive services as well as general preventive health recommendations were provided to patient.     LRandel Pigg LPN   [id number]  Nurse Notes: none   Medical screening examination/treatment/procedure(s) were performed by non-physician practitioner and as supervising physician I was immediately available for consultation/collaboration.  I agree with above.  ALew Dawes MD

## 2021-09-21 NOTE — Patient Instructions (Signed)
Ms. Sheila Morales , Thank you for taking time to come for your Medicare Wellness Visit. I appreciate your ongoing commitment to your health goals. Please review the following plan we discussed and let me know if I can assist you in the future.   Screening recommendations/referrals: Colonoscopy: 07/05/2014 Mammogram: declined st thid time will contact PCP  Bone Density: 04/06/2019 Recommended yearly ophthalmology/optometry visit for glaucoma screening and checkup Recommended yearly dental visit for hygiene and checkup  Vaccinations: Influenza vaccine: declined  Pneumococcal vaccine: completed  Tdap vaccine: 03/29/2019 Shingles vaccine: will consider     Advanced directives: yes   Conditions/risks identified: none   Next appointment: none    Preventive Care 72 Years and Older, Female Preventive care refers to lifestyle choices and visits with your health care provider that can promote health and wellness. What does preventive care include? A yearly physical exam. This is also called an annual well check. Dental exams once or twice a year. Routine eye exams. Ask your health care provider how often you should have your eyes checked. Personal lifestyle choices, including: Daily care of your teeth and gums. Regular physical activity. Eating a healthy diet. Avoiding tobacco and drug use. Limiting alcohol use. Practicing safe sex. Taking low-dose aspirin every day. Taking vitamin and mineral supplements as recommended by your health care provider. What happens during an annual well check? The services and screenings done by your health care provider during your annual well check will depend on your age, overall health, lifestyle risk factors, and family history of disease. Counseling  Your health care provider may ask you questions about your: Alcohol use. Tobacco use. Drug use. Emotional well-being. Home and relationship well-being. Sexual activity. Eating habits. History of  falls. Memory and ability to understand (cognition). Work and work Statistician. Reproductive health. Screening  You may have the following tests or measurements: Height, weight, and BMI. Blood pressure. Lipid and cholesterol levels. These may be checked every 5 years, or more frequently if you are over 25 years old. Skin check. Lung cancer screening. You may have this screening every year starting at age 93 if you have a 30-pack-year history of smoking and currently smoke or have quit within the past 15 years. Fecal occult blood test (FOBT) of the stool. You may have this test every year starting at age 52. Flexible sigmoidoscopy or colonoscopy. You may have a sigmoidoscopy every 5 years or a colonoscopy every 10 years starting at age 19. Hepatitis C blood test. Hepatitis B blood test. Sexually transmitted disease (STD) testing. Diabetes screening. This is done by checking your blood sugar (glucose) after you have not eaten for a while (fasting). You may have this done every 1-3 years. Bone density scan. This is done to screen for osteoporosis. You may have this done starting at age 9. Mammogram. This may be done every 1-2 years. Talk to your health care provider about how often you should have regular mammograms. Talk with your health care provider about your test results, treatment options, and if necessary, the need for more tests. Vaccines  Your health care provider may recommend certain vaccines, such as: Influenza vaccine. This is recommended every year. Tetanus, diphtheria, and acellular pertussis (Tdap, Td) vaccine. You may need a Td booster every 10 years. Zoster vaccine. You may need this after age 21. Pneumococcal 13-valent conjugate (PCV13) vaccine. One dose is recommended after age 53. Pneumococcal polysaccharide (PPSV23) vaccine. One dose is recommended after age 69. Talk to your health care provider about which screenings  and vaccines you need and how often you need  them. This information is not intended to replace advice given to you by your health care provider. Make sure you discuss any questions you have with your health care provider. Document Released: 05/05/2015 Document Revised: 12/27/2015 Document Reviewed: 02/07/2015 Elsevier Interactive Patient Education  2017 [employer] Prevention in the Home Falls can cause injuries. They can happen to people of all ages. There are many things you can do to make your home safe and to help prevent falls. What can I do on the outside of my home? Regularly fix the edges of walkways and driveways and fix any cracks. Remove anything that might make you trip as you walk through a door, such as a raised step or threshold. Trim any bushes or trees on the path to your home. Use bright outdoor lighting. Clear any walking paths of anything that might make someone trip, such as rocks or tools. Regularly check to see if handrails are loose or broken. Make sure that both sides of any steps have handrails. Any raised decks and porches should have guardrails on the edges. Have any leaves, snow, or ice cleared regularly. Use sand or salt on walking paths during winter. Clean up any spills in your garage right away. This includes oil or grease spills. What can I do in the bathroom? Use night lights. Install grab bars by the toilet and in the tub and shower. Do not use towel bars as grab bars. Use non-skid mats or decals in the tub or shower. If you need to sit down in the shower, use a plastic, non-slip stool. Keep the floor dry. Clean up any water that spills on the floor as soon as it happens. Remove soap buildup in the tub or shower regularly. Attach bath mats securely with double-sided non-slip rug tape. Do not have throw rugs and other things on the floor that can make you trip. What can I do in the bedroom? Use night lights. Make sure that you have a light by your bed that is easy to reach. Do not use  any sheets or blankets that are too big for your bed. They should not hang down onto the floor. Have a firm chair that has side arms. You can use this for support while you get dressed. Do not have throw rugs and other things on the floor that can make you trip. What can I do in the kitchen? Clean up any spills right away. Avoid walking on wet floors. Keep items that you use a lot in easy-to-reach places. If you need to reach something above you, use a strong step stool that has a grab bar. Keep electrical cords out of the way. Do not use floor polish or wax that makes floors slippery. If you must use wax, use non-skid floor wax. Do not have throw rugs and other things on the floor that can make you trip. What can I do with my stairs? Do not leave any items on the stairs. Make sure that there are handrails on both sides of the stairs and use them. Fix handrails that are broken or loose. Make sure that handrails are as long as the stairways. Check any carpeting to make sure that it is firmly attached to the stairs. Fix any carpet that is loose or worn. Avoid having throw rugs at the top or bottom of the stairs. If you do have throw rugs, attach them to the floor with carpet tape.  Make sure that you have a light switch at the top of the stairs and the bottom of the stairs. If you do not have them, ask someone to add them for you. What else can I do to help prevent falls? Wear shoes that: Do not have high heels. Have rubber bottoms. Are comfortable and fit you well. Are closed at the toe. Do not wear sandals. If you use a stepladder: Make sure that it is fully opened. Do not climb a closed stepladder. Make sure that both sides of the stepladder are locked into place. Ask someone to hold it for you, if possible. Clearly mark and make sure that you can see: Any grab bars or handrails. First and last steps. Where the edge of each step is. Use tools that help you move around (mobility aids)  if they are needed. These include: Canes. Walkers. Scooters. Crutches. Turn on the lights when you go into a dark area. Replace any light bulbs as soon as they burn out. Set up your furniture so you have a clear path. Avoid moving your furniture around. If any of your floors are uneven, fix them. If there are any pets around you, be aware of where they are. Review your medicines with your doctor. Some medicines can make you feel dizzy. This can increase your chance of falling. Ask your doctor what other things that you can do to help prevent falls. This information is not intended to replace advice given to you by your health care provider. Make sure you discuss any questions you have with your health care provider. Document Released: 02/02/2009 Document Revised: 09/14/2015 Document Reviewed: 05/13/2014 Elsevier Interactive Patient Education  2017 [employer].

## 2021-09-25 ENCOUNTER — Telehealth: Payer: Self-pay | Admitting: Internal Medicine

## 2021-09-25 ENCOUNTER — Telehealth (HOSPITAL_COMMUNITY): Payer: Self-pay | Admitting: Emergency Medicine

## 2021-09-25 DIAGNOSIS — I480 Paroxysmal atrial fibrillation: Secondary | ICD-10-CM

## 2021-09-25 MED ORDER — APIXABAN 5 MG PO TABS: 5.0000 mg | ORAL_TABLET | Freq: Two times a day (BID) | ORAL | 0 refills | Status: DC

## 2021-09-25 NOTE — Telephone Encounter (Signed)
Pt c/o medication issue:  1. Name of Medication:  apixaban (ELIQUIS) 5 MG TABS tablet  2. How are you currently taking this medication (dosage and times per day)?  Twice daily, as prescribed  3. Are you having a reaction (difficulty breathing--STAT)?   4. What is your medication issue?   Patient states, per [facility], she is no longer eligible for refills for Eliquis. She is not out of medication yet, but she would like to know what this will mean going forward. Please return call to discuss.

## 2021-09-25 NOTE — Telephone Encounter (Signed)
Reaching out to patient to offer assistance regarding upcoming cardiac imaging study; pt verbalizes understanding of appt date/time, parking situation and where to check in, pre-test NPO status and medications ordered, and verified current allergies; name and call back number provided for further questions should they arise Marchia Bond RN Navigator Cardiac Imaging [facility] [phone] office [phone] cell  Denies implants other than PPM (biotronik Barrett [employer] 8DR-T SN [id number] [email or web address]  Denies claustro Denies iv issues Brain MR  + Cardiac MR  Arrival 1130

## 2021-09-25 NOTE — Telephone Encounter (Signed)
Refill for Eliquis sent in.  90 day supply sent in due to patient's renal function. Still qualifies for '5mg'$  BID dosage.

## 2021-09-26 ENCOUNTER — Telehealth: Payer: Self-pay | Admitting: Pulmonary Disease

## 2021-09-26 ENCOUNTER — Ambulatory Visit (HOSPITAL_COMMUNITY)
Admission: RE | Admit: 2021-09-26 | Discharge: 2021-09-26 | Disposition: A | Discharge status: Home / Self Care | Payer: PPO | Source: Ambulatory Visit | Attending: Internal Medicine | Admitting: Internal Medicine

## 2021-09-26 ENCOUNTER — Ambulatory Visit (HOSPITAL_COMMUNITY)
Admission: RE | Admit: 2021-09-26 | Discharge: 2021-09-26 | Disposition: A | Discharge status: Home / Self Care | Payer: PPO | Source: Ambulatory Visit | Attending: Neurology | Admitting: Neurology

## 2021-09-26 DIAGNOSIS — R270 Ataxia, unspecified: Secondary | ICD-10-CM

## 2021-09-26 DIAGNOSIS — I340 Nonrheumatic mitral (valve) insufficiency: Secondary | ICD-10-CM

## 2021-09-26 DIAGNOSIS — R413 Other amnesia: Secondary | ICD-10-CM | POA: Diagnosis not present

## 2021-09-26 DIAGNOSIS — R251 Tremor, unspecified: Secondary | ICD-10-CM | POA: Insufficient documentation

## 2021-09-26 DIAGNOSIS — R0609 Other forms of dyspnea: Secondary | ICD-10-CM | POA: Insufficient documentation

## 2021-09-26 MED ORDER — GADOBUTROL 1 MMOL/ML IV SOLN
8.0000 mL | Freq: Once | INTRAVENOUS | Status: AC | PRN
Start: 2021-09-26 — End: 2021-09-26
  Administered 2021-09-26: 8 mL via INTRAVENOUS

## 2021-09-26 NOTE — Progress Notes (Signed)
Patient here today at [facility] for MRI brain wo contast and Cardiac w wo contrast. Patient has biotronik device. Adam-rep at the bedside to program patient. DOO 80, auto-detection on.

## 2021-09-27 ENCOUNTER — Encounter: Payer: Self-pay | Admitting: Internal Medicine

## 2021-09-27 ENCOUNTER — Ambulatory Visit (INDEPENDENT_AMBULATORY_CARE_PROVIDER_SITE_OTHER): Payer: PPO | Admitting: Internal Medicine

## 2021-09-27 VITALS — BP 120/72 | HR 70 | Ht 62.0 in | Wt 131.8 lb

## 2021-09-27 DIAGNOSIS — R600 Localized edema: Secondary | ICD-10-CM

## 2021-09-27 DIAGNOSIS — Z950 Presence of cardiac pacemaker: Secondary | ICD-10-CM

## 2021-09-27 DIAGNOSIS — R0609 Other forms of dyspnea: Secondary | ICD-10-CM

## 2021-09-27 DIAGNOSIS — I5032 Chronic diastolic (congestive) heart failure: Secondary | ICD-10-CM | POA: Diagnosis not present

## 2021-09-27 DIAGNOSIS — I495 Sick sinus syndrome: Secondary | ICD-10-CM

## 2021-09-27 DIAGNOSIS — D6869 Other thrombophilia: Secondary | ICD-10-CM

## 2021-09-27 DIAGNOSIS — E222 Syndrome of inappropriate secretion of antidiuretic hormone: Secondary | ICD-10-CM

## 2021-09-27 DIAGNOSIS — E871 Hypo-osmolality and hyponatremia: Secondary | ICD-10-CM

## 2021-09-27 DIAGNOSIS — J449 Chronic obstructive pulmonary disease, unspecified: Secondary | ICD-10-CM

## 2021-09-27 DIAGNOSIS — I10 Essential (primary) hypertension: Secondary | ICD-10-CM

## 2021-09-27 DIAGNOSIS — R531 Weakness: Secondary | ICD-10-CM | POA: Diagnosis not present

## 2021-09-27 DIAGNOSIS — Z79899 Other long term (current) drug therapy: Secondary | ICD-10-CM | POA: Diagnosis not present

## 2021-09-27 DIAGNOSIS — I480 Paroxysmal atrial fibrillation: Secondary | ICD-10-CM

## 2021-09-27 DIAGNOSIS — R5383 Other fatigue: Secondary | ICD-10-CM

## 2021-09-27 MED ORDER — EMPAGLIFLOZIN 10 MG PO TABS: 10.0000 mg | ORAL_TABLET | Freq: Every day | ORAL | 5 refills | Status: DC

## 2021-09-27 NOTE — Patient Instructions (Signed)
Medication Instructions:  START: JARDIANCE '10mg'$  ONCE DAILY  *If you need a refill on your cardiac medications before your next appointment, please call your pharmacy*  Follow-Up: At [facility], you and your health needs are our priority.  As part of our continuing mission to provide you with exceptional heart care, we have created designated Provider Care Teams.  These Care Teams include your primary Cardiologist (physician) and Advanced Practice Providers (APPs -  Physician Assistants and Nurse Practitioners) who all work together to provide you with the care you need, when you need it.  Your next appointment:   6 month(s)  The format for your next appointment:   In Person  Provider:   Elouise Munroe, MD    Other Instructions REFERRAL TO [place]

## 2021-09-27 NOTE — Progress Notes (Signed)
Cardiology Office Note:    Date:  09/27/2021  ID:  Sheila Morales, DOB 03/10/1951, MRN [id number]  PCP:  Hoyt Koch, MD  Cardiologist:  Elouise Munroe, MD  Electrophysiologist:  [name], MD   Referring MD: Hoyt Koch, *   Chief Complaint/Reason for Referral: Bradycardia, dizziness s/p PPM  History of Present Illness:    Sheila Morales is a 71 y.o. female with a history of COPD, SLE on methorexate, BPPV, and HTN who presents for follow up. Her husband [name] joins her for the visit today.  Implantation of a Biotronik dual-chamber pacemaker for symptomatic bradycardia due to sinus node dysfunction 05/26/20. Normal device interrogations.   08/08/21: Over the last several months she has had a complex history involving 2 hospitalizations.  She was initially hospitalized November 30 through December 11 with fatigue and confusion and profound hyponatremia to 113.  Noted to have E. coli bacteremia with UTI and right-sided pyelonephritis by CT imaging.  She had new onset rapid atrial fibrillation during that hospital stay and acute congestive heart failure (HFpEF), heart failure necessitating BiPAP in the intensive care unit.  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  She returned to the hospital on April 22, 2021 with fever and confusion as well as lethargy, myalgias and fatigue.  She was also noted to have intermittent fevers.  Admitted for acute metabolic encephalopathy, and also had stool positive for occult blood.  Pulmonology recommended long-term prednisone taper and PJP prophylaxis.  Patient continues on methotrexate and hydroxychloroquine for lupus.  Eliquis had been initiated during her prior hospital stay for atrial fibrillation, and at her subsequent hospitalization she was noted to have acute blood loss anemia with a hemoglobin of  7.3.  Felt to have slow gastrointestinal bleed from Eliquis.  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  She had a peripheral eosinophilia.  Symptoms respond strongly to prednisone.  Bronchoscopy considered but deferred due to tenuous clinical status in the hospital.  Consideration also for methotrexate lung toxicity as well as lupus related ILD.  She presented to the ED 07/29/2021 after a mechanical fall thought to have occurred because of ongoing nonfocal weakness and pivoting too quickly. She had fallen the previous day as well. She was noted to have two transverse process fractures as well as nondisplaced fracture of her left 11th rib. Medicated with percocet and discharged with prescription for pain medication.  Her husband provides the majority of the history today. Today, she continues to suffer from significant pain due to her rib fracture. She has tried to substitute tylenol instead of percocet, but they note tylenol is not strong enough to manage her pain. I recommended she contact her PCP for assistance with finding a medicinal alternative. She has been off of prednisone for about 3 weeks. Of note, they report she had facial edema while she was on prednisone. This has improved today. UA negative for protein recently, less likely nephritic swelling.  Generally when she is retaining fluid, she has edema in her legs. She states that her legs are constantly a little bit swollen. The edema seems to stay localized around her ankles, and has not progressed proximally. About three weeks ago she was started on a Lasix 40 mg daily and started on potassium. Since  then, her husband has noticed she is more unstable, has more confusion, and is severely fatigued and short of breath with minimal exertion such as walking to this office from the parking lot. It is difficult for her to determine if she  is feeling better or worse with the higher dose of Lasix. Her urine production is only somewhat increased. Today she believes she is okay regarding fluid retention, she did take her morning dose of Lasix so far. Typically she drinks around 50 oz of water a day. Intended to follow a fluid restriction due to SIADH and hyponatremia.  Lately they have noticed some weight gain. This has been gradual in the past 7-8 weeks. They attribute her increased weight due to her recent health issues preventing her from formal exercise. She is limited to sitting due to her pain. Previously she was able to walk at 1.7 mph for 5-10 min on a treadmill, but is not able to do so at this time.  Her husband notes in the past week she has been in Atrial fibrillation. This was the first episode in a while.  Additionally she complains of sudden, intermittent episodes of body tremors that mostly affect her extremities.  Denies palpitations, PND, orthopnea.  Denies syncope or presyncope.   They plan to take a vacation to [place] in June "come hell or high water".  FOLLOW UP 08/14/21:  Appears brighter and feeling slightly better particularly since decreasing lasix and rib fractures are healing. LE edema is stable with decreased dose of lasix. We discontinued potassium at last office visit and K is stable on labs today. Sodium improved, Dr. Sharlet Salina told them this AM to discontinue salt tabs. Cr improved since last visit with decrease in lasix. Reviewed future testing again including CMR and PET-CT perfusion upcoming.  09/27/21:  Husband feels she has improved in the last 2 weeks.  Her weight is steadily at 126 to 128 pounds.  The compression socks worked well and currently she has no lower extremity swelling.  She has continued on Lasix 20 mg daily.  We reviewed the results of her PET/CT myocardial perfusion study which was normal. Cardiac MRI showed: "Midmyocardial circumferential stripe of delayed enhancement at the base.  Midmyocardial delayed enhancement in the lateral wall at the mid ventricle. Findings are nonspecific. Overall, findings may represent hypertensive heart disease. However, patient is chronically on plaquenil and this constellation of findings in combination with clinical presentation does not exclude plaquenil cardiomyopathy." We discussed that I will mention these findings to her rheumatologist and discuss if stopping plaquenil would be an option.  Discussed starting jardience for HFpEF symptoms.   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     Past Surgical History:  Procedure Laterality Date   CHOLECYSTECTOMY     CHOLECYSTECTOMY, LAPAROSCOPIC     COLONOSCOPY     ESOPHAGOGASTRODUODENOSCOPY     lesion, vulva excision     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  Procedure: PACEMAKER IMPLANT;  Surgeon: [name], MD;  Location: [facility];  Service: Cardiovascular;  Laterality: N/A;   TONSILLECTOMY     VIDEO BRONCHOSCOPY Bilateral 11/06/2017   Procedure: VIDEO BRONCHOSCOPY WITHOUT FLUORO;  Surgeon: Marshell Garfinkel, MD;  Location: [facility];  Service: Cardiopulmonary;  Laterality: Bilateral;    Current Medications: Current Meds  Medication Sig   albuterol (VENTOLIN HFA) 108 (90 Base) MCG/ACT inhaler Inhale 1-2 puffs into the lungs every 6 (six) hours as needed for wheezing or shortness of breath.   amiodarone (PACERONE) 200 MG tablet Take 1 tablet (200 mg total) by mouth daily.   apixaban  (ELIQUIS) 5 MG TABS tablet Take 1 tablet (5 mg total) by mouth 2 (two) times daily.   benzonatate (TESSALON) 200 MG capsule Take 1 capsule (200 mg total) by mouth every 6 (six) hours as needed for cough.   calcium carbonate (OS-CAL - DOSED IN MG OF ELEMENTAL CALCIUM) 1250 (500 Ca) MG tablet Take 1 tablet by mouth every evening.   Cholecalciferol (VITAMIN D) 50 MCG (2000 UT) tablet Take 2,000 Units by mouth every evening.   empagliflozin (JARDIANCE) 10 MG TABS tablet Take 1 tablet (10 mg total) by mouth daily before breakfast.   folic acid (FOLVITE) 1 MG tablet Take 1 mg by mouth every evening.   furosemide (LASIX) 20 MG tablet TAKE 1 TABLET (20 MG) BY MOUTH DAILY, TAKE 1 EXTRA TABLET BY MOUTH (20 MG) AS NEEDED FOR INCREASED SWELLING.   gabapentin (NEURONTIN) 300 MG capsule TAKE TWO CAPSULES BY MOUTH EVERY MORNING, TWO CAPSULES EACH AFTERNOON AND THREE CAPSULES AT BEDTIME   hydroxychloroquine (PLAQUENIL) 200 MG tablet Take 200 mg by mouth daily.   methotrexate 2.5 MG tablet Take 10 mg by mouth every Thursday.   metoprolol tartrate (LOPRESSOR) 25 MG tablet Take 1 tablet (25 mg total) by mouth 2 (two) times daily.   Misc Natural Products (ADVANCED JOINT RELIEF) CAPS Take 1 capsule by mouth daily. Joint Advantage Gold 5x   Multiple Vitamin (MULTIVITAMIN WITH MINERALS) TABS tablet Take 1 tablet by mouth daily.   RESTASIS 0.05 % ophthalmic emulsion Place 1 drop into both eyes 2 (two) times daily.   triamcinolone cream (KENALOG) 0.1 % Apply 1 application. topically 2 (two) times daily. (Patient taking differently: Apply 1 application. topically as needed.)   umeclidinium-vilanterol (ANORO ELLIPTA) 62.5-25 MCG/ACT AEPB INHALE 1 PUFF BY MOUTH EVERY DAY   vitamin C (ASCORBIC ACID) 500 MG tablet Take 500 mg by mouth daily.   vitamin E 180 MG (400 UNITS) capsule Take 400 Units by mouth daily.   zolpidem (AMBIEN) 5 MG tablet TAKE 1 TABLET BY MOUTH AT BEDTIME AS NEEDED FOR SLEEP.     Allergies:   Patient has  no known allergies.   Social History   Tobacco Use   Smoking status: Former    Packs/day: 0.25    Years: 30.00    Total pack years: 7.50    Types: Cigarettes    Quit date: 01/21/2020    Years since quitting: 1.6   Smokeless tobacco: Never   Tobacco comments:    smokes "every now and then" 08/24/20 [facility]   Vaping Use   Vaping Use: Never used  Substance Use Topics   Alcohol use: Yes    Alcohol/week: 5.0 standard drinks of alcohol    Types: 5 Standard drinks or equivalent per week    Comment: 4 out of 7 dyas per husband at bedside   Drug use: No  Family History: The patient's family history includes Atrial fibrillation in her father; Colon cancer in her father; Dementia in her sister; Healthy in her daughter; Other in her sister; Stroke in her mother.  ROS:   Please see the history of present illness.    (+) Left costal pain (+) Gait instability (+) Confusion (+) Fatigue (+) Shortness of breath (+) Bilateral LE edema (+) Weight gain (+) Tremors All other systems reviewed and are negative.  EKGs/Labs/Other Studies Reviewed:    The following studies were reviewed today:  Echo 03/22/2021: Sonographer Comments: Technically difficult study due to poor echo  windows.  IMPRESSIONS    1. Left ventricular ejection fraction, by estimation, is 70 to 75%. The  left ventricle has hyperdynamic function. The left ventricle has no  regional wall motion abnormalities. There is mild left ventricular  hypertrophy. Left ventricular diastolic  parameters are consistent with Grade II diastolic dysfunction  (pseudonormalization). Elevated left atrial pressure.   2. Right ventricular systolic function is normal. The right ventricular  size is normal. There is mildly elevated pulmonary artery systolic  pressure.   3. Left atrial size was mildly dilated.   4. The mitral valve is normal in structure. Mild mitral valve  regurgitation. No evidence of mitral stenosis.   5. The aortic valve  is tricuspid. Aortic valve regurgitation is not  visualized. Aortic valve sclerosis/calcification is present, without any  evidence of aortic stenosis.   6. The inferior vena cava is normal in size with greater than 50%  respiratory variability, suggesting right atrial pressure of 3 mmHg.   Comparison(s): No significant change from prior study.  Pacemaker Implant 05/26/2020: CONCLUSIONS:   1. Successful implantation of a Biotronik dual-chamber pacemaker for symptomatic bradycardia due to sinus node dysfunction  2. No early apparent complications.    EKG:  None today. EKG is personally reviewed and interpreted. 08/08/2021: EKG was not ordered. 07/29/2021 (ED): Atrial-paced rhythm at 66 bpm. Borderline T abnormalities, lateral leads.  Recent Labs: 04/25/2021: Magnesium 2.2 07/09/2021: Pro B Natriuretic peptide (BNP) 1,033.0 07/29/2021: ALT 30; B Natriuretic Peptide 757.9; Hemoglobin 11.8; Platelets 208 08/07/2021: TSH 1.86 08/21/2021: BUN 16; Creatinine, Ser 1.25; Potassium 4.2; Sodium 130   Recent Lipid Panel    Component Value Date/Time   CHOL 148 03/29/2019 0909   TRIG 66.0 03/29/2019 0909   HDL 63.60 03/29/2019 0909   CHOLHDL 2 03/29/2019 0909   VLDL 13.2 03/29/2019 0909   LDLCALC 71 03/29/2019 0909   LDLDIRECT 52.0 02/24/2017 1351    Physical Exam:    VS:  BP 120/72   Pulse 70   Ht '5\' 2"'$  (1.575 m)   Wt 131 lb 12.8 oz (59.8 kg)   SpO2 93%   BMI 24.11 kg/m     Wt Readings from Last 5 Encounters:  09/18/21 128 lb (58.1 kg)  09/14/21 131 lb 6.4 oz (59.6 kg)  08/15/21 135 lb (61.2 kg)  08/14/21 136 lb 9.6 oz (62 kg)  08/14/21 131 lb 12.8 oz (59.8 kg)    Constitutional: No acute distress Eyes: sclera non-icteric, normal conjunctiva and lids ENMT: normal dentition, moist mucous membranes Cardiovascular: regular rhythm, normal rate, no murmurs. S1 and S2 normal. No jugular venous distention.   Respiratory: clear to auscultation bilaterally GI : normal bowel sounds, soft and  nontender. No distention.   MSK: extremities warm, well perfused.  Trace ankle edema.  NEURO: grossly nonfocal exam, moves all extremities. PSYCH: alert and oriented x 3, normal mood and affect.   ASSESSMENT:  1. Chronic diastolic heart failure ([place])   2. Dyspnea on exertion   3. AF (paroxysmal atrial fibrillation) ([place])   4. Sinus node dysfunction (HCC)   5. Pacemaker   6. Primary hypertension   7. Lower extremity edema   8. Hyponatremia   9. SIADH (syndrome of inappropriate ADH production) ([place])   10. Medication management   11. Secondary hypercoagulable state ([place])   12. Generalized weakness   13. Chronic obstructive pulmonary disease, unspecified COPD type ([place])   14. Fatigue, unspecified type      PLAN:    Dyspnea on exertion Acute on chronic HFpEF LE edema Facial edema Medication management - better on lasix 20 mg daily, continue for now.  - PET-CT normal - cardiac MRI was negative for amyloid but may indicate plaquenil CM vs HTN heart disease.  - will discuss with rheumatology. Will refer to AHF clinic for further guidance.  - start jardience 10 mg daily for HFpEF.  Atrial fibrillation, paroxysmal Secondary hypercoagulable state -Continue Eliquis 5 mg twice daily. Atrial paced rhythm on recent ECG. -Remains on amiodarone 200 mg daily, diltiazem recently stopped due to symptoms.  Remains on metoprolol tartrate 25 mg twice daily.  Weakness Fatigue Imbalance -No definite cardiovascular etiology for the symptoms.  Bradycardia Dizziness Sinus node dysfunction (HCC) Pacemaker -Device interrogations have been unremarkable for cause for the above-mentioned symptoms.  Hyponatremia - felt to be SIADH. She is on fluid restriction. Salt tabs discontinued per PCP.  Snoring-we have not yet pursued sleep study, patient defers.  Hypertension-blood pressure stable in the office today but vacillates at home. Monitor for now.  Total time of encounter: 30 minutes  total time of encounter, including 20 minutes spent in face-to-face patient care on the date of this encounter. This time includes coordination of care and counseling regarding above mentioned problem list. Remainder of non-face-to-face time involved reviewing chart documents/testing relevant to the patient encounter and documentation in the medical record. I have independently reviewed documentation from referring provider.   Cherlynn Kaiser, MD, [facility]     Medication Adjustments/Labs and Tests Ordered: Current medicines are reviewed at length with the patient today.  Concerns regarding medicines are outlined above.   Orders Placed This Encounter  Procedures   AMB referral to CHF clinic    Meds ordered this encounter  Medications   empagliflozin (JARDIANCE) 10 MG TABS tablet    Sig: Take 1 tablet (10 mg total) by mouth daily before breakfast.    Dispense:  30 tablet    Refill:  5    Patient Instructions  Medication Instructions:  START: JARDIANCE '10mg'$  ONCE DAILY  *If you need a refill on your cardiac medications before your next appointment, please call your pharmacy*  Follow-Up: At [facility], you and your health needs are our priority.  As part of our continuing mission to provide you with exceptional heart care, we have created designated Provider Care Teams.  These Care Teams include your primary Cardiologist (physician) and Advanced Practice Providers (APPs -  Physician Assistants and Nurse Practitioners) who all work together to provide you with the care you need, when you need it.  Your next appointment:   6 month(s)  The format for your next appointment:   In Person  Provider:   Elouise Munroe, MD    Other Instructions REFERRAL TO [place]

## 2021-09-28 ENCOUNTER — Telehealth: Payer: Self-pay

## 2021-09-28 ENCOUNTER — Ambulatory Visit (INDEPENDENT_AMBULATORY_CARE_PROVIDER_SITE_OTHER): Payer: PPO

## 2021-09-28 ENCOUNTER — Encounter: Payer: Self-pay | Admitting: Neurology

## 2021-09-28 ENCOUNTER — Ambulatory Visit (INDEPENDENT_AMBULATORY_CARE_PROVIDER_SITE_OTHER): Payer: PPO | Admitting: Nurse Practitioner

## 2021-09-28 ENCOUNTER — Encounter: Payer: Self-pay | Admitting: Nurse Practitioner

## 2021-09-28 ENCOUNTER — Other Ambulatory Visit: Payer: Self-pay | Admitting: Nurse Practitioner

## 2021-09-28 VITALS — BP 110/78 | HR 60 | Temp 97.9°F | Ht 62.0 in | Wt 131.0 lb

## 2021-09-28 DIAGNOSIS — J449 Chronic obstructive pulmonary disease, unspecified: Secondary | ICD-10-CM | POA: Diagnosis not present

## 2021-09-28 DIAGNOSIS — J209 Acute bronchitis, unspecified: Secondary | ICD-10-CM | POA: Insufficient documentation

## 2021-09-28 DIAGNOSIS — R292 Abnormal reflex: Secondary | ICD-10-CM

## 2021-09-28 DIAGNOSIS — J040 Acute laryngitis: Secondary | ICD-10-CM

## 2021-09-28 DIAGNOSIS — I5032 Chronic diastolic (congestive) heart failure: Secondary | ICD-10-CM | POA: Diagnosis not present

## 2021-09-28 DIAGNOSIS — J440 Chronic obstructive pulmonary disease with acute lower respiratory infection: Secondary | ICD-10-CM | POA: Diagnosis not present

## 2021-09-28 DIAGNOSIS — R059 Cough, unspecified: Secondary | ICD-10-CM | POA: Diagnosis not present

## 2021-09-28 DIAGNOSIS — J849 Interstitial pulmonary disease, unspecified: Secondary | ICD-10-CM | POA: Diagnosis not present

## 2021-09-28 DIAGNOSIS — J3089 Other allergic rhinitis: Secondary | ICD-10-CM

## 2021-09-28 MED ORDER — FLUTICASONE PROPIONATE 50 MCG/ACT NA SUSP: 2.0000 | Freq: Every day | NASAL | 2 refills | Status: DC

## 2021-09-28 MED ORDER — PREDNISONE 20 MG PO TABS: 40.0000 mg | ORAL_TABLET | Freq: Every day | ORAL | 0 refills | Status: AC

## 2021-09-28 MED ORDER — DOXYCYCLINE HYCLATE 100 MG PO TABS: 100.0000 mg | ORAL_TABLET | Freq: Two times a day (BID) | ORAL | 0 refills | Status: AC

## 2021-09-28 MED ORDER — BENZONATATE 200 MG PO CAPS: 200.0000 mg | ORAL_CAPSULE | Freq: Three times a day (TID) | ORAL | 1 refills | Status: DC | PRN

## 2021-09-28 NOTE — Telephone Encounter (Signed)
-----   Message from Pieter Partridge, DO sent at 09/28/2021  6:39 AM EDT ----- MRI of brain shows old changes in the brain such as old strokes - if she has no history of stroke, it is not uncommon for people to have a silent stroke.  For further evaluation of frequent falls and hyperreflexia, I would like to check MRI of cervical spine without contrast - I would like for her to schedule a follow up with me right afterwards (may use work-in) to review everything.

## 2021-09-28 NOTE — Assessment & Plan Note (Addendum)
Possible flare related to worsening allergic rhinitis versus URI.  She also has some laryngitis, consistent with persistent coughing and postnasal drainage.  Prednisone burst prescribed.  Given length of symptoms, we will treat empirically with doxycycline. Cough control measures advised. CXR given her history of eosinophilic pneumonia and CHF exacerbations. Continue LABA/LAMA therapy and PRN albuterol.  Patient Instructions  Continue Albuterol inhaler 2 puffs or 3 mL neb every 6 hours as needed for shortness of breath or wheezing. Notify if symptoms persist despite rescue inhaler/neb use. Continue Anoro 1 puff daily Continue lasix as directed by cardiology   Prednisone 40 mg daily for 5 days Doxycycline 1 tab Twice daily for 7 days. Take with food. Wear sunscreen and avoid direct sun exposure while taking  Mucinex DM twice daily for cough Chlorpheniramine tab 4 mg over the counter At bedtime for cough Tessalon perles (benzonatate) 1 capsule Three times a day for cough Flonase nasal spray 2 sprays each nostril daily for nasal drainage  Frequent sips of water. Hot tea with honey. Throat lozenges throughout the day. Voice rest.  Chest x ray today.  Follow up in 3 months with Dr. Silas Flood. If symptoms do not improve or worsen, please contact office for sooner follow up or seek emergency care.

## 2021-09-28 NOTE — Progress Notes (Signed)
LMOM for patient to call office back. Call back information provided.

## 2021-09-28 NOTE — Progress Notes (Signed)
$'@Patient'f$  ID: Sheila Morales, female    DOB: 24-Feb-1951, 71 y.o.   MRN: [id number]  Chief Complaint  Patient presents with   Follow-up    She is having some cough that is mostly dry and feels like sputum is trying to come up, x 2 weeks,. She has tried delsym and losenges that help some.     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  She is a patient of Dr. Kavin Leech and last seen in office 08/15/2021.  Past medical history significant for CHF, hypertension, A-fib on Eliquis, IBS, BPPV, SLE on methotrexate..  She has a history of infiltrates in the past with area of posterior right upper lobe scarring.  This predated her amiodarone and methotrexate use.  She was then hospitalized in November 2022 with volume overload related to CHF exacerbation and found to have groundglass opacities favored to be most likely be related to pulmonary edema.  There was also concern for ILD at the time.  She was treated with diuretics and placed on prednisone taper at time of discharge.  She completed this and then developed worsening dyspnea, fever and went back to the hospital.  She was found to be hypoxemic, requiring 5 L of supplemental O2.  There were coarse interstitial infiltrates on her chest x-ray so CT chest was obtained for further evaluation.  This showed worsening area of fibrotic changes in the posterior right upper lobe as well as scattered groundglass.  Given her worsening pulmonary status, she was felt to be too ill to safely tolerate bronchoscopy.  Recommended ongoing diuresis and started prednisone at 40 mg daily.  After her first dose her fever subsided, hypoxia resolved and her dyspnea significantly improved.  During the stay she was also noted to have peripheral eosinophilia 400-500.  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  TEST/EVENTS:  08/28/2021 PET/CT cardiac perfusion: Atherosclerosis and CAD is present.  There are prominent mediastinal lymph nodes which are again noted and unchanged none meet criteria for adenopathy.  There is a index right paratracheal lymph node measuring 1.3 cm and unchanged.  Suspected to be reactive.  There is centrilobular and paraseptal emphysema identified.  Signs of chronic postinflammatory changes are again identified within both lungs, demonstrating a peripheral and lower lung zone predominant areas of interstitial reticulation and mild diffuse groundglass attenuation.  There is pleural thickening overlying the lower lobes posteriorly.  No evidence of progression.  08/15/2021: OV with Dr. Silas Flood.  Overall dyspnea stable to improved.  At her last visit she did have some concern for recurrence of eosinophilic pneumonia given symptoms as well as interstitial infiltrates on chest x-ray.  Her BNP was markedly elevated so she was diuresed and found to have improvement of her symptoms.  Completed previous extended prednisone taper and currently off.  Also stop Bactrim when she stopped prednisone.  09/28/2021: Today-acute Patient presents today with husband for acute visit.  She developed a cough around 2 weeks ago which is primarily nonproductive, occasionally will get up some clear sputum.  She has also had some nasal congestion/drainage and has not developed a hoarse voice.  DOE is unchanged from baseline.  She has not noticed any wheezing.  She has not had any recent edema in her lower extremities.  Denies any fevers, chills, night sweats, anorexia, hemoptysis, weight gain.  She continues on Anoro daily.  Has  not required her albuterol inhaler.  She is on Lasix 20 mg daily.  No Known Allergies  Immunization History  Administered Date(s) Administered   Fluad Quad(high Dose 65+) 03/29/2019   H1N1 05/31/2008   Influenza Split 02/13/2012   Influenza Whole 01/21/2008, 01/17/2009    Influenza, High Dose Seasonal PF 02/19/2016, 02/05/2018   Influenza,inj,Quad PF,6+ Mos 01/21/2014   Influenza-Unspecified 02/02/2015, 02/03/2017   Pneumococcal Conjugate-13 02/19/2016   Pneumococcal Polysaccharide-23 02/24/2017   Tdap 03/29/2019    Past Medical History:  Diagnosis Date   Acute respiratory failure with hypoxia ([facility]) 11/05/2017   Arthritis    Colitis, ischemic (HCC)    COPD (chronic obstructive pulmonary disease) (HCC)    External hemorrhoids    H/O: rheumatic fever    IBS (irritable bowel syndrome)    Personal history colonic adenoma 03/25/2008   06/2007 right sided adenoma and 2 right hyperplastic polyps     Restless leg syndrome    questionable   Systemic lupus erythematosus (HCC)    Tubular adenoma of colon    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     Tobacco History: Social History   Tobacco Use  Smoking Status Former   Packs/day: 0.25   Years: 30.00   Total pack years: 7.50   Types: Cigarettes   Quit date: 01/21/2020   Years since quitting: 1.6  Smokeless Tobacco Never  Tobacco Comments   smokes "every now and then" 08/24/20 [facility]    Counseling given: Not Answered Tobacco comments: smokes "every now and then" 08/24/20 [facility]    Outpatient Medications Prior to Visit  Medication Sig Dispense Refill   albuterol (VENTOLIN HFA) 108 (90 Base) MCG/ACT inhaler Inhale 1-2 puffs into the lungs every 6 (six) hours as needed for wheezing or shortness of breath. 8 g 5   amiodarone (PACERONE) 200 MG tablet Take 1 tablet (200 mg total) by mouth daily. 90 tablet 3   apixaban (ELIQUIS) 5 MG TABS tablet Take 1 tablet (5 mg total) by mouth 2 (two) times daily. 180 tablet 0   calcium carbonate (OS-CAL - DOSED IN MG OF ELEMENTAL CALCIUM) 1250 (500 Ca) MG tablet Take 1 tablet by mouth every evening.     Cholecalciferol (VITAMIN D) 50 MCG (2000 UT) tablet Take 2,000 Units by mouth every evening.     empagliflozin (JARDIANCE) 10 MG TABS tablet Take 1 tablet (10 mg total) by  mouth daily before breakfast. 30 tablet 5   folic acid (FOLVITE) 1 MG tablet Take 1 mg by mouth every evening.     furosemide (LASIX) 20 MG tablet TAKE 1 TABLET (20 MG) BY MOUTH DAILY, TAKE 1 EXTRA TABLET BY MOUTH (20 MG) AS NEEDED FOR INCREASED SWELLING. 90 tablet 3   gabapentin (NEURONTIN) 300 MG capsule TAKE TWO CAPSULES BY MOUTH EVERY MORNING, TWO CAPSULES EACH AFTERNOON AND THREE CAPSULES AT BEDTIME 630 capsule 0   hydroxychloroquine (PLAQUENIL) 200 MG tablet Take 200 mg by mouth daily.     methotrexate 2.5 MG tablet Take 10 mg by mouth every Thursday.     metoprolol tartrate (LOPRESSOR) 25 MG tablet Take 1 tablet (25 mg total) by mouth 2 (two) times daily. 180 tablet 0   Misc Natural Products (ADVANCED JOINT RELIEF) CAPS Take 1 capsule by mouth daily. Joint Advantage Gold 5x     Multiple Vitamin (MULTIVITAMIN WITH MINERALS) TABS tablet Take 1 tablet by mouth daily.     RESTASIS 0.05 % ophthalmic emulsion Place 1 drop into both eyes 2 (two) times  daily.  6   triamcinolone cream (KENALOG) 0.1 % Apply 1 application. topically 2 (two) times daily. (Patient taking differently: Apply 1 application  topically as needed.) 30 g 0   umeclidinium-vilanterol (ANORO ELLIPTA) 62.5-25 MCG/ACT AEPB INHALE 1 PUFF BY MOUTH EVERY DAY 60 each 5   vitamin C (ASCORBIC ACID) 500 MG tablet Take 500 mg by mouth daily.     vitamin E 180 MG (400 UNITS) capsule Take 400 Units by mouth daily.     zolpidem (AMBIEN) 5 MG tablet TAKE 1 TABLET BY MOUTH AT BEDTIME AS NEEDED FOR SLEEP. 30 tablet 5   benzonatate (TESSALON) 200 MG capsule Take 1 capsule (200 mg total) by mouth every 6 (six) hours as needed for cough. 30 capsule 1   No facility-administered medications prior to visit.     Review of Systems:   Constitutional: No weight loss or gain, night sweats, fevers, chills, fatigue, or lassitude. HEENT: No headaches, difficulty swallowing, tooth/dental problems, or sore throat. No sneezing, itching, ear ache. +nasal  congestion/drainage (clear), post nasal drip, hoarseness CV:  No chest pain, orthopnea, PND, swelling in lower extremities, anasarca, dizziness, palpitations, syncope Resp: +congested cough. No shortness of breath with exertion or at rest. No excess mucus or change in color of mucus. No hemoptysis. No wheezing.  No chest wall deformity GI:  No heartburn, indigestion, abdominal pain, nausea, vomiting, diarrhea, change in bowel habits, loss of appetite, bloody stools.  Skin: No rash, lesions, ulcerations MSK:  No joint pain or swelling.  No decreased range of motion.  No back pain. Neuro: No dizziness or lightheadedness.  Psych: No depression or anxiety. Mood stable.     Physical Exam:  BP 110/78 (BP Location: Left Arm, Cuff Size: Normal)   Pulse 60   Temp 97.9 F (36.6 C) (Oral)   Ht '5\' 2"'$  (1.575 m)   Wt 131 lb (59.4 kg)   SpO2 95%   BMI 23.96 kg/m   GEN: Pleasant, interactive, well-appearing; in no acute distress. HEENT:  Normocephalic and atraumatic. EACs patent bilaterally. TM pearly gray with present light reflex bilaterally. PERRLA. Sclera white. Nasal turbinates erythematous, moist and patent bilaterally. Clear rhinorrhea present. Oropharynx erythematous and moist, without exudate or edema. No lesions, ulcerations.  NECK:  Supple w/ fair ROM. No JVD present. Normal carotid impulses w/o bruits. Thyroid symmetrical with no goiter or nodules palpated. No lymphadenopathy.   CV: RRR, no m/r/g, no peripheral edema. Pulses intact, +2 bilaterally. No cyanosis, pallor or clubbing. PULMONARY:  Unlabored, regular breathing. Clear bilaterally A&P w/o wheezes/rales/rhonchi. No accessory muscle use. No dullness to percussion. GI: BS present and normoactive. Soft, non-tender to palpation. No organomegaly or masses detected. No CVA tenderness. MSK: No erythema, warmth or tenderness. Cap refil <2 sec all extrem. No deformities or joint swelling noted.  Neuro: A/Ox3. No focal deficits noted.    Skin: Warm, no lesions or rashe Psych: Normal affect and behavior. Judgement and thought content appropriate.     Lab Results:  CBC    Component Value Date/Time   WBC 7.0 07/29/2021 1919   RBC 3.70 (L) 07/29/2021 1919   HGB 11.8 (L) 07/29/2021 1919   HGB 11.9 05/22/2021 0842   HGB 13.1 01/13/2009 1108   HCT 35.5 (L) 07/29/2021 1919   HCT 35.3 05/22/2021 0842   HCT 37.5 01/13/2009 1108   PLT 208 07/29/2021 1919   PLT 194 05/22/2021 0842   MCV 95.9 07/29/2021 1919   MCV 92 05/22/2021 0842   MCV 86.2 01/13/2009  1108   MCH 31.9 07/29/2021 1919   MCHC 33.2 07/29/2021 1919   RDW 15.4 07/29/2021 1919   RDW 14.5 05/22/2021 0842   RDW 12.2 01/13/2009 1108   LYMPHSABS 1.0 07/29/2021 1919   LYMPHSABS 1.2 05/11/2020 1203   LYMPHSABS 2.2 01/13/2009 1108   MONOABS 0.6 07/29/2021 1919   MONOABS 0.7 01/13/2009 1108   EOSABS 0.1 07/29/2021 1919   EOSABS 0.1 05/11/2020 1203   BASOSABS 0.0 07/29/2021 1919   BASOSABS 0.0 05/11/2020 1203   BASOSABS 0.0 01/13/2009 1108    BMET    Component Value Date/Time   NA 130 (L) 08/21/2021 0837   NA 136 08/13/2021 1116   K 4.2 08/21/2021 0837   CL 95 (L) 08/21/2021 0837   CO2 28 08/21/2021 0837   GLUCOSE 99 08/21/2021 0837   BUN 16 08/21/2021 0837   BUN 17 08/13/2021 1116   CREATININE 1.25 (H) 08/21/2021 0837   CREATININE 0.91 11/22/2019 1502   CALCIUM 9.2 08/21/2021 0837   GFRNONAA 44 (L) 07/29/2021 1919   GFRAA 89 05/11/2020 1203    BNP    Component Value Date/Time   BNP 757.9 (H) 07/29/2021 1919     Imaging:  MR BRAIN WO CONTRAST  Result Date: 09/27/2021 CLINICAL DATA:  Ataxia R27.0 (ICD-10-CM). Memory loss R25.1 (ICD-10-CM). Memory deficits R41.3 (ICD-10-CM). EXAM: MRI HEAD WITHOUT CONTRAST TECHNIQUE: Multiplanar, multiecho pulse sequences of the brain and surrounding structures were obtained without intravenous contrast. COMPARISON:  Head CT July 29, 2021. FINDINGS: Brain: No acute infarction, hemorrhage, hydrocephalus,  extra-axial collection or mass lesion. Area of encephalomalacia and gliosis in the periventricular white matter of the left frontal lobe. Scattered and confluent foci of T2 hyperintensity are seen within the white matter the cerebral hemispheres and within the pons, nonspecific likely related to chronic small vessel ischemia. Mild parenchymal volume. Vascular: Normal flow voids. Skull and upper cervical spine: Normal marrow signal. Sinuses/Orbits: Negative. Other: None. IMPRESSION: 1. No acute intracranial. 2. Moderate to advanced chronic microvascular ischemic changes the white matter. 3. Area of encephalomalacia and gliosis in the periventricular white matter of the left frontal lobe. 4. Mild parenchymal volume loss. Electronically Signed   By: Pedro Earls M.D.   On: 09/27/2021 13:16         Latest Ref Rng & Units 02/06/2017    3:09 PM  PFT Results  FVC-Pre L 2.14   FVC-Predicted Pre % 72   FVC-Post L 2.22   FVC-Predicted Post % 74   Pre FEV1/FVC % % 60   Post FEV1/FCV % % 59   FEV1-Pre L 1.28   FEV1-Predicted Pre % 56   FEV1-Post L 1.30   DLCO uncorrected ml/min/mmHg 12.45   DLCO UNC% % 54   DLCO corrected ml/min/mmHg 12.34   DLCO COR %Predicted % 53   DLVA Predicted % 66   TLC L 4.65   TLC % Predicted % 94   RV % Predicted % 107     No results found for: "NITRICOXIDE"      Assessment & Plan:   Acute bronchitis with COPD ([place]) Possible flare related to worsening allergic rhinitis versus URI.  She also has some laryngitis, consistent with persistent coughing and postnasal drainage.  Prednisone burst prescribed.  Given length of symptoms, we will treat empirically with doxycycline. Cough control measures advised. CXR given her history of eosinophilic pneumonia and CHF exacerbations. Continue LABA/LAMA therapy and PRN albuterol.  Patient Instructions  Continue Albuterol inhaler 2 puffs or 3 mL  neb every 6 hours as needed for shortness of breath or wheezing.  Notify if symptoms persist despite rescue inhaler/neb use. Continue Anoro 1 puff daily Continue lasix as directed by cardiology   Prednisone 40 mg daily for 5 days Doxycycline 1 tab Twice daily for 7 days. Take with food. Wear sunscreen and avoid direct sun exposure while taking  Mucinex DM twice daily for cough Chlorpheniramine tab 4 mg over the counter At bedtime for cough Tessalon perles (benzonatate) 1 capsule Three times a day for cough Flonase nasal spray 2 sprays each nostril daily for nasal drainage  Frequent sips of water. Hot tea with honey. Throat lozenges throughout the day. Voice rest.  Chest x ray today.  Follow up in 3 months with Dr. Silas Flood. If symptoms do not improve or worsen, please contact office for sooner follow up or seek emergency care.    Interstitial lung disease ([place]) Her DOE is overall stable and she has not had any recent fevers or issues with oxygenation.  Her cough seems to be more so related to a bronchitis versus upper airway inflammation from possible URI or flare and allergic rhinitis.  However, given her history we will check a CXR to rule out worsening infiltrates or superimposed infection.  Chronic diastolic CHF (congestive heart failure) (HCC) Appears compensated and euvolemic upon exam.  Continue Lasix as previously prescribed.  Follow-up with cardiology as scheduled.   I spent 35 minutes of dedicated to the care of this patient on the date of this encounter to include pre-visit review of records, face-to-face time with the patient discussing conditions above, post visit ordering of testing, clinical documentation with the electronic health record, making appropriate referrals as documented, and communicating necessary findings to members of the patients care team.  Clayton Bibles, NP 09/28/2021  Pt aware and understands NP's role.

## 2021-09-28 NOTE — Telephone Encounter (Signed)
Advised patient and her husband of MRI results, and the recommendation of MRI Cervical Spine.  Will call patient to schedule her f/u with Dr.Jaffe.  PA Form Faxed over to [employer].

## 2021-09-28 NOTE — Assessment & Plan Note (Signed)
Appears compensated and euvolemic upon exam.  Continue Lasix as previously prescribed.  Follow-up with cardiology as scheduled.

## 2021-09-28 NOTE — Patient Instructions (Addendum)
Continue Albuterol inhaler 2 puffs or 3 mL neb every 6 hours as needed for shortness of breath or wheezing. Notify if symptoms persist despite rescue inhaler/neb use. Continue Anoro 1 puff daily Continue lasix as directed by cardiology   Prednisone 40 mg daily for 5 days Doxycycline 1 tab Twice daily for 7 days. Take with food. Wear sunscreen and avoid direct sun exposure while taking  Mucinex DM twice daily for cough Chlorpheniramine tab 4 mg over the counter At bedtime for cough Tessalon perles (benzonatate) 1 capsule Three times a day for cough Flonase nasal spray 2 sprays each nostril daily for nasal drainage  Frequent sips of water. Hot tea with honey. Throat lozenges throughout the day. Voice rest.  Chest x ray today.  Follow up in 3 months with Dr. Silas Flood. If symptoms do not improve or worsen, please contact office for sooner follow up or seek emergency care.

## 2021-09-28 NOTE — Assessment & Plan Note (Signed)
Her DOE is overall stable and she has not had any recent fevers or issues with oxygenation.  Her cough seems to be more so related to a bronchitis versus upper airway inflammation from possible URI or flare and allergic rhinitis.  However, given her history we will check a CXR to rule out worsening infiltrates or superimposed infection.

## 2021-09-28 NOTE — Progress Notes (Signed)
Please notify patient no evidence of acute process or superimposed infection. Her infiltrates from before have actually improved, which is good. Continue our plan as discussed earlier. Thanks.

## 2021-10-01 DIAGNOSIS — D225 Melanocytic nevi of trunk: Secondary | ICD-10-CM | POA: Diagnosis not present

## 2021-10-01 DIAGNOSIS — L930 Discoid lupus erythematosus: Secondary | ICD-10-CM | POA: Diagnosis not present

## 2021-10-01 DIAGNOSIS — D692 Other nonthrombocytopenic purpura: Secondary | ICD-10-CM | POA: Diagnosis not present

## 2021-10-01 DIAGNOSIS — L821 Other seborrheic keratosis: Secondary | ICD-10-CM | POA: Diagnosis not present

## 2021-10-01 DIAGNOSIS — D2372 Other benign neoplasm of skin of left lower limb, including hip: Secondary | ICD-10-CM | POA: Diagnosis not present

## 2021-10-01 DIAGNOSIS — H903 Sensorineural hearing loss, bilateral: Secondary | ICD-10-CM | POA: Diagnosis not present

## 2021-10-03 ENCOUNTER — Telehealth: Payer: Self-pay | Admitting: Internal Medicine

## 2021-10-03 NOTE — Telephone Encounter (Signed)
Patient brought in a jury summons and is requesting a letter excusing her form jury duty due to her health.  Patient would like to be called to pick this form up.

## 2021-10-03 NOTE — Telephone Encounter (Signed)
Form placed in Dr. Charlynne Cousins bin up front

## 2021-10-06 ENCOUNTER — Encounter: Payer: Self-pay | Admitting: Internal Medicine

## 2021-10-09 NOTE — Telephone Encounter (Signed)
We do need more information regarding what health condition she feels will prevent her from jury duty and why. We can then do a letter if we feel appropriate. She will need to pick up form and letter as the form is for her to fill out and she will also need to do a letter and submit this along with our letter per instructions on the form.

## 2021-10-11 NOTE — Telephone Encounter (Signed)
LDVM for pt to please call the office with her health reason and why it is fit to not do [employer] duty.

## 2021-10-15 ENCOUNTER — Telehealth: Payer: PPO | Admitting: Internal Medicine

## 2021-10-17 NOTE — Telephone Encounter (Signed)
Seems like encounter was open in error so closing encounter.  

## 2021-10-18 ENCOUNTER — Encounter: Payer: Self-pay | Admitting: Internal Medicine

## 2021-10-18 ENCOUNTER — Telehealth (INDEPENDENT_AMBULATORY_CARE_PROVIDER_SITE_OTHER): Payer: PPO | Admitting: Internal Medicine

## 2021-10-18 DIAGNOSIS — F5101 Primary insomnia: Secondary | ICD-10-CM | POA: Diagnosis not present

## 2021-10-18 NOTE — Progress Notes (Signed)
Virtual Visit via Audio Note  I connected with Sheila Morales on 10/18/21 at  9:00 AM EDT by an audio-only enabled telemedicine application and verified that I am speaking with the correct person using two identifiers.  The patient and the provider were at separate locations throughout the entire encounter. Patient location: home, Provider location: work   I discussed the limitations of evaluation and management by telemedicine and the availability of in person appointments. The patient expressed understanding and agreed to proceed. The patient and the provider were the only parties present for the visit unless noted in HPI below.  History of Present Illness: The patient is a 71 y.o. female with visit for tiredness and sleep problems.  Observations/Objective: A and O times 3 voice strong  Assessment and Plan: See problem oriented charting  Follow Up Instructions: Reassurance given this is likely viral and no medications needed.   Visit time 11 minutes in non-face to face communication with patient and coordination of care.  I discussed the assessment and treatment plan with the patient. The patient was provided an opportunity to ask questions and all were answered. The patient agreed with the plan and demonstrated an understanding of the instructions.   The patient was advised to call back or seek an in-person evaluation if the symptoms worsen or if the condition fails to improve as anticipated.  Hoyt Koch, MD

## 2021-10-19 NOTE — Assessment & Plan Note (Signed)
Worse since returning from trip to [place] and could be jet lag mixed with viral illness. All rest of trip members sick with URI. She did not get URI symptoms but has fatigue and sleeping more during day. Advised to nap when needed and if new symptoms or lack of improvement to contact us.

## 2021-10-24 ENCOUNTER — Other Ambulatory Visit: Payer: Self-pay

## 2021-10-24 ENCOUNTER — Ambulatory Visit
Admission: EM | Admit: 2021-10-24 | Discharge: 2021-10-24 | Disposition: A | Discharge status: Home / Self Care | Payer: PPO

## 2021-10-24 ENCOUNTER — Encounter (HOSPITAL_BASED_OUTPATIENT_CLINIC_OR_DEPARTMENT_OTHER): Payer: Self-pay | Admitting: Emergency Medicine

## 2021-10-24 ENCOUNTER — Inpatient Hospital Stay (HOSPITAL_BASED_OUTPATIENT_CLINIC_OR_DEPARTMENT_OTHER)
Admission: EM | Admit: 2021-10-24 | Discharge: 2021-10-28 | DRG: 177 | Disposition: A | Discharge status: Home Health | Payer: PPO | Attending: Internal Medicine | Admitting: Internal Medicine

## 2021-10-24 ENCOUNTER — Emergency Department (HOSPITAL_BASED_OUTPATIENT_CLINIC_OR_DEPARTMENT_OTHER): Payer: PPO | Admitting: Radiology

## 2021-10-24 DIAGNOSIS — G4700 Insomnia, unspecified: Secondary | ICD-10-CM | POA: Diagnosis present

## 2021-10-24 DIAGNOSIS — Z79899 Other long term (current) drug therapy: Secondary | ICD-10-CM

## 2021-10-24 DIAGNOSIS — I480 Paroxysmal atrial fibrillation: Secondary | ICD-10-CM | POA: Diagnosis present

## 2021-10-24 DIAGNOSIS — Z28310 Unvaccinated for covid-19: Secondary | ICD-10-CM | POA: Diagnosis not present

## 2021-10-24 DIAGNOSIS — I10 Essential (primary) hypertension: Secondary | ICD-10-CM | POA: Diagnosis present

## 2021-10-24 DIAGNOSIS — U071 COVID-19: Secondary | ICD-10-CM | POA: Diagnosis present

## 2021-10-24 DIAGNOSIS — J9601 Acute respiratory failure with hypoxia: Secondary | ICD-10-CM

## 2021-10-24 DIAGNOSIS — G629 Polyneuropathy, unspecified: Secondary | ICD-10-CM | POA: Diagnosis present

## 2021-10-24 DIAGNOSIS — Z7901 Long term (current) use of anticoagulants: Secondary | ICD-10-CM

## 2021-10-24 DIAGNOSIS — J440 Chronic obstructive pulmonary disease with acute lower respiratory infection: Secondary | ICD-10-CM | POA: Diagnosis present

## 2021-10-24 DIAGNOSIS — R509 Fever, unspecified: Secondary | ICD-10-CM

## 2021-10-24 DIAGNOSIS — G2581 Restless legs syndrome: Secondary | ICD-10-CM | POA: Diagnosis present

## 2021-10-24 DIAGNOSIS — M329 Systemic lupus erythematosus, unspecified: Secondary | ICD-10-CM | POA: Diagnosis present

## 2021-10-24 DIAGNOSIS — I5032 Chronic diastolic (congestive) heart failure: Secondary | ICD-10-CM | POA: Diagnosis present

## 2021-10-24 DIAGNOSIS — Z823 Family history of stroke: Secondary | ICD-10-CM | POA: Diagnosis not present

## 2021-10-24 DIAGNOSIS — Z87891 Personal history of nicotine dependence: Secondary | ICD-10-CM

## 2021-10-24 DIAGNOSIS — J1282 Pneumonia due to coronavirus disease 2019: Secondary | ICD-10-CM | POA: Diagnosis present

## 2021-10-24 DIAGNOSIS — I110 Hypertensive heart disease with heart failure: Secondary | ICD-10-CM | POA: Diagnosis present

## 2021-10-24 DIAGNOSIS — Z950 Presence of cardiac pacemaker: Secondary | ICD-10-CM | POA: Diagnosis present

## 2021-10-24 DIAGNOSIS — J189 Pneumonia, unspecified organism: Secondary | ICD-10-CM | POA: Diagnosis not present

## 2021-10-24 DIAGNOSIS — R112 Nausea with vomiting, unspecified: Secondary | ICD-10-CM | POA: Diagnosis present

## 2021-10-24 DIAGNOSIS — Z7951 Long term (current) use of inhaled steroids: Secondary | ICD-10-CM

## 2021-10-24 DIAGNOSIS — E222 Syndrome of inappropriate secretion of antidiuretic hormone: Secondary | ICD-10-CM | POA: Diagnosis present

## 2021-10-24 DIAGNOSIS — J849 Interstitial pulmonary disease, unspecified: Secondary | ICD-10-CM | POA: Diagnosis present

## 2021-10-24 DIAGNOSIS — R7981 Abnormal blood-gas level: Secondary | ICD-10-CM

## 2021-10-24 DIAGNOSIS — I495 Sick sinus syndrome: Secondary | ICD-10-CM | POA: Diagnosis present

## 2021-10-24 DIAGNOSIS — Z800 Family history of malignant neoplasm of digestive organs: Secondary | ICD-10-CM | POA: Diagnosis not present

## 2021-10-24 DIAGNOSIS — J449 Chronic obstructive pulmonary disease, unspecified: Secondary | ICD-10-CM | POA: Diagnosis present

## 2021-10-24 DIAGNOSIS — Z86010 Personal history of colonic polyps: Secondary | ICD-10-CM | POA: Diagnosis not present

## 2021-10-24 DIAGNOSIS — R051 Acute cough: Secondary | ICD-10-CM | POA: Diagnosis not present

## 2021-10-24 DIAGNOSIS — R918 Other nonspecific abnormal finding of lung field: Secondary | ICD-10-CM | POA: Diagnosis not present

## 2021-10-24 DIAGNOSIS — D72819 Decreased white blood cell count, unspecified: Secondary | ICD-10-CM | POA: Diagnosis present

## 2021-10-24 DIAGNOSIS — E871 Hypo-osmolality and hyponatremia: Secondary | ICD-10-CM | POA: Diagnosis present

## 2021-10-24 LAB — CBC WITH DIFFERENTIAL/PLATELET
Abs Immature Granulocytes: 0.01 10*3/uL (ref 0.00–0.07)
Basophils Absolute: 0 10*3/uL (ref 0.0–0.1)
Basophils Relative: 0 %
Eosinophils Absolute: 0 10*3/uL (ref 0.0–0.5)
Eosinophils Relative: 0 %
HCT: 33.1 % — ABNORMAL LOW (ref 36.0–46.0)
Hemoglobin: 11.1 g/dL — ABNORMAL LOW (ref 12.0–15.0)
Immature Granulocytes: 0 %
Lymphocytes Relative: 21 %
Lymphs Abs: 0.7 10*3/uL (ref 0.7–4.0)
MCH: 31 pg (ref 26.0–34.0)
MCHC: 33.5 g/dL (ref 30.0–36.0)
MCV: 92.5 fL (ref 80.0–100.0)
Monocytes Absolute: 0.4 10*3/uL (ref 0.1–1.0)
Monocytes Relative: 11 %
Neutro Abs: 2.2 10*3/uL (ref 1.7–7.7)
Neutrophils Relative %: 68 %
Platelets: 145 10*3/uL — ABNORMAL LOW (ref 150–400)
RBC: 3.58 MIL/uL — ABNORMAL LOW (ref 3.87–5.11)
RDW: 14.1 % (ref 11.5–15.5)
WBC: 3.3 10*3/uL — ABNORMAL LOW (ref 4.0–10.5)
nRBC: 0 % (ref 0.0–0.2)

## 2021-10-24 LAB — COMPREHENSIVE METABOLIC PANEL
ALT: 33 U/L (ref 0–44)
AST: 34 U/L (ref 15–41)
Albumin: 3.7 g/dL (ref 3.5–5.0)
Alkaline Phosphatase: 71 U/L (ref 38–126)
Anion gap: 11 (ref 5–15)
BUN: 14 mg/dL (ref 8–23)
CO2: 19 mmol/L — ABNORMAL LOW (ref 22–32)
Calcium: 8.7 mg/dL — ABNORMAL LOW (ref 8.9–10.3)
Chloride: 97 mmol/L — ABNORMAL LOW (ref 98–111)
Creatinine, Ser: 1.24 mg/dL — ABNORMAL HIGH (ref 0.44–1.00)
GFR, Estimated: 47 mL/min — ABNORMAL LOW (ref >60)
Glucose, Bld: 96 mg/dL (ref 70–99)
Potassium: 4.1 mmol/L (ref 3.5–5.1)
Sodium: 127 mmol/L — ABNORMAL LOW (ref 135–145)
Total Bilirubin: 0.4 mg/dL (ref 0.3–1.2)
Total Protein: 6.8 g/dL (ref 6.5–8.1)

## 2021-10-24 LAB — RESP PANEL BY RT-PCR (FLU A&B, COVID) ARPGX2
Influenza A by PCR: NEGATIVE
Influenza B by PCR: NEGATIVE
SARS Coronavirus 2 by RT PCR: POSITIVE — AB

## 2021-10-24 MED ORDER — MOLNUPIRAVIR EUA 200MG CAPSULE
4.0000 | ORAL_CAPSULE | Freq: Two times a day (BID) | ORAL | Status: DC
Start: 2021-10-25 — End: 2021-10-28
  Administered 2021-10-25 – 2021-10-28 (×7): 800 mg via ORAL
  Filled 2021-10-24 (×2): qty 4

## 2021-10-24 NOTE — Plan of Care (Signed)
[facility] will assume care on arrival to accepting facility. Until arrival, care as per EDP. However, [facility] available 24/7 for questions and assistance.  Nursing staff, please page [facility] Admits and Consults ([phone]) as soon as the patient arrives to the hospital.   

## 2021-10-24 NOTE — ED Provider Notes (Signed)
[facility] EMERGENCY DEPT Provider Note   CSN: [id number] Arrival date & time: 10/24/21  1734     History  Chief Complaint  Patient presents with   Nasal Congestion   Headache   Fever    Sheila Morales is a 71 y.o. female with history of COPD (not on oxygen at home), CHF, interstitial lung disease, systemic lupus erythematous that presents with 4 days of fever, chills, cough, and nasal congestion. The patient is also complaining of fatigue, rhinorrhea, decreased appetite, and headache. She was seen at an urgent care today and was instructed to visit the emergency department due to labored breathing, systolic BP in the 71I, and oxygen saturation in the high 80s. The husband states that the patient had similar symptoms 1 month ago and visited her pulmonologist.  He states that she was prescribed doxycycline and a prednisone taper at that time.  The patient states that she had leftover doxycycline and took 1 dose last night and 1 dose this morning.  The patient has tried Tylenol and Mucinex at home without relief of her symptoms.  The patient denies abdominal pain, nausea, vomiting, hemoptysis, shortness of breath, chest pain, leg pain, swelling.  The patient denies allergies to any medication.  The history is provided by the patient (and husband).  Headache Associated symptoms: congestion, cough, fatigue and fever   Associated symptoms: no abdominal pain, no diarrhea, no nausea and no vomiting   Fever Associated symptoms: chills, congestion, cough, headaches and rhinorrhea   Associated symptoms: no chest pain, no diarrhea, no nausea and no vomiting        Home Medications Prior to Admission medications   Medication Sig Start Date End Date Taking? Authorizing Provider  albuterol (VENTOLIN HFA) 108 (90 Base) MCG/ACT inhaler Inhale 1-2 puffs into the lungs every 6 (six) hours as needed for wheezing or shortness of breath. 10/13/20   Byrum, Rose Fillers, MD  amiodarone (PACERONE)  200 MG tablet Take 1 tablet (200 mg total) by mouth daily. 08/29/21   Elouise Munroe, MD  apixaban (ELIQUIS) 5 MG TABS tablet Take 1 tablet (5 mg total) by mouth 2 (two) times daily. 09/25/21   [name], MD  benzonatate (TESSALON) 200 MG capsule Take 1 capsule (200 mg total) by mouth 3 (three) times daily as needed for cough. 09/28/21   Cobb, Karie Schwalbe, NP  calcium carbonate (OS-CAL - DOSED IN MG OF ELEMENTAL CALCIUM) 1250 (500 Ca) MG tablet Take 1 tablet by mouth every evening.    [provider]  Cholecalciferol (VITAMIN D) 50 MCG (2000 UT) tablet Take 2,000 Units by mouth every evening.    [provider]  empagliflozin (JARDIANCE) 10 MG TABS tablet Take 1 tablet (10 mg total) by mouth daily before breakfast. 09/27/21   Elouise Munroe, MD  fluticasone (FLONASE) 50 MCG/ACT nasal spray SPRAY 2 SPRAYS INTO EACH NOSTRIL EVERY DAY 09/28/21   Hunsucker, Bonna Gains, MD  folic acid (FOLVITE) 1 MG tablet Take 1 mg by mouth every evening.    [provider]  furosemide (LASIX) 20 MG tablet TAKE 1 TABLET (20 MG) BY MOUTH DAILY, TAKE 1 EXTRA TABLET BY MOUTH (20 MG) AS NEEDED FOR INCREASED SWELLING. 09/17/21   Elouise Munroe, MD  gabapentin (NEURONTIN) 300 MG capsule TAKE TWO CAPSULES BY MOUTH EVERY MORNING, TWO CAPSULES EACH AFTERNOON AND THREE CAPSULES AT BEDTIME 07/30/21   Hoyt Koch, MD  hydroxychloroquine (PLAQUENIL) 200 MG tablet Take 200 mg by mouth daily.  [provider]  methotrexate 2.5 MG tablet Take 10 mg by mouth every Thursday.    [provider]  metoprolol tartrate (LOPRESSOR) 25 MG tablet Take 1 tablet (25 mg total) by mouth 2 (two) times daily. 08/09/21 11/07/21  Elouise Munroe, MD  Misc Natural Products (ADVANCED JOINT RELIEF) CAPS Take 1 capsule by mouth daily. Joint Advantage Gold 5x    [provider]  Multiple Vitamin (MULTIVITAMIN WITH MINERALS) TABS tablet Take 1 tablet by mouth daily.    [provider]  RESTASIS 0.05 % ophthalmic emulsion Place 1 drop into both eyes 2 (two) times daily. 07/30/17   [provider]  triamcinolone cream (KENALOG) 0.1 % Apply 1 application. topically 2 (two) times daily. Patient taking differently: Apply 1 application  topically as needed. 09/18/21   Henson, Vickie L, NP-C  umeclidinium-vilanterol (ANORO ELLIPTA) 62.5-25 MCG/ACT AEPB INHALE 1 PUFF BY MOUTH EVERY DAY 07/18/21   Collene Gobble, MD  vitamin C (ASCORBIC ACID) 500 MG tablet Take 500 mg by mouth daily.    [provider]  vitamin E 180 MG (400 UNITS) capsule Take 400 Units by mouth daily.    [provider]  zolpidem (AMBIEN) 5 MG tablet TAKE 1 TABLET BY MOUTH AT BEDTIME AS NEEDED FOR SLEEP. 05/11/21   Hoyt Koch, MD      Allergies    Patient has no known allergies.    Review of Systems   Review of Systems  Constitutional:  Positive for appetite change, chills, diaphoresis, fatigue and fever.  HENT:  Positive for congestion and rhinorrhea.   Respiratory:  Positive for cough. Negative for shortness of breath.        Denies hemoptysis.   Cardiovascular:  Negative for chest pain.  Gastrointestinal:  Negative for abdominal pain, diarrhea, nausea and vomiting.  Musculoskeletal:        Denies leg pain.   Neurological:  Positive for headaches.    Physical Exam Updated Vital Signs BP 116/63 (BP Location: Right Arm)   Pulse 80   Temp 98.3 F (36.8 C) (Oral)   Resp (!) 22   Ht '5\' 2"'$  (1.575 m)   Wt 57.2 kg   SpO2 95%   BMI 23.05 kg/m  Physical Exam Constitutional:      Appearance: She is ill-appearing.  HENT:     Head:     Comments: No inflammation of bilateral nares.     Mouth/Throat:     Mouth: Mucous membranes are moist.  Cardiovascular:     Rate and Rhythm: Normal rate and regular rhythm.     Heart sounds: No murmur heard.    No friction rub. No gallop.     Comments: Normal pulses. Pulmonary:     Breath sounds: No wheezing.     Comments:  Tachypnea. Increased work of breathing. Coarse breath sounds in the right upper lobe.  Musculoskeletal:        General: No swelling.  Neurological:     Mental Status: She is alert.     ED Results / Procedures / Treatments   Labs (all labs ordered are listed, but only abnormal results are displayed) Labs Reviewed  RESP PANEL BY RT-PCR (FLU A&B, COVID) ARPGX2 - Abnormal; Notable for the following components:      Result Value   SARS Coronavirus 2 by RT PCR POSITIVE (*)    All other components within normal limits  CBC WITH DIFFERENTIAL/PLATELET - Abnormal; Notable for the following components:  WBC 3.3 (*)    RBC 3.58 (*)    Hemoglobin 11.1 (*)    HCT 33.1 (*)    Platelets 145 (*)    All other components within normal limits  COMPREHENSIVE METABOLIC PANEL - Abnormal; Notable for the following components:   Sodium 127 (*)    Chloride 97 (*)    CO2 19 (*)    Creatinine, Ser 1.24 (*)    Calcium 8.7 (*)    GFR, Estimated 47 (*)    All other components within normal limits    EKG None  Radiology DG Chest 2 View  Result Date: 10/24/2021 CLINICAL DATA:  Fever. EXAM: CHEST - 2 VIEW COMPARISON:  09/28/2021 FINDINGS: Ill-defined patchy opacity in the right upper lobe, new from prior radiograph. There are lesser ill-defined left perihilar and bilateral infrahilar opacities. Stable heart size with left-sided pacemaker in place. No pneumothorax or significant effusion. No acute osseous abnormalities are seen. IMPRESSION: Ill-defined patchy opacity in the right upper lobe, suspicious for pneumonia. There are lesser ill-defined left perihilar and bilateral infrahilar opacities, atelectasis versus additional pneumonia. Recommend radiographic follow-up after course of treatment to ensure resolution. Electronically Signed   By: Keith Rake M.D.   On: 10/24/2021 18:41    Procedures Procedures    Medications Ordered in ED Medications - No data to display  ED Course/ Medical Decision  Making/ A&P                           Medical Decision Making Sheila Morales is a 71 y.o. female with history of COPD (not on oxygen at home), CHF, interstitial lung disease, systemic lupus erythematous that presents with 4 days of fever, chills, cough, and nasal congestion.  Patient is ill-appearing. Differential diagnosis includes but is not limited to COVID-pneumonia, secondary bacterial pneumonia, viral upper respiratory tract infection. Will order respiratory panel to assess for potential infection.  Will order chest x-ray to assess for acute cardiopulmonary disease.  Will order CBC and CMP to assess patient's cell counts, electrolytes, renal function, liver function.  Patient discussed with Dr. Laverta Baltimore.  Amount and/or Complexity of Data Reviewed Labs: ordered. Radiology: ordered.  Risk Decision regarding hospitalization.   10:48 PM  Patient's respiratory panel is positive for SARS-Cov-2. Will order Covid order set. Chest x-ray demonstrates an ill-defined patchy opacity in the right upper lobe suspicious for pneumonia as well as ill-defined left perihilar and bilateral infrahilar opacities. Updated patient on results of respiratory panel and chest x-ray.  Discussed with patient plan to admit given concern for covid pneumonia and hypoxia. Discussed plan to begin treatment with molnupiravir. Patient agrees with the plan.   11:41 PM  I spoke with the hospitalist Dr. Marlowe Sax about admitting the patient. Dr. Marlowe Sax is comfortable admitting the patient but states that it may take some time given the limited number of open beds. She recommended that we begin steroids.          Final Clinical Impression(s) / ED Diagnoses Final diagnoses:  None    Rx / DC Orders ED Discharge Orders     None         Caidin Heidenreich, Claudia Desanctis, MD 10/24/21 2345    Margette Fast, MD 10/30/21 [id number]

## 2021-10-24 NOTE — Discharge Instructions (Signed)
Please go to the emergency department as soon as you leave urgent care for further evaluation and management. ?

## 2021-10-24 NOTE — ED Triage Notes (Signed)
Pt c/o fever, headache, dyspnea, cough,

## 2021-10-24 NOTE — ED Triage Notes (Signed)
Pt arrives to ED with c/o fever, headache, nasal congestion x4 days. Pt with hx COPD and CHF.

## 2021-10-24 NOTE — ED Notes (Signed)
Patient placed on [facility] per EDP, orders to cancel ambulating pulse ox.

## 2021-10-24 NOTE — ED Provider Notes (Signed)
[facility]    CSN: [id number] Arrival date & time: 10/24/21  1641      History   Chief Complaint Chief Complaint  Patient presents with   Shortness of Breath    HPI Sheila Morales is a 71 y.o. female.   Patient presents with cough, fever, headache, nasal congestion that started about 1 to 2 weeks ago.  Patient also had similar symptoms about 1 month ago and was treated by pulmonology with doxycycline and prednisone.  Her family member who is present in room has had similar symptoms as well.  She has taken leftover doxycycline for 2 doses as well as Flonase and Tylenol with minimal improvement.  Patient denies shortness of breath but does appear tachypneic in room.  She does have a history of COPD and CHF.  Patient does not wear oxygen at home.   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     Patient Active Problem List   Diagnosis Date Noted   Hyperkalemia 04/23/2021   Interstitial lung disease ([place]) 04/23/2021   E coli bacteremia 04/01/2021   AF (paroxysmal atrial fibrillation) ([place]) 03/28/2021   Chronic anticoagulation 03/28/2021   Pulmonary vascular congestion 03/21/2021   Chronic diastolic CHF (congestive heart failure) ([place]) 03/21/2021   Pacemaker 09/07/2020   Left foot pain 06/30/2020   Sinus node dysfunction ([place]) 05/26/2020   Bradycardia 05/11/2020   Pain in right lower leg 02/18/2020   Episodic lightheadedness 02/02/2020   Hypertension  02/02/2020   LLQ pain 11/23/2019   Cyst of buttocks 10/05/2019   Methotrexate, long term, current use 07/29/2019   Rib pain 11/09/2018   Community acquired pneumonia 02/05/2018   BPPV (benign paroxysmal positional vertigo) 12/16/2017   Acute respiratory failure with hypoxia and hypercapnia ([place]) 11/05/2017   Hyponatremia 11/01/2017   Insomnia 02/24/2017   COPD (chronic obstructive pulmonary disease) ([place]) 01/22/2017   Routine general medical examination at a health care facility 02/19/2016   Hereditary and idiopathic peripheral neuropathy 02/17/2015   Smokers' cough ([place]) 12/07/2014   Systemic lupus erythematosus ([place]) 10/13/2013   Cough 01/18/2009   Rash 05/31/2008   Irritable bowel syndrome 03/25/2008   History of rheumatic fever as a child 01/17/2007    Past Surgical History:  Procedure Laterality Date   CHOLECYSTECTOMY     CHOLECYSTECTOMY, LAPAROSCOPIC     COLONOSCOPY     ESOPHAGOGASTRODUODENOSCOPY     lesion, vulva excision     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   Procedure: PACEMAKER IMPLANT;  Surgeon: [name], MD;  Location: [facility];  Service: Cardiovascular;  Laterality: N/A;   TONSILLECTOMY     VIDEO BRONCHOSCOPY Bilateral 11/06/2017   Procedure: VIDEO BRONCHOSCOPY WITHOUT FLUORO;  Surgeon: Marshell Garfinkel, MD;  Location: [facility];  Service: Cardiopulmonary;  Laterality: Bilateral;    OB History   No obstetric history on file.      Home Medications  Prior to Admission medications   Medication Sig Start Date End Date Taking? Authorizing Provider  albuterol (VENTOLIN HFA) 108 (90 Base) MCG/ACT inhaler Inhale 1-2 puffs into the lungs every 6 (six) hours as needed for wheezing or shortness of breath. 10/13/20   Byrum, Rose Fillers, MD  amiodarone (PACERONE) 200 MG tablet Take 1 tablet (200 mg total) by mouth daily. 08/29/21   Elouise Munroe, MD  apixaban (ELIQUIS) 5 MG TABS tablet Take 1 tablet (5 mg total) by mouth 2 (two) times daily. 09/25/21    [name], MD  benzonatate (TESSALON) 200 MG capsule Take 1 capsule (200 mg total) by mouth 3 (three) times daily as needed for cough. 09/28/21   Cobb, Karie Schwalbe, NP  calcium carbonate (OS-CAL - DOSED IN MG OF ELEMENTAL CALCIUM) 1250 (500 Ca) MG tablet Take 1 tablet by mouth every evening.    [provider]  Cholecalciferol (VITAMIN D) 50 MCG (2000 UT) tablet Take 2,000 Units by mouth every evening.    [provider]  empagliflozin (JARDIANCE) 10 MG TABS tablet Take 1 tablet (10 mg total) by mouth daily before breakfast. 09/27/21   Elouise Munroe, MD  fluticasone (FLONASE) 50 MCG/ACT nasal spray SPRAY 2 SPRAYS INTO EACH NOSTRIL EVERY DAY 09/28/21   Hunsucker, Bonna Gains, MD  folic acid (FOLVITE) 1 MG tablet Take 1 mg by mouth every evening.    [provider]  furosemide (LASIX) 20 MG tablet TAKE 1 TABLET (20 MG) BY MOUTH DAILY, TAKE 1 EXTRA TABLET BY MOUTH (20 MG) AS NEEDED FOR INCREASED SWELLING. 09/17/21   Elouise Munroe, MD  gabapentin (NEURONTIN) 300 MG capsule TAKE TWO CAPSULES BY MOUTH EVERY MORNING, TWO CAPSULES EACH AFTERNOON AND THREE CAPSULES AT BEDTIME 07/30/21   Hoyt Koch, MD  hydroxychloroquine (PLAQUENIL) 200 MG tablet Take 200 mg by mouth daily.    [provider]  methotrexate 2.5 MG tablet Take 10 mg by mouth every Thursday.    [provider]  metoprolol tartrate (LOPRESSOR) 25 MG tablet Take 1 tablet (25 mg total) by mouth 2 (two) times daily. 08/09/21 11/07/21  Elouise Munroe, MD  Misc Natural Products (ADVANCED JOINT RELIEF) CAPS Take 1 capsule by mouth daily. Joint Advantage Gold 5x    [provider]  Multiple Vitamin (MULTIVITAMIN WITH MINERALS) TABS tablet Take 1 tablet by mouth daily.    [provider]  RESTASIS 0.05 % ophthalmic emulsion Place 1 drop into both eyes 2 (two) times daily. 07/30/17   [provider]  triamcinolone cream (KENALOG) 0.1 % Apply 1 application. topically 2  (two) times daily. Patient taking differently: Apply 1 application  topically as needed. 09/18/21   Henson, Vickie L, NP-C  umeclidinium-vilanterol (ANORO ELLIPTA) 62.5-25 MCG/ACT AEPB INHALE 1 PUFF BY MOUTH EVERY DAY 07/18/21   Collene Gobble, MD  vitamin C (ASCORBIC ACID) 500 MG tablet Take 500 mg by mouth daily.    [provider]  vitamin E 180 MG (400 UNITS) capsule Take 400 Units by mouth daily.    [provider]  zolpidem (AMBIEN) 5 MG tablet TAKE 1 TABLET BY MOUTH AT BEDTIME AS NEEDED FOR SLEEP. 05/11/21   Hoyt Koch, MD    Family History Family History  Problem Relation Age of Onset   Stroke Mother        Deceased, 24s   Atrial fibrillation Father    Colon cancer Father    Dementia Sister    Other Sister  Pick's disease, deceased   Healthy Daughter     Social History Social History   Tobacco Use   Smoking status: Former    Packs/day: 0.25    Years: 30.00    Total pack years: 7.50    Types: Cigarettes    Quit date: 01/21/2020    Years since quitting: 1.7   Smokeless tobacco: Never   Tobacco comments:    smokes "every now and then" 08/24/20 [facility]   Vaping Use   Vaping Use: Never used  Substance Use Topics   Alcohol use: Yes    Alcohol/week: 5.0 standard drinks of alcohol    Types: 5 Standard drinks or equivalent per week    Comment: 4 out of 7 dyas per husband at bedside   Drug use: No     Allergies   Patient has no known allergies.   Review of Systems Review of Systems Per HPI  Physical Exam Triage Vital Signs ED Triage Vitals  Enc Vitals Group     BP 10/24/21 1659 (!) 97/56     Pulse Rate 10/24/21 1659 75     Resp 10/24/21 1659 (!) 22     Temp 10/24/21 1659 (!) 100.7 F (38.2 C)     Temp Source 10/24/21 1659 Oral     SpO2 10/24/21 1659 (!) 88 %     Weight --      Height --      Head Circumference --      Peak Flow --      Pain Score 10/24/21 1700 0     Pain Loc --      Pain Edu? --      Excl. in [place]? --     No data found.  Updated Vital Signs BP (!) 97/56 (BP Location: Left Arm)   Pulse 75   Temp (!) 100.7 F (38.2 C) (Oral)   Resp (!) 22   SpO2 (!) 88%   Visual Acuity Right Eye Distance:   Left Eye Distance:   Bilateral Distance:    Right Eye Near:   Left Eye Near:    Bilateral Near:     Physical Exam Constitutional:      General: She is not in acute distress.    Appearance: Normal appearance. She is ill-appearing and toxic-appearing. She is not diaphoretic.  HENT:     Head: Normocephalic and atraumatic.  Eyes:     Extraocular Movements: Extraocular movements intact.     Conjunctiva/sclera: Conjunctivae normal.     Pupils: Pupils are equal, round, and reactive to light.  Cardiovascular:     Rate and Rhythm: Normal rate and regular rhythm.     Pulses: Normal pulses.     Heart sounds: Normal heart sounds.  Pulmonary:     Effort: No respiratory distress.     Breath sounds: No stridor. Rhonchi present. No wheezing or rales.     Comments: Mild tachypnea noted on exam.  Abdominal:     General: Abdomen is flat. Bowel sounds are normal.     Palpations: Abdomen is soft.  Musculoskeletal:        General: Normal range of motion.     Cervical back: Normal range of motion.  Skin:    General: Skin is warm and dry.  Neurological:     General: No focal deficit present.     Mental Status: She is alert and oriented to person, place, and time. Mental status is at baseline.  Psychiatric:  Mood and Affect: Mood normal.        Behavior: Behavior normal.      UC Treatments / Results  Labs (all labs ordered are listed, but only abnormal results are displayed) Labs Reviewed - No data to display  EKG   Radiology No results found.  Procedures Procedures (including critical care time)  Medications Ordered in UC Medications - No data to display  Initial Impression / Assessment and Plan / UC Course  I have reviewed the triage vital signs and the nursing  notes.  Pertinent labs & imaging results that were available during my care of the patient were reviewed by me and considered in my medical decision making (see chart for details).     Patient is very ill-appearing on exam, has mild tachypnea, rhonchi on exam.  Oxygen saturation is also ranging from 87 to 91%.  Blood pressure is 32Z systolic which is abnormal for her.  Patient has history of COPD and CHF.  Due to all of these factors and vital signs, patient was advised to go to the ER for further evaluation and management as she needs a more extensive evaluation than can be provided here at urgent care.  Suggested EMS transport but patient and family member declined.  They are agreeable to going to the hospital via self transport.  Risks associated with going via self transport were discussed with patient and family member.  They both voiced understanding.  Patient's family member stated that he was going to go to [facility] but I strongly advised against this given the suspicion that patient may need to be admitted for observation.  Suggested [facility] or [facility].  Family member still stated that he wished to go to [facility].  Patient left via her family member transporting her to the hospital. Final Clinical Impressions(s) / UC Diagnoses   Final diagnoses:  Low oxygen saturation  Fever, unspecified  Acute cough     Discharge Instructions      Please go to the emergency department as soon as you leave urgent care for further evaluation and management.    ED Prescriptions   None    PDMP not reviewed this encounter.   Teodora Medici, [place] 10/24/21 1714

## 2021-10-25 ENCOUNTER — Encounter: Payer: Self-pay | Admitting: Internal Medicine

## 2021-10-25 ENCOUNTER — Encounter: Payer: Self-pay | Admitting: Pulmonary Disease

## 2021-10-25 DIAGNOSIS — Z800 Family history of malignant neoplasm of digestive organs: Secondary | ICD-10-CM | POA: Diagnosis not present

## 2021-10-25 DIAGNOSIS — D72819 Decreased white blood cell count, unspecified: Secondary | ICD-10-CM | POA: Diagnosis present

## 2021-10-25 DIAGNOSIS — G629 Polyneuropathy, unspecified: Secondary | ICD-10-CM | POA: Diagnosis present

## 2021-10-25 DIAGNOSIS — J1282 Pneumonia due to coronavirus disease 2019: Secondary | ICD-10-CM | POA: Diagnosis present

## 2021-10-25 DIAGNOSIS — Z950 Presence of cardiac pacemaker: Secondary | ICD-10-CM | POA: Diagnosis not present

## 2021-10-25 DIAGNOSIS — I480 Paroxysmal atrial fibrillation: Secondary | ICD-10-CM | POA: Diagnosis present

## 2021-10-25 DIAGNOSIS — Z7901 Long term (current) use of anticoagulants: Secondary | ICD-10-CM | POA: Diagnosis not present

## 2021-10-25 DIAGNOSIS — Z79899 Other long term (current) drug therapy: Secondary | ICD-10-CM | POA: Diagnosis not present

## 2021-10-25 DIAGNOSIS — G2581 Restless legs syndrome: Secondary | ICD-10-CM | POA: Diagnosis present

## 2021-10-25 DIAGNOSIS — I5032 Chronic diastolic (congestive) heart failure: Secondary | ICD-10-CM | POA: Diagnosis present

## 2021-10-25 DIAGNOSIS — J440 Chronic obstructive pulmonary disease with acute lower respiratory infection: Secondary | ICD-10-CM | POA: Diagnosis present

## 2021-10-25 DIAGNOSIS — Z7951 Long term (current) use of inhaled steroids: Secondary | ICD-10-CM | POA: Diagnosis not present

## 2021-10-25 DIAGNOSIS — Z87891 Personal history of nicotine dependence: Secondary | ICD-10-CM | POA: Diagnosis not present

## 2021-10-25 DIAGNOSIS — I110 Hypertensive heart disease with heart failure: Secondary | ICD-10-CM | POA: Diagnosis present

## 2021-10-25 DIAGNOSIS — J9601 Acute respiratory failure with hypoxia: Secondary | ICD-10-CM | POA: Diagnosis present

## 2021-10-25 DIAGNOSIS — G4700 Insomnia, unspecified: Secondary | ICD-10-CM | POA: Diagnosis present

## 2021-10-25 DIAGNOSIS — U071 COVID-19: Principal | ICD-10-CM

## 2021-10-25 DIAGNOSIS — M329 Systemic lupus erythematosus, unspecified: Secondary | ICD-10-CM | POA: Diagnosis present

## 2021-10-25 DIAGNOSIS — E222 Syndrome of inappropriate secretion of antidiuretic hormone: Secondary | ICD-10-CM | POA: Diagnosis present

## 2021-10-25 DIAGNOSIS — Z823 Family history of stroke: Secondary | ICD-10-CM | POA: Diagnosis not present

## 2021-10-25 DIAGNOSIS — Z28310 Unvaccinated for covid-19: Secondary | ICD-10-CM | POA: Diagnosis not present

## 2021-10-25 DIAGNOSIS — R112 Nausea with vomiting, unspecified: Secondary | ICD-10-CM | POA: Diagnosis present

## 2021-10-25 DIAGNOSIS — I495 Sick sinus syndrome: Secondary | ICD-10-CM | POA: Diagnosis present

## 2021-10-25 DIAGNOSIS — J189 Pneumonia, unspecified organism: Secondary | ICD-10-CM | POA: Diagnosis not present

## 2021-10-25 DIAGNOSIS — Z86010 Personal history of colonic polyps: Secondary | ICD-10-CM | POA: Diagnosis not present

## 2021-10-25 LAB — TRIGLYCERIDES: Triglycerides: 167 mg/dL — ABNORMAL HIGH (ref <150)

## 2021-10-25 LAB — FIBRINOGEN: Fibrinogen: 479 mg/dL — ABNORMAL HIGH (ref 210–475)

## 2021-10-25 LAB — PROCALCITONIN: Procalcitonin: <0.1 ng/mL

## 2021-10-25 LAB — LACTIC ACID, PLASMA: Lactic Acid, Venous: 1.8 mmol/L (ref 0.5–1.9)

## 2021-10-25 LAB — D-DIMER, QUANTITATIVE: D-Dimer, Quant: 0.5 ug/mL-FEU (ref 0.00–0.50)

## 2021-10-25 LAB — C-REACTIVE PROTEIN: CRP: 3.6 mg/dL — ABNORMAL HIGH (ref <1.0)

## 2021-10-25 LAB — LACTATE DEHYDROGENASE: LDH: 213 U/L — ABNORMAL HIGH (ref 98–192)

## 2021-10-25 LAB — FERRITIN: Ferritin: 579 ng/mL — ABNORMAL HIGH (ref 11–307)

## 2021-10-25 MED ORDER — EMPAGLIFLOZIN 10 MG PO TABS
10.0000 mg | ORAL_TABLET | Freq: Every day | ORAL | Status: DC
  Administered 2021-10-26 – 2021-10-28 (×3): 10 mg via ORAL
  Filled 2021-10-25 (×3): qty 1

## 2021-10-25 MED ORDER — SODIUM CHLORIDE 0.9% FLUSH: 3.0000 mL | INTRAVENOUS | Status: DC | PRN

## 2021-10-25 MED ORDER — ZOLPIDEM TARTRATE 5 MG PO TABS
5.0000 mg | ORAL_TABLET | Freq: Every evening | ORAL | Status: DC | PRN
  Administered 2021-10-26 – 2021-10-27 (×3): 5 mg via ORAL
  Filled 2021-10-25 (×3): qty 1

## 2021-10-25 MED ORDER — ACETAMINOPHEN 500 MG PO TABS
1000.0000 mg | ORAL_TABLET | Freq: Once | ORAL | Status: AC
  Administered 2021-10-25: 1000 mg via ORAL
  Filled 2021-10-25: qty 2

## 2021-10-25 MED ORDER — ACETAMINOPHEN 325 MG PO TABS
650.0000 mg | ORAL_TABLET | Freq: Four times a day (QID) | ORAL | Status: DC | PRN
  Administered 2021-10-25 – 2021-10-27 (×4): 650 mg via ORAL
  Filled 2021-10-25 (×4): qty 2

## 2021-10-25 MED ORDER — ASCORBIC ACID 500 MG PO TABS
500.0000 mg | ORAL_TABLET | Freq: Every day | ORAL | Status: DC
  Administered 2021-10-25 – 2021-10-28 (×4): 500 mg via ORAL
  Filled 2021-10-25 (×4): qty 1

## 2021-10-25 MED ORDER — HYDROCOD POLI-CHLORPHE POLI ER 10-8 MG/5ML PO SUER: 5.0000 mL | Freq: Two times a day (BID) | ORAL | Status: DC | PRN

## 2021-10-25 MED ORDER — ALBUTEROL SULFATE HFA 108 (90 BASE) MCG/ACT IN AERS
1.0000 | INHALATION_SPRAY | Freq: Four times a day (QID) | RESPIRATORY_TRACT | Status: DC | PRN
  Filled 2021-10-25: qty 6.7

## 2021-10-25 MED ORDER — SODIUM CHLORIDE 0.9% FLUSH
3.0000 mL | Freq: Two times a day (BID) | INTRAVENOUS | Status: DC
  Administered 2021-10-25 – 2021-10-28 (×6): 3 mL via INTRAVENOUS

## 2021-10-25 MED ORDER — FOLIC ACID 1 MG PO TABS
1.0000 mg | ORAL_TABLET | Freq: Every evening | ORAL | Status: DC
  Administered 2021-10-25 – 2021-10-27 (×3): 1 mg via ORAL
  Filled 2021-10-25 (×3): qty 1

## 2021-10-25 MED ORDER — VITAMIN E 180 MG (400 UNIT) PO CAPS
400.0000 [IU] | ORAL_CAPSULE | Freq: Every day | ORAL | Status: DC
Start: 2021-10-26 — End: 2021-10-28
  Administered 2021-10-26 – 2021-10-28 (×3): 400 [IU] via ORAL
  Filled 2021-10-25 (×3): qty 1

## 2021-10-25 MED ORDER — BENZONATATE 100 MG PO CAPS
200.0000 mg | ORAL_CAPSULE | Freq: Three times a day (TID) | ORAL | Status: DC | PRN
Start: 2021-10-25 — End: 2021-10-28

## 2021-10-25 MED ORDER — SODIUM CHLORIDE 0.9 % IV SOLN: 250.0000 mL | INTRAVENOUS | Status: DC | PRN

## 2021-10-25 MED ORDER — ONDANSETRON HCL 4 MG PO TABS: 4.0000 mg | ORAL_TABLET | Freq: Four times a day (QID) | ORAL | Status: DC | PRN

## 2021-10-25 MED ORDER — GABAPENTIN 100 MG PO CAPS
200.0000 mg | ORAL_CAPSULE | Freq: Three times a day (TID) | ORAL | Status: DC
  Administered 2021-10-25 – 2021-10-28 (×8): 200 mg via ORAL
  Filled 2021-10-25 (×8): qty 2

## 2021-10-25 MED ORDER — CYCLOSPORINE 0.05 % OP EMUL
1.0000 [drp] | Freq: Two times a day (BID) | OPHTHALMIC | Status: DC
  Administered 2021-10-25 – 2021-10-28 (×6): 1 [drp] via OPHTHALMIC
  Filled 2021-10-25 (×6): qty 30

## 2021-10-25 MED ORDER — ONDANSETRON HCL 4 MG/2ML IJ SOLN
4.0000 mg | Freq: Four times a day (QID) | INTRAMUSCULAR | Status: DC | PRN
  Administered 2021-10-25: 4 mg via INTRAVENOUS
  Filled 2021-10-25: qty 2

## 2021-10-25 MED ORDER — AMIODARONE HCL 200 MG PO TABS
200.0000 mg | ORAL_TABLET | Freq: Every day | ORAL | Status: DC
  Administered 2021-10-25 – 2021-10-28 (×4): 200 mg via ORAL
  Filled 2021-10-25 (×4): qty 1

## 2021-10-25 MED ORDER — HYDROXYCHLOROQUINE SULFATE 200 MG PO TABS
200.0000 mg | ORAL_TABLET | Freq: Every day | ORAL | Status: DC
  Administered 2021-10-25 – 2021-10-28 (×4): 200 mg via ORAL
  Filled 2021-10-25 (×4): qty 1

## 2021-10-25 MED ORDER — FUROSEMIDE 20 MG PO TABS
20.0000 mg | ORAL_TABLET | Freq: Every day | ORAL | Status: DC
  Administered 2021-10-25 – 2021-10-28 (×4): 20 mg via ORAL
  Filled 2021-10-25 (×4): qty 1

## 2021-10-25 MED ORDER — METHYLPREDNISOLONE SODIUM SUCC 125 MG IJ SOLR
60.0000 mg | Freq: Two times a day (BID) | INTRAMUSCULAR | Status: AC
  Administered 2021-10-25 – 2021-10-28 (×6): 60 mg via INTRAVENOUS
  Filled 2021-10-25 (×6): qty 2

## 2021-10-25 MED ORDER — GUAIFENESIN-DM 100-10 MG/5ML PO SYRP
10.0000 mL | ORAL_SOLUTION | ORAL | Status: DC | PRN
Start: 2021-10-25 — End: 2021-10-28

## 2021-10-25 MED ORDER — ORAL CARE MOUTH RINSE: 15.0000 mL | OROMUCOSAL | Status: DC | PRN

## 2021-10-25 MED ORDER — PREDNISONE 50 MG PO TABS
50.0000 mg | ORAL_TABLET | Freq: Every day | ORAL | Status: DC
Start: 2021-10-28 — End: 2021-10-28

## 2021-10-25 MED ORDER — APIXABAN 5 MG PO TABS
5.0000 mg | ORAL_TABLET | Freq: Two times a day (BID) | ORAL | Status: DC
  Administered 2021-10-25 – 2021-10-28 (×7): 5 mg via ORAL
  Filled 2021-10-25: qty 1
  Filled 2021-10-25: qty 2
  Filled 2021-10-25 (×5): qty 1

## 2021-10-25 MED ORDER — METOPROLOL TARTRATE 25 MG PO TABS
25.0000 mg | ORAL_TABLET | Freq: Two times a day (BID) | ORAL | Status: DC
  Administered 2021-10-25 – 2021-10-28 (×6): 25 mg via ORAL
  Filled 2021-10-25 (×6): qty 1

## 2021-10-25 MED ORDER — UMECLIDINIUM-VILANTEROL 62.5-25 MCG/ACT IN AEPB
1.0000 | INHALATION_SPRAY | Freq: Every day | RESPIRATORY_TRACT | Status: DC
  Administered 2021-10-26 – 2021-10-28 (×3): 1 via RESPIRATORY_TRACT
  Filled 2021-10-25: qty 14

## 2021-10-25 NOTE — Telephone Encounter (Signed)
To Dr. Silas Flood as Juluis Rainier.

## 2021-10-25 NOTE — H&P (Signed)
History and Physical    Sheila Morales FWY:[id number] DOB: 12-16-1950 DOA: 10/24/2021  I have briefly reviewed the patient's prior medical records in [facility]  PCP: Hoyt Koch, MD  Patient coming from: home  Chief Complaint: fevers, chills, shortness of breath   HPI: [name] is a 71 y.o. 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  She is actively vomiting during my interview and does not wear her hearing aids so history is somewhat limited but husband contributes.  She has been feeling poorly for about 4 days.  Over the last day she also has experienced nausea and vomiting.  She has been experiencing fatigue, rhinorrhea, decreased appetite, headache and shortness of breath.  She had similar episodes about a month ago, and underwent treatment with doxycycline and prednisone taper at that time.  Given persistent symptoms and high fever husband brought her to urgent care and directed her to the ER.  They subsequently presented to [facility]  ED Course: In the ED she is afebrile 97.5, normotensive, hypoxic on room air requiring 2 L nasal cannula.  Chest x-ray showed right upper lobe opacity, suspicious for pneumonia with ill-defined left perihilar and bilateral infrahilar opacities.  She was found to be COVID-positive, started on molnupiravir and admitted to the hospital.  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     Past Surgical History:  Procedure Laterality Date   CHOLECYSTECTOMY     CHOLECYSTECTOMY, LAPAROSCOPIC     COLONOSCOPY     ESOPHAGOGASTRODUODENOSCOPY     lesion, vulva excision     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   Procedure: PACEMAKER IMPLANT;  Surgeon: [name], MD;  Location: [facility];  Service: Cardiovascular;  Laterality: N/A;   TONSILLECTOMY     VIDEO BRONCHOSCOPY Bilateral 11/06/2017   Procedure: VIDEO BRONCHOSCOPY WITHOUT FLUORO;  Surgeon: Marshell Garfinkel, MD;  Location: [facility];  Service: Cardiopulmonary;  Laterality: Bilateral;     reports that she quit smoking about 21 months ago. Her smoking use included cigarettes. She has a 7.50 pack-year smoking history. She has never used smokeless tobacco. She reports current alcohol use of about 5.0 standard drinks of alcohol per week. She reports that she does not use drugs.  No Known Allergies  Family History  Problem Relation Age of Onset   Stroke Mother        Deceased, 39s   Atrial fibrillation Father    Colon cancer Father    Dementia Sister    Other Sister        Pick's disease, deceased   Healthy Daughter     Prior to Admission medications  Medication Sig Start Date End Date Taking? Authorizing Provider  albuterol (VENTOLIN HFA) 108 (90 Base) MCG/ACT inhaler Inhale 1-2 puffs into the lungs every 6 (six) hours as needed for wheezing or shortness of breath. 10/13/20  Yes Byrum, Rose Fillers, MD  amiodarone (PACERONE) 200 MG tablet Take 1 tablet (200 mg total) by mouth daily. 08/29/21  Yes Elouise Munroe, MD  apixaban (ELIQUIS) 5 MG TABS tablet Take 1 tablet (5 mg total) by mouth 2 (two) times daily. 09/25/21  Yes [name], MD  calcium carbonate  (OS-CAL - DOSED IN MG OF ELEMENTAL CALCIUM) 1250 (500 Ca) MG tablet Take 1 tablet by mouth every evening.   Yes [provider]  Cholecalciferol (VITAMIN D) 50 MCG (2000 UT) tablet Take 2,000 Units by mouth every evening.   Yes [provider]  empagliflozin (JARDIANCE) 10 MG TABS tablet Take 1 tablet (10 mg total) by mouth daily before breakfast. 09/27/21  Yes Elouise Munroe, MD  fluticasone (FLONASE) 50 MCG/ACT nasal spray SPRAY 2 SPRAYS INTO EACH NOSTRIL EVERY DAY 09/28/21  Yes Hunsucker, Bonna Gains, MD  folic acid (FOLVITE) 1 MG tablet Take 1 mg by mouth every evening.   Yes [provider]  furosemide (LASIX) 20 MG tablet TAKE 1 TABLET (20 MG) BY MOUTH DAILY, TAKE 1 EXTRA TABLET BY MOUTH (20 MG) AS NEEDED FOR INCREASED SWELLING. 09/17/21  Yes Elouise Munroe, MD  gabapentin (NEURONTIN) 300 MG capsule TAKE TWO CAPSULES BY MOUTH EVERY MORNING, TWO CAPSULES EACH AFTERNOON AND THREE CAPSULES AT BEDTIME 07/30/21  Yes Hoyt Koch, MD  hydroxychloroquine (PLAQUENIL) 200 MG tablet Take 200 mg by mouth daily.   Yes [provider]  methotrexate 2.5 MG tablet Take 10 mg by mouth every Thursday.   Yes [provider]  metoprolol tartrate (LOPRESSOR) 25 MG tablet Take 1 tablet (25 mg total) by mouth 2 (two) times daily. 08/09/21 11/07/21 Yes Elouise Munroe, MD  Misc Natural Products (ADVANCED JOINT RELIEF) CAPS Take 1 capsule by mouth daily. Joint Advantage Gold 5x   Yes [provider]  Multiple Vitamin (MULTIVITAMIN WITH MINERALS) TABS tablet Take 1 tablet by mouth daily.   Yes [provider]  RESTASIS 0.05 % ophthalmic emulsion Place 1 drop into both eyes 2 (two) times daily. 07/30/17  Yes [provider]  umeclidinium-vilanterol (ANORO ELLIPTA) 62.5-25 MCG/ACT AEPB INHALE 1 PUFF BY MOUTH EVERY DAY 07/18/21  Yes Byrum, Rose Fillers, MD  vitamin C (ASCORBIC ACID) 500 MG tablet Take 500 mg by mouth daily.   Yes [provider]  vitamin E 180 MG (400 UNITS) capsule Take 400 Units by mouth daily.   Yes [provider]  benzonatate (TESSALON) 200 MG capsule Take 1 capsule (200 mg total) by mouth 3 (three) times daily as needed for cough. 09/28/21   Cobb, Karie Schwalbe, NP  triamcinolone cream (KENALOG) 0.1 % Apply 1 application. topically 2 (two) times daily. Patient taking differently: Apply 1 application  topically as needed. 09/18/21   Henson, Vickie L, NP-C  zolpidem (AMBIEN) 5 MG tablet TAKE 1 TABLET BY MOUTH AT BEDTIME AS NEEDED FOR SLEEP. 05/11/21   Hoyt Koch, MD    Physical Exam: Vitals:   10/25/21 1100 10/25/21 1200 10/25/21 1300 10/25/21 1500  BP: (!) 141/80 (!) 141/89 (!) 144/82 118/90  Pulse: 73 67 81 60  Resp: '15 16 16 15  '$ Temp:  (!) 97.5 F (36.4 C)    TempSrc:  Oral  SpO2: 99% 100% 100% 100%  Weight:      Height:       Constitutional: Uncomfortable, vomiting Eyes: PERRL, lids and conjunctivae normal ENMT: Mucous membranes are dry Neck: normal, supple Respiratory: Diminished at the bases, faint rhonchi.  No wheezing Cardiovascular: Regular rate and rhythm, no murmurs / rubs / gallops.  No edema Abdomen: no tenderness, no masses palpated. Bowel sounds positive.  Musculoskeletal: no clubbing / cyanosis. Normal muscle tone.  Skin: no rashes, lesions, ulcers. No induration Neurologic: No focal  Labs on Admission: I have personally reviewed following labs and imaging studies  CBC: Recent Labs  Lab 10/24/21 1800  WBC 3.3*  NEUTROABS 2.2  HGB 11.1*  HCT 33.1*  MCV 92.5  PLT 086*   Basic Metabolic Panel: Recent Labs  Lab 10/24/21 1800  NA 127*  K 4.1  CL 97*  CO2 19*  GLUCOSE 96  BUN 14  CREATININE 1.24*  CALCIUM 8.7*   Liver Function Tests: Recent Labs  Lab 10/24/21 1800  AST 34  ALT 33  ALKPHOS 71  BILITOT 0.4  PROT 6.8  ALBUMIN 3.7   Coagulation Profile: No results for input(s): "INR", "PROTIME" in the last 168 hours. BNP (last 3  results) Recent Labs    03/21/21 1206 07/06/21 1425 07/09/21 1242  PROBNP 1,279.0* 1,971.0* 1,033.0*   CBG: No results for input(s): "GLUCAP" in the last 168 hours. Thyroid Function Tests: No results for input(s): "TSH", "T4TOTAL", "FREET4", "T3FREE", "THYROIDAB" in the last 72 hours. Urine analysis:    Component Value Date/Time   COLORURINE YELLOW 04/23/2021 1600   APPEARANCEUR CLEAR 04/23/2021 1600   LABSPEC 1.009 04/23/2021 1600   PHURINE 7.0 04/23/2021 1600   GLUCOSEU NEGATIVE 04/23/2021 1600   HGBUR NEGATIVE 04/23/2021 1600   BILIRUBINUR NEGATIVE 04/23/2021 1600   KETONESUR NEGATIVE 04/23/2021 1600   PROTEINUR NEGATIVE 04/23/2021 1600   NITRITE NEGATIVE 04/23/2021 1600   LEUKOCYTESUR NEGATIVE 04/23/2021 1600     Radiological Exams on Admission: DG Chest 2 View  Result Date: 10/24/2021 CLINICAL DATA:  Fever. EXAM: CHEST - 2 VIEW COMPARISON:  09/28/2021 FINDINGS: Ill-defined patchy opacity in the right upper lobe, new from prior radiograph. There are lesser ill-defined left perihilar and bilateral infrahilar opacities. Stable heart size with left-sided pacemaker in place. No pneumothorax or significant effusion. No acute osseous abnormalities are seen. IMPRESSION: Ill-defined patchy opacity in the right upper lobe, suspicious for pneumonia. There are lesser ill-defined left perihilar and bilateral infrahilar opacities, atelectasis versus additional pneumonia. Recommend radiographic follow-up after course of treatment to ensure resolution. Electronically Signed   By: Keith Rake M.D.   On: 10/24/2021 18:41     Assessment/Plan  Principal Problem:   COVID-19 virus infection Active Problems:   Interstitial lung disease ([place])   Systemic lupus erythematosus (HCC)   COPD (chronic obstructive pulmonary disease) (HCC)   Insomnia   Hyponatremia   Acute hypoxemic respiratory failure ([place])   Community acquired pneumonia   Hypertension   Sinus node dysfunction (HCC)    Pacemaker   Chronic diastolic CHF (congestive heart failure) ([place])  Principal problem Acute hypoxic respiratory failure due to pneumonia caused by COVID-19-patient is unvaccinated.  She is at risk for severe disease given underlying lung issues.  She was started on Molnupiravir, continue.  Husband does not wish for patient to receive Remdesivir.  Start steroids given hypoxia.  Continue supportive care with inhalers, supplemental oxygen, closely monitor.  Monitor inflammatory markers.  Active problems COPD, ILD, SLE -continue home medications, hold  methotrexate for now in the acute setting.  History of PAF, sick sinus syndrome, pacemaker-monitor on telemetry, continue Eliquis, continue metoprolol and amiodarone  Chronic diastolic CHF -no evidence of fluid overload currently, continue to closely monitor, continue home furosemide  Hyponatremia, chronic-in the past this was believed to be due to SIADH.  Sodium varying, most recent 1 done as an outpatient 2 months ago was 130.  Continue to monitor.  Nausea, vomiting-likely due to COVID-19 infection.  Advance diet as tolerated, provide antiemetics  Peripheral neuropathy-continue gabapentin   Scheduled Meds:  amiodarone  200 mg Oral Daily   apixaban  5 mg Oral BID   vitamin C  500 mg Oral Daily   cycloSPORINE  1 drop Both Eyes BID   [START ON 10/26/2021] empagliflozin  10 mg Oral QAC breakfast   folic acid  1 mg Oral QPM   furosemide  20 mg Oral Daily   gabapentin  200 mg Oral TID   hydroxychloroquine  200 mg Oral Daily   methylPREDNISolone (SOLU-MEDROL) injection  60 mg Intravenous Q12H   Followed by   Derrill Memo ON 10/28/2021] predniSONE  50 mg Oral Daily   metoprolol tartrate  25 mg Oral BID   molnupiravir EUA  4 capsule Oral BID   sodium chloride flush  3 mL Intravenous Q12H   umeclidinium-vilanterol  1 puff Inhalation Daily   vitamin E  400 Units Oral Daily   Continuous Infusions:  sodium chloride     PRN Meds:.sodium chloride,  acetaminophen, albuterol, benzonatate, chlorpheniramine-HYDROcodone, guaiFENesin-dextromethorphan, ondansetron **OR** ondansetron (ZOFRAN) IV, sodium chloride flush, zolpidem  DVT prophylaxis: Eliquis  Code Status: FUll code  Family Communication: husband at bedside  Disposition Plan: home when ready  Bed Type: telemetry  Consults called: none  Obs/Inp: inpatient  At the time of admission, it appears that the appropriate admission status for this patient is INPATIENT as it is expected that patient will require hospital care > 2 midnights. This is judged to be reasonable and necessary in order to provide the required intensity of service to ensure the patient's safety given: presenting symptoms, initial radiographic and laboratory data and in the context of their chronic comorbidities. Together, these circumstances are felt to place patient at high at high risk for further clinical deterioration threatening life, limb, or organ.  Marzetta Board, MD, PhD [facility]  Contact via [email or web address]  10/25/2021, 5:11 PM

## 2021-10-25 NOTE — Progress Notes (Signed)
MD at bedside during this time to complete admission.

## 2021-10-25 NOTE — ED Notes (Signed)
Care Handoff/Report given RN at [facility]. All Questions answered.

## 2021-10-25 NOTE — ED Notes (Signed)
[facility] at the Bedside. 

## 2021-10-25 NOTE — ED Provider Notes (Signed)
  Physical Exam  BP (!) 171/81   Pulse 73   Temp 100.2 F (37.9 C) (Oral)   Resp 20   Ht 1.575 m ('5\' 2"'$ )   Wt 57.2 kg   SpO2 96%   BMI 23.05 kg/m   Physical Exam  Procedures  Procedures  ED Course / MDM    Medical Decision Making Amount and/or Complexity of Data Reviewed Labs: ordered. Radiology: ordered.  Risk Decision regarding hospitalization.   71 year old female history of COPD CHF, interstitial lung disease presented last night with cough and fever and found to be COVID-positive.  Oxygen saturations were found to be in the high 80s.  Chest x-Chrishawna Farina with ill-defined patchy opacity in the right upper lobe. Molnupiravir tablet careers over here from [employer] Patient admitted to hospitalist and steroids initiated. This morning patient's temp is 100.2 and oxygen saturations are 96% on nasal cannula Heart rate normal Blood pressure elevated at 171/81 Awaiting transport for admission       Pattricia Boss, MD 10/25/21 [phone]

## 2021-10-25 NOTE — ED Notes (Signed)
Care Handoff/Report given to [facility] at this Time. All Questions answered. 

## 2021-10-26 DIAGNOSIS — J189 Pneumonia, unspecified organism: Secondary | ICD-10-CM | POA: Diagnosis not present

## 2021-10-26 DIAGNOSIS — U071 COVID-19: Secondary | ICD-10-CM | POA: Diagnosis not present

## 2021-10-26 DIAGNOSIS — J9601 Acute respiratory failure with hypoxia: Secondary | ICD-10-CM

## 2021-10-26 DIAGNOSIS — I5032 Chronic diastolic (congestive) heart failure: Secondary | ICD-10-CM

## 2021-10-26 LAB — CBC WITH DIFFERENTIAL/PLATELET
Abs Immature Granulocytes: 0 10*3/uL (ref 0.00–0.07)
Basophils Absolute: 0 10*3/uL (ref 0.0–0.1)
Basophils Relative: 0 %
Eosinophils Absolute: 0 10*3/uL (ref 0.0–0.5)
Eosinophils Relative: 0 %
HCT: 42.4 % (ref 36.0–46.0)
Hemoglobin: 13.7 g/dL (ref 12.0–15.0)
Lymphocytes Relative: 15 %
Lymphs Abs: 0.5 10*3/uL — ABNORMAL LOW (ref 0.7–4.0)
MCH: 30.9 pg (ref 26.0–34.0)
MCHC: 32.3 g/dL (ref 30.0–36.0)
MCV: 95.7 fL (ref 80.0–100.0)
Monocytes Absolute: 0.2 10*3/uL (ref 0.1–1.0)
Monocytes Relative: 5 %
Neutro Abs: 2.5 10*3/uL (ref 1.7–7.7)
Neutrophils Relative %: 80 %
Platelets: 161 10*3/uL (ref 150–400)
RBC: 4.43 MIL/uL (ref 3.87–5.11)
RDW: 14.4 % (ref 11.5–15.5)
WBC: 3.1 10*3/uL — ABNORMAL LOW (ref 4.0–10.5)
nRBC: 0 % (ref 0.0–0.2)

## 2021-10-26 LAB — COMPREHENSIVE METABOLIC PANEL
ALT: 41 U/L (ref 0–44)
AST: 51 U/L — ABNORMAL HIGH (ref 15–41)
Albumin: 3.5 g/dL (ref 3.5–5.0)
Alkaline Phosphatase: 79 U/L (ref 38–126)
Anion gap: 11 (ref 5–15)
BUN: 16 mg/dL (ref 8–23)
CO2: 21 mmol/L — ABNORMAL LOW (ref 22–32)
Calcium: 9 mg/dL (ref 8.9–10.3)
Chloride: 95 mmol/L — ABNORMAL LOW (ref 98–111)
Creatinine, Ser: 0.83 mg/dL (ref 0.44–1.00)
GFR, Estimated: >60 mL/min (ref >60)
Glucose, Bld: 145 mg/dL — ABNORMAL HIGH (ref 70–99)
Potassium: 4.7 mmol/L (ref 3.5–5.1)
Sodium: 127 mmol/L — ABNORMAL LOW (ref 135–145)
Total Bilirubin: 0.6 mg/dL (ref 0.3–1.2)
Total Protein: 7.7 g/dL (ref 6.5–8.1)

## 2021-10-26 LAB — C-REACTIVE PROTEIN: CRP: 7.4 mg/dL — ABNORMAL HIGH (ref <1.0)

## 2021-10-26 MED ORDER — STERILE WATER FOR INJECTION IJ SOLN
INTRAMUSCULAR | Status: AC
  Filled 2021-10-26: qty 10

## 2021-10-26 MED ORDER — GUAIFENESIN ER 600 MG PO TB12
1200.0000 mg | ORAL_TABLET | Freq: Two times a day (BID) | ORAL | Status: DC
  Administered 2021-10-26 – 2021-10-28 (×5): 1200 mg via ORAL
  Filled 2021-10-26 (×5): qty 2

## 2021-10-26 NOTE — Progress Notes (Signed)
PROGRESS NOTE  Sheila Morales GYJ:[id number] DOB: 25-Oct-1950 DOA: 10/24/2021 PCP: Hoyt Koch, MD   LOS: 1 day   Brief Narrative / Interim history: 71 y.o. 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  She was found to be COVID-positive, and a chest x-ray with suspicion for pneumonia  Subjective / 24h Interval events: She is doing a little bit better this morning, no longer has nausea.  Denies any shortness of breath but has not been walking much  Assesement and Plan: Principal Problem:   COVID-19 virus infection Active Problems:   Interstitial lung disease ([place])   Systemic lupus erythematosus (HCC)   COPD (chronic obstructive pulmonary disease) (HCC)   Insomnia   Hyponatremia   Acute hypoxemic respiratory failure ([place])   Community acquired pneumonia   Hypertension   Sinus node dysfunction (HCC)   Pacemaker   Chronic diastolic CHF (congestive heart failure) ([place])   Principal problem Acute hypoxic respiratory failure due to pneumonia caused by COVID-19-remains hypoxic this morning requiring 2 L supplemental oxygen.  She was started on molnupiravir and steroids, continue.  Continue supportive care with inhalers, supplemental oxygen, monitor closely.  Monitor inflammatory markers.   Active problems COPD, ILD, SLE -continue home medications, hold methotrexate for now in the acute setting.   History of PAF, sick sinus syndrome, pacemaker-monitor on telemetry, continue Eliquis, continue metoprolol and amiodarone   Chronic diastolic CHF -no evidence of fluid overload currently, continue home furosemide.  Essential hypertension-continue home medications  Hyponatremia, chronic-in the past this was believed to be due to SIADH.  Sodium  varying, most recent 1 done as an outpatient 2 months ago was 130.  Sodium overall stable this morning at 127.  Avoid large shifts.   Nausea, vomiting-likely due to COVID-19 infection.  Improved this morning, allow regular diet   Peripheral neuropathy-continue gabapentin  Leukopenia-due to COVID-19  Scheduled Meds:  amiodarone  200 mg Oral Daily   apixaban  5 mg Oral BID   vitamin C  500 mg Oral Daily   cycloSPORINE  1 drop Both Eyes BID   empagliflozin  10 mg Oral QAC breakfast   folic acid  1 mg Oral QPM   furosemide  20 mg Oral Daily   gabapentin  200 mg Oral TID   guaiFENesin  1,200 mg Oral BID   hydroxychloroquine  200 mg Oral Daily   methylPREDNISolone (SOLU-MEDROL) injection  60 mg Intravenous Q12H   Followed by   Derrill Memo ON 10/28/2021] predniSONE  50 mg Oral Daily   metoprolol tartrate  25 mg Oral BID   molnupiravir EUA  4 capsule Oral BID   sodium chloride flush  3 mL Intravenous Q12H   umeclidinium-vilanterol  1 puff Inhalation Daily   Vitamin E  400 Units Oral Daily   Continuous Infusions:  sodium chloride     PRN Meds:.sodium chloride, acetaminophen, albuterol, benzonatate, chlorpheniramine-HYDROcodone, guaiFENesin-dextromethorphan, ondansetron **OR** ondansetron (ZOFRAN) IV, mouth rinse, sodium chloride flush, zolpidem  Diet Orders (From admission, onward)     Start     Ordered   10/25/21 2240  Diet regular Room service appropriate? Yes; Fluid consistency: Thin  Diet effective now       Comments: Styrofoam trays d/t COVID precautions!!  Question Answer Comment  Room service appropriate? Yes   Fluid consistency:  Thin      10/25/21 2242            DVT prophylaxis:  apixaban (ELIQUIS) tablet 5 mg   Lab Results  Component Value Date   PLT 161 10/26/2021      Code Status: Full Code  Family Communication: No family present at bedside  Status is: Inpatient  Remains inpatient appropriate because: persistent symptoms weakness  Level of care:  Telemetry  Consultants:  none  Objective: Vitals:   10/26/21 0517 10/26/21 0554 10/26/21 0605 10/26/21 0741  BP: (!) 173/109 (!) 156/102 (!) 164/104 (!) 168/126  Pulse: 64 79 74 79  Resp: '20 20  16  '$ Temp: 98.9 F (37.2 C) 98.8 F (37.1 C)  98.4 F (36.9 C)  TempSrc:    Oral  SpO2: 100% 100% (!) 80% 98%  Weight:      Height:        Intake/Output Summary (Last 24 hours) at 10/26/2021 1030 Last data filed at 10/26/2021 0900 Gross per 24 hour  Intake 596 ml  Output 200 ml  Net 396 ml   Wt Readings from Last 3 Encounters:  10/24/21 57.2 kg  09/28/21 59.4 kg  09/27/21 59.8 kg    Examination:  Constitutional: NAD Eyes: no scleral icterus ENMT: Mucous membranes are moist.  Neck: normal, supple Respiratory: Diminished at the bases but overall moves air well, no wheezing heard Cardiovascular: Regular rate and rhythm, no murmurs / rubs / gallops.  No peripheral edema Abdomen: non distended, no tenderness. Bowel sounds positive.  Musculoskeletal: no clubbing / cyanosis.  Skin: no rashes Neurologic: Grossly nonfocal   Data Reviewed: I have independently reviewed following labs and imaging studies   CBC Recent Labs  Lab 10/24/21 1800 10/26/21 0704  WBC 3.3* 3.1*  HGB 11.1* 13.7  HCT 33.1* 42.4  PLT 145* 161  MCV 92.5 95.7  MCH 31.0 30.9  MCHC 33.5 32.3  RDW 14.1 14.4  LYMPHSABS 0.7 0.5*  MONOABS 0.4 0.2  EOSABS 0.0 0.0  BASOSABS 0.0 0.0    Recent Labs  Lab 10/24/21 1800 10/24/21 2357 10/26/21 0704  NA 127*  --  127*  K 4.1  --  4.7  CL 97*  --  95*  CO2 19*  --  21*  GLUCOSE 96  --  145*  BUN 14  --  16  CREATININE 1.24*  --  0.83  CALCIUM 8.7*  --  9.0  AST 34  --  51*  ALT 33  --  41  ALKPHOS 71  --  79  BILITOT 0.4  --  0.6  ALBUMIN 3.7  --  3.5  CRP  --  3.6*  --   DDIMER  --  0.50  --   PROCALCITON  --  <0.10  --   LATICACIDVEN  --  1.8  --      ------------------------------------------------------------------------------------------------------------------ Recent Labs    10/24/21 2357  TRIG 167*    Lab Results  Component Value Date   HGBA1C 5.7 02/24/2017   ------------------------------------------------------------------------------------------------------------------ No results for input(s): "TSH", "T4TOTAL", "T3FREE", "THYROIDAB" in the last 72 hours.  Invalid input(s): "FREET3"  Cardiac Enzymes No results for input(s): "CKMB", "TROPONINI", "MYOGLOBIN" in the last 168 hours.  Invalid input(s): "CK" ------------------------------------------------------------------------------------------------------------------    Component Value Date/Time   BNP 757.9 (H) 07/29/2021 1919    CBG: No results for input(s): "GLUCAP" in the last 168 hours.  Recent Results (from the past 240 hour(s))  Resp Panel by RT-PCR (Flu A&B, Covid)  Anterior Nasal Swab     Status: Abnormal   Collection Time: 10/24/21  6:01 PM   Specimen: Anterior Nasal Swab  Result Value Ref Range Status   SARS Coronavirus 2 by RT PCR POSITIVE (A) NEGATIVE Final    Comment: (NOTE) SARS-CoV-2 target nucleic acids are DETECTED.  The SARS-CoV-2 RNA is generally detectable in upper respiratory specimens during the acute phase of infection. Positive results are indicative of the presence of the identified virus, but do not rule out bacterial infection or co-infection with other pathogens not detected by the test. Clinical correlation with patient history and other diagnostic information is necessary to determine patient infection status. The expected result is Negative.  Fact Sheet for Patients: EntrepreneurPulse.com.au  Fact Sheet for Healthcare Providers: IncredibleEmployment.be  This test is not yet approved or cleared by the [place] FDA and  has been authorized for detection and/or diagnosis of  SARS-CoV-2 by FDA under an Emergency Use Authorization (EUA).  This EUA will remain in effect (meaning this test can be used) for the duration of  the COVID-19 declaration under Section 564(b)(1) of the A ct, 21 U.S.C. section 360bbb-3(b)(1), unless the authorization is terminated or revoked sooner.     Influenza A by PCR NEGATIVE NEGATIVE Final   Influenza B by PCR NEGATIVE NEGATIVE Final    Comment: (NOTE) The Xpert Xpress SARS-CoV-2/FLU/RSV plus assay is intended as an aid in the diagnosis of influenza from Nasopharyngeal swab specimens and should not be used as a sole basis for treatment. Nasal washings and aspirates are unacceptable for Xpert Xpress SARS-CoV-2/FLU/RSV testing.  Fact Sheet for Patients: EntrepreneurPulse.com.au  Fact Sheet for Healthcare Providers: IncredibleEmployment.be  This test is not yet approved or cleared by the [place] FDA and has been authorized for detection and/or diagnosis of SARS-CoV-2 by FDA under an Emergency Use Authorization (EUA). This EUA will remain in effect (meaning this test can be used) for the duration of the COVID-19 declaration under Section 564(b)(1) of the Act, 21 U.S.C. section 360bbb-3(b)(1), unless the authorization is terminated or revoked.  Performed at [facility], [address], [place], [place] [address]      Radiology Studies: No results found.   Marzetta Board, MD, PhD [facility]  Between 7 am - 7 pm I am available, please contact me via Amion (for emergencies) or Securechat (non urgent messages)  Between 7 pm - 7 am I am not available, please contact night coverage MD/APP via Amion

## 2021-10-26 NOTE — Plan of Care (Signed)

## 2021-10-27 DIAGNOSIS — U071 COVID-19: Secondary | ICD-10-CM | POA: Diagnosis not present

## 2021-10-27 LAB — CBC WITH DIFFERENTIAL/PLATELET
Abs Immature Granulocytes: 0.03 10*3/uL (ref 0.00–0.07)
Basophils Absolute: 0 10*3/uL (ref 0.0–0.1)
Basophils Relative: 0 %
Eosinophils Absolute: 0 10*3/uL (ref 0.0–0.5)
Eosinophils Relative: 0 %
HCT: 39 % (ref 36.0–46.0)
Hemoglobin: 12.7 g/dL (ref 12.0–15.0)
Immature Granulocytes: 1 %
Lymphocytes Relative: 10 %
Lymphs Abs: 0.7 10*3/uL (ref 0.7–4.0)
MCH: 30.7 pg (ref 26.0–34.0)
MCHC: 32.6 g/dL (ref 30.0–36.0)
MCV: 94.2 fL (ref 80.0–100.0)
Monocytes Absolute: 0.4 10*3/uL (ref 0.1–1.0)
Monocytes Relative: 5 %
Neutro Abs: 5.5 10*3/uL (ref 1.7–7.7)
Neutrophils Relative %: 84 %
Platelets: 223 10*3/uL (ref 150–400)
RBC: 4.14 MIL/uL (ref 3.87–5.11)
RDW: 14.1 % (ref 11.5–15.5)
WBC: 6.6 10*3/uL (ref 4.0–10.5)
nRBC: 0 % (ref 0.0–0.2)

## 2021-10-27 LAB — COMPREHENSIVE METABOLIC PANEL
ALT: 36 U/L (ref 0–44)
AST: 35 U/L (ref 15–41)
Albumin: 3.2 g/dL — ABNORMAL LOW (ref 3.5–5.0)
Alkaline Phosphatase: 71 U/L (ref 38–126)
Anion gap: 9 (ref 5–15)
BUN: 26 mg/dL — ABNORMAL HIGH (ref 8–23)
CO2: 27 mmol/L (ref 22–32)
Calcium: 9.5 mg/dL (ref 8.9–10.3)
Chloride: 97 mmol/L — ABNORMAL LOW (ref 98–111)
Creatinine, Ser: 1.23 mg/dL — ABNORMAL HIGH (ref 0.44–1.00)
GFR, Estimated: 47 mL/min — ABNORMAL LOW (ref >60)
Glucose, Bld: 136 mg/dL — ABNORMAL HIGH (ref 70–99)
Potassium: 4.9 mmol/L (ref 3.5–5.1)
Sodium: 133 mmol/L — ABNORMAL LOW (ref 135–145)
Total Bilirubin: 0.7 mg/dL (ref 0.3–1.2)
Total Protein: 7.1 g/dL (ref 6.5–8.1)

## 2021-10-27 LAB — C-REACTIVE PROTEIN: CRP: 3 mg/dL — ABNORMAL HIGH (ref <1.0)

## 2021-10-27 MED ORDER — STERILE WATER FOR INJECTION IJ SOLN
INTRAMUSCULAR | Status: AC
  Administered 2021-10-27: 10 mL
  Filled 2021-10-27: qty 10

## 2021-10-27 NOTE — Plan of Care (Signed)

## 2021-10-27 NOTE — Evaluation (Signed)
Physical Therapy Evaluation Patient Details Name: Sheila Morales MRN: [id number] DOB: 09/07/1950 Today's Date: 10/27/2021  History of Present Illness  71 y.o. 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  She was found to be COVID-positive, and a chest x-ray with suspicion for pneumonia.  Clinical Impression  Pt admitted with above diagnosis. Pt ambulated 35' with RW in room, SpO2 85% on room air walking, no loss of balance. At present, pt qualifies for home O2.  Pt currently with functional limitations due to the deficits listed below (see PT Problem List). Pt will benefit from skilled PT to increase their independence and safety with mobility to allow discharge to the venue listed below.          Recommendations for follow up therapy are one component of a multi-disciplinary discharge planning process, led by the attending physician.  Recommendations may be updated based on patient status, additional functional criteria and insurance authorization.  Follow Up Recommendations Home health PT      Assistance Recommended at Discharge Intermittent Supervision/Assistance  Patient can return home with the following  Assist for transportation;Help with stairs or ramp for entrance;Assistance with cooking/housework    Equipment Recommendations None recommended by PT  Recommendations for Other Services       Functional Status Assessment Patient has had a recent decline in their functional status and demonstrates the ability to make significant improvements in function in a reasonable and predictable amount of time.     Precautions / Restrictions Precautions Precautions: Other (comment) Precaution Comments: monitor O2 Restrictions Weight Bearing  Restrictions: No      Mobility  Bed Mobility Overal bed mobility: Modified Independent             General bed mobility comments: used rail, HOB up    Transfers Overall transfer level: Modified independent Equipment used: Rolling walker (2 wheels)                    Ambulation/Gait Ambulation/Gait assistance: Supervision Gait Distance (Feet): 35 Feet Assistive device: Rolling walker (2 wheels) Gait Pattern/deviations: WFL(Within Functional Limits) Gait velocity: WFL     General Gait Details: 89% room air rest, 85% RA walking, 94% 2L rest; no loss of balance  Stairs            Wheelchair Mobility    Modified Rankin (Stroke Patients Only)       Balance                                             Pertinent Vitals/Pain Pain Assessment Pain Assessment: No/denies pain    Home Living Family/patient expects to be discharged to:: Private residence Living Arrangements: Spouse/significant other Available Help at Discharge: Family;Available 24 hours/day Type of Home: House Home Access: Stairs to enter Entrance Stairs-Rails: None Entrance Stairs-Number of Steps: 2   Home Layout: One level Home Equipment: International aid/development worker (2 wheels);Cane - single point;Hand held shower head;Grab bars - toilet      Prior Function Prior Level of Function : Independent/Modified Independent             Mobility Comments: walks with RW prn ADLs Comments: independent, sometimes uses  shower seat     Hand Dominance        Extremity/Trunk Assessment   Upper Extremity Assessment Upper Extremity Assessment: Overall WFL for tasks assessed    Lower Extremity Assessment Lower Extremity Assessment: Overall WFL for tasks assessed    Cervical / Trunk Assessment Cervical / Trunk Assessment: Normal  Communication   Communication: HOH  Cognition Arousal/Alertness: Awake/alert Behavior During Therapy: WFL for tasks  assessed/performed Overall Cognitive Status: Within Functional Limits for tasks assessed                                          General Comments General comments (skin integrity, edema, etc.): SpO2 85% on room air walking, 89% room air rest    Exercises     Assessment/Plan    PT Assessment Patient needs continued PT services  PT Problem List Decreased activity tolerance;Cardiopulmonary status limiting activity;Decreased mobility       PT Treatment Interventions Gait training;Therapeutic exercise;Functional mobility training;Therapeutic activities;Patient/family education    PT Goals (Current goals can be found in the Care Plan section)  Acute Rehab PT Goals Patient Stated Goal: to get strength back PT Goal Formulation: With patient/family Time For Goal Achievement: 11/10/21 Potential to Achieve Goals: Good    Frequency Min 3X/week     Co-evaluation               AM-PAC PT "6 Clicks" Mobility  Outcome Measure Help needed turning from your back to your side while in a flat bed without using bedrails?: None Help needed moving from lying on your back to sitting on the side of a flat bed without using bedrails?: None Help needed moving to and from a bed to a chair (including a wheelchair)?: None Help needed standing up from a chair using your arms (e.g., wheelchair or bedside chair)?: None Help needed to walk in hospital room?: None Help needed climbing 3-5 steps with a railing? : A Little 6 Click Score: 23    End of Session Equipment Utilized During Treatment: Oxygen Activity Tolerance: Patient tolerated treatment well Patient left: in chair;with chair alarm set;with call bell/phone within reach;with family/visitor present Nurse Communication: Mobility status PT Visit Diagnosis: Other abnormalities of gait and mobility (R26.89)    Time: 1610-9604 PT Time Calculation (min) (ACUTE ONLY): 17 min   Charges:   PT Evaluation $PT Eval Moderate  Complexity: 1 Mod          Philomena Doheny PT 10/27/2021  Acute Rehabilitation Services  Office [phone]

## 2021-10-27 NOTE — Progress Notes (Signed)
PROGRESS NOTE  PEACE NOYES XKG:[id number] DOB: 1950/12/04 DOA: 10/24/2021 PCP: Hoyt Koch, MD   LOS: 2 days   Brief Narrative / Interim history: 71 y.o. 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  She was found to be COVID-positive, and a chest x-ray with suspicion for pneumonia  Subjective / 24h Interval events: No significant complaints this morning.  States her breathing is well, no nausea or vomiting.  Feels like she has more energy.  Assesement and Plan: Principal Problem:   COVID-19 virus infection Active Problems:   Interstitial lung disease ([place])   Systemic lupus erythematosus (HCC)   COPD (chronic obstructive pulmonary disease) (HCC)   Insomnia   Hyponatremia   Acute hypoxemic respiratory failure ([place])   Community acquired pneumonia   Hypertension   Sinus node dysfunction (HCC)   Pacemaker   Chronic diastolic CHF (congestive heart failure) ([place])   Principal problem Acute hypoxic respiratory failure due to pneumonia caused by COVID-19-remains hypoxic, continues to require 2 L.  She will work with PT today, will determine whether she needs oxygen at home given her underlying lung disease. She was started on molnupiravir and steroids, continue.  Continue supportive care with inhalers, supplemental oxygen, monitor closely.  If she does well today consider discharging home tomorrow   Active problems COPD, ILD, SLE -continue home medications, hold methotrexate for now in the acute setting.   History of PAF, sick sinus syndrome, pacemaker-monitor on telemetry, continue Eliquis, continue metoprolol and amiodarone.  Telemetry shows paced rhythm in the 60s   Chronic diastolic CHF -no evidence of fluid overload currently,  continue home furosemide  Essential hypertension-continue home medications  Hyponatremia, chronic-in the past this was believed to be due to SIADH.  Sodium varying, most recent 1 done as an outpatient 2 months ago was 130.  Sodium level with better today, 133   Nausea, vomiting-likely due to COVID-19 infection.  Resolved   Peripheral neuropathy-continue gabapentin  Leukopenia-due to COVID-19  Scheduled Meds:  amiodarone  200 mg Oral Daily   apixaban  5 mg Oral BID   vitamin C  500 mg Oral Daily   cycloSPORINE  1 drop Both Eyes BID   empagliflozin  10 mg Oral QAC breakfast   folic acid  1 mg Oral QPM   furosemide  20 mg Oral Daily   gabapentin  200 mg Oral TID   guaiFENesin  1,200 mg Oral BID   hydroxychloroquine  200 mg Oral Daily   methylPREDNISolone (SOLU-MEDROL) injection  60 mg Intravenous Q12H   Followed by   Derrill Memo ON 10/28/2021] predniSONE  50 mg Oral Daily   metoprolol tartrate  25 mg Oral BID   molnupiravir EUA  4 capsule Oral BID   sodium chloride flush  3 mL Intravenous Q12H   umeclidinium-vilanterol  1 puff Inhalation Daily   Vitamin E  400 Units Oral Daily   Continuous Infusions:  sodium chloride     PRN Meds:.sodium chloride, acetaminophen, albuterol, benzonatate, chlorpheniramine-HYDROcodone, guaiFENesin-dextromethorphan, ondansetron **OR** ondansetron (ZOFRAN) IV, mouth rinse, sodium chloride flush, zolpidem  Diet Orders (From admission, onward)     Start     Ordered   10/25/21 2240  Diet regular Room service appropriate? Yes; Fluid consistency: Thin  Diet effective now       Comments:  Styrofoam trays d/t COVID precautions!!  Question Answer Comment  Room service appropriate? Yes   Fluid consistency: Thin      10/25/21 2242            DVT prophylaxis:  apixaban (ELIQUIS) tablet 5 mg   Lab Results  Component Value Date   PLT 223 10/27/2021      Code Status: Full Code  Family Communication: Husband at bedside  Status is:  Inpatient  Remains inpatient appropriate because: persistent symptoms weakness  Level of care: Telemetry  Consultants:  none  Objective: Vitals:   10/26/21 1133 10/26/21 1430 10/26/21 1959 10/27/21 0550  BP: 116/86 104/90 112/70 (!) 156/99  Pulse: 72 74 60 60  Resp:  '16 20 20  '$ Temp:  (!) 97.5 F (36.4 C) 98.5 F (36.9 C) 97.8 F (36.6 C)  TempSrc:    Oral  SpO2:  99% 99% 96%  Weight:      Height:        Intake/Output Summary (Last 24 hours) at 10/27/2021 1003 Last data filed at 10/27/2021 0830 Gross per 24 hour  Intake 840 ml  Output --  Net 840 ml    Wt Readings from Last 3 Encounters:  10/24/21 57.2 kg  09/28/21 59.4 kg  09/27/21 59.8 kg    Examination:  Constitutional: NAD Eyes: lids and conjunctivae normal, no scleral icterus ENMT: mmm Neck: normal, supple Respiratory: Faint bibasilar rhonchi, no wheezing Cardiovascular: Regular rate and rhythm, no murmurs / rubs / gallops. No LE edema. Abdomen: soft, no distention, no tenderness. Bowel sounds positive.  Skin: no rashes   Data Reviewed: I have independently reviewed following labs and imaging studies   CBC Recent Labs  Lab 10/24/21 1800 10/26/21 0704 10/27/21 0808  WBC 3.3* 3.1* 6.6  HGB 11.1* 13.7 12.7  HCT 33.1* 42.4 39.0  PLT 145* 161 223  MCV 92.5 95.7 94.2  MCH 31.0 30.9 30.7  MCHC 33.5 32.3 32.6  RDW 14.1 14.4 14.1  LYMPHSABS 0.7 0.5* 0.7  MONOABS 0.4 0.2 0.4  EOSABS 0.0 0.0 0.0  BASOSABS 0.0 0.0 0.0     Recent Labs  Lab 10/24/21 1800 10/24/21 2357 10/26/21 0704 10/26/21 0946 10/27/21 0808  NA 127*  --  127*  --  133*  K 4.1  --  4.7  --  4.9  CL 97*  --  95*  --  97*  CO2 19*  --  21*  --  27  GLUCOSE 96  --  145*  --  136*  BUN 14  --  16  --  26*  CREATININE 1.24*  --  0.83  --  1.23*  CALCIUM 8.7*  --  9.0  --  9.5  AST 34  --  51*  --  35  ALT 33  --  41  --  36  ALKPHOS 71  --  79  --  71  BILITOT 0.4  --  0.6  --  0.7  ALBUMIN 3.7  --  3.5  --  3.2*  CRP  --   3.6*  --  7.4*  --   DDIMER  --  0.50  --   --   --   PROCALCITON  --  <0.10  --   --   --   LATICACIDVEN  --  1.8  --   --   --      ------------------------------------------------------------------------------------------------------------------ Recent Labs    10/24/21 2357  TRIG 167*     Lab Results  Component Value Date   HGBA1C 5.7 02/24/2017   ------------------------------------------------------------------------------------------------------------------ No results for input(s): "TSH", "T4TOTAL", "T3FREE", "THYROIDAB" in the last 72 hours.  Invalid input(s): "FREET3"  Cardiac Enzymes No results for input(s): "CKMB", "TROPONINI", "MYOGLOBIN" in the last 168 hours.  Invalid input(s): "CK" ------------------------------------------------------------------------------------------------------------------    Component Value Date/Time   BNP 757.9 (H) 07/29/2021 1919    CBG: No results for input(s): "GLUCAP" in the last 168 hours.  Recent Results (from the past 240 hour(s))  Resp Panel by RT-PCR (Flu A&B, Covid) Anterior Nasal Swab     Status: Abnormal   Collection Time: 10/24/21  6:01 PM   Specimen: Anterior Nasal Swab  Result Value Ref Range Status   SARS Coronavirus 2 by RT PCR POSITIVE (A) NEGATIVE Final    Comment: (NOTE) SARS-CoV-2 target nucleic acids are DETECTED.  The SARS-CoV-2 RNA is generally detectable in upper respiratory specimens during the acute phase of infection. Positive results are indicative of the presence of the identified virus, but do not rule out bacterial infection or co-infection with other pathogens not detected by the test. Clinical correlation with patient history and other diagnostic information is necessary to determine patient infection status. The expected result is Negative.  Fact Sheet for Patients: EntrepreneurPulse.com.au  Fact Sheet for Healthcare  Providers: IncredibleEmployment.be  This test is not yet approved or cleared by the [place] FDA and  has been authorized for detection and/or diagnosis of SARS-CoV-2 by FDA under an Emergency Use Authorization (EUA).  This EUA will remain in effect (meaning this test can be used) for the duration of  the COVID-19 declaration under Section 564(b)(1) of the A ct, 21 U.S.C. section 360bbb-3(b)(1), unless the authorization is terminated or revoked sooner.     Influenza A by PCR NEGATIVE NEGATIVE Final   Influenza B by PCR NEGATIVE NEGATIVE Final    Comment: (NOTE) The Xpert Xpress SARS-CoV-2/FLU/RSV plus assay is intended as an aid in the diagnosis of influenza from Nasopharyngeal swab specimens and should not be used as a sole basis for treatment. Nasal washings and aspirates are unacceptable for Xpert Xpress SARS-CoV-2/FLU/RSV testing.  Fact Sheet for Patients: EntrepreneurPulse.com.au  Fact Sheet for Healthcare Providers: IncredibleEmployment.be  This test is not yet approved or cleared by the [place] FDA and has been authorized for detection and/or diagnosis of SARS-CoV-2 by FDA under an Emergency Use Authorization (EUA). This EUA will remain in effect (meaning this test can be used) for the duration of the COVID-19 declaration under Section 564(b)(1) of the Act, 21 U.S.C. section 360bbb-3(b)(1), unless the authorization is terminated or revoked.  Performed at [facility], [address] [place], [place] [address]   Blood Culture (routine x 2)     Status: None (Preliminary result)   Collection Time: 10/24/21 11:56 PM   Specimen: BLOOD  Result Value Ref Range Status   Specimen Description   Final    BLOOD Performed at [facility], [address] [place], [place] [address]    Special Requests   Final    NONE Performed at [facility], [address] [place], [place] [address]    Culture   Final    NO GROWTH 2 DAYS Performed at [facility], [place] [address] [place], [place] [address]    Report Status PENDING  Incomplete  Blood Culture (routine x 2)     Status: None (Preliminary result)   Collection Time: 10/25/21  1:29 AM   Specimen: BLOOD  Result  Value Ref Range Status   Specimen Description   Final    BLOOD Performed at [facility], [address] [place], [place] [address]    Special Requests   Final    NONE Performed at [facility], [address], [place], [place] [address]    Culture   Final    NO GROWTH 2 DAYS Performed at [facility], [place] [address], [place],  [address]    Report Status PENDING  Incomplete     Radiology Studies: No results found.   Marzetta Board, MD, PhD [facility]  Between 7 am - 7 pm I am available, please contact me via Amion (for emergencies) or Securechat (non urgent messages)  Between 7 pm - 7 am I am not available, please contact night coverage MD/APP via Amion

## 2021-10-28 DIAGNOSIS — U071 COVID-19: Secondary | ICD-10-CM | POA: Diagnosis not present

## 2021-10-28 MED ORDER — GUAIFENESIN ER 600 MG PO TB12: 1200.0000 mg | ORAL_TABLET | Freq: Two times a day (BID) | ORAL | 0 refills | Status: AC

## 2021-10-28 MED ORDER — PREDNISONE 20 MG PO TABS: 40.0000 mg | ORAL_TABLET | Freq: Every day | ORAL | 0 refills | Status: AC

## 2021-10-28 MED ORDER — STERILE WATER FOR INJECTION IJ SOLN
INTRAMUSCULAR | Status: AC
  Administered 2021-10-28: 10 mL
  Filled 2021-10-28: qty 10

## 2021-10-28 MED ORDER — MOLNUPIRAVIR EUA 200MG CAPSULE: 4.0000 | ORAL_CAPSULE | Freq: Two times a day (BID) | ORAL | 0 refills | Status: AC

## 2021-10-28 NOTE — Discharge Summary (Signed)
Physician Discharge Summary  Sheila Morales EPP:[id number] DOB: 07/15/1950 DOA: 10/24/2021  PCP: Hoyt Koch, MD  Admit date: 10/24/2021 Discharge date: 10/28/2021  Admitted From: home Disposition:  home  Recommendations for Outpatient Follow-up:  Follow up with PCP in 1-2 weeks Please obtain BMP/CBC in one week  Home Health: PT Equipment/Devices: home O2  Discharge Condition: stable CODE STATUS: Full code Diet Orders (From admission, onward)     Start     Ordered   10/25/21 2240  Diet regular Room service appropriate? Yes; Fluid consistency: Thin  Diet effective now       Comments: Styrofoam trays d/t COVID precautions!!  Question Answer Comment  Room service appropriate? Yes   Fluid consistency: Thin      10/25/21 2242            HPI: Per admitting MD, Sheila Morales is a 71 y.o. 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  She is actively vomiting during my interview and does not wear her hearing aids so history is somewhat limited but husband contributes.  She has been feeling poorly for about 4 days.  Over the last day she also has experienced nausea and vomiting.  She has been experiencing fatigue, rhinorrhea, decreased appetite, headache and shortness of breath.  She had similar episodes about a month ago, and underwent treatment with doxycycline and prednisone taper at that time.  Given persistent symptoms and high fever husband brought her to urgent care and directed her to the ER.  They subsequently presented to [facility] Course / Discharge diagnoses: Principal Problem:   COVID-19 virus infection Active Problems:   Interstitial lung disease ([place])   Systemic lupus erythematosus ([place])   COPD (chronic  obstructive pulmonary disease) (HCC)   Insomnia   Hyponatremia   Acute hypoxemic respiratory failure (HCC)   Community acquired pneumonia   Hypertension   Sinus node dysfunction (HCC)   Pacemaker   Chronic diastolic CHF (congestive heart failure) ([place])  Principal problem Acute hypoxic respiratory failure due to pneumonia caused by COVID-19-patient was admitted to the hospital with respiratory failure in the setting of COVID-19 pneumonia.  She has required 2 L of oxygen.  She was placed on inhalers, steroids along with antivirals with improvement in her respiratory status and clinical condition.  Her weakness is improved, appetite has returned, nausea has resolved and she is feeling close to baseline.  She is able to work with PT.  Clinically has improved, will be discharged home in stable condition, however due to her underlying lung disease she is still requiring oxygen upon discharge.  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  Sodium overall stable  Nausea, vomiting-likely due to COVID-19 infection.  Resolved Peripheral neuropathy-continue gabapentin Leukopenia-due to COVID-19  Sepsis ruled out   Discharge Instructions   Allergies as of 10/28/2021   No Known Allergies      Medication List     TAKE these  medications    Advanced Joint Relief Caps Take 1 capsule by mouth daily. Joint Advantage Gold 5x   albuterol 108 (90 Base) MCG/ACT inhaler Commonly known as: VENTOLIN HFA Inhale 1-2 puffs into the lungs every 6 (six) hours as needed for wheezing or shortness of breath.    amiodarone 200 MG tablet Commonly known as: PACERONE Take 1 tablet (200 mg total) by mouth daily.   Anoro Ellipta 62.5-25 MCG/ACT Aepb Generic drug: umeclidinium-vilanterol INHALE 1 PUFF BY MOUTH EVERY DAY   apixaban 5 MG Tabs tablet Commonly known as: ELIQUIS Take 1 tablet (5 mg total) by mouth 2 (two) times daily.   benzonatate 200 MG capsule Commonly known as: TESSALON Take 1 capsule (200 mg total) by mouth 3 (three) times daily as needed for cough.   calcium carbonate 1250 (500 Ca) MG tablet Commonly known as: OS-CAL - dosed in mg of elemental calcium Take 1 tablet by mouth every evening.   empagliflozin 10 MG Tabs tablet Commonly known as: JARDIANCE Take 1 tablet (10 mg total) by mouth daily before breakfast.   fluticasone 50 MCG/ACT nasal spray Commonly known as: FLONASE SPRAY 2 SPRAYS INTO EACH NOSTRIL EVERY DAY   folic acid 1 MG tablet Commonly known as: FOLVITE Take 1 mg by mouth every evening.   furosemide 20 MG tablet Commonly known as: LASIX TAKE 1 TABLET (20 MG) BY MOUTH DAILY, TAKE 1 EXTRA TABLET BY MOUTH (20 MG) AS NEEDED FOR INCREASED SWELLING.   gabapentin 300 MG capsule Commonly known as: NEURONTIN TAKE TWO CAPSULES BY MOUTH EVERY MORNING, TWO CAPSULES EACH AFTERNOON AND THREE CAPSULES AT BEDTIME   guaiFENesin 600 MG 12 hr tablet Commonly known as: MUCINEX Take 2 tablets (1,200 mg total) by mouth 2 (two) times daily for 6 days.   hydroxychloroquine 200 MG tablet Commonly known as: PLAQUENIL Take 200 mg by mouth daily.   methotrexate 2.5 MG tablet Take 10 mg by mouth every Thursday.   metoprolol tartrate 25 MG tablet Commonly known as: LOPRESSOR Take 1 tablet (25 mg total) by mouth 2 (two) times daily.   molnupiravir EUA 200 mg Caps capsule Commonly known as: LAGEVRIO Take 4 capsules (800 mg total) by mouth 2 (two) times daily for 3 doses.   multivitamin with minerals Tabs tablet Take 1 tablet by mouth daily.   predniSONE 20 MG  tablet Commonly known as: DELTASONE Take 2 tablets (40 mg total) by mouth daily for 6 days.   Restasis 0.05 % ophthalmic emulsion Generic drug: cycloSPORINE Place 1 drop into both eyes 2 (two) times daily.   triamcinolone cream 0.1 % Commonly known as: KENALOG Apply 1 application. topically 2 (two) times daily. What changed:  when to take this reasons to take this   vitamin C 500 MG tablet Commonly known as: ASCORBIC ACID Take 500 mg by mouth daily.   Vitamin D 50 MCG (2000 UT) tablet Take 2,000 Units by mouth every evening.   vitamin E 180 MG (400 UNITS) capsule Take 400 Units by mouth daily.   zolpidem 5 MG tablet Commonly known as: AMBIEN TAKE 1 TABLET BY MOUTH AT BEDTIME AS NEEDED FOR SLEEP.               Durable Medical Equipment  (From admission, onward)           Start     Ordered   10/27/21 1258  For home use only DME oxygen  Once       Question Answer Comment  Length  of Need 6 Months   Mode or (Route) Nasal cannula   Liters per Minute 2   Frequency Continuous (stationary and portable oxygen unit needed)   Oxygen delivery system Gas      10/27/21 1258             Consultations: none  Procedures/Studies:  DG Chest 2 View  Result Date: 10/24/2021 CLINICAL DATA:  Fever. EXAM: CHEST - 2 VIEW COMPARISON:  09/28/2021 FINDINGS: Ill-defined patchy opacity in the right upper lobe, new from prior radiograph. There are lesser ill-defined left perihilar and bilateral infrahilar opacities. Stable heart size with left-sided pacemaker in place. No pneumothorax or significant effusion. No acute osseous abnormalities are seen. IMPRESSION: Ill-defined patchy opacity in the right upper lobe, suspicious for pneumonia. There are lesser ill-defined left perihilar and bilateral infrahilar opacities, atelectasis versus additional pneumonia. Recommend radiographic follow-up after course of treatment to ensure resolution. Electronically Signed   By: Keith Rake  M.D.   On: 10/24/2021 18:41   DG Chest 2 View  Result Date: 09/28/2021 CLINICAL DATA:  Cough for 2 weeks, acute bronchitis with COPD EXAM: CHEST - 2 VIEW COMPARISON:  07/03/2021 FINDINGS: LEFT subclavian sequential transvenous pacemaker leads project at RIGHT atrium and RIGHT ventricle. Enlargement of cardiac silhouette. Mediastinal contours and pulmonary vascularity normal. Atherosclerotic calcification aorta. Chronic accentuation of interstitial markings slightly improved in RIGHT upper lobe and LEFT perihilar region since previous exam. No definite acute infiltrate, pleural effusion, or pneumothorax. Bones demineralized. IMPRESSION: Mild enlargement of cardiac silhouette post pacemaker. Improved interstitial infiltrates versus prior exam. Aortic Atherosclerosis (ICD10-I70.0). Electronically Signed   By: Lavonia Dana M.D.   On: 09/28/2021 16:14     Subjective: - no chest pain, shortness of breath, no abdominal pain, nausea or vomiting.   Discharge Exam: BP (!) 150/86 (BP Location: Left Arm)   Pulse 73   Temp 98.3 F (36.8 C) (Oral)   Resp 18   Ht '5\' 2"'$  (1.575 m)   Wt 57.2 kg   SpO2 96%   BMI 23.05 kg/m   General: Pt is alert, awake, not in acute distress Cardiovascular: RRR, S1/S2 +, no rubs, no gallops Respiratory: CTA bilaterally, no wheezing, no rhonchi Abdominal: Soft, NT, ND, bowel sounds + Extremities: no edema, no cyanosis    The results of significant diagnostics from this hospitalization (including imaging, microbiology, ancillary and laboratory) are listed below for reference.     Microbiology: Recent Results (from the past 240 hour(s))  Resp Panel by RT-PCR (Flu A&B, Covid) Anterior Nasal Swab     Status: Abnormal   Collection Time: 10/24/21  6:01 PM   Specimen: Anterior Nasal Swab  Result Value Ref Range Status   SARS Coronavirus 2 by RT PCR POSITIVE (A) NEGATIVE Final    Comment: (NOTE) SARS-CoV-2 target nucleic acids are DETECTED.  The SARS-CoV-2 RNA is  generally detectable in upper respiratory specimens during the acute phase of infection. Positive results are indicative of the presence of the identified virus, but do not rule out bacterial infection or co-infection with other pathogens not detected by the test. Clinical correlation with patient history and other diagnostic information is necessary to determine patient infection status. The expected result is Negative.  Fact Sheet for Patients: EntrepreneurPulse.com.au  Fact Sheet for Healthcare Providers: IncredibleEmployment.be  This test is not yet approved or cleared by the [place] FDA and  has been authorized for detection and/or diagnosis of SARS-CoV-2 by FDA under an Emergency Use Authorization (EUA).  This EUA will  remain in effect (meaning this test can be used) for the duration of  the COVID-19 declaration under Section 564(b)(1) of the A ct, 21 U.S.C. section 360bbb-3(b)(1), unless the authorization is terminated or revoked sooner.     Influenza A by PCR NEGATIVE NEGATIVE Final   Influenza B by PCR NEGATIVE NEGATIVE Final    Comment: (NOTE) The Xpert Xpress SARS-CoV-2/FLU/RSV plus assay is intended as an aid in the diagnosis of influenza from Nasopharyngeal swab specimens and should not be used as a sole basis for treatment. Nasal washings and aspirates are unacceptable for Xpert Xpress SARS-CoV-2/FLU/RSV testing.  Fact Sheet for Patients: EntrepreneurPulse.com.au  Fact Sheet for Healthcare Providers: IncredibleEmployment.be  This test is not yet approved or cleared by the [place] FDA and has been authorized for detection and/or diagnosis of SARS-CoV-2 by FDA under an Emergency Use Authorization (EUA). This EUA will remain in effect (meaning this test can be used) for the duration of the COVID-19 declaration under Section 564(b)(1) of the Act, 21 U.S.C. section 360bbb-3(b)(1),  unless the authorization is terminated or revoked.  Performed at [facility], [address] [place], [place] [address]   Blood Culture (routine x 2)     Status: None (Preliminary result)   Collection Time: 10/24/21 11:56 PM   Specimen: BLOOD  Result Value Ref Range Status   Specimen Description   Final    BLOOD Performed at [facility], [address] [place], [place] [address]    Special Requests   Final    NONE Performed at [facility], [address] [place], [place] [address]    Culture   Final    NO GROWTH 3 DAYS Performed at [facility], [place] [address] [place], [place] [address]    Report Status PENDING  Incomplete  Blood Culture (routine x 2)     Status: None (Preliminary result)   Collection Time: 10/25/21  1:29 AM   Specimen: BLOOD  Result Value Ref Range Status   Specimen Description   Final    BLOOD Performed at [facility], [address], [place], [place] [address]    Special Requests   Final    NONE Performed at [facility], [address] [place], [place] [address]    Culture   Final    NO GROWTH 3 DAYS Performed at [facility], [place] [address] [place],  [address]    Report Status PENDING  Incomplete     Labs: Basic Metabolic Panel: Recent Labs  Lab 10/24/21 1800 10/26/21 0704 10/27/21 0808  NA 127* 127* 133*  K 4.1 4.7 4.9  CL 97* 95* 97*  CO2 19* 21* 27  GLUCOSE 96 145* 136*  BUN 14 16 26*  CREATININE 1.24* 0.83 1.23*  CALCIUM 8.7* 9.0 9.5   Liver Function Tests: Recent Labs  Lab 10/24/21 1800 10/26/21 0704 10/27/21 0808  AST 34 51* 35  ALT 33 41 36  ALKPHOS 71 79 71  BILITOT 0.4 0.6 0.7  PROT 6.8 7.7 7.1  ALBUMIN 3.7 3.5 3.2*   CBC: Recent Labs  Lab 10/24/21 1800 10/26/21 0704 10/27/21 0808  WBC 3.3* 3.1* 6.6  NEUTROABS 2.2 2.5 5.5  HGB 11.1* 13.7 12.7  HCT 33.1* 42.4 39.0  MCV 92.5 95.7  94.2  PLT 145* 161 223   CBG: No results for input(s): "GLUCAP" in the last 168 hours. Hgb A1c No results for input(s): "HGBA1C" in the last 72 hours. Lipid Profile  No results for input(s): "CHOL", "HDL", "LDLCALC", "TRIG", "CHOLHDL", "LDLDIRECT" in the last 72 hours. Thyroid function studies No results for input(s): "TSH", "T4TOTAL", "T3FREE", "THYROIDAB" in the last 72 hours.  Invalid input(s): "FREET3" Urinalysis    Component Value Date/Time   COLORURINE YELLOW 04/23/2021 1600   APPEARANCEUR CLEAR 04/23/2021 1600   LABSPEC 1.009 04/23/2021 1600   PHURINE 7.0 04/23/2021 1600   GLUCOSEU NEGATIVE 04/23/2021 1600   HGBUR NEGATIVE 04/23/2021 1600   BILIRUBINUR NEGATIVE 04/23/2021 1600   KETONESUR NEGATIVE 04/23/2021 1600   PROTEINUR NEGATIVE 04/23/2021 1600   NITRITE NEGATIVE 04/23/2021 1600   LEUKOCYTESUR NEGATIVE 04/23/2021 1600    FURTHER DISCHARGE INSTRUCTIONS:   Get Medicines reviewed and adjusted: Please take all your medications with you for your next visit with your Primary MD   Laboratory/radiological data: Please request your Primary MD to go over all hospital tests and procedure/radiological results at the follow up, please ask your Primary MD to get all Hospital records sent to his/her office.   In some cases, they will be blood work, cultures and biopsy results pending at the time of your discharge. Please request that your primary care M.D. goes through all the records of your hospital data and follows up on these results.   Also Note the following: If you experience worsening of your admission symptoms, develop shortness of breath, life threatening emergency, suicidal or homicidal thoughts you must seek medical attention immediately by calling 911 or calling your MD immediately  if symptoms less severe.   You must read complete instructions/literature along with all the possible adverse reactions/side effects for all the Medicines you take and that have been  prescribed to you. Take any new Medicines after you have completely understood and accpet all the possible adverse reactions/side effects.    Do not drive when taking Pain medications or sleeping medications (Benzodaizepines)   Do not take more than prescribed Pain, Sleep and Anxiety Medications. It is not advisable to combine anxiety,sleep and pain medications without talking with your primary care practitioner   Special Instructions: If you have smoked or chewed Tobacco  in the last 2 yrs please stop smoking, stop any regular Alcohol  and or any Recreational drug use.   Wear Seat belts while driving.   Please note: You were cared for by a hospitalist during your hospital stay. Once you are discharged, your primary care physician will handle any further medical issues. Please note that NO REFILLS for any discharge medications will be authorized once you are discharged, as it is imperative that you return to your primary care physician (or establish a relationship with a primary care physician if you do not have one) for your post hospital discharge needs so that they can reassess your need for medications and monitor your lab values.  Time coordinating discharge: 40 minutes  SIGNED:  Marzetta Board, MD, PhD 10/28/2021, 9:01 AM

## 2021-10-28 NOTE — TOC Transition Note (Signed)
Transition of Care [facility]) - CM/SW Discharge Note   Patient Details  Name: Sheila Morales MRN: [id number] Date of Birth: December 28, 1950  Transition of [facility]) CM/SW Contact:  Ross Ludwig, LCSW Phone Number: 10/28/2021, 10:50 AM   Clinical Narrative:    Patient is open to [facility] for [facility] PT.  CSW confirmed with Amy that they will continue to see patient.  CSW was informed patient will need oxygen, CSW contacted [employer], they will deliver oxygen for patient.  CSW signing off, please reconsult if other social work needs arise.   Final next level of care: [place] Barriers to Discharge: Barriers Resolved   Patient Goals and CMS Choice Patient states their goals for this hospitalization and ongoing recovery are:: To return back home. CMS Medicare.gov Compare Post Acute Care list provided to:: Patient Choice offered to / list presented to : Patient  Discharge Placement                       Discharge Plan and Services                DME Arranged: Oxygen DME Agency: [facility] Date DME Agency Contacted: 10/28/21 Time DME Agency Contacted: 21 Representative spoke with at DME Agency: Dennard Schaumann Arranged: PT [place]: [place] Date [place]: 10/28/21 Time [place]: 32 Representative spoke with at [place]: Amy  Social Determinants of Health ([place]) Interventions     Readmission Risk Interventions     No data to display

## 2021-10-28 NOTE — Progress Notes (Signed)
SATURATION QUALIFICATIONS: (This note is used to comply with regulatory documentation for home oxygen)  Patient Saturations on Room Air at Rest = 94%  Patient Saturations on Room Air while Ambulating = 80%  Patient Saturations on 2 Liters of oxygen while Ambulating = 97%  Please briefly explain why patient needs home oxygen:  Resting o2 saturation on 2 LNC is 97%, Room Air saturation drops to 94%. Patient ambulated on Room Air and saturation drops to 80%. Place patient on [facility] while ambulating and returned to 94%.

## 2021-10-30 ENCOUNTER — Telehealth: Payer: Self-pay

## 2021-10-30 LAB — CULTURE, BLOOD (ROUTINE X 2)
Culture: NO GROWTH
Culture: NO GROWTH

## 2021-10-30 NOTE — Telephone Encounter (Signed)
Transition Care Management Follow-up Telephone Call Date of discharge and from where: 10/28/2021 from [facility]  Dx: J96.01 Acute Resp Failure with Hypoxia How have you been since you were released from the hospital? "Doing fine" Any questions or concerns? No  Items Reviewed: Did the pt receive and understand the discharge instructions provided? Yes  Medications obtained and verified? Yes  Other? No  Any new allergies since your discharge? No  Dietary orders reviewed? No Do you have support at home? Yes   Home Care and Equipment/Supplies: Were home health services ordered? yes If so, what is the name of the agency? [facility]  Has the agency set up a time to come to the patient's home? Patient refused services Were any new equipment or medical supplies ordered?  No What is the name of the medical supply agency? no Were you able to get the supplies/equipment? not applicable Do you have any questions related to the use of the equipment or supplies? No  Functional Questionnaire: (I = Independent and D = Dependent) ADLs: I  Bathing/Dressing- I  Meal Prep- I  Eating- I  Maintaining continence- I  Transferring/Ambulation- I  Managing Meds- I  Follow up appointments reviewed:  PCP Hospital f/u appt confirmed? Yes  Scheduled to see Pricilla Holm, MD on 11/02/2021 @ 2:40 pm. [facility] f/u appt confirmed? No   Are transportation arrangements needed? No  If their condition worsens, is the pt aware to call PCP or go to the Emergency Dept.? Yes Was the patient provided with contact information for the PCP's office or ED? Yes Was to pt encouraged to call back with questions or concerns? Yes

## 2021-11-01 ENCOUNTER — Other Ambulatory Visit: Payer: Self-pay | Admitting: Internal Medicine

## 2021-11-02 ENCOUNTER — Ambulatory Visit (INDEPENDENT_AMBULATORY_CARE_PROVIDER_SITE_OTHER): Payer: PPO | Admitting: Internal Medicine

## 2021-11-02 ENCOUNTER — Encounter: Payer: Self-pay | Admitting: Internal Medicine

## 2021-11-02 VITALS — BP 124/74 | HR 67 | Resp 18 | Ht 62.0 in | Wt 131.0 lb

## 2021-11-02 DIAGNOSIS — U071 COVID-19: Secondary | ICD-10-CM

## 2021-11-02 DIAGNOSIS — E871 Hypo-osmolality and hyponatremia: Secondary | ICD-10-CM | POA: Diagnosis not present

## 2021-11-02 DIAGNOSIS — J849 Interstitial pulmonary disease, unspecified: Secondary | ICD-10-CM | POA: Diagnosis not present

## 2021-11-02 NOTE — Assessment & Plan Note (Signed)
Checking CBC and CMP in about 3-4 weeks.

## 2021-11-02 NOTE — Patient Instructions (Signed)
We will order the labs to get done in 3-4 weeks.

## 2021-11-02 NOTE — Assessment & Plan Note (Signed)
She has finished antiviral and has 1 more day of steroids. Not using oxygen and O2 levels staying good.

## 2021-11-02 NOTE — Progress Notes (Signed)
   Subjective:   Patient ID: Sheila Morales, female    DOB: 11-27-1950, 71 y.o.   MRN: [id number]  HPI The patient is a 71 YO female coming in for hospital follow up.   Review of Systems  Constitutional:  Positive for activity change and fatigue.  HENT: Negative.    Eyes: Negative.   Respiratory:  Negative for cough, chest tightness and shortness of breath.   Cardiovascular:  Negative for chest pain, palpitations and leg swelling.  Gastrointestinal:  Negative for abdominal distention, abdominal pain, constipation, diarrhea, nausea and vomiting.  Musculoskeletal: Negative.   Skin: Negative.   Neurological: Negative.   Psychiatric/Behavioral: Negative.      Objective:  Physical Exam Constitutional:      Appearance: She is well-developed.  HENT:     Head: Normocephalic and atraumatic.  Cardiovascular:     Rate and Rhythm: Normal rate and regular rhythm.  Pulmonary:     Effort: Pulmonary effort is normal. No respiratory distress.     Breath sounds: Normal breath sounds. No wheezing or rales.  Abdominal:     General: Bowel sounds are normal. There is no distension.     Palpations: Abdomen is soft.     Tenderness: There is no abdominal tenderness. There is no rebound.  Musculoskeletal:     Cervical back: Normal range of motion.  Skin:    General: Skin is warm and dry.  Neurological:     Mental Status: She is alert and oriented to person, place, and time.     Coordination: Coordination normal.     Vitals:   11/02/21 1437  BP: 124/74  Pulse: 67  Resp: 18  SpO2: 95%  Weight: 131 lb (59.4 kg)  Height: '5\' 2"'$  (1.575 m)    Assessment & Plan:  Visit time 25 minutes in face to face communication with patient and coordination of care, additional 10 minutes spent in record review, coordination or care, ordering tests, communicating/referring to other healthcare professionals, documenting in medical records all on the same day of the visit for total time 35 minutes spent on the  visit.

## 2021-11-08 ENCOUNTER — Other Ambulatory Visit: Payer: Self-pay | Admitting: Internal Medicine

## 2021-11-12 ENCOUNTER — Telehealth: Payer: Self-pay | Admitting: Internal Medicine

## 2021-11-12 ENCOUNTER — Other Ambulatory Visit: Payer: Self-pay

## 2021-11-12 MED ORDER — METOPROLOL TARTRATE 25 MG PO TABS: 25.0000 mg | ORAL_TABLET | Freq: Two times a day (BID) | ORAL | 3 refills | Status: DC

## 2021-11-12 NOTE — Telephone Encounter (Signed)
Patient asked for refill on metoprolol tartrate '25mg'$  twice daily. Refill sent to patient's preferred pharmacy.

## 2021-11-12 NOTE — Telephone Encounter (Signed)
Pt c/o medication issue:  1. Name of Medication: metoprolol tartrate (LOPRESSOR) 25 MG tablet (Expired)  2. How are you currently taking this medication (dosage and times per day)? Take 1 tablet (25 mg total) by mouth 2 (two) times daily.  3. Are you having a reaction (difficulty breathing--STAT)? No   4. What is your medication issue? Patient is out of medication and needs a new 90 prescription, 6 day refills sent to  [id number] [place], [place]

## 2021-11-13 DIAGNOSIS — M329 Systemic lupus erythematosus, unspecified: Secondary | ICD-10-CM | POA: Diagnosis not present

## 2021-11-16 ENCOUNTER — Encounter (INDEPENDENT_AMBULATORY_CARE_PROVIDER_SITE_OTHER): Payer: PPO | Admitting: Ophthalmology

## 2021-11-16 DIAGNOSIS — H35033 Hypertensive retinopathy, bilateral: Secondary | ICD-10-CM | POA: Diagnosis not present

## 2021-11-16 DIAGNOSIS — H353111 Nonexudative age-related macular degeneration, right eye, early dry stage: Secondary | ICD-10-CM | POA: Diagnosis not present

## 2021-11-16 DIAGNOSIS — Z79899 Other long term (current) drug therapy: Secondary | ICD-10-CM | POA: Diagnosis not present

## 2021-11-16 DIAGNOSIS — I10 Essential (primary) hypertension: Secondary | ICD-10-CM

## 2021-11-16 DIAGNOSIS — M329 Systemic lupus erythematosus, unspecified: Secondary | ICD-10-CM | POA: Diagnosis not present

## 2021-11-16 DIAGNOSIS — H43813 Vitreous degeneration, bilateral: Secondary | ICD-10-CM

## 2021-11-23 ENCOUNTER — Ambulatory Visit (HOSPITAL_COMMUNITY)
Admission: RE | Admit: 2021-11-23 | Discharge: 2021-11-23 | Disposition: A | Discharge status: Home / Self Care | Payer: PPO | Source: Ambulatory Visit | Attending: Neurology | Admitting: Neurology

## 2021-11-23 DIAGNOSIS — M4803 Spinal stenosis, cervicothoracic region: Secondary | ICD-10-CM | POA: Diagnosis not present

## 2021-11-23 DIAGNOSIS — R292 Abnormal reflex: Secondary | ICD-10-CM | POA: Insufficient documentation

## 2021-11-23 DIAGNOSIS — M4802 Spinal stenosis, cervical region: Secondary | ICD-10-CM | POA: Diagnosis not present

## 2021-11-23 DIAGNOSIS — R296 Repeated falls: Secondary | ICD-10-CM | POA: Diagnosis not present

## 2021-11-23 DIAGNOSIS — M47812 Spondylosis without myelopathy or radiculopathy, cervical region: Secondary | ICD-10-CM | POA: Diagnosis not present

## 2021-11-23 NOTE — Progress Notes (Signed)
Patient here today at [facility] for MRI cervical wo contrast. Patient has Biotronik device. Nick-rep at the bedside to program patient. Auto-detection on, DOO per Renee-cardiology PA.

## 2021-11-26 ENCOUNTER — Ambulatory Visit (INDEPENDENT_AMBULATORY_CARE_PROVIDER_SITE_OTHER): Payer: PPO

## 2021-11-26 DIAGNOSIS — I495 Sick sinus syndrome: Secondary | ICD-10-CM | POA: Diagnosis not present

## 2021-11-26 NOTE — Progress Notes (Signed)
LMOVm for patient to call us back.

## 2021-11-27 ENCOUNTER — Telehealth: Payer: Self-pay

## 2021-11-27 ENCOUNTER — Ambulatory Visit: Payer: PPO | Admitting: Emergency Medicine

## 2021-11-27 ENCOUNTER — Encounter: Payer: Self-pay | Admitting: Emergency Medicine

## 2021-11-27 ENCOUNTER — Telehealth: Payer: Self-pay | Admitting: Neurology

## 2021-11-27 ENCOUNTER — Ambulatory Visit (INDEPENDENT_AMBULATORY_CARE_PROVIDER_SITE_OTHER): Payer: PPO

## 2021-11-27 VITALS — BP 128/70 | HR 66 | Temp 97.8°F | Ht 62.0 in | Wt 128.0 lb

## 2021-11-27 DIAGNOSIS — R918 Other nonspecific abnormal finding of lung field: Secondary | ICD-10-CM | POA: Diagnosis not present

## 2021-11-27 DIAGNOSIS — R059 Cough, unspecified: Secondary | ICD-10-CM | POA: Diagnosis not present

## 2021-11-27 DIAGNOSIS — M4802 Spinal stenosis, cervical region: Secondary | ICD-10-CM

## 2021-11-27 DIAGNOSIS — R052 Subacute cough: Secondary | ICD-10-CM

## 2021-11-27 LAB — CUP PACEART REMOTE DEVICE CHECK
Date Time Interrogation Session: 20230808071708
Implantable Lead Implant Date: 20220204
Implantable Lead Implant Date: 20220204
Implantable Lead Location: 753859
Implantable Lead Location: 753860
Implantable Lead Model: 377169
Implantable Lead Model: 377171
Implantable Lead Serial Number: 8000143637
Implantable Lead Serial Number: 8000151447
Implantable Pulse Generator Implant Date: 20220204
Pulse Gen Model: 407145
Pulse Gen Serial Number: 70020732

## 2021-11-27 LAB — CBC WITH DIFFERENTIAL/PLATELET
Basophils Absolute: 0.1 10*3/uL (ref 0.0–0.1)
Basophils Relative: 1.3 % (ref 0.0–3.0)
Eosinophils Absolute: 0 10*3/uL (ref 0.0–0.7)
Eosinophils Relative: 0.3 % (ref 0.0–5.0)
HCT: 36.1 % (ref 36.0–46.0)
Hemoglobin: 12.1 g/dL (ref 12.0–15.0)
Lymphocytes Relative: 20.2 % (ref 12.0–46.0)
Lymphs Abs: 1 10*3/uL (ref 0.7–4.0)
MCHC: 33.5 g/dL (ref 30.0–36.0)
MCV: 92.8 fl (ref 78.0–100.0)
Monocytes Absolute: 0.9 10*3/uL (ref 0.1–1.0)
Monocytes Relative: 17.5 % — ABNORMAL HIGH (ref 3.0–12.0)
Neutro Abs: 3.1 10*3/uL (ref 1.4–7.7)
Neutrophils Relative %: 60.7 % (ref 43.0–77.0)
Platelets: 255 10*3/uL (ref 150.0–400.0)
RBC: 3.89 Mil/uL (ref 3.87–5.11)
RDW: 15.7 % — ABNORMAL HIGH (ref 11.5–15.5)
WBC: 5.1 10*3/uL (ref 4.0–10.5)

## 2021-11-27 MED ORDER — PREDNISONE 10 MG PO TABS: 20.0000 mg | ORAL_TABLET | Freq: Every day | ORAL | 0 refills | Status: AC

## 2021-11-27 NOTE — Assessment & Plan Note (Addendum)
Flaring cough for about 6 days in a complicated patient with a history of interstitial lung disease, COPD.  She has been on methotrexate, is now off hydroxychloroquine, is also on amiodarone.  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  Responded quickly to prednisone in the recent past.  Most recently with superimposed COVID-19 and associated symptoms.  Her current cough could be simple upper airway irritation but need to consider progressive interstitial lung disease.  Chest x-ray today.  Depending on how this looks and how well she responds to short course of prednisone will need to consider whether she needs a repeat CT scan of the chest.  Will check eosinophil count today prior to prednisone course.   We will check a chest x-ray and lab work today. Please continue your Anoro once daily. Keep albuterol available to use 2 puffs to be needed for shortness of breath, chest tightness, wheezing. Take prednisone 20 mg daily for 7 days and then stop. Temporarily start taking your fluticasone nasal spray, 2 sprays each nostril once daily every day on a schedule.  After 2 weeks you go back to using it as needed. Follow with Dr. Silas Flood as planned.  Depending on your response to the prednisone, your chest x-ray and eosinophil count, we will determine next steps.

## 2021-11-27 NOTE — Patient Instructions (Signed)
We will check a chest x-ray and lab work today. Please continue your Anoro once daily. Keep albuterol available to use 2 puffs to be needed for shortness of breath, chest tightness, wheezing. Take prednisone 20 mg daily for 7 days and then stop. Temporarily start taking your fluticasone nasal spray, 2 sprays each nostril once daily every day on a schedule.  After 2 weeks you go back to using it as needed. Follow with Dr. Silas Flood as planned.  Depending on your response to the prednisone, your chest x-ray and eosinophil count, we will determine next steps.

## 2021-11-27 NOTE — Telephone Encounter (Signed)
Called patient back and went over the results. Referral sent by [facility] this morning

## 2021-11-27 NOTE — Progress Notes (Signed)
Subjective:    Patient ID: Sheila Morales, female    DOB: May 10, 1950, 71 y.o.   MRN: [id number]  HPI Ms. Gasca is 26, former smoker with COPD.  She also has SLE and inflammatory bowel disease that is been treated with methotrexate, now off hydroxychlorquine. Also on amiodarone.  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  She has been seeing Dr. Silas Flood.  She unfortunately had COVID-19 in July. Has also been dealing with hyponatremia, some dysequilibrium with neurology. She has completed prednisone from the hospitalization. Her breathing has felt better, cough had been under control until about 6 days ago, non-productive. Has tried delsym. No real change in SOB, some stable wheeze at night.    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      Family History  Problem Relation Age of Onset   Stroke Mother        Deceased, 33s   Atrial fibrillation Father    Colon cancer Father    Dementia Sister    Other Sister        Pick's disease, deceased   Healthy Daughter      Social History   Socioeconomic History   Marital status: Married    Spouse name: Not on file   Number of children: 2   Years of education: 12   Highest education level: Not on file  Occupational  History   Occupation: Education administrator business   Occupation: caregiver  Tobacco Use   Smoking status: Former    Packs/day: 0.25    Years: 30.00    Total pack years: 7.50    Types: Cigarettes    Quit date: 01/21/2020    Years since quitting: 1.8   Smokeless tobacco: Never   Tobacco comments:    smokes "every now and then" 08/24/20 [facility]   Vaping Use   Vaping Use: Never used  Substance and Sexual Activity   Alcohol use: Yes    Alcohol/week: 5.0 standard drinks of alcohol    Types: 5 Standard drinks or equivalent per week    Comment: 4 out of 7 dyas per husband at bedside   Drug use: No   Sexual activity: Yes  Other Topics Concern   Not on file  Social History Narrative   HSG. Married '71. 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       One level home with spouse   Right handed   Caffeine - coffee 2-3 cups am   Exercise - treadmill    Education - 12th grade   Social Determinants of Health   Financial Resource Strain: Low Risk  (09/21/2021)   Overall Financial Resource Strain (  CARDIA)    Difficulty of Paying Living Expenses: Not hard at all  Food Insecurity: No Food Insecurity (09/21/2021)   Hunger Vital Sign    Worried About Running Out of Food in the Last Year: Never true    Ran Out of Food in the Last Year: Never true  Transportation Needs: No Transportation Needs (09/21/2021)   PRAPARE - Hydrologist (Medical): No    Lack of Transportation (Non-Medical): No  Physical Activity: Inactive (09/21/2021)   Exercise Vital Sign    Days of Exercise per Week: 0 days    Minutes of Exercise per Session: 0 min  Stress: No Stress Concern Present (09/21/2021)   [place]    Feeling of Stress : Not at all  Social Connections: Moderately Integrated (09/21/2021)   Social Connection and Isolation Panel [NHANES]    Frequency  of Communication with Friends and Family: Three times a week    Frequency of Social Gatherings with Friends and Family: Three times a week    Attends Religious Services: 1 to 4 times per year    Active Member of Clubs or Organizations: No    Attends Archivist Meetings: Never    Marital Status: Married  Human resources officer Violence: Not At Risk (09/21/2021)   Humiliation, Afraid, Rape, and Kick questionnaire    Fear of Current or Ex-Partner: No    Emotionally Abused: No    Physically Abused: No    Sexually Abused: No     No Known Allergies   Outpatient Medications Prior to Visit  Medication Sig Dispense Refill   albuterol (VENTOLIN HFA) 108 (90 Base) MCG/ACT inhaler Inhale 1-2 puffs into the lungs every 6 (six) hours as needed for wheezing or shortness of breath. 8 g 5   amiodarone (PACERONE) 200 MG tablet Take 1 tablet (200 mg total) by mouth daily. 90 tablet 3   apixaban (ELIQUIS) 5 MG TABS tablet Take 1 tablet (5 mg total) by mouth 2 (two) times daily. 180 tablet 0   benzonatate (TESSALON) 200 MG capsule Take 1 capsule (200 mg total) by mouth 3 (three) times daily as needed for cough. 30 capsule 1   calcium carbonate (OS-CAL - DOSED IN MG OF ELEMENTAL CALCIUM) 1250 (500 Ca) MG tablet Take 1 tablet by mouth every evening.     Cholecalciferol (VITAMIN D) 50 MCG (2000 UT) tablet Take 2,000 Units by mouth every evening.     empagliflozin (JARDIANCE) 10 MG TABS tablet Take 1 tablet (10 mg total) by mouth daily before breakfast. 30 tablet 5   fluticasone (FLONASE) 50 MCG/ACT nasal spray SPRAY 2 SPRAYS INTO EACH NOSTRIL EVERY DAY 48 mL 3   folic acid (FOLVITE) 1 MG tablet Take 1 mg by mouth every evening.     furosemide (LASIX) 20 MG tablet TAKE 1 TABLET (20 MG) BY MOUTH DAILY, TAKE 1 EXTRA TABLET BY MOUTH (20 MG) AS NEEDED FOR INCREASED SWELLING. 90 tablet 3   gabapentin (NEURONTIN) 300 MG capsule TAKE TWO CAPSULES BY MOUTH EVERY MORNING, TWO CAPSULES EACH AFTERNOON AND THREE CAPSULES  AT BEDTIME 630 capsule 0   hydroxychloroquine (PLAQUENIL) 200 MG tablet Take 200 mg by mouth daily.     methotrexate 2.5 MG tablet Take 10 mg by mouth every Thursday.     metoprolol tartrate (LOPRESSOR) 25 MG tablet Take 1 tablet (25 mg total) by mouth 2 (two) times daily. 180 tablet 3   Misc Natural Products (  ADVANCED JOINT RELIEF) CAPS Take 1 capsule by mouth daily. Joint Advantage Gold 5x     Multiple Vitamin (MULTIVITAMIN WITH MINERALS) TABS tablet Take 1 tablet by mouth daily.     RESTASIS 0.05 % ophthalmic emulsion Place 1 drop into both eyes 2 (two) times daily.  6   triamcinolone cream (KENALOG) 0.1 % Apply 1 application. topically 2 (two) times daily. (Patient taking differently: Apply 1 application  topically as needed.) 30 g 0   umeclidinium-vilanterol (ANORO ELLIPTA) 62.5-25 MCG/ACT AEPB INHALE 1 PUFF BY MOUTH EVERY DAY 60 each 5   vitamin C (ASCORBIC ACID) 500 MG tablet Take 500 mg by mouth daily.     vitamin E 180 MG (400 UNITS) capsule Take 400 Units by mouth daily.     zolpidem (AMBIEN) 5 MG tablet TAKE 1 TABLET BY MOUTH EVERY DAY AT BEDTIME AS NEEDED FOR SLEEP 30 tablet 5   No facility-administered medications prior to visit.         Objective:   Physical Exam Vitals:   11/27/21 1117  BP: 128/70  Pulse: 66  Temp: 97.8 F (36.6 C)  TempSrc: Oral  SpO2: 95%  Weight: 128 lb (58.1 kg)  Height: '5\' 2"'$  (1.575 m)   Gen: Pleasant, well-nourished, in no distress,  normal affect  ENT: No lesions,  mouth clear,  oropharynx clear, no postnasal drip  Neck: No JVD, no stridor  Lungs: No use of accessory muscles, no crackles or wheezing on normal respiration, no wheeze on forced expiration  Cardiovascular: RRR, heart sounds normal, no murmur or gallops, no peripheral edema  Musculoskeletal: No deformities, no cyanosis or clubbing  Neuro: alert, awake, non focal  Skin: Warm, cheeks are a bit flushed       Assessment & Plan:  Cough Flaring cough for about 6 days  and a complicated patient with a history of interstitial lung disease.  She has been on methotrexate, now off hydroxychloroquine, is also on amiodarone.  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  Responded quickly to prednisone in the recent past.  Most recently with superimposed COVID-19 and associated symptoms.  Her current cough could be simple upper airway irritation but need to consider progressive interstitial lung disease.  Chest x-ray today.  Depending on how this looks and how well she responds to short course of prednisone will need to consider whether she needs a repeat CT scan of the chest.  Will check eosinophil count today prior to prednisone course.   We will check a chest x-ray and lab work today. Please continue your Anoro once daily. Keep albuterol available to use 2 puffs to be needed for shortness of breath, chest tightness, wheezing. Take prednisone 20 mg daily for 7 days and then stop. Temporarily start taking your fluticasone nasal spray, 2 sprays each nostril once daily every day on a schedule.  After 2 weeks you go back to using it as needed. Follow with Dr. Silas Flood as planned.  Depending on your response to the prednisone, your chest x-ray and eosinophil count, we will determine next steps.   Baltazar Apo, MD, PhD 11/27/2021, 11:49 AM [facility] [phone] or if no answer before 7:00PM call [phone] For any issues after 7:00PM please call eLink [phone]

## 2021-11-27 NOTE — Telephone Encounter (Signed)
-----   Message from Cameron Sprang, MD sent at 11/26/2021 10:22 AM EDT ----- Pls let her know Dr. Tomi Likens is out of the office and back on Monday. Her MRI cervical spine showed spinal stenosis, which can cause frequent falls. Would refer to Neurosurgery, if they are agreeable, pls send referral with MRI cervical spine report. Thanks

## 2021-11-27 NOTE — Telephone Encounter (Signed)
Pt called in returning a call about MRI results

## 2021-11-27 NOTE — Telephone Encounter (Signed)
Patient advised of results. Referral sent to [facility].

## 2021-12-03 ENCOUNTER — Encounter: Payer: Self-pay | Admitting: Internal Medicine

## 2021-12-03 DIAGNOSIS — J849 Interstitial pulmonary disease, unspecified: Secondary | ICD-10-CM | POA: Diagnosis not present

## 2021-12-03 DIAGNOSIS — U071 COVID-19: Secondary | ICD-10-CM | POA: Diagnosis not present

## 2021-12-05 ENCOUNTER — Inpatient Hospital Stay: Payer: PPO | Admitting: Internal Medicine

## 2021-12-06 ENCOUNTER — Encounter: Payer: Self-pay | Admitting: Neurology

## 2021-12-10 ENCOUNTER — Telehealth: Payer: Self-pay | Admitting: Internal Medicine

## 2021-12-10 NOTE — Telephone Encounter (Signed)
Pt has questions about bloodwork, she only wants to speak with a nurse, so I was unable to help her.  Sheila Morales: [phone]

## 2021-12-11 ENCOUNTER — Inpatient Hospital Stay: Payer: PPO | Admitting: Internal Medicine

## 2021-12-11 NOTE — Telephone Encounter (Signed)
LVM for pt to call the clinic with her lab questions.

## 2021-12-12 NOTE — Telephone Encounter (Signed)
Pt called back and I was able to make her a lab appt for 8/25. Labs are already ordered.  FYI

## 2021-12-14 ENCOUNTER — Other Ambulatory Visit (INDEPENDENT_AMBULATORY_CARE_PROVIDER_SITE_OTHER): Payer: PPO

## 2021-12-14 DIAGNOSIS — E871 Hypo-osmolality and hyponatremia: Secondary | ICD-10-CM | POA: Diagnosis not present

## 2021-12-14 LAB — CBC
HCT: 38.5 % (ref 36.0–46.0)
Hemoglobin: 12.7 g/dL (ref 12.0–15.0)
MCHC: 33 g/dL (ref 30.0–36.0)
MCV: 93.3 fl (ref 78.0–100.0)
Platelets: 195 10*3/uL (ref 150.0–400.0)
RBC: 4.12 Mil/uL (ref 3.87–5.11)
RDW: 16.7 % — ABNORMAL HIGH (ref 11.5–15.5)
WBC: 6.2 10*3/uL (ref 4.0–10.5)

## 2021-12-14 LAB — COMPREHENSIVE METABOLIC PANEL
ALT: 50 U/L — ABNORMAL HIGH (ref 0–35)
AST: 41 U/L — ABNORMAL HIGH (ref 0–37)
Albumin: 3.8 g/dL (ref 3.5–5.2)
Alkaline Phosphatase: 87 U/L (ref 39–117)
BUN: 19 mg/dL (ref 6–23)
CO2: 28 mEq/L (ref 19–32)
Calcium: 9.4 mg/dL (ref 8.4–10.5)
Chloride: 95 mEq/L — ABNORMAL LOW (ref 96–112)
Creatinine, Ser: 1.28 mg/dL — ABNORMAL HIGH (ref 0.40–1.20)
GFR: 41.97 mL/min — ABNORMAL LOW (ref >60.00)
Glucose, Bld: 73 mg/dL (ref 70–99)
Potassium: 4.5 mEq/L (ref 3.5–5.1)
Sodium: 131 mEq/L — ABNORMAL LOW (ref 135–145)
Total Bilirubin: 0.5 mg/dL (ref 0.2–1.2)
Total Protein: 7.3 g/dL (ref 6.0–8.3)

## 2021-12-18 ENCOUNTER — Encounter: Payer: Self-pay | Admitting: Internal Medicine

## 2021-12-18 ENCOUNTER — Ambulatory Visit (INDEPENDENT_AMBULATORY_CARE_PROVIDER_SITE_OTHER): Payer: PPO | Admitting: Internal Medicine

## 2021-12-18 VITALS — BP 120/70 | HR 71 | Temp 98.3°F | Ht 62.0 in | Wt 126.0 lb

## 2021-12-18 DIAGNOSIS — R0683 Snoring: Secondary | ICD-10-CM | POA: Diagnosis not present

## 2021-12-18 DIAGNOSIS — G609 Hereditary and idiopathic neuropathy, unspecified: Secondary | ICD-10-CM | POA: Diagnosis not present

## 2021-12-18 DIAGNOSIS — E871 Hypo-osmolality and hyponatremia: Secondary | ICD-10-CM

## 2021-12-18 DIAGNOSIS — I5032 Chronic diastolic (congestive) heart failure: Secondary | ICD-10-CM

## 2021-12-18 NOTE — Assessment & Plan Note (Signed)
She has persistent daytime fatigue and can fall asleep spontaneously. Ordered home sleep test given snoring reported by spouse.

## 2021-12-18 NOTE — Assessment & Plan Note (Signed)
No flare today. Advised to reduce lasix to 20 mg every other day and monitor weights carefully. If increase return to daily dosing.

## 2021-12-18 NOTE — Progress Notes (Signed)
   Subjective:   Patient ID: Sheila Morales, female    DOB: 09/14/1950, 71 y.o.   MRN: [id number]  HPI The patient is a 71 YO female coming in for ongoing concerns.  Review of Systems  Constitutional:  Positive for activity change and fatigue.  HENT: Negative.    Eyes: Negative.   Respiratory:  Positive for shortness of breath. Negative for cough and chest tightness.   Cardiovascular:  Negative for chest pain, palpitations and leg swelling.  Gastrointestinal:  Negative for abdominal distention, abdominal pain, constipation, diarrhea, nausea and vomiting.  Musculoskeletal:  Positive for arthralgias and gait problem.  Skin: Negative.   Neurological:  Positive for weakness.  Psychiatric/Behavioral: Negative.      Objective:  Physical Exam Constitutional:      Appearance: She is well-developed. She is ill-appearing.  HENT:     Head: Normocephalic and atraumatic.  Cardiovascular:     Rate and Rhythm: Normal rate and regular rhythm.  Pulmonary:     Effort: Pulmonary effort is normal. No respiratory distress.     Breath sounds: Normal breath sounds. No wheezing or rales.  Abdominal:     General: Bowel sounds are normal. There is no distension.     Palpations: Abdomen is soft.     Tenderness: There is no abdominal tenderness. There is no rebound.  Musculoskeletal:     Cervical back: Normal range of motion.  Skin:    General: Skin is warm and dry.  Neurological:     Mental Status: She is alert and oriented to person, place, and time.     Coordination: Coordination abnormal.     Comments: walker     Vitals:   12/18/21 0917  BP: 120/70  Pulse: 71  Temp: 98.3 F (36.8 C)  TempSrc: Oral  SpO2: 90%  Weight: 126 lb (57.2 kg)  Height: '5\' 2"'$  (1.575 m)    Assessment & Plan:

## 2021-12-18 NOTE — Assessment & Plan Note (Signed)
Due to drowsiness asked to reduce gabapentin to 300 mg in morning and afternoon and okay to keep 900 mg at bedtime. If improvement in drowsiness we can consider further dosage change depending on symptoms.

## 2021-12-18 NOTE — Assessment & Plan Note (Signed)
Overall labs stable recently. They are still taking 2 g sodium pills daily and limiting free water intake.

## 2021-12-18 NOTE — Patient Instructions (Addendum)
We will have you do lasix (furosemide) every other day and keep track of weight.  We will have you reduce the gabapentin to 1 in the morning, 1 in the afternoon, 3 in the evening.

## 2021-12-26 NOTE — Progress Notes (Signed)
Remote pacemaker transmission.   

## 2021-12-27 DIAGNOSIS — R29818 Other symptoms and signs involving the nervous system: Secondary | ICD-10-CM | POA: Diagnosis not present

## 2021-12-27 DIAGNOSIS — R260 Ataxic gait: Secondary | ICD-10-CM | POA: Diagnosis not present

## 2021-12-27 DIAGNOSIS — M542 Cervicalgia: Secondary | ICD-10-CM | POA: Diagnosis not present

## 2022-01-01 ENCOUNTER — Ambulatory Visit (HOSPITAL_COMMUNITY)
Admission: RE | Admit: 2022-01-01 | Discharge: 2022-01-01 | Disposition: A | Discharge status: Home / Self Care | Payer: PPO | Source: Ambulatory Visit | Attending: Cardiology | Admitting: Cardiology

## 2022-01-01 VITALS — BP 108/64 | HR 66 | Wt 128.8 lb

## 2022-01-01 DIAGNOSIS — I110 Hypertensive heart disease with heart failure: Secondary | ICD-10-CM | POA: Insufficient documentation

## 2022-01-01 DIAGNOSIS — J8489 Other specified interstitial pulmonary diseases: Secondary | ICD-10-CM | POA: Insufficient documentation

## 2022-01-01 DIAGNOSIS — L932 Other local lupus erythematosus: Secondary | ICD-10-CM

## 2022-01-01 DIAGNOSIS — I495 Sick sinus syndrome: Secondary | ICD-10-CM

## 2022-01-01 DIAGNOSIS — J449 Chronic obstructive pulmonary disease, unspecified: Secondary | ICD-10-CM

## 2022-01-01 DIAGNOSIS — I10 Essential (primary) hypertension: Secondary | ICD-10-CM

## 2022-01-01 DIAGNOSIS — M329 Systemic lupus erythematosus, unspecified: Secondary | ICD-10-CM | POA: Insufficient documentation

## 2022-01-01 DIAGNOSIS — Z79899 Other long term (current) drug therapy: Secondary | ICD-10-CM | POA: Diagnosis not present

## 2022-01-01 DIAGNOSIS — Z7901 Long term (current) use of anticoagulants: Secondary | ICD-10-CM | POA: Insufficient documentation

## 2022-01-01 DIAGNOSIS — I5032 Chronic diastolic (congestive) heart failure: Secondary | ICD-10-CM | POA: Diagnosis not present

## 2022-01-01 DIAGNOSIS — Z950 Presence of cardiac pacemaker: Secondary | ICD-10-CM | POA: Insufficient documentation

## 2022-01-01 DIAGNOSIS — I4891 Unspecified atrial fibrillation: Secondary | ICD-10-CM | POA: Insufficient documentation

## 2022-01-01 DIAGNOSIS — I480 Paroxysmal atrial fibrillation: Secondary | ICD-10-CM | POA: Diagnosis not present

## 2022-01-01 LAB — COMPREHENSIVE METABOLIC PANEL
ALT: 59 U/L — ABNORMAL HIGH (ref 0–44)
AST: 49 U/L — ABNORMAL HIGH (ref 15–41)
Albumin: 3.2 g/dL — ABNORMAL LOW (ref 3.5–5.0)
Alkaline Phosphatase: 74 U/L (ref 38–126)
Anion gap: 8 (ref 5–15)
BUN: 19 mg/dL (ref 8–23)
CO2: 25 mmol/L (ref 22–32)
Calcium: 9 mg/dL (ref 8.9–10.3)
Chloride: 98 mmol/L (ref 98–111)
Creatinine, Ser: 1.19 mg/dL — ABNORMAL HIGH (ref 0.44–1.00)
GFR, Estimated: 49 mL/min — ABNORMAL LOW (ref >60)
Glucose, Bld: 96 mg/dL (ref 70–99)
Potassium: 4.4 mmol/L (ref 3.5–5.1)
Sodium: 131 mmol/L — ABNORMAL LOW (ref 135–145)
Total Bilirubin: 0.7 mg/dL (ref 0.3–1.2)
Total Protein: 6.9 g/dL (ref 6.5–8.1)

## 2022-01-01 LAB — BRAIN NATRIURETIC PEPTIDE: B Natriuretic Peptide: 638.1 pg/mL — ABNORMAL HIGH (ref 0.0–100.0)

## 2022-01-01 NOTE — Patient Instructions (Signed)
There has been no changes to your medications.   Labs done today, your results will be available in MyChart, we will contact you for abnormal readings.  Your physician has requested that you have an echocardiogram. Echocardiography is a painless test that uses sound waves to create images of your heart. It provides your doctor with information about the size and shape of your heart and how well your heart's chambers and valves are working. This procedure takes approximately one hour. There are no restrictions for this procedure.  Your physician recommends that you schedule a follow-up appointment in: 3 months  If you have any questions or concerns before your next appointment please send us a message through mychart or call our office at [phone].    TO LEAVE A MESSAGE FOR THE NURSE SELECT OPTION 2, PLEASE LEAVE A MESSAGE INCLUDING: YOUR NAME DATE OF BIRTH CALL BACK NUMBER REASON FOR CALL**this is important as we prioritize the call backs  YOU WILL RECEIVE A CALL BACK THE SAME DAY AS LONG AS YOU CALL BEFORE 4:00 PM  At the [facility], you and your health needs are our priority. As part of our continuing mission to provide you with exceptional heart care, we have created designated Provider Care Teams. These Care Teams include your primary Cardiologist (physician) and Advanced Practice Providers (APPs- Physician Assistants and Nurse Practitioners) who all work together to provide you with the care you need, when you need it.   You may see any of the following providers on your designated Care Team at your next follow up: Dr Daniel Bensimhon Dr Dalton McLean Dr. Aditya Sabharwal Amy Clegg, NP Brittainy Simmons, PA Jessica Milford,NP Lindsay Finch, PA Alma Diaz, NP Lauren Kemp, PharmD   Please be sure to bring in all your medications bottles to every appointment.    

## 2022-01-01 NOTE — Progress Notes (Signed)
ADVANCED HEART FAILURE CLINIC NOTE  Referring Physician:Dr. [name] MD Primary Care: Sheila Holm, MD Primary Cardiologist: Dr. [name] MD  HPI: Sheila Morales is a very pleasant 71 y.o. female with COPD, SLE on methotrexate, hypertension, ILD, HFpEF, SSS s/p PPM who presents for initial visit for further evaluation and treatment of heart failure/cardiomyopathy. Sheila Morales medical history dates back to the 35s when she was originally diagnosed with cutaneous Lupus. She has been on hydroxychloroquine '400mg'$  daily since that time. Her dose was reduced to '200mg'$  daily in 2020 due to concern with hydroxychloroquine availability during COVID. In early 2022, she underwent placement of PPM for symptomatic bradycardia due to sinus node dysfunction.   She was also hospitalized in November 2022 for encephalopathy believed to be due to hyponatremia and E. Coli bacteremia. During that admission she was found to be in new onset atrial fibrillation and acute HFpEF exacerbation requiring BiPAP and ICU admission. Following this she was admitted in January 2023 with fever/confusion/lethargy. During that admission she was noted to be anemic to 7.3 (on eliquis). 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   Since that time she has been closely followed by Dr. Margaretann Morales with continued improvement in her functional status. From a heart failure standpoint, patient's husband believes her symptoms are relatively well controlled. He strongly believes that her current weakness and lack of exercise capacity is due to chronic deconditioning. In addition, she is no longer taking plaquenil. Currently on lasix '20mg'$  PO daily and salt tabs x 2 to manage volume status and hyponatremia. No admits in the past 1 month for heart failure.  Activity level/exercise tolerance:  Poor, limited due to deconditioning Orthopnea:  Sleeps on 1-2  pillows Paroxysmal noctural dyspnea:  No Chest pain/pressure:  No/minimal Orthostatic lightheadedness:  Minimal currently Palpitations:  No Lower extremity edema:  No Presyncope/syncope:  No  Past Medical History:  Diagnosis Date   Acute respiratory failure with hypoxia ([facility]) 11/05/2017   Arthritis    Colitis, ischemic (HCC)    COPD (chronic obstructive pulmonary disease) (HCC)    External hemorrhoids    H/O: rheumatic fever    IBS (irritable bowel syndrome)    Personal history colonic adenoma 03/25/2008   06/2007 right sided adenoma and 2 right hyperplastic polyps     Restless leg syndrome    questionable   Systemic lupus erythematosus (HCC)    Tubular adenoma of colon    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 8 g 5   amiodarone (PACERONE) 200 MG tablet Take 1 tablet (200 mg total) by mouth daily. 90 tablet 3   apixaban (ELIQUIS) 5 MG TABS tablet Take 1 tablet (5 mg total) by mouth 2 (two) times daily. 180 tablet 0   benzonatate (TESSALON) 200 MG capsule Take 1 capsule (200 mg total) by mouth 3 (three) times daily as needed for cough. 30 capsule 1   calcium carbonate (OS-CAL - DOSED IN MG OF ELEMENTAL CALCIUM) 1250 (500 Ca) MG tablet Take 1 tablet by mouth every evening.     Cholecalciferol (VITAMIN D) 50 MCG (2000 UT) tablet Take 2,000 Units by mouth every evening.     empagliflozin (JARDIANCE) 10 MG TABS  tablet Take 1 tablet (10 mg total) by mouth daily before breakfast. 30 tablet 5   fluticasone (FLONASE) 50 MCG/ACT nasal spray SPRAY 2 SPRAYS INTO EACH NOSTRIL EVERY DAY 48 mL 3   folic acid (FOLVITE) 1 MG tablet Take 1 mg by mouth every evening.     furosemide (LASIX) 20 MG tablet TAKE 1 TABLET (20 MG) BY MOUTH DAILY, TAKE 1 EXTRA TABLET BY MOUTH (20 MG) AS  NEEDED FOR INCREASED SWELLING. 90 tablet 3   gabapentin (NEURONTIN) 300 MG capsule TAKE TWO CAPSULES BY MOUTH EVERY MORNING, TWO CAPSULES EACH AFTERNOON AND THREE CAPSULES AT BEDTIME 630 capsule 0   hydroxychloroquine (PLAQUENIL) 200 MG tablet Take 200 mg by mouth daily.     methotrexate 2.5 MG tablet Take 10 mg by mouth every Thursday.     metoprolol tartrate (LOPRESSOR) 25 MG tablet Take 1 tablet (25 mg total) by mouth 2 (two) times daily. 180 tablet 3   Misc Natural Products (ADVANCED JOINT RELIEF) CAPS Take 1 capsule by mouth daily. Joint Advantage Gold 5x     Multiple Vitamin (MULTIVITAMIN WITH MINERALS) TABS tablet Take 1 tablet by mouth daily.     RESTASIS 0.05 % ophthalmic emulsion Place 1 drop into both eyes 2 (two) times daily.  6   triamcinolone cream (KENALOG) 0.1 % Apply 1 application. topically 2 (two) times daily. (Patient taking differently: Apply 1 application  topically as needed.) 30 g 0   umeclidinium-vilanterol (ANORO ELLIPTA) 62.5-25 MCG/ACT AEPB INHALE 1 PUFF BY MOUTH EVERY DAY 60 each 5   vitamin C (ASCORBIC ACID) 500 MG tablet Take 500 mg by mouth daily.     vitamin E 180 MG (400 UNITS) capsule Take 400 Units by mouth daily.     zolpidem (AMBIEN) 5 MG tablet TAKE 1 TABLET BY MOUTH EVERY DAY AT BEDTIME AS NEEDED FOR SLEEP 30 tablet 5   No current facility-administered medications for this visit.    No Known Allergies    Social History   Socioeconomic History   Marital status: Married    Spouse name: Not on file   Number of children: 2   Years of education: 12   Highest education level: Not on file  Occupational History   Occupation: Education administrator business   Occupation: caregiver  Tobacco Use   Smoking status: Former    Packs/day: 0.25    Years: 30.00    Total pack years: 7.50    Types: Cigarettes    Quit date: 01/21/2020    Years since quitting: 1.9   Smokeless tobacco: Never   Tobacco comments:    smokes "every now and then" 08/24/20 [facility]   Vaping Use    Vaping Use: Never used  Substance and Sexual Activity   Alcohol use: Yes    Alcohol/week: 5.0 standard drinks of alcohol    Types: 5 Standard drinks or equivalent per week    Comment: 4 out of 7 dyas per husband at bedside   Drug use: No   Sexual activity: Yes  Other Topics Concern   Not on file  Social History Narrative   HSG. Married '71. 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       One level home with spouse   Right handed   Caffeine - coffee 2-3 cups am   Exercise - treadmill    Education - 12th grade   Social Determinants of Health   Financial Resource Strain:  Low Risk  (09/21/2021)   Overall Financial Resource Strain (CARDIA)    Difficulty of Paying Living Expenses: Not hard at all  Food Insecurity: No Food Insecurity (09/21/2021)   Hunger Vital Sign    Worried About Running Out of Food in the Last Year: Never true    Ran Out of Food in the Last Year: Never true  Transportation Needs: No Transportation Needs (09/21/2021)   PRAPARE - Hydrologist (Medical): No    Lack of Transportation (Non-Medical): No  Physical Activity: Inactive (09/21/2021)   Exercise Vital Sign    Days of Exercise per Week: 0 days    Minutes of Exercise per Session: 0 min  Stress: No Stress Concern Present (09/21/2021)   [employer]    Feeling of Stress : Not at all  Social Connections: Moderately Integrated (09/21/2021)   Social Connection and Isolation Panel [NHANES]    Frequency of Communication with Friends and Family: Three times a week    Frequency of Social Gatherings with Friends and Family: Three times a week    Attends Religious Services: 1 to 4 times per year    Active Member of Clubs or Organizations: No    Attends Archivist Meetings: Never    Marital Status: Married  Human resources officer Violence: Not At  Risk (09/21/2021)   Humiliation, Afraid, Rape, and Kick questionnaire    Fear of Current or Ex-Partner: No    Emotionally Abused: No    Physically Abused: No    Sexually Abused: No      Family History  Problem Relation Age of Onset   Stroke Mother        Deceased, 77s   Atrial fibrillation Father    Colon cancer Father    Dementia Sister    Other Sister        Pick's disease, deceased   Healthy Daughter     There were no vitals filed for this visit.  PHYSICAL EXAM: GENERAL: Elderly frail WF; NAD NECK: Supple, No masses. Normal carotid upstrokes without bruits. No masses or thyromegaly.    CHEST: There are no chest wall deformities. There is no chest wall tenderness. Respirations are unlabored.  Lungs- coarse lung sounds; CTA b/l CARDIAC:  JVP: 8cm H2O        NS1S2 with +S3, Normal rate with regular rhythm. No murmurs, rubs or gallops.  Pulses are 2+ and symmetrical in upper and lower extremities. No edema.  ABDOMEN: Soft, non-tender, non-distended. There are no masses or hepatomegaly. There are normal bowel sounds.  EXTREMITIES: Warm and well perfused with no cyanosis, clubbing.  LYMPHATIC: No axillary or supraclavicular lymphadenopathy.  NEUROLOGIC: Patient is oriented x3 with no focal or lateralizing neurologic deficits.  PSYCH: Patients affect is appropriate, there is no evidence of anxiety or depression.  SKIN: Warm and dry; no lesions or wounds.   DATA REVIEW  ECG: Atrial paced, V sensed at 70BPM as per my read  ECHO: 03/22/22: LVEF 60-65%, LVH; Grade III diastolic dysfunction; normal RV function as per my read.   CMR:  09/26/2021: 1. Normal biventricular chamber size and function, LVEF 57%, RVEF 58%.  2. There is post contrast delayed myocardial enhancement. Midmyocardial circumferential stripe of delayed enhancement at the base. Midmyocardial delayed enhancement in the lateral wall at the mid ventricle. Findings are nonspecific. Overall, findings may represent  hypertensive heart disease. However, patient is chronically on plaquenil and this constellation of findings  in combination with clinical presentation does not exclude plaquenil cardiomyopathy.  ASSESSMENT & PLAN:  Heart failure with preserved ejection fraction, NYHA III On TTE she appears to be approaching grade III diastolic dysfunction. 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 I had a lengthy discussion with the patient and her husband regarding this diagnosis. Her risk factors include prolonged use of high dose hydroxychloroquine (>20 years of '400mg'$  at least), conduction system pathology and imaging that may suggestive of it. Since she has stopped taking plaquenil, the family agrees that invasive testing (myocardial biopsy) would be unnecessary. For the time being, we will continue to monitor her clinically and plan for a repeat TTE to assess LV function. Will continue Jardiance for HFpEF and lasix '20mg'$  PO daily (discussed dosing at length). I will hold off on spironolactone for the time being. Will consider decreasing metoprolol dose (HFpEF/COPD) if functional class declines.   2. Symptomatic bradycardia/SSS s/p dcPPM - Most recent interrogation from 8/23 w/ 95% atrial pacing-v sense with no episodes of atrial fibrillation during this period.   3. Atrial fibrillation  - Amiodarone '200mg'$  daily  - Metoprolol '25mg'$  BID - Eliquis '5mg'$  BID  3. COPD / NSIP - well controlled at this point; off prednisone  4. Cutaneous lupus - Plaquenil discontinued in mid to early 2023 - Methotrexate   Follow-up:  2 months  Latessa Tillis [facility]

## 2022-01-03 DIAGNOSIS — J849 Interstitial pulmonary disease, unspecified: Secondary | ICD-10-CM | POA: Diagnosis not present

## 2022-01-03 DIAGNOSIS — U071 COVID-19: Secondary | ICD-10-CM | POA: Diagnosis not present

## 2022-01-04 ENCOUNTER — Ambulatory Visit: Payer: PPO | Admitting: Pulmonary Disease

## 2022-01-04 ENCOUNTER — Encounter: Payer: Self-pay | Admitting: Pulmonary Disease

## 2022-01-04 VITALS — BP 120/66 | HR 79 | Temp 98.6°F | Wt 131.4 lb

## 2022-01-04 DIAGNOSIS — J439 Emphysema, unspecified: Secondary | ICD-10-CM | POA: Diagnosis not present

## 2022-01-04 DIAGNOSIS — J849 Interstitial pulmonary disease, unspecified: Secondary | ICD-10-CM | POA: Diagnosis not present

## 2022-01-04 MED ORDER — PREDNISONE 20 MG PO TABS: 40.0000 mg | ORAL_TABLET | Freq: Every day | ORAL | 0 refills | Status: DC

## 2022-01-04 MED ORDER — TRELEGY ELLIPTA 100-62.5-25 MCG/ACT IN AEPB: 1.0000 | INHALATION_SPRAY | Freq: Every day | RESPIRATORY_TRACT | 6 refills | Status: DC

## 2022-01-04 NOTE — Patient Instructions (Signed)
Nice to see you again  Lets try a different inhaler  Use Trelegy 1 puff once a day.  Rinse your mouth out with water after every use.  Stop the Anoro once you start in the Trelegy.  This adds an inhaled steroid otherwise is the same medicine as Anoro.  We will see if this helps decrease the bouts of cough or bronchitis.  I provided a 5-day supply of prednisone to have on hand if bronchitis symptoms come back in the meantime between visits.  Return to clinic in 3 months or sooner as needed with Dr. Silas Flood

## 2022-01-04 NOTE — Progress Notes (Signed)
_0  ID: Sheila Morales, female    DOB: 1950-07-26, 71 y.o.   MRN: [id number]  Chief Complaint  Patient presents with   Follow-up    Pt is here for follow up for bronchitis. Pt states she is doing well since last visit. Pt is on albuterol as needed. Tessalon pearls as needed for cough. And anoro daily. Pt states no issues so far with medication    Referring provider: Hoyt Morales, *  HPI:   71 y.o. 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  Most recent outpatient pulmonary Dr. Lamonte Morales reviewed.  Most recent cardiology note reviewed.  Most recent PCP note reviewed.  Overall, dyspnea stable to improved.  In the interim is met with the advanced heart failure doctor here in the [facility] system.  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  Has been describes intermittent bouts of what sounds like bronchitis.  Worsening cough for several days.  Mildly improved with albuterol.  Sounds like exacerbation of underlying obstructive lung disease.  She continues to report good adherence to Anoro.  Recent labs reviewed without any evidence of eosinophilia to suggest recurrence of chronic eosinophilic pneumonia.  Recent chest x-ray reviewed which appears clear.  She remains off prednisone for many weeks to months.  HPI at initial visit: Briefly, history of infiltrates in the past with area of posterior right upper lobe scarring.  This predated her amiodarone.  Also on methotrexate.  However, was hospitalized in late 2022 with volume overload and groundglass opacities favored to be most likely related to pulmonary edema.  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  She was placed on prednisone taper at discharge.  At the end of her prednisone, she developed dyspnea.  This worsened over the course of a few days.  Developed fever, came  to hospital.  Was found to be hypoxemic.  Coarse interstitial infiltrates on chest x-ray on my review and interpretation.  CT chest showed worsening area of fibrotic changes in the posterior right upper lobe as well as scattered groundglass.  At time of evaluation he is on 5 L nasal cannula with accessory muscle use.  Felt too ill to safely tolerate bronchoscopy.  Recommend ongoing diuresis but started prednisone half a milligram per kilogram, 40 mg daily.  After 1 dose her fever subsided, her hypoxemia resolved, her dyspnea exertion went away.  She had peripheral eosinophilia 400-500.  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   Questionaires / Pulmonary Flowsheets:   ACT:      No data to display          MMRC:     No data to display          Epworth:      No data to display          Tests:   FENO:  No results found for: "NITRICOXIDE"  PFT:    Latest Ref Rng & Units 02/06/2017    3:09 PM  PFT Results  FVC-Pre L 2.14   FVC-Predicted Pre % 72   FVC-Post L 2.22   FVC-Predicted Post % 74   Pre FEV1/FVC % % 60   Post FEV1/FCV % % 59   FEV1-Pre L 1.28   FEV1-Predicted  Pre % 56   FEV1-Post L 1.30   DLCO uncorrected ml/min/mmHg 12.45   DLCO UNC% % 54   DLCO corrected ml/min/mmHg 12.34   DLCO COR %Predicted % 53   DLVA Predicted % 66   TLC L 4.65   TLC % Predicted % 94   RV % Predicted % 107   Personally reviewed and interpreted 2018 PFTs where spirometry is suggestive of moderate obstruction, no bronchodilator response, TLC within normal limits, DLCO moderately reduced  WALK:      No data to display          Imaging: Personally reviewed and as per EMR discussed in this note No results found.  Lab Results: Personally reviewed CBC    Component Value Date/Time   WBC 6.2 12/14/2021 0903    RBC 4.12 12/14/2021 0903   HGB 12.7 12/14/2021 0903   HGB 11.9 05/22/2021 0842   HGB 13.1 01/13/2009 1108   HCT 38.5 12/14/2021 0903   HCT 35.3 05/22/2021 0842   HCT 37.5 01/13/2009 1108   PLT 195.0 12/14/2021 0903   PLT 194 05/22/2021 0842   MCV 93.3 12/14/2021 0903   MCV 92 05/22/2021 0842   MCV 86.2 01/13/2009 1108   MCH 30.7 10/27/2021 0808   MCHC 33.0 12/14/2021 0903   RDW 16.7 (H) 12/14/2021 0903   RDW 14.5 05/22/2021 0842   RDW 12.2 01/13/2009 1108   LYMPHSABS 1.0 11/27/2021 1156   LYMPHSABS 1.2 05/11/2020 1203   LYMPHSABS 2.2 01/13/2009 1108   MONOABS 0.9 11/27/2021 1156   MONOABS 0.7 01/13/2009 1108   EOSABS 0.0 11/27/2021 1156   EOSABS 0.1 05/11/2020 1203   BASOSABS 0.1 11/27/2021 1156   BASOSABS 0.0 05/11/2020 1203   BASOSABS 0.0 01/13/2009 1108    BMET    Component Value Date/Time   NA 131 (L) 01/01/2022 1050   NA 136 08/13/2021 1116   K 4.4 01/01/2022 1050   CL 98 01/01/2022 1050   CO2 25 01/01/2022 1050   GLUCOSE 96 01/01/2022 1050   BUN 19 01/01/2022 1050   BUN 17 08/13/2021 1116   CREATININE 1.19 (H) 01/01/2022 1050   CREATININE 0.91 11/22/2019 1502   CALCIUM 9.0 01/01/2022 1050   GFRNONAA 49 (L) 01/01/2022 1050   GFRAA 89 05/11/2020 1203    BNP    Component Value Date/Time   BNP 638.1 (H) 01/01/2022 1050    ProBNP    Component Value Date/Time   PROBNP 1,033.0 (H) 07/09/2021 1242    Specialty Problems       Pulmonary Problems   Smokers' cough ([place])   COPD (chronic obstructive pulmonary disease) ([place])   Acute hypoxemic respiratory failure ([place])   Community acquired pneumonia   Interstitial lung disease ([place])   Snoring    No Known Allergies  Immunization History  Administered Date(s) Administered   Fluad Quad(high Dose 65+) 03/29/2019   H1N1 05/31/2008   Influenza Split 02/13/2012   Influenza Whole 01/21/2008, 01/17/2009   Influenza, High Dose Seasonal PF 02/19/2016, 02/05/2018   Influenza,inj,Quad PF,6+ Mos 01/21/2014    Influenza-Unspecified 02/02/2015, 02/03/2017   Pneumococcal Conjugate-13 02/19/2016   Pneumococcal Polysaccharide-23 02/24/2017   Tdap 03/29/2019    Past Medical History:  Diagnosis Date   Acute respiratory failure with hypoxia ([place]) 11/05/2017   Arthritis    Colitis, ischemic (HCC)    COPD (chronic obstructive pulmonary disease) ([place])    External hemorrhoids    H/O: rheumatic fever    IBS (irritable bowel syndrome)    Personal history  colonic adenoma 03/25/2008   06/2007 right sided adenoma and 2 right hyperplastic polyps     Restless leg syndrome    questionable   Systemic lupus erythematosus (HCC)    Tubular adenoma of colon    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     Tobacco History: Social History   Tobacco Use  Smoking Status Former   Packs/day: 0.25   Years: 30.00   Total pack years: 7.50   Types: Cigarettes   Quit date: 01/21/2020   Years since quitting: 1.9  Smokeless Tobacco Never  Tobacco Comments   smokes "every now and then" 08/24/20 [facility]    Counseling given: Not Answered Tobacco comments: smokes "every now and then" 08/24/20 [facility]    Continue to not smoke  Outpatient Encounter Medications as of 01/04/2022  Medication Sig   albuterol (VENTOLIN HFA) 108 (90 Base) MCG/ACT inhaler Inhale 1-2 puffs into the lungs every 6 (six) hours as needed for wheezing or shortness of breath.   amiodarone (PACERONE) 200 MG tablet Take 1 tablet (200 mg total) by mouth daily.   apixaban (ELIQUIS) 5 MG TABS tablet Take 1 tablet (5 mg total) by mouth 2 (two) times daily.   benzonatate (TESSALON) 200 MG capsule Take 1 capsule (200 mg total) by mouth 3 (three) times daily as needed for cough.   calcium carbonate (OS-CAL - DOSED IN MG OF ELEMENTAL CALCIUM) 1250 (500 Ca) MG tablet Take 1 tablet by mouth every evening.   Cholecalciferol (VITAMIN D) 50 MCG (2000 UT) tablet Take 2,000 Units by mouth every evening.   empagliflozin (JARDIANCE) 10 MG TABS tablet Take 1 tablet (10 mg  total) by mouth daily before breakfast.   fluticasone (FLONASE) 50 MCG/ACT nasal spray SPRAY 2 SPRAYS INTO EACH NOSTRIL EVERY DAY   Fluticasone-Umeclidin-Vilant (TRELEGY ELLIPTA) 100-62.5-25 MCG/ACT AEPB Inhale 1 puff into the lungs daily.   folic acid (FOLVITE) 1 MG tablet Take 1 mg by mouth every evening.   furosemide (LASIX) 20 MG tablet TAKE 1 TABLET (20 MG) BY MOUTH DAILY, TAKE 1 EXTRA TABLET BY MOUTH (20 MG) AS NEEDED FOR INCREASED SWELLING.   gabapentin (NEURONTIN) 300 MG capsule TAKE TWO CAPSULES BY MOUTH EVERY MORNING, TWO CAPSULES EACH AFTERNOON AND THREE CAPSULES AT BEDTIME   methotrexate 2.5 MG tablet Take 10 mg by mouth every Thursday.   metoprolol tartrate (LOPRESSOR) 25 MG tablet Take 1 tablet (25 mg total) by mouth 2 (two) times daily.   Misc Natural Products (ADVANCED JOINT RELIEF) CAPS Take 1 capsule by mouth daily. Joint Advantage Gold 5x   Multiple Vitamin (MULTIVITAMIN WITH MINERALS) TABS tablet Take 1 tablet by mouth daily.   predniSONE (DELTASONE) 20 MG tablet Take 2 tablets (40 mg total) by mouth daily. As needed if bronchitis recurs.   RESTASIS 0.05 % ophthalmic emulsion Place 1 drop into both eyes 2 (two) times daily.   triamcinolone cream (KENALOG) 0.1 % Apply 1 application. topically 2 (two) times daily. (Patient taking differently: Apply 1 application  topically as needed.)   vitamin C (ASCORBIC ACID) 500 MG tablet Take 500 mg by mouth daily.   vitamin E 180 MG (400 UNITS) capsule Take 400 Units by mouth daily.   zolpidem (AMBIEN) 5 MG tablet TAKE 1 TABLET BY MOUTH EVERY DAY AT BEDTIME AS NEEDED FOR SLEEP   [DISCONTINUED] umeclidinium-vilanterol (ANORO ELLIPTA) 62.5-25 MCG/ACT AEPB INHALE 1 PUFF BY MOUTH EVERY DAY   No facility-administered encounter medications on file as of 01/04/2022.     Review of  Systems  Review of Systems  N/a Physical Exam  BP 120/66 (BP Location: Left Arm, Patient Position: Sitting, Cuff Size: Normal)   Pulse 79   Temp 98.6 F (37 C)  (Oral)   Wt 131 lb 6.4 oz (59.6 kg)   SpO2 95%   BMI 24.03 kg/m   Wt Readings from Last 5 Encounters:  01/04/22 131 lb 6.4 oz (59.6 kg)  01/01/22 128 lb 12.8 oz (58.4 kg)  12/18/21 126 lb (57.2 kg)  11/27/21 128 lb (58.1 kg)  11/02/21 131 lb (59.4 kg)    BMI Readings from Last 5 Encounters:  01/04/22 24.03 kg/m  01/01/22 23.56 kg/m  12/18/21 23.05 kg/m  11/27/21 23.41 kg/m  11/02/21 23.96 kg/m     Physical Exam General: Sitting in chair, appears drowsy Pulmonary: Mild crackle left base, otherwise clear, good air movement Cardiovascular: Regular in rhythm Neuro: Gait imbalance with walker, no focal weakness on exam   Assessment & Plan:   Acute hypoxemic respite failure with progressive chronic pulmonary infiltrates with worsened fever and cough: Concerning for underlying inflammatory lung disease, favor NSIP given pattern and groundglass opacities on imaging on admission.  Suspect prior infiltrates 03/2021 reflective of this process as well as volume overload given bilateral pleural effusions now improved.  Small area of what looks like fibrosis in the right upper lobe in 03/21/2021.  Overall pattern improved from last admission but with progressive fibrosis particular in the right upper lobe as well as bronchiectasis and bilateral but right greater than left lower lobes.  No clear signs of pulmonary infection on CT scan on my interpretation.  Fear this is early fibrosis and signs of volume loss.  She had fever, productive cough worsening hypoxemia in the setting of steroid taper.  Procalcitonin was negative.  The differential also includes methotrexate lung toxicity although the pattern does not appear classic for this.  In addition, amiodarone toxicity is possible but given initial area of fibrosis predates amiodarone and the relative short duration of amiodarone administration this is felt unlikely.  She had peripheral eosinophilia of 400 on admission.  Marked improvement and  essentially resolution of fever, hypoxemia, dyspnea, cough after 1 dose of prednisone 40 mg daily.  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  Gradual improvement in symptoms with prednisone taper, chest x-ray improving 05/12/2021.  Breathing suddenly worsen later early spring 2023 felt to be largely related to volume overload given elevated BNP and improvement with diuresis.  Chest x-ray demonstrated interstitial prominence felt now to be related to pulmonary edema.  She has completed steroid taper without recurrence of symptoms.  She is not requiring oxygen.  Continue diuretics per cardiology.  Moderate severe COPD: Based on PTs 2018.  With recurrent exacerbations, bronchitis requiring prednisone recently.  Escalate Anoro to Trelegy for triple inhaled therapy given recurrent exacerbations.  Prednisone to have on hand if symptoms recur.   Return in about 3 months (around 04/05/2022).   Lanier Clam, MD 01/04/2022

## 2022-01-08 NOTE — Telephone Encounter (Signed)
Note not needed 

## 2022-01-10 ENCOUNTER — Ambulatory Visit (HOSPITAL_COMMUNITY)
Admission: RE | Admit: 2022-01-10 | Discharge: 2022-01-10 | Disposition: A | Discharge status: Home / Self Care | Payer: PPO | Source: Ambulatory Visit | Attending: Internal Medicine | Admitting: Internal Medicine

## 2022-01-10 DIAGNOSIS — I5032 Chronic diastolic (congestive) heart failure: Secondary | ICD-10-CM

## 2022-01-13 LAB — ECHOCARDIOGRAM COMPLETE
AR max vel: 1.83 cm2
AV Peak grad: 7.6 mmHg
Ao pk vel: 1.38 m/s
Area-P 1/2: 3.06 cm2
MV VTI: 1.48 cm2
S' Lateral: 2.9 cm

## 2022-01-15 DIAGNOSIS — R29818 Other symptoms and signs involving the nervous system: Secondary | ICD-10-CM | POA: Diagnosis not present

## 2022-01-15 DIAGNOSIS — R260 Ataxic gait: Secondary | ICD-10-CM | POA: Diagnosis not present

## 2022-01-16 ENCOUNTER — Other Ambulatory Visit: Payer: Self-pay | Admitting: Internal Medicine

## 2022-01-21 DIAGNOSIS — R260 Ataxic gait: Secondary | ICD-10-CM | POA: Diagnosis not present

## 2022-01-21 DIAGNOSIS — R29818 Other symptoms and signs involving the nervous system: Secondary | ICD-10-CM | POA: Diagnosis not present

## 2022-01-23 DIAGNOSIS — R260 Ataxic gait: Secondary | ICD-10-CM | POA: Diagnosis not present

## 2022-01-23 DIAGNOSIS — R29818 Other symptoms and signs involving the nervous system: Secondary | ICD-10-CM | POA: Diagnosis not present

## 2022-01-24 ENCOUNTER — Other Ambulatory Visit: Payer: Self-pay | Admitting: Emergency Medicine

## 2022-01-24 DIAGNOSIS — J3089 Other allergic rhinitis: Secondary | ICD-10-CM

## 2022-01-24 DIAGNOSIS — Z79899 Other long term (current) drug therapy: Secondary | ICD-10-CM | POA: Diagnosis not present

## 2022-01-24 DIAGNOSIS — H524 Presbyopia: Secondary | ICD-10-CM | POA: Diagnosis not present

## 2022-01-24 DIAGNOSIS — J040 Acute laryngitis: Secondary | ICD-10-CM

## 2022-01-24 DIAGNOSIS — H04123 Dry eye syndrome of bilateral lacrimal glands: Secondary | ICD-10-CM | POA: Diagnosis not present

## 2022-01-24 DIAGNOSIS — H35013 Changes in retinal vascular appearance, bilateral: Secondary | ICD-10-CM | POA: Diagnosis not present

## 2022-01-24 DIAGNOSIS — H353111 Nonexudative age-related macular degeneration, right eye, early dry stage: Secondary | ICD-10-CM | POA: Diagnosis not present

## 2022-01-25 ENCOUNTER — Encounter: Payer: Self-pay | Admitting: *Deleted

## 2022-01-29 DIAGNOSIS — R260 Ataxic gait: Secondary | ICD-10-CM | POA: Diagnosis not present

## 2022-01-29 DIAGNOSIS — R29818 Other symptoms and signs involving the nervous system: Secondary | ICD-10-CM | POA: Diagnosis not present

## 2022-01-31 DIAGNOSIS — R260 Ataxic gait: Secondary | ICD-10-CM | POA: Diagnosis not present

## 2022-01-31 DIAGNOSIS — R29818 Other symptoms and signs involving the nervous system: Secondary | ICD-10-CM | POA: Diagnosis not present

## 2022-02-02 DIAGNOSIS — U071 COVID-19: Secondary | ICD-10-CM | POA: Diagnosis not present

## 2022-02-02 DIAGNOSIS — J849 Interstitial pulmonary disease, unspecified: Secondary | ICD-10-CM | POA: Diagnosis not present

## 2022-02-04 DIAGNOSIS — R29818 Other symptoms and signs involving the nervous system: Secondary | ICD-10-CM | POA: Diagnosis not present

## 2022-02-04 DIAGNOSIS — R260 Ataxic gait: Secondary | ICD-10-CM | POA: Diagnosis not present

## 2022-02-06 ENCOUNTER — Other Ambulatory Visit: Payer: Self-pay | Admitting: Internal Medicine

## 2022-02-10 ENCOUNTER — Encounter: Payer: Self-pay | Admitting: Internal Medicine

## 2022-02-10 ENCOUNTER — Other Ambulatory Visit: Payer: Self-pay | Admitting: Internal Medicine

## 2022-02-10 ENCOUNTER — Encounter: Payer: Self-pay | Admitting: Pulmonary Disease

## 2022-02-10 DIAGNOSIS — I480 Paroxysmal atrial fibrillation: Secondary | ICD-10-CM

## 2022-02-11 DIAGNOSIS — R260 Ataxic gait: Secondary | ICD-10-CM | POA: Diagnosis not present

## 2022-02-11 DIAGNOSIS — R29818 Other symptoms and signs involving the nervous system: Secondary | ICD-10-CM | POA: Diagnosis not present

## 2022-02-11 MED ORDER — APIXABAN 5 MG PO TABS: 5.0000 mg | ORAL_TABLET | Freq: Two times a day (BID) | ORAL | 0 refills | Status: DC

## 2022-02-14 DIAGNOSIS — R260 Ataxic gait: Secondary | ICD-10-CM | POA: Diagnosis not present

## 2022-02-14 DIAGNOSIS — Z79899 Other long term (current) drug therapy: Secondary | ICD-10-CM | POA: Diagnosis not present

## 2022-02-14 DIAGNOSIS — L932 Other local lupus erythematosus: Secondary | ICD-10-CM | POA: Diagnosis not present

## 2022-02-14 DIAGNOSIS — Z6823 Body mass index (BMI) 23.0-23.9, adult: Secondary | ICD-10-CM | POA: Diagnosis not present

## 2022-02-14 DIAGNOSIS — R29818 Other symptoms and signs involving the nervous system: Secondary | ICD-10-CM | POA: Diagnosis not present

## 2022-02-14 DIAGNOSIS — M329 Systemic lupus erythematosus, unspecified: Secondary | ICD-10-CM | POA: Diagnosis not present

## 2022-02-14 DIAGNOSIS — I7300 Raynaud's syndrome without gangrene: Secondary | ICD-10-CM | POA: Diagnosis not present

## 2022-02-14 DIAGNOSIS — M48061 Spinal stenosis, lumbar region without neurogenic claudication: Secondary | ICD-10-CM | POA: Diagnosis not present

## 2022-02-18 DIAGNOSIS — R29818 Other symptoms and signs involving the nervous system: Secondary | ICD-10-CM | POA: Diagnosis not present

## 2022-02-18 DIAGNOSIS — R260 Ataxic gait: Secondary | ICD-10-CM | POA: Diagnosis not present

## 2022-02-18 MED ORDER — PREDNISONE 20 MG PO TABS: 40.0000 mg | ORAL_TABLET | Freq: Every day | ORAL | 1 refills | Status: DC

## 2022-02-18 NOTE — Telephone Encounter (Signed)
Prednisone order placed. Pt notified. Nothing further needed at this time.

## 2022-02-19 ENCOUNTER — Encounter: Payer: Self-pay | Admitting: Internal Medicine

## 2022-02-19 ENCOUNTER — Encounter (HOSPITAL_COMMUNITY): Payer: Self-pay | Admitting: Cardiology

## 2022-02-19 ENCOUNTER — Encounter: Payer: Self-pay | Admitting: Pulmonary Disease

## 2022-02-19 NOTE — Telephone Encounter (Signed)
Will forward to Dr. Silas Flood as Juluis Rainier. Thanks.

## 2022-02-21 ENCOUNTER — Encounter (HOSPITAL_COMMUNITY): Payer: Self-pay

## 2022-02-21 ENCOUNTER — Inpatient Hospital Stay (HOSPITAL_COMMUNITY): Payer: PPO

## 2022-02-21 ENCOUNTER — Other Ambulatory Visit: Payer: Self-pay

## 2022-02-21 ENCOUNTER — Emergency Department (HOSPITAL_BASED_OUTPATIENT_CLINIC_OR_DEPARTMENT_OTHER): Payer: PPO | Admitting: Radiology

## 2022-02-21 ENCOUNTER — Encounter (HOSPITAL_BASED_OUTPATIENT_CLINIC_OR_DEPARTMENT_OTHER): Payer: Self-pay | Admitting: Emergency Medicine

## 2022-02-21 ENCOUNTER — Inpatient Hospital Stay (HOSPITAL_BASED_OUTPATIENT_CLINIC_OR_DEPARTMENT_OTHER)
Admission: EM | Admit: 2022-02-21 | Discharge: 2022-03-02 | DRG: 871 | Disposition: A | Discharge status: Home / Self Care | Payer: PPO | Attending: Internal Medicine | Admitting: Internal Medicine

## 2022-02-21 DIAGNOSIS — E871 Hypo-osmolality and hyponatremia: Secondary | ICD-10-CM | POA: Diagnosis present

## 2022-02-21 DIAGNOSIS — I5032 Chronic diastolic (congestive) heart failure: Secondary | ICD-10-CM | POA: Diagnosis not present

## 2022-02-21 DIAGNOSIS — Z7901 Long term (current) use of anticoagulants: Secondary | ICD-10-CM

## 2022-02-21 DIAGNOSIS — Z800 Family history of malignant neoplasm of digestive organs: Secondary | ICD-10-CM | POA: Diagnosis not present

## 2022-02-21 DIAGNOSIS — J439 Emphysema, unspecified: Secondary | ICD-10-CM | POA: Diagnosis present

## 2022-02-21 DIAGNOSIS — J9621 Acute and chronic respiratory failure with hypoxia: Secondary | ICD-10-CM | POA: Diagnosis not present

## 2022-02-21 DIAGNOSIS — Z950 Presence of cardiac pacemaker: Secondary | ICD-10-CM

## 2022-02-21 DIAGNOSIS — I480 Paroxysmal atrial fibrillation: Secondary | ICD-10-CM | POA: Diagnosis not present

## 2022-02-21 DIAGNOSIS — I472 Ventricular tachycardia, unspecified: Secondary | ICD-10-CM | POA: Diagnosis present

## 2022-02-21 DIAGNOSIS — J9601 Acute respiratory failure with hypoxia: Secondary | ICD-10-CM | POA: Diagnosis present

## 2022-02-21 DIAGNOSIS — Z20822 Contact with and (suspected) exposure to covid-19: Secondary | ICD-10-CM | POA: Diagnosis not present

## 2022-02-21 DIAGNOSIS — G629 Polyneuropathy, unspecified: Secondary | ICD-10-CM | POA: Diagnosis present

## 2022-02-21 DIAGNOSIS — J849 Interstitial pulmonary disease, unspecified: Secondary | ICD-10-CM | POA: Diagnosis present

## 2022-02-21 DIAGNOSIS — R918 Other nonspecific abnormal finding of lung field: Secondary | ICD-10-CM | POA: Diagnosis not present

## 2022-02-21 DIAGNOSIS — M329 Systemic lupus erythematosus, unspecified: Secondary | ICD-10-CM | POA: Diagnosis present

## 2022-02-21 DIAGNOSIS — G2581 Restless legs syndrome: Secondary | ICD-10-CM | POA: Diagnosis not present

## 2022-02-21 DIAGNOSIS — R0602 Shortness of breath: Secondary | ICD-10-CM | POA: Diagnosis not present

## 2022-02-21 DIAGNOSIS — I495 Sick sinus syndrome: Secondary | ICD-10-CM | POA: Diagnosis not present

## 2022-02-21 DIAGNOSIS — A419 Sepsis, unspecified organism: Secondary | ICD-10-CM | POA: Diagnosis not present

## 2022-02-21 DIAGNOSIS — Z7984 Long term (current) use of oral hypoglycemic drugs: Secondary | ICD-10-CM | POA: Diagnosis not present

## 2022-02-21 DIAGNOSIS — Z7951 Long term (current) use of inhaled steroids: Secondary | ICD-10-CM | POA: Diagnosis not present

## 2022-02-21 DIAGNOSIS — Z87891 Personal history of nicotine dependence: Secondary | ICD-10-CM

## 2022-02-21 DIAGNOSIS — J189 Pneumonia, unspecified organism: Secondary | ICD-10-CM | POA: Diagnosis not present

## 2022-02-21 DIAGNOSIS — R0603 Acute respiratory distress: Secondary | ICD-10-CM | POA: Diagnosis not present

## 2022-02-21 DIAGNOSIS — J441 Chronic obstructive pulmonary disease with (acute) exacerbation: Secondary | ICD-10-CM | POA: Diagnosis present

## 2022-02-21 DIAGNOSIS — M3213 Lung involvement in systemic lupus erythematosus: Secondary | ICD-10-CM | POA: Diagnosis not present

## 2022-02-21 DIAGNOSIS — J449 Chronic obstructive pulmonary disease, unspecified: Secondary | ICD-10-CM | POA: Diagnosis present

## 2022-02-21 DIAGNOSIS — Z823 Family history of stroke: Secondary | ICD-10-CM

## 2022-02-21 DIAGNOSIS — J90 Pleural effusion, not elsewhere classified: Secondary | ICD-10-CM | POA: Diagnosis not present

## 2022-02-21 DIAGNOSIS — Z79899 Other long term (current) drug therapy: Secondary | ICD-10-CM | POA: Diagnosis not present

## 2022-02-21 DIAGNOSIS — J168 Pneumonia due to other specified infectious organisms: Secondary | ICD-10-CM | POA: Diagnosis not present

## 2022-02-21 HISTORY — DX: Heart failure, unspecified: I50.9

## 2022-02-21 HISTORY — DX: Presence of cardiac pacemaker: Z95.0

## 2022-02-21 HISTORY — DX: Unspecified atrial fibrillation: I48.91

## 2022-02-21 LAB — BLOOD GAS, ARTERIAL
Acid-base deficit: 2.8 mmol/L — ABNORMAL HIGH (ref 0.0–2.0)
Bicarbonate: 20.8 mmol/L (ref 20.0–28.0)
Drawn by: 23281
O2 Saturation: 99.7 %
Patient temperature: 37.7
pCO2 arterial: 33 mmHg (ref 32–48)
pH, Arterial: 7.41 (ref 7.35–7.45)
pO2, Arterial: 114 mmHg — ABNORMAL HIGH (ref 83–108)

## 2022-02-21 LAB — URINALYSIS, ROUTINE W REFLEX MICROSCOPIC
Bilirubin Urine: NEGATIVE
Glucose, UA: NEGATIVE mg/dL
Hgb urine dipstick: NEGATIVE
Ketones, ur: NEGATIVE mg/dL
Leukocytes,Ua: NEGATIVE
Nitrite: NEGATIVE
Specific Gravity, Urine: 1.01 (ref 1.005–1.030)
pH: 6.5 (ref 5.0–8.0)

## 2022-02-21 LAB — I-STAT ARTERIAL BLOOD GAS, ED
Acid-base deficit: 1 mmol/L (ref 0.0–2.0)
Bicarbonate: 23.3 mmol/L (ref 20.0–28.0)
Calcium, Ion: 1.2 mmol/L (ref 1.15–1.40)
HCT: 33 % — ABNORMAL LOW (ref 36.0–46.0)
Hemoglobin: 11.2 g/dL — ABNORMAL LOW (ref 12.0–15.0)
O2 Saturation: 91 %
Patient temperature: 99.3
Potassium: 3.9 mmol/L (ref 3.5–5.1)
Sodium: 130 mmol/L — ABNORMAL LOW (ref 135–145)
TCO2: 24 mmol/L (ref 22–32)
pCO2 arterial: 35.3 mmHg (ref 32–48)
pH, Arterial: 7.43 (ref 7.35–7.45)
pO2, Arterial: 61 mmHg — ABNORMAL LOW (ref 83–108)

## 2022-02-21 LAB — CBC
HCT: 36.7 % (ref 36.0–46.0)
Hemoglobin: 12.2 g/dL (ref 12.0–15.0)
MCH: 31.4 pg (ref 26.0–34.0)
MCHC: 33.2 g/dL (ref 30.0–36.0)
MCV: 94.3 fL (ref 80.0–100.0)
Platelets: 272 10*3/uL (ref 150–400)
RBC: 3.89 MIL/uL (ref 3.87–5.11)
RDW: 16.1 % — ABNORMAL HIGH (ref 11.5–15.5)
WBC: 10.1 10*3/uL (ref 4.0–10.5)
nRBC: 0 % (ref 0.0–0.2)

## 2022-02-21 LAB — BASIC METABOLIC PANEL
Anion gap: 10 (ref 5–15)
BUN: 18 mg/dL (ref 8–23)
CO2: 26 mmol/L (ref 22–32)
Calcium: 9.5 mg/dL (ref 8.9–10.3)
Chloride: 96 mmol/L — ABNORMAL LOW (ref 98–111)
Creatinine, Ser: 1.06 mg/dL — ABNORMAL HIGH (ref 0.44–1.00)
GFR, Estimated: 56 mL/min — ABNORMAL LOW (ref >60)
Glucose, Bld: 106 mg/dL — ABNORMAL HIGH (ref 70–99)
Potassium: 4.3 mmol/L (ref 3.5–5.1)
Sodium: 132 mmol/L — ABNORMAL LOW (ref 135–145)

## 2022-02-21 LAB — SARS CORONAVIRUS 2 BY RT PCR: SARS Coronavirus 2 by RT PCR: NEGATIVE

## 2022-02-21 LAB — LACTIC ACID, PLASMA: Lactic Acid, Venous: 1.6 mmol/L (ref 0.5–1.9)

## 2022-02-21 LAB — STREP PNEUMONIAE URINARY ANTIGEN: Strep Pneumo Urinary Antigen: NEGATIVE

## 2022-02-21 LAB — PROTIME-INR
INR: 1.5 — ABNORMAL HIGH (ref 0.8–1.2)
Prothrombin Time: 17.8 seconds — ABNORMAL HIGH (ref 11.4–15.2)

## 2022-02-21 MED ORDER — ACETAMINOPHEN 325 MG PO TABS
650.0000 mg | ORAL_TABLET | Freq: Once | ORAL | Status: AC
  Administered 2022-02-21: 650 mg via ORAL
  Filled 2022-02-21: qty 2

## 2022-02-21 MED ORDER — GABAPENTIN 300 MG PO CAPS
300.0000 mg | ORAL_CAPSULE | Freq: Three times a day (TID) | ORAL | Status: DC
  Administered 2022-02-22 – 2022-03-02 (×24): 300 mg via ORAL
  Filled 2022-02-21 (×25): qty 1

## 2022-02-21 MED ORDER — SODIUM CHLORIDE 0.9 % IV SOLN
500.0000 mg | Freq: Once | INTRAVENOUS | Status: AC
  Administered 2022-02-21: 500 mg via INTRAVENOUS
  Filled 2022-02-21: qty 5

## 2022-02-21 MED ORDER — SODIUM CHLORIDE 0.9 % IV SOLN
500.0000 mg | INTRAVENOUS | Status: DC
  Administered 2022-02-22 – 2022-02-23 (×2): 500 mg via INTRAVENOUS
  Filled 2022-02-21 (×4): qty 5

## 2022-02-21 MED ORDER — VITAMIN E 180 MG (400 UNIT) PO CAPS
400.0000 [IU] | ORAL_CAPSULE | Freq: Every day | ORAL | Status: DC
  Administered 2022-02-22 – 2022-03-02 (×7): 400 [IU] via ORAL
  Filled 2022-02-21 (×10): qty 1

## 2022-02-21 MED ORDER — METOPROLOL TARTRATE 25 MG PO TABS
25.0000 mg | ORAL_TABLET | Freq: Two times a day (BID) | ORAL | Status: DC
  Administered 2022-02-21 – 2022-03-02 (×17): 25 mg via ORAL
  Filled 2022-02-21 (×17): qty 1

## 2022-02-21 MED ORDER — POLYETHYLENE GLYCOL 3350 17 G PO PACK: 17.0000 g | PACK | Freq: Every day | ORAL | Status: DC | PRN

## 2022-02-21 MED ORDER — FUROSEMIDE 20 MG PO TABS: 20.0000 mg | ORAL_TABLET | Freq: Every day | ORAL | Status: DC

## 2022-02-21 MED ORDER — EMPAGLIFLOZIN 10 MG PO TABS
10.0000 mg | ORAL_TABLET | Freq: Every day | ORAL | Status: DC
  Administered 2022-02-23 – 2022-03-02 (×8): 10 mg via ORAL
  Filled 2022-02-21 (×11): qty 1

## 2022-02-21 MED ORDER — VITAMIN D 25 MCG (1000 UNIT) PO TABS
4000.0000 [IU] | ORAL_TABLET | Freq: Every evening | ORAL | Status: DC
  Administered 2022-02-21 – 2022-03-02 (×10): 4000 [IU] via ORAL
  Filled 2022-02-21 (×11): qty 4

## 2022-02-21 MED ORDER — BENZONATATE 100 MG PO CAPS: 200.0000 mg | ORAL_CAPSULE | Freq: Three times a day (TID) | ORAL | Status: DC | PRN

## 2022-02-21 MED ORDER — APIXABAN 5 MG PO TABS
5.0000 mg | ORAL_TABLET | Freq: Two times a day (BID) | ORAL | Status: DC
  Administered 2022-02-21 – 2022-03-02 (×18): 5 mg via ORAL
  Filled 2022-02-21 (×18): qty 1

## 2022-02-21 MED ORDER — FOLIC ACID 1 MG PO TABS
1.0000 mg | ORAL_TABLET | Freq: Every evening | ORAL | Status: DC
  Administered 2022-02-22 – 2022-03-02 (×9): 1 mg via ORAL
  Filled 2022-02-21 (×9): qty 1

## 2022-02-21 MED ORDER — VITAMIN C 500 MG PO TABS
500.0000 mg | ORAL_TABLET | Freq: Every day | ORAL | Status: DC
  Administered 2022-02-22 – 2022-03-02 (×8): 500 mg via ORAL
  Filled 2022-02-21 (×9): qty 1

## 2022-02-21 MED ORDER — LORAZEPAM 2 MG/ML IJ SOLN
INTRAMUSCULAR | Status: AC
  Filled 2022-02-21: qty 1

## 2022-02-21 MED ORDER — SODIUM CHLORIDE 0.9 % IV SOLN: INTRAVENOUS | Status: DC

## 2022-02-21 MED ORDER — ALBUTEROL SULFATE (2.5 MG/3ML) 0.083% IN NEBU
2.5000 mg | INHALATION_SOLUTION | Freq: Four times a day (QID) | RESPIRATORY_TRACT | Status: DC | PRN
  Administered 2022-02-24 – 2022-02-27 (×2): 2.5 mg via RESPIRATORY_TRACT
  Filled 2022-02-21 (×2): qty 3

## 2022-02-21 MED ORDER — UMECLIDINIUM BROMIDE 62.5 MCG/ACT IN AEPB
1.0000 | INHALATION_SPRAY | Freq: Every day | RESPIRATORY_TRACT | Status: DC
  Administered 2022-02-22 – 2022-02-23 (×2): 1 via RESPIRATORY_TRACT
  Filled 2022-02-21: qty 7

## 2022-02-21 MED ORDER — ALBUTEROL SULFATE HFA 108 (90 BASE) MCG/ACT IN AERS: 2.0000 | INHALATION_SPRAY | RESPIRATORY_TRACT | Status: DC | PRN

## 2022-02-21 MED ORDER — FLUTICASONE FUROATE-VILANTEROL 100-25 MCG/ACT IN AEPB
1.0000 | INHALATION_SPRAY | Freq: Every day | RESPIRATORY_TRACT | Status: DC
  Administered 2022-02-22 – 2022-02-23 (×2): 1 via RESPIRATORY_TRACT
  Filled 2022-02-21: qty 28

## 2022-02-21 MED ORDER — AMIODARONE HCL 200 MG PO TABS
200.0000 mg | ORAL_TABLET | Freq: Every evening | ORAL | Status: DC
  Administered 2022-02-21 – 2022-03-02 (×10): 200 mg via ORAL
  Filled 2022-02-21 (×10): qty 1

## 2022-02-21 MED ORDER — METHYLPREDNISOLONE SODIUM SUCC 125 MG IJ SOLR
125.0000 mg | Freq: Once | INTRAMUSCULAR | Status: AC
  Administered 2022-02-21: 125 mg via INTRAVENOUS
  Filled 2022-02-21: qty 2

## 2022-02-21 MED ORDER — SODIUM CHLORIDE 0.9 % IV SOLN
1.0000 g | Freq: Once | INTRAVENOUS | Status: AC
  Administered 2022-02-21: 1 g via INTRAVENOUS
  Filled 2022-02-21: qty 10

## 2022-02-21 MED ORDER — IPRATROPIUM-ALBUTEROL 0.5-2.5 (3) MG/3ML IN SOLN
3.0000 mL | Freq: Four times a day (QID) | RESPIRATORY_TRACT | Status: DC | PRN
  Administered 2022-02-21 (×2): 3 mL via RESPIRATORY_TRACT
  Filled 2022-02-21 (×2): qty 3

## 2022-02-21 MED ORDER — ZOLPIDEM TARTRATE 5 MG PO TABS
5.0000 mg | ORAL_TABLET | Freq: Every evening | ORAL | Status: DC | PRN
  Administered 2022-02-22 – 2022-03-01 (×7): 5 mg via ORAL
  Filled 2022-02-21 (×8): qty 1

## 2022-02-21 MED ORDER — LORAZEPAM 2 MG/ML IJ SOLN
0.2500 mg | Freq: Once | INTRAMUSCULAR | Status: AC
  Administered 2022-02-21: 0.25 mg via INTRAVENOUS

## 2022-02-21 MED ORDER — SODIUM CHLORIDE 0.9 % IV SOLN
2.0000 g | INTRAVENOUS | Status: DC
  Administered 2022-02-22 – 2022-02-23 (×2): 2 g via INTRAVENOUS
  Filled 2022-02-21 (×2): qty 20

## 2022-02-21 MED ORDER — SODIUM CHLORIDE 0.9 % IV SOLN: INTRAVENOUS | Status: DC | PRN

## 2022-02-21 MED ORDER — SODIUM CHLORIDE 0.9 % IV SOLN: 1.0000 g | Freq: Once | INTRAVENOUS | Status: DC

## 2022-02-21 MED ORDER — FLUTICASONE PROPIONATE 50 MCG/ACT NA SUSP
1.0000 | Freq: Every day | NASAL | Status: DC
  Administered 2022-02-22 – 2022-03-02 (×7): 1 via NASAL
  Filled 2022-02-21 (×3): qty 16

## 2022-02-21 NOTE — ED Provider Notes (Signed)
[facility] EMERGENCY DEPT Provider Note   CSN: [id number] Arrival date & time: 02/21/22  1002     History  Chief Complaint  Patient presents with   Shortness of Breath    Sheila Morales is a 71 y.o. female.  HPI 71 year old female history of COPD, CHF, presents today with fever, weakness, decreased oxygen saturations for several days.  Patient is sleepy on exam.  History is obtained from husband.  He states that she has had some mild coughing.  Her oxygen saturations have decreased to 88 to 90% and she has been using her 2 L of oxygen which she uses as needed when this happens.  She has been more lethargic today.  He denies any vomiting or diarrhea.  She has not had anything by mouth today, but yesterday she did eat a regular full dinner.  He states she never eats very much.  She has been urinating as usual for her husband. Patient primary care is Pricilla Holm She is also followed by cardiology and pulmonary    Home Medications Prior to Admission medications   Medication Sig Start Date End Date Taking? Authorizing Provider  albuterol (VENTOLIN HFA) 108 (90 Base) MCG/ACT inhaler Inhale 1-2 puffs into the lungs every 6 (six) hours as needed for wheezing or shortness of breath. 10/13/20   Byrum, Rose Fillers, MD  amiodarone (PACERONE) 200 MG tablet Take 1 tablet (200 mg total) by mouth daily. 08/29/21   Elouise Munroe, MD  apixaban (ELIQUIS) 5 MG TABS tablet Take 1 tablet (5 mg total) by mouth 2 (two) times daily. 02/11/22   [name], MD  benzonatate (TESSALON) 200 MG capsule TAKE 1 CAPSULE (200 MG TOTAL) BY MOUTH EVERY 6 (SIX) HOURS AS NEEDED FOR COUGH. 01/25/22   Cobb, Karie Schwalbe, NP  calcium carbonate (OS-CAL - DOSED IN MG OF ELEMENTAL CALCIUM) 1250 (500 Ca) MG tablet Take 1 tablet by mouth every evening.    [provider]  Cholecalciferol (VITAMIN D) 50 MCG (2000 UT) tablet Take 2,000 Units by mouth every evening.    [provider]   empagliflozin (JARDIANCE) 10 MG TABS tablet Take 1 tablet (10 mg total) by mouth daily before breakfast. 09/27/21   Elouise Munroe, MD  fluticasone (FLONASE) 50 MCG/ACT nasal spray SPRAY 2 SPRAYS INTO EACH NOSTRIL EVERY DAY 09/28/21   Hunsucker, Bonna Gains, MD  Fluticasone-Umeclidin-Vilant (TRELEGY ELLIPTA) 100-62.5-25 MCG/ACT AEPB Inhale 1 puff into the lungs daily. 01/04/22   Hunsucker, Bonna Gains, MD  folic acid (FOLVITE) 1 MG tablet Take 1 mg by mouth every evening.    [provider]  furosemide (LASIX) 20 MG tablet TAKE 1 TABLET (20 MG) BY MOUTH DAILY, TAKE 1 EXTRA TABLET BY MOUTH (20 MG) AS NEEDED FOR INCREASED SWELLING. 09/17/21   Elouise Munroe, MD  gabapentin (NEURONTIN) 300 MG capsule TAKE TWO CAPSULES BY MOUTH EVERY MORNING, TWO CAPSULES EACH AFTERNOON AND THREE CAPSULES AT BEDTIME 02/07/22   Biagio Borg, MD  methotrexate 2.5 MG tablet Take 10 mg by mouth every Thursday.    [provider]  metoprolol tartrate (LOPRESSOR) 25 MG tablet Take 1 tablet (25 mg total) by mouth 2 (two) times daily. 11/12/21   Elouise Munroe, MD  Misc Natural Products (ADVANCED JOINT RELIEF) CAPS Take 1 capsule by mouth daily. Joint Advantage Gold 5x    [provider]  Multiple Vitamin (MULTIVITAMIN WITH MINERALS) TABS tablet Take 1 tablet by mouth daily.    [provider]  predniSONE (DELTASONE) 20 MG tablet Take 2 tablets (40 mg total) by mouth daily. As needed if bronchitis recurs. 02/18/22   Hunsucker, Bonna Gains, MD  RESTASIS 0.05 % ophthalmic emulsion Place 1 drop into both eyes 2 (two) times daily. 07/30/17   [provider]  triamcinolone cream (KENALOG) 0.1 % Apply 1 application. topically 2 (two) times daily. Patient taking differently: Apply 1 application  topically as needed. 09/18/21   Henson, Vickie L, NP-C  vitamin C (ASCORBIC ACID) 500 MG tablet Take 500 mg by mouth daily.    [provider]  vitamin E 180 MG (400 UNITS) capsule Take 400  Units by mouth daily.    [provider]  zolpidem (AMBIEN) 5 MG tablet TAKE 1 TABLET BY MOUTH EVERY DAY AT BEDTIME AS NEEDED FOR SLEEP 11/08/21   Hoyt Koch, MD      Allergies    Patient has no known allergies.    Review of Systems   Review of Systems  Physical Exam Updated Vital Signs BP 133/74   Pulse 73   Temp (!) 101.3 F (38.5 C)   Resp 19   SpO2 94%  Physical Exam Vitals reviewed.  Constitutional:      Appearance: She is well-developed.     Comments: Patient will open her eyes to verbal stimuli but is not alert without  HENT:     Head: Normocephalic.     Mouth/Throat:     Comments: Mucous membranes are dry Eyes:     Pupils: Pupils are equal, round, and reactive to light.  Cardiovascular:     Rate and Rhythm: Normal rate and regular rhythm.  Pulmonary:     Effort: Tachypnea present.     Breath sounds: Examination of the right-lower field reveals decreased breath sounds. Examination of the left-lower field reveals decreased breath sounds. Decreased breath sounds present. No wheezing, rhonchi or rales.  Abdominal:     General: Bowel sounds are normal.     Palpations: Abdomen is soft.  Musculoskeletal:        General: Normal range of motion.     Cervical back: Normal range of motion.     Right lower leg: No edema.     Left lower leg: No edema.  Skin:    General: Skin is warm and dry.     Capillary Refill: Capillary refill takes less than 2 seconds.  Neurological:     General: No focal deficit present.     ED Results / Procedures / Treatments   Labs (all labs ordered are listed, but only abnormal results are displayed) Labs Reviewed  CBC - Abnormal; Notable for the following components:      Result Value   RDW 16.1 (*)    All other components within normal limits  BASIC METABOLIC PANEL - Abnormal; Notable for the following components:   Sodium 132 (*)    Chloride 96 (*)    Glucose, Bld 106 (*)    Creatinine, Ser 1.06 (*)    GFR,  Estimated 56 (*)    All other components within normal limits  PROTIME-INR - Abnormal; Notable for the following components:   Prothrombin Time 17.8 (*)    INR 1.5 (*)    All other components within normal limits  URINALYSIS, ROUTINE W REFLEX MICROSCOPIC - Abnormal; Notable for the following components:   APPearance HAZY (*)    Protein, ur TRACE (*)    Bacteria, UA FEW (*)    All other components within normal  limits  I-STAT ARTERIAL BLOOD GAS, ED - Abnormal; Notable for the following components:   pO2, Arterial 61 (*)    Sodium 130 (*)    HCT 33.0 (*)    Hemoglobin 11.2 (*)    All other components within normal limits  SARS CORONAVIRUS 2 BY RT PCR  CULTURE, BLOOD (ROUTINE X 2)  CULTURE, BLOOD (ROUTINE X 2)  LACTIC ACID, PLASMA    EKG None  Radiology DG Chest 2 View  Addendum Date: 02/21/2022   ADDENDUM REPORT: 02/21/2022 12:09 ADDENDUM: By comparison with prior exams, note that the left sided marker on this exam was incorrectly placed on the patient's right side. Electronically Signed   By: Marlaine Hind M.D.   On: 02/21/2022 12:09   Result Date: 02/21/2022 CLINICAL DATA:  Shortness of breath and fatigue. EXAM: CHEST - 2 VIEW COMPARISON:  11/27/2021 FINDINGS: Heart size remains at the upper limits of normal. Permanent pacemaker remains in appropriate position. Asymmetric airspace disease is seen in the right upper lobe, suspicious for pneumonia. Bibasilar interstitial prominence shows no significant change compared to prior exam. No evidence of pleural effusion. IMPRESSION: Asymmetric right upper lobe airspace disease, suspicious for pneumonia. Recommend continued chest radiographic follow-up to confirm resolution. Electronically Signed: By: Marlaine Hind M.D. On: 02/21/2022 11:03    Procedures .Critical Care  Performed by: Pattricia Boss, MD Authorized by: Pattricia Boss, MD   Critical care provider statement:    Critical care time (minutes):  30   Critical care end time:   02/21/2022 1:14 PM   Critical care was necessary to treat or prevent imminent or life-threatening deterioration of the following conditions:  Respiratory failure   Critical care was time spent personally by me on the following activities:  Development of treatment plan with patient or surrogate, discussions with consultants, evaluation of patient's response to treatment, examination of patient, ordering and review of laboratory studies, ordering and review of radiographic studies, ordering and performing treatments and interventions, pulse oximetry, re-evaluation of patient's condition and review of old charts     Medications Ordered in ED Medications  azithromycin (ZITHROMAX) 500 mg in sodium chloride 0.9 % 250 mL IVPB (has no administration in time range)  0.9 %  sodium chloride infusion ( Intravenous New Bag/Given 02/21/22 1220)  cefTRIAXone (ROCEPHIN) 1 g in sodium chloride 0.9 % 100 mL IVPB (1 g Intravenous New Bag/Given 02/21/22 1221)  acetaminophen (TYLENOL) tablet 650 mg (650 mg Oral Given 02/21/22 1215)    ED Course/ Medical Decision Making/ A&P Clinical Course as of 02/21/22 1315  Thu Feb 21, 2022  1102 DG Chest 2 View [DR]  1105 CBC reviewed interpreted and within normal limits [DR]  1105 Chest x-Landra Howze reviewed interpreted and is marked incorrectly left versus right [DR]  52 Blood gas, arterial (at [facility]) [DR]    Clinical Course User Index [DR] Pattricia Boss, MD                           Medical Decision Making 71 year old female history of COPD presents today with fever and increased oxygen demand.  Chest x-Dalayla Aldredge with right upper lobe infiltrate.  Patient treated here with antibiotics.  She is on her home oxygen of 2 L.  She is oxygenating well at 95%  Diagnosis includes but is not limited to COPD exacerbation, CHF, pneumonia, PE, UTI-given infiltrate noted on x-Erandy Mceachern and oxygenation issues, most consistent with pneumonia.  First lactic is normal does not  need repeat with normal  heart rate and blood pressure Rocephin and Zithromax given Blood gas with PO2 60s no evidence of CO2 retention Discussed care with Dr. Olevia Bowens, on for for hospitalist.  He will write admitting orders  Amount and/or Complexity of Data Reviewed Labs: ordered. Decision-making details documented in ED Course. Radiology: ordered and independent interpretation performed. Decision-making details documented in ED Course. ECG/medicine tests: ordered and independent interpretation performed. Decision-making details documented in ED Course.  Risk OTC drugs. Prescription drug management.           Final Clinical Impression(s) / ED Diagnoses Final diagnoses:  Pneumonia of right upper lobe due to infectious organism    Rx / DC Orders ED Discharge Orders     None         Pattricia Boss, MD 02/21/22 1315

## 2022-02-21 NOTE — ED Triage Notes (Signed)
Short of breath at night, orthopnea. Pt states she is having trouble breathing.

## 2022-02-21 NOTE — Progress Notes (Signed)
Plan of Care Note for accepted transfer   Patient: Sheila Morales MRN: [id number]   DOA: 02/21/2022  [facility]ility [facility] transfer: [facility] Requesting Provider: Pattricia Boss, MD. Reason for transfer: Community-acquired pneumonia. Facility course:  Per Dr. Jeanell Sparrow: "Chief Complaint  Patient presents with   Shortness of Breath      Sheila Morales is a 71 y.o. female.   HPI 71 year old female history of COPD, CHF, presents today with fever, weakness, decreased oxygen saturations for several days.  Patient is sleepy on exam.  History is obtained from husband.  He states that she has had some mild coughing.  Her oxygen saturations have decreased to 88 to 90% and she has been using her 2 L of oxygen which she uses as needed when this happens.  She has been more lethargic today.  He denies any vomiting or diarrhea.  She has not had anything by mouth today, but yesterday she did eat a regular full dinner.  He states she never eats very much.  She has been urinating as usual for her husband. Patient primary care is Sheila Morales She is also followed by cardiology and pulmonary"   Lab work:  Component Value Units  I-Stat arterial blood gas, ED [[id number]] (Abnormal)   Collected: 02/21/22 1245   Updated: 02/21/22 1247    pH, Arterial 7.430   pCO2 arterial 35.3 mmHg   pO2, Arterial 61 Low  mmHg   Bicarbonate 23.3 mmol/L   TCO2 24 mmol/L   O2 Saturation 91 %   Acid-base deficit 1.0 mmol/L   Sodium 130 Low  mmol/L   Potassium 3.9 mmol/L   Calcium, Ion 1.20 mmol/L   HCT 33.0 Low  %   Hemoglobin 11.2 Low  g/dL   Patient temperature 99.3 F   Sample type ARTERIAL  Culture, blood (Routine x 2) [[id number]]   Collected: 02/21/22 1134   Updated: 02/21/22 1212   Specimen Type: Blood   Specimen Source: Peripheral   SARS Coronavirus 2 by RT PCR (hospital order, performed in [place] hospital lab) *cepheid single result test* Anterior Nasal Swab [[id number]]   Collected: 02/21/22 1134   Updated:  02/21/22 1201   Specimen Source: Anterior Nasal Swab   Basic metabolic panel [[id number]] (Abnormal)   Collected: 02/21/22 1017   Updated: 02/21/22 1123   Specimen Type: Blood   Specimen Source: Vein    Sodium 132 Low  mmol/L   Potassium 4.3 mmol/L   Chloride 96 Low  mmol/L   CO2 26 mmol/L   Glucose, Bld 106 High  mg/dL   BUN 18 mg/dL   Creatinine, Ser 1.06 High  mg/dL   Calcium 9.5 mg/dL   GFR, Estimated 56 Low  mL/min   Anion gap 10  Lactic acid, plasma [[id number]]   Collected: 02/21/22 1032   Updated: 02/21/22 1123   Specimen Type: Blood   Specimen Source: Vein    Lactic Acid, Venous 1.6 mmol/L  Culture, blood (Routine x 2) [[id number]]   Collected: 02/21/22 1032   Updated: 02/21/22 1110   Specimen Type: Blood   Specimen Source: Vein   Urinalysis, Routine w reflex microscopic Urine, Clean Catch [[id number]] (Abnormal)   Collected: 02/21/22 1047   Updated: 02/21/22 1106   Specimen Source: Urine, Clean Catch    Color, Urine YELLOW   APPearance HAZY Abnormal    Specific Gravity, Urine 1.010   pH 6.5   Glucose, UA NEGATIVE mg/dL   Hgb urine dipstick NEGATIVE   Bilirubin Urine NEGATIVE   Ketones,  ur NEGATIVE mg/dL   Protein, ur TRACE Abnormal  mg/dL   Nitrite NEGATIVE   Leukocytes,Ua NEGATIVE   RBC / HPF 0-5 RBC/hpf   WBC, UA 0-5 WBC/hpf   Bacteria, UA FEW Abnormal    Squamous Epithelial / LPF 11-20  Protime-INR [[id number]] (Abnormal)   Collected: 02/21/22 1032   Updated: 02/21/22 1055   Specimen Type: Blood   Specimen Source: Vein    Prothrombin Time 17.8 High  seconds   INR 1.5 High   CBC [[id number]] (Abnormal)   Collected: 02/21/22 1017   Updated: 02/21/22 1048   Specimen Type: Blood   Specimen Source: Vein    WBC 10.1 K/uL   RBC 3.89 MIL/uL   Hemoglobin 12.2 g/dL   HCT 36.7 %   MCV 94.3 fL   MCH 31.4 pg   MCHC 33.2 g/dL   RDW 16.1 High  %   Platelets 272 K/uL   nRBC 0.0 %   Imaging: CLINICAL DATA:  Shortness of breath and fatigue.   EXAM: CHEST  - 2 VIEW   COMPARISON:  11/27/2021   FINDINGS: Heart size remains at the upper limits of normal. Permanent pacemaker remains in appropriate position.   Asymmetric airspace disease is seen in the right upper lobe, suspicious for pneumonia. Bibasilar interstitial prominence shows no significant change compared to prior exam. No evidence of pleural effusion.   IMPRESSION: Asymmetric right upper lobe airspace disease, suspicious for pneumonia. Recommend continued chest radiographic follow-up to confirm resolution.   Electronically Signed: By: Marlaine Hind M.D. On: 02/21/2022 11:03  Plan of care: The patient has received ceftriaxone and azithromycin.  She has been accepted for admission to Telemetry unit, at [facility]    Author: Reubin Milan, MD 02/21/2022  Check [email or web address] for on-call coverage.  Nursing staff, Please call [place] number on Amion as soon as patient's arrival, so appropriate admitting provider can evaluate the pt.

## 2022-02-21 NOTE — ED Triage Notes (Signed)
Last week off/on fever, tylenol kept it away every 6 hours. Lethargic, congestion in her upper chest, no coughing.

## 2022-02-21 NOTE — H&P (Signed)
With complaint of fever, weakness, History and Physical    [name] JSH:[id number] DOB: May 29, 1950 DOA: 02/21/2022  PCP: Hoyt Koch, MD   Patient coming from: Home  Chief Complaint: Shortness of breath  HPI: Sheila Morales is a 71 y.o. female with medical history significant of COPD, SLE on methotrexate, interstitial lung disease/eosinophilic pneumonia, chronic diastolic CHF, paroxysmal A-fib, sick sinus syndrome status post pacemaker placement, chronic hyponatremia who presented to the emergency department at [facility] with complaint of fever, weakness, dyspnea, cough.  She was having the symptoms for 1 to 2 days.  Lives with her husband.  Ambulates with the help of walker.  Patient uses chronically 2 L of oxygen as needed at home for her COPD/ILD.  She is a past smoker.  It was noted that her saturation had dropped to 88 to 90% on 2 L.  Patient also noted to be more sleepy and lethargic.  She has poor appetite.  Husband brought her to the emergency department. Patient seen and examined at bedside this evening.  During evaluation, she was hemodynamically stable, lying in bed.  She was noted to be weak but alert and oriented and answered all the questions, speaking in full sentences.  She says she feels better after coming here. No report of chest pain, abdomen pain, nausea, vomiting, diarrhea, dysuria, hematochezia or melena.  She is having some dry cough.  ED Course: Hemodynamically stable on presentation.  On presentation, she was oxygenating well at 2 L.  ABG showed hypoxia.  Lab work showed sodium of 130.  Chest x-ray showed right upper lobe infiltrate.  Given a dose of azithromycin and ceftriaxone.  Patient was requested for admission for the management of community-acquired pneumonia.  Review of Systems: As per HPI otherwise 10 point review of systems negative.    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     Past Surgical History:  Procedure Laterality Date   CHOLECYSTECTOMY     CHOLECYSTECTOMY, LAPAROSCOPIC     COLONOSCOPY     ESOPHAGOGASTRODUODENOSCOPY     lesion, vulva excision     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   Procedure: PACEMAKER IMPLANT;  Surgeon: [name], MD;  Location: [place] [facility];  Service: Cardiovascular;  Laterality: N/A;   TONSILLECTOMY     VIDEO BRONCHOSCOPY Bilateral 11/06/2017   Procedure: VIDEO BRONCHOSCOPY WITHOUT FLUORO;  Surgeon: Marshell Garfinkel, MD;  Location: [facility];  Service: Cardiopulmonary;  Laterality: Bilateral;     reports that she quit smoking about 2 years ago. Her smoking use included cigarettes. She has a 7.50 pack-year smoking history. She has never used smokeless tobacco. She reports that she does not currently use alcohol after a past usage of about 5.0 standard drinks of alcohol per week. She reports that she does not use drugs.  No Known  Allergies  Family History  Problem Relation Age of Onset   Stroke Mother        Deceased, 14s   Atrial fibrillation Father    Colon cancer Father    Dementia Sister    Other Sister        Pick's disease, deceased   Healthy Daughter      Prior to Admission medications   Medication Sig Start Date End Date Taking? Authorizing Provider  albuterol (VENTOLIN HFA) 108 (90 Base) MCG/ACT inhaler Inhale 1-2 puffs into the lungs every 6 (six) hours as needed for wheezing or shortness of breath. 10/13/20   Byrum,  Rose Fillers, MD  amiodarone (PACERONE) 200 MG tablet Take 1 tablet (200 mg total) by mouth daily. 08/29/21   Elouise Munroe, MD  apixaban (ELIQUIS) 5 MG TABS tablet Take 1 tablet (5 mg total) by mouth 2 (two) times daily. 02/11/22   [name], MD  benzonatate (TESSALON) 200 MG capsule TAKE 1 CAPSULE (200 MG TOTAL) BY MOUTH EVERY 6 (SIX) HOURS AS NEEDED FOR COUGH. 01/25/22   Cobb, Karie Schwalbe, NP  calcium carbonate (OS-CAL - DOSED IN MG OF ELEMENTAL CALCIUM) 1250 (500 Ca) MG tablet Take 1 tablet by mouth every evening.    [provider]  Cholecalciferol (VITAMIN D) 50 MCG (2000 UT) tablet Take 2,000 Units by mouth every evening.    [provider]  empagliflozin (JARDIANCE) 10 MG TABS tablet Take 1 tablet (10 mg total) by mouth daily before breakfast. 09/27/21   Elouise Munroe, MD  fluticasone (FLONASE) 50 MCG/ACT nasal spray SPRAY 2 SPRAYS INTO EACH NOSTRIL EVERY DAY 09/28/21   Hunsucker, Bonna Gains, MD  Fluticasone-Umeclidin-Vilant (TRELEGY ELLIPTA) 100-62.5-25 MCG/ACT AEPB Inhale 1 puff into the lungs daily. 01/04/22   Hunsucker, Bonna Gains, MD  folic acid (FOLVITE) 1 MG tablet Take 1 mg by mouth every evening.    [provider]  furosemide (LASIX) 20 MG tablet TAKE 1 TABLET (20 MG) BY MOUTH DAILY, TAKE 1 EXTRA TABLET BY MOUTH (20 MG) AS NEEDED FOR INCREASED SWELLING. 09/17/21   Elouise Munroe, MD  gabapentin (NEURONTIN) 300 MG capsule TAKE TWO CAPSULES BY MOUTH EVERY MORNING, TWO CAPSULES EACH AFTERNOON AND THREE CAPSULES AT BEDTIME 02/07/22   Biagio Borg, MD  methotrexate 2.5 MG tablet Take 10 mg by mouth every Thursday.    [provider]  metoprolol tartrate (LOPRESSOR) 25 MG tablet Take 1 tablet (25 mg total) by mouth 2 (two) times daily. 11/12/21   Elouise Munroe, MD  Misc Natural Products (ADVANCED JOINT RELIEF) CAPS Take 1 capsule by mouth daily. Joint Advantage Gold 5x    [provider]  Multiple Vitamin (MULTIVITAMIN WITH MINERALS)  TABS tablet Take 1 tablet by mouth daily.    [provider]  predniSONE (DELTASONE) 20 MG tablet Take 2 tablets (40 mg total) by mouth daily. As needed if bronchitis recurs. 02/18/22   Hunsucker, Bonna Gains, MD  RESTASIS 0.05 % ophthalmic emulsion Place 1 drop into both eyes 2 (two) times daily. 07/30/17   [provider]  triamcinolone cream (KENALOG) 0.1 % Apply 1 application. topically 2 (two) times daily. Patient taking differently: Apply 1 application  topically as needed. 09/18/21   Henson, Vickie L, NP-C  vitamin C (ASCORBIC ACID) 500 MG tablet Take 500 mg by mouth daily.    [provider]  vitamin E 180 MG (400 UNITS) capsule Take 400 Units by mouth daily.    [provider]  zolpidem (AMBIEN) 5 MG tablet TAKE 1 TABLET BY MOUTH EVERY DAY AT BEDTIME AS NEEDED FOR SLEEP 11/08/21   Hoyt Koch, MD    Physical Exam: Vitals:   02/21/22 1430 02/21/22 1530 02/21/22 1612 02/21/22 1716  BP: 136/86 136/80 (!) 147/93 (!) 157/92  Pulse: 75 73 80 66  Resp: (!) '23 20 20   '$ Temp:   98.1 F (36.7 C) 98 F (36.7 C)  TempSrc:   Oral Oral  SpO2: 99% 98% 98% 100%    Constitutional: Overall  comfortable, pleasant female, weak Vitals:   02/21/22 1430 02/21/22 1530 02/21/22 1612 02/21/22 1716  BP: 136/86 136/80 (!) 147/93 (!) 157/92  Pulse: 75 73 80 66  Resp: (!) '23 20 20   '$ Temp:   98.1 F (36.7 C) 98 F (36.7 C)  TempSrc:   Oral Oral  SpO2: 99% 98% 98% 100%   Eyes: PERRL, lids and conjunctivae normal ENMT: Mucous membranes are moist.  Neck: normal, supple, no masses, no thyromegaly Respiratory:, some coarse breathing sound, few crackles on the right lung base ,no wheezing. Normal respiratory effort. No accessory muscle use.  Cardiovascular: Regular rate and rhythm, no murmurs / rubs / gallops. No extremity edema. Pacemaker Abdomen: no tenderness, no masses palpated. No hepatosplenomegaly. Bowel sounds positive.  Musculoskeletal: no clubbing /  cyanosis. No joint deformity upper and lower extremities.  Skin: no rashes, lesions, ulcers. No induration Neurologic: CN 2-12 grossly intact.  Strength 5/5 in all 4.  Psychiatric: Normal judgment and insight. Alert and oriented x 3. Normal mood.   Foley Catheter:None  Labs on Admission: I have personally reviewed following labs and imaging studies  CBC: Recent Labs  Lab 02/21/22 1017 02/21/22 1245  WBC 10.1  --   HGB 12.2 11.2*  HCT 36.7 33.0*  MCV 94.3  --   PLT 272  --    Basic Metabolic Panel: Recent Labs  Lab 02/21/22 1017 02/21/22 1245  NA 132* 130*  K 4.3 3.9  CL 96*  --   CO2 26  --   GLUCOSE 106*  --   BUN 18  --   CREATININE 1.06*  --   CALCIUM 9.5  --    GFR: CrCl cannot be calculated (Unknown ideal weight.). Liver Function Tests: No results for input(s): "AST", "ALT", "ALKPHOS", "BILITOT", "PROT", "ALBUMIN" in the last 168 hours. No results for input(s): "LIPASE", "AMYLASE" in the last 168 hours. No results for input(s): "AMMONIA" in the last 168 hours. Coagulation Profile: Recent Labs  Lab 02/21/22 1032  INR 1.5*   Cardiac Enzymes: No results for input(s): "CKTOTAL", "CKMB", "CKMBINDEX", "TROPONINI" in the last 168 hours. BNP (last 3 results) Recent Labs    03/21/21 1206 07/06/21 1425 07/09/21 1242  PROBNP 1,279.0* 1,971.0* 1,033.0*   HbA1C: No results for input(s): "HGBA1C" in the last 72 hours. CBG: No results for input(s): "GLUCAP" in the last 168 hours. Lipid Profile: No results for input(s): "CHOL", "HDL", "LDLCALC", "TRIG", "CHOLHDL", "LDLDIRECT" in the last 72 hours. Thyroid Function Tests: No results for input(s): "TSH", "T4TOTAL", "FREET4", "T3FREE", "THYROIDAB" in the last 72 hours. Anemia Panel: No results for input(s): "VITAMINB12", "FOLATE", "FERRITIN", "TIBC", "IRON", "RETICCTPCT" in the last 72 hours. Urine analysis:    Component Value Date/Time   COLORURINE YELLOW 02/21/2022 1047   APPEARANCEUR HAZY (A) 02/21/2022 1047    LABSPEC 1.010 02/21/2022 1047   PHURINE 6.5 02/21/2022 1047   GLUCOSEU NEGATIVE 02/21/2022 1047   HGBUR NEGATIVE 02/21/2022 1047   BILIRUBINUR  NEGATIVE 02/21/2022 1047   KETONESUR NEGATIVE 02/21/2022 1047   PROTEINUR TRACE (A) 02/21/2022 1047   NITRITE NEGATIVE 02/21/2022 1047   LEUKOCYTESUR NEGATIVE 02/21/2022 1047    Radiological Exams on Admission: DG Chest 2 View  Addendum Date: 02/21/2022   ADDENDUM REPORT: 02/21/2022 12:09 ADDENDUM: By comparison with prior exams, note that the left sided marker on this exam was incorrectly placed on the patient's right side. Electronically Signed   By: Marlaine Hind M.D.   On: 02/21/2022 12:09   Result Date: 02/21/2022 CLINICAL DATA:  Shortness of breath and fatigue. EXAM: CHEST - 2 VIEW COMPARISON:  11/27/2021 FINDINGS: Heart size remains at the upper limits of normal. Permanent pacemaker remains in appropriate position. Asymmetric airspace disease is seen in the right upper lobe, suspicious for pneumonia. Bibasilar interstitial prominence shows no significant change compared to prior exam. No evidence of pleural effusion. IMPRESSION: Asymmetric right upper lobe airspace disease, suspicious for pneumonia. Recommend continued chest radiographic follow-up to confirm resolution. Electronically Signed: By: Marlaine Hind M.D. On: 02/21/2022 11:03     Assessment/Plan Principal Problem:   CAP (community acquired pneumonia) Active Problems:   Systemic lupus erythematosus (HCC)   COPD (chronic obstructive pulmonary disease) (HCC)   Hyponatremia   Acute hypoxemic respiratory failure (HCC)   Chronic diastolic CHF (congestive heart failure) (HCC)   AF (paroxysmal atrial fibrillation) ([place])  Community acquired pneumonia: Presented with dyspnea, cough, fever, low appetite.  Chest x-ray showed possible right upper lobe pneumonia.  Currently on ceftriaxone, azithromycin.  We will check Streptococcus pneumoniae antigen, Legionella antigen.  We will check blood  cultures  Acute on chronic hypoxic respiratory failure: Uses 2 L of oxygen at home as needed but requiring continuous 2 L and not maintaining saturation at home.  Currently on continuous 2 L.  We will try to wean  History of COPD/ILD: Chronically on 2 L of oxygen at home.  Follows with pulmonology for eosinophilic pneumonia. Continue bronchodilators as needed needed, home inhalers  SLE: Takes methotrexate.  History of paroxysmal A-fib: Currently in normal sinus rhythm.  Status post pacemaker for sick sinus syndrome.  On Eliquis for anticoagulation.  Takes metoprolol for rate control.  Chronic diastolic CHF: No evidence of fluid overload currently, might be on the drier side because of poor oral intake.  Started on gentle IV fluids.  Lasix on hold.   Chronic hyponatremia: On reviewing her previous labs, she has chronic hyponatremia.  Currently sodium stable at 130.  Currently on gentle IV fluids  Peripheral neuropathy: On gabapentin  Weakness/deconditioning: We will request a PT evaluation       Severity of Illness: The appropriate patient status for this patient is INPATIENT.   DVT prophylaxis: Eliquis Code Status: Full Family Communication: None at bedside Consults called: None     Shelly Coss MD [facility]  02/21/2022, 5:17 PM

## 2022-02-21 NOTE — ED Notes (Signed)
Patient arrived to the department with complaints of shortness of breath, fatigue, and on/off fevers. Patient has a history of both cardiac and pulmonary diagnoses. Patient is having more difficulty with breathing from baseline and orthopnea per family member. BBS crackles, no wheezing at time of assessment in triage, and a dry cough. RT assessment completed in triage.

## 2022-02-22 ENCOUNTER — Encounter: Payer: Self-pay | Admitting: Pulmonary Disease

## 2022-02-22 ENCOUNTER — Encounter (HOSPITAL_BASED_OUTPATIENT_CLINIC_OR_DEPARTMENT_OTHER): Payer: PPO | Admitting: Internal Medicine

## 2022-02-22 DIAGNOSIS — J189 Pneumonia, unspecified organism: Secondary | ICD-10-CM | POA: Diagnosis not present

## 2022-02-22 LAB — BASIC METABOLIC PANEL
Anion gap: 12 (ref 5–15)
BUN: 21 mg/dL (ref 8–23)
CO2: 22 mmol/L (ref 22–32)
Calcium: 8.3 mg/dL — ABNORMAL LOW (ref 8.9–10.3)
Chloride: 98 mmol/L (ref 98–111)
Creatinine, Ser: 1.22 mg/dL — ABNORMAL HIGH (ref 0.44–1.00)
GFR, Estimated: 47 mL/min — ABNORMAL LOW (ref >60)
Glucose, Bld: 203 mg/dL — ABNORMAL HIGH (ref 70–99)
Potassium: 3.9 mmol/L (ref 3.5–5.1)
Sodium: 132 mmol/L — ABNORMAL LOW (ref 135–145)

## 2022-02-22 LAB — D-DIMER, QUANTITATIVE: D-Dimer, Quant: 0.79 ug/mL-FEU — ABNORMAL HIGH (ref 0.00–0.50)

## 2022-02-22 LAB — CBC
HCT: 34.2 % — ABNORMAL LOW (ref 36.0–46.0)
Hemoglobin: 11.2 g/dL — ABNORMAL LOW (ref 12.0–15.0)
MCH: 31.5 pg (ref 26.0–34.0)
MCHC: 32.7 g/dL (ref 30.0–36.0)
MCV: 96.1 fL (ref 80.0–100.0)
Platelets: 237 10*3/uL (ref 150–400)
RBC: 3.56 MIL/uL — ABNORMAL LOW (ref 3.87–5.11)
RDW: 15.8 % — ABNORMAL HIGH (ref 11.5–15.5)
WBC: 14.3 10*3/uL — ABNORMAL HIGH (ref 4.0–10.5)
nRBC: 0 % (ref 0.0–0.2)

## 2022-02-22 LAB — LEGIONELLA PNEUMOPHILA SEROGP 1 UR AG: L. pneumophila Serogp 1 Ur Ag: NEGATIVE

## 2022-02-22 LAB — GLUCOSE, CAPILLARY: Glucose-Capillary: 198 mg/dL — ABNORMAL HIGH (ref 70–99)

## 2022-02-22 LAB — MAGNESIUM: Magnesium: 1.9 mg/dL (ref 1.7–2.4)

## 2022-02-22 MED ORDER — MAGNESIUM SULFATE 2 GM/50ML IV SOLN
2.0000 g | Freq: Once | INTRAVENOUS | Status: AC
  Administered 2022-02-22: 2 g via INTRAVENOUS
  Filled 2022-02-22: qty 50

## 2022-02-22 MED ORDER — LIP MEDEX EX OINT
TOPICAL_OINTMENT | CUTANEOUS | Status: DC | PRN
  Filled 2022-02-22 (×2): qty 7

## 2022-02-22 MED ORDER — ORAL CARE MOUTH RINSE: 15.0000 mL | OROMUCOSAL | Status: DC | PRN

## 2022-02-22 MED ORDER — POTASSIUM CHLORIDE CRYS ER 20 MEQ PO TBCR
40.0000 meq | EXTENDED_RELEASE_TABLET | Freq: Once | ORAL | Status: AC
  Administered 2022-02-22: 40 meq via ORAL
  Filled 2022-02-22: qty 2

## 2022-02-22 MED ORDER — CHLORHEXIDINE GLUCONATE CLOTH 2 % EX PADS
6.0000 | MEDICATED_PAD | Freq: Every day | CUTANEOUS | Status: DC
  Administered 2022-02-22 – 2022-02-26 (×5): 6 via TOPICAL

## 2022-02-22 MED ORDER — ACETAMINOPHEN 10 MG/ML IV SOLN
1000.0000 mg | Freq: Four times a day (QID) | INTRAVENOUS | Status: AC
  Administered 2022-02-22 (×2): 1000 mg via INTRAVENOUS
  Filled 2022-02-22 (×3): qty 100

## 2022-02-22 NOTE — Progress Notes (Signed)
       CROSS COVER NOTE  NAME: [name] MRN: [id number] DOB : 04/10/1951    Date of Service   02/22/2022   HPI/Events of Note   Notified by bedside RN and rapid response RN with patient with respiratory complication.  This patient was admitted for the management of CAP on azithromycin and rocephin.  She has a complex history of SLE, COPD, CHF, a fib.  Despite previously receiving a small dose of Ativan for anxiety and breathing treatments, patient remains tachypneic and wheezing.  Patient was previously on 2 L nasal cannula and was placed on nonrebreather due to increased work of breathing.  During bedside assessment patient is wheezing despite receiving another breathing treatment.  She is alert and answering questions, but is having difficulty completing sentences due to work of breathing.  She is tachypneic and is noted to use abdominal muscles for breathing.  Patient confirms that she uses a "breathing machine" at home, likely a CPAP.  Patient's temperature increasing to work of breathing.  ABG, chest x-ray, Solu-Medrol were ordered to be given.  Patient will be transferred to progressive unit due to respiratory complication.  Patient to be placed on BiPAP due to increased work of breathing.  ABG pO2-114, likely due to being placed on 10 L nonrebreather.  Chest x-ray shows stable asymmetric diffused pulmonary infiltrates.  No signs of volume overload at this time.  0000-shortly after being transferred to progressive unit, patient's temperature recorded as 103 F. Tylenol helped bring temp back to normal. Appears to be more comfortable with BiPAP on.    Interventions/ Plan   Nebs BiPAP therapy       Raenette Rover, DNP, [place]

## 2022-02-22 NOTE — Plan of Care (Signed)
  Problem: Education: Goal: Knowledge of General Education information will improve Description Including pain rating scale, medication(s)/side effects and non-pharmacologic comfort measures Outcome: Progressing   Problem: Health Behavior/Discharge Planning: Goal: Ability to manage health-related needs will improve Outcome: Progressing   

## 2022-02-22 NOTE — Progress Notes (Signed)
Rapid Response Event Note   Reason for Call : Respiratory distress   Initial Focused Assessment:  WOB increased, use of accessory muscles A&O x4 and follows commands  Air movement in all 4 lung fields. Wheezing heard in upper airway but not lower. Lower lungs rhonchus.   Interventions:  Breathing Tx x2 Solu-medrol IV Transfer to 4th floor for BIPAP  MD Notified: A. Zebedee Iba, NP Call Time: Arrival Time: End Time: Arial, RN

## 2022-02-22 NOTE — Progress Notes (Signed)
PT Cancellation Note  Patient Details Name: Sheila Morales MRN: [id number] DOB: 09-23-1950   Cancelled Treatment:    Reason Eval/Treat Not Completed: Medical issues which prohibited therapy (Per RN, pt being transferred to ICU. PT will follow up as schedule allows and pt appropriate for participation.)   Festus Barren., PT, DPT  Acute Rehabilitation Services  Office [phone]  02/22/2022, 11:41 AM

## 2022-02-22 NOTE — Progress Notes (Signed)
Notified husband of bed change

## 2022-02-22 NOTE — Progress Notes (Signed)
PROGRESS NOTE  Sheila Morales  OXB:[id number] DOB: 04-30-1950 DOA: 02/21/2022 PCP: Hoyt Koch, MD   Brief Narrative: Patient is  Guhl is a 71 y.o. female with medical history significant of COPD, SLE on methotrexate, interstitial lung disease/eosinophilic pneumonia, chronic diastolic CHF, paroxysmal A-fib, sick sinus syndrome status post pacemaker placement, chronic hyponatremia who presented to the emergency department at [facility] with complaint of fever, weakness, dyspnea, cough. Patient uses chronically 2 L of oxygen as needed at home for her COPD/ILD.  Chest x-ray showed right upper lobe infiltrate Patient was admitted for the management of community-acquired pneumonia.   Assessment & Plan:  Principal Problem:   CAP (community acquired pneumonia) Active Problems:   Systemic lupus erythematosus (HCC)   COPD (chronic obstructive pulmonary disease) (HCC)   Hyponatremia   Acute hypoxemic respiratory failure (HCC)   Chronic diastolic CHF (congestive heart failure) (HCC)   AF (paroxysmal atrial fibrillation) ([place])   Community acquired pneumonia: Presented with dyspnea, cough, fever, low appetite.  Chest x-ray showed possible right upper lobe pneumonia.  Currently on ceftriaxone, azithromycin.  Negative Streptococcus pneumoniae antigen, pending Legionella antigen.  We will check blood cultures   Acute on chronic hypoxic respiratory failure: Uses 2 L of oxygen at home as needed but requiring continuous 2 L and not maintaining saturation at home.  Currently on continuous 2 L.  Patient became hypoxic and went to respiratory distress last night, had to put on BiPAP.  This morning she  has been weaned down to nasal cannula.   History of COPD/ILD: Chronically on 2 L of oxygen at home.  Follows with pulmonology for eosinophilic pneumonia. Continue bronchodilators as needed needed, home inhalers   SLE: Takes methotrexate.   History of paroxysmal A-fib: Currently in normal sinus rhythm.   Status post pacemaker for sick sinus syndrome.  On Eliquis for anticoagulation. Takes metoprolol for rate control.   Chronic diastolic CHF: No evidence of fluid overload currently, might be on the drier side because of poor oral intake so was given gentle IV fluids.  Lasix on hold    Chronic hyponatremia: On reviewing her previous labs, she has chronic hyponatremia.  Currently sodium stable at 130s.    Peripheral neuropathy: On gabapentin  Nonsustained V. tach: Magnesium and potassium supplemented.   Weakness/deconditioning: We have requested PT evaluation        DVT prophylaxis: apixaban (ELIQUIS) tablet 5 mg     Code Status: Full Code  Family Communication: Husband at bedside  Patient status:Inpatient  Patient is from :Home  Anticipated discharge AS:TMHD  Estimated DC date:2-3 days   Consultants: None  Procedures:None  Antimicrobials:  Anti-infectives (From admission, onward)    Start     Dose/Rate Route Frequency Ordered Stop   02/22/22 1400  azithromycin (ZITHROMAX) 500 mg in sodium chloride 0.9 % 250 mL IVPB        500 mg 250 mL/hr over 60 Minutes Intravenous Every 24 hours 02/21/22 1713 02/26/22 1359   02/22/22 1200  cefTRIAXone (ROCEPHIN) 1 g in sodium chloride 0.9 % 100 mL IVPB  Status:  Discontinued        1 g 200 mL/hr over 30 Minutes Intravenous  Once 02/21/22 1713 02/21/22 1717   02/22/22 1200  cefTRIAXone (ROCEPHIN) 2 g in sodium chloride 0.9 % 100 mL IVPB        2 g 200 mL/hr over 30 Minutes Intravenous Every 24 hours 02/21/22 1713 02/26/22 1159   02/21/22 1815  cefTRIAXone (ROCEPHIN) 1 g in sodium  chloride 0.9 % 100 mL IVPB        1 g 200 mL/hr over 30 Minutes Intravenous  Once 02/21/22 1717 02/22/22 0845   02/21/22 1215  cefTRIAXone (ROCEPHIN) 1 g in sodium chloride 0.9 % 100 mL IVPB        1 g 200 mL/hr over 30 Minutes Intravenous  Once 02/21/22 1202 02/21/22 1251   02/21/22 1215  azithromycin (ZITHROMAX) 500 mg in sodium chloride 0.9 % 250 mL  IVPB        500 mg 250 mL/hr over 60 Minutes Intravenous  Once 02/21/22 1202 02/21/22 1449       Subjective: Patient seen and examined the bedside today.  She went into respiratory distress and had to be put on BiPAP last night.  During evaluation, she was nasal cannula 2 to 3 L.  She states she feels better.  She was speaking in full sentences.  Husband at bedside.  Long discussion done at the bedside about her clinical condition.  Objective: Vitals:   02/22/22 0525 02/22/22 0800 02/22/22 0824 02/22/22 1040  BP: 123/80 132/89    Pulse: 78 74    Resp: 16 20    Temp: (!) 97.5 F (36.4 C)   (!) 95.4 F (35.2 C)  TempSrc: Oral   Rectal  SpO2: 100% 97% 98%   Height:        Intake/Output Summary (Last 24 hours) at 02/22/2022 1104 Last data filed at 02/21/2022 1449 Gross per 24 hour  Intake 350 ml  Output --  Net 350 ml   There were no vitals filed for this visit.  Examination:  General exam: Overall comfortable, not in distress,weak looking HEENT: PERRL Respiratory system:  no wheezes , coarse breath sounds bilaterally, some crackles on the left lung base Cardiovascular system: S1s or crackle & S2 heard, RRR.  Gastrointestinal system: Abdomen is nondistended, soft and nontender. Central nervous system: Alert and oriented Extremities: No edema, no clubbing ,no cyanosis Skin: No rashes, no ulcers,no icterus     Data Reviewed: I have personally reviewed following labs and imaging studies  CBC: Recent Labs  Lab 02/21/22 1017 02/21/22 1245 02/22/22 0248  WBC 10.1  --  14.3*  HGB 12.2 11.2* 11.2*  HCT 36.7 33.0* 34.2*  MCV 94.3  --  96.1  PLT 272  --  458   Basic Metabolic Panel: Recent Labs  Lab 02/21/22 1017 02/21/22 1245 02/22/22 0248  NA 132* 130* 132*  K 4.3 3.9 3.9  CL 96*  --  98  CO2 26  --  22  GLUCOSE 106*  --  203*  BUN 18  --  21  CREATININE 1.06*  --  1.22*  CALCIUM 9.5  --  8.3*     Recent Results (from the past 240 hour(s))  Culture,  blood (Routine x 2)     Status: None (Preliminary result)   Collection Time: 02/21/22 10:32 AM   Specimen: BLOOD  Result Value Ref Range Status   Specimen Description   Final    BLOOD RIGHT ANTECUBITAL Performed at [facility], [place] [address], [place], [place] [address]    Special Requests   Final    BOTTLES DRAWN AEROBIC AND ANAEROBIC Blood Culture adequate volume Performed at [facility], [address], [place], [place] [address]    Culture   Final    NO GROWTH < 24 HOURS Performed at [facility], [place] [address] [place], [place] [address]  Report Status PENDING  Incomplete  SARS Coronavirus 2 by RT PCR (hospital order, performed in [facility] hospital lab) *cepheid single result test* Anterior Nasal Swab     Status: None   Collection Time: 02/21/22 11:34 AM   Specimen: Anterior Nasal Swab  Result Value Ref Range Status   SARS Coronavirus 2 by RT PCR NEGATIVE NEGATIVE Final    Comment: (NOTE) SARS-CoV-2 target nucleic acids are NOT DETECTED.  The SARS-CoV-2 RNA is generally detectable in upper and lower respiratory specimens during the acute phase of infection. The lowest concentration of SARS-CoV-2 viral copies this assay can detect is 250 copies / mL. A negative result does not preclude SARS-CoV-2 infection and should not be used as the sole basis for treatment or other patient management decisions.  A negative result may occur with improper specimen collection / handling, submission of specimen other than nasopharyngeal swab, presence of viral mutation(s) within the areas targeted by this assay, and inadequate number of viral copies (<250 copies / mL). A negative result must be combined with clinical observations, patient history, and epidemiological information.  Fact Sheet for Patients:   https://www.patel.info/  Fact Sheet for Healthcare Providers: https://hall.com/  This test is not  yet approved or  cleared by the [place] FDA and has been authorized for detection and/or diagnosis of SARS-CoV-2 by FDA under an Emergency Use Authorization (EUA).  This EUA will remain in effect (meaning this test can be used) for the duration of the COVID-19 declaration under Section 564(b)(1) of the Act, 21 U.S.C. section 360bbb-3(b)(1), unless the authorization is terminated or revoked sooner.  Performed at [facility], [address] [place], [place] [address]   Culture, blood (Routine x 2)     Status: None (Preliminary result)   Collection Time: 02/21/22 11:34 AM   Specimen: BLOOD  Result Value Ref Range Status   Specimen Description   Final    BLOOD BLOOD LEFT HAND Performed at [facility], [address], [place], [place] [address]    Special Requests   Final    BOTTLES DRAWN AEROBIC AND ANAEROBIC Blood Culture adequate volume Performed at [facility], [address] [place], [place] [address]    Culture   Final    NO GROWTH < 24 HOURS Performed at [facility], [place] [address], [place], [place] [address]    Report Status PENDING  Incomplete     Radiology Studies: DG Chest Port 1 View  Result Date: 02/22/2022 CLINICAL DATA:  Respiratory distress EXAM: PORTABLE CHEST 1 VIEW COMPARISON:  10:53 a.m. FINDINGS: The lungs are symmetrically expanded. Superimposed diffuse interstitial and airspace infiltrate is present, more focal within the right upper lobe, stable since prior examination in keeping with atypical infection or asymmetric pulmonary edema. No pneumothorax or pleural effusion. Cardiac size is mildly enlarged. Left subclavian dual lead pacemaker is unchanged. No acute bone abnormality. IMPRESSION: 1. Stable asymmetric diffuse pulmonary infiltrate, in keeping with atypical infection or asymmetric pulmonary edema. 2. Mild cardiomegaly. Electronically Signed   By: Fidela Salisbury M.D.   On:  02/22/2022 00:24   DG Chest 2 View  Addendum Date: 02/21/2022   ADDENDUM REPORT: 02/21/2022 12:09 ADDENDUM: By comparison with prior exams, note that the left sided marker on this exam was incorrectly placed on the patient's right side. Electronically Signed   By: Marlaine Hind M.D.   On: 02/21/2022 12:09   Result Date: 02/21/2022 CLINICAL DATA:  Shortness of breath and fatigue. EXAM: CHEST -  2 VIEW COMPARISON:  11/27/2021 FINDINGS: Heart size remains at the upper limits of normal. Permanent pacemaker remains in appropriate position. Asymmetric airspace disease is seen in the right upper lobe, suspicious for pneumonia. Bibasilar interstitial prominence shows no significant change compared to prior exam. No evidence of pleural effusion. IMPRESSION: Asymmetric right upper lobe airspace disease, suspicious for pneumonia. Recommend continued chest radiographic follow-up to confirm resolution. Electronically Signed: By: Marlaine Hind M.D. On: 02/21/2022 11:03    Scheduled Meds:  amiodarone  200 mg Oral QPM   apixaban  5 mg Oral BID   ascorbic acid  500 mg Oral Daily   cholecalciferol  4,000 Units Oral QPM   empagliflozin  10 mg Oral QAC breakfast   fluticasone  1 spray Each Nare Daily   fluticasone furoate-vilanterol  1 puff Inhalation Daily   And   umeclidinium bromide  1 puff Inhalation Daily   folic acid  1 mg Oral QPM   gabapentin  300 mg Oral TID   metoprolol tartrate  25 mg Oral BID   potassium chloride  40 mEq Oral Once   vitamin E  400 Units Oral Daily   Continuous Infusions:  sodium chloride 10 mL/hr at 02/21/22 1220   acetaminophen 1,000 mg (02/22/22 0613)   azithromycin (ZITHROMAX) 500 mg in sodium chloride 0.9 % 250 mL IVPB     cefTRIAXone (ROCEPHIN)  IV       LOS: 1 day   Shelly Coss, MD [facility] P11/06/2021, 11:04 AM

## 2022-02-23 DIAGNOSIS — J441 Chronic obstructive pulmonary disease with (acute) exacerbation: Secondary | ICD-10-CM

## 2022-02-23 DIAGNOSIS — J9621 Acute and chronic respiratory failure with hypoxia: Secondary | ICD-10-CM

## 2022-02-23 DIAGNOSIS — J189 Pneumonia, unspecified organism: Secondary | ICD-10-CM | POA: Diagnosis not present

## 2022-02-23 LAB — BASIC METABOLIC PANEL
Anion gap: 7 (ref 5–15)
BUN: 21 mg/dL (ref 8–23)
CO2: 22 mmol/L (ref 22–32)
Calcium: 9 mg/dL (ref 8.9–10.3)
Chloride: 103 mmol/L (ref 98–111)
Creatinine, Ser: 0.82 mg/dL (ref 0.44–1.00)
GFR, Estimated: >60 mL/min (ref >60)
Glucose, Bld: 128 mg/dL — ABNORMAL HIGH (ref 70–99)
Potassium: 5 mmol/L (ref 3.5–5.1)
Sodium: 132 mmol/L — ABNORMAL LOW (ref 135–145)

## 2022-02-23 LAB — CBC
HCT: 32.2 % — ABNORMAL LOW (ref 36.0–46.0)
Hemoglobin: 10.4 g/dL — ABNORMAL LOW (ref 12.0–15.0)
MCH: 31.1 pg (ref 26.0–34.0)
MCHC: 32.3 g/dL (ref 30.0–36.0)
MCV: 96.4 fL (ref 80.0–100.0)
Platelets: 261 10*3/uL (ref 150–400)
RBC: 3.34 MIL/uL — ABNORMAL LOW (ref 3.87–5.11)
RDW: 15.9 % — ABNORMAL HIGH (ref 11.5–15.5)
WBC: 19.5 10*3/uL — ABNORMAL HIGH (ref 4.0–10.5)
nRBC: 0 % (ref 0.0–0.2)

## 2022-02-23 LAB — GLUCOSE, CAPILLARY: Glucose-Capillary: 104 mg/dL — ABNORMAL HIGH (ref 70–99)

## 2022-02-23 LAB — MAGNESIUM: Magnesium: 2.3 mg/dL (ref 1.7–2.4)

## 2022-02-23 LAB — PROCALCITONIN: Procalcitonin: 3.47 ng/mL

## 2022-02-23 MED ORDER — ARFORMOTEROL TARTRATE 15 MCG/2ML IN NEBU
15.0000 ug | INHALATION_SOLUTION | Freq: Two times a day (BID) | RESPIRATORY_TRACT | Status: DC
  Administered 2022-02-23 – 2022-03-02 (×15): 15 ug via RESPIRATORY_TRACT
  Filled 2022-02-23 (×14): qty 2

## 2022-02-23 MED ORDER — PREDNISONE 20 MG PO TABS
40.0000 mg | ORAL_TABLET | Freq: Every day | ORAL | Status: DC
  Administered 2022-02-23 – 2022-02-24 (×2): 40 mg via ORAL
  Filled 2022-02-23 (×2): qty 2

## 2022-02-23 MED ORDER — REVEFENACIN 175 MCG/3ML IN SOLN
175.0000 ug | Freq: Every day | RESPIRATORY_TRACT | Status: DC
  Administered 2022-02-23 – 2022-03-02 (×8): 175 ug via RESPIRATORY_TRACT
  Filled 2022-02-23 (×8): qty 3

## 2022-02-23 MED ORDER — BUDESONIDE 0.5 MG/2ML IN SUSP
0.5000 mg | Freq: Two times a day (BID) | RESPIRATORY_TRACT | Status: DC
  Administered 2022-02-23 – 2022-03-02 (×15): 0.5 mg via RESPIRATORY_TRACT
  Filled 2022-02-23 (×15): qty 2

## 2022-02-23 MED ORDER — ACETAMINOPHEN 325 MG PO TABS
650.0000 mg | ORAL_TABLET | Freq: Four times a day (QID) | ORAL | Status: DC | PRN
  Administered 2022-02-23 – 2022-03-02 (×5): 650 mg via ORAL
  Filled 2022-02-23 (×5): qty 2

## 2022-02-23 NOTE — Evaluation (Signed)
Physical Therapy Evaluation Patient Details Name: Sheila Morales MRN: [id number] DOB: Dec 01, 1950 Today's Date: 02/23/2022  History of Present Illness  Pt admitted from home with acute hypoxemis respiratory failure 2* CAP.  PT with hx of CHF, COPD, A-fib, SSS s/p pacemaker, SLE, peripheral neuropathy, and chronic hyponatremia  Clinical Impression  Pt admitted as above and presenting with functional mobility limitations 2* generalized weakness, ambulatory balance deficits and decreased endurance. Pt should progress to dc home with assist of family and plans to continue with her ongoing OP PT to further address deficits.     Recommendations for follow up therapy are one component of a multi-disciplinary discharge planning process, led by the attending physician.  Recommendations may be updated based on patient status, additional functional criteria and insurance authorization.  Follow Up Recommendations Outpatient PT (Pt is currently on case load with OP PT and plans to continue)      Assistance Recommended at Discharge Intermittent Supervision/Assistance  Patient can return home with the following  A little help with walking and/or transfers;A little help with bathing/dressing/bathroom;Assist for transportation;Help with stairs or ramp for entrance;Assistance with cooking/housework    Equipment Recommendations None recommended by PT  Recommendations for Other Services       Functional Status Assessment Patient has had a recent decline in their functional status and demonstrates the ability to make significant improvements in function in a reasonable and predictable amount of time.     Precautions / Restrictions Precautions Precautions: Fall Restrictions Weight Bearing Restrictions: No      Mobility  Bed Mobility Overal bed mobility: Modified Independent             General bed mobility comments: Pt to EOB sitting unassisted    Transfers Overall transfer level: Needs  assistance Equipment used: Rolling walker (2 wheels) Transfers: Sit to/from Stand Sit to Stand: Min guard           General transfer comment: Steady assist only    Ambulation/Gait Ambulation/Gait assistance: Min guard Gait Distance (Feet): 100 Feet (100' twice) Assistive device: Rolling walker (2 wheels) Gait Pattern/deviations: Step-to pattern, Step-through pattern, Decreased step length - right, Decreased step length - left, Shuffle, Trunk flexed Gait velocity: mod pace     General Gait Details: cues for posture, pace and position from [employer] Mobility    Modified Rankin (Stroke Patients Only)       Balance Overall balance assessment: Needs assistance Sitting-balance support: Feet supported, No upper extremity supported Sitting balance-Leahy Scale: Good     Standing balance support: No upper extremity supported Standing balance-Leahy Scale: Fair                               Pertinent Vitals/Pain Pain Assessment Pain Assessment: No/denies pain    Home Living Family/patient expects to be discharged to:: Private residence Living Arrangements: Spouse/significant other Available Help at Discharge: Family;Available 24 hours/day Type of Home: House Home Access: Stairs to enter Entrance Stairs-Rails: None Entrance Stairs-Number of Steps: 2   Home Layout: One level Home Equipment: [employer] aid/development worker (2 wheels);Cane - single point;Hand held shower head;Grab bars - toilet 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, hasn't resumed driving    Prior Function Prior Level of Function : Independent/Modified Independent             Mobility Comments: walks with RW prn ADLs Comments:  independent, sometimes uses shower seat     Hand Dominance   Dominant Hand: Right    Extremity/Trunk Assessment   Upper Extremity Assessment Upper Extremity Assessment: Generalized weakness    Lower Extremity  Assessment Lower Extremity Assessment: Generalized weakness       Communication   Communication: HOH  Cognition Arousal/Alertness: Awake/alert Behavior During Therapy: WFL for tasks assessed/performed, Impulsive Overall Cognitive Status: Within Functional Limits for tasks assessed                                          General Comments      Exercises     Assessment/Plan    PT Assessment Patient needs continued PT services  PT Problem List Decreased strength;Decreased activity tolerance;Decreased balance;Decreased mobility;Decreased knowledge of use of DME       PT Treatment Interventions DME instruction;Gait training;Functional mobility training;Therapeutic activities;Therapeutic exercise;Patient/family education    PT Goals (Current goals can be found in the Care Plan section)  Acute Rehab PT Goals Patient Stated Goal: HOME PT Goal Formulation: With patient Time For Goal Achievement: 03/09/22 Potential to Achieve Goals: Good    Frequency Min 3X/week     Co-evaluation               AM-PAC PT "6 Clicks" Mobility  Outcome Measure Help needed turning from your back to your side while in a flat bed without using bedrails?: None Help needed moving from lying on your back to sitting on the side of a flat bed without using bedrails?: None Help needed moving to and from a bed to a chair (including a wheelchair)?: A Little Help needed standing up from a chair using your arms (e.g., wheelchair or bedside chair)?: A Little Help needed to walk in hospital room?: A Little Help needed climbing 3-5 steps with a railing? : A Little 6 Click Score: 20    End of Session Equipment Utilized During Treatment: Gait belt;Oxygen Activity Tolerance: Patient tolerated treatment well;Patient limited by fatigue Patient left: in chair;with call bell/phone within reach;with chair alarm set;with family/visitor present;with nursing/sitter in room Nurse Communication:  Mobility status PT Visit Diagnosis: Unsteadiness on feet (R26.81);Difficulty in walking, not elsewhere classified (R26.2);Muscle weakness (generalized) (M62.81)    Time: 3810-1751 PT Time Calculation (min) (ACUTE ONLY): 22 min   Charges:   PT Evaluation $PT Eval Low Complexity: 1 Low          [place] Pager [phone] Office [phone]   Prabhleen Montemayor 02/23/2022, 3:40 PM

## 2022-02-23 NOTE — Plan of Care (Signed)

## 2022-02-23 NOTE — Consult Note (Signed)
NAME:  Sheila Morales, MRN:  [id number], DOB:  05/26/1950, LOS: 2 ADMISSION DATE:  02/21/2022, CONSULTATION DATE:  02/23/22 REFERRING MD:  Dr. Tawanna Solo, [facility], CHIEF COMPLAINT:  Short of breath   History of Present Illness:  71 yo female former smoker presented to ER with fever (Tm 103.1), chest congestion, shortness of breath and malaise.  SpO2 88% on 2 liters in ER.  She was started on antibiotics for pneumonia.  She is followed in the pulmonary office by Dr. Silas Flood for COPD and eosinophilic pneumonia.  She is on MTX as an outpt for SLE.  PCCM consulted to assist with respiratory management.  Pertinent  Medical History  A fib, CHF, Ischemic colitis, COPD, Rheumatic fever, IBS, Pacemaker, Colon polyp, RLS, Cutaneous lupus, Chronic respiratory failure on 2 liters oxygen, Tremor, Memory deficit  Significant Hospital Events: Including procedures, antibiotic start and stop dates in addition to other pertinent events   11/02 Admit, start ABx 11/03 increased dyspnea, transfer to SDU and start on Bipap  Studies:  PFT 02/06/17 >> FEV1 1.30 (57%), FEV1% 59, TLC 4.65 (94%), DLCO 54% CT chest 04/24/21 >> b/l upper lobe GGO, emphysema, early subpleural honeycombing Echo 01/10/22 >> EF 65 to 70%, mild LVH, RVSP 26.6 mmHg, mild/mod MR  Interim History / Subjective:  She was assisting in her brother's healthcare.  He unfortunately passed away about 1 week ago.  She was running around a lot, and wasn't keeping up with her health needs.  She started feeling sick around this time.  Objective   Blood pressure (!) 154/73, pulse 66, temperature (!) 97.3 F (36.3 C), temperature source Oral, resp. rate 18, height '5\' 2"'$  (1.575 m), SpO2 100 %.        Intake/Output Summary (Last 24 hours) at 02/23/2022 0849 Last data filed at 02/23/2022 0744 Gross per 24 hour  Intake 970.28 ml  Output 500 ml  Net 470.28 ml   There were no vitals filed for this visit.  Examination:  General - alert Eyes - pupils  reactive ENT - no sinus tenderness, no stridor Cardiac - regular rate/rhythm, no murmur Chest - b/l crackles Abdomen - soft, non tender, + bowel sounds Extremities - no cyanosis, clubbing, or edema Skin - no rashes Neuro - normal strength, moves extremities, follows commands Psych - normal mood and behavior  Resolved Hospital Problem list     Assessment & Plan:   Acute on chronic hypoxic respiratory failure. - from pneumonia with hx of COPD and ILD - goal SpO2 > 92%  Community acquired pneumonia Rt upper lobe. - Abx day 3 - f/u CXR intermittently  COPD with emphysema with acute exacerbation. 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 - don't think she has exacerbation of ILD at this time  Hx of cutaneous lupus with positive ANA, SSA/SSB. - plaquenil recently stopped due concern about cardiomyopathy - hold MTX in setting of infection; normally takes this on Thursdays - followed by Dr. Gavin Pound with rheumatology as outpt  Paroxysmal atrial fibrillation on eliquis. Chronic HFpEF. Chronic hyponatremia. Peripheral neuropathy. - per primary team  Best Practice (right click and "Reselect all SmartList Selections" daily)   Diet/type: Regular consistency (see orders) DVT prophylaxis: DOAC GI prophylaxis: N/A Lines: N/A Foley:  N/A Code Status:  full code Last date of multidisciplinary goals of care discussion [updated her husband at bedside]  Labs   CBC: Recent  Labs  Lab 02/21/22 1017 02/21/22 1245 02/22/22 0248 02/23/22 0247  WBC 10.1  --  14.3* 19.5*  HGB 12.2 11.2* 11.2* 10.4*  HCT 36.7 33.0* 34.2* 32.2*  MCV 94.3  --  96.1 96.4  PLT 272  --  237 440    Basic Metabolic Panel: Recent Labs  Lab 02/21/22 1017 02/21/22 1245 02/22/22 0248 02/23/22 0247  NA 132* 130* 132* 132*  K 4.3 3.9 3.9 5.0  CL 96*  --  98 103  CO2 26  --  22 22  GLUCOSE  106*  --  203* 128*  BUN 18  --  21 21  CREATININE 1.06*  --  1.22* 0.82  CALCIUM 9.5  --  8.3* 9.0  MG  --   --  1.9 2.3   GFR: CrCl cannot be calculated (Unknown ideal weight.). Recent Labs  Lab 02/21/22 1017 02/21/22 1032 02/22/22 0248 02/23/22 0247  WBC 10.1  --  14.3* 19.5*  LATICACIDVEN  --  1.6  --   --     Liver Function Tests: No results for input(s): "AST", "ALT", "ALKPHOS", "BILITOT", "PROT", "ALBUMIN" in the last 168 hours. No results for input(s): "LIPASE", "AMYLASE" in the last 168 hours. No results for input(s): "AMMONIA" in the last 168 hours.  ABG    Component Value Date/Time   PHART 7.41 02/21/2022 2346   PCO2ART 33 02/21/2022 2346   PO2ART 114 (H) 02/21/2022 2346   HCO3 20.8 02/21/2022 2346   TCO2 24 02/21/2022 1245   ACIDBASEDEF 2.8 (H) 02/21/2022 2346   O2SAT 99.7 02/21/2022 2346     Coagulation Profile: Recent Labs  Lab 02/21/22 1032  INR 1.5*    Cardiac Enzymes: No results for input(s): "CKTOTAL", "CKMB", "CKMBINDEX", "TROPONINI" in the last 168 hours.  HbA1C: Hgb A1c MFr Bld  Date/Time Value Ref Range Status  02/24/2017 01:51 PM 5.7 4.6 - 6.5 % Final    Comment:    Glycemic Control Guidelines for People with Diabetes:Non Diabetic:  <6%Goal of Therapy: <7%Additional Action Suggested:  >8%     CBG: Recent Labs  Lab 02/22/22 1204 02/23/22 0751  GLUCAP 198* 104*    Review of Systems:   Reviewed and negative.  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   Surgical  History:   Past Surgical History:  Procedure Laterality Date   CHOLECYSTECTOMY     CHOLECYSTECTOMY, LAPAROSCOPIC     COLONOSCOPY     ESOPHAGOGASTRODUODENOSCOPY     lesion, vulva excision     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   Procedure: PACEMAKER IMPLANT;  Surgeon: [name], MD;  Location: [facility];  Service: Cardiovascular;  Laterality: N/A;   TONSILLECTOMY     VIDEO BRONCHOSCOPY Bilateral 11/06/2017   Procedure: VIDEO BRONCHOSCOPY WITHOUT FLUORO;  Surgeon: Marshell Garfinkel, MD;  Location: [facility];  Service: Cardiopulmonary;  Laterality: Bilateral;     Social History:   reports that she quit smoking about 2 years ago. Her smoking use included cigarettes. She has a 7.50 pack-year smoking history. She has never used smokeless tobacco. She reports that she does not currently use alcohol after a  past usage of about 5.0 standard drinks of alcohol per week. She reports that she does not use drugs.   Family History:  Her family history includes Atrial fibrillation in her father; Colon cancer in her father; Dementia in her sister; Healthy in her daughter; Other in her sister; Stroke in her mother.   Allergies No Known Allergies   Home Medications  Prior to Admission medications   Medication Sig Start Date End Date Taking? Authorizing Provider  acetaminophen (TYLENOL) 325 MG tablet Take 650 mg by mouth every 6 (six) hours as needed for moderate pain or mild pain.   Yes [provider]  albuterol (VENTOLIN HFA) 108 (90 Base) MCG/ACT inhaler Inhale 1-2 puffs into the lungs every 6 (six) hours as needed for wheezing or shortness of breath. 10/13/20  Yes Byrum, Rose Fillers, MD  amiodarone (PACERONE) 200 MG tablet Take 1 tablet (200 mg total) by mouth daily. Patient taking differently: Take 200 mg by mouth every evening. 08/29/21  Yes Elouise Munroe, MD  apixaban (ELIQUIS) 5 MG TABS tablet Take 1 tablet (5 mg total) by mouth 2 (two) times daily. 02/11/22  Yes [name], MD  benzonatate (TESSALON) 200 MG capsule TAKE 1 CAPSULE (200 MG TOTAL) BY MOUTH EVERY 6 (SIX) HOURS AS NEEDED FOR COUGH. 01/25/22  Yes Cobb, Karie Schwalbe, NP  calcium carbonate (OS-CAL - DOSED IN MG OF ELEMENTAL CALCIUM) 1250 (500 Ca) MG tablet Take 1 tablet by mouth every evening.   Yes [provider]  Cholecalciferol (VITAMIN D) 50 MCG (2000 UT) tablet Take 4,000 Units by mouth every evening.   Yes [provider]  empagliflozin (JARDIANCE) 10 MG TABS tablet Take 1 tablet (10 mg total) by mouth daily before breakfast. 09/27/21  Yes Elouise Munroe, MD  fluticasone (FLONASE) 50 MCG/ACT nasal spray SPRAY 2 SPRAYS INTO EACH NOSTRIL EVERY DAY 09/28/21  Yes Hunsucker, Bonna Gains, MD  Fluticasone-Umeclidin-Vilant (TRELEGY ELLIPTA) 100-62.5-25 MCG/ACT AEPB Inhale 1 puff into the lungs daily. 01/04/22  Yes Hunsucker, Bonna Gains, MD  folic acid (FOLVITE) 1 MG tablet Take 1 mg by mouth every evening.   Yes [provider]  furosemide (LASIX) 20 MG tablet TAKE 1 TABLET (20 MG) BY MOUTH DAILY, TAKE 1 EXTRA TABLET BY MOUTH (20 MG) AS NEEDED FOR INCREASED SWELLING. Patient taking differently: Take 20 mg by mouth See admin instructions. TAKE 1 TABLET (20 MG) BY MOUTH DAILY, TAKE 1 EXTRA TABLET BY MOUTH (20 MG) AS NEEDED FOR INCREASED SWELLING. 09/17/21  Yes Elouise Munroe, MD  gabapentin (NEURONTIN) 300 MG capsule TAKE TWO CAPSULES BY MOUTH EVERY MORNING, TWO CAPSULES EACH AFTERNOON AND THREE CAPSULES AT BEDTIME Patient taking differently: Take 900 mg by mouth at bedtime as needed (leg pain). 02/07/22  Yes Biagio Borg, MD  methotrexate 2.5 MG tablet Take 10 mg by mouth every Thursday.   Yes [provider]  metoprolol tartrate (LOPRESSOR) 25 MG tablet Take 1 tablet (25 mg total) by mouth 2 (two) times daily. 11/12/21  Yes Elouise Munroe, MD  Multiple Vitamin (MULTIVITAMIN WITH MINERALS) TABS tablet Take 1 tablet by mouth daily.   Yes [provider]   predniSONE (DELTASONE) 20 MG tablet Take 2 tablets (40 mg total) by mouth daily. As needed if bronchitis recurs. 02/18/22  Yes Hunsucker, Bonna Gains, MD  PRESCRIPTION MEDICATION Take 1 tablet by mouth in the morning and at bedtime. Sodium Bicarbonate   Yes [provider]  RESTASIS 0.05 % ophthalmic emulsion Place  1 drop into both eyes 2 (two) times daily. 07/30/17  Yes [provider]  triamcinolone cream (KENALOG) 0.1 % Apply 1 application. topically 2 (two) times daily. Patient taking differently: Apply 1 application  topically as needed. 09/18/21  Yes Henson, Vickie L, NP-C  vitamin C (ASCORBIC ACID) 500 MG tablet Take 500 mg by mouth daily.   Yes [provider]  vitamin E 180 MG (400 UNITS) capsule Take 400 Units by mouth daily.   Yes [provider]  zolpidem (AMBIEN) 5 MG tablet TAKE 1 TABLET BY MOUTH EVERY DAY AT BEDTIME AS NEEDED FOR SLEEP Patient taking differently: Take 5 mg by mouth at bedtime as needed for sleep. 11/08/21  Yes Hoyt Koch, MD  hydroxychloroquine (PLAQUENIL) 200 MG tablet Take 200 mg by mouth 2 (two) times daily. Patient not taking: Reported on 02/21/2022 02/08/22   [provider]  Misc Natural Products (ADVANCED JOINT RELIEF) CAPS Take 1 capsule by mouth daily. Joint Advantage Gold 5x Patient not taking: Reported on 02/21/2022    [provider]     Signature:  Chesley Mires, MD [place] Pager - [phone] 02/23/2022, 10:09 AM

## 2022-02-23 NOTE — Progress Notes (Signed)
PROGRESS NOTE  [name]  IRC:[id number] DOB: Aug 16, 1950 DOA: 02/21/2022 PCP: Hoyt Koch, MD   Brief Narrative: Patient is  Sheila Morales is a 71 y.o. female with medical history significant of COPD, SLE on methotrexate, interstitial lung disease/eosinophilic pneumonia, chronic diastolic CHF, paroxysmal A-fib, sick sinus syndrome status post pacemaker placement, chronic hyponatremia who presented to the emergency department at [facility] with complaint of fever, weakness, dyspnea, cough. Patient uses chronically 2 L of oxygen as needed at home for her COPD/ILD.  Chest x-ray showed right upper lobe infiltrate Patient was admitted for the management of community-acquired pneumonia.  Patient transferred to stepdown on 11/3 for hypothermia/respiratory  distress.  PCCM also following  Assessment & Plan:  Principal Problem:   CAP (community acquired pneumonia) Active Problems:   Systemic lupus erythematosus (HCC)   COPD (chronic obstructive pulmonary disease) (HCC)   Hyponatremia   Acute hypoxemic respiratory failure (HCC)   Chronic diastolic CHF (congestive heart failure) (HCC)   AF (paroxysmal atrial fibrillation) ([place])   Community acquired pneumonia: Presented with dyspnea, cough, fever, low appetite.  Chest x-ray showed possible right upper lobe pneumonia.  Currently on ceftriaxone, azithromycin.  Negative Streptococcus pneumoniae antigen,  Legionella antigen.  Blood culture negative so far  Sepsis: Has leukocytosis, elevated procalcitonin.  Continue current antibiotics.  Follow-up cultures   Acute on chronic hypoxic respiratory failure: Uses 2 L of oxygen at home as needed but requiring continuous 2 L and not maintaining saturation at home.  Currently on continuous 2 -3L.  Patient became hypoxic and went to respiratory distress on the night of 11/3, had to put on BiPAP.  She has been weaned down to nasal cannula.   History of COPD/ILD: Chronically on 2 L of oxygen at home as needed.   Follows with pulmonology for eosinophilic pneumonia. Continue bronchodilators as needed needed, home inhalers.  Continue prednisone   SLE: Takes methotrexate.   History of paroxysmal A-fib: Currently in normal sinus rhythm.  Status post pacemaker for sick sinus syndrome.  On Eliquis for anticoagulation. Takes metoprolol for rate control.   Chronic diastolic CHF: No evidence of fluid overload currently, might be on the drier side because of poor oral intake so was given gentle IV fluids, now stopped.  Lasix on hold    Chronic hyponatremia: On reviewing her previous labs, she has chronic hyponatremia.  Currently sodium stable at 130s.    Peripheral neuropathy: On gabapentin  Nonsustained V. tach: Magnesium and potassium supplemented.   Weakness/deconditioning: We have requested PT evaluation,recommended HH        DVT prophylaxis: apixaban (ELIQUIS) tablet 5 mg     Code Status: Full Code  Family Communication: Husband at bedside  Patient status:Inpatient  Patient is from :Home  Anticipated discharge PZ:[facility]  Estimated DC date:2-3 days   Consultants: PCCM  Procedures:None  Antimicrobials:  Anti-infectives (From admission, onward)    Start     Dose/Rate Route Frequency Ordered Stop   02/22/22 1400  azithromycin (ZITHROMAX) 500 mg in sodium chloride 0.9 % 250 mL IVPB        500 mg 250 mL/hr over 60 Minutes Intravenous Every 24 hours 02/21/22 1713 02/26/22 1359   02/22/22 1200  cefTRIAXone (ROCEPHIN) 1 g in sodium chloride 0.9 % 100 mL IVPB  Status:  Discontinued        1 g 200 mL/hr over 30 Minutes Intravenous  Once 02/21/22 1713 02/21/22 1717   02/22/22 1200  cefTRIAXone (ROCEPHIN) 2 g in sodium chloride 0.9 % 100  mL IVPB        2 g 200 mL/hr over 30 Minutes Intravenous Every 24 hours 02/21/22 1713 02/26/22 1159   02/21/22 1815  cefTRIAXone (ROCEPHIN) 1 g in sodium chloride 0.9 % 100 mL IVPB        1 g 200 mL/hr over 30 Minutes Intravenous  Once 02/21/22 1717  02/22/22 0845   02/21/22 1215  cefTRIAXone (ROCEPHIN) 1 g in sodium chloride 0.9 % 100 mL IVPB        1 g 200 mL/hr over 30 Minutes Intravenous  Once 02/21/22 1202 02/21/22 1251   02/21/22 1215  azithromycin (ZITHROMAX) 500 mg in sodium chloride 0.9 % 250 mL IVPB        500 mg 250 mL/hr over 60 Minutes Intravenous  Once 02/21/22 1202 02/21/22 1449       Subjective: Patient seen and examined at the bedside today.  She was on 2 to 3 L.  Not in respiratory distress.  Speaks in full sentences but still little short of breath and appears tachypneic and anxious.  Husband at bedside.  No fever or hypothermia. Objective: Vitals:   02/23/22 0900 02/23/22 0905 02/23/22 1000 02/23/22 1100  BP:  (!) 150/68 136/72 (!) 146/75  Pulse: 63 65 66 68  Resp: (!) 22 (!) 29 (!) 30 18  Temp:    98 F (36.7 C)  TempSrc:    Oral  SpO2: 100% 100% 97% 99%  Height:        Intake/Output Summary (Last 24 hours) at 02/23/2022 1121 Last data filed at 02/23/2022 0744 Gross per 24 hour  Intake 970.28 ml  Output 500 ml  Net 470.28 ml   There were no vitals filed for this visit.  Examination:  General exam: Overall comfortable, not in distress HEENT: PERRL Respiratory system: Coarse breath sounds, some crackles bilaterally Cardiovascular system: S1 & S2 heard, RRR.  Gastrointestinal system: Abdomen is nondistended, soft and nontender. Central nervous system: Alert and oriented Extremities: No edema, no clubbing ,no cyanosis Skin: No rashes, no ulcers,no icterus     Data Reviewed: I have personally reviewed following labs and imaging studies  CBC: Recent Labs  Lab 02/21/22 1017 02/21/22 1245 02/22/22 0248 02/23/22 0247  WBC 10.1  --  14.3* 19.5*  HGB 12.2 11.2* 11.2* 10.4*  HCT 36.7 33.0* 34.2* 32.2*  MCV 94.3  --  96.1 96.4  PLT 272  --  237 798   Basic Metabolic Panel: Recent Labs  Lab 02/21/22 1017 02/21/22 1245 02/22/22 0248 02/23/22 0247  NA 132* 130* 132* 132*  K 4.3 3.9 3.9 5.0   CL 96*  --  98 103  CO2 26  --  22 22  GLUCOSE 106*  --  203* 128*  BUN 18  --  21 21  CREATININE 1.06*  --  1.22* 0.82  CALCIUM 9.5  --  8.3* 9.0  MG  --   --  1.9 2.3     Recent Results (from the past 240 hour(s))  Culture, blood (Routine x 2)     Status: None (Preliminary result)   Collection Time: 02/21/22 10:32 AM   Specimen: BLOOD  Result Value Ref Range Status   Specimen Description   Final    BLOOD RIGHT ANTECUBITAL Performed at [facility], [address] [place], [place] [address]    Special Requests   Final    BOTTLES DRAWN AEROBIC AND ANAEROBIC Blood Culture adequate volume Performed at [facility], [id number]  [address], [place], [place] [address]    Culture   Final    NO GROWTH 2 DAYS Performed at [facility], [place] [address], [place], [place] [address]    Report Status PENDING  Incomplete  SARS Coronavirus 2 by RT PCR (hospital order, performed in [facility] hospital lab) *cepheid single result test* Anterior Nasal Swab     Status: None   Collection Time: 02/21/22 11:34 AM   Specimen: Anterior Nasal Swab  Result Value Ref Range Status   SARS Coronavirus 2 by RT PCR NEGATIVE NEGATIVE Final    Comment: (NOTE) SARS-CoV-2 target nucleic acids are NOT DETECTED.  The SARS-CoV-2 RNA is generally detectable in upper and lower respiratory specimens during the acute phase of infection. The lowest concentration of SARS-CoV-2 viral copies this assay can detect is 250 copies / mL. A negative result does not preclude SARS-CoV-2 infection and should not be used as the sole basis for treatment or other patient management decisions.  A negative result may occur with improper specimen collection / handling, submission of specimen other than nasopharyngeal swab, presence of viral mutation(s) within the areas targeted by this assay, and inadequate number of viral copies (<250 copies / mL). A negative result must be combined with  clinical observations, patient history, and epidemiological information.  Fact Sheet for Patients:   https://www.patel.info/  Fact Sheet for Healthcare Providers: https://hall.com/  This test is not yet approved or  cleared by the [place] FDA and has been authorized for detection and/or diagnosis of SARS-CoV-2 by FDA under an Emergency Use Authorization (EUA).  This EUA will remain in effect (meaning this test can be used) for the duration of the COVID-19 declaration under Section 564(b)(1) of the Act, 21 U.S.C. section 360bbb-3(b)(1), unless the authorization is terminated or revoked sooner.  Performed at [facility], [address], [place], [place] [address]   Culture, blood (Routine x 2)     Status: None (Preliminary result)   Collection Time: 02/21/22 11:34 AM   Specimen: BLOOD  Result Value Ref Range Status   Specimen Description   Final    BLOOD BLOOD LEFT HAND Performed at [facility], [address], [place], [place] [address]    Special Requests   Final    BOTTLES DRAWN AEROBIC AND ANAEROBIC Blood Culture adequate volume Performed at [facility], [address] [place], [place] [address]    Culture   Final    NO GROWTH 2 DAYS Performed at [facility], [place] [address] [place], [place] [address]    Report Status PENDING  Incomplete     Radiology Studies: DG Chest Port 1 View  Result Date: 02/22/2022 CLINICAL DATA:  Respiratory distress EXAM: PORTABLE CHEST 1 VIEW COMPARISON:  10:53 a.m. FINDINGS: The lungs are symmetrically expanded. Superimposed diffuse interstitial and airspace infiltrate is present, more focal within the right upper lobe, stable since prior examination in keeping with atypical infection or asymmetric pulmonary edema. No pneumothorax or pleural effusion. Cardiac size is mildly enlarged. Left subclavian dual lead pacemaker is  unchanged. No acute bone abnormality. IMPRESSION: 1. Stable asymmetric diffuse pulmonary infiltrate, in keeping with atypical infection or asymmetric pulmonary edema. 2. Mild cardiomegaly. Electronically Signed   By: Fidela Salisbury M.D.   On: 02/22/2022 00:24    Scheduled Meds:  amiodarone  200 mg Oral QPM   apixaban  5 mg Oral BID   arformoterol  15 mcg Nebulization BID   ascorbic acid  500  mg Oral Daily   budesonide (PULMICORT) nebulizer solution  0.5 mg Nebulization BID   Chlorhexidine Gluconate Cloth  6 each Topical Daily   cholecalciferol  4,000 Units Oral QPM   empagliflozin  10 mg Oral QAC breakfast   fluticasone  1 spray Each Nare Daily   folic acid  1 mg Oral QPM   gabapentin  300 mg Oral TID   metoprolol tartrate  25 mg Oral BID   predniSONE  40 mg Oral Q breakfast   revefenacin  175 mcg Nebulization Daily   vitamin E  400 Units Oral Daily   Continuous Infusions:  sodium chloride 10 mL/hr at 02/21/22 1220   azithromycin (ZITHROMAX) 500 mg in sodium chloride 0.9 % 250 mL IVPB Stopped (02/22/22 1612)   cefTRIAXone (ROCEPHIN)  IV 2 g (02/23/22 1109)     LOS: 2 days   Shelly Coss, MD [facility] P11/07/2021, 11:21 AM

## 2022-02-24 ENCOUNTER — Inpatient Hospital Stay (HOSPITAL_COMMUNITY): Payer: PPO

## 2022-02-24 DIAGNOSIS — J9621 Acute and chronic respiratory failure with hypoxia: Secondary | ICD-10-CM | POA: Diagnosis not present

## 2022-02-24 DIAGNOSIS — J189 Pneumonia, unspecified organism: Secondary | ICD-10-CM | POA: Diagnosis not present

## 2022-02-24 LAB — BASIC METABOLIC PANEL
Anion gap: 7 (ref 5–15)
BUN: 27 mg/dL — ABNORMAL HIGH (ref 8–23)
CO2: 22 mmol/L (ref 22–32)
Calcium: 9 mg/dL (ref 8.9–10.3)
Chloride: 101 mmol/L (ref 98–111)
Creatinine, Ser: 0.93 mg/dL (ref 0.44–1.00)
GFR, Estimated: >60 mL/min (ref >60)
Glucose, Bld: 92 mg/dL (ref 70–99)
Potassium: 4.7 mmol/L (ref 3.5–5.1)
Sodium: 130 mmol/L — ABNORMAL LOW (ref 135–145)

## 2022-02-24 LAB — BLOOD GAS, ARTERIAL
Acid-base deficit: 0.5 mmol/L (ref 0.0–2.0)
Bicarbonate: 22.9 mmol/L (ref 20.0–28.0)
O2 Saturation: 99.4 %
Patient temperature: 37.1
pCO2 arterial: 33 mmHg (ref 32–48)
pH, Arterial: 7.45 (ref 7.35–7.45)
pO2, Arterial: 90 mmHg (ref 83–108)

## 2022-02-24 LAB — CBC
HCT: 32.1 % — ABNORMAL LOW (ref 36.0–46.0)
Hemoglobin: 10.7 g/dL — ABNORMAL LOW (ref 12.0–15.0)
MCH: 31.6 pg (ref 26.0–34.0)
MCHC: 33.3 g/dL (ref 30.0–36.0)
MCV: 94.7 fL (ref 80.0–100.0)
Platelets: 282 10*3/uL (ref 150–400)
RBC: 3.39 MIL/uL — ABNORMAL LOW (ref 3.87–5.11)
RDW: 15.9 % — ABNORMAL HIGH (ref 11.5–15.5)
WBC: 16 10*3/uL — ABNORMAL HIGH (ref 4.0–10.5)
nRBC: 0 % (ref 0.0–0.2)

## 2022-02-24 LAB — MRSA NEXT GEN BY PCR, NASAL: MRSA by PCR Next Gen: NOT DETECTED

## 2022-02-24 MED ORDER — VANCOMYCIN HCL 750 MG/150ML IV SOLN
750.0000 mg | INTRAVENOUS | Status: DC
  Filled 2022-02-24: qty 150

## 2022-02-24 MED ORDER — PIPERACILLIN-TAZOBACTAM 3.375 G IVPB
3.3750 g | Freq: Three times a day (TID) | INTRAVENOUS | Status: DC
  Administered 2022-02-24 – 2022-02-26 (×6): 3.375 g via INTRAVENOUS
  Filled 2022-02-24 (×6): qty 50

## 2022-02-24 MED ORDER — VANCOMYCIN HCL 1500 MG/300ML IV SOLN
1500.0000 mg | Freq: Once | INTRAVENOUS | Status: AC
  Administered 2022-02-24: 1500 mg via INTRAVENOUS
  Filled 2022-02-24: qty 300

## 2022-02-24 MED ORDER — FUROSEMIDE 10 MG/ML IJ SOLN
20.0000 mg | INTRAMUSCULAR | Status: DC
  Filled 2022-02-24: qty 2

## 2022-02-24 MED ORDER — METHYLPREDNISOLONE SODIUM SUCC 125 MG IJ SOLR
125.0000 mg | Freq: Every day | INTRAMUSCULAR | Status: DC
  Administered 2022-02-24: 125 mg via INTRAVENOUS
  Filled 2022-02-24: qty 2

## 2022-02-24 MED ORDER — LORAZEPAM 2 MG/ML IJ SOLN
0.5000 mg | Freq: Four times a day (QID) | INTRAMUSCULAR | Status: DC | PRN
  Administered 2022-02-27: 0.5 mg via INTRAVENOUS
  Filled 2022-02-24: qty 1

## 2022-02-24 MED ORDER — HYDRALAZINE HCL 20 MG/ML IJ SOLN
10.0000 mg | Freq: Once | INTRAMUSCULAR | Status: AC
  Administered 2022-02-24: 10 mg via INTRAVENOUS
  Filled 2022-02-24: qty 1

## 2022-02-24 NOTE — Progress Notes (Signed)
Pt transferred to the room 1233 from 2J03 without complications.

## 2022-02-24 NOTE — Plan of Care (Signed)

## 2022-02-24 NOTE — Consult Note (Signed)
NAME:  Sheila Morales, MRN:  [id number], DOB:  [birth year], LOS: 3 ADMISSION DATE:  02/21/2022, CONSULTATION DATE:  02/23/22 REFERRING MD:  Dr. Tawanna Solo, [facility], CHIEF COMPLAINT:  Short of breath   History of Present Illness:  71 yo female former smoker presented to ER with fever (Tm 103.1), chest congestion, shortness of breath and malaise.  SpO2 88% on 2 liters in ER.  She was started on antibiotics for pneumonia.  She is followed in the pulmonary office by Dr. Silas Flood for COPD and eosinophilic pneumonia.  She is on MTX as an outpt for SLE.  PCCM consulted to assist with respiratory management.  Pertinent  Medical History  A fib, CHF, Ischemic colitis, COPD, Rheumatic fever, IBS, Pacemaker, Colon polyp, RLS, Cutaneous lupus, Chronic respiratory failure on 2 liters oxygen, Tremor, Memory deficit  Significant Hospital Events: Including procedures, antibiotic start and stop dates in addition to other pertinent events   11/02 Admit, start ABx 11/03 increased dyspnea, transfer to SDU and start on Bipap 11/05 increased WOB, chills  Studies:  PFT 02/06/17 >> FEV1 1.30 (57%), FEV1% 59, TLC 4.65 (94%), DLCO 54% CT chest 04/24/21 >> b/l upper lobe GGO, emphysema, early subpleural honeycombing Echo 01/10/22 >> EF 65 to 70%, mild LVH, RVSP 26.6 mmHg, mild/mod MR  Interim History / Subjective:  Feels more short of breath.  Back on Bipap.  Feels chills and has rigors.    Objective   Blood pressure 136/77, pulse 75, temperature 98.8 F (37.1 C), temperature source Axillary, resp. rate (!) 24, height '5\' 2"'$  (1.575 m), weight 60 kg, SpO2 98 %.    FiO2 (%):  [40 %] 40 %   Intake/Output Summary (Last 24 hours) at 02/24/2022 1011 Last data filed at 02/24/2022 0800 Gross per 24 hour  Intake 593.13 ml  Output --  Net 593.13 ml   Filed Weights   02/23/22 1741  Weight: 60 kg    Examination:  General - shaky Eyes - pupils reactive ENT - Bipap mask on Cardiac - regular rate/rhythm, no murmur Chest  - b/l crackles Abdomen - soft, non tender, + bowel sounds Extremities - no cyanosis, clubbing, or edema Skin - no rashes Neuro - moves extremities, follows commands   Resolved Hospital Problem list     Assessment & Plan:   Acute on chronic hypoxic respiratory failure. - from pneumonia with hx of COPD and ILD - goal SpO2 > 92% - continue Bipap as needed - monitor need for intubation  Pneumonia. - will change to vancomycin and zosyn to treat for HCAP coverage given that she is on chronic immunosuppressive therapies - f/u CXR 11/06 - f/u blood cultures from 11/02  COPD with emphysema with acute exacerbation. 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 - don't think she has exacerbation of ILD at this time  Hx of cutaneous lupus with positive ANA, SSA/SSB. - plaquenil recently stopped due concern about cardiomyopathy - hold MTX in setting of infection; normally takes this on Thursdays - followed by Dr. Gavin Pound with rheumatology as outpt  Paroxysmal atrial fibrillation on eliquis. Chronic HFpEF. Chronic hyponatremia. Peripheral neuropathy. - per primary team  Best Practice (right click and "Reselect all SmartList Selections" daily)   Diet/type: Regular consistency (see orders) DVT prophylaxis: DOAC GI prophylaxis: N/A Lines: N/A Foley:  N/A Code Status:  full code Last date of multidisciplinary goals of care discussion [updated her husband  at bedside]  Labs       Latest Ref Rng & Units 02/24/2022    4:39 AM 02/23/2022    2:47 AM 02/22/2022    2:48 AM  CMP  Glucose 70 - 99 mg/dL 92  128  203   BUN 8 - 23 mg/dL '27  21  21   '$ Creatinine 0.44 - 1.00 mg/dL 0.93  0.82  1.22   Sodium 135 - 145 mmol/L 130  132  132   Potassium 3.5 - 5.1 mmol/L 4.7  5.0  3.9   Chloride 98 - 111 mmol/L 101  103  98   CO2 22 - 32 mmol/L '22  22  22   '$ Calcium 8.9 - 10.3 mg/dL 9.0  9.0   8.3        Latest Ref Rng & Units 02/24/2022    4:39 AM 02/23/2022    2:47 AM 02/22/2022    2:48 AM  CBC  WBC 4.0 - 10.5 K/uL 16.0  19.5  14.3   Hemoglobin 12.0 - 15.0 g/dL 10.7  10.4  11.2   Hematocrit 36.0 - 46.0 % 32.1  32.2  34.2   Platelets 150 - 400 K/uL 282  261  237     ABG    Component Value Date/Time   PHART 7.45 02/24/2022 0919   PCO2ART 33 02/24/2022 0919   PO2ART 90 02/24/2022 0919   HCO3 22.9 02/24/2022 0919   TCO2 24 02/21/2022 1245   ACIDBASEDEF 0.5 02/24/2022 0919   O2SAT 99.4 02/24/2022 0919    CBG (last 3)  Recent Labs    02/22/22 1204 02/23/22 0751  GLUCAP 198* 104*    Critical care time: 37 minutes  Chesley Mires, MD [place] Pager - [phone] - [phone] 02/24/2022, 10:11 AM

## 2022-02-24 NOTE — Progress Notes (Addendum)
PROGRESS NOTE  Sheila Morales  VQM:[id number] DOB: October 30, 1950 DOA: 02/21/2022 PCP: Hoyt Koch, MD   Brief Narrative: Patient is  Broyles is a 71 y.o. female with medical history significant of COPD, SLE on methotrexate, interstitial lung disease/eosinophilic pneumonia, chronic diastolic CHF, paroxysmal A-fib, sick sinus syndrome status post pacemaker placement, chronic hyponatremia who presented to the emergency department at [facility] with complaint of fever, weakness, dyspnea, cough. Patient uses chronically 2 L of oxygen as needed at home for her COPD/ILD.  Chest x-ray showed right upper lobe infiltrate Patient was admitted for the management of community-acquired pneumonia.  Hospital course remarkable for persistent dyspnea, respiratory distress requiring BiPAP.  Transferred to ICU today, PCCM following.  Assessment & Plan:  Principal Problem:   CAP (community acquired pneumonia) Active Problems:   Systemic lupus erythematosus (HCC)   COPD (chronic obstructive pulmonary disease) (HCC)   Hyponatremia   Acute hypoxemic respiratory failure (HCC)   Chronic diastolic CHF (congestive heart failure) (HCC)   AF (paroxysmal atrial fibrillation) ([place])   Community acquired pneumonia: Presented with dyspnea, cough, fever, low appetite.  Chest x-ray showed possible right upper lobe pneumonia.  Currently on ceftriaxone, azithromycin.Changed to Vanco and Zosyn.  Negative Streptococcus pneumoniae antigen,  Legionella antigen.  Blood culture negative so far. Chest x-ray done for follow-up on 11/5 showed interstitial and patchy hazy alveolar opacities in the right lung fields and scattered opacities in the left mid to lower lung field, slightly improved except in the right upper lobe.  Sepsis: Has leukocytosis, elevated procalcitonin.  Continue current antibiotics.  Follow-up cultures.  Afebrile, blood pressure stable   Acute on chronic hypoxic respiratory failure: Secondary to pneumonia and COPD  exacerbation.  Uses 2 L of oxygen at home as needed but requiring continuous 2 L .Patient became hypoxic and went to respiratory distress on the night of 11/3, had to put on BiPAP.  In the morning of 11/5, she again went into respiratory distress and had to be put on BiPAP  History of COPD/ILD: Chronically on 2 L of oxygen at home as needed.  Follows with pulmonology for eosinophilic pneumonia. Continue bronchodilators as needed needed, home inhalers.  Continue Solu-Medrol   SLE: Takes methotrexate.   History of paroxysmal A-fib: Currently in normal sinus rhythm.  Status post pacemaker for sick sinus syndrome.  On Eliquis for anticoagulation. Takes metoprolol for rate control.   Chronic diastolic CHF: No evidence of fluid overload currently, might be on the drier side because of poor oral intake so was given gentle IV fluids, now stopped.  Lasix on hold    Chronic hyponatremia: On reviewing her previous labs, she has chronic hyponatremia.  Currently sodium stable at 130s.    Peripheral neuropathy: On gabapentin  Nonsustained V. tach: Magnesium and potassium supplemented   Weakness/deconditioning: We have requested PT evaluation,recommended HH        DVT prophylaxis: apixaban (ELIQUIS) tablet 5 mg     Code Status: Full Code  Family Communication: Husband at bedside  Patient status:Inpatient  Patient is from :Home  Anticipated discharge DT:OIZT  Estimated DC date:2-3 days   Consultants: PCCM  Procedures:None  Antimicrobials:  Anti-infectives (From admission, onward)    Start     Dose/Rate Route Frequency Ordered Stop   02/22/22 1400  azithromycin (ZITHROMAX) 500 mg in sodium chloride 0.9 % 250 mL IVPB  Status:  Discontinued        500 mg 250 mL/hr over 60 Minutes Intravenous Every 24 hours 02/21/22 1713 02/24/22 1013  02/22/22 1200  cefTRIAXone (ROCEPHIN) 1 g in sodium chloride 0.9 % 100 mL IVPB  Status:  Discontinued        1 g 200 mL/hr over 30 Minutes  Intravenous  Once 02/21/22 1713 02/21/22 1717   02/22/22 1200  cefTRIAXone (ROCEPHIN) 2 g in sodium chloride 0.9 % 100 mL IVPB  Status:  Discontinued        2 g 200 mL/hr over 30 Minutes Intravenous Every 24 hours 02/21/22 1713 02/24/22 1013   02/21/22 1815  cefTRIAXone (ROCEPHIN) 1 g in sodium chloride 0.9 % 100 mL IVPB        1 g 200 mL/hr over 30 Minutes Intravenous  Once 02/21/22 1717 02/22/22 0845   02/21/22 1215  cefTRIAXone (ROCEPHIN) 1 g in sodium chloride 0.9 % 100 mL IVPB        1 g 200 mL/hr over 30 Minutes Intravenous  Once 02/21/22 1202 02/21/22 1251   02/21/22 1215  azithromycin (ZITHROMAX) 500 mg in sodium chloride 0.9 % 250 mL IVPB        500 mg 250 mL/hr over 60 Minutes Intravenous  Once 02/21/22 1202 02/21/22 1449       Subjective: Patient seen and examined at the bedside today.  She again went into respiratory distress, became tachypneic, anxious this morning and had to be put on BiPAP.  Rapid response was called and she has been transferred to ICU  Objective: Vitals:   02/24/22 0622 02/24/22 0629 02/24/22 0859 02/24/22 0903  BP: 136/77     Pulse: 78 75    Resp: (!) 24     Temp:   99 F (37.2 C) 98.8 F (37.1 C)  TempSrc:   Rectal Axillary  SpO2: 98% 98%    Weight:      Height:        Intake/Output Summary (Last 24 hours) at 02/24/2022 1017 Last data filed at 02/24/2022 0800 Gross per 24 hour  Intake 593.13 ml  Output --  Net 593.13 ml   Filed Weights   02/23/22 1741  Weight: 60 kg    Examination:  General exam: Anxious, moderate respite distress HEENT: PERRL Respiratory system: Diminished bilateral breath sounds, rhonchi/crackles Cardiovascular system: S1 & S2 heard, RRR.  Gastrointestinal system: Abdomen is nondistended, soft and nontender. Central nervous system: Alert and oriented Extremities: No edema, no clubbing ,no cyanosis Skin: No rashes, no ulcers,no icterus    Data Reviewed: I have personally reviewed following labs and imaging  studies  CBC: Recent Labs  Lab 02/21/22 1017 02/21/22 1245 02/22/22 0248 02/23/22 0247 02/24/22 0439  WBC 10.1  --  14.3* 19.5* 16.0*  HGB 12.2 11.2* 11.2* 10.4* 10.7*  HCT 36.7 33.0* 34.2* 32.2* 32.1*  MCV 94.3  --  96.1 96.4 94.7  PLT 272  --  237 261 564   Basic Metabolic Panel: Recent Labs  Lab 02/21/22 1017 02/21/22 1245 02/22/22 0248 02/23/22 0247 02/24/22 0439  NA 132* 130* 132* 132* 130*  K 4.3 3.9 3.9 5.0 4.7  CL 96*  --  98 103 101  CO2 26  --  '22 22 22  '$ GLUCOSE 106*  --  203* 128* 92  BUN 18  --  21 21 27*  CREATININE 1.06*  --  1.22* 0.82 0.93  CALCIUM 9.5  --  8.3* 9.0 9.0  MG  --   --  1.9 2.3  --      Recent Results (from the past 240 hour(s))  Culture, blood (Routine x 2)  Status: None (Preliminary result)   Collection Time: 02/21/22 10:32 AM   Specimen: BLOOD  Result Value Ref Range Status   Specimen Description   Final    BLOOD RIGHT ANTECUBITAL Performed at [facility], [place] [address] [place], [place] [address]    Special Requests   Final    BOTTLES DRAWN AEROBIC AND ANAEROBIC Blood Culture adequate volume Performed at [facility], [address] [place], [place] [address]    Culture   Final    NO GROWTH 3 DAYS Performed at [facility], [place] [address] [place], [place] [address]    Report Status PENDING  Incomplete  SARS Coronavirus 2 by RT PCR (hospital order, performed in [facility] hospital lab) *cepheid single result test* Anterior Nasal Swab     Status: None   Collection Time: 02/21/22 11:34 AM   Specimen: Anterior Nasal Swab  Result Value Ref Range Status   SARS Coronavirus 2 by RT PCR NEGATIVE NEGATIVE Final    Comment: (NOTE) SARS-CoV-2 target nucleic acids are NOT DETECTED.  The SARS-CoV-2 RNA is generally detectable in upper and lower respiratory specimens during the acute phase of infection. The lowest concentration of SARS-CoV-2 viral copies this assay can detect is 250 copies /  mL. A negative result does not preclude SARS-CoV-2 infection and should not be used as the sole basis for treatment or other patient management decisions.  A negative result may occur with improper specimen collection / handling, submission of specimen other than nasopharyngeal swab, presence of viral mutation(s) within the areas targeted by this assay, and inadequate number of viral copies (<250 copies / mL). A negative result must be combined with clinical observations, patient history, and epidemiological information.  Fact Sheet for Patients:   https://www.patel.info/  Fact Sheet for Healthcare Providers: https://hall.com/  This test is not yet approved or  cleared by the [place] FDA and has been authorized for detection and/or diagnosis of SARS-CoV-2 by FDA under an Emergency Use Authorization (EUA).  This EUA will remain in effect (meaning this test can be used) for the duration of the COVID-19 declaration under Section 564(b)(1) of the Act, 21 U.S.C. section 360bbb-3(b)(1), unless the authorization is terminated or revoked sooner.  Performed at [facility], [address] [place], [place] [address]   Culture, blood (Routine x 2)     Status: None (Preliminary result)   Collection Time: 02/21/22 11:34 AM   Specimen: BLOOD  Result Value Ref Range Status   Specimen Description   Final    BLOOD BLOOD LEFT HAND Performed at [facility], [address] [place], [place] [address]    Special Requests   Final    BOTTLES DRAWN AEROBIC AND ANAEROBIC Blood Culture adequate volume Performed at [facility], [address], [place], [place] [address]    Culture   Final    NO GROWTH 3 DAYS Performed at [facility], [place] [address], [place], [place] [address]    Report Status PENDING  Incomplete     Radiology Studies: DG CHEST PORT 1 VIEW  Result Date:  02/24/2022 CLINICAL DATA:  [id number] with shortness of breath. Currently being treated for pneumonia. EXAM: PORTABLE CHEST 1 VIEW COMPARISON:  Portable chest 02/21/2022 FINDINGS: 6:43 a.m. Left chest dual lead pacing system and wire insertions are unaltered. There is cardiomegaly. There is slight central vascular prominence with improvement. Interstitial and patchy hazy alveolar opacities of the right lung fields, scattered opacities in  the left mid to lower lung field are again noted, most dense opacities again in the right upper lobe where they are unchanged, elsewhere the opacities slightly improved. Small pleural effusions appear similar. There is no new or worsening infiltrate. Stable mediastinum with aortic atherosclerotic.  Osteopenia. IMPRESSION: 1. Improvement in central vascular prominence. 2. Interstitial and patchy hazy alveolar opacities in the right lung fields and scattered opacities in the left mid to lower lung field, slightly improved except in the right upper lobe. 3. Stable small pleural effusions. 4. Cardiomegaly. 5. Aortic atherosclerosis. Electronically Signed   By: Telford Nab M.D.   On: 02/24/2022 07:01    Scheduled Meds:  amiodarone  200 mg Oral QPM   apixaban  5 mg Oral BID   arformoterol  15 mcg Nebulization BID   ascorbic acid  500 mg Oral Daily   budesonide (PULMICORT) nebulizer solution  0.5 mg Nebulization BID   Chlorhexidine Gluconate Cloth  6 each Topical Daily   cholecalciferol  4,000 Units Oral QPM   empagliflozin  10 mg Oral QAC breakfast   fluticasone  1 spray Each Nare Daily   folic acid  1 mg Oral QPM   gabapentin  300 mg Oral TID   methylPREDNISolone (SOLU-MEDROL) injection  125 mg Intravenous Daily   metoprolol tartrate  25 mg Oral BID   revefenacin  175 mcg Nebulization Daily   vitamin E  400 Units Oral Daily   Continuous Infusions:  sodium chloride 10 mL/hr at 02/21/22 1220     LOS: 3 days   Shelly Coss, MD [facility] P11/08/2021, 10:17  AM

## 2022-02-24 NOTE — Progress Notes (Signed)
Pharmacy Antibiotic Note  Sheila Morales is a 71 y.o. female admitted on 02/21/2022 with fever, weakness. She was started on antibiotics for CAP. Pharmacy has been consulted by CCMD for vancomycin and Zosyn dosing for broader coverage since she is on chronic immunosuppressive therapy.  Plan: -Vancomycin 1500 mg IV x 1 followed by 750 mg IV q24h -Zosyn 3.375 g IV q8h extended infusion -Monitor renal function closely given concern for nephrotoxicity -MRSA PCR ordered to help guide therapy -Pharmacy to continue to follow renal function, cultures and clinical progress for dose adjustments and de-escalation as indicated  Height: '5\' 2"'$  (157.5 cm) Weight: 60 kg (132 lb 3.2 oz) IBW/kg (Calculated) : 50.1  Temp (24hrs), Avg:98.1 F (36.7 C), Min:97.5 F (36.4 C), Max:99 F (37.2 C)  Recent Labs  Lab 02/21/22 1017 02/21/22 1032 02/22/22 0248 02/23/22 0247 02/24/22 0439  WBC 10.1  --  14.3* 19.5* 16.0*  CREATININE 1.06*  --  1.22* 0.82 0.93  LATICACIDVEN  --  1.6  --   --   --     Estimated Creatinine Clearance: 43.2 mL/min (by C-G formula based on SCr of 0.93 mg/dL).    No Known Allergies  Antimicrobials this admission: Zosyn 11/5 >> Vancomycin 11/5 >> Ceftriaxone 11/2 >> 11/5 Azithromycin 11/2 >> 11/5  Dose adjustments this admission: NA  Microbiology results: 11/2 BCx: ngtd 11/5 MRSA PCR: ordered  Thank you for allowing pharmacy to be a part of this patient's care.  Tawnya Crook, PharmD, BCPS Clinical Pharmacist 02/24/2022 10:38 AM

## 2022-02-25 ENCOUNTER — Telehealth: Payer: Self-pay | Admitting: Physician Assistant

## 2022-02-25 ENCOUNTER — Ambulatory Visit (INDEPENDENT_AMBULATORY_CARE_PROVIDER_SITE_OTHER): Payer: PPO

## 2022-02-25 DIAGNOSIS — J189 Pneumonia, unspecified organism: Secondary | ICD-10-CM | POA: Diagnosis not present

## 2022-02-25 DIAGNOSIS — J9601 Acute respiratory failure with hypoxia: Secondary | ICD-10-CM | POA: Diagnosis not present

## 2022-02-25 DIAGNOSIS — I495 Sick sinus syndrome: Secondary | ICD-10-CM

## 2022-02-25 LAB — RESPIRATORY PANEL BY PCR

## 2022-02-25 LAB — CBC
HCT: 33.8 % — ABNORMAL LOW (ref 36.0–46.0)
Hemoglobin: 11 g/dL — ABNORMAL LOW (ref 12.0–15.0)
MCH: 31.1 pg (ref 26.0–34.0)
MCHC: 32.5 g/dL (ref 30.0–36.0)
MCV: 95.5 fL (ref 80.0–100.0)
Platelets: 281 10*3/uL (ref 150–400)
RBC: 3.54 MIL/uL — ABNORMAL LOW (ref 3.87–5.11)
RDW: 15.9 % — ABNORMAL HIGH (ref 11.5–15.5)
WBC: 10.8 10*3/uL — ABNORMAL HIGH (ref 4.0–10.5)
nRBC: 0 % (ref 0.0–0.2)

## 2022-02-25 LAB — BASIC METABOLIC PANEL
Anion gap: 9 (ref 5–15)
BUN: 31 mg/dL — ABNORMAL HIGH (ref 8–23)
CO2: 22 mmol/L (ref 22–32)
Calcium: 9.1 mg/dL (ref 8.9–10.3)
Chloride: 99 mmol/L (ref 98–111)
Creatinine, Ser: 1.05 mg/dL — ABNORMAL HIGH (ref 0.44–1.00)
GFR, Estimated: 56 mL/min — ABNORMAL LOW (ref >60)
Glucose, Bld: 133 mg/dL — ABNORMAL HIGH (ref 70–99)
Potassium: 4.4 mmol/L (ref 3.5–5.1)
Sodium: 130 mmol/L — ABNORMAL LOW (ref 135–145)

## 2022-02-25 MED ORDER — METHYLPREDNISOLONE SODIUM SUCC 125 MG IJ SOLR
125.0000 mg | Freq: Once | INTRAMUSCULAR | Status: AC
  Administered 2022-02-25: 125 mg via INTRAVENOUS
  Filled 2022-02-25: qty 2

## 2022-02-25 MED ORDER — METHYLPREDNISOLONE SODIUM SUCC 40 MG IJ SOLR: 40.0000 mg | Freq: Every day | INTRAMUSCULAR | Status: DC

## 2022-02-25 NOTE — Progress Notes (Signed)
NAME:  Sheila Morales, MRN:  [id number], DOB:  1951/01/31, LOS: 4 ADMISSION DATE:  02/21/2022, CONSULTATION DATE:  02/23/22 REFERRING MD:  Dr. Tawanna Solo, [facility], CHIEF COMPLAINT:  Short of breath   History of Present Illness:  71 yo female former smoker presented to ER with fever (Tm 103.1), chest congestion, shortness of breath and malaise.  SpO2 88% on 2 liters in ER.  She was started on antibiotics for pneumonia.  She is followed in the pulmonary office by Dr. Silas Flood for COPD and eosinophilic pneumonia.  She is on MTX as an outpt for SLE.  PCCM consulted to assist with respiratory management.  Pertinent  Medical History  A fib, CHF, Ischemic colitis, COPD, Rheumatic fever, IBS, Pacemaker, Colon polyp, RLS, Cutaneous lupus, Chronic respiratory failure on 2 liters oxygen, Tremor, Memory deficit  Significant Hospital Events: Including procedures, antibiotic start and stop dates in addition to other pertinent events   11/02 Admit, start ABx 11/03 increased dyspnea, transfer to SDU and start on Bipap 11/05 increased WOB, chills  Studies:  PFT 02/06/17 >> FEV1 1.30 (57%), FEV1% 59, TLC 4.65 (94%), DLCO 54% CT chest 04/24/21 >> b/l upper lobe GGO, emphysema, early subpleural honeycombing Echo 01/10/22 >> EF 65 to 70%, mild LVH, RVSP 26.6 mmHg, mild/mod MR  Interim History / Subjective:  Feels a lot better. Was on 3L O2 but has been off this morning using commode and around room. Sats maintaining around 93 - 95%.  Objective   Blood pressure (!) 172/90, pulse 64, temperature (!) 97.4 F (36.3 C), temperature source Oral, resp. rate 18, height '5\' 2"'$  (1.575 m), weight 60 kg, SpO2 96 %.        Intake/Output Summary (Last 24 hours) at 02/25/2022 0851 Last data filed at 02/25/2022 0612 Gross per 24 hour  Intake 122.76 ml  Output 2400 ml  Net -2277.24 ml    Filed Weights   02/23/22 1741  Weight: 60 kg    Examination: General: Adult female, resting in bed, in NAD. Neuro: A&O x 3, no  deficits. HEENT: [place]/AT. Sclerae anicteric. EOMI. Cardiovascular: RRR, no M/R/G.  Lungs: Respirations even and unlabored.  CTA bilaterally, No W/R/R. Abdomen: BS x 4, soft, NT/ND.  Musculoskeletal: No gross deformities, no edema.  Skin: Intact, warm, no rashes.  Assessment & Plan:   Acute on chronic hypoxic respiratory failure - from pneumonia with hx of COPD and ILD. Pneumonia. COPD with emphysema with acute exacerbation. - Continue supplemental O2 for goal SpO2 > 92%. - Continue vancomycin and zosyn to treat for HCAP coverage given that she is on chronic immunosuppressive therapies. - f/u blood cultures from 11/02. - Continue yupelri, brovana, pulmicort for now. - prn albuterol. - changed to solumedrol on 11/05, will drop down to '40mg'$  daily starting 11/7 then recommend transition to PO prednisone with slow taper until seen by pulmonary as outpatient. - Will ask our office to arrange for follow up next week given that her next appointment with Dr. Silas Flood is not until 12/6.  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 - don't think she has exacerbation of ILD at this time.  Hx of cutaneous lupus with positive ANA, SSA/SSB - plaquenil recently stopped due concern about cardiomyopathy. - hold MTX in setting of infection; normally takes this on Thursdays - followed by Dr. Gavin Pound with rheumatology as outpt.  Paroxysmal atrial fibrillation on eliquis. Chronic HFpEF. Chronic hyponatremia. Peripheral neuropathy. - per primary team  PCCM available as needed, please call if needs  arise. Otherwise, will arrange for outpatient follow up next week.   Best Practice (right click and "Reselect all SmartList Selections" daily)  Per primary team.   Montey Hora, [place] For pager details, please see AMION or use Epic chat  After 1900, please call [place] for cross coverage needs 02/25/2022, 9:05 AM

## 2022-02-25 NOTE — Progress Notes (Signed)
PROGRESS NOTE  Sheila Morales  ZMO:[id number] DOB: [birth year] DOA: 02/21/2022 PCP: Hoyt Koch, MD   Brief Narrative: Patient is  Sheila Morales is a 71 y.o. female with medical history significant of COPD, SLE on methotrexate, interstitial lung disease/eosinophilic pneumonia, chronic diastolic CHF, paroxysmal A-fib, sick sinus syndrome status post pacemaker placement, chronic hyponatremia who presented to the emergency department at [facility] with complaint of fever, weakness, dyspnea, cough. Patient uses chronically 2 L of oxygen as needed at home for her COPD/ILD.  Chest x-ray showed right upper lobe infiltrate Patient was admitted for the management of community-acquired pneumonia.  Hospital course remarkable for persistent dyspnea, respiratory distress requiring BiPAP, had to be transferred to ICU on 11/5.   Respiratory status  is currently stable today.  Assessment & Plan:  Principal Problem:   CAP (community acquired pneumonia) Active Problems:   Systemic lupus erythematosus (HCC)   COPD (chronic obstructive pulmonary disease) (HCC)   Hyponatremia   Acute hypoxemic respiratory failure (HCC)   Chronic diastolic CHF (congestive heart failure) (HCC)   AF (paroxysmal atrial fibrillation) ([place])   Community acquired pneumonia: Presented with dyspnea, cough, fever, low appetite.  Chest x-ray showed possible right upper lobe pneumonia.  Currently on ceftriaxone, azithromycin.Changed to Vanco and Zosyn.  Negative Streptococcus pneumoniae antigen,  Legionella antigen.  Blood culture negative so far. Chest x-ray done for follow-up on 11/5 showed interstitial and patchy hazy alveolar opacities in the right lung fields and scattered opacities in the left mid to lower lung field, slightly improved except in the right upper lobe. Today she is on room air.  Feels much better. Continue current antibiotics for now  Sepsis: Has leukocytosis, elevated procalcitonin.  Continue current antibiotics.   Follow-up cultures.  Afebrile, blood pressure stable   Acute on chronic hypoxic respiratory failure: Secondary to pneumonia and COPD exacerbation.  Uses 2 L of oxygen at home as needed but requiring continuous 2 L .Patient became hypoxic and went to respiratory distress on the night of 11/3, had to put on BiPAP.  In the morning of 11/5, she again went into respiratory distress and had to be put on BiPAP, transferred ICU.  This morning she is on room air.  History of COPD/ILD: Chronically on 2 L of oxygen at home as needed.  Follows with pulmonology for eosinophilic pneumonia. Continue bronchodilators as needed needed, home inhalers.  Continue Solu-Medrol today, with plan for oral prednisone from tomorrow   SLE: Takes methotrexate.   History of paroxysmal A-fib: Currently in normal sinus rhythm.  Status post pacemaker for sick sinus syndrome.  On Eliquis for anticoagulation. Takes metoprolol for rate control.   Chronic diastolic CHF: No evidence of fluid overload currently, might be on the drier side because of poor oral intake so was given gentle IV fluids, now stopped.  Lasix on hold    Chronic hyponatremia: On reviewing her previous labs, she has chronic hyponatremia.  Currently sodium stable at 130s.    Peripheral neuropathy: On gabapentin  Nonsustained V. tach: Magnesium and potassium supplemented   Weakness/deconditioning: We have requested PT evaluation,recommended HH        DVT prophylaxis: apixaban (ELIQUIS) tablet 5 mg     Code Status: Full Code  Family Communication: Husband at bedside  Patient status:Inpatient  Patient is from :Home  Anticipated discharge KP:TWSF  Estimated DC date: 1 to 2 days   Consultants: PCCM  Procedures:None  Antimicrobials:  Anti-infectives (From admission, onward)    Start     Dose/Rate Route  Frequency Ordered Stop   02/25/22 1200  vancomycin (VANCOREADY) IVPB 750 mg/150 mL  Status:  Discontinued        750 mg 150 mL/hr over 60  Minutes Intravenous Every 24 hours 02/24/22 1032 02/25/22 1033   02/24/22 1130  piperacillin-tazobactam (ZOSYN) IVPB 3.375 g        3.375 g 12.5 mL/hr over 240 Minutes Intravenous Every 8 hours 02/24/22 1032     02/24/22 1115  vancomycin (VANCOREADY) IVPB 1500 mg/300 mL        1,500 mg 150 mL/hr over 120 Minutes Intravenous  Once 02/24/22 1022 02/24/22 1234   02/22/22 1400  azithromycin (ZITHROMAX) 500 mg in sodium chloride 0.9 % 250 mL IVPB  Status:  Discontinued        500 mg 250 mL/hr over 60 Minutes Intravenous Every 24 hours 02/21/22 1713 02/24/22 1013   02/22/22 1200  cefTRIAXone (ROCEPHIN) 1 g in sodium chloride 0.9 % 100 mL IVPB  Status:  Discontinued        1 g 200 mL/hr over 30 Minutes Intravenous  Once 02/21/22 1713 02/21/22 1717   02/22/22 1200  cefTRIAXone (ROCEPHIN) 2 g in sodium chloride 0.9 % 100 mL IVPB  Status:  Discontinued        2 g 200 mL/hr over 30 Minutes Intravenous Every 24 hours 02/21/22 1713 02/24/22 1013   02/21/22 1815  cefTRIAXone (ROCEPHIN) 1 g in sodium chloride 0.9 % 100 mL IVPB        1 g 200 mL/hr over 30 Minutes Intravenous  Once 02/21/22 1717 02/22/22 0845   02/21/22 1215  cefTRIAXone (ROCEPHIN) 1 g in sodium chloride 0.9 % 100 mL IVPB        1 g 200 mL/hr over 30 Minutes Intravenous  Once 02/21/22 1202 02/21/22 1251   02/21/22 1215  azithromycin (ZITHROMAX) 500 mg in sodium chloride 0.9 % 250 mL IVPB        500 mg 250 mL/hr over 60 Minutes Intravenous  Once 02/21/22 1202 02/21/22 1449       Subjective: Patient seen and examined the bedside today.  Hemodynamically stable.  On room air today.  She was sleeping when I arrived.  She denies any worsening shortness of breath or cough.  Discussed with husband at bedside  Objective: Vitals:   02/25/22 0805 02/25/22 0900 02/25/22 0953 02/25/22 1000  BP:   125/63 (!) 122/58  Pulse:  65 64 63  Resp:  (!) 21 (!) 25 (!) 25  Temp:      TempSrc:      SpO2: 96% 91% 90% 93%  Weight:      Height:         Intake/Output Summary (Last 24 hours) at 02/25/2022 1128 Last data filed at 02/25/2022 1000 Gross per 24 hour  Intake 167.78 ml  Output 2400 ml  Net -2232.22 ml   Filed Weights   02/23/22 1741  Weight: 60 kg    Examination:  General exam: Overall comfortable, not in distress HEENT: PERRL Respiratory system: Mild diminished air sounds bilaterally, no wheeze or crackles Cardiovascular system: S1 & S2 heard, RRR.  Gastrointestinal system: Abdomen is nondistended, soft and nontender. Central nervous system: Alert and oriented Extremities: No edema, no clubbing ,no cyanosis Skin: No rashes, no ulcers,no icterus    Data Reviewed: I have personally reviewed following labs and imaging studies  CBC: Recent Labs  Lab 02/21/22 1017 02/21/22 1245 02/22/22 0248 02/23/22 0247 02/24/22 0439 02/25/22 0310  WBC 10.1  --  14.3* 19.5* 16.0* 10.8*  HGB 12.2 11.2* 11.2* 10.4* 10.7* 11.0*  HCT 36.7 33.0* 34.2* 32.2* 32.1* 33.8*  MCV 94.3  --  96.1 96.4 94.7 95.5  PLT 272  --  237 261 282 671   Basic Metabolic Panel: Recent Labs  Lab 02/21/22 1017 02/21/22 1245 02/22/22 0248 02/23/22 0247 02/24/22 0439 02/25/22 0310  NA 132* 130* 132* 132* 130* 130*  K 4.3 3.9 3.9 5.0 4.7 4.4  CL 96*  --  98 103 101 99  CO2 26  --  '22 22 22 22  '$ GLUCOSE 106*  --  203* 128* 92 133*  BUN 18  --  21 21 27* 31*  CREATININE 1.06*  --  1.22* 0.82 0.93 1.05*  CALCIUM 9.5  --  8.3* 9.0 9.0 9.1  MG  --   --  1.9 2.3  --   --      Recent Results (from the past 240 hour(s))  Culture, blood (Routine x 2)     Status: None (Preliminary result)   Collection Time: 02/21/22 10:32 AM   Specimen: BLOOD  Result Value Ref Range Status   Specimen Description   Final    BLOOD RIGHT ANTECUBITAL Performed at [facility], [address], [place], [place] [address]    Special Requests   Final    BOTTLES DRAWN AEROBIC AND ANAEROBIC Blood Culture adequate volume Performed at [facility], [address] [place], [place] [address]    Culture   Final    NO GROWTH 4 DAYS Performed at [facility], [place] [address] [place], [place] [address]    Report Status PENDING  Incomplete  SARS Coronavirus 2 by RT PCR (hospital order, performed in [facility] hospital lab) *cepheid single result test* Anterior Nasal Swab     Status: None   Collection Time: 02/21/22 11:34 AM   Specimen: Anterior Nasal Swab  Result Value Ref Range Status   SARS Coronavirus 2 by RT PCR NEGATIVE NEGATIVE Final    Comment: (NOTE) SARS-CoV-2 target nucleic acids are NOT DETECTED.  The SARS-CoV-2 RNA is generally detectable in upper and lower respiratory specimens during the acute phase of infection. The lowest concentration of SARS-CoV-2 viral copies this assay can detect is 250 copies / mL. A negative result does not preclude SARS-CoV-2 infection and should not be used as the sole basis for treatment or other patient management decisions.  A negative result may occur with improper specimen collection / handling, submission of specimen other than nasopharyngeal swab, presence of viral mutation(s) within the areas targeted by this assay, and inadequate number of viral copies (<250 copies / mL). A negative result must be combined with clinical observations, patient history, and epidemiological information.  Fact Sheet for Patients:   https://www.patel.info/  Fact Sheet for Healthcare Providers: https://hall.com/  This test is not yet approved or  cleared by the [place] FDA and has been authorized for detection and/or diagnosis of SARS-CoV-2 by FDA under an Emergency Use Authorization (EUA).  This EUA will remain in effect (meaning this test can be used) for the duration of the COVID-19 declaration under Section 564(b)(1) of the Act, 21 U.S.C. section 360bbb-3(b)(1), unless the authorization is terminated or revoked  sooner.  Performed at [facility], [address], [place], [place] [address]   Culture, blood (Routine x 2)     Status: None (Preliminary result)   Collection Time: 02/21/22 11:34 AM   Specimen: BLOOD  Result  Value Ref Range Status   Specimen Description   Final    BLOOD BLOOD LEFT HAND Performed at [facility], [address] [place], [place] [address]    Special Requests   Final    BOTTLES DRAWN AEROBIC AND ANAEROBIC Blood Culture adequate volume Performed at [facility], [address], [place], [place] [address]    Culture   Final    NO GROWTH 4 DAYS Performed at [facility], [place] [address] [place], [place] [address]    Report Status PENDING  Incomplete  MRSA Next Gen by PCR, Nasal     Status: None   Collection Time: 02/24/22 12:58 PM   Specimen: Nasal Mucosa; Nasal Swab  Result Value Ref Range Status   MRSA by PCR Next Gen NOT DETECTED NOT DETECTED Final    Comment: (NOTE) The GeneXpert MRSA Assay (FDA approved for NASAL specimens only), is one component of a comprehensive MRSA colonization surveillance program. It is not intended to diagnose MRSA infection nor to guide or monitor treatment for MRSA infections. Test performance is not FDA approved in patients less than 86 years old. Performed at [facility], [place] [address] [place], [place] [address]      Radiology Studies: DG CHEST PORT 1 VIEW  Result Date: 02/24/2022 CLINICAL DATA:  [id number] with shortness of breath. Currently being treated for pneumonia. EXAM: PORTABLE CHEST 1 VIEW COMPARISON:  Portable chest 02/21/2022 FINDINGS: 6:43 a.m. Left chest dual lead pacing system and wire insertions are unaltered. There is cardiomegaly. There is slight central vascular prominence with improvement. Interstitial and patchy hazy alveolar opacities of the right lung fields, scattered opacities in the left mid to lower lung field are again  noted, most dense opacities again in the right upper lobe where they are unchanged, elsewhere the opacities slightly improved. Small pleural effusions appear similar. There is no new or worsening infiltrate. Stable mediastinum with aortic atherosclerotic.  Osteopenia. IMPRESSION: 1. Improvement in central vascular prominence. 2. Interstitial and patchy hazy alveolar opacities in the right lung fields and scattered opacities in the left mid to lower lung field, slightly improved except in the right upper lobe. 3. Stable small pleural effusions. 4. Cardiomegaly. 5. Aortic atherosclerosis. Electronically Signed   By: Telford Nab M.D.   On: 02/24/2022 07:01    Scheduled Meds:  amiodarone  200 mg Oral QPM   apixaban  5 mg Oral BID   arformoterol  15 mcg Nebulization BID   ascorbic acid  500 mg Oral Daily   budesonide (PULMICORT) nebulizer solution  0.5 mg Nebulization BID   Chlorhexidine Gluconate Cloth  6 each Topical Daily   cholecalciferol  4,000 Units Oral QPM   empagliflozin  10 mg Oral QAC breakfast   fluticasone  1 spray Each Nare Daily   folic acid  1 mg Oral QPM   gabapentin  300 mg Oral TID   [START ON 02/26/2022] methylPREDNISolone (SOLU-MEDROL) injection  40 mg Intravenous Daily   metoprolol tartrate  25 mg Oral BID   revefenacin  175 mcg Nebulization Daily   vitamin E  400 Units Oral Daily   Continuous Infusions:  sodium chloride 10 mL/hr at 02/21/22 1220   piperacillin-tazobactam (ZOSYN)  IV 12.5 mL/hr at 02/25/22 1000     LOS: 4 days   Shelly Coss, MD [facility] P11/09/2021, 11:28 AM

## 2022-02-25 NOTE — Progress Notes (Addendum)
Physical Therapy Treatment Patient Details Name: Sheila Morales MRN: [id number] DOB: 1950-09-11 Today's Date: 02/25/2022   History of Present Illness Pt admitted from home with acute hypoxemis respiratory failure 2* CAP.  PT with hx of CHF, COPD, A-fib, SSS s/p pacemaker, SLE, peripheral neuropathy, and chronic hyponatremia    PT Comments    Pt received supine in bed agreeable to be seen, husband present.  Vitals prior to mobility: BP 160/80(109), HR67, SpO2 95% on RA, RR 18.  Pt required supervision for bed mobility with assistance with line management only. Required min guard for transfers and ambulation in hallway with RW, +2 for recliner follow. Required four seated rest breaks secondary to dyspnea and SOB but pt continues to converse during mobility despite cuing to focus on breathing; pt recovers saturations very quickly with seated rest break and pursed lip breathing. Mid-ambulation vitals: BP 161/75(108), SpO2 ranged 75-94% on RA. Distance limited by fatigue and oxygen saturations. Pt in recliner at exit with lunch and husband present. Vitals at exit: BP169/90, HR72, SpO299% on RA, RR20.  Discharge destination remains appropriate, we will continue to follow acutely.    Recommendations for follow up therapy are one component of a multi-disciplinary discharge planning process, led by the attending physician.  Recommendations may be updated based on patient status, additional functional criteria and insurance authorization.  Follow Up Recommendations  Outpatient PT (Pt is currently on case load with OP PT and plans to continue)     Assistance Recommended at Discharge Intermittent Supervision/Assistance  Patient can return home with the following A little help with walking and/or transfers;A little help with bathing/dressing/bathroom;Assist for transportation;Help with stairs or ramp for entrance;Assistance with cooking/housework   Equipment Recommendations  None recommended by PT     Recommendations for Other Services       Precautions / Restrictions Precautions Precautions: Fall Precaution Comments: watch SpO2 Restrictions Weight Bearing Restrictions: No     Mobility  Bed Mobility Overal bed mobility: Needs Assistance Bed Mobility: Supine to Sit     Supine to sit: Supervision     General bed mobility comments: For safety with HOB elevated, no physical assist required, line management.    Transfers Overall transfer level: Needs assistance Equipment used: Rolling walker (2 wheels) Transfers: Sit to/from Stand Sit to Stand: Min guard           General transfer comment: Min guard for safety only.    Ambulation/Gait Ambulation/Gait assistance: Min guard, +2 safety/equipment Gait Distance (Feet): 64 Feet Assistive device: Rolling walker (2 wheels) Gait Pattern/deviations: Step-through pattern, Decreased step length - right, Decreased step length - left, Shuffle, Trunk flexed Gait velocity: decreased     General Gait Details: Pt ambulated with Rw and min guard, no over LOB noted or physical assist required, cues for proximity to device and standing up tall. Pt required four seated rest breaks secondary to hypoxia, SpO2 monitored from 75%-94% on RA, seems to decrease while ambulating and talking at the same time. RN aware.   Stairs             Wheelchair Mobility    Modified Rankin (Stroke Patients Only)       Balance Overall balance assessment: Needs assistance Sitting-balance support: Feet supported, No upper extremity supported Sitting balance-Leahy Scale: Good     Standing balance support: No upper extremity supported Standing balance-Leahy Scale: Fair Standing balance comment: Reliant on RW during dynamic mobility  Cognition Arousal/Alertness: Awake/alert Behavior During Therapy: WFL for tasks assessed/performed, Impulsive Overall Cognitive Status: Within Functional Limits for tasks  assessed                                          Exercises      General Comments General comments (skin integrity, edema, etc.): Husband [name] present      Pertinent Vitals/Pain Pain Assessment Pain Assessment: No/denies pain    Home Living                          Prior Function            PT Goals (current goals can now be found in the care plan section) Acute Rehab PT Goals Patient Stated Goal: HOME PT Goal Formulation: With patient Time For Goal Achievement: 03/09/22 Potential to Achieve Goals: Good Progress towards PT goals: Progressing toward goals    Frequency    Min 3X/week      PT Plan Current plan remains appropriate    Co-evaluation              AM-PAC PT "6 Clicks" Mobility   Outcome Measure  Help needed turning from your back to your side while in a flat bed without using bedrails?: None Help needed moving from lying on your back to sitting on the side of a flat bed without using bedrails?: None Help needed moving to and from a bed to a chair (including a wheelchair)?: A Little Help needed standing up from a chair using your arms (e.g., wheelchair or bedside chair)?: A Little Help needed to walk in hospital room?: A Little Help needed climbing 3-5 steps with a railing? : A Little 6 Click Score: 20    End of Session Equipment Utilized During Treatment: Gait belt Activity Tolerance: Patient tolerated treatment well;Patient limited by fatigue Patient left: in chair;with call bell/phone within reach;with family/visitor present;with nursing/sitter in room Nurse Communication: Mobility status PT Visit Diagnosis: Unsteadiness on feet (R26.81);Difficulty in walking, not elsewhere classified (R26.2);Muscle weakness (generalized) (M62.81)     Time: 2409-7353 PT Time Calculation (min) (ACUTE ONLY): 30 min  Charges:  $Gait Training: 23-37 mins                     Coolidge Breeze, PT, DPT [facility]  Department Office: [phone] Weekend pager: [phone]   Coolidge Breeze 02/25/2022, 1:16 PM

## 2022-02-25 NOTE — TOC Initial Note (Signed)
Transition of Care [facility]) - Initial/Assessment Note    Patient Details  Name: Sheila Morales MRN: [id number] Date of Birth: 03/10/1951  Transition of Care [facility]) CM/SW Contact:    Leeroy Cha, RN Phone Number: 02/25/2022, 9:15 AM  Clinical Narrative:                  Transition of Care [facility]) Screening Note   Patient Details  Name: Sheila Morales Date of Birth: 02-04-1951   Transition of Care [facility]) CM/SW Contact:    Leeroy Cha, RN Phone Number: 02/25/2022, 9:15 AM    Transition of Care Department [facility]) has reviewed patient and no TOC needs have been identified at this time. We will continue to monitor patient advancement through interdisciplinary progression rounds. If new patient transition needs arise, please place a TOC consult.    Expected Discharge Plan: Home/Self Care Barriers to Discharge: Continued Medical Work up   Patient Goals and CMS Choice     Choice offered to / list presented to : Patient  Expected Discharge Plan and Services Expected Discharge Plan: Home/Self Care   Discharge Planning Services: CM Consult   Living arrangements for the past 2 months: Single Family Home                                      Prior Living Arrangements/Services Living arrangements for the past 2 months: Single Family Home Lives with:: Self Patient language and need for interpreter reviewed:: Yes Do you feel safe going back to the place where you live?: Yes            Criminal Activity/Legal Involvement Pertinent to Current Situation/Hospitalization: No - Comment as needed  Activities of Daily Living Home Assistive Devices/Equipment: Blood pressure cuff ADL Screening (condition at time of admission) Patient's cognitive ability adequate to safely complete daily activities?: Yes Is the patient deaf or have difficulty hearing?: Yes Does the patient have difficulty seeing, even when wearing glasses/contacts?: No Does the patient have difficulty  concentrating, remembering, or making decisions?: No Patient able to express need for assistance with ADLs?: Yes Does the patient have difficulty dressing or bathing?: Yes Independently performs ADLs?: No Does the patient have difficulty walking or climbing stairs?: Yes Weakness of Legs: Both Weakness of Arms/Hands: Both  Permission Sought/Granted                  Emotional Assessment Appearance:: Appears stated age Attitude/Demeanor/Rapport: Engaged Affect (typically observed): Calm Orientation: : Oriented to Self, Oriented to Place, Oriented to  Time, Oriented to Situation Alcohol / Substance Use: Alcohol Use, Tobacco Use (no current smoking or etoh intake) Psych Involvement: No (comment)  Admission diagnosis:  CAP (community acquired pneumonia) [J18.9] Pneumonia of right upper lobe due to infectious organism [J18.9] Patient Active Problem List   Diagnosis Date Noted   CAP (community acquired pneumonia) 02/21/2022   Snoring 12/18/2021   Interstitial lung disease ([place]) 04/23/2021   AF (paroxysmal atrial fibrillation) ([place]) 03/28/2021   Chronic anticoagulation 03/28/2021   Pulmonary vascular congestion 03/21/2021   Chronic diastolic CHF (congestive heart failure) ([place]) 03/21/2021   Pacemaker 09/07/2020   Left foot pain 06/30/2020   Sinus node dysfunction ([place]) 05/26/2020   Bradycardia 05/11/2020   Pain in right lower leg 02/18/2020   Episodic lightheadedness 02/02/2020   Hypertension 02/02/2020   LLQ pain 11/23/2019   Methotrexate, long term, current use 07/29/2019  Community acquired pneumonia 02/05/2018   BPPV (benign paroxysmal positional vertigo) 12/16/2017   Acute hypoxemic respiratory failure ([place]) 11/05/2017   Hyponatremia 11/01/2017   Insomnia 02/24/2017   COPD (chronic obstructive pulmonary disease) ([place]) 01/22/2017   Hereditary and idiopathic peripheral neuropathy 02/17/2015   Smokers' cough ([place]) 12/07/2014   Systemic lupus erythematosus ([place])  10/13/2013   Irritable bowel syndrome 03/25/2008   History of rheumatic fever as a child 01/17/2007   PCP:  Hoyt Koch, MD Pharmacy:   [id number]: [phone]: [phone]    Social Determinants of Health (SDOH) Interventions    Readmission Risk Interventions   No data to display

## 2022-02-25 NOTE — Progress Notes (Signed)
Pt seen, asleep, resting comfortably, no increased wob noted.  HR67, RR22, spo2 93%.  Bipap not indicated at this time.

## 2022-02-26 DIAGNOSIS — J189 Pneumonia, unspecified organism: Secondary | ICD-10-CM | POA: Diagnosis not present

## 2022-02-26 LAB — CBC
HCT: 33.7 % — ABNORMAL LOW (ref 36.0–46.0)
Hemoglobin: 10.9 g/dL — ABNORMAL LOW (ref 12.0–15.0)
MCH: 31 pg (ref 26.0–34.0)
MCHC: 32.3 g/dL (ref 30.0–36.0)
MCV: 95.7 fL (ref 80.0–100.0)
Platelets: 307 K/uL (ref 150–400)
RBC: 3.52 MIL/uL — ABNORMAL LOW (ref 3.87–5.11)
RDW: 16 % — ABNORMAL HIGH (ref 11.5–15.5)
WBC: 14.1 K/uL — ABNORMAL HIGH (ref 4.0–10.5)
nRBC: 0 % (ref 0.0–0.2)

## 2022-02-26 LAB — BASIC METABOLIC PANEL WITH GFR
Anion gap: 8 (ref 5–15)
BUN: 27 mg/dL — ABNORMAL HIGH (ref 8–23)
CO2: 25 mmol/L (ref 22–32)
Calcium: 8.9 mg/dL (ref 8.9–10.3)
Chloride: 99 mmol/L (ref 98–111)
Creatinine, Ser: 1.14 mg/dL — ABNORMAL HIGH (ref 0.44–1.00)
GFR, Estimated: 51 mL/min — ABNORMAL LOW
Glucose, Bld: 108 mg/dL — ABNORMAL HIGH (ref 70–99)
Potassium: 4.3 mmol/L (ref 3.5–5.1)
Sodium: 132 mmol/L — ABNORMAL LOW (ref 135–145)

## 2022-02-26 LAB — CULTURE, BLOOD (ROUTINE X 2)
Culture: NO GROWTH
Culture: NO GROWTH
Special Requests: ADEQUATE
Special Requests: ADEQUATE

## 2022-02-26 LAB — CUP PACEART REMOTE DEVICE CHECK
Date Time Interrogation Session: 20231107075502
Implantable Lead Connection Status: 753985
Implantable Lead Connection Status: 753985
Implantable Lead Implant Date: 20220204
Implantable Lead Implant Date: 20220204
Implantable Lead Location: 753859
Implantable Lead Location: 753860
Implantable Lead Model: 377169
Implantable Lead Model: 377171
Implantable Lead Serial Number: 8000143637
Implantable Lead Serial Number: 8000151447
Implantable Pulse Generator Implant Date: 20220204
Pulse Gen Model: 407145
Pulse Gen Serial Number: 70020732

## 2022-02-26 MED ORDER — PREDNISONE 20 MG PO TABS: 20.0000 mg | ORAL_TABLET | Freq: Every day | ORAL | Status: DC

## 2022-02-26 MED ORDER — AMOXICILLIN-POT CLAVULANATE 875-125 MG PO TABS
1.0000 | ORAL_TABLET | Freq: Two times a day (BID) | ORAL | Status: DC
  Administered 2022-02-26 – 2022-03-02 (×9): 1 via ORAL
  Filled 2022-02-26 (×9): qty 1

## 2022-02-26 MED ORDER — DOXYCYCLINE HYCLATE 100 MG PO TABS
100.0000 mg | ORAL_TABLET | Freq: Two times a day (BID) | ORAL | Status: DC
  Administered 2022-02-26 – 2022-03-02 (×9): 100 mg via ORAL
  Filled 2022-02-26 (×9): qty 1

## 2022-02-26 MED ORDER — LEVOFLOXACIN 750 MG PO TABS
750.0000 mg | ORAL_TABLET | ORAL | Status: DC
  Filled 2022-02-26: qty 1

## 2022-02-26 MED ORDER — PREDNISONE 20 MG PO TABS
30.0000 mg | ORAL_TABLET | Freq: Every day | ORAL | Status: DC
  Administered 2022-03-01 – 2022-03-02 (×2): 30 mg via ORAL
  Filled 2022-02-26 (×2): qty 1

## 2022-02-26 MED ORDER — LEVOFLOXACIN 500 MG PO TABS: 750.0000 mg | ORAL_TABLET | Freq: Every day | ORAL | Status: DC

## 2022-02-26 MED ORDER — FUROSEMIDE 20 MG PO TABS: 20.0000 mg | ORAL_TABLET | Freq: Every day | ORAL | Status: DC

## 2022-02-26 MED ORDER — PREDNISONE 20 MG PO TABS
40.0000 mg | ORAL_TABLET | Freq: Every day | ORAL | Status: AC
  Administered 2022-02-26 – 2022-02-28 (×3): 40 mg via ORAL
  Filled 2022-02-26 (×3): qty 2

## 2022-02-26 MED ORDER — PREDNISONE 10 MG PO TABS: 10.0000 mg | ORAL_TABLET | Freq: Every day | ORAL | Status: DC

## 2022-02-26 MED ORDER — PREDNISONE 20 MG PO TABS: 40.0000 mg | ORAL_TABLET | Freq: Every day | ORAL | Status: DC

## 2022-02-26 NOTE — Progress Notes (Signed)
PHARMACY NOTE:  ANTIMICROBIAL RENAL DOSAGE ADJUSTMENT  Current antimicrobial regimen includes a mismatch between antimicrobial dosage and estimated renal function.  As per policy approved by the Pharmacy & Therapeutics and Medical Executive Committees, the antimicrobial dosage will be adjusted accordingly.  Current antimicrobial dosage:  Levofloxacin '750mg'$  PO daily x 5 days  Indication: pneumonia  Renal Function:  Estimated Creatinine Clearance: 35.3 mL/min (A) (by C-G formula based on SCr of 1.14 mg/dL (H)). '[]'$      On intermittent HD, scheduled: '[]'$      On CRRT    Antimicrobial dosage has been changed to:  Levofloxacin '750mg'$  PO q48h x 5 days   Thank you for allowing pharmacy to be a part of this patient's care.  Luiz Ochoa, [facility] 02/26/2022 8:05 AM

## 2022-02-26 NOTE — Progress Notes (Signed)
PROGRESS NOTE  Sheila Morales  ZSW:[id number] DOB: [birth year] DOA: 02/21/2022 PCP: Hoyt Koch, MD   Brief Narrative: Patient is  Bossard is a 71 y.o. female with medical history significant of COPD, SLE on methotrexate, interstitial lung disease/eosinophilic pneumonia, chronic diastolic CHF, paroxysmal A-fib, sick sinus syndrome status post pacemaker placement, chronic hyponatremia who presented to the emergency department at [facility] with complaint of fever, weakness, dyspnea, cough. Patient uses chronically 2 L of oxygen as needed at home for her COPD/ILD.  Chest x-ray showed right upper lobe infiltrate Patient was admitted for the management of community-acquired pneumonia.  Hospital course remarkable for persistent dyspnea, respiratory distress requiring BiPAP, had to be transferred to ICU on 11/5.   Respiratory status  is improving again, being transferred to floor today.  Still not ready for discharge.Might take 1-2 more days  Assessment & Plan:  Principal Problem:   CAP (community acquired pneumonia) Active Problems:   Systemic lupus erythematosus (HCC)   COPD (chronic obstructive pulmonary disease) (HCC)   Hyponatremia   Acute respiratory failure with hypoxia (HCC)   Chronic diastolic CHF (congestive heart failure) (HCC)   AF (paroxysmal atrial fibrillation) ([place])   Community acquired pneumonia: Presented with dyspnea, cough, fever, low appetite.  Chest x-ray showed possible right upper lobe pneumonia.  Initially started on ceftriaxone, azithromycin.Changed to Vanco and Zosyn.  Negative Streptococcus pneumoniae antigen,  Legionella antigen.  Blood culture negative so far. Chest x-ray done for follow-up on 11/5 showed interstitial and patchy hazy alveolar opacities in the right lung fields and scattered opacities in the left mid to lower lung field, slightly improved except in the right upper lobe. Her respiratory status has improved since 11/6.  Antibiotics changed to  levofloxacin  Sepsis: Had leukocytosis, elevated procalcitonin.  Continue current antibiotics.  Follow-up cultures.  No growth till date.  Afebrile, blood pressure stable.  Leukocytosis is again seen, most likely contributed by steroids   Acute on chronic hypoxic respiratory failure: Secondary to pneumonia and COPD exacerbation.  Uses 2 L of oxygen at home as needed .Patient became hypoxic and went to respiratory distress on the night of 11/3, had to put on BiPAP.  In the morning of 11/5, she again went into respiratory distress and had to be put on BiPAP, transferred ICU.  This morning she is on 2 L, yesterday she was on room air  History of COPD/ILD:  Follows with pulmonology for eosinophilic pneumonia.Continue bronchodilators as needed needed, home inhalers.  Continue slow prednisone taper.  She will follow-up with pulmonology as an outpatient   SLE: Takes methotrexate.   History of paroxysmal A-fib: Currently in normal sinus rhythm.  Status post pacemaker for sick sinus syndrome.  On Eliquis for anticoagulation. Takes metoprolol for rate control.   Chronic diastolic CHF: No evidence of fluid overload currently, takes 20 mg of Lasix at home, currently on hold, we will resume on discharge depending on tomorrow's BMP   Chronic hyponatremia: On reviewing her previous labs, she has chronic hyponatremia.  Currently sodium stable at 130s.    Peripheral neuropathy: On gabapentin   Weakness/deconditioning: We have requested PT evaluation,recommended HH        DVT prophylaxis: apixaban (ELIQUIS) tablet 5 mg     Code Status: Full Code  Family Communication: Husband at bedside daily  Patient status:Inpatient  Patient is from :Home  Anticipated discharge DU:KGUR  Estimated DC date: 1 to 2 days   Consultants: PCCM  Procedures:None  Antimicrobials:  Anti-infectives (From admission, onward)  Start     Dose/Rate Route Frequency Ordered Stop   02/26/22 1400  levofloxacin  (LEVAQUIN) tablet 750 mg        750 mg Oral Every 48 hours 02/26/22 0807 03/04/22 1359   02/26/22 1000  levofloxacin (LEVAQUIN) tablet 750 mg  Status:  Discontinued        750 mg Oral Daily 02/26/22 0726 02/26/22 0807   02/25/22 1200  vancomycin (VANCOREADY) IVPB 750 mg/150 mL  Status:  Discontinued        750 mg 150 mL/hr over 60 Minutes Intravenous Every 24 hours 02/24/22 1032 02/25/22 1033   02/24/22 1130  piperacillin-tazobactam (ZOSYN) IVPB 3.375 g  Status:  Discontinued        3.375 g 12.5 mL/hr over 240 Minutes Intravenous Every 8 hours 02/24/22 1032 02/26/22 0726   02/24/22 1115  vancomycin (VANCOREADY) IVPB 1500 mg/300 mL        1,500 mg 150 mL/hr over 120 Minutes Intravenous  Once 02/24/22 1022 02/24/22 1234   02/22/22 1400  azithromycin (ZITHROMAX) 500 mg in sodium chloride 0.9 % 250 mL IVPB  Status:  Discontinued        500 mg 250 mL/hr over 60 Minutes Intravenous Every 24 hours 02/21/22 1713 02/24/22 1013   02/22/22 1200  cefTRIAXone (ROCEPHIN) 1 g in sodium chloride 0.9 % 100 mL IVPB  Status:  Discontinued        1 g 200 mL/hr over 30 Minutes Intravenous  Once 02/21/22 1713 02/21/22 1717   02/22/22 1200  cefTRIAXone (ROCEPHIN) 2 g in sodium chloride 0.9 % 100 mL IVPB  Status:  Discontinued        2 g 200 mL/hr over 30 Minutes Intravenous Every 24 hours 02/21/22 1713 02/24/22 1013   02/21/22 1815  cefTRIAXone (ROCEPHIN) 1 g in sodium chloride 0.9 % 100 mL IVPB        1 g 200 mL/hr over 30 Minutes Intravenous  Once 02/21/22 1717 02/22/22 0845   02/21/22 1215  cefTRIAXone (ROCEPHIN) 1 g in sodium chloride 0.9 % 100 mL IVPB        1 g 200 mL/hr over 30 Minutes Intravenous  Once 02/21/22 1202 02/21/22 1251   02/21/22 1215  azithromycin (ZITHROMAX) 500 mg in sodium chloride 0.9 % 250 mL IVPB        500 mg 250 mL/hr over 60 Minutes Intravenous  Once 02/21/22 1202 02/21/22 1449       Subjective: Patient seen and examined at bedside today.  Hemodynamically stable.  On 2 L of  oxygen.  Not in apparent respiratory distress.  Looks slightly tachypneic and anxious.  Husband at bedside.  She does not complain of any worsening shortness of breath or cough.  Complains of weakness  Objective: Vitals:   02/26/22 0855 02/26/22 0857 02/26/22 0900 02/26/22 1019  BP:   (!) 124/51 126/61  Pulse:   65 66  Resp:   (!) 29   Temp:      TempSrc:      SpO2: 97% 95% 97%   Weight:      Height:        Intake/Output Summary (Last 24 hours) at 02/26/2022 1022 Last data filed at 02/26/2022 0908 Gross per 24 hour  Intake 154.22 ml  Output 350 ml  Net -195.78 ml   Filed Weights   02/23/22 1741  Weight: 60 kg    Examination:  General exam: Overall comfortable, not in distress, weak HEENT: PERRL Respiratory system: Diminished sounds bilaterally but  no frank wheezing or crackles.  Mildly tachypneic. Cardiovascular system: S1 & S2 heard, RRR.  Gastrointestinal system: Abdomen is nondistended, soft and nontender. Central nervous system: Alert and oriented Extremities: No edema, no clubbing ,no cyanosis Skin: No rashes, no ulcers,no icterus      Data Reviewed: I have personally reviewed following labs and imaging studies  CBC: Recent Labs  Lab 02/22/22 0248 02/23/22 0247 02/24/22 0439 02/25/22 0310 02/26/22 0731  WBC 14.3* 19.5* 16.0* 10.8* 14.1*  HGB 11.2* 10.4* 10.7* 11.0* 10.9*  HCT 34.2* 32.2* 32.1* 33.8* 33.7*  MCV 96.1 96.4 94.7 95.5 95.7  PLT 237 261 282 281 235   Basic Metabolic Panel: Recent Labs  Lab 02/22/22 0248 02/23/22 0247 02/24/22 0439 02/25/22 0310 02/26/22 0731  NA 132* 132* 130* 130* 132*  K 3.9 5.0 4.7 4.4 4.3  CL 98 103 101 99 99  CO2 '22 22 22 22 25  '$ GLUCOSE 203* 128* 92 133* 108*  BUN 21 21 27* 31* 27*  CREATININE 1.22* 0.82 0.93 1.05* 1.14*  CALCIUM 8.3* 9.0 9.0 9.1 8.9  MG 1.9 2.3  --   --   --      Recent Results (from the past 240 hour(s))  Culture, blood (Routine x 2)     Status: None   Collection Time: 02/21/22 10:32 AM    Specimen: BLOOD  Result Value Ref Range Status   Specimen Description   Final    BLOOD RIGHT ANTECUBITAL Performed at [facility], [address] [place], [place] [address]    Special Requests   Final    BOTTLES DRAWN AEROBIC AND ANAEROBIC Blood Culture adequate volume Performed at [facility], [address] [place], [place] [address]    Culture   Final    NO GROWTH 5 DAYS Performed at [facility], [place] [address] [place], [place] [address]    Report Status 02/26/2022 FINAL  Final  SARS Coronavirus 2 by RT PCR (hospital order, performed in [facility] hospital lab) *cepheid single result test* Anterior Nasal Swab     Status: None   Collection Time: 02/21/22 11:34 AM   Specimen: Anterior Nasal Swab  Result Value Ref Range Status   SARS Coronavirus 2 by RT PCR NEGATIVE NEGATIVE Final    Comment: (NOTE) SARS-CoV-2 target nucleic acids are NOT DETECTED.  The SARS-CoV-2 RNA is generally detectable in upper and lower respiratory specimens during the acute phase of infection. The lowest concentration of SARS-CoV-2 viral copies this assay can detect is 250 copies / mL. A negative result does not preclude SARS-CoV-2 infection and should not be used as the sole basis for treatment or other patient management decisions.  A negative result may occur with improper specimen collection / handling, submission of specimen other than nasopharyngeal swab, presence of viral mutation(s) within the areas targeted by this assay, and inadequate number of viral copies (<250 copies / mL). A negative result must be combined with clinical observations, patient history, and epidemiological information.  Fact Sheet for Patients:   https://www.patel.info/  Fact Sheet for Healthcare Providers: https://hall.com/  This test is not yet approved or  cleared by the [place] FDA and has been authorized for detection and/or  diagnosis of SARS-CoV-2 by FDA under an Emergency Use Authorization (EUA).  This EUA will remain in effect (meaning this test can be used) for the duration of the COVID-19 declaration under Section 564(b)(1) of the Act, 21 U.S.C. section 360bbb-3(b)(1), unless the authorization is terminated  or revoked sooner.  Performed at [facility], [address], [place], [place] [address]   Culture, blood (Routine x 2)     Status: None   Collection Time: 02/21/22 11:34 AM   Specimen: BLOOD  Result Value Ref Range Status   Specimen Description   Final    BLOOD BLOOD LEFT HAND Performed at [facility], [address] [place], [place] [address]    Special Requests   Final    BOTTLES DRAWN AEROBIC AND ANAEROBIC Blood Culture adequate volume Performed at [facility], [address] [place], [place] [address]    Culture   Final    NO GROWTH 5 DAYS Performed at [facility], [place] [address], [place], [place] [address]    Report Status 02/26/2022 FINAL  Final  MRSA Next Gen by PCR, Nasal     Status: None   Collection Time: 02/24/22 12:58 PM   Specimen: Nasal Mucosa; Nasal Swab  Result Value Ref Range Status   MRSA by PCR Next Gen NOT DETECTED NOT DETECTED Final    Comment: (NOTE) The GeneXpert MRSA Assay (FDA approved for NASAL specimens only), is one component of a comprehensive MRSA colonization surveillance program. It is not intended to diagnose MRSA infection nor to guide or monitor treatment for MRSA infections. Test performance is not FDA approved in patients less than 28 years old. Performed at [facility], [place] [address], [place], [place] [address]   Respiratory (~20 pathogens) panel by PCR     Status: None   Collection Time: 02/25/22  2:39 PM   Specimen: Nasopharyngeal Swab; Respiratory  Result Value Ref Range Status   Adenovirus NOT DETECTED NOT DETECTED Final   Coronavirus 229E NOT  DETECTED NOT DETECTED Final    Comment: (NOTE) The Coronavirus on the Respiratory Panel, DOES NOT test for the novel  Coronavirus (2019 nCoV)    Coronavirus HKU1 NOT DETECTED NOT DETECTED Final   Coronavirus NL63 NOT DETECTED NOT DETECTED Final   Coronavirus OC43 NOT DETECTED NOT DETECTED Final   Metapneumovirus NOT DETECTED NOT DETECTED Final   Rhinovirus / Enterovirus NOT DETECTED NOT DETECTED Final   Influenza A NOT DETECTED NOT DETECTED Final   Influenza B NOT DETECTED NOT DETECTED Final   Parainfluenza Virus 1 NOT DETECTED NOT DETECTED Final   Parainfluenza Virus 2 NOT DETECTED NOT DETECTED Final   Parainfluenza Virus 3 NOT DETECTED NOT DETECTED Final   Parainfluenza Virus 4 NOT DETECTED NOT DETECTED Final   Respiratory Syncytial Virus NOT DETECTED NOT DETECTED Final   Bordetella pertussis NOT DETECTED NOT DETECTED Final   Bordetella Parapertussis NOT DETECTED NOT DETECTED Final   Chlamydophila pneumoniae NOT DETECTED NOT DETECTED Final   Mycoplasma pneumoniae NOT DETECTED NOT DETECTED Final    Comment: Performed at [facility], [place]. [address], [place], [place] [address]     Radiology Studies: No results found.  Scheduled Meds:  amiodarone  200 mg Oral QPM   apixaban  5 mg Oral BID   arformoterol  15 mcg Nebulization BID   ascorbic acid  500 mg Oral Daily   budesonide (PULMICORT) nebulizer solution  0.5 mg Nebulization BID   Chlorhexidine Gluconate Cloth  6 each Topical Daily   cholecalciferol  4,000 Units Oral QPM   empagliflozin  10 mg Oral QAC breakfast   fluticasone  1 spray Each Nare Daily   folic acid  1 mg Oral QPM   gabapentin  300 mg Oral  TID   levofloxacin  750 mg Oral Q48H   metoprolol tartrate  25 mg Oral BID   [START ON 03/07/2022] predniSONE  10 mg Oral Q breakfast   [START ON 03/04/2022] predniSONE  20 mg Oral Q breakfast   [START ON 03/01/2022] predniSONE  30 mg Oral Q breakfast   predniSONE  40 mg Oral Q breakfast   revefenacin  175 mcg  Nebulization Daily   vitamin E  400 Units Oral Daily   Continuous Infusions:  sodium chloride 10 mL/hr at 02/21/22 1220     LOS: 5 days   Shelly Coss, MD [facility] P11/10/2021, 10:22 AM

## 2022-02-27 ENCOUNTER — Inpatient Hospital Stay (HOSPITAL_COMMUNITY): Payer: PPO

## 2022-02-27 DIAGNOSIS — I480 Paroxysmal atrial fibrillation: Secondary | ICD-10-CM

## 2022-02-27 DIAGNOSIS — I5032 Chronic diastolic (congestive) heart failure: Secondary | ICD-10-CM

## 2022-02-27 DIAGNOSIS — J9601 Acute respiratory failure with hypoxia: Secondary | ICD-10-CM | POA: Diagnosis not present

## 2022-02-27 DIAGNOSIS — M3213 Lung involvement in systemic lupus erythematosus: Secondary | ICD-10-CM

## 2022-02-27 DIAGNOSIS — J189 Pneumonia, unspecified organism: Secondary | ICD-10-CM | POA: Diagnosis not present

## 2022-02-27 DIAGNOSIS — E871 Hypo-osmolality and hyponatremia: Secondary | ICD-10-CM

## 2022-02-27 LAB — CBC
HCT: 32.7 % — ABNORMAL LOW (ref 36.0–46.0)
Hemoglobin: 10.8 g/dL — ABNORMAL LOW (ref 12.0–15.0)
MCH: 31.6 pg (ref 26.0–34.0)
MCHC: 33 g/dL (ref 30.0–36.0)
MCV: 95.6 fL (ref 80.0–100.0)
Platelets: 306 10*3/uL (ref 150–400)
RBC: 3.42 MIL/uL — ABNORMAL LOW (ref 3.87–5.11)
RDW: 16.1 % — ABNORMAL HIGH (ref 11.5–15.5)
WBC: 13.5 10*3/uL — ABNORMAL HIGH (ref 4.0–10.5)
nRBC: 0 % (ref 0.0–0.2)

## 2022-02-27 LAB — BASIC METABOLIC PANEL
Anion gap: 7 (ref 5–15)
BUN: 31 mg/dL — ABNORMAL HIGH (ref 8–23)
CO2: 26 mmol/L (ref 22–32)
Calcium: 8.9 mg/dL (ref 8.9–10.3)
Chloride: 100 mmol/L (ref 98–111)
Creatinine, Ser: 1.05 mg/dL — ABNORMAL HIGH (ref 0.44–1.00)
GFR, Estimated: 56 mL/min — ABNORMAL LOW (ref >60)
Glucose, Bld: 142 mg/dL — ABNORMAL HIGH (ref 70–99)
Potassium: 4.4 mmol/L (ref 3.5–5.1)
Sodium: 133 mmol/L — ABNORMAL LOW (ref 135–145)

## 2022-02-27 LAB — BRAIN NATRIURETIC PEPTIDE: B Natriuretic Peptide: 1399.4 pg/mL — ABNORMAL HIGH (ref 0.0–100.0)

## 2022-02-27 MED ORDER — HYDRALAZINE HCL 20 MG/ML IJ SOLN
5.0000 mg | Freq: Once | INTRAMUSCULAR | Status: AC
  Administered 2022-02-27: 5 mg via INTRAVENOUS

## 2022-02-27 MED ORDER — HYDRALAZINE HCL 20 MG/ML IJ SOLN
INTRAMUSCULAR | Status: AC
  Filled 2022-02-27: qty 1

## 2022-02-27 MED ORDER — IOHEXOL 350 MG/ML SOLN
75.0000 mL | Freq: Once | INTRAVENOUS | Status: AC | PRN
  Administered 2022-02-27: 75 mL via INTRAVENOUS

## 2022-02-27 MED ORDER — HYDRALAZINE HCL 20 MG/ML IJ SOLN
10.0000 mg | Freq: Four times a day (QID) | INTRAMUSCULAR | Status: DC | PRN
  Administered 2022-02-28 – 2022-03-02 (×3): 10 mg via INTRAVENOUS
  Filled 2022-02-27 (×3): qty 1

## 2022-02-27 MED ORDER — SODIUM CHLORIDE (PF) 0.9 % IJ SOLN
INTRAMUSCULAR | Status: AC
  Filled 2022-02-27: qty 50

## 2022-02-27 MED ORDER — CHLORHEXIDINE GLUCONATE CLOTH 2 % EX PADS
6.0000 | MEDICATED_PAD | Freq: Every day | CUTANEOUS | Status: DC
  Administered 2022-02-27 – 2022-03-02 (×3): 6 via TOPICAL

## 2022-02-27 NOTE — Plan of Care (Signed)

## 2022-02-27 NOTE — Progress Notes (Signed)
PROGRESS NOTE  Sheila Morales  KVQ:[id number] DOB: 02/27/1951 DOA: 02/21/2022 PCP: Hoyt Koch, MD   Brief Narrative: Patient is  Thurlow is a 71 y.o. female with medical history significant of COPD, SLE on methotrexate, interstitial lung disease/eosinophilic pneumonia, chronic diastolic CHF, paroxysmal A-fib, sick sinus syndrome status post pacemaker placement, chronic hyponatremia who presented with complaint of fever, weakness, dyspnea, cough. Patient uses chronically 2 L of oxygen as needed at home for her COPD/ILD. Chest x-ray showed right upper lobe infiltrate. Patient admitted for further management. Hospital course remarkable for persistent dyspnea, respiratory distress requiring BiPAP, had to be transferred to ICU on 11/5.      Assessment & Plan:  Principal Problem:   CAP (community acquired pneumonia) Active Problems:   Systemic lupus erythematosus (HCC)   COPD (chronic obstructive pulmonary disease) (HCC)   Hyponatremia   Acute respiratory failure with hypoxia (HCC)   Chronic diastolic CHF (congestive heart failure) (HCC)   AF (paroxysmal atrial fibrillation) ([place])   Sepsis likely 2/2 community acquired pneumonia Acute on chronic hypoxic respiratory failure (on 2L of O2 prn at home) Secondary to pneumonia and COPD exacerbation Chest x-ray showed possible right upper lobe pneumonia with repeat showing similar with mild improvement D-dimer mildly elevated CTA chest pending Negative Streptococcus pneumoniae antigen,  Legionella antigen Blood culture negative so far Respiratory viral panel negative Antibiotics changed to levofloxacin Still requiring as needed BiPAP as well as supplemental O2, continue Monitor closely in SDU   History of COPD/ILD Follows with pulmonology for eosinophilic pneumonia Continue bronchodilators as needed, home inhalers Continue slow prednisone taper Follow-up with pulmonology as an outpatient   SLE On methotrexate   History of  paroxysmal A-fib Currently in normal sinus rhythm Status post pacemaker for sick sinus syndrome On Eliquis for anticoagulation. Takes metoprolol for rate control   Hx of Chronic diastolic CHF BNP pending Unsure of vol status, does not appear overtly overloaded Lasix has been on hold, takes 20 mg of Lasix at home May need to restart pending BNP and CT chest   Chronic hyponatremia Noted chronic hyponatremia    Peripheral neuropathy On gabapentin   Weakness/deconditioning PT/OT     DVT prophylaxis: apixaban (ELIQUIS) tablet 5 mg     Code Status: Full Code  Family Communication: Husband at bedside daily  Patient status:Inpatient  Patient is from :Home  Anticipated discharge IE:PPIR  Estimated DC date: TBD   Consultants: PCCM  Procedures:None  Antimicrobials:  Anti-infectives (From admission, onward)    Start     Dose/Rate Route Frequency Ordered Stop   02/26/22 1400  levofloxacin (LEVAQUIN) tablet 750 mg  Status:  Discontinued        750 mg Oral Every 48 hours 02/26/22 0807 02/26/22 1101   02/26/22 1200  amoxicillin-clavulanate (AUGMENTIN) 875-125 MG per tablet 1 tablet        1 tablet Oral Every 12 hours 02/26/22 1101     02/26/22 1200  doxycycline (VIBRA-TABS) tablet 100 mg        100 mg Oral Every 12 hours 02/26/22 1101     02/26/22 1000  levofloxacin (LEVAQUIN) tablet 750 mg  Status:  Discontinued        750 mg Oral Daily 02/26/22 0726 02/26/22 0807   02/25/22 1200  vancomycin (VANCOREADY) IVPB 750 mg/150 mL  Status:  Discontinued        750 mg 150 mL/hr over 60 Minutes Intravenous Every 24 hours 02/24/22 1032 02/25/22 1033   02/24/22 1130  piperacillin-tazobactam (ZOSYN)  IVPB 3.375 g  Status:  Discontinued        3.375 g 12.5 mL/hr over 240 Minutes Intravenous Every 8 hours 02/24/22 1032 02/26/22 0726   02/24/22 1115  vancomycin (VANCOREADY) IVPB 1500 mg/300 mL        1,500 mg 150 mL/hr over 120 Minutes Intravenous  Once 02/24/22 1022 02/24/22 1234    02/22/22 1400  azithromycin (ZITHROMAX) 500 mg in sodium chloride 0.9 % 250 mL IVPB  Status:  Discontinued        500 mg 250 mL/hr over 60 Minutes Intravenous Every 24 hours 02/21/22 1713 02/24/22 1013   02/22/22 1200  cefTRIAXone (ROCEPHIN) 1 g in sodium chloride 0.9 % 100 mL IVPB  Status:  Discontinued        1 g 200 mL/hr over 30 Minutes Intravenous  Once 02/21/22 1713 02/21/22 1717   02/22/22 1200  cefTRIAXone (ROCEPHIN) 2 g in sodium chloride 0.9 % 100 mL IVPB  Status:  Discontinued        2 g 200 mL/hr over 30 Minutes Intravenous Every 24 hours 02/21/22 1713 02/24/22 1013   02/21/22 1815  cefTRIAXone (ROCEPHIN) 1 g in sodium chloride 0.9 % 100 mL IVPB        1 g 200 mL/hr over 30 Minutes Intravenous  Once 02/21/22 1717 02/22/22 0845   02/21/22 1215  cefTRIAXone (ROCEPHIN) 1 g in sodium chloride 0.9 % 100 mL IVPB        1 g 200 mL/hr over 30 Minutes Intravenous  Once 02/21/22 1202 02/21/22 1251   02/21/22 1215  azithromycin (ZITHROMAX) 500 mg in sodium chloride 0.9 % 250 mL IVPB        500 mg 250 mL/hr over 60 Minutes Intravenous  Once 02/21/22 1202 02/21/22 1449       Subjective: Still noted to be significantly dyspneic, requiring BiPAP, noted to be lethargic   Objective: Vitals:   02/27/22 1100 02/27/22 1139 02/27/22 1200 02/27/22 1459  BP: (!) 140/68 (!) 140/68 139/62 133/72  Pulse: 65 67 66 67  Resp: (!) 29 20 (!) 25 20  Temp:      TempSrc:      SpO2: 97% 95% 97% 97%  Weight:      Height:        Intake/Output Summary (Last 24 hours) at 02/27/2022 1538 Last data filed at 02/27/2022 0900 Gross per 24 hour  Intake 551.83 ml  Output 1100 ml  Net -548.17 ml   Filed Weights   02/23/22 1741 02/27/22 0542  Weight: 60 kg 60.7 kg    Examination:  General: Dyspneic, lethargic Cardiovascular: S1, S2 present Respiratory: Bilateral crackles noted Abdomen: Soft, nontender, nondistended, bowel sounds present Musculoskeletal: No bilateral pedal edema noted Skin:  Normal Psychiatry: Fair mood     Data Reviewed: I have personally reviewed following labs and imaging studies  CBC: Recent Labs  Lab 02/23/22 0247 02/24/22 0439 02/25/22 0310 02/26/22 0731 02/27/22 0307  WBC 19.5* 16.0* 10.8* 14.1* 13.5*  HGB 10.4* 10.7* 11.0* 10.9* 10.8*  HCT 32.2* 32.1* 33.8* 33.7* 32.7*  MCV 96.4 94.7 95.5 95.7 95.6  PLT 261 282 281 307 086   Basic Metabolic Panel: Recent Labs  Lab 02/22/22 0248 02/23/22 0247 02/24/22 0439 02/25/22 0310 02/26/22 0731 02/27/22 0307  NA 132* 132* 130* 130* 132* 133*  K 3.9 5.0 4.7 4.4 4.3 4.4  CL 98 103 101 99 99 100  CO2 '22 22 22 22 25 26  '$ GLUCOSE 203* 128* 92 133* 108* 142*  BUN 21 21 27* 31* 27* 31*  CREATININE 1.22* 0.82 0.93 1.05* 1.14* 1.05*  CALCIUM 8.3* 9.0 9.0 9.1 8.9 8.9  MG 1.9 2.3  --   --   --   --      Recent Results (from the past 240 hour(s))  Culture, blood (Routine x 2)     Status: None   Collection Time: 02/21/22 10:32 AM   Specimen: BLOOD  Result Value Ref Range Status   Specimen Description   Final    BLOOD RIGHT ANTECUBITAL Performed at [facility], [place] [address], [place], [place] [address]    Special Requests   Final    BOTTLES DRAWN AEROBIC AND ANAEROBIC Blood Culture adequate volume Performed at [facility], [address] [place], [place] [address]    Culture   Final    NO GROWTH 5 DAYS Performed at [facility], [place] [address], [place], [place] [address]    Report Status 02/26/2022 FINAL  Final  SARS Coronavirus 2 by RT PCR (hospital order, performed in [facility] hospital lab) *cepheid single result test* Anterior Nasal Swab     Status: None   Collection Time: 02/21/22 11:34 AM   Specimen: Anterior Nasal Swab  Result Value Ref Range Status   SARS Coronavirus 2 by RT PCR NEGATIVE NEGATIVE Final    Comment: (NOTE) SARS-CoV-2 target nucleic acids are NOT DETECTED.  The SARS-CoV-2 RNA is generally detectable in upper and  lower respiratory specimens during the acute phase of infection. The lowest concentration of SARS-CoV-2 viral copies this assay can detect is 250 copies / mL. A negative result does not preclude SARS-CoV-2 infection and should not be used as the sole basis for treatment or other patient management decisions.  A negative result may occur with improper specimen collection / handling, submission of specimen other than nasopharyngeal swab, presence of viral mutation(s) within the areas targeted by this assay, and inadequate number of viral copies (<250 copies / mL). A negative result must be combined with clinical observations, patient history, and epidemiological information.  Fact Sheet for Patients:   https://www.patel.info/  Fact Sheet for Healthcare Providers: https://hall.com/  This test is not yet approved or  cleared by the [place] FDA and has been authorized for detection and/or diagnosis of SARS-CoV-2 by FDA under an Emergency Use Authorization (EUA).  This EUA will remain in effect (meaning this test can be used) for the duration of the COVID-19 declaration under Section 564(b)(1) of the Act, 21 U.S.C. section 360bbb-3(b)(1), unless the authorization is terminated or revoked sooner.  Performed at [facility], [address] [place], [place] [address]   Culture, blood (Routine x 2)     Status: None   Collection Time: 02/21/22 11:34 AM   Specimen: BLOOD  Result Value Ref Range Status   Specimen Description   Final    BLOOD BLOOD LEFT HAND Performed at [facility], [address] [place], [place] [address]    Special Requests   Final    BOTTLES DRAWN AEROBIC AND ANAEROBIC Blood Culture adequate volume Performed at [facility], [address], [place], [place] [address]    Culture   Final    NO GROWTH 5 DAYS Performed at [facility], [place] [address], [place],  [address]    Report Status 02/26/2022 FINAL  Final  MRSA Next Gen by PCR, Nasal     Status: None   Collection Time: 02/24/22 12:58  PM   Specimen: Nasal Mucosa; Nasal Swab  Result Value Ref Range Status   MRSA by PCR Next Gen NOT DETECTED NOT DETECTED Final    Comment: (NOTE) The GeneXpert MRSA Assay (FDA approved for NASAL specimens only), is one component of a comprehensive MRSA colonization surveillance program. It is not intended to diagnose MRSA infection nor to guide or monitor treatment for MRSA infections. Test performance is not FDA approved in patients less than 37 years old. Performed at [facility], [place] [address] [place], [place] [address]   Respiratory (~20 pathogens) panel by PCR     Status: None   Collection Time: 02/25/22  2:39 PM   Specimen: Nasopharyngeal Swab; Respiratory  Result Value Ref Range Status   Adenovirus NOT DETECTED NOT DETECTED Final   Coronavirus 229E NOT DETECTED NOT DETECTED Final    Comment: (NOTE) The Coronavirus on the Respiratory Panel, DOES NOT test for the novel  Coronavirus (2019 nCoV)    Coronavirus HKU1 NOT DETECTED NOT DETECTED Final   Coronavirus NL63 NOT DETECTED NOT DETECTED Final   Coronavirus OC43 NOT DETECTED NOT DETECTED Final   Metapneumovirus NOT DETECTED NOT DETECTED Final   Rhinovirus / Enterovirus NOT DETECTED NOT DETECTED Final   Influenza A NOT DETECTED NOT DETECTED Final   Influenza B NOT DETECTED NOT DETECTED Final   Parainfluenza Virus 1 NOT DETECTED NOT DETECTED Final   Parainfluenza Virus 2 NOT DETECTED NOT DETECTED Final   Parainfluenza Virus 3 NOT DETECTED NOT DETECTED Final   Parainfluenza Virus 4 NOT DETECTED NOT DETECTED Final   Respiratory Syncytial Virus NOT DETECTED NOT DETECTED Final   Bordetella pertussis NOT DETECTED NOT DETECTED Final   Bordetella Parapertussis NOT DETECTED NOT DETECTED Final   Chlamydophila pneumoniae NOT DETECTED NOT DETECTED Final   Mycoplasma  pneumoniae NOT DETECTED NOT DETECTED Final    Comment: Performed at [facility], [place]. [address] [place], [place] [address]     Radiology Studies: CUP PACEART REMOTE DEVICE CHECK  Result Date: 02/26/2022 Scheduled remote reviewed. Normal device function.  Next remote 91 days. LA   Scheduled Meds:  amiodarone  200 mg Oral QPM   amoxicillin-clavulanate  1 tablet Oral Q12H   apixaban  5 mg Oral BID   arformoterol  15 mcg Nebulization BID   ascorbic acid  500 mg Oral Daily   budesonide (PULMICORT) nebulizer solution  0.5 mg Nebulization BID   Chlorhexidine Gluconate Cloth  6 each Topical Daily   cholecalciferol  4,000 Units Oral QPM   doxycycline  100 mg Oral Q12H   empagliflozin  10 mg Oral QAC breakfast   fluticasone  1 spray Each Nare Daily   folic acid  1 mg Oral QPM   gabapentin  300 mg Oral TID   metoprolol tartrate  25 mg Oral BID   [START ON 03/07/2022] predniSONE  10 mg Oral Q breakfast   [START ON 03/04/2022] predniSONE  20 mg Oral Q breakfast   [START ON 03/01/2022] predniSONE  30 mg Oral Q breakfast   predniSONE  40 mg Oral Q breakfast   revefenacin  175 mcg Nebulization Daily   vitamin E  400 Units Oral Daily   Continuous Infusions:  sodium chloride Stopped (02/26/22 1938)     LOS: 6 days   Alma Friendly, MD [facility] P11/11/2021, 3:38 PM

## 2022-02-27 NOTE — Progress Notes (Signed)
PT Cancellation Note  Patient Details Name: Sheila Morales MRN: [id number] DOB: [birth year]   Cancelled Treatment:    Reason Eval/Treat Not Completed: (P) Medical issues which prohibited therapy. Pt sleeping and per RN pt had to resume BiPap, requesting hold PT at this time. Will follow up as schedule and pt status allows which may be another day.   Coolidge Breeze, PT, DPT [facility] Office: [phone] Weekend pager: [phone]  Coolidge Breeze 02/27/2022, 10:47 AM

## 2022-02-27 NOTE — Progress Notes (Signed)
Pt seen and given scheduled nebulizer treatment.  Pt found on 2lnc, resting comfortably, HR72, RR22, SPO2 96%.  No increase wob / respiratory distress noted or voiced by patient.  Bipap remains in room on standby but not indicated at this time.

## 2022-02-28 DIAGNOSIS — J189 Pneumonia, unspecified organism: Secondary | ICD-10-CM | POA: Diagnosis not present

## 2022-02-28 DIAGNOSIS — I480 Paroxysmal atrial fibrillation: Secondary | ICD-10-CM | POA: Diagnosis not present

## 2022-02-28 DIAGNOSIS — J9601 Acute respiratory failure with hypoxia: Secondary | ICD-10-CM | POA: Diagnosis not present

## 2022-02-28 DIAGNOSIS — I5032 Chronic diastolic (congestive) heart failure: Secondary | ICD-10-CM | POA: Diagnosis not present

## 2022-02-28 LAB — CBC WITH DIFFERENTIAL/PLATELET
Abs Immature Granulocytes: 0.1 K/uL — ABNORMAL HIGH (ref 0.00–0.07)
Basophils Absolute: 0 K/uL (ref 0.0–0.1)
Basophils Relative: 0 %
Eosinophils Absolute: 0 K/uL (ref 0.0–0.5)
Eosinophils Relative: 0 %
HCT: 37.6 % (ref 36.0–46.0)
Hemoglobin: 12.3 g/dL (ref 12.0–15.0)
Immature Granulocytes: 1 %
Lymphocytes Relative: 5 %
Lymphs Abs: 0.6 K/uL — ABNORMAL LOW (ref 0.7–4.0)
MCH: 31.1 pg (ref 26.0–34.0)
MCHC: 32.7 g/dL (ref 30.0–36.0)
MCV: 94.9 fL (ref 80.0–100.0)
Monocytes Absolute: 0.9 K/uL (ref 0.1–1.0)
Monocytes Relative: 7 %
Neutro Abs: 10.8 K/uL — ABNORMAL HIGH (ref 1.7–7.7)
Neutrophils Relative %: 87 %
Platelets: 307 K/uL (ref 150–400)
RBC: 3.96 MIL/uL (ref 3.87–5.11)
RDW: 16.1 % — ABNORMAL HIGH (ref 11.5–15.5)
WBC: 12.4 K/uL — ABNORMAL HIGH (ref 4.0–10.5)
nRBC: 0 % (ref 0.0–0.2)

## 2022-02-28 LAB — BASIC METABOLIC PANEL WITH GFR
Anion gap: 10 (ref 5–15)
BUN: 28 mg/dL — ABNORMAL HIGH (ref 8–23)
CO2: 23 mmol/L (ref 22–32)
Calcium: 9.1 mg/dL (ref 8.9–10.3)
Chloride: 99 mmol/L (ref 98–111)
Creatinine, Ser: 0.89 mg/dL (ref 0.44–1.00)
GFR, Estimated: >60 mL/min
Glucose, Bld: 83 mg/dL (ref 70–99)
Potassium: 4.6 mmol/L (ref 3.5–5.1)
Sodium: 132 mmol/L — ABNORMAL LOW (ref 135–145)

## 2022-02-28 MED ORDER — ORAL CARE MOUTH RINSE
15.0000 mL | OROMUCOSAL | Status: DC
  Administered 2022-02-28 – 2022-03-01 (×3): 15 mL via OROMUCOSAL

## 2022-02-28 MED ORDER — ORAL CARE MOUTH RINSE: 15.0000 mL | OROMUCOSAL | Status: DC | PRN

## 2022-02-28 MED ORDER — FUROSEMIDE 10 MG/ML IJ SOLN
40.0000 mg | Freq: Every day | INTRAMUSCULAR | Status: AC
  Administered 2022-02-28 – 2022-03-01 (×2): 40 mg via INTRAVENOUS
  Filled 2022-02-28 (×2): qty 4

## 2022-02-28 NOTE — Progress Notes (Signed)
Found PT off BiPAP at approximately 1130. PT is resting at this time and does appear to be in distress at this time.

## 2022-02-28 NOTE — Progress Notes (Signed)
PROGRESS NOTE  Sheila Morales  GTX:[id number] DOB: 06/12/1950 DOA: 02/21/2022 PCP: Hoyt Koch, MD   Brief Narrative: Patient is  Sheila Morales is a 71 y.o. female with medical history significant of COPD, SLE on methotrexate, interstitial lung disease/eosinophilic pneumonia, chronic diastolic CHF, paroxysmal A-fib, sick sinus syndrome status post pacemaker placement, chronic hyponatremia who presented with complaint of fever, weakness, dyspnea, cough. Patient uses chronically 2 L of oxygen as needed at home for her COPD/ILD. Chest x-ray showed right upper lobe infiltrate. Patient admitted for further management. Hospital course remarkable for persistent dyspnea, respiratory distress requiring BiPAP, had to be transferred to ICU on 11/5.      Assessment & Plan:  Principal Problem:   CAP (community acquired pneumonia) Active Problems:   Systemic lupus erythematosus (HCC)   COPD (chronic obstructive pulmonary disease) (HCC)   Hyponatremia   Acute respiratory failure with hypoxia (HCC)   Chronic diastolic CHF (congestive heart failure) (HCC)   AF (paroxysmal atrial fibrillation) ([place])   Sepsis likely 2/2 community acquired pneumonia Acute on chronic hypoxic respiratory failure (on 2L of O2 prn at home) Secondary to pneumonia and COPD exacerbation Chest x-ray showed possible right upper lobe pneumonia with repeat showing similar with mild improvement D-dimer mildly elevated CTA chest negative for PE, although motion artifact noted, small right pleural effusion Negative Streptococcus pneumoniae antigen,  Legionella antigen Blood culture negative so far Respiratory viral panel negative Antibiotics changed to Augmentin Still requiring as needed BiPAP as well as supplemental O2, continue Monitor closely in SDU   History of COPD/ILD Follows with pulmonology for eosinophilic pneumonia CTA chest showed overall progressive of interstitial lung disease Continue bronchodilators as needed, home  inhalers Continue slow prednisone taper Follow-up with pulmonology as an outpatient   SLE On methotrexate   History of paroxysmal A-fib Currently in normal sinus rhythm Status post pacemaker for sick sinus syndrome On Eliquis for anticoagulation. Takes metoprolol for rate control   Hx of Chronic diastolic CHF BNP 2,482, ??acute on chronic Echo showed EF of 65 to 70%, no regional wall motion abnormality, left ventricular diastolic parameters are indeterminate Start IV lasix Home lasix has been on hold, takes 20 mg of Lasix at home  Chronic hyponatremia Noted chronic hyponatremia    Peripheral neuropathy On gabapentin   Weakness/deconditioning PT/OT     DVT prophylaxis: apixaban (ELIQUIS) tablet 5 mg     Code Status: Full Code  Family Communication: Husband at bedside daily  Patient status:Inpatient  Patient is from :Home  Anticipated discharge to: TBD  Estimated DC date: TBD   Consultants: PCCM  Procedures:None  Antimicrobials:  Anti-infectives (From admission, onward)    Start     Dose/Rate Route Frequency Ordered Stop   02/26/22 1400  levofloxacin (LEVAQUIN) tablet 750 mg  Status:  Discontinued        750 mg Oral Every 48 hours 02/26/22 0807 02/26/22 1101   02/26/22 1200  amoxicillin-clavulanate (AUGMENTIN) 875-125 MG per tablet 1 tablet        1 tablet Oral Every 12 hours 02/26/22 1101 03/02/22 2359   02/26/22 1200  doxycycline (VIBRA-TABS) tablet 100 mg        100 mg Oral Every 12 hours 02/26/22 1101 03/02/22 2359   02/26/22 1000  levofloxacin (LEVAQUIN) tablet 750 mg  Status:  Discontinued        750 mg Oral Daily 02/26/22 0726 02/26/22 0807   02/25/22 1200  vancomycin (VANCOREADY) IVPB 750 mg/150 mL  Status:  Discontinued  750 mg 150 mL/hr over 60 Minutes Intravenous Every 24 hours 02/24/22 1032 02/25/22 1033   02/24/22 1130  piperacillin-tazobactam (ZOSYN) IVPB 3.375 g  Status:  Discontinued        3.375 g 12.5 mL/hr over 240 Minutes  Intravenous Every 8 hours 02/24/22 1032 02/26/22 0726   02/24/22 1115  vancomycin (VANCOREADY) IVPB 1500 mg/300 mL        1,500 mg 150 mL/hr over 120 Minutes Intravenous  Once 02/24/22 1022 02/24/22 1234   02/22/22 1400  azithromycin (ZITHROMAX) 500 mg in sodium chloride 0.9 % 250 mL IVPB  Status:  Discontinued        500 mg 250 mL/hr over 60 Minutes Intravenous Every 24 hours 02/21/22 1713 02/24/22 1013   02/22/22 1200  cefTRIAXone (ROCEPHIN) 1 g in sodium chloride 0.9 % 100 mL IVPB  Status:  Discontinued        1 g 200 mL/hr over 30 Minutes Intravenous  Once 02/21/22 1713 02/21/22 1717   02/22/22 1200  cefTRIAXone (ROCEPHIN) 2 g in sodium chloride 0.9 % 100 mL IVPB  Status:  Discontinued        2 g 200 mL/hr over 30 Minutes Intravenous Every 24 hours 02/21/22 1713 02/24/22 1013   02/21/22 1815  cefTRIAXone (ROCEPHIN) 1 g in sodium chloride 0.9 % 100 mL IVPB        1 g 200 mL/hr over 30 Minutes Intravenous  Once 02/21/22 1717 02/22/22 0845   02/21/22 1215  cefTRIAXone (ROCEPHIN) 1 g in sodium chloride 0.9 % 100 mL IVPB        1 g 200 mL/hr over 30 Minutes Intravenous  Once 02/21/22 1202 02/21/22 1251   02/21/22 1215  azithromycin (ZITHROMAX) 500 mg in sodium chloride 0.9 % 250 mL IVPB        500 mg 250 mL/hr over 60 Minutes Intravenous  Once 02/21/22 1202 02/21/22 1449       Subjective: Still noted to still be dyspneic, denies any chest pain   Objective: Vitals:   02/28/22 1200 02/28/22 1300 02/28/22 1400 02/28/22 1534  BP: 138/67 (!) 144/108 (!) 155/69 (!) 148/71  Pulse: 66 70 72 68  Resp: (!) 22 (!) '23 19 20  '$ Temp: 98.1 F (36.7 C)     TempSrc: Oral     SpO2: 96% 99% 100% 100%  Weight:      Height:        Intake/Output Summary (Last 24 hours) at 02/28/2022 1545 Last data filed at 02/28/2022 1300 Gross per 24 hour  Intake 360 ml  Output 1800 ml  Net -1440 ml   Filed Weights   02/23/22 1741 02/27/22 0542  Weight: 60 kg 60.7 kg    Examination:  General:  Dyspneic, lethargic Cardiovascular: S1, S2 present Respiratory: Bilateral crackles noted Abdomen: Soft, nontender, nondistended, bowel sounds present Musculoskeletal: No bilateral pedal edema noted Skin: Normal Psychiatry: Fair mood     Data Reviewed: I have personally reviewed following labs and imaging studies  CBC: Recent Labs  Lab 02/24/22 0439 02/25/22 0310 02/26/22 0731 02/27/22 0307 02/28/22 0329  WBC 16.0* 10.8* 14.1* 13.5* 12.4*  NEUTROABS  --   --   --   --  10.8*  HGB 10.7* 11.0* 10.9* 10.8* 12.3  HCT 32.1* 33.8* 33.7* 32.7* 37.6  MCV 94.7 95.5 95.7 95.6 94.9  PLT 282 281 307 306 702   Basic Metabolic Panel: Recent Labs  Lab 02/22/22 0248 02/23/22 0247 02/24/22 0439 02/25/22 0310 02/26/22 0731 02/27/22 0307 02/28/22  0329  NA 132* 132* 130* 130* 132* 133* 132*  K 3.9 5.0 4.7 4.4 4.3 4.4 4.6  CL 98 103 101 99 99 100 99  CO2 '22 22 22 22 25 26 23  '$ GLUCOSE 203* 128* 92 133* 108* 142* 83  BUN 21 21 27* 31* 27* 31* 28*  CREATININE 1.22* 0.82 0.93 1.05* 1.14* 1.05* 0.89  CALCIUM 8.3* 9.0 9.0 9.1 8.9 8.9 9.1  MG 1.9 2.3  --   --   --   --   --      Recent Results (from the past 240 hour(s))  Culture, blood (Routine x 2)     Status: None   Collection Time: 02/21/22 10:32 AM   Specimen: BLOOD  Result Value Ref Range Status   Specimen Description   Final    BLOOD RIGHT ANTECUBITAL Performed at [facility], [place] [address], [place], [place] [address]    Special Requests   Final    BOTTLES DRAWN AEROBIC AND ANAEROBIC Blood Culture adequate volume Performed at [facility], [address] [place], [place] [address]    Culture   Final    NO GROWTH 5 DAYS Performed at [facility], [place] [address] [place], [place] [address]    Report Status 02/26/2022 FINAL  Final  SARS Coronavirus 2 by RT PCR (hospital order, performed in [facility] hospital lab) *cepheid single result test* Anterior Nasal Swab     Status: None    Collection Time: 02/21/22 11:34 AM   Specimen: Anterior Nasal Swab  Result Value Ref Range Status   SARS Coronavirus 2 by RT PCR NEGATIVE NEGATIVE Final    Comment: (NOTE) SARS-CoV-2 target nucleic acids are NOT DETECTED.  The SARS-CoV-2 RNA is generally detectable in upper and lower respiratory specimens during the acute phase of infection. The lowest concentration of SARS-CoV-2 viral copies this assay can detect is 250 copies / mL. A negative result does not preclude SARS-CoV-2 infection and should not be used as the sole basis for treatment or other patient management decisions.  A negative result may occur with improper specimen collection / handling, submission of specimen other than nasopharyngeal swab, presence of viral mutation(s) within the areas targeted by this assay, and inadequate number of viral copies (<250 copies / mL). A negative result must be combined with clinical observations, patient history, and epidemiological information.  Fact Sheet for Patients:   https://www.patel.info/  Fact Sheet for Healthcare Providers: https://hall.com/  This test is not yet approved or  cleared by the [place] FDA and has been authorized for detection and/or diagnosis of SARS-CoV-2 by FDA under an Emergency Use Authorization (EUA).  This EUA will remain in effect (meaning this test can be used) for the duration of the COVID-19 declaration under Section 564(b)(1) of the Act, 21 U.S.C. section 360bbb-3(b)(1), unless the authorization is terminated or revoked sooner.  Performed at [facility], [address], [place], [place] [address]   Culture, blood (Routine x 2)     Status: None   Collection Time: 02/21/22 11:34 AM   Specimen: BLOOD  Result Value Ref Range Status   Specimen Description   Final    BLOOD BLOOD LEFT HAND Performed at [facility], [address] [place], [place]  [address]    Special Requests   Final    BOTTLES DRAWN AEROBIC AND ANAEROBIC Blood Culture adequate volume Performed at [facility], [address], [place],  [address]  Culture   Final    NO GROWTH 5 DAYS Performed at [facility], [place] [address], [place], [place] [address]    Report Status 02/26/2022 FINAL  Final  MRSA Next Gen by PCR, Nasal     Status: None   Collection Time: 02/24/22 12:58 PM   Specimen: Nasal Mucosa; Nasal Swab  Result Value Ref Range Status   MRSA by PCR Next Gen NOT DETECTED NOT DETECTED Final    Comment: (NOTE) The GeneXpert MRSA Assay (FDA approved for NASAL specimens only), is one component of a comprehensive MRSA colonization surveillance program. It is not intended to diagnose MRSA infection nor to guide or monitor treatment for MRSA infections. Test performance is not FDA approved in patients less than 64 years old. Performed at [facility], [place] [address], [place], [place] [address]   Respiratory (~20 pathogens) panel by PCR     Status: None   Collection Time: 02/25/22  2:39 PM   Specimen: Nasopharyngeal Swab; Respiratory  Result Value Ref Range Status   Adenovirus NOT DETECTED NOT DETECTED Final   Coronavirus 229E NOT DETECTED NOT DETECTED Final    Comment: (NOTE) The Coronavirus on the Respiratory Panel, DOES NOT test for the novel  Coronavirus (2019 nCoV)    Coronavirus HKU1 NOT DETECTED NOT DETECTED Final   Coronavirus NL63 NOT DETECTED NOT DETECTED Final   Coronavirus OC43 NOT DETECTED NOT DETECTED Final   Metapneumovirus NOT DETECTED NOT DETECTED Final   Rhinovirus / Enterovirus NOT DETECTED NOT DETECTED Final   Influenza A NOT DETECTED NOT DETECTED Final   Influenza B NOT DETECTED NOT DETECTED Final   Parainfluenza Virus 1 NOT DETECTED NOT DETECTED Final   Parainfluenza Virus 2 NOT DETECTED NOT DETECTED Final   Parainfluenza Virus 3 NOT DETECTED NOT DETECTED Final   Parainfluenza  Virus 4 NOT DETECTED NOT DETECTED Final   Respiratory Syncytial Virus NOT DETECTED NOT DETECTED Final   Bordetella pertussis NOT DETECTED NOT DETECTED Final   Bordetella Parapertussis NOT DETECTED NOT DETECTED Final   Chlamydophila pneumoniae NOT DETECTED NOT DETECTED Final   Mycoplasma pneumoniae NOT DETECTED NOT DETECTED Final    Comment: Performed at [facility], [place]. [address], [place], [place] [address]     Radiology Studies: CT Angio Chest Pulmonary Embolism (PE) W or WO Contrast  Result Date: 02/27/2022 CLINICAL DATA:  Pulmonary embolism (PE) suspected, positive D-dimer EXAM: CT ANGIOGRAPHY CHEST WITH CONTRAST TECHNIQUE: Multidetector CT imaging of the chest was performed using the standard protocol during bolus administration of intravenous contrast. Multiplanar CT image reconstructions and MIPs were obtained to evaluate the vascular anatomy. RADIATION DOSE REDUCTION: This exam was performed according to the departmental dose-optimization program which includes automated exposure control, adjustment of the mA and/or kV according to patient size and/or use of iterative reconstruction technique. CONTRAST:  22m OMNIPAQUE IOHEXOL 350 MG/ML SOLN COMPARISON:  Radiograph earlier today. Chest CT 04/24/2021, portions from PET CT 08/28/2021 FINDINGS: Cardiovascular: Allowing for motion artifact, there is no pulmonary embolus. Motion obscures detailed assessment of the lung bases particularly lower lobes. Mild dilatation of the main pulmonary artery at 3.4 cm. Left-sided pacemaker with intact leads. Mild multi chamber cardiomegaly. There is contrast refluxing into the hepatic veins and IVC. No pericardial effusion. No thoracic aortic aneurysm or dissection, moderate atherosclerosis. There are coronary artery calcifications. Mediastinum/Nodes: 13 mm subcarinal node series 4, image 48. This previously measured 10 mm. The previous prominent mediastinal lymph nodes have likely improved, but are  partially obscured by dense artifact from IV contrast in the SVC. There is high-density material within the distal esophagus and proximal stomach suggesting ingested material, possible reflux or delayed transit. Lungs/Pleura: Ground-glass opacities within both lungs, most prominent involving the right upper lobe, but multifocal involvement. There is progression from prior chest CT. Superimposed septal thickening primarily in the upper lobes. Small right pleural effusion. There is a small amount of fluid in the left inter lobar fissure. There is mild emphysema. No dominant pulmonary mass. Upper Abdomen: Contrast refluxing into the hepatic veins and IVC. Small amount of high-density ingested material in the stomach. Musculoskeletal: There are no acute or suspicious osseous abnormalities. Review of the MIP images confirms the above findings. IMPRESSION: 1. Allowing for motion artifact, no pulmonary embolus. 2. Ground-glass opacities within both lungs, most prominent involving the right upper lobe. Opacities have been present on prior exams, but progressed currently. Waxing and waning atypical infection, interstitial lung disease or less likely asymmetric pulmonary edema are considered. 3. Small right pleural effusion. Mild multi chamber cardiomegaly. Contrast refluxing into the hepatic veins and IVC consistent with elevated right heart pressures. 4. Mild dilatation of the main pulmonary artery suggesting pulmonary arterial hypertension. 5. High-density material within the distal esophagus and proximal stomach suggesting ingested material, possible reflux or delayed transit. Aortic Atherosclerosis (ICD10-I70.0) and Emphysema (ICD10-J43.9). Electronically Signed   By: Keith Rake M.D.   On: 02/27/2022 19:00    Scheduled Meds:  amiodarone  200 mg Oral QPM   amoxicillin-clavulanate  1 tablet Oral Q12H   apixaban  5 mg Oral BID   arformoterol  15 mcg Nebulization BID   ascorbic acid  500 mg Oral Daily    budesonide (PULMICORT) nebulizer solution  0.5 mg Nebulization BID   Chlorhexidine Gluconate Cloth  6 each Topical Daily   cholecalciferol  4,000 Units Oral QPM   doxycycline  100 mg Oral Q12H   empagliflozin  10 mg Oral QAC breakfast   fluticasone  1 spray Each Nare Daily   folic acid  1 mg Oral QPM   furosemide  40 mg Intravenous Daily   gabapentin  300 mg Oral TID   metoprolol tartrate  25 mg Oral BID   [START ON 03/07/2022] predniSONE  10 mg Oral Q breakfast   [START ON 03/04/2022] predniSONE  20 mg Oral Q breakfast   [START ON 03/01/2022] predniSONE  30 mg Oral Q breakfast   revefenacin  175 mcg Nebulization Daily   vitamin E  400 Units Oral Daily   Continuous Infusions:  sodium chloride Stopped (02/26/22 1938)     LOS: 7 days   Alma Friendly, MD [facility] P11/12/2021, 3:45 PM

## 2022-02-28 NOTE — Progress Notes (Signed)
Per RN, PT given Lasix and placed back on BiPAP at approximately 1445.

## 2022-02-28 NOTE — Progress Notes (Signed)
BP 180/104, pt asymptomatic. PRN Hydralazine '10mg'$  IV given. Recheck BP 143/71. Will continue to monitor BP.

## 2022-02-28 NOTE — Progress Notes (Signed)
Physical Therapy Treatment Patient Details Name: Sheila Morales MRN: [id number] DOB: 10/15/1950 Today's Date: 02/28/2022   History of Present Illness Pt admitted from home with acute hypoxemis respiratory failure 2* CAP.  PT with hx of CHF, COPD, A-fib, SSS s/p pacemaker, SLE, peripheral neuropathy, and chronic hyponatremia    PT Comments    Husband present during session. Denied any pain. Patient unable to participate in out of bed activity due to BIPAP but able to complete exercises. Alert and oriented w/ no complaints. Rest  HR:75     O2:100%      RR:22     BP:148/71(100); BIPAP   rate:26        PIP:13       O2:30%  Recommendations for follow up therapy are one component of a multi-disciplinary discharge planning process, led by the attending physician.  Recommendations may be updated based on patient status, additional functional criteria and insurance authorization.  Follow Up Recommendations  Outpatient PT     Assistance Recommended at Discharge Intermittent Supervision/Assistance  Patient can return home with the following     Equipment Recommendations  None recommended by PT    Recommendations for Other Services       Precautions / Restrictions Precautions Precautions: Fall Precaution Comments: watch SpO2 Restrictions Weight Bearing Restrictions: No     Mobility  Bed Mobility                    Transfers                        Ambulation/Gait                   Stairs             Wheelchair Mobility    Modified Rankin (Stroke Patients Only)       Balance                                            Cognition Arousal/Alertness: Awake/alert Behavior During Therapy: WFL for tasks assessed/performed, Impulsive Overall Cognitive Status: Within Functional Limits for tasks assessed                                          Exercises Total Joint Exercises Ankle Circles/Pumps: AROM, 20 reps,  Both, Supine Quad Sets: AROM, Both, 10 reps, Supine Towel Squeeze: AROM, Supine, 10 reps, Both Short Arc Quad: AROM, 10 reps, Both Heel Slides: AROM, Both, 5 reps Hip ABduction/ADduction: AROM, 10 reps, Both    General Comments        Pertinent Vitals/Pain Pain Assessment Pain Assessment: No/denies pain    Home Living                          Prior Function            PT Goals (current goals can now be found in the care plan section) Acute Rehab PT Goals Patient Stated Goal: HOME PT Goal Formulation: With patient Time For Goal Achievement: 03/09/22 Potential to Achieve Goals: Good Progress towards PT goals: Progressing toward goals    Frequency    Min 3X/week      PT Plan Current plan remains appropriate  Co-evaluation              AM-PAC PT "6 Clicks" Mobility   Outcome Measure  Help needed turning from your back to your side while in a flat bed without using bedrails?: None Help needed moving from lying on your back to sitting on the side of a flat bed without using bedrails?: None Help needed moving to and from a bed to a chair (including a wheelchair)?: A Little Help needed standing up from a chair using your arms (e.g., wheelchair or bedside chair)?: A Little Help needed to walk in hospital room?: A Little Help needed climbing 3-5 steps with a railing? : A Little 6 Click Score: 20    End of Session Equipment Utilized During Treatment: Gait belt Activity Tolerance: Patient tolerated treatment well;Patient limited by fatigue Patient left: with call bell/phone within reach;with family/visitor present;in bed;with bed alarm set Nurse Communication: Mobility status PT Visit Diagnosis: Unsteadiness on feet (R26.81);Difficulty in walking, not elsewhere classified (R26.2);Muscle weakness (generalized) (M62.81)     Time: 1440-1500 PT Time Calculation (min) (ACUTE ONLY): 20 min  Charges:  $Therapeutic Exercise: 8-22 mins                         Jillien Yakel 02/28/2022, 4:11 PM

## 2022-03-01 DIAGNOSIS — J189 Pneumonia, unspecified organism: Secondary | ICD-10-CM | POA: Diagnosis not present

## 2022-03-01 DIAGNOSIS — I480 Paroxysmal atrial fibrillation: Secondary | ICD-10-CM | POA: Diagnosis not present

## 2022-03-01 DIAGNOSIS — I5032 Chronic diastolic (congestive) heart failure: Secondary | ICD-10-CM | POA: Diagnosis not present

## 2022-03-01 DIAGNOSIS — J9601 Acute respiratory failure with hypoxia: Secondary | ICD-10-CM | POA: Diagnosis not present

## 2022-03-01 LAB — BASIC METABOLIC PANEL
Anion gap: 9 (ref 5–15)
BUN: 30 mg/dL — ABNORMAL HIGH (ref 8–23)
CO2: 25 mmol/L (ref 22–32)
Calcium: 9 mg/dL (ref 8.9–10.3)
Chloride: 97 mmol/L — ABNORMAL LOW (ref 98–111)
Creatinine, Ser: 0.78 mg/dL (ref 0.44–1.00)
GFR, Estimated: >60 mL/min (ref >60)
Glucose, Bld: 85 mg/dL (ref 70–99)
Potassium: 4 mmol/L (ref 3.5–5.1)
Sodium: 131 mmol/L — ABNORMAL LOW (ref 135–145)

## 2022-03-01 LAB — CBC WITH DIFFERENTIAL/PLATELET
Abs Immature Granulocytes: 0.15 10*3/uL — ABNORMAL HIGH (ref 0.00–0.07)
Basophils Absolute: 0 10*3/uL (ref 0.0–0.1)
Basophils Relative: 0 %
Eosinophils Absolute: 0 10*3/uL (ref 0.0–0.5)
Eosinophils Relative: 0 %
HCT: 37.2 % (ref 36.0–46.0)
Hemoglobin: 12.1 g/dL (ref 12.0–15.0)
Immature Granulocytes: 1 %
Lymphocytes Relative: 6 %
Lymphs Abs: 0.6 10*3/uL — ABNORMAL LOW (ref 0.7–4.0)
MCH: 30.8 pg (ref 26.0–34.0)
MCHC: 32.5 g/dL (ref 30.0–36.0)
MCV: 94.7 fL (ref 80.0–100.0)
Monocytes Absolute: 1.1 10*3/uL — ABNORMAL HIGH (ref 0.1–1.0)
Monocytes Relative: 11 %
Neutro Abs: 8.7 10*3/uL — ABNORMAL HIGH (ref 1.7–7.7)
Neutrophils Relative %: 82 %
Platelets: 329 10*3/uL (ref 150–400)
RBC: 3.93 MIL/uL (ref 3.87–5.11)
RDW: 16.1 % — ABNORMAL HIGH (ref 11.5–15.5)
WBC: 10.6 10*3/uL — ABNORMAL HIGH (ref 4.0–10.5)
nRBC: 0 % (ref 0.0–0.2)

## 2022-03-01 NOTE — Progress Notes (Signed)
PROGRESS NOTE  Sheila Morales  GTX:[id number] DOB: 06/12/1950 DOA: 02/21/2022 PCP: Hoyt Koch, MD   Brief Narrative: Patient is  Sheila Morales is a 71 y.o. female with medical history significant of COPD, SLE on methotrexate, interstitial lung disease/eosinophilic pneumonia, chronic diastolic CHF, paroxysmal A-fib, sick sinus syndrome status post pacemaker placement, chronic hyponatremia who presented with complaint of fever, weakness, dyspnea, cough. Patient uses chronically 2 L of oxygen as needed at home for her COPD/ILD. Chest x-ray showed right upper lobe infiltrate. Patient admitted for further management. Hospital course remarkable for persistent dyspnea, respiratory distress requiring BiPAP, had to be transferred to ICU on 11/5.      Assessment & Plan:  Principal Problem:   CAP (community acquired pneumonia) Active Problems:   Systemic lupus erythematosus (HCC)   COPD (chronic obstructive pulmonary disease) (HCC)   Hyponatremia   Acute respiratory failure with hypoxia (HCC)   Chronic diastolic CHF (congestive heart failure) (HCC)   AF (paroxysmal atrial fibrillation) ([place])   Sepsis likely 2/2 community acquired pneumonia Acute on chronic hypoxic respiratory failure (on 2L of O2 prn at home) Secondary to pneumonia and COPD exacerbation Chest x-ray showed possible right upper lobe pneumonia with repeat showing similar with mild improvement D-dimer mildly elevated CTA chest negative for PE, although motion artifact noted, small right pleural effusion Negative Streptococcus pneumoniae antigen,  Legionella antigen Blood culture negative so far Respiratory viral panel negative Antibiotics changed to Augmentin Still requiring as needed BiPAP as well as supplemental O2, continue Monitor closely in SDU   History of COPD/ILD Follows with pulmonology for eosinophilic pneumonia CTA chest showed overall progressive of interstitial lung disease Continue bronchodilators as needed, home  inhalers Continue slow prednisone taper Follow-up with pulmonology as an outpatient   SLE On methotrexate   History of paroxysmal A-fib Currently in normal sinus rhythm Status post pacemaker for sick sinus syndrome On Eliquis for anticoagulation. Takes metoprolol for rate control   Hx of Chronic diastolic CHF BNP 2,482, ??acute on chronic Echo showed EF of 65 to 70%, no regional wall motion abnormality, left ventricular diastolic parameters are indeterminate Start IV lasix Home lasix has been on hold, takes 20 mg of Lasix at home  Chronic hyponatremia Noted chronic hyponatremia    Peripheral neuropathy On gabapentin   Weakness/deconditioning PT/OT     DVT prophylaxis: apixaban (ELIQUIS) tablet 5 mg     Code Status: Full Code  Family Communication: Husband at bedside daily  Patient status:Inpatient  Patient is from :Home  Anticipated discharge to: TBD  Estimated DC date: TBD   Consultants: PCCM  Procedures:None  Antimicrobials:  Anti-infectives (From admission, onward)    Start     Dose/Rate Route Frequency Ordered Stop   02/26/22 1400  levofloxacin (LEVAQUIN) tablet 750 mg  Status:  Discontinued        750 mg Oral Every 48 hours 02/26/22 0807 02/26/22 1101   02/26/22 1200  amoxicillin-clavulanate (AUGMENTIN) 875-125 MG per tablet 1 tablet        1 tablet Oral Every 12 hours 02/26/22 1101 03/02/22 2359   02/26/22 1200  doxycycline (VIBRA-TABS) tablet 100 mg        100 mg Oral Every 12 hours 02/26/22 1101 03/02/22 2359   02/26/22 1000  levofloxacin (LEVAQUIN) tablet 750 mg  Status:  Discontinued        750 mg Oral Daily 02/26/22 0726 02/26/22 0807   02/25/22 1200  vancomycin (VANCOREADY) IVPB 750 mg/150 mL  Status:  Discontinued  750 mg 150 mL/hr over 60 Minutes Intravenous Every 24 hours 02/24/22 1032 02/25/22 1033   02/24/22 1130  piperacillin-tazobactam (ZOSYN) IVPB 3.375 g  Status:  Discontinued        3.375 g 12.5 mL/hr over 240 Minutes  Intravenous Every 8 hours 02/24/22 1032 02/26/22 0726   02/24/22 1115  vancomycin (VANCOREADY) IVPB 1500 mg/300 mL        1,500 mg 150 mL/hr over 120 Minutes Intravenous  Once 02/24/22 1022 02/24/22 1234   02/22/22 1400  azithromycin (ZITHROMAX) 500 mg in sodium chloride 0.9 % 250 mL IVPB  Status:  Discontinued        500 mg 250 mL/hr over 60 Minutes Intravenous Every 24 hours 02/21/22 1713 02/24/22 1013   02/22/22 1200  cefTRIAXone (ROCEPHIN) 1 g in sodium chloride 0.9 % 100 mL IVPB  Status:  Discontinued        1 g 200 mL/hr over 30 Minutes Intravenous  Once 02/21/22 1713 02/21/22 1717   02/22/22 1200  cefTRIAXone (ROCEPHIN) 2 g in sodium chloride 0.9 % 100 mL IVPB  Status:  Discontinued        2 g 200 mL/hr over 30 Minutes Intravenous Every 24 hours 02/21/22 1713 02/24/22 1013   02/21/22 1815  cefTRIAXone (ROCEPHIN) 1 g in sodium chloride 0.9 % 100 mL IVPB        1 g 200 mL/hr over 30 Minutes Intravenous  Once 02/21/22 1717 02/22/22 0845   02/21/22 1215  cefTRIAXone (ROCEPHIN) 1 g in sodium chloride 0.9 % 100 mL IVPB        1 g 200 mL/hr over 30 Minutes Intravenous  Once 02/21/22 1202 02/21/22 1251   02/21/22 1215  azithromycin (ZITHROMAX) 500 mg in sodium chloride 0.9 % 250 mL IVPB        500 mg 250 mL/hr over 60 Minutes Intravenous  Once 02/21/22 1202 02/21/22 1449       Subjective: Pt reports feeling better. Met pt sitting on chair on RA saturating well   Objective: Vitals:   03/01/22 1600 03/01/22 1613 03/01/22 1700 03/01/22 1800  BP: (!) 109/59 (!) 109/57 115/62 (!) 112/52  Pulse: 66 76 72 69  Resp: (!) 26 20 (!) 26 18  Temp: 97.6 F (36.4 C)     TempSrc: Axillary     SpO2: 93% 98% 95% 92%  Weight:      Height:        Intake/Output Summary (Last 24 hours) at 03/01/2022 1823 Last data filed at 03/01/2022 1700 Gross per 24 hour  Intake 960 ml  Output 1650 ml  Net -690 ml   Filed Weights   02/23/22 1741 02/27/22 0542  Weight: 60 kg 60.7 kg     Examination:  General: Chronically ill appearing Cardiovascular: S1, S2 present Respiratory: Bilateral crackles noted Abdomen: Soft, nontender, nondistended, bowel sounds present Musculoskeletal: No bilateral pedal edema noted Skin: Normal Psychiatry: Fair mood     Data Reviewed: I have personally reviewed following labs and imaging studies  CBC: Recent Labs  Lab 02/25/22 0310 02/26/22 0731 02/27/22 0307 02/28/22 0329 03/01/22 0247  WBC 10.8* 14.1* 13.5* 12.4* 10.6*  NEUTROABS  --   --   --  10.8* 8.7*  HGB 11.0* 10.9* 10.8* 12.3 12.1  HCT 33.8* 33.7* 32.7* 37.6 37.2  MCV 95.5 95.7 95.6 94.9 94.7  PLT 281 307 306 307 532   Basic Metabolic Panel: Recent Labs  Lab 02/23/22 0247 02/24/22 0439 02/25/22 0310 02/26/22 0731 02/27/22 0307 02/28/22  0329 03/01/22 0247  NA 132*   < > 130* 132* 133* 132* 131*  K 5.0   < > 4.4 4.3 4.4 4.6 4.0  CL 103   < > 99 99 100 99 97*  CO2 22   < > _0 GLUCOSE 128*   < > 133* 108* 142* 83 85  BUN 21   < > 31* 27* 31* 28* 30*  CREATININE 0.82   < > 1.05* 1.14* 1.05* 0.89 0.78  CALCIUM 9.0   < > 9.1 8.9 8.9 9.1 9.0  MG 2.3  --   --   --   --   --   --    < > = values in this interval not displayed.     Recent Results (from the past 240 hour(s))  Culture, blood (Routine x 2)     Status: None   Collection Time: 02/21/22 10:32 AM   Specimen: BLOOD  Result Value Ref Range Status   Specimen Description   Final    BLOOD RIGHT ANTECUBITAL Performed at [facility], [place] [address] [place], [place] [address]    Special Requests   Final    BOTTLES DRAWN AEROBIC AND ANAEROBIC Blood Culture adequate volume Performed at [facility], [address] [place], [place] [address]    Culture   Final    NO GROWTH 5 DAYS Performed at [facility], [place] [address], [place], [place] [address]    Report Status 02/26/2022 FINAL  Final  SARS Coronavirus 2 by RT PCR (hospital order, performed in [facility] hospital lab) *cepheid single result test* Anterior Nasal Swab     Status: None   Collection Time: 02/21/22 11:34 AM   Specimen: Anterior Nasal Swab  Result Value Ref Range Status   SARS Coronavirus 2 by RT PCR NEGATIVE NEGATIVE Final    Comment: (NOTE) SARS-CoV-2 target nucleic acids are NOT DETECTED.  The SARS-CoV-2 RNA is generally detectable in upper and lower respiratory specimens during the acute phase of infection. The lowest concentration of SARS-CoV-2 viral copies this assay can detect is 250 copies / mL. A negative result does not preclude SARS-CoV-2 infection and should not be used as the sole basis for treatment or other patient management decisions.  A negative result may occur with improper specimen collection / handling, submission of specimen other than nasopharyngeal swab, presence of viral mutation(s) within the areas targeted by this assay, and inadequate number of viral copies (<250 copies / mL). A negative result must be combined with clinical observations, patient history, and epidemiological information.  Fact Sheet for Patients:   https://www.patel.info/  Fact Sheet for Healthcare Providers: https://hall.com/  This test is not yet approved or  cleared by the [place] FDA and has been authorized for detection and/or diagnosis of SARS-CoV-2 by FDA under an Emergency Use Authorization (EUA).  This EUA will remain in effect (meaning this test can be used) for the duration of the COVID-19 declaration under Section 564(b)(1) of the Act, 21 U.S.C. section 360bbb-3(b)(1), unless the authorization is terminated or revoked sooner.  Performed at [facility], [address] [place], [place] [address]   Culture, blood (Routine x 2)     Status: None   Collection Time: 02/21/22 11:34 AM   Specimen: BLOOD  Result Value Ref Range Status   Specimen Description   Final    BLOOD BLOOD LEFT  HAND Performed at [facility], [phone]  [address], [place], [place] [address]    Special Requests   Final    BOTTLES DRAWN AEROBIC AND ANAEROBIC Blood Culture adequate volume Performed at [facility], [address] [place], [place] [address]    Culture   Final    NO GROWTH 5 DAYS Performed at [facility], [place] [address] [place], [place] [address]    Report Status 02/26/2022 FINAL  Final  MRSA Next Gen by PCR, Nasal     Status: None   Collection Time: 02/24/22 12:58 PM   Specimen: Nasal Mucosa; Nasal Swab  Result Value Ref Range Status   MRSA by PCR Next Gen NOT DETECTED NOT DETECTED Final    Comment: (NOTE) The GeneXpert MRSA Assay (FDA approved for NASAL specimens only), is one component of a comprehensive MRSA colonization surveillance program. It is not intended to diagnose MRSA infection nor to guide or monitor treatment for MRSA infections. Test performance is not FDA approved in patients less than 34 years old. Performed at [facility], [place] [address], [place], [place] [address]   Respiratory (~20 pathogens) panel by PCR     Status: None   Collection Time: 02/25/22  2:39 PM   Specimen: Nasopharyngeal Swab; Respiratory  Result Value Ref Range Status   Adenovirus NOT DETECTED NOT DETECTED Final   Coronavirus 229E NOT DETECTED NOT DETECTED Final    Comment: (NOTE) The Coronavirus on the Respiratory Panel, DOES NOT test for the novel  Coronavirus (2019 nCoV)    Coronavirus HKU1 NOT DETECTED NOT DETECTED Final   Coronavirus NL63 NOT DETECTED NOT DETECTED Final   Coronavirus OC43 NOT DETECTED NOT DETECTED Final   Metapneumovirus NOT DETECTED NOT DETECTED Final   Rhinovirus / Enterovirus NOT DETECTED NOT DETECTED Final   Influenza A NOT DETECTED NOT DETECTED Final   Influenza B NOT DETECTED NOT DETECTED Final   Parainfluenza Virus 1 NOT DETECTED NOT DETECTED Final   Parainfluenza Virus 2 NOT DETECTED NOT  DETECTED Final   Parainfluenza Virus 3 NOT DETECTED NOT DETECTED Final   Parainfluenza Virus 4 NOT DETECTED NOT DETECTED Final   Respiratory Syncytial Virus NOT DETECTED NOT DETECTED Final   Bordetella pertussis NOT DETECTED NOT DETECTED Final   Bordetella Parapertussis NOT DETECTED NOT DETECTED Final   Chlamydophila pneumoniae NOT DETECTED NOT DETECTED Final   Mycoplasma pneumoniae NOT DETECTED NOT DETECTED Final    Comment: Performed at [facility], [place]. [address], [place],  [address]     Radiology Studies: CT Angio Chest Pulmonary Embolism (PE) W or WO Contrast  Result Date: 02/27/2022 CLINICAL DATA:  Pulmonary embolism (PE) suspected, positive D-dimer EXAM: CT ANGIOGRAPHY CHEST WITH CONTRAST TECHNIQUE: Multidetector CT imaging of the chest was performed using the standard protocol during bolus administration of intravenous contrast. Multiplanar CT image reconstructions and MIPs were obtained to evaluate the vascular anatomy. RADIATION DOSE REDUCTION: This exam was performed according to the departmental dose-optimization program which includes automated exposure control, adjustment of the mA and/or kV according to patient size and/or use of iterative reconstruction technique. CONTRAST:  68m OMNIPAQUE IOHEXOL 350 MG/ML SOLN COMPARISON:  Radiograph earlier today. Chest CT 04/24/2021, portions from PET CT 08/28/2021 FINDINGS: Cardiovascular: Allowing for motion artifact, there is no pulmonary embolus. Motion obscures detailed assessment of the lung bases particularly lower lobes. Mild dilatation of the main pulmonary artery at 3.4 cm. Left-sided pacemaker with intact leads. Mild multi chamber cardiomegaly. There is contrast refluxing into the hepatic veins and IVC. No  pericardial effusion. No thoracic aortic aneurysm or dissection, moderate atherosclerosis. There are coronary artery calcifications. Mediastinum/Nodes: 13 mm subcarinal node series 4, image 48. This previously measured 10  mm. The previous prominent mediastinal lymph nodes have likely improved, but are partially obscured by dense artifact from IV contrast in the SVC. There is high-density material within the distal esophagus and proximal stomach suggesting ingested material, possible reflux or delayed transit. Lungs/Pleura: Ground-glass opacities within both lungs, most prominent involving the right upper lobe, but multifocal involvement. There is progression from prior chest CT. Superimposed septal thickening primarily in the upper lobes. Small right pleural effusion. There is a small amount of fluid in the left inter lobar fissure. There is mild emphysema. No dominant pulmonary mass. Upper Abdomen: Contrast refluxing into the hepatic veins and IVC. Small amount of high-density ingested material in the stomach. Musculoskeletal: There are no acute or suspicious osseous abnormalities. Review of the MIP images confirms the above findings. IMPRESSION: 1. Allowing for motion artifact, no pulmonary embolus. 2. Ground-glass opacities within both lungs, most prominent involving the right upper lobe. Opacities have been present on prior exams, but progressed currently. Waxing and waning atypical infection, interstitial lung disease or less likely asymmetric pulmonary edema are considered. 3. Small right pleural effusion. Mild multi chamber cardiomegaly. Contrast refluxing into the hepatic veins and IVC consistent with elevated right heart pressures. 4. Mild dilatation of the main pulmonary artery suggesting pulmonary arterial hypertension. 5. High-density material within the distal esophagus and proximal stomach suggesting ingested material, possible reflux or delayed transit. Aortic Atherosclerosis (ICD10-I70.0) and Emphysema (ICD10-J43.9). Electronically Signed   By: Keith Rake M.D.   On: 02/27/2022 19:00    Scheduled Meds:  amiodarone  200 mg Oral QPM   amoxicillin-clavulanate  1 tablet Oral Q12H   apixaban  5 mg Oral BID    arformoterol  15 mcg Nebulization BID   ascorbic acid  500 mg Oral Daily   budesonide (PULMICORT) nebulizer solution  0.5 mg Nebulization BID   Chlorhexidine Gluconate Cloth  6 each Topical Daily   cholecalciferol  4,000 Units Oral QPM   doxycycline  100 mg Oral Q12H   empagliflozin  10 mg Oral QAC breakfast   fluticasone  1 spray Each Nare Daily   folic acid  1 mg Oral QPM   gabapentin  300 mg Oral TID   metoprolol tartrate  25 mg Oral BID   mouth rinse  15 mL Mouth Rinse 4 times per day   [START ON 03/07/2022] predniSONE  10 mg Oral Q breakfast   [START ON 03/04/2022] predniSONE  20 mg Oral Q breakfast   predniSONE  30 mg Oral Q breakfast   revefenacin  175 mcg Nebulization Daily   vitamin E  400 Units Oral Daily   Continuous Infusions:  sodium chloride Stopped (02/26/22 1938)     LOS: 8 days   Alma Friendly, MD [facility] P11/01/2022, 6:23 PM

## 2022-03-02 DIAGNOSIS — J189 Pneumonia, unspecified organism: Secondary | ICD-10-CM | POA: Diagnosis not present

## 2022-03-02 DIAGNOSIS — I5032 Chronic diastolic (congestive) heart failure: Secondary | ICD-10-CM | POA: Diagnosis not present

## 2022-03-02 DIAGNOSIS — J9601 Acute respiratory failure with hypoxia: Secondary | ICD-10-CM | POA: Diagnosis not present

## 2022-03-02 DIAGNOSIS — I480 Paroxysmal atrial fibrillation: Secondary | ICD-10-CM | POA: Diagnosis not present

## 2022-03-02 LAB — BASIC METABOLIC PANEL
Anion gap: 6 (ref 5–15)
BUN: 36 mg/dL — ABNORMAL HIGH (ref 8–23)
CO2: 27 mmol/L (ref 22–32)
Calcium: 8.7 mg/dL — ABNORMAL LOW (ref 8.9–10.3)
Chloride: 98 mmol/L (ref 98–111)
Creatinine, Ser: 0.85 mg/dL (ref 0.44–1.00)
GFR, Estimated: >60 mL/min (ref >60)
Glucose, Bld: 81 mg/dL (ref 70–99)
Potassium: 4 mmol/L (ref 3.5–5.1)
Sodium: 131 mmol/L — ABNORMAL LOW (ref 135–145)

## 2022-03-02 LAB — CBC WITH DIFFERENTIAL/PLATELET
Abs Immature Granulocytes: 0.2 10*3/uL — ABNORMAL HIGH (ref 0.00–0.07)
Basophils Absolute: 0 10*3/uL (ref 0.0–0.1)
Basophils Relative: 0 %
Eosinophils Absolute: 0 10*3/uL (ref 0.0–0.5)
Eosinophils Relative: 0 %
HCT: 38.1 % (ref 36.0–46.0)
Hemoglobin: 12.4 g/dL (ref 12.0–15.0)
Immature Granulocytes: 2 %
Lymphocytes Relative: 10 %
Lymphs Abs: 1 10*3/uL (ref 0.7–4.0)
MCH: 30.8 pg (ref 26.0–34.0)
MCHC: 32.5 g/dL (ref 30.0–36.0)
MCV: 94.8 fL (ref 80.0–100.0)
Monocytes Absolute: 1 10*3/uL (ref 0.1–1.0)
Monocytes Relative: 10 %
Neutro Abs: 7.7 10*3/uL (ref 1.7–7.7)
Neutrophils Relative %: 78 %
Platelets: 338 10*3/uL (ref 150–400)
RBC: 4.02 MIL/uL (ref 3.87–5.11)
RDW: 15.9 % — ABNORMAL HIGH (ref 11.5–15.5)
WBC: 10 10*3/uL (ref 4.0–10.5)
nRBC: 0 % (ref 0.0–0.2)

## 2022-03-02 MED ORDER — PREDNISONE 10 MG PO TABS: ORAL_TABLET | ORAL | 0 refills | Status: AC

## 2022-03-02 MED ORDER — AMOXICILLIN-POT CLAVULANATE 875-125 MG PO TABS: 1.0000 | ORAL_TABLET | Freq: Two times a day (BID) | ORAL | 0 refills | Status: AC

## 2022-03-02 MED ORDER — GABAPENTIN 300 MG PO CAPS: 300.0000 mg | ORAL_CAPSULE | Freq: Three times a day (TID) | ORAL | 0 refills | Status: DC

## 2022-03-02 MED ORDER — DOXYCYCLINE HYCLATE 100 MG PO TABS: 100.0000 mg | ORAL_TABLET | Freq: Two times a day (BID) | ORAL | 0 refills | Status: AC

## 2022-03-02 NOTE — Discharge Summary (Addendum)
Physician Discharge Summary   Patient: Sheila Morales MRN: [id number] DOB: 1950/06/04  Admit date:     02/21/2022  Discharge date: 03/02/22  Discharge Physician: Alma Friendly   PCP: Hoyt Koch, MD   Recommendations at discharge:   Follow-up with PCP in 1 week Follow-up with pulmonary as scheduled   Discharge Diagnoses: Principal Problem:   CAP (community acquired pneumonia) Active Problems:   Systemic lupus erythematosus ([place])   COPD (chronic obstructive pulmonary disease) (HCC)   Hyponatremia   Acute respiratory failure with hypoxia (HCC)   Chronic diastolic CHF (congestive heart failure) (HCC)   AF (paroxysmal atrial fibrillation) [facility])    Hospital Course: Patient is  Sheila Morales is a 71 y.o. female with medical history significant of COPD, SLE on methotrexate, interstitial lung disease/eosinophilic pneumonia, chronic diastolic CHF, paroxysmal A-fib, sick sinus syndrome status post pacemaker placement, chronic hyponatremia who presented with complaint of fever, weakness, dyspnea, cough. Patient uses chronically 2 L of oxygen as needed at home for her COPD/ILD. Chest x-ray showed right upper lobe infiltrate. Patient admitted for further management. Hospital course remarkable for persistent dyspnea, respiratory distress requiring BiPAP, had to be transferred to ICU on 11/5.  Patient further stabilized and diuresed.  Patient stable to be discharged with close follow-up.  Husband at bedside daily, very supportive and very involved.  Discussed entire discharge plans and follow-up with patient and husband, they both verbalized understanding.  Assessment and Plan:  Sepsis likely 2/2 community acquired pneumonia- POA Acute on chronic hypoxic respiratory failure (on 2L of O2 prn at home) Secondary to pneumonia and COPD exacerbation Chest x-ray showed possible right upper lobe pneumonia with repeat showing similar with mild improvement D-dimer mildly elevated CTA chest negative  for PE, although motion artifact noted, small right pleural effusion Negative Streptococcus pneumoniae antigen and Legionella antigen Blood culture negative so far Respiratory viral panel negative Discharged on p.o. Augmentin and doxycycline to complete 7 days of antibiotics Discussed with PCCM on 11/11, patient would not require BiPAP upon discharge and should continue with [place] O2 Patient has close follow-up with Dr. Silas Flood pulmonary on 03/07/22 Follow-up with PCP in 1 week   History of COPD/ILD Follows with pulmonology for eosinophilic pneumonia CTA chest showed overall progressive of interstitial lung disease Continue home inhalers Continue slow prednisone taper Follow-up with pulmonology as an outpatient as noted above   SLE On methotrexate   History of paroxysmal A-fib Currently in normal sinus rhythm Status post pacemaker for sick sinus syndrome On Eliquis for anticoagulation. Takes metoprolol for rate control   Hx of Chronic diastolic CHF BNP 4,696, ??acute on chronic Echo showed EF of 65 to 70%, no regional wall motion abnormality, left ventricular diastolic parameters are indeterminate S/p IV lasix with effective diuresis, about 6 L output Resume home lasix regimen  Chronic hyponatremia Noted chronic hyponatremia    Peripheral neuropathy On gabapentin   Weakness/deconditioning PT/OT-outpatient PT/OT        Consultants: PCCM Procedures performed: None Disposition: Home Diet recommendation:  Cardiac diet   DISCHARGE MEDICATION: Allergies as of 03/02/2022   No Known Allergies      Medication List     TAKE these medications    acetaminophen 325 MG tablet Commonly known as: TYLENOL Take 650 mg by mouth every 6 (six) hours as needed for moderate pain or mild pain.   Advanced Joint Relief Caps Take 1 capsule by mouth daily. Joint Advantage Gold 5x   albuterol 108 (90 Base) MCG/ACT inhaler Commonly known  as: VENTOLIN HFA Inhale 1-2 puffs into  the lungs every 6 (six) hours as needed for wheezing or shortness of breath.   amiodarone 200 MG tablet Commonly known as: PACERONE Take 1 tablet (200 mg total) by mouth daily. What changed: when to take this   amoxicillin-clavulanate 875-125 MG tablet Commonly known as: AUGMENTIN Take 1 tablet by mouth every 12 (twelve) hours for 1 dose.   apixaban 5 MG Tabs tablet Commonly known as: ELIQUIS Take 1 tablet (5 mg total) by mouth 2 (two) times daily.   ascorbic acid 500 MG tablet Commonly known as: VITAMIN C Take 500 mg by mouth daily.   benzonatate 200 MG capsule Commonly known as: TESSALON TAKE 1 CAPSULE (200 MG TOTAL) BY MOUTH EVERY 6 (SIX) HOURS AS NEEDED FOR COUGH.   calcium carbonate 1250 (500 Ca) MG tablet Commonly known as: OS-CAL - dosed in mg of elemental calcium Take 1 tablet by mouth every evening.   doxycycline 100 MG tablet Commonly known as: VIBRA-TABS Take 1 tablet (100 mg total) by mouth every 12 (twelve) hours for 1 dose.   empagliflozin 10 MG Tabs tablet Commonly known as: JARDIANCE Take 1 tablet (10 mg total) by mouth daily before breakfast.   fluticasone 50 MCG/ACT nasal spray Commonly known as: FLONASE SPRAY 2 SPRAYS INTO EACH NOSTRIL EVERY DAY   folic acid 1 MG tablet Commonly known as: FOLVITE Take 1 mg by mouth every evening.   furosemide 20 MG tablet Commonly known as: LASIX TAKE 1 TABLET (20 MG) BY MOUTH DAILY, TAKE 1 EXTRA TABLET BY MOUTH (20 MG) AS NEEDED FOR INCREASED SWELLING. What changed: See the new instructions.   gabapentin 300 MG capsule Commonly known as: NEURONTIN Take 1 capsule (300 mg total) by mouth 3 (three) times daily. What changed: See the new instructions.   methotrexate 2.5 MG tablet Commonly known as: RHEUMATREX Take 10 mg by mouth every Thursday.   metoprolol tartrate 25 MG tablet Commonly known as: LOPRESSOR Take 1 tablet (25 mg total) by mouth 2 (two) times daily.   multivitamin with minerals Tabs  tablet Take 1 tablet by mouth daily.   predniSONE 20 MG tablet Commonly known as: DELTASONE Take 2 tablets (40 mg total) by mouth daily. As needed if bronchitis recurs. What changed: Another medication with the same name was added. Make sure you understand how and when to take each.   predniSONE 10 MG tablet Commonly known as: DELTASONE Take 3 tablets (30 mg total) by mouth daily for 1 day, THEN 2 tablets (20 mg total) daily for 3 days, THEN 1 tablet (10 mg total) daily for 3 days. Start taking on: March 03, 2022 What changed: You were already taking a medication with the same name, and this prescription was added. Make sure you understand how and when to take each.   PRESCRIPTION MEDICATION Take 1 tablet by mouth in the morning and at bedtime. Sodium Bicarbonate   Restasis 0.05 % ophthalmic emulsion Generic drug: cycloSPORINE Place 1 drop into both eyes 2 (two) times daily.   Trelegy Ellipta 100-62.5-25 MCG/ACT Aepb Generic drug: Fluticasone-Umeclidin-Vilant Inhale 1 puff into the lungs daily.   triamcinolone cream 0.1 % Commonly known as: KENALOG Apply 1 application. topically 2 (two) times daily. What changed:  when to take this reasons to take this   Vitamin D 50 MCG (2000 UT) tablet Take 4,000 Units by mouth every evening.   vitamin E 180 MG (400 UNITS) capsule Take 400 Units by mouth daily.  zolpidem 5 MG tablet Commonly known as: AMBIEN TAKE 1 TABLET BY MOUTH EVERY DAY AT BEDTIME AS NEEDED FOR SLEEP What changed: See the new instructions.        Follow-up Information     Hoyt Koch, MD. Schedule an appointment as soon as possible for a visit in 1 week(s).   Specialty: Internal Medicine Contact information: [place] [address] [phone]                Discharge Exam: Danley Danker Weights   02/23/22 1741 02/27/22 0542  Weight: 60 kg 60.7 kg   General: NAD, chronically ill-appearing Cardiovascular: S1, S2  present Respiratory: CTA B with some bibasilar crackles Abdomen: Soft, nontender, nondistended, bowel sounds present Musculoskeletal: No bilateral pedal edema noted Skin: Normal Psychiatry: Normal mood   Condition at discharge: stable  The results of significant diagnostics from this hospitalization (including imaging, microbiology, ancillary and laboratory) are listed below for reference.   Imaging Studies: CT Angio Chest Pulmonary Embolism (PE) W or WO Contrast  Result Date: 02/27/2022 CLINICAL DATA:  Pulmonary embolism (PE) suspected, positive D-dimer EXAM: CT ANGIOGRAPHY CHEST WITH CONTRAST TECHNIQUE: Multidetector CT imaging of the chest was performed using the standard protocol during bolus administration of intravenous contrast. Multiplanar CT image reconstructions and MIPs were obtained to evaluate the vascular anatomy. RADIATION DOSE REDUCTION: This exam was performed according to the departmental dose-optimization program which includes automated exposure control, adjustment of the mA and/or kV according to patient size and/or use of iterative reconstruction technique. CONTRAST:  32m OMNIPAQUE IOHEXOL 350 MG/ML SOLN COMPARISON:  Radiograph earlier today. Chest CT 04/24/2021, portions from PET CT 08/28/2021 FINDINGS: Cardiovascular: Allowing for motion artifact, there is no pulmonary embolus. Motion obscures detailed assessment of the lung bases particularly lower lobes. Mild dilatation of the main pulmonary artery at 3.4 cm. Left-sided pacemaker with intact leads. Mild multi chamber cardiomegaly. There is contrast refluxing into the hepatic veins and IVC. No pericardial effusion. No thoracic aortic aneurysm or dissection, moderate atherosclerosis. There are coronary artery calcifications. Mediastinum/Nodes: 13 mm subcarinal node series 4, image 48. This previously measured 10 mm. The previous prominent mediastinal lymph nodes have likely improved, but are partially obscured by dense artifact  from IV contrast in the SVC. There is high-density material within the distal esophagus and proximal stomach suggesting ingested material, possible reflux or delayed transit. Lungs/Pleura: Ground-glass opacities within both lungs, most prominent involving the right upper lobe, but multifocal involvement. There is progression from prior chest CT. Superimposed septal thickening primarily in the upper lobes. Small right pleural effusion. There is a small amount of fluid in the left inter lobar fissure. There is mild emphysema. No dominant pulmonary mass. Upper Abdomen: Contrast refluxing into the hepatic veins and IVC. Small amount of high-density ingested material in the stomach. Musculoskeletal: There are no acute or suspicious osseous abnormalities. Review of the MIP images confirms the above findings. IMPRESSION: 1. Allowing for motion artifact, no pulmonary embolus. 2. Ground-glass opacities within both lungs, most prominent involving the right upper lobe. Opacities have been present on prior exams, but progressed currently. Waxing and waning atypical infection, interstitial lung disease or less likely asymmetric pulmonary edema are considered. 3. Small right pleural effusion. Mild multi chamber cardiomegaly. Contrast refluxing into the hepatic veins and IVC consistent with elevated right heart pressures. 4. Mild dilatation of the main pulmonary artery suggesting pulmonary arterial hypertension. 5. High-density material within the distal esophagus and proximal stomach suggesting ingested material, possible reflux or  delayed transit. Aortic Atherosclerosis (ICD10-I70.0) and Emphysema (ICD10-J43.9). Electronically Signed   By: Keith Rake M.D.   On: 02/27/2022 19:00   CUP PACEART REMOTE DEVICE CHECK  Result Date: 02/26/2022 Scheduled remote reviewed. Normal device function.  Next remote 91 days. LA  DG CHEST PORT 1 VIEW  Result Date: 02/24/2022 CLINICAL DATA:  [id number] with shortness of breath. Currently  being treated for pneumonia. EXAM: PORTABLE CHEST 1 VIEW COMPARISON:  Portable chest 02/21/2022 FINDINGS: 6:43 a.m. Left chest dual lead pacing system and wire insertions are unaltered. There is cardiomegaly. There is slight central vascular prominence with improvement. Interstitial and patchy hazy alveolar opacities of the right lung fields, scattered opacities in the left mid to lower lung field are again noted, most dense opacities again in the right upper lobe where they are unchanged, elsewhere the opacities slightly improved. Small pleural effusions appear similar. There is no new or worsening infiltrate. Stable mediastinum with aortic atherosclerotic.  Osteopenia. IMPRESSION: 1. Improvement in central vascular prominence. 2. Interstitial and patchy hazy alveolar opacities in the right lung fields and scattered opacities in the left mid to lower lung field, slightly improved except in the right upper lobe. 3. Stable small pleural effusions. 4. Cardiomegaly. 5. Aortic atherosclerosis. Electronically Signed   By: Telford Nab M.D.   On: 02/24/2022 07:01   DG Chest Port 1 View  Result Date: 02/22/2022 CLINICAL DATA:  Respiratory distress EXAM: PORTABLE CHEST 1 VIEW COMPARISON:  10:53 a.m. FINDINGS: The lungs are symmetrically expanded. Superimposed diffuse interstitial and airspace infiltrate is present, more focal within the right upper lobe, stable since prior examination in keeping with atypical infection or asymmetric pulmonary edema. No pneumothorax or pleural effusion. Cardiac size is mildly enlarged. Left subclavian dual lead pacemaker is unchanged. No acute bone abnormality. IMPRESSION: 1. Stable asymmetric diffuse pulmonary infiltrate, in keeping with atypical infection or asymmetric pulmonary edema. 2. Mild cardiomegaly. Electronically Signed   By: Fidela Salisbury M.D.   On: 02/22/2022 00:24   DG Chest 2 View  Addendum Date: 02/21/2022   ADDENDUM REPORT: 02/21/2022 12:09 ADDENDUM: By comparison  with prior exams, note that the left sided marker on this exam was incorrectly placed on the patient's right side. Electronically Signed   By: Marlaine Hind M.D.   On: 02/21/2022 12:09   Result Date: 02/21/2022 CLINICAL DATA:  Shortness of breath and fatigue. EXAM: CHEST - 2 VIEW COMPARISON:  11/27/2021 FINDINGS: Heart size remains at the upper limits of normal. Permanent pacemaker remains in appropriate position. Asymmetric airspace disease is seen in the right upper lobe, suspicious for pneumonia. Bibasilar interstitial prominence shows no significant change compared to prior exam. No evidence of pleural effusion. IMPRESSION: Asymmetric right upper lobe airspace disease, suspicious for pneumonia. Recommend continued chest radiographic follow-up to confirm resolution. Electronically Signed: By: Marlaine Hind M.D. On: 02/21/2022 11:03    Microbiology: Results for orders placed or performed during the hospital encounter of 02/21/22  Culture, blood (Routine x 2)     Status: None   Collection Time: 02/21/22 10:32 AM   Specimen: BLOOD  Result Value Ref Range Status   Specimen Description   Final    BLOOD RIGHT ANTECUBITAL Performed at [facility], [place] [address], [place], [place] [address]    Special Requests   Final    BOTTLES DRAWN AEROBIC AND ANAEROBIC Blood Culture adequate volume Performed at [facility], [address] [place], [place] [address]    Culture   Final  NO GROWTH 5 DAYS Performed at [facility], [place] [address], [place], [place] [address]    Report Status 02/26/2022 FINAL  Final  SARS Coronavirus 2 by RT PCR (hospital order, performed in [facility] hospital lab) *cepheid single result test* Anterior Nasal Swab     Status: None   Collection Time: 02/21/22 11:34 AM   Specimen: Anterior Nasal Swab  Result Value Ref Range Status   SARS Coronavirus 2 by RT PCR NEGATIVE NEGATIVE Final    Comment: (NOTE) SARS-CoV-2 target nucleic acids are NOT  DETECTED.  The SARS-CoV-2 RNA is generally detectable in upper and lower respiratory specimens during the acute phase of infection. The lowest concentration of SARS-CoV-2 viral copies this assay can detect is 250 copies / mL. A negative result does not preclude SARS-CoV-2 infection and should not be used as the sole basis for treatment or other patient management decisions.  A negative result may occur with improper specimen collection / handling, submission of specimen other than nasopharyngeal swab, presence of viral mutation(s) within the areas targeted by this assay, and inadequate number of viral copies (<250 copies / mL). A negative result must be combined with clinical observations, patient history, and epidemiological information.  Fact Sheet for Patients:   https://www.patel.info/  Fact Sheet for Healthcare Providers: https://hall.com/  This test is not yet approved or  cleared by the [place] FDA and has been authorized for detection and/or diagnosis of SARS-CoV-2 by FDA under an Emergency Use Authorization (EUA).  This EUA will remain in effect (meaning this test can be used) for the duration of the COVID-19 declaration under Section 564(b)(1) of the Act, 21 U.S.C. section 360bbb-3(b)(1), unless the authorization is terminated or revoked sooner.  Performed at [facility], [address] [place], [place] [address]   Culture, blood (Routine x 2)     Status: None   Collection Time: 02/21/22 11:34 AM   Specimen: BLOOD  Result Value Ref Range Status   Specimen Description   Final    BLOOD BLOOD LEFT HAND Performed at [facility], [address] [place], [place] [address]    Special Requests   Final    BOTTLES DRAWN AEROBIC AND ANAEROBIC Blood Culture adequate volume Performed at [facility], [address] [place], [place] [address]    Culture   Final     NO GROWTH 5 DAYS Performed at [facility], [place] [address], [place], [place] [address]    Report Status 02/26/2022 FINAL  Final  MRSA Next Gen by PCR, Nasal     Status: None   Collection Time: 02/24/22 12:58 PM   Specimen: Nasal Mucosa; Nasal Swab  Result Value Ref Range Status   MRSA by PCR Next Gen NOT DETECTED NOT DETECTED Final    Comment: (NOTE) The GeneXpert MRSA Assay (FDA approved for NASAL specimens only), is one component of a comprehensive MRSA colonization surveillance program. It is not intended to diagnose MRSA infection nor to guide or monitor treatment for MRSA infections. Test performance is not FDA approved in patients less than 2 years old. Performed at [facility], [place] [address], [place], [place] [address]   Respiratory (~20 pathogens) panel by PCR     Status: None   Collection Time: 02/25/22  2:39 PM   Specimen: Nasopharyngeal Swab; Respiratory  Result Value Ref Range Status   Adenovirus NOT DETECTED NOT DETECTED Final   Coronavirus 229E NOT DETECTED NOT DETECTED Final  Comment: (NOTE) The Coronavirus on the Respiratory Panel, DOES NOT test for the novel  Coronavirus (2019 nCoV)    Coronavirus HKU1 NOT DETECTED NOT DETECTED Final   Coronavirus NL63 NOT DETECTED NOT DETECTED Final   Coronavirus OC43 NOT DETECTED NOT DETECTED Final   Metapneumovirus NOT DETECTED NOT DETECTED Final   Rhinovirus / Enterovirus NOT DETECTED NOT DETECTED Final   Influenza A NOT DETECTED NOT DETECTED Final   Influenza B NOT DETECTED NOT DETECTED Final   Parainfluenza Virus 1 NOT DETECTED NOT DETECTED Final   Parainfluenza Virus 2 NOT DETECTED NOT DETECTED Final   Parainfluenza Virus 3 NOT DETECTED NOT DETECTED Final   Parainfluenza Virus 4 NOT DETECTED NOT DETECTED Final   Respiratory Syncytial Virus NOT DETECTED NOT DETECTED Final   Bordetella pertussis NOT DETECTED NOT DETECTED Final   Bordetella Parapertussis NOT DETECTED NOT DETECTED Final    Chlamydophila pneumoniae NOT DETECTED NOT DETECTED Final   Mycoplasma pneumoniae NOT DETECTED NOT DETECTED Final    Comment: Performed at [facility], [place] [address], [place], [place] [address]    Labs: CBC: Recent Labs  Lab 02/26/22 [id number] 02/27/22 0307 02/28/22 0329 03/01/22 0247 03/02/22 0306  WBC 14.1* 13.5* 12.4* 10.6* 10.0  NEUTROABS  --   --  10.8* 8.7* 7.7  HGB 10.9* 10.8* 12.3 12.1 12.4  HCT 33.7* 32.7* 37.6 37.2 38.1  MCV 95.7 95.6 94.9 94.7 94.8  PLT 307 306 307 329 425   Basic Metabolic Panel: Recent Labs  Lab 02/26/22 0731 02/27/22 0307 02/28/22 0329 03/01/22 0247 03/02/22 0306  NA 132* 133* 132* 131* 131*  K 4.3 4.4 4.6 4.0 4.0  CL 99 100 99 97* 98  CO2 '25 26 23 25 27  '$ GLUCOSE 108* 142* 83 85 81  BUN 27* 31* 28* 30* 36*  CREATININE 1.14* 1.05* 0.89 0.78 0.85  CALCIUM 8.9 8.9 9.1 9.0 8.7*   Liver Function Tests: No results for input(s): "AST", "ALT", "ALKPHOS", "BILITOT", "PROT", "ALBUMIN" in the last 168 hours. CBG: No results for input(s): "GLUCAP" in the last 168 hours.  Discharge time spent: greater than 30 minutes.  Signed: Alma Friendly, MD [facility] 03/02/2022

## 2022-03-04 ENCOUNTER — Telehealth: Payer: Self-pay

## 2022-03-04 NOTE — Telephone Encounter (Signed)
Transition Care Management Follow-up Telephone Call Date of discharge and from where:    [place] 03-02-22 Dx: CAP     How have you been since you were released from the hospital? Feeling better  Any questions or concerns? Yes- pt husband states this hospitalization pt did not receive any salt tablets for 7 days and sodium was fine-   Items Reviewed: Did the pt receive and understand the discharge instructions provided? Yes  Medications obtained and verified? Yes  Other? No  Any new allergies since your discharge? No  Dietary orders reviewed? yes Do you have support at home? Yes   Home Care and Equipment/Supplies: Were home health services ordered? Yes PT If so, what is the name of the agency? Unsure of name   Has the agency set up a time to come to the patient's home? not applicable Were any new equipment or medical supplies ordered?  No What is the name of the medical supply agency? na Were you able to get the supplies/equipment? not applicable Do you have any questions related to the use of the equipment or supplies? No  Functional Questionnaire: (I = Independent and D = Dependent) ADLs: I  Bathing/Dressing- I  Meal Prep- I  Eating- I  Maintaining continence- I  Transferring/Ambulation- I  Managing Meds- D- husband manages   Follow up appointments reviewed:  PCP Hospital f/u appt confirmed? Yes  Scheduled to see Dr Sharlet Salina on 03-12-22 @ 140pm. [facility] f/u appt confirmed? Yes  Scheduled to see Dr Royal Piedra on 03-07-22 @ 1130am. Are transportation arrangements needed? No  If their condition worsens, is the pt aware to call PCP or go to the Emergency Dept.? Yes Was the patient provided with contact information for the PCP's office or ED? Yes Was to pt encouraged to call back with questions or concerns? Yes   Juanda Crumble LPN [place] Direct Dial [phone]

## 2022-03-05 DIAGNOSIS — J849 Interstitial pulmonary disease, unspecified: Secondary | ICD-10-CM | POA: Diagnosis not present

## 2022-03-05 DIAGNOSIS — U071 COVID-19: Secondary | ICD-10-CM | POA: Diagnosis not present

## 2022-03-07 ENCOUNTER — Ambulatory Visit (INDEPENDENT_AMBULATORY_CARE_PROVIDER_SITE_OTHER): Payer: PPO | Admitting: Adult Health

## 2022-03-07 ENCOUNTER — Encounter: Payer: Self-pay | Admitting: Adult Health

## 2022-03-07 VITALS — BP 110/70 | HR 69 | Temp 97.5°F | Ht 62.0 in | Wt 125.6 lb

## 2022-03-07 DIAGNOSIS — J439 Emphysema, unspecified: Secondary | ICD-10-CM

## 2022-03-07 DIAGNOSIS — J849 Interstitial pulmonary disease, unspecified: Secondary | ICD-10-CM

## 2022-03-07 DIAGNOSIS — I5032 Chronic diastolic (congestive) heart failure: Secondary | ICD-10-CM

## 2022-03-07 LAB — BASIC METABOLIC PANEL
BUN: 32 mg/dL — ABNORMAL HIGH (ref 6–23)
CO2: 28 mEq/L (ref 19–32)
Calcium: 9.3 mg/dL (ref 8.4–10.5)
Chloride: 92 mEq/L — ABNORMAL LOW (ref 96–112)
Creatinine, Ser: 1.2 mg/dL (ref 0.40–1.20)
GFR: 45.28 mL/min — ABNORMAL LOW (ref >60.00)
Glucose, Bld: 103 mg/dL — ABNORMAL HIGH (ref 70–99)
Potassium: 4.2 mEq/L (ref 3.5–5.1)
Sodium: 128 mEq/L — ABNORMAL LOW (ref 135–145)

## 2022-03-07 NOTE — Patient Instructions (Addendum)
Continue on ANORO 1 puff daily  Albuterol inhaler As needed   Labs today .  PT as able.  Continue on Oxygen 2l/m with activity and At bedtime  as needed to keep O2 sats >88-90%.  Home sleep study when available  Follow up with Dr. Silas Flood in 2-3 weeks as planned with chest xray and As needed   Please contact office for sooner follow up if symptoms do not improve or worsen or seek emergency care

## 2022-03-07 NOTE — Progress Notes (Signed)
$'@Patient'm$  ID: Sheila Morales, female    DOB: 09-Oct-1950, 71 y.o.   MRN: [id number]  Chief Complaint  Patient presents with   Hospitalization Follow-up    Referring provider: Hoyt Koch, *  HPI: 71 year old female being followed for chronic pulmonary infiltrates suspicious for chronic eosinophilic pneumonia (peripheral eosinophilia and resolution of cough and fever with steroids), chronic respiratory failure, COPD Medical history significant for SLE on methotrexate, A-fib, sick sinus syndrome status post pacemaker, chronic hyponatremia  TEST/EVENTS :  Groundglass opacities within both lungs increase in the right upper lobe appear progressive, small right pleural effusion, cardiomegaly, high density material in the distal esophagus questionable reflux or delayed transit  2D echo January 10, 2022 EF 65 to 70%, LVH, normal pulmonary artery systolic pressure, mild to moderate tricuspid valve regurg  03/07/2022 Follow up : Pulmonary infiltrates suspicious for chronic eosinophilic pneumonia, chronic respiratory failure, COPD Patient returns for a posthospital follow-up.  Patient was recently hospitalized last week for sepsis secondary to community-acquired pneumonia and acute on chronic hypoxic respiratory failure.  Strep pneumoniae and Legionella antigens were negative.  Blood cultures were negative.  Viral panel was negative.  Patient was treated with empiric antibiotics Augmentin and doxycycline.  And placed on a slow prednisone taper.  Treated for volume overload with decompensated diastolic heart failure.  Improved with diuresis. Since discharge patient says she is feeling better.  Does still feel weak and have low energy.  She remains on Norco daily.  She has started physical therapy.  Patient has underlying symptoms suspicious for sleep apnea and has a home sleep study that is pending.  She remains on oxygen 2 L with activity and at bedtime.    No Known Allergies  Immunization  History  Administered Date(s) Administered   Fluad Quad(high Dose 65+) 03/29/2019   H1N1 05/31/2008   Influenza Split 02/13/2012   Influenza Whole 01/21/2008, 01/17/2009   Influenza, High Dose Seasonal PF 02/19/2016, 02/05/2018   Influenza,inj,Quad PF,6+ Mos 01/21/2014   Influenza-Unspecified 02/02/2015, 02/03/2017   Pneumococcal Conjugate-13 02/19/2016   Pneumococcal Polysaccharide-23 02/24/2017   Tdap 03/29/2019    Past Medical History:  Diagnosis Date   A-fib [facility])    Acute respiratory failure with hypoxia ([place]) 11/05/2017   Arthritis    CHF (congestive heart failure) (HCC)    Colitis, ischemic (HCC)    COPD (chronic obstructive pulmonary disease) (HCC)    External hemorrhoids    H/O: rheumatic fever    IBS (irritable bowel syndrome)    Pacemaker    Personal history colonic adenoma 03/25/2008   06/2007 right sided adenoma and 2 right hyperplastic polyps     Restless leg syndrome    questionable   Systemic lupus erythematosus (HCC)    Tubular adenoma of colon    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  albuterol (VENTOLIN HFA) 108 (90 Base) MCG/ACT inhaler Inhale 1-2 puffs into the lungs every 6 (six) hours as needed for wheezing or shortness of breath. 8 g 5   amiodarone (PACERONE) 200 MG tablet Take 1 tablet (200 mg total) by mouth daily. (Patient taking differently: Take 200 mg by mouth every  evening.) 90 tablet 3   apixaban (ELIQUIS) 5 MG TABS tablet Take 1 tablet (5 mg total) by mouth 2 (two) times daily. 180 tablet 0   benzonatate (TESSALON) 200 MG capsule TAKE 1 CAPSULE (200 MG TOTAL) BY MOUTH EVERY 6 (SIX) HOURS AS NEEDED FOR COUGH. 30 capsule 1   calcium carbonate (OS-CAL - DOSED IN MG OF ELEMENTAL CALCIUM) 1250 (500 Ca) MG tablet Take 1 tablet by mouth every evening.     Cholecalciferol (VITAMIN D) 50 MCG (2000 UT) tablet Take 4,000 Units by mouth every evening.     empagliflozin (JARDIANCE) 10 MG TABS tablet Take 1 tablet (10 mg total) by mouth daily before breakfast. 30 tablet 5   fluticasone (FLONASE) 50 MCG/ACT nasal spray SPRAY 2 SPRAYS INTO EACH NOSTRIL EVERY DAY 48 mL 3   Fluticasone-Umeclidin-Vilant (TRELEGY ELLIPTA) 100-62.5-25 MCG/ACT AEPB Inhale 1 puff into the lungs daily. 60 each 6   folic acid (FOLVITE) 1 MG tablet Take 1 mg by mouth every evening.     furosemide (LASIX) 20 MG tablet TAKE 1 TABLET (20 MG) BY MOUTH DAILY, TAKE 1 EXTRA TABLET BY MOUTH (20 MG) AS NEEDED FOR INCREASED SWELLING. (Patient taking differently: Take 20 mg by mouth See admin instructions. TAKE 1 TABLET (20 MG) BY MOUTH DAILY, TAKE 1 EXTRA TABLET BY MOUTH (20 MG) AS NEEDED FOR INCREASED SWELLING.) 90 tablet 3   gabapentin (NEURONTIN) 300 MG capsule Take 1 capsule (300 mg total) by mouth 3 (three) times daily. 630 capsule 0   methotrexate 2.5 MG tablet Take 10 mg by mouth every Thursday.     metoprolol tartrate (LOPRESSOR) 25 MG tablet Take 1 tablet (25 mg total) by mouth 2 (two) times daily. 180 tablet 3   Misc Natural Products (ADVANCED JOINT RELIEF) CAPS Take 1 capsule by mouth daily. Joint Advantage Gold 5x     Multiple Vitamin (MULTIVITAMIN WITH MINERALS) TABS tablet Take 1 tablet by mouth daily.     predniSONE (DELTASONE) 10 MG tablet Take 3 tablets (30 mg total) by mouth daily for 1 day, THEN 2 tablets (20 mg total) daily for 3 days, THEN 1 tablet (10 mg total) daily for 3 days. 12 tablet 0    predniSONE (DELTASONE) 20 MG tablet Take 2 tablets (40 mg total) by mouth daily. As needed if bronchitis recurs. (Patient not taking: Reported on 03/04/2022) 10 tablet 1   RESTASIS 0.05 % ophthalmic emulsion Place 1 drop into both eyes 2 (two) times daily.  6   triamcinolone cream (KENALOG) 0.1 % Apply 1 application. topically 2 (two) times daily. (Patient taking differently: Apply 1 application  topically as needed.) 30 g 0   vitamin C (ASCORBIC ACID) 500 MG tablet Take 500 mg by mouth daily.     vitamin E 180 MG (400 UNITS) capsule Take 400 Units by mouth daily.     zolpidem (AMBIEN) 5 MG tablet TAKE 1 TABLET BY MOUTH EVERY DAY AT BEDTIME AS NEEDED FOR SLEEP (Patient taking differently: Take 5 mg by mouth at bedtime as needed for sleep.) 30 tablet 5   No facility-administered medications prior to visit.     Review of Systems:   Constitutional:  No  weight loss, night sweats,  Fevers, chills, fatigue, or  lassitude.  HEENT:   No headaches,  Difficulty swallowing,  Tooth/dental problems, or  Sore throat,                No sneezing, itching, ear ache, nasal congestion, post nasal drip,   CV:  No chest pain,  Orthopnea, PND, swelling in lower extremities, anasarca, dizziness, palpitations, syncope.   GI  No heartburn, indigestion, abdominal pain, nausea, vomiting, diarrhea, change in bowel habits, loss of appetite, bloody stools.   Resp: No shortness of breath with exertion or at rest.  No excess mucus, no productive cough,  No non-productive cough,  No coughing up of blood.  No change in color of mucus.  No wheezing.  No chest wall deformity  Skin: no rash or lesions.  GU: no dysuria, change in color of urine, no urgency or frequency.  No flank pain, no hematuria   MS:  No joint pain or swelling.  No decreased range of motion.  No back pain.    Physical Exam  There were no vitals taken for this visit.  GEN: A/Ox3; pleasant , NAD, well nourished    HEENT:  [place]/AT,  EACs-clear,  TMs-wnl, NOSE-clear, THROAT-clear, no lesions, no postnasal drip or exudate noted.   NECK:  Supple w/ fair ROM; no JVD; normal carotid impulses w/o bruits; no thyromegaly or nodules palpated; no lymphadenopathy.    RESP  Clear  P & A; w/o, wheezes/ rales/ or rhonchi. no accessory muscle use, no dullness to percussion  CARD:  RRR, no m/r/g, no peripheral edema, pulses intact, no cyanosis or clubbing.  GI:   Soft & nt; nml bowel sounds; no organomegaly or masses detected.   Musco: Warm bil, no deformities or joint swelling noted.   Neuro: alert, no focal deficits noted.    Skin: Warm, no lesions or rashes    Lab Results:  CBC    Component Value Date/Time   WBC 10.0 03/02/2022 0306   RBC 4.02 03/02/2022 0306   HGB 12.4 03/02/2022 0306   HGB 11.9 05/22/2021 0842   HGB 13.1 01/13/2009 1108   HCT 38.1 03/02/2022 0306   HCT 35.3 05/22/2021 0842   HCT 37.5 01/13/2009 1108   PLT 338 03/02/2022 0306   PLT 194 05/22/2021 0842   MCV 94.8 03/02/2022 0306   MCV 92 05/22/2021 0842   MCV 86.2 01/13/2009 1108   MCH 30.8 03/02/2022 0306   MCHC 32.5 03/02/2022 0306   RDW 15.9 (H) 03/02/2022 0306   RDW 14.5 05/22/2021 0842   RDW 12.2 01/13/2009 1108   LYMPHSABS 1.0 03/02/2022 0306   LYMPHSABS 1.2 05/11/2020 1203   LYMPHSABS 2.2 01/13/2009 1108   MONOABS 1.0 03/02/2022 0306   MONOABS 0.7 01/13/2009 1108   EOSABS 0.0 03/02/2022 0306   EOSABS 0.1 05/11/2020 1203   BASOSABS 0.0 03/02/2022 0306   BASOSABS 0.0 05/11/2020 1203   BASOSABS 0.0 01/13/2009 1108    BMET    Component Value Date/Time   NA 131 (L) 03/02/2022 0306   NA 136 08/13/2021 1116   K 4.0 03/02/2022 0306   CL 98 03/02/2022 0306   CO2 27 03/02/2022 0306   GLUCOSE 81 03/02/2022 0306   BUN 36 (H) 03/02/2022 0306   BUN 17 08/13/2021 1116   CREATININE 0.85 03/02/2022 0306   CREATININE 0.91 11/22/2019 1502   CALCIUM 8.7 (L) 03/02/2022 0306   GFRNONAA >60 03/02/2022 0306   GFRAA 89 05/11/2020 1203  BNP     Component Value Date/Time   BNP 1,399.4 (H) 02/27/2022 0307    ProBNP    Component Value Date/Time   PROBNP 1,033.0 (H) 07/09/2021 1242    Imaging: CT Angio Chest Pulmonary Embolism (PE) W or WO Contrast  Result Date: 02/27/2022 CLINICAL DATA:  Pulmonary embolism (PE) suspected, positive D-dimer EXAM: CT ANGIOGRAPHY CHEST WITH CONTRAST TECHNIQUE: Multidetector CT imaging of the chest was performed using the standard protocol during bolus administration of intravenous contrast. Multiplanar CT image reconstructions and MIPs were obtained to evaluate the vascular anatomy. RADIATION DOSE REDUCTION: This exam was performed according to the departmental dose-optimization program which includes automated exposure control, adjustment of the mA and/or kV according to patient size and/or use of iterative reconstruction technique. CONTRAST:  72m OMNIPAQUE IOHEXOL 350 MG/ML SOLN COMPARISON:  Radiograph earlier today. Chest CT 04/24/2021, portions from PET CT 08/28/2021 FINDINGS: Cardiovascular: Allowing for motion artifact, there is no pulmonary embolus. Motion obscures detailed assessment of the lung bases particularly lower lobes. Mild dilatation of the main pulmonary artery at 3.4 cm. Left-sided pacemaker with intact leads. Mild multi chamber cardiomegaly. There is contrast refluxing into the hepatic veins and IVC. No pericardial effusion. No thoracic aortic aneurysm or dissection, moderate atherosclerosis. There are coronary artery calcifications. Mediastinum/Nodes: 13 mm subcarinal node series 4, image 48. This previously measured 10 mm. The previous prominent mediastinal lymph nodes have likely improved, but are partially obscured by dense artifact from IV contrast in the SVC. There is high-density material within the distal esophagus and proximal stomach suggesting ingested material, possible reflux or delayed transit. Lungs/Pleura: Ground-glass opacities within both lungs, most prominent involving the  right upper lobe, but multifocal involvement. There is progression from prior chest CT. Superimposed septal thickening primarily in the upper lobes. Small right pleural effusion. There is a small amount of fluid in the left inter lobar fissure. There is mild emphysema. No dominant pulmonary mass. Upper Abdomen: Contrast refluxing into the hepatic veins and IVC. Small amount of high-density ingested material in the stomach. Musculoskeletal: There are no acute or suspicious osseous abnormalities. Review of the MIP images confirms the above findings. IMPRESSION: 1. Allowing for motion artifact, no pulmonary embolus. 2. Ground-glass opacities within both lungs, most prominent involving the right upper lobe. Opacities have been present on prior exams, but progressed currently. Waxing and waning atypical infection, interstitial lung disease or less likely asymmetric pulmonary edema are considered. 3. Small right pleural effusion. Mild multi chamber cardiomegaly. Contrast refluxing into the hepatic veins and IVC consistent with elevated right heart pressures. 4. Mild dilatation of the main pulmonary artery suggesting pulmonary arterial hypertension. 5. High-density material within the distal esophagus and proximal stomach suggesting ingested material, possible reflux or delayed transit. Aortic Atherosclerosis (ICD10-I70.0) and Emphysema (ICD10-J43.9). Electronically Signed   By: MKeith RakeM.D.   On: 02/27/2022 19:00   CUP PACEART REMOTE DEVICE CHECK  Result Date: 02/26/2022 Scheduled remote reviewed. Normal device function.  Next remote 91 days. LA  DG CHEST PORT 1 VIEW  Result Date: 02/24/2022 CLINICAL DATA:  [id number] shortness of breath. Currently being treated for pneumonia. EXAM: PORTABLE CHEST 1 VIEW COMPARISON:  Portable chest 02/21/2022 FINDINGS: 6:43 a.m. Left chest dual lead pacing system and wire insertions are unaltered. There is cardiomegaly. There is slight central vascular prominence with  improvement. Interstitial and patchy hazy alveolar opacities of the right lung fields, scattered opacities in the left mid to lower lung field are again noted, most dense opacities again in  the right upper lobe where they are unchanged, elsewhere the opacities slightly improved. Small pleural effusions appear similar. There is no new or worsening infiltrate. Stable mediastinum with aortic atherosclerotic.  Osteopenia. IMPRESSION: 1. Improvement in central vascular prominence. 2. Interstitial and patchy hazy alveolar opacities in the right lung fields and scattered opacities in the left mid to lower lung field, slightly improved except in the right upper lobe. 3. Stable small pleural effusions. 4. Cardiomegaly. 5. Aortic atherosclerosis. Electronically Signed   By: Telford Nab M.D.   On: 02/24/2022 07:01   DG Chest Port 1 View  Result Date: 02/22/2022 CLINICAL DATA:  Respiratory distress EXAM: PORTABLE CHEST 1 VIEW COMPARISON:  10:53 a.m. FINDINGS: The lungs are symmetrically expanded. Superimposed diffuse interstitial and airspace infiltrate is present, more focal within the right upper lobe, stable since prior examination in keeping with atypical infection or asymmetric pulmonary edema. No pneumothorax or pleural effusion. Cardiac size is mildly enlarged. Left subclavian dual lead pacemaker is unchanged. No acute bone abnormality. IMPRESSION: 1. Stable asymmetric diffuse pulmonary infiltrate, in keeping with atypical infection or asymmetric pulmonary edema. 2. Mild cardiomegaly. Electronically Signed   By: Fidela Salisbury M.D.   On: 02/22/2022 00:24   DG Chest 2 View  Addendum Date: 02/21/2022   ADDENDUM REPORT: 02/21/2022 12:09 ADDENDUM: By comparison with prior exams, note that the left sided marker on this exam was incorrectly placed on the patient's right side. Electronically Signed   By: Marlaine Hind M.D.   On: 02/21/2022 12:09   Result Date: 02/21/2022 CLINICAL DATA:  Shortness of breath and  fatigue. EXAM: CHEST - 2 VIEW COMPARISON:  11/27/2021 FINDINGS: Heart size remains at the upper limits of normal. Permanent pacemaker remains in appropriate position. Asymmetric airspace disease is seen in the right upper lobe, suspicious for pneumonia. Bibasilar interstitial prominence shows no significant change compared to prior exam. No evidence of pleural effusion. IMPRESSION: Asymmetric right upper lobe airspace disease, suspicious for pneumonia. Recommend continued chest radiographic follow-up to confirm resolution. Electronically Signed: By: Marlaine Hind M.D. On: 02/21/2022 11:03         Latest Ref Rng & Units 02/06/2017    3:09 PM  PFT Results  FVC-Pre L 2.14   FVC-Predicted Pre % 72   FVC-Post L 2.22   FVC-Predicted Post % 74   Pre FEV1/FVC % % 60   Post FEV1/FCV % % 59   FEV1-Pre L 1.28   FEV1-Predicted Pre % 56   FEV1-Post L 1.30   DLCO uncorrected ml/min/mmHg 12.45   DLCO UNC% % 54   DLCO corrected ml/min/mmHg 12.34   DLCO COR %Predicted % 53   DLVA Predicted % 66   TLC L 4.65   TLC % Predicted % 94   RV % Predicted % 107     No results found for: "NITRICOXIDE"      Assessment & Plan:   No problem-specific Assessment & Plan notes found for this encounter.     Rexene Edison, NP 03/07/2022

## 2022-03-08 NOTE — Assessment & Plan Note (Signed)
Recent flare-completed a prednisone taper.  Continue on Anoro daily.  Plan m Patient Instructions  Continue on ANORO 1 puff daily  Albuterol inhaler As needed   Labs today .  PT as able.  Continue on Oxygen 2l/m with activity and At bedtime  as needed to keep O2 sats >88-90%.  Home sleep study when available  Follow up with Dr. Silas Flood in 2-3 weeks as planned with chest xray and As needed   Please contact office for sooner follow up if symptoms do not improve or worsen or seek emergency care

## 2022-03-08 NOTE — Assessment & Plan Note (Addendum)
Recent decompensation required IV diuresis during hospitalization.  Continue on current regimen.  Check bmet today

## 2022-03-08 NOTE — Assessment & Plan Note (Signed)
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  In the past has been steroid responsive.  Had peripheral eosinophilia.  She is also on methotrexate and amiodarone.   Patient was treated with empiric antibiotics and steroids most recent admission.  Clinically appears to be improved.  She is completing her steroid course.  We will have her come back with close follow-up  Plan  Patient Instructions  Continue on ANORO 1 puff daily  Albuterol inhaler As needed   Labs today .  PT as able.  Continue on Oxygen 2l/m with activity and At bedtime  as needed to keep O2 sats >88-90%.  Home sleep study when available  Follow up with Dr. Silas Flood in 2-3 weeks as planned with chest xray and As needed   Please contact office for sooner follow up if symptoms do not improve or worsen or seek emergency care

## 2022-03-11 DIAGNOSIS — R260 Ataxic gait: Secondary | ICD-10-CM | POA: Diagnosis not present

## 2022-03-11 DIAGNOSIS — R29818 Other symptoms and signs involving the nervous system: Secondary | ICD-10-CM | POA: Diagnosis not present

## 2022-03-12 ENCOUNTER — Inpatient Hospital Stay: Payer: PPO | Admitting: Internal Medicine

## 2022-03-12 DIAGNOSIS — R7989 Other specified abnormal findings of blood chemistry: Secondary | ICD-10-CM | POA: Diagnosis not present

## 2022-03-13 DIAGNOSIS — R260 Ataxic gait: Secondary | ICD-10-CM | POA: Diagnosis not present

## 2022-03-13 DIAGNOSIS — R29818 Other symptoms and signs involving the nervous system: Secondary | ICD-10-CM | POA: Diagnosis not present

## 2022-03-18 ENCOUNTER — Telehealth: Payer: Self-pay | Admitting: Internal Medicine

## 2022-03-18 ENCOUNTER — Ambulatory Visit (HOSPITAL_BASED_OUTPATIENT_CLINIC_OR_DEPARTMENT_OTHER): Payer: PPO | Attending: Internal Medicine | Admitting: Internal Medicine

## 2022-03-18 DIAGNOSIS — R0683 Snoring: Secondary | ICD-10-CM

## 2022-03-18 NOTE — Telephone Encounter (Signed)
Sleep Study Lab called stating that the order that we sent in for the patient to do a at home study, can not be done due to the patient being on oxygen and they can't do at-home studies when patients are on oxygen. They are requesting that a in-lab study order be placed instead.Best callback number is [phone]

## 2022-03-19 DIAGNOSIS — R29818 Other symptoms and signs involving the nervous system: Secondary | ICD-10-CM | POA: Diagnosis not present

## 2022-03-19 DIAGNOSIS — R260 Ataxic gait: Secondary | ICD-10-CM | POA: Diagnosis not present

## 2022-03-19 NOTE — Telephone Encounter (Signed)
She was not on oxygen at the time we ordered this. I see she was just in hospital and started on nocturnal O2. Can discuss at hospital follow up if this is still needed. If needed can place order then.

## 2022-03-21 DIAGNOSIS — M542 Cervicalgia: Secondary | ICD-10-CM | POA: Diagnosis not present

## 2022-03-21 DIAGNOSIS — R29818 Other symptoms and signs involving the nervous system: Secondary | ICD-10-CM | POA: Diagnosis not present

## 2022-03-21 DIAGNOSIS — R260 Ataxic gait: Secondary | ICD-10-CM | POA: Diagnosis not present

## 2022-03-23 ENCOUNTER — Other Ambulatory Visit: Payer: Self-pay | Admitting: Family Medicine

## 2022-03-23 DIAGNOSIS — L237 Allergic contact dermatitis due to plants, except food: Secondary | ICD-10-CM

## 2022-03-25 DIAGNOSIS — M542 Cervicalgia: Secondary | ICD-10-CM | POA: Diagnosis not present

## 2022-03-25 DIAGNOSIS — R260 Ataxic gait: Secondary | ICD-10-CM | POA: Diagnosis not present

## 2022-03-25 DIAGNOSIS — R29818 Other symptoms and signs involving the nervous system: Secondary | ICD-10-CM | POA: Diagnosis not present

## 2022-03-26 ENCOUNTER — Encounter (HOSPITAL_COMMUNITY): Payer: Self-pay | Admitting: Cardiology

## 2022-03-26 ENCOUNTER — Ambulatory Visit (HOSPITAL_COMMUNITY)
Admission: RE | Admit: 2022-03-26 | Discharge: 2022-03-26 | Disposition: A | Discharge status: Home / Self Care | Payer: PPO | Source: Ambulatory Visit | Attending: Cardiology | Admitting: Cardiology

## 2022-03-26 VITALS — BP 94/50 | HR 80 | Wt 127.4 lb

## 2022-03-26 DIAGNOSIS — Z79899 Other long term (current) drug therapy: Secondary | ICD-10-CM | POA: Diagnosis not present

## 2022-03-26 DIAGNOSIS — Z950 Presence of cardiac pacemaker: Secondary | ICD-10-CM | POA: Insufficient documentation

## 2022-03-26 DIAGNOSIS — J8489 Other specified interstitial pulmonary diseases: Secondary | ICD-10-CM | POA: Insufficient documentation

## 2022-03-26 DIAGNOSIS — I110 Hypertensive heart disease with heart failure: Secondary | ICD-10-CM | POA: Diagnosis not present

## 2022-03-26 DIAGNOSIS — I5030 Unspecified diastolic (congestive) heart failure: Secondary | ICD-10-CM | POA: Diagnosis not present

## 2022-03-26 DIAGNOSIS — J449 Chronic obstructive pulmonary disease, unspecified: Secondary | ICD-10-CM | POA: Insufficient documentation

## 2022-03-26 DIAGNOSIS — J439 Emphysema, unspecified: Secondary | ICD-10-CM

## 2022-03-26 DIAGNOSIS — M329 Systemic lupus erythematosus, unspecified: Secondary | ICD-10-CM | POA: Diagnosis not present

## 2022-03-26 DIAGNOSIS — I4891 Unspecified atrial fibrillation: Secondary | ICD-10-CM | POA: Diagnosis not present

## 2022-03-26 DIAGNOSIS — I5032 Chronic diastolic (congestive) heart failure: Secondary | ICD-10-CM

## 2022-03-26 DIAGNOSIS — Z7901 Long term (current) use of anticoagulants: Secondary | ICD-10-CM | POA: Diagnosis not present

## 2022-03-26 DIAGNOSIS — I495 Sick sinus syndrome: Secondary | ICD-10-CM | POA: Insufficient documentation

## 2022-03-26 LAB — HEPATIC FUNCTION PANEL
ALT: 116 U/L — ABNORMAL HIGH (ref 0–44)
AST: 109 U/L — ABNORMAL HIGH (ref 15–41)
Albumin: 3 g/dL — ABNORMAL LOW (ref 3.5–5.0)
Alkaline Phosphatase: 73 U/L (ref 38–126)
Bilirubin, Direct: 0.2 mg/dL (ref 0.0–0.2)
Indirect Bilirubin: 0.4 mg/dL (ref 0.3–0.9)
Total Bilirubin: 0.6 mg/dL (ref 0.3–1.2)
Total Protein: 6.9 g/dL (ref 6.5–8.1)

## 2022-03-26 LAB — BASIC METABOLIC PANEL
Anion gap: 8 (ref 5–15)
BUN: 17 mg/dL (ref 8–23)
CO2: 25 mmol/L (ref 22–32)
Calcium: 8.8 mg/dL — ABNORMAL LOW (ref 8.9–10.3)
Chloride: 100 mmol/L (ref 98–111)
Creatinine, Ser: 1.11 mg/dL — ABNORMAL HIGH (ref 0.44–1.00)
GFR, Estimated: 53 mL/min — ABNORMAL LOW (ref >60)
Glucose, Bld: 77 mg/dL (ref 70–99)
Potassium: 4.2 mmol/L (ref 3.5–5.1)
Sodium: 133 mmol/L — ABNORMAL LOW (ref 135–145)

## 2022-03-26 LAB — BRAIN NATRIURETIC PEPTIDE: B Natriuretic Peptide: 705.3 pg/mL — ABNORMAL HIGH (ref 0.0–100.0)

## 2022-03-26 MED ORDER — METOPROLOL SUCCINATE ER 25 MG PO TB24: 25.0000 mg | ORAL_TABLET | Freq: Every day | ORAL | 3 refills | Status: DC

## 2022-03-26 NOTE — Patient Instructions (Signed)
STOP Metoprolol   START Toprol XL 25 mg daily.  Labs done today, your results will be available in MyChart, we will contact you for abnormal readings.   Your physician recommends that you schedule a follow-up appointment in: 3 months  If you have any questions or concerns before your next appointment please send [place] a message through [place] or call our office at [phone].    TO LEAVE A MESSAGE FOR THE NURSE SELECT OPTION 2, PLEASE LEAVE A MESSAGE INCLUDING: YOUR NAME DATE OF BIRTH CALL BACK NUMBER REASON FOR CALL**this is important as we prioritize the call backs  YOU WILL RECEIVE A CALL BACK THE SAME DAY AS LONG AS YOU CALL BEFORE 4:00 PM  At the [facility], you and your health needs are our priority. As part of our continuing mission to provide you with exceptional heart care, we have created designated Provider Care Teams. These Care Teams include your primary Cardiologist (physician) and Advanced Practice Providers (APPs- Physician Assistants and Nurse Practitioners) who all work together to provide you with the care you need, when you need it.   You may see any of the following providers on your designated Care Team at your next follow up: Dr Glori Bickers Dr Loralie Champagne Dr. Roxana Hires, NP Lyda Jester, [place] [facility] [place], [place] Forestine Na, NP Audry Riles, PharmD   Please be sure to bring in all your medications bottles to every appointment.

## 2022-03-26 NOTE — Progress Notes (Signed)
ADVANCED HEART FAILURE CLINIC NOTE  Referring Physician:Dr. [name] MD Primary Care: Pricilla Holm, MD Primary Cardiologist: Dr. [name] MD  HPI: Sheila Morales is a very pleasant 71 y.o. female with COPD, SLE on methotrexate, hypertension, ILD, HFpEF, SSS s/p PPM who presents for initial visit for further evaluation and treatment of heart failure/cardiomyopathy. Sheila Morales medical history dates back to the 62s when she was originally diagnosed with cutaneous Lupus. She has been on hydroxychloroquine '400mg'$  daily since that time. Her dose was reduced to '200mg'$  daily in 2020 due to concern with hydroxychloroquine availability during COVID. In early 2022, she underwent placement of PPM for symptomatic bradycardia due to sinus node dysfunction.   She was also hospitalized in November 2022 for encephalopathy believed to be due to hyponatremia and E. Coli bacteremia. During that admission she was found to be in new onset atrial fibrillation and acute HFpEF exacerbation requiring BiPAP and ICU admission. Following this she was admitted in January 2023 with fever/confusion/lethargy. During that admission she was noted to be anemic to 7.3 (on eliquis). 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 Has been closely followed by Dr. [name] with continued improvement in her functional status. From a heart failure standpoint, patient's husband believes her symptoms are relatively well controlled. He strongly believes that her current weakness and lack of exercise capacity is due to chronic deconditioning. In addition, she is no longer taking plaquenil.   Interval hx:  Hospitalized in November 23 with PNA and volume overload requiring IV diuretics. Since that time she has done fairly well and returned to baseline functional status. She can walk 30-29f w/o significant difficulty; mostly limited due to deconditioning. LVEF  during admission of 65%-70%.   Activity level/exercise tolerance:  Poor, limited due to deconditioning Orthopnea:  Sleeps on 1-2 pillows Paroxysmal noctural dyspnea:  No Chest pain/pressure:  No/minimal Orthostatic lightheadedness:  No Palpitations:  No Lower extremity edema:  1+ Presyncope/syncope:  No  Past Medical History:  Diagnosis Date   A-fib ([facility]    Acute respiratory failure with hypoxia ([facility] 11/05/2017   Arthritis    CHF (congestive heart failure) (HCC)    Colitis, ischemic (HCC)    COPD (chronic obstructive pulmonary disease) (HCC)    External hemorrhoids    H/O: rheumatic fever    IBS (irritable bowel syndrome)    Pacemaker    Personal history colonic adenoma 03/25/2008   06/2007 right sided adenoma and 2 right hyperplastic polyps     Restless leg syndrome    questionable   Systemic lupus erythematosus (HCC)    Tubular adenoma of colon    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     albuterol (VENTOLIN HFA) 108 (90 Base) MCG/ACT inhaler Inhale 1-2 puffs into the lungs every 6 (six) hours as needed for wheezing or shortness of breath. 8 g 5   amiodarone (PACERONE) 200 MG tablet Take 1 tablet (200 mg total) by mouth daily. 90 tablet 3   apixaban (ELIQUIS) 5 MG TABS tablet Take 1 tablet (5 mg total) by mouth 2 (two) times daily. 180 tablet 0   benzonatate (TESSALON) 200 MG capsule TAKE 1 CAPSULE (200 MG TOTAL) BY MOUTH EVERY 6 (SIX) HOURS AS  NEEDED FOR COUGH. 30 capsule 1   calcium carbonate (OS-CAL - DOSED IN MG OF ELEMENTAL CALCIUM) 1250 (500 Ca) MG tablet Take 1 tablet by mouth every evening.     Cholecalciferol (VITAMIN D) 50 MCG (2000 UT) tablet Take 4,000 Units by mouth every evening.     empagliflozin (JARDIANCE) 10 MG TABS tablet Take 1 tablet (10 mg total) by mouth daily  before breakfast. 30 tablet 5   fluticasone (FLONASE) 50 MCG/ACT nasal spray SPRAY 2 SPRAYS INTO EACH NOSTRIL EVERY DAY 48 mL 3   Fluticasone-Umeclidin-Vilant (TRELEGY ELLIPTA) 100-62.5-25 MCG/ACT AEPB Inhale 1 puff into the lungs daily. 60 each 6   folic acid (FOLVITE) 1 MG tablet Take 1 mg by mouth every evening.     furosemide (LASIX) 20 MG tablet TAKE 1 TABLET (20 MG) BY MOUTH DAILY, TAKE 1 EXTRA TABLET BY MOUTH (20 MG) AS NEEDED FOR INCREASED SWELLING. 90 tablet 3   gabapentin (NEURONTIN) 300 MG capsule Take 1 capsule (300 mg total) by mouth 3 (three) times daily. 630 capsule 0   metoprolol succinate (TOPROL XL) 25 MG 24 hr tablet Take 1 tablet (25 mg total) by mouth daily. 90 tablet 3   Misc Natural Products (ADVANCED JOINT RELIEF) CAPS Take 1 capsule by mouth daily. Joint Advantage Gold 5x     Multiple Vitamin (MULTIVITAMIN WITH MINERALS) TABS tablet Take 1 tablet by mouth daily.     predniSONE (DELTASONE) 20 MG tablet Take 2 tablets (40 mg total) by mouth daily. As needed if bronchitis recurs. 10 tablet 1   RESTASIS 0.05 % ophthalmic emulsion Place 1 drop into both eyes 2 (two) times daily.  6   triamcinolone cream (KENALOG) 0.1 % Apply 1 Application topically as needed.     vitamin C (ASCORBIC ACID) 500 MG tablet Take 500 mg by mouth daily.     vitamin E 180 MG (400 UNITS) capsule Take 400 Units by mouth daily.     zolpidem (AMBIEN) 5 MG tablet TAKE 1 TABLET BY MOUTH EVERY DAY AT BEDTIME AS NEEDED FOR SLEEP 30 tablet 5   methotrexate 2.5 MG tablet Take 10 mg by mouth every Thursday. (Patient not taking: Reported on 03/26/2022)     umeclidinium-vilanterol (ANORO ELLIPTA) 62.5-25 MCG/ACT AEPB Inhale 1 puff into the lungs daily. (Patient not taking: Reported on 03/26/2022)     No current facility-administered medications for this encounter.    No Known Allergies    Social History   Socioeconomic History   Marital status: Married    Spouse name: Not on file   Number of children: 2    Years of education: 12   Highest education level: Not on file  Occupational History   Occupation: cleaning business   Occupation: caregiver  Tobacco Use   Smoking status: Former    Packs/day: 0.25    Years: 30.00    Total pack years: 7.50    Types: Cigarettes    Quit date: 01/21/2020    Years since quitting: 2.1   Smokeless tobacco: Never   Tobacco comments:    smokes "every now and then" 08/24/20 [facility]   Vaping Use   Vaping Use: Never used  Substance and Sexual Activity   Alcohol use: Not Currently    Alcohol/week: 5.0 standard drinks of alcohol    Types: 5 Standard drinks or equivalent per week    Comment: 4 out of 7 dyas per husband at bedside   Drug use: No   Sexual activity: Yes  Other Topics  Concern   Not on file  Social History Narrative   HSG. Married '71. 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       One level home with spouse   Right handed   Caffeine - coffee 2-3 cups am   Exercise - treadmill    Education - 12th grade   Social Determinants of Health   Financial Resource Strain: Low Risk  (09/21/2021)   Overall Financial Resource Strain (CARDIA)    Difficulty of Paying Living Expenses: Not hard at all  Food Insecurity: No Food Insecurity (02/23/2022)   Hunger Vital Sign    Worried About Running Out of Food in the Last Year: Never true    Ran Out of Food in the Last Year: Never true  Transportation Needs: No Transportation Needs (02/23/2022)   PRAPARE - Hydrologist (Medical): No    Lack of Transportation (Non-Medical): No  Physical Activity: Inactive (09/21/2021)   Exercise Vital Sign    Days of Exercise per Week: 0 days    Minutes of Exercise per Session: 0 min  Stress: No Stress Concern Present (09/21/2021)   [place]    Feeling of Stress : Not at all  Social Connections:  Moderately Integrated (09/21/2021)   Social Connection and Isolation Panel [NHANES]    Frequency of Communication with Friends and Family: Three times a week    Frequency of Social Gatherings with Friends and Family: Three times a week    Attends Religious Services: 1 to 4 times per year    Active Member of Clubs or Organizations: No    Attends Archivist Meetings: Never    Marital Status: Married  Human resources officer Violence: Not At Risk (02/23/2022)   Humiliation, Afraid, Rape, and Kick questionnaire    Fear of Current or Ex-Partner: No    Emotionally Abused: No    Physically Abused: No    Sexually Abused: No      Family History  Problem Relation Age of Onset   Stroke Mother        Deceased, 49s   Atrial fibrillation Father    Colon cancer Father    Dementia Sister    Other Sister        Pick's disease, deceased   Healthy Daughter     Vitals:   03/26/22 0906  BP: (!) 94/50  Pulse: 80  SpO2: 96%  Weight: 57.8 kg (127 lb 6.4 oz)    PHYSICAL EXAM: GENERAL: Elderly frail WF; NAD NECK: Supple, No masses. Normal carotid upstrokes without bruits. No masses or thyromegaly.    CHEST: There are no chest wall deformities. There is no chest wall tenderness. Respirations are unlabored.  Lungs- coarse lung sounds; bibasilar inspiratory crackles CARDIAC:  JVP: 9cm         NS1S2 with +S3, Normal rate with regular rhythm. No murmurs, rubs or gallops.  Pulses are 2+ and symmetrical in upper and lower extremities. No edema.  ABDOMEN: Soft, non-tender, non-distended. There are no masses or hepatomegaly. There are normal bowel sounds.  EXTREMITIES: warm; well perfused 1+ edema LYMPHATIC: No axillary or supraclavicular lymphadenopathy.  NEUROLOGIC: AAOX3, no FND PSYCH: Patients affect is appropriate, there is no evidence of anxiety or depression.  SKIN: Warm and dry; no lesions or wounds.   DATA REVIEW  ECG: Atrial paced, V sensed at 70BPM as per my  read  ECHO: 03/22/21: LVEF  60-65%, LVH; Grade III diastolic dysfunction; normal RV function as per my read.  01/10/22: LVEF 65%, normal RV function.   CMR:  09/26/2021: 1. Normal biventricular chamber size and function, LVEF 57%, RVEF 58%.  2. There is post contrast delayed myocardial enhancement. Midmyocardial circumferential stripe of delayed enhancement at the base. Midmyocardial delayed enhancement in the lateral wall at the mid ventricle. Findings are nonspecific. Overall, findings may represent hypertensive heart disease. 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  ASSESSMENT & PLAN:  Heart failure with preserved ejection fraction, NYHA III On TTE she appears to be approaching grade III diastolic dysfunction. 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 I had a lengthy discussion with the patient and her husband regarding this diagnosis. Her risk factors include prolonged use of high dose hydroxychloroquine (>20 years of '400mg'$  at least), conduction system pathology and imaging that may suggestive of it. Since she has stopped taking plaquenil, the family agrees that invasive testing (myocardial biopsy) would be unnecessary.  - Recently hospitalized with pneumonia, became volume overloaded during the hospitalization. Repeat TTE w/ normal LVEF.  - Crackles on exam today, continue lasix '40mg'$  x 3 days.  - Plan to start spironolactone 12.'5mg'$  today pending labs - Continue jardiance.  2. Symptomatic bradycardia/SSS s/p dcPPM - Interrogation from 8/23 w/ 95% atrial pacing-v sense with no episodes of atrial fibrillation during this period.   3. Atrial fibrillation  - Amiodarone '200mg'$  daily  - Metoprolol '25mg'$  BID - Eliquis '5mg'$  BID  3. COPD / NSIP - well controlled at this point; off prednisone  4. Cutaneous lupus - Plaquenil  discontinued in mid to early 2023 - Methotrexate   Follow-up:  3 months  Rebecca Motta [facility]

## 2022-03-26 NOTE — Progress Notes (Signed)
ReDS Vest / Clip - 03/26/22 1000       ReDS Vest / Clip   Station Marker A    Ruler Value 27    ReDS Value Range Low volume    ReDS Actual Value 27

## 2022-03-26 NOTE — Progress Notes (Signed)
Remote pacemaker transmission.   

## 2022-03-27 ENCOUNTER — Ambulatory Visit (INDEPENDENT_AMBULATORY_CARE_PROVIDER_SITE_OTHER): Payer: PPO

## 2022-03-27 ENCOUNTER — Ambulatory Visit (INDEPENDENT_AMBULATORY_CARE_PROVIDER_SITE_OTHER): Payer: PPO | Admitting: Pulmonary Disease

## 2022-03-27 ENCOUNTER — Encounter: Payer: Self-pay | Admitting: Pulmonary Disease

## 2022-03-27 VITALS — BP 130/74 | HR 74 | Wt 129.0 lb

## 2022-03-27 DIAGNOSIS — J849 Interstitial pulmonary disease, unspecified: Secondary | ICD-10-CM

## 2022-03-27 DIAGNOSIS — R918 Other nonspecific abnormal finding of lung field: Secondary | ICD-10-CM | POA: Diagnosis not present

## 2022-03-27 MED ORDER — ANORO ELLIPTA 62.5-25 MCG/ACT IN AEPB: 1.0000 | INHALATION_SPRAY | Freq: Every day | RESPIRATORY_TRACT | 0 refills | Status: DC

## 2022-03-27 NOTE — Progress Notes (Signed)
$'@Patient'p$  ID: Sheila Morales, female    DOB: 04/04/1951, 71 y.o.   MRN: [id number]  Chief Complaint  Patient presents with   Follow-up    Pt is here for her 3 month follow up. She saw TP last month for a HFU. Pt states she would like to go back to Anoro and stop the Trelegy. She states she did not get much or any relief with the Trelegy. Pt would also like to get a new chest xray. Pt has ILD and states she is doing ok. Pt is on prednisone as needed     Referring provider: Hoyt Koch, *  HPI:   71 y.o. 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  Most recent cardiology note reviewed.  Recent pulmonary note via NP reviewed.  Discharge summary 01/2022 reviewed.Marland Kitchen  Hospitalized 01/2022.  BNP elevated.  She improved with IV diuresis.  Was also given some steroids.  Was on oxygen.  Then subsequently tapered off oxygen.  Chest x-ray that admission on my review interpretation shows diffuse hazy alveolar filling process as well as interlobular septal thickening concerning for pulmonary edema.  Also with what appears to be more prominent peripheral left lower the midlung field opacity in right upper lung field opacity.  Today she feels okay.  She is finishing up a couple of days of extra Lasix given JVP on recent exam and concern for volume overload.  She is on the prednisone.  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  HPI at initial visit: Briefly, history of infiltrates in the past with area of posterior right upper lobe scarring.  This predated her amiodarone.  Also on methotrexate.  However, was hospitalized in late 2022 with volume  overload and groundglass opacities favored to be most likely related to pulmonary edema.  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  She was placed on prednisone taper at discharge.  At the end of her prednisone, she developed dyspnea.  This worsened over the course of a few days.  Developed fever, came to hospital.  Was found to be hypoxemic.  Coarse interstitial infiltrates on chest x-ray on my review and interpretation.  CT chest showed worsening area of fibrotic changes in the posterior right upper lobe as well as scattered groundglass.  At time of evaluation he is on 5 L nasal cannula with accessory muscle use.  Felt too ill to safely tolerate bronchoscopy.  Recommend ongoing diuresis but started prednisone half a milligram per kilogram, 40 mg daily.  After 1 dose her fever subsided, her hypoxemia resolved, her dyspnea exertion went away.  She had peripheral eosinophilia 400-500.  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   Questionaires / Pulmonary Flowsheets:   ACT:      No data to display           MMRC:     No data to display           Epworth:      No data to display  Tests:   FENO:  No results found for: "NITRICOXIDE"  PFT:    Latest Ref Rng & Units 02/06/2017    3:09 PM  PFT Results  FVC-Pre L 2.14   FVC-Predicted Pre % 72   FVC-Post L 2.22   FVC-Predicted Post % 74   Pre FEV1/FVC % % 60   Post FEV1/FCV % % 59   FEV1-Pre L 1.28   FEV1-Predicted Pre % 56   FEV1-Post L 1.30   DLCO uncorrected ml/min/mmHg 12.45   DLCO UNC% % 54   DLCO corrected ml/min/mmHg 12.34   DLCO COR %Predicted % 53   DLVA Predicted % 66   TLC L 4.65   TLC % Predicted % 94   RV % Predicted % 107   Personally reviewed and interpreted 2018 PFTs where spirometry is suggestive of moderate  obstruction, no bronchodilator response, TLC within normal limits, DLCO moderately reduced  WALK:      No data to display           Imaging: Personally reviewed and as per EMR discussed in this note CT Angio Chest Pulmonary Embolism (PE) W or WO Contrast  Result Date: 02/27/2022 CLINICAL DATA:  Pulmonary embolism (PE) suspected, positive D-dimer EXAM: CT ANGIOGRAPHY CHEST WITH CONTRAST TECHNIQUE: Multidetector CT imaging of the chest was performed using the standard protocol during bolus administration of intravenous contrast. Multiplanar CT image reconstructions and MIPs were obtained to evaluate the vascular anatomy. RADIATION DOSE REDUCTION: This exam was performed according to the departmental dose-optimization program which includes automated exposure control, adjustment of the mA and/or kV according to patient size and/or use of iterative reconstruction technique. CONTRAST:  63m OMNIPAQUE IOHEXOL 350 MG/ML SOLN COMPARISON:  Radiograph earlier today. Chest CT 04/24/2021, portions from PET CT 08/28/2021 FINDINGS: Cardiovascular: Allowing for motion artifact, there is no pulmonary embolus. Motion obscures detailed assessment of the lung bases particularly lower lobes. Mild dilatation of the main pulmonary artery at 3.4 cm. Left-sided pacemaker with intact leads. Mild multi chamber cardiomegaly. There is contrast refluxing into the hepatic veins and IVC. No pericardial effusion. No thoracic aortic aneurysm or dissection, moderate atherosclerosis. There are coronary artery calcifications. Mediastinum/Nodes: 13 mm subcarinal node series 4, image 48. This previously measured 10 mm. The previous prominent mediastinal lymph nodes have likely improved, but are partially obscured by dense artifact from IV contrast in the SVC. There is high-density material within the distal esophagus and proximal stomach suggesting ingested material, possible reflux or delayed transit. Lungs/Pleura: Ground-glass opacities  within both lungs, most prominent involving the right upper lobe, but multifocal involvement. There is progression from prior chest CT. Superimposed septal thickening primarily in the upper lobes. Small right pleural effusion. There is a small amount of fluid in the left inter lobar fissure. There is mild emphysema. No dominant pulmonary mass. Upper Abdomen: Contrast refluxing into the hepatic veins and IVC. Small amount of high-density ingested material in the stomach. Musculoskeletal: There are no acute or suspicious osseous abnormalities. Review of the MIP images confirms the above findings. IMPRESSION: 1. Allowing for motion artifact, no pulmonary embolus. 2. Ground-glass opacities within both lungs, most prominent involving the right upper lobe. Opacities have been present on prior exams, but progressed currently. Waxing and waning atypical infection, interstitial lung disease or less likely asymmetric pulmonary edema are considered. 3. Small right pleural effusion. Mild multi chamber cardiomegaly. Contrast refluxing into the hepatic veins and IVC consistent with elevated right heart pressures. 4. Mild dilatation of the main pulmonary artery suggesting  pulmonary arterial hypertension. 5. High-density material within the distal esophagus and proximal stomach suggesting ingested material, possible reflux or delayed transit. Aortic Atherosclerosis (ICD10-I70.0) and Emphysema (ICD10-J43.9). Electronically Signed   By: Keith Rake M.D.   On: 02/27/2022 19:00   CUP PACEART REMOTE DEVICE CHECK  Result Date: 02/26/2022 Scheduled remote reviewed. Normal device function.  Next remote 91 days. LA   Lab Results: Personally reviewed CBC    Component Value Date/Time   WBC 10.0 03/02/2022 0306   RBC 4.02 03/02/2022 0306   HGB 12.4 03/02/2022 0306   HGB 11.9 05/22/2021 0842   HGB 13.1 01/13/2009 1108   HCT 38.1 03/02/2022 0306   HCT 35.3 05/22/2021 0842   HCT 37.5 01/13/2009 1108   PLT 338 03/02/2022  0306   PLT 194 05/22/2021 0842   MCV 94.8 03/02/2022 0306   MCV 92 05/22/2021 0842   MCV 86.2 01/13/2009 1108   MCH 30.8 03/02/2022 0306   MCHC 32.5 03/02/2022 0306   RDW 15.9 (H) 03/02/2022 0306   RDW 14.5 05/22/2021 0842   RDW 12.2 01/13/2009 1108   LYMPHSABS 1.0 03/02/2022 0306   LYMPHSABS 1.2 05/11/2020 1203   LYMPHSABS 2.2 01/13/2009 1108   MONOABS 1.0 03/02/2022 0306   MONOABS 0.7 01/13/2009 1108   EOSABS 0.0 03/02/2022 0306   EOSABS 0.1 05/11/2020 1203   BASOSABS 0.0 03/02/2022 0306   BASOSABS 0.0 05/11/2020 1203   BASOSABS 0.0 01/13/2009 1108    BMET    Component Value Date/Time   NA 133 (L) 03/26/2022 0939   NA 136 08/13/2021 1116   K 4.2 03/26/2022 0939   CL 100 03/26/2022 0939   CO2 25 03/26/2022 0939   GLUCOSE 77 03/26/2022 0939   BUN 17 03/26/2022 0939   BUN 17 08/13/2021 1116   CREATININE 1.11 (H) 03/26/2022 0939   CREATININE 0.91 11/22/2019 1502   CALCIUM 8.8 (L) 03/26/2022 0939   GFRNONAA 53 (L) 03/26/2022 0939   GFRAA 89 05/11/2020 1203    BNP    Component Value Date/Time   BNP 705.3 (H) 03/26/2022 0951    ProBNP    Component Value Date/Time   PROBNP 1,033.0 (H) 07/09/2021 1242    Specialty Problems       Pulmonary Problems   COPD (chronic obstructive pulmonary disease) ([place])   Interstitial lung disease (HCC)   Snoring    No Known Allergies  Immunization History  Administered Date(s) Administered   Fluad Quad(high Dose 65+) 03/29/2019   H1N1 05/31/2008   Influenza Split 02/13/2012   Influenza Whole 01/21/2008, 01/17/2009   Influenza, High Dose Seasonal PF 02/19/2016, 02/05/2018   Influenza,inj,Quad PF,6+ Mos 01/21/2014   Influenza-Unspecified 02/02/2015, 02/03/2017   Pneumococcal Conjugate-13 02/19/2016   Pneumococcal Polysaccharide-23 02/24/2017   Tdap 03/29/2019    Past Medical History:  Diagnosis Date   A-fib [facility])    Acute respiratory failure with hypoxia ([place]) 11/05/2017   Arthritis    CHF (congestive heart  failure) (HCC)    Colitis, ischemic (HCC)    COPD (chronic obstructive pulmonary disease) ([place])    External hemorrhoids    H/O: rheumatic fever    IBS (irritable bowel syndrome)    Pacemaker    Personal history colonic adenoma 03/25/2008   06/2007 right sided adenoma and 2 right hyperplastic polyps     Restless leg syndrome    questionable   Systemic lupus erythematosus (HCC)    Tubular adenoma of colon    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   albuterol (VENTOLIN HFA) 108 (90 Base) MCG/ACT inhaler Inhale 1-2 puffs into the lungs every 6 (six) hours as needed for wheezing or shortness of breath.   amiodarone (PACERONE) 200 MG tablet Take 1 tablet (200 mg total) by mouth daily.   apixaban (ELIQUIS) 5 MG TABS tablet Take 1 tablet (5 mg total) by mouth 2 (two) times daily.   benzonatate (TESSALON) 200 MG capsule TAKE 1 CAPSULE (200 MG TOTAL) BY MOUTH EVERY 6 (SIX) HOURS AS NEEDED FOR COUGH.   calcium carbonate (OS-CAL - DOSED IN MG OF ELEMENTAL CALCIUM) 1250 (500 Ca) MG tablet Take 1 tablet by mouth every evening.   Cholecalciferol (VITAMIN D) 50 MCG (2000 UT) tablet Take 4,000 Units by mouth every evening.   empagliflozin (JARDIANCE) 10 MG TABS tablet Take 1 tablet (10 mg total) by mouth daily before breakfast.   fluticasone (FLONASE) 50 MCG/ACT nasal spray SPRAY 2 SPRAYS INTO EACH NOSTRIL  EVERY DAY   folic acid (FOLVITE) 1 MG tablet Take 1 mg by mouth every evening.   furosemide (LASIX) 20 MG tablet TAKE 1 TABLET (20 MG) BY MOUTH DAILY, TAKE 1 EXTRA TABLET BY MOUTH (20 MG) AS NEEDED FOR INCREASED SWELLING.   gabapentin (NEURONTIN) 300 MG capsule Take 1 capsule (300 mg total) by mouth 3 (three) times daily.   methotrexate 2.5 MG tablet Take 10 mg by mouth every Thursday.   metoprolol succinate (TOPROL XL) 25 MG 24 hr tablet Take 1 tablet (25 mg total) by mouth daily.   Misc Natural Products (ADVANCED JOINT RELIEF) CAPS Take 1 capsule by mouth daily. Joint Advantage Gold 5x   Multiple Vitamin (MULTIVITAMIN WITH MINERALS) TABS tablet Take 1 tablet by mouth daily.   predniSONE (DELTASONE) 20 MG tablet Take 2 tablets (40 mg total) by mouth daily. As needed if bronchitis recurs.   RESTASIS 0.05 % ophthalmic emulsion Place 1 drop into both eyes 2 (two) times daily.   triamcinolone cream (KENALOG) 0.1 % Apply 1 Application topically as needed.   vitamin C (ASCORBIC ACID) 500 MG tablet Take 500 mg by mouth daily.   vitamin E 180 MG (400 UNITS) capsule Take 400 Units by mouth daily.   zolpidem (AMBIEN) 5 MG tablet TAKE 1 TABLET BY MOUTH EVERY DAY AT BEDTIME AS NEEDED FOR SLEEP   [DISCONTINUED] umeclidinium-vilanterol (ANORO ELLIPTA) 62.5-25 MCG/ACT AEPB Inhale 1 puff into the lungs daily.   umeclidinium-vilanterol (ANORO ELLIPTA) 62.5-25 MCG/ACT AEPB Inhale 1 puff into the lungs daily.   [DISCONTINUED] Fluticasone-Umeclidin-Vilant (TRELEGY ELLIPTA) 100-62.5-25 MCG/ACT AEPB Inhale 1 puff into the lungs daily. (Patient not taking: Reported on 03/27/2022)   No facility-administered encounter medications on file as of 03/27/2022.     Review of Systems  Review of Systems  N/a Physical Exam  BP 130/74 (BP Location: Left Arm, Patient Position: Sitting, Cuff Size: Normal)  Pulse 74   Wt 129 lb (58.5 kg)   SpO2 92%   BMI 23.59 kg/m   Wt Readings from Last 5 Encounters:  03/27/22 129  lb (58.5 kg)  03/26/22 127 lb 6.4 oz (57.8 kg)  03/07/22 125 lb 9.6 oz (57 kg)  02/27/22 133 lb 13.1 oz (60.7 kg)  01/04/22 131 lb 6.4 oz (59.6 kg)    BMI Readings from Last 5 Encounters:  03/27/22 23.59 kg/m  03/26/22 23.30 kg/m  03/07/22 22.97 kg/m  02/27/22 24.48 kg/m  01/04/22 24.03 kg/m     Physical Exam General: Sitting in chair, appears drowsy Pulmonary: Mild crackle left base, otherwise clear, good air movement Cardiovascular: Regular in rhythm Neuro: Normal gait, no focal weakness on exam   Assessment & Plan:   Acute hypoxemic respiratory failure with progressive chronic pulmonary infiltrates with worsened fever and cough: Concerning for underlying inflammatory lung disease, favor NSIP given pattern and groundglass opacities on imaging on admission.  Suspect prior infiltrates 03/2021 reflective of this process as well as volume overload given bilateral pleural effusions now improved.  Small area of what looks like fibrosis in the right upper lobe in 03/21/2021.  Overall pattern improved from last admission but with progressive fibrosis particular in the right upper lobe as well as bronchiectasis and bilateral but right greater than left lower lobes.  No clear signs of pulmonary infection on CT scan on my interpretation.  Fear this is early fibrosis and signs of volume loss.  She had fever, productive cough worsening hypoxemia in the setting of steroid taper.  Procalcitonin was negative.  The differential also includes methotrexate lung toxicity although the pattern does not appear classic for this.  In addition, amiodarone toxicity is possible but given initial area of fibrosis predates amiodarone and the relative short duration of amiodarone administration this is felt unlikely.  She had peripheral eosinophilia of 400 on admission.  Marked improvement and essentially resolution of fever, hypoxemia, dyspnea, cough after 1 dose of prednisone 40 mg daily.  This raises high  clinical suspicion of chronic eosinophilic pneumonia.  Gradual improvement in symptoms with prednisone taper, chest x-ray improving 05/12/2021.  Breathing suddenly worsen later early spring 2023 felt to be largely related to volume overload given elevated BNP and improvement with diuresis.  Subsequent hospitalization 01/2022 again in the setting of volume overload.  Interstitial lung disease: Upper lobe dominant fibrotic changes, groundglass in the past.  Suspicion for NSIP.  Discussed role and rationale for antifibrotic's additional work-up for possible further immunosuppression.  At this time, she wants to focus on getting her fluid weight down.  We discussed the risk and benefits of antifibrotic therapy and she states she is not interested pursuing this at this time. Can reevaluate in the coming months.  Moderate severe COPD: Based on PTs 2018.  With recurrent exacerbations, bronchitis requiring prednisone recently.  Escalated Anoro to Trelegy for triple inhaled therapy given recurrent exacerbations.  No improvement in day-to-day symptoms.  Return to [facility], new prescription today.  Return in about 3 months (around 06/26/2022).   Lanier Clam, MD 03/27/2022  I spent 42 minutes in the care of the patient including face-to-face visit, review of records, coordination of care.

## 2022-03-27 NOTE — Patient Instructions (Signed)
Nice to see you again  I am glad you are feeling better since being in the hospital  I sent a prescription for Norco, sent a message in a new year and we will try a longer supply  I removed Trelegy from your medication list  We will repeat a chest x-ray today  We can discuss additional medicines in the future for scarring or fibrosis of the lung.  In my opinion, extra fluid or fluid buildup has been more a problem the last few months, okay waiting to discuss this again in the future.  Return to clinic in 3 months or sooner as needed with Dr. Silas Flood

## 2022-03-28 DIAGNOSIS — R260 Ataxic gait: Secondary | ICD-10-CM | POA: Diagnosis not present

## 2022-03-28 DIAGNOSIS — M542 Cervicalgia: Secondary | ICD-10-CM | POA: Diagnosis not present

## 2022-03-28 DIAGNOSIS — R29818 Other symptoms and signs involving the nervous system: Secondary | ICD-10-CM | POA: Diagnosis not present

## 2022-03-28 NOTE — Progress Notes (Signed)
CXR looks improved compared to when in hospital

## 2022-03-29 ENCOUNTER — Ambulatory Visit: Payer: PPO | Admitting: Neurology

## 2022-04-01 DIAGNOSIS — M542 Cervicalgia: Secondary | ICD-10-CM | POA: Diagnosis not present

## 2022-04-01 DIAGNOSIS — R29818 Other symptoms and signs involving the nervous system: Secondary | ICD-10-CM | POA: Diagnosis not present

## 2022-04-01 DIAGNOSIS — R260 Ataxic gait: Secondary | ICD-10-CM | POA: Diagnosis not present

## 2022-04-02 ENCOUNTER — Ambulatory Visit: Payer: PPO | Admitting: Internal Medicine

## 2022-04-02 DIAGNOSIS — R7989 Other specified abnormal findings of blood chemistry: Secondary | ICD-10-CM | POA: Diagnosis not present

## 2022-04-04 ENCOUNTER — Other Ambulatory Visit: Payer: Self-pay | Admitting: Internal Medicine

## 2022-04-04 DIAGNOSIS — J849 Interstitial pulmonary disease, unspecified: Secondary | ICD-10-CM | POA: Diagnosis not present

## 2022-04-04 DIAGNOSIS — U071 COVID-19: Secondary | ICD-10-CM | POA: Diagnosis not present

## 2022-04-04 DIAGNOSIS — M542 Cervicalgia: Secondary | ICD-10-CM | POA: Diagnosis not present

## 2022-04-04 DIAGNOSIS — R29818 Other symptoms and signs involving the nervous system: Secondary | ICD-10-CM | POA: Diagnosis not present

## 2022-04-04 DIAGNOSIS — R260 Ataxic gait: Secondary | ICD-10-CM | POA: Diagnosis not present

## 2022-04-05 ENCOUNTER — Ambulatory Visit: Payer: PPO | Admitting: Neurology

## 2022-04-08 ENCOUNTER — Other Ambulatory Visit: Payer: Self-pay | Admitting: Rheumatology

## 2022-04-08 DIAGNOSIS — R29818 Other symptoms and signs involving the nervous system: Secondary | ICD-10-CM | POA: Diagnosis not present

## 2022-04-08 DIAGNOSIS — R7989 Other specified abnormal findings of blood chemistry: Secondary | ICD-10-CM

## 2022-04-08 DIAGNOSIS — M542 Cervicalgia: Secondary | ICD-10-CM | POA: Diagnosis not present

## 2022-04-08 DIAGNOSIS — R260 Ataxic gait: Secondary | ICD-10-CM | POA: Diagnosis not present

## 2022-04-10 DIAGNOSIS — I5030 Unspecified diastolic (congestive) heart failure: Secondary | ICD-10-CM | POA: Diagnosis not present

## 2022-04-10 DIAGNOSIS — N1831 Chronic kidney disease, stage 3a: Secondary | ICD-10-CM | POA: Diagnosis not present

## 2022-04-10 DIAGNOSIS — I129 Hypertensive chronic kidney disease with stage 1 through stage 4 chronic kidney disease, or unspecified chronic kidney disease: Secondary | ICD-10-CM | POA: Diagnosis not present

## 2022-04-10 DIAGNOSIS — J449 Chronic obstructive pulmonary disease, unspecified: Secondary | ICD-10-CM | POA: Diagnosis not present

## 2022-04-10 DIAGNOSIS — I480 Paroxysmal atrial fibrillation: Secondary | ICD-10-CM | POA: Diagnosis not present

## 2022-04-10 DIAGNOSIS — E871 Hypo-osmolality and hyponatremia: Secondary | ICD-10-CM | POA: Diagnosis not present

## 2022-04-11 DIAGNOSIS — M542 Cervicalgia: Secondary | ICD-10-CM | POA: Diagnosis not present

## 2022-04-11 DIAGNOSIS — R260 Ataxic gait: Secondary | ICD-10-CM | POA: Diagnosis not present

## 2022-04-11 DIAGNOSIS — R29818 Other symptoms and signs involving the nervous system: Secondary | ICD-10-CM | POA: Diagnosis not present

## 2022-04-12 ENCOUNTER — Telehealth: Payer: Self-pay | Admitting: Pulmonary Disease

## 2022-04-12 MED ORDER — PREDNISONE 20 MG PO TABS: 20.0000 mg | ORAL_TABLET | Freq: Every day | ORAL | 0 refills | Status: AC

## 2022-04-12 MED ORDER — PREDNISONE 10 MG PO TABS: 20.0000 mg | ORAL_TABLET | Freq: Every day | ORAL | 0 refills | Status: DC

## 2022-04-12 NOTE — Telephone Encounter (Signed)
Called patient and she states that she is having a lot of phlegm buildup. She states she notices it a lot when she is eating.  She states she is able to cough some of it up when she is eating and it is clear almost white sometimes. She is asking for something to be called in.  Would you want something to thin her secretions?   Please advise sir

## 2022-04-12 NOTE — Telephone Encounter (Signed)
Prednisone 20 mg  for 5 days sent to her pharmacy.  Okay to continue Flonase as she is currently using.

## 2022-04-12 NOTE — Telephone Encounter (Signed)
Does she have nasal or sinus congestion? Trying to figure out if phlegm is coming from above and dripping down or coming from chest.

## 2022-04-12 NOTE — Telephone Encounter (Signed)
Called and spoke to patient about the medication Dr Silas Flood wants to send in. Prednisone '20mg'$  for 5 days. I told her if after she finishes the prednisone if she is still feeling the mucus building up to call the office and ill get her scheduled for an office visit with [place]. She verbalized understanding. Nothing further needed

## 2022-04-12 NOTE — Telephone Encounter (Signed)
Called and spoke to patient and she states she feels like she has a little bit of sinus congestion. But she states she has been using the Flonase to help.   She has been using Flonase 2 sprays each nostril 2 times a day. (Isn't this too much sir?  But she states she feels like it is more in her chest then anything.  Please advise sir

## 2022-04-12 NOTE — Telephone Encounter (Signed)
PT calling back again about Phlegm issue. Please call back @ [phone]. Thank you.

## 2022-04-16 DIAGNOSIS — R29818 Other symptoms and signs involving the nervous system: Secondary | ICD-10-CM | POA: Diagnosis not present

## 2022-04-16 DIAGNOSIS — M542 Cervicalgia: Secondary | ICD-10-CM | POA: Diagnosis not present

## 2022-04-16 DIAGNOSIS — R260 Ataxic gait: Secondary | ICD-10-CM | POA: Diagnosis not present

## 2022-04-17 LAB — BASIC METABOLIC PANEL
BUN: 19 (ref 4–21)
Creatinine: 1.2 — AB (ref 0.5–1.1)
Glucose: 87
Potassium: 4.3 mEq/L (ref 3.5–5.1)
Sodium: 135 — AB (ref 137–147)

## 2022-04-17 LAB — COMPREHENSIVE METABOLIC PANEL
Albumin: 3.7 (ref 3.5–5.0)
Calcium: 8.9 (ref 8.7–10.7)
eGFR: 46

## 2022-04-18 DIAGNOSIS — R260 Ataxic gait: Secondary | ICD-10-CM | POA: Diagnosis not present

## 2022-04-18 DIAGNOSIS — R29818 Other symptoms and signs involving the nervous system: Secondary | ICD-10-CM | POA: Diagnosis not present

## 2022-04-18 DIAGNOSIS — M542 Cervicalgia: Secondary | ICD-10-CM | POA: Diagnosis not present

## 2022-04-19 ENCOUNTER — Telehealth: Payer: Self-pay

## 2022-04-19 NOTE — Telephone Encounter (Signed)
Sheila Munroe, MD  [name], RN; [name], MD Would consider holding amiodarone with elevated LFT, and follow up or review with EP. [place]         Called and spoke with patient and patients husband who states that patient was also on Methotrexate that was stopped by her Rheumatologist around a month ago. Advised them of the above from Dr. [name] and recommendation to stop the Amiodarone and to follow up with EP as well. Advised them I will forward message to EP Scheduler to get scheduled. Patient is scheduled for liver ultrasound as well. Advised patient and patients husband to call back with any issues, questions, or concerns, patient and patients husband verbalized understanding.

## 2022-04-19 NOTE — Telephone Encounter (Signed)
Received blood work via fax from Rheumatology- report showed ALT- 104 and AST 103- was also previously elevated on prior labs listed in chart. Will route this to Dr. [name] as well to make her aware. Per rheumatology check with cardio in case other medications could cause. Per this fax patient being sent for Liver US. Will forward to PharmD to review medications.

## 2022-04-19 NOTE — Telephone Encounter (Signed)
If medication induced, the most likely culprit would be her amiodarone

## 2022-04-23 DIAGNOSIS — R29818 Other symptoms and signs involving the nervous system: Secondary | ICD-10-CM | POA: Diagnosis not present

## 2022-04-23 DIAGNOSIS — R260 Ataxic gait: Secondary | ICD-10-CM | POA: Diagnosis not present

## 2022-04-23 DIAGNOSIS — M542 Cervicalgia: Secondary | ICD-10-CM | POA: Diagnosis not present

## 2022-04-26 DIAGNOSIS — M542 Cervicalgia: Secondary | ICD-10-CM | POA: Diagnosis not present

## 2022-04-26 DIAGNOSIS — R29818 Other symptoms and signs involving the nervous system: Secondary | ICD-10-CM | POA: Diagnosis not present

## 2022-04-26 DIAGNOSIS — R260 Ataxic gait: Secondary | ICD-10-CM | POA: Diagnosis not present

## 2022-04-29 NOTE — Telephone Encounter (Signed)
Patient called back asking for an update on the sleep study, as she has not been contacted about rescheduling or what next steps should be.

## 2022-04-30 ENCOUNTER — Ambulatory Visit
Admission: RE | Admit: 2022-04-30 | Discharge: 2022-04-30 | Disposition: A | Discharge status: Home / Self Care | Payer: PPO | Source: Ambulatory Visit | Attending: Rheumatology | Admitting: Rheumatology

## 2022-04-30 DIAGNOSIS — R29818 Other symptoms and signs involving the nervous system: Secondary | ICD-10-CM | POA: Diagnosis not present

## 2022-04-30 DIAGNOSIS — R7989 Other specified abnormal findings of blood chemistry: Secondary | ICD-10-CM | POA: Diagnosis not present

## 2022-04-30 DIAGNOSIS — R260 Ataxic gait: Secondary | ICD-10-CM | POA: Diagnosis not present

## 2022-04-30 DIAGNOSIS — M542 Cervicalgia: Secondary | ICD-10-CM | POA: Diagnosis not present

## 2022-04-30 NOTE — Telephone Encounter (Signed)
I do not see that she ever came for hospital follow up. See my note below that we would discuss if needed at that visit. If she does not want to come she can call her pulmonary doctor to see if they will order.

## 2022-05-01 NOTE — Telephone Encounter (Signed)
Patient states that she is going to hold off due to her being very busy and also stated that she is okay as of now but was informed if anything was to come up pleas reach out to [place].

## 2022-05-03 DIAGNOSIS — R29818 Other symptoms and signs involving the nervous system: Secondary | ICD-10-CM | POA: Diagnosis not present

## 2022-05-03 DIAGNOSIS — M542 Cervicalgia: Secondary | ICD-10-CM | POA: Diagnosis not present

## 2022-05-03 DIAGNOSIS — R260 Ataxic gait: Secondary | ICD-10-CM | POA: Diagnosis not present

## 2022-05-07 ENCOUNTER — Other Ambulatory Visit: Payer: Self-pay | Admitting: Cardiovascular Disease

## 2022-05-07 DIAGNOSIS — R29818 Other symptoms and signs involving the nervous system: Secondary | ICD-10-CM | POA: Diagnosis not present

## 2022-05-07 DIAGNOSIS — R260 Ataxic gait: Secondary | ICD-10-CM | POA: Diagnosis not present

## 2022-05-07 DIAGNOSIS — M542 Cervicalgia: Secondary | ICD-10-CM | POA: Diagnosis not present

## 2022-05-08 ENCOUNTER — Ambulatory Visit: Payer: PPO | Admitting: Neurology

## 2022-05-10 ENCOUNTER — Encounter: Payer: Self-pay | Admitting: Internal Medicine

## 2022-05-10 ENCOUNTER — Other Ambulatory Visit: Payer: Self-pay | Admitting: Nurse Practitioner

## 2022-05-10 ENCOUNTER — Ambulatory Visit: Payer: PPO | Attending: Internal Medicine | Admitting: Internal Medicine

## 2022-05-10 VITALS — BP 108/66 | HR 77 | Ht 62.0 in | Wt 126.8 lb

## 2022-05-10 DIAGNOSIS — I10 Essential (primary) hypertension: Secondary | ICD-10-CM | POA: Diagnosis not present

## 2022-05-10 DIAGNOSIS — Z950 Presence of cardiac pacemaker: Secondary | ICD-10-CM | POA: Diagnosis not present

## 2022-05-10 DIAGNOSIS — J040 Acute laryngitis: Secondary | ICD-10-CM

## 2022-05-10 DIAGNOSIS — J3089 Other allergic rhinitis: Secondary | ICD-10-CM

## 2022-05-10 DIAGNOSIS — I495 Sick sinus syndrome: Secondary | ICD-10-CM | POA: Diagnosis not present

## 2022-05-10 NOTE — Progress Notes (Signed)
HPI Sheila Morales returns today for followup. She is a pleasant 72 yo woman with a h/o sinus node dysfunction, s/p PPM insertion. She has returned to work. She denies chest pain or sob. She has some swelling at night but resolved by the morning. 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  Her amiodarone for PAF has been stopped.  No Known Allergies   Current Outpatient Medications  Medication Sig Dispense Refill   acetaminophen (TYLENOL) 325 MG tablet Take 650 mg by mouth every 6 (six) hours as needed for moderate pain or mild pain.     albuterol (VENTOLIN HFA) 108 (90 Base) MCG/ACT inhaler Inhale 1-2 puffs into the lungs every 6 (six) hours as needed for wheezing or shortness of breath. 8 g 5   amiodarone (PACERONE) 200 MG tablet Take 1 tablet (200 mg total) by mouth daily. Please keep scheduled appointments 90 tablet 0   apixaban (ELIQUIS) 5 MG TABS tablet Take 1 tablet (5 mg total) by mouth 2 (two) times daily. 180 tablet 0   benzonatate (TESSALON) 200 MG capsule TAKE 1 CAPSULE (200 MG TOTAL) BY MOUTH EVERY 6 (SIX) HOURS AS NEEDED FOR COUGH. 30 capsule 1   calcium carbonate (OS-CAL - DOSED IN MG OF ELEMENTAL CALCIUM) 1250 (500 Ca) MG tablet Take 1 tablet by mouth every evening.     Cholecalciferol (VITAMIN D) 50 MCG (2000 UT) tablet Take 4,000 Units by mouth every evening.     empagliflozin (JARDIANCE) 10 MG TABS tablet Take 1 tablet (10 mg total) by mouth daily before breakfast. 30 tablet 5   fluticasone (FLONASE) 50 MCG/ACT nasal spray SPRAY 2 SPRAYS INTO EACH NOSTRIL EVERY DAY 48 mL 3   folic acid (FOLVITE) 1 MG tablet Take 1 mg by mouth every evening.     furosemide (LASIX) 20 MG tablet TAKE 1 TABLET (20 MG) BY MOUTH DAILY, TAKE 1 EXTRA TABLET BY MOUTH (20 MG) AS NEEDED FOR INCREASED SWELLING. 90 tablet 3   gabapentin (NEURONTIN) 300 MG capsule Take 1 capsule (300 mg total) by mouth 3 (three) times daily. 630 capsule 0    methotrexate 2.5 MG tablet Take 10 mg by mouth every Thursday.     metoprolol succinate (TOPROL XL) 25 MG 24 hr tablet Take 1 tablet (25 mg total) by mouth daily. 90 tablet 3   Misc Natural Products (ADVANCED JOINT RELIEF) CAPS Take 1 capsule by mouth daily. Joint Advantage Gold 5x     Multiple Vitamin (MULTIVITAMIN WITH MINERALS) TABS tablet Take 1 tablet by mouth daily.     predniSONE (DELTASONE) 20 MG tablet Take 2 tablets (40 mg total) by mouth daily. As needed if bronchitis recurs. 10 tablet 1   RESTASIS 0.05 % ophthalmic emulsion Place 1 drop into both eyes 2 (two) times daily.  6   triamcinolone cream (KENALOG) 0.1 % Apply 1 Application topically as needed.     umeclidinium-vilanterol (ANORO ELLIPTA) 62.5-25 MCG/ACT AEPB Inhale 1 puff into the lungs daily. 1 each 0   vitamin C (ASCORBIC ACID) 500 MG tablet Take 500 mg by mouth daily.     vitamin E 180 MG (400 UNITS) capsule Take 400 Units by mouth daily.     zolpidem (AMBIEN) 5 MG tablet TAKE 1 TABLET BY MOUTH EVERY DAY AT BEDTIME AS NEEDED FOR SLEEP 30 tablet 5   No current facility-administered medications for this visit.     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   Past Surgical History:  Procedure Laterality Date   CHOLECYSTECTOMY     CHOLECYSTECTOMY, LAPAROSCOPIC     COLONOSCOPY      ESOPHAGOGASTRODUODENOSCOPY     lesion, vulva excision     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   Procedure: PACEMAKER IMPLANT;  Surgeon: [name], MD;  Location: [facility];  Service: Cardiovascular;  Laterality: N/A;   TONSILLECTOMY     VIDEO BRONCHOSCOPY Bilateral 11/06/2017   Procedure: VIDEO BRONCHOSCOPY WITHOUT FLUORO;  Surgeon: Marshell Garfinkel, MD;  Location: [facility];  Service: Cardiopulmonary;  Laterality: Bilateral;     Family History  Problem Relation Age of Onset   Stroke Mother        Deceased, 64s   Atrial fibrillation Father    Colon cancer Father    Dementia Sister    Other Sister        Pick's disease, deceased   Healthy Daughter      Social History   Socioeconomic History   Marital status: Married    Spouse name: Not on file   Number of children: 2   Years of education: 12   Highest education level: Not on file  Occupational History   Occupation: Education administrator business   Occupation: caregiver  Tobacco Use   Smoking status: Former    Packs/day: 0.25    Years: 30.00    Total pack years: 7.50    Types: Cigarettes    Quit date: 01/21/2020    Years since quitting: 2.3   Smokeless tobacco: Never   Tobacco comments:    smokes "every now and then" 08/24/20 [facility]   Vaping Use   Vaping Use: Never used  Substance and Sexual Activity   Alcohol use: Not Currently    Alcohol/week: 5.0 standard drinks of alcohol    Types: 5 Standard drinks or equivalent per week    Comment: 4 out of 7 dyas per husband at bedside   Drug use: No   Sexual activity: Yes  Other Topics Concern   Not on file  Social History Narrative   HSG. Married '71. 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       One level home with spouse   Right handed   Caffeine - coffee 2-3 cups am   Exercise - treadmill    Education - 12th grade   Social Determinants of Health   Financial Resource Strain: Low  Risk  (09/21/2021)   Overall Financial Resource Strain (CARDIA)    Difficulty of Paying Living Expenses: Not hard at all  Food Insecurity: No Food Insecurity (02/23/2022)   Hunger Vital Sign    Worried About  Running Out of Food in the Last Year: Never true    [place] in the Last Year: Never true  Transportation Needs: No Transportation Needs (02/23/2022)   PRAPARE - Hydrologist (Medical): No    Lack of Transportation (Non-Medical): No  Physical Activity: Inactive (09/21/2021)   Exercise Vital Sign    Days of Exercise per Week: 0 days    Minutes of Exercise per Session: 0 min  Stress: No Stress Concern Present (09/21/2021)   [place]    Feeling of Stress : Not at all  Social Connections: Moderately Integrated (09/21/2021)   Social Connection and Isolation Panel [NHANES]    Frequency of Communication with Friends and Family: Three times a week    Frequency of Social Gatherings with Friends and Family: Three times a week    Attends Religious Services: 1 to 4 times per year    Active Member of Clubs or Organizations: No    Attends Archivist Meetings: Never    Marital Status: Married  Human resources officer Violence: Not At Risk (02/23/2022)   Humiliation, Afraid, Rape, and Kick questionnaire    Fear of Current or Ex-Partner: No    Emotionally Abused: No    Physically Abused: No    Sexually Abused: No     BP 108/66   Pulse 77   Ht '5\' 2"'$  (1.575 m)   Wt 126 lb 12.8 oz (57.5 kg)   SpO2 95%   BMI 23.19 kg/m   Physical Exam:  Well appearing NAD HEENT: Unremarkable Neck:  No JVD, no thyromegally Lymphatics:  No adenopathy Back:  No CVA tenderness Lungs:  Clear with rare rales HEART:  Regular rate rhythm, no murmurs, no rubs, no clicks Abd:  soft, positive bowel sounds, no organomegally, no rebound, no guarding Ext:  2 plus pulses, no edema, no cyanosis, no clubbing Skin:  No  rashes no nodules Neuro:  CN II through XII intact, motor grossly intact  EKG - nsr with atrial pacing  DEVICE  Normal device function.  See PaceArt for details.   Assess/Plan: Sinus node dysfunction - she is asymptomatic s/p PPM insertion and is pacing 2/3 of the time in the atrium. 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 We will recheck in several months; HTN - her bp is well controlled. Interstitial lung disease - she is not oxygen dependent. She will continue her bronchodilators. Amio has been stopped.   Sheila Sinner Kash Davie,MD

## 2022-05-10 NOTE — Patient Instructions (Signed)
Medication Instructions:  Your physician recommends that you continue on your current medications as directed. Please refer to the Current Medication list given to you today.  *If you need a refill on your cardiac medications before your next appointment, please call your pharmacy*  Lab Work: None ordered.  If you have labs (blood work) drawn today and your tests are completely normal, you will receive your results only by: [place] (if you have MyChart) OR A paper copy in the mail If you have any lab test that is abnormal or we need to change your treatment, we will call you to review the results.  Testing/Procedures: None ordered.  Follow-Up: At [facility], you and your health needs are our priority.  As part of our continuing mission to provide you with exceptional heart care, we have created designated Provider Care Teams.  These Care Teams include your primary Cardiologist (physician) and Advanced Practice Providers (APPs -  Physician Assistants and Nurse Practitioners) who all work together to provide you with the care you need, when you need it.  We recommend signing up for the patient portal called "MyChart".  Sign up information is provided on this After Visit Summary.  MyChart is used to connect with patients for Virtual Visits (Telemedicine).  Patients are able to view lab/test results, encounter notes, upcoming appointments, etc.  Non-urgent messages can be sent to your provider as well.   To learn more about what you can do with MyChart, go to [email or web address].    Your next appointment:   1 year(s)  The format for your next appointment:   In Person  Provider:   [name], MD{or one of the following Advanced Practice Providers on your designated Care Team:   Tommye Standard, [place] Legrand Como "Jonni Sanger" Chalmers Cater, [place]  Remote monitoring is used to monitor your Pacemaker from home. This monitoring reduces the number of office visits required to check your device to  one time per year. It allows [place] to keep an eye on the functioning of your device to ensure it is working properly. You are scheduled for a device check from home on 05/27/22. You may send your transmission at any time that day. If you have a wireless device, the transmission will be sent automatically. After your physician reviews your transmission, you will receive a postcard with your next transmission date.  Important Information About Sugar

## 2022-05-14 DIAGNOSIS — R260 Ataxic gait: Secondary | ICD-10-CM | POA: Diagnosis not present

## 2022-05-14 DIAGNOSIS — R29818 Other symptoms and signs involving the nervous system: Secondary | ICD-10-CM | POA: Diagnosis not present

## 2022-05-14 DIAGNOSIS — M542 Cervicalgia: Secondary | ICD-10-CM | POA: Diagnosis not present

## 2022-05-17 DIAGNOSIS — M329 Systemic lupus erythematosus, unspecified: Secondary | ICD-10-CM | POA: Diagnosis not present

## 2022-05-17 DIAGNOSIS — R29818 Other symptoms and signs involving the nervous system: Secondary | ICD-10-CM | POA: Diagnosis not present

## 2022-05-17 DIAGNOSIS — R260 Ataxic gait: Secondary | ICD-10-CM | POA: Diagnosis not present

## 2022-05-17 DIAGNOSIS — M542 Cervicalgia: Secondary | ICD-10-CM | POA: Diagnosis not present

## 2022-05-20 ENCOUNTER — Telehealth: Payer: Self-pay | Admitting: Internal Medicine

## 2022-05-20 NOTE — Telephone Encounter (Signed)
See prior note about same I have already advised.

## 2022-05-20 NOTE — Telephone Encounter (Signed)
Was bale to schedule patient to come in to be advised about this since patient was suppose to do hospital follow back in November 2023

## 2022-05-20 NOTE — Telephone Encounter (Signed)
Patient called and stated she was set up to do a sleep test but was not able to do it because she was told she can use the machine with oxygen and she would need another test. She hasn't heard anything back and would like a callback to determine further steps. Best callback is [phone].

## 2022-05-21 ENCOUNTER — Other Ambulatory Visit: Payer: Self-pay | Admitting: Internal Medicine

## 2022-05-21 DIAGNOSIS — M542 Cervicalgia: Secondary | ICD-10-CM | POA: Diagnosis not present

## 2022-05-21 DIAGNOSIS — R29818 Other symptoms and signs involving the nervous system: Secondary | ICD-10-CM | POA: Diagnosis not present

## 2022-05-21 DIAGNOSIS — R260 Ataxic gait: Secondary | ICD-10-CM | POA: Diagnosis not present

## 2022-05-24 ENCOUNTER — Telehealth: Payer: Self-pay | Admitting: Internal Medicine

## 2022-05-24 DIAGNOSIS — R260 Ataxic gait: Secondary | ICD-10-CM | POA: Diagnosis not present

## 2022-05-24 DIAGNOSIS — R29818 Other symptoms and signs involving the nervous system: Secondary | ICD-10-CM | POA: Diagnosis not present

## 2022-05-24 DIAGNOSIS — I480 Paroxysmal atrial fibrillation: Secondary | ICD-10-CM

## 2022-05-24 DIAGNOSIS — M542 Cervicalgia: Secondary | ICD-10-CM | POA: Diagnosis not present

## 2022-05-24 MED ORDER — APIXABAN 5 MG PO TABS: 5.0000 mg | ORAL_TABLET | Freq: Two times a day (BID) | ORAL | 1 refills | Status: DC

## 2022-05-24 NOTE — Telephone Encounter (Signed)
This was refilled on 05/21/22 so is not due for refill and this is highest safe amount in her age range we cannot increase. If desired different option can schedule visit to discuss change

## 2022-05-24 NOTE — Telephone Encounter (Signed)
Prescription refill request for Eliquis received. Indication:afib Last office visit:1/24 Scr:1.2  12/23 Age: 72 Weight:57.5  kg  Prescription refilled

## 2022-05-24 NOTE — Telephone Encounter (Signed)
Pt called requesting for Rx zolpidem (AMBIEN) 5 MG tablet Pt also stated that she would like a higher dosage pt stated that the '5mg'$  is not working for her.

## 2022-05-24 NOTE — Telephone Encounter (Signed)
*  STAT* If patient is at the pharmacy, call can be transferred to refill team.   1. Which medications need to be refilled? (please list name of each medication and dose if known)   apixaban (ELIQUIS) 5 MG TABS tablet    2. Which pharmacy/location (including street and city if local pharmacy) is medication to be sent to?  [id number]- [place], [place] - [address]    3. Do they need a 30 day or 90 day supply?  90 day   Pt has only 1 tablet

## 2022-05-27 ENCOUNTER — Ambulatory Visit: Payer: PPO

## 2022-05-27 DIAGNOSIS — I495 Sick sinus syndrome: Secondary | ICD-10-CM

## 2022-05-27 NOTE — Telephone Encounter (Signed)
Patient states that she will be coming in on the 7th to be seen and will discuss about increase of medication dosage then

## 2022-05-28 ENCOUNTER — Encounter: Payer: Self-pay | Admitting: Internal Medicine

## 2022-05-28 DIAGNOSIS — R260 Ataxic gait: Secondary | ICD-10-CM | POA: Diagnosis not present

## 2022-05-28 DIAGNOSIS — M542 Cervicalgia: Secondary | ICD-10-CM | POA: Diagnosis not present

## 2022-05-28 DIAGNOSIS — R29818 Other symptoms and signs involving the nervous system: Secondary | ICD-10-CM | POA: Diagnosis not present

## 2022-05-28 LAB — CUP PACEART REMOTE DEVICE CHECK
Battery Voltage: 80
Date Time Interrogation Session: 20240205095705
Implantable Lead Connection Status: 753985
Implantable Lead Connection Status: 753985
Implantable Lead Implant Date: 20220204
Implantable Lead Implant Date: 20220204
Implantable Lead Location: 753859
Implantable Lead Location: 753860
Implantable Lead Model: 377169
Implantable Lead Model: 377171
Implantable Lead Serial Number: 8000143637
Implantable Lead Serial Number: 8000151447
Implantable Pulse Generator Implant Date: 20220204
Pulse Gen Model: 407145
Pulse Gen Serial Number: 70020732

## 2022-05-29 ENCOUNTER — Ambulatory Visit: Payer: PPO | Admitting: Internal Medicine

## 2022-05-31 DIAGNOSIS — M542 Cervicalgia: Secondary | ICD-10-CM | POA: Diagnosis not present

## 2022-05-31 DIAGNOSIS — R260 Ataxic gait: Secondary | ICD-10-CM | POA: Diagnosis not present

## 2022-05-31 DIAGNOSIS — R29818 Other symptoms and signs involving the nervous system: Secondary | ICD-10-CM | POA: Diagnosis not present

## 2022-06-03 ENCOUNTER — Other Ambulatory Visit: Payer: Self-pay | Admitting: Pulmonary Disease

## 2022-06-04 DIAGNOSIS — M542 Cervicalgia: Secondary | ICD-10-CM | POA: Diagnosis not present

## 2022-06-04 DIAGNOSIS — R260 Ataxic gait: Secondary | ICD-10-CM | POA: Diagnosis not present

## 2022-06-04 DIAGNOSIS — R29818 Other symptoms and signs involving the nervous system: Secondary | ICD-10-CM | POA: Diagnosis not present

## 2022-06-05 DIAGNOSIS — R7989 Other specified abnormal findings of blood chemistry: Secondary | ICD-10-CM | POA: Diagnosis not present

## 2022-06-05 LAB — HEPATIC FUNCTION PANEL
ALT: 299 U/L — AB (ref 7–35)
AST: 200 — AB (ref 13–35)
Alkaline Phosphatase: 106 (ref 25–125)

## 2022-06-05 LAB — COMPREHENSIVE METABOLIC PANEL: Albumin: 3.6 (ref 3.5–5.0)

## 2022-06-07 DIAGNOSIS — M542 Cervicalgia: Secondary | ICD-10-CM | POA: Diagnosis not present

## 2022-06-07 DIAGNOSIS — R29818 Other symptoms and signs involving the nervous system: Secondary | ICD-10-CM | POA: Diagnosis not present

## 2022-06-07 DIAGNOSIS — R260 Ataxic gait: Secondary | ICD-10-CM | POA: Diagnosis not present

## 2022-06-10 ENCOUNTER — Telehealth: Payer: Self-pay | Admitting: Internal Medicine

## 2022-06-10 NOTE — Telephone Encounter (Signed)
Spoke with patient and she stated that she was she needing an providers approval. I advised patient to give her pharmacy a call back and let them know she has refills and it is okay by the provider.

## 2022-06-10 NOTE — Telephone Encounter (Signed)
Please see prior notes. She has refills but is likely not due for refill yet as it was filled 05/21/22. She should coordinate with pharmacy.

## 2022-06-10 NOTE — Telephone Encounter (Signed)
Pt called requesting for Rx: zolpidem (AMBIEN) 5 MG tablet to be refilled. Pt also stated that  she is only getting 2 hours of sleep a day.  Please call pt back with update

## 2022-06-11 DIAGNOSIS — R29818 Other symptoms and signs involving the nervous system: Secondary | ICD-10-CM | POA: Diagnosis not present

## 2022-06-11 DIAGNOSIS — R260 Ataxic gait: Secondary | ICD-10-CM | POA: Diagnosis not present

## 2022-06-11 DIAGNOSIS — M542 Cervicalgia: Secondary | ICD-10-CM | POA: Diagnosis not present

## 2022-06-12 ENCOUNTER — Encounter: Payer: Self-pay | Admitting: Internal Medicine

## 2022-06-14 DIAGNOSIS — M542 Cervicalgia: Secondary | ICD-10-CM | POA: Diagnosis not present

## 2022-06-14 DIAGNOSIS — R29818 Other symptoms and signs involving the nervous system: Secondary | ICD-10-CM | POA: Diagnosis not present

## 2022-06-14 DIAGNOSIS — R260 Ataxic gait: Secondary | ICD-10-CM | POA: Diagnosis not present

## 2022-06-16 NOTE — Progress Notes (Signed)
Cardiology Office Note:    Date:  06/17/2022  ID:  [name], DOB 1950/06/14, MRN [id number]  PCP:  Sheila Koch, MD  Cardiologist:  Sheila Munroe, MD  Electrophysiologist:  Sheila Peru, MD   Referring MD: Sheila Morales, *   Chief Complaint/Reason for Referral: Bradycardia, dizziness s/p PPM  History of Present Illness:    Sheila Morales is a 71 y.o. female with a history of COPD, SLE on methorexate, BPPV, and HTN who presents for follow up. Implantation of a Biotronik dual-chamber pacemaker for symptomatic bradycardia due to sinus node dysfunction 05/26/20. Her husband Sheila Morales joins her for the visit today.  06/17/22:  Off plaquenil due to concern for Plaquenil CM based on cardiac MRI. Followed by AHF Dr. Daniel Morales as well. Did have a hospitalization in 01/2022 with decompensated HF, requiring IV diuresis. Meds include lasix, jardience. Also on Toprol XL. Sheila Morales discussed but has not been added yet. 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 Also chronic eosinophilic PNA? Responsive to prednisone. Also with moderate-severe COPD, on Anoro. Feeling ok lately. 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 Remains limited by significant day time fatigue, sleepiness and physical weakness. We reviewed that we have not yet accomplished our sleep study, I will re-refer to PCCM for this. Dr. Sharlet Morales also pursuing. I strongly suspect OSA.  Known cervical neck disease, may be contributing to left arm weakness. She notes that if she is on the floor she cannot independently get up. She is working with EP.  Followed by EP, had abnormal LFTs, amiodarone has been on hold, given for PAF. We reviewed with pharmD that jardience not likely the culprit in elevated LFTs.    Prior visits: 08/08/21: Over the last several months she has had a complex history involving 2 hospitalizations.  She was initially hospitalized November 30 through December 11 with fatigue and confusion and  profound hyponatremia to 113.  Noted to have E. coli bacteremia with UTI and right-sided pyelonephritis by CT imaging.  She had new onset rapid atrial fibrillation during that hospital stay and acute congestive heart failure (HFpEF), heart failure necessitating BiPAP in the intensive care unit.  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  She returned to the hospital on April 22, 2021 with fever and confusion as well as lethargy, myalgias and fatigue.  She was also noted to have intermittent fevers.  Admitted for acute metabolic encephalopathy, and also had stool positive for occult blood.  Pulmonology recommended long-term prednisone taper and PJP prophylaxis.  Patient continues on methotrexate and hydroxychloroquine for lupus.  Eliquis had been initiated during her prior hospital stay for atrial fibrillation, and at her subsequent hospitalization she was noted to have acute blood loss anemia with a hemoglobin of 7.3.  Felt to have slow gastrointestinal bleed from Eliquis.  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  She had a peripheral eosinophilia.  Symptoms respond strongly to prednisone.  Bronchoscopy considered but deferred due to tenuous clinical status in the hospital.  Consideration also for methotrexate lung toxicity as well as lupus related ILD.  She presented to the ED 07/29/2021 after a mechanical fall thought to have occurred because of ongoing nonfocal weakness and pivoting too quickly. She had fallen the previous day as  well. She was noted to have two transverse process fractures as well as nondisplaced fracture of her left 11th rib. Medicated with percocet and discharged with prescription for pain medication.  Her husband provides the majority  of the history today. Today, she continues to suffer from significant pain due to her rib fracture. She has tried to substitute tylenol instead of percocet, but they note tylenol is not strong enough to manage her pain. I recommended she contact her PCP for assistance with finding a medicinal alternative. She has been off of prednisone for about 3 weeks. Of note, they report she had facial edema while she was on prednisone. This has improved today. UA negative for protein recently, less likely nephritic swelling.  Generally when she is retaining fluid, she has edema in her legs. She states that her legs are constantly a little bit swollen. The edema seems to stay localized around her ankles, and has not progressed proximally. About three weeks ago she was started on a Lasix 40 mg daily and started on potassium. Since then, her husband has noticed she is more unstable, has more confusion, and is severely fatigued and short of breath with minimal exertion such as walking to this office from the parking lot. It is difficult for her to determine if she is feeling better or worse with the higher dose of Lasix. Her urine production is only somewhat increased. Today she believes she is okay regarding fluid retention, she did take her morning dose of Lasix so far. Typically she drinks around 50 oz of water a day. Intended to follow a fluid restriction due to SIADH and hyponatremia.  Lately they have noticed some weight gain. This has been gradual in the past 7-8 weeks. They attribute her increased weight due to her recent health issues preventing her from formal exercise. She is limited to sitting due to her pain. Previously she was able to walk at 1.7 mph for 5-10 min on a treadmill, but is not able to do so at this time.  Her husband notes in the past week she has been in Atrial fibrillation. This was the first episode in a while.  Additionally she complains of sudden, intermittent episodes of body tremors that  mostly affect her extremities.  Denies palpitations, PND, orthopnea.  Denies syncope or presyncope.   They plan to take a vacation to [place] in June "come hell or high water".  08/14/21:  Appears brighter and feeling slightly better particularly since decreasing lasix and rib fractures are healing. LE edema is stable with decreased dose of lasix. We discontinued potassium at last office visit and K is stable on labs today. Sodium improved, Dr. Sharlet Morales told them this AM to discontinue salt tabs. Cr improved since last visit with decrease in lasix. Reviewed future testing again including CMR and PET-CT perfusion upcoming.  09/27/21:  Husband feels she has improved in the last 2 weeks.  Her weight is steadily at 126 to 128 pounds.  The compression socks worked well and currently she has no lower extremity swelling.  She has continued on Lasix 20 mg daily.  We reviewed the results of her PET/CT myocardial perfusion study which was normal. Cardiac MRI showed: "Midmyocardial circumferential stripe of delayed enhancement at the base. Midmyocardial delayed enhancement in the lateral wall at the mid ventricle. Findings are nonspecific. Overall, findings may represent hypertensive heart disease. However, patient is chronically on plaquenil and this constellation of findings in combination with clinical presentation does not exclude plaquenil cardiomyopathy." We  discussed that I will mention these findings to her rheumatologist and discuss if stopping plaquenil would be an option.  Discussed starting jardience for HFpEF symptoms.   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     Past Surgical History:  Procedure Laterality Date   CHOLECYSTECTOMY     CHOLECYSTECTOMY, LAPAROSCOPIC     COLONOSCOPY     ESOPHAGOGASTRODUODENOSCOPY     lesion, vulva excision     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   Procedure: PACEMAKER IMPLANT;  Surgeon: [name], MD;  Location: [facility];  Service: Cardiovascular;  Laterality: N/A;   TONSILLECTOMY     VIDEO BRONCHOSCOPY Bilateral 11/06/2017   Procedure: VIDEO BRONCHOSCOPY WITHOUT FLUORO;  Surgeon: Marshell Garfinkel, MD;  Location: [facility];  Service: Cardiopulmonary;  Laterality: Bilateral;    Current Medications: Current Meds  Medication Sig   acetaminophen (TYLENOL) 325 MG tablet Take 650 mg by mouth every 6 (six) hours as needed for moderate pain or mild pain.   albuterol (VENTOLIN HFA) 108 (90 Base) MCG/ACT inhaler Inhale 1-2 puffs into the lungs every 6 (six) hours as needed for wheezing or shortness of breath.   apixaban (ELIQUIS) 5 MG TABS tablet Take 1 tablet (5 mg total) by mouth 2 (two) times daily.   benzonatate (TESSALON) 200 MG capsule TAKE 1 CAPSULE (200 MG TOTAL) BY MOUTH EVERY 6 (SIX) HOURS AS NEEDED FOR COUGH.   calcium carbonate (OS-CAL - DOSED IN MG OF ELEMENTAL CALCIUM) 1250 (500 Ca) MG tablet Take 1 tablet by mouth every evening.   Cholecalciferol (VITAMIN D) 50 MCG (2000 UT) tablet Take 4,000 Units by mouth every evening.   fluticasone (FLONASE) 50 MCG/ACT nasal spray SPRAY 2 SPRAYS INTO EACH NOSTRIL EVERY DAY (Patient taking differently: 2 sprays 2 (two) times daily as needed.)   folic acid (FOLVITE) 1 MG tablet Take 1 mg by mouth every evening.   furosemide (LASIX) 20 MG tablet TAKE 1 TABLET (20 MG) BY MOUTH DAILY, TAKE 1 EXTRA TABLET BY MOUTH (20 MG) AS NEEDED FOR  INCREASED SWELLING.   gabapentin (NEURONTIN) 300 MG capsule Take 1 capsule (300 mg total) by mouth 3 (three) times daily.   metoprolol succinate (TOPROL XL) 25 MG 24 hr tablet Take 1 tablet (25 mg total) by mouth daily.   Misc Natural Products (ADVANCED JOINT RELIEF) CAPS Take 1 capsule by mouth daily. Joint Advantage Gold 5x   Multiple Vitamin (MULTIVITAMIN WITH MINERALS) TABS tablet Take 1 tablet by mouth daily.   RESTASIS 0.05 % ophthalmic emulsion Place 1 drop into both eyes 2 (two) times daily.   triamcinolone cream (KENALOG) 0.1 % Apply 1 Application topically as needed.   vitamin C (ASCORBIC ACID) 500 MG tablet Take 500 mg by mouth daily.   vitamin E 180 MG (400 UNITS) capsule Take 400 Units by mouth  daily.   zolpidem (AMBIEN) 5 MG tablet TAKE 1 TABLET BY MOUTH AT BEDTIME AS NEEDED FOR SLEEP   [DISCONTINUED] predniSONE (DELTASONE) 20 MG tablet Take 2 tablets (40 mg total) by mouth daily. As needed if bronchitis recurs. (Patient not taking: Reported on 07/09/2022)   [DISCONTINUED] umeclidinium-vilanterol (ANORO ELLIPTA) 62.5-25 MCG/ACT AEPB Inhale 1 puff into the lungs daily.     Allergies:   Patient has no known allergies.   Social History   Tobacco Use   Smoking status: Former    Packs/day: 0.25    Years: 30.00    Additional pack years: 0.00    Total pack years: 7.50    Types: Cigarettes    Quit date: 01/21/2020    Years since quitting: 2.5   Smokeless tobacco: Never   Tobacco comments:    smokes "every now and then" 08/24/20 [facility]   Vaping Use   Vaping Use: Never used  Substance Use Topics   Alcohol use: Not Currently    Alcohol/week: 5.0 standard drinks of alcohol    Types: 5 Standard drinks or equivalent per week    Comment: 4 out of 7 dyas per husband at bedside   Drug use: No     Family History: The patient's family history includes Atrial fibrillation in her father; Colon cancer in her father; Dementia in her sister; Healthy in her daughter; Other in her sister;  Stroke in her mother.  ROS:   Please see the history of present illness.    (+) Left costal pain (+) Gait instability (+) Confusion (+) Fatigue (+) Shortness of breath (+) Bilateral LE edema (+) Weight gain (+) Tremors All other systems reviewed and are negative.  EKGs/Labs/Other Studies Reviewed:    The following studies were reviewed today:  Echo 03/22/2021: Sonographer Comments: Technically difficult study due to poor echo  windows.  IMPRESSIONS    1. Left ventricular ejection fraction, by estimation, is 70 to 75%. The  left ventricle has hyperdynamic function. The left ventricle has no  regional wall motion abnormalities. There is mild left ventricular  hypertrophy. Left ventricular diastolic  parameters are consistent with Grade II diastolic dysfunction  (pseudonormalization). Elevated left atrial pressure.   2. Right ventricular systolic function is normal. The right ventricular  size is normal. There is mildly elevated pulmonary artery systolic  pressure.   3. Left atrial size was mildly dilated.   4. The mitral valve is normal in structure. Mild mitral valve  regurgitation. No evidence of mitral stenosis.   5. The aortic valve is tricuspid. Aortic valve regurgitation is not  visualized. Aortic valve sclerosis/calcification is present, without any  evidence of aortic stenosis.   6. The inferior vena cava is normal in size with greater than 50%  respiratory variability, suggesting right atrial pressure of 3 mmHg.   Comparison(s): No significant change from prior study.  Pacemaker Implant 05/26/2020: CONCLUSIONS:   1. Successful implantation of a Biotronik dual-chamber pacemaker for symptomatic bradycardia due to sinus node dysfunction  2. No early apparent complications.    EKG:  None today. EKG is personally reviewed and interpreted. 08/08/2021: EKG was not ordered. 07/29/2021 (ED): Atrial-paced rhythm at 66 bpm. Borderline T abnormalities, lateral  leads.  Recent Labs: 08/07/2021: TSH 1.86 02/23/2022: Magnesium 2.3 03/02/2022: Hemoglobin 12.4; Platelets 338 03/26/2022: B Natriuretic Peptide 705.3 04/17/2022: BUN 19; Creatinine 1.2; Potassium 4.3; Sodium 135 06/05/2022: ALT 299   Recent Lipid Panel    Component Value Date/Time   CHOL 148 03/29/2019 0909  TRIG 167 (H) 10/24/2021 2357   HDL 63.60 03/29/2019 0909   CHOLHDL 2 03/29/2019 0909   VLDL 13.2 03/29/2019 0909   LDLCALC 71 03/29/2019 0909   LDLDIRECT 52.0 02/24/2017 1351    Physical Exam:    VS:  BP 90/60   Pulse 74   Ht 5\' 2"  (1.575 m)   Wt 133 lb 12.8 oz (60.7 kg)   SpO2 98%   BMI 24.47 kg/m     Wt Readings from Last 5 Encounters:  07/09/22 132 lb 12.8 oz (60.2 kg)  06/18/22 133 lb (60.3 kg)  06/17/22 133 lb 12.8 oz (60.7 kg)  05/10/22 126 lb 12.8 oz (57.5 kg)  03/27/22 129 lb (58.5 kg)    Constitutional: No acute distress Eyes: sclera non-icteric, normal conjunctiva and lids ENMT: normal dentition, moist mucous membranes Cardiovascular: regular rhythm, normal rate, no murmurs. S1 and S2 normal. No jugular venous distention.   Respiratory: clear to auscultation bilaterally GI : normal bowel sounds, soft and nontender. No distention.   MSK: extremities warm, well perfused.  Trace ankle edema.  NEURO: grossly nonfocal exam, moves all extremities. PSYCH: alert and oriented x 3, normal mood and affect.   ASSESSMENT:    1. Dyspnea on exertion   2. Lower extremity edema   3. Acute on chronic heart failure with preserved ejection fraction ([place])   4. Facial swelling   5. Medication management   6. AF (paroxysmal atrial fibrillation) ([place])   7. Secondary hypercoagulable state ([place])   8. Fatigue, unspecified type   9. Generalized weakness   10. Bradycardia   11. Dizziness   12. Sinus node dysfunction (HCC)   13. Pacemaker   14. Hyponatremia   15. SIADH (syndrome of inappropriate ADH production) ([place])   16. Snoring   17. Hypertension, unspecified type    18. Chronic obstructive pulmonary disease, unspecified COPD type ([place])   19. Systemic lupus erythematosus with lung involvement, unspecified SLE type ([place])   20. Interstitial lung disease (HCC)    PLAN:    Dyspnea on exertion Acute on chronic HFpEF LE edema Facial edema Medication management - multifactorial dyspnea, follows with pulmonary. Class II today. - continue lasix - PET-CT normal - cardiac MRI was negative for amyloid but may indicate plaquenil CM vs HTN heart disease.  - jardience 10 mg daily for HFpEF, refill today.   Atrial fibrillation, paroxysmal Secondary hypercoagulable state -Continue Eliquis 5 mg twice daily. Atrial paced rhythm on recent ECG. -amiodarone stopped due to elevated LFTs. Remains on metoprolol tartrate 25 mg twice daily.  Weakness Fatigue Imbalance Snoring -No definite cardiovascular etiology for the symptoms. - may be related to OSA, pursue sleep study either through PCP or Pulmonary. This needs to be evaluated. I otherwise do not have much else to suggest for fatigue from cardiovascular origin.   Bradycardia Dizziness Sinus node dysfunction (HCC) Pacemaker -Device interrogations have been unremarkable for cause for the above-mentioned symptoms.  Hyponatremia - felt to be SIADH. She is on fluid restriction. Salt tabs per PCP.  Hypertension-blood pressure mildly low today. Monitor for now.  Total time of encounter: 30 minutes total time of encounter, including 20 minutes spent in face-to-face patient care on the date of this encounter. This time includes coordination of care and counseling regarding above mentioned problem list. Remainder of non-face-to-face time involved reviewing chart documents/testing relevant to the patient encounter and documentation in the medical record. I have independently reviewed documentation from referring provider.   Cherlynn Kaiser, MD, [facility]  [facility]     Medication Adjustments/Labs  and Tests Ordered: Current medicines are reviewed at length with the patient today.  Concerns regarding medicines are outlined above.   Orders Placed This Encounter  Procedures   Ambulatory referral to Pulmonology    Meds ordered this encounter  Medications   empagliflozin (JARDIANCE) 10 MG TABS tablet    Sig: Take 1 tablet (10 mg total) by mouth daily before breakfast.    Dispense:  30 tablet    Refill:  11    Patient Instructions  Medication Instructions:  JARDIANCE REFILLED  *If you need a refill on your cardiac medications before your next appointment, please call your pharmacy*  Lab Work: None Ordered At This Time.  If you have labs (blood work) drawn today and your tests are completely normal, you will receive your results only by: [place] (if you have MyChart) OR A paper copy in the mail If you have any lab test that is abnormal or we need to change your treatment, we will call you to review the results.  Testing/Procedures: SLEEP STUDY WITH PULMONOLOGY- SOMEONE WILL CALL YOU TO GET YOU SCHEDULED   Follow-Up: At [facility], you and your health needs are our priority.  As part of our continuing mission to provide you with exceptional heart care, we have created designated Provider Care Teams.  These Care Teams include your primary Cardiologist (physician) and Advanced Practice Providers (APPs -  Physician Assistants and Nurse Practitioners) who all work together to provide you with the care you need, when you need it.  Your next appointment:   6 month(s)  Provider:   Elouise Munroe, MD

## 2022-06-17 ENCOUNTER — Ambulatory Visit: Payer: PPO | Attending: Internal Medicine | Admitting: Internal Medicine

## 2022-06-17 ENCOUNTER — Encounter: Payer: Self-pay | Admitting: Internal Medicine

## 2022-06-17 VITALS — BP 90/60 | HR 74 | Ht 62.0 in | Wt 133.8 lb

## 2022-06-17 DIAGNOSIS — D6869 Other thrombophilia: Secondary | ICD-10-CM

## 2022-06-17 DIAGNOSIS — R001 Bradycardia, unspecified: Secondary | ICD-10-CM | POA: Diagnosis not present

## 2022-06-17 DIAGNOSIS — R0683 Snoring: Secondary | ICD-10-CM

## 2022-06-17 DIAGNOSIS — R42 Dizziness and giddiness: Secondary | ICD-10-CM

## 2022-06-17 DIAGNOSIS — I10 Essential (primary) hypertension: Secondary | ICD-10-CM

## 2022-06-17 DIAGNOSIS — I5033 Acute on chronic diastolic (congestive) heart failure: Secondary | ICD-10-CM

## 2022-06-17 DIAGNOSIS — I495 Sick sinus syndrome: Secondary | ICD-10-CM | POA: Diagnosis not present

## 2022-06-17 DIAGNOSIS — M3213 Lung involvement in systemic lupus erythematosus: Secondary | ICD-10-CM

## 2022-06-17 DIAGNOSIS — Z950 Presence of cardiac pacemaker: Secondary | ICD-10-CM

## 2022-06-17 DIAGNOSIS — I480 Paroxysmal atrial fibrillation: Secondary | ICD-10-CM

## 2022-06-17 DIAGNOSIS — R0609 Other forms of dyspnea: Secondary | ICD-10-CM

## 2022-06-17 DIAGNOSIS — R531 Weakness: Secondary | ICD-10-CM

## 2022-06-17 DIAGNOSIS — R220 Localized swelling, mass and lump, head: Secondary | ICD-10-CM

## 2022-06-17 DIAGNOSIS — J849 Interstitial pulmonary disease, unspecified: Secondary | ICD-10-CM

## 2022-06-17 DIAGNOSIS — R5383 Other fatigue: Secondary | ICD-10-CM | POA: Diagnosis not present

## 2022-06-17 DIAGNOSIS — Z79899 Other long term (current) drug therapy: Secondary | ICD-10-CM | POA: Diagnosis not present

## 2022-06-17 DIAGNOSIS — R600 Localized edema: Secondary | ICD-10-CM

## 2022-06-17 DIAGNOSIS — E871 Hypo-osmolality and hyponatremia: Secondary | ICD-10-CM

## 2022-06-17 DIAGNOSIS — E222 Syndrome of inappropriate secretion of antidiuretic hormone: Secondary | ICD-10-CM

## 2022-06-17 DIAGNOSIS — J449 Chronic obstructive pulmonary disease, unspecified: Secondary | ICD-10-CM

## 2022-06-17 MED ORDER — EMPAGLIFLOZIN 10 MG PO TABS: 10.0000 mg | ORAL_TABLET | Freq: Every day | ORAL | 11 refills | Status: DC

## 2022-06-17 NOTE — Patient Instructions (Signed)
Medication Instructions:  JARDIANCE REFILLED  *If you need a refill on your cardiac medications before your next appointment, please call your pharmacy*  Lab Work: None Ordered At This Time.  If you have labs (blood work) drawn today and your tests are completely normal, you will receive your results only by: [place] (if you have MyChart) OR A paper copy in the mail If you have any lab test that is abnormal or we need to change your treatment, we will call you to review the results.  Testing/Procedures: SLEEP STUDY WITH PULMONOLOGY- SOMEONE WILL CALL YOU TO GET YOU SCHEDULED   Follow-Up: At [facility], you and your health needs are our priority.  As part of our continuing mission to provide you with exceptional heart care, we have created designated Provider Care Teams.  These Care Teams include your primary Cardiologist (physician) and Advanced Practice Providers (APPs -  Physician Assistants and Nurse Practitioners) who all work together to provide you with the care you need, when you need it.  Your next appointment:   6 month(s)  Provider:   Elouise Munroe, MD

## 2022-06-18 ENCOUNTER — Encounter: Payer: Self-pay | Admitting: Internal Medicine

## 2022-06-18 ENCOUNTER — Ambulatory Visit: Payer: PPO | Admitting: Internal Medicine

## 2022-06-18 ENCOUNTER — Ambulatory Visit (INDEPENDENT_AMBULATORY_CARE_PROVIDER_SITE_OTHER): Payer: PPO | Admitting: Internal Medicine

## 2022-06-18 VITALS — BP 112/80 | Temp 97.8°F | Ht 62.0 in | Wt 133.0 lb

## 2022-06-18 DIAGNOSIS — R260 Ataxic gait: Secondary | ICD-10-CM | POA: Diagnosis not present

## 2022-06-18 DIAGNOSIS — R7989 Other specified abnormal findings of blood chemistry: Secondary | ICD-10-CM | POA: Diagnosis not present

## 2022-06-18 DIAGNOSIS — R29818 Other symptoms and signs involving the nervous system: Secondary | ICD-10-CM | POA: Diagnosis not present

## 2022-06-18 DIAGNOSIS — M542 Cervicalgia: Secondary | ICD-10-CM | POA: Diagnosis not present

## 2022-06-18 NOTE — Progress Notes (Unsigned)
   Subjective:   Patient ID: Sheila Morales, female    DOB: 04-Oct-1950, 72 y.o.   MRN: [id number]  HPI The patient is a 72 YO female coming in for concerns about elevated LFTs. Checked and gradually increasing last 6 months abruptly higher January with rheumatology and they rechecked with no alcohol or tylenol 2 weeks later minimal improvement. They have now stopped methotrexate and amiodarone.   Review of Systems  Constitutional: Negative.   HENT: Negative.    Eyes: Negative.   Respiratory:  Negative for cough, chest tightness and shortness of breath.   Cardiovascular:  Negative for chest pain, palpitations and leg swelling.  Gastrointestinal:  Negative for abdominal distention, abdominal pain, constipation, diarrhea, nausea and vomiting.  Musculoskeletal: Negative.   Skin: Negative.   Neurological: Negative.   Psychiatric/Behavioral: Negative.      Objective:  Physical Exam Constitutional:      Appearance: She is well-developed.  HENT:     Head: Normocephalic and atraumatic.  Cardiovascular:     Rate and Rhythm: Normal rate and regular rhythm.  Pulmonary:     Effort: Pulmonary effort is normal. No respiratory distress.     Breath sounds: Normal breath sounds. No wheezing or rales.  Abdominal:     General: Bowel sounds are normal. There is no distension.     Palpations: Abdomen is soft.     Tenderness: There is no abdominal tenderness. There is no rebound.  Musculoskeletal:     Cervical back: Normal range of motion.  Skin:    General: Skin is warm and dry.  Neurological:     Mental Status: She is alert and oriented to person, place, and time.     Coordination: Coordination normal.     Vitals:   06/18/22 1505  BP: 112/80  Temp: 97.8 F (36.6 C)  TempSrc: Oral  Weight: 133 lb (60.3 kg)  Height: 5' 2"$  (1.575 m)    Assessment & Plan:  Visit time 20 minutes in face to face communication with patient and coordination of care, additional 10 minutes spent in record  review, coordination or care, ordering tests, communicating/referring to other healthcare professionals, documenting in medical records all on the same day of the visit for total time 30 minutes spent on the visit.

## 2022-06-20 ENCOUNTER — Encounter: Payer: Self-pay | Admitting: Internal Medicine

## 2022-06-20 DIAGNOSIS — R7989 Other specified abnormal findings of blood chemistry: Secondary | ICD-10-CM | POA: Insufficient documentation

## 2022-06-20 NOTE — Assessment & Plan Note (Signed)
Alk phos and bilirubin normal. AST and ALT elevated to around 300-400. They did marginally improve with cessation of alcohol (1 drink max daily not everyday prior). Suspect drug induced injury with the numbers and pattern. No signs of symptoms of jaundice on exam. They have stopped methotrexate and amiodarone. Suspect methotrexate but either could be culprit. We discussed amiodarone can persist in her system for several months after she stops taking the medicine. I have advised recheck LFT in 1-2 months. She has follow up with rheumatology in April which is appropriate timing. If medication induced may be normal or near normal at that time. If no improvement, worsening will need further assessment.

## 2022-06-21 DIAGNOSIS — R260 Ataxic gait: Secondary | ICD-10-CM | POA: Diagnosis not present

## 2022-06-21 DIAGNOSIS — M542 Cervicalgia: Secondary | ICD-10-CM | POA: Diagnosis not present

## 2022-06-21 DIAGNOSIS — R29818 Other symptoms and signs involving the nervous system: Secondary | ICD-10-CM | POA: Diagnosis not present

## 2022-06-25 DIAGNOSIS — R29818 Other symptoms and signs involving the nervous system: Secondary | ICD-10-CM | POA: Diagnosis not present

## 2022-06-25 DIAGNOSIS — M542 Cervicalgia: Secondary | ICD-10-CM | POA: Diagnosis not present

## 2022-06-25 DIAGNOSIS — R260 Ataxic gait: Secondary | ICD-10-CM | POA: Diagnosis not present

## 2022-06-28 DIAGNOSIS — R29818 Other symptoms and signs involving the nervous system: Secondary | ICD-10-CM | POA: Diagnosis not present

## 2022-06-28 DIAGNOSIS — R260 Ataxic gait: Secondary | ICD-10-CM | POA: Diagnosis not present

## 2022-06-28 DIAGNOSIS — M542 Cervicalgia: Secondary | ICD-10-CM | POA: Diagnosis not present

## 2022-07-02 ENCOUNTER — Encounter (HOSPITAL_COMMUNITY): Payer: PPO | Admitting: Cardiology

## 2022-07-02 DIAGNOSIS — R260 Ataxic gait: Secondary | ICD-10-CM | POA: Diagnosis not present

## 2022-07-02 DIAGNOSIS — M542 Cervicalgia: Secondary | ICD-10-CM | POA: Diagnosis not present

## 2022-07-02 DIAGNOSIS — R29818 Other symptoms and signs involving the nervous system: Secondary | ICD-10-CM | POA: Diagnosis not present

## 2022-07-05 DIAGNOSIS — M542 Cervicalgia: Secondary | ICD-10-CM | POA: Diagnosis not present

## 2022-07-05 DIAGNOSIS — R260 Ataxic gait: Secondary | ICD-10-CM | POA: Diagnosis not present

## 2022-07-05 DIAGNOSIS — R29818 Other symptoms and signs involving the nervous system: Secondary | ICD-10-CM | POA: Diagnosis not present

## 2022-07-09 ENCOUNTER — Ambulatory Visit (INDEPENDENT_AMBULATORY_CARE_PROVIDER_SITE_OTHER): Payer: PPO | Admitting: Adult Health

## 2022-07-09 ENCOUNTER — Ambulatory Visit (INDEPENDENT_AMBULATORY_CARE_PROVIDER_SITE_OTHER): Payer: PPO

## 2022-07-09 ENCOUNTER — Encounter: Payer: Self-pay | Admitting: Adult Health

## 2022-07-09 VITALS — BP 90/50 | HR 72 | Temp 98.1°F | Ht 62.0 in | Wt 132.8 lb

## 2022-07-09 DIAGNOSIS — I5032 Chronic diastolic (congestive) heart failure: Secondary | ICD-10-CM

## 2022-07-09 DIAGNOSIS — J189 Pneumonia, unspecified organism: Secondary | ICD-10-CM | POA: Diagnosis not present

## 2022-07-09 DIAGNOSIS — R0683 Snoring: Secondary | ICD-10-CM | POA: Diagnosis not present

## 2022-07-09 DIAGNOSIS — J8281 Chronic eosinophilic pneumonia: Secondary | ICD-10-CM

## 2022-07-09 DIAGNOSIS — J849 Interstitial pulmonary disease, unspecified: Secondary | ICD-10-CM

## 2022-07-09 DIAGNOSIS — M542 Cervicalgia: Secondary | ICD-10-CM | POA: Diagnosis not present

## 2022-07-09 DIAGNOSIS — Z9981 Dependence on supplemental oxygen: Secondary | ICD-10-CM | POA: Diagnosis not present

## 2022-07-09 DIAGNOSIS — R260 Ataxic gait: Secondary | ICD-10-CM | POA: Diagnosis not present

## 2022-07-09 DIAGNOSIS — J439 Emphysema, unspecified: Secondary | ICD-10-CM | POA: Diagnosis not present

## 2022-07-09 DIAGNOSIS — R29818 Other symptoms and signs involving the nervous system: Secondary | ICD-10-CM | POA: Diagnosis not present

## 2022-07-09 MED ORDER — PREDNISONE 20 MG PO TABS: 40.0000 mg | ORAL_TABLET | Freq: Every day | ORAL | 1 refills | Status: DC

## 2022-07-09 NOTE — Assessment & Plan Note (Signed)
Snoring, restless sleep, daytime sleepiness all suspicious for underlying sleep apnea.  Patient has a history of COPD and has home oxygen that she uses as needed.  Would recommend a in lab split-night sleep study.  Plan  Patient Instructions  Continue on ANORO 1 puff daily  Albuterol inhaler As needed   Continue on Oxygen 2l/m with activity  as needed to keep O2 sats >88-90%.  Chest xray today.  Mucinex and Tessalon As needed  cough  Activity as tolerated.   Set up for In lab sleep study, split night .  Healthy sleep regimen  Do not drive if sleepy .   Follow up with Dr. Silas Flood in 6 weeks and As needed  -with PFT  Please contact office for sooner follow up if symptoms do not improve or worsen or seek emergency care

## 2022-07-09 NOTE — Assessment & Plan Note (Signed)
Appears compensated- no evidence of volume overload on exam.

## 2022-07-09 NOTE — Progress Notes (Signed)
Remote pacemaker transmission.   

## 2022-07-09 NOTE — Addendum Note (Signed)
Addended by: Vanessa Barbara on: 07/09/2022 03:06 PM   Modules accepted: Orders

## 2022-07-09 NOTE — Patient Instructions (Addendum)
Continue on ANORO 1 puff daily  Albuterol inhaler As needed   Continue on Oxygen 2l/m with activity  as needed to keep O2 sats >88-90%.  Chest xray today.  Mucinex and Tessalon As needed  cough  Activity as tolerated.   Set up for In lab sleep study, split night .  Healthy sleep regimen  Do not drive if sleepy .   Follow up with Dr. Silas Flood in 6 weeks and As needed  -with PFT  Please contact office for sooner follow up if symptoms do not improve or worsen or seek emergency care

## 2022-07-09 NOTE — Progress Notes (Signed)
@Patient  ID: Sheila Morales, female    DOB: 12/09/1950, 72 y.o.   MRN: [id number]  Chief Complaint  Patient presents with   Consult    Referring provider: Elouise Munroe, MD  HPI: 72 year old female former smoker followed for chronic pulmonary infiltrates suspicious for chronic eosinophilic pneumonia (peripheral eosinophilia and resolution of cough and fever with steroids), chronic respiratory failure, COPD, interstitial lung disease suspicion for NSIP Medical history significant for SLE previous on methotrexate and Plaquenil, atrial fibrillation (previously on amiodarone), sick sinus syndrome status post pacemaker and chronic hyponatremia and diastolic heart failure  TEST/EVENTS :  CT chest February 27, 2022 groundglass opacities within both lungs increase in the right upper lobe appear progressive, small right pleural effusion, cardiomegaly, high density material in the distal esophagus questionable reflux or delayed transit   2D echo January 10, 2022 EF 65 to 70%, LVH, normal pulmonary artery systolic pressure, mild to moderate tricuspid valve regurg  PFT 01/2017 moderate obstruction with FEV1 at 57%, ratio 59, FVC 74%, no significant bronchodilator response, decreased diffusing capacity at 54%.  07/09/2022 Sleep consult  Patient presents today for a sleep consult.  Kindly referred by Cardiology , Dr. [name].  Patient complains of loud snoring, restless sleep, feeling tired and sleepy through the daytime.  Typically goes to bed about 10 to 10:30 PM.  Takes about 20 minutes to go to sleep.  CPAP 1-3 times each night.  Gets up in the morning around 8 AM.  Current weight is at 132 pounds with a BMI at 24.  Epworth score is 17 out of 24.  Typically gets sleepy if she sits down to watch TV and afternoon hours and after eating.  Patient says that she feels very tired throughout the day especially when she first wakes up in the morning.  No history of stroke.  Has diastolic heart failure with  preserved EF.  Takes Ambien 5 mg at bedtime for chronic insomnia. Has been on for long time. Says it works for short period of time.  Caffeine intake is 1 cup of coffee daily  On Neurontin 300mg  3 capsules At bedtime for Neuropathy. .  Sleep is very restless, never feels well rested.  Has a dental work with a bridge .   As above patient is followed for suspected chronic eosinophilic pneumonia, COPD, chronic respiratory failure on oxygen and interstitial lung disease. Previous CT chest in 02/2022 showed progressive changes.  Off steroids -some better. Last used in January with increased cough.  Patient denies any fever, discolored mucus, chest pain.  Patient has chronic neck pain.  May be getting a neck surgery later this summer.  Is doing physical therapy twice weekly.    Has Oxygen at home but only uses with activity  Has [employer] ring , APP shows O2 sats avg 95-99%. While sleeping on room air.    No Known Allergies  Immunization History  Administered Date(s) Administered   Fluad Quad(high Dose 65+) 03/29/2019   H1N1 05/31/2008   Influenza Split 02/13/2012   Influenza Whole 01/21/2008, 01/17/2009   Influenza, High Dose Seasonal PF 02/19/2016, 02/05/2018   Influenza,inj,Quad PF,6+ Mos 01/21/2014   Influenza-Unspecified 02/02/2015, 02/03/2017   Pneumococcal Conjugate-13 02/19/2016   Pneumococcal Polysaccharide-23 02/24/2017   Tdap 03/29/2019    Past Medical History:  Diagnosis Date   A-fib ([place])    Acute respiratory failure with hypoxia ([place]) 11/05/2017   Arthritis    CHF (congestive heart failure) (HCC)    Colitis, ischemic ([place])  COPD (chronic obstructive pulmonary disease) (HCC)    External hemorrhoids    H/O: rheumatic fever    IBS (irritable bowel syndrome)    Pacemaker    Personal history colonic adenoma 03/25/2008   06/2007 right sided adenoma and 2 right hyperplastic polyps     Restless leg syndrome    questionable   Systemic lupus erythematosus (HCC)    Tubular  adenoma of colon    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     Tobacco History: Social History   Tobacco Use  Smoking Status Former   Packs/day: 0.25   Years: 30.00   Additional pack years: 0.00   Total pack years: 7.50   Types: Cigarettes   Quit date: 01/21/2020   Years since quitting: 2.4  Smokeless Tobacco Never  Tobacco Comments   smokes "every now and then" 08/24/20 [facility]    Counseling given: Not Answered Tobacco comments: smokes "every now and then" 08/24/20 [facility]    Outpatient Medications Prior to Visit  Medication Sig Dispense Refill   acetaminophen (TYLENOL) 325 MG tablet Take 650 mg by mouth every 6 (six) hours as needed for moderate pain or mild pain.     albuterol (VENTOLIN HFA) 108 (90 Base) MCG/ACT inhaler Inhale 1-2 puffs into the lungs every 6 (six) hours as needed for wheezing or shortness of breath. 8 g 5   apixaban (ELIQUIS) 5 MG TABS tablet Take 1 tablet (5 mg total) by mouth 2 (two) times daily. 180 tablet 1   benzonatate (TESSALON) 200 MG capsule TAKE 1 CAPSULE (200 MG TOTAL) BY MOUTH EVERY 6 (SIX) HOURS AS NEEDED FOR COUGH. 30 capsule 1   calcium carbonate (OS-CAL - DOSED IN MG OF ELEMENTAL CALCIUM) 1250 (500 Ca) MG tablet Take 1 tablet by mouth every evening.     Cholecalciferol (VITAMIN D) 50 MCG (2000 UT) tablet Take 4,000 Units by mouth every evening.     empagliflozin (JARDIANCE) 10 MG TABS tablet Take 1 tablet (10 mg total) by mouth daily before breakfast. 30 tablet 11   fluticasone (FLONASE) 50 MCG/ACT nasal spray SPRAY 2 SPRAYS INTO EACH NOSTRIL EVERY DAY (Patient taking differently: 2 sprays 2 (two) times daily as needed.) 48 mL 3   folic acid (FOLVITE) 1 MG tablet Take 1 mg by mouth every evening.     furosemide (LASIX) 20 MG tablet TAKE 1 TABLET (20 MG) BY MOUTH DAILY, TAKE 1 EXTRA TABLET BY MOUTH (20 MG) AS NEEDED FOR INCREASED SWELLING. 90 tablet 3   gabapentin (NEURONTIN) 300 MG capsule Take 1 capsule (300 mg total) by mouth 3 (three) times daily. 630  capsule 0   metoprolol succinate (TOPROL XL) 25 MG 24 hr tablet Take 1 tablet (25 mg total) by mouth daily. 90 tablet 3   Misc Natural Products (ADVANCED JOINT RELIEF) CAPS Take 1 capsule by mouth daily. Joint Advantage Gold 5x     Multiple Vitamin (MULTIVITAMIN WITH MINERALS) TABS tablet Take 1 tablet by mouth daily.     RESTASIS 0.05 % ophthalmic emulsion Place 1 drop into both eyes 2 (two) times daily.  6   triamcinolone cream (KENALOG) 0.1 % Apply 1 Application topically as needed.     umeclidinium-vilanterol (ANORO ELLIPTA) 62.5-25 MCG/ACT AEPB Inhale 1 puff into the lungs daily. 1 each 0   vitamin C (ASCORBIC ACID) 500 MG tablet Take 500 mg by mouth daily.     vitamin E 180 MG (400 UNITS) capsule Take 400 Units by mouth daily.  zolpidem (AMBIEN) 5 MG tablet TAKE 1 TABLET BY MOUTH AT BEDTIME AS NEEDED FOR SLEEP 30 tablet 5   predniSONE (DELTASONE) 20 MG tablet Take 2 tablets (40 mg total) by mouth daily. As needed if bronchitis recurs. (Patient not taking: Reported on 07/09/2022) 10 tablet 1   No facility-administered medications prior to visit.     Review of Systems:   Constitutional:   No  weight loss, night sweats,  Fevers, chills,  +fatigue, or  lassitude.  HEENT:   No headaches,  Difficulty swallowing,  Tooth/dental problems, or  Sore throat,                No sneezing, itching, ear ache, nasal congestion, post nasal drip,   CV:  No chest pain,  Orthopnea, PND, swelling in lower extremities, anasarca, dizziness, palpitations, syncope.   GI  No heartburn, indigestion, abdominal pain, nausea, vomiting, diarrhea, change in bowel habits, loss of appetite, bloody stools.   Resp:  No chest wall deformity  Skin: no rash or lesions.  GU: no dysuria, change in color of urine, no urgency or frequency.  No flank pain, no hematuria   MS:  No joint pain or swelling.  No decreased range of motion.  No back pain.    Physical Exam  BP (!) 90/50 (BP Location: Left Arm, Patient  Position: Sitting, Cuff Size: Normal) Comment: provider aware.  pt. states runs low in the afternoon  Pulse 72   Temp 98.1 F (36.7 C) (Oral)   Ht 5\' 2"  (1.575 m)   Wt 132 lb 12.8 oz (60.2 kg)   SpO2 93%   BMI 24.29 kg/m   GEN: A/Ox3; pleasant , NAD, well nourished    HEENT:  [place]/AT,  NOSE-clear, THROAT-clear, no lesions, no postnasal drip or exudate noted.   NECK:  Supple w/ fair ROM; no JVD; normal carotid impulses w/o bruits; no thyromegaly or nodules palpated; no lymphadenopathy.    RESP  Clear  P & A; w/o, wheezes/ rales/ or rhonchi. no accessory muscle use, no dullness to percussion  CARD:  RRR, no m/r/g, no peripheral edema, pulses intact, no cyanosis or clubbing.  GI:   Soft & nt; nml bowel sounds; no organomegaly or masses detected.   Musco: Warm bil, no deformities or joint swelling noted.   Neuro: alert, no focal deficits noted.    Skin: Warm, no lesions or rashes    Lab Results:  CBC  BMET    Imaging: No results found.       Latest Ref Rng & Units 02/06/2017    3:09 PM  PFT Results  FVC-Pre L 2.14   FVC-Predicted Pre % 72   FVC-Post L 2.22   FVC-Predicted Post % 74   Pre FEV1/FVC % % 60   Post FEV1/FCV % % 59   FEV1-Pre L 1.28   FEV1-Predicted Pre % 56   FEV1-Post L 1.30   DLCO uncorrected ml/min/mmHg 12.45   DLCO UNC% % 54   DLCO corrected ml/min/mmHg 12.34   DLCO COR %Predicted % 53   DLVA Predicted % 66   TLC L 4.65   TLC % Predicted % 94   RV % Predicted % 107     No results found for: "NITRICOXIDE"      Assessment & Plan:   COPD (chronic obstructive pulmonary disease) (HCC) Currently stable continue on maintenance regimen with Anoro.  Plan  Patient Instructions  Continue on ANORO 1 puff daily  Albuterol inhaler As needed  Continue on Oxygen 2l/m with activity  as needed to keep O2 sats >88-90%.  Chest xray today.  Mucinex and Tessalon As needed  cough  Activity as tolerated.   Set up for In lab sleep study, split  night .  Healthy sleep regimen  Do not drive if sleepy .   Follow up with Dr. Silas Flood in 6 weeks and As needed   Please contact office for sooner follow up if symptoms do not improve or worsen or seek emergency care     Interstitial lung disease ([place]) ILD with progressive changes questionable NSIP plus or minus chronic eosinophilic pneumonia with waxing and waning pulmonary infiltrates.  These have been steroid responsive clinically and radiographically.  Also has peripheral eosinophilia.  Has previously been on methotrexate and amiodarone now is off both of these.  Clinically patient appears to be stable.  Will check chest x-ray today.  Check PFTs on return visit.  And consider if high-resolution CT chest is indicated going forward.  Plan Patient Instructions  Continue on ANORO 1 puff daily  Albuterol inhaler As needed   Continue on Oxygen 2l/m with activity  as needed to keep O2 sats >88-90%.  Chest xray today.  Mucinex and Tessalon As needed  cough  Activity as tolerated.   Set up for In lab sleep study, split night .  Healthy sleep regimen  Do not drive if sleepy .   Follow up with Dr. Silas Flood in 6 weeks and As needed   Please contact office for sooner follow up if symptoms do not improve or worsen or seek emergency care      Snoring Snoring, restless sleep, daytime sleepiness all suspicious for underlying sleep apnea.  Patient has a history of COPD and has home oxygen that she uses as needed.  Would recommend a in lab split-night sleep study.  Plan  Patient Instructions  Continue on ANORO 1 puff daily  Albuterol inhaler As needed   Continue on Oxygen 2l/m with activity  as needed to keep O2 sats >88-90%.  Chest xray today.  Mucinex and Tessalon As needed  cough  Activity as tolerated.   Set up for In lab sleep study, split night .  Healthy sleep regimen  Do not drive if sleepy .   Follow up with Dr. Silas Flood in 6 weeks and As needed  -with PFT  Please contact  office for sooner follow up if symptoms do not improve or worsen or seek emergency care     Chronic diastolic CHF (congestive heart failure) ([place]) Appears compensated- no evidence of volume overload on exam.      Rexene Edison, NP 07/09/2022

## 2022-07-09 NOTE — Assessment & Plan Note (Signed)
Currently stable continue on maintenance regimen with Anoro.  Plan  Patient Instructions  Continue on ANORO 1 puff daily  Albuterol inhaler As needed   Continue on Oxygen 2l/m with activity  as needed to keep O2 sats >88-90%.  Chest xray today.  Mucinex and Tessalon As needed  cough  Activity as tolerated.   Set up for In lab sleep study, split night .  Healthy sleep regimen  Do not drive if sleepy .   Follow up with Dr. Silas Flood in 6 weeks and As needed   Please contact office for sooner follow up if symptoms do not improve or worsen or seek emergency care

## 2022-07-09 NOTE — Progress Notes (Signed)
Has had no covid or flu vaccines.

## 2022-07-09 NOTE — Assessment & Plan Note (Signed)
ILD with progressive changes questionable NSIP plus or minus chronic eosinophilic pneumonia with waxing and waning pulmonary infiltrates.  These have been steroid responsive clinically and radiographically.  Also has peripheral eosinophilia.  Has previously been on methotrexate and amiodarone now is off both of these.  Clinically patient appears to be stable.  Will check chest x-ray today.  Check PFTs on return visit.  And consider if high-resolution CT chest is indicated going forward.  Plan Patient Instructions  Continue on ANORO 1 puff daily  Albuterol inhaler As needed   Continue on Oxygen 2l/m with activity  as needed to keep O2 sats >88-90%.  Chest xray today.  Mucinex and Tessalon As needed  cough  Activity as tolerated.   Set up for In lab sleep study, split night .  Healthy sleep regimen  Do not drive if sleepy .   Follow up with Dr. Silas Flood in 6 weeks and As needed   Please contact office for sooner follow up if symptoms do not improve or worsen or seek emergency care

## 2022-07-12 DIAGNOSIS — R29818 Other symptoms and signs involving the nervous system: Secondary | ICD-10-CM | POA: Diagnosis not present

## 2022-07-12 DIAGNOSIS — M542 Cervicalgia: Secondary | ICD-10-CM | POA: Diagnosis not present

## 2022-07-12 DIAGNOSIS — R260 Ataxic gait: Secondary | ICD-10-CM | POA: Diagnosis not present

## 2022-07-16 DIAGNOSIS — R260 Ataxic gait: Secondary | ICD-10-CM | POA: Diagnosis not present

## 2022-07-16 DIAGNOSIS — R29818 Other symptoms and signs involving the nervous system: Secondary | ICD-10-CM | POA: Diagnosis not present

## 2022-07-16 DIAGNOSIS — M542 Cervicalgia: Secondary | ICD-10-CM | POA: Diagnosis not present

## 2022-07-17 NOTE — Progress Notes (Signed)
Called and spoke with patient, advised of results/recommendations per Tammy Parrett NP.  She verbalized understanding.  Nothing further needed.

## 2022-07-19 ENCOUNTER — Other Ambulatory Visit: Payer: Self-pay | Admitting: Pulmonary Disease

## 2022-07-19 DIAGNOSIS — M542 Cervicalgia: Secondary | ICD-10-CM | POA: Diagnosis not present

## 2022-07-19 DIAGNOSIS — R260 Ataxic gait: Secondary | ICD-10-CM | POA: Diagnosis not present

## 2022-07-19 DIAGNOSIS — R29818 Other symptoms and signs involving the nervous system: Secondary | ICD-10-CM | POA: Diagnosis not present

## 2022-07-23 DIAGNOSIS — R29818 Other symptoms and signs involving the nervous system: Secondary | ICD-10-CM | POA: Diagnosis not present

## 2022-07-23 DIAGNOSIS — R260 Ataxic gait: Secondary | ICD-10-CM | POA: Diagnosis not present

## 2022-07-23 DIAGNOSIS — M542 Cervicalgia: Secondary | ICD-10-CM | POA: Diagnosis not present

## 2022-07-26 DIAGNOSIS — R29818 Other symptoms and signs involving the nervous system: Secondary | ICD-10-CM | POA: Diagnosis not present

## 2022-07-26 DIAGNOSIS — M542 Cervicalgia: Secondary | ICD-10-CM | POA: Diagnosis not present

## 2022-07-26 DIAGNOSIS — R260 Ataxic gait: Secondary | ICD-10-CM | POA: Diagnosis not present

## 2022-07-30 ENCOUNTER — Other Ambulatory Visit: Payer: Self-pay | Admitting: Internal Medicine

## 2022-07-30 DIAGNOSIS — R260 Ataxic gait: Secondary | ICD-10-CM | POA: Diagnosis not present

## 2022-07-30 DIAGNOSIS — R29818 Other symptoms and signs involving the nervous system: Secondary | ICD-10-CM | POA: Diagnosis not present

## 2022-07-30 DIAGNOSIS — M542 Cervicalgia: Secondary | ICD-10-CM | POA: Diagnosis not present

## 2022-08-02 DIAGNOSIS — R260 Ataxic gait: Secondary | ICD-10-CM | POA: Diagnosis not present

## 2022-08-02 DIAGNOSIS — R29818 Other symptoms and signs involving the nervous system: Secondary | ICD-10-CM | POA: Diagnosis not present

## 2022-08-02 DIAGNOSIS — M542 Cervicalgia: Secondary | ICD-10-CM | POA: Diagnosis not present

## 2022-08-05 ENCOUNTER — Encounter: Payer: Self-pay | Admitting: Internal Medicine

## 2022-08-06 ENCOUNTER — Other Ambulatory Visit: Payer: Self-pay

## 2022-08-06 DIAGNOSIS — R29818 Other symptoms and signs involving the nervous system: Secondary | ICD-10-CM | POA: Diagnosis not present

## 2022-08-06 DIAGNOSIS — M542 Cervicalgia: Secondary | ICD-10-CM | POA: Diagnosis not present

## 2022-08-06 DIAGNOSIS — R260 Ataxic gait: Secondary | ICD-10-CM | POA: Diagnosis not present

## 2022-08-06 MED ORDER — FUROSEMIDE 20 MG PO TABS: ORAL_TABLET | ORAL | 3 refills | Status: DC

## 2022-08-09 DIAGNOSIS — M542 Cervicalgia: Secondary | ICD-10-CM | POA: Diagnosis not present

## 2022-08-09 DIAGNOSIS — R29818 Other symptoms and signs involving the nervous system: Secondary | ICD-10-CM | POA: Diagnosis not present

## 2022-08-09 DIAGNOSIS — R260 Ataxic gait: Secondary | ICD-10-CM | POA: Diagnosis not present

## 2022-08-12 ENCOUNTER — Ambulatory Visit (HOSPITAL_COMMUNITY)
Admission: RE | Admit: 2022-08-12 | Discharge: 2022-08-12 | Disposition: A | Discharge status: Home / Self Care | Payer: PPO | Source: Ambulatory Visit | Attending: Cardiology | Admitting: Cardiology

## 2022-08-12 ENCOUNTER — Encounter (HOSPITAL_COMMUNITY): Payer: Self-pay | Admitting: Cardiology

## 2022-08-12 VITALS — BP 102/60 | HR 80 | Wt 134.8 lb

## 2022-08-12 DIAGNOSIS — Z79899 Other long term (current) drug therapy: Secondary | ICD-10-CM | POA: Insufficient documentation

## 2022-08-12 DIAGNOSIS — Z7984 Long term (current) use of oral hypoglycemic drugs: Secondary | ICD-10-CM | POA: Diagnosis not present

## 2022-08-12 DIAGNOSIS — J449 Chronic obstructive pulmonary disease, unspecified: Secondary | ICD-10-CM | POA: Insufficient documentation

## 2022-08-12 DIAGNOSIS — Z950 Presence of cardiac pacemaker: Secondary | ICD-10-CM | POA: Diagnosis not present

## 2022-08-12 DIAGNOSIS — I4819 Other persistent atrial fibrillation: Secondary | ICD-10-CM

## 2022-08-12 DIAGNOSIS — I5033 Acute on chronic diastolic (congestive) heart failure: Secondary | ICD-10-CM | POA: Diagnosis not present

## 2022-08-12 DIAGNOSIS — M329 Systemic lupus erythematosus, unspecified: Secondary | ICD-10-CM | POA: Insufficient documentation

## 2022-08-12 DIAGNOSIS — J8489 Other specified interstitial pulmonary diseases: Secondary | ICD-10-CM | POA: Insufficient documentation

## 2022-08-12 DIAGNOSIS — R0609 Other forms of dyspnea: Secondary | ICD-10-CM

## 2022-08-12 DIAGNOSIS — I5032 Chronic diastolic (congestive) heart failure: Secondary | ICD-10-CM | POA: Diagnosis not present

## 2022-08-12 DIAGNOSIS — Z7901 Long term (current) use of anticoagulants: Secondary | ICD-10-CM | POA: Diagnosis not present

## 2022-08-12 DIAGNOSIS — I110 Hypertensive heart disease with heart failure: Secondary | ICD-10-CM | POA: Insufficient documentation

## 2022-08-12 DIAGNOSIS — I10 Essential (primary) hypertension: Secondary | ICD-10-CM | POA: Diagnosis not present

## 2022-08-12 DIAGNOSIS — I495 Sick sinus syndrome: Secondary | ICD-10-CM | POA: Insufficient documentation

## 2022-08-12 DIAGNOSIS — I4891 Unspecified atrial fibrillation: Secondary | ICD-10-CM | POA: Insufficient documentation

## 2022-08-12 NOTE — Patient Instructions (Addendum)
CONGRATULATIONS YOU HAVE GRADUATED FROM THE [facility]ART FAILURE [facility]   There has been no changes to your medications.  Follow up with Dr. Jacques Navy as scheduled.   Your physician recommends that you schedule a follow-up appointment in: As Needed  If you have any questions or concerns before your next appointment please send [place] a message through [place] or call our office at [phone].    TO LEAVE A MESSAGE FOR THE NURSE SELECT OPTION 2, PLEASE LEAVE A MESSAGE INCLUDING: YOUR NAME DATE OF BIRTH CALL BACK NUMBER REASON FOR CALL**this is important as we prioritize the call backs  YOU WILL RECEIVE A CALL BACK THE SAME DAY AS LONG AS YOU CALL BEFORE 4:00 PM  At the [facility], you and your health needs are our priority. As part of our continuing mission to provide you with exceptional heart care, we have created designated Provider Care Teams. These Care Teams include your primary Cardiologist (physician) and Advanced Practice Providers (APPs- Physician Assistants and Nurse Practitioners) who all work together to provide you with the care you need, when you need it.   You may see any of the following providers on your designated Care Team at your next follow up: Dr Arvilla Meres Dr Marca Ancona Dr. Marcos Eke, NP Robbie Lis, [place] [facility] [place], [place] Brynda Peon, NP Karle Plumber, PharmD   Please be sure to bring in all your medications bottles to every appointment.    Thank you for choosing [facility]

## 2022-08-12 NOTE — Progress Notes (Signed)
ADVANCED HEART FAILURE CLINIC NOTE  Referring Physician:Dr. Jacques Navy MD Primary Care: Hillard Danker, MD Primary Cardiologist: Dr. Jacques Navy MD  HPI: Sheila Morales is a very pleasant 72 y.o. female with COPD, SLE on methotrexate, hypertension, ILD, HFpEF, SSS s/p PPM who presents for initial visit for further evaluation and treatment of heart failure/cardiomyopathy. Sheila Morales medical history dates back to the 62s when she was originally diagnosed with cutaneous Lupus. She has been on hydroxychloroquine 400mg  daily since that time. Her dose was reduced to 200mg  daily in 2020 due to concern with hydroxychloroquine availability during COVID. In early 2022, she underwent placement of PPM for symptomatic bradycardia due to sinus node dysfunction.   She was also hospitalized in November 2022 for encephalopathy believed to be due to hyponatremia and E. Coli bacteremia. During that admission she was found to be in new onset atrial fibrillation and acute HFpEF exacerbation requiring BiPAP and ICU admission. Following this she was admitted in January 2023 with fever/confusion/lethargy. During that admission she was noted to be anemic to 7.3 (on eliquis). 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 Has been closely followed by Dr. Jacques Navy with continued improvement in her functional status. From a heart failure standpoint, patient's husband believes her symptoms are relatively well controlled. He strongly believes that her current weakness and lack of exercise capacity is due to chronic deconditioning. In addition, she is no longer taking plaquenil.   Interval hx:  Since her last appointment, her only complaint today is sinus drainage leading to coughing. She reports that her whole family was sick recently. Otherwise, feels much better since our last appointment. Doing well in physical therapy previously.   Activity  level/exercise tolerance:  NYHA II Orthopnea:  Sleeps on 1-2 pillows Paroxysmal noctural dyspnea:  No Chest pain/pressure:  No/minimal Orthostatic lightheadedness:  No Palpitations:  No Lower extremity edema: No edema Presyncope/syncope:  No  Past Medical History:  Diagnosis Date   A-fib (HCC)    Acute respiratory failure with hypoxia (HCC) 11/05/2017   Arthritis    CHF (congestive heart failure) (HCC)    Colitis, ischemic (HCC)    COPD (chronic obstructive pulmonary disease) (HCC)    External hemorrhoids    H/O: rheumatic fever    IBS (irritable bowel syndrome)    Pacemaker    Personal history colonic adenoma 03/25/2008   06/2007 right sided adenoma and 2 right hyperplastic polyps     Restless leg syndrome    questionable   Systemic lupus erythematosus (HCC)    Tubular adenoma of colon    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     albuterol (VENTOLIN HFA) 108 (90 Base) MCG/ACT inhaler Inhale 1-2 puffs into the lungs every 6 (six) hours as needed for wheezing or shortness of breath. 8 g 5   apixaban (ELIQUIS) 5 MG TABS tablet Take 1 tablet (5 mg total) by mouth 2 (two) times daily. 180 tablet 1   benzonatate (TESSALON) 200 MG capsule TAKE 1 CAPSULE (200 MG TOTAL) BY MOUTH EVERY 6 (SIX) HOURS AS NEEDED FOR COUGH. 30 capsule 1   calcium carbonate (OS-CAL - DOSED IN MG OF ELEMENTAL CALCIUM) 1250 (500 Ca) MG tablet Take 1 tablet by mouth  every evening.     Cholecalciferol (VITAMIN D) 50 MCG (2000 UT) tablet Take 4,000 Units by mouth every evening.     empagliflozin (JARDIANCE) 10 MG TABS tablet Take 1 tablet (10 mg total) by mouth daily before breakfast. 30 tablet 11   fluticasone (FLONASE) 50 MCG/ACT nasal spray SPRAY 2 SPRAYS INTO EACH NOSTRIL EVERY DAY (Patient taking differently: 2 sprays 2  (two) times daily as needed.) 48 mL 3   folic acid (FOLVITE) 1 MG tablet Take 1 mg by mouth every evening.     furosemide (LASIX) 20 MG tablet TAKE 1 TABLET (20 MG) BY MOUTH DAILY, TAKE 1 EXTRA TABLET BY MOUTH (20 MG) AS NEEDED FOR INCREASED SWELLING. 90 tablet 3   gabapentin (NEURONTIN) 300 MG capsule Take 1 capsule (300 mg total) by mouth 3 (three) times daily. 630 capsule 0   metoprolol succinate (TOPROL XL) 25 MG 24 hr tablet Take 1 tablet (25 mg total) by mouth daily. 90 tablet 3   Misc Natural Products (ADVANCED JOINT RELIEF) CAPS Take 1 capsule by mouth daily. Joint Advantage Gold 5x     Multiple Vitamin (MULTIVITAMIN WITH MINERALS) TABS tablet Take 1 tablet by mouth daily.     predniSONE (DELTASONE) 20 MG tablet Take 2 tablets (40 mg total) by mouth daily. As needed if bronchitis recurs. 10 tablet 1   RESTASIS 0.05 % ophthalmic emulsion Place 1 drop into both eyes 2 (two) times daily.  6   triamcinolone cream (KENALOG) 0.1 % Apply 1 Application topically as needed.     umeclidinium-vilanterol (ANORO ELLIPTA) 62.5-25 MCG/ACT AEPB TAKE 1 PUFF BY MOUTH EVERY DAY 60 each 4   vitamin C (ASCORBIC ACID) 500 MG tablet Take 500 mg by mouth daily.     vitamin E 180 MG (400 UNITS) capsule Take 400 Units by mouth daily.     zolpidem (AMBIEN) 5 MG tablet TAKE 1 TABLET BY MOUTH AT BEDTIME AS NEEDED FOR SLEEP 30 tablet 5   No current facility-administered medications for this encounter.    No Known Allergies    Social History   Socioeconomic History   Marital status: Married    Spouse name: Not on file   Number of children: 2   Years of education: 12   Highest education level: Not on file  Occupational History   Occupation: cleaning business   Occupation: caregiver  Tobacco Use   Smoking status: Former    Packs/day: 0.25    Years: 30.00    Additional pack years: 0.00    Total pack years: 7.50    Types: Cigarettes    Quit date: 01/21/2020    Years since quitting: 2.5   Smokeless  tobacco: Never   Tobacco comments:    smokes "every now and then" 08/24/20 [facility]   Vaping Use   Vaping Use: Never used  Substance and Sexual Activity   Alcohol use: Not Currently    Alcohol/week: 5.0 standard drinks of alcohol    Types: 5 Standard drinks or equivalent per week    Comment: 4 out of 7 dyas per husband at bedside   Drug use: No   Sexual activity: Yes  Other Topics Concern   Not on file  Social History Narrative   HSG. Married '71. 1 daughter- '74, '78; 2 grand daughters.Work: International aid/development worker. Lives with husband and mother-in-law is moving in after rehab.       One level home with spouse   Right handed   Caffeine - coffee 2-3 cups  am   Exercise - treadmill    Education - 12th grade   Social Determinants of Health   Financial Resource Strain: Low Risk  (09/21/2021)   Overall Financial Resource Strain (CARDIA)    Difficulty of Paying Living Expenses: Not hard at all  Food Insecurity: No Food Insecurity (02/23/2022)   Hunger Vital Sign    Worried About Running Out of Food in the Last Year: Never true    Ran Out of Food in the Last Year: Never true  Transportation Needs: No Transportation Needs (02/23/2022)   PRAPARE - Administrator, Civil Service (Medical): No    Lack of Transportation (Non-Medical): No  Physical Activity: Inactive (09/21/2021)   Exercise Vital Sign    Days of Exercise per Week: 0 days    Minutes of Exercise per Session: 0 min  Stress: No Stress Concern Present (09/21/2021)   [employer] of Occupational Health - Occupational Stress Questionnaire    Feeling of Stress : Not at all  Social Connections: Moderately Integrated (09/21/2021)   Social Connection and Isolation Panel [NHANES]    Frequency of Communication with Friends and Family: Three times a week    Frequency of Social Gatherings with Friends and Family: Three times a week    Attends Religious Services: 1 to 4 times per year    Active Member of Clubs or Organizations: No     Attends Banker Meetings: Never    Marital Status: Married  Catering manager Violence: Not At Risk (02/23/2022)   Humiliation, Afraid, Rape, and Kick questionnaire    Fear of Current or Ex-Partner: No    Emotionally Abused: No    Physically Abused: No    Sexually Abused: No      Family History  Problem Relation Age of Onset   Stroke Mother        Deceased, 19s   Atrial fibrillation Father    Colon cancer Father    Dementia Sister    Other Sister        Pick's disease, deceased   Healthy Daughter     There were no vitals filed for this visit.   PHYSICAL EXAM: Vitals:   08/12/22 0853  BP: 102/60  Pulse: 80  SpO2: 96%   GENERAL: Well nourished, well developed, and in no apparent distress at rest.  HEENT: Negative for arcus senilis or xanthelasma. There is no scleral icterus.  The mucous membranes are pink and moist.   NECK: Supple, No masses. Normal carotid upstrokes without bruits. No masses or thyromegaly.    CHEST: There are no chest wall deformities. There is no chest wall tenderness. Respirations are unlabored.  Lungs- cta b/l no crackles CARDIAC:  JVP: <8 cm          Normal rate with regular rhythm. No murmurs, rubs or gallops.  Pulses are 2+ and symmetrical in upper and lower extremities. No edema.  ABDOMEN: Soft, non-tender, non-distended. There are no masses or hepatomegaly. There are normal bowel sounds.  EXTREMITIES: Warm and well perfused with no cyanosis, clubbing.  LYMPHATIC: No axillary or supraclavicular lymphadenopathy.  NEUROLOGIC: Patient is oriented x3 with no focal or lateralizing neurologic deficits.  PSYCH: Patients affect is appropriate, there is no evidence of anxiety or depression.  SKIN: Warm and dry; no lesions or wounds.    DATA REVIEW  ECG: Atrial paced, V sensed at 70BPM as per my read  ECHO: 03/22/21: LVEF 60-65%, LVH; Grade III diastolic dysfunction; normal RV function as  per my read.  01/10/22: LVEF 65%, normal RV  function.   CMR:  09/26/2021: 1. Normal biventricular chamber size and function, LVEF 57%, RVEF 58%.  2. There is post contrast delayed myocardial enhancement. Midmyocardial circumferential stripe of delayed enhancement at the base. Midmyocardial delayed enhancement in the lateral wall at the mid ventricle. Findings are nonspecific. Overall, findings may represent hypertensive heart disease. 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  ASSESSMENT & PLAN:  Heart failure with preserved ejection fraction, NYHA III On TTE she appears to be approaching grade III diastolic dysfunction. 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 I had a lengthy discussion with the patient and her husband regarding this diagnosis. Her risk factors include prolonged use of high dose hydroxychloroquine (>20 years of  at least), conduction system pathology and imaging that may suggestive of it. Since she has stopped taking plaquenil, the family agrees that invasive testing (myocardial biopsy) would be unnecessary.  -Doing great since starting jardiance.  -Euvolemic on exam, TTE w/ preserved LVEF. Will graduate from AHF clinic.   2. Symptomatic bradycardia/SSS s/p dcPPM - Interrogation from 8/23 w/ 95% atrial pacing-v sense with no episodes of atrial fibrillation during this period.   3. Atrial fibrillation  - Amiodarone  daily  - Metoprolol  BID - Eliquis  BID  3. COPD / NSIP - well controlled at this point; off prednisone  4. Cutaneous lupus - Plaquenil discontinued in mid to early 2023 - Methotrexate   Follow-up:  PRN  Donna Snooks [facility]

## 2022-08-12 NOTE — Progress Notes (Signed)
ReDS Vest / Clip - 08/12/22 0900       ReDS Vest / Clip   Station Marker A    Ruler Value 25    ReDS Value Range Low volume    ReDS Actual Value 24

## 2022-08-13 ENCOUNTER — Other Ambulatory Visit: Payer: Self-pay | Admitting: Nurse Practitioner

## 2022-08-13 DIAGNOSIS — J3089 Other allergic rhinitis: Secondary | ICD-10-CM

## 2022-08-13 DIAGNOSIS — J040 Acute laryngitis: Secondary | ICD-10-CM

## 2022-08-13 DIAGNOSIS — R29818 Other symptoms and signs involving the nervous system: Secondary | ICD-10-CM | POA: Diagnosis not present

## 2022-08-13 DIAGNOSIS — M542 Cervicalgia: Secondary | ICD-10-CM | POA: Diagnosis not present

## 2022-08-13 DIAGNOSIS — R260 Ataxic gait: Secondary | ICD-10-CM | POA: Diagnosis not present

## 2022-08-15 ENCOUNTER — Ambulatory Visit (INDEPENDENT_AMBULATORY_CARE_PROVIDER_SITE_OTHER): Payer: PPO | Admitting: Pulmonary Disease

## 2022-08-15 DIAGNOSIS — J439 Emphysema, unspecified: Secondary | ICD-10-CM | POA: Diagnosis not present

## 2022-08-15 DIAGNOSIS — R0683 Snoring: Secondary | ICD-10-CM

## 2022-08-15 DIAGNOSIS — Z9981 Dependence on supplemental oxygen: Secondary | ICD-10-CM

## 2022-08-15 LAB — PULMONARY FUNCTION TEST
DL/VA % pred: 79 %
DL/VA: 3.33 ml/min/mmHg/L
DLCO cor % pred: 66 %
DLCO cor: 12.08 ml/min/mmHg
DLCO unc % pred: 66 %
DLCO unc: 12.08 ml/min/mmHg
FEF 25-75 Post: 0.5 L/sec
FEF 25-75 Pre: 0.73 L/sec
FEF2575-%Change-Post: -32 %
FEF2575-%Pred-Post: 29 %
FEF2575-%Pred-Pre: 43 %
FEV1-%Change-Post: -18 %
FEV1-%Pred-Post: 48 %
FEV1-%Pred-Pre: 58 %
FEV1-Post: 0.97 L
FEV1-Pre: 1.19 L
FEV1FVC-%Change-Post: -16 %
FEV1FVC-%Pred-Pre: 87 %
FEV6-%Change-Post: -2 %
FEV6-%Pred-Post: 68 %
FEV6-%Pred-Pre: 70 %
FEV6-Post: 1.76 L
FEV6-Pre: 1.8 L
FEV6FVC-%Change-Post: 0 %
FEV6FVC-%Pred-Post: 104 %
FEV6FVC-%Pred-Pre: 104 %
FVC-%Change-Post: -2 %
FVC-%Pred-Post: 65 %
FVC-%Pred-Pre: 67 %
FVC-Post: 1.77 L
FVC-Pre: 1.81 L
Post FEV1/FVC ratio: 55 %
Post FEV6/FVC ratio: 100 %
Pre FEV1/FVC ratio: 66 %
Pre FEV6/FVC Ratio: 100 %
RV % pred: 95 %
RV: 2.04 L
TLC % pred: 87 %
TLC: 4.17 L

## 2022-08-15 NOTE — Progress Notes (Signed)
Full PFT performed today. °

## 2022-08-15 NOTE — Patient Instructions (Signed)
Full PFT performed today. °

## 2022-08-16 DIAGNOSIS — R260 Ataxic gait: Secondary | ICD-10-CM | POA: Diagnosis not present

## 2022-08-16 DIAGNOSIS — M542 Cervicalgia: Secondary | ICD-10-CM | POA: Diagnosis not present

## 2022-08-16 DIAGNOSIS — R29818 Other symptoms and signs involving the nervous system: Secondary | ICD-10-CM | POA: Diagnosis not present

## 2022-08-19 ENCOUNTER — Encounter: Payer: Self-pay | Admitting: Pulmonary Disease

## 2022-08-19 ENCOUNTER — Ambulatory Visit: Payer: PPO | Admitting: Pulmonary Disease

## 2022-08-19 VITALS — BP 102/62 | HR 64 | Temp 97.7°F | Ht 62.0 in | Wt 136.0 lb

## 2022-08-19 DIAGNOSIS — J849 Interstitial pulmonary disease, unspecified: Secondary | ICD-10-CM | POA: Diagnosis not present

## 2022-08-19 DIAGNOSIS — J439 Emphysema, unspecified: Secondary | ICD-10-CM

## 2022-08-19 MED ORDER — PREDNISONE 20 MG PO TABS: ORAL_TABLET | ORAL | 1 refills | Status: DC

## 2022-08-19 NOTE — Progress Notes (Signed)
@Patient  ID: Sheila Morales, female    DOB: 10-Mar-1951, 72 y.o.   MRN: [id number]  Chief Complaint  Patient presents with   Follow-up    PFT follow up    Referring provider: Myrlene Broker, *  HPI:   72 y.o. 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  Most recent cardiology note reviewed x2.  Recent pulmonary note via NP reviewed.    Seems relatively stable over the last several months.  Sounds like CHF and volume control has been better.  Graduated from advanced heart failure clinic.  Follow-up with traditional cardiology moving forward.  Had PFTs performed in interim.  Largely stable over time.  Slight increase in DLCO, slight decrease in TLC.  Mixed picture.  But overall stable which is reassuring, particular with increase in DLCO.  HPI at initial visit: Briefly, history of infiltrates in the past with area of posterior right upper lobe scarring.  This predated her amiodarone.  Also on methotrexate.  However, was hospitalized in late 2022 with volume overload and groundglass opacities favored to be most likely related to pulmonary edema.  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  She was placed on prednisone taper at discharge.  At the end of her prednisone, she developed dyspnea.  This worsened over the course of a few days.  Developed fever, came to hospital.  Was found to be hypoxemic.  Coarse interstitial infiltrates on chest x-ray on my review and interpretation.  CT chest showed worsening area of fibrotic changes in the posterior right upper lobe as well as scattered groundglass.  At time of evaluation he is on 5 L nasal cannula with accessory muscle use.  Felt too ill to safely tolerate bronchoscopy.  Recommend ongoing diuresis but started prednisone half a milligram per kilogram, 40 mg daily.   After 1 dose her fever subsided, her hypoxemia resolved, her dyspnea exertion went away.  She had peripheral eosinophilia 400-500.  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   Questionaires / Pulmonary Flowsheets:   ACT:      No data to display          MMRC:     No data to display          Epworth:      No data to display          Tests:   FENO:  No results found for: "NITRICOXIDE"  PFT:    Latest Ref Rng & Units 08/15/2022    3:53 PM 02/06/2017    3:09 PM  PFT Results  FVC-Pre L 1.81  P 2.14   FVC-Predicted Pre % 67  P 72   FVC-Post L 1.77  P 2.22   FVC-Predicted Post % 65  P 74   Pre FEV1/FVC % % 66  P 60   Post FEV1/FCV % % 55  P 59   FEV1-Pre L 1.19  P 1.28   FEV1-Predicted Pre % 58  P 56   FEV1-Post L 0.97  P 1.30   DLCO uncorrected ml/min/mmHg 12.08  P 12.45   DLCO UNC% % 66  P 54   DLCO corrected ml/min/mmHg 12.08  P 12.34  DLCO COR %Predicted % 66  P 53   DLVA Predicted % 79  P 66   TLC L 4.17  P 4.65   TLC % Predicted % 87  P 94   RV % Predicted % 95  P 107     P Preliminary result  Personally reviewed and interpreted 2018 PFTs where spirometry is suggestive of moderate obstruction, no bronchodilator response, TLC within normal limits, DLCO moderately reduced 08/15/22 personally reviewed and interpreted as borderline, moderate obstruction, no bronchodilator response, lung volumes within normal limits, DLCO moderately reduced, similar, largely unchanged compared to 2018 in terms of percent predicted etc.  WALK:      No data to display          Imaging: Personally reviewed and as per EMR discussed in this note No results found.  Lab Results: Personally reviewed CBC    Component Value Date/Time   WBC 10.0 03/02/2022 0306   RBC 4.02 03/02/2022 0306   HGB 12.4  03/02/2022 0306   HGB 11.9 05/22/2021 0842   HGB 13.1 01/13/2009 1108   HCT 38.1 03/02/2022 0306   HCT 35.3 05/22/2021 0842   HCT 37.5 01/13/2009 1108   PLT 338 03/02/2022 0306   PLT 194 05/22/2021 0842   MCV 94.8 03/02/2022 0306   MCV 92 05/22/2021 0842   MCV 86.2 01/13/2009 1108   MCH 30.8 03/02/2022 0306   MCHC 32.5 03/02/2022 0306   RDW 15.9 (H) 03/02/2022 0306   RDW 14.5 05/22/2021 0842   RDW 12.2 01/13/2009 1108   LYMPHSABS 1.0 03/02/2022 0306   LYMPHSABS 1.2 05/11/2020 1203   LYMPHSABS 2.2 01/13/2009 1108   MONOABS 1.0 03/02/2022 0306   MONOABS 0.7 01/13/2009 1108   EOSABS 0.0 03/02/2022 0306   EOSABS 0.1 05/11/2020 1203   BASOSABS 0.0 03/02/2022 0306   BASOSABS 0.0 05/11/2020 1203   BASOSABS 0.0 01/13/2009 1108    BMET    Component Value Date/Time   NA 135 (A) 04/17/2022 0000   K 4.3 04/17/2022 0000   CL 100 03/26/2022 0939   CO2 25 03/26/2022 0939   GLUCOSE 77 03/26/2022 0939   BUN 19 04/17/2022 0000   CREATININE 1.2 (A) 04/17/2022 0000   CREATININE 1.11 (H) 03/26/2022 0939   CREATININE 0.91 11/22/2019 1502   CALCIUM 8.9 04/17/2022 0000   GFRNONAA 53 (L) 03/26/2022 0939   GFRAA 89 05/11/2020 1203    BNP    Component Value Date/Time   BNP 705.3 (H) 03/26/2022 0951    ProBNP    Component Value Date/Time   PROBNP 1,033.0 (H) 07/09/2021 1242    Specialty Problems       Pulmonary Problems   COPD (chronic obstructive pulmonary disease) (HCC)   Interstitial lung disease (HCC)   Snoring    No Known Allergies  Immunization History  Administered Date(s) Administered   Fluad Quad(high Dose 65+) 03/29/2019   H1N1 05/31/2008   Influenza Split 02/13/2012   Influenza Whole 01/21/2008, 01/17/2009   Influenza, High Dose Seasonal PF 02/19/2016, 02/05/2018   Influenza,inj,Quad PF,6+ Mos 01/21/2014   Influenza-Unspecified 02/02/2015, 02/03/2017   Pneumococcal Conjugate-13 02/19/2016   Pneumococcal Polysaccharide-23 02/24/2017   Tdap 03/29/2019     Past Medical History:  Diagnosis Date   A-fib (HCC)    Acute respiratory failure with hypoxia (HCC) 11/05/2017   Arthritis    CHF (congestive heart failure) (HCC)    Colitis, ischemic (HCC)    COPD (chronic obstructive pulmonary disease) (HCC)    External hemorrhoids  H/O: rheumatic fever    IBS (irritable bowel syndrome)    Pacemaker    Personal history colonic adenoma 03/25/2008   06/2007 right sided adenoma and 2 right hyperplastic polyps     Restless leg syndrome    questionable   Systemic lupus erythematosus (HCC)    Tubular adenoma of colon    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     Tobacco History: Social History   Tobacco Use  Smoking Status Former   Packs/day: 0.25   Years: 30.00   Additional pack years: 0.00   Total pack years: 7.50   Types: Cigarettes   Quit date: 01/21/2020   Years since quitting: 2.5  Smokeless Tobacco Never  Tobacco Comments   smokes "every now and then" 08/24/20 [facility]    Counseling given: Not Answered Tobacco comments: smokes "every now and then" 08/24/20 [facility]    Continue to not smoke  Outpatient Encounter Medications as of 08/19/2022  Medication Sig   acetaminophen (TYLENOL) 325 MG tablet Take 650 mg by mouth every 6 (six) hours as needed for moderate pain or mild pain.   albuterol (VENTOLIN HFA) 108 (90 Base) MCG/ACT inhaler Inhale 1-2 puffs into the lungs every 6 (six) hours as needed for wheezing or shortness of breath.   apixaban (ELIQUIS) 5 MG TABS tablet Take 1 tablet (5 mg total) by mouth 2 (two) times daily.   benzonatate (TESSALON) 200 MG capsule TAKE 1 CAPSULE (200 MG TOTAL) BY MOUTH EVERY 6 (SIX) HOURS AS NEEDED FOR COUGH.   calcium carbonate (OS-CAL - DOSED IN MG OF ELEMENTAL CALCIUM) 1250 (500 Ca) MG tablet Take 1 tablet by mouth every evening.   Cholecalciferol (VITAMIN D) 50 MCG (2000 UT) tablet Take 4,000 Units by mouth every evening.   empagliflozin (JARDIANCE) 10 MG TABS tablet Take 1 tablet (10 mg total) by mouth  daily before breakfast.   fluticasone (FLONASE) 50 MCG/ACT nasal spray Place 2 sprays into both nostrils as needed for allergies or rhinitis.   folic acid (FOLVITE) 1 MG tablet Take 1 mg by mouth every evening.   furosemide (LASIX) 20 MG tablet TAKE 1 TABLET (20 MG) BY MOUTH DAILY, TAKE 1 EXTRA TABLET BY MOUTH (20 MG) AS NEEDED FOR INCREASED SWELLING.   gabapentin (NEURONTIN) 300 MG capsule Take 900 mg by mouth at bedtime.   metoprolol succinate (TOPROL XL) 25 MG 24 hr tablet Take 1 tablet (25 mg total) by mouth daily.   Misc Natural Products (ADVANCED JOINT RELIEF) CAPS Take 1 capsule by mouth daily. Joint Advantage Gold 5x   Multiple Vitamin (MULTIVITAMIN WITH MINERALS) TABS tablet Take 1 tablet by mouth daily.   RESTASIS 0.05 % ophthalmic emulsion Place 1 drop into both eyes 2 (two) times daily.   triamcinolone cream (KENALOG) 0.1 % Apply 1 Application topically as needed.   umeclidinium-vilanterol (ANORO ELLIPTA) 62.5-25 MCG/ACT AEPB TAKE 1 PUFF BY MOUTH EVERY DAY   vitamin C (ASCORBIC ACID) 500 MG tablet Take 500 mg by mouth daily.   vitamin E 180 MG (400 UNITS) capsule Take 400 Units by mouth daily.   zolpidem (AMBIEN) 5 MG tablet TAKE 1 TABLET BY MOUTH AT BEDTIME AS NEEDED FOR SLEEP   [DISCONTINUED] predniSONE (DELTASONE) 20 MG tablet Take 2 tablets (40 mg total) by mouth daily. As needed if bronchitis recurs.   predniSONE (DELTASONE) 20 MG tablet As needed if bronchitis recurs. Take 40 mg once daily for 5 days.   No facility-administered encounter medications on file as of 08/19/2022.  Review of Systems  Review of Systems  N/a Physical Exam  BP 102/62 (BP Location: Left Arm, Patient Position: Sitting, Cuff Size: Normal)   Pulse 64   Temp 97.7 F (36.5 C) (Oral)   Ht 5\' 2"  (1.575 m)   Wt 136 lb (61.7 kg)   SpO2 99%   BMI 24.87 kg/m   Wt Readings from Last 5 Encounters:  08/19/22 136 lb (61.7 kg)  08/12/22 134 lb 12.8 oz (61.1 kg)  07/09/22 132 lb 12.8 oz (60.2 kg)   06/18/22 133 lb (60.3 kg)  06/17/22 133 lb 12.8 oz (60.7 kg)    BMI Readings from Last 5 Encounters:  08/19/22 24.87 kg/m  08/12/22 24.66 kg/m  07/09/22 24.29 kg/m  06/18/22 24.33 kg/m  06/17/22 24.47 kg/m     Physical Exam General: Sitting in chair, appears drowsy Pulmonary: Mild crackle left base, otherwise clear, good air movement Cardiovascular: Regular in rhythm Neuro: Normal gait, no focal weakness on exam   Assessment & Plan:   Acute hypoxemic respiratory failure with progressive chronic pulmonary infiltrates with worsened fever and cough: Concerning for underlying inflammatory lung disease, favor NSIP given pattern and groundglass opacities on imaging on admission.  Suspect prior infiltrates 03/2021 reflective of this process as well as volume overload given bilateral pleural effusions now improved.  Small area of what looks like fibrosis in the right upper lobe in 03/21/2021.  Overall pattern improved from last admission but with progressive fibrosis particular in the right upper lobe as well as bronchiectasis and bilateral but right greater than left lower lobes.  No clear signs of pulmonary infection on CT scan on my interpretation.  Fear this is early fibrosis and signs of volume loss.  She had fever, productive cough worsening hypoxemia in the setting of steroid taper.  Procalcitonin was negative.  The differential also includes methotrexate lung toxicity although the pattern does not appear classic for this.  In addition, amiodarone toxicity is possible but given initial area of fibrosis predates amiodarone and the relative short duration of amiodarone administration this is felt unlikely.  She had peripheral eosinophilia of 400 on admission.  Marked improvement and essentially resolution of fever, hypoxemia, dyspnea, cough after 1 dose of prednisone 40 mg daily.  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  Gradual improvement in symptoms with  prednisone taper, chest x-ray improving 05/12/2021.  Breathing suddenly worsen later early spring 2023 felt to be largely related to volume overload given elevated BNP and improvement with diuresis.  Subsequent hospitalization 01/2022 again in the setting of volume overload.  Now felt to be euvolemic.  Symptoms relatively stable.  Interstitial lung disease: Upper lobe dominant fibrotic changes, groundglass in the past.  Suspicion for NSIP.  Discussed role and rationale for antifibrotic's additional work-up for possible further immunosuppression.  She has upcoming follow-up with rheumatology.  She deferred antifibrotic's at this time as she feels well, doing as well as she has in many months.  Recent DLCO actually improved since 2018, with slightly worse given all the issues over the last couple of years.  This is encouraging.  Moderate severe COPD: Based on PTs 2018.  Repeat 2024 with improved ratio but still moderate COPD.  Trial Trelegy without real improvement.  Continue Anoro.  Return in about 6 months (around 02/18/2023).   Karren Burly, MD 08/19/2022  I spent 41 minutes in the care of the patient including face-to-face visit, review of records, coordination of care.

## 2022-08-19 NOTE — Patient Instructions (Signed)
Nice to see you again  Continue the good work - keep active as you are!  Continue the Anoro - prednisone was refilled to day - use as needed - please let me know when you use this.  I will send a message about the sleep study.  Return to clinic in 6 months or sooner as needed

## 2022-08-20 DIAGNOSIS — R260 Ataxic gait: Secondary | ICD-10-CM | POA: Diagnosis not present

## 2022-08-20 DIAGNOSIS — M542 Cervicalgia: Secondary | ICD-10-CM | POA: Diagnosis not present

## 2022-08-20 DIAGNOSIS — R29818 Other symptoms and signs involving the nervous system: Secondary | ICD-10-CM | POA: Diagnosis not present

## 2022-08-22 ENCOUNTER — Telehealth: Payer: Self-pay | Admitting: Pulmonary Disease

## 2022-08-22 NOTE — Telephone Encounter (Signed)
Good afternoon,  This patient is awaiting scheduling for an in lab sleep study.  It sounds like in the past she was ordered for home sleep study but since there is a history of her using oxygen this was denied or refused to be scheduled.  Can we look into getting the in lab sleep study/CPAP titration scheduled?  I have CCed Tammy who saw her in the office today it was ordered.  Thank you [employer]

## 2022-08-23 DIAGNOSIS — R260 Ataxic gait: Secondary | ICD-10-CM | POA: Diagnosis not present

## 2022-08-23 DIAGNOSIS — M542 Cervicalgia: Secondary | ICD-10-CM | POA: Diagnosis not present

## 2022-08-23 DIAGNOSIS — R29818 Other symptoms and signs involving the nervous system: Secondary | ICD-10-CM | POA: Diagnosis not present

## 2022-08-26 ENCOUNTER — Ambulatory Visit (INDEPENDENT_AMBULATORY_CARE_PROVIDER_SITE_OTHER): Payer: PPO

## 2022-08-26 DIAGNOSIS — I495 Sick sinus syndrome: Secondary | ICD-10-CM

## 2022-08-26 LAB — CUP PACEART REMOTE DEVICE CHECK
Battery Remaining Percentage: 80 %
Brady Statistic RA Percent Paced: 99 %
Brady Statistic RV Percent Paced: 0 %
Date Time Interrogation Session: 20240506110725
Implantable Lead Connection Status: 753985
Implantable Lead Connection Status: 753985
Implantable Lead Implant Date: 20220204
Implantable Lead Implant Date: 20220204
Implantable Lead Location: 753859
Implantable Lead Location: 753860
Implantable Lead Model: 377169
Implantable Lead Model: 377171
Implantable Lead Serial Number: 8000143637
Implantable Lead Serial Number: 8000151447
Implantable Pulse Generator Implant Date: 20220204
Lead Channel Impedance Value: 468 Ohm
Lead Channel Impedance Value: 605 Ohm
Lead Channel Pacing Threshold Amplitude: 0.8 V
Lead Channel Pacing Threshold Amplitude: 0.8 V
Lead Channel Pacing Threshold Pulse Width: 0.4 ms
Lead Channel Pacing Threshold Pulse Width: 0.4 ms
Lead Channel Sensing Intrinsic Amplitude: 0.9 mV
Lead Channel Sensing Intrinsic Amplitude: 10 mV
Lead Channel Setting Pacing Amplitude: 3 V
Lead Channel Setting Pacing Amplitude: 3 V
Lead Channel Setting Pacing Pulse Width: 0.4 ms
Pulse Gen Model: 407145
Pulse Gen Serial Number: 70020732

## 2022-08-26 NOTE — Telephone Encounter (Signed)
Sent message to [facility] to check and she has sent to [place].

## 2022-08-27 DIAGNOSIS — M542 Cervicalgia: Secondary | ICD-10-CM | POA: Diagnosis not present

## 2022-08-27 DIAGNOSIS — R29818 Other symptoms and signs involving the nervous system: Secondary | ICD-10-CM | POA: Diagnosis not present

## 2022-08-27 DIAGNOSIS — R260 Ataxic gait: Secondary | ICD-10-CM | POA: Diagnosis not present

## 2022-08-28 ENCOUNTER — Telehealth: Payer: Self-pay | Admitting: *Deleted

## 2022-08-28 NOTE — Telephone Encounter (Signed)
   Pre-operative Risk Assessment    Patient Name: Sheila Morales  DOB: June 06, 1950 MRN: [id number]      Request for Surgical Clearance    Procedure:   C4-5,C5-6 Anterior Cervical Fusion  Date of Surgery:  Clearance TBD                                 Surgeon:  Dr. Donalee Citrin Surgeon's Group or Practice Name:  [facility] Phone number:  [phone] Fax number:  [phone]   Type of Clearance Requested:   - Medical  - Pharmacy:  Hold Apixaban (Eliquis) Not Indicated   Type of Anesthesia:  General    Additional requests/questions:    Signed, Emmit Pomfret   08/28/2022, 7:22 AM

## 2022-08-29 NOTE — Telephone Encounter (Signed)
   Patient Name: Sheila Morales  DOB: [birth year] MRN: [id number]  Primary Cardiologist: [name], MD  Chart reviewed as part of pre-operative protocol coverage. Pre-op clearance already addressed by colleagues in earlier phone notes. To summarize recommendations:  -From a cardiac standpoint, she has normal LV function. Low risk. Agree with holding apixaban.  -Dr. [name]  Per office protocol, patient can hold Eliquis for 3 days prior to procedure.  Please restart when medically safe to do so.  Will route this bundled recommendation to requesting provider via Epic fax function and remove from pre-op pool. Please call with questions.  Sharlene Dory, PA-C 08/29/2022, 4:29 PM

## 2022-08-29 NOTE — Telephone Encounter (Signed)
Patient with diagnosis of afib on Eliquis for anticoagulation.    Procedure: C4-5,C5-6 Anterior Cervical Fusion  Date of procedure: TBD  CHA2DS2-VASc Score = 4  This indicates a 4.8% annual risk of stroke. The patient's score is based upon: CHF History: 1 HTN History: 1 Diabetes History: 0 Stroke History: 0 Vascular Disease History: 0 Age Score: 1 Gender Score: 1  CrCl 15mL/min Platelet count 338K  Per office protocol, patient can hold Eliquis for 3 days prior to procedure.    **This guidance is not considered finalized until pre-operative APP has relayed final recommendations.**

## 2022-08-30 DIAGNOSIS — R260 Ataxic gait: Secondary | ICD-10-CM | POA: Diagnosis not present

## 2022-08-30 DIAGNOSIS — R29818 Other symptoms and signs involving the nervous system: Secondary | ICD-10-CM | POA: Diagnosis not present

## 2022-08-30 DIAGNOSIS — M542 Cervicalgia: Secondary | ICD-10-CM | POA: Diagnosis not present

## 2022-09-02 ENCOUNTER — Other Ambulatory Visit: Payer: Self-pay | Admitting: Neurosurgery

## 2022-09-02 NOTE — Telephone Encounter (Signed)
Have we gotten approval

## 2022-09-03 DIAGNOSIS — R260 Ataxic gait: Secondary | ICD-10-CM | POA: Diagnosis not present

## 2022-09-03 DIAGNOSIS — M542 Cervicalgia: Secondary | ICD-10-CM | POA: Diagnosis not present

## 2022-09-03 DIAGNOSIS — R29818 Other symptoms and signs involving the nervous system: Secondary | ICD-10-CM | POA: Diagnosis not present

## 2022-09-03 NOTE — Telephone Encounter (Signed)
I scheduled pt for 6/3 and called and gave her appt info.  Nothing further needed.

## 2022-09-06 DIAGNOSIS — R29818 Other symptoms and signs involving the nervous system: Secondary | ICD-10-CM | POA: Diagnosis not present

## 2022-09-06 DIAGNOSIS — M542 Cervicalgia: Secondary | ICD-10-CM | POA: Diagnosis not present

## 2022-09-06 DIAGNOSIS — R260 Ataxic gait: Secondary | ICD-10-CM | POA: Diagnosis not present

## 2022-09-09 ENCOUNTER — Encounter: Payer: Self-pay | Admitting: Internal Medicine

## 2022-09-09 NOTE — Progress Notes (Signed)
Called [employer] to make local representative aware of patient's upcoming surgery. Left information with answering service.   [employer]  [address]  [place], [place]  [address], [place]   Tel: [phone]  Also sent request for orders from Dr. Ladona Ridgel

## 2022-09-09 NOTE — Progress Notes (Signed)
PERIOPERATIVE PRESCRIPTION FOR IMPLANTED CARDIAC DEVICE PROGRAMMING  Patient Information: Name:  Sheila Morales  DOB:  Apr 21, 1951  MRN:  [id number]  Planned Procedure:  ACDF  Surgeon:  Wynetta Emery  Date of Procedure:  09/18/22  Cautery will be used.  Position during surgery:  Supine  Device Information:  Clinic EP Physician:  Lewayne Bunting, MD   Device Type:  Pacemaker Manufacturer and Phone #:  [employer]: [phone] Pacemaker Dependent?:  Yes.   Date of Last Device Check:  08/26/2022 Normal Device Function?:  Yes.    Electrophysiologist's Recommendations:  Have magnet available. Provide continuous ECG monitoring when magnet is used or reprogramming is to be performed.  Procedure will likely interfere with device function.  Device should be programmed:  Asynchronous pacing during procedure and returned to normal programming after procedure  Per Device Clinic Standing Orders, Wiliam Ke, RN  11:01 AM 09/09/2022

## 2022-09-09 NOTE — Progress Notes (Addendum)
Surgical Instructions    Your procedure is scheduled on Wednesday May 29th.  Report to [facility] Main Entrance "A" at 5:30 A.M., then check in with the Admitting office.  Call this number if you have problems the morning of surgery:  [phone]   If you have any questions prior to your surgery date call [phone]: Open Monday-Friday 8am-4pm If you experience any cold or flu symptoms such as cough, fever, chills, shortness of breath, etc. between now and your scheduled surgery, please notify us at the above number     Remember:  Do not eat or drink after midnight the night before your surgery   Take these medicines the morning of surgery with A SIP OF WATER: metoprolol succinate (TOPROL XL) 25 MG 24 hr tablet  RESTASIS 0.05 % ophthalmic emulsion   IF NEEDED acetaminophen (TYLENOL) 325 MG tablet  albuterol (VENTOLIN HFA) 108 (90 Base) MCG/ACT inhaler - please bring with you to the hospital benzonatate (TESSALON) 200 MG capsule   Please hold your Jardiance 72 hours (3 days) prior to surgery.  Follow your surgeon's instructions on when to stop Eliquis.  If no instructions were given by your surgeon then you will need to call the office to get those instructions.    As of today, STOP taking any Aspirin (unless otherwise instructed by your surgeon) Aleve, Naproxen, Ibuprofen, Motrin, Advil, Goody's, BC's, all herbal medications, fish oil, and all vitamins.           Do not wear jewelry or makeup. Do not wear lotions, powders, perfumes or deodorant. Do not shave 48 hours prior to surgery.   Do not bring valuables to the hospital. Do not wear nail polish, gel polish, artificial nails, or any other type of covering on natural nails (fingers and toes) If you have artificial nails or gel coating that need to be removed by a nail salon, please have this removed prior to surgery. Artificial nails or gel coating may interfere with anesthesia's ability to adequately monitor your vital  signs.  [facility] is not responsible for any belongings or valuables.    Do NOT Smoke (Tobacco/Vaping)  24 hours prior to your procedure  If you use a CPAP at night, you may bring your mask for your overnight stay.   Contacts, glasses, hearing aids, dentures or partials may not be worn into surgery, please bring cases for these belongings   For patients admitted to the hospital, discharge time will be determined by your treatment team.   Patients discharged the day of surgery will not be allowed to drive home, and someone needs to stay with them for 24 hours.   SURGICAL WAITING ROOM VISITATION Patients having surgery or a procedure may have no more than 2 support people in the waiting area - these visitors may rotate.   Children under the age of 11 must have an adult with them who is not the patient. If the patient needs to stay at the hospital during part of their recovery, the visitor guidelines for inpatient rooms apply. Pre-op nurse will coordinate an appropriate time for 1 support person to accompany patient in pre-op.  This support person may not rotate.   Please refer to [email or web address] for the visitor guidelines for Inpatients (after your surgery is over and you are in a regular room).    Special instructions:    Oral Hygiene is also important to reduce your risk of infection.  Remember - BRUSH YOUR TEETH THE MORNING OF SURGERY WITH YOUR  REGULAR TOOTHPASTE   [place]- Preparing For Surgery  Before surgery, you can play an important role. Because skin is not sterile, your skin needs to be as free of germs as possible. You can reduce the number of germs on your skin by washing with CHG (chlorahexidine gluconate) Soap before surgery.  CHG is an antiseptic cleaner which kills germs and bonds with the skin to continue killing germs even after washing.     Please do not use if you have an allergy to CHG or antibacterial  soaps. If your skin becomes reddened/irritated stop using the CHG.  Do not shave (including legs and underarms) for at least 48 hours prior to first CHG shower. It is OK to shave your face.  Please follow these instructions carefully.      Pre-operative 5 CHG Bath Instructions   You can play a key role in reducing the risk of infection after surgery. Your skin needs to be as free of germs as possible. You can reduce the number of germs on your skin by washing with CHG (chlorhexidine gluconate) soap before surgery. CHG is an antiseptic soap that kills germs and continues to kill germs even after washing.   DO NOT use if you have an allergy to chlorhexidine/CHG or antibacterial soaps. If your skin becomes reddened or irritated, stop using the CHG and notify one of our RNs at [phone].   Please shower with the CHG soap starting 4 days before surgery using the following schedule:     Please keep in mind the following:  DO NOT shave, including legs and underarms, starting the day of your first shower.   You may shave your face at any point before/day of surgery.  Place clean sheets on your bed the day you start using CHG soap. Use a clean washcloth (not used since being washed) for each shower. DO NOT sleep with pets once you start using the CHG.   CHG Shower Instructions:  If you choose to wash your hair and private area, wash first with your normal shampoo/soap.  After you use shampoo/soap, rinse your hair and body thoroughly to remove shampoo/soap residue.  Turn the water OFF and apply about 3 tablespoons (45 ml) of CHG soap to a CLEAN washcloth.  Apply CHG soap ONLY FROM YOUR NECK DOWN TO YOUR TOES (washing for 3-5 minutes)  DO NOT use CHG soap on face, private areas, open wounds, or sores.  Pay special attention to the area where your surgery is being performed.  If you are having back surgery, having someone wash your back for you may be helpful. Wait 2 minutes after CHG soap is  applied, then you may rinse off the CHG soap.  Pat dry with a clean towel  Put on clean clothes/pajamas   If you choose to wear lotion, please use ONLY the CHG-compatible lotions on the back of this paper.     Additional instructions for the day of surgery: DO NOT APPLY any lotions, deodorants, cologne, or perfumes.   Put on clean/comfortable clothes.  Brush your teeth.  Ask your nurse before applying any prescription medications to the skin.      CHG Compatible Lotions   Aveeno Moisturizing lotion  Cetaphil Moisturizing Cream  Cetaphil Moisturizing Lotion  Clairol Herbal Essence Moisturizing Lotion, Dry Skin  Clairol Herbal Essence Moisturizing Lotion, Extra Dry Skin  Clairol Herbal Essence Moisturizing Lotion, Normal Skin  Curel Age Defying Therapeutic Moisturizing Lotion with Alpha Hydroxy  Curel Extreme Care Body Lotion  Curel Soothing Hands Moisturizing Hand Lotion  Curel Therapeutic Moisturizing Cream, Fragrance-Free  Curel Therapeutic Moisturizing Lotion, Fragrance-Free  Curel Therapeutic Moisturizing Lotion, Original Formula  Eucerin Daily Replenishing Lotion  Eucerin Dry Skin Therapy Plus Alpha Hydroxy Crme  Eucerin Dry Skin Therapy Plus Alpha Hydroxy Lotion  Eucerin Original Crme  Eucerin Original Lotion  Eucerin Plus Crme Eucerin Plus Lotion  Eucerin TriLipid Replenishing Lotion  Keri Anti-Bacterial Hand Lotion  Keri Deep Conditioning Original Lotion Dry Skin Formula Softly Scented  Keri Deep Conditioning Original Lotion, Fragrance Free Sensitive Skin Formula  Keri Lotion Fast Absorbing Fragrance Free Sensitive Skin Formula  Keri Lotion Fast Absorbing Softly Scented Dry Skin Formula  Keri Original Lotion  Keri Skin Renewal Lotion Keri Silky Smooth Lotion  Keri Silky Smooth Sensitive Skin Lotion  Nivea Body Creamy Conditioning Oil  Nivea Body Extra Enriched Lotion  Nivea Body Original Lotion  Nivea Body Sheer Moisturizing Lotion Nivea Crme  Nivea Skin  Firming Lotion  NutraDerm 30 Skin Lotion  NutraDerm Skin Lotion  NutraDerm Therapeutic Skin Cream  NutraDerm Therapeutic Skin Lotion  ProShield Protective Hand Cream  Provon moisturizing lotion   Day of Surgery:  Take a shower with CHG soap. Wear Clean/Comfortable clothing the morning of surgery Do not apply any deodorants/lotions.   Remember to brush your teeth WITH YOUR REGULAR TOOTHPASTE.    If you received a COVID test during your pre-op visit, it is requested that you wear a mask when out in public, stay away from anyone that may not be feeling well, and notify your surgeon if you develop symptoms. If you have been in contact with anyone that has tested positive in the last 10 days, please notify your surgeon.    Please read over the following fact sheets that you were given.

## 2022-09-10 ENCOUNTER — Other Ambulatory Visit: Payer: Self-pay

## 2022-09-10 ENCOUNTER — Encounter (HOSPITAL_COMMUNITY): Payer: Self-pay

## 2022-09-10 ENCOUNTER — Encounter (HOSPITAL_COMMUNITY)
Admission: RE | Admit: 2022-09-10 | Discharge: 2022-09-10 | Disposition: A | Discharge status: Home / Self Care | Payer: PPO | Source: Ambulatory Visit | Attending: Neurosurgery | Admitting: Neurosurgery

## 2022-09-10 VITALS — BP 117/95 | HR 66 | Temp 97.7°F | Resp 18 | Ht 62.0 in | Wt 133.2 lb

## 2022-09-10 DIAGNOSIS — J849 Interstitial pulmonary disease, unspecified: Secondary | ICD-10-CM | POA: Insufficient documentation

## 2022-09-10 DIAGNOSIS — I5032 Chronic diastolic (congestive) heart failure: Secondary | ICD-10-CM | POA: Diagnosis not present

## 2022-09-10 DIAGNOSIS — J449 Chronic obstructive pulmonary disease, unspecified: Secondary | ICD-10-CM | POA: Diagnosis not present

## 2022-09-10 DIAGNOSIS — R260 Ataxic gait: Secondary | ICD-10-CM | POA: Diagnosis not present

## 2022-09-10 DIAGNOSIS — Z87891 Personal history of nicotine dependence: Secondary | ICD-10-CM | POA: Insufficient documentation

## 2022-09-10 DIAGNOSIS — M542 Cervicalgia: Secondary | ICD-10-CM | POA: Insufficient documentation

## 2022-09-10 DIAGNOSIS — Z01818 Encounter for other preprocedural examination: Secondary | ICD-10-CM

## 2022-09-10 DIAGNOSIS — Z01812 Encounter for preprocedural laboratory examination: Secondary | ICD-10-CM | POA: Diagnosis not present

## 2022-09-10 DIAGNOSIS — I10 Essential (primary) hypertension: Secondary | ICD-10-CM

## 2022-09-10 DIAGNOSIS — I4891 Unspecified atrial fibrillation: Secondary | ICD-10-CM | POA: Diagnosis not present

## 2022-09-10 DIAGNOSIS — I495 Sick sinus syndrome: Secondary | ICD-10-CM | POA: Insufficient documentation

## 2022-09-10 DIAGNOSIS — M329 Systemic lupus erythematosus, unspecified: Secondary | ICD-10-CM | POA: Diagnosis not present

## 2022-09-10 DIAGNOSIS — R29818 Other symptoms and signs involving the nervous system: Secondary | ICD-10-CM | POA: Diagnosis not present

## 2022-09-10 DIAGNOSIS — I110 Hypertensive heart disease with heart failure: Secondary | ICD-10-CM | POA: Insufficient documentation

## 2022-09-10 DIAGNOSIS — R7989 Other specified abnormal findings of blood chemistry: Secondary | ICD-10-CM | POA: Insufficient documentation

## 2022-09-10 HISTORY — DX: Cervicalgia: M54.2

## 2022-09-10 LAB — CBC
HCT: 42.5 % (ref 36.0–46.0)
Hemoglobin: 13.9 g/dL (ref 12.0–15.0)
MCH: 29.8 pg (ref 26.0–34.0)
MCHC: 32.7 g/dL (ref 30.0–36.0)
MCV: 91.2 fL (ref 80.0–100.0)
Platelets: 186 10*3/uL (ref 150–400)
RBC: 4.66 MIL/uL (ref 3.87–5.11)
RDW: 13.6 % (ref 11.5–15.5)
WBC: 5.9 10*3/uL (ref 4.0–10.5)
nRBC: 0 % (ref 0.0–0.2)

## 2022-09-10 LAB — BASIC METABOLIC PANEL
Anion gap: 12 (ref 5–15)
BUN: 23 mg/dL (ref 8–23)
CO2: 23 mmol/L (ref 22–32)
Calcium: 9.3 mg/dL (ref 8.9–10.3)
Chloride: 99 mmol/L (ref 98–111)
Creatinine, Ser: 1.29 mg/dL — ABNORMAL HIGH (ref 0.44–1.00)
GFR, Estimated: 44 mL/min — ABNORMAL LOW (ref >60)
Glucose, Bld: 92 mg/dL (ref 70–99)
Potassium: 3.7 mmol/L (ref 3.5–5.1)
Sodium: 134 mmol/L — ABNORMAL LOW (ref 135–145)

## 2022-09-10 LAB — HEPATIC FUNCTION PANEL
ALT: 84 U/L — ABNORMAL HIGH (ref 0–44)
AST: 87 U/L — ABNORMAL HIGH (ref 15–41)
Albumin: 3.4 g/dL — ABNORMAL LOW (ref 3.5–5.0)
Alkaline Phosphatase: 76 U/L (ref 38–126)
Bilirubin, Direct: 0.2 mg/dL (ref 0.0–0.2)
Indirect Bilirubin: 0.3 mg/dL (ref 0.3–0.9)
Total Bilirubin: 0.5 mg/dL (ref 0.3–1.2)
Total Protein: 7.3 g/dL (ref 6.5–8.1)

## 2022-09-10 LAB — SURGICAL PCR SCREEN
MRSA, PCR: NEGATIVE
Staphylococcus aureus: NEGATIVE

## 2022-09-10 NOTE — Progress Notes (Signed)
PCP - Hillard Danker Cardiologist - Dr. Jacques Navy clearance in [facility]  PPM/ICD - Pacer Biotronik Device Orders - Received from Dr. Ladona Ridgel in shadow chart Rep Notified - Yvone Neu notified with surgery information on 09/09/22  Chest x-ray - 07/09/22 EKG - 05/10/22 Stress Test - 08/28/21 ECHO - 01/10/22 Cardiac Cath - Denies  Sleep Study - Scheduled for June  DM - Denies  Blood Thinner Instructions: Eliquis per Cardiology patient to hold for 3 days prior to procedure, patient aware. Aspirin Instructions: N/A  COVID TEST- N/I   Anesthesia review: Yes cardiac clearance  Patient denies shortness of breath, fever, cough and chest pain at PAT appointment   All instructions explained to the patient, with a verbal understanding of the material. Patient agrees to go over the instructions while at home for a better understanding.  The opportunity to ask questions was provided.

## 2022-09-11 NOTE — Progress Notes (Signed)
Anesthesia Chart Review:  Case: [id number] Date/Time: 09/18/22 0716   Procedure: ACDF C4-C5 - C5-C6   Anesthesia type: General   Pre-op diagnosis: Cervicalgia   [facility] RO[facility] / [facility] OR   Surgeons: Donalee Citrin, MD       DISCUSSION: Patient is a 72 year old female scheduled for the above procedure.  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 [name] was on 08/12/22. 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 Doing well on Jardiance. Euvolic on exam. LVEF preserved. No afib on PPM interrogation. He felt she could graduate from the [facility]HF Clinic.   Last visit with primary cardiologist Dr. Jacques Navy was on 06/17/22 and with EP cardiologist Dr. Ladona Ridgel on 05/10/22. Normal PET CT cardiac perfusion study in May 2023. Pulmonology and HF clinic following for DOE. She was referred for a sleep study which is reportedly scheduled for June 2024. Normal PPM function.  Device Information: Clinic EP Physician:  Lewayne Bunting, MD  Device Type:  Pacemaker Manufacturer and Phone #:  Biotronik: [phone] Pacemaker Dependent?:  Yes.   Date of Last Device Check:  08/26/2022         Normal Device Function?:  Yes.      Electrophysiologist's Recommendations: Have magnet available. Provide continuous ECG monitoring when magnet is used or reprogramming is to be performed.  Procedure will likely interfere with device function.  Device should be programmed:  Asynchronous pacing during procedure and returned to normal programming after procedure   Preoperative cardiology input outlined by Jari Favre, PA-C on 08/29/22, "-From a cardiac standpoint, she has normal LV function. Low risk. Agree with holding apixaban.  -Dr. [name]   Per office protocol, patient can hold Eliquis for 3 days prior to procedure.  Please restart when medically safe to do so." Patient aware of hold Eliqus for 3 days prior to surgery.    Last visit with pulmonologist Dr. Judeth Horn was on 08/19/22. OVerall felt "relatively stable over the last several months." PFTs showed moderate obstruction, no bronchodilator response, lung volumes normal, DLCO moderately reduced. Findings "largely unchanged compared to 2018 in terms of percent predicted etc." Anti-fibrotics may be considered given suspicion for NSIP, but deferred as symptomatically feeling well and waiting follow-up with rheumatology. Continue Anoro for moderate to severe COPD. Six month follow-up planned. She is not currently on prednisone. I spoke with Erie Noe at Dr. Lonie Peak office about notifying pulmonology about surgery plans.   She had progressive elevation in LFTs, primarily since 03/2022 with peak AST 200 and ALT 299 on 06/05/22. Medication induced suspected. Methotrexate and amiodarone held. HFP added to PAT labs and showed improvement in AST to 87 and ALT to 84. CBC normal. Creatinine overall stable at 1.29.    VS: BP (!) 117/95   Pulse 66   Temp 36.5 C (Oral)   Resp 18   Ht 5\' 2"  (1.575 m)  Wt 60.4 kg   SpO2 100%   BMI 24.36 kg/m    PROVIDERS: Myrlene Broker, MD is PCP  Weston Brass, MD is cardiologist, Lewayne Bunting, MD is EP cardiologist Dorthula Nettles,  DO is HF cardiologist Vilma Meckel, MD Levy Pupa, MD is pulmonologist Zenovia Jordan, MD is rheumatologist   LABS: Preoperative labs noted. See DISCUSSION. (all labs ordered are listed, but only abnormal results are displayed)  Labs Reviewed  BASIC METABOLIC PANEL - Abnormal; Notable for the following components:      Result Value   Sodium 134 (*)    Creatinine, Ser 1.29 (*)    GFR, Estimated 44 (*)    All other components within normal limits  HEPATIC FUNCTION PANEL - Abnormal; Notable for the following components:   Albumin 3.4 (*)    AST 87 (*)    ALT 84 (*)    All other components within normal limits  SURGICAL PCR SCREEN  CBC        Latest Ref Rng & Units 08/15/2022    3:53 PM 02/06/2017    3:09 PM  PFT Results  FVC-Pre L 1.81  P 2.14   FVC-Predicted Pre % 67  P 72   FVC-Post L 1.77  P 2.22   FVC-Predicted Post % 65  P 74   Pre FEV1/FVC % % 66  P 60   Post FEV1/FCV % % 55  P 59   FEV1-Pre L 1.19  P 1.28   FEV1-Predicted Pre % 58  P 56   FEV1-Post L 0.97  P 1.30   DLCO uncorrected ml/min/mmHg 12.08  P 12.45   DLCO UNC% % 66  P 54   DLCO corrected ml/min/mmHg 12.08  P 12.34   DLCO COR %Predicted % 66  P 53   DLVA Predicted % 79  P 66   TLC L 4.17  P 4.65   TLC % Predicted % 87  P 94   RV % Predicted % 95  P 107   Reviewed by Dr. Judeth Horn, "08/15/22 personally reviewed and interpreted as borderline, moderate obstruction, no bronchodilator response, lung volumes within normal limits, DLCO moderately reduced, similar, largely unchanged compared to 2018 in terms of percent predicted etc."    IMAGES: CXR 07/09/22: IMPRESSION: Persisting though improved reticulonodular opacities of the bilateral lungs when compared to 03/27/2022, most compatible with resolving infection/inflammation superimposed on chronic changes.   CTA Chest 02/27/22: IMPRESSION: 1. Allowing for motion artifact, no pulmonary embolus. 2. Ground-glass opacities within both lungs, most  prominent involving the right upper lobe. Opacities have been present on prior exams, but progressed currently. Waxing and waning atypical infection, interstitial lung disease or less likely asymmetric pulmonary edema are considered. 3. Small right pleural effusion. Mild multi chamber cardiomegaly. Contrast refluxing into the hepatic veins and IVC consistent with elevated right heart pressures. 4. Mild dilatation of the main pulmonary artery suggesting pulmonary arterial hypertension. 5. High-density material within the distal esophagus and proximal stomach suggesting ingested material, possible reflux or delayed transit. - Aortic Atherosclerosis (ICD10-I70.0) and Emphysema (ICD10-J43.9).   EKG: 05/10/2022: Atrial paced rhythm.  Nonspecific ST and T wave abnormality.   CV: Echocardiogram 01/10/2022: IMPRESSIONS   1. Left ventricular ejection fraction, by estimation, is 65 to 70%. Left  ventricular ejection fraction by 3D volume is 63 %. The left ventricle has  normal function. The left ventricle has no regional wall motion  abnormalities. There is mild left  ventricular hypertrophy. Left ventricular diastolic parameters are  indeterminate.  2. Right ventricular free wall strain is normal at -25.8%. Right  ventricular systolic function is normal. The right ventricular size is  normal. There is normal pulmonary artery systolic pressure. The estimated  right ventricular systolic pressure is 26.6   mmHg.   3. The mitral valve is grossly normal. Mild to moderate mitral valve  regurgitation. No evidence of mitral stenosis.   4. Tricuspid valve regurgitation is mild to moderate.   5. The aortic valve is tricuspid. There is mild calcification of the  aortic valve. Aortic valve regurgitation is not visualized. No aortic  stenosis is present.   6. The inferior vena cava is normal in size with greater than 50%  respiratory variability, suggesting right atrial pressure of 3 mmHg.    NM  PET CT Cardiac Perfusion Study 08/28/21:   LV perfusion is normal. There is no evidence of ischemia. There is no evidence of infarction.   Rest left ventricular function is normal. Rest EF: 63 %. Stress left ventricular function is normal. Stress EF: 73 %. End diastolic cavity size is normal. End systolic cavity size is normal. Abnormal septal motion at rest and stress secondary to ventricular pacing.   Myocardial blood flow was computed to be 1.74ml/g/min at rest and 2.49ml/g/min at stress. Global myocardial blood flow reserve was 2.04 and was normal.   Coronary calcium was present on the attenuation correction CT images. Moderate coronary calcifications were present. Coronary calcifications were present in the left anterior descending artery and left circumflex artery distribution(s). Moderate mitral annular calcification. Dual chamber pacemaker noted in SVC, RA and RV.   The study is normal. The study is low risk.   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     Past Surgical History:  Procedure Laterality Date   CHOLECYSTECTOMY     CHOLECYSTECTOMY, LAPAROSCOPIC     COLONOSCOPY     ESOPHAGOGASTRODUODENOSCOPY     lesion, vulva excision     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   Procedure: PACEMAKER IMPLANT;  Surgeon: Marinus Maw, MD;  Location: [facility] INVASIVE CV LAB;  Service: Cardiovascular;  Laterality: N/A;   TONSILLECTOMY      VIDEO BRONCHOSCOPY Bilateral 11/06/2017   Procedure: VIDEO BRONCHOSCOPY WITHOUT FLUORO;  Surgeon: Chilton Greathouse, MD;  Location: [facility] ENDOSCOPY;  Service: Cardiopulmonary;  Laterality: Bilateral;    MEDICATIONS:  acetaminophen (TYLENOL) 325 MG tablet   albuterol (VENTOLIN HFA) 108 (90 Base) MCG/ACT inhaler   apixaban (ELIQUIS) 5 MG TABS tablet   benzonatate (TESSALON) 200 MG capsule   calcium carbonate (OS-CAL - DOSED IN MG OF ELEMENTAL CALCIUM) 1250 (500 Ca) MG tablet   Cholecalciferol (VITAMIN D) 50 MCG (2000 UT) tablet   empagliflozin (JARDIANCE) 10 MG TABS tablet   fluticasone (FLONASE) 50 MCG/ACT nasal spray   folic acid (FOLVITE) 1 MG tablet  furosemide (LASIX) 20 MG tablet   gabapentin (NEURONTIN) 300 MG capsule   metoprolol succinate (TOPROL XL) 25 MG 24 hr tablet   Misc Natural Products (ADVANCED JOINT RELIEF) CAPS   Multiple Vitamin (MULTIVITAMIN WITH MINERALS) TABS tablet   predniSONE (DELTASONE) 20 MG tablet   RESTASIS 0.05 % ophthalmic emulsion   triamcinolone cream (KENALOG) 0.1 %   umeclidinium-vilanterol (ANORO ELLIPTA) 62.5-25 MCG/ACT AEPB   vitamin C (ASCORBIC ACID) 500 MG tablet   vitamin E 180 MG (400 UNITS) capsule   zolpidem (AMBIEN) 5 MG tablet   No current facility-administered medications for this encounter.    Shonna Chock, PA-C Surgical Short Stay/Anesthesiology [facility] Phone [phone] [facility] Phone [phone] 09/11/2022 11:29 AM

## 2022-09-12 ENCOUNTER — Telehealth: Payer: Self-pay | Admitting: Pulmonary Disease

## 2022-09-12 DIAGNOSIS — Z79899 Other long term (current) drug therapy: Secondary | ICD-10-CM | POA: Diagnosis not present

## 2022-09-12 DIAGNOSIS — M4802 Spinal stenosis, cervical region: Secondary | ICD-10-CM | POA: Diagnosis not present

## 2022-09-12 DIAGNOSIS — M329 Systemic lupus erythematosus, unspecified: Secondary | ICD-10-CM | POA: Diagnosis not present

## 2022-09-12 DIAGNOSIS — K760 Fatty (change of) liver, not elsewhere classified: Secondary | ICD-10-CM | POA: Diagnosis not present

## 2022-09-12 DIAGNOSIS — R748 Abnormal levels of other serum enzymes: Secondary | ICD-10-CM | POA: Diagnosis not present

## 2022-09-12 DIAGNOSIS — Z6824 Body mass index (BMI) 24.0-24.9, adult: Secondary | ICD-10-CM | POA: Diagnosis not present

## 2022-09-12 DIAGNOSIS — M48061 Spinal stenosis, lumbar region without neurogenic claudication: Secondary | ICD-10-CM | POA: Diagnosis not present

## 2022-09-12 LAB — LAB REPORT - SCANNED: Creatinine, POC: 36 mg/dL

## 2022-09-12 NOTE — Telephone Encounter (Signed)
Pulmonary medicine does not provide preoperative clearance rather a preoperative risk stratification.  Based on the ARISCAT model, patient is low at 1.6% risk of postoperative pulmonary complication assuming duration of surgery is 3 hours or less.  If duration of surgery is greater than 3 hours, patient is intermediate or 13.3% risk of postoperative pulmonary complication.  There are no modifiable risk factors identifiable prior to surgery.

## 2022-09-12 NOTE — Anesthesia Preprocedure Evaluation (Addendum)
Anesthesia Evaluation  Patient identified by MRN, date of birth, ID band Patient awake    Reviewed: Allergy & Precautions, NPO status , Patient's Chart, lab work & pertinent test results  Airway Mallampati: II  TM Distance: >3 FB Neck ROM: Limited    Dental no notable dental hx.    Pulmonary COPD, former smoker   Pulmonary exam normal        Cardiovascular hypertension, Pt. on medications and Pt. on home beta blockers +CHF  + pacemaker  Rhythm:Regular Rate:Normal     Neuro/Psych negative neurological ROS  negative psych ROS   GI/Hepatic negative GI ROS, Neg liver ROS,,,  Endo/Other  negative endocrine ROS    Renal/GU negative Renal ROS  negative genitourinary   Musculoskeletal  (+) Arthritis , Osteoarthritis,    Abdominal Normal abdominal exam  (+)   Peds  Hematology Lab Results      Component                Value               Date                      WBC                      5.9                 09/10/2022                HGB                      13.9                09/10/2022                HCT                      42.5                09/10/2022                MCV                      91.2                09/10/2022                PLT                      186                 09/10/2022             Lab Results      Component                Value               Date                      NA                       134 (L)             09/10/2022                K  3.7                 09/10/2022                CO2                      23                  09/10/2022                GLUCOSE                  92                  09/10/2022                BUN                      23                  09/10/2022                CREATININE               1.29 (H)            09/10/2022                CALCIUM                  9.3                 09/10/2022                EGFR                     46                   04/17/2022                GFRNONAA                 44 (L)              09/10/2022              Anesthesia Other Findings   Reproductive/Obstetrics                             Anesthesia Physical Anesthesia Plan  ASA: 3  Anesthesia Plan: General   Post-op Pain Management: Tylenol PO (pre-op)* and Celebrex PO (pre-op)*   Induction: Intravenous  PONV Risk Score and Plan: 3 and Ondansetron, Dexamethasone and Treatment may vary due to age or medical condition  Airway Management Planned: Mask and Oral ETT  Additional Equipment: None  Intra-op Plan:   Post-operative Plan: Extubation in OR  Informed Consent: I have reviewed the patients History and Physical, chart, labs and discussed the procedure including the risks, benefits and alternatives for the proposed anesthesia with the patient or authorized representative who has indicated his/her understanding and acceptance.     Dental advisory given  Plan Discussed with: CRNA  Anesthesia Plan Comments: (PAT note written by Shonna Chock, PA-C.  )       Anesthesia Quick Evaluation

## 2022-09-12 NOTE — Telephone Encounter (Signed)
Fax received from Dr. Donalee Citrin with [facility] to perform a C4-5, C5-6 Anterior Cervical Fusion on patient.  Patient needs surgery clearance. Surgery is 09/18/22. Patient was seen on 08/19/22. Office protocol is a risk assessment can be sent to surgeon if patient has been seen in 60 days or less.   Sending to Dr. Judeth Horn for risk assessment or recommendations if patient needs to be seen in office prior to surgical procedure.

## 2022-09-12 NOTE — Telephone Encounter (Signed)
OV notes and clearance form have been faxed back to [facility]. Nothing further needed at this time.  

## 2022-09-13 DIAGNOSIS — M542 Cervicalgia: Secondary | ICD-10-CM | POA: Diagnosis not present

## 2022-09-13 DIAGNOSIS — R29818 Other symptoms and signs involving the nervous system: Secondary | ICD-10-CM | POA: Diagnosis not present

## 2022-09-13 DIAGNOSIS — R260 Ataxic gait: Secondary | ICD-10-CM | POA: Diagnosis not present

## 2022-09-17 DIAGNOSIS — R29818 Other symptoms and signs involving the nervous system: Secondary | ICD-10-CM | POA: Diagnosis not present

## 2022-09-17 DIAGNOSIS — R260 Ataxic gait: Secondary | ICD-10-CM | POA: Diagnosis not present

## 2022-09-17 DIAGNOSIS — M542 Cervicalgia: Secondary | ICD-10-CM | POA: Diagnosis not present

## 2022-09-18 ENCOUNTER — Ambulatory Visit (HOSPITAL_COMMUNITY): Payer: PPO | Admitting: Vascular Surgery

## 2022-09-18 ENCOUNTER — Other Ambulatory Visit: Payer: Self-pay

## 2022-09-18 ENCOUNTER — Ambulatory Visit (HOSPITAL_COMMUNITY): Payer: PPO

## 2022-09-18 ENCOUNTER — Encounter (HOSPITAL_COMMUNITY)
Admission: RE | Disposition: A | Discharge status: Home / Self Care | Payer: Self-pay | Source: Home / Self Care | Attending: Neurosurgery

## 2022-09-18 ENCOUNTER — Encounter (HOSPITAL_COMMUNITY): Payer: Self-pay | Admitting: Neurosurgery

## 2022-09-18 ENCOUNTER — Ambulatory Visit (HOSPITAL_COMMUNITY)
Admission: RE | Admit: 2022-09-18 | Discharge: 2022-09-19 | Disposition: A | Discharge status: Home / Self Care | Payer: PPO | Attending: Neurosurgery | Admitting: Neurosurgery

## 2022-09-18 ENCOUNTER — Ambulatory Visit (HOSPITAL_BASED_OUTPATIENT_CLINIC_OR_DEPARTMENT_OTHER): Payer: PPO

## 2022-09-18 DIAGNOSIS — Z950 Presence of cardiac pacemaker: Secondary | ICD-10-CM | POA: Diagnosis not present

## 2022-09-18 DIAGNOSIS — M47812 Spondylosis without myelopathy or radiculopathy, cervical region: Secondary | ICD-10-CM | POA: Diagnosis present

## 2022-09-18 DIAGNOSIS — M4722 Other spondylosis with radiculopathy, cervical region: Secondary | ICD-10-CM | POA: Insufficient documentation

## 2022-09-18 DIAGNOSIS — M2578 Osteophyte, vertebrae: Secondary | ICD-10-CM | POA: Insufficient documentation

## 2022-09-18 DIAGNOSIS — I509 Heart failure, unspecified: Secondary | ICD-10-CM | POA: Insufficient documentation

## 2022-09-18 DIAGNOSIS — I110 Hypertensive heart disease with heart failure: Secondary | ICD-10-CM

## 2022-09-18 DIAGNOSIS — J449 Chronic obstructive pulmonary disease, unspecified: Secondary | ICD-10-CM | POA: Insufficient documentation

## 2022-09-18 DIAGNOSIS — M542 Cervicalgia: Secondary | ICD-10-CM | POA: Diagnosis not present

## 2022-09-18 DIAGNOSIS — Z87891 Personal history of nicotine dependence: Secondary | ICD-10-CM | POA: Insufficient documentation

## 2022-09-18 DIAGNOSIS — Z981 Arthrodesis status: Secondary | ICD-10-CM | POA: Diagnosis not present

## 2022-09-18 HISTORY — PX: ANTERIOR CERVICAL DECOMP/DISCECTOMY FUSION: SHX1161

## 2022-09-18 SURGERY — ANTERIOR CERVICAL DECOMPRESSION/DISCECTOMY FUSION 2 LEVELS
Anesthesia: General

## 2022-09-18 MED ORDER — MENTHOL 3 MG MT LOZG: 1.0000 | LOZENGE | OROMUCOSAL | Status: DC | PRN

## 2022-09-18 MED ORDER — LACTATED RINGERS IV SOLN: INTRAVENOUS | Status: DC

## 2022-09-18 MED ORDER — THROMBIN 5000 UNITS EX SOLR
CUTANEOUS | Status: AC
  Filled 2022-09-18: qty 10000

## 2022-09-18 MED ORDER — LACTATED RINGERS IV SOLN: INTRAVENOUS | Status: DC | PRN

## 2022-09-18 MED ORDER — ROCURONIUM BROMIDE 10 MG/ML (PF) SYRINGE
PREFILLED_SYRINGE | INTRAVENOUS | Status: DC | PRN
  Administered 2022-09-18: 60 mg via INTRAVENOUS

## 2022-09-18 MED ORDER — EMPAGLIFLOZIN 10 MG PO TABS: 10.0000 mg | ORAL_TABLET | Freq: Every day | ORAL | Status: DC

## 2022-09-18 MED ORDER — FENTANYL CITRATE (PF) 250 MCG/5ML IJ SOLN
INTRAMUSCULAR | Status: AC
  Filled 2022-09-18: qty 5

## 2022-09-18 MED ORDER — GABAPENTIN 300 MG PO CAPS
900.0000 mg | ORAL_CAPSULE | Freq: Every day | ORAL | Status: DC
  Administered 2022-09-18: 900 mg via ORAL
  Filled 2022-09-18: qty 3

## 2022-09-18 MED ORDER — CEFAZOLIN SODIUM-DEXTROSE 2-4 GM/100ML-% IV SOLN
INTRAVENOUS | Status: AC
  Filled 2022-09-18: qty 100

## 2022-09-18 MED ORDER — THROMBIN 5000 UNITS EX SOLR: OROMUCOSAL | Status: DC | PRN

## 2022-09-18 MED ORDER — LIDOCAINE 2% (20 MG/ML) 5 ML SYRINGE
INTRAMUSCULAR | Status: DC | PRN
  Administered 2022-09-18: 60 mg via INTRAVENOUS

## 2022-09-18 MED ORDER — ONDANSETRON HCL 4 MG PO TABS: 4.0000 mg | ORAL_TABLET | Freq: Four times a day (QID) | ORAL | Status: DC | PRN

## 2022-09-18 MED ORDER — METOPROLOL SUCCINATE ER 25 MG PO TB24
ORAL_TABLET | ORAL | Status: AC
  Filled 2022-09-18: qty 1

## 2022-09-18 MED ORDER — PROPOFOL 500 MG/50ML IV EMUL
INTRAVENOUS | Status: DC | PRN
  Administered 2022-09-18: 25 ug/kg/min via INTRAVENOUS

## 2022-09-18 MED ORDER — ONDANSETRON HCL 4 MG/2ML IJ SOLN
INTRAMUSCULAR | Status: DC | PRN
  Administered 2022-09-18 (×2): 4 mg via INTRAVENOUS

## 2022-09-18 MED ORDER — CELECOXIB 200 MG PO CAPS: 200.0000 mg | ORAL_CAPSULE | Freq: Once | ORAL | Status: AC

## 2022-09-18 MED ORDER — FENTANYL CITRATE (PF) 250 MCG/5ML IJ SOLN
INTRAMUSCULAR | Status: DC | PRN
  Administered 2022-09-18: 50 ug via INTRAVENOUS
  Administered 2022-09-18: 100 ug via INTRAVENOUS

## 2022-09-18 MED ORDER — ZOLPIDEM TARTRATE 5 MG PO TABS
5.0000 mg | ORAL_TABLET | Freq: Every evening | ORAL | Status: DC | PRN
  Administered 2022-09-19: 5 mg via ORAL
  Filled 2022-09-18: qty 1

## 2022-09-18 MED ORDER — TRIAMCINOLONE ACETONIDE 0.1 % EX CREA: 1.0000 | TOPICAL_CREAM | Freq: Every day | CUTANEOUS | Status: DC | PRN

## 2022-09-18 MED ORDER — ACETAMINOPHEN 325 MG PO TABS: 650.0000 mg | ORAL_TABLET | Freq: Four times a day (QID) | ORAL | Status: DC | PRN

## 2022-09-18 MED ORDER — SUGAMMADEX SODIUM 200 MG/2ML IV SOLN
INTRAVENOUS | Status: DC | PRN
  Administered 2022-09-18: 75 mg via INTRAVENOUS
  Administered 2022-09-18: 50 mg via INTRAVENOUS

## 2022-09-18 MED ORDER — KETAMINE HCL 50 MG/5ML IJ SOSY
PREFILLED_SYRINGE | INTRAMUSCULAR | Status: AC
  Filled 2022-09-18: qty 5

## 2022-09-18 MED ORDER — ADVANCED JOINT RELIEF PO CAPS: 1.0000 | ORAL_CAPSULE | Freq: Every day | ORAL | Status: DC

## 2022-09-18 MED ORDER — ORAL CARE MOUTH RINSE: 15.0000 mL | Freq: Once | OROMUCOSAL | Status: AC

## 2022-09-18 MED ORDER — FLUTICASONE PROPIONATE 50 MCG/ACT NA SUSP: 2.0000 | NASAL | Status: DC | PRN

## 2022-09-18 MED ORDER — CALCIUM CARBONATE 1250 (500 CA) MG PO TABS
1.0000 | ORAL_TABLET | Freq: Every day | ORAL | Status: DC
  Administered 2022-09-18: 1250 mg via ORAL
  Filled 2022-09-18: qty 1

## 2022-09-18 MED ORDER — ACETAMINOPHEN 500 MG PO TABS
ORAL_TABLET | ORAL | Status: AC
  Administered 2022-09-18: 1000 mg via ORAL
  Filled 2022-09-18: qty 2

## 2022-09-18 MED ORDER — UMECLIDINIUM-VILANTEROL 62.5-25 MCG/ACT IN AEPB
1.0000 | INHALATION_SPRAY | Freq: Every day | RESPIRATORY_TRACT | Status: DC
  Administered 2022-09-19: 1 via RESPIRATORY_TRACT
  Filled 2022-09-18: qty 14

## 2022-09-18 MED ORDER — ACETAMINOPHEN 325 MG PO TABS: 650.0000 mg | ORAL_TABLET | ORAL | Status: DC | PRN

## 2022-09-18 MED ORDER — HYDROMORPHONE HCL 1 MG/ML IJ SOLN: 0.5000 mg | INTRAMUSCULAR | Status: DC | PRN

## 2022-09-18 MED ORDER — CHLORHEXIDINE GLUCONATE 0.12 % MT SOLN: 15.0000 mL | Freq: Once | OROMUCOSAL | Status: AC

## 2022-09-18 MED ORDER — CELECOXIB 200 MG PO CAPS
ORAL_CAPSULE | ORAL | Status: AC
  Administered 2022-09-18: 200 mg via ORAL
  Filled 2022-09-18: qty 1

## 2022-09-18 MED ORDER — PANTOPRAZOLE SODIUM 40 MG PO TBEC
40.0000 mg | DELAYED_RELEASE_TABLET | Freq: Every day | ORAL | Status: DC
  Administered 2022-09-18: 40 mg via ORAL
  Filled 2022-09-18: qty 1

## 2022-09-18 MED ORDER — CHLORHEXIDINE GLUCONATE CLOTH 2 % EX PADS: 6.0000 | MEDICATED_PAD | Freq: Once | CUTANEOUS | Status: DC

## 2022-09-18 MED ORDER — METOPROLOL SUCCINATE ER 25 MG PO TB24
25.0000 mg | ORAL_TABLET | Freq: Once | ORAL | Status: AC
  Administered 2022-09-18: 25 mg via ORAL

## 2022-09-18 MED ORDER — VITAMIN D 25 MCG (1000 UNIT) PO TABS
4000.0000 [IU] | ORAL_TABLET | Freq: Every evening | ORAL | Status: DC
  Administered 2022-09-18: 4000 [IU] via ORAL
  Filled 2022-09-18: qty 4

## 2022-09-18 MED ORDER — FENTANYL CITRATE (PF) 100 MCG/2ML IJ SOLN
INTRAMUSCULAR | Status: AC
  Filled 2022-09-18: qty 2

## 2022-09-18 MED ORDER — VITAMIN E 45 MG (100 UNIT) PO CAPS
400.0000 [IU] | ORAL_CAPSULE | Freq: Every day | ORAL | Status: DC
  Administered 2022-09-18: 400 [IU] via ORAL
  Filled 2022-09-18: qty 4

## 2022-09-18 MED ORDER — PANTOPRAZOLE SODIUM 40 MG IV SOLR: 40.0000 mg | Freq: Every day | INTRAVENOUS | Status: DC

## 2022-09-18 MED ORDER — FOLIC ACID 1 MG PO TABS
1.0000 mg | ORAL_TABLET | Freq: Every evening | ORAL | Status: DC
  Administered 2022-09-18: 1 mg via ORAL
  Filled 2022-09-18: qty 1

## 2022-09-18 MED ORDER — METOPROLOL SUCCINATE ER 25 MG PO TB24
25.0000 mg | ORAL_TABLET | Freq: Every day | ORAL | Status: DC
  Filled 2022-09-18: qty 1

## 2022-09-18 MED ORDER — SODIUM CHLORIDE 0.9 % IV SOLN: 250.0000 mL | INTRAVENOUS | Status: DC

## 2022-09-18 MED ORDER — CYCLOBENZAPRINE HCL 10 MG PO TABS
10.0000 mg | ORAL_TABLET | Freq: Three times a day (TID) | ORAL | Status: DC | PRN
  Administered 2022-09-18: 10 mg via ORAL
  Filled 2022-09-18: qty 1

## 2022-09-18 MED ORDER — ALUM & MAG HYDROXIDE-SIMETH 200-200-20 MG/5ML PO SUSP: 30.0000 mL | Freq: Four times a day (QID) | ORAL | Status: DC | PRN

## 2022-09-18 MED ORDER — PHENYLEPHRINE HCL-NACL 20-0.9 MG/250ML-% IV SOLN
INTRAVENOUS | Status: DC | PRN
  Administered 2022-09-18: 25 ug/min via INTRAVENOUS

## 2022-09-18 MED ORDER — PHENOL 1.4 % MT LIQD: 1.0000 | OROMUCOSAL | Status: DC | PRN

## 2022-09-18 MED ORDER — PROPOFOL 10 MG/ML IV BOLUS
INTRAVENOUS | Status: AC
  Filled 2022-09-18: qty 20

## 2022-09-18 MED ORDER — CYCLOSPORINE 0.05 % OP EMUL
1.0000 [drp] | Freq: Two times a day (BID) | OPHTHALMIC | Status: DC
  Filled 2022-09-18 (×3): qty 30

## 2022-09-18 MED ORDER — ALBUTEROL SULFATE (2.5 MG/3ML) 0.083% IN NEBU: 2.5000 mg | INHALATION_SOLUTION | Freq: Four times a day (QID) | RESPIRATORY_TRACT | Status: DC | PRN

## 2022-09-18 MED ORDER — FUROSEMIDE 20 MG PO TABS
20.0000 mg | ORAL_TABLET | Freq: Every day | ORAL | Status: DC
  Administered 2022-09-18: 20 mg via ORAL
  Filled 2022-09-18: qty 1

## 2022-09-18 MED ORDER — CHLORHEXIDINE GLUCONATE 0.12 % MT SOLN
OROMUCOSAL | Status: AC
  Administered 2022-09-18: 15 mL via OROMUCOSAL
  Filled 2022-09-18: qty 15

## 2022-09-18 MED ORDER — ONDANSETRON HCL 4 MG/2ML IJ SOLN: 4.0000 mg | Freq: Four times a day (QID) | INTRAMUSCULAR | Status: DC | PRN

## 2022-09-18 MED ORDER — MIDAZOLAM HCL 5 MG/5ML IJ SOLN
INTRAMUSCULAR | Status: DC | PRN
  Administered 2022-09-18: 1 mg via INTRAVENOUS

## 2022-09-18 MED ORDER — ACETAMINOPHEN 650 MG RE SUPP: 650.0000 mg | RECTAL | Status: DC | PRN

## 2022-09-18 MED ORDER — SODIUM CHLORIDE 0.9% FLUSH: 3.0000 mL | Freq: Two times a day (BID) | INTRAVENOUS | Status: DC

## 2022-09-18 MED ORDER — THROMBIN 5000 UNITS EX SOLR
CUTANEOUS | Status: DC | PRN
  Administered 2022-09-18: 5000 [IU] via TOPICAL

## 2022-09-18 MED ORDER — BENZONATATE 100 MG PO CAPS: 200.0000 mg | ORAL_CAPSULE | Freq: Four times a day (QID) | ORAL | Status: DC | PRN

## 2022-09-18 MED ORDER — ADULT MULTIVITAMIN W/MINERALS CH
1.0000 | ORAL_TABLET | Freq: Every day | ORAL | Status: DC
  Administered 2022-09-18: 1 via ORAL
  Filled 2022-09-18: qty 1

## 2022-09-18 MED ORDER — 0.9 % SODIUM CHLORIDE (POUR BTL) OPTIME
TOPICAL | Status: DC | PRN
  Administered 2022-09-18: 1000 mL

## 2022-09-18 MED ORDER — SODIUM CHLORIDE 0.9% FLUSH: 3.0000 mL | INTRAVENOUS | Status: DC | PRN

## 2022-09-18 MED ORDER — CEFAZOLIN SODIUM-DEXTROSE 2-4 GM/100ML-% IV SOLN
2.0000 g | INTRAVENOUS | Status: AC
  Administered 2022-09-18: 2 g via INTRAVENOUS

## 2022-09-18 MED ORDER — CEFAZOLIN SODIUM-DEXTROSE 2-4 GM/100ML-% IV SOLN
2.0000 g | Freq: Three times a day (TID) | INTRAVENOUS | Status: AC
  Administered 2022-09-18 – 2022-09-19 (×2): 2 g via INTRAVENOUS
  Filled 2022-09-18 (×2): qty 100

## 2022-09-18 MED ORDER — DEXAMETHASONE SODIUM PHOSPHATE 10 MG/ML IJ SOLN
INTRAMUSCULAR | Status: DC | PRN
  Administered 2022-09-18: 8 mg via INTRAVENOUS

## 2022-09-18 MED ORDER — PROPOFOL 10 MG/ML IV BOLUS
INTRAVENOUS | Status: DC | PRN
  Administered 2022-09-18: 100 mg via INTRAVENOUS

## 2022-09-18 MED ORDER — VITAMIN C 500 MG PO TABS
500.0000 mg | ORAL_TABLET | Freq: Every day | ORAL | Status: DC
  Administered 2022-09-18: 500 mg via ORAL
  Filled 2022-09-18: qty 1

## 2022-09-18 MED ORDER — ACETAMINOPHEN 500 MG PO TABS: 1000.0000 mg | ORAL_TABLET | Freq: Once | ORAL | Status: AC

## 2022-09-18 MED ORDER — FENTANYL CITRATE (PF) 100 MCG/2ML IJ SOLN
25.0000 ug | INTRAMUSCULAR | Status: DC | PRN
  Administered 2022-09-18 (×3): 25 ug via INTRAVENOUS

## 2022-09-18 MED ORDER — KETAMINE HCL 10 MG/ML IJ SOLN
INTRAMUSCULAR | Status: DC | PRN
  Administered 2022-09-18 (×2): 10 mg via INTRAVENOUS
  Administered 2022-09-18: 5 mg via INTRAVENOUS

## 2022-09-18 MED ORDER — HYDROCODONE-ACETAMINOPHEN 5-325 MG PO TABS
2.0000 | ORAL_TABLET | ORAL | Status: DC | PRN
  Administered 2022-09-18 – 2022-09-19 (×2): 2 via ORAL
  Filled 2022-09-18 (×2): qty 2

## 2022-09-18 MED ORDER — MIDAZOLAM HCL 2 MG/2ML IJ SOLN
INTRAMUSCULAR | Status: AC
  Filled 2022-09-18: qty 2

## 2022-09-18 SURGICAL SUPPLY — 60 items
ADH SKN CLS APL DERMABOND .7 (GAUZE/BANDAGES/DRESSINGS) ×1
APL SKNCLS STERI-STRIP NONHPOA (GAUZE/BANDAGES/DRESSINGS) ×1
BAG COUNTER SPONGE SURGICOUNT (BAG) ×1 IMPLANT
BAG SPNG CNTER NS LX DISP (BAG) ×2
BASKET BONE COLLECTION (BASKET) ×1 IMPLANT
BENZOIN TINCTURE PRP APPL 2/3 (GAUZE/BANDAGES/DRESSINGS) ×1 IMPLANT
BIT DRILL NEURO 2X3.1 SFT TUCH (MISCELLANEOUS) ×1 IMPLANT
BONE VIVIGEN FORMABLE 1.3CC (Bone Implant) ×1 IMPLANT
BUR MATCHSTICK NEURO 3.0 LAGG (BURR) ×1 IMPLANT
CANISTER SUCT 3000ML PPV (MISCELLANEOUS) ×1 IMPLANT
DERMABOND ADVANCED .7 DNX12 (GAUZE/BANDAGES/DRESSINGS) IMPLANT
DRAPE C-ARM 42X72 X-RAY (DRAPES) ×2 IMPLANT
DRAPE LAPAROTOMY 100X72 PEDS (DRAPES) ×1 IMPLANT
DRAPE MICROSCOPE SLANT 54X150 (MISCELLANEOUS) ×1 IMPLANT
DRILL NEURO 2X3.1 SOFT TOUCH (MISCELLANEOUS) ×1
DRSG OPSITE POSTOP 4X6 (GAUZE/BANDAGES/DRESSINGS) IMPLANT
DURAPREP 6ML APPLICATOR 50/CS (WOUND CARE) ×1 IMPLANT
ELECT COATED BLADE 2.86 ST (ELECTRODE) ×1 IMPLANT
ELECT REM PT RETURN 9FT ADLT (ELECTROSURGICAL) ×1
ELECTRODE REM PT RTRN 9FT ADLT (ELECTROSURGICAL) ×1 IMPLANT
GAUZE 4X4 16PLY ~~LOC~~+RFID DBL (SPONGE) IMPLANT
GAUZE SPONGE 4X4 12PLY STRL (GAUZE/BANDAGES/DRESSINGS) ×1 IMPLANT
GLOVE BIO SURGEON STRL SZ7 (GLOVE) IMPLANT
GLOVE BIO SURGEON STRL SZ8 (GLOVE) ×1 IMPLANT
GLOVE BIOGEL PI IND STRL 7.0 (GLOVE) IMPLANT
GLOVE BIOGEL PI IND STRL 7.5 (GLOVE) IMPLANT
GLOVE ECLIPSE 7.0 STRL STRAW (GLOVE) IMPLANT
GLOVE EXAM NITRILE XL STR (GLOVE) IMPLANT
GLOVE INDICATOR 8.5 STRL (GLOVE) ×2 IMPLANT
GOWN STRL REUS W/ TWL LRG LVL3 (GOWN DISPOSABLE) IMPLANT
GOWN STRL REUS W/ TWL XL LVL3 (GOWN DISPOSABLE) ×1 IMPLANT
GOWN STRL REUS W/TWL 2XL LVL3 (GOWN DISPOSABLE) IMPLANT
GOWN STRL REUS W/TWL LRG LVL3 (GOWN DISPOSABLE) ×1
GOWN STRL REUS W/TWL XL LVL3 (GOWN DISPOSABLE) ×3
GRAFT BNE MATRIX VG FRMBL SM 1 (Bone Implant) IMPLANT
HALTER HD/CHIN CERV TRACTION D (MISCELLANEOUS) ×1 IMPLANT
HEMOSTAT POWDER KIT SURGIFOAM (HEMOSTASIS) ×1 IMPLANT
KIT BASIN OR (CUSTOM PROCEDURE TRAY) ×1 IMPLANT
KIT TURNOVER KIT B (KITS) ×1 IMPLANT
NDL SPNL 20GX3.5 QUINCKE YW (NEEDLE) ×1 IMPLANT
NEEDLE SPNL 20GX3.5 QUINCKE YW (NEEDLE) ×1 IMPLANT
NS IRRIG 1000ML POUR BTL (IV SOLUTION) ×1 IMPLANT
PACK LAMINECTOMY NEURO (CUSTOM PROCEDURE TRAY) ×1 IMPLANT
PAD ARMBOARD 7.5X6 YLW CONV (MISCELLANEOUS) ×3 IMPLANT
PIN DISTRACTION 14MM (PIN) IMPLANT
PLATE CERV RES 32 2LVL (Plate) IMPLANT
SCREW VA SD RESONATE 4.2X14 (Screw) IMPLANT
SOL ELECTROSURG ANTI STICK (MISCELLANEOUS) ×1
SOLUTION ELECTROSURG ANTI STCK (MISCELLANEOUS) ×1 IMPLANT
SPACER HEDRON 14X16X6 0D (Spacer) IMPLANT
SPACER HEDRON 14X16X8 0D (Spacer) IMPLANT
SPONGE INTESTINAL PEANUT (DISPOSABLE) ×1 IMPLANT
SPONGE SURGIFOAM ABS GEL SZ50 (HEMOSTASIS) ×1 IMPLANT
STRIP CLOSURE SKIN 1/2X4 (GAUZE/BANDAGES/DRESSINGS) ×1 IMPLANT
SUT VIC AB 3-0 SH 8-18 (SUTURE) ×1 IMPLANT
SUT VICRYL 4-0 PS2 18IN ABS (SUTURE) ×1 IMPLANT
TAPE CLOTH 4X10 WHT NS (GAUZE/BANDAGES/DRESSINGS) ×1 IMPLANT
TOWEL GREEN STERILE (TOWEL DISPOSABLE) ×1 IMPLANT
TOWEL GREEN STERILE FF (TOWEL DISPOSABLE) ×1 IMPLANT
WATER STERILE IRR 1000ML POUR (IV SOLUTION) ×1 IMPLANT

## 2022-09-18 NOTE — Transfer of Care (Signed)
Immediate Anesthesia Transfer of Care Note  Patient: Sheila Morales  Procedure(s) Performed: Anterior Cervical Discectomy Fusion Cervical Four-Cervical Five, Cervical Five-Cervical Six  Patient Location: PACU  Anesthesia Type:General  Level of Consciousness: drowsy  Airway & Oxygen Therapy: Patient Spontanous Breathing and Patient connected to face mask oxygen  Post-op Assessment: Report given to RN and Post -op Vital signs reviewed and stable  Post vital signs: Reviewed and stable  Last Vitals:  Vitals Value Taken Time  BP 155/90 09/18/22 1005  Temp    Pulse 80 09/18/22 1008  Resp 13 09/18/22 1008  SpO2 95 % 09/18/22 1008  Vitals shown include unvalidated device data.  Last Pain:  Vitals:   09/18/22 0614  TempSrc: Oral  PainSc: 0-No pain         Complications: No notable events documented.

## 2022-09-18 NOTE — Progress Notes (Signed)
OT Cancellation Note  Patient Details Name: Sheila Morales MRN: [id number] DOB: November 27, 1950   Cancelled Treatment:    Reason Eval/Treat Not Completed: Patient's level of consciousness (Attempted to see pt this afternoon for OT evaluation. Pt sound asleep and unable to participate at this time. Will follow up with pt tomorrow and complete.)  Limmie Patricia, OTR/L,CBIS  Supplemental OT - [facility] Secure Chat Preferred   09/18/2022, 2:56 PM

## 2022-09-18 NOTE — Anesthesia Procedure Notes (Signed)
Procedure Name: Intubation Date/Time: 09/18/2022 7:55 AM  Performed by: Marny Lowenstein, CRNAPre-anesthesia Checklist: Patient identified, Emergency Drugs available, Suction available and Patient being monitored Patient Re-evaluated:Patient Re-evaluated prior to induction Oxygen Delivery Method: Circle system utilized Preoxygenation: Pre-oxygenation with 100% oxygen Induction Type: IV induction Ventilation: Mask ventilation without difficulty Laryngoscope Size: Glidescope and 3 Grade View: Grade I Tube type: Oral Tube size: 7.0 mm Number of attempts: 1 Airway Equipment and Method: Rigid stylet and Video-laryngoscopy Placement Confirmation: ETT inserted through vocal cords under direct vision, positive ETCO2 and breath sounds checked- equal and bilateral Secured at: 22 cm Tube secured with: Tape Dental Injury: Teeth and Oropharynx as per pre-operative assessment

## 2022-09-18 NOTE — Anesthesia Postprocedure Evaluation (Signed)
Anesthesia Post Note  Patient: Sheila Morales  Procedure(s) Performed: Anterior Cervical Discectomy Fusion Cervical Four-Cervical Five, Cervical Five-Cervical Six     Patient location during evaluation: PACU Anesthesia Type: General Level of consciousness: awake and alert Pain management: pain level controlled Vital Signs Assessment: post-procedure vital signs reviewed and stable Respiratory status: spontaneous breathing, nonlabored ventilation, respiratory function stable and patient connected to nasal cannula oxygen Cardiovascular status: blood pressure returned to baseline and stable Postop Assessment: no apparent nausea or vomiting Anesthetic complications: no   No notable events documented.  Last Vitals:  Vitals:   09/18/22 1115 09/18/22 1145  BP: (!) 135/91 137/86  Pulse: 80 81  Resp: 19 16  Temp: 36.5 C   SpO2: 97% 96%    Last Pain:  Vitals:   09/18/22 1150  TempSrc:   PainSc: 0-No pain                 Earl Lites P Rodolphe Edmonston

## 2022-09-18 NOTE — Progress Notes (Signed)
Orthopedic Tech Progress Note Patient Details:  MARINDA JONCAS Jun 16, 1950 [id number]  RN called requesting a SOFT COLLAR for patient. Applied it with husband at bedside   Ortho Devices Type of Ortho Device: Soft collar Ortho Device/Splint Location: NECK Ortho Device/Splint Interventions: Ordered, Application   Post Interventions Patient Tolerated: Well Instructions Provided: Care of device  Donald Pore 09/18/2022, 12:17 PM

## 2022-09-18 NOTE — Op Note (Signed)
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  Postoperative diagnosis: Same.  Procedure: Anterior cervical discectomies and fusion at C4-5 and C5-6 utilizing the globus titanium cages packed with locally harvested autograft mixed with Vivigen and anterior cervical plating utilizing the globus resonate plating system.  Surgeon: Donalee Citrin.  Assistant: Julien Girt.  Anesthesia: General.  EBL: Minimal.  HPI: 72 year old female with longstanding neck and bilateral shoulder and arm pain workup revealed severe cervical spondylosis at C4-5 and C5-6 into the patient's progression of clinical syndrome imaging findings of a conservative treatment I recommended anterior cervical discectomies and fusion at those 2 levels.  I extensively reviewed the risks and benefits of the operation with her as well as perioperative course expectations of outcome and alternatives of surgery and she understood and agreed to proceed forward.  Operative procedure: Patient was brought into the OR was induced under general anesthesia positioned supine the neck in slight extension 5 pounds halter traction.  The right side of her neck was prepped and draped in routine sterile fashion.  Preoperative x-ray localized the appropriate level so a curvilinear incision was made just off midline to the intraportal of the sternocleidomastoid and the superficial lobe test was dissected and divided longitudinally.  The avascular plane between the sternomastoid and strap was was developed down to the prevertebral fascia and prevertebral fascia was dissected away with Kitners.  Interoperative x-ray confirmed application appropriate level annulotomy was made with a 15 blade scalpel to mark the disc base longus was reflected laterally and self-retaining retractors were placed.  Anterior osteophytes were bitten off with a 2 and 3 Miller Kerrison punch.  Both disc bases were drilled down To Bone Shavings and Mucus Trap.   Under Microscopic Lamination First Working at [employer] Was Further Drilled down the Posterior Annulus and Osteophytic Complex.  And under Microscopic Lamination under Biting of Both Endplates Allowed to the Cage and Posterior Longitudinal Ligament Which Was Removed in Piecemeal Fashion.  There Was Large Posterior Spurs Coming off the Endplates Especially off of C4 This Was All Aggressively under Bitton Marching Laterally Both C5 Pedicles Were Identified and Both C5 Nerve Roots Were Decompressed and Skeletonized Flush with the Pedicle.  At the End of Discectomy There Is No Further Stenosis Foraminally or Centrally This Was Packed with Gelfoam Tension Taken at C5-6.  In a Similar Fashion Disc This Space Was Drilled down Capturing the Bone Shavings and Mucus Trap and a Large Spur Coming off the C5 Vertebral Body All Removed Posteriorly Marching Laterally Both C6 Nerve Roots Were Identified and Both C6 Nerve Roots Were Decompressed and Skeletonized Flush with the Pedicle.  After Adequate Decompression Been Achieved Centrally and Foraminally I Sized up Both Implants I Selected a 6 Mm 0 Degree Cage for C4-5 and a 8 Mm 0 Degree for C5-6 at 32 Mm Globus Resonate Plate Cages Were Packed with Locally Harvested Autograft Mix and Some Additional Mix Was Packed Laterally to the Cages Then 30 Mm Plate Was Placed All Screws Had Excellent Purchase Locking Mechanisms Were Engaged.  The Wounds and Copiously Irrigated Meticulous Hemostasis Was Maintained the Platysma Was Reapproximated with Interrupted Vicryl Skin Was Closed Running 4 Subcuticular Dermabond Benzoin Steri-Strips and a Sterile Dressing Was Applied Patient Recovery in Stable Condition.  At the End the Case All Needle Count Sponge Counts Were Correct.

## 2022-09-18 NOTE — H&P (Signed)
Sheila Morales is an 72 y.o. female.   Chief Complaint: Neck and bilateral shoulder and arm pain HPI: 72 year old female progressive worsening neck bilateral shoulder and arm pain numbness tingling in her hands workup revealed severe cervical spondylosis C4-5 C5-6.  Due to patient's progression of clinical syndrome imaging findings of a conservative treatment I recommended anterior cervical discectomies and fusion at those 2 levels.  I extensively reviewed the risks and benefits of the operation with the patient as well as perioperative course expectations of outcome and alternatives of surgery and she understood and agreed to proceed forward.  Past Medical History:  Diagnosis Date   A-fib [facility])    Acute respiratory failure with hypoxia (HCC) 11/05/2017   Arthritis    Cervicalgia    CHF (congestive heart failure) (HCC)    Colitis, ischemic (HCC)    COPD (chronic obstructive pulmonary disease) (HCC)    External hemorrhoids    H/O: rheumatic fever    IBS (irritable bowel syndrome)    Pacemaker    Personal history colonic adenoma 03/25/2008   06/2007 right sided adenoma and 2 right hyperplastic polyps     Restless leg syndrome    questionable   Systemic lupus erythematosus (HCC)    Tubular adenoma of colon    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     Past Surgical History:  Procedure Laterality Date   CHOLECYSTECTOMY     CHOLECYSTECTOMY, LAPAROSCOPIC     COLONOSCOPY     ESOPHAGOGASTRODUODENOSCOPY     lesion, vulva excision     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   Procedure: PACEMAKER IMPLANT;  Surgeon: Marinus Maw, MD;  Location: [facility] INVASIVE CV LAB;  Service: Cardiovascular;  Laterality: N/A;   TONSILLECTOMY     VIDEO BRONCHOSCOPY Bilateral 11/06/2017   Procedure: VIDEO BRONCHOSCOPY WITHOUT FLUORO;  Surgeon: Chilton Greathouse, MD;  Location: [facility] ENDOSCOPY;  Service: Cardiopulmonary;  Laterality: Bilateral;    Family History  Problem Relation Age of Onset   Stroke Mother         Deceased, 46s   Atrial fibrillation Father    Colon cancer Father    Dementia Sister    Other Sister        Pick's disease, deceased   Healthy Daughter    Social History:  reports that she quit smoking about 2 years ago. Her smoking use included cigarettes. She has a 7.50 pack-year smoking history. She has never used smokeless tobacco. She reports that she does not currently use alcohol after a past usage of about 5.0 standard drinks of alcohol per week. She reports that she does not use drugs.  Allergies: No Known Allergies  Medications Prior to Admission  Medication Sig Dispense Refill   acetaminophen (TYLENOL) 325 MG tablet Take 650 mg by mouth every 6 (six) hours as needed for moderate pain or mild pain.     albuterol (VENTOLIN HFA) 108 (90 Base) MCG/ACT inhaler Inhale 1-2 puffs into the lungs every 6 (six) hours as needed for wheezing or shortness of breath. 8 g 5   apixaban (ELIQUIS) 5 MG TABS tablet Take 1 tablet (5 mg total) by mouth 2 (two) times daily. 180 tablet 1   benzonatate (TESSALON) 200 MG capsule TAKE 1 CAPSULE (200 MG TOTAL) BY MOUTH EVERY 6 (SIX) HOURS AS NEEDED FOR COUGH. 30 capsule 1   calcium carbonate (OS-CAL - DOSED IN MG OF ELEMENTAL CALCIUM) 1250 (500 Ca) MG tablet Take 1 tablet by mouth every evening.  Cholecalciferol (VITAMIN D) 50 MCG (2000 UT) tablet Take 4,000 Units by mouth every evening.     empagliflozin (JARDIANCE) 10 MG TABS tablet Take 1 tablet (10 mg total) by mouth daily before breakfast. 30 tablet 11   fluticasone (FLONASE) 50 MCG/ACT nasal spray Place 2 sprays into both nostrils as needed for allergies or rhinitis.     folic acid (FOLVITE) 1 MG tablet Take 1 mg by mouth every evening.     furosemide (LASIX) 20 MG tablet TAKE 1 TABLET (20 MG) BY MOUTH DAILY, TAKE 1 EXTRA TABLET BY MOUTH (20 MG) AS NEEDED FOR INCREASED SWELLING. 90 tablet 3   gabapentin (NEURONTIN) 300 MG capsule Take 900 mg by mouth at bedtime.     metoprolol succinate (TOPROL XL)  25 MG 24 hr tablet Take 1 tablet (25 mg total) by mouth daily. 90 tablet 3   Misc Natural Products (ADVANCED JOINT RELIEF) CAPS Take 1 capsule by mouth daily. Joint Advantage Gold 5x     Multiple Vitamin (MULTIVITAMIN WITH MINERALS) TABS tablet Take 1 tablet by mouth daily.     RESTASIS 0.05 % ophthalmic emulsion Place 1 drop into both eyes 2 (two) times daily.  6   umeclidinium-vilanterol (ANORO ELLIPTA) 62.5-25 MCG/ACT AEPB TAKE 1 PUFF BY MOUTH EVERY DAY 60 each 4   vitamin C (ASCORBIC ACID) 500 MG tablet Take 500 mg by mouth daily.     vitamin E 180 MG (400 UNITS) capsule Take 400 Units by mouth daily.     zolpidem (AMBIEN) 5 MG tablet TAKE 1 TABLET BY MOUTH AT BEDTIME AS NEEDED FOR SLEEP 30 tablet 5   predniSONE (DELTASONE) 20 MG tablet As needed if bronchitis recurs. Take 40 mg once daily for 5 days. (Patient not taking: Reported on 09/06/2022) 20 tablet 1   triamcinolone cream (KENALOG) 0.1 % Apply 1 Application topically daily as needed (irritation).      No results found for this or any previous visit (from the past 48 hour(s)). No results found.  Review of Systems  Musculoskeletal:  Positive for neck pain.  Neurological:  Positive for numbness.    Blood pressure (!) 147/84, pulse 65, temperature 98.1 F (36.7 C), temperature source Oral, resp. rate 18, height 5\' 2"  (1.575 m), weight 59.9 kg, SpO2 93 %. Physical Exam HENT:     Head: Normocephalic.     Right Ear: Tympanic membrane normal.     Nose: Nose normal.     Mouth/Throat:     Mouth: Mucous membranes are moist.  Eyes:     Pupils: Pupils are equal, round, and reactive to light.  Cardiovascular:     Rate and Rhythm: Normal rate.  Pulmonary:     Effort: Pulmonary effort is normal.  Abdominal:     General: Abdomen is flat.  Musculoskeletal:        General: Normal range of motion.     Cervical back: Normal range of motion.  Neurological:     Mental Status: She is alert.     Comments: Strength is 5 of 5 deltoid, bicep,  tricep, wrist flexion, wrist extension, hand intrinsics.      Assessment/Plan Patient presents for ACDF C4-5 C5-6  Sheila Dollar, MD 09/18/2022, 7:25 AM

## 2022-09-19 ENCOUNTER — Encounter (HOSPITAL_COMMUNITY): Payer: Self-pay | Admitting: Neurosurgery

## 2022-09-19 DIAGNOSIS — M4722 Other spondylosis with radiculopathy, cervical region: Secondary | ICD-10-CM | POA: Diagnosis not present

## 2022-09-19 MED ORDER — HYDROCODONE-ACETAMINOPHEN 5-325 MG PO TABS: 1.0000 | ORAL_TABLET | ORAL | 0 refills | Status: DC | PRN

## 2022-09-19 MED ORDER — CYCLOBENZAPRINE HCL 10 MG PO TABS: 10.0000 mg | ORAL_TABLET | Freq: Three times a day (TID) | ORAL | 0 refills | Status: DC | PRN

## 2022-09-19 NOTE — Evaluation (Signed)
Occupational Therapy Evaluation Patient Details Name: Sheila Morales MRN: [id number] DOB: 28-Aug-1950 Today's Date: 09/19/2022   History of Present Illness Sheila Morales is a 72 y/o female admitted 09/18/22 for planned ACDF of C4-5-6.  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   Clinical Impression   Pt is typically mod I for ADL and mobility. She is currently getting PT 2x/wk in the outpatient setting. Today she is overall min guard for mobility and UB/LB ADL. She does utilize a Paediatric nurse and husband assists with bathing back at baseline.  Today she demonstrated figure 4 method for LB dressing from seated position, educated in compensatory strategies to maintain cervical precautions (handout provided) and was able to perform a flight of stairs without LOB. At the end of the session, Pt and husband had no questions and education complete. OT to sign off at this time- Pt and husband report that they will continue PT once cleared by MD.      Recommendations for follow up therapy are one component of a multi-disciplinary discharge planning process, led by the attending physician.  Recommendations may be updated based on patient status, additional functional criteria and insurance authorization.   Assistance Recommended at Discharge Set up Supervision/Assistance  Patient can return home with the following A little help with bathing/dressing/bathroom;Assist for transportation;Help with stairs or ramp for entrance    Functional Status Assessment  Patient has had a recent decline in their functional status and demonstrates the ability to make significant improvements in function in a reasonable and predictable amount of time.  Equipment Recommendations  None recommended by OT (Pt  has appropriate DME)    Recommendations for Other Services       Precautions / Restrictions Precautions Precautions: Cervical Precaution Booklet Issued: Yes (comment) Required Braces or Orthoses: Cervical Brace Cervical Brace: Soft collar;For comfort Restrictions Weight Bearing Restrictions: No      Mobility Bed Mobility               General bed mobility comments: EOB at beginning and end of session, reviewed log roll technique    Transfers Overall transfer level: Needs assistance Equipment used: None Transfers: Sit to/from Stand Sit to Stand: Supervision                  Balance Overall balance assessment: Mild deficits observed, not formally tested (currently working with PT 2x/wk)                                         ADL either performed or assessed with clinical judgement   ADL Overall ADL's : Needs assistance/impaired Eating/Feeding: Independent   Grooming: Min guard;Standing   Upper Body Bathing: Moderate assistance;With caregiver independent assisting Upper Body Bathing Details (indicate cue type and reason): baseline Lower Body Bathing: Supervison/ safety;Sitting/lateral leans Lower Body Bathing Details (indicate cue type and reason): shower chair Upper Body Dressing : Set up;Sitting   Lower Body Dressing: Set up;Sitting/lateral leans   Toilet Transfer: Min guard;Ambulation Toilet Transfer Details (indicate cue type and reason): reaches out for environmental support Toileting- Architect and Hygiene: Min guard;Sit to/from stand   Tub/ Shower Transfer: Walk-in shower;Min guard;Ambulation;Shower Field seismologist Details (indicate cue type and reason): husband always helps her  for safety Functional mobility during ADLs: Min guard (reaches out for environmental support) General ADL Comments: reviewed cup method for oral care, figure 4 for LB and other compensatory strategies for ADL - also provided cervical  handout     Vision Ability to See in Adequate Light: 0 Adequate Patient Visual Report: No change from baseline Vision Assessment?: No apparent visual deficits     Perception     Praxis      Pertinent Vitals/Pain Pain Assessment Pain Assessment: Faces Faces Pain Scale: Hurts a little bit Pain Location: neck/incision Pain Descriptors / Indicators: Discomfort, Sore Pain Intervention(s): Limited activity within patient's tolerance, Monitored during session, Repositioned     Hand Dominance Right   Extremity/Trunk Assessment Upper Extremity Assessment Upper Extremity Assessment: Generalized weakness;Overall [facility] for tasks assessed   Lower Extremity Assessment Lower Extremity Assessment: Generalized weakness   Cervical / Trunk Assessment Cervical / Trunk Assessment: Neck Surgery   Communication Communication Communication: HOH   Cognition Arousal/Alertness: Awake/alert Behavior During Therapy: WFL for tasks assessed/performed Overall Cognitive Status: Within Functional Limits for tasks assessed                                       General Comments  husband present and recieved all information    Exercises     Shoulder Instructions      Home Living Family/patient expects to be discharged to:: Private residence Living Arrangements: Spouse/significant other Available Help at Discharge: Family;Available 24 hours/day Type of Home: House Home Access: Stairs to enter [employer] of Steps: 2 Entrance Stairs-Rails: None Home Layout: One level     Bathroom Shower/Tub: Producer, television/film/video: Handicapped height Bathroom Accessibility: Yes How Accessible: Accessible via walker Home Equipment: Control and instrumentation engineer (2 wheels);Cane - single point   Additional Comments: would like to be able to get in and out of garden tub for bath, loves to garden      Prior Functioning/Environment Prior Level of Function :  Independent/Modified Independent             Mobility Comments: going to PT 2x/wk- no DME for past 4 months ADLs Comments: independent, sometimes uses shower seat, husband does assist with back does not like long handle sponge        OT Problem List: Decreased activity tolerance;Impaired balance (sitting and/or standing);Decreased knowledge of use of DME or AE;Pain      OT Treatment/Interventions:      OT Goals(Current goals can be found in the care plan section) Acute Rehab OT Goals Patient Stated Goal: be able to walk down the pier to go fishing with her brother OT Goal Formulation: With patient/family Time For Goal Achievement: 10/03/22 Potential to Achieve Goals: Good  OT Frequency:      Co-evaluation              AM-PAC OT "6 Clicks" Daily Activity     Outcome Measure Help from another person eating meals?: None Help from another person taking care of personal grooming?: A Little Help from another person toileting, which includes using toliet, bedpan, or urinal?: A Little Help from another person bathing (including washing, rinsing, drying)?: A Little Help from another person to put on and taking off regular upper body clothing?: None Help from another person to put on and taking off regular lower body clothing?: None 6 Click Score: 21   End of Session Equipment Utilized  During Treatment: Gait belt;Cervical collar Nurse Communication: Mobility status;Precautions  Activity Tolerance: Patient tolerated treatment well Patient left: in bed;with family/visitor present  OT Visit Diagnosis: Other abnormalities of gait and mobility (R26.89)                Time: 1610-9604 OT Time Calculation (min): 29 min Charges:  OT General Charges $OT Visit: 1 Visit OT Evaluation $OT Eval Low Complexity: 1 Low  Nyoka Cowden OTR/L Acute Rehabilitation Services Office: [phone]  Emelda Fear 09/19/2022, 8:53 AM

## 2022-09-19 NOTE — Discharge Summary (Signed)
Physician Discharge Summary  Patient ID: Sheila Morales MRN: [id number] DOB/AGE: [birth year] 72 y.o.  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     Discharge Diagnoses: same   Discharged Condition: good  Hospital Course: The patient was admitted on 09/18/2022 and taken to the operating room where the patient underwent acdf C4-5, C5-6. The patient tolerated the procedure well and was taken to the recovery room and then to the floor in stable condition. The hospital course was routine. There were no complications. The wound remained clean dry and intact. Pt had appropriate neck soreness. No complaints of arm pain or new N/T/W. The patient remained afebrile with stable vital signs, and tolerated a regular diet. The patient continued to increase activities, and pain was well controlled with oral pain medications.   Consults: None  Significant Diagnostic Studies:  Results for orders placed or performed in visit on 09/18/22  Lab report - scanned  Result Value Ref Range   Creatinine, POC 36 mg/dL    DG Cervical Spine 1 View  Result Date: 09/18/2022 CLINICAL DATA:  Elective surgery. EXAM: DG CERVICAL SPINE - 1 VIEW COMPARISON:  None Available. FINDINGS: Single lateral fluoroscopic spot view of the cervical spine obtained in the operating room. Anterior fusion hardware from C4 through C6 with interbody spacers. Fluoroscopy time 3 seconds. Dose 0.24 mGy. IMPRESSION: Intraoperative fluoroscopy during anterior fusion cervical fusion. Electronically Signed   By: Narda Rutherford M.D.   On: 09/18/2022 18:31   DG C-Arm 1-60 Min-No Report  Result Date: 09/18/2022 Fluoroscopy was utilized by the requesting physician.  No radiographic interpretation.   DG C-Arm 1-60 Min-No Report  Result Date: 09/18/2022 Fluoroscopy was utilized by the requesting physician.  No radiographic interpretation.   CUP PACEART REMOTE DEVICE  CHECK  Result Date: 08/26/2022 Scheduled remote reviewed. Normal device function.  Next remote 91 days. Hassell Halim, RN, CCDS, [employer]   Antibiotics:  Anti-infectives (From admission, onward)    Start     Dose/Rate Route Frequency Ordered Stop   09/18/22 1600  ceFAZolin (ANCEF) IVPB 2g/100 mL premix        2 g 200 mL/hr over 30 Minutes Intravenous Every 8 hours 09/18/22 1132 09/19/22 0102   09/18/22 0630  ceFAZolin (ANCEF) IVPB 2g/100 mL premix        2 g 200 mL/hr over 30 Minutes Intravenous On call to O.R. 09/18/22 0551 09/18/22 0803   09/18/22 0601  ceFAZolin (ANCEF) 2-4 GM/100ML-% IVPB       Note to Pharmacy: Kandice Hams D: cabinet override      09/18/22 0601 09/18/22 1814       Discharge Exam: Blood pressure 107/73, pulse 74, temperature 98.2 F (36.8 C), temperature source Oral, resp. rate 16, height 5\' 2"  (1.575 m), weight 59.9 kg, SpO2 98 %. Neurologic: Grossly normal Ambulating and voiding well incision cdi   Discharge Medications:   Allergies as of 09/19/2022   No Known Allergies      Medication List     STOP taking these medications    apixaban 5 MG Tabs tablet Commonly known as: ELIQUIS       TAKE these medications    acetaminophen 325 MG tablet Commonly known as: TYLENOL Take 650 mg by mouth every 6 (six) hours as needed for moderate pain or mild pain.   Advanced Joint Relief Caps Take 1 capsule by mouth daily. Joint Advantage Gold 5x   albuterol 108 (90  Base) MCG/ACT inhaler Commonly known as: VENTOLIN HFA Inhale 1-2 puffs into the lungs every 6 (six) hours as needed for wheezing or shortness of breath.   Anoro Ellipta 62.5-25 MCG/ACT Aepb Generic drug: umeclidinium-vilanterol TAKE 1 PUFF BY MOUTH EVERY DAY   ascorbic acid 500 MG tablet Commonly known as: VITAMIN C Take 500 mg by mouth daily.   benzonatate 200 MG capsule Commonly known as: TESSALON TAKE 1 CAPSULE (200 MG TOTAL) BY MOUTH EVERY 6 (SIX) HOURS AS NEEDED FOR  COUGH.   calcium carbonate 1250 (500 Ca) MG tablet Commonly known as: OS-CAL - dosed in mg of elemental calcium Take 1 tablet by mouth every evening.   cyclobenzaprine 10 MG tablet Commonly known as: FLEXERIL Take 1 tablet (10 mg total) by mouth 3 (three) times daily as needed for muscle spasms.   empagliflozin 10 MG Tabs tablet Commonly known as: JARDIANCE Take 1 tablet (10 mg total) by mouth daily before breakfast.   fluticasone 50 MCG/ACT nasal spray Commonly known as: FLONASE Place 2 sprays into both nostrils as needed for allergies or rhinitis.   folic acid 1 MG tablet Commonly known as: FOLVITE Take 1 mg by mouth every evening.   furosemide 20 MG tablet Commonly known as: LASIX TAKE 1 TABLET (20 MG) BY MOUTH DAILY, TAKE 1 EXTRA TABLET BY MOUTH (20 MG) AS NEEDED FOR INCREASED SWELLING.   gabapentin 300 MG capsule Commonly known as: NEURONTIN Take 900 mg by mouth at bedtime.   HYDROcodone-acetaminophen 5-325 MG tablet Commonly known as: NORCO/VICODIN Take 1-2 tablets by mouth every 4 (four) hours as needed for severe pain ((score 7 to 10)).   metoprolol succinate 25 MG 24 hr tablet Commonly known as: Toprol XL Take 1 tablet (25 mg total) by mouth daily.   multivitamin with minerals Tabs tablet Take 1 tablet by mouth daily.   predniSONE 20 MG tablet Commonly known as: DELTASONE As needed if bronchitis recurs. Take 40 mg once daily for 5 days.   Restasis 0.05 % ophthalmic emulsion Generic drug: cycloSPORINE Place 1 drop into both eyes 2 (two) times daily.   triamcinolone cream 0.1 % Commonly known as: KENALOG Apply 1 Application topically daily as needed (irritation).   Vitamin D 50 MCG (2000 UT) tablet Take 4,000 Units by mouth every evening.   vitamin E 180 MG (400 UNITS) capsule Take 400 Units by mouth daily.   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, C5-6  Discharge Instructions      Remove dressing in 72 hours   Complete by: As directed    Call MD for:  difficulty breathing, headache or visual disturbances   Complete by: As directed    Call MD for:  hives   Complete by: As directed    Call MD for:  persistant dizziness or light-headedness   Complete by: As directed    Call MD for:  persistant nausea and vomiting   Complete by: As directed    Call MD for:  redness, tenderness, or signs of infection (pain, swelling, redness, odor or green/yellow discharge around incision site)   Complete by: As directed    Call MD for:  severe uncontrolled pain   Complete by: As directed    Call MD for:  temperature >100.4   Complete by: As directed    Diet - low sodium  heart healthy   Complete by: As directed    Driving Restrictions   Complete by: As directed    No driving for 2 weeks, no riding in the car for 1 week   Increase activity slowly   Complete by: As directed    Lifting restrictions   Complete by: As directed    No lifting more than 8 lbs          Signed: Tiana Loft Marelly Wehrman 09/19/2022, 7:55 AM

## 2022-09-19 NOTE — Plan of Care (Signed)
Pt doing well. Pt and husband given D/C instructions with verbal understanding. Rx's were sent to the pharmacy by MD. Pt's incision is clean and dry with no sign of infection. Pt's IV was removed prior to D/C. Pt D/C'd home via wheelchair per MD order. Pt is stable @ D/C and has no other needs at this time. Mahek Schlesinger, RN  

## 2022-09-22 ENCOUNTER — Other Ambulatory Visit: Payer: Self-pay

## 2022-09-23 ENCOUNTER — Encounter (HOSPITAL_BASED_OUTPATIENT_CLINIC_OR_DEPARTMENT_OTHER): Payer: PPO | Admitting: Pulmonary Disease

## 2022-09-23 NOTE — Progress Notes (Signed)
Remote pacemaker transmission.   

## 2022-09-25 ENCOUNTER — Ambulatory Visit (INDEPENDENT_AMBULATORY_CARE_PROVIDER_SITE_OTHER): Payer: PPO

## 2022-09-25 VITALS — Ht 62.0 in | Wt 136.0 lb

## 2022-09-25 DIAGNOSIS — Z0000 Encounter for general adult medical examination without abnormal findings: Secondary | ICD-10-CM | POA: Diagnosis not present

## 2022-09-25 NOTE — Patient Instructions (Addendum)
Sheila Morales , Thank you for taking time to come for your Medicare Wellness Visit. I appreciate your ongoing commitment to your health goals. Please review the following plan we discussed and let me know if I can assist you in the future.   These are the goals we discussed:  Goals      CCM Expected Outcome:  Monitor, Self-Manage and Reduce Symptoms of Heart Failure     *Relieving fluid overload symptoms. *Relieving symptoms of anxiety and fatigue. *Promoting physical activity. *Increasing medication compliance. *Decreasing adverse effects of treatment. *Teaching patients about dietary restrictions. *Teaching patient about self-monitoring of symptoms. *Teaching patient about daily weight monitoring        This is a list of the screening recommended for you and due dates:  Health Maintenance  Topic Date Due   COVID-19 Vaccine (1) Never done   Zoster (Shingles) Vaccine (1 of 2) Never done   Mammogram  06/19/2023*   Flu Shot  11/21/2022   Medicare Annual Wellness Visit  09/25/2023   Colon Cancer Screening  07/04/2024   DTaP/Tdap/Td vaccine (2 - Td or Tdap) 03/28/2029   Pneumonia Vaccine  Completed   DEXA scan (bone density measurement)  Completed   Hepatitis C Screening  Completed   HPV Vaccine  Aged Out  *Topic was postponed. The date shown is not the original due date.    Advanced directives: Yes  Conditions/risks identified: Yes; Congestive Heart Failure  Next appointment: Follow up in one year for your annual wellness visit.   Preventive Care 49 Years and Older, Female Preventive care refers to lifestyle choices and visits with your health care provider that can promote health and wellness. What does preventive care include? A yearly physical exam. This is also called an annual well check. Dental exams once or twice a year. Routine eye exams. Ask your health care provider how often you should have your eyes checked. Personal lifestyle choices, including: Daily care of  your teeth and gums. Regular physical activity. Eating a healthy diet. Avoiding tobacco and drug use. Limiting alcohol use. Practicing safe sex. Taking low-dose aspirin every day. Taking vitamin and mineral supplements as recommended by your health care provider. What happens during an annual well check? The services and screenings done by your health care provider during your annual well check will depend on your age, overall health, lifestyle risk factors, and family history of disease. Counseling  Your health care provider may ask you questions about your: Alcohol use. Tobacco use. Drug use. Emotional well-being. Home and relationship well-being. Sexual activity. Eating habits. History of falls. Memory and ability to understand (cognition). Work and work Astronomer. Reproductive health. Screening  You may have the following tests or measurements: Height, weight, and BMI. Blood pressure. Lipid and cholesterol levels. These may be checked every 5 years, or more frequently if you are over 28 years old. Skin check. Lung cancer screening. You may have this screening every year starting at age 61 if you have a 30-pack-year history of smoking and currently smoke or have quit within the past 15 years. Fecal occult blood test (FOBT) of the stool. You may have this test every year starting at age 44. Flexible sigmoidoscopy or colonoscopy. You may have a sigmoidoscopy every 5 years or a colonoscopy every 10 years starting at age 2. Hepatitis C blood test. Hepatitis B blood test. Sexually transmitted disease (STD) testing. Diabetes screening. This is done by checking your blood sugar (glucose) after you have not eaten for a while (  fasting). You may have this done every 1-3 years. Bone density scan. This is done to screen for osteoporosis. You may have this done starting at age 75. Mammogram. This may be done every 1-2 years. Talk to your health care provider about how often you should  have regular mammograms. Talk with your health care provider about your test results, treatment options, and if necessary, the need for more tests. Vaccines  Your health care provider may recommend certain vaccines, such as: Influenza vaccine. This is recommended every year. Tetanus, diphtheria, and acellular pertussis (Tdap, Td) vaccine. You may need a Td booster every 10 years. Zoster vaccine. You may need this after age 4. Pneumococcal 13-valent conjugate (PCV13) vaccine. One dose is recommended after age 73. Pneumococcal polysaccharide (PPSV23) vaccine. One dose is recommended after age 36. Talk to your health care provider about which screenings and vaccines you need and how often you need them. This information is not intended to replace advice given to you by your health care provider. Make sure you discuss any questions you have with your health care provider. Document Released: 05/05/2015 Document Revised: 12/27/2015 Document Reviewed: 02/07/2015 Elsevier Interactive Patient Education  2017 [employer].  Fall Prevention in the Home Falls can cause injuries. They can happen to people of all ages. There are many things you can do to make your home safe and to help prevent falls. What can I do on the outside of my home? Regularly fix the edges of walkways and driveways and fix any cracks. Remove anything that might make you trip as you walk through a door, such as a raised step or threshold. Trim any bushes or trees on the path to your home. Use bright outdoor lighting. Clear any walking paths of anything that might make someone trip, such as rocks or tools. Regularly check to see if handrails are loose or broken. Make sure that both sides of any steps have handrails. Any raised decks and porches should have guardrails on the edges. Have any leaves, snow, or ice cleared regularly. Use sand or salt on walking paths during winter. Clean up any spills in your garage right away. This  includes oil or grease spills. What can I do in the bathroom? Use night lights. Install grab bars by the toilet and in the tub and shower. Do not use towel bars as grab bars. Use non-skid mats or decals in the tub or shower. If you need to sit down in the shower, use a plastic, non-slip stool. Keep the floor dry. Clean up any water that spills on the floor as soon as it happens. Remove soap buildup in the tub or shower regularly. Attach bath mats securely with double-sided non-slip rug tape. Do not have throw rugs and other things on the floor that can make you trip. What can I do in the bedroom? Use night lights. Make sure that you have a light by your bed that is easy to reach. Do not use any sheets or blankets that are too big for your bed. They should not hang down onto the floor. Have a firm chair that has side arms. You can use this for support while you get dressed. Do not have throw rugs and other things on the floor that can make you trip. What can I do in the kitchen? Clean up any spills right away. Avoid walking on wet floors. Keep items that you use a lot in easy-to-reach places. If you need to reach something above you, use  a strong step stool that has a grab bar. Keep electrical cords out of the way. Do not use floor polish or wax that makes floors slippery. If you must use wax, use non-skid floor wax. Do not have throw rugs and other things on the floor that can make you trip. What can I do with my stairs? Do not leave any items on the stairs. Make sure that there are handrails on both sides of the stairs and use them. Fix handrails that are broken or loose. Make sure that handrails are as long as the stairways. Check any carpeting to make sure that it is firmly attached to the stairs. Fix any carpet that is loose or worn. Avoid having throw rugs at the top or bottom of the stairs. If you do have throw rugs, attach them to the floor with carpet tape. Make sure that you  have a light switch at the top of the stairs and the bottom of the stairs. If you do not have them, ask someone to add them for you. What else can I do to help prevent falls? Wear shoes that: Do not have high heels. Have rubber bottoms. Are comfortable and fit you well. Are closed at the toe. Do not wear sandals. If you use a stepladder: Make sure that it is fully opened. Do not climb a closed stepladder. Make sure that both sides of the stepladder are locked into place. Ask someone to hold it for you, if possible. Clearly mark and make sure that you can see: Any grab bars or handrails. First and last steps. Where the edge of each step is. Use tools that help you move around (mobility aids) if they are needed. These include: Canes. Walkers. Scooters. Crutches. Turn on the lights when you go into a dark area. Replace any light bulbs as soon as they burn out. Set up your furniture so you have a clear path. Avoid moving your furniture around. If any of your floors are uneven, fix them. If there are any pets around you, be aware of where they are. Review your medicines with your doctor. Some medicines can make you feel dizzy. This can increase your chance of falling. Ask your doctor what other things that you can do to help prevent falls. This information is not intended to replace advice given to you by your health care provider. Make sure you discuss any questions you have with your health care provider. Document Released: 02/02/2009 Document Revised: 09/14/2015 Document Reviewed: 05/13/2014 Elsevier Interactive Patient Education  2017 [employer].

## 2022-09-25 NOTE — Progress Notes (Addendum)
I connected with  Sheila Morales on 09/25/22 by a audio enabled telemedicine application and verified that I am speaking with the correct person using two identifiers.  Patient Location: Home  Provider Location: Office/Clinic  I discussed the limitations of evaluation and management by telemedicine. The patient expressed understanding and agreed to proceed.  Patient Medicare AWV questionnaire was completed by the patient on 09/23/2022; I have confirmed that all information answered by patient is correct and no changes since this date.     Subjective:   Sheila Morales is a 72 y.o. female who presents for Medicare Annual (Subsequent) preventive examination.  Review of Systems     Cardiac Risk Factors include: advanced age (>25men, >92 women);family history of premature cardiovascular disease;hypertension     Objective:    Today's Vitals   09/25/22 0953 09/25/22 0954  Weight: 136 lb (61.7 kg)   Height: 5\' 2"  (1.575 m)   PainSc: 0-No pain 0-No pain   Body mass index is 24.87 kg/m.     09/25/2022    9:47 AM 09/18/2022    6:12 AM 09/10/2022    9:14 AM 02/21/2022   10:12 AM 10/24/2021    5:57 PM 09/21/2021    1:36 PM 08/07/2021    1:33 PM  Advanced Directives  Does Patient Have a Medical Advance Directive? Yes No No No No Yes Yes  Type of Estate agent of [place];Living will     Healthcare Power of [place];Living will   Copy of Healthcare Power of Attorney in Chart? No - copy requested     No - copy requested   Would patient like information on creating a medical advance directive? No - Patient declined No - Patient declined No - Patient declined No - Patient declined No - Patient declined      Current Medications (verified) Outpatient Encounter Medications as of 09/25/2022  Medication Sig   acetaminophen (TYLENOL) 325 MG tablet Take 650 mg by mouth every 6 (six) hours as needed for moderate pain or mild pain.   albuterol (VENTOLIN HFA) 108 (90 Base) MCG/ACT inhaler  Inhale 1-2 puffs into the lungs every 6 (six) hours as needed for wheezing or shortness of breath.   benzonatate (TESSALON) 200 MG capsule TAKE 1 CAPSULE (200 MG TOTAL) BY MOUTH EVERY 6 (SIX) HOURS AS NEEDED FOR COUGH.   calcium carbonate (OS-CAL - DOSED IN MG OF ELEMENTAL CALCIUM) 1250 (500 Ca) MG tablet Take 1 tablet by mouth every evening.   Cholecalciferol (VITAMIN D) 50 MCG (2000 UT) tablet Take 4,000 Units by mouth every evening.   cyclobenzaprine (FLEXERIL) 10 MG tablet Take 1 tablet (10 mg total) by mouth 3 (three) times daily as needed for muscle spasms.   empagliflozin (JARDIANCE) 10 MG TABS tablet Take 1 tablet (10 mg total) by mouth daily before breakfast.   fluticasone (FLONASE) 50 MCG/ACT nasal spray Place 2 sprays into both nostrils as needed for allergies or rhinitis.   folic acid (FOLVITE) 1 MG tablet Take 1 mg by mouth every evening.   furosemide (LASIX) 20 MG tablet TAKE 1 TABLET (20 MG) BY MOUTH DAILY, TAKE 1 EXTRA TABLET BY MOUTH (20 MG) AS NEEDED FOR INCREASED SWELLING.   gabapentin (NEURONTIN) 300 MG capsule Take 900 mg by mouth at bedtime.   HYDROcodone-acetaminophen (NORCO/VICODIN) 5-325 MG tablet Take 1-2 tablets by mouth every 4 (four) hours as needed for severe pain ((score 7 to 10)).   metoprolol succinate (TOPROL XL) 25 MG 24 hr tablet Take  1 tablet (25 mg total) by mouth daily.   Misc Natural Products (ADVANCED JOINT RELIEF) CAPS Take 1 capsule by mouth daily. Joint Advantage Gold 5x   Multiple Vitamin (MULTIVITAMIN WITH MINERALS) TABS tablet Take 1 tablet by mouth daily.   predniSONE (DELTASONE) 20 MG tablet As needed if bronchitis recurs. Take 40 mg once daily for 5 days. (Patient not taking: Reported on 09/06/2022)   RESTASIS 0.05 % ophthalmic emulsion Place 1 drop into both eyes 2 (two) times daily.   triamcinolone cream (KENALOG) 0.1 % Apply 1 Application topically daily as needed (irritation).   umeclidinium-vilanterol (ANORO ELLIPTA) 62.5-25 MCG/ACT AEPB TAKE 1  PUFF BY MOUTH EVERY DAY   vitamin C (ASCORBIC ACID) 500 MG tablet Take 500 mg by mouth daily.   vitamin E 180 MG (400 UNITS) capsule Take 400 Units by mouth daily.   zolpidem (AMBIEN) 5 MG tablet TAKE 1 TABLET BY MOUTH AT BEDTIME AS NEEDED FOR SLEEP   No facility-administered encounter medications on file as of 09/25/2022.    Allergies (verified) Patient has no known allergies.   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    Past Surgical History:  Procedure Laterality Date   ANTERIOR CERVICAL DECOMP/DISCECTOMY FUSION N/A 09/18/2022   Procedure: Anterior Cervical Discectomy Fusion Cervical Four-Cervical Five, Cervical Five-Cervical Six;  Surgeon: Donalee Citrin, MD;  Location: [facility] OR;  Service: Neurosurgery;  Laterality: N/A;   CHOLECYSTECTOMY     CHOLECYSTECTOMY, LAPAROSCOPIC     COLONOSCOPY     ESOPHAGOGASTRODUODENOSCOPY     lesion, vulva excision     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   Procedure: PACEMAKER IMPLANT;  Surgeon: Marinus Maw, MD;  Location: [facility];  Service: Cardiovascular;  Laterality: N/A;   TONSILLECTOMY     VIDEO BRONCHOSCOPY Bilateral 11/06/2017   Procedure: VIDEO BRONCHOSCOPY WITHOUT FLUORO;  Surgeon: Chilton Greathouse, MD;  Location: [facility] ENDOSCOPY;  Service: Cardiopulmonary;  Laterality: Bilateral;   Family History  Problem Relation Age of Onset   Stroke Mother        Deceased,  1s   Atrial fibrillation Father    Colon cancer Father    Dementia Sister    Other Sister        Pick's disease, deceased   Healthy Daughter    Social History   Socioeconomic History   Marital status: Married    Spouse name: [name]   Number of children: 2   Years of education: 12   Highest education level: Not on file  Occupational History   Occupation: Education officer, environmental business   Occupation: caregiver  Tobacco Use   Smoking status: Former    Packs/day: 0.25    Years: 30.00    Additional pack years: 0.00    Total pack years: 7.50    Types: Cigarettes  Quit date: 01/21/2020    Years since quitting: 2.6   Smokeless tobacco: Never   Tobacco comments:    smokes "every now and then" 08/24/20 ARJ   Vaping Use   Vaping Use: Never used  Substance and Sexual Activity   Alcohol use: Not Currently    Alcohol/week: 5.0 standard drinks of alcohol    Types: 5 Standard drinks or equivalent per week    Comment: 4 out of 7 dyas per husband at bedside   Drug use: No   Sexual activity: Yes  Other Topics Concern   Not on file  Social History Narrative   HSG. Married '71. 1 daughter- '74, '78; 2 grand daughters.Work: International aid/development worker. Lives with husband and mother-in-law is moving in after rehab.       One level home with spouse   Right handed   Caffeine - coffee 2-3 cups am   Exercise - treadmill    Education - 12th grade   Social Determinants of Health   Financial Resource Strain: Low Risk  (09/25/2022)   Overall Financial Resource Strain (CARDIA)    Difficulty of Paying Living Expenses: Not hard at all  Food Insecurity: No Food Insecurity (09/25/2022)   Hunger Vital Sign    Worried About Running Out of Food in the Last Year: Never true    Ran Out of Food in the Last Year: Never true  Transportation Needs: No Transportation Needs (09/25/2022)   PRAPARE - Administrator, Civil Service (Medical): No    Lack of Transportation (Non-Medical): No  Physical Activity:  Insufficiently Active (09/25/2022)   Exercise Vital Sign    Days of Exercise per Week: 2 days    Minutes of Exercise per Session: 50 min  Stress: No Stress Concern Present (09/25/2022)   [employer] of Occupational Health - Occupational Stress Questionnaire    Feeling of Stress : Only a little  Social Connections: Unknown (09/25/2022)   Social Connection and Isolation Panel [NHANES]    Frequency of Communication with Friends and Family: More than three times a week    Frequency of Social Gatherings with Friends and Family: Once a week    Attends Religious Services: Not on Marketing executive or Organizations: Yes    Attends Banker Meetings: More than 4 times per year    Marital Status: Married    Tobacco Counseling Counseling given: Not Answered Tobacco comments: smokes "every now and then" 08/24/20 ARJ    Clinical Intake:  Pre-visit preparation completed: Yes  Pain : No/denies pain Pain Score: 0-No pain     BMI - recorded: 24.87 Nutritional Status: BMI of 19-24  Normal Nutritional Risks: None Diabetes: No  How often do you need to have someone help you when you read instructions, pamphlets, or other written materials from your doctor or pharmacy?: 1 - Never What is the last grade level you completed in school?: HSG  Diabetic? No  Interpreter Needed?: No  Information entered by :: Sheriden Archibeque N. Eustace Hur, LPN.   Activities of Daily Living    09/25/2022   10:07 AM 09/23/2022   10:47 AM  In your present state of health, do you have any difficulty performing the following activities:  Hearing? 1 1  Vision? 0 0  Difficulty concentrating or making decisions? 0 0  Walking or climbing stairs? 0 0  Dressing or bathing? 0 0  Doing errands, shopping? 0 0  Preparing Food and eating ? N N  Using the Toilet? N N  In the past six months, have you accidently leaked urine? N N  Do you have problems with loss of bowel control? N N  Managing your  Medications? N N  Managing your Finances? N N  Housekeeping or managing your Housekeeping? Malvin Johns    Patient Care Team: Myrlene Broker, MD as PCP - General (Internal Medicine) [name], MD as PCP - Cardiology (Cardiology) Marinus Maw, MD as PCP - Electrophysiology (Cardiology) Zenovia Jordan, MD (Rheumatology) Miguel Aschoff, MD (Inactive) (Obstetrics and Gynecology) Glendale Chard, DO as Consulting Physician (Neurology) Sherrie George, MD as Consulting Physician (Ophthalmology)  Indicate any recent Medical Services you may have received from other than [facility] providers in the past year (date may be approximate).     Assessment:   This is a routine wellness examination for Kariya.  Hearing/Vision screen Hearing Screening - Comments:: Patient has hearing difficulty.   No hearing aids.  Vision Screening - Comments:: Patient does wear corrective lenses/contacts/otc readers.  Eye exam done by: Alan Mulder, MD.   Dietary issues and exercise activities discussed: Current Exercise Habits: Structured exercise class (Physical Therapy), Type of exercise: Other - see comments (Physical Therapy), Time (Minutes): 50, Frequency (Times/Week): 2, Weekly Exercise (Minutes/Week): 100, Intensity: Mild, Exercise limited by: cardiac condition(s);respiratory conditions(s)   Goals Addressed             This Visit's Progress    CCM Expected Outcome:  Monitor, Self-Manage and Reduce Symptoms of Heart Failure       *Relieving fluid overload symptoms. *Relieving symptoms of anxiety and fatigue. *Promoting physical activity. *Increasing medication compliance. *Decreasing adverse effects of treatment. *Teaching patients about dietary restrictions. *Teaching patient about self-monitoring of symptoms. *Teaching patient about daily weight monitoring      Depression Screen    09/25/2022   10:03 AM 06/18/2022    3:09 PM 09/21/2021    1:37 PM 09/21/2021    1:35 PM 09/18/2021    9:53 AM  08/14/2021    9:38 AM 04/10/2021    9:16 AM  PHQ 2/9 Scores  PHQ - 2 Score 0 0 0 0 3 1 0  PHQ- 9 Score 0 0   9 5     Fall Risk    09/25/2022   10:06 AM 09/23/2022   10:47 AM 06/18/2022    3:09 PM 09/21/2021    1:37 PM 09/18/2021    9:49 AM  Fall Risk   Falls in the past year? 0 0 0 1 1  Number falls in past yr: 0  0 0 1  Injury with Fall? 0  0 1 1  Risk for fall due to : No Fall Risks    History of fall(s);Impaired balance/gait  Follow up Falls prevention discussed  Falls evaluation completed Falls evaluation completed;Education provided     FALL RISK PREVENTION PERTAINING TO THE HOME:  Any stairs in or around the home? No  If so, are there any without handrails? No  Home free of loose throw rugs in walkways, pet beds, electrical cords, etc? Yes  Adequate lighting in your home to reduce risk of falls? Yes   ASSISTIVE DEVICES UTILIZED TO PREVENT FALLS:  Life alert? No  Use of a cane, walker or w/c? Yes  Grab bars in the bathroom? No  Shower chair or bench in shower? Yes  Elevated toilet seat or a handicapped toilet? Yes   TIMED UP AND GO:  Was the test performed? No .  Telephonic Visit  Cognitive Function:        09/25/2022   10:09 AM  6CIT Screen  What Year? 0 points  What month? 0 points  What time? 0 points  Count back from 20 0 points  Months in reverse 0 points  Repeat phrase 0 points  Total Score 0 points    Immunizations Immunization History  Administered Date(s) Administered   Fluad Quad(high Dose 65+) 03/29/2019   H1N1 05/31/2008   Influenza Split 02/13/2012   Influenza Whole 01/21/2008, 01/17/2009   Influenza, High Dose Seasonal PF 02/19/2016, 02/05/2018   Influenza,inj,Quad PF,6+ Mos 01/21/2014   Influenza-Unspecified 02/02/2015, 02/03/2017   Pneumococcal Conjugate-13 02/19/2016   Pneumococcal Polysaccharide-23 02/24/2017   Tdap 03/29/2019    TDAP status: Up to date  Flu Vaccine status: Declined, Education has been provided regarding the  importance of this vaccine but patient still declined. Advised may receive this vaccine at local pharmacy or Health Dept. Aware to provide a copy of the vaccination record if obtained from local pharmacy or Health Dept. Verbalized acceptance and understanding.  Pneumococcal vaccine status: Up to date  Covid-19 vaccine status: Declined, Education has been provided regarding the importance of this vaccine but patient still declined. Advised may receive this vaccine at local pharmacy or Health Dept.or vaccine clinic. Aware to provide a copy of the vaccination record if obtained from local pharmacy or Health Dept. Verbalized acceptance and understanding.  Qualifies for Shingles Vaccine? Yes   Zostavax completed No   Shingrix Completed?: No.    Education has been provided regarding the importance of this vaccine. Patient has been advised to call insurance company to determine out of pocket expense if they have not yet received this vaccine. Advised may also receive vaccine at local pharmacy or Health Dept. Verbalized acceptance and understanding.  Screening Tests Health Maintenance  Topic Date Due   COVID-19 Vaccine (1) Never done   Zoster Vaccines- Shingrix (1 of 2) Never done   MAMMOGRAM  06/19/2023 (Originally 02/26/2017)   INFLUENZA VACCINE  11/21/2022   Medicare Annual Wellness (AWV)  09/25/2023   Colonoscopy  07/04/2024   DTaP/Tdap/Td (2 - Td or Tdap) 03/28/2029   Pneumonia Vaccine 82+ Years old  Completed   DEXA SCAN  Completed   Hepatitis C Screening  Completed   HPV VACCINES  Aged Out    Health Maintenance  Health Maintenance Due  Topic Date Due   COVID-19 Vaccine (1) Never done   Zoster Vaccines- Shingrix (1 of 2) Never done    Colorectal cancer screening: Type of screening: Colonoscopy. Completed 07/05/2014. Repeat every 10 years  Mammogram status: Postponed until 2025  Bone Density status: Completed 04/06/2019. Results reflect: Bone density results: OSTEOPENIA. Repeat every  5 years.  Lung Cancer Screening: (Low Dose CT Chest recommended if Age 52-80 years, 30 pack-year currently smoking OR have quit w/in 15years.) does not qualify.   Lung Cancer Screening Referral: no  Additional Screening:  Hepatitis C Screening: does qualify; Completed 03/29/2019  Vision Screening: Recommended annual ophthalmology exams for early detection of glaucoma and other disorders of the eye. Is the patient up to date with their annual eye exam?  Yes  Who is the provider or what is the name of the office in which the patient attends annual eye exams? Alan Mulder, MD. If pt is not established with a provider, would they like to be referred to a provider to establish care? No .   Dental Screening: Recommended annual dental exams for proper oral  hygiene  Community Resource Referral / Chronic Care Management: CRR required this visit?  No   CCM required this visit?  No      Plan:     I have personally reviewed and noted the following in the patient's chart:   Medical and social history Use of alcohol, tobacco or illicit drugs  Current medications and supplements including opioid prescriptions. Patient is currently taking opioid prescriptions. Information provided to patient regarding non-opioid alternatives. Patient advised to discuss non-opioid treatment plan with their provider. Functional ability and status Nutritional status Physical activity Advanced directives List of other physicians Hospitalizations, surgeries, and ER visits in previous 12 months Vitals Screenings to include cognitive, depression, and falls Referrals and appointments  In addition, I have reviewed and discussed with patient certain preventive protocols, quality metrics, and best practice recommendations. A written personalized care plan for preventive services as well as general preventive health recommendations were provided to patient.     Mickeal Needy, LPN   04/27/1094   Nurse Notes:  Normal cognitive status assessed by direct observation via telephone conversation by this Nurse Health Advisor. No abnormalities found.

## 2022-09-27 ENCOUNTER — Other Ambulatory Visit: Payer: Self-pay | Admitting: Internal Medicine

## 2022-09-30 ENCOUNTER — Telehealth: Payer: Self-pay

## 2022-09-30 NOTE — Telephone Encounter (Signed)
Attempted to call patient to inquire about picture. No answer, LMTCB.

## 2022-09-30 NOTE — Telephone Encounter (Signed)
The patient states her pacemaker site is darker than normal. I asked her to take a picture and send it to my chart. I told her as soon as the picture is reviewed the nurse will give her a call back.

## 2022-10-01 NOTE — Telephone Encounter (Signed)
Patient called and states she saw her PCP today and everything looked fine and she does not feel she needs to send a picture. Advised if she has any further concern to please contact us. Pt voiced understanding.

## 2022-10-02 ENCOUNTER — Other Ambulatory Visit: Payer: Self-pay | Admitting: Internal Medicine

## 2022-10-09 ENCOUNTER — Encounter: Payer: Self-pay | Admitting: Internal Medicine

## 2022-10-09 ENCOUNTER — Other Ambulatory Visit: Payer: Self-pay

## 2022-10-09 MED ORDER — GABAPENTIN 300 MG PO CAPS: ORAL_CAPSULE | ORAL | 0 refills | Status: DC

## 2022-10-19 ENCOUNTER — Other Ambulatory Visit: Payer: Self-pay | Admitting: Pulmonary Disease

## 2022-10-19 DIAGNOSIS — J3089 Other allergic rhinitis: Secondary | ICD-10-CM

## 2022-10-19 DIAGNOSIS — J040 Acute laryngitis: Secondary | ICD-10-CM

## 2022-10-31 DIAGNOSIS — M542 Cervicalgia: Secondary | ICD-10-CM | POA: Diagnosis not present

## 2022-11-06 DIAGNOSIS — I129 Hypertensive chronic kidney disease with stage 1 through stage 4 chronic kidney disease, or unspecified chronic kidney disease: Secondary | ICD-10-CM | POA: Diagnosis not present

## 2022-11-06 DIAGNOSIS — D631 Anemia in chronic kidney disease: Secondary | ICD-10-CM | POA: Diagnosis not present

## 2022-11-06 DIAGNOSIS — R809 Proteinuria, unspecified: Secondary | ICD-10-CM | POA: Diagnosis not present

## 2022-11-06 DIAGNOSIS — E871 Hypo-osmolality and hyponatremia: Secondary | ICD-10-CM | POA: Diagnosis not present

## 2022-11-06 DIAGNOSIS — J449 Chronic obstructive pulmonary disease, unspecified: Secondary | ICD-10-CM | POA: Diagnosis not present

## 2022-11-06 DIAGNOSIS — I480 Paroxysmal atrial fibrillation: Secondary | ICD-10-CM | POA: Diagnosis not present

## 2022-11-06 DIAGNOSIS — N1831 Chronic kidney disease, stage 3a: Secondary | ICD-10-CM | POA: Diagnosis not present

## 2022-11-07 LAB — LAB REPORT - SCANNED
Albumin, Urine POC: 3
Creatinine, POC: 22.7 mg/dL
Microalb Creat Ratio: 13
eGFR: 45

## 2022-11-11 ENCOUNTER — Ambulatory Visit (HOSPITAL_BASED_OUTPATIENT_CLINIC_OR_DEPARTMENT_OTHER): Payer: PPO | Admitting: Pulmonary Disease

## 2022-11-11 DIAGNOSIS — R0683 Snoring: Secondary | ICD-10-CM

## 2022-11-11 DIAGNOSIS — Z9981 Dependence on supplemental oxygen: Secondary | ICD-10-CM

## 2022-11-11 DIAGNOSIS — G4733 Obstructive sleep apnea (adult) (pediatric): Secondary | ICD-10-CM | POA: Insufficient documentation

## 2022-11-11 DIAGNOSIS — J439 Emphysema, unspecified: Secondary | ICD-10-CM

## 2022-11-11 DIAGNOSIS — J849 Interstitial pulmonary disease, unspecified: Secondary | ICD-10-CM

## 2022-11-12 DIAGNOSIS — J439 Emphysema, unspecified: Secondary | ICD-10-CM

## 2022-11-12 NOTE — Procedures (Signed)
      Patient Name: Sheila Morales, Sheila Morales Date: 11/11/2022 Gender: Female D.O.B: 1950/05/01 Age (years): 75 Referring Provider: Babette Relic Parrett Height (inches): 62 Interpreting Physician: Coralyn Helling MD, ABSM Weight (lbs): 129 RPSGT: Ulyess Mort BMI: 24 MRN: [id number] Neck Size: 13.00  CLINICAL INFORMATION Sleep Study Type: Split Night CPAP  Indication for sleep study: Congestive Heart Failure, COPD, Excessive Daytime Sleepiness, Snoring  Epworth Sleepiness Score: 11  SLEEP STUDY TECHNIQUE As per the AASM Manual for the Scoring of Sleep and Associated Events v2.3 (April 2016) with a hypopnea requiring 4% desaturations.  The channels recorded and monitored were frontal, central and occipital EEG, electrooculogram (EOG), submentalis EMG (chin), nasal and oral airflow, thoracic and abdominal wall motion, anterior tibialis EMG, snore microphone, electrocardiogram, and pulse oximetry. Continuous positive airway pressure (CPAP) was initiated when the patient met split night criteria and was titrated according to treat sleep-disordered breathing.  MEDICATIONS Medications self-administered by patient taken the night of the study : AMBIEN, TYLENOL  RESPIRATORY PARAMETERS Diagnostic  Total AHI (/hr): 35.5 RDI (/hr): 42.4 OA Index (/hr): 2.4 CA Index (/hr): 0.4 REM AHI (/hr): 48.0 NREM AHI (/hr): 34.8 Supine AHI (/hr): 52.0 Non-supine AHI (/hr): 24.1 Min O2 Sat (%): 84.0 Mean O2 (%): 92.0 Time below 88% (min): 6.8   Titration  Optimal Pressure (cm): 9 AHI at Optimal Pressure (/hr): 5.1 Min O2 at Optimal Pressure (%): 89.0 Supine % at Optimal (%): 29 Sleep % at Optimal (%): 100   SLEEP ARCHITECTURE The recording time for the entire night was 441 minutes.  During a baseline period of 255.4 minutes, the patient slept for 147.0 minutes in REM and nonREM, yielding a sleep efficiency of 57.5%. Sleep onset after lights out was 92.3 minutes with a REM latency of 59.0 minutes. The  patient spent 23.5% of the night in stage N1 sleep, 68.0% in stage N2 sleep, 0.0% in stage N3 and 8.5% in REM.  During the titration period of 182.8 minutes, the patient slept for 163.5 minutes in REM and nonREM, yielding a sleep efficiency of 89.4%. Sleep onset after CPAP initiation was 5.1 minutes with a REM latency of 41.5 minutes. The patient spent 10.7% of the night in stage N1 sleep, 64.5% in stage N2 sleep, 0.0% in stage N3 and 24.8% in REM.  CARDIAC DATA The 2 lead EKG demonstrated sinus rhythm. The mean heart rate was 100.0 beats per minute. Other EKG findings include: PVCs.  LEG MOVEMENT DATA The total Periodic Limb Movements of Sleep (PLMS) were 0. The PLMS index was 0.0 .  IMPRESSIONS - Severe obstructive sleep apnea with and AHI of 35.5  and SpO2 low of 84%. - She did well with CPAP 9 cm H2O. - She did not need supplemental oxygen during this study.  DIAGNOSIS - Obstructive Sleep Apnea (G47.33)  RECOMMENDATIONS - Trial of CPAP therapy on 9 cm H2O with a Small size Resmed Full Face AirFit F10 for Her mask and heated humidification. - Avoid alcohol, sedatives and other CNS depressants that may worsen sleep apnea and disrupt normal sleep architecture. - Sleep hygiene should be reviewed to assess factors that may improve sleep quality. - Weight management and regular exercise should be initiated or continued.  [Electronically signed] 11/12/2022 08:40 AM  Coralyn Helling MD, ABSM Diplomate, American Board of Sleep Medicine NPI: [id number]  [facility] PH: [phone]   FX: [phone] ACCREDITED BY THE AMERICAN ACADEMY OF SLEEP MEDICINE

## 2022-11-15 ENCOUNTER — Telehealth: Payer: Self-pay | Admitting: Adult Health

## 2022-11-15 ENCOUNTER — Encounter (INDEPENDENT_AMBULATORY_CARE_PROVIDER_SITE_OTHER): Payer: PPO | Admitting: Ophthalmology

## 2022-11-15 NOTE — Telephone Encounter (Signed)
Sleep study shows you have severe sleep apnea.  And would be well-controlled on CPAP.  Recommend beginning CPAP therapy.  Please make an appointment in 3 months for follow-up and evaluation response off CPAP  Order for New CPAP start  Trial of CPAP therapy on 9 cm H2O with a Small size Resmed Full Face AirFit F10 for Her mask and heated humidification.

## 2022-11-15 NOTE — Telephone Encounter (Signed)
ATC the patient. LVM for the patient to return call.

## 2022-11-18 NOTE — Telephone Encounter (Signed)
ATC the patient. I have left another message for the patient to return my call. I will try once more due to the nature of the call.

## 2022-11-19 ENCOUNTER — Other Ambulatory Visit: Payer: Self-pay | Admitting: Internal Medicine

## 2022-11-19 DIAGNOSIS — I480 Paroxysmal atrial fibrillation: Secondary | ICD-10-CM

## 2022-11-19 NOTE — Telephone Encounter (Signed)
Patient is returning phone call. Patient phone number is [phone].

## 2022-11-20 NOTE — Telephone Encounter (Signed)
Pt. Calling back again

## 2022-11-20 NOTE — Telephone Encounter (Signed)
ATC patient regarding sleep study result's .LVM for patient to call our office back

## 2022-11-22 NOTE — Telephone Encounter (Signed)
ATC the patient. LVM for the patient to return my call. 

## 2022-11-25 ENCOUNTER — Ambulatory Visit (INDEPENDENT_AMBULATORY_CARE_PROVIDER_SITE_OTHER): Payer: PPO

## 2022-11-25 DIAGNOSIS — I495 Sick sinus syndrome: Secondary | ICD-10-CM

## 2022-11-25 NOTE — Telephone Encounter (Signed)
ATC the patient. LVM for the patient to return my call. I will try one more time due to the nature of the call.

## 2022-11-26 NOTE — Telephone Encounter (Signed)
MyChart message sent to patient regarding Sleep Study result.

## 2022-11-28 ENCOUNTER — Other Ambulatory Visit: Payer: Self-pay | Admitting: *Deleted

## 2022-11-28 DIAGNOSIS — R0683 Snoring: Secondary | ICD-10-CM

## 2022-11-29 ENCOUNTER — Other Ambulatory Visit: Payer: Self-pay | Admitting: Internal Medicine

## 2022-11-29 NOTE — Telephone Encounter (Signed)
Per patient message from 11/28/2022, CPAP machine has been ordered by Ellin Saba, Ledell Peoples, RN.   Nothing further needed.

## 2022-12-05 DIAGNOSIS — M542 Cervicalgia: Secondary | ICD-10-CM | POA: Diagnosis not present

## 2022-12-05 NOTE — Progress Notes (Signed)
Remote pacemaker transmission.   

## 2022-12-06 ENCOUNTER — Encounter (INDEPENDENT_AMBULATORY_CARE_PROVIDER_SITE_OTHER): Payer: PPO | Admitting: Ophthalmology

## 2022-12-06 DIAGNOSIS — I10 Essential (primary) hypertension: Secondary | ICD-10-CM

## 2022-12-06 DIAGNOSIS — H35033 Hypertensive retinopathy, bilateral: Secondary | ICD-10-CM | POA: Diagnosis not present

## 2022-12-06 DIAGNOSIS — H353111 Nonexudative age-related macular degeneration, right eye, early dry stage: Secondary | ICD-10-CM

## 2022-12-06 DIAGNOSIS — Z79899 Other long term (current) drug therapy: Secondary | ICD-10-CM | POA: Diagnosis not present

## 2022-12-06 DIAGNOSIS — H43813 Vitreous degeneration, bilateral: Secondary | ICD-10-CM

## 2022-12-06 DIAGNOSIS — M329 Systemic lupus erythematosus, unspecified: Secondary | ICD-10-CM | POA: Diagnosis not present

## 2022-12-07 ENCOUNTER — Other Ambulatory Visit: Payer: Self-pay | Admitting: Internal Medicine

## 2022-12-07 DIAGNOSIS — I480 Paroxysmal atrial fibrillation: Secondary | ICD-10-CM

## 2022-12-09 ENCOUNTER — Telehealth: Payer: Self-pay | Admitting: Internal Medicine

## 2022-12-09 DIAGNOSIS — I480 Paroxysmal atrial fibrillation: Secondary | ICD-10-CM

## 2022-12-09 MED ORDER — APIXABAN 5 MG PO TABS
5.0000 mg | ORAL_TABLET | Freq: Two times a day (BID) | ORAL | 1 refills | Status: DC
Start: 2022-12-09 — End: 2023-06-17

## 2022-12-09 NOTE — Telephone Encounter (Signed)
*  STAT* If patient is at the pharmacy, call can be transferred to refill team.   1. Which medications need to be refilled? (please list name of each medication and dose if known)   apixaban (ELIQUIS) 5 MG TABS tablet [[id number]]   2. Would you like to learn more about the convenience, safety, & potential cost savings by using the [facility]?   3. Are you open to using the [facility] (Type [facility]. ).  4. Which pharmacy/location (including street and city if local pharmacy) is medication to be sent to?  [id number] - [place], [place] - [address]   5. Do they need a 30 day or 90 day supply? 90 day  Patient stated she still has some medication left.  Patient has appointment on 8/26.

## 2022-12-09 NOTE — Telephone Encounter (Signed)
Eliquis 5mg  refill request received. Patient is 72 years old, weight-58.5kg, Crea-1.29 on 09/10/22, Diagnosis-Afib, and last seen by Dr [name] on 08/12/22 & pending an appt with Dr. Rudene Anda on 12/16/22. Dose is appropriate based on dosing criteria. Will send in refill to requested pharmacy.    Pt held for procedure and was to resume post procedure, after procedure med was taken off med list, pt has been taking post discharge and she was to resume post procedure per clearance note on 08/28/22

## 2022-12-16 ENCOUNTER — Encounter: Payer: Self-pay | Admitting: Internal Medicine

## 2022-12-16 ENCOUNTER — Ambulatory Visit: Payer: PPO | Admitting: Internal Medicine

## 2022-12-16 VITALS — BP 103/69 | HR 66 | Ht 62.0 in | Wt 134.0 lb

## 2022-12-16 DIAGNOSIS — I480 Paroxysmal atrial fibrillation: Secondary | ICD-10-CM

## 2022-12-16 DIAGNOSIS — I5032 Chronic diastolic (congestive) heart failure: Secondary | ICD-10-CM | POA: Diagnosis not present

## 2022-12-16 DIAGNOSIS — R0609 Other forms of dyspnea: Secondary | ICD-10-CM | POA: Diagnosis not present

## 2022-12-16 DIAGNOSIS — I495 Sick sinus syndrome: Secondary | ICD-10-CM | POA: Diagnosis not present

## 2022-12-16 DIAGNOSIS — Z950 Presence of cardiac pacemaker: Secondary | ICD-10-CM

## 2022-12-16 DIAGNOSIS — E222 Syndrome of inappropriate secretion of antidiuretic hormone: Secondary | ICD-10-CM | POA: Diagnosis not present

## 2022-12-16 DIAGNOSIS — J449 Chronic obstructive pulmonary disease, unspecified: Secondary | ICD-10-CM

## 2022-12-16 DIAGNOSIS — M3213 Lung involvement in systemic lupus erythematosus: Secondary | ICD-10-CM | POA: Diagnosis not present

## 2022-12-16 DIAGNOSIS — I10 Essential (primary) hypertension: Secondary | ICD-10-CM | POA: Diagnosis not present

## 2022-12-16 DIAGNOSIS — D6869 Other thrombophilia: Secondary | ICD-10-CM | POA: Diagnosis not present

## 2022-12-16 NOTE — Patient Instructions (Signed)
Medication Instructions:  Your physician recommends that you continue on your current medications as directed. Please refer to the Current Medication list given to you today.  *If you need a refill on your cardiac medications before your next appointment, please call your pharmacy*   Follow-Up: At [facility], you and your health needs are our priority.  As part of our continuing mission to provide you with exceptional heart care, we have created designated Provider Care Teams.  These Care Teams include your primary Cardiologist (physician) and Advanced Practice Providers (APPs -  Physician Assistants and Nurse Practitioners) who all work together to provide you with the care you need, when you need it.  We recommend signing up for the patient portal called "MyChart".  Sign up information is provided on this After Visit Summary.  MyChart is used to connect with patients for Virtual Visits (Telemedicine).  Patients are able to view lab/test results, encounter notes, upcoming appointments, etc.  Non-urgent messages can be sent to your provider as well.   To learn more about what you can do with MyChart, go to [email or web address].    Your next appointment:   12 month(s)  Provider:   Gayatri A Acharya, MD    

## 2022-12-16 NOTE — Progress Notes (Signed)
Cardiology Office Note:    Date:  12/16/2022  ID:  Sheila Morales, DOB August 05, 1950, MRN [id number]  PCP:  Sheila Broker, MD  Cardiologist:  Sheila Poisson, MD  Electrophysiologist:  Sheila Bunting, MD   Referring MD: Sheila Morales, *   Chief Complaint/Reason for Referral: Bradycardia, dizziness s/p PPM  History of Present Illness:    Sheila Morales is a 72 y.o. female with a history of COPD, SLE on methorexate, BPPV, and HTN who presents for follow up. Implantation of a Biotronik dual-chamber pacemaker for symptomatic bradycardia due to sinus node dysfunction 05/26/20. Her husband Sheila Morales joins her for the visit today.  12/16/2022: Patient feels well overall and has continued to improve on heart failure therapy.  She is able to go to the gym and participate in strength training and is also walking 3 times a day up and down her driveway and feels well doing so.  She does have to stop at the end of 3 laps to catch her breath, this is a vast improvement from prior visits.  Her husband who joins her for the visit as always notes that approximately every 8 to 10 weeks she will require 3 days of increased dose of Lasix, followed by a COPD regimen.  This usually resolves her symptoms without event.  Her weights are being taken daily and these remain overall stable.  She did have cervical neck surgery in May and is recovering well from that and has been released from surgical follow-up. Tolerating heart failure therapy well.  06/17/22:  Off plaquenil due to concern for Plaquenil CM based on cardiac MRI. Followed by AHF Dr. Gasper Morales as well. Did have a hospitalization in 01/2022 with decompensated HF, requiring IV diuresis. Meds include lasix, jardience. Also on Toprol XL. [name] discussed but has not been added yet. 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 Also chronic eosinophilic PNA? Responsive to prednisone. Also with moderate-severe COPD, on Anoro. Feeling ok lately.  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 Remains limited by significant day time fatigue, sleepiness and physical weakness. We reviewed that we have not yet accomplished our sleep study, I will re-refer to PCCM for this. Dr. Okey Morales also pursuing. I strongly suspect OSA.  Known cervical neck disease, may be contributing to left arm weakness. She notes that if she is on the floor she cannot independently get up. She is working with EP.  Followed by EP, had abnormal LFTs, amiodarone has been on hold, given for PAF. We reviewed with pharmD that jardience not likely the culprit in elevated LFTs.    Prior visits: 08/08/21: Over the last several months she has had a complex history involving 2 hospitalizations.  She was initially hospitalized November 30 through December 11 with fatigue and confusion and profound hyponatremia to 113.  Noted to have E. coli bacteremia with UTI and right-sided pyelonephritis by CT imaging.  She had new onset rapid atrial fibrillation during that hospital stay and acute congestive heart failure (HFpEF), heart failure necessitating BiPAP in the intensive care unit.  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  She returned to the hospital on April 22, 2021 with fever and confusion as well as lethargy, myalgias and fatigue.  She was also noted to have intermittent fevers.  Admitted for acute metabolic encephalopathy, and also had stool positive for occult blood.  Pulmonology recommended long-term prednisone taper and PJP prophylaxis.  Patient continues on methotrexate and hydroxychloroquine for lupus.  Eliquis had been initiated during her prior hospital stay for atrial fibrillation, and at her subsequent hospitalization she was noted to have acute blood loss anemia with a hemoglobin of 7.3.  Felt to have slow gastrointestinal bleed from Eliquis.  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  She had a peripheral eosinophilia.  Symptoms respond strongly to prednisone.  Bronchoscopy considered but deferred due to tenuous clinical status in the hospital.  Consideration also for methotrexate lung toxicity as well as lupus related ILD.  She presented to the ED 07/29/2021 after a mechanical fall thought to have occurred because of ongoing nonfocal weakness and pivoting too quickly. She had fallen the previous day as well. She was noted to have two transverse process fractures as well as nondisplaced fracture of her left 11th rib. Medicated with percocet and discharged with prescription for pain medication.  Her husband provides the majority of the history today. Today, she continues to suffer from significant pain due to her rib fracture. She has tried to substitute tylenol instead of percocet, but they note tylenol is not strong enough to manage her pain. I recommended she contact her PCP for assistance with finding a medicinal alternative. She has been off of prednisone for about 3 weeks. Of note, they report she had facial edema while she was on prednisone. This has improved today. UA negative for protein recently, less likely nephritic swelling.  Generally when she is retaining fluid, she has edema in her legs. She states that her legs are constantly a little bit swollen. The edema seems to stay localized around her ankles, and has not progressed proximally. About three weeks ago she was started on a Lasix 40 mg daily and started on potassium. Since then, her husband has noticed she is more unstable, has more confusion, and is severely fatigued and short of breath with minimal exertion such as walking to this office from the parking lot. It is difficult for her to determine if she is feeling better or worse with the higher dose of Lasix. Her urine  production is only somewhat increased. Today she believes she is okay regarding fluid retention, she did take her morning dose of Lasix so far. Typically she drinks around 50 oz of water a day. Intended to follow a fluid restriction due to SIADH and hyponatremia.  Lately they have noticed some weight gain. This has been gradual in the past 7-8 weeks. They attribute her increased weight due to her recent health issues preventing her from formal exercise. She is limited to sitting due to her pain. Previously she was able to walk at 1.7 mph for 5-10 min on a treadmill, but is not able to do so at this time.  Her husband notes in the past week she has been in Atrial fibrillation. This was the first episode in a while.  Additionally she complains of sudden, intermittent episodes of body tremors that mostly affect her extremities.  Denies palpitations, PND, orthopnea.  Denies syncope or presyncope.   They plan to take a vacation to [place] in June "come hell or high water".  08/14/21:  Appears brighter and feeling slightly better particularly since decreasing lasix and rib fractures are healing. LE edema is stable with decreased dose of lasix. We discontinued potassium at last office visit and  K is stable on labs today. Sodium improved, Dr. Okey Morales told them this AM to discontinue salt tabs. Cr improved since last visit with decrease in lasix. Reviewed future testing again including CMR and PET-CT perfusion upcoming.  09/27/21:  Husband feels she has improved in the last 2 weeks.  Her weight is steadily at 126 to 128 pounds.  The compression socks worked well and currently she has no lower extremity swelling.  She has continued on Lasix 20 mg daily.  We reviewed the results of her PET/CT myocardial perfusion study which was normal. Cardiac MRI showed: "Midmyocardial circumferential stripe of delayed enhancement at the base. Midmyocardial delayed enhancement in the lateral wall at the mid ventricle.  Findings are nonspecific. Overall, findings may represent hypertensive heart disease. However, patient is chronically on plaquenil and this constellation of findings in combination with clinical presentation does not exclude plaquenil cardiomyopathy." We discussed that I will mention these findings to her rheumatologist and discuss if stopping plaquenil would be an option.  Discussed starting jardience for HFpEF symptoms.   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     Past Surgical History:  Procedure Laterality Date   ANTERIOR CERVICAL DECOMP/DISCECTOMY FUSION N/A 09/18/2022   Procedure: Anterior Cervical Discectomy Fusion Cervical Four-Cervical Five, Cervical Five-Cervical Six;  Surgeon: Donalee Citrin, MD;  Location[facility] OR;  Service: Neurosurgery;  Laterality: N/A;   CHOLECYSTECTOMY     CHOLECYSTECTOMY, LAPAROSCOPIC     COLONOSCOPY     ESOPHAGOGASTRODUODENOSCOPY     lesion, vulva excision     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   Procedure: PACEMAKER IMPLANT;  Surgeon: Marinus Maw, MD;  Location: [facility] INVASIVE CV LAB;  Service: Cardiovascular;  Laterality: N/A;   TONSILLECTOMY     VIDEO BRONCHOSCOPY Bilateral 11/06/2017   Procedure: VIDEO BRONCHOSCOPY WITHOUT FLUORO;  Surgeon: Chilton Greathouse, MD;  Location: [facility] ENDOSCOPY;  Service: Cardiopulmonary;  Laterality: Bilateral;    Current Medications: Current Meds   Medication Sig   acetaminophen (TYLENOL) 325 MG tablet Take 650 mg by mouth every 6 (six) hours as needed for moderate pain or mild pain.   albuterol (VENTOLIN HFA) 108 (90 Base) MCG/ACT inhaler Inhale 1-2 puffs into the lungs every 6 (six) hours as needed for wheezing or shortness of breath.   apixaban (ELIQUIS) 5 MG TABS tablet Take 1 tablet (5 mg total) by mouth 2 (two) times daily.   benzonatate (TESSALON) 200 MG capsule TAKE 1 CAPSULE (200 MG TOTAL) BY MOUTH EVERY 6 (SIX) HOURS AS NEEDED FOR COUGH.   calcium carbonate (OS-CAL - DOSED IN MG OF ELEMENTAL CALCIUM) 1250 (500 Ca) MG tablet Take 1 tablet by mouth every evening.   Cholecalciferol (VITAMIN D) 50 MCG (2000 UT) tablet Take 4,000 Units by mouth every evening.   empagliflozin (JARDIANCE) 10 MG TABS tablet Take 1  tablet (10 mg total) by mouth daily before breakfast.   fluticasone (FLONASE) 50 MCG/ACT nasal spray SPRAY 2 SPRAYS INTO EACH NOSTRIL EVERY DAY   furosemide (LASIX) 20 MG tablet TAKE 1 TABLET (20 MG) BY MOUTH DAILY, TAKE 1 EXTRA TABLET BY MOUTH (20 MG) AS NEEDED FOR INCREASED SWELLING.   gabapentin (NEURONTIN) 300 MG capsule TAKE TWO CAPSULES BY MOUTH EVERY MORNING, TWO CAPSULES EACH AFTERNOON AND THREE CAPSULES AT BEDTIME   metoprolol succinate (TOPROL XL) 25 MG 24 hr tablet Take 1 tablet (25 mg total) by mouth daily.   Misc Natural Products (ADVANCED JOINT RELIEF) CAPS Take 1 capsule by mouth daily. Joint Advantage Gold 5x   Multiple Vitamin (MULTIVITAMIN WITH MINERALS) TABS tablet Take 1 tablet by mouth daily.   predniSONE (DELTASONE) 20 MG tablet As needed if bronchitis recurs. Take 40 mg once daily for 5 days.   RESTASIS 0.05 % ophthalmic emulsion Place 1 drop into both eyes 2 (two) times daily.   triamcinolone cream (KENALOG) 0.1 % Apply 1 Application topically daily as needed (irritation).   umeclidinium-vilanterol (ANORO ELLIPTA) 62.5-25 MCG/ACT AEPB TAKE 1 PUFF BY MOUTH EVERY DAY   vitamin C (ASCORBIC ACID) 500 MG  tablet Take 500 mg by mouth daily.   vitamin E 180 MG (400 UNITS) capsule Take 400 Units by mouth daily.   zolpidem (AMBIEN) 5 MG tablet TAKE 1 TABLET BY MOUTH EVERY DAY AT BEDTIME AS NEEDED FOR SLEEP     Allergies:   Patient has no known allergies.   Social History   Tobacco Use   Smoking status: Former    Current packs/day: 0.00    Average packs/day: 0.3 packs/day for 30.0 years (7.5 ttl pk-yrs)    Types: Cigarettes    Start date: 01/20/1990    Quit date: 01/21/2020    Years since quitting: 2.9   Smokeless tobacco: Never   Tobacco comments:    smokes "every now and then" 08/24/20 [facility]   Vaping Use   Vaping status: Never Used  Substance Use Topics   Alcohol use: Not Currently    Alcohol/week: 5.0 standard drinks of alcohol    Types: 5 Standard drinks or equivalent per week    Comment: 4 out of 7 dyas per husband at bedside   Drug use: No     Family History: The patient's family history includes Atrial fibrillation in her father; Colon cancer in her father; Dementia in her sister; Healthy in her daughter; Other in her sister; Stroke in her mother.  ROS:   Please see the history of present illness.    (+) Left costal pain (+) Gait instability (+) Confusion (+) Fatigue (+) Shortness of breath (+) Bilateral LE edema (+) Weight gain (+) Tremors All other systems reviewed and are negative.  EKGs/Labs/Other Studies Reviewed:    The following studies were reviewed today:  Echo 03/22/2021: Sonographer Comments: Technically difficult study due to poor echo  windows.  IMPRESSIONS    1. Left ventricular ejection fraction, by estimation, is 70 to 75%. The  left ventricle has hyperdynamic function. The left ventricle has no  regional wall motion abnormalities. There is mild left ventricular  hypertrophy. Left ventricular diastolic  parameters are consistent with Grade II diastolic dysfunction  (pseudonormalization). Elevated left atrial pressure.   2. Right ventricular  systolic function is normal. The right ventricular  size is normal. There is mildly elevated pulmonary artery systolic  pressure.   3. Left atrial size was mildly dilated.   4. The mitral  valve is normal in structure. Mild mitral valve  regurgitation. No evidence of mitral stenosis.   5. The aortic valve is tricuspid. Aortic valve regurgitation is not  visualized. Aortic valve sclerosis/calcification is present, without any  evidence of aortic stenosis.   6. The inferior vena cava is normal in size with greater than 50%  respiratory variability, suggesting right atrial pressure of 3 mmHg.   Comparison(s): No significant change from prior study.  Pacemaker Implant 05/26/2020: CONCLUSIONS:   1. Successful implantation of a Biotronik dual-chamber pacemaker for symptomatic bradycardia due to sinus node dysfunction  2. No early apparent complications.    EKG:  EKG Interpretation Date/Time:  Monday December 16 2022 14:26:35 EDT Ventricular Rate:  66 PR Interval:  138 QRS Duration:  88 QT Interval:  420 QTC Calculation: 440 R Axis:   55  Text Interpretation: Atrial-paced rhythm Nonspecific ST and T wave abnormality Confirmed by Weston Brass ([id number]) on 12/16/2022 3:16:20 PM   EKG is personally reviewed and interpreted. 08/08/2021: EKG was not ordered. 07/29/2021 (ED): Atrial-paced rhythm at 66 bpm. Borderline T abnormalities, lateral leads.  Recent Labs: 02/23/2022: Magnesium 2.3 03/26/2022: B Natriuretic Peptide 705.3 09/10/2022: ALT 84; BUN 23; Creatinine, Ser 1.29; Hemoglobin 13.9; Platelets 186; Potassium 3.7; Sodium 134   Recent Lipid Panel    Component Value Date/Time   CHOL 148 03/29/2019 0909   TRIG 167 (H) 10/24/2021 2357   HDL 63.60 03/29/2019 0909   CHOLHDL 2 03/29/2019 0909   VLDL 13.2 03/29/2019 0909   LDLCALC 71 03/29/2019 0909   LDLDIRECT 52.0 02/24/2017 1351    Physical Exam:    VS:  BP 103/69 (BP Location: Right Arm, Patient Position: Sitting, Cuff Size:  Normal)   Pulse 66   Ht 5\' 2"  (1.575 m)   Wt 134 lb (60.8 kg)   SpO2 91%   BMI 24.51 kg/m     Wt Readings from Last 5 Encounters:  12/16/22 134 lb (60.8 kg)  11/11/22 129 lb (58.5 kg)  09/25/22 136 lb (61.7 kg)  09/18/22 132 lb (59.9 kg)  09/10/22 133 lb 3.2 oz (60.4 kg)    Constitutional: No acute distress Eyes: sclera non-icteric, normal conjunctiva and lids ENMT: normal dentition, moist mucous membranes Cardiovascular: regular rhythm, normal rate, no murmurs. S1 and S2 normal. No jugular venous distention.   Respiratory: clear to auscultation bilaterally GI : normal bowel sounds, soft and nontender. No distention.   MSK: extremities warm, well perfused.  Trace ankle edema.  NEURO: grossly nonfocal exam, moves all extremities. PSYCH: alert and oriented x 3, normal mood and affect.   ASSESSMENT:    1. AF (paroxysmal atrial fibrillation) (HCC)   2. Sinus node dysfunction (HCC)   3. Dyspnea on exertion   4. Chronic heart failure with preserved ejection fraction (HFpEF) (HCC)   5. Essential hypertension   6. Paroxysmal atrial fibrillation (HCC)   7. Secondary hypercoagulable state (HCC)   8. Pacemaker   9. SIADH (syndrome of inappropriate ADH production) (HCC)   10. Hypertension, unspecified type   11. Chronic obstructive pulmonary disease, unspecified COPD type (HCC)   12. Systemic lupus erythematosus with lung involvement, unspecified SLE type (HCC)    PLAN:    Dyspnea on exertion Chronic HFpEF - multifactorial dyspnea, follows with pulmonary. Class I-II today. - continue lasix, 20 mg daily and an additional dose as needed, this only occurs every 8 to 10 weeks.  She is doing very well. - PET-CT normal - cardiac MRI was negative for amyloid  but may indicate plaquenil CM vs HTN heart disease.  - jardience 10 mg daily for HFpEF, refill today.  -Edema has resolved.  Atrial fibrillation, paroxysmal Secondary hypercoagulable state -Continue Eliquis 5 mg twice daily.  Atrial paced rhythm on today's ECG. -amiodarone stopped due to elevated LFTs. Remains on metoprolol succinate 25 mg daily  Weakness Fatigue Imbalance -The symptoms have improved with exercise and strength training.  I have commended her for her efforts.  Bradycardia Dizziness Sinus node dysfunction (HCC) Pacemaker -Device interrogations have been unremarkable for cause for the above-mentioned symptoms.  Symptoms have overall improved.  Hyponatremia - felt to be SIADH.  Management per PCP.  Most recent sodium obtained in May per EMR is 134.  Patient is now using Celtic salt which she feels is easier to incorporate in her diet in moderation.  Hypertension-blood pressure mildly low today. Monitor for now.  Total time of encounter: 25 minutes total time of encounter, including 15 minutes spent in face-to-face patient care on the date of this encounter. This time includes coordination of care and counseling regarding above mentioned problem list. Remainder of non-face-to-face time involved reviewing chart documents/testing relevant to the patient encounter and documentation in the medical record. I have independently reviewed documentation from referring provider.   Weston Brass, MD, [facility]   [facility]     Medication Adjustments/Labs and Tests Ordered: Current medicines are reviewed at length with the patient today.  Concerns regarding medicines are outlined above.   Orders Placed This Encounter  Procedures   EKG 12-Lead    No orders of the defined types were placed in this encounter.   Patient Instructions  Medication Instructions:  Your physician recommends that you continue on your current medications as directed. Please refer to the Current Medication list given to you today.  *If you need a refill on your cardiac medications before your next appointment, please call your pharmacy*    Follow-Up: At [facility], you and your health needs are  our priority.  As part of our continuing mission to provide you with exceptional heart care, we have created designated Provider Care Teams.  These Care Teams include your primary Cardiologist (physician) and Advanced Practice Providers (APPs -  Physician Assistants and Nurse Practitioners) who all work together to provide you with the care you need, when you need it.  We recommend signing up for the patient portal called "MyChart".  Sign up information is provided on this After Visit Summary.  MyChart is used to connect with patients for Virtual Visits (Telemedicine).  Patients are able to view lab/test results, encounter notes, upcoming appointments, etc.  Non-urgent messages can be sent to your provider as well.   To learn more about what you can do with MyChart, go to [email or web address].    Your next appointment:   12 month(s)  Provider:   [name], MD

## 2022-12-25 DIAGNOSIS — D225 Melanocytic nevi of trunk: Secondary | ICD-10-CM | POA: Diagnosis not present

## 2022-12-25 DIAGNOSIS — D2372 Other benign neoplasm of skin of left lower limb, including hip: Secondary | ICD-10-CM | POA: Diagnosis not present

## 2022-12-25 DIAGNOSIS — L931 Subacute cutaneous lupus erythematosus: Secondary | ICD-10-CM | POA: Diagnosis not present

## 2022-12-25 DIAGNOSIS — L930 Discoid lupus erythematosus: Secondary | ICD-10-CM | POA: Diagnosis not present

## 2022-12-25 DIAGNOSIS — D692 Other nonthrombocytopenic purpura: Secondary | ICD-10-CM | POA: Diagnosis not present

## 2022-12-25 DIAGNOSIS — L821 Other seborrheic keratosis: Secondary | ICD-10-CM | POA: Diagnosis not present

## 2023-01-22 ENCOUNTER — Other Ambulatory Visit: Payer: Self-pay | Admitting: Pulmonary Disease

## 2023-01-28 DIAGNOSIS — H524 Presbyopia: Secondary | ICD-10-CM | POA: Diagnosis not present

## 2023-01-28 DIAGNOSIS — H31091 Other chorioretinal scars, right eye: Secondary | ICD-10-CM | POA: Diagnosis not present

## 2023-01-28 DIAGNOSIS — H3561 Retinal hemorrhage, right eye: Secondary | ICD-10-CM | POA: Diagnosis not present

## 2023-01-28 DIAGNOSIS — H353111 Nonexudative age-related macular degeneration, right eye, early dry stage: Secondary | ICD-10-CM | POA: Diagnosis not present

## 2023-01-28 DIAGNOSIS — Z79899 Other long term (current) drug therapy: Secondary | ICD-10-CM | POA: Diagnosis not present

## 2023-01-31 ENCOUNTER — Other Ambulatory Visit: Payer: Self-pay | Admitting: Pulmonary Disease

## 2023-02-12 ENCOUNTER — Ambulatory Visit: Payer: PPO | Admitting: Pulmonary Disease

## 2023-02-12 ENCOUNTER — Encounter: Payer: Self-pay | Admitting: Pulmonary Disease

## 2023-02-12 DIAGNOSIS — J209 Acute bronchitis, unspecified: Secondary | ICD-10-CM

## 2023-02-12 MED ORDER — ANORO ELLIPTA 62.5-25 MCG/ACT IN AEPB: 1.0000 | INHALATION_SPRAY | Freq: Every day | RESPIRATORY_TRACT | 11 refills | Status: DC

## 2023-02-12 NOTE — Progress Notes (Signed)
@Patient  ID: Sheila Morales, female    DOB: [birth year], 72 y.o.   MRN: [id number]  Chief Complaint  Patient presents with   Follow-up    Doing well.  Going to gyn 3 x/week and walking on driveway.    Referring provider: Myrlene Broker, *  HPI:   72 y.o. 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  Most recent cardiology note reviewed.  D/C summary 08/2022  Seems relatively stable over the last several months.  Sounds like CHF and volume control has been better.  Exercising more. Dyspnea improving. Doing better overall.  HPI at initial visit: Briefly, history of infiltrates in the past with area of posterior right upper lobe scarring.  This predated her amiodarone.  Also on methotrexate.  However, was hospitalized in late 2022 with volume overload and groundglass opacities favored to be most likely related to pulmonary edema.  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  She was placed on prednisone taper at discharge.  At the end of her prednisone, she developed dyspnea.  This worsened over the course of a few days.  Developed fever, came to hospital.  Was found to be hypoxemic.  Coarse interstitial infiltrates on chest x-ray on my review and interpretation.  CT chest showed worsening area of fibrotic changes in the posterior right upper lobe as well as scattered groundglass.  At time of evaluation he is on 5 L nasal cannula with accessory muscle use.  Felt too ill to safely tolerate bronchoscopy.  Recommend ongoing diuresis but started prednisone half a milligram per kilogram, 40 mg daily.  After 1 dose her fever subsided, her hypoxemia resolved, her dyspnea exertion went away.  She had peripheral eosinophilia 400-500.  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   Questionaires / Pulmonary Flowsheets:   ACT:      No data to display          MMRC:     No data to display          Epworth:      No data to display          Tests:   FENO:  No results found for: "NITRICOXIDE"  PFT:    Latest Ref Rng & Units 08/15/2022    3:53 PM 02/06/2017    3:09 PM  PFT Results  FVC-Pre L 1.81  2.14   FVC-Predicted Pre % 67  72   FVC-Post L 1.77  2.22   FVC-Predicted Post % 65  74   Pre FEV1/FVC % % 66  60   Post FEV1/FCV % % 55  59   FEV1-Pre L 1.19  1.28   FEV1-Predicted Pre % 58  56   FEV1-Post L 0.97  1.30   DLCO uncorrected ml/min/mmHg 12.08  12.45   DLCO UNC% % 66  54   DLCO corrected ml/min/mmHg 12.08  12.34   DLCO COR %Predicted % 66  53   DLVA Predicted % 79  66   TLC L 4.17  4.65   TLC % Predicted % 87  94   RV % Predicted % 95  107   Personally reviewed and interpreted 2018 PFTs where spirometry  is suggestive of moderate obstruction, no bronchodilator response, TLC within normal limits, DLCO moderately reduced 08/15/22 personally reviewed and interpreted as borderline, moderate obstruction, no bronchodilator response, lung volumes within normal limits, DLCO moderately reduced, similar, largely unchanged compared to 2018 in terms of percent predicted etc.  WALK:      No data to display          Imaging: Personally reviewed and as per EMR discussed in this note No results found.  Lab Results: Personally reviewed CBC    Component Value Date/Time   WBC 5.9 09/10/2022 1000   RBC 4.66 09/10/2022 1000   HGB 13.9 09/10/2022 1000   HGB 11.9 05/22/2021 0842   HGB 13.1 01/13/2009 1108   HCT 42.5 09/10/2022 1000   HCT 35.3 05/22/2021 0842   HCT 37.5 01/13/2009 1108   PLT 186 09/10/2022 1000   PLT 194 05/22/2021 0842   MCV 91.2 09/10/2022 1000   MCV 92 05/22/2021 0842   MCV 86.2  01/13/2009 1108   MCH 29.8 09/10/2022 1000   MCHC 32.7 09/10/2022 1000   RDW 13.6 09/10/2022 1000   RDW 14.5 05/22/2021 0842   RDW 12.2 01/13/2009 1108   LYMPHSABS 1.0 03/02/2022 0306   LYMPHSABS 1.2 05/11/2020 1203   LYMPHSABS 2.2 01/13/2009 1108   MONOABS 1.0 03/02/2022 0306   MONOABS 0.7 01/13/2009 1108   EOSABS 0.0 03/02/2022 0306   EOSABS 0.1 05/11/2020 1203   BASOSABS 0.0 03/02/2022 0306   BASOSABS 0.0 05/11/2020 1203   BASOSABS 0.0 01/13/2009 1108    BMET    Component Value Date/Time   NA 134 (L) 09/10/2022 1000   NA 135 (A) 04/17/2022 0000   K 3.7 09/10/2022 1000   CL 99 09/10/2022 1000   CO2 23 09/10/2022 1000   GLUCOSE 92 09/10/2022 1000   BUN 23 09/10/2022 1000   BUN 19 04/17/2022 0000   CREATININE 1.29 (H) 09/10/2022 1000   CREATININE 0.91 11/22/2019 1502   CALCIUM 9.3 09/10/2022 1000   GFRNONAA 44 (L) 09/10/2022 1000   GFRAA 89 05/11/2020 1203    BNP    Component Value Date/Time   BNP 705.3 (H) 03/26/2022 0951    ProBNP    Component Value Date/Time   PROBNP 1,033.0 (H) 07/09/2021 1242    Specialty Problems       Pulmonary Problems   COPD (chronic obstructive pulmonary disease) (HCC)   Interstitial lung disease (HCC)   Snoring    No Known Allergies  Immunization History  Administered Date(s) Administered   Fluad Quad(high Dose 65+) 03/29/2019   H1N1 05/31/2008   Influenza Split 02/13/2012   Influenza Whole 01/21/2008, 01/17/2009   Influenza, High Dose Seasonal PF 02/19/2016, 02/05/2018   Influenza,inj,Quad PF,6+ Mos 01/21/2014   Influenza-Unspecified 02/02/2015, 02/03/2017   Pneumococcal Conjugate-13 02/19/2016   Pneumococcal Polysaccharide-23 02/24/2017   Tdap 03/29/2019    Past Medical History:  Diagnosis Date   A-fib (HCC)    Acute respiratory failure with hypoxia (HCC) 11/05/2017   Arthritis    Cervicalgia    CHF (congestive heart failure) (HCC)    Colitis, ischemic (HCC)    COPD (chronic obstructive pulmonary disease)  (HCC)    External hemorrhoids    H/O: rheumatic fever    IBS (irritable bowel syndrome)    Pacemaker    Personal history colonic adenoma 03/25/2008   06/2007 right sided adenoma and 2 right hyperplastic polyps     Restless leg syndrome    questionable   Systemic lupus erythematosus (HCC)  Tubular adenoma of colon    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     Tobacco History: Social History   Tobacco Use  Smoking Status Former   Current packs/day: 0.00   Average packs/day: 0.3 packs/day for 30.0 years (7.5 ttl pk-yrs)   Types: Cigarettes   Start date: 01/20/1990   Quit date: 01/21/2020   Years since quitting: 3.0  Smokeless Tobacco Never  Tobacco Comments   smokes "every now and then" 08/24/20 [facility]    Counseling given: Not Answered Tobacco comments: smokes "every now and then" 08/24/20 [facility]    Continue to not smoke  Outpatient Encounter Medications as of 02/12/2023  Medication Sig   acetaminophen (TYLENOL) 325 MG tablet Take 650 mg by mouth every 6 (six) hours as needed for moderate pain or mild pain.   albuterol (VENTOLIN HFA) 108 (90 Base) MCG/ACT inhaler Inhale 1-2 puffs into the lungs every 6 (six) hours as needed for wheezing or shortness of breath.   apixaban (ELIQUIS) 5 MG TABS tablet Take 1 tablet (5 mg total) by mouth 2 (two) times daily.   benzonatate (TESSALON) 200 MG capsule TAKE 1 CAPSULE (200 MG TOTAL) BY MOUTH EVERY 6 (SIX) HOURS AS NEEDED FOR COUGH.   calcium carbonate (OS-CAL - DOSED IN MG OF ELEMENTAL CALCIUM) 1250 (500 Ca) MG tablet Take 1 tablet by mouth every evening.   Cholecalciferol (VITAMIN D) 50 MCG (2000 UT) tablet Take 4,000 Units by mouth every evening.   empagliflozin (JARDIANCE) 10 MG TABS tablet Take 1 tablet (10 mg total) by mouth daily before breakfast.   fluticasone (FLONASE) 50 MCG/ACT nasal spray SPRAY 2 SPRAYS INTO EACH NOSTRIL EVERY DAY   folic acid (FOLVITE) 1 MG tablet Take 1 mg by mouth every evening.   furosemide (LASIX) 20 MG tablet  TAKE 1 TABLET (20 MG) BY MOUTH DAILY, TAKE 1 EXTRA TABLET BY MOUTH (20 MG) AS NEEDED FOR INCREASED SWELLING.   gabapentin (NEURONTIN) 300 MG capsule TAKE TWO CAPSULES BY MOUTH EVERY MORNING, TWO CAPSULES EACH AFTERNOON AND THREE CAPSULES AT BEDTIME   metoprolol succinate (TOPROL XL) 25 MG 24 hr tablet Take 1 tablet (25 mg total) by mouth daily.   Misc Natural Products (ADVANCED JOINT RELIEF) CAPS Take 1 capsule by mouth daily. Joint Advantage Gold 5x   Multiple Vitamin (MULTIVITAMIN WITH MINERALS) TABS tablet Take 1 tablet by mouth daily.   predniSONE (DELTASONE) 20 MG tablet AS NEEDED IF BRONCHITIS RECURS. TAKE 40 MG ONCE DAILY FOR 5 DAYS.   RESTASIS 0.05 % ophthalmic emulsion Place 1 drop into both eyes 2 (two) times daily.   triamcinolone cream (KENALOG) 0.1 % Apply 1 Application topically daily as needed (irritation).   vitamin C (ASCORBIC ACID) 500 MG tablet Take 500 mg by mouth daily.   vitamin E 180 MG (400 UNITS) capsule Take 400 Units by mouth daily.   zolpidem (AMBIEN) 5 MG tablet TAKE 1 TABLET BY MOUTH EVERY DAY AT BEDTIME AS NEEDED FOR SLEEP   [DISCONTINUED] ANORO ELLIPTA 62.5-25 MCG/ACT AEPB INHALE 1 PUFF BY MOUTH EVERY DAY   umeclidinium-vilanterol (ANORO ELLIPTA) 62.5-25 MCG/ACT AEPB Inhale 1 puff into the lungs daily.   No facility-administered encounter medications on file as of 02/12/2023.     Review of Systems  Review of Systems  N/a Physical Exam  BP 124/62 (BP Location: Right Arm, Patient Position: Sitting, Cuff Size: Normal)   Pulse 69   Temp 98.1 F (36.7 C) (Oral)   Ht 5\' 2"  (1.575 m)  Wt 128 lb 12.8 oz (58.4 kg)   SpO2 99%   BMI 23.56 kg/m   Wt Readings from Last 5 Encounters:  02/12/23 128 lb 12.8 oz (58.4 kg)  12/16/22 134 lb (60.8 kg)  11/11/22 129 lb (58.5 kg)  09/25/22 136 lb (61.7 kg)  09/18/22 132 lb (59.9 kg)    BMI Readings from Last 5 Encounters:  02/12/23 23.56 kg/m  12/16/22 24.51 kg/m  11/11/22 23.59 kg/m  09/25/22 24.87 kg/m   09/18/22 24.14 kg/m     Physical Exam General: Sitting in chair, appears drowsy Pulmonary:  clear, good air movement Cardiovascular: Regular in rhythm Neuro: Normal gait, no focal weakness on exam   Assessment & Plan:   Acute hypoxemic respiratory failure with progressive chronic pulmonary infiltrates with worsened fever and cough: Concerning for underlying inflammatory lung disease, favor NSIP given pattern and groundglass opacities on imaging on admission.  Suspect prior infiltrates 03/2021 reflective of this process as well as volume overload given bilateral pleural effusions now improved.  Small area of what looks like fibrosis in the right upper lobe in 03/21/2021.  Overall pattern improved from last admission but with progressive fibrosis particular in the right upper lobe as well as bronchiectasis and bilateral but right greater than left lower lobes.  No clear signs of pulmonary infection on CT scan on my interpretation.  Fear this is early fibrosis and signs of volume loss.  She had fever, productive cough worsening hypoxemia in the setting of steroid taper.  Procalcitonin was negative.  The differential also includes methotrexate lung toxicity although the pattern does not appear classic for this.  In addition, amiodarone toxicity is possible but given initial area of fibrosis predates amiodarone and the relative short duration of amiodarone administration this is felt unlikely.  She had peripheral eosinophilia of 400 on admission.  Marked improvement and essentially resolution of fever, hypoxemia, dyspnea, cough after 1 dose of prednisone 40 mg daily.  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  Gradual improvement in symptoms with prednisone taper, chest x-ray improving 05/12/2021.  Breathing suddenly worsen later early spring 2023 felt to be largely related to volume overload given elevated BNP and improvement with diuresis.  Subsequent hospitalization 01/2022  again in the setting of volume overload.  Now felt to be euvolemic.  Symptoms improving with volume control and gradual exercise increase.  dCHF: With volume overload being bigger problem from respiratory standpoint. Now doing well. Euvolemic.  Interstitial lung disease: Upper lobe dominant fibrotic changes, groundglass in the past.  Suspicion for NSIP.  Discussed role and rationale for antifibrotic's additional work-up for possible further immunosuppression.  She has upcoming follow-up with rheumatology.  She defers antifibrotic's as she feels well, doing as well as she has in many months.  Recent DLCO actually improved since 2018.  This is encouraging.  Moderate severe COPD: Based on PTs 2018.  Repeat 2024 with improved ratio but still moderate COPD.  Trial Trelegy without real improvement.  Continue Anoro, refilled today.  Return in about 1 year (around 02/12/2024).   Karren Burly, MD 02/12/2023

## 2023-02-12 NOTE — Patient Instructions (Addendum)
Nice to see you again  No changes - Anoro refilled today  Return to clinic in 1 year or sooner as needed

## 2023-02-19 ENCOUNTER — Telehealth: Payer: Self-pay | Admitting: Pulmonary Disease

## 2023-02-19 DIAGNOSIS — J209 Acute bronchitis, unspecified: Secondary | ICD-10-CM

## 2023-02-20 MED ORDER — ALBUTEROL SULFATE HFA 108 (90 BASE) MCG/ACT IN AERS
1.0000 | INHALATION_SPRAY | Freq: Four times a day (QID) | RESPIRATORY_TRACT | 5 refills | Status: DC | PRN
Start: 2023-02-20 — End: 2024-01-19

## 2023-02-20 NOTE — Telephone Encounter (Signed)
Albuterol refill sent to requested [facility] [address]  ATC patient.  Left detailed message on VM (DPR).  Nothing further at this time.

## 2023-02-24 ENCOUNTER — Ambulatory Visit (INDEPENDENT_AMBULATORY_CARE_PROVIDER_SITE_OTHER): Payer: PPO

## 2023-02-24 DIAGNOSIS — G4733 Obstructive sleep apnea (adult) (pediatric): Secondary | ICD-10-CM | POA: Diagnosis not present

## 2023-02-24 DIAGNOSIS — I495 Sick sinus syndrome: Secondary | ICD-10-CM

## 2023-02-25 LAB — CUP PACEART REMOTE DEVICE CHECK
Battery Voltage: 75
Date Time Interrogation Session: 20241104110057
Implantable Lead Connection Status: 753985
Implantable Lead Connection Status: 753985
Implantable Lead Implant Date: 20220204
Implantable Lead Implant Date: 20220204
Implantable Lead Location: 753859
Implantable Lead Location: 753860
Implantable Lead Model: 377169
Implantable Lead Model: 377171
Implantable Lead Serial Number: 8000143637
Implantable Lead Serial Number: 8000151447
Implantable Pulse Generator Implant Date: 20220204
Pulse Gen Model: 407145
Pulse Gen Serial Number: 70020732

## 2023-02-27 ENCOUNTER — Telehealth: Payer: Self-pay

## 2023-02-27 ENCOUNTER — Encounter: Payer: Self-pay | Admitting: Internal Medicine

## 2023-02-27 ENCOUNTER — Ambulatory Visit (INDEPENDENT_AMBULATORY_CARE_PROVIDER_SITE_OTHER): Payer: PPO | Admitting: Internal Medicine

## 2023-02-27 VITALS — BP 112/60 | HR 72 | Temp 97.6°F | Ht 62.0 in | Wt 128.4 lb

## 2023-02-27 DIAGNOSIS — M3213 Lung involvement in systemic lupus erythematosus: Secondary | ICD-10-CM | POA: Diagnosis not present

## 2023-02-27 DIAGNOSIS — Z0000 Encounter for general adult medical examination without abnormal findings: Secondary | ICD-10-CM | POA: Diagnosis not present

## 2023-02-27 DIAGNOSIS — I5032 Chronic diastolic (congestive) heart failure: Secondary | ICD-10-CM

## 2023-02-27 DIAGNOSIS — I480 Paroxysmal atrial fibrillation: Secondary | ICD-10-CM

## 2023-02-27 DIAGNOSIS — F5101 Primary insomnia: Secondary | ICD-10-CM | POA: Diagnosis not present

## 2023-02-27 DIAGNOSIS — G609 Hereditary and idiopathic neuropathy, unspecified: Secondary | ICD-10-CM | POA: Diagnosis not present

## 2023-02-27 DIAGNOSIS — J439 Emphysema, unspecified: Secondary | ICD-10-CM | POA: Diagnosis not present

## 2023-02-27 DIAGNOSIS — R7989 Other specified abnormal findings of blood chemistry: Secondary | ICD-10-CM

## 2023-02-27 DIAGNOSIS — I10 Essential (primary) hypertension: Secondary | ICD-10-CM | POA: Diagnosis not present

## 2023-02-27 LAB — COMPREHENSIVE METABOLIC PANEL
ALT: 43 U/L — ABNORMAL HIGH (ref 0–35)
AST: 53 U/L — ABNORMAL HIGH (ref 0–37)
Albumin: 3.8 g/dL (ref 3.5–5.2)
Alkaline Phosphatase: 80 U/L (ref 39–117)
BUN: 21 mg/dL (ref 6–23)
CO2: 27 meq/L (ref 19–32)
Calcium: 10 mg/dL (ref 8.4–10.5)
Chloride: 100 meq/L (ref 96–112)
Creatinine, Ser: 1.26 mg/dL — ABNORMAL HIGH (ref 0.40–1.20)
GFR: 42.41 mL/min — ABNORMAL LOW (ref >60.00)
Glucose, Bld: 80 mg/dL (ref 70–99)
Potassium: 4.3 meq/L (ref 3.5–5.1)
Sodium: 133 meq/L — ABNORMAL LOW (ref 135–145)
Total Bilirubin: 0.5 mg/dL (ref 0.2–1.2)
Total Protein: 7.3 g/dL (ref 6.0–8.3)

## 2023-02-27 LAB — CBC
HCT: 43.8 % (ref 36.0–46.0)
Hemoglobin: 14.3 g/dL (ref 12.0–15.0)
MCHC: 32.7 g/dL (ref 30.0–36.0)
MCV: 91.1 fL (ref 78.0–100.0)
Platelets: 204 10*3/uL (ref 150.0–400.0)
RBC: 4.81 Mil/uL (ref 3.87–5.11)
RDW: 13.3 % (ref 11.5–15.5)
WBC: 4.8 10*3/uL (ref 4.0–10.5)

## 2023-02-27 LAB — LIPID PANEL
Cholesterol: 172 mg/dL (ref 0–200)
HDL: 55.8 mg/dL (ref >39.00)
LDL Cholesterol: 65 mg/dL (ref 0–99)
NonHDL: 115.94
Total CHOL/HDL Ratio: 3
Triglycerides: 253 mg/dL — ABNORMAL HIGH (ref 0.0–149.0)
VLDL: 50.6 mg/dL — ABNORMAL HIGH (ref 0.0–40.0)

## 2023-02-27 LAB — TSH: TSH: 1 u[IU]/mL (ref 0.35–5.50)

## 2023-02-27 LAB — VITAMIN B12: Vitamin B-12: 592 pg/mL (ref 211–911)

## 2023-02-27 LAB — FERRITIN: Ferritin: 189.4 ng/mL (ref 10.0–291.0)

## 2023-02-27 LAB — VITAMIN D 25 HYDROXY (VIT D DEFICIENCY, FRACTURES): VITD: >120 ng/mL

## 2023-02-27 NOTE — Progress Notes (Signed)
   Subjective:   Patient ID: Sheila Morales, female    DOB: 1950-11-05, 72 y.o.   MRN: [id number]  HPI The patient is here for physical.  PMH, [facility], social history reviewed and updated  Review of Systems  Constitutional: Negative.   HENT: Negative.    Eyes: Negative.   Respiratory:  Negative for cough, chest tightness and shortness of breath.   Cardiovascular:  Negative for chest pain, palpitations and leg swelling.  Gastrointestinal:  Negative for abdominal distention, abdominal pain, constipation, diarrhea, nausea and vomiting.  Musculoskeletal: Negative.   Skin: Negative.        Nail polish on the hands  Neurological: Negative.   Psychiatric/Behavioral: Negative.      Objective:  Physical Exam Constitutional:      Appearance: She is well-developed.  HENT:     Head: Normocephalic and atraumatic.  Cardiovascular:     Rate and Rhythm: Normal rate and regular rhythm.  Pulmonary:     Effort: Pulmonary effort is normal. No respiratory distress.     Breath sounds: Normal breath sounds. No wheezing or rales.     Comments: No respiratory distress Abdominal:     General: Bowel sounds are normal. There is no distension.     Palpations: Abdomen is soft.     Tenderness: There is no abdominal tenderness. There is no rebound.  Musculoskeletal:     Cervical back: Normal range of motion.  Skin:    General: Skin is warm and dry.  Neurological:     Mental Status: She is alert and oriented to person, place, and time.     Coordination: Coordination normal.     Vitals:   02/27/23 1041  BP: 112/60  Pulse: 72  Temp: 97.6 F (36.4 C)  TempSrc: Oral  SpO2: (!) 78%  Weight: 128 lb 6.4 oz (58.2 kg)  Height: 5\' 2"  (1.575 m)    Assessment & Plan:

## 2023-02-27 NOTE — Telephone Encounter (Signed)
Thank you :)

## 2023-02-27 NOTE — Telephone Encounter (Signed)
CRITICAL VALUE STICKER  CRITICAL VALUE: >120 Vit D.  MESSENGER (representative from lab): Si  MD NOTIFIED: Sagardia (office DOD)  TIME OF NOTIFICATION: [phone]

## 2023-02-27 NOTE — Patient Instructions (Signed)
We will check the labs today. 

## 2023-02-28 NOTE — Assessment & Plan Note (Signed)
No signs of fluid overload and continue lasix 20 mg daily and metoprolol and jardiance

## 2023-02-28 NOTE — Assessment & Plan Note (Signed)
Taking eliquis and metoprolol for rate control. Sounds regular on exam but EKG not done at visit. No clinical signs of bleeding. Checking CBC and CMP.

## 2023-02-28 NOTE — Assessment & Plan Note (Signed)
BP at goal on metoprolol and lasix and will continue. Checking CMP and adjust as needed.

## 2023-02-28 NOTE — Assessment & Plan Note (Signed)
Checking B12, vitamin D ,TSH and CMP and CBC to verify no additional cause.

## 2023-02-28 NOTE — Assessment & Plan Note (Signed)
Overall stable and not on medication currently.

## 2023-02-28 NOTE — Assessment & Plan Note (Signed)
Pulse ox unable to read well due to nail polish and highest was in 78%. She is not in distress and feels breathing is good as normal. Last reading 99% less than 1 week ago in pulmonary so likely spurious. Continue albuterol prn and anoro for now.

## 2023-02-28 NOTE — Assessment & Plan Note (Signed)
Flu shot declines. Pneumonia complete. Shingrix due at pharmacy. Tetanus up to date. Colonoscopy up to date. Mammogram up to date, pap smear aged out and dexa complete. Counseled about sun safety and mole surveillance. Counseled about the dangers of distracted driving. Given 10 year screening recommendations.

## 2023-02-28 NOTE — Assessment & Plan Note (Signed)
Checking CMP but per review of outside labs this was improving off methotrexate and amiodarone.

## 2023-02-28 NOTE — Assessment & Plan Note (Signed)
Uses ambien and can refill when due.

## 2023-03-07 ENCOUNTER — Other Ambulatory Visit: Payer: Self-pay | Admitting: Internal Medicine

## 2023-03-10 ENCOUNTER — Other Ambulatory Visit (HOSPITAL_COMMUNITY): Payer: Self-pay | Admitting: Cardiology

## 2023-03-11 NOTE — Progress Notes (Signed)
Remote pacemaker transmission.   

## 2023-03-12 DIAGNOSIS — M4802 Spinal stenosis, cervical region: Secondary | ICD-10-CM | POA: Diagnosis not present

## 2023-03-12 DIAGNOSIS — Z6823 Body mass index (BMI) 23.0-23.9, adult: Secondary | ICD-10-CM | POA: Diagnosis not present

## 2023-03-12 DIAGNOSIS — M329 Systemic lupus erythematosus, unspecified: Secondary | ICD-10-CM | POA: Diagnosis not present

## 2023-03-12 DIAGNOSIS — I7300 Raynaud's syndrome without gangrene: Secondary | ICD-10-CM | POA: Diagnosis not present

## 2023-03-12 DIAGNOSIS — K760 Fatty (change of) liver, not elsewhere classified: Secondary | ICD-10-CM | POA: Diagnosis not present

## 2023-03-12 DIAGNOSIS — Z79899 Other long term (current) drug therapy: Secondary | ICD-10-CM | POA: Diagnosis not present

## 2023-03-26 DIAGNOSIS — G4733 Obstructive sleep apnea (adult) (pediatric): Secondary | ICD-10-CM | POA: Diagnosis not present

## 2023-04-02 ENCOUNTER — Telehealth: Payer: Self-pay

## 2023-04-02 NOTE — Telephone Encounter (Signed)
[name], MD  You; Wiliam Ke, RN; Marinus Maw, MD; [facility] Device9 minutes ago (12:33 PM)    Thanks Abran Richard or Victorino Dike can you call her and see if she'll come see me tomorrow at 11:40 as an overbook? Then we can potentially get her on for cardioversion on Tuesday dec 17 with me if she remains in afib. GA   Spoke with pt regarding office visit for tomorrow at 11:40a. Looks like a 10:40am slot is available. Pt able to come at this time. She verbalizes understanding.

## 2023-04-02 NOTE — Telephone Encounter (Signed)
Call back received from Pt.  Pt has not been aware that she is out of rhythm.  Her only complaint is that she has had trouble sleeping.  She states on March 29, 2023 she had an episode where she felt like her legs were going to give out on her.  Unsure if this is related.  Pt currently taking Eliquis 5 mg PO BID and Toprol XL 25 MG po daily.    Pt has history of taking amiodarone, but this was stopped December 2023 d/t elevated liver enzymes.    Will forward to Dr. Ladona Ridgel and Dr. Jacques Navy for further advisement.

## 2023-04-02 NOTE — Telephone Encounter (Signed)
Outreach made to Pt.  Left message requesting call back.  Alert received from [employer]:

## 2023-04-03 ENCOUNTER — Ambulatory Visit: Payer: PPO | Attending: Internal Medicine | Admitting: Internal Medicine

## 2023-04-03 VITALS — BP 92/55 | HR 122 | Ht 62.0 in | Wt 132.6 lb

## 2023-04-03 DIAGNOSIS — I484 Atypical atrial flutter: Secondary | ICD-10-CM

## 2023-04-03 DIAGNOSIS — E222 Syndrome of inappropriate secretion of antidiuretic hormone: Secondary | ICD-10-CM | POA: Diagnosis not present

## 2023-04-03 DIAGNOSIS — I480 Paroxysmal atrial fibrillation: Secondary | ICD-10-CM | POA: Diagnosis not present

## 2023-04-03 DIAGNOSIS — D6869 Other thrombophilia: Secondary | ICD-10-CM | POA: Diagnosis not present

## 2023-04-03 DIAGNOSIS — I10 Essential (primary) hypertension: Secondary | ICD-10-CM

## 2023-04-03 DIAGNOSIS — Z950 Presence of cardiac pacemaker: Secondary | ICD-10-CM | POA: Diagnosis not present

## 2023-04-03 DIAGNOSIS — I495 Sick sinus syndrome: Secondary | ICD-10-CM

## 2023-04-03 DIAGNOSIS — I427 Cardiomyopathy due to drug and external agent: Secondary | ICD-10-CM

## 2023-04-03 DIAGNOSIS — I5032 Chronic diastolic (congestive) heart failure: Secondary | ICD-10-CM | POA: Diagnosis not present

## 2023-04-03 NOTE — Progress Notes (Addendum)
Cardiology Office Note:    Date:  04/03/2023  ID:  Sheila Morales, DOB 1950/10/01, MRN [id number]  PCP:  Myrlene Broker, MD  Cardiologist:  [name], MD  Electrophysiologist:  Lewayne Bunting, MD   Referring MD: Myrlene Broker, *   Chief Complaint/Reason for Referral: Bradycardia, dizziness s/p PPM  History of Present Illness:    Sheila Morales is a 72 y.o. female with a history of COPD, SLE previously on plaquenil but stopped due to concerns of plaquenil cardiomyopathy, BPPV, and HTN who presents for follow up. Implantation of a Biotronik dual-chamber pacemaker for symptomatic bradycardia due to sinus node dysfunction. Her husband [name] joins her for the visit today.  Discussed the use of AI scribe software for clinical note transcription with the patient, who gave verbal consent to proceed.  History of Present Illness   The patient, with a history of atrial fibrillation (AFib) and a pacemaker, presents with a current episode of AFib, as indicated by her pacemaker device. The patient's heart rate is high, running about 122, and her blood pressure is low. The patient also reports episodes of leg weakness, where she feels like her legs are giving out on her. These episodes are infrequent but have occurred recently. The patient denies feeling lightheaded or dizzy during these episodes. The patient also reports insomnia and fatigue, stating she gets tired easily and often falls asleep if she sits down for too long. The patient has been using a CPAP mask for sleep but reports no significant improvement in sleep quality. The patient also has dry eyes, which have improved with the use of night eye drops.       12/16/2022: Patient feels well overall and has continued to improve on heart failure therapy.  She is able to go to the gym and participate in strength training and is also walking 3 times a day up and down her driveway and feels well doing so.  She does have to stop at the  end of 3 laps to catch her breath, this is a vast improvement from prior visits.  Her husband who joins her for the visit as always notes that approximately every 8 to 10 weeks she will require 3 days of increased dose of Lasix, followed by a COPD regimen.  This usually resolves her symptoms without event.  Her weights are being taken daily and these remain overall stable.  She did have cervical neck surgery in May and is recovering well from that and has been released from surgical follow-up. Tolerating heart failure therapy well.  06/17/22:  Off plaquenil due to concern for Plaquenil CM based on cardiac MRI. Followed by AHF Dr. [name] as well. Did have a hospitalization in 01/2022 with decompensated HF, requiring IV diuresis. Meds include lasix, jardience. Also on Toprol XL. [name] discussed but has not been added yet. 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 Also chronic eosinophilic PNA? Responsive to prednisone. Also with moderate-severe COPD, on Anoro. Feeling ok lately. 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 Remains limited by significant day time fatigue, sleepiness and physical weakness. We reviewed that we have not yet accomplished our sleep study, I will re-refer to PCCM for this. Dr. [name] also pursuing. I strongly suspect OSA.  Known cervical neck disease, may be contributing to left arm weakness. She notes that if she is on the floor she cannot independently get up. She is working with EP.  Followed by EP, had abnormal LFTs,  amiodarone has been on hold, given for PAF. We reviewed with pharmD that jardience not likely the culprit in elevated LFTs.    Prior visits: 08/08/21: Over the last several months she has had a complex history involving 2 hospitalizations.  She was initially hospitalized November 30 through December 11 with fatigue and confusion and profound hyponatremia to 113.  Noted to have E. coli bacteremia with UTI and right-sided pyelonephritis by CT imaging.   She had new onset rapid atrial fibrillation during that hospital stay and acute congestive heart failure (HFpEF), heart failure necessitating BiPAP in the intensive care unit.  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  She returned to the hospital on April 22, 2021 with fever and confusion as well as lethargy, myalgias and fatigue.  She was also noted to have intermittent fevers.  Admitted for acute metabolic encephalopathy, and also had stool positive for occult blood.  Pulmonology recommended long-term prednisone taper and PJP prophylaxis.  Patient continues on methotrexate and hydroxychloroquine for lupus.  Eliquis had been initiated during her prior hospital stay for atrial fibrillation, and at her subsequent hospitalization she was noted to have acute blood loss anemia with a hemoglobin of 7.3.  Felt to have slow gastrointestinal bleed from Eliquis.  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  She had a peripheral eosinophilia.  Symptoms respond strongly to prednisone.  Bronchoscopy considered but deferred due to tenuous clinical status in the hospital.  Consideration also for methotrexate lung toxicity as well as lupus related ILD.  She presented to the ED 07/29/2021 after a mechanical fall thought to have occurred because of ongoing nonfocal weakness and pivoting too quickly. She had fallen the previous day as well. She was noted to have two transverse process fractures as well as nondisplaced fracture of her left 11th rib. Medicated with percocet and discharged with prescription for pain medication.  Her husband provides the majority of the history today. Today, she continues to suffer from significant pain due to her rib fracture. She has tried to  substitute tylenol instead of percocet, but they note tylenol is not strong enough to manage her pain. I recommended she contact her PCP for assistance with finding a medicinal alternative. She has been off of prednisone for about 3 weeks. Of note, they report she had facial edema while she was on prednisone. This has improved today. UA negative for protein recently, less likely nephritic swelling.  Generally when she is retaining fluid, she has edema in her legs. She states that her legs are constantly a little bit swollen. The edema seems to stay localized around her ankles, and has not progressed proximally. About three weeks ago she was started on a Lasix 40 mg daily and started on potassium. Since then, her husband has noticed she is more unstable, has more confusion, and is severely fatigued and short of breath with minimal exertion such as walking to this office from the parking lot. It is difficult for her to determine if she is feeling better or worse with the higher dose of Lasix. Her urine production is only somewhat increased. Today she believes she is okay regarding fluid retention, she did take her morning dose of Lasix so far. Typically she drinks around 50 oz of water a day. Intended to follow  a fluid restriction due to SIADH and hyponatremia.  Lately they have noticed some weight gain. This has been gradual in the past 7-8 weeks. They attribute her increased weight due to her recent health issues preventing her from formal exercise. She is limited to sitting due to her pain. Previously she was able to walk at 1.7 mph for 5-10 min on a treadmill, but is not able to do so at this time.  Her husband notes in the past week she has been in Atrial fibrillation. This was the first episode in a while.  Additionally she complains of sudden, intermittent episodes of body tremors that mostly affect her extremities.  Denies palpitations, PND, orthopnea.  Denies syncope or presyncope.   They plan to  take a vacation to [place] in June "come hell or high water".  08/14/21:  Appears brighter and feeling slightly better particularly since decreasing lasix and rib fractures are healing. LE edema is stable with decreased dose of lasix. We discontinued potassium at last office visit and K is stable on labs today. Sodium improved, Dr. [name] told them this AM to discontinue salt tabs. Cr improved since last visit with decrease in lasix. Reviewed future testing again including CMR and PET-CT perfusion upcoming.  09/27/21:  Husband feels she has improved in the last 2 weeks.  Her weight is steadily at 126 to 128 pounds.  The compression socks worked well and currently she has no lower extremity swelling.  She has continued on Lasix 20 mg daily.  We reviewed the results of her PET/CT myocardial perfusion study which was normal. Cardiac MRI showed: "Midmyocardial circumferential stripe of delayed enhancement at the base. Midmyocardial delayed enhancement in the lateral wall at the mid ventricle. Findings are nonspecific. Overall, findings may represent hypertensive heart disease. However, patient is chronically on plaquenil and this constellation of findings in combination with clinical presentation does not exclude plaquenil cardiomyopathy." We discussed that I will mention these findings to her rheumatologist and discuss if stopping plaquenil would be an option.  Discussed starting jardience for HFpEF symptoms.   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     Past Surgical History:  Procedure Laterality Date   ANTERIOR CERVICAL DECOMP/DISCECTOMY FUSION N/A 09/18/2022   Procedure: Anterior Cervical Discectomy Fusion Cervical Four-Cervical Five, Cervical Five-Cervical Six;  Surgeon: Donalee Citrin, MD;  Location: [facility] OR;  Service: Neurosurgery;  Laterality: N/A;   CHOLECYSTECTOMY     CHOLECYSTECTOMY, LAPAROSCOPIC     COLONOSCOPY     ESOPHAGOGASTRODUODENOSCOPY     lesion, vulva excision     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   Procedure: PACEMAKER IMPLANT;  Surgeon: Marinus Maw, MD;  Location: [facility] INVASIVE CV LAB;  Service: Cardiovascular;  Laterality: N/A;   TONSILLECTOMY     VIDEO BRONCHOSCOPY Bilateral 11/06/2017   Procedure:  VIDEO BRONCHOSCOPY WITHOUT FLUORO;  Surgeon: Chilton Greathouse, MD;  Location: [facility] ENDOSCOPY;  Service: Cardiopulmonary;  Laterality: Bilateral;    Current Medications: Current Meds  Medication Sig   acetaminophen (TYLENOL) 325 MG tablet Take 650 mg by mouth every 6 (six) hours as needed for moderate pain or mild pain.   albuterol (VENTOLIN HFA) 108 (90 Base) MCG/ACT inhaler Inhale 1-2 puffs into the lungs every 6 (six) hours as needed for wheezing or shortness of breath.   apixaban (ELIQUIS) 5 MG TABS tablet Take 1 tablet (5 mg total) by mouth 2 (two) times daily.   benzonatate (TESSALON) 200 MG capsule TAKE 1 CAPSULE (200 MG TOTAL) BY MOUTH EVERY 6 (SIX) HOURS AS NEEDED FOR COUGH.   calcium carbonate (OS-CAL - DOSED IN MG OF ELEMENTAL CALCIUM) 1250 (500 Ca) MG tablet Take 1 tablet by mouth every evening.   Cholecalciferol (VITAMIN D) 50 MCG (2000 UT) tablet Take 6,000 Units by mouth every evening.   empagliflozin (JARDIANCE) 10 MG TABS tablet Take 1 tablet (10 mg total) by mouth daily before breakfast.   fluticasone (FLONASE) 50 MCG/ACT nasal spray SPRAY 2 SPRAYS INTO EACH NOSTRIL EVERY DAY (Patient  taking differently: Place 2 sprays into both nostrils daily as needed for allergies.)   furosemide (LASIX) 20 MG tablet TAKE 1 TABLET (20 MG) BY MOUTH DAILY, TAKE 1 EXTRA TABLET BY MOUTH (20 MG) AS NEEDED FOR INCREASED SWELLING.   gabapentin (NEURONTIN) 300 MG capsule TAKE TWO CAPSULES BY MOUTH EVERY MORNING, TWO CAPSULES EACH AFTERNOON AND THREE CAPSULES AT BEDTIME (Patient taking differently: TAKE TWO CAPSULES BY MOUTH EVERY MORNING AND THREE CAPSULES AT BEDTIME)   metoprolol succinate (TOPROL-XL) 25 MG 24 hr tablet TAKE 1 TABLET (25 MG TOTAL) BY MOUTH DAILY.   Misc Natural Products (ADVANCED JOINT RELIEF) CAPS Take 1 capsule by mouth daily. Joint Advantage Gold 5x   Multiple Vitamin (MULTIVITAMIN WITH MINERALS) TABS tablet Take 1 tablet by mouth daily.   predniSONE (DELTASONE) 20 MG tablet AS NEEDED IF BRONCHITIS RECURS. TAKE 40 MG ONCE DAILY FOR 5 DAYS.   RESTASIS 0.05 % ophthalmic emulsion Place 1 drop into both eyes 2 (two) times daily.   triamcinolone cream (KENALOG) 0.1 % Apply 1 Application topically daily as needed (irritation).   umeclidinium-vilanterol (ANORO ELLIPTA) 62.5-25 MCG/ACT AEPB Inhale 1 puff into the lungs daily.   vitamin C (ASCORBIC ACID) 500 MG tablet Take 500 mg by mouth daily.   vitamin E 180 MG (400 UNITS) capsule Take 400 Units by mouth daily.   zolpidem (AMBIEN) 5 MG tablet TAKE 1 TABLET BY MOUTH EVERY DAY AT BEDTIME AS NEEDED FOR SLEEP     Allergies:   Patient has no known allergies.   Social History   Tobacco Use   Smoking status: Former    Current packs/day: 0.00    Average packs/day: 0.3 packs/day for 30.0 years (7.5 ttl pk-yrs)    Types: Cigarettes    Start date: 01/20/1990    Quit date: 01/21/2020    Years since quitting: 3.2   Smokeless tobacco: Never   Tobacco comments:    smokes "every now and then" 08/24/20 [facility]   Vaping Use   Vaping status: Never Used  Substance Use Topics   Alcohol use: Not Currently    Alcohol/week: 5.0 standard drinks of  alcohol    Types: 5 Standard drinks or equivalent per week    Comment: 4 out of 7 dyas per husband at bedside   Drug use: No  Family History: The patient's family history includes Atrial fibrillation in her father; Colon cancer in her father; Dementia in her sister; Healthy in her daughter; Other in her sister; Stroke in her mother.  ROS:   Please see the history of present illness.     All other systems reviewed and are negative.  EKGs/Labs/Other Studies Reviewed:    The following studies were reviewed today:   EKG:  EKG Interpretation Date/Time:  Thursday April 03 2023 11:02:45 EST Ventricular Rate:  122 PR Interval:    QRS Duration:  76 QT Interval:  294 QTC Calculation: 418 R Axis:   38  Text Interpretation: Atrial flutter with rapid ventricular response and variable conduction with ventricular-paced complexes ST & T wave abnormality, consider inferolateral ischemia When compared with ECG of 16-Dec-2022 14:26, Electronic ventricular pacemaker has replaced Electronic atrial pacemaker Vent. rate has increased BY  56 BPM Confirmed by Weston Brass ([id number]) on 04/03/2023 8:39:13 PM     Recent Labs: 02/27/2023: ALT 43; BUN 21; Creatinine, Ser 1.26; Hemoglobin 14.3; Platelets 204.0; Potassium 4.3; Sodium 133; TSH 1.00   Recent Lipid Panel    Component Value Date/Time   CHOL 172 02/27/2023 1102   TRIG 253.0 (H) 02/27/2023 1102   HDL 55.80 02/27/2023 1102   CHOLHDL 3 02/27/2023 1102   VLDL 50.6 (H) 02/27/2023 1102   LDLCALC 65 02/27/2023 1102   LDLDIRECT 52.0 02/24/2017 1351    Physical Exam:    VS:  BP (!) 92/55 (BP Location: Left Arm, Patient Position: Sitting, Cuff Size: Normal)   Pulse (!) 122   Ht 5\' 2"  (1.575 m)   Wt 132 lb 9.6 oz (60.1 kg)   SpO2 96%   BMI 24.25 kg/m     Wt Readings from Last 5 Encounters:  04/03/23 132 lb 9.6 oz (60.1 kg)  02/27/23 128 lb 6.4 oz (58.2 kg)  02/12/23 128 lb 12.8 oz (58.4 kg)  12/16/22 134 lb (60.8 kg)  11/11/22  129 lb (58.5 kg)    Constitutional: No acute distress Eyes: sclera non-icteric, normal conjunctiva and lids ENMT: normal dentition, moist mucous membranes Cardiovascular: irregular and tachycardic,  no murmurs. S1 and S2 normal. No jugular venous distention.   Respiratory: faint rhonchi bilaterally GI : normal bowel sounds, soft and nontender. No distention.   MSK: extremities warm, well perfused.  Trace ankle edema.  NEURO: grossly nonfocal exam, moves all extremities. PSYCH: alert and oriented x 3, normal mood and affect.   ASSESSMENT:    1. AF (paroxysmal atrial fibrillation) (HCC)   2. Atypical atrial flutter (HCC)   3. Chronic diastolic CHF (congestive heart failure) (HCC)   4. Secondary hypercoagulable state (HCC)   5. Sinus node dysfunction (HCC)   6. Essential hypertension   7. Pacemaker   8. SIADH (syndrome of inappropriate ADH production) (HCC)   9. 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 Considering cardioversion due to persistent high heart rate and potential contribution to low blood pressure and associated symptoms. -Plan for TEE cardioversion on Tuesday, pending lab results. -Continue Eliquis and Metoprolol. -amiodarone stopped due to elevated LFTs. Remains on metoprolol succinate   Hypotension Possible association with high heart rate and AFib. Patient reports episodes of leg weakness and near falls. -Monitor blood pressure closely.      Dyspnea on exertion Chronic HFpEF - multifactorial dyspnea, follows  with pulmonary. Class I-II today. - continue lasix, 20 mg daily and an additional dose as needed, this only occurs every 8 to 10 weeks.  She is doing very well. - PET-CT normal - cardiac MRI was negative for amyloid but may indicate plaquenil CM vs HTN heart disease.  - jardience 10 mg daily for HFpEF -Edema has  resolved.  Weakness Fatigue Imbalance -The symptoms have improved with exercise and strength training.    Bradycardia Dizziness Sinus node dysfunction (HCC) Pacemaker -Device interrogations have been unremarkable for cause for the above-mentioned symptoms.  Symptoms have overall improved.  Hyponatremia - felt to be SIADH.  Management per PCP. Patient is now using Celtic salt which she feels is easier to incorporate in her diet in moderation.  Hypertension-blood pressure mildly low today.     Medication Adjustments/Labs and Tests Ordered: Current medicines are reviewed at length with the patient today.  Concerns regarding medicines are outlined above.   Orders Placed This Encounter  Procedures   Basic Metabolic Panel (BMET)   CBC   EKG 12-Lead    No orders of the defined types were placed in this encounter.   Patient Instructions  Medication Instructions:  Your physician recommends that you continue on your current medications as directed. Please refer to the Current Medication list given to you today.  *If you need a refill on your cardiac medications before your next appointment, please call your pharmacy*  Lab Work: CBC and BMET today If you have labs (blood work) drawn today and your tests are completely normal, you will receive your results only by: MyChart Message (if you have MyChart) OR A paper copy in the mail If you have any lab test that is abnormal or we need to change your treatment, we will call you to review the results.  Testing/Procedures: Your physician has requested that you have a TEE/Cardioversion. During a TEE, sound waves are used to create images of your heart. It provides your doctor with information about the size and shape of your heart and how well your heart's chambers and valves are working. In this test, a transducer is attached to the end of a flexible tube that is guided down you throat and into your esophagus (the tube leading from your  mouth to your stomach) to get a more detailed image of your heart. Once the TEE has determined that a blood clot is not present, the cardioversion begins. Electrical Cardioversion uses a jolt of electricity to your heart either through paddles or wired patches attached to your chest. This is a controlled, usually prescheduled, procedure. This procedure is done at the hospital and you are not awake during the procedure. You usually go home the day of the procedure. Please see the instruction sheet given to you today for more information.        Dear [name]  You are scheduled for a TEE (Transesophageal Echocardiogram) Guided Cardioversion on Tuesday, December 17 with Dr. Weston Brass.  Please arrive at the [facility] (Main Entrance A) at [facility]: [address] [place], [place] [address] at 1:30 PM (This time is 1 hour(s) before your procedure to ensure your preparation).   Free valet parking service is available. You will check in at ADMITTING.   *Please Note: You will receive a call the day before your procedure to confirm the appointment time. That time may have changed from the original time based on the schedule for that day.*    DIET:  Nothing to eat or drink after midnight except a sip of water with medications (see medication instructions below)  MEDICATION INSTRUCTIONS: !!IF ANY NEW MEDICATIONS ARE STARTED AFTER TODAY, PLEASE NOTIFY YOUR PROVIDER AS SOON AS POSSIBLE!!  FYI: Medications such as Semaglutide (Ozempic, [place]), Tirzepatide (Mounjaro, Zepbound), Dulaglutide (Trulicity), etc ("GLP1 agonists") AND Canagliflozin (Invokana), Dapagliflozin (Farxiga), Empagliflozin (Jardiance), Ertugliflozin (Steglatro), Bexagliflozin [employer]) or any combination with one of these drugs such as Invokamet (Canagliflozin/Metformin), Synjardy (Empagliflozin/Metformin), etc ("SGLT2 inhibitors") must be held around the time of a procedure. This is not a comprehensive list of all of  these drugs. Please review all of your medications and talk to your provider if you take any one of these. If you are not sure, ask your provider.   HOLD: Empagliflozin (Jardiance) for 3 days prior to the procedure. Last dose on Friday, December 13.  Continue taking your anticoagulant (blood thinner): Apixaban (Eliquis).  You will need to continue this after your procedure until you are told by your provider that it is safe to stop.    Hold: Furosemide (Lasix) day of procedure (on 04/08/2023)  LABS: Bmet and CBC today  FYI:  For your safety, and to allow [place] to monitor your vital signs accurately during the surgery/procedure we request: If you have artificial nails, gel coating, SNS etc, please have those removed prior to your surgery/procedure. Not having the nail coverings /polish removed may result in cancellation or delay of your surgery/procedure.  Your support person will be asked to wait in the waiting room during your procedure.  It is OK to have someone drop you off and come back when you are ready to be discharged.  You cannot drive after the procedure and will need someone to drive you home.  Bring your insurance cards.  *Special Note: Every effort is made to have your procedure done on time. Occasionally there are emergencies that occur at the hospital that may cause delays. Please be patient if a delay does occur.      Follow-Up: At [facility], you and your health needs are our priority.  As part of our continuing mission to provide you with exceptional heart care, we have created designated Provider Care Teams.  These Care Teams include your primary Cardiologist (physician) and Advanced Practice Providers (APPs -  Physician Assistants and Nurse Practitioners) who all work together to provide you with the care you need, when you need it.   Your next appointment:    1-2 months  Provider:   [name], MD  or APP (Advanced Practice Provider/Nurse Practitioner  or Physician's Assistant)

## 2023-04-03 NOTE — H&P (View-Only) (Signed)
 Cardiology Office Note:    Date:  04/03/2023  ID:  Sheila Morales, DOB 1950/10/01, MRN [id number]  PCP:  Sheila Broker, MD  Cardiologist:  [name], MD  Electrophysiologist:  Lewayne Bunting, MD   Referring MD: Sheila Morales, *   Chief Complaint/Reason for Referral: Bradycardia, dizziness s/p PPM  History of Present Illness:    Sheila Morales is a 72 y.o. female with a history of COPD, SLE previously on plaquenil but stopped due to concerns of plaquenil cardiomyopathy, BPPV, and HTN who presents for follow up. Implantation of a Biotronik dual-chamber pacemaker for symptomatic bradycardia due to sinus node dysfunction. Her husband [name] joins her for the visit today.  Discussed the use of AI scribe software for clinical note transcription with the patient, who gave verbal consent to proceed.  History of Present Illness   The patient, with a history of atrial fibrillation (AFib) and a pacemaker, presents with a current episode of AFib, as indicated by her pacemaker device. The patient's heart rate is high, running about 122, and her blood pressure is low. The patient also reports episodes of leg weakness, where she feels like her legs are giving out on her. These episodes are infrequent but have occurred recently. The patient denies feeling lightheaded or dizzy during these episodes. The patient also reports insomnia and fatigue, stating she gets tired easily and often falls asleep if she sits down for too long. The patient has been using a CPAP mask for sleep but reports no significant improvement in sleep quality. The patient also has dry eyes, which have improved with the use of night eye drops.       12/16/2022: Patient feels well overall and has continued to improve on heart failure therapy.  She is able to go to the gym and participate in strength training and is also walking 3 times a day up and down her driveway and feels well doing so.  She does have to stop at the  end of 3 laps to catch her breath, this is a vast improvement from prior visits.  Her husband who joins her for the visit as always notes that approximately every 8 to 10 weeks she will require 3 days of increased dose of Lasix, followed by a COPD regimen.  This usually resolves her symptoms without event.  Her weights are being taken daily and these remain overall stable.  She did have cervical neck surgery in May and is recovering well from that and has been released from surgical follow-up. Tolerating heart failure therapy well.  06/17/22:  Off plaquenil due to concern for Plaquenil CM based on cardiac MRI. Followed by AHF Dr. [name] as well. Did have a hospitalization in 01/2022 with decompensated HF, requiring IV diuresis. Meds include lasix, jardience. Also on Toprol XL. [name] discussed but has not been added yet. 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 Also chronic eosinophilic PNA? Responsive to prednisone. Also with moderate-severe COPD, on Anoro. Feeling ok lately. 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 Remains limited by significant day time fatigue, sleepiness and physical weakness. We reviewed that we have not yet accomplished our sleep study, I will re-refer to PCCM for this. Dr. [name] also pursuing. I strongly suspect OSA.  Known cervical neck disease, may be contributing to left arm weakness. She notes that if she is on the floor she cannot independently get up. She is working with EP.  Followed by EP, had abnormal LFTs,  amiodarone has been on hold, given for PAF. We reviewed with pharmD that jardience not likely the culprit in elevated LFTs.    Prior visits: 08/08/21: Over the last several months she has had a complex history involving 2 hospitalizations.  She was initially hospitalized November 30 through December 11 with fatigue and confusion and profound hyponatremia to 113.  Noted to have E. coli bacteremia with UTI and right-sided pyelonephritis by CT imaging.   She had new onset rapid atrial fibrillation during that hospital stay and acute congestive heart failure (HFpEF), heart failure necessitating BiPAP in the intensive care unit.  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  She returned to the hospital on April 22, 2021 with fever and confusion as well as lethargy, myalgias and fatigue.  She was also noted to have intermittent fevers.  Admitted for acute metabolic encephalopathy, and also had stool positive for occult blood.  Pulmonology recommended long-term prednisone taper and PJP prophylaxis.  Patient continues on methotrexate and hydroxychloroquine for lupus.  Eliquis had been initiated during her prior hospital stay for atrial fibrillation, and at her subsequent hospitalization she was noted to have acute blood loss anemia with a hemoglobin of 7.3.  Felt to have slow gastrointestinal bleed from Eliquis.  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  She had a peripheral eosinophilia.  Symptoms respond strongly to prednisone.  Bronchoscopy considered but deferred due to tenuous clinical status in the hospital.  Consideration also for methotrexate lung toxicity as well as lupus related ILD.  She presented to the ED 07/29/2021 after a mechanical fall thought to have occurred because of ongoing nonfocal weakness and pivoting too quickly. She had fallen the previous day as well. She was noted to have two transverse process fractures as well as nondisplaced fracture of her left 11th rib. Medicated with percocet and discharged with prescription for pain medication.  Her husband provides the majority of the history today. Today, she continues to suffer from significant pain due to her rib fracture. She has tried to  substitute tylenol instead of percocet, but they note tylenol is not strong enough to manage her pain. I recommended she contact her PCP for assistance with finding a medicinal alternative. She has been off of prednisone for about 3 weeks. Of note, they report she had facial edema while she was on prednisone. This has improved today. UA negative for protein recently, less likely nephritic swelling.  Generally when she is retaining fluid, she has edema in her legs. She states that her legs are constantly a little bit swollen. The edema seems to stay localized around her ankles, and has not progressed proximally. About three weeks ago she was started on a Lasix 40 mg daily and started on potassium. Since then, her husband has noticed she is more unstable, has more confusion, and is severely fatigued and short of breath with minimal exertion such as walking to this office from the parking lot. It is difficult for her to determine if she is feeling better or worse with the higher dose of Lasix. Her urine production is only somewhat increased. Today she believes she is okay regarding fluid retention, she did take her morning dose of Lasix so far. Typically she drinks around 50 oz of water a day. Intended to follow  a fluid restriction due to SIADH and hyponatremia.  Lately they have noticed some weight gain. This has been gradual in the past 7-8 weeks. They attribute her increased weight due to her recent health issues preventing her from formal exercise. She is limited to sitting due to her pain. Previously she was able to walk at 1.7 mph for 5-10 min on a treadmill, but is not able to do so at this time.  Her husband notes in the past week she has been in Atrial fibrillation. This was the first episode in a while.  Additionally she complains of sudden, intermittent episodes of body tremors that mostly affect her extremities.  Denies palpitations, PND, orthopnea.  Denies syncope or presyncope.   They plan to  take a vacation to [place] in June "come hell or high water".  08/14/21:  Appears brighter and feeling slightly better particularly since decreasing lasix and rib fractures are healing. LE edema is stable with decreased dose of lasix. We discontinued potassium at last office visit and K is stable on labs today. Sodium improved, Dr. [name] told them this AM to discontinue salt tabs. Cr improved since last visit with decrease in lasix. Reviewed future testing again including CMR and PET-CT perfusion upcoming.  09/27/21:  Husband feels she has improved in the last 2 weeks.  Her weight is steadily at 126 to 128 pounds.  The compression socks worked well and currently she has no lower extremity swelling.  She has continued on Lasix 20 mg daily.  We reviewed the results of her PET/CT myocardial perfusion study which was normal. Cardiac MRI showed: "Midmyocardial circumferential stripe of delayed enhancement at the base. Midmyocardial delayed enhancement in the lateral wall at the mid ventricle. Findings are nonspecific. Overall, findings may represent hypertensive heart disease. However, patient is chronically on plaquenil and this constellation of findings in combination with clinical presentation does not exclude plaquenil cardiomyopathy." We discussed that I will mention these findings to her rheumatologist and discuss if stopping plaquenil would be an option.  Discussed starting jardience for HFpEF symptoms.   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     Past Surgical History:  Procedure Laterality Date   ANTERIOR CERVICAL DECOMP/DISCECTOMY FUSION N/A 09/18/2022   Procedure: Anterior Cervical Discectomy Fusion Cervical Four-Cervical Five, Cervical Five-Cervical Six;  Surgeon: Donalee Citrin, MD;  Location: [facility] OR;  Service: Neurosurgery;  Laterality: N/A;   CHOLECYSTECTOMY     CHOLECYSTECTOMY, LAPAROSCOPIC     COLONOSCOPY     ESOPHAGOGASTRODUODENOSCOPY     lesion, vulva excision     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   Procedure: PACEMAKER IMPLANT;  Surgeon: Marinus Maw, MD;  Location: [facility] INVASIVE CV LAB;  Service: Cardiovascular;  Laterality: N/A;   TONSILLECTOMY     VIDEO BRONCHOSCOPY Bilateral 11/06/2017   Procedure:  VIDEO BRONCHOSCOPY WITHOUT FLUORO;  Surgeon: Chilton Greathouse, MD;  Location: [facility] ENDOSCOPY;  Service: Cardiopulmonary;  Laterality: Bilateral;    Current Medications: Current Meds  Medication Sig   acetaminophen (TYLENOL) 325 MG tablet Take 650 mg by mouth every 6 (six) hours as needed for moderate pain or mild pain.   albuterol (VENTOLIN HFA) 108 (90 Base) MCG/ACT inhaler Inhale 1-2 puffs into the lungs every 6 (six) hours as needed for wheezing or shortness of breath.   apixaban (ELIQUIS) 5 MG TABS tablet Take 1 tablet (5 mg total) by mouth 2 (two) times daily.   benzonatate (TESSALON) 200 MG capsule TAKE 1 CAPSULE (200 MG TOTAL) BY MOUTH EVERY 6 (SIX) HOURS AS NEEDED FOR COUGH.   calcium carbonate (OS-CAL - DOSED IN MG OF ELEMENTAL CALCIUM) 1250 (500 Ca) MG tablet Take 1 tablet by mouth every evening.   Cholecalciferol (VITAMIN D) 50 MCG (2000 UT) tablet Take 6,000 Units by mouth every evening.   empagliflozin (JARDIANCE) 10 MG TABS tablet Take 1 tablet (10 mg total) by mouth daily before breakfast.   fluticasone (FLONASE) 50 MCG/ACT nasal spray SPRAY 2 SPRAYS INTO EACH NOSTRIL EVERY DAY (Patient  taking differently: Place 2 sprays into both nostrils daily as needed for allergies.)   furosemide (LASIX) 20 MG tablet TAKE 1 TABLET (20 MG) BY MOUTH DAILY, TAKE 1 EXTRA TABLET BY MOUTH (20 MG) AS NEEDED FOR INCREASED SWELLING.   gabapentin (NEURONTIN) 300 MG capsule TAKE TWO CAPSULES BY MOUTH EVERY MORNING, TWO CAPSULES EACH AFTERNOON AND THREE CAPSULES AT BEDTIME (Patient taking differently: TAKE TWO CAPSULES BY MOUTH EVERY MORNING AND THREE CAPSULES AT BEDTIME)   metoprolol succinate (TOPROL-XL) 25 MG 24 hr tablet TAKE 1 TABLET (25 MG TOTAL) BY MOUTH DAILY.   Misc Natural Products (ADVANCED JOINT RELIEF) CAPS Take 1 capsule by mouth daily. Joint Advantage Gold 5x   Multiple Vitamin (MULTIVITAMIN WITH MINERALS) TABS tablet Take 1 tablet by mouth daily.   predniSONE (DELTASONE) 20 MG tablet AS NEEDED IF BRONCHITIS RECURS. TAKE 40 MG ONCE DAILY FOR 5 DAYS.   RESTASIS 0.05 % ophthalmic emulsion Place 1 drop into both eyes 2 (two) times daily.   triamcinolone cream (KENALOG) 0.1 % Apply 1 Application topically daily as needed (irritation).   umeclidinium-vilanterol (ANORO ELLIPTA) 62.5-25 MCG/ACT AEPB Inhale 1 puff into the lungs daily.   vitamin C (ASCORBIC ACID) 500 MG tablet Take 500 mg by mouth daily.   vitamin E 180 MG (400 UNITS) capsule Take 400 Units by mouth daily.   zolpidem (AMBIEN) 5 MG tablet TAKE 1 TABLET BY MOUTH EVERY DAY AT BEDTIME AS NEEDED FOR SLEEP     Allergies:   Patient has no known allergies.   Social History   Tobacco Use   Smoking status: Former    Current packs/day: 0.00    Average packs/day: 0.3 packs/day for 30.0 years (7.5 ttl pk-yrs)    Types: Cigarettes    Start date: 01/20/1990    Quit date: 01/21/2020    Years since quitting: 3.2   Smokeless tobacco: Never   Tobacco comments:    smokes "every now and then" 08/24/20 [facility]   Vaping Use   Vaping status: Never Used  Substance Use Topics   Alcohol use: Not Currently    Alcohol/week: 5.0 standard drinks of  alcohol    Types: 5 Standard drinks or equivalent per week    Comment: 4 out of 7 dyas per husband at bedside   Drug use: No  Family History: The patient's family history includes Atrial fibrillation in her father; Colon cancer in her father; Dementia in her sister; Healthy in her daughter; Other in her sister; Stroke in her mother.  ROS:   Please see the history of present illness.     All other systems reviewed and are negative.  EKGs/Labs/Other Studies Reviewed:    The following studies were reviewed today:   EKG:  EKG Interpretation Date/Time:  Thursday April 03 2023 11:02:45 EST Ventricular Rate:  122 PR Interval:    QRS Duration:  76 QT Interval:  294 QTC Calculation: 418 R Axis:   38  Text Interpretation: Atrial flutter with rapid ventricular response and variable conduction with ventricular-paced complexes ST & T wave abnormality, consider inferolateral ischemia When compared with ECG of 16-Dec-2022 14:26, Electronic ventricular pacemaker has replaced Electronic atrial pacemaker Vent. rate has increased BY  56 BPM Confirmed by Weston Brass ([id number]) on 04/03/2023 8:39:13 PM     Recent Labs: 02/27/2023: ALT 43; BUN 21; Creatinine, Ser 1.26; Hemoglobin 14.3; Platelets 204.0; Potassium 4.3; Sodium 133; TSH 1.00   Recent Lipid Panel    Component Value Date/Time   CHOL 172 02/27/2023 1102   TRIG 253.0 (H) 02/27/2023 1102   HDL 55.80 02/27/2023 1102   CHOLHDL 3 02/27/2023 1102   VLDL 50.6 (H) 02/27/2023 1102   LDLCALC 65 02/27/2023 1102   LDLDIRECT 52.0 02/24/2017 1351    Physical Exam:    VS:  BP (!) 92/55 (BP Location: Left Arm, Patient Position: Sitting, Cuff Size: Normal)   Pulse (!) 122   Ht 5\' 2"  (1.575 m)   Wt 132 lb 9.6 oz (60.1 kg)   SpO2 96%   BMI 24.25 kg/m     Wt Readings from Last 5 Encounters:  04/03/23 132 lb 9.6 oz (60.1 kg)  02/27/23 128 lb 6.4 oz (58.2 kg)  02/12/23 128 lb 12.8 oz (58.4 kg)  12/16/22 134 lb (60.8 kg)  11/11/22  129 lb (58.5 kg)    Constitutional: No acute distress Eyes: sclera non-icteric, normal conjunctiva and lids ENMT: normal dentition, moist mucous membranes Cardiovascular: irregular and tachycardic,  no murmurs. S1 and S2 normal. No jugular venous distention.   Respiratory: faint rhonchi bilaterally GI : normal bowel sounds, soft and nontender. No distention.   MSK: extremities warm, well perfused.  Trace ankle edema.  NEURO: grossly nonfocal exam, moves all extremities. PSYCH: alert and oriented x 3, normal mood and affect.   ASSESSMENT:    1. AF (paroxysmal atrial fibrillation) (HCC)   2. Atypical atrial flutter (HCC)   3. Chronic diastolic CHF (congestive heart failure) (HCC)   4. Secondary hypercoagulable state (HCC)   5. Sinus node dysfunction (HCC)   6. Essential hypertension   7. Pacemaker   8. SIADH (syndrome of inappropriate ADH production) (HCC)   9. 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 Considering cardioversion due to persistent high heart rate and potential contribution to low blood pressure and associated symptoms. -Plan for TEE cardioversion on Tuesday, pending lab results. -Continue Eliquis and Metoprolol. -amiodarone stopped due to elevated LFTs. Remains on metoprolol succinate   Hypotension Possible association with high heart rate and AFib. Patient reports episodes of leg weakness and near falls. -Monitor blood pressure closely.      Dyspnea on exertion Chronic HFpEF - multifactorial dyspnea, follows  with pulmonary. Class I-II today. - continue lasix, 20 mg daily and an additional dose as needed, this only occurs every 8 to 10 weeks.  She is doing very well. - PET-CT normal - cardiac MRI was negative for amyloid but may indicate plaquenil CM vs HTN heart disease.  - jardience 10 mg daily for HFpEF -Edema has  resolved.  Weakness Fatigue Imbalance -The symptoms have improved with exercise and strength training.    Bradycardia Dizziness Sinus node dysfunction (HCC) Pacemaker -Device interrogations have been unremarkable for cause for the above-mentioned symptoms.  Symptoms have overall improved.  Hyponatremia - felt to be SIADH.  Management per PCP. Patient is now using Celtic salt which she feels is easier to incorporate in her diet in moderation.  Hypertension-blood pressure mildly low today.     Medication Adjustments/Labs and Tests Ordered: Current medicines are reviewed at length with the patient today.  Concerns regarding medicines are outlined above.   Orders Placed This Encounter  Procedures   Basic Metabolic Panel (BMET)   CBC   EKG 12-Lead    No orders of the defined types were placed in this encounter.   Patient Instructions  Medication Instructions:  Your physician recommends that you continue on your current medications as directed. Please refer to the Current Medication list given to you today.  *If you need a refill on your cardiac medications before your next appointment, please call your pharmacy*  Lab Work: CBC and BMET today If you have labs (blood work) drawn today and your tests are completely normal, you will receive your results only by: MyChart Message (if you have MyChart) OR A paper copy in the mail If you have any lab test that is abnormal or we need to change your treatment, we will call you to review the results.  Testing/Procedures: Your physician has requested that you have a TEE/Cardioversion. During a TEE, sound waves are used to create images of your heart. It provides your doctor with information about the size and shape of your heart and how well your heart's chambers and valves are working. In this test, a transducer is attached to the end of a flexible tube that is guided down you throat and into your esophagus (the tube leading from your  mouth to your stomach) to get a more detailed image of your heart. Once the TEE has determined that a blood clot is not present, the cardioversion begins. Electrical Cardioversion uses a jolt of electricity to your heart either through paddles or wired patches attached to your chest. This is a controlled, usually prescheduled, procedure. This procedure is done at the hospital and you are not awake during the procedure. You usually go home the day of the procedure. Please see the instruction sheet given to you today for more information.        Dear [name]  You are scheduled for a TEE (Transesophageal Echocardiogram) Guided Cardioversion on Tuesday, December 17 with Dr. Weston Brass.  Please arrive at the [facility] (Main Entrance A) at [facility]: [address] [place], [place] [address] at 1:30 PM (This time is 1 hour(s) before your procedure to ensure your preparation).   Free valet parking service is available. You will check in at ADMITTING.   *Please Note: You will receive a call the day before your procedure to confirm the appointment time. That time may have changed from the original time based on the schedule for that day.*    DIET:  Nothing to eat or drink after midnight except a sip of water with medications (see medication instructions below)  MEDICATION INSTRUCTIONS: !!IF ANY NEW MEDICATIONS ARE STARTED AFTER TODAY, PLEASE NOTIFY YOUR PROVIDER AS SOON AS POSSIBLE!!  FYI: Medications such as Semaglutide (Ozempic, [place]), Tirzepatide (Mounjaro, Zepbound), Dulaglutide (Trulicity), etc ("GLP1 agonists") AND Canagliflozin (Invokana), Dapagliflozin (Farxiga), Empagliflozin (Jardiance), Ertugliflozin (Steglatro), Bexagliflozin [employer]) or any combination with one of these drugs such as Invokamet (Canagliflozin/Metformin), Synjardy (Empagliflozin/Metformin), etc ("SGLT2 inhibitors") must be held around the time of a procedure. This is not a comprehensive list of all of  these drugs. Please review all of your medications and talk to your provider if you take any one of these. If you are not sure, ask your provider.   HOLD: Empagliflozin (Jardiance) for 3 days prior to the procedure. Last dose on Friday, December 13.  Continue taking your anticoagulant (blood thinner): Apixaban (Eliquis).  You will need to continue this after your procedure until you are told by your provider that it is safe to stop.    Hold: Furosemide (Lasix) day of procedure (on 04/08/2023)  LABS: Bmet and CBC today  FYI:  For your safety, and to allow [place] to monitor your vital signs accurately during the surgery/procedure we request: If you have artificial nails, gel coating, SNS etc, please have those removed prior to your surgery/procedure. Not having the nail coverings /polish removed may result in cancellation or delay of your surgery/procedure.  Your support person will be asked to wait in the waiting room during your procedure.  It is OK to have someone drop you off and come back when you are ready to be discharged.  You cannot drive after the procedure and will need someone to drive you home.  Bring your insurance cards.  *Special Note: Every effort is made to have your procedure done on time. Occasionally there are emergencies that occur at the hospital that may cause delays. Please be patient if a delay does occur.      Follow-Up: At [facility], you and your health needs are our priority.  As part of our continuing mission to provide you with exceptional heart care, we have created designated Provider Care Teams.  These Care Teams include your primary Cardiologist (physician) and Advanced Practice Providers (APPs -  Physician Assistants and Nurse Practitioners) who all work together to provide you with the care you need, when you need it.   Your next appointment:    1-2 months  Provider:   [name], MD  or APP (Advanced Practice Provider/Nurse Practitioner  or Physician's Assistant)

## 2023-04-03 NOTE — Patient Instructions (Addendum)
Medication Instructions:  Your physician recommends that you continue on your current medications as directed. Please refer to the Current Medication list given to you today.  *If you need a refill on your cardiac medications before your next appointment, please call your pharmacy*  Lab Work: CBC and BMET today If you have labs (blood work) drawn today and your tests are completely normal, you will receive your results only by: MyChart Message (if you have MyChart) OR A paper copy in the mail If you have any lab test that is abnormal or we need to change your treatment, we will call you to review the results.  Testing/Procedures: Your physician has requested that you have a TEE/Cardioversion. During a TEE, sound waves are used to create images of your heart. It provides your doctor with information about the size and shape of your heart and how well your heart's chambers and valves are working. In this test, a transducer is attached to the end of a flexible tube that is guided down you throat and into your esophagus (the tube leading from your mouth to your stomach) to get a more detailed image of your heart. Once the TEE has determined that a blood clot is not present, the cardioversion begins. Electrical Cardioversion uses a jolt of electricity to your heart either through paddles or wired patches attached to your chest. This is a controlled, usually prescheduled, procedure. This procedure is done at the hospital and you are not awake during the procedure. You usually go home the day of the procedure. Please see the instruction sheet given to you today for more information.        Dear Sheila Morales  You are scheduled for a TEE (Transesophageal Echocardiogram) Guided Cardioversion on Tuesday, December 17 with Dr. Weston Brass.  Please arrive at the [facility] (Main Entrance A) at [facility]: [address] [place], [place] [address] at 1:30 PM (This time is 1 hour(s) before your  procedure to ensure your preparation).   Free valet parking service is available. You will check in at ADMITTING.   *Please Note: You will receive a call the day before your procedure to confirm the appointment time. That time may have changed from the original time based on the schedule for that day.*    DIET:  Nothing to eat or drink after midnight except a sip of water with medications (see medication instructions below)  MEDICATION INSTRUCTIONS: !!IF ANY NEW MEDICATIONS ARE STARTED AFTER TODAY, PLEASE NOTIFY YOUR PROVIDER AS SOON AS POSSIBLE!!  FYI: Medications such as Semaglutide (Ozempic, [place]), Tirzepatide (Mounjaro, Zepbound), Dulaglutide (Trulicity), etc ("GLP1 agonists") AND Canagliflozin (Invokana), Dapagliflozin (Farxiga), Empagliflozin (Jardiance), Ertugliflozin (Steglatro), Bexagliflozin [employer]) or any combination with one of these drugs such as Invokamet (Canagliflozin/Metformin), Synjardy (Empagliflozin/Metformin), etc ("SGLT2 inhibitors") must be held around the time of a procedure. This is not a comprehensive list of all of these drugs. Please review all of your medications and talk to your provider if you take any one of these. If you are not sure, ask your provider.   HOLD: Empagliflozin (Jardiance) for 3 days prior to the procedure. Last dose on Friday, December 13.  Continue taking your anticoagulant (blood thinner): Apixaban (Eliquis).  You will need to continue this after your procedure until you are told by your provider that it is safe to stop.    Hold: Furosemide (Lasix) day of procedure (on 04/08/2023)  LABS: Bmet and CBC today  FYI:  For your safety, and to  allow [place] to monitor your vital signs accurately during the surgery/procedure we request: If you have artificial nails, gel coating, SNS etc, please have those removed prior to your surgery/procedure. Not having the nail coverings /polish removed may result in cancellation or delay of your  surgery/procedure.  Your support person will be asked to wait in the waiting room during your procedure.  It is OK to have someone drop you off and come back when you are ready to be discharged.  You cannot drive after the procedure and will need someone to drive you home.  Bring your insurance cards.  *Special Note: Every effort is made to have your procedure done on time. Occasionally there are emergencies that occur at the hospital that may cause delays. Please be patient if a delay does occur.      Follow-Up: At [facility], you and your health needs are our priority.  As part of our continuing mission to provide you with exceptional heart care, we have created designated Provider Care Teams.  These Care Teams include your primary Cardiologist (physician) and Advanced Practice Providers (APPs -  Physician Assistants and Nurse Practitioners) who all work together to provide you with the care you need, when you need it.   Your next appointment:    1-2 months  Provider:   [name], MD  or APP (Advanced Practice Provider/Nurse Practitioner or Physician's Assistant)

## 2023-04-04 LAB — CBC
Hematocrit: 42.1 % (ref 34.0–46.6)
Hemoglobin: 13.3 g/dL (ref 11.1–15.9)
MCH: 29.1 pg (ref 26.6–33.0)
MCHC: 31.6 g/dL (ref 31.5–35.7)
MCV: 92 fL (ref 79–97)
Platelets: 187 10*3/uL (ref 150–450)
RBC: 4.57 x10E6/uL (ref 3.77–5.28)
RDW: 12.6 % (ref 11.7–15.4)
WBC: 9.9 10*3/uL (ref 3.4–10.8)

## 2023-04-04 LAB — BASIC METABOLIC PANEL
BUN/Creatinine Ratio: 25 (ref 12–28)
BUN: 30 mg/dL — ABNORMAL HIGH (ref 8–27)
CO2: 22 mmol/L (ref 20–29)
Calcium: 9.1 mg/dL (ref 8.7–10.3)
Chloride: 98 mmol/L (ref 96–106)
Creatinine, Ser: 1.2 mg/dL — ABNORMAL HIGH (ref 0.57–1.00)
Glucose: 127 mg/dL — ABNORMAL HIGH (ref 70–99)
Potassium: 3.6 mmol/L (ref 3.5–5.2)
Sodium: 135 mmol/L (ref 134–144)
eGFR: 48 mL/min/{1.73_m2} — ABNORMAL LOW (ref >59)

## 2023-04-07 NOTE — Progress Notes (Signed)
Attempted to call pt however, pt did not answer and their voicemail has not been set up to leave a message.

## 2023-04-08 ENCOUNTER — Ambulatory Visit (HOSPITAL_COMMUNITY)
Admission: RE | Admit: 2023-04-08 | Discharge: 2023-04-08 | Disposition: A | Discharge status: Home / Self Care | Payer: PPO | Source: Ambulatory Visit | Attending: Internal Medicine | Admitting: Internal Medicine

## 2023-04-08 ENCOUNTER — Ambulatory Visit (HOSPITAL_COMMUNITY): Payer: PPO | Admitting: Anesthesiology

## 2023-04-08 ENCOUNTER — Other Ambulatory Visit: Payer: Self-pay

## 2023-04-08 ENCOUNTER — Ambulatory Visit (HOSPITAL_COMMUNITY)
Admission: RE | Admit: 2023-04-08 | Discharge: 2023-04-08 | Disposition: A | Discharge status: Home / Self Care | Payer: PPO | Source: Ambulatory Visit | Attending: Internal Medicine

## 2023-04-08 ENCOUNTER — Encounter (HOSPITAL_COMMUNITY): Admission: RE | Payer: Self-pay | Source: Ambulatory Visit

## 2023-04-08 ENCOUNTER — Encounter (HOSPITAL_COMMUNITY)
Admission: RE | Disposition: A | Discharge status: Home / Self Care | Payer: Self-pay | Source: Ambulatory Visit | Attending: Internal Medicine

## 2023-04-08 DIAGNOSIS — I5032 Chronic diastolic (congestive) heart failure: Secondary | ICD-10-CM | POA: Diagnosis not present

## 2023-04-08 DIAGNOSIS — I110 Hypertensive heart disease with heart failure: Secondary | ICD-10-CM | POA: Diagnosis not present

## 2023-04-08 DIAGNOSIS — I480 Paroxysmal atrial fibrillation: Secondary | ICD-10-CM | POA: Insufficient documentation

## 2023-04-08 DIAGNOSIS — Z87891 Personal history of nicotine dependence: Secondary | ICD-10-CM | POA: Diagnosis not present

## 2023-04-08 DIAGNOSIS — I340 Nonrheumatic mitral (valve) insufficiency: Secondary | ICD-10-CM

## 2023-04-08 DIAGNOSIS — Z79899 Other long term (current) drug therapy: Secondary | ICD-10-CM | POA: Diagnosis not present

## 2023-04-08 DIAGNOSIS — J449 Chronic obstructive pulmonary disease, unspecified: Secondary | ICD-10-CM | POA: Insufficient documentation

## 2023-04-08 DIAGNOSIS — Z955 Presence of coronary angioplasty implant and graft: Secondary | ICD-10-CM | POA: Insufficient documentation

## 2023-04-08 DIAGNOSIS — Z8249 Family history of ischemic heart disease and other diseases of the circulatory system: Secondary | ICD-10-CM | POA: Insufficient documentation

## 2023-04-08 DIAGNOSIS — I361 Nonrheumatic tricuspid (valve) insufficiency: Secondary | ICD-10-CM | POA: Diagnosis not present

## 2023-04-08 DIAGNOSIS — Z7901 Long term (current) use of anticoagulants: Secondary | ICD-10-CM | POA: Insufficient documentation

## 2023-04-08 DIAGNOSIS — I4891 Unspecified atrial fibrillation: Secondary | ICD-10-CM

## 2023-04-08 HISTORY — PX: CARDIOVERSION: EP1203

## 2023-04-08 HISTORY — PX: TRANSESOPHAGEAL ECHOCARDIOGRAM (CATH LAB): EP1270

## 2023-04-08 LAB — ECHO TEE
AR max vel: 1.18 cm2
AV Peak grad: 10.9 mm[Hg]
Ao pk vel: 1.65 m/s
MV M vel: 4.54 m/s
MV Peak grad: 82.4 mm[Hg]
MV VTI: 1.62 cm2

## 2023-04-08 SURGERY — TRANSESOPHAGEAL ECHOCARDIOGRAM (TEE) (CATHLAB)
Anesthesia: Monitor Anesthesia Care

## 2023-04-08 MED ORDER — HYDROMORPHONE HCL 1 MG/ML IJ SOLN
0.2500 mg | INTRAMUSCULAR | Status: DC | PRN
Start: 2023-04-08 — End: 2023-04-08

## 2023-04-08 MED ORDER — PROPOFOL 500 MG/50ML IV EMUL
INTRAVENOUS | Status: DC | PRN
  Administered 2023-04-08: 50 ug/kg/min via INTRAVENOUS

## 2023-04-08 MED ORDER — LIDOCAINE 2% (20 MG/ML) 5 ML SYRINGE
INTRAMUSCULAR | Status: DC | PRN
  Administered 2023-04-08: 100 mg via INTRAVENOUS

## 2023-04-08 MED ORDER — OXYCODONE HCL 5 MG PO TABS: 5.0000 mg | ORAL_TABLET | Freq: Once | ORAL | Status: DC | PRN

## 2023-04-08 MED ORDER — PHENYLEPHRINE HCL (PRESSORS) 10 MG/ML IV SOLN
INTRAVENOUS | Status: DC | PRN
  Administered 2023-04-08 (×7): 80 ug via INTRAVENOUS

## 2023-04-08 MED ORDER — PROPOFOL 10 MG/ML IV BOLUS
INTRAVENOUS | Status: DC | PRN
  Administered 2023-04-08: 80 mg via INTRAVENOUS

## 2023-04-08 MED ORDER — SODIUM CHLORIDE 0.9 % IV SOLN: 12.5000 mg | INTRAVENOUS | Status: DC | PRN

## 2023-04-08 MED ORDER — OXYCODONE HCL 5 MG/5ML PO SOLN: 5.0000 mg | Freq: Once | ORAL | Status: DC | PRN

## 2023-04-08 MED ORDER — SODIUM CHLORIDE 0.9 % IV SOLN: INTRAVENOUS | Status: DC

## 2023-04-08 SURGICAL SUPPLY — 1 items: PAD DEFIB RADIO PHYSIO CONN (PAD) ×1 IMPLANT

## 2023-04-08 NOTE — Anesthesia Preprocedure Evaluation (Signed)
Anesthesia Evaluation  Patient identified by MRN, date of birth, ID band Patient awake    Reviewed: Allergy & Precautions, NPO status , Patient's Chart, lab work & pertinent test results  Airway Mallampati: II  TM Distance: >3 FB Neck ROM: Limited    Dental no notable dental hx.    Pulmonary COPD, former smoker   Pulmonary exam normal        Cardiovascular hypertension, Pt. on medications and Pt. on home beta blockers +CHF  + pacemaker  Rhythm:Regular Rate:Normal     Neuro/Psych negative neurological ROS  negative psych ROS   GI/Hepatic negative GI ROS, Neg liver ROS,,,  Endo/Other  negative endocrine ROS    Renal/GU negative Renal ROS  negative genitourinary   Musculoskeletal  (+) Arthritis , Osteoarthritis,    Abdominal Normal abdominal exam  (+)   Peds  Hematology Lab Results      Component                Value               Date                      WBC                      5.9                 09/10/2022                HGB                      13.9                09/10/2022                HCT                      42.5                09/10/2022                MCV                      91.2                09/10/2022                PLT                      186                 09/10/2022             Lab Results      Component                Value               Date                      NA                       134 (L)             09/10/2022                K  3.7                 09/10/2022                CO2                      23                  09/10/2022                GLUCOSE                  92                  09/10/2022                BUN                      23                  09/10/2022                CREATININE               1.29 (H)            09/10/2022                CALCIUM                  9.3                 09/10/2022                EGFR                     46                   04/17/2022                GFRNONAA                 44 (L)              09/10/2022              Anesthesia Other Findings   Reproductive/Obstetrics                             Anesthesia Physical Anesthesia Plan  ASA: 3  Anesthesia Plan: MAC   Post-op Pain Management:    Induction: Intravenous  PONV Risk Score and Plan: 2 and Ondansetron, Dexamethasone and Treatment may vary due to age or medical condition  Airway Management Planned: Nasal Cannula  Additional Equipment: None  Intra-op Plan:   Post-operative Plan:   Informed Consent: I have reviewed the patients History and Physical, chart, labs and discussed the procedure including the risks, benefits and alternatives for the proposed anesthesia with the patient or authorized representative who has indicated his/her understanding and acceptance.     Dental advisory given  Plan Discussed with: CRNA  Anesthesia Plan Comments: (PAT note written by Shonna Chock, PA-C.  )       Anesthesia Quick Evaluation

## 2023-04-08 NOTE — CV Procedure (Signed)
INDICATIONS: Atrial fibrillation  PROCEDURE:   Informed consent was obtained prior to the procedure. The risks, benefits and alternatives for the procedure were discussed and the patient comprehended these risks.  Risks include, but are not limited to, cough, sore throat, vomiting, nausea, somnolence, esophageal and stomach trauma or perforation, bleeding, low blood pressure, aspiration, pneumonia, infection, trauma to the teeth and death.    After a procedural time-out, the oropharynx was anesthetized with 20% benzocaine spray.   During this procedure the patient was administered propofol per anesthesia.  The patient's heart rate, blood pressure, and oxygen saturation were monitored continuously during the procedure. The period of conscious sedation was 20 minutes, of which I was present face-to-face 100% of this time.  The transesophageal probe was inserted in the esophagus and stomach without difficulty and multiple views were obtained.  The patient was kept under observation until the patient left the procedure room.  The patient left the procedure room in stable condition.   Agitated microbubble saline contrast was administered.  COMPLICATIONS:    There were no immediate complications.  FINDINGS:   FORMAL ECHOCARDIOGRAM REPORT PENDING Normal LV function Mildly reduced RV function Moderate TR Moderate MR.  No LA appendage thrombus  RECOMMENDATIONS:     Proceed to cardioversion   Procedure: Electrical Cardioversion Indications:  Atrial Fibrillation  Procedure Details:  Consent: Risks of procedure as well as the alternatives and risks of each were explained to the (patient/caregiver).  Consent for procedure obtained.  Time Out: Verified patient identification, verified procedure, site/side was marked, verified correct patient position, special equipment/implants available, medications/allergies/relevent history reviewed, required imaging and test results available.  PERFORMED.  Patient placed on cardiac monitor, pulse oximetry, supplemental oxygen as necessary.  Sedation given:  propofol per anesthesia Pacer pads placed anterior chest.  Cardioverted 1 time(s).  Cardioversion with synchronized biphasic 200J shock.  Evaluation: Findings: Post procedure EKG shows:  SR with PACs and atrial pacing Complications: None Patient did tolerate procedure well.  Time Spent Directly with the Patient:  45 minutes   Sheila Morales 04/08/2023, 11:16 AM

## 2023-04-08 NOTE — Transfer of Care (Signed)
Immediate Anesthesia Transfer of Care Note  Patient: Sheila Morales  Procedure(s) Performed: TRANSESOPHAGEAL ECHOCARDIOGRAM CARDIOVERSION  Patient Location: Cath Lab  Anesthesia Type:MAC  Level of Consciousness: awake, alert , and oriented  Airway & Oxygen Therapy: Patient Spontanous Breathing  Post-op Assessment: Report given to RN and Post -op Vital signs reviewed and stable  Post vital signs: Reviewed and stable  Last Vitals:  Vitals Value Taken Time  BP    Temp    Pulse    Resp    SpO2      Last Pain:  Vitals:   04/08/23 1000  TempSrc:   PainSc: 0-No pain         Complications: No notable events documented.

## 2023-04-08 NOTE — Interval H&P Note (Signed)
History and Physical Interval Note:  04/08/2023 11:19 AM  Sheila Morales  has presented today for surgery, with the diagnosis of afib.  The various methods of treatment have been discussed with the patient and family. After consideration of risks, benefits and other options for treatment, the patient has consented to  Procedure(s): TRANSESOPHAGEAL ECHOCARDIOGRAM (N/A) CARDIOVERSION (N/A) as a surgical intervention.  The patient's history has been reviewed, patient examined, no change in status, stable for surgery.  I have reviewed the patient's chart and labs.  Questions were answered to the patient's satisfaction.     [name]

## 2023-04-08 NOTE — Progress Notes (Signed)
Echocardiogram 2D Echocardiogram has been performed.  Sheila Morales 04/08/2023, 11:21 AM

## 2023-04-09 ENCOUNTER — Encounter (HOSPITAL_COMMUNITY): Payer: Self-pay | Admitting: Internal Medicine

## 2023-04-09 NOTE — Anesthesia Postprocedure Evaluation (Signed)
Anesthesia Post Note  Patient: Sheila Morales  Procedure(s) Performed: TRANSESOPHAGEAL ECHOCARDIOGRAM CARDIOVERSION     Patient location during evaluation: PACU Anesthesia Type: MAC Level of consciousness: awake and alert Pain management: pain level controlled Vital Signs Assessment: post-procedure vital signs reviewed and stable Respiratory status: spontaneous breathing, nonlabored ventilation and respiratory function stable Cardiovascular status: blood pressure returned to baseline and stable Postop Assessment: no apparent nausea or vomiting Anesthetic complications: no   No notable events documented.  Last Vitals:  Vitals:   04/08/23 1140 04/08/23 1147  BP: 97/66 (!) 95/59  Pulse:  70  Resp: 17 16  Temp:    SpO2:  92%    Last Pain:  Vitals:   04/08/23 1127  TempSrc: Temporal  PainSc: 0-No pain                 Lowella Curb

## 2023-04-10 ENCOUNTER — Telehealth: Payer: Self-pay | Admitting: Internal Medicine

## 2023-04-10 NOTE — Telephone Encounter (Signed)
Patient is concerned her heart rate may be too high. Patient stated her heart rate is ranging around 60's-80's. Please advise.

## 2023-04-10 NOTE — Telephone Encounter (Signed)
Spoke with Pt. Pt wanted to know how long a battery on a Kardia device lasted and what type of battery it took. Told pt I was not sure and we could look that up together. Found information online for pt.  Pt also wanted to know what a normal HR was. Pt asked about SOB. I asked pt if she was experiencing SOB and she stated sometimes with movement. Pt did not appear to be SOB over the phone at all. Advised pt to let us know if she became SOB or DOE and 911/ED precautions in case. Pt stated understanding and thanked me for time.

## 2023-04-17 DIAGNOSIS — G4733 Obstructive sleep apnea (adult) (pediatric): Secondary | ICD-10-CM | POA: Diagnosis not present

## 2023-04-28 DIAGNOSIS — U071 COVID-19: Secondary | ICD-10-CM | POA: Diagnosis not present

## 2023-04-28 DIAGNOSIS — G4733 Obstructive sleep apnea (adult) (pediatric): Secondary | ICD-10-CM | POA: Diagnosis not present

## 2023-04-28 DIAGNOSIS — J849 Interstitial pulmonary disease, unspecified: Secondary | ICD-10-CM | POA: Diagnosis not present

## 2023-05-05 ENCOUNTER — Ambulatory Visit: Payer: PPO | Admitting: Nurse Practitioner

## 2023-05-05 ENCOUNTER — Ambulatory Visit: Payer: PPO | Attending: Internal Medicine | Admitting: Internal Medicine

## 2023-05-05 ENCOUNTER — Encounter: Payer: Self-pay | Admitting: Internal Medicine

## 2023-05-05 VITALS — BP 90/50 | HR 76 | Ht 62.0 in | Wt 130.0 lb

## 2023-05-05 DIAGNOSIS — I427 Cardiomyopathy due to drug and external agent: Secondary | ICD-10-CM | POA: Diagnosis not present

## 2023-05-05 DIAGNOSIS — Z950 Presence of cardiac pacemaker: Secondary | ICD-10-CM | POA: Diagnosis not present

## 2023-05-05 DIAGNOSIS — I480 Paroxysmal atrial fibrillation: Secondary | ICD-10-CM

## 2023-05-05 DIAGNOSIS — E222 Syndrome of inappropriate secretion of antidiuretic hormone: Secondary | ICD-10-CM

## 2023-05-05 DIAGNOSIS — I10 Essential (primary) hypertension: Secondary | ICD-10-CM | POA: Diagnosis not present

## 2023-05-05 DIAGNOSIS — I484 Atypical atrial flutter: Secondary | ICD-10-CM | POA: Diagnosis not present

## 2023-05-05 DIAGNOSIS — D6869 Other thrombophilia: Secondary | ICD-10-CM

## 2023-05-05 DIAGNOSIS — I495 Sick sinus syndrome: Secondary | ICD-10-CM

## 2023-05-05 DIAGNOSIS — I5032 Chronic diastolic (congestive) heart failure: Secondary | ICD-10-CM | POA: Diagnosis not present

## 2023-05-05 NOTE — Progress Notes (Signed)
 Cardiology Office Note:  .   Date:  05/05/2023  ID:  Sheila Morales, DOB 03-19-1951, MRN [id number] PCP: Rollene Almarie LABOR, MD  [facility] Providers Cardiologist:  Soyla LABOR Merck, MD Electrophysiologist:  Danelle Birmingham, MD    History of Present Illness: .   Sheila Morales is a 73 y.o. female.  Discussed the use of AI scribe software for clinical note transcription with the patient, who gave verbal consent to proceed.  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 The patient reports no significant improvement in sleep quality despite using a CPAP machine for sleep apnea. The patient also reports an increase in leg cramps, for which she has started taking a magnesium  supplement. The patient's dry eyes have been worsening, with increased watering and the presence of a white discharge. The patient has been using Restasis  and Systane for the dry eyes. The patient's lupus has been stable since stopping Plaquenil . The patient's heart failure symptoms have improved since stopping Plaquenil . The patient's sodium levels have been stable, with the last reading at 135. The patient is currently not on any medication for lupus.        ROS: negative except per HPI above.  Studies Reviewed: SABRA   EKG Interpretation Date/Time:  Monday May 05 2023 10:48:13 EST Ventricular Rate:  76 PR Interval:  158 QRS Duration:  72 QT Interval:  374 QTC Calculation: 420 R Axis:   56  Text Interpretation: Atrial-paced rhythm When compared with ECG of 08-Apr-2023 11:35, No significant change was found Confirmed by Merck Soyla ([id number]) on 05/05/2023 11:34:10 AM    Results   LABS Sodium: 135 (05/02/2023)     Risk Assessment/Calculations:    CHA2DS2-VASc Score = 4   This indicates a 4.8% annual risk of  stroke. The patient's score is based upon: CHF History: 1 HTN History: 1 Diabetes History: 0 Stroke History: 0 Vascular Disease History: 0 Age Score: 1 Gender Score: 1       Physical Exam:   VS:  BP (!) 90/50   Pulse 76   Ht 5' 2 (1.575 m)   Wt 130 lb (59 kg)   SpO2 95%   BMI 23.78 kg/m    Wt Readings from Last 3 Encounters:  05/05/23 130 lb (59 kg)  04/03/23 132 lb 9.6 oz (60.1 kg)  02/27/23 128 lb 6.4 oz (58.2 kg)     Physical Exam   MEASUREMENTS: WT- 121 pounds CARDIOVASCULAR: Normal heart sounds EXTREMITIES: No edema in lower extremities     GEN: Well nourished, well developed in no acute distress NECK: No JVD; No carotid bruits CARDIAC: RRR, no murmurs, rubs, gallops RESPIRATORY:  Clear to auscultation without rales, wheezing or rhonchi  ABDOMEN: Soft, non-tender, non-distended EXTREMITIES:  No edema; No deformity   ASSESSMENT AND PLAN: .    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 Pacemaker is functioning well and patient is in normal rhythm. KardiaMobile readings are inconsistent due to  pacemaker. -Continue current management and monitoring with pacemaker.  Heart Failure Symptoms have improved since discontinuation of Plaquenil . Patient's weight is stable with no evident edema.   Hyponatremia Sodium level is stable (last checked at 135).   Muscle Cramps Patient reports increased leg cramps. Recently started a magnesium  complex supplement which may be helping. -Continue magnesium  supplement as tolerated. -Consider  increasing dietary potassium intake to help manage cramps.  CPAP Use for Sleep Apnea Patient reports inconsistent sleep patterns and does not feel a significant improvement in sleep quality with CPAP use. However, CPAP usage time is gradually increasing. -Continue CPAP use as it may be helping to manage heart pressure even if symptomatic improvement is not noticeable.  General Health Maintenance / Followup Plans -Continue current exercise regimen including gym workouts and movement classes. -Follow-up in 6 months unless new issues arise.

## 2023-05-05 NOTE — Patient Instructions (Signed)
 Medication Instructions:  Your physician recommends that you continue on your current medications as directed. Please refer to the Current Medication list given to you today.  *If you need a refill on your cardiac medications before your next appointment, please call your pharmacy*  Lab Work: None  Follow-Up: At [facility], you and your health needs are our priority.  As part of our continuing mission to provide you with exceptional heart care, we have created designated Provider Care Teams.  These Care Teams include your primary Cardiologist (physician) and Advanced Practice Providers (APPs -  Physician Assistants and Nurse Practitioners) who all work together to provide you with the care you need, when you need it.  Your next appointment:   6 month(s)  Provider:   [name], MD

## 2023-05-06 DIAGNOSIS — E871 Hypo-osmolality and hyponatremia: Secondary | ICD-10-CM | POA: Diagnosis not present

## 2023-05-06 DIAGNOSIS — M329 Systemic lupus erythematosus, unspecified: Secondary | ICD-10-CM | POA: Diagnosis not present

## 2023-05-06 DIAGNOSIS — N1831 Chronic kidney disease, stage 3a: Secondary | ICD-10-CM | POA: Diagnosis not present

## 2023-05-06 DIAGNOSIS — I129 Hypertensive chronic kidney disease with stage 1 through stage 4 chronic kidney disease, or unspecified chronic kidney disease: Secondary | ICD-10-CM | POA: Diagnosis not present

## 2023-05-07 DIAGNOSIS — H04123 Dry eye syndrome of bilateral lacrimal glands: Secondary | ICD-10-CM | POA: Diagnosis not present

## 2023-05-08 LAB — LAB REPORT - SCANNED
Albumin, Urine POC: <3
Creatinine, POC: 21 mg/dL
Microalb Creat Ratio: <14

## 2023-05-09 ENCOUNTER — Other Ambulatory Visit: Payer: Self-pay | Admitting: Internal Medicine

## 2023-05-09 ENCOUNTER — Other Ambulatory Visit: Payer: Self-pay | Admitting: Nurse Practitioner

## 2023-05-09 DIAGNOSIS — J3089 Other allergic rhinitis: Secondary | ICD-10-CM

## 2023-05-09 DIAGNOSIS — I480 Paroxysmal atrial fibrillation: Secondary | ICD-10-CM

## 2023-05-09 DIAGNOSIS — J040 Acute laryngitis: Secondary | ICD-10-CM

## 2023-05-16 ENCOUNTER — Other Ambulatory Visit: Payer: Self-pay | Admitting: Internal Medicine

## 2023-05-21 DIAGNOSIS — L603 Nail dystrophy: Secondary | ICD-10-CM | POA: Diagnosis not present

## 2023-05-26 ENCOUNTER — Ambulatory Visit (INDEPENDENT_AMBULATORY_CARE_PROVIDER_SITE_OTHER): Payer: PPO

## 2023-05-26 DIAGNOSIS — I495 Sick sinus syndrome: Secondary | ICD-10-CM

## 2023-05-27 ENCOUNTER — Encounter: Payer: Self-pay | Admitting: Internal Medicine

## 2023-05-27 LAB — CUP PACEART REMOTE DEVICE CHECK
Battery Voltage: 75
Date Time Interrogation Session: 20250203095128
Implantable Lead Connection Status: 753985
Implantable Lead Connection Status: 753985
Implantable Lead Implant Date: 20220204
Implantable Lead Implant Date: 20220204
Implantable Lead Location: 753859
Implantable Lead Location: 753860
Implantable Lead Model: 377169
Implantable Lead Model: 377171
Implantable Lead Serial Number: 8000143637
Implantable Lead Serial Number: 8000151447
Implantable Pulse Generator Implant Date: 20220204
Pulse Gen Model: 407145
Pulse Gen Serial Number: 70020732

## 2023-05-29 DIAGNOSIS — R413 Other amnesia: Secondary | ICD-10-CM | POA: Diagnosis not present

## 2023-05-29 DIAGNOSIS — I509 Heart failure, unspecified: Secondary | ICD-10-CM | POA: Diagnosis not present

## 2023-05-29 DIAGNOSIS — D6859 Other primary thrombophilia: Secondary | ICD-10-CM | POA: Diagnosis not present

## 2023-05-29 DIAGNOSIS — J449 Chronic obstructive pulmonary disease, unspecified: Secondary | ICD-10-CM | POA: Diagnosis not present

## 2023-05-29 DIAGNOSIS — Z0000 Encounter for general adult medical examination without abnormal findings: Secondary | ICD-10-CM | POA: Diagnosis not present

## 2023-05-29 DIAGNOSIS — G629 Polyneuropathy, unspecified: Secondary | ICD-10-CM | POA: Diagnosis not present

## 2023-05-29 DIAGNOSIS — R5383 Other fatigue: Secondary | ICD-10-CM | POA: Diagnosis not present

## 2023-05-29 DIAGNOSIS — M329 Systemic lupus erythematosus, unspecified: Secondary | ICD-10-CM | POA: Diagnosis not present

## 2023-05-29 DIAGNOSIS — I7300 Raynaud's syndrome without gangrene: Secondary | ICD-10-CM | POA: Diagnosis not present

## 2023-05-29 DIAGNOSIS — R945 Abnormal results of liver function studies: Secondary | ICD-10-CM | POA: Diagnosis not present

## 2023-05-29 DIAGNOSIS — I480 Paroxysmal atrial fibrillation: Secondary | ICD-10-CM | POA: Diagnosis not present

## 2023-05-29 DIAGNOSIS — B351 Tinea unguium: Secondary | ICD-10-CM | POA: Diagnosis not present

## 2023-05-30 ENCOUNTER — Other Ambulatory Visit: Payer: Self-pay | Admitting: Internal Medicine

## 2023-05-30 DIAGNOSIS — R413 Other amnesia: Secondary | ICD-10-CM

## 2023-06-05 DIAGNOSIS — G4733 Obstructive sleep apnea (adult) (pediatric): Secondary | ICD-10-CM | POA: Diagnosis not present

## 2023-06-10 ENCOUNTER — Other Ambulatory Visit (HOSPITAL_COMMUNITY): Payer: Self-pay | Admitting: Internal Medicine

## 2023-06-10 ENCOUNTER — Other Ambulatory Visit: Payer: Self-pay | Admitting: Internal Medicine

## 2023-06-10 DIAGNOSIS — R413 Other amnesia: Secondary | ICD-10-CM

## 2023-06-11 DIAGNOSIS — G4733 Obstructive sleep apnea (adult) (pediatric): Secondary | ICD-10-CM | POA: Diagnosis not present

## 2023-06-17 ENCOUNTER — Telehealth: Payer: Self-pay | Admitting: Internal Medicine

## 2023-06-17 DIAGNOSIS — I480 Paroxysmal atrial fibrillation: Secondary | ICD-10-CM

## 2023-06-17 MED ORDER — APIXABAN 5 MG PO TABS
5.0000 mg | ORAL_TABLET | Freq: Two times a day (BID) | ORAL | 1 refills | Status: DC
Start: 2023-06-17 — End: 2023-12-18

## 2023-06-17 NOTE — Telephone Encounter (Signed)
*  STAT* If patient is at the pharmacy, call can be transferred to refill team.   1. Which medications need to be refilled? (please list name of each medication and dose if known)   apixaban (ELIQUIS) 5 MG TABS tablet     4. Which pharmacy/location (including street and city if local pharmacy) is medication to be sent to? [id number] [place],  - [address]     5. Do they need a 30 day or 90 day supply? 90

## 2023-06-17 NOTE — Telephone Encounter (Signed)
 Prescription refill request for Eliquis received. Indication: PAF Last office visit: 05/05/23  Elspeth Cho MD Scr: 1.20 on 04/03/23  Epic Age: 73 Weight: 59kg  Based on above findings Eliquis 5mg  twice daily is the appropriate dose.  Refill approved.

## 2023-06-18 DIAGNOSIS — R748 Abnormal levels of other serum enzymes: Secondary | ICD-10-CM | POA: Diagnosis not present

## 2023-06-19 DIAGNOSIS — M810 Age-related osteoporosis without current pathological fracture: Secondary | ICD-10-CM | POA: Diagnosis not present

## 2023-06-24 ENCOUNTER — Ambulatory Visit (HOSPITAL_COMMUNITY)
Admission: RE | Admit: 2023-06-24 | Discharge: 2023-06-24 | Disposition: A | Discharge status: Home / Self Care | Payer: PPO | Source: Ambulatory Visit | Attending: Internal Medicine | Admitting: Internal Medicine

## 2023-06-24 DIAGNOSIS — R413 Other amnesia: Secondary | ICD-10-CM | POA: Diagnosis not present

## 2023-06-25 NOTE — Telephone Encounter (Signed)
*  STAT* If patient is at the pharmacy, call can be transferred to refill team.   1. Which medications need to be refilled? (please list name of each medication and dose if known) Jardiance   2. Would you like to learn more about the convenience, safety, & potential cost savings by using the [facility]?     3. Are you open to using the [facility] (Type [facility].    4. Which pharmacy/location (including street and city if local pharmacy) is medication to be sent to?[facility] [address] [place]   5. Do they need a 30 day or 90 day supply?  90 days sand refills

## 2023-07-01 ENCOUNTER — Telehealth: Payer: Self-pay | Admitting: Adult Health

## 2023-07-01 DIAGNOSIS — R0683 Snoring: Secondary | ICD-10-CM

## 2023-07-01 NOTE — Addendum Note (Signed)
 Addended by: Geralyn Flash D on: 07/01/2023 12:32 PM   Modules accepted: Orders

## 2023-07-01 NOTE — Progress Notes (Signed)
 Remote pacemaker transmission.

## 2023-07-01 NOTE — Telephone Encounter (Signed)
 Patient would like to know if there is another supplier that she can use besides [facility] for her cpap machine. Please call and advise [phone]

## 2023-07-02 DIAGNOSIS — G4733 Obstructive sleep apnea (adult) (pediatric): Secondary | ICD-10-CM

## 2023-07-02 DIAGNOSIS — R0683 Snoring: Secondary | ICD-10-CM

## 2023-07-02 NOTE — Telephone Encounter (Signed)
 PT calling again. I advised her we tried to call her a few minutes ago. She said there is something wrong with the internet on her phone. Please try again. She states it is urgent Thanks.

## 2023-07-02 NOTE — Telephone Encounter (Signed)
 Message sent to [facility] to investigate if based on insurance if she can change DME companies.

## 2023-07-02 NOTE — Telephone Encounter (Signed)
 ATC patient, no answer.  No VM set up.

## 2023-07-03 NOTE — Telephone Encounter (Signed)
 Would recommend trying to wear each night for 4hr -needs to be at least 70% of time >4hr  Please get her to come in for office visit so we can troubleshoot.

## 2023-07-04 NOTE — Telephone Encounter (Signed)
 Called and spoke with patient/spouse.  They want to hold off on doing anything until Monday 3/17, they are waiting to hear back from their insurance company regarding the CPAP machine she is renting, she has had it for about 4 months and they were discussing getting it for a prorated price.  He will send a mychart message on Monday letting us know how he wants to proceed.  I advised him of the information provided by the [facility] that [facility] takes their insurance.  He said if he cannot get the machine for a prorated price, they will likely go will likely go with [facility].  Will hold message until Monday and wait for response from patient.

## 2023-07-08 ENCOUNTER — Other Ambulatory Visit: Payer: Self-pay | Admitting: Internal Medicine

## 2023-07-08 NOTE — Telephone Encounter (Signed)
 Per Kennyth Arnold at [employer], her insurance requires that the Sleep testing be no more than 6 months from the date of the order, she will need new testing or an current office visit that documents the reason for the delay. Please advise. Thank You

## 2023-07-08 NOTE — Telephone Encounter (Signed)
 Sorry for the confusion. The patient just needs a sleep study because the last one was done in July and they are only good for one year. As soon as she has a sleep study [facility] can fill her CPAP order.

## 2023-07-09 IMAGING — PT NM PET CT CARDIAC PERF MULTI W/ABSOLUTE BLOODFLOW
8 series · 25 of 25 positions shown · non-contrast
Comparison: CT chest, abdomen and pelvis from 04/24/2021

CLINICAL DATA: This over-read does not include interpretation of
cardiac or coronary anatomy or pathology. The Cardiac PET CT
interpretation by the cardiologist is attached.

[Series 3: ct rest · axial · 3.0mm · 0.92mm/px · 1 of 83 slices shown]
[im 1/83  soft-tissue]
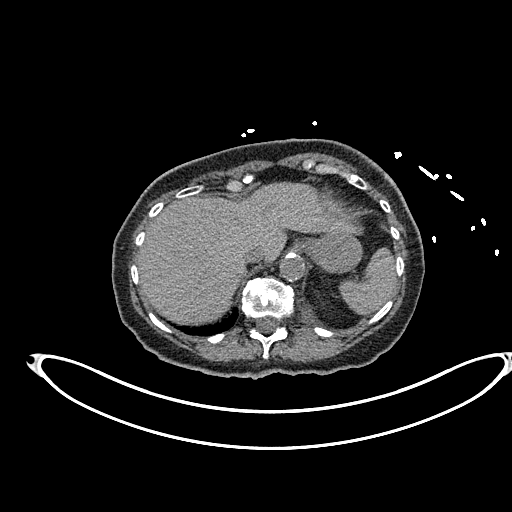

[Series 4: ct rest lung · axial · 3.0mm · 0.92mm/px · 1 of 83 slices shown]
[im 1/83  soft-tissue]
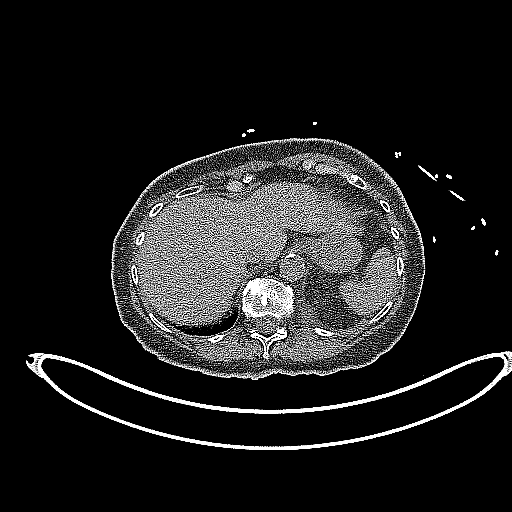

[Series 7: pet rest cardiac dynamic · axial · 3.0mm · 2.04mm/px · z∈[-344,-180]mm · 8 of 2228 slices shown]
[im 1/2228]
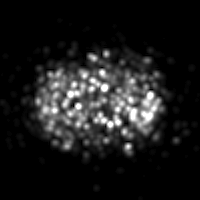
[im 319/2228]
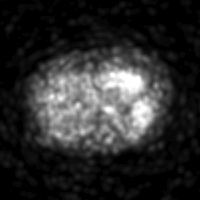
[im 637/2228]
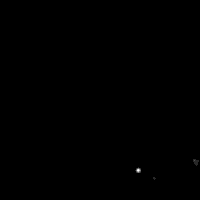
[im 955/2228]
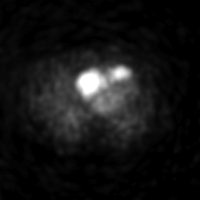
[im 1273/2228]
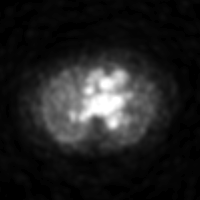
[im 1591/2228]
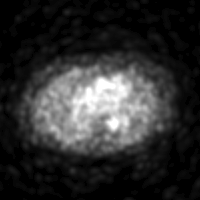
[im 1909/2228]
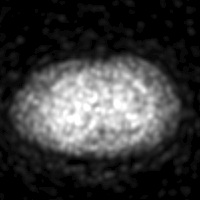
[im 2228/2228]
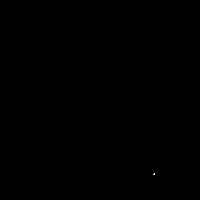

[Series 8: pet rest cardiac gated · axial · 3.0mm · 2.04mm/px · z∈[-344,-180]mm · 2 of 664 slices shown]
[im 1/664]
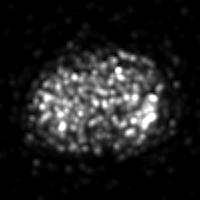
[im 664/664]
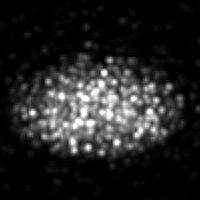

[Series 10: pet rest cardiac static · axial · 3.0mm · 2.04mm/px · 1 of 83 slices shown]
[im 1/83]
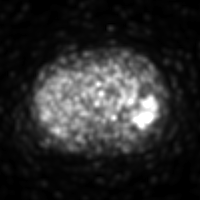

[Series 16: pet stress cardiac gated · axial · 3.0mm · 2.04mm/px · z∈[-344,-180]mm · 2 of 664 slices shown]
[im 1/664]
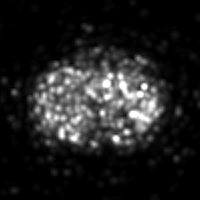
[im 664/664]
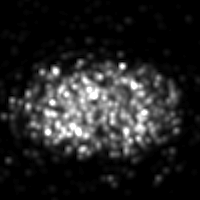

[Series 17: pet stress cardiac ac static · axial · 3.0mm · 2.04mm/px · 1 of 83 slices shown]
[im 1/83]
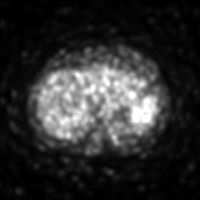

[Series 18: pet stress cardiac dynamic · axial · 3.0mm · 2.04mm/px · z∈[-344,-180]mm · 9 of 2367 slices shown]
[im 1/2367]
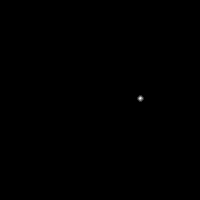
[im 296/2367]
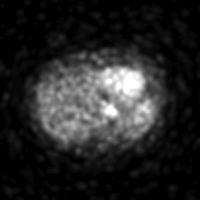
[im 592/2367]
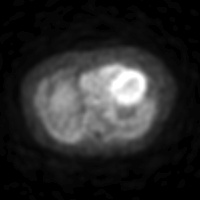
[im 888/2367]
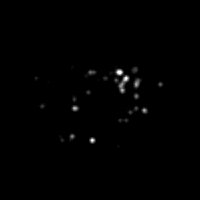
[im 1184/2367]
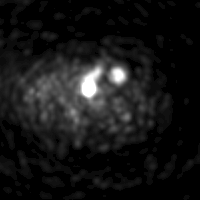
[im 1479/2367]
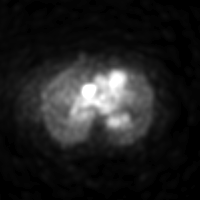
[im 1775/2367]
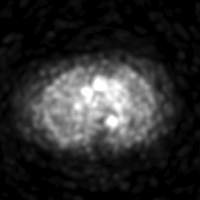
[im 2071/2367]
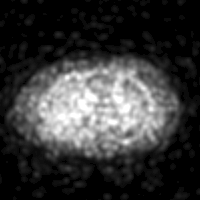
[im 2367/2367]
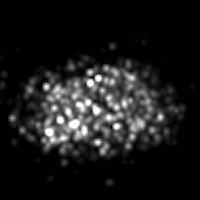

[25 of 25 positions shown; findings below may reference images not displayed]

FINDINGS: Vascular: There is a left chest wall pacer device with leads in the
right atrial appendage and right ventricle. Cardiac enlargement is
noted. Aortic atherosclerosis and coronary artery calcifications are
again seen. No pericardial effusion.

Mediastinum/Nodes: Prominent mediastinal lymph nodes are again
noted. None of these meet CT criteria for adenopathy. Index right
paratracheal lymph node measures 1.3 cm, image [DATE]. Formally
cm. No mass identified. Visualized portions of the lower airway and
esophagus are unremarkable.

Lungs/Pleura: Centrilobular and paraseptal emphysema identified.
Signs of chronic postinflammatory changes are identified within both
lungs demonstrating a peripheral and lower lung zone predominant
areas of interstitial reticulation and mild diffuse ground-glass
attenuation. Pleural thickening is noted overlying the lower lobes
posteriorly. No acute airspace consolidation, atelectasis, or
pneumothorax identified. No suspicious pulmonary nodule or mass
identified.

Upper Abdomen: No acute abnormality within the imaged portions of
the upper abdomen.

Musculoskeletal: No acute or suspicious osseous findings.
IMPRESSION: 1. Chronic postinflammatory changes are again identified within both
lungs with areas of peripheral and lower lung zone predominant
interstitial reticulation as well as diffuse ground-glass
attenuation.
2. Prominent mediastinal lymph nodes (which do not meet CT criteria
for adenopathy) are again noted and are favored to represent
reactive adenopathy.

## 2023-07-09 NOTE — Telephone Encounter (Signed)
 Spoke to patient. She is aware of need for repeat sleep study. She agrees with plan. Order has been placed for spilt night. Nothing further needed.  Routing back to Dr. Judeth Horn as an Lorain Childes.

## 2023-07-10 DIAGNOSIS — Z0000 Encounter for general adult medical examination without abnormal findings: Secondary | ICD-10-CM | POA: Diagnosis not present

## 2023-07-10 DIAGNOSIS — M810 Age-related osteoporosis without current pathological fracture: Secondary | ICD-10-CM | POA: Diagnosis not present

## 2023-07-10 DIAGNOSIS — R748 Abnormal levels of other serum enzymes: Secondary | ICD-10-CM | POA: Diagnosis not present

## 2023-07-10 DIAGNOSIS — R413 Other amnesia: Secondary | ICD-10-CM | POA: Diagnosis not present

## 2023-07-10 DIAGNOSIS — H524 Presbyopia: Secondary | ICD-10-CM | POA: Diagnosis not present

## 2023-07-11 ENCOUNTER — Telehealth: Payer: Self-pay | Admitting: Pulmonary Disease

## 2023-07-16 ENCOUNTER — Telehealth: Payer: Self-pay | Admitting: Pulmonary Disease

## 2023-07-16 NOTE — Telephone Encounter (Signed)
 Per Kennyth Arnold at [facility]- Unfortunately we will be unable to provided a cpap for the patient, When we attempted to obtain an authorization she has a cpap from another provider that has been billing her insurance since Feb 24, 2023, we would not be able to bill for the entire rental period.  She will need to stay with her current provider until the machine converts to purchase and at that time we be happy to transfer her care to [place] as a supply only transfer pt.    Just spoke with patient she said she isn't sure what she should do because they have already turned her CPAP machine in to her old DME company. She wasn't able to tell me which company they turned it in to. They want me to call back when she gets home and speak to her husband about it.

## 2023-07-16 NOTE — Telephone Encounter (Signed)
 Spoke to [place] with [facility] who advised pt was made aware that her insurance will not cover the cost if pt is non-compliant.  At that time, pt turned over the phone to spouse who advised on 07/01/23 15 12:56 PM that he will return the cpap to [facility] & find another supplier.  As of now, cpap has not been returned to [facility].  A different DME is unable to send to another DME until device is returned.  Per pt's insurance, since pt was non-complaint with the cpap, pt will need to completely restart the process &  complete a new sleep study.

## 2023-07-23 ENCOUNTER — Other Ambulatory Visit: Payer: Self-pay | Admitting: Pulmonary Disease

## 2023-07-23 NOTE — Telephone Encounter (Signed)
**Note De-identified  [place] Obfuscation** Please advise 

## 2023-07-30 NOTE — Telephone Encounter (Signed)
 CMN faxed successfully and signed.

## 2023-08-20 DIAGNOSIS — L603 Nail dystrophy: Secondary | ICD-10-CM | POA: Diagnosis not present

## 2023-08-20 DIAGNOSIS — L930 Discoid lupus erythematosus: Secondary | ICD-10-CM | POA: Diagnosis not present

## 2023-08-21 DIAGNOSIS — R748 Abnormal levels of other serum enzymes: Secondary | ICD-10-CM | POA: Diagnosis not present

## 2023-08-25 ENCOUNTER — Ambulatory Visit: Payer: PPO

## 2023-08-25 DIAGNOSIS — I495 Sick sinus syndrome: Secondary | ICD-10-CM | POA: Diagnosis not present

## 2023-08-25 LAB — CUP PACEART REMOTE DEVICE CHECK
Date Time Interrogation Session: 20250505153832
Implantable Lead Connection Status: 753985
Implantable Lead Connection Status: 753985
Implantable Lead Implant Date: 20220204
Implantable Lead Implant Date: 20220204
Implantable Lead Location: 753859
Implantable Lead Location: 753860
Implantable Lead Model: 377169
Implantable Lead Model: 377171
Implantable Lead Serial Number: 8000143637
Implantable Lead Serial Number: 8000151447
Implantable Pulse Generator Implant Date: 20220204
Pulse Gen Model: 407145
Pulse Gen Serial Number: 70020732

## 2023-08-26 ENCOUNTER — Encounter: Payer: Self-pay | Admitting: Internal Medicine

## 2023-08-26 DIAGNOSIS — M810 Age-related osteoporosis without current pathological fracture: Secondary | ICD-10-CM | POA: Diagnosis not present

## 2023-08-26 DIAGNOSIS — J449 Chronic obstructive pulmonary disease, unspecified: Secondary | ICD-10-CM | POA: Diagnosis not present

## 2023-08-26 DIAGNOSIS — I509 Heart failure, unspecified: Secondary | ICD-10-CM | POA: Diagnosis not present

## 2023-08-26 DIAGNOSIS — I480 Paroxysmal atrial fibrillation: Secondary | ICD-10-CM | POA: Diagnosis not present

## 2023-09-10 DIAGNOSIS — M4802 Spinal stenosis, cervical region: Secondary | ICD-10-CM | POA: Diagnosis not present

## 2023-09-10 DIAGNOSIS — Z79899 Other long term (current) drug therapy: Secondary | ICD-10-CM | POA: Diagnosis not present

## 2023-09-10 DIAGNOSIS — I7300 Raynaud's syndrome without gangrene: Secondary | ICD-10-CM | POA: Diagnosis not present

## 2023-09-10 DIAGNOSIS — M329 Systemic lupus erythematosus, unspecified: Secondary | ICD-10-CM | POA: Diagnosis not present

## 2023-09-10 DIAGNOSIS — M48061 Spinal stenosis, lumbar region without neurogenic claudication: Secondary | ICD-10-CM | POA: Diagnosis not present

## 2023-09-10 DIAGNOSIS — Z6822 Body mass index (BMI) 22.0-22.9, adult: Secondary | ICD-10-CM | POA: Diagnosis not present

## 2023-09-20 ENCOUNTER — Other Ambulatory Visit: Payer: Self-pay | Admitting: Cardiology

## 2023-09-20 MED ORDER — FUROSEMIDE 20 MG PO TABS: ORAL_TABLET | ORAL | 0 refills | Status: DC

## 2023-09-20 NOTE — Progress Notes (Signed)
 Patient called the on-call line. Refill request sent for lasix  20mg  daily, 30tabs no refills

## 2023-09-22 ENCOUNTER — Encounter: Payer: Self-pay | Admitting: Internal Medicine

## 2023-09-25 ENCOUNTER — Encounter: Payer: Self-pay | Admitting: Neurology

## 2023-09-25 ENCOUNTER — Ambulatory Visit: Payer: PPO | Admitting: Neurology

## 2023-09-25 VITALS — BP 109/63 | HR 73 | Ht 62.0 in | Wt 125.0 lb

## 2023-09-25 DIAGNOSIS — R413 Other amnesia: Secondary | ICD-10-CM

## 2023-09-25 DIAGNOSIS — F17200 Nicotine dependence, unspecified, uncomplicated: Secondary | ICD-10-CM | POA: Diagnosis not present

## 2023-09-25 DIAGNOSIS — R419 Unspecified symptoms and signs involving cognitive functions and awareness: Secondary | ICD-10-CM | POA: Diagnosis not present

## 2023-09-25 DIAGNOSIS — Z8679 Personal history of other diseases of the circulatory system: Secondary | ICD-10-CM

## 2023-09-25 DIAGNOSIS — Z8669 Personal history of other diseases of the nervous system and sense organs: Secondary | ICD-10-CM

## 2023-09-25 DIAGNOSIS — M329 Systemic lupus erythematosus, unspecified: Secondary | ICD-10-CM | POA: Diagnosis not present

## 2023-09-25 NOTE — Patient Instructions (Signed)
 It was nice to meet you today.  You have complaints of memory loss: memory loss or changes in cognitive function can have many reasons and does not always mean you have dementia.  There are several conditions and situations that can contribute to subjective or objective memory loss.  These factors include: depression, stress, sleep deprivation or poor sleep from insomnia or sleep apnea, dehydration, fluctuation in blood sugar values, thyroid  or electrolyte dysfunction, medication effects from sedating medications or narcotic pain medication for example and certain vitamin deficiencies such as vitamin B12 deficiency, and anemia. Dementia can be caused by stroke, brain atherosclerosis or brain vascular disease due to vascular risk factors (smoking, high blood pressure, high cholesterol, obesity and uncontrolled diabetes), certain degenerative brain disorders (including Parkinson's disease and Multiple sclerosis) and by Alzheimer's disease or other, more rare and sometimes hereditary causes.  In your particular case, risk factor modification and taking care of chronic medical issues will be key for your memory concerns.  Here is what I would recommend:   Continue to work on complete smoking cessation.   Continue to completely abstain from alcohol altogether.   Hydrate well with water  but avoid overhydration, talk to your cardiologist about this as your sodium level may drop and you also take Lasix .   Follow-up with all your specialists and continue to pursue treatment for sleep apnea as planned with your pulmonologist.   At this point in time, I do not believe we need for you to have a regular follow-up in this clinic.  I would be happy to see you back as needed.

## 2023-09-25 NOTE — Progress Notes (Signed)
 Subjective:    Patient ID: Sheila Morales is a 73 y.o. female.  HPI    Debbra Fairy, MD, PhD [facility] [address], [address] [address] [place], [place] [address]  Dear Dr. Schuyler Custard,  I saw your patient, [name], upon your kind request in my neurologic clinic today for initial consultation of her memory loss.  The patient is accompanied by her husband today.  As you know, Ms. Silos is a 73 year old female with an underlying complex medical history of atrial fibrillation, history of cardioversion, congestive heart failure, smoking, status post pacemaker placement, history of rheumatic fever, COPD, ischemic colitis, restless leg syndrome, lupus, obstructive sleep apnea (not currently on PAP therapy), cervical degenerative disc disease with status post ACDF in 2024, who reports forgetfulness for the past year or 2, essentially since she was diagnosed with congestive heart failure.  Sometimes she has trouble understanding something she has been reading.  She has missed read a recipe before.  Her husband endorses mild forgetfulness.  She is working on smoking cessation, currently smokes 1 to 2 cigarettes/day but actually has not had any cigarettes this week.  She has worked on alcohol cessation and has not had any alcohol for about a year.  Previously she would drink 2 glasses of wine per day.  She is in the process of getting reevaluated for her sleep apnea.  She had a CPAP or AutoPap machine last year but did not fulfill compliance criteria as I understand and lost coverage for the machine.  She is scheduled for sleep study or home sleep test this month through pulmonology. She hydrates well with water  but does have a history of low sodium.  She drinks about 3-4 20 ounce bottles of water  per day.  She limits her caffeine to 2 cups of coffee.  She tries to exercise and work on her strength.  She had physical therapy for an extended period of time in the recent past.  She had fallen  in 2023 and sustained rib fractures.  She has not fallen recently.  She sees Dr. Stefan Edge in rheumatology and gets a checkup every 6 months approximately.  She is no longer on hydroxychloroquine .  I reviewed your office note from 05/29/2023.  Her MMSE was 27/30 at the time.  She had blood work through your office including vitamin B12, TSH, CMP, lipid panel, and CBC with differential.  She is on gabapentin  300 mg daily for neuropathy.  Blood test results were reviewed in her electronic chart.  CBC with differential was benign, CMP showed BUN elevated at 28, creatinine borderline at 1.16, AST elevated at 45, ALT elevated at 38, lipid panel showed triglycerides mildly above normal at 216.  Hepatitis B core antibodies negative, vitamin B12 level was 600, TSH 1.37. She reported fatigue and drowsiness at the time.  She has been on CPAP therapy per pulmonology.  She had seen Dr. Festus Hubert with [facility] neurology in 2023 for memory deficits and concern for neuropathy and ataxia.  I reviewed the office visit note from 08/07/2021.  Her blood pressure was below normal at the time at 90/57.  She had also seen Dr. Lydia Sams for neuropathy.  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   She had an EMG and nerve conduction velocity test in 2016 which was normal at the time.   She had been hospitalized in November 2022 for hypoxic respiratory  failure.  She had developed encephalopathy.  This was deemed secondary to hyponatremia.  She had blood work in November 2024 including vitamin D  level, ferritin, TSH, vitamin B12.  I was able to review test results in her electronic chart.  Her vitamin B12 level was 592, lipid panel was notable for an increased triglyceride level at 253, LDL was 65.  CMP showed sodium below normal at 133, creatinine above normal at 1.26, AST above normal at 53, ALT above normal at 43.  She had a brain MRI without  contrast on 06/24/2023 and I reviewed the results:  IMPRESSION: 1.  No acute intracranial abnormality.  No acute infarct.   2. Moderate chronic small vessel changes. 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   In addition, I personally and independently reviewed images through the PACS system.  Her sleep study from 11/11/2022 showed an AHI of 13.9/h, O2 nadir 87%.  Her Past Medical History Is Significant For: Past Medical History:  Diagnosis Date   A-fib (HCC)    Acute respiratory failure with hypoxia (HCC) 11/05/2017   Arthritis    Cervicalgia    CHF (congestive heart failure) (HCC)    Colitis, ischemic (HCC)    COPD (chronic obstructive pulmonary disease) (HCC)    External hemorrhoids    H/O: rheumatic fever    Heart disease    IBS (irritable bowel syndrome)    Lupus (systemic lupus erythematosus) (HCC)    NAFLD (nonalcoholic fatty liver disease)    Osteoporosis    Pacemaker    Personal history colonic adenoma 03/25/2008   06/2007 right sided adenoma and 2 right hyperplastic polyps     Restless leg syndrome    questionable   Sleep apnea    Systemic lupus erythematosus (HCC)    Tubular adenoma of colon    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     Her Past Surgical History Is Significant For: Past Surgical History:  Procedure Laterality Date   ANTERIOR CERVICAL DECOMP/DISCECTOMY FUSION N/A 09/18/2022   Procedure: Anterior Cervical Discectomy Fusion Cervical Four-Cervical Five, Cervical Five-Cervical Six;  Surgeon: Gearl Keens, MD;  Location[facility] OR;  Service: Neurosurgery;  Laterality: N/A;   CARDIOVERSION N/A 04/08/2023   Procedure: CARDIOVERSION;  Surgeon: Euell Herrlich, MD;  Location: [facility] INVASIVE CV LAB;  Service: Cardiovascular;  Laterality: N/A;   CHOLECYSTECTOMY     CHOLECYSTECTOMY, LAPAROSCOPIC     COLONOSCOPY     ESOPHAGOGASTRODUODENOSCOPY     lesion, vulva excision     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   Procedure: PACEMAKER IMPLANT;  Surgeon:  Tammie Fall, MD;  Location: [facility] INVASIVE CV LAB;  Service: Cardiovascular;  Laterality: N/A;   TONSILLECTOMY     TRANSESOPHAGEAL ECHOCARDIOGRAM (CATH LAB) N/A 04/08/2023   Procedure: TRANSESOPHAGEAL ECHOCARDIOGRAM;  Surgeon: Euell Herrlich, MD;  Location: [facility] INVASIVE CV LAB;  Service: Cardiovascular;  Laterality: N/A;   TUBAL LIGATION  1977   VIDEO BRONCHOSCOPY Bilateral 11/06/2017   Procedure: VIDEO BRONCHOSCOPY WITHOUT FLUORO;  Surgeon: Mannam, Praveen, MD;  Location: [facility] ENDOSCOPY;  Service: Cardiopulmonary;  Laterality: Bilateral;    Her Family History Is Significant For: Family History  Problem Relation Age of Onset   Stroke Mother        Deceased, 25s   Atrial fibrillation Father    Colon cancer Father    Dementia Father    Dementia Sister    Other Sister        Pick's disease, deceased   Healthy Daughter  Vascular Disease Other    Other Other        PIK's disease    Her Social History Is Significant For: Social History   Socioeconomic History   Marital status: Married    Spouse name: [name]   Number of children: 2   Years of education: 12   Highest education level: Not on file  Occupational History   Occupation: Education officer, environmental business   Occupation: caregiver  Tobacco Use   Smoking status: Some Days    Current packs/day: 0.00    Average packs/day: 0.3 packs/day for 30.0 years (7.5 ttl pk-yrs)    Types: Cigarettes    Start date: 01/20/1990    Last attempt to quit: 01/21/2020    Years since quitting: 3.6   Smokeless tobacco: Never   Tobacco comments:    smokes "every now and then" 08/24/20 [facility]     09/25/2023 "I don't smoke that much anymore"  Vaping Use   Vaping status: Never Used  Substance and Sexual Activity   Alcohol use: Not Currently    Alcohol/week: 5.0 standard drinks of alcohol    Types: 5 Standard drinks or equivalent per week    Comment: 4 out of 7 dyas per husband at bedside   Drug use: Never   Sexual activity: Yes  Other Topics Concern   Not  on file  Social History Narrative   HSG. Married '71. 1 daughter- '74, '78; 2 grand daughters.Work: International aid/development worker. Lives with husband and mother-in-law is moving in after rehab.       One level home with spouse   Right handed   Caffeine - coffee 2 cups am   Exercise - treadmill "not that much. I walk a lot and go to the gym 3 times per week", does leg and arm exercises at home that was learned from PT   Education - 12th grade   Social Drivers of Health   Financial Resource Strain: Low Risk  (09/25/2022)   Overall Financial Resource Strain (CARDIA)    Difficulty of Paying Living Expenses: Not hard at all  Food Insecurity: No Food Insecurity (09/25/2022)   Hunger Vital Sign    Worried About Running Out of Food in the Last Year: Never true    Ran Out of Food in the Last Year: Never true  Transportation Needs: No Transportation Needs (09/25/2022)   PRAPARE - Administrator, Civil Service (Medical): No    Lack of Transportation (Non-Medical): No  Physical Activity: Insufficiently Active (09/25/2022)   Exercise Vital Sign    Days of Exercise per Week: 2 days    Minutes of Exercise per Session: 50 min  Stress: No Stress Concern Present (09/25/2022)   [employer] of Occupational Health - Occupational Stress Questionnaire    Feeling of Stress : Only a little  Social Connections: Unknown (09/25/2022)   Social Connection and Isolation Panel [NHANES]    Frequency of Communication with Friends and Family: More than three times a week    Frequency of Social Gatherings with Friends and Family: Once a week    Attends Religious Services: Not on Marketing executive or Organizations: Yes    Attends Banker Meetings: More than 4 times per year    Marital Status: Married    Her Allergies Are:  No Known Allergies:   Her Current Medications Are:  Outpatient Encounter Medications as of 09/25/2023  Medication Sig   acetaminophen  (TYLENOL ) 325 MG tablet Take 650  mg  by mouth every 6 (six) hours as needed for moderate pain or mild pain.   albuterol  (VENTOLIN  HFA) 108 (90 Base) MCG/ACT inhaler Inhale 1-2 puffs into the lungs every 6 (six) hours as needed for wheezing or shortness of breath.   apixaban  (ELIQUIS ) 5 MG TABS tablet Take 1 tablet (5 mg total) by mouth 2 (two) times daily.   Bacillus Coagulans-Inulin (PROBIOTIC-PREBIOTIC PO) Take by mouth. Bio Complete   benzonatate  (TESSALON ) 200 MG capsule TAKE 1 CAPSULE (200 MG TOTAL) BY MOUTH EVERY 6 (SIX) HOURS AS NEEDED FOR COUGH.   CALCIUM  PO Take 600 mg by mouth.   Cholecalciferol  (VITAMIN D ) 50 MCG (2000 UT) tablet Take 4,000 Units by mouth every evening.   DM-APAP-CPM (CORICIDIN HBP PO) Take 5 mLs by mouth every 6 (six) hours as needed (congestion).   empagliflozin  (JARDIANCE ) 10 MG TABS tablet TAKE 1 TABLET BY MOUTH DAILY BEFORE BREAKFAST.   fluticasone  (FLONASE ) 50 MCG/ACT nasal spray SPRAY 2 SPRAYS INTO EACH NOSTRIL EVERY DAY (Patient taking differently: Place 2 sprays into both nostrils daily as needed for allergies.)   furosemide  (LASIX ) 20 MG tablet TAKE 1 TABLET (20 MG) BY MOUTH DAILY, TAKE 1 EXTRA TABLET BY MOUTH (20 MG) AS NEEDED FOR INCREASED SWELLING.   gabapentin  (NEURONTIN ) 300 MG capsule TAKE 2 CAPSULES BY MOUTH EVERY MORNING, 2 CAPSULES EACH AFTERNOON AND 3 CAPSULES AT BEDTIME   guaiFENesin  (MUCINEX ) 600 MG 12 hr tablet Take 600 mg by mouth 2 (two) times daily as needed (congestion).   MAGNESIUM  COMPLEX HIGH POTENCY PO Take 250 mg by mouth.   metoprolol  succinate (TOPROL -XL) 25 MG 24 hr tablet TAKE 1 TABLET (25 MG TOTAL) BY MOUTH DAILY.   Misc Natural Products (ADVANCED JOINT RELIEF) CAPS Take 1 capsule by mouth daily. Joint Advantage Gold 5x   Multiple Vitamin (MULTIVITAMIN WITH MINERALS) TABS tablet Take 1 tablet by mouth daily.   predniSONE  (DELTASONE ) 20 MG tablet AS NEEDED IF BRONCHITIS RECURS. TAKE 40 MG ONCE DAILY FOR 5 DAYS.   RESTASIS  0.05 % ophthalmic emulsion Place 1 drop into both  eyes 3 (three) times daily.   triamcinolone  cream (KENALOG ) 0.1 % Apply 1 Application topically daily as needed (irritation).   TURMERIC CURCUMIN PO Take by mouth.   umeclidinium-vilanterol (ANORO ELLIPTA ) 62.5-25 MCG/ACT AEPB Inhale 1 puff into the lungs daily.   vitamin C  (ASCORBIC ACID ) 500 MG tablet Take 1,000 mg by mouth daily.   VITAMIN E  PO Take 150 mg by mouth daily.   zolpidem  (AMBIEN ) 5 MG tablet TAKE 1 TABLET BY MOUTH AT BEDTIME AS NEEDED FOR SLEEP   [DISCONTINUED] calcium  carbonate (OS-CAL - DOSED IN MG OF ELEMENTAL CALCIUM ) 1250 (500 Ca) MG tablet Take 1 tablet by mouth every evening.   [DISCONTINUED] vitamin E  180 MG (400 UNITS) capsule Take 400 Units by mouth daily.   No facility-administered encounter medications on file as of 09/25/2023.  :   Review of Systems:  Out of a complete 14 point review of systems, all are reviewed and negative with the exception of these symptoms as listed below:  Review of Systems  Neurological:        Patient is here with her husband for referral for memory loss and daytime sleepiness. Patient states the memory loss bothers her husband more. He states it started around December 2022 when patient was diagnosed with heart failure. He states a brain MRI was completed May or June of 2023. Memory worsening is marginal per husband but he has seen  an increase in forgetfulness.     Objective:  Neurological Exam  Physical Exam Physical Examination:   Vitals:   09/25/23 0831  BP: 109/63  Pulse: 73    General Examination: The patient is a very pleasant 73 y.o. female in no acute distress. She appears frail.  Well-groomed.   HEENT: Normocephalic, atraumatic, pupils are equal, round and reactive to light, extraocular tracking is good without limitation to gaze excursion or nystagmus noted. Hearing is grossly intact on the right with hearing aids in place, she is hearing impaired on the left. Face is symmetric with normal facial animation and normal  facial sensation to light touch, temperature and vibration sense. Speech is clear with no dysarthria noted. There is no hypophonia. There is no lip, neck/head, jaw or voice tremor. Neck is supple with full range of passive and active motion. There are no carotid bruits on auscultation.   Chest: Clear to auscultation without wheezing, rhonchi or crackles noted.  Heart: S1+S2+0, regular and normal without murmurs, rubs or gallops noted.   Abdomen: Soft, non-tender and non-distended.  Extremities: There is no pitting edema in the distal lower extremities bilaterally.   Skin: Warm and dry with a rash on both forearms, right more than left, pinkish, slightly raised, had recent sun exposure and has a history of lupus.     Musculoskeletal: exam reveals no obvious joint deformities.   Neurologically:  Mental status: The patient is awake, alert and oriented in all 4 spheres. Her immediate and remote memory, attention, language skills and fund of knowledge are fairly appropriate.  She does not provide a detailed history.  Her husband provides additional history.  Mood is constricted and affect is blunted.  Cranial nerves II - XII are as described above under HEENT exam.  Motor exam: Normal bulk, strength and tone is noted.  No drift or rebound.  No intention tremor.  There is no obvious action or resting tremor.  Fine motor skills and coordination: grossly intact in the upper and lower extremities.  Cerebellar testing: No dysmetria or intention tremor. There is no truncal or gait ataxia.  Normal finger-to-nose, normal heel-to-shin bilaterally. Sensory exam: intact to light touch, temperature and vibration sense in the upper extremities, decreased temperature sense and vibration sense in the feet bilaterally.  Romberg is negative with the exception of slight initial sway. Reflexes 2+ throughout, toes are downgoing bilaterally. Tandem walk is challenging for her. Gait, station and balance: She stands  without major difficulty.  She walks without a walking aid, no shuffling, preserved arm swing.    Assessment and Plan:  In summary, KENLEI SAFI is a 73 year old female with an underlying complex medical history of atrial fibrillation, history of cardioversion, congestive heart failure, smoking, status post pacemaker placement, history of rheumatic fever, COPD, ischemic colitis, restless leg syndrome, lupus, obstructive sleep apnea (not currently on PAP therapy), cervical degenerative disc disease with status post ACDF in 2024, who presents for evaluation of her cognitive complaints.  She had a recent MMSE through your office, which was 27 out of 30, mildly abnormal, she has multiple vascular risk factors and we talked at length today.  This was a lengthy visit of over 60 minutes with copious record review involved and considerable counseling and coordination of care.  Below is a summary of my recommendations based on today's visit.  Neurological exam is nonfocal, she is largely reassured today.  She is encouraged to continue to work on strengthening exercises and stamina.  She is  advised to follow-up in this clinic as needed.  She had extensive workup in the recent past which we reviewed. <<  Continue to work on complete smoking cessation.   Continue to completely abstain from alcohol altogether.   Hydrate well with water  but avoid overhydration, talk to your cardiologist about this as your sodium level may drop and you also take Lasix .   Follow-up with all your specialists and continue to pursue treatment for sleep apnea as planned with your pulmonologist.   At this point in time, I do not believe we need for you to have a regular follow-up in this clinic.  I would be happy to see you back as needed. >>  Thank you very much for allowing me to participate in the care of this nice patient. If I can be of any further assistance to you please do not hesitate to call me at  [phone].  Sincerely,   Debbra Fairy, MD, PhD

## 2023-09-27 ENCOUNTER — Other Ambulatory Visit: Payer: Self-pay | Admitting: Nurse Practitioner

## 2023-09-27 DIAGNOSIS — J040 Acute laryngitis: Secondary | ICD-10-CM

## 2023-09-27 DIAGNOSIS — J3089 Other allergic rhinitis: Secondary | ICD-10-CM

## 2023-09-29 ENCOUNTER — Other Ambulatory Visit: Payer: Self-pay | Admitting: Pulmonary Disease

## 2023-09-29 DIAGNOSIS — J040 Acute laryngitis: Secondary | ICD-10-CM

## 2023-09-29 DIAGNOSIS — M810 Age-related osteoporosis without current pathological fracture: Secondary | ICD-10-CM | POA: Diagnosis not present

## 2023-09-29 DIAGNOSIS — Z1212 Encounter for screening for malignant neoplasm of rectum: Secondary | ICD-10-CM | POA: Diagnosis not present

## 2023-09-29 DIAGNOSIS — J3089 Other allergic rhinitis: Secondary | ICD-10-CM

## 2023-09-29 NOTE — Telephone Encounter (Signed)
 Copied from CRM [id number]. Topic: Clinical - Medication Refill >> Sep 29, 2023 12:36 PM Justina Oman C wrote: Most Recent Pulmonary Care Visit:   Provider: Dr. Orbie Binder Department: [facility] Date: 02/12/23  Medication(s): benzonatate  (TESSALON ) 200 MG capsule  Has the patient contacted their pharmacy? Yes, advised patient to contact the office.  (Agent: If no, request that the patient contact the pharmacy for the refill. If patient does not wish to contact the pharmacy document the reason why and proceed with request.) (Agent: If yes, when and what did the pharmacy advise?)  This is the patient's preferred pharmacy:  [facility] [phone] Jonette Nestle, [place] - [address] [place] [address] Phone: [phone] Fax: [phone]  Is this the correct pharmacy for this prescription? Yes If no, delete pharmacy and type the correct one.   Has the prescription been filled recently? No  Is the patient out of the medication? No, has one pill for tonight and tomorrow morning only.  Has the patient been seen for an appointment in the last year OR does the patient have an upcoming appointment? Yes  Can we respond through MyChart? Yes  Agent: Please be advised that Rx refills may take up to 3 business days. We ask that you follow-up with your pharmacy.

## 2023-09-30 ENCOUNTER — Ambulatory Visit: Admitting: Gastroenterology

## 2023-09-30 ENCOUNTER — Other Ambulatory Visit (INDEPENDENT_AMBULATORY_CARE_PROVIDER_SITE_OTHER)

## 2023-09-30 ENCOUNTER — Encounter: Payer: Self-pay | Admitting: Gastroenterology

## 2023-09-30 VITALS — BP 122/64 | HR 68 | Ht 62.0 in | Wt 123.0 lb

## 2023-09-30 DIAGNOSIS — R7989 Other specified abnormal findings of blood chemistry: Secondary | ICD-10-CM | POA: Diagnosis not present

## 2023-09-30 NOTE — Progress Notes (Addendum)
 09/30/2023 Sheila Morales [id number] November 02, 1950   HISTORY OF PRESENT ILLNESS: This is a 73 year old female who is a patient of Dr. Jadene Maxwell.  She is here today for evaluation of elevated liver enzymes.  They were actually very elevated back in December 2023/February 2024 reaching an AST of 200 and ALT of 299.  Alk phos and total bili have remained normal.  Most recent labs on 08/26/2023 show an AST of 40, ALT 33, alk phos 69, total bili 0.5.  She had an ultrasound in February 2024 that showed mildly heterogeneous and mildly increased parenchymal echogenicity that are nonspecific may reflect hepatic steatosis.  She was on methotrexate  for several years.  Has lupus.  It looks like she was still on it in April 2024, she and her husband are unaware of when it was stopped exactly, but my assumption would be sometime shortly after that.  The only other medication that she was on, which has also been discontinued is hydroxychloroquine .  Past Medical History:  Diagnosis Date   A-fib [facility])    Acute respiratory failure with hypoxia (HCC) 11/05/2017   Arthritis    Cervicalgia    CHF (congestive heart failure) (HCC)    Colitis, ischemic (HCC)    COPD (chronic obstructive pulmonary disease) (HCC)    External hemorrhoids    H/O: rheumatic fever    Heart disease    IBS (irritable bowel syndrome)    Lupus (systemic lupus erythematosus) (HCC)    NAFLD (nonalcoholic fatty liver disease)    Osteoporosis    Pacemaker    Personal history colonic adenoma 03/25/2008   06/2007 right sided adenoma and 2 right hyperplastic polyps     Restless leg syndrome    questionable   Sleep apnea    Systemic lupus erythematosus (HCC)    Tubular adenoma of colon    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 OR;  Service: Neurosurgery;  Laterality: N/A;   CARDIOVERSION N/A 04/08/2023   Procedure: CARDIOVERSION;  Surgeon: Euell Herrlich, MD;  Location: [facility] INVASIVE CV LAB;  Service: Cardiovascular;  Laterality: N/A;   CHOLECYSTECTOMY     CHOLECYSTECTOMY, LAPAROSCOPIC     COLONOSCOPY     ESOPHAGOGASTRODUODENOSCOPY     lesion, vulva excision     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   Procedure: PACEMAKER IMPLANT;  Surgeon: Tammie Fall, MD;  Location: [facility] INVASIVE CV LAB;  Service: Cardiovascular;  Laterality: N/A;   TONSILLECTOMY     TRANSESOPHAGEAL ECHOCARDIOGRAM (CATH LAB) N/A 04/08/2023   Procedure: TRANSESOPHAGEAL ECHOCARDIOGRAM;  Surgeon: Euell Herrlich, MD;  Location: [facility] INVASIVE CV LAB;  Service: Cardiovascular;  Laterality: N/A;   TUBAL LIGATION  1977   VIDEO BRONCHOSCOPY Bilateral 11/06/2017   Procedure: VIDEO BRONCHOSCOPY WITHOUT FLUORO;  Surgeon: Mannam, Praveen, MD;  Location: [facility] ENDOSCOPY;  Service: Cardiopulmonary;  Laterality: Bilateral;    reports that she has been smoking cigarettes. She started smoking about 33 years ago. She has a 7.5 pack-year smoking history. She has never used smokeless tobacco. She reports that she does not currently use alcohol after a past usage of about 5.0 standard drinks of alcohol per week. She reports that she does not use drugs. family history includes Atrial fibrillation in her father; Colon cancer in her father;  Dementia in her father and sister; Healthy in her daughter; Other in her sister and another family member; Stroke in her mother; Vascular Disease in an other family member. No Known Allergies    Outpatient Encounter Medications as of 09/30/2023  Medication Sig   acetaminophen  (TYLENOL ) 325 MG tablet Take 650 mg by mouth every 6 (six) hours as needed for moderate pain or mild pain.   albuterol  (VENTOLIN  HFA) 108 (90 Base) MCG/ACT inhaler Inhale 1-2 puffs into the lungs every 6 (six) hours as needed  for wheezing or shortness of breath.   apixaban  (ELIQUIS ) 5 MG TABS tablet Take 1 tablet (5 mg total) by mouth 2 (two) times daily.   Bacillus Coagulans-Inulin (PROBIOTIC-PREBIOTIC PO) Take by mouth. Bio Complete   benzonatate  (TESSALON ) 200 MG capsule TAKE 1 CAPSULE (200 MG TOTAL) BY MOUTH EVERY 6 (SIX) HOURS AS NEEDED FOR COUGH.   CALCIUM  PO Take 600 mg by mouth.   Cholecalciferol  (VITAMIN D ) 50 MCG (2000 UT) tablet Take 4,000 Units by mouth every evening.   DM-APAP-CPM (CORICIDIN HBP PO) Take 5 mLs by mouth every 6 (six) hours as needed (congestion).   empagliflozin  (JARDIANCE ) 10 MG TABS tablet TAKE 1 TABLET BY MOUTH DAILY BEFORE BREAKFAST.   fluticasone  (FLONASE ) 50 MCG/ACT nasal spray SPRAY 2 SPRAYS INTO EACH NOSTRIL EVERY DAY (Patient taking differently: Place 2 sprays into both nostrils daily as needed for allergies.)   furosemide  (LASIX ) 20 MG tablet TAKE 1 TABLET (20 MG) BY MOUTH DAILY, TAKE 1 EXTRA TABLET BY MOUTH (20 MG) AS NEEDED FOR INCREASED SWELLING.   gabapentin  (NEURONTIN ) 300 MG capsule TAKE 2 CAPSULES BY MOUTH EVERY MORNING, 2 CAPSULES EACH AFTERNOON AND 3 CAPSULES AT BEDTIME   guaiFENesin  (MUCINEX ) 600 MG 12 hr tablet Take 600 mg by mouth 2 (two) times daily as needed (congestion).   MAGNESIUM  COMPLEX HIGH POTENCY PO Take 250 mg by mouth.   metoprolol  succinate (TOPROL -XL) 25 MG 24 hr tablet TAKE 1 TABLET (25 MG TOTAL) BY MOUTH DAILY.   Misc Natural Products (ADVANCED JOINT RELIEF) CAPS Take 1 capsule by mouth daily. Joint Advantage Gold 5x   Multiple Vitamin (MULTIVITAMIN WITH MINERALS) TABS tablet Take 1 tablet by mouth daily.   predniSONE  (DELTASONE ) 20 MG tablet AS NEEDED IF BRONCHITIS RECURS. TAKE 40 MG ONCE DAILY FOR 5 DAYS.   RESTASIS  0.05 % ophthalmic emulsion Place 1 drop into both eyes 3 (three) times daily.   triamcinolone  cream (KENALOG ) 0.1 % Apply 1 Application topically daily as needed (irritation).   TURMERIC CURCUMIN PO Take by mouth.    umeclidinium-vilanterol (ANORO ELLIPTA ) 62.5-25 MCG/ACT AEPB Inhale 1 puff into the lungs daily.   vitamin C  (ASCORBIC ACID ) 500 MG tablet Take 1,000 mg by mouth daily.   VITAMIN E  PO Take 150 mg by mouth daily.   zolpidem  (AMBIEN ) 5 MG tablet TAKE 1 TABLET BY MOUTH AT BEDTIME AS NEEDED FOR SLEEP   No facility-administered encounter medications on file as of 09/30/2023.    REVIEW OF SYSTEMS  : All other systems reviewed and negative except where noted in the History of Present Illness.   PHYSICAL EXAM: BP 122/64 (BP Location: Right Arm, Patient Position: Sitting, Cuff Size: Normal)   Pulse 68   Ht 5' 2 (1.575 m)   Wt 123 lb (55.8 kg)   BMI 22.50 kg/m  General: Well developed white female in no acute distress Head: Normocephalic and atraumatic Eyes:  Sclerae anicteric, conjunctiva pink. Ears: Normal auditory acuity Lungs: Clear throughout to auscultation;  no W/R/R. Heart: Regular rate and rhythm; no M/R/G. Musculoskeletal: Symmetrical with no gross deformities  Skin: No lesions on visible extremities Extremities: No edema  Neurological: Alert oriented x 4, grossly non-focal Psychological:  Alert and cooperative. Normal mood and affect  ASSESSMENT AND PLAN: *Elevated LFTs: LFTs elevated quite significantly in December 2023 and February 2024.  Since then they have down trended.  It appears that she was on methotrexate , even up until April 2024, probably discontinued sometime around then, but they are not sure exactly when.  I suspect that that was the cause of her elevation, she had been on that medication for several years.  Ultrasound shows some hepatic steatosis.  She does have lupus.  I am going to run the other labs to rule out causes of chronic liver disease, especially autoimmune hepatitis, etc.  She does have COPD so I am going to check an alpha-1 antitrypsin as well as viral studies, etc.  Otherwise we discussed good diet, exercise, weight loss, good blood sugar cholesterol  control, etc.   CC:  Imelda Man, MD  Primary gastroenterologist:   Lab Results  Component Value Date   ALT 30 09/30/2023   AST 38 (H) 09/30/2023   ALKPHOS 58 09/30/2023   BILITOT 0.6 09/30/2023   She has a chronic abnormal ANA probably from her lupus. Her INR was 1.6 but she is on Eliquis  so that could do that so that level is not reliable. Other workup for hepatitis B, C, celiac negative.  Her IgG is elevated slightly at 1588 with top normal 1540.  IgA is also elevated I am not sure what that means in this context.  Iron saturation is 24.6% with a ferritin of 199.8.  She had platelets of 165 in May.   Fib 4 calculation is 3.07 points which indicates possible advanced fibrosis.  I will have the team order hepatic elastography.  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  So I do not think a liver biopsy is indicated at this point.  Sheila Peacemaker, MD, [facility]

## 2023-09-30 NOTE — Patient Instructions (Signed)
 Your provider has requested that you go to the basement level for lab work before leaving today. Press "B" on the elevator. The lab is located at the first door on the left as you exit the elevator.  _______________________________________________________  If your blood pressure at your visit was 140/90 or greater, please contact your primary care physician to follow up on this.  _______________________________________________________  If you are age 74 or older, your body mass index should be between 23-30. Your Body mass index is 22.5 kg/m. If this is out of the aforementioned range listed, please consider follow up with your Primary Care Provider.  If you are age 43 or younger, your body mass index should be between 19-25. Your Body mass index is 22.5 kg/m. If this is out of the aformentioned range listed, please consider follow up with your Primary Care Provider.   ________________________________________________________  The [facility] providers would like to encourage you to use MYCHART to communicate with providers for non-urgent requests or questions.  Due to long hold times on the telephone, sending your provider a message by [facility] may be a faster and more efficient way to get a response.  Please allow 48 business hours for a response.  Please remember that this is for non-urgent requests.  _______________________________________________________

## 2023-10-01 ENCOUNTER — Ambulatory Visit: Payer: Self-pay | Admitting: Internal Medicine

## 2023-10-01 DIAGNOSIS — J3089 Other allergic rhinitis: Secondary | ICD-10-CM

## 2023-10-01 DIAGNOSIS — J040 Acute laryngitis: Secondary | ICD-10-CM

## 2023-10-01 NOTE — Telephone Encounter (Signed)
 FYI Only or Action Required?: Action required by provider  Patient was last seen on 02/12/2023 by Hunsucker, Archer Kobs, MD. Called Nurse Triage reporting Cough. Symptoms began a week ago.  Symptoms are: unchanged.  Triage Disposition: See Physician Within 24 Hours  Patient/caregiver understands and will follow disposition?: No, wishes to speak with PCP            Copied from CRM [id number]. Topic: Clinical - Prescription Issue >> Oct 01, 2023 12:57 PM Alverda Joe S wrote: Reason for CRM: patient is calling for benzonatate  (TESSALON ) 200 MG capsule, she requested a prescription refill based off her current symptoms, but it looks like it was refused, please call patient to further investigate and ask questions. Reason for Disposition  SEVERE coughing spells (e.g., whooping sound after coughing, vomiting after coughing)  Answer Assessment - Initial Assessment Questions Patient is calling for Tessalon  refill  [facility] [address]  Patient is under impression all she needs to do is called and it will be refilled Dry cough x1 week Congestion Denies difficulty breathing, wheezing, chest pain  Protocols used: Cough - Acute Non-Productive-A-AH

## 2023-10-02 ENCOUNTER — Encounter: Payer: Self-pay | Admitting: Pulmonary Disease

## 2023-10-03 ENCOUNTER — Ambulatory Visit: Payer: PPO

## 2023-10-06 ENCOUNTER — Other Ambulatory Visit: Payer: Self-pay | Admitting: Pulmonary Disease

## 2023-10-06 DIAGNOSIS — J040 Acute laryngitis: Secondary | ICD-10-CM

## 2023-10-06 DIAGNOSIS — J3089 Other allergic rhinitis: Secondary | ICD-10-CM

## 2023-10-06 MED ORDER — BENZONATATE 100 MG PO CAPS: 100.0000 mg | ORAL_CAPSULE | Freq: Four times a day (QID) | ORAL | 1 refills | Status: DC | PRN

## 2023-10-06 NOTE — Addendum Note (Signed)
 Addended byOrbie Binder on: 10/06/2023 09:10 AM   Modules accepted: Orders

## 2023-10-06 NOTE — Telephone Encounter (Signed)
Tessalon refilled °

## 2023-10-06 NOTE — Telephone Encounter (Signed)
 ATC patient- unable to leave vm due to mailbox not been setup.

## 2023-10-07 LAB — TISSUE TRANSGLUTAMINASE ABS,IGG,IGA
(tTG) Ab, IgA: <1 U/mL
(tTG) Ab, IgG: <1 U/mL

## 2023-10-07 LAB — HEPATITIS B SURFACE ANTIGEN: Hepatitis B Surface Ag: NONREACTIVE

## 2023-10-07 LAB — HEPATITIS C ANTIBODY: Hepatitis C Ab: NONREACTIVE

## 2023-10-07 LAB — ANTI-SMOOTH MUSCLE ANTIBODY, IGG: Actin (Smooth Muscle) Antibody (IGG): <20 U (ref <20)

## 2023-10-07 LAB — IGA: Immunoglobulin A: 443 mg/dL — ABNORMAL HIGH (ref 70–320)

## 2023-10-07 LAB — MITOCHONDRIAL ANTIBODIES: Mitochondrial M2 Ab, IgG: <=20 U (ref <=20.0)

## 2023-10-07 LAB — ANTI-NUCLEAR AB-TITER (ANA TITER): ANA Titer 1: 1:320 {titer} — ABNORMAL HIGH

## 2023-10-07 LAB — HEPATITIS B SURFACE ANTIBODY,QUALITATIVE: Hep B S Ab: NONREACTIVE

## 2023-10-07 LAB — IGG: IgG (Immunoglobin G), Serum: 1588 mg/dL — ABNORMAL HIGH (ref 600–1540)

## 2023-10-07 LAB — ALPHA-1-ANTITRYPSIN: A-1 Antitrypsin, Ser: 162 mg/dL (ref 83–199)

## 2023-10-07 LAB — ANA: Anti Nuclear Antibody (ANA): POSITIVE — AB

## 2023-10-07 NOTE — Telephone Encounter (Signed)
Lm x2 for pt. Will close encounter per office protocol.

## 2023-10-08 ENCOUNTER — Ambulatory Visit: Payer: Self-pay | Admitting: Gastroenterology

## 2023-10-09 ENCOUNTER — Ambulatory Visit (HOSPITAL_BASED_OUTPATIENT_CLINIC_OR_DEPARTMENT_OTHER): Attending: Pulmonary Disease | Admitting: Internal Medicine

## 2023-10-09 DIAGNOSIS — R0683 Snoring: Secondary | ICD-10-CM | POA: Diagnosis not present

## 2023-10-09 DIAGNOSIS — G4733 Obstructive sleep apnea (adult) (pediatric): Secondary | ICD-10-CM | POA: Insufficient documentation

## 2023-10-10 NOTE — Progress Notes (Signed)
 Remote pacemaker transmission.

## 2023-10-13 ENCOUNTER — Other Ambulatory Visit: Payer: Self-pay

## 2023-10-13 MED ORDER — FUROSEMIDE 20 MG PO TABS: ORAL_TABLET | ORAL | 2 refills | Status: AC

## 2023-10-19 NOTE — Procedures (Signed)
 [facility] [address] [place], [place] [address] Tel: [phone]   Fax: [phone]  Split Night Interpretation  Patient Name:  Sheila Morales, Sheila Morales Date:  10/09/2023 Referring Physician:  Clorinda Beals MD  Indications for Polysomnography The patient is a 73 year old Female who is 5' 2 and weighs 123.0 lbs.  Her BMI equals 22.6.  A diagnostic polysomnogram was performed to evaluate for -.OSA After 138.0 minutes of sleep time the patient exhibited sufficient respiratory events qualifying her for a CPAP trial which was then initiated.    Medication  No Data.   Polysomnogram Data A full night polysomnogram was performed recording the standard physiologic parameters including EEG, EOG, EMG, EKG, nasal and oral airflow.  Respiratory parameters of chest and abdominal movements are recorded with Peizo-Crystal motion transducers.  Oxygen  saturation was recorded by pulse oximetry.    Sleep Architecture The total recording time of the diagnostic portion of the study was 163.7 minutes.  The total sleep time was 138.0 minutes.  During the diagnostic portion of the study, the patient spent 1.8% of total sleep time in Stage N1, 98.2% in Stage N2, 0.0% in Stages N3, and 0.0% in REM.   Sleep latency was 4.7 minutes.  REM latency was - minutes.  Sleep Efficiency was 84.3%.  Wake after Sleep Onset time was 21.0 minutes.   At 12:58:11 AM the patient was placed on PAP treatment and was titrated at pressures ranging from 5* cm/H20 with supplemental oxygen  at - up to 13* cm/H20 with supplemental oxygen  at -.  The total recording time of the treatment portion of the study was 226.1 minutes.  The total sleep time was 222.5 minutes.  During the treatment portion of the study, the patient spent 1.1% of total sleep time in Stage N1, 92.8% in Stage N2, 6.1% in Stages N3, and 0.0% in REM.   Sleep latency was 0.0 minutes.  REM latency was - minutes.  Sleep Efficiency was  98.4%.  Wake after Sleep Onset time was 3.5 minutes.  Respiratory Events During the diagnostic portion of the study, the polysomnogram revealed a presence of - obstructive, - central, and - mixed apneas resulting in an Apnea index of - events per hour.  There were 54 hypopneas (>=3% desaturation and/or arousal) resulting in an Apnea\Hypopnea Index (AHI >=3% desaturation and/or arousal) of 23.5 events per hour.  There were 34 hypopneas (>=4% desaturation) resulting in an Apnea\Hypopnea Index (AHI >=4% desaturation) of 14.8 events per hour.  There were - Respiratory Effort Related Arousals resulting in a RERA index of - events per hour. The Respiratory Disturbance Index is 23.5 events per hour.  The snore index was - events per hour.  Mean oxygen  saturation was 89.9%.  The lowest oxygen  saturation during sleep was 84.0%.  Time spent <=88% oxygen  saturation was 34.4 minutes (21.8%).  During the treatment portion of the study, the polysomnogram revealed a presence of 1 obstructive, - central, and - mixed apneas resulting in an Apnea index of 0.3 events per hour.  There were 18 hypopneas (>=3% desaturation and/or arousal) resulting in an Apnea\Hypopnea Index (AHI >=3% desaturation and/or arousal) of 5.1 events per hour.  There were 4 hypopneas (>=4% desaturation) resulting in an Apnea\Hypopnea Index (AHI >=4% desaturation) of 1.3 events per hour.  There were - Respiratory Effort Related Arousals resulting in a RERA index of - events per hour. The Respiratory Disturbance Index is 5.1 events per hour.  The snore index was -  events per hour.  Mean oxygen  saturation was 91.3%.  The lowest oxygen  saturation during sleep was 87.0%.  Time spent <=88% oxygen  saturation was 1.4 minutes (0.6%).  Limb Activity During the diagnostic portion of the study, there were - limb movements recorded.  Of this total, - were classified as PLMs.  Of the PLMs, - were associated with arousals.  The Limb Movement index was - per hour while  the PLM index was - per hour.  During the treatment portion of the study, there were - limb movements recorded.  Of this total, - were classified as PLMs.  Of the PLMs, - were associated with arousals.  The Limb Movement index was - per hour while the PLM index was - per hour.  Cardiac Summary During the diagnostic portion of the study, the average pulse rate was 64.6 bpm.  The minimum pulse rate was 60.0 bpm while the maximum pulse rate was 83.0 bpm. Rhythm was paced.  During the treatment portion of the study, the average pulse rate was 64.4 bpm.  The minimum pulse rate was 60.0 bpm while the maximum pulse rate was 80.0 bpm.   Comment: Mild to moderate obstructive sleep apnea, AHI (4%) 14.8/hr. Snoring with oxygen  desaturation to a nadir of 84%, mean 89.9%. Ti, indicating nocturnal hypoxemia.me with O2 saturation 88% or less was 34.4 minutes, indicating nocturnal hypoxemia. CPAP was titrated to 13 cwp with residual AHI (4%) 0/hr, minimum O2 saturation 91%, mean 91.8%.  Diagnosis: Obstructive sleep apnea  Recommendations: Suggest autopap range 5-15, CPAP titration sleep study, or fitted oral appliance.   This study was personally reviewed and electronically signed by: Reggy Salt MD Accredited Board Certified in Sleep Medicine Date/Time: 10/19/23 12:26   Split Night Report  Patient Name: Sheila Morales, Sheila Morales Date: 10/09/2023  Date of Birth: 14-Apr-1951 Study Type: Split Night  Age: 60 year MRN #: [id number]  Sex: Female Interpreting Physician: SALT REGGY, [id number]  Height: 5' 2 Referring Physician: Clorinda Beals MD  Weight: 123.0 lbs Recording Tech: Prentice Coombe RPSGT RST  BMI: 22.6 Scoring Tech: Prentice Coombe RPSGT RST  ESS: 8 Neck Size: 14  Mask Type Vitera Full Face Mask Final Pressure: 13cmH20  Mask Size: Medium Supplemental O2: N/A   Study Overview  DIAGNOSTIC TREATMENT  Lights Off: 10:14:04 PM Lights Off: 12:57:43 AM  Lights On: 12:57:43 AM Lights On: 04:43:48 AM  Time in  Bed: 163.7 min. Time in Bed: 226.1 min.  Total Sleep Time: 138.0 min. Total Sleep Time: 222.5 min.  Sleep Efficiency: 84.3% Sleep Efficiency: 98.4%  Sleep Latency: 4.7 min. Sleep Latency: 0.0 min.  REM Latency from Sleep Onset: - min. REM Latency from Sleep Onset: - min.  Wake After Sleep Onset: 21.0 min. Wake After Sleep Onset: 3.5 min.   DIAGNOSTIC TREATMENT   Count Index  Count Index  Awakenings: 3 1.3 Awakenings: 6 1.6  Arousals: 6 2.6 Arousals: 27 7.3  AHI (>=3% Desat and/or Ar.): 54 23.5 AHI (>=3% Desat and/or Ar.): 19 5.1  AHI (>=4% Desat): 34 14.8 AHI (>=4% Desat): 5 1.3   Limb Movements: - - Limb Movements: - -  Snore: - - Snore: - -  Desaturations: 54 23.5 Desaturations: 20 5.4  Minimum SpO2 TST: 84.0% Minimum SpO2 TST: 87.0%    Sleep Architecture   DIAGNOSTIC TREATMENT ENTIRE NIGHT  Stages Time (mins) % Sleep Time Time (mins) % Sleep Time Time (mins) % Sleep Time  Wake 26.0  4.0  30.0   Stage N1 2.5 1.8% 2.5  1.1% 5.0 1.4%  Stage N2 135.5 98.2% 206.5 92.8% 342.0 94.9%  Stage N3 0.0 0.0% 13.5 6.1% 13.5 3.7%  REM 0.0 0.0% 0.0 0.0% 0.0 0.0%   Arousal Summary   DIAGNOSTIC TREATMENT   NREM REM TST Index NREM REM TST Index  Respiratory Ar. 1 - 1 0.4 - - - -  PLM Ar. - - - - - - - -  Isolated Limb Movement Ar. - - - - - - - -  Snore Ar. - - - - - - - -  Spontaneous Ar. 5 - 5 2.2 27 - 27 7.3  Total Ar. 6 - 6 2.6 27 - 27 7.3    Respiratory Summary  DIAGNOSTIC By Sleep Stage By Body Position Total   NREM REM Supine Non-Supine   Time (min) 138.0 0.0 15.0 123.0 138.0         Obstructive Apnea - - - - -  Mixed Apnea - - - - -  Central Apnea - - - - -  Total Apneas - - - - -  Total Apnea Index - - - - -         Hypopneas (>=3% Desat and/or Ar.) 54 - 18 36 54  AHI (>=3% Desat and/or Ar.) 23.5 - 72.0 17.6 23.5         Hypopneas (>=4% Desat) 34 - 9 25 34  AHI (>=4% Desat) 14.8 - 36.0 12.2 14.8          RERAs - - - - -  RERA Index - - - - -         RDI 23.5 - 72.0  17.6 23.5    TREATMENT By Sleep Stage By Body Position Total   NREM REM Supine Non-Supine   Time (min) 222.5 0.0 28.5 194.0 222.5         Obstructive Apnea 1 - - 1 1  Mixed Apnea - - - - -  Central Apnea - - - - -  Total Apneas 1 - - 1 1  Total Apnea Index 0.3 - - 0.3 0.3         Hypopneas (>=3% Desat and/or Ar.) 18 - 3 15 18   AHI (>=3% Desat and/or Ar.) 5.1 - 6.3 4.9 5.1         Hypopneas (>=4% Desat) 4 - - 4 4  AHI (>=4% Desat) 1.3 - - 1.5 1.3          RERAs - - - - -  RERA Index - - - - -         RDI 5.1 - 6.3 4.9 5.1    Respiratory Event Durations   DIAGNOSTIC TREATMENT  Apnea NREM REM NREM REM  Average (seconds) - - 13.3 -  Maximum (seconds) - - 13.3 -  Hypopnea      Average (seconds) 34.5 - 36.8 -  Maximum (seconds) 64.2 - 49.2 -    Limb Movement Summary   DIAGNOSTIC TREATMENT   Count Index Count Index  Isolated Limb Movements - - - -  Periodic Limb Movements (PLMs) - - - -  Total Limb Movements - - - -    Oxygen  Saturation Summary   DIAGNOSTIC TREATMENT   Wake NREM REM TST Wake NREM REM TST  Average SpO2 91.3% 89.7% - 89.7% 92.1% 91.3% - 91.3%  Minimum SpO2 86.0% 84.0% - 84.0%  90.0% 87.0% - 87.0%   Maximum SpO2 95.0% 94.0% - 94.0%  95.0% 95.0% - 95.0%    DIAGNOSTIC  Oxygen  Saturation Distribution  Range (%) Time in range (min) Time in range (%)   90.0 - 100.0 58.3 36.9%  80.0 - 90.0 99.6 63.1%  70.0 - 80.0 - -  60.0 - 70.0 - -  50.0 - 60.0 - -  0.0 - 50.0 - -  Time Spent <=88% SpO2  Range (%) Time in range (min) Time in range (%)  0.0 - 88.0 34.4 21.8%      Count Index  Desaturations: 54 23.5   TREATMENT Oxygen  Saturation Distribution  Range (%) Time in range (min) Time in range (%)   90.0 - 100.0 189.1 83.6%  80.0 - 90.0 37.1 16.4%  70.0 - 80.0 - -  60.0 - 70.0 - -  50.0 - 60.0 - -  0.0 - 50.0 - -  Time Spent <=88% SpO2  Range (%) Time in range (min) Time in range (%)  0.0 - 88.0 1.4 0.6%      Count Index  Desaturations:  20 5.4     Cardiac Summary   DIAGNOSTIC TREATMENT   Wake NREM REM Total Wake NREM REM Total  Average Pulse Rate (BPM) 68.0 64.1 - 64.6 66.2 64.4 - 64.4  Minimum Pulse Rate (BPM) 60.0 60.0 - 60.0 62.0 60.0 - 60.0  Maximum Pulse Rate (BPM) 83.0 75.0 - 83.0 74.0 80.0 - 80.0   Pulse Rate Distribution   DIAGNOSTIC  Range (bpm) Time in range (min) Time in range (%)  0.0 - 40.0 - -  40.0 - 60.0 1.9 1.2%  60.0 - 80.0 154.9 98.1%  80.0 - 100.0 1.1 0.7%  100.0 - 120.0 - -  120.0 - 140.0 - -  140.0 - 200.0 - -   TREATMENT  Range (bpm) Time in range (min) Time in range (%)  0.0 - 40.0 - -  40.0 - 60.0 3.2 1.4%  60.0 - 80.0 222.7 98.6%  80.0 - 100.0 - -  100.0 - 120.0 - -  120.0 - 140.0 - -  140.0 - 200.0 - -    Titration Summary  PAP Device PAP Level O2 Level Time (min) Wake (min) NREM (min) REM (min) Sleep Eff% OA# CA# MA# Hyp# (>=3%) AHI (>=3%) Hyp# (>=4%) AHI (>=%4) RERA RDI OSat <=88% (min) Min [employer] Ar. Index  - Off - 164.0 26.0 138.0 0.0 84.1% - - - 54 23.5 34  14.8 -  23.5  33.1 84.0 89.7 2.6  CPAP 5 - 14.5 0.5 14.0 0.0 96.6% - - - 1 4.3 -  - -  4.3  0.0 89.0 90.9 17.1  CPAP 7 - 53.5 0.0 53.5 0.0 100.0% - - - 7 7.9 3  3.4 -  7.9  0.9 87.0 91.0 15.7  CPAP 9 - 98.5 0.5 98.0 0.0 99.5% 1 - - 6 4.3 1  1.2 -  4.3  0.5 87.0 91.4 4.9  CPAP 11 - 56.5 2.0 54.5 0.0 96.5% - - - 4 4.4 -  - -  4.4  0.0 89.0 91.5 1.1  CPAP 13 - 3.5 1.0 2.5 0.0 71.4% - - - - - -  - -  -  0.0 91.0 91.8 -    Hypnograms                           Technologist Comments  STUDY TYPE= SPLIT STUDY MASK= VITERA FULL FACE MASK PRESSURE=13CMH20 SIZE= MEDIUM Patient Present here for Split Night study. Hook up was done according to  Policy. Patient was cooperative. Patient had difficulty falling asleep. Patient Produced Moderate apneas  as the study Progressed. Split was initiated, Pressure was started on 5cmH20.  Patient slept left, right and supine. Bathroom break was taking  one time.  Optimal Pressure was reached at 13cmH20. Patient tolerated the mask Patient tossed and turned all night. No significant PLMs. ECG was pacemaker Generated. Vitera Full Face Mask was used for this study.                            Reggy Salt Diplomate, Biomedical engineer of Sleep Medicine  ELECTRONICALLY SIGNED ON:  10/19/2023, 12:17 PM [facility] PH: [phone]   FX: [phone] ACCREDITED BY THE [employer]ICAN ACADEMY OF SLEEP MEDICINE

## 2023-10-22 NOTE — Progress Notes (Deleted)
 Cardiology Office Note:  .   Date:  10/22/2023  ID:  Sheila Morales, DOB 1950/10/04, MRN [id number] PCP: Clarice Nottingham, MD  [facility] Providers Cardiologist:  Soyla DELENA Merck, MD Electrophysiologist:  Danelle Birmingham, MD {  History of Present Illness: .   Sheila Morales is a 73 y.o. 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 Coli bacteremia.  During that admission she was found to be in new onset atrial fibrillation and acute HFpEF exacerbation requiring BiPAP and ICU admission.  admitted in January 2023 with fever/confusion/lethargy. During that admission she was noted to be anemic to 7.3 (on eliquis ). 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 Birmingham 05/10/22, back to work, doing OK, some swelling reported and DOE with stairs Discussed amio for AFib had been stopped (but was on the list)  Following with Drs Merck and [place]  Seeing Dr. Gardenia kussmaul 08/12/22, feeling better, better exertional capacity Doing great on Jardiacne, volume stable No changes mad Maintained on amiodarone  and Eliquis   .....  TEE/DCCV 04/08/23  Saw Dr. Merck on 05/05/23, maintaining SR Volume better off plaquenil  Inconsistent CPAP use, but improving  Today's visit is scheduled as an annual device visit Remotes with several AT episodes, mostly seconds , to give some consideration to adjusting AHR detection rates ROS:   *** amiodarone ?  Labs *** burden *** device only *** meds, ? More nodal blocker?  Device information Biotronik dual chamber PPM implanted 05/26/20  Arrhythmia/AAD hx Afib found during hospitalization w/Ecoli bacteremia, Nov 2022 > amiodarone   Studies Reviewed: SABRA    EKG not done today  DEVICE interrogation done today and reviewed by myself ***  Battery and lead measurements are good ***  04/08/23: TEE/DCCV 1. Left ventricular ejection fraction, by estimation, is 60 to 65%. The  left ventricle has normal function.   2. Right ventricular systolic function mild-moderately reduced. The right  ventricular size is normal.   3. Left atrial size was severely dilated. No left atrial/left atrial  appendage thrombus was detected.   4. Right atrial size was severely dilated.   5. The mitral valve is degenerative. Moderate mitral valve regurgitation.  No evidence of mitral stenosis.   6. Tricuspid valve regurgitation is moderate.   7. The aortic valve is tricuspid. There is mild calcification of the  aortic valve. Aortic valve regurgitation is not visualized.   8. There is mild (Grade II) plaque involving the aortic arch and  descending aorta.   9. Evidence of atrial level shunting detected by color flow Doppler.  Agitated saline contrast bubble study was negative, with no evidence of  any interatrial shunt. There is a small patent foramen ovale with  predominantly left to right shunting across  the atrial septum.   Conclusion(s)/Recommendation(s): No LA/LAA thrombus identified. Successful  cardioversion performed with restoration of normal sinus rhythm.     ECHO: 03/22/21: LVEF 60-65%, LVH; Grade III diastolic dysfunction; normal RV function as per my read.  01/10/22: LVEF 65%, normal RV function.    CMR:  09/26/2021: 1. Normal biventricular chamber size and function, LVEF 57%, RVEF 58%.  2. There is post contrast delayed myocardial enhancement. Midmyocardial circumferential stripe of delayed enhancement at the base. Midmyocardial delayed enhancement in the lateral wall at the mid ventricle.  Findings are nonspecific. Overall, findings may represent hypertensive heart disease. However, patient is chronically on plaquenil  and this constellation of findings in combination with clinical presentation does not exclude  plaquenil  cardiomyopathy.   Risk Assessment/Calculations:    Physical Exam:   VS:  There were no vitals taken for this visit.   Wt Readings from Last 3 Encounters:  10/09/23 123 lb (55.8 kg)  09/30/23 123 lb (55.8 kg)  09/25/23 125 lb (56.7 kg)    GEN: Well nourished, well developed in no acute distress NECK: No JVD; No carotid bruits CARDIAC: ***RRR, no murmurs, rubs, gallops RESPIRATORY:  *** CTA b/l without rales, wheezing or rhonchi  ABDOMEN: Soft, non-tender, non-distended EXTREMITIES: *** No edema; No deformity   PPM site: *** is stable, no thinning, fluctuation, tethering  ASSESSMENT AND PLAN: .    PPM *** intact function ***   ATach *** burden ***  HTN ***     {Are you ordering a CV Procedure (e.g. stress test, cath, DCCV, TEE, etc)?   Press F2        :[id number]}     Dispo: ***  Signed, Charlies Macario Arthur, PA-C

## 2023-10-23 ENCOUNTER — Emergency Department (HOSPITAL_BASED_OUTPATIENT_CLINIC_OR_DEPARTMENT_OTHER)

## 2023-10-23 ENCOUNTER — Other Ambulatory Visit: Payer: Self-pay

## 2023-10-23 ENCOUNTER — Observation Stay (HOSPITAL_BASED_OUTPATIENT_CLINIC_OR_DEPARTMENT_OTHER)
Admission: EM | Admit: 2023-10-23 | Discharge: 2023-10-25 | Disposition: A | Discharge status: Home / Self Care | Attending: Internal Medicine | Admitting: Internal Medicine

## 2023-10-23 DIAGNOSIS — N1831 Chronic kidney disease, stage 3a: Secondary | ICD-10-CM | POA: Diagnosis not present

## 2023-10-23 DIAGNOSIS — I480 Paroxysmal atrial fibrillation: Secondary | ICD-10-CM | POA: Diagnosis present

## 2023-10-23 DIAGNOSIS — G629 Polyneuropathy, unspecified: Secondary | ICD-10-CM | POA: Diagnosis not present

## 2023-10-23 DIAGNOSIS — J849 Interstitial pulmonary disease, unspecified: Secondary | ICD-10-CM | POA: Diagnosis present

## 2023-10-23 DIAGNOSIS — R0602 Shortness of breath: Secondary | ICD-10-CM | POA: Diagnosis not present

## 2023-10-23 DIAGNOSIS — J9601 Acute respiratory failure with hypoxia: Secondary | ICD-10-CM | POA: Diagnosis not present

## 2023-10-23 DIAGNOSIS — F17210 Nicotine dependence, cigarettes, uncomplicated: Secondary | ICD-10-CM | POA: Diagnosis not present

## 2023-10-23 DIAGNOSIS — J040 Acute laryngitis: Secondary | ICD-10-CM

## 2023-10-23 DIAGNOSIS — J441 Chronic obstructive pulmonary disease with (acute) exacerbation: Secondary | ICD-10-CM | POA: Diagnosis present

## 2023-10-23 DIAGNOSIS — Z79899 Other long term (current) drug therapy: Secondary | ICD-10-CM | POA: Insufficient documentation

## 2023-10-23 DIAGNOSIS — R918 Other nonspecific abnormal finding of lung field: Secondary | ICD-10-CM | POA: Diagnosis not present

## 2023-10-23 DIAGNOSIS — J189 Pneumonia, unspecified organism: Principal | ICD-10-CM | POA: Diagnosis present

## 2023-10-23 DIAGNOSIS — Z1152 Encounter for screening for COVID-19: Secondary | ICD-10-CM | POA: Diagnosis not present

## 2023-10-23 DIAGNOSIS — N183 Chronic kidney disease, stage 3 unspecified: Secondary | ICD-10-CM | POA: Diagnosis present

## 2023-10-23 DIAGNOSIS — I5032 Chronic diastolic (congestive) heart failure: Secondary | ICD-10-CM | POA: Diagnosis present

## 2023-10-23 DIAGNOSIS — I517 Cardiomegaly: Secondary | ICD-10-CM | POA: Diagnosis not present

## 2023-10-23 DIAGNOSIS — I495 Sick sinus syndrome: Secondary | ICD-10-CM | POA: Diagnosis not present

## 2023-10-23 DIAGNOSIS — Z8659 Personal history of other mental and behavioral disorders: Secondary | ICD-10-CM | POA: Insufficient documentation

## 2023-10-23 DIAGNOSIS — R7989 Other specified abnormal findings of blood chemistry: Secondary | ICD-10-CM | POA: Diagnosis not present

## 2023-10-23 DIAGNOSIS — J3089 Other allergic rhinitis: Secondary | ICD-10-CM

## 2023-10-23 DIAGNOSIS — Z950 Presence of cardiac pacemaker: Secondary | ICD-10-CM | POA: Diagnosis present

## 2023-10-23 DIAGNOSIS — E871 Hypo-osmolality and hyponatremia: Secondary | ICD-10-CM | POA: Diagnosis present

## 2023-10-23 LAB — CBC
HCT: 41.6 % (ref 36.0–46.0)
Hemoglobin: 13.6 g/dL (ref 12.0–15.0)
MCH: 29.5 pg (ref 26.0–34.0)
MCHC: 32.7 g/dL (ref 30.0–36.0)
MCV: 90.2 fL (ref 80.0–100.0)
Platelets: 158 10*3/uL (ref 150–400)
RBC: 4.61 MIL/uL (ref 3.87–5.11)
RDW: 13.6 % (ref 11.5–15.5)
WBC: 6.4 10*3/uL (ref 4.0–10.5)
nRBC: 0 % (ref 0.0–0.2)

## 2023-10-23 LAB — BASIC METABOLIC PANEL WITH GFR
Anion gap: 9 (ref 5–15)
BUN: 24 mg/dL — ABNORMAL HIGH (ref 8–23)
CO2: 27 mmol/L (ref 22–32)
Calcium: 9.3 mg/dL (ref 8.9–10.3)
Chloride: 97 mmol/L — ABNORMAL LOW (ref 98–111)
Creatinine, Ser: 1.2 mg/dL — ABNORMAL HIGH (ref 0.44–1.00)
GFR, Estimated: 48 mL/min — ABNORMAL LOW (ref >60)
Glucose, Bld: 88 mg/dL (ref 70–99)
Potassium: 4.2 mmol/L (ref 3.5–5.1)
Sodium: 133 mmol/L — ABNORMAL LOW (ref 135–145)

## 2023-10-23 MED ORDER — ALBUTEROL SULFATE (2.5 MG/3ML) 0.083% IN NEBU
2.5000 mg | INHALATION_SOLUTION | Freq: Once | RESPIRATORY_TRACT | Status: AC
  Administered 2023-10-23: 2.5 mg via RESPIRATORY_TRACT
  Filled 2023-10-23: qty 3

## 2023-10-23 MED ORDER — SODIUM CHLORIDE 0.9 % IV SOLN
1.0000 g | Freq: Once | INTRAVENOUS | Status: AC
  Administered 2023-10-23: 1 g via INTRAVENOUS
  Filled 2023-10-23: qty 10

## 2023-10-23 MED ORDER — MAGNESIUM SULFATE 2 GM/50ML IV SOLN
2.0000 g | Freq: Once | INTRAVENOUS | Status: AC
  Administered 2023-10-23: 2 g via INTRAVENOUS
  Filled 2023-10-23: qty 50

## 2023-10-23 MED ORDER — ALBUTEROL SULFATE HFA 108 (90 BASE) MCG/ACT IN AERS
2.0000 | INHALATION_SPRAY | RESPIRATORY_TRACT | Status: DC | PRN
  Administered 2023-10-24: 2 via RESPIRATORY_TRACT
  Filled 2023-10-23: qty 6.7

## 2023-10-23 MED ORDER — IPRATROPIUM-ALBUTEROL 0.5-2.5 (3) MG/3ML IN SOLN
3.0000 mL | Freq: Once | RESPIRATORY_TRACT | Status: AC
  Administered 2023-10-23: 3 mL via RESPIRATORY_TRACT
  Filled 2023-10-23: qty 3

## 2023-10-23 MED ORDER — AEROCHAMBER PLUS FLO-VU MISC
1.0000 | Freq: Once | Status: AC
  Administered 2023-10-23: 1
  Filled 2023-10-23: qty 1

## 2023-10-23 MED ORDER — SODIUM CHLORIDE 0.9 % IV SOLN
500.0000 mg | Freq: Once | INTRAVENOUS | Status: AC
  Administered 2023-10-24: 500 mg via INTRAVENOUS
  Filled 2023-10-23: qty 5

## 2023-10-23 MED ORDER — METHYLPREDNISOLONE SODIUM SUCC 125 MG IJ SOLR
125.0000 mg | Freq: Once | INTRAMUSCULAR | Status: AC
  Administered 2023-10-23: 125 mg via INTRAVENOUS
  Filled 2023-10-23: qty 2

## 2023-10-23 NOTE — ED Triage Notes (Signed)
 Pt reports sudden SOB that started 6 pm. Has labored breathing with exp/ins wheezing.

## 2023-10-23 NOTE — ED Notes (Signed)
 RT assessed pt in triage for SOB. Pts BLBS exp whz throughout. Pt see pulmonology [place] for COPD. Pt respiratory status stable w/no distress w/mod increased WOB w/no distress noted at this time. RT initiated 5 and 5 per protocol. Pt tolerating well. RT will continue to monitor while @ [facility].    10/23/23 2232  Therapy Vitals  Pulse Rate (!) 127  Resp (!) 24  Patient Position (if appropriate) Sitting  MEWS Score/Color  MEWS Score 3  MEWS Score Color Yellow  Respiratory Assessment  $ RT Protocol Assessment  Yes  Assessment Type Pre-treatment  Respiratory Pattern Regular;Labored;Dyspnea at rest;Symmetrical;Prolonged exhalation;Tachypnea  Chest Assessment Chest expansion symmetrical  Cough Strong;Congested  Bilateral Breath Sounds Expiratory wheezes  R Upper  Breath Sounds Expiratory wheezes  L Upper Breath Sounds Expiratory wheezes  R Lower Breath Sounds Expiratory wheezes  L Lower Breath Sounds Expiratory wheezes  Oxygen  Therapy/Pulse Ox  O2 Device Room Air  O2 Therapy Room air  SpO2 97 %

## 2023-10-23 NOTE — ED Provider Notes (Signed)
 [place] EMERGENCY DEPARTMENT AT [facility] Provider Note  CSN: [id number] Arrival date & time: 10/23/23 2218  Chief Complaint(s) Shortness of Breath  HPI Sheila Morales is a 73 y.o. female with past medical history as below, significant for A-fib, CHF, COPD, interstitial lung disease, lupus, pacemaker who presents to the ED with complaint of cough, dyspnea  History of ILD, follows Dr. Annella  Worsening difficulty breathing this evening, exertional dyspnea, cough productive with white/clear sputum.  She is compliant with home medications.  No recent travel or sick contacts.  No fevers or chills.  No chest pain.  No nausea or vomiting Abdominal pain.  No change with p.o. intake.  Does not use home oxygen  any longer  Has frequent bouts of pneumonia, typically improves with steroids and antitussive  Mucinex .  Past Medical History Past Medical History:  Diagnosis Date   A-fib (HCC)    Acute respiratory failure with hypoxia (HCC) 11/05/2017   Arthritis    Cervicalgia    CHF (congestive heart failure) (HCC)    Colitis, ischemic (HCC)    COPD (chronic obstructive pulmonary disease) (HCC)    External hemorrhoids    H/O: rheumatic fever    Heart disease    IBS (irritable bowel syndrome)    Lupus (systemic lupus erythematosus) (HCC)    NAFLD (nonalcoholic fatty liver disease)    Osteoporosis    Pacemaker    Personal history colonic adenoma 03/25/2008   06/2007 right sided adenoma and 2 right hyperplastic polyps     Restless leg syndrome    questionable   Sleep apnea    Systemic lupus erythematosus (HCC)    Tubular adenoma of colon    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    Patient Active Problem List   Diagnosis Date Noted   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 09/18/2022   Elevated LFTs 06/20/2022   Snoring 12/18/2021   Interstitial lung disease (HCC) 04/23/2021   AF (paroxysmal atrial fibrillation) (HCC) 03/28/2021   Chronic anticoagulation 03/28/2021    Chronic diastolic CHF (congestive heart failure) (HCC) 03/21/2021   Pacemaker 09/07/2020   Left foot pain 06/30/2020   Sinus node dysfunction (HCC) 05/26/2020   Bradycardia 05/11/2020   Hypertension 02/02/2020   LLQ pain 11/23/2019   BPPV (benign paroxysmal positional vertigo) 12/16/2017   Hyponatremia 11/01/2017   Insomnia 02/24/2017   COPD (chronic obstructive pulmonary disease) (HCC) 01/22/2017   Routine general medical examination at a health care facility 02/19/2016   Hereditary and idiopathic peripheral neuropathy 02/17/2015   Systemic lupus erythematosus (HCC) 10/13/2013   Irritable bowel syndrome 03/25/2008   History of rheumatic fever as a child 01/17/2007   Home Medication(s) Prior to Admission medications   Medication Sig Start Date End Date Taking? Authorizing Provider  acetaminophen  (TYLENOL ) 325 MG tablet Take 650 mg by mouth every 6 (six) hours as needed for moderate pain or mild pain.    [provider]  albuterol  (VENTOLIN  HFA) 108 (90 Base) MCG/ACT inhaler Inhale 1-2 puffs into the lungs every 6 (six) hours as needed for wheezing or shortness of breath. 02/20/23   Hunsucker, Donnice SAUNDERS, MD  apixaban  (ELIQUIS ) 5 MG TABS tablet Take 1 tablet (5 mg total) by mouth 2 (two) times daily. 06/17/23   Acharya, Gayatri A, MD  Bacillus Coagulans-Inulin (PROBIOTIC-PREBIOTIC PO) Take by mouth. Bio Complete    [provider]  benzonatate  (TESSALON ) 100 MG capsule Take 1 capsule (100 mg total) by mouth every 6 (six) hours as needed for  cough. 10/06/23   Hunsucker, Donnice SAUNDERS, MD  CALCIUM  PO Take 600 mg by mouth.    [provider]  Cholecalciferol  (VITAMIN D ) 50 MCG (2000 UT) tablet Take 4,000 Units by mouth every evening.    [provider]  DM-APAP-CPM (CORICIDIN HBP PO) Take 5 mLs by mouth every 6 (six) hours as needed (congestion).    [provider]  empagliflozin  (JARDIANCE ) 10 MG TABS tablet TAKE 1 TABLET BY MOUTH DAILY BEFORE  BREAKFAST. 06/25/23   Loni Soyla LABOR, MD  fluticasone  (FLONASE ) 50 MCG/ACT nasal spray SPRAY 2 SPRAYS INTO EACH NOSTRIL EVERY DAY Patient taking differently: Place 2 sprays into both nostrils daily as needed for allergies. 10/22/22   Hunsucker, Donnice SAUNDERS, MD  furosemide  (LASIX ) 20 MG tablet TAKE 1 TABLET (20 MG) BY MOUTH DAILY, TAKE 1 EXTRA TABLET BY MOUTH (20 MG) AS NEEDED FOR INCREASED SWELLING. 10/13/23   Loni Soyla LABOR, MD  gabapentin  (NEURONTIN ) 300 MG capsule TAKE 2 CAPSULES BY MOUTH EVERY MORNING, 2 CAPSULES EACH AFTERNOON AND 3 CAPSULES AT BEDTIME 07/08/23   Rollene Almarie LABOR, MD  guaiFENesin  (MUCINEX ) 600 MG 12 hr tablet Take 600 mg by mouth 2 (two) times daily as needed (congestion).    [provider]  MAGNESIUM  COMPLEX HIGH POTENCY PO Take 250 mg by mouth.    [provider]  metoprolol  succinate (TOPROL -XL) 25 MG 24 hr tablet TAKE 1 TABLET (25 MG TOTAL) BY MOUTH DAILY. 03/13/23   Acharya, Gayatri A, MD  Misc Natural Products (ADVANCED JOINT RELIEF) CAPS Take 1 capsule by mouth daily. Joint Advantage Gold 5x    [provider]  Multiple Vitamin (MULTIVITAMIN WITH MINERALS) TABS tablet Take 1 tablet by mouth daily.    [provider]  predniSONE  (DELTASONE ) 20 MG tablet AS NEEDED IF BRONCHITIS RECURS. TAKE 40 MG ONCE DAILY FOR 5 DAYS. 07/23/23   Hunsucker, Donnice SAUNDERS, MD  RESTASIS  0.05 % ophthalmic emulsion Place 1 drop into both eyes 3 (three) times daily. 07/30/17   [provider]  triamcinolone  cream (KENALOG ) 0.1 % Apply 1 Application topically daily as needed (irritation).    [provider]  TURMERIC CURCUMIN PO Take by mouth.    [provider]  umeclidinium-vilanterol (ANORO ELLIPTA ) 62.5-25 MCG/ACT AEPB Inhale 1 puff into the lungs daily. 02/12/23   Hunsucker, Donnice SAUNDERS, MD  vitamin C  (ASCORBIC ACID ) 500 MG tablet Take 1,000 mg by mouth daily.    [provider]  VITAMIN E  PO Take 150 mg by mouth daily.     [provider]  zolpidem  (AMBIEN ) 5 MG tablet TAKE 1 TABLET BY MOUTH AT BEDTIME AS NEEDED FOR SLEEP 05/19/23   Rollene Almarie LABOR, MD                                                                                                                                    Past Surgical History Past Surgical  History:  Procedure Laterality Date   ANTERIOR CERVICAL DECOMP/DISCECTOMY FUSION N/A 09/18/2022   Procedure: Anterior Cervical Discectomy Fusion Cervical Four-Cervical Five, Cervical Five-Cervical Six;  Surgeon: Onetha Kuba, MD;  Location: [facility] OR;  Service: Neurosurgery;  Laterality: N/A;   CARDIOVERSION N/A 04/08/2023   Procedure: CARDIOVERSION;  Surgeon: Loni Soyla LABOR, MD;  Location: [facility] INVASIVE CV LAB;  Service: Cardiovascular;  Laterality: N/A;   CHOLECYSTECTOMY     CHOLECYSTECTOMY, LAPAROSCOPIC     COLONOSCOPY     ESOPHAGOGASTRODUODENOSCOPY     lesion, vulva excision     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   Procedure: PACEMAKER IMPLANT;  Surgeon: Waddell Danelle ORN, MD;  Location: [facility] INVASIVE CV LAB;  Service: Cardiovascular;  Laterality: N/A;   TONSILLECTOMY     TRANSESOPHAGEAL ECHOCARDIOGRAM (CATH LAB) N/A 04/08/2023   Procedure: TRANSESOPHAGEAL ECHOCARDIOGRAM;  Surgeon: Loni Soyla LABOR, MD;  Location: [facility] INVASIVE CV LAB;  Service: Cardiovascular;  Laterality: N/A;   TUBAL LIGATION  1977   VIDEO BRONCHOSCOPY Bilateral 11/06/2017   Procedure: VIDEO BRONCHOSCOPY WITHOUT FLUORO;  Surgeon: Mannam, Praveen, MD;  Location: [facility] ENDOSCOPY;  Service: Cardiopulmonary;  Laterality: Bilateral;   Family History Family History  Problem Relation Age of Onset   Stroke Mother        Deceased, 75s   Atrial fibrillation Father    Colon cancer Father    Dementia Father    Dementia Sister    Other Sister        Pick's disease, deceased   Healthy Daughter    Vascular Disease Other    Other Other        PIK's disease    Social History Social History   Tobacco Use   Smoking status:  Some Days    Current packs/day: 0.00    Average packs/day: 0.3 packs/day for 30.0 years (7.5 ttl pk-yrs)    Types: Cigarettes    Start date: 01/20/1990    Last attempt to quit: 01/21/2020    Years since quitting: 3.7   Smokeless tobacco: Never   Tobacco comments:    smokes every now and then 08/24/20 [facility]     09/25/2023 I don't smoke that much anymore  Vaping Use   Vaping status: Never Used  Substance Use Topics   Alcohol use: Not Currently    Alcohol/week: 5.0 standard drinks of alcohol    Types: 5 Standard drinks or equivalent per week    Comment: 4 out of 7 dyas per husband at bedside   Drug use: Never   Allergies Patient has no known allergies.  Review of Systems A thorough review of systems was obtained and all systems are negative except as noted in the HPI and PMH.   Physical Exam Vital Signs  I have reviewed the triage vital signs BP 123/77   Pulse 81   Temp 98 F (36.7 C) (Oral)   Resp 17   SpO2 99%  Physical Exam Vitals and nursing note reviewed.  Constitutional:      Appearance: Normal appearance. She is well-developed. She is not toxic-appearing.  HENT:     Head: Normocephalic and atraumatic.     Right Ear: External ear normal.     Left Ear: External ear normal.     Nose: Nose normal.     Mouth/Throat:     Mouth: Mucous membranes are moist.  Eyes:     General: No scleral icterus.       Right eye: No discharge.        Left  eye: No discharge.  Cardiovascular:     Rate and Rhythm: Normal rate and regular rhythm.     Pulses: Normal pulses.     Heart sounds: Normal heart sounds.  Pulmonary:     Effort: Pulmonary effort is normal. Tachypnea present. No accessory muscle usage.     Breath sounds: No stridor. Decreased breath sounds and wheezing present.  Abdominal:     General: Abdomen is flat. There is no distension.     Palpations: Abdomen is soft.     Tenderness: There is no abdominal tenderness.  Musculoskeletal:     Cervical back: No rigidity.      Right lower leg: No edema.     Left lower leg: No edema.  Skin:    General: Skin is warm and dry.     Capillary Refill: Capillary refill takes less than 2 seconds.  Neurological:     Mental Status: She is alert.  Psychiatric:        Mood and Affect: Mood normal.        Behavior: Behavior normal. Behavior is cooperative.     ED Results and Treatments Labs (all labs ordered are listed, but only abnormal results are displayed) Labs Reviewed  BASIC METABOLIC PANEL WITH GFR - Abnormal; Notable for the following components:      Result Value   Sodium 133 (*)    Chloride 97 (*)    BUN 24 (*)    Creatinine, Ser 1.20 (*)    GFR, Estimated 48 (*)    All other components within normal limits  TROPONIN T, HIGH SENSITIVITY - Abnormal; Notable for the following components:   Troponin T High Sensitivity 27 (*)    All other components within normal limits  RESP PANEL BY RT-PCR (RSV, FLU A&B, COVID)  RVPGX2  CBC  TROPONIN T, HIGH SENSITIVITY                                                                                                                          Radiology DG Chest Portable 1 View Result Date: 10/23/2023 CLINICAL DATA:  SOB EXAM: PORTABLE CHEST - 1 VIEW COMPARISON:  None available. FINDINGS: Diffuse interstitial opacities throughout both lungs. No focal airspace consolidation, pleural effusion, or pneumothorax. Mild cardiomegaly. Left chest pacemaker with leads terminating in the right atrium and right ventricle. Tortuous aorta with aortic atherosclerosis. No acute fracture or destructive lesions. Multilevel thoracic osteophytosis. Cervical fusion hardware partially visualized. IMPRESSION: Diffuse interstitial opacities throughout both lungs, which may represent viral or atypical infection or interstitial edema. Alternatively, this could reflect changes of underlying interstitial lung disease. Electronically Signed   By: Rogelia Myers M.D.   On: 10/23/2023 22:49    Pertinent labs  & imaging results that were available during my care of the patient were reviewed by me and considered in my medical decision making (see MDM for details).  Medications Ordered in ED Medications  azithromycin  (ZITHROMAX ) 500 mg in sodium chloride  0.9 % 250  mL IVPB (500 mg Intravenous New Bag/Given 10/24/23 0020)  albuterol  (VENTOLIN  HFA) 108 (90 Base) MCG/ACT inhaler 2 puff (2 puffs Inhalation Given 10/24/23 0014)  ipratropium-albuterol  (DUONEB) 0.5-2.5 (3) MG/3ML nebulizer solution 3 mL (3 mLs Nebulization Given 10/23/23 2232)  albuterol  (PROVENTIL ) (2.5 MG/3ML) 0.083% nebulizer solution 2.5 mg (2.5 mg Nebulization Given 10/23/23 2232)  methylPREDNISolone  sodium succinate (SOLU-MEDROL ) 125 mg/2 mL injection 125 mg (125 mg Intravenous Given 10/23/23 2324)  cefTRIAXone  (ROCEPHIN ) 1 g in sodium chloride  0.9 % 100 mL IVPB (0 g Intravenous Stopped 10/24/23 0015)  magnesium  sulfate IVPB 2 g 50 mL (0 g Intravenous Stopped 10/23/23 2342)  Aerochamber Plus device 1 each (1 each Other Given 10/23/23 2346)                                                                                                                                     Procedures .Critical Care  Performed by: Elnor Jayson LABOR, DO Authorized by: Elnor Jayson LABOR, DO   Critical care provider statement:    Critical care time (minutes):  30   Critical care time was exclusive of:  Separately billable procedures and treating other patients   Critical care was necessary to treat or prevent imminent or life-threatening deterioration of the following conditions:  Respiratory failure   Critical care was time spent personally by me on the following activities:  Development of treatment plan with patient or surrogate, discussions with consultants, evaluation of patient's response to treatment, examination of patient, ordering and review of laboratory studies, ordering and review of radiographic studies, ordering and performing treatments and interventions, pulse  oximetry, re-evaluation of patient's condition, review of old charts and obtaining history from patient or surrogate   (including critical care time)  Medical Decision Making / ED Course    Medical Decision Making:    ZEDA GANGWER is a 73 y.o. female with past medical history as below, significant for A-fib, CHF, COPD, interstitial lung disease, lupus, pacemaker who presents to the ED with complaint of cough, dyspnea. The complaint involves an extensive differential diagnosis and also carries with it a high risk of complications and morbidity.  Serious etiology was considered. Ddx includes but is not limited to: In my evaluation of this patient's dyspnea my DDx includes, but is not limited to, pneumonia, pulmonary embolism, pneumothorax, pulmonary edema, metabolic acidosis, asthma, COPD, cardiac cause, anemia, anxiety, etc.    Complete initial physical exam performed, notably the patient was in mild respiratory stress, she has tachypnea, increased work of breathing.    Reviewed and confirmed nursing documentation for past medical history, family history, social history.  Vital signs reviewed.     Brief summary:  73 year old female history of A-fib, CAD, COPD, ILD here with cough, dyspnea       Dyspnea Hx ILD, COPD, recurrent PNA> - Increased work of breathing, tachypnea, conversational dyspnea, no hypoxia, placed on 2 L nasal cannula for +  WOB - Wheezing noted, give nebulized breathing treatments and Solu-Medrol , mag sulfate - Chest x-ray concerning for atypical pneumonia versus ILD vs edema; not septic - mild trop leak, no cp, likely demand ischemia 2/2 dib  Handoff incoming EDP pending remainder of labs and recheck            Additional history obtained: -Additional history obtained from family -External records from outside source obtained and reviewed including: Chart review including previous notes, labs, imaging, consultation notes including  Home meds Prior  labs   Lab Tests: -I ordered, reviewed, and interpreted labs.   The pertinent results include:   Labs Reviewed  BASIC METABOLIC PANEL WITH GFR - Abnormal; Notable for the following components:      Result Value   Sodium 133 (*)    Chloride 97 (*)    BUN 24 (*)    Creatinine, Ser 1.20 (*)    GFR, Estimated 48 (*)    All other components within normal limits  TROPONIN T, HIGH SENSITIVITY - Abnormal; Notable for the following components:   Troponin T High Sensitivity 27 (*)    All other components within normal limits  RESP PANEL BY RT-PCR (RSV, FLU A&B, COVID)  RVPGX2  CBC  TROPONIN T, HIGH SENSITIVITY    Notable for labs stable so far   EKG   EKG Interpretation Date/Time:  Thursday October 23 2023 23:49:48 EDT Ventricular Rate:  104 PR Interval:  38 QRS Duration:  101 QT Interval:  404 QTC Calculation: 443 R Axis:   83  Text Interpretation: Sinus tachycardia Supraventricular bigeminy Short PR interval Consider right ventricular hypertrophy Nonspecific T abnormalities, lateral leads Interpretation limited secondary to artifact Confirmed by Midge Golas ([id number]) on 10/23/2023 11:54:33 PM         Imaging Studies ordered: I ordered imaging studies including cxr I independently visualized the following imaging with scope of interpretation limited to determining acute life threatening conditions related to emergency care; findings noted above I agree with the radiologist interpretation If any imaging was obtained with contrast I closely monitored patient for any possible adverse reaction a/w contrast administration in the emergency department   Medicines ordered and prescription drug management: Meds ordered this encounter  Medications   ipratropium-albuterol  (DUONEB) 0.5-2.5 (3) MG/3ML nebulizer solution 3 mL   albuterol  (PROVENTIL ) (2.5 MG/3ML) 0.083% nebulizer solution 2.5 mg   methylPREDNISolone  sodium succinate (SOLU-MEDROL ) 125 mg/2 mL injection 125 mg    cefTRIAXone  (ROCEPHIN ) 1 g in sodium chloride  0.9 % 100 mL IVPB    Antibiotic Indication::   CAP   azithromycin  (ZITHROMAX ) 500 mg in sodium chloride  0.9 % 250 mL IVPB    Antibiotic Indication::   CAP   magnesium  sulfate IVPB 2 g 50 mL   Aerochamber Plus device 1 each   albuterol  (VENTOLIN  HFA) 108 (90 Base) MCG/ACT inhaler 2 puff    -I have reviewed the patients home medicines and have made adjustments as needed   Consultations Obtained: na   Cardiac Monitoring: The patient was maintained on a cardiac monitor.  I personally viewed and interpreted the cardiac monitored which showed an underlying rhythm of: nsr Continuous pulse oximetry interpreted by myself, 99% on 2L.    Social Determinants of Health:  Diagnosis or treatment significantly limited by social determinants of health: current smoker Counseled patient for approximately 3 minutes regarding smoking cessation. Discussed risks of smoking and how they applied and affected their visit here today.     Reevaluation: After the interventions noted above,  I reevaluated the patient and found that they have improved  Co morbidities that complicate the patient evaluation  Past Medical History:  Diagnosis Date   A-fib (HCC)    Acute respiratory failure with hypoxia (HCC) 11/05/2017   Arthritis    Cervicalgia    CHF (congestive heart failure) (HCC)    Colitis, ischemic (HCC)    COPD (chronic obstructive pulmonary disease) (HCC)    External hemorrhoids    H/O: rheumatic fever    Heart disease    IBS (irritable bowel syndrome)    Lupus (systemic lupus erythematosus) (HCC)    NAFLD (nonalcoholic fatty liver disease)    Osteoporosis    Pacemaker    Personal history colonic adenoma 03/25/2008   06/2007 right sided adenoma and 2 right hyperplastic polyps     Restless leg syndrome    questionable   Sleep apnea    Systemic lupus erythematosus (HCC)    Tubular adenoma of colon    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    Final Clinical Impression(s) / ED Diagnoses Final diagnoses:  None        Elnor Jayson LABOR, DO 10/24/23 0031

## 2023-10-24 DIAGNOSIS — J441 Chronic obstructive pulmonary disease with (acute) exacerbation: Secondary | ICD-10-CM | POA: Diagnosis present

## 2023-10-24 DIAGNOSIS — I495 Sick sinus syndrome: Secondary | ICD-10-CM | POA: Diagnosis not present

## 2023-10-24 DIAGNOSIS — J189 Pneumonia, unspecified organism: Secondary | ICD-10-CM | POA: Diagnosis present

## 2023-10-24 DIAGNOSIS — J849 Interstitial pulmonary disease, unspecified: Secondary | ICD-10-CM | POA: Diagnosis not present

## 2023-10-24 DIAGNOSIS — G629 Polyneuropathy, unspecified: Secondary | ICD-10-CM | POA: Diagnosis not present

## 2023-10-24 DIAGNOSIS — Z8659 Personal history of other mental and behavioral disorders: Secondary | ICD-10-CM | POA: Diagnosis not present

## 2023-10-24 DIAGNOSIS — G6289 Other specified polyneuropathies: Secondary | ICD-10-CM

## 2023-10-24 DIAGNOSIS — R7989 Other specified abnormal findings of blood chemistry: Secondary | ICD-10-CM | POA: Diagnosis present

## 2023-10-24 DIAGNOSIS — N1831 Chronic kidney disease, stage 3a: Secondary | ICD-10-CM | POA: Diagnosis not present

## 2023-10-24 DIAGNOSIS — J9601 Acute respiratory failure with hypoxia: Secondary | ICD-10-CM | POA: Insufficient documentation

## 2023-10-24 DIAGNOSIS — I480 Paroxysmal atrial fibrillation: Secondary | ICD-10-CM

## 2023-10-24 DIAGNOSIS — E871 Hypo-osmolality and hyponatremia: Secondary | ICD-10-CM | POA: Diagnosis not present

## 2023-10-24 DIAGNOSIS — Z1152 Encounter for screening for COVID-19: Secondary | ICD-10-CM | POA: Diagnosis not present

## 2023-10-24 DIAGNOSIS — I5032 Chronic diastolic (congestive) heart failure: Secondary | ICD-10-CM | POA: Diagnosis not present

## 2023-10-24 DIAGNOSIS — Z950 Presence of cardiac pacemaker: Secondary | ICD-10-CM | POA: Diagnosis not present

## 2023-10-24 DIAGNOSIS — R0602 Shortness of breath: Secondary | ICD-10-CM | POA: Diagnosis present

## 2023-10-24 DIAGNOSIS — N183 Chronic kidney disease, stage 3 unspecified: Secondary | ICD-10-CM | POA: Diagnosis present

## 2023-10-24 DIAGNOSIS — Z79899 Other long term (current) drug therapy: Secondary | ICD-10-CM | POA: Diagnosis not present

## 2023-10-24 DIAGNOSIS — F17210 Nicotine dependence, cigarettes, uncomplicated: Secondary | ICD-10-CM | POA: Diagnosis not present

## 2023-10-24 LAB — RESP PANEL BY RT-PCR (RSV, FLU A&B, COVID)  RVPGX2
Influenza A by PCR: NEGATIVE
Influenza B by PCR: NEGATIVE
Resp Syncytial Virus by PCR: NEGATIVE
SARS Coronavirus 2 by RT PCR: NEGATIVE

## 2023-10-24 LAB — I-STAT ARTERIAL BLOOD GAS, ED
Acid-base deficit: 4 mmol/L — ABNORMAL HIGH (ref 0.0–2.0)
Bicarbonate: 21.8 mmol/L (ref 20.0–28.0)
Calcium, Ion: 1.21 mmol/L (ref 1.15–1.40)
HCT: 38 % (ref 36.0–46.0)
Hemoglobin: 12.9 g/dL (ref 12.0–15.0)
O2 Saturation: 97 %
Patient temperature: 98
Potassium: 4 mmol/L (ref 3.5–5.1)
Sodium: 134 mmol/L — ABNORMAL LOW (ref 135–145)
TCO2: 23 mmol/L (ref 22–32)
pCO2 arterial: 42.9 mmHg (ref 32–48)
pH, Arterial: 7.313 — ABNORMAL LOW (ref 7.35–7.45)
pO2, Arterial: 95 mmHg (ref 83–108)

## 2023-10-24 LAB — TROPONIN T, HIGH SENSITIVITY
Troponin T High Sensitivity: 25 ng/L — ABNORMAL HIGH (ref <19)
Troponin T High Sensitivity: 27 ng/L — ABNORMAL HIGH (ref <19)

## 2023-10-24 LAB — GLUCOSE, CAPILLARY
Glucose-Capillary: 169 mg/dL — ABNORMAL HIGH (ref 70–99)
Glucose-Capillary: 174 mg/dL — ABNORMAL HIGH (ref 70–99)

## 2023-10-24 LAB — PROCALCITONIN: Procalcitonin: <0.1 ng/mL

## 2023-10-24 LAB — BRAIN NATRIURETIC PEPTIDE: B Natriuretic Peptide: 864.4 pg/mL — ABNORMAL HIGH (ref 0.0–100.0)

## 2023-10-24 MED ORDER — DM-APAP-CPM 10-325-2 MG PO TABS: ORAL_TABLET | Freq: Four times a day (QID) | ORAL | Status: DC | PRN

## 2023-10-24 MED ORDER — PREDNISONE 20 MG PO TABS
40.0000 mg | ORAL_TABLET | Freq: Every day | ORAL | Status: DC
  Administered 2023-10-24 – 2023-10-25 (×2): 40 mg via ORAL
  Filled 2023-10-24 (×2): qty 2

## 2023-10-24 MED ORDER — SODIUM CHLORIDE 0.9 % IV SOLN
500.0000 mg | INTRAVENOUS | Status: DC
  Administered 2023-10-24: 500 mg via INTRAVENOUS
  Filled 2023-10-24: qty 5

## 2023-10-24 MED ORDER — IPRATROPIUM-ALBUTEROL 0.5-2.5 (3) MG/3ML IN SOLN
3.0000 mL | Freq: Four times a day (QID) | RESPIRATORY_TRACT | Status: DC
  Administered 2023-10-24: 3 mL via RESPIRATORY_TRACT
  Filled 2023-10-24: qty 3

## 2023-10-24 MED ORDER — RISAQUAD PO CAPS
1.0000 | ORAL_CAPSULE | Freq: Every day | ORAL | Status: DC
  Administered 2023-10-25: 1 via ORAL
  Filled 2023-10-24: qty 1

## 2023-10-24 MED ORDER — IPRATROPIUM-ALBUTEROL 0.5-2.5 (3) MG/3ML IN SOLN
3.0000 mL | Freq: Three times a day (TID) | RESPIRATORY_TRACT | Status: DC
  Administered 2023-10-24 – 2023-10-25 (×2): 3 mL via RESPIRATORY_TRACT
  Filled 2023-10-24 (×2): qty 3

## 2023-10-24 MED ORDER — ZOLPIDEM TARTRATE 5 MG PO TABS
5.0000 mg | ORAL_TABLET | Freq: Every evening | ORAL | Status: DC | PRN
  Administered 2023-10-24: 5 mg via ORAL
  Filled 2023-10-24: qty 1

## 2023-10-24 MED ORDER — ALBUTEROL SULFATE (2.5 MG/3ML) 0.083% IN NEBU: 2.5000 mg | INHALATION_SOLUTION | RESPIRATORY_TRACT | Status: DC | PRN

## 2023-10-24 MED ORDER — FLUTICASONE PROPIONATE 50 MCG/ACT NA SUSP: 2.0000 | Freq: Every day | NASAL | Status: DC | PRN

## 2023-10-24 MED ORDER — BENZONATATE 100 MG PO CAPS: 200.0000 mg | ORAL_CAPSULE | Freq: Two times a day (BID) | ORAL | Status: DC | PRN

## 2023-10-24 MED ORDER — CYCLOSPORINE 0.05 % OP EMUL
1.0000 [drp] | Freq: Three times a day (TID) | OPHTHALMIC | Status: DC
  Administered 2023-10-24 – 2023-10-25 (×2): 1 [drp] via OPHTHALMIC
  Filled 2023-10-24 (×4): qty 30

## 2023-10-24 MED ORDER — ACETAMINOPHEN 325 MG PO TABS
650.0000 mg | ORAL_TABLET | Freq: Four times a day (QID) | ORAL | Status: DC | PRN
  Administered 2023-10-24: 650 mg via ORAL
  Filled 2023-10-24: qty 2

## 2023-10-24 MED ORDER — METOPROLOL SUCCINATE ER 25 MG PO TB24
25.0000 mg | ORAL_TABLET | Freq: Every day | ORAL | Status: DC
  Administered 2023-10-25: 25 mg via ORAL
  Filled 2023-10-24: qty 1

## 2023-10-24 MED ORDER — ONDANSETRON HCL 4 MG/2ML IJ SOLN: 4.0000 mg | Freq: Four times a day (QID) | INTRAMUSCULAR | Status: DC | PRN

## 2023-10-24 MED ORDER — APIXABAN 5 MG PO TABS
5.0000 mg | ORAL_TABLET | Freq: Two times a day (BID) | ORAL | Status: DC
  Administered 2023-10-24 – 2023-10-25 (×2): 5 mg via ORAL
  Filled 2023-10-24 (×2): qty 1

## 2023-10-24 MED ORDER — SODIUM CHLORIDE 0.9 % IV SOLN
2.0000 g | INTRAVENOUS | Status: DC
  Administered 2023-10-24: 2 g via INTRAVENOUS
  Filled 2023-10-24: qty 20

## 2023-10-24 MED ORDER — ENOXAPARIN SODIUM 40 MG/0.4ML IJ SOSY
40.0000 mg | PREFILLED_SYRINGE | INTRAMUSCULAR | Status: DC
  Administered 2023-10-24: 40 mg via SUBCUTANEOUS
  Filled 2023-10-24: qty 0.4

## 2023-10-24 MED ORDER — GABAPENTIN 300 MG PO CAPS
600.0000 mg | ORAL_CAPSULE | Freq: Three times a day (TID) | ORAL | Status: DC
  Administered 2023-10-24 – 2023-10-25 (×2): 600 mg via ORAL
  Filled 2023-10-24 (×2): qty 2

## 2023-10-24 MED ORDER — MAGNESIUM GLUCONATE 500 (27 MG) MG PO TABS
250.0000 mg | ORAL_TABLET | Freq: Every day | ORAL | Status: DC
  Administered 2023-10-25: 250 mg via ORAL
  Filled 2023-10-24: qty 1

## 2023-10-24 MED ORDER — EMPAGLIFLOZIN 10 MG PO TABS
10.0000 mg | ORAL_TABLET | Freq: Every day | ORAL | Status: DC
  Filled 2023-10-24: qty 1

## 2023-10-24 MED ORDER — MAGNESIUM 250 MG PO CAPS: 250.0000 mg | ORAL_CAPSULE | Freq: Every day | ORAL | Status: DC

## 2023-10-24 MED ORDER — FUROSEMIDE 10 MG/ML IJ SOLN: 40.0000 mg | Freq: Once | INTRAMUSCULAR | Status: DC

## 2023-10-24 MED ORDER — SODIUM CHLORIDE 0.9% FLUSH
3.0000 mL | Freq: Two times a day (BID) | INTRAVENOUS | Status: DC
  Administered 2023-10-24 – 2023-10-25 (×3): 3 mL via INTRAVENOUS

## 2023-10-24 MED ORDER — ONDANSETRON HCL 4 MG PO TABS: 4.0000 mg | ORAL_TABLET | Freq: Four times a day (QID) | ORAL | Status: DC | PRN

## 2023-10-24 MED ORDER — PROBIOTIC-PREBIOTIC 1-250 BILLION-MG PO CAPS: ORAL_CAPSULE | Freq: Every day | ORAL | Status: DC

## 2023-10-24 MED ORDER — GABAPENTIN 300 MG PO CAPS: 600.0000 mg | ORAL_CAPSULE | Freq: Three times a day (TID) | ORAL | Status: DC

## 2023-10-24 MED ORDER — ACETAMINOPHEN 650 MG RE SUPP
650.0000 mg | Freq: Four times a day (QID) | RECTAL | Status: DC | PRN
Start: 2023-10-24 — End: 2023-10-25

## 2023-10-24 MED ORDER — GUAIFENESIN ER 600 MG PO TB12
600.0000 mg | ORAL_TABLET | Freq: Two times a day (BID) | ORAL | Status: DC
  Administered 2023-10-24 – 2023-10-25 (×3): 600 mg via ORAL
  Filled 2023-10-24 (×3): qty 1

## 2023-10-24 MED ORDER — FUROSEMIDE 10 MG/ML IJ SOLN
20.0000 mg | Freq: Once | INTRAMUSCULAR | Status: AC
  Administered 2023-10-24: 20 mg via INTRAVENOUS
  Filled 2023-10-24: qty 2

## 2023-10-24 NOTE — H&P (Signed)
 History and Physical    Patient: Sheila Morales FMW:[id number] DOB: 1951-01-02 DOA: 10/23/2023 DOS: the patient was seen and examined on 10/24/2023 PCP: Clarice Nottingham, MD  Patient coming from: Home  Chief Complaint:  Chief Complaint  Patient presents with   Shortness of Breath   HPI: TIEARRA COLWELL is a 73 y.o. female with medical history significant of atrial fibrillation, sinus node dysfunction s/p post pacemaker, HFpEF, COPD, ILD, lupus, CKD stage IIIa, and OSA presents with acute shortness of breath.  She experienced a sudden onset of difficulty breathing last night, which worsened with movement. She has a little cough, primarily due to sinus drainage, and coughed up some mucus. Wheezing was noted yesterday. She is not normally on oxygen .  Her history includes COPD and interstitial lung disease. She was hospitalized multiple times in 2023 for congestive heart failure, with the last episode in December 2024. Past treatments included prednisone  40 mg for five days, Tessalon  Perles for cough, and additional Lasix  for fluid management. She missed Lasix  and delayed refilling Tessalon  Perles.  She uses Anoro and albuterol  inhalers every six hours as needed but lacks access to breathing treatments at home. She is supposed to use a CPAP machine at night but is currently without one due to vendor issues, despite a recent sleep study on October 11, 2023.  Last night, moving around increased her shortness of breath significantly, prompting her to seek emergency care. She attempted to manage symptoms with Mucinex  but found it insufficient. No edema since her heart failure.   In the ED patient was noted to be afebrile with initial heart rate is elevated to 127, respirations 28, and O2 saturations currently maintained on 2 L nasal cannula oxygen .  ABG noted pH to be 7.313 with pCO2 42.9, and PO2 95.  Labs on 7/30 significant for WBC 6.4, sodium 133, chloride 97, BUN 24, creatinine 1.2, and high-sensitivity  troponins 27->25. Chest x-ray noted diffuse interstitial opacities throughout both lungs.  Influenza, COVID-19, and RSV screening were negative.  Patient had been given albuterol , DuoNeb, Solu-Medrol , ceftriaxone , azithromycin , and magnesium  IV.  Review of Systems: As mentioned in the history of present illness. All other systems reviewed and are negative. Past Medical History:  Diagnosis Date   A-fib [facility])    Acute respiratory failure with hypoxia (HCC) 11/05/2017   Arthritis    Cervicalgia    CHF (congestive heart failure) (HCC)    Colitis, ischemic (HCC)    COPD (chronic obstructive pulmonary disease) (HCC)    External hemorrhoids    H/O: rheumatic fever    Heart disease    IBS (irritable bowel syndrome)    Lupus (systemic lupus erythematosus) (HCC)    NAFLD (nonalcoholic fatty liver disease)    Osteoporosis    Pacemaker    Personal history colonic adenoma 03/25/2008   06/2007 right sided adenoma and 2 right hyperplastic polyps     Restless leg syndrome    questionable   Sleep apnea    Systemic lupus erythematosus (HCC)    Tubular adenoma of colon    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    Past Surgical History:  Procedure Laterality Date   ANTERIOR CERVICAL DECOMP/DISCECTOMY FUSION N/A 09/18/2022   Procedure: Anterior Cervical Discectomy Fusion Cervical Four-Cervical Five, Cervical Five-Cervical Six;  Surgeon: Onetha Kuba, MD;  Location: [facility] OR;  Service: Neurosurgery;  Laterality: N/A;   CARDIOVERSION N/A 04/08/2023   Procedure: CARDIOVERSION;  Surgeon: Loni Soyla LABOR, MD;  Location: [facility] INVASIVE CV LAB;  Service: Cardiovascular;  Laterality: N/A;   CHOLECYSTECTOMY     CHOLECYSTECTOMY, LAPAROSCOPIC     COLONOSCOPY     ESOPHAGOGASTRODUODENOSCOPY     lesion, vulva excision     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   Procedure: PACEMAKER IMPLANT;  Surgeon: Waddell Danelle ORN, MD;  Location: [facility] INVASIVE CV LAB;  Service: Cardiovascular;  Laterality: N/A;   TONSILLECTOMY      TRANSESOPHAGEAL ECHOCARDIOGRAM (CATH LAB) N/A 04/08/2023   Procedure: TRANSESOPHAGEAL ECHOCARDIOGRAM;  Surgeon: Loni Soyla LABOR, MD;  Location: [facility] INVASIVE CV LAB;  Service: Cardiovascular;  Laterality: N/A;   TUBAL LIGATION  1977   VIDEO BRONCHOSCOPY Bilateral 11/06/2017   Procedure: VIDEO BRONCHOSCOPY WITHOUT FLUORO;  Surgeon: Mannam, Praveen, MD;  Location: [facility] ENDOSCOPY;  Service: Cardiopulmonary;  Laterality: Bilateral;   Social History:  reports that she has been smoking cigarettes. She started smoking about 33 years ago. She has a 7.5 pack-year smoking history. She has never used smokeless tobacco. She reports that she does not currently use alcohol after a past usage of about 5.0 standard drinks of alcohol per week. She reports that she does not use drugs.  No Known Allergies  Family History  Problem Relation Age of Onset   Stroke Mother        Deceased, 45s   Atrial fibrillation Father    Colon cancer Father    Dementia Father    Dementia Sister    Other Sister        Pick's disease, deceased   Healthy Daughter    Vascular Disease Other    Other Other        PIK's disease    Prior to Admission medications   Medication Sig Start Date End Date Taking? Authorizing Provider  acetaminophen  (TYLENOL ) 325 MG tablet Take 650 mg by mouth every 6 (six) hours as needed for moderate pain or mild pain.    [provider]  albuterol  (VENTOLIN  HFA) 108 (90 Base) MCG/ACT inhaler Inhale 1-2 puffs into the lungs every 6 (six) hours as needed for wheezing or shortness of breath. 02/20/23   Hunsucker, Donnice SAUNDERS, MD  apixaban  (ELIQUIS ) 5 MG TABS tablet Take 1 tablet (5 mg total) by mouth 2 (two) times daily. 06/17/23   Acharya, Gayatri A, MD  Bacillus Coagulans-Inulin (PROBIOTIC-PREBIOTIC PO) Take by mouth. Bio Complete    [provider]  benzonatate  (TESSALON ) 100 MG capsule Take 1 capsule (100 mg total) by mouth every 6 (six) hours as needed for cough. 10/06/23   Hunsucker,  Donnice SAUNDERS, MD  CALCIUM  PO Take 600 mg by mouth.    [provider]  Cholecalciferol  (VITAMIN D ) 50 MCG (2000 UT) tablet Take 4,000 Units by mouth every evening.    [provider]  DM-APAP-CPM (CORICIDIN HBP PO) Take 5 mLs by mouth every 6 (six) hours as needed (congestion).    [provider]  empagliflozin  (JARDIANCE ) 10 MG TABS tablet TAKE 1 TABLET BY MOUTH DAILY BEFORE BREAKFAST. 06/25/23   Acharya, Gayatri A, MD  fluticasone  (FLONASE ) 50 MCG/ACT nasal spray SPRAY 2 SPRAYS INTO EACH NOSTRIL EVERY DAY Patient taking differently: Place 2 sprays into both nostrils daily as needed for allergies. 10/22/22   Hunsucker, Donnice SAUNDERS, MD  furosemide  (LASIX ) 20 MG tablet TAKE 1 TABLET (20 MG) BY MOUTH DAILY, TAKE 1 EXTRA TABLET BY MOUTH (20 MG) AS NEEDED FOR INCREASED SWELLING. 10/13/23   Loni Soyla LABOR, MD  gabapentin  (NEURONTIN ) 300 MG capsule TAKE 2 CAPSULES BY MOUTH EVERY MORNING, 2 CAPSULES  EACH AFTERNOON AND 3 CAPSULES AT BEDTIME 07/08/23   Rollene Almarie LABOR, MD  guaiFENesin  (MUCINEX ) 600 MG 12 hr tablet Take 600 mg by mouth 2 (two) times daily as needed (congestion).    [provider]  MAGNESIUM  COMPLEX HIGH POTENCY PO Take 250 mg by mouth.    [provider]  metoprolol  succinate (TOPROL -XL) 25 MG 24 hr tablet TAKE 1 TABLET (25 MG TOTAL) BY MOUTH DAILY. 03/13/23   Acharya, Gayatri A, MD  Misc Natural Products (ADVANCED JOINT RELIEF) CAPS Take 1 capsule by mouth daily. Joint Advantage Gold 5x    [provider]  Multiple Vitamin (MULTIVITAMIN WITH MINERALS) TABS tablet Take 1 tablet by mouth daily.    [provider]  predniSONE  (DELTASONE ) 20 MG tablet AS NEEDED IF BRONCHITIS RECURS. TAKE 40 MG ONCE DAILY FOR 5 DAYS. 07/23/23   Hunsucker, Donnice SAUNDERS, MD  RESTASIS  0.05 % ophthalmic emulsion Place 1 drop into both eyes 3 (three) times daily. 07/30/17   [provider]  triamcinolone  cream (KENALOG ) 0.1 % Apply 1 Application  topically daily as needed (irritation).    [provider]  TURMERIC CURCUMIN PO Take by mouth.    [provider]  umeclidinium-vilanterol (ANORO ELLIPTA ) 62.5-25 MCG/ACT AEPB Inhale 1 puff into the lungs daily. 02/12/23   Hunsucker, Donnice SAUNDERS, MD  vitamin C  (ASCORBIC ACID ) 500 MG tablet Take 1,000 mg by mouth daily.    [provider]  VITAMIN E  PO Take 150 mg by mouth daily.    [provider]  zolpidem  (AMBIEN ) 5 MG tablet TAKE 1 TABLET BY MOUTH AT BEDTIME AS NEEDED FOR SLEEP 05/19/23   Rollene Almarie LABOR, MD    Physical Exam: Vitals:   10/24/23 0400 10/24/23 0430 10/24/23 0538 10/24/23 0750  BP: 129/74 111/70 127/72 122/72  Pulse: 72 71 85 65  Resp: 10 14 18 18   Temp:   98 F (36.7 C) 97.7 F (36.5 C)  TempSrc:    Oral  SpO2: 97% 96% 97% 95%  Weight:   56 kg   Height:   5' 2 (1.575 m)    Constitutional: Elderly female currently in no acute distress Eyes: PERRL, lids and conjunctivae normal ENMT: Mucous membranes are moist.  Normal dentition.  Neck: normal, supple  Respiratory: Decreased overall aeration.  Mild crackles appreciated.  No significant wheezes.  Patient able to talk in short sentences. Cardiovascular: Regular rate and rhythm, no murmurs / rubs / gallops. No extremity edema. 2+ pedal pulses.  Abdomen: no tenderness, no masses palpated. No hepatosplenomegaly. Bowel sounds positive.  Musculoskeletal: no clubbing / cyanosis. No joint deformity upper and lower extremities. Normal muscle tone.  Skin: no rashes, lesions, ulcers. No induration Neurologic: CN 2-12 grossly intact. Strength 5/5 in all 4.  Psychiatric: Normal judgment and insight. Alert and oriented x 3. Normal mood.   Data Reviewed:  EKG revealed sinus tachycardia 104 bpm with supraventricular bigeminy.  Reviewed labs, imaging, and pertinent records as documented.  Assessment and Plan:  Possible pneumonia Patient presented with complaints of cough and worsening  shortness of breath.  Chest x-ray showed diffuse interstitial opacities throughout both lungs.  Influenza, COVID-19, and RSV screening were negative.  Patient had been started on empiric antibiotics with Rocephin  and azithromycin .  On - Continuous pulse oximetry with oxygen  maintain O2 saturations greater than 90% - Incentive spirometry and flutter valve - Check procalcitonin and BNP - Continue empiric antibiotics of Rocephin  and azithromycin  - Mucinex   Diastolic congestive heart failure  Possibly acute on chronic.  Patient reports that she had not had increased lower extremity swelling or weight gain.  On physical exam patient does not appear grossly fluid overloaded.  Last echocardiogram noted EF to be 65-70% with indeterminate diastolic parameters. -Strict I&Os and daily weights - Check BNP (864.4) - Lasix  20 mg IV x 1 dose.  Reassess and continue or adjust diuresis in a.m.  COPD exacerbation Interstitial lung disease She reports that she had been wheezing. - Continue scheduled breathing treatments and as needed - Prednisone   Elevated troponin Chronic.  High-sensitivity troponins 27->25.  Noted to be flat and similar to prior elevation during previous hospitalization.  Suspect secondary to demand. - Continue to monitor   Sinus node dysfunction s/p pacemaker Paroxysmal atrial fibrillation - Continue metoprolol  and Eliquis   Hyponatremia Chronic.  Sodium noted to be 133 which appears around patient's baseline.  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 - Continue to monitor  CKD stage IIIa Creatinine 1.2 which appears around patient's baseline. - Continue to monitor kidney function  Peripheral neuropathy - Continue gabapentin   DVT prophylaxis: Eliquis  Advance Care Planning:   Code Status: Full Code    Consults: None  Family Communication: Husband updated at bedside Severity of Illness: The appropriate patient status for this patient is OBSERVATION. Observation status  is judged to be reasonable and necessary in order to provide the required intensity of service to ensure the patient's safety. The patient's presenting symptoms, physical exam findings, and initial radiographic and laboratory data in the context of their medical condition is felt to place them at decreased risk for further clinical deterioration. Furthermore, it is anticipated that the patient will be medically stable for discharge from the hospital within 2 midnights of admission.   Author: Maximino DELENA Sharps, MD 10/24/2023 8:05 AM  For on call review [email or web address].

## 2023-10-24 NOTE — Plan of Care (Signed)
 Plan of Care Note for accepted transfer   Patient name: Sheila Morales FMW:[id number] DOB: Apr 19, 1951  Facility requesting transfer: [facility] ED Requesting Provider: Dr. Midge Facility course: 73 year old female with history of A-fib status post pacemaker, HFpEF, COPD, ILD, lupus, OSA presented to the ED with shortness of breath, cough, and wheezing.  Labs notable for WBC count 6.4, COVID/influenza/RSV PCR negative, initial troponin 27 and repeat pending.  EKG without STEMI.  ABG with pH 7.31, PCO2 42.9, and PO295.  Chest x-ray showing diffuse interstitial opacities throughout both lungs suspicious for viral or atypical infection versus interstitial edema versus changes of underlying interstitial lung disease.  Requiring 2 L [place] to maintain sats in the 90s.  Patient was given albuterol , DuoNeb, Solu-Medrol , ceftriaxone , azithromycin , and IV mag.  Plan of care: The patient is accepted for admission to Telemetry unit at [facility].  [facility] will assume care on arrival to accepting facility. Until arrival, care as per EDP. However, [facility] available 24/7 for questions and assistance.  Check [email or web address] for on-call coverage.  Nursing staff, please call [facility] Admits & Consults System-Wide number under Amion on patient's arrival so appropriate admitting provider can evaluate the pt.

## 2023-10-24 NOTE — Plan of Care (Signed)

## 2023-10-24 NOTE — Progress Notes (Signed)
   10/24/23 2018  BiPAP/CPAP/SIPAP  BiPAP/CPAP/SIPAP Pt Type Adult  Reason BIPAP/CPAP not in use Non-compliant  BiPAP/CPAP /SiPAP Vitals  Pulse Rate 64  Resp 20  SpO2 100 %  Bilateral Breath Sounds Clear;Diminished  MEWS Score/Color  MEWS Score 0  MEWS Score Color [place]

## 2023-10-24 NOTE — Progress Notes (Signed)
 Pt arrived to 2W36 via [facility] from [facility]. [facility] Admits paged to notify of patient's arrival. Vitals stable, care ongoing.

## 2023-10-24 NOTE — Evaluation (Signed)
 Physical Therapy Brief Evaluation and Discharge Note Patient Details Name: Sheila Morales MRN: [id number] DOB: February 09, 1951 Today's Date: 10/24/2023   History of Present Illness  Sheila Morales is a 73 y.o. female who presents to the ED with complaint of cough, dyspnea. Past medical history as below, significant for A-fib, CHF, COPD, interstitial lung disease, lupus, pacemaker.  Clinical Impression  Sheila Morales presents with admitting diagnosis above. Sheila Morales today was able to ambulate in hallway independently. SpO2: 98% on RA during ambulation. PTA Sheila Morales was fully independent. Sheila Morales presents at or near baseline mobility. Sheila Morales has no further acute Sheila Morales needs and will be signing off. Re consult Sheila Morales if mobility status changes. Sheila Morales would benefit from continued mobility with mobility specialist during acute stay.        Sheila Morales Assessment Patient does not need any further Sheila Morales services  Assistance Needed at Discharge  PRN    Equipment Recommendations None recommended by Sheila Morales  Recommendations for Other Services       Precautions/Restrictions Precautions Precautions: Fall Recall of Precautions/Restrictions: Intact Restrictions Weight Bearing Restrictions Per Provider Order: No        Mobility  Bed Mobility       General bed mobility comments: Up in recliner  Transfers Overall transfer level: Independent Equipment used: None                    Ambulation/Gait Ambulation/Gait assistance: Independent Gait Distance (Feet): 250 Feet Assistive device: None Gait Pattern/deviations: Wide base of support, Decreased stride length, Step-through pattern Gait Speed: Pace WFL General Gait Details: Sheila Morales with a waddling gait pattern however reports that this is baseline for her. no LOB noted.  Home Activity Instructions    Stairs            Modified Rankin (Stroke Patients Only)        Balance Overall balance assessment: No apparent balance deficits (not formally assessed)                         Pertinent Vitals/Pain Sheila Morales - Brief Vital Signs All Vital Signs Stable: Yes Pain Assessment Pain Assessment: No/denies pain     Home Living Family/patient expects to be discharged to:: Private residence Living Arrangements: Spouse/significant other Available Help at Discharge: Family;Available 24 hours/day Home Environment: Stairs to enter;No rail  Stairs-Number of Steps: 2 Home Equipment: Rollator (4 wheels);Rolling Walker (2 wheels);Shower seat        Prior Function Level of Independence: Independent      UE/LE Assessment   UE ROM/Strength/Tone/Coordination: WFL    LE ROM/Strength/Tone/Coordination: [facility]      Communication   Communication Communication: No apparent difficulties     Cognition Overall Cognitive Status: Appears within functional limits for tasks assessed/performed       General Comments General comments (skin integrity, edema, etc.): SpO2 98% on RA    Exercises     Assessment/Plan    Sheila Morales Problem List         Sheila Morales Visit Diagnosis Other abnormalities of gait and mobility (R26.89)    No Skilled Sheila Morales Patient at baseline level of functioning;Patient is independent with all acitivity/mobility   Co-evaluation                AMPAC 6 Clicks Help needed turning from your back to your side while in a flat bed without using bedrails?: None Help needed moving from lying on your back to sitting on the side of a flat bed without  using bedrails?: None Help needed moving to and from a bed to a chair (including a wheelchair)?: None Help needed standing up from a chair using your arms (e.g., wheelchair or bedside chair)?: None Help needed to walk in hospital room?: None Help needed climbing 3-5 steps with a railing? : None 6 Click Score: 24      End of Session Equipment Utilized During Treatment: Gait belt Activity Tolerance: Patient tolerated treatment well Patient left: with call bell/phone within reach;in chair Nurse Communication: Mobility  status Sheila Morales Visit Diagnosis: Other abnormalities of gait and mobility (R26.89)     Time: 8647-8592 Sheila Morales Time Calculation (min) (ACUTE ONLY): 15 min  Charges:   Sheila Morales Evaluation $Sheila Morales Eval Low Complexity: 1 Low      Sheila Morales, Sheila Morales, Sheila Morales Acute Rehab Services [phone]   Sheila Morales  10/24/2023, 2:41 PM

## 2023-10-24 NOTE — Care Management Obs Status (Signed)
 MEDICARE OBSERVATION STATUS NOTIFICATION   Patient Details  Name: Sheila Morales MRN: [id number] Date of Birth: 06/22/1950   Medicare Observation Status Notification Given:  Yes    Aaisha Sliter 10/24/2023, 12:22 PM

## 2023-10-24 NOTE — ED Provider Notes (Signed)
 I assumed care at signout.  Plan was to reassess patient.  Overall work of breathing is improved, but with ambulation she became dyspneic and hypoxic on room air She reports she typically can walk without oxygen .  Patient will need to be admitted for respiratory failure.  Husband admits that the symptoms been ongoing for the past several weeks.  Patient is still smoking  Discussed with Dr. Al[place] for admission   Midge Golas, MD 10/24/23 [phone]

## 2023-10-24 NOTE — ED Notes (Signed)
 RT obtained ABG on pt while on [place] 2 Lpm w/the following results. MD Wickline notified of results.  Latest Reference Range & Units 10/24/23 01:59  Sample type  ARTERIAL  pH, Arterial 7.35 - 7.45  7.313 (L)  pCO2 arterial 32 - 48 mmHg 42.9  pO2, Arterial 83 - 108 mmHg 95  TCO2 22 - 32 mmol/L 23  Acid-base deficit 0.0 - 2.0 mmol/L 4.0 (H)  Bicarbonate 20.0 - 28.0 mmol/L 21.8  O2 Saturation % 97  Patient temperature  98.0 F  Sodium 135 - 145 mmol/L 134 (L)  Potassium 3.5 - 5.1 mmol/L 4.0  Calcium  Ionized 1.15 - 1.40 mmol/L 1.21  (L): Data is abnormally low (H): Data is abnormally high

## 2023-10-24 NOTE — TOC CM/SW Note (Signed)
 Transition of Care [facility]) - Inpatient Brief Assessment   Patient Details  Name: Sheila Morales MRN: [id number] Date of Birth: 08-15-1950  Transition of [facility]) CM/SW Contact:    Andrez JULIANNA George, RN Phone Number: 10/24/2023, 2:20 PM   Clinical Narrative:  Sheila Morales is a 73 y.o. female with past medical history as below, significant for A-fib, CHF, COPD, interstitial lung disease, lupus, pacemaker who presents to the ED with complaint of cough, dyspnea. Has been on oxygen  at home in the past but is not currently.  Transition of Care Asessment: Insurance and Status: Insurance coverage has been reviewed Patient has primary care physician: Yes Home environment has been reviewed: home with spouse   Prior/Current Home Services: No current home services Social Drivers of Health Review: SDOH reviewed no interventions necessary Readmission risk has been reviewed: Yes Transition of care needs: no transition of care needs at this time

## 2023-10-24 NOTE — ED Notes (Signed)
 Pt ambulated in hallway and lowest oxygen  saturation 82% on RA. Pt tachypneic and dyspneic during ambulation. MD made aware. Pt oxygen  back up to 96% upon resting on stretcher. Pt placed back on 2L [place].

## 2023-10-25 ENCOUNTER — Other Ambulatory Visit (HOSPITAL_COMMUNITY): Payer: Self-pay

## 2023-10-25 ENCOUNTER — Encounter (HOSPITAL_COMMUNITY): Payer: Self-pay | Admitting: Internal Medicine

## 2023-10-25 DIAGNOSIS — J189 Pneumonia, unspecified organism: Secondary | ICD-10-CM | POA: Diagnosis not present

## 2023-10-25 DIAGNOSIS — E871 Hypo-osmolality and hyponatremia: Secondary | ICD-10-CM | POA: Diagnosis not present

## 2023-10-25 DIAGNOSIS — N1831 Chronic kidney disease, stage 3a: Secondary | ICD-10-CM | POA: Diagnosis not present

## 2023-10-25 DIAGNOSIS — I480 Paroxysmal atrial fibrillation: Secondary | ICD-10-CM | POA: Diagnosis not present

## 2023-10-25 DIAGNOSIS — I5032 Chronic diastolic (congestive) heart failure: Secondary | ICD-10-CM | POA: Diagnosis not present

## 2023-10-25 DIAGNOSIS — J441 Chronic obstructive pulmonary disease with (acute) exacerbation: Secondary | ICD-10-CM | POA: Diagnosis not present

## 2023-10-25 LAB — CBC
HCT: 36.7 % (ref 36.0–46.0)
Hemoglobin: 12.4 g/dL (ref 12.0–15.0)
MCH: 30.8 pg (ref 26.0–34.0)
MCHC: 33.8 g/dL (ref 30.0–36.0)
MCV: 91.3 fL (ref 80.0–100.0)
Platelets: 176 K/uL (ref 150–400)
RBC: 4.02 MIL/uL (ref 3.87–5.11)
RDW: 13.7 % (ref 11.5–15.5)
WBC: 6.6 K/uL (ref 4.0–10.5)
nRBC: 0 % (ref 0.0–0.2)

## 2023-10-25 LAB — BASIC METABOLIC PANEL WITH GFR
Anion gap: 9 (ref 5–15)
BUN: 32 mg/dL — ABNORMAL HIGH (ref 8–23)
CO2: 23 mmol/L (ref 22–32)
Calcium: 8.8 mg/dL — ABNORMAL LOW (ref 8.9–10.3)
Chloride: 104 mmol/L (ref 98–111)
Creatinine, Ser: 1.12 mg/dL — ABNORMAL HIGH (ref 0.44–1.00)
GFR, Estimated: 52 mL/min — ABNORMAL LOW (ref >60)
Glucose, Bld: 126 mg/dL — ABNORMAL HIGH (ref 70–99)
Potassium: 5 mmol/L (ref 3.5–5.1)
Sodium: 136 mmol/L (ref 135–145)

## 2023-10-25 MED ORDER — AZITHROMYCIN 500 MG PO TABS
500.0000 mg | ORAL_TABLET | Freq: Every day | ORAL | 0 refills | Status: AC
  Filled 2023-10-25: qty 3, 3d supply, fill #0

## 2023-10-25 MED ORDER — FUROSEMIDE 20 MG PO TABS
20.0000 mg | ORAL_TABLET | Freq: Every day | ORAL | Status: DC
  Administered 2023-10-25: 20 mg via ORAL
  Filled 2023-10-25: qty 1

## 2023-10-25 MED ORDER — IPRATROPIUM-ALBUTEROL 0.5-2.5 (3) MG/3ML IN SOLN: 3.0000 mL | Freq: Two times a day (BID) | RESPIRATORY_TRACT | Status: DC

## 2023-10-25 MED ORDER — BENZONATATE 100 MG PO CAPS
100.0000 mg | ORAL_CAPSULE | Freq: Four times a day (QID) | ORAL | 1 refills | Status: DC | PRN
  Filled 2023-10-25: qty 90, 23d supply, fill #0

## 2023-10-25 NOTE — Discharge Summary (Signed)
 Physician Discharge Summary   Patient: Sheila Morales MRN: [id number] DOB: May 09, 1950  Admit date:     10/23/2023  Discharge date: {dischdate:26783}  Discharge Physician: Concepcion Riser   PCP: Clarice Nottingham, MD   Recommendations at discharge:  {Tip this will not be part of the note when signed- Example include specific recommendations for outpatient follow-up, pending tests to follow-up on. (Optional):26781}  ***  Discharge Diagnoses: Principal Problem:   PNA (pneumonia) Active Problems:   Chronic diastolic CHF (congestive heart failure) (HCC)   Interstitial lung disease (HCC)   COPD with acute exacerbation (HCC)   Elevated troponin   Pacemaker   AF (paroxysmal atrial fibrillation) (HCC)   Hyponatremia   CKD (chronic kidney disease), stage III (HCC)   Peripheral neuropathy  Resolved Problems:   * No resolved hospital problems. [facility] Course: No notes on file  Assessment and Plan: No notes have been filed under this hospital service. Service: Hospitalist     {Tip this will not be part of the note when signed Body mass index is 23.39 kg/m. , ,  (Optional):26781}  {(NOTE) Pain control PDMP Statment (Optional):26782} Consultants: *** Procedures performed: ***  Disposition: {Plan; Disposition:26390} Diet recommendation:  Discharge Diet Orders (From admission, onward)     Start     Ordered   10/25/23 0000  Diet - low sodium heart healthy        10/25/23 1322           {Diet_Plan:26776} DISCHARGE MEDICATION: Allergies as of 10/25/2023   No Known Allergies      Medication List     TAKE these medications    acetaminophen  325 MG tablet Commonly known as: TYLENOL  Take 650 mg by mouth every 6 (six) hours as needed for moderate pain or mild pain.   Advanced Joint Relief Caps Take 1 capsule by mouth daily. Joint Advantage Gold 5x   albuterol  108 (90 Base) MCG/ACT inhaler Commonly known as: VENTOLIN  HFA Inhale 1-2 puffs into the lungs every 6  (six) hours as needed for wheezing or shortness of breath.   Anoro Ellipta  62.5-25 MCG/ACT Aepb Generic drug: umeclidinium-vilanterol Inhale 1 puff into the lungs daily.   apixaban  5 MG Tabs tablet Commonly known as: ELIQUIS  Take 1 tablet (5 mg total) by mouth 2 (two) times daily.   ascorbic acid  500 MG tablet Commonly known as: VITAMIN C  Take 1,000 mg by mouth daily.   azithromycin  500 MG tablet Commonly known as: Zithromax  Take 1 tablet (500 mg total) by mouth daily for 3 days.   benzonatate  100 MG capsule Commonly known as: TESSALON  Take 1 capsule (100 mg total) by mouth every 6 (six) hours as needed for cough.   CALCIUM  PO Take 600 mg by mouth.   CORICIDIN HBP PO Take 5 mLs by mouth every 6 (six) hours as needed (congestion).   denosumab 60 MG/ML Sosy injection Commonly known as: PROLIA Inject 60 mg into the skin every 6 (six) months.   fluticasone  50 MCG/ACT nasal spray Commonly known as: FLONASE  SPRAY 2 SPRAYS INTO EACH NOSTRIL EVERY DAY What changed: See the new instructions.   furosemide  20 MG tablet Commonly known as: LASIX  TAKE 1 TABLET (20 MG) BY MOUTH DAILY, TAKE 1 EXTRA TABLET BY MOUTH (20 MG) AS NEEDED FOR INCREASED SWELLING.   gabapentin  300 MG capsule Commonly known as: NEURONTIN  TAKE 2 CAPSULES BY MOUTH EVERY MORNING, 2 CAPSULES EACH AFTERNOON AND 3 CAPSULES AT BEDTIME What changed:  how much to take how to  take this when to take this additional instructions   guaiFENesin  600 MG 12 hr tablet Commonly known as: MUCINEX  Take 600 mg by mouth 2 (two) times daily as needed (congestion).   Jardiance  10 MG Tabs tablet Generic drug: empagliflozin  TAKE 1 TABLET BY MOUTH DAILY BEFORE BREAKFAST.   MAGNESIUM  COMPLEX HIGH POTENCY PO Take 250 mg by mouth.   metoprolol  succinate 25 MG 24 hr tablet Commonly known as: TOPROL -XL TAKE 1 TABLET (25 MG TOTAL) BY MOUTH DAILY.   multivitamin with minerals Tabs tablet Take 1 tablet by mouth daily.    predniSONE  20 MG tablet Commonly known as: DELTASONE  AS NEEDED IF BRONCHITIS RECURS. TAKE 40 MG ONCE DAILY FOR 5 DAYS. What changed: See the new instructions.   PROBIOTIC-PREBIOTIC PO Take 1 capsule by mouth daily at 12 noon. Bio Complete   Restasis  0.05 % ophthalmic emulsion Generic drug: cycloSPORINE  Place 1 drop into both eyes 3 (three) times daily.   triamcinolone  cream 0.1 % Commonly known as: KENALOG  Apply 1 Application topically daily as needed (irritation).   TURMERIC CURCUMIN PO Take 1 capsule by mouth daily at 12 noon.   Vitamin D  50 MCG (2000 UT) tablet Take 4,000 Units by mouth every evening.   VITAMIN E  PO Take 150 mg by mouth daily.   zolpidem  5 MG tablet Commonly known as: AMBIEN  TAKE 1 TABLET BY MOUTH AT BEDTIME AS NEEDED FOR SLEEP What changed:  reasons to take this additional instructions        Discharge Exam: Filed Weights   10/24/23 0538 10/25/23 0447  Weight: 56 kg 58 kg   ***  Condition at discharge: {DC Condition:26389}  The results of significant diagnostics from this hospitalization (including imaging, microbiology, ancillary and laboratory) are listed below for reference.   Imaging Studies: DG Chest Portable 1 View Result Date: 10/23/2023 CLINICAL DATA:  SOB EXAM: PORTABLE CHEST - 1 VIEW COMPARISON:  None available. FINDINGS: Diffuse interstitial opacities throughout both lungs. No focal airspace consolidation, pleural effusion, or pneumothorax. Mild cardiomegaly. Left chest pacemaker with leads terminating in the right atrium and right ventricle. Tortuous aorta with aortic atherosclerosis. No acute fracture or destructive lesions. Multilevel thoracic osteophytosis. Cervical fusion hardware partially visualized. IMPRESSION: Diffuse interstitial opacities throughout both lungs, which may represent viral or atypical infection or interstitial edema. Alternatively, this could reflect changes of underlying interstitial lung disease.  Electronically Signed   By: Rogelia Myers M.D.   On: 10/23/2023 22:49    Microbiology: Results for orders placed or performed during the hospital encounter of 10/23/23  Resp panel by RT-PCR (RSV, Flu A&B, Covid) Anterior Nasal Swab     Status: None   Collection Time: 10/23/23 11:28 PM   Specimen: Anterior Nasal Swab  Result Value Ref Range Status   SARS Coronavirus 2 by RT PCR NEGATIVE NEGATIVE Final    Comment: (NOTE) SARS-CoV-2 target nucleic acids are NOT DETECTED.  The SARS-CoV-2 RNA is generally detectable in upper respiratory specimens during the acute phase of infection. The lowest concentration of SARS-CoV-2 viral copies this assay can detect is 138 copies/mL. A negative result does not preclude SARS-Cov-2 infection and should not be used as the sole basis for treatment or other patient management decisions. A negative result may occur with  improper specimen collection/handling, submission of specimen other than nasopharyngeal swab, presence of viral mutation(s) within the areas targeted by this assay, and inadequate number of viral copies(<138 copies/mL). A negative result must be combined with clinical observations, patient history, and epidemiological information.  The expected result is Negative.  Fact Sheet for Patients:  BloggerCourse.com  Fact Sheet for Healthcare Providers:  SeriousBroker.it  This test is no t yet approved or cleared by the [place]  FDA and  has been authorized for detection and/or diagnosis of SARS-CoV-2 by FDA under an Emergency Use Authorization (EUA). This EUA will remain  in effect (meaning this test can be used) for the duration of the COVID-19 declaration under Section 564(b)(1) of the Act, 21 U.S.C.section 360bbb-3(b)(1), unless the authorization is terminated  or revoked sooner.       Influenza A by PCR NEGATIVE NEGATIVE Final   Influenza B by PCR NEGATIVE NEGATIVE Final     Comment: (NOTE) The Xpert Xpress SARS-CoV-2/FLU/RSV plus assay is intended as an aid in the diagnosis of influenza from Nasopharyngeal swab specimens and should not be used as a sole basis for treatment. Nasal washings and aspirates are unacceptable for Xpert Xpress SARS-CoV-2/FLU/RSV testing.  Fact Sheet for Patients: BloggerCourse.com  Fact Sheet for Healthcare Providers: SeriousBroker.it  This test is not yet approved or cleared by the [place]  FDA and has been authorized for detection and/or diagnosis of SARS-CoV-2 by FDA under an Emergency Use Authorization (EUA). This EUA will remain in effect (meaning this test can be used) for the duration of the COVID-19 declaration under Section 564(b)(1) of the Act, 21 U.S.C. section 360bbb-3(b)(1), unless the authorization is terminated or revoked.     Resp Syncytial Virus by PCR NEGATIVE NEGATIVE Final    Comment: (NOTE) Fact Sheet for Patients: BloggerCourse.com  Fact Sheet for Healthcare Providers: SeriousBroker.it  This test is not yet approved or cleared by the [place]  FDA and has been authorized for detection and/or diagnosis of SARS-CoV-2 by FDA under an Emergency Use Authorization (EUA). This EUA will remain in effect (meaning this test can be used) for the duration of the COVID-19 declaration under Section 564(b)(1) of the Act, 21 U.S.C. section 360bbb-3(b)(1), unless the authorization is terminated or revoked.  Performed at [facility], [address] [place], [place] [address]     Labs: CBC: Recent Labs  Lab 10/23/23 2230 10/24/23 0159 10/25/23 0523  WBC 6.4  --  6.6  HGB 13.6 12.9 12.4  HCT 41.6 38.0 36.7  MCV 90.2  --  91.3  PLT 158  --  176   Basic Metabolic Panel: Recent Labs  Lab 10/23/23 2230 10/24/23 0159 10/25/23 0523  NA 133* 134* 136  K 4.2 4.0 5.0  CL 97*   --  104  CO2 27  --  23  GLUCOSE 88  --  126*  BUN 24*  --  32*  CREATININE 1.20*  --  1.12*  CALCIUM  9.3  --  8.8*   Liver Function Tests: No results for input(s): AST, ALT, ALKPHOS, BILITOT, PROT, ALBUMIN  in the last 168 hours. CBG: Recent Labs  Lab 10/24/23 1242 10/24/23 2024  GLUCAP 169* 174*    Discharge time spent: {LESS THAN/GREATER THAN:26388} 30 minutes.  Signed: Concepcion Riser, MD [facility] 10/25/2023

## 2023-10-28 ENCOUNTER — Ambulatory Visit: Attending: Cardiology | Admitting: Physician Assistant

## 2023-10-29 ENCOUNTER — Encounter: Payer: Self-pay | Admitting: Physician Assistant

## 2023-10-30 DIAGNOSIS — Z9109 Other allergy status, other than to drugs and biological substances: Secondary | ICD-10-CM | POA: Diagnosis not present

## 2023-10-30 DIAGNOSIS — Z09 Encounter for follow-up examination after completed treatment for conditions other than malignant neoplasm: Secondary | ICD-10-CM | POA: Diagnosis not present

## 2023-10-30 DIAGNOSIS — J432 Centrilobular emphysema: Secondary | ICD-10-CM | POA: Diagnosis not present

## 2023-11-10 ENCOUNTER — Encounter: Payer: Self-pay | Admitting: Pulmonary Disease

## 2023-11-10 DIAGNOSIS — G4733 Obstructive sleep apnea (adult) (pediatric): Secondary | ICD-10-CM

## 2023-11-10 DIAGNOSIS — R7989 Other specified abnormal findings of blood chemistry: Secondary | ICD-10-CM

## 2023-11-11 NOTE — Telephone Encounter (Signed)
 Spoke to patient and relayed below message/results.  She agrees with plan.  Order has been placed to [facility] per patient request.  Pt will call back to schedule OV after being setup on cpap. Nothing further needed  at this time

## 2023-11-11 NOTE — Telephone Encounter (Signed)
 I see the result in the notes tab - optimal pressure is 13 cm H2O - please send new CPAP orders to company other than [facility].  Please let patient know optimal pressure is 13 cn H2O and new orders will be sent.  Thanks!

## 2023-11-14 ENCOUNTER — Other Ambulatory Visit: Payer: Self-pay | Admitting: Internal Medicine

## 2023-11-15 ENCOUNTER — Other Ambulatory Visit: Payer: Self-pay | Admitting: Internal Medicine

## 2023-11-17 ENCOUNTER — Telehealth: Payer: Self-pay | Admitting: Internal Medicine

## 2023-11-17 ENCOUNTER — Telehealth: Payer: Self-pay | Admitting: Pharmacist

## 2023-11-17 NOTE — Progress Notes (Signed)
 Pharmacy Quality Measure Review  This patient is appearing on a report for being at risk of failing the adherence measure for diabetes medications this calendar year.   Medication: Jardiance  Last fill date: 09/26/23 for 90 day supply  Insurance report was not up to date. No action needed at this time.   Also of note, pt is no longer pt of [facility]. PCP changed to Dr. Clarice.  Darrelyn Drum, PharmD, BCPS, CPP Clinical Pharmacist Practitioner [facility] [phone]

## 2023-11-17 NOTE — Telephone Encounter (Signed)
LM for pt to let us know

## 2023-11-17 NOTE — Telephone Encounter (Signed)
 Pt might be seeing [facility] Dr. Clarice now?   LOV: 02/27/23 Last fill: 05/19/23, 90 tablet 1 refill

## 2023-11-17 NOTE — Telephone Encounter (Signed)
 Copied from CRM [id number]. Topic: General - Other >> Nov 17, 2023 12:14 PM [place] J wrote: Reason for CRM: Patient is returning a missed call from [facility] and wanted to let her know that she is no longer seeing Dr. Rollene and is see Dr. Clarice.

## 2023-11-20 ENCOUNTER — Ambulatory Visit (HOSPITAL_COMMUNITY)

## 2023-11-20 ENCOUNTER — Encounter (HOSPITAL_COMMUNITY): Payer: Self-pay

## 2023-11-24 ENCOUNTER — Ambulatory Visit: Payer: PPO

## 2023-11-24 DIAGNOSIS — I495 Sick sinus syndrome: Secondary | ICD-10-CM | POA: Diagnosis not present

## 2023-11-24 LAB — CUP PACEART REMOTE DEVICE CHECK
Date Time Interrogation Session: 20250804130033
Implantable Lead Connection Status: 753985
Implantable Lead Connection Status: 753985
Implantable Lead Implant Date: 20220204
Implantable Lead Implant Date: 20220204
Implantable Lead Location: 753859
Implantable Lead Location: 753860
Implantable Lead Model: 377169
Implantable Lead Model: 377171
Implantable Lead Serial Number: 8000143637
Implantable Lead Serial Number: 8000151447
Implantable Pulse Generator Implant Date: 20220204
Pulse Gen Model: 407145
Pulse Gen Serial Number: 70020732

## 2023-11-25 ENCOUNTER — Ambulatory Visit: Payer: Self-pay | Admitting: Internal Medicine

## 2023-11-26 ENCOUNTER — Encounter: Payer: Self-pay | Admitting: Pulmonary Disease

## 2023-11-26 DIAGNOSIS — G4733 Obstructive sleep apnea (adult) (pediatric): Secondary | ICD-10-CM

## 2023-11-26 NOTE — Progress Notes (Unsigned)
 Cardiology Office Note:  .   Date:  11/26/2023  ID:  Sheila Morales, DOB November 07, 1950, MRN [id number] PCP: Clarice Nottingham, MD  [facility] Providers Cardiologist:  Soyla DELENA Merck, MD Electrophysiologist:  Danelle Birmingham, MD {  History of Present Illness: .   Sheila Morales is a 73 y.o. 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 Coli bacteremia.  During that admission she was found to be in new onset atrial fibrillation and acute HFpEF exacerbation requiring BiPAP and ICU admission.  admitted in January 2023 with fever/confusion/lethargy. During that admission she was noted to be anemic to 7.3 (on eliquis ). 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 Birmingham 05/10/22, back to work, doing OK, some swelling reported and DOE with stairs Discussed amio for AFib had been stopped (but was on the list)  Following with Drs Merck and Gardenia  Seeing Dr. Gardenia kussmaul 08/12/22, feeling better, better exertional capacity Doing great on Jardiance , volume stable No changes mad Maintained on amiodarone  and Eliquis   .....  TEE/DCCV 04/08/23  Saw Dr. Merck on 05/05/23, maintaining SR Volume better off plaquenil  Inconsistent CPAP use, but improving  Today's visit is scheduled as an annual device visit ROS:   She is accompanied by her husband. Follows with her pulmonologist and rhumatologist regularly, both do labs As well as her PMD  No CP, palpitations or cardiac awareness No rest SOB She has chronic/baseline DOE, not changing. Sleep apnea, pending replacement equipment Tired during the day, not a lot of energy, but not new or changing No near syncope or syncope No bleeding or signs of bleeding  Device information Biotronik dual chamber PPM implanted  05/26/20  Arrhythmia/AAD hx Afib found during hospitalization w/Ecoli bacteremia, Nov 2022 > amiodarone   Studies Reviewed: SABRA    EKG not done today  DEVICE interrogation done today and reviewed by myself Battery and lead measurements are good +PATs, fairly infrequent 4-6 seconds No AFib No programming changes made  04/08/23: TEE/DCCV 1. Left ventricular ejection fraction, by estimation, is 60 to 65%. The  left ventricle has normal function.   2. Right ventricular systolic function mild-moderately reduced. The right  ventricular size is normal.   3. Left atrial size was severely dilated. No left atrial/left atrial  appendage thrombus was detected.   4. Right atrial size was severely dilated.   5. The mitral valve is degenerative. Moderate mitral valve regurgitation.  No evidence of mitral stenosis.   6. Tricuspid valve regurgitation is moderate.   7. The aortic valve is tricuspid. There is mild calcification of the  aortic valve. Aortic valve regurgitation is not visualized.   8. There is mild (Grade II) plaque involving the aortic arch and  descending aorta.   9. Evidence of atrial level shunting detected by color flow Doppler.  Agitated saline contrast bubble study was negative, with no evidence of  any interatrial shunt. There is a small patent foramen ovale with  predominantly left to right shunting across  the atrial septum.   Conclusion(s)/Recommendation(s): No LA/LAA thrombus identified. Successful  cardioversion performed with restoration of normal sinus rhythm.     ECHO: 03/22/21: LVEF 60-65%, LVH; Grade III diastolic dysfunction; normal RV function as per my read.  01/10/22: LVEF 65%, normal RV function.  CMR:  09/26/2021: 1. Normal biventricular chamber size and function, LVEF 57%, RVEF 58%.  2. There is post contrast delayed myocardial enhancement. Midmyocardial circumferential stripe of delayed enhancement at the base. Midmyocardial delayed enhancement in the  lateral wall at the mid ventricle. Findings are nonspecific. Overall, findings may represent hypertensive heart disease. However, patient is chronically on plaquenil  and this constellation of findings in combination with clinical presentation does not exclude plaquenil  cardiomyopathy.   Risk Assessment/Calculations:    Physical Exam:   VS:  There were no vitals taken for this visit.   Wt Readings from Last 3 Encounters:  10/25/23 127 lb 13.9 oz (58 kg)  10/09/23 123 lb (55.8 kg)  09/30/23 123 lb (55.8 kg)    GEN: Well nourished, well developed in no acute distress NECK: No JVD; No carotid bruits CARDIAC: RRR, no murmurs, rubs, gallops RESPIRATORY:  CTA b/l without rales, wheezing or rhonchi  ABDOMEN: Soft, non-tender, non-distended EXTREMITIES: No edema; No deformity   PPM site: is stable, no thinning, fluctuation, tethering  ASSESSMENT AND PLAN: .    PPM intact function No programming changes made   ATach low burden Asymptomatic very short episodes  Paroxysmal Afib CHA2DS2Vasc is 3, on Eliquis , appropriately dosed Zero % burden  Secondary hypercoagulable state   Dispo: remotes as usual, back in a year again, sooner if needed  Signed, Charlies Macario Arthur, PA-C

## 2023-11-27 ENCOUNTER — Encounter: Payer: Self-pay | Admitting: Physician Assistant

## 2023-11-27 ENCOUNTER — Ambulatory Visit: Attending: Physician Assistant | Admitting: Physician Assistant

## 2023-11-27 VITALS — BP 130/70 | HR 67 | Ht 62.0 in | Wt 122.0 lb

## 2023-11-27 DIAGNOSIS — D6869 Other thrombophilia: Secondary | ICD-10-CM

## 2023-11-27 DIAGNOSIS — I480 Paroxysmal atrial fibrillation: Secondary | ICD-10-CM | POA: Diagnosis not present

## 2023-11-27 DIAGNOSIS — I4719 Other supraventricular tachycardia: Secondary | ICD-10-CM | POA: Diagnosis not present

## 2023-11-27 DIAGNOSIS — Z950 Presence of cardiac pacemaker: Secondary | ICD-10-CM | POA: Diagnosis not present

## 2023-11-27 LAB — CUP PACEART INCLINIC DEVICE CHECK
Date Time Interrogation Session: 20250807121513
Implantable Lead Connection Status: 753985
Implantable Lead Connection Status: 753985
Implantable Lead Implant Date: 20220204
Implantable Lead Implant Date: 20220204
Implantable Lead Location: 753859
Implantable Lead Location: 753860
Implantable Lead Model: 377169
Implantable Lead Model: 377171
Implantable Lead Serial Number: 8000143637
Implantable Lead Serial Number: 8000151447
Implantable Pulse Generator Implant Date: 20220204
Pulse Gen Model: 407145
Pulse Gen Serial Number: 70020732

## 2023-11-27 NOTE — Telephone Encounter (Signed)
 The order has now been sent to [facility]

## 2023-11-27 NOTE — Patient Instructions (Signed)
 Medication Instructions:   Your physician recommends that you continue on your current medications as directed. Please refer to the Current Medication list given to you today.   *If you need a refill on your cardiac medications before your next appointment, please call your pharmacy*   Lab Work: NONE ORDERED  TODAY    If you have labs (blood work) drawn today and your tests are completely normal, you will receive your results only by: MyChart Message (if you have MyChart) OR A paper copy in the mail If you have any lab test that is abnormal or we need to change your treatment, we will call you to review the results.    Testing/Procedures: NONE ORDERED  TODAY    Follow-Up: At [facility], you and your health needs are our priority.  As part of our continuing mission to provide you with exceptional heart care, our providers are all part of one team.  This team includes your primary Cardiologist (physician) and Advanced Practice Providers or APPs (Physician Assistants and Nurse Practitioners) who all work together to provide you with the care you need, when you need it.  Your next appointment:    1 year(s)  Provider:   You may see Manya Sells, MD or one of the following Advanced Practice Providers on your designated Care Team:   Mertha Abrahams, [place]  We recommend signing up for the patient portal called "MyChart".  Sign up information is provided on this After Visit Summary.  MyChart is used to connect with patients for Virtual Visits (Telemedicine).  Patients are able to view lab/test results, encounter notes, upcoming appointments, etc.  Non-urgent messages can be sent to your provider as well.   To learn more about what you can do with MyChart, go to [email or web address].   Other Instructions

## 2023-12-01 ENCOUNTER — Ambulatory Visit (HOSPITAL_COMMUNITY)
Admission: RE | Admit: 2023-12-01 | Discharge: 2023-12-01 | Disposition: A | Discharge status: Home / Self Care | Source: Ambulatory Visit | Attending: Gastroenterology | Admitting: Gastroenterology

## 2023-12-01 DIAGNOSIS — R7989 Other specified abnormal findings of blood chemistry: Secondary | ICD-10-CM | POA: Diagnosis not present

## 2023-12-01 DIAGNOSIS — K709 Alcoholic liver disease, unspecified: Secondary | ICD-10-CM | POA: Diagnosis not present

## 2023-12-01 DIAGNOSIS — K7460 Unspecified cirrhosis of liver: Secondary | ICD-10-CM | POA: Diagnosis not present

## 2023-12-01 DIAGNOSIS — K7689 Other specified diseases of liver: Secondary | ICD-10-CM | POA: Diagnosis not present

## 2023-12-01 DIAGNOSIS — K759 Inflammatory liver disease, unspecified: Secondary | ICD-10-CM | POA: Diagnosis not present

## 2023-12-04 ENCOUNTER — Other Ambulatory Visit: Payer: Self-pay | Admitting: Internal Medicine

## 2023-12-04 DIAGNOSIS — I480 Paroxysmal atrial fibrillation: Secondary | ICD-10-CM

## 2023-12-08 ENCOUNTER — Ambulatory Visit: Payer: Self-pay | Admitting: Gastroenterology

## 2023-12-09 ENCOUNTER — Telehealth: Payer: Self-pay | Admitting: Internal Medicine

## 2023-12-09 ENCOUNTER — Other Ambulatory Visit: Payer: Self-pay | Admitting: *Deleted

## 2023-12-09 DIAGNOSIS — R7989 Other specified abnormal findings of blood chemistry: Secondary | ICD-10-CM

## 2023-12-09 NOTE — Telephone Encounter (Signed)
 Rx sent in for a year supply in February 2025.

## 2023-12-09 NOTE — Telephone Encounter (Signed)
*  STAT* If patient is at the pharmacy, call can be transferred to refill team.   1. Which medications need to be refilled? (please list name of each medication and dose if known) apixaban  (ELIQUIS ) 5 MG TABS tablet   empagliflozin  (JARDIANCE ) 10 MG TABS tablet   2. Which pharmacy/location (including street and city if local pharmacy) is medication to be sent to?  [id number] - [place], [place] - [address]    3. Do they need a 30 day or 90 day supply? 90

## 2023-12-11 ENCOUNTER — Ambulatory Visit: Payer: Self-pay | Admitting: Gastroenterology

## 2023-12-11 ENCOUNTER — Other Ambulatory Visit

## 2023-12-11 DIAGNOSIS — R7989 Other specified abnormal findings of blood chemistry: Secondary | ICD-10-CM

## 2023-12-11 LAB — HEPATIC FUNCTION PANEL
ALT: 25 U/L (ref 0–35)
AST: 33 U/L (ref 0–37)
Albumin: 3.8 g/dL (ref 3.5–5.2)
Alkaline Phosphatase: 60 U/L (ref 39–117)
Bilirubin, Direct: 0.1 mg/dL (ref 0.0–0.3)
Total Bilirubin: 0.5 mg/dL (ref 0.2–1.2)
Total Protein: 7 g/dL (ref 6.0–8.3)

## 2023-12-11 NOTE — Progress Notes (Signed)
 Sheila Morales

## 2023-12-18 ENCOUNTER — Telehealth: Payer: Self-pay | Admitting: Internal Medicine

## 2023-12-18 DIAGNOSIS — I480 Paroxysmal atrial fibrillation: Secondary | ICD-10-CM

## 2023-12-18 MED ORDER — APIXABAN 5 MG PO TABS: 5.0000 mg | ORAL_TABLET | Freq: Two times a day (BID) | ORAL | 1 refills | Status: AC

## 2023-12-18 NOTE — Telephone Encounter (Signed)
*  STAT* If patient is at the pharmacy, call can be transferred to refill team.   1. Which medications need to be refilled? (please list name of each medication and dose if known)   apixaban  (ELIQUIS ) 5 MG TABS tablet   2. Would you like to learn more about the convenience, safety, & potential cost savings by using the [facility]?   3. Are you open to using the [facility] (Type [facility]. ).  4. Which pharmacy/location (including street and city if local pharmacy) is medication to be sent to?  [id number] - [place], [place] - [address]   5. Do they need a 30 day or 90 day supply?   Patient stated she will be out of this medication Sunday, 8/31.  Patient has appointment scheduled with Dr. Loni on 9/26.

## 2023-12-19 NOTE — Telephone Encounter (Signed)
 PCC'S- please see pt mychart msg and advise why the CPAP order still has not been fulfilled.

## 2023-12-24 ENCOUNTER — Telehealth: Payer: Self-pay | Admitting: *Deleted

## 2023-12-24 ENCOUNTER — Encounter: Payer: Self-pay | Admitting: Pulmonary Disease

## 2023-12-24 NOTE — Telephone Encounter (Signed)
 I called [facility] and spoke with Paris regarding the order from 11/26/23 for the CPAP machine and supplies.  She stated that back in March a message was sent that patient received a CPAP and supplies 02/24/2023 and before she can change to [facility] she would have to have her CPAP machine paid off in full.  Because of this, [facility] was unable to process the order.  I called and spoke with the patient and advised of the above.  She states that [employer] was constantly calling her about supplies.  She states that she turned her machine in to [facility] on 07/08/23.  She wanted to changed companies, she said that their computer did not communicate with their office which was in another location. She said that they did not know what she had and what she didn't have.   She wanted to use a local company.  I let her know that I would call [facility] back and let them know that she turned the other machine in and wants to get another one.  I told her I would find out what exactly they need to get her started with them and then I would call her back.  She verbalized understanding.  Called and spoke with patient, advised she would need a new OV and order to get new CPAP machine.   She had an appointment with Dr. Annella on 02/19/24, however I cancelled that one and scheduled her with Tammy on 01/12/24 at 11:30 am.  I did let her know that she may need a new sleep study since she did have a break in therapy.  She did say should would not do another in lab sleep study.  Advised we would see her on 9/22.  She verbalized understanding.  Nothing further needed.

## 2023-12-25 ENCOUNTER — Telehealth: Payer: Self-pay

## 2023-12-25 ENCOUNTER — Other Ambulatory Visit: Payer: Self-pay | Admitting: Internal Medicine

## 2023-12-25 NOTE — Telephone Encounter (Signed)
 Copied from CRM #[id number]. Topic: Clinical - Lab/Test Results >> Dec 23, 2023  1:16 PM Isabell A wrote: Reason for CRM: Patient is calling to confirm if her sleep study results have been sent to [facility].   Callback number: [phone] >> Dec 24, 2023 12:58 PM Benton O wrote: is there away i cantalk to one of the nurses in dr [employer] office . they sent aa prescription to [facility] and i want to tell them we are not using [facility] we are using [facility]    Powell Sprawls, RN  handled this yesterday and pt will need to keep her upcoming appt on 01-12-24. NFN

## 2024-01-01 NOTE — Telephone Encounter (Signed)
 See other note, this has already been addressed.  Patient has a f/u with Tammy to address.  Nothing further needed.

## 2024-01-01 NOTE — Telephone Encounter (Signed)
 I got this message back from [facility], Glade Munroe, Zebedee BROCKS; Zott, Stacy; [place], Tammy Hi Soudan, I have responded back to two other messages concerning this order,  Pt had a machine thru another provider that billed for 5 months she will need to stay with her current provider until the machine is paid in full, we are unable to get authorization for the CPAP.

## 2024-01-04 ENCOUNTER — Other Ambulatory Visit (HOSPITAL_COMMUNITY): Payer: Self-pay | Admitting: Internal Medicine

## 2024-01-05 ENCOUNTER — Telehealth: Payer: Self-pay | Admitting: Internal Medicine

## 2024-01-05 MED ORDER — METOPROLOL SUCCINATE ER 25 MG PO TB24: 25.0000 mg | ORAL_TABLET | Freq: Every day | ORAL | 0 refills | Status: DC

## 2024-01-05 NOTE — Telephone Encounter (Signed)
*  STAT* If patient is at the pharmacy, call can be transferred to refill team.   1. Which medications need to be refilled? (please list name of each medication and dose if known)   metoprolol  succinate (TOPROL -XL) 25 MG 24 hr tablet    4. Which pharmacy/location (including street and city if local pharmacy) is medication to be sent to?  [id number] [place],  - [address]     5. Do they need a 30 day or 90 day supply? 90    Scheduled 9/26

## 2024-01-05 NOTE — Telephone Encounter (Signed)
 Pt's medication was sent to pt's pharmacy as requested. Confirmation received.

## 2024-01-12 ENCOUNTER — Telehealth: Payer: Self-pay

## 2024-01-12 ENCOUNTER — Ambulatory Visit (INDEPENDENT_AMBULATORY_CARE_PROVIDER_SITE_OTHER): Admitting: Adult Health

## 2024-01-12 ENCOUNTER — Encounter: Payer: Self-pay | Admitting: Adult Health

## 2024-01-12 VITALS — BP 129/86 | HR 64 | Temp 98.4°F | Ht 62.0 in | Wt 126.4 lb

## 2024-01-12 DIAGNOSIS — J449 Chronic obstructive pulmonary disease, unspecified: Secondary | ICD-10-CM

## 2024-01-12 DIAGNOSIS — J8281 Chronic eosinophilic pneumonia: Secondary | ICD-10-CM

## 2024-01-12 DIAGNOSIS — J9611 Chronic respiratory failure with hypoxia: Secondary | ICD-10-CM | POA: Diagnosis not present

## 2024-01-12 DIAGNOSIS — J040 Acute laryngitis: Secondary | ICD-10-CM

## 2024-01-12 DIAGNOSIS — G4733 Obstructive sleep apnea (adult) (pediatric): Secondary | ICD-10-CM | POA: Diagnosis not present

## 2024-01-12 DIAGNOSIS — J849 Interstitial pulmonary disease, unspecified: Secondary | ICD-10-CM

## 2024-01-12 DIAGNOSIS — I5032 Chronic diastolic (congestive) heart failure: Secondary | ICD-10-CM

## 2024-01-12 MED ORDER — PREDNISONE 20 MG PO TABS: ORAL_TABLET | ORAL | 1 refills | Status: AC

## 2024-01-12 MED ORDER — BENZONATATE 100 MG PO CAPS: 100.0000 mg | ORAL_CAPSULE | Freq: Three times a day (TID) | ORAL | 3 refills | Status: AC | PRN

## 2024-01-12 NOTE — Patient Instructions (Addendum)
 HRCT chest  Continue on ANORO 1 puff daily  Albuterol  inhaler As needed     Mucinex  and Tessalon  As needed  cough  Activity as tolerated.   Check on CPAP status.  Begin CPAP At bedtime, wear all night long for >4hr each night .  Healthy sleep regimen  Do not drive if sleepy .   Follow up with Dr. Annella in 3 months and As needed  -with PFT  Please contact office for sooner follow up if symptoms do not improve or worsen or seek emergency care

## 2024-01-12 NOTE — Progress Notes (Signed)
 @Patient  ID: Sheila Morales, female    DOB: April 07, 1951, 73 y.o.   MRN: [id number]  Chief Complaint  Patient presents with   Obstructive Sleep Apnea    Needs office visit for new cpap    Referring provider: Clarice Nottingham, MD  HPI: 73 yo female smoker followed for chronic pulmonary infiltrates suspicious for chronic eosinophilic pneumonia (peripheral eosinophilia and resolution of cough and fever with steroids), chronic respiratory failure, COPD, ILD -NSIP  Medical history significant for SLE previously on methotrexate  and plaquenil , A-fib previously on Amiodarone , sick sinus syndrome status post pacemaker, chronic hyponatremia, DCHF   TEST/EVENTS :  Groundglass opacities within both lungs increase in the right upper lobe appear progressive, small right pleural effusion, cardiomegaly, high density material in the distal esophagus questionable reflux or delayed transit   2D echo January 10, 2022 EF 65 to 70%, LVH, normal pulmonary artery systolic pressure, mild to moderate tricuspid valve regurg  2018 PFTs where spirometry is suggestive of moderate obstruction, no bronchodilator response, TLC within normal limits, DLCO moderately reduced   11/2022 Severe OSA  10/09/23 NPSG -Mod OSA AHI 14.8/hr , optimal CPAP 13cmH2o.   01/12/2024 Follow up: OSA, Chronic pulmonary infiltrates ? Chronic eosinophilic pneumonia, Hospital follow up,  Discussed the use of AI scribe software for clinical note transcription with the patient, who gave verbal consent to proceed.  History of Present Illness Sheila Morales is a 73 year old female with chronic pulmonary infiltrates and COPD who presents for follow-up of her respiratory conditions. Last seen in office Oct 2024   She has a history of chronic pulmonary infiltrates on imaging, suspicious for chronic eosinophilic pneumonia, with peripheral eosinophilia, cough, and fever. She was previously treated with steroids with perceived benefit and clinical response.   Last visit was changed to Trelegy.  Previously tried on Breztri. Prefers using Anoro for her COPD, finding it more effective. She has a history of smoking but reports only occasional smoking now.  Discussed smoking cessation.  She previously used oxygen  with activity but returned her oxygen  equipment in March 2025  Her oxygen  levels at home range from 88 to 96%, dropping with exertion but recovering quickly. She was hospitalized in July 2025 for a possible pneumonia and COPD exacerbation, treated with IV antibiotics, a Z-Pak, and steroids. She remains very active, going to the gym three times a week and walking regularly.  Feels better since she got home.  No recent flare of cough or wheezing.  She has suspected ILD-NSIP with superimposed septal thickening along Ground glass opacites upper lobes R>L on CT imaging.   She also has severe sleep apnea and discontinued CPAP therapy due to issues with the provider, considering purchasing a machine outright.  Recent repeat sleep study October 09, 2023 showed mild to moderate sleep apnea with AHI 14.8/hour with optimal control on CPAP 13 cm H2O.  They are very frustrated with the process of getting a new CPAP machine.  An order has been placed but they have not received their new machine.  She complains of snoring and daytime sleepiness.  Patient in the past is found that using a short course of prednisone  along with diuretic when she has a flare of symptoms with cough wheezing seems to help avoid hospitalizations She uses prednisone  approximately once every eight to ten weeks for flare-ups but has had difficulty obtaining refills due to prescription management issues.  She is requesting a refill today.    No Known Allergies  Immunization History  Administered Date(s) Administered   Fluad Quad(high Dose 65+) 03/29/2019   H1N1 05/31/2008   INFLUENZA, HIGH DOSE SEASONAL PF 02/19/2016, 02/05/2018   Influenza Split 02/13/2012   Influenza Whole 01/21/2008,  01/17/2009   Influenza,inj,Quad PF,6+ Mos 01/21/2014   Influenza-Unspecified 02/02/2015, 02/03/2017   Pneumococcal Conjugate-13 02/19/2016   Pneumococcal Polysaccharide-23 02/24/2017   Tdap 03/29/2019    Past Medical History:  Diagnosis Date   A-fib [facility])    Acute respiratory failure with hypoxia (HCC) 11/05/2017   Arthritis    Cervicalgia    CHF (congestive heart failure) (HCC)    Colitis, ischemic (HCC)    COPD (chronic obstructive pulmonary disease) (HCC)    External hemorrhoids    H/O: rheumatic fever    Heart disease    IBS (irritable bowel syndrome)    Lupus (systemic lupus erythematosus) (HCC)    NAFLD (nonalcoholic fatty liver disease)    Osteoporosis    Pacemaker    Personal history colonic adenoma 03/25/2008   06/2007 right sided adenoma and 2 right hyperplastic polyps     Restless leg syndrome    questionable   Sleep apnea    Systemic lupus erythematosus (HCC)    Tubular adenoma of colon    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     Tobacco History: Social History   Tobacco Use  Smoking Status Some Days   Current packs/day: 0.00   Average packs/day: 0.3 packs/day for 30.0 years (7.5 ttl pk-yrs)   Types: Cigarettes   Start date: 01/20/1990   Last attempt to quit: 01/21/2020   Years since quitting: 3.9  Smokeless Tobacco Never  Tobacco Comments   smokes every now and then 08/24/20 [facility]    09/25/2023 I don't smoke that much anymore   Ready to quit: Not Answered Counseling given: Not Answered Tobacco comments: smokes every now and then 08/24/20 [facility]  09/25/2023 I don't smoke that much anymore   Outpatient Medications Prior to Visit  Medication Sig Dispense Refill   acetaminophen  (TYLENOL ) 325 MG tablet Take 650 mg by mouth every 6 (six) hours as needed for moderate pain or mild pain.     albuterol  (VENTOLIN  HFA) 108 (90 Base) MCG/ACT inhaler Inhale 1-2 puffs into the lungs every 6 (six) hours as needed for wheezing or shortness of breath. 8 g 5   apixaban   (ELIQUIS ) 5 MG TABS tablet Take 1 tablet (5 mg total) by mouth 2 (two) times daily. 180 tablet 1   Bacillus Coagulans-Inulin (PROBIOTIC-PREBIOTIC PO) Take 1 capsule by mouth daily at 12 noon. Bio Complete     benzonatate  (TESSALON ) 100 MG capsule Take 1 capsule (100 mg total) by mouth every 6 (six) hours as needed for cough. 90 capsule 1   Cholecalciferol  (VITAMIN D ) 50 MCG (2000 UT) tablet Take 4,000 Units by mouth every evening.     denosumab (PROLIA) 60 MG/ML SOSY injection Inject 60 mg into the skin every 6 (six) months.     DM-APAP-CPM (CORICIDIN HBP PO) Take 5 mLs by mouth every 6 (six) hours as needed (congestion).     empagliflozin  (JARDIANCE ) 10 MG TABS tablet TAKE 1 TABLET BY MOUTH DAILY BEFORE BREAKFAST. 90 tablet 3   fluticasone  (FLONASE ) 50 MCG/ACT nasal spray SPRAY 2 SPRAYS INTO EACH NOSTRIL EVERY DAY 48 mL 3   furosemide  (LASIX ) 20 MG tablet TAKE 1 TABLET (20 MG) BY MOUTH DAILY, TAKE 1 EXTRA TABLET BY MOUTH (20 MG) AS NEEDED FOR INCREASED SWELLING. 90 tablet 2   gabapentin  (NEURONTIN ) 300 MG  capsule TAKE 2 CAPSULES BY MOUTH EVERY MORNING, 2 CAPSULES EACH AFTERNOON AND 3 CAPSULES AT BEDTIME 630 capsule 0   guaiFENesin  (MUCINEX ) 600 MG 12 hr tablet Take 600 mg by mouth 2 (two) times daily as needed (congestion).     levocetirizine (XYZAL) 5 MG tablet SMARTSIG:1 Tablet(s) By Mouth Every Evening     MAGNESIUM  COMPLEX HIGH POTENCY PO Take 250 mg by mouth.     metoprolol  succinate (TOPROL -XL) 25 MG 24 hr tablet Take 1 tablet (25 mg total) by mouth daily. 90 tablet 0   Misc Natural Products (ADVANCED JOINT RELIEF) CAPS Take 1 capsule by mouth daily. Joint Advantage Gold 5x     Multiple Vitamin (MULTIVITAMIN WITH MINERALS) TABS tablet Take 1 tablet by mouth daily.     predniSONE  (DELTASONE ) 20 MG tablet AS NEEDED IF BRONCHITIS RECURS. TAKE 40 MG ONCE DAILY FOR 5 DAYS. 20 tablet 1   RESTASIS  0.05 % ophthalmic emulsion Place 1 drop into both eyes 3 (three) times daily.  6   triamcinolone  cream  (KENALOG ) 0.1 % Apply 1 Application topically daily as needed (irritation).     TURMERIC CURCUMIN PO Take 1 capsule by mouth daily at 12 noon.     umeclidinium-vilanterol (ANORO ELLIPTA ) 62.5-25 MCG/ACT AEPB Inhale 1 puff into the lungs daily. 60 each 11   vitamin C  (ASCORBIC ACID ) 500 MG tablet Take 1,000 mg by mouth daily.     VITAMIN E  PO Take 150 mg by mouth daily.     zolpidem  (AMBIEN ) 5 MG tablet TAKE 1 TABLET BY MOUTH AT BEDTIME AS NEEDED FOR SLEEP 90 tablet 1   CALCIUM  PO Take 600 mg by mouth.     No facility-administered medications prior to visit.     Review of Systems:   Constitutional:   No  weight loss, night sweats,  Fevers, chills, +fatigue, or  lassitude.  HEENT:   No headaches,  Difficulty swallowing,  Tooth/dental problems, or  Sore throat,                No sneezing, itching, ear ache, nasal congestion, post nasal drip,   CV:  No chest pain,  Orthopnea, PND, swelling in lower extremities, anasarca, dizziness, palpitations, syncope.   GI  No heartburn, indigestion, abdominal pain, nausea, vomiting, diarrhea, change in bowel habits, loss of appetite, bloody stools.   Resp:.  No chest wall deformity  Skin: no rash or lesions.  GU: no dysuria, change in color of urine, no urgency or frequency.  No flank pain, no hematuria   MS:  No joint pain or swelling.  No decreased range of motion.  No back pain.    Physical Exam  BP 129/86   Pulse 64   Temp 98.4 F (36.9 C) (Oral)   Ht 5' 2 (1.575 m)   Wt 126 lb 6.4 oz (57.3 kg)   SpO2 94%   BMI 23.12 kg/m   GEN: A/Ox3; pleasant , NAD, well nourished    HEENT:  [place]/AT,  EACs-clear, TMs-wnl, NOSE-clear, THROAT-clear, no lesions, no postnasal drip or exudate noted.   NECK:  Supple w/ fair ROM; no JVD; normal carotid impulses w/o bruits; no thyromegaly or nodules palpated; no lymphadenopathy.    RESP faint bibasilar crackles  no accessory muscle use, no dullness to percussion  CARD:  RRR, no m/r/g, no peripheral  edema, pulses intact, no cyanosis or clubbing.  GI:   Soft & nt; nml bowel sounds; no organomegaly or masses detected.   Musco: Warm bil,  no deformities or joint swelling noted.   Neuro: alert, no focal deficits noted.    Skin: Warm, no lesions or rashes    Lab Results:  CBC  BMET  Imaging: No results found.  Administration History     None          Latest Ref Rng & Units 08/15/2022    3:53 PM 02/06/2017    3:09 PM  PFT Results  FVC-Pre L 1.81  2.14   FVC-Predicted Pre % 67  72   FVC-Post L 1.77  2.22   FVC-Predicted Post % 65  74   Pre FEV1/FVC % % 66  60   Post FEV1/FCV % % 55  59   FEV1-Pre L 1.19  1.28   FEV1-Predicted Pre % 58  56   FEV1-Post L 0.97  1.30   DLCO uncorrected ml/min/mmHg 12.08  12.45   DLCO UNC% % 66  54   DLCO corrected ml/min/mmHg 12.08  12.34   DLCO COR %Predicted % 66  53   DLVA Predicted % 79  66   TLC L 4.17  4.65   TLC % Predicted % 87  94   RV % Predicted % 95  107     No results found for: NITRICOXIDE      Assessment & Plan:   No problem-specific Assessment & Plan notes found for this encounter.   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 A recent hospital admission in July 2025 for possible pneumonia was treated with IV antibiotics and steroids. Has frequent flaring symptoms such as cough and shortness of breath requiring frequent prednisone  use.  Discussed risks of prednisone  including bone loss, fractures, and diabetes. Order a chest CT to assess lung scarring.  Check spirometry with DLCO on return.  Refill prednisone  with instructions to use 40 mg for 5 days during flare-ups.  2 refills given only.  She is to call our office or send a  MyChart message if she has to use the prednisone .  Monitor frequency of prednisone  use.   Chronic obstructive pulmonary disease (COPD)   She has COPD with a smoking history and prefers the Anoro inhaler over Trelegy for better efficacy. She is no longer using supplemental oxygen , and her oxygen  saturation today is 94%. Continue Anoro inhaler for COPD management. Monitor oxygen  levels during exertion and report if persistently below 88%.  Smoking cessation discussed in detail.  Check spirometry with DLCO on return  Chronic respiratory failure, currently off supplemental oxygen    She has hx chronic respiratory failure, previously requiring supplemental oxygen , now discontinued. Her oxygen  saturation is 94% today. She reports being very active, attending the gym three times a week and walking regularly. Monitor oxygen  levels during physical activity. Encourage continued physical activity and spot check oxygen  levels.  Report any oxygen  levels 88% or below.  Obstructive sleep apnea, status post recent sleep study, pending CPAP initiation   She has  obstructive sleep apnea with a recent sleep study completed. There are issues with CPAP machine acquisition due to insurance and supplier complications. . Order CPAP machine from [facility] pending .  Begin CPAP once received.  Optimal control on CPAP 13 cm H2O. Schedule follow-up in three months to assess CPAP efficacy and review CT scan results.  Declines flu shot  Plan  Patient Instructions  HRCT chest  Continue on ANORO 1 puff daily  Albuterol  inhaler As needed     Mucinex  and Tessalon  As needed  cough  Activity as tolerated.   Check on CPAP status.  Begin CPAP At bedtime, wear all night long for >4hr each night .  Healthy sleep regimen  Do not drive if sleepy .   Follow up with Dr. Annella in 3 months and As needed  -with PFT  Please contact office for sooner follow up if symptoms do not improve or worsen or seek emergency care      I  spent  45  minutes dedicated to the care of this patient on the date of this encounter to include pre-visit review of records, face-to-face time with the patient discussing conditions above, post visit ordering of testing, clinical documentation with the electronic health record, making appropriate referrals as documented, and communicating necessary findings to members of the patients care team.   Madelin Stank, NP 01/12/2024

## 2024-01-12 NOTE — Telephone Encounter (Signed)
 Spoke with rep at [facility] In regards to cpap machine order,per rep at [facility] they are unable to accept order to due to not being able to get authorization from insurance,is not specfic reason stated,will call patient to see to inform them and to reach out to insurance

## 2024-01-16 ENCOUNTER — Ambulatory Visit
Admission: RE | Admit: 2024-01-16 | Discharge: 2024-01-16 | Disposition: A | Discharge status: Home / Self Care | Source: Ambulatory Visit | Attending: Adult Health | Admitting: Adult Health

## 2024-01-16 ENCOUNTER — Ambulatory Visit: Admitting: Internal Medicine

## 2024-01-16 VITALS — BP 128/60 | HR 71 | Ht 62.0 in | Wt 126.0 lb

## 2024-01-16 DIAGNOSIS — I5032 Chronic diastolic (congestive) heart failure: Secondary | ICD-10-CM

## 2024-01-16 DIAGNOSIS — I10 Essential (primary) hypertension: Secondary | ICD-10-CM | POA: Diagnosis not present

## 2024-01-16 DIAGNOSIS — I4719 Other supraventricular tachycardia: Secondary | ICD-10-CM | POA: Insufficient documentation

## 2024-01-16 DIAGNOSIS — R918 Other nonspecific abnormal finding of lung field: Secondary | ICD-10-CM | POA: Diagnosis not present

## 2024-01-16 DIAGNOSIS — M3213 Lung involvement in systemic lupus erythematosus: Secondary | ICD-10-CM | POA: Diagnosis not present

## 2024-01-16 DIAGNOSIS — J849 Interstitial pulmonary disease, unspecified: Secondary | ICD-10-CM | POA: Insufficient documentation

## 2024-01-16 DIAGNOSIS — I480 Paroxysmal atrial fibrillation: Secondary | ICD-10-CM

## 2024-01-16 DIAGNOSIS — D6869 Other thrombophilia: Secondary | ICD-10-CM | POA: Diagnosis not present

## 2024-01-16 DIAGNOSIS — Z950 Presence of cardiac pacemaker: Secondary | ICD-10-CM | POA: Insufficient documentation

## 2024-01-16 DIAGNOSIS — I495 Sick sinus syndrome: Secondary | ICD-10-CM | POA: Insufficient documentation

## 2024-01-16 DIAGNOSIS — I427 Cardiomyopathy due to drug and external agent: Secondary | ICD-10-CM

## 2024-01-16 DIAGNOSIS — R0683 Snoring: Secondary | ICD-10-CM | POA: Diagnosis not present

## 2024-01-16 DIAGNOSIS — J432 Centrilobular emphysema: Secondary | ICD-10-CM | POA: Diagnosis not present

## 2024-01-16 NOTE — Patient Instructions (Signed)
 Medication Instructions:  Your physician recommends that you continue on your current medications as directed. Please refer to the Current Medication list given to you today.  *If you need a refill on your cardiac medications before your next appointment, please call your pharmacy*  Lab Work: None ordered  If you have labs (blood work) drawn today and your tests are completely normal, you will receive your results only by: MyChart Message (if you have MyChart) OR A paper copy in the mail If you have any lab test that is abnormal or we need to change your treatment, we will call you to review the results.  Testing/Procedures: None ordered  Follow-Up: At [facility], you and your health needs are our priority.  As part of our continuing mission to provide you with exceptional heart care, our providers are all part of one team.  This team includes your primary Cardiologist (physician) and Advanced Practice Providers or APPs (Physician Assistants and Nurse Practitioners) who all work together to provide you with the care you need, when you need it.  Your next appointment:   12 month(s)  Provider:       We recommend signing up for the patient portal called MyChart.  Sign up information is provided on this After Visit Summary.  MyChart is used to connect with patients for Virtual Visits (Telemedicine).  Patients are able to view lab/test results, encounter notes, upcoming appointments, etc.  Non-urgent messages can be sent to your provider as well.   To learn more about what you can do with MyChart, go to [email or web address].   Other Instructions [facility] [phone] and we do offer delivery :)

## 2024-01-16 NOTE — Progress Notes (Signed)
  Cardiology Office Note:  .   Date:  01/16/2024  ID:  Sheila Morales, DOB 08-30-1950, MRN [id number] PCP: Clarice Nottingham, MD  [facility] Providers Cardiologist:  Soyla DELENA Merck, MD Electrophysiologist:  Danelle Birmingham, MD    History of Present Illness: .   Sheila Morales is a 73 y.o. female.  Discussed the use of AI scribe software for clinical note transcription with the patient, who gave verbal consent to proceed.  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  She maintains an active lifestyle, walking regularly and attending the gym for strength training. She experiences difficulty rising from the floor without assistance, which she is addressing. She takes Eliquis  without major bleeding issues but has occasional skin eruptions. Her Lasix  dosage is 20 mg daily, adjusted as needed, and she takes Jardiance  10 mg daily. She manages tingling in her feet and ankles with gabapentin , adjusting the dose for symptom relief. She uses a CPAP machine, though there are insurance-related delays in obtaining a new one.    ROS: negative except per HPI above.  Studies Reviewed: .        Results LABS Infection marker: Normal (10/2023)  DIAGNOSTIC Pacemaker evaluation: Normal (11/2023) Risk Assessment/Calculations:    CHA2DS2-VASc Score = 3   This indicates a 3.2% annual risk of stroke. The patient's score is based upon: CHF History: 1 HTN History: 0 Diabetes History: 0 Stroke History: 0 Vascular Disease History: 0 Age Score: 1 Gender Score: 1      Physical Exam:   VS:  BP 128/60 (BP Location: Left Arm, Patient Position: Sitting, Cuff Size: Normal)   Pulse 71 Comment: Irregularly irregular.  Ht 5' 2 (1.575 m)   Wt 126 lb (57.2 kg)   SpO2 95%   BMI 23.05 kg/m    Wt Readings from Last 3  Encounters:  01/16/24 126 lb (57.2 kg)  01/12/24 126 lb 6.4 oz (57.3 kg)  11/27/23 122 lb (55.3 kg)     Physical Exam GENERAL: Alert, cooperative, well developed, no acute distress HEENT: Normocephalic, normal oropharynx, moist mucous membranes CHEST: Clear to auscultation bilaterally, no wheezes, rhonchi, or crackles CARDIOVASCULAR: Normal heart rate and rhythm, S1 and S2 normal without murmurs ABDOMEN: Soft, non-tender, non-distended, without organomegaly, normal bowel sounds EXTREMITIES: No cyanosis or edema NEUROLOGICAL: Cranial nerves grossly intact, moves all extremities without gross motor or sensory deficit   ASSESSMENT AND PLAN: .    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 - Reduce Lasix  to as needed per patient preference, monitor for fluid accumulation. Lasix  20 mg prn.  - continue jardiance  10 mg daily.  - cont toprol  xl 25 mg daily  Atrial fibrillation and atrial tachycardia No recurrence of atrial fibrillation. Atrial tachycardia under surveillance. On Eliquis , no major bleeding reported. - Continue Eliquis  5 mg bid, monitor for major bleeding.  Cardiac pacemaker Pacemaker functioning well, no issues since last evaluation. - Continue routine surveillance of pacemaker function.  Peripheral neuropathy Experiencing tingling in feet and ankles. Gabapentin  provides relief.  Obstructive sleep apnea Issues with CPAP equipment due to insurance and supplier communication problems, unresolved. - Continue efforts to resolve CPAP equipment issues with insurance and supplier.      Soyla Merck, MD, FACC

## 2024-01-17 ENCOUNTER — Other Ambulatory Visit (HOSPITAL_COMMUNITY): Payer: Self-pay

## 2024-01-17 MED ORDER — APIXABAN 5 MG PO TABS
5.0000 mg | ORAL_TABLET | Freq: Two times a day (BID) | ORAL | 0 refills | Status: DC
  Filled 2024-01-17 – 2024-03-21 (×2): qty 180, 90d supply, fill #0

## 2024-01-17 MED ORDER — UMECLIDINIUM-VILANTEROL 62.5-25 MCG/ACT IN AEPB
1.0000 | INHALATION_SPRAY | Freq: Every day | RESPIRATORY_TRACT | 4 refills | Status: DC
  Filled 2024-01-17 – 2024-01-19 (×2): qty 60, 30d supply, fill #0

## 2024-01-17 MED ORDER — BENZONATATE 100 MG PO CAPS
100.0000 mg | ORAL_CAPSULE | Freq: Three times a day (TID) | ORAL | 3 refills | Status: DC | PRN
  Filled 2024-01-17: qty 90, 30d supply, fill #0

## 2024-01-17 MED ORDER — LEVOCETIRIZINE DIHYDROCHLORIDE 5 MG PO TABS
5.0000 mg | ORAL_TABLET | Freq: Every day | ORAL | 3 refills | Status: AC
  Filled 2024-01-17 – 2024-05-01 (×6): qty 90, 90d supply, fill #0

## 2024-01-17 MED ORDER — EMPAGLIFLOZIN 10 MG PO TABS
10.0000 mg | ORAL_TABLET | Freq: Every day | ORAL | 0 refills | Status: DC
  Filled 2024-01-17: qty 90, 90d supply, fill #0

## 2024-01-19 ENCOUNTER — Other Ambulatory Visit: Payer: Self-pay | Admitting: Pulmonary Disease

## 2024-01-19 ENCOUNTER — Other Ambulatory Visit (HOSPITAL_COMMUNITY): Payer: Self-pay

## 2024-01-19 ENCOUNTER — Other Ambulatory Visit: Payer: Self-pay

## 2024-01-19 DIAGNOSIS — J209 Acute bronchitis, unspecified: Secondary | ICD-10-CM

## 2024-01-19 MED ORDER — ALBUTEROL SULFATE HFA 108 (90 BASE) MCG/ACT IN AERS
1.0000 | INHALATION_SPRAY | Freq: Four times a day (QID) | RESPIRATORY_TRACT | 5 refills | Status: DC | PRN
  Filled 2024-01-19: qty 6.7, 25d supply, fill #0

## 2024-01-19 NOTE — Progress Notes (Signed)
 Remote PPM Transmission

## 2024-01-20 ENCOUNTER — Ambulatory Visit: Payer: Self-pay | Admitting: Adult Health

## 2024-01-20 NOTE — Progress Notes (Signed)
 Called and spoke to pt - advised of CT results per Tammy Parrett. Pt verbalized understanding. NFN.

## 2024-01-20 NOTE — Telephone Encounter (Signed)
 Pt spoke with [facility] and [facility] told them they were able to work out with their insurance that they would be able to get the CPAP through them. Pt said he would call in 2 weeks if he hasn't heard. As of right now NFN

## 2024-01-20 NOTE — Telephone Encounter (Signed)
 Sent Brad a message to check on this

## 2024-02-03 DIAGNOSIS — H353111 Nonexudative age-related macular degeneration, right eye, early dry stage: Secondary | ICD-10-CM | POA: Diagnosis not present

## 2024-02-03 DIAGNOSIS — H04123 Dry eye syndrome of bilateral lacrimal glands: Secondary | ICD-10-CM | POA: Diagnosis not present

## 2024-02-03 DIAGNOSIS — H524 Presbyopia: Secondary | ICD-10-CM | POA: Diagnosis not present

## 2024-02-03 DIAGNOSIS — H16223 Keratoconjunctivitis sicca, not specified as Sjogren's, bilateral: Secondary | ICD-10-CM | POA: Diagnosis not present

## 2024-02-03 DIAGNOSIS — R400 Somnolence: Secondary | ICD-10-CM | POA: Diagnosis not present

## 2024-02-03 DIAGNOSIS — Z79899 Other long term (current) drug therapy: Secondary | ICD-10-CM | POA: Diagnosis not present

## 2024-02-12 ENCOUNTER — Other Ambulatory Visit (HOSPITAL_COMMUNITY): Payer: Self-pay

## 2024-02-12 ENCOUNTER — Other Ambulatory Visit: Payer: Self-pay

## 2024-02-12 MED ORDER — ZOLPIDEM TARTRATE 5 MG PO TABS
5.0000 mg | ORAL_TABLET | Freq: Every evening | ORAL | 1 refills | Status: DC | PRN
  Filled 2024-02-12 – 2024-02-17 (×2): qty 90, 90d supply, fill #0

## 2024-02-16 ENCOUNTER — Other Ambulatory Visit (HOSPITAL_COMMUNITY): Payer: Self-pay

## 2024-02-16 MED ORDER — ZOLPIDEM TARTRATE 5 MG PO TABS
5.0000 mg | ORAL_TABLET | Freq: Every evening | ORAL | 1 refills | Status: AC | PRN
  Filled 2024-02-19: qty 90, 90d supply, fill #0
  Filled 2024-05-18: qty 90, 90d supply, fill #1

## 2024-02-17 ENCOUNTER — Other Ambulatory Visit (HOSPITAL_COMMUNITY): Payer: Self-pay

## 2024-02-19 ENCOUNTER — Ambulatory Visit: Admitting: Pulmonary Disease

## 2024-02-19 ENCOUNTER — Other Ambulatory Visit (HOSPITAL_COMMUNITY): Payer: Self-pay

## 2024-02-19 DIAGNOSIS — M810 Age-related osteoporosis without current pathological fracture: Secondary | ICD-10-CM | POA: Diagnosis not present

## 2024-02-23 ENCOUNTER — Ambulatory Visit (INDEPENDENT_AMBULATORY_CARE_PROVIDER_SITE_OTHER): Payer: PPO

## 2024-02-23 DIAGNOSIS — I480 Paroxysmal atrial fibrillation: Secondary | ICD-10-CM | POA: Diagnosis not present

## 2024-02-23 LAB — CUP PACEART REMOTE DEVICE CHECK
Date Time Interrogation Session: 20251103111517
Implantable Lead Connection Status: 753985
Implantable Lead Connection Status: 753985
Implantable Lead Implant Date: 20220204
Implantable Lead Implant Date: 20220204
Implantable Lead Location: 753859
Implantable Lead Location: 753860
Implantable Lead Model: 377169
Implantable Lead Model: 377171
Implantable Lead Serial Number: 8000143637
Implantable Lead Serial Number: 8000151447
Implantable Pulse Generator Implant Date: 20220204
Pulse Gen Model: 407145
Pulse Gen Serial Number: 70020732

## 2024-02-24 ENCOUNTER — Other Ambulatory Visit: Payer: Self-pay

## 2024-02-24 ENCOUNTER — Other Ambulatory Visit: Payer: Self-pay | Admitting: Pulmonary Disease

## 2024-02-24 ENCOUNTER — Other Ambulatory Visit (HOSPITAL_COMMUNITY): Payer: Self-pay

## 2024-02-24 MED FILL — Umeclidinium-Vilanterol Aero Powd BA 62.5-25 MCG/ACT: RESPIRATORY_TRACT | 30 days supply | Qty: 60 | Fill #0 | Status: AC

## 2024-02-24 NOTE — Telephone Encounter (Signed)
 Copied from CRM [id number]. Topic: Clinical - Medication Refill >> Feb 24, 2024  9:57 AM Leila BROCKS wrote: Patient [phone] states needs refill umeclidinium-vilanterol (ANORO ELLIPTA ) 62.5-25 MCG/ACT AEPB and [facility] to [facility] advised patient to contact the office for refill requested. Patient states only has 3 puffs left and would like a refill today. Please advise and call back.   [place] [facility] [address] [place] [address] Phone:[phone]:[phone].

## 2024-02-25 NOTE — Progress Notes (Signed)
 Remote PPM Transmission

## 2024-02-26 ENCOUNTER — Ambulatory Visit: Payer: Self-pay | Admitting: Internal Medicine

## 2024-03-05 DIAGNOSIS — R400 Somnolence: Secondary | ICD-10-CM | POA: Diagnosis not present

## 2024-03-08 ENCOUNTER — Other Ambulatory Visit: Payer: Self-pay

## 2024-03-08 ENCOUNTER — Other Ambulatory Visit (HOSPITAL_COMMUNITY): Payer: Self-pay

## 2024-03-08 MED ORDER — GABAPENTIN 300 MG PO CAPS
ORAL_CAPSULE | ORAL | 3 refills | Status: AC
  Filled 2024-03-08: qty 210, 30d supply, fill #0
  Filled 2024-04-13: qty 210, 30d supply, fill #1

## 2024-03-10 DIAGNOSIS — I7300 Raynaud's syndrome without gangrene: Secondary | ICD-10-CM | POA: Diagnosis not present

## 2024-03-10 DIAGNOSIS — Z6822 Body mass index (BMI) 22.0-22.9, adult: Secondary | ICD-10-CM | POA: Diagnosis not present

## 2024-03-10 DIAGNOSIS — M329 Systemic lupus erythematosus, unspecified: Secondary | ICD-10-CM | POA: Diagnosis not present

## 2024-03-10 DIAGNOSIS — M48061 Spinal stenosis, lumbar region without neurogenic claudication: Secondary | ICD-10-CM | POA: Diagnosis not present

## 2024-03-10 DIAGNOSIS — Z79899 Other long term (current) drug therapy: Secondary | ICD-10-CM | POA: Diagnosis not present

## 2024-03-15 ENCOUNTER — Other Ambulatory Visit: Payer: Self-pay

## 2024-03-15 ENCOUNTER — Telehealth: Payer: Self-pay | Admitting: Internal Medicine

## 2024-03-15 ENCOUNTER — Other Ambulatory Visit (HOSPITAL_COMMUNITY): Payer: Self-pay

## 2024-03-15 MED ORDER — METOPROLOL SUCCINATE ER 25 MG PO TB24
25.0000 mg | ORAL_TABLET | Freq: Every day | ORAL | 3 refills | Status: AC
  Filled 2024-03-15: qty 90, 90d supply, fill #0

## 2024-03-15 NOTE — Telephone Encounter (Signed)
*  STAT* If patient is at the pharmacy, call can be transferred to refill team.   1. Which medications need to be refilled? (please list name of each medication and dose if known) apixaban  (ELIQUIS ) 5 MG TABS tablet   metoprolol  succinate (TOPROL -XL) 25 MG 24 hr tablet   2. Which pharmacy/location (including street and city if local pharmacy) is medication to be sent to?  [place] [facility]    3. Do they need a 30 day or 90 day supply? 90

## 2024-03-15 NOTE — Telephone Encounter (Signed)
 Refill sent.

## 2024-03-21 ENCOUNTER — Other Ambulatory Visit (HOSPITAL_COMMUNITY): Payer: Self-pay

## 2024-03-22 ENCOUNTER — Other Ambulatory Visit: Payer: Self-pay

## 2024-03-23 ENCOUNTER — Telehealth: Payer: Self-pay | Admitting: Internal Medicine

## 2024-03-23 NOTE — Telephone Encounter (Signed)
*  STAT* If patient is at the pharmacy, call can be transferred to refill team.   1. Which medications need to be refilled? (please list name of each medication and dose if known)   empagliflozin  (JARDIANCE ) 10 MG TABS tablet     4. Which pharmacy/location (including street and city if local pharmacy) is medication to be sent to?[facility]    5. Do they need a 30 day or 90 day supply? 90

## 2024-03-24 ENCOUNTER — Other Ambulatory Visit (HOSPITAL_COMMUNITY): Payer: Self-pay

## 2024-03-24 ENCOUNTER — Other Ambulatory Visit: Payer: Self-pay

## 2024-03-24 MED ORDER — EMPAGLIFLOZIN 10 MG PO TABS
10.0000 mg | ORAL_TABLET | Freq: Every day | ORAL | 3 refills | Status: AC
  Filled 2024-03-24: qty 90, 90d supply, fill #0

## 2024-03-24 NOTE — Telephone Encounter (Signed)
 Refill sent

## 2024-03-25 ENCOUNTER — Telehealth: Payer: Self-pay | Admitting: Pulmonary Disease

## 2024-03-25 NOTE — Telephone Encounter (Signed)
 Copied from CRM #[id number]. Topic: Clinical - Medication Refill >> Mar 25, 2024  1:46 PM Sheila Morales wrote: Medication: umeclidinium-vilanterol (ANORO ELLIPTA ) 62.5-25 MCG/ACT AEPB  Has the patient contacted their pharmacy? Yes (Agent: If no, request that the patient contact the pharmacy for the refill. If patient does not wish to contact the pharmacy document the reason why and proceed with request.) (Agent: If yes, when and what did the pharmacy advise?)  This is the patient's preferred pharmacy:  [place] - [facility] [address] [place] [address] Phone: [phone] Fax: [phone]  Is this the correct pharmacy for this prescription? Yes If no, delete pharmacy and type the correct one.   Has the prescription been filled recently? No  Is the patient out of the medication? Yes  Has the patient been seen for an appointment in the last year OR does the patient have an upcoming appointment? Yes  Can we respond through MyChart? Yes  Agent: Please be advised that Rx refills may take up to 3 business days. We ask that you follow-up with your pharmacy.

## 2024-03-29 ENCOUNTER — Other Ambulatory Visit (HOSPITAL_COMMUNITY): Payer: Self-pay

## 2024-03-29 ENCOUNTER — Other Ambulatory Visit: Payer: Self-pay

## 2024-03-29 MED ORDER — UMECLIDINIUM-VILANTEROL 62.5-25 MCG/ACT IN AEPB
1.0000 | INHALATION_SPRAY | Freq: Every day | RESPIRATORY_TRACT | 4 refills | Status: AC
  Filled 2024-03-29: qty 60, 30d supply, fill #0
  Filled 2024-04-27 (×2): qty 60, 30d supply, fill #1
  Filled 2024-05-26: qty 60, 30d supply, fill #2

## 2024-03-29 MED FILL — Umeclidinium-Vilanterol Aero Powd BA 62.5-25 MCG/ACT: RESPIRATORY_TRACT | 30 days supply | Qty: 60 | Fill #1 | Status: CN

## 2024-03-29 NOTE — Telephone Encounter (Signed)
 Patient aware. Refill sent.NFN

## 2024-03-30 ENCOUNTER — Other Ambulatory Visit: Payer: Self-pay

## 2024-04-01 ENCOUNTER — Ambulatory Visit: Admitting: Pulmonary Disease

## 2024-04-01 ENCOUNTER — Ambulatory Visit

## 2024-04-01 ENCOUNTER — Encounter: Payer: Self-pay | Admitting: Pulmonary Disease

## 2024-04-01 ENCOUNTER — Other Ambulatory Visit: Payer: Self-pay

## 2024-04-01 DIAGNOSIS — J849 Interstitial pulmonary disease, unspecified: Secondary | ICD-10-CM

## 2024-04-01 DIAGNOSIS — J449 Chronic obstructive pulmonary disease, unspecified: Secondary | ICD-10-CM

## 2024-04-01 DIAGNOSIS — F17210 Nicotine dependence, cigarettes, uncomplicated: Secondary | ICD-10-CM

## 2024-04-01 DIAGNOSIS — J9601 Acute respiratory failure with hypoxia: Secondary | ICD-10-CM | POA: Diagnosis not present

## 2024-04-01 DIAGNOSIS — J209 Acute bronchitis, unspecified: Secondary | ICD-10-CM

## 2024-04-01 LAB — PULMONARY FUNCTION TEST
DL/VA % pred: 49 %
DL/VA: 2.07 ml/min/mmHg/L
DLCO unc % pred: 34 %
DLCO unc: 6.11 ml/min/mmHg
FEF 25-75 Pre: 0.57 L/s
FEF2575-%Pred-Pre: 35 %
FEV1-%Pred-Pre: 60 %
FEV1-Pre: 1.19 L
FEV1FVC-%Pred-Pre: 82 %
FEV6-%Pred-Pre: 77 %
FEV6-Pre: 1.93 L
FEV6FVC-%Pred-Pre: 105 %
FVC-%Pred-Pre: 73 %
FVC-Pre: 1.93 L
Pre FEV1/FVC ratio: 62 %
Pre FEV6/FVC Ratio: 100 %
RV % pred: 119 %
RV: 2.59 L
TLC % pred: 96 %
TLC: 4.57 L

## 2024-04-01 MED ORDER — ALBUTEROL SULFATE HFA 108 (90 BASE) MCG/ACT IN AERS
2.0000 | INHALATION_SPRAY | Freq: Four times a day (QID) | RESPIRATORY_TRACT | 12 refills | Status: AC | PRN
  Filled 2024-04-01: qty 6.7, 17d supply, fill #0

## 2024-04-01 NOTE — Patient Instructions (Signed)
 Full pft w/o post performed today

## 2024-04-01 NOTE — Progress Notes (Unsigned)
 @Patient  ID: Sheila Morales, female    DOB: 1950/04/24, 73 y.o.   MRN: [id number]  Chief Complaint  Patient presents with   Follow-up    Follow up pft     Referring provider: Orlie Madelin RAMAN, NP  HPI:   73 y.o. 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  Most recent cardiology note reviewed.    Overall doing well.  Dyspnea stable.  No worse.  Overall feels well.  Repeat PFTs performed today.  Is reviewed with her.  These are stable over 3 years.  DLCO noted reduced but this is underestimated given very low volume used to calculate compared to total lung capacity measured and lung volumes.  We discussed possible pulmonary hypertension.  Given her lack of symptoms deferred further workup at this time.  HPI at initial visit: Briefly, history of infiltrates in the past with area of posterior right upper lobe scarring.  This predated her amiodarone .  Also on methotrexate .  However, was hospitalized in late 2022 with volume overload and groundglass opacities favored to be most likely related to pulmonary edema.  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  She was placed on prednisone  taper at discharge.  At the end of her prednisone , she developed dyspnea.  This worsened over the course of a few days.  Developed fever, came to hospital.  Was found to be hypoxemic.  Coarse interstitial infiltrates on chest x-ray on my review and interpretation.  CT chest showed worsening area of fibrotic changes in the posterior right upper lobe as well as scattered groundglass.  At time of evaluation he is on 5 L nasal cannula with accessory muscle use.  Felt too ill to safely tolerate bronchoscopy.  Recommend ongoing diuresis but started prednisone  half a milligram per kilogram, 40 mg daily.  After 1 dose her fever subsided, her  hypoxemia resolved, her dyspnea exertion went away.  She had peripheral eosinophilia 400-500.  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   Questionaires / Pulmonary Flowsheets:   ACT:      No data to display          MMRC:     No data to display          Epworth:      No data to display          Tests:   FENO:  No results found for: NITRICOXIDE  PFT:    Latest Ref Rng & Units 04/01/2024    1:43 PM 08/15/2022    3:53 PM 02/06/2017    3:09 PM  PFT Results  FVC-Pre L 1.93  P 1.81  2.14   FVC-Predicted Pre % 73  P 67  72   FVC-Post L  1.77  2.22   FVC-Predicted Post %  65  74   Pre FEV1/FVC % % 62  P 66  60   Post FEV1/FCV % %  55  59   FEV1-Pre L 1.19  P 1.19  1.28   FEV1-Predicted Pre % 60  P 58  56   FEV1-Post L  0.97  1.30   DLCO uncorrected ml/min/mmHg 6.11  P 12.08  12.45   DLCO UNC% %  34  P 66  54   DLCO corrected ml/min/mmHg  12.08  12.34   DLCO COR %Predicted %  66  53   DLVA Predicted % 49  P 79  66   TLC L 4.57  P 4.17  4.65   TLC % Predicted % 96  P 87  94   RV % Predicted % 119  P 95  107     P Preliminary result  Personally reviewed and interpreted as spirometry consistent with very mild obstruction, months lung volumes consistent with air trapping, DLCO severely reduced, with compared to prior values in 2024 and 2018, spirometry and lung volumes stable with overall numbers  WALK:      No data to display          Imaging: Personally reviewed and as per EMR discussed in this note No results found.  Lab Results: Personally reviewed CBC    Component Value Date/Time   WBC 6.6 10/25/2023 0523   RBC 4.02 10/25/2023 0523   HGB 12.4 10/25/2023 0523   HGB 13.3 04/03/2023 1212   HGB 13.1 01/13/2009 1108   HCT 36.7 10/25/2023 0523   HCT 42.1 04/03/2023 1212   HCT  37.5 01/13/2009 1108   PLT 176 10/25/2023 0523   PLT 187 04/03/2023 1212   MCV 91.3 10/25/2023 0523   MCV 92 04/03/2023 1212   MCV 86.2 01/13/2009 1108   MCH 30.8 10/25/2023 0523   MCHC 33.8 10/25/2023 0523   RDW 13.7 10/25/2023 0523   RDW 12.6 04/03/2023 1212   RDW 12.2 01/13/2009 1108   LYMPHSABS 1.0 03/02/2022 0306   LYMPHSABS 1.2 05/11/2020 1203   LYMPHSABS 2.2 01/13/2009 1108   MONOABS 1.0 03/02/2022 0306   MONOABS 0.7 01/13/2009 1108   EOSABS 0.0 03/02/2022 0306   EOSABS 0.1 05/11/2020 1203   BASOSABS 0.0 03/02/2022 0306   BASOSABS 0.0 05/11/2020 1203   BASOSABS 0.0 01/13/2009 1108    BMET    Component Value Date/Time   NA 136 10/25/2023 0523   NA 135 04/03/2023 1212   K 5.0 10/25/2023 0523   CL 104 10/25/2023 0523   CO2 23 10/25/2023 0523   GLUCOSE 126 (H) 10/25/2023 0523   BUN 32 (H) 10/25/2023 0523   BUN 30 (H) 04/03/2023 1212   CREATININE 1.12 (H) 10/25/2023 0523   CREATININE 0.91 11/22/2019 1502   CALCIUM  8.8 (L) 10/25/2023 0523   GFRNONAA 52 (L) 10/25/2023 0523   GFRAA 89 05/11/2020 1203    BNP    Component Value Date/Time   BNP 864.4 (H) 10/24/2023 1047    ProBNP    Component Value Date/Time   PROBNP 1,033.0 (H) 07/09/2021 1242    Specialty Problems       Pulmonary Problems   COPD (chronic obstructive pulmonary disease) (HCC)   Interstitial lung disease (HCC)   Snoring   Acute hypoxemic respiratory failure (HCC)   COPD with acute exacerbation (HCC)   PNA (pneumonia)    No Known Allergies  Immunization History  Administered Date(s) Administered   Fluad Quad(high Dose 65+) 03/29/2019   H1N1 05/31/2008   INFLUENZA, HIGH DOSE SEASONAL PF 02/19/2016, 02/05/2018   Influenza Split 02/13/2012   Influenza Whole 01/21/2008, 01/17/2009   Influenza,inj,Quad PF,6+ Mos 01/21/2014   Influenza-Unspecified 02/02/2015, 02/03/2017   Pneumococcal Conjugate-13 02/19/2016   Pneumococcal Polysaccharide-23 02/24/2017   Tdap 03/29/2019     Past Medical History:  Diagnosis Date   A-fib (HCC)    Acute respiratory failure with hypoxia (HCC) 11/05/2017  Arthritis    Cervicalgia    CHF (congestive heart failure) (HCC)    Colitis, ischemic    COPD (chronic obstructive pulmonary disease) (HCC)    External hemorrhoids    H/O: rheumatic fever    Heart disease    IBS (irritable bowel syndrome)    Lupus (systemic lupus erythematosus) (HCC)    NAFLD (nonalcoholic fatty liver disease)    Osteoporosis    Pacemaker    Personal history colonic adenoma 03/25/2008   06/2007 right sided adenoma and 2 right hyperplastic polyps     Restless leg syndrome    questionable   Sleep apnea    Systemic lupus erythematosus (HCC)    Tubular adenoma of colon    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     Tobacco History: Social History   Tobacco Use  Smoking Status Some Days   Current packs/day: 0.00   Average packs/day: 0.3 packs/day for 30.0 years (7.5 ttl pk-yrs)   Types: Cigarettes   Start date: 01/20/1990   Last attempt to quit: 01/21/2020   Years since quitting: 4.1  Smokeless Tobacco Never  Tobacco Comments   2 a day 04/01/2024   Ready to quit: Not Answered Counseling given: Not Answered Tobacco comments: 2 a day 04/01/2024   Continue to not smoke  Outpatient Encounter Medications as of 04/01/2024  Medication Sig   acetaminophen  (TYLENOL ) 325 MG tablet Take 650 mg by mouth every 6 (six) hours as needed for moderate pain or mild pain.   apixaban  (ELIQUIS ) 5 MG TABS tablet Take 1 tablet (5 mg total) by mouth 2 (two) times daily.   Bacillus Coagulans-Inulin (PROBIOTIC-PREBIOTIC PO) Take 1 capsule by mouth daily at 12 noon. Bio Complete   benzonatate  (TESSALON ) 100 MG capsule Take 1 capsule (100 mg total) by mouth 3 (three) times daily as needed for cough.   Cholecalciferol  (VITAMIN D ) 50 MCG (2000 UT) tablet Take 4,000 Units by mouth every evening.   DM-APAP-CPM (CORICIDIN HBP PO) Take 5  mLs by mouth every 6 (six) hours as needed (congestion).   empagliflozin  (JARDIANCE ) 10 MG TABS tablet Take 1 tablet (10 mg total) by mouth daily before breakfast.   fluticasone  (FLONASE ) 50 MCG/ACT nasal spray SPRAY 2 SPRAYS INTO EACH NOSTRIL EVERY DAY   furosemide  (LASIX ) 20 MG tablet TAKE 1 TABLET (20 MG) BY MOUTH DAILY, TAKE 1 EXTRA TABLET BY MOUTH (20 MG) AS NEEDED FOR INCREASED SWELLING.   gabapentin  (NEURONTIN ) 300 MG capsule Take 2 capsules (600 mg total) by mouth in the morning AND 2 capsules (600 mg total) every evening AND 3 capsules (900 mg total) at bedtime.   guaiFENesin  (MUCINEX ) 600 MG 12 hr tablet Take 600 mg by mouth 2 (two) times daily as needed (congestion).   JUBBONTI 60 MG/ML SOSY injection Inject 60 mg into the skin every 6 (six) months.   levocetirizine (XYZAL ) 5 MG tablet Take 1 tablet (5 mg total) by mouth daily in the evening.   MAGNESIUM  COMPLEX HIGH POTENCY PO Take 250 mg by mouth.   metoprolol  succinate (TOPROL -XL) 25 MG 24 hr tablet Take 1 tablet (25 mg total) by mouth daily.   Misc Natural Products (ADVANCED JOINT RELIEF) CAPS Take 1 capsule by mouth daily. Joint Advantage Gold 5x   Multiple Vitamin (MULTIVITAMIN WITH MINERALS) TABS tablet Take 1 tablet by mouth daily.   predniSONE  (DELTASONE ) 20 MG tablet As needed if bronchitis recurs. Take 40 mg once daily for 5 days.   RESTASIS  0.05 % ophthalmic emulsion  Place 1 drop into both eyes 3 (three) times daily.   triamcinolone  cream (KENALOG ) 0.1 % Apply 1 Application topically daily as needed (irritation).   TURMERIC CURCUMIN PO Take 1 capsule by mouth daily at 12 noon.   umeclidinium-vilanterol (ANORO ELLIPTA ) 62.5-25 MCG/ACT AEPB Inhale 1 puff into the lungs daily.   vitamin C  (ASCORBIC ACID ) 500 MG tablet Take 1,000 mg by mouth daily.   VITAMIN E  PO Take 150 mg by mouth daily.   zolpidem  (AMBIEN ) 5 MG tablet Take 1 tablet (5 mg total) by mouth at bedtime as needed.   [DISCONTINUED] albuterol   (VENTOLIN  HFA) 108 (90 Base) MCG/ACT inhaler Inhale 1-2 puffs into the lungs every 6 (six) hours as needed for wheezing or shortness of breath.   [DISCONTINUED] denosumab (PROLIA) 60 MG/ML SOSY injection Inject 60 mg into the skin every 6 (six) months.   albuterol  (VENTOLIN  HFA) 108 (90 Base) MCG/ACT inhaler Inhale 2 puffs into the lungs every 6 (six) hours as needed for wheezing or shortness of breath.   [DISCONTINUED] apixaban  (ELIQUIS ) 5 MG TABS tablet Take 1 tablet (5 mg total) by mouth 2 (two) times daily.   [DISCONTINUED] benzonatate  (TESSALON ) 100 MG capsule Take 1 capsule (100 mg total) by mouth 3 (three) times daily as needed.   [DISCONTINUED] gabapentin  (NEURONTIN ) 300 MG capsule TAKE 2 CAPSULES BY MOUTH EVERY MORNING, 2 CAPSULES EACH AFTERNOON AND 3 CAPSULES AT BEDTIME   [DISCONTINUED] levocetirizine (XYZAL ) 5 MG tablet SMARTSIG:1 Tablet(s) By Mouth Every Evening   [DISCONTINUED] umeclidinium-vilanterol (ANORO ELLIPTA ) 62.5-25 MCG/ACT AEPB Inhale 1 puff into the lungs daily.   [DISCONTINUED] zolpidem  (AMBIEN ) 5 MG tablet TAKE 1 TABLET BY MOUTH AT BEDTIME AS NEEDED FOR SLEEP   [DISCONTINUED] zolpidem  (AMBIEN ) 5 MG tablet Take 1 tablet (5 mg total) by mouth at bedtime as needed.   No facility-administered encounter medications on file as of 04/01/2024.     Review of Systems  Review of Systems  N/a Physical Exam  BP 98/60   Pulse 70   Temp 98.1 F (36.7 C) (Oral)   Ht 5' 2 (1.575 m)   Wt 132 lb 3.2 oz (60 kg)   SpO2 94%   BMI 24.18 kg/m   Wt Readings from Last 5 Encounters:  04/01/24 132 lb 3.2 oz (60 kg)  01/16/24 126 lb (57.2 kg)  01/12/24 126 lb 6.4 oz (57.3 kg)  11/27/23 122 lb (55.3 kg)  10/25/23 127 lb 13.9 oz (58 kg)    BMI Readings from Last 5 Encounters:  04/01/24 24.18 kg/m  01/16/24 23.05 kg/m  01/12/24 23.12 kg/m  11/27/23 22.31 kg/m  10/25/23 23.39 kg/m     Physical Exam General: Sitting in chair, appears drowsy Pulmonary:  clear,  good air movement Cardiovascular: Regular in rhythm Neuro: Normal gait, no focal weakness on exam   Assessment & Plan:   Acute hypoxemic respiratory failure with progressive chronic pulmonary infiltrates with worsened fever and cough: Concerning for underlying inflammatory lung disease, favor NSIP given pattern and groundglass opacities on imaging on admission.  Suspect prior infiltrates 03/2021 reflective of this process as well as volume overload given bilateral pleural effusions now improved.  Small area of what looks like fibrosis in the right upper lobe in 03/21/2021.  Overall pattern improved from last admission but with progressive fibrosis particular in the right upper lobe as well as bronchiectasis and bilateral but right greater than left lower lobes.  No clear signs of pulmonary infection on CT scan on my interpretation.  Fear  this is early fibrosis and signs of volume loss.  She had fever, productive cough worsening hypoxemia in the setting of steroid taper.  Procalcitonin was negative.  The differential also includes methotrexate  lung toxicity although the pattern does not appear classic for this.  In addition, amiodarone  toxicity is possible but given initial area of fibrosis predates amiodarone  and the relative short duration of amiodarone  administration this is felt unlikely.  She had peripheral eosinophilia of 400 on admission.  Marked improvement and essentially resolution of fever, hypoxemia, dyspnea, cough after 1 dose of prednisone  40 mg daily.  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  Gradual improvement in symptoms with prednisone  taper, chest x-ray improving 05/12/2021.  Breathing suddenly worsen later early spring 2023 felt to be largely related to volume overload given elevated BNP and improvement with diuresis.  Subsequent hospitalization 01/2022 again in the setting of volume overload.  Now felt to be euvolemic.  Symptoms improving with volume control  and gradual exercise increase.  dCHF: With volume overload being bigger problem from respiratory standpoint. Now doing well. Euvolemic.  Interstitial lung disease: Upper lobe dominant fibrotic changes, groundglass in the past.  Suspicion for NSIP.  Discussed role and rationale for antifibrotic's additional work-up for possible further immunosuppression.  She has upcoming follow-up with rheumatology.  She defers antifibrotic's as she feels well, doing as well as she has in many months.  Recent DLCO actually improved since 2018.  This is encouraging.  Moderate severe COPD: Based on PTs 2018.  Repeat 2024 with improved ratio but still moderate COPD.  Trial Trelegy without real improvement.  Continue Anoro, refilled today.  No follow-ups on file.   Sheila JONELLE Beals, MD 04/01/2024

## 2024-04-01 NOTE — Progress Notes (Signed)
 Full pft w/o post performed today

## 2024-04-02 ENCOUNTER — Encounter: Payer: Self-pay | Admitting: Pulmonary Disease

## 2024-04-02 ENCOUNTER — Other Ambulatory Visit: Payer: Self-pay | Admitting: Internal Medicine

## 2024-04-04 DIAGNOSIS — R400 Somnolence: Secondary | ICD-10-CM | POA: Diagnosis not present

## 2024-04-04 DIAGNOSIS — G4733 Obstructive sleep apnea (adult) (pediatric): Secondary | ICD-10-CM | POA: Diagnosis not present

## 2024-04-07 ENCOUNTER — Other Ambulatory Visit (HOSPITAL_BASED_OUTPATIENT_CLINIC_OR_DEPARTMENT_OTHER): Payer: Self-pay

## 2024-04-08 ENCOUNTER — Other Ambulatory Visit (HOSPITAL_COMMUNITY): Payer: Self-pay

## 2024-04-08 MED ORDER — CYCLOSPORINE 0.05 % OP EMUL
1.0000 [drp] | Freq: Two times a day (BID) | OPHTHALMIC | 2 refills | Status: AC
  Filled 2024-04-08: qty 60, 30d supply, fill #0
  Filled 2024-05-02: qty 60, 30d supply, fill #1

## 2024-04-12 ENCOUNTER — Other Ambulatory Visit: Payer: Self-pay

## 2024-04-12 ENCOUNTER — Other Ambulatory Visit (HOSPITAL_COMMUNITY): Payer: Self-pay

## 2024-04-12 MED ORDER — LEVOCETIRIZINE DIHYDROCHLORIDE 5 MG PO TABS
5.0000 mg | ORAL_TABLET | Freq: Every evening | ORAL | 3 refills | Status: AC
  Filled 2024-04-12: qty 90, 90d supply, fill #0

## 2024-04-13 ENCOUNTER — Other Ambulatory Visit: Payer: Self-pay

## 2024-04-14 ENCOUNTER — Telehealth: Payer: Self-pay | Admitting: *Deleted

## 2024-04-14 NOTE — Telephone Encounter (Signed)
 Copied from CRM [id number]. Topic: Clinical - Order For Equipment >> Apr 14, 2024 10:06 AM Sheila Morales wrote: Reason for CRM: pt called in stating [facility] is requesting Morales doctor's note and other documentation for CPAP machine otherwise insurance wont pay for it - please call back to for update, pt is not really sure what they are requesting or what she needs to do - states they gave her Morales deadline of 05/02/24  Called [facility] and spoke with rep  She states pt is needing appt for f/u with Dr Annella  I was transferred to [facility] to help with letting us  know if there is Morales specific time frame in which we need to get her in  Had to [facility]

## 2024-04-19 ENCOUNTER — Telehealth: Payer: Self-pay

## 2024-04-19 NOTE — Telephone Encounter (Signed)
" °  Spoke with patient Vbu, spoke with [facility] Cpap f/u has been scheduled   -NFN    Copied from CRM [id number]. Topic: Clinical - Order For Equipment >> Apr 14, 2024 10:06 AM Ismael A wrote: Reason for CRM: pt called in stating [facility] is requesting a doctor's note and other documentation for CPAP machine otherwise insurance wont pay for it - please call back to for update, pt is not really sure what they are requesting or what she needs to do - states they gave her a deadline of 05/02/24 >> Apr 16, 2024  8:36 AM Leila BROCKS wrote: Abigail from [facility] [phone] returning the office/Leslie's call. Abigail will not provide any details. Informed Kirsten the office is close, Abigail states she will call back and hung up on the phone call.  "

## 2024-04-26 ENCOUNTER — Telehealth: Payer: Self-pay | Admitting: Emergency Medicine

## 2024-04-26 NOTE — Telephone Encounter (Signed)
 Pt is scheduled for appt 04/27/24.

## 2024-04-26 NOTE — Telephone Encounter (Signed)
 Per Glade at [facility]- we had a message from [place] from your office asking when the patients follow-up appt needed to be done by.  This appt needs done by 05/03/2024 if possible. Thank you

## 2024-04-27 ENCOUNTER — Other Ambulatory Visit (HOSPITAL_BASED_OUTPATIENT_CLINIC_OR_DEPARTMENT_OTHER): Payer: Self-pay

## 2024-04-27 ENCOUNTER — Other Ambulatory Visit (HOSPITAL_COMMUNITY): Payer: Self-pay

## 2024-04-27 ENCOUNTER — Encounter: Payer: Self-pay | Admitting: Pulmonary Disease

## 2024-04-27 ENCOUNTER — Ambulatory Visit: Admitting: Pulmonary Disease

## 2024-04-27 ENCOUNTER — Other Ambulatory Visit: Payer: Self-pay

## 2024-04-27 VITALS — BP 118/70 | HR 58 | Temp 97.6°F | Resp 18 | Ht 62.0 in | Wt 129.4 lb

## 2024-04-27 DIAGNOSIS — J9601 Acute respiratory failure with hypoxia: Secondary | ICD-10-CM

## 2024-04-27 DIAGNOSIS — J449 Chronic obstructive pulmonary disease, unspecified: Secondary | ICD-10-CM | POA: Diagnosis not present

## 2024-04-27 DIAGNOSIS — J849 Interstitial pulmonary disease, unspecified: Secondary | ICD-10-CM | POA: Diagnosis not present

## 2024-04-27 DIAGNOSIS — G4700 Insomnia, unspecified: Secondary | ICD-10-CM | POA: Diagnosis not present

## 2024-04-27 DIAGNOSIS — F17210 Nicotine dependence, cigarettes, uncomplicated: Secondary | ICD-10-CM

## 2024-04-27 DIAGNOSIS — G4733 Obstructive sleep apnea (adult) (pediatric): Secondary | ICD-10-CM | POA: Diagnosis not present

## 2024-04-27 NOTE — Progress Notes (Signed)
 "  @Patient  ID: Sheila Morales, female    DOB: 1950-10-03, 74 y.o.   MRN: [id number]  Chief Complaint  Patient presents with   Follow-up    No complaints at this time    Referring provider: Clarice Nottingham, MD  HPI:   74 y.o. 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  Multiple telephone encounters from our office reviewed in the interim.  Overall doing well.  Returns to clinic for CPAP adherence visit.  She is using the machine regularly.  Feels good with it.  No issues with mask etc.  Having difficulty sleeping.  Falling asleep at times.  Using Ambien .  But still 2 nights a week falling asleep well after midnight.  Hours after trying to go to bed.  HPI at initial visit: Briefly, history of infiltrates in the past with area of posterior right upper lobe scarring.  This predated her amiodarone .  Also on methotrexate .  However, was hospitalized in late 2022 with volume overload and groundglass opacities favored to be most likely related to pulmonary edema.  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  She was placed on prednisone  taper at discharge.  At the end of her prednisone , she developed dyspnea.  This worsened over the course of a few days.  Developed fever, came to hospital.  Was found to be hypoxemic.  Coarse interstitial infiltrates on chest x-ray on my review and interpretation.  CT chest showed worsening area of fibrotic changes in the posterior right upper lobe as well as scattered groundglass.  At time of evaluation he is on 5 L nasal cannula with accessory muscle use.  Felt too ill to safely tolerate bronchoscopy.  Recommend ongoing diuresis but started prednisone  half a milligram per kilogram, 40 mg daily.  After 1 dose her fever subsided, her hypoxemia resolved, her dyspnea exertion went away.  She had  peripheral eosinophilia 400-500.  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   Questionaires / Pulmonary Flowsheets:   ACT:      No data to display          MMRC:     No data to display          Epworth:      No data to display          Tests:   FENO:  No results found for: NITRICOXIDE  PFT:    Latest Ref Rng & Units 04/01/2024    1:43 PM 08/15/2022    3:53 PM 02/06/2017    3:09 PM  PFT Results  FVC-Pre L 1.93  1.81  2.14   FVC-Predicted Pre % 73  67  72   FVC-Post L  1.77  2.22   FVC-Predicted Post %  65  74   Pre FEV1/FVC % % 62  66  60   Post FEV1/FCV % %  55  59   FEV1-Pre L 1.19  1.19  1.28   FEV1-Predicted Pre % 60  58  56   FEV1-Post L  0.97  1.30   DLCO uncorrected ml/min/mmHg 6.11  12.08  12.45   DLCO UNC% % 34  66  54   DLCO corrected ml/min/mmHg  12.08  12.34   DLCO COR %Predicted %  66  53   DLVA Predicted % 49  79  66   TLC L 4.57  4.17  4.65   TLC % Predicted % 96  87  94   RV % Predicted % 119  95  107   Personally reviewed and interpreted as spirometry consistent with very mild obstruction, months lung volumes consistent with air trapping, DLCO severely reduced, with compared to prior values in 2024 and 2018, spirometry and lung volumes stable with overall numbers  WALK:      No data to display          Imaging: Personally reviewed and as per EMR discussed in this note No results found.  Lab Results: Personally reviewed CBC    Component Value Date/Time   WBC 6.6 10/25/2023 0523   RBC 4.02 10/25/2023 0523   HGB 12.4 10/25/2023 0523   HGB 13.3 04/03/2023 1212   HGB 13.1 01/13/2009 1108   HCT 36.7 10/25/2023 0523   HCT 42.1 04/03/2023 1212   HCT 37.5 01/13/2009 1108   PLT 176 10/25/2023 0523   PLT 187 04/03/2023 1212   MCV 91.3 10/25/2023 0523   MCV 92  04/03/2023 1212   MCV 86.2 01/13/2009 1108   MCH 30.8 10/25/2023 0523   MCHC 33.8 10/25/2023 0523   RDW 13.7 10/25/2023 0523   RDW 12.6 04/03/2023 1212   RDW 12.2 01/13/2009 1108   LYMPHSABS 1.0 03/02/2022 0306   LYMPHSABS 1.2 05/11/2020 1203   LYMPHSABS 2.2 01/13/2009 1108   MONOABS 1.0 03/02/2022 0306   MONOABS 0.7 01/13/2009 1108   EOSABS 0.0 03/02/2022 0306   EOSABS 0.1 05/11/2020 1203   BASOSABS 0.0 03/02/2022 0306   BASOSABS 0.0 05/11/2020 1203   BASOSABS 0.0 01/13/2009 1108    BMET    Component Value Date/Time   NA 136 10/25/2023 0523   NA 135 04/03/2023 1212   K 5.0 10/25/2023 0523   CL 104 10/25/2023 0523   CO2 23 10/25/2023 0523   GLUCOSE 126 (H) 10/25/2023 0523   BUN 32 (H) 10/25/2023 0523   BUN 30 (H) 04/03/2023 1212   CREATININE 1.12 (H) 10/25/2023 0523   CREATININE 0.91 11/22/2019 1502   CALCIUM  8.8 (L) 10/25/2023 0523   GFRNONAA 52 (L) 10/25/2023 0523   GFRAA 89 05/11/2020 1203    BNP    Component Value Date/Time   BNP 864.4 (H) 10/24/2023 1047    ProBNP    Component Value Date/Time   PROBNP 1,033.0 (H) 07/09/2021 1242    Specialty Problems       Pulmonary Problems   COPD (chronic obstructive pulmonary disease) (HCC)   Interstitial lung disease (HCC)   Snoring   Acute hypoxemic respiratory failure (HCC)   COPD with acute exacerbation (HCC)   PNA (pneumonia)    No Known Allergies  Immunization History  Administered Date(s) Administered   Fluad Quad(high Dose 65+) 03/29/2019   H1N1 05/31/2008   INFLUENZA, HIGH DOSE SEASONAL PF 02/19/2016, 02/05/2018   Influenza Split 02/13/2012   Influenza Whole 01/21/2008, 01/17/2009   Influenza,inj,Quad PF,6+ Mos 01/21/2014   Influenza-Unspecified 02/02/2015, 02/03/2017   Pneumococcal Conjugate-13 02/19/2016   Pneumococcal Polysaccharide-23 02/24/2017   Tdap 03/29/2019    Past Medical History:  Diagnosis Date   A-fib (HCC)    Acute respiratory failure with hypoxia (HCC) 11/05/2017    Arthritis    Cervicalgia    CHF (congestive heart failure) (HCC)    Colitis, ischemic    COPD (  chronic obstructive pulmonary disease) (HCC)    External hemorrhoids    H/O: rheumatic fever    Heart disease    IBS (irritable bowel syndrome)    Lupus (systemic lupus erythematosus) (HCC)    NAFLD (nonalcoholic fatty liver disease)    Osteoporosis    Pacemaker    Personal history colonic adenoma 03/25/2008   06/2007 right sided adenoma and 2 right hyperplastic polyps     Restless leg syndrome    questionable   Sleep apnea    Systemic lupus erythematosus (HCC)    Tubular adenoma of colon    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     Tobacco History: Social History   Tobacco Use  Smoking Status Some Days   Current packs/day: 0.00   Average packs/day: 0.3 packs/day for 30.0 years (7.5 ttl pk-yrs)   Types: Cigarettes   Start date: 01/20/1990   Last attempt to quit: 01/21/2020   Years since quitting: 4.2  Smokeless Tobacco Never  Tobacco Comments   2 a day 04/01/2024   Ready to quit: Not Answered Counseling given: Not Answered Tobacco comments: 2 a day 04/01/2024   Continue to not smoke  Outpatient Encounter Medications as of 04/27/2024  Medication Sig   acetaminophen  (TYLENOL ) 325 MG tablet Take 650 mg by mouth every 6 (six) hours as needed for moderate pain or mild pain.   albuterol  (VENTOLIN  HFA) 108 (90 Base) MCG/ACT inhaler Inhale 2 puffs into the lungs every 6 (six) hours as needed for wheezing or shortness of breath.   apixaban  (ELIQUIS ) 5 MG TABS tablet Take 1 tablet (5 mg total) by mouth 2 (two) times daily.   Bacillus Coagulans-Inulin (PROBIOTIC-PREBIOTIC PO) Take 1 capsule by mouth daily at 12 noon. Bio Complete   benzonatate  (TESSALON ) 100 MG capsule Take 1 capsule (100 mg total) by mouth 3 (three) times daily as needed for cough.   Cholecalciferol  (VITAMIN D ) 50 MCG (2000 UT) tablet Take 4,000 Units by mouth every evening.   cycloSPORINE  (RESTASIS ) 0.05 % ophthalmic  emulsion INSTILL 1 DROP INTO BOTH EYES TWICE A DAY   DM-APAP-CPM (CORICIDIN HBP PO) Take 5 mLs by mouth every 6 (six) hours as needed (congestion).   empagliflozin  (JARDIANCE ) 10 MG TABS tablet Take 1 tablet (10 mg total) by mouth daily before breakfast.   fluticasone  (FLONASE ) 50 MCG/ACT nasal spray SPRAY 2 SPRAYS INTO EACH NOSTRIL EVERY DAY   furosemide  (LASIX ) 20 MG tablet TAKE 1 TABLET (20 MG) BY MOUTH DAILY, TAKE 1 EXTRA TABLET BY MOUTH (20 MG) AS NEEDED FOR INCREASED SWELLING.   gabapentin  (NEURONTIN ) 300 MG capsule Take 2 capsules (600 mg total) by mouth in the morning AND 2 capsules (600 mg total) every evening AND 3 capsules (900 mg total) at bedtime.   guaiFENesin  (MUCINEX ) 600 MG 12 hr tablet Take 600 mg by mouth 2 (two) times daily as needed (congestion).   JUBBONTI 60 MG/ML SOSY injection Inject 60 mg into the skin every 6 (six) months.   levocetirizine (XYZAL ) 5 MG tablet Take 1 tablet (5 mg total) by mouth daily in the evening.   levocetirizine (XYZAL ) 5 MG tablet Take 1 tablet (5 mg total) by mouth every evening.   MAGNESIUM  COMPLEX HIGH POTENCY PO Take 250 mg by mouth.   metoprolol  succinate (TOPROL -XL) 25 MG 24 hr tablet Take 1 tablet (25 mg total) by mouth daily.   Misc Natural Products (ADVANCED JOINT RELIEF) CAPS Take 1 capsule by mouth daily. Joint Advantage Gold 5x  Multiple Vitamin (MULTIVITAMIN WITH MINERALS) TABS tablet Take 1 tablet by mouth daily.   predniSONE  (DELTASONE ) 20 MG tablet As needed if bronchitis recurs. Take 40 mg once daily for 5 days.   RESTASIS  0.05 % ophthalmic emulsion Place 1 drop into both eyes 3 (three) times daily.   triamcinolone  cream (KENALOG ) 0.1 % Apply 1 Application topically daily as needed (irritation).   TURMERIC CURCUMIN PO Take 1 capsule by mouth daily at 12 noon.   umeclidinium-vilanterol (ANORO ELLIPTA ) 62.5-25 MCG/ACT AEPB Inhale 1 puff into the lungs daily.   vitamin C  (ASCORBIC ACID ) 500 MG tablet Take 1,000 mg by mouth daily.    VITAMIN E  PO Take 150 mg by mouth daily.   zolpidem  (AMBIEN ) 5 MG tablet Take 1 tablet (5 mg total) by mouth at bedtime as needed.   No facility-administered encounter medications on file as of 04/27/2024.     Review of Systems  Review of Systems  N/a Physical Exam  BP 118/70 (BP Location: Left Arm, Patient Position: Sitting)   Temp 97.6 F (36.4 C) (Oral)   Resp 18   Ht 5' 2 (1.575 m)   Wt 129 lb 6.4 oz (58.7 kg)   BMI 23.67 kg/m   Wt Readings from Last 5 Encounters:  04/27/24 129 lb 6.4 oz (58.7 kg)  04/01/24 132 lb 3.2 oz (60 kg)  01/16/24 126 lb (57.2 kg)  01/12/24 126 lb 6.4 oz (57.3 kg)  11/27/23 122 lb (55.3 kg)    BMI Readings from Last 5 Encounters:  04/27/24 23.67 kg/m  04/01/24 24.18 kg/m  01/16/24 23.05 kg/m  01/12/24 23.12 kg/m  11/27/23 22.31 kg/m     Physical Exam General: Sitting in chair, appears drowsy Pulmonary:  clear, good air movement Cardiovascular: Regular in rhythm Neuro: Normal gait, no focal weakness on exam   Assessment & Plan:   Acute hypoxemic respiratory failure with progressive chronic pulmonary infiltrates with worsened fever and cough: Concerning for underlying inflammatory lung disease, favor NSIP given pattern and groundglass opacities on imaging on admission.  Suspect prior infiltrates 03/2021 reflective of this process as well as volume overload given bilateral pleural effusions now improved.  Small area of what looks like fibrosis in the right upper lobe in 03/21/2021.  Overall pattern improved from last admission but with progressive fibrosis particular in the right upper lobe as well as bronchiectasis and bilateral but right greater than left lower lobes.  No clear signs of pulmonary infection on CT scan on my interpretation.  Fear this is early fibrosis and signs of volume loss.  She had fever, productive cough worsening hypoxemia in the setting of steroid taper.  Procalcitonin was negative.  The differential also includes  methotrexate  lung toxicity although the pattern does not appear classic for this.  In addition, amiodarone  toxicity is possible but given initial area of fibrosis predates amiodarone  and the relative short duration of amiodarone  administration this is felt unlikely.  She had peripheral eosinophilia of 400 on admission.  Marked improvement and essentially resolution of fever, hypoxemia, dyspnea, cough after 1 dose of prednisone  40 mg daily.  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  Gradual improvement in symptoms with prednisone  taper, chest x-ray improved 05/12/2021.  Breathing suddenly worsen later early spring 2023 felt to be largely related to volume overload given elevated BNP and improvement with diuresis.  Subsequent hospitalization 01/2022 again in the setting of volume overload.  Now felt to be euvolemic.  Symptoms improving with volume control and gradual  exercise increase.  dCHF: With volume overload being bigger problem from respiratory standpoint. Now doing well. Euvolemic.  Interstitial lung disease: Upper lobe dominant fibrotic changes, groundglass in the past.  Suspicion for NSIP.  Vs sequelae of amio toxicity. Recent CT and PFTs stable.  Moderate severe COPD: Based on PTs 2018.  Repeat 2024 with improved ratio but still moderate COPD.  Past trial of Trelegy without real improvement.  Continue Anoro daily, albuterol  PRN  OSA on CPAP: Compliance report reviewed.  Demonstrates excellent adherence using on average 6 hours at night, AHI 1.9.  Well-controlled.  She is benefiting clinically from CPAP therapy and she is encouraged to continue CPAP therapy.  Insomnia: On Ambien .  Still with difficulty falling asleep.  Will liaison with sleep colleagues to see if there is a better option.  Return in about 6 months (around 10/25/2024) for f/u Dr. Annella.   Donnice JONELLE Annella, MD 04/27/2024   "

## 2024-04-27 NOTE — Patient Instructions (Signed)
 Is good to see you again  It appears your CPAP machine is functioning well and treating your sleep apnea very effectively.  I encourage you to continue this.  Return to clinic in 6 or sooner as needed with Dr. Annella

## 2024-04-28 ENCOUNTER — Other Ambulatory Visit: Payer: Self-pay

## 2024-04-28 ENCOUNTER — Other Ambulatory Visit (HOSPITAL_COMMUNITY): Payer: Self-pay

## 2024-04-29 ENCOUNTER — Other Ambulatory Visit: Payer: Self-pay

## 2024-04-29 ENCOUNTER — Encounter: Payer: Self-pay | Admitting: Pharmacist

## 2024-04-29 ENCOUNTER — Other Ambulatory Visit (HOSPITAL_COMMUNITY): Payer: Self-pay

## 2024-04-30 ENCOUNTER — Other Ambulatory Visit: Payer: Self-pay

## 2024-04-30 ENCOUNTER — Other Ambulatory Visit (HOSPITAL_BASED_OUTPATIENT_CLINIC_OR_DEPARTMENT_OTHER): Payer: Self-pay

## 2024-05-01 ENCOUNTER — Other Ambulatory Visit: Payer: Self-pay

## 2024-05-01 ENCOUNTER — Other Ambulatory Visit (HOSPITAL_COMMUNITY): Payer: Self-pay

## 2024-05-03 ENCOUNTER — Other Ambulatory Visit: Payer: Self-pay

## 2024-05-04 ENCOUNTER — Other Ambulatory Visit (HOSPITAL_COMMUNITY): Payer: Self-pay

## 2024-05-17 ENCOUNTER — Other Ambulatory Visit (HOSPITAL_COMMUNITY): Payer: Self-pay

## 2024-05-18 ENCOUNTER — Other Ambulatory Visit: Payer: Self-pay

## 2024-05-24 ENCOUNTER — Ambulatory Visit: Payer: PPO

## 2024-05-24 DIAGNOSIS — I495 Sick sinus syndrome: Secondary | ICD-10-CM

## 2024-05-25 LAB — CUP PACEART REMOTE DEVICE CHECK
Battery Voltage: 65
Date Time Interrogation Session: 20260202110401
Implantable Lead Connection Status: 753985
Implantable Lead Connection Status: 753985
Implantable Lead Implant Date: 20220204
Implantable Lead Implant Date: 20220204
Implantable Lead Location: 753859
Implantable Lead Location: 753860
Implantable Lead Model: 377169
Implantable Lead Model: 377171
Implantable Lead Serial Number: 8000143637
Implantable Lead Serial Number: 8000151447
Implantable Pulse Generator Implant Date: 20220204
Pulse Gen Model: 407145
Pulse Gen Serial Number: 70020732

## 2024-05-26 ENCOUNTER — Other Ambulatory Visit: Payer: Self-pay

## 2024-05-28 ENCOUNTER — Ambulatory Visit: Payer: Self-pay | Admitting: Cardiology

## 2024-05-28 NOTE — Progress Notes (Signed)
 Remote PPM Transmission

## 2024-08-23 ENCOUNTER — Ambulatory Visit: Payer: PPO

## 2024-11-22 ENCOUNTER — Ambulatory Visit: Payer: PPO

## 2025-02-21 ENCOUNTER — Ambulatory Visit: Payer: PPO

## 2025-05-23 ENCOUNTER — Ambulatory Visit: Payer: PPO
